# Patient Record
Sex: Male | Born: 1940 | Race: White | Hispanic: No | Marital: Married | Smoking: Former smoker
Health system: Southern US, Community
[De-identification: ages and names within clinical notes are randomized; demographics above are authoritative.]

## PROBLEM LIST (undated history)

## (undated) DIAGNOSIS — Z87898 Personal history of other specified conditions: Secondary | ICD-10-CM

## (undated) DIAGNOSIS — E785 Hyperlipidemia, unspecified: Secondary | ICD-10-CM

## (undated) DIAGNOSIS — J189 Pneumonia, unspecified organism: Secondary | ICD-10-CM

## (undated) DIAGNOSIS — Z9861 Coronary angioplasty status: Secondary | ICD-10-CM

## (undated) DIAGNOSIS — I2510 Atherosclerotic heart disease of native coronary artery without angina pectoris: Secondary | ICD-10-CM

## (undated) DIAGNOSIS — F0390 Unspecified dementia without behavioral disturbance: Secondary | ICD-10-CM

## (undated) DIAGNOSIS — I10 Essential (primary) hypertension: Secondary | ICD-10-CM

## (undated) DIAGNOSIS — I714 Abdominal aortic aneurysm, without rupture: Secondary | ICD-10-CM

## (undated) DIAGNOSIS — I6529 Occlusion and stenosis of unspecified carotid artery: Secondary | ICD-10-CM

## (undated) DIAGNOSIS — I482 Chronic atrial fibrillation, unspecified: Secondary | ICD-10-CM

## (undated) DIAGNOSIS — I213 ST elevation (STEMI) myocardial infarction of unspecified site: Secondary | ICD-10-CM

## (undated) DIAGNOSIS — I110 Hypertensive heart disease with heart failure: Secondary | ICD-10-CM

## (undated) DIAGNOSIS — Z86010 Personal history of colonic polyps: Secondary | ICD-10-CM

## (undated) DIAGNOSIS — I5032 Chronic diastolic (congestive) heart failure: Secondary | ICD-10-CM

## (undated) DIAGNOSIS — Z87891 Personal history of nicotine dependence: Secondary | ICD-10-CM

## (undated) DIAGNOSIS — K5790 Diverticulosis of intestine, part unspecified, without perforation or abscess without bleeding: Secondary | ICD-10-CM

## (undated) DIAGNOSIS — N183 Chronic kidney disease, stage 3 unspecified: Secondary | ICD-10-CM

## (undated) DIAGNOSIS — E119 Type 2 diabetes mellitus without complications: Secondary | ICD-10-CM

## (undated) DIAGNOSIS — J449 Chronic obstructive pulmonary disease, unspecified: Secondary | ICD-10-CM

## (undated) HISTORY — DX: Personal history of colonic polyps: Z86.010

## (undated) HISTORY — DX: Personal history of nicotine dependence: Z87.891

## (undated) HISTORY — PX: CATARACT EXTRACTION: SUR2

## (undated) HISTORY — DX: Personal history of other specified conditions: Z87.898

## (undated) HISTORY — DX: Abdominal aortic aneurysm, without rupture: I71.4

## (undated) HISTORY — DX: Chronic diastolic (congestive) heart failure: I50.32

## (undated) HISTORY — DX: Hyperlipidemia, unspecified: E78.5

## (undated) HISTORY — DX: Occlusion and stenosis of unspecified carotid artery: I65.29

## (undated) HISTORY — DX: Diverticulosis of intestine, part unspecified, without perforation or abscess without bleeding: K57.90

## (undated) HISTORY — PX: ORIF SHOULDER FRACTURE: SHX5035

## (undated) HISTORY — DX: Type 2 diabetes mellitus without complications: E11.9

## (undated) HISTORY — DX: Hypertensive heart disease with heart failure: I11.0

## (undated) HISTORY — PX: COLONOSCOPY: SHX174

---

## 1998-06-28 ENCOUNTER — Encounter: Payer: Self-pay | Admitting: Emergency Medicine

## 1998-06-28 ENCOUNTER — Emergency Department (HOSPITAL_COMMUNITY): Admission: EM | Admit: 1998-06-28 | Discharge: 1998-06-28 | Payer: Self-pay | Admitting: Emergency Medicine

## 2000-04-29 ENCOUNTER — Inpatient Hospital Stay (HOSPITAL_COMMUNITY): Admission: EM | Admit: 2000-04-29 | Discharge: 2000-05-01 | Payer: Self-pay | Admitting: Emergency Medicine

## 2000-04-29 ENCOUNTER — Encounter: Payer: Self-pay | Admitting: Emergency Medicine

## 2003-12-18 ENCOUNTER — Ambulatory Visit: Payer: Self-pay | Admitting: Internal Medicine

## 2004-04-17 ENCOUNTER — Ambulatory Visit: Payer: Self-pay | Admitting: Internal Medicine

## 2005-02-02 DIAGNOSIS — I213 ST elevation (STEMI) myocardial infarction of unspecified site: Secondary | ICD-10-CM

## 2005-02-02 HISTORY — PX: CORONARY ANGIOPLASTY WITH STENT PLACEMENT: SHX49

## 2005-02-02 HISTORY — DX: ST elevation (STEMI) myocardial infarction of unspecified site: I21.3

## 2005-04-09 ENCOUNTER — Ambulatory Visit: Payer: Self-pay | Admitting: Cardiology

## 2005-04-09 ENCOUNTER — Inpatient Hospital Stay (HOSPITAL_COMMUNITY): Admission: EM | Admit: 2005-04-09 | Discharge: 2005-04-12 | Payer: Self-pay | Admitting: Emergency Medicine

## 2005-04-29 ENCOUNTER — Ambulatory Visit: Payer: Self-pay | Admitting: Cardiology

## 2005-05-14 ENCOUNTER — Encounter (HOSPITAL_COMMUNITY): Admission: RE | Admit: 2005-05-14 | Discharge: 2005-07-03 | Payer: Self-pay | Admitting: Cardiology

## 2005-05-26 ENCOUNTER — Ambulatory Visit: Payer: Self-pay | Admitting: Cardiology

## 2005-06-12 ENCOUNTER — Ambulatory Visit: Payer: Self-pay

## 2005-06-12 ENCOUNTER — Encounter: Payer: Self-pay | Admitting: Cardiovascular Disease

## 2005-06-24 ENCOUNTER — Ambulatory Visit: Payer: Self-pay | Admitting: Cardiology

## 2005-08-19 ENCOUNTER — Ambulatory Visit: Payer: Self-pay

## 2005-10-16 ENCOUNTER — Ambulatory Visit: Payer: Self-pay | Admitting: Internal Medicine

## 2005-10-26 ENCOUNTER — Ambulatory Visit: Payer: Self-pay | Admitting: Internal Medicine

## 2005-11-01 DIAGNOSIS — I2510 Atherosclerotic heart disease of native coronary artery without angina pectoris: Secondary | ICD-10-CM

## 2005-11-01 HISTORY — DX: Atherosclerotic heart disease of native coronary artery without angina pectoris: I25.10

## 2006-03-09 ENCOUNTER — Ambulatory Visit: Payer: Self-pay | Admitting: Internal Medicine

## 2006-03-30 ENCOUNTER — Ambulatory Visit: Payer: Self-pay | Admitting: Internal Medicine

## 2006-04-13 ENCOUNTER — Ambulatory Visit: Payer: Self-pay | Admitting: Cardiology

## 2006-09-20 ENCOUNTER — Encounter (INDEPENDENT_AMBULATORY_CARE_PROVIDER_SITE_OTHER): Payer: Self-pay

## 2006-10-29 DIAGNOSIS — I10 Essential (primary) hypertension: Secondary | ICD-10-CM

## 2006-10-29 DIAGNOSIS — E1151 Type 2 diabetes mellitus with diabetic peripheral angiopathy without gangrene: Secondary | ICD-10-CM

## 2006-10-29 DIAGNOSIS — E785 Hyperlipidemia, unspecified: Secondary | ICD-10-CM

## 2006-10-29 DIAGNOSIS — E119 Type 2 diabetes mellitus without complications: Secondary | ICD-10-CM

## 2006-10-29 HISTORY — DX: Type 2 diabetes mellitus without complications: E11.9

## 2006-10-29 HISTORY — DX: Hyperlipidemia, unspecified: E78.5

## 2006-11-01 ENCOUNTER — Encounter: Payer: Self-pay | Admitting: Internal Medicine

## 2006-11-02 DIAGNOSIS — I2510 Atherosclerotic heart disease of native coronary artery without angina pectoris: Secondary | ICD-10-CM | POA: Insufficient documentation

## 2006-11-02 DIAGNOSIS — Z860100 Personal history of colon polyps, unspecified: Secondary | ICD-10-CM

## 2006-11-02 DIAGNOSIS — Z9861 Coronary angioplasty status: Secondary | ICD-10-CM

## 2006-11-02 DIAGNOSIS — I252 Old myocardial infarction: Secondary | ICD-10-CM | POA: Insufficient documentation

## 2006-11-02 HISTORY — DX: Personal history of colonic polyps: Z86.010

## 2006-11-02 HISTORY — DX: Personal history of colon polyps, unspecified: Z86.0100

## 2007-02-16 ENCOUNTER — Ambulatory Visit: Payer: Self-pay | Admitting: Internal Medicine

## 2007-02-16 LAB — CONVERTED CEMR LAB
ALT: 28 units/L (ref 0–53)
AST: 22 units/L (ref 0–37)
Albumin: 3.9 g/dL (ref 3.5–5.2)
Alkaline Phosphatase: 67 units/L (ref 39–117)
BUN: 18 mg/dL (ref 6–23)
Basophils Absolute: 0.1 10*3/uL (ref 0.0–0.1)
Basophils Relative: 0.7 % (ref 0.0–1.0)
Bilirubin Urine: NEGATIVE
Bilirubin, Direct: 0.1 mg/dL (ref 0.0–0.3)
CO2: 30 meq/L (ref 19–32)
Calcium: 9.8 mg/dL (ref 8.4–10.5)
Chloride: 102 meq/L (ref 96–112)
Cholesterol: 142 mg/dL (ref 0–200)
Creatinine, Ser: 1.2 mg/dL (ref 0.4–1.5)
Creatinine,U: 167.6 mg/dL
Eosinophils Absolute: 0.2 10*3/uL (ref 0.0–0.6)
Eosinophils Relative: 2.7 % (ref 0.0–5.0)
GFR calc Af Amer: 78 mL/min
GFR calc non Af Amer: 64 mL/min
Glucose, Bld: 121 mg/dL — ABNORMAL HIGH (ref 70–99)
Glucose, Urine, Semiquant: NEGATIVE
HCT: 44.1 % (ref 39.0–52.0)
HDL: 30.9 mg/dL — ABNORMAL LOW (ref >39.0)
Hemoglobin: 15.2 g/dL (ref 13.0–17.0)
Hgb A1c MFr Bld: 6.9 % — ABNORMAL HIGH (ref 4.6–6.0)
Ketones, urine, test strip: NEGATIVE
LDL Cholesterol: 99 mg/dL (ref 0–99)
Lymphocytes Relative: 37.4 % (ref 12.0–46.0)
MCHC: 34.5 g/dL (ref 30.0–36.0)
MCV: 86.6 fL (ref 78.0–100.0)
Microalb Creat Ratio: 10.1 mg/g (ref 0.0–30.0)
Microalb, Ur: 1.7 mg/dL (ref 0.0–1.9)
Monocytes Absolute: 0.7 10*3/uL (ref 0.2–0.7)
Monocytes Relative: 8.2 % (ref 3.0–11.0)
Neutro Abs: 4.6 10*3/uL (ref 1.4–7.7)
Neutrophils Relative %: 51 % (ref 43.0–77.0)
Nitrite: NEGATIVE
PSA: 0.45 ng/mL (ref 0.10–4.00)
Platelets: 221 10*3/uL (ref 150–400)
Potassium: 4.3 meq/L (ref 3.5–5.1)
Protein, U semiquant: NEGATIVE
RBC: 5.09 M/uL (ref 4.22–5.81)
RDW: 12.4 % (ref 11.5–14.6)
Sodium: 140 meq/L (ref 135–145)
Specific Gravity, Urine: 1.025
TSH: 1.86 microintl units/mL (ref 0.35–5.50)
Total Bilirubin: 0.5 mg/dL (ref 0.3–1.2)
Total CHOL/HDL Ratio: 4.6
Total Protein: 6.9 g/dL (ref 6.0–8.3)
Triglycerides: 62 mg/dL (ref 0–149)
Urobilinogen, UA: 0.2
VLDL: 12 mg/dL (ref 0–40)
WBC Urine, dipstick: NEGATIVE
WBC: 9 10*3/uL (ref 4.5–10.5)
pH: 5.5

## 2007-02-24 ENCOUNTER — Ambulatory Visit: Payer: Self-pay | Admitting: Internal Medicine

## 2007-02-24 DIAGNOSIS — Z87898 Personal history of other specified conditions: Secondary | ICD-10-CM

## 2007-02-24 HISTORY — DX: Personal history of other specified conditions: Z87.898

## 2007-05-19 ENCOUNTER — Ambulatory Visit: Payer: Self-pay | Admitting: Internal Medicine

## 2007-05-19 LAB — CONVERTED CEMR LAB: Blood Glucose, Fingerstick: 134

## 2007-06-02 ENCOUNTER — Ambulatory Visit: Payer: Self-pay | Admitting: Internal Medicine

## 2007-06-02 LAB — CONVERTED CEMR LAB: Blood Glucose, Fingerstick: 94

## 2007-09-01 ENCOUNTER — Ambulatory Visit: Payer: Self-pay

## 2007-09-01 ENCOUNTER — Ambulatory Visit: Payer: Self-pay | Admitting: Cardiology

## 2007-09-23 ENCOUNTER — Ambulatory Visit: Payer: Self-pay

## 2007-09-28 ENCOUNTER — Ambulatory Visit: Payer: Self-pay | Admitting: Cardiology

## 2007-09-28 LAB — CONVERTED CEMR LAB
Basophils Absolute: 0 10*3/uL (ref 0.0–0.1)
CO2: 29 meq/L (ref 19–32)
Calcium: 9.4 mg/dL (ref 8.4–10.5)
Chloride: 107 meq/L (ref 96–112)
Glucose, Bld: 132 mg/dL — ABNORMAL HIGH (ref 70–99)
Hemoglobin: 15.9 g/dL (ref 13.0–17.0)
INR: 1.1 — ABNORMAL HIGH (ref 0.8–1.0)
Lymphocytes Relative: 33.1 % (ref 12.0–46.0)
MCHC: 35.7 g/dL (ref 30.0–36.0)
Monocytes Relative: 7.2 % (ref 3.0–12.0)
Neutro Abs: 5.5 10*3/uL (ref 1.4–7.7)
Neutrophils Relative %: 58.1 % (ref 43.0–77.0)
Potassium: 3.6 meq/L (ref 3.5–5.1)
Prothrombin Time: 12.7 s (ref 10.9–13.3)
RDW: 12.4 % (ref 11.5–14.6)
Sodium: 140 meq/L (ref 135–145)
aPTT: 30.4 s — ABNORMAL HIGH (ref 21.7–29.8)

## 2007-10-03 ENCOUNTER — Inpatient Hospital Stay (HOSPITAL_BASED_OUTPATIENT_CLINIC_OR_DEPARTMENT_OTHER): Admission: RE | Admit: 2007-10-03 | Discharge: 2007-10-03 | Payer: Self-pay | Admitting: Internal Medicine

## 2007-10-03 ENCOUNTER — Ambulatory Visit: Payer: Self-pay | Admitting: Internal Medicine

## 2007-10-03 ENCOUNTER — Ambulatory Visit: Payer: Self-pay | Admitting: Cardiovascular Disease

## 2007-10-03 ENCOUNTER — Inpatient Hospital Stay (HOSPITAL_COMMUNITY): Admission: AD | Admit: 2007-10-03 | Discharge: 2007-10-04 | Payer: Self-pay | Admitting: Cardiovascular Disease

## 2007-10-06 ENCOUNTER — Ambulatory Visit: Payer: Self-pay | Admitting: Cardiology

## 2007-10-06 LAB — CONVERTED CEMR LAB
CO2: 28 meq/L (ref 19–32)
Calcium: 8.9 mg/dL (ref 8.4–10.5)
Chloride: 107 meq/L (ref 96–112)
Glucose, Bld: 152 mg/dL — ABNORMAL HIGH (ref 70–99)
Potassium: 4 meq/L (ref 3.5–5.1)
Sodium: 142 meq/L (ref 135–145)

## 2007-10-12 ENCOUNTER — Ambulatory Visit: Payer: Self-pay | Admitting: Internal Medicine

## 2007-10-27 ENCOUNTER — Ambulatory Visit: Payer: Self-pay | Admitting: Cardiology

## 2008-07-07 DIAGNOSIS — R0609 Other forms of dyspnea: Secondary | ICD-10-CM | POA: Insufficient documentation

## 2008-07-07 DIAGNOSIS — R0989 Other specified symptoms and signs involving the circulatory and respiratory systems: Secondary | ICD-10-CM | POA: Insufficient documentation

## 2008-07-13 ENCOUNTER — Ambulatory Visit: Payer: Self-pay | Admitting: Cardiology

## 2008-07-13 DIAGNOSIS — I714 Abdominal aortic aneurysm, without rupture, unspecified: Secondary | ICD-10-CM

## 2008-07-13 DIAGNOSIS — I6529 Occlusion and stenosis of unspecified carotid artery: Secondary | ICD-10-CM | POA: Insufficient documentation

## 2008-07-13 HISTORY — DX: Abdominal aortic aneurysm, without rupture: I71.4

## 2008-07-13 HISTORY — DX: Occlusion and stenosis of unspecified carotid artery: I65.29

## 2008-07-13 HISTORY — DX: Abdominal aortic aneurysm, without rupture, unspecified: I71.40

## 2008-10-09 ENCOUNTER — Telehealth: Payer: Self-pay | Admitting: Cardiology

## 2008-10-30 ENCOUNTER — Encounter (INDEPENDENT_AMBULATORY_CARE_PROVIDER_SITE_OTHER): Payer: Self-pay | Admitting: *Deleted

## 2008-11-22 ENCOUNTER — Telehealth (INDEPENDENT_AMBULATORY_CARE_PROVIDER_SITE_OTHER): Payer: Self-pay | Admitting: *Deleted

## 2008-11-26 ENCOUNTER — Ambulatory Visit: Payer: Self-pay | Admitting: Cardiology

## 2008-11-26 ENCOUNTER — Ambulatory Visit: Payer: Self-pay

## 2008-11-26 ENCOUNTER — Encounter (HOSPITAL_COMMUNITY): Admission: RE | Admit: 2008-11-26 | Discharge: 2009-02-01 | Payer: Self-pay | Admitting: Cardiology

## 2008-11-26 ENCOUNTER — Encounter: Payer: Self-pay | Admitting: Cardiology

## 2008-11-27 ENCOUNTER — Encounter (INDEPENDENT_AMBULATORY_CARE_PROVIDER_SITE_OTHER): Payer: Self-pay | Admitting: *Deleted

## 2008-11-30 ENCOUNTER — Ambulatory Visit: Payer: Self-pay | Admitting: Cardiology

## 2009-11-06 ENCOUNTER — Ambulatory Visit: Payer: Self-pay | Admitting: Internal Medicine

## 2009-11-06 ENCOUNTER — Encounter: Payer: Self-pay | Admitting: Internal Medicine

## 2009-11-06 DIAGNOSIS — I482 Chronic atrial fibrillation, unspecified: Secondary | ICD-10-CM

## 2009-11-06 HISTORY — DX: Chronic atrial fibrillation, unspecified: I48.20

## 2009-11-06 LAB — CONVERTED CEMR LAB
BUN: 24 mg/dL — ABNORMAL HIGH (ref 6–23)
Basophils Absolute: 0.1 10*3/uL (ref 0.0–0.1)
Bilirubin, Direct: 0.2 mg/dL (ref 0.0–0.3)
Blood Glucose, Fingerstick: 211
Cholesterol: 142 mg/dL (ref 0–200)
Creatinine, Ser: 1.4 mg/dL (ref 0.4–1.5)
Creatinine,U: 416 mg/dL
GFR calc non Af Amer: 54.76 mL/min (ref >60)
Glucose, Bld: 199 mg/dL — ABNORMAL HIGH (ref 70–99)
HCT: 49.1 % (ref 39.0–52.0)
Hgb A1c MFr Bld: 7 % — ABNORMAL HIGH (ref 4.6–6.5)
LDL Cholesterol: 73 mg/dL (ref 0–99)
Lymphs Abs: 3 10*3/uL (ref 0.7–4.0)
MCV: 88.1 fL (ref 78.0–100.0)
Microalb Creat Ratio: 34.2 mg/g — ABNORMAL HIGH (ref 0.0–30.0)
Microalb, Ur: 142.4 mg/dL — ABNORMAL HIGH (ref 0.0–1.9)
Monocytes Absolute: 0.8 10*3/uL (ref 0.1–1.0)
Monocytes Relative: 7.8 % (ref 3.0–12.0)
Neutrophils Relative %: 59.9 % (ref 43.0–77.0)
Platelets: 217 10*3/uL (ref 150.0–400.0)
Potassium: 4.7 meq/L (ref 3.5–5.1)
RDW: 14.3 % (ref 11.5–14.6)
TSH: 1.82 microintl units/mL (ref 0.35–5.50)
Total Bilirubin: 0.8 mg/dL (ref 0.3–1.2)
Triglycerides: 103 mg/dL (ref 0.0–149.0)
VLDL: 20.6 mg/dL (ref 0.0–40.0)
WBC: 10.1 10*3/uL (ref 4.5–10.5)

## 2009-11-11 ENCOUNTER — Ambulatory Visit: Payer: Self-pay | Admitting: Internal Medicine

## 2009-11-22 ENCOUNTER — Telehealth (INDEPENDENT_AMBULATORY_CARE_PROVIDER_SITE_OTHER): Payer: Self-pay | Admitting: *Deleted

## 2009-11-25 ENCOUNTER — Ambulatory Visit: Payer: Self-pay | Admitting: Internal Medicine

## 2009-11-27 ENCOUNTER — Encounter: Payer: Self-pay | Admitting: Physician Assistant

## 2009-11-27 ENCOUNTER — Ambulatory Visit: Payer: Self-pay | Admitting: Cardiovascular Disease

## 2009-12-02 ENCOUNTER — Ambulatory Visit: Payer: Self-pay | Admitting: Internal Medicine

## 2009-12-19 ENCOUNTER — Ambulatory Visit: Payer: Self-pay | Admitting: Internal Medicine

## 2009-12-19 DIAGNOSIS — M25519 Pain in unspecified shoulder: Secondary | ICD-10-CM | POA: Insufficient documentation

## 2009-12-25 ENCOUNTER — Ambulatory Visit: Payer: Self-pay

## 2009-12-25 ENCOUNTER — Encounter: Payer: Self-pay | Admitting: Cardiology

## 2009-12-25 ENCOUNTER — Ambulatory Visit: Payer: Self-pay | Admitting: Cardiovascular Disease

## 2009-12-25 ENCOUNTER — Ambulatory Visit (HOSPITAL_COMMUNITY): Admission: RE | Admit: 2009-12-25 | Discharge: 2009-12-25 | Payer: Self-pay | Admitting: Cardiology

## 2010-01-01 ENCOUNTER — Encounter: Payer: Self-pay | Admitting: Cardiology

## 2010-01-01 ENCOUNTER — Encounter: Payer: Self-pay | Admitting: Internal Medicine

## 2010-01-01 ENCOUNTER — Ambulatory Visit: Payer: Self-pay | Admitting: Cardiology

## 2010-01-08 ENCOUNTER — Encounter: Payer: Self-pay | Admitting: Cardiology

## 2010-01-08 ENCOUNTER — Encounter: Payer: Self-pay | Admitting: Internal Medicine

## 2010-01-13 ENCOUNTER — Inpatient Hospital Stay (HOSPITAL_COMMUNITY)
Admission: EM | Admit: 2010-01-13 | Discharge: 2010-01-15 | Payer: Self-pay | Source: Home / Self Care | Attending: Internal Medicine | Admitting: Internal Medicine

## 2010-01-14 ENCOUNTER — Telehealth: Payer: Self-pay | Admitting: Internal Medicine

## 2010-01-22 ENCOUNTER — Telehealth: Payer: Self-pay | Admitting: Internal Medicine

## 2010-01-23 ENCOUNTER — Encounter: Payer: Self-pay | Admitting: Internal Medicine

## 2010-01-30 ENCOUNTER — Encounter: Payer: Self-pay | Admitting: Cardiology

## 2010-02-10 ENCOUNTER — Ambulatory Visit
Admission: RE | Admit: 2010-02-10 | Discharge: 2010-02-10 | Payer: Self-pay | Source: Home / Self Care | Attending: Internal Medicine | Admitting: Internal Medicine

## 2010-02-10 ENCOUNTER — Other Ambulatory Visit: Payer: Self-pay | Admitting: Internal Medicine

## 2010-02-10 LAB — MICROALBUMIN / CREATININE URINE RATIO
Creatinine,U: 143.1 mg/dL
Microalb Creat Ratio: 3.7 mg/g (ref 0.0–30.0)
Microalb, Ur: 5.3 mg/dL — ABNORMAL HIGH (ref 0.0–1.9)

## 2010-02-10 LAB — HEMOGLOBIN A1C: Hgb A1c MFr Bld: 6 % (ref 4.6–6.5)

## 2010-03-04 NOTE — Assessment & Plan Note (Signed)
Summary: MED CK / REFILL  // RS   Vital Signs:  Patient profile:   70 year old male Weight:      199 pounds Temp:     98.0 degrees F oral BP sitting:   110 / 68  (right arm) Cuff size:   regular  Vitals Entered By: Duard Brady LPN (November 06, 2009 8:16 AM) CC: medication review    ***declines flu vAccine Is Patient Diabetic? Yes Did you bring your meter with you today? No CBG Result 211   Primary Care Provider:  [name]  MD  CC:  medication review    ***declines flu vAccine.  History of Present Illness: 70 year old patient who is not been seen in over one year.  He is followed by cardiology with a history of carotid and coronary artery stenosis.  He has type 2 diabetes, hypertension, and dyslipidemia.  There is no home blood sugar monitoring, although he does have a glucometer.  No new concerns or complaints today.  Denies any exertional chest pain.  Medical regimen includes Lipitor, which she continues to tolerate well.  He has been compliant with his medications.  Preventive Screening-Counseling & Management  Alcohol-Tobacco     Smoking Cessation Counseling: yes  Allergies (verified): No Known Drug Allergies  Past History:  Past Medical History: Reviewed history from 07/07/2008 and no changes required. 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 Cataract extraction PTCA/stent 3/07 colonoscopy November 2001 Stent for instent restenosis 09/2007  Family History: Reviewed history from 07/07/2008 and no changes required. father died age 68 acute  MI without a 49, acute MI Two brothers, one died of an MI at age 50  Review of Systems  The patient denies anorexia, fever, weight loss, weight gain, vision loss, decreased hearing, hoarseness, chest pain, syncope, dyspnea on exertion, peripheral edema, prolonged cough, headaches, hemoptysis, abdominal pain, melena, hematochezia, severe indigestion/heartburn, hematuria, incontinence, genital sores, muscle weakness, suspicious skin lesions, transient blindness, difficulty walking, depression, unusual weight change, abnormal bleeding, enlarged lymph nodes, angioedema, breast masses, and testicular masses.    Physical Exam  General:  overweight-appearing.  100/64overweight-appearing.  overweight-appearing.   Head:  Normocephalic and atraumatic without obvious abnormalities. No apparent alopecia or balding. Eyes:  No corneal or conjunctival inflammation noted. EOMI. Perrla. Funduscopic exam benign, without hemorrhages, exudates or papilledema. Vision grossly normal. Mouth:  Oral mucosa and oropharynx without lesions or exudates.  Teeth in good repair. Neck:  No deformities, masses, or tenderness noted. Lungs:  Normal respiratory effort, chest expands symmetrically. Lungs are clear to auscultation, no crackles or wheezes. Heart:  rhythm is irregular Abdomen:  Bowel sounds positive,abdomen soft and non-tender without masses, organomegaly or hernias noted. Msk:  No deformity or scoliosis noted of thoracic or lumbar spine.   Pulses:  pedal pulses not easily palpable Extremities:  No clubbing, cyanosis, edema, or deformity noted with normal full range of motion of all joints.   Skin:  Intact without suspicious lesions or rashes Cervical Nodes:  No lymphadenopathy noted Axillary Nodes:  No palpable lymphadenopathy  Diabetes Management Exam:    Eye Exam:       Eye Exam done here today  Results: normal   Impression & Recommendations:  Problem # 1:  CAROTID ARTERY STENOSIS, WITHOUT  INFARCTION (ICD-433.10)  The following medications were removed from the medication list:    Aspirin Ec 325 Mg Tbec (Aspirin) .Marland Kitchen... Take one tablet by mouth daily His updated medication list for this problem includes:    Plavix 75 Mg Tabs (Clopidogrel bisulfate) .Marland Kitchen... Take 1 tablet by mouth once a day    Warfarin Sodium 5 Mg Tabs (Warfarin sodium) ..... One daily  His updated medication list for this problem includes:    Plavix 75 Mg Tabs (Clopidogrel bisulfate) .Marland Kitchen... Take 1 tablet by mouth once a day    Aspirin Ec 325 Mg Tbec (Aspirin) .Marland Kitchen... Take one tablet by mouth daily  The following medications were removed from the medication list:    Aspirin Ec 325 Mg Tbec (Aspirin) .Marland Kitchen... Take one tablet by mouth daily His updated medication list for this problem includes:    Plavix 75 Mg Tabs (Clopidogrel bisulfate) .Marland Kitchen... Take 1 tablet by mouth once a day    Warfarin Sodium 5 Mg Tabs (Warfarin sodium) ..... One daily  Problem # 2:  HYPERTENSION (ICD-401.9)  The following medications were removed from the medication list:    Amlodipine Besylate 10 Mg Tabs (Amlodipine besylate) ..... One daily His updated medication list for this problem includes:    Altace 10 Mg Caps (Ramipril) .Marland Kitchen... 1 once daily    Hydrochlorothiazide 25 Mg Tabs (Hydrochlorothiazide) .Marland Kitchen... Take 1 tablet by mouth every morning    Metoprolol Tartrate 100 Mg Tabs (Metoprolol tartrate) .Marland Kitchen... Take 1 tablet by mouth twice a day    Diltiazem Hcl Coated Beads 120 Mg Xr24h-cap (Diltiazem hcl coated beads) ..... One daily    His updated medication list for this problem includes:    Altace 10 Mg Caps (Ramipril) .Marland Kitchen... 1 once daily    Hydrochlorothiazide 25 Mg Tabs (Hydrochlorothiazide) .Marland Kitchen... Take 1 tablet by mouth every morning    Metoprolol Tartrate 100 Mg Tabs (Metoprolol tartrate) .Marland Kitchen... Take 1 tablet by mouth twice a day    Amlodipine Besylate 10 Mg Tabs (Amlodipine besylate) ..... One daily    The following medications were  removed from the medication list:    Amlodipine Besylate 10 Mg Tabs (Amlodipine besylate) ..... One daily His updated medication list for this problem includes:    Altace 10 Mg Caps (Ramipril) .Marland Kitchen... 1 once daily    Hydrochlorothiazide 25 Mg Tabs (Hydrochlorothiazide) .Marland Kitchen... Take 1 tablet by mouth every morning    Metoprolol Tartrate 100 Mg Tabs (Metoprolol tartrate) .Marland Kitchen... Take 1 tablet by mouth twice a day    Diltiazem Hcl Coated Beads 120 Mg Xr24h-cap (Diltiazem hcl coated beads) ..... One daily  Orders: TLB-BMP (Basic Metabolic Panel-BMET) (80048-METABOL) TLB-CBC Platelet - w/Differential (85025-CBCD) TLB-Hepatic/Liver Function Pnl (80076-HEPATIC) TLB-TSH (Thyroid Stimulating Hormone) (84443-TSH) Specimen Handling (47829)  Problem # 3:  HYPERLIPIDEMIA (ICD-272.4)  His updated medication list for this problem includes:    Lipitor 20 Mg Tabs (Atorvastatin calcium) .Marland Kitchen... 1 tab once daily    His updated medication list for this problem includes:    Lipitor 20 Mg Tabs (Atorvastatin calcium) .Marland Kitchen... 1 tab once daily    His updated medication list for this problem includes:    Lipitor 20 Mg Tabs (Atorvastatin calcium) .Marland Kitchen... 1 tab once daily  Orders: Venipuncture (56213) TLB-Lipid Panel (80061-LIPID) TLB-BMP (Basic Metabolic Panel-BMET) (80048-METABOL) TLB-CBC Platelet - w/Differential (85025-CBCD) TLB-Hepatic/Liver Function Pnl (80076-HEPATIC) TLB-TSH (Thyroid Stimulating Hormone) (84443-TSH) Specimen Handling (08657)  Problem # 4:  DIABETES MELLITUS, TYPE II (ICD-250.00)  The following medications were removed from the medication list:    Aspirin Ec 325 Mg Tbec (Aspirin) .Marland Kitchen... Take one tablet by mouth daily His updated medication list for this problem includes:    Altace 10 Mg Caps (Ramipril) .Marland Kitchen... 1 once daily    Amaryl 4 Mg Tabs (Glimepiride) .Marland Kitchen... Take 1 tablet by mouth once a day    Metformin Hcl 1000 Mg Tabs (Metformin hcl) .Marland Kitchen... Take 1 tablet by mouth twice a  day  Orders: Capillary Blood Glucose/CBG ([address]) TLB-BMP (Basic Metabolic Panel-BMET) (80048-METABOL) TLB-CBC Platelet - w/Differential (85025-CBCD) TLB-Hepatic/Liver Function Pnl (80076-HEPATIC) TLB-TSH (Thyroid Stimulating Hormone) (84443-TSH) TLB-A1C / Hgb A1C (Glycohemoglobin) (83036-A1C) TLB-Microalbumin/Creat Ratio, Urine (82043-MALB) Specimen Handling (60454)  His updated medication list for this problem includes:    Altace 10 Mg Caps (Ramipril) .Marland Kitchen... 1 once daily    Amaryl 4 Mg Tabs (Glimepiride) .Marland Kitchen... Take 1 tablet by mouth once a day    Metformin Hcl 1000 Mg Tabs (Metformin hcl) .Marland Kitchen... Take 1 tablet by mouth twice a day    Aspirin Ec 325 Mg Tbec (Aspirin) .Marland Kitchen... Take one tablet by mouth daily  The following medications were removed from the medication list:    Aspirin Ec 325 Mg Tbec (Aspirin) .Marland Kitchen... Take one tablet by mouth daily His updated medication list for this problem includes:    Altace 10 Mg Caps (Ramipril) .Marland Kitchen... 1 once daily    Amaryl 4 Mg Tabs (Glimepiride) .Marland Kitchen... Take 1 tablet by mouth once a day    Metformin Hcl 1000 Mg Tabs (Metformin hcl) .Marland Kitchen... Take 1 tablet by mouth twice a day  Problem # 5:  ATRIAL FIBRILLATION (ICD-427.31)  The following medications were removed from the medication list:    Amlodipine Besylate 10 Mg Tabs (Amlodipine besylate) ..... One daily    Aspirin Ec 325 Mg Tbec (Aspirin) .Marland Kitchen... Take one tablet by mouth daily His updated medication list for this problem includes:    Metoprolol Tartrate 100 Mg Tabs (Metoprolol tartrate) .Marland Kitchen... Take 1 tablet by mouth twice a day    Plavix 75 Mg Tabs (Clopidogrel bisulfate) .Marland Kitchen... Take 1 tablet by mouth once a day    Diltiazem Hcl Coated Beads 120 Mg Xr24h-cap (Diltiazem hcl coated beads) ..... One daily    Warfarin Sodium 5 Mg Tabs (Warfarin sodium) ..... One daily    The following medications were removed from the medication list:    Amlodipine Besylate 10 Mg Tabs (Amlodipine besylate) ..... One daily     Aspirin Ec 325 Mg Tbec (Aspirin) .Marland Kitchen... Take one tablet by mouth daily His updated medication list for this problem includes:    Metoprolol Tartrate 100 Mg Tabs (Metoprolol tartrate) .Marland Kitchen... Take 1 tablet by mouth twice a day    Plavix 75 Mg Tabs (Clopidogrel bisulfate) .Marland Kitchen... Take 1 tablet by mouth once a day    Diltiazem Hcl Coated Beads 120 Mg Xr24h-cap (Diltiazem hcl coated beads) ..... One daily    Warfarin Sodium 5 Mg Tabs (Warfarin sodium) ..... One daily  Orders: Cardiology Referral (Cardiology) Specimen Handling (09811)  Problem # 6:  ANTICOAGULATION RX (ICD-V58.61)  Orders: Cardiology Referral (Cardiology) Specimen Handling (91478)  Complete Medication List: 1)  Altace 10 Mg Caps (Ramipril) .Marland Kitchen.. 1 once daily 2)  Amaryl 4 Mg Tabs (Glimepiride) .... Take 1 tablet by mouth once a day 3)  Hydrochlorothiazide 25 Mg Tabs (Hydrochlorothiazide) .... Take 1 tablet by mouth every morning 4)  Klor-con M20 20 Meq Tbcr (Potassium chloride crys  cr) .... 1 once daily 5)  Lipitor 20 Mg Tabs (Atorvastatin calcium) .Marland Kitchen.. 1 tab once daily 6)  Metformin Hcl 1000 Mg Tabs (Metformin hcl) .... Take 1 tablet by mouth twice a day 7)  Metoprolol Tartrate 100 Mg Tabs (Metoprolol tartrate) .... Take 1 tablet by mouth twice a day 8)  Nitroquick 0.4 Mg Subl (Nitroglycerin) .... As needed 9)  Plavix 75 Mg Tabs (Clopidogrel bisulfate) .... Take 1 tablet by mouth once a day 10)  Hemorrhoidal-hc 25 Mg Supp (Hydrocortisone acetate) .... Use as dir 11)  Diltiazem Hcl Coated Beads 120 Mg Xr24h-cap (Diltiazem hcl coated beads) .... One daily 12)  Warfarin Sodium 5 Mg Tabs (Warfarin sodium) .... One daily  Other Orders: TLB-PSA (Prostate Specific Antigen) (84153-PSA)  Patient Instructions: 1)  Please schedule a follow-up appointment in 3 months for CPX  2)  Tobacco is very bad for your health and your loved ones! You Should stop smoking!. 3)  It is important that you exercise regularly at least 20 minutes 5  times a week. If you develop chest pain, have severe difficulty breathing, or feel very tired , stop exercising immediately and seek medical attention. 4)  You need to lose weight. Consider a lower calorie diet and regular exercise.  5)  Schedule a colonoscopy/sigmoidoscopy to help detect colon cancer. 6)  Check your blood sugars regularly. If your readings are usually above : or below 70 you should contact our office. 7)  It is important that your Diabetic A1c level is checked every 3 months. 8)  See your eye doctor yearly to check for diabetic eye damage. 9)  Cardiology followup next week 10)  Pro time 5 days Prescriptions: WARFARIN SODIUM 5 MG TABS (WARFARIN SODIUM) one daily  #30 x 0   Entered and Authorized by:   [name]  MD   Signed by:   [name]  MD on 11/06/2009   Method used:   [facility]ron[facility] to        [facility]  [place] [address] (retail)       [address]       [place], [place]  [address]       Ph: [phone] or [phone]       Fax: [phone]   RxID:   [id number] DILTIAZEM HCL COATED BEADS 120 MG XR24H-CAP (DILTIAZEM HCL COATED BEADS) one daily  #90 x 0   Entered and Authorized by:   [name]  MD   Signed by:   [name]  MD on 11/06/2009   Method used:   [facility]        [facility]  [place] [address] (retail)       [address]       [place], [place]  [address]       Ph: [phone] or [phone]       Fax: [phone]   RxID:   [id number] AMLODIPINE BESYLATE 10 MG  TABS (AMLODIPINE BESYLATE) one daily  #90 Tablet x 4   Entered and Authorized by:   [name]  MD   Signed by:   [name]  MD on 11/06/2009   Method used:   [facility]        [facility]  [place] [address] (retail[facility]       [address]       [place], [place]  [address]       Ph: [phone] or [phone]  Fax: [phone]   RxID:   [id number] PLAVIX 75 MG TABS (CLOPIDOGREL BISULFATE) Take 1 tablet by mouth once a day   #90 Tablet x 4   Entered and Authorized by:   [name]  MD   Signed by:   [name]  MD on 11/06/2009   Method used:   [facility]        [facility]  [place] [address] (retail)       [address]       [place], [place]  [address]       Ph: [phone] or [phone]       Fax: [phone]   RxID:   [id number] METOPROLOL TARTRATE 100 MG TABS (METOPROLOL TARTRATE) Take 1 tablet by mouth twice a day  #90 Tablet x 4   Entered and Authorized by:   [name]  MD   Signed by:   [name]  MD on 11/06/2009   Method used:   [facility]        [facility]  [place] [address] ([facility]ail)       [address]       [place], [place]  [address]       Ph: [phone] or [phone]       Fax: [phone]   RxID:   [id number] METFORMIN HCL 1000 MG TABS (METFORMIN HCL) Take 1 tablet by mouth twice a day  #60 Tablet x 4   Entered and Authorized by:   [name]  MD   Signed by:   [name]  MD on 11/06/2009   Method used:   [facility]        [facility]  [place] [address] ([facility]ail)       [address]       [place], [place]  [address]       Ph: [phone] or [phone]       Fax: [phone]   RxID:   [id number] LIPITOR 20 MG TABS (ATORVASTATIN CALCIUM) 1 tab once daily  #30 x 4   Entered and Authorized by:   [name]  MD   Signed by:   [name]  MD on 11/06/2009   Method used:   [facility]        [facility]  [place] [address] ([facility]ail)       [address]       [place], [place]  [address]       Ph: [phone] or [phone]       Fax: [phone]   RxID:   [id number] KLOR-CON M20 20 MEQ TBCR (POTASSIUM CHLORIDE CRYS CR) 1 once daily  #90 Tablet x 4   Entered and Authorized by:   [name]  MD   Signed by:   [name]  MD on 11/06/2009   Method used:   [facility]        [facility]  [place] [address] ([facility]ail)       [address]       [place], [place]  [address]       Ph:  [phone] or [phone]       Fax: [phone]   RxID:   [id number] HYDROCHLOROTHIAZIDE 25 MG TABS (HYDROCHLOROTHIAZIDE) Take 1 tablet by mouth every morning  #90 Tablet x 4   Entered and Authorized by:   [name]  MD   Signed by:  [name]  MD on 11/06/2009   Method used:   [facility]        [facility]  [place] [address]       [address]       [place], [place]  [address]       Ph: [phone] or [phone]       Fax: [phone]   RxID:   [id number] AMARYL 4 MG TABS (GLIMEPIRIDE) Take 1 tablet by mouth once a day  #30 Tablet x 4   Entered and Authorized by:   [name]  MD   Signed by:   [name]  MD on 11/06/2009   Method used:   [facility]        [facility]  [place] [address] [facility]ail[facility]       [address]       [place], [place]  [address]       Ph: [phone] or [phone]       Fax: [phone]   RxID:   [id number] ALTACE 10 MG CAPS (RAMIPRIL) 1 once daily  #90 Capsule x 4   Entered and Authorized by:   [name]  MD   Signed by:   [name]  MD on 11/06/2009   Method used:   [facility]        [facility]  [place] [address] [facility]       [address]       [place], [place]  [address]       Ph: [phone] or [phone]       Fax: [phone]   RxID:   [id number]   Appended Document: MED CK / REFILL  // RS     Allergies: No Known Drug Allergies   Complete Medication List: 1)  Altace 10 Mg Caps (Ramipril) .Marland Kitchen.. 1 once daily 2)  Amaryl 4 Mg Tabs (Glimepiride) .... Take 1 tablet by mouth once a day 3)  Hydrochlorothiazide 25 Mg Tabs (Hydrochlorothiazide) .... Take 1 tablet by mouth every morning 4)  Klor-con M20 20 Meq Tbcr (Potassium chloride crys cr) .Marland Kitchen.. 1 once daily 5)  Lipitor 20 Mg Tabs (Atorvastatin calcium) .Marland Kitchen.. 1 tab once daily 6)  Metformin Hcl 1000 Mg Tabs (Metformin hcl) .... Take 1 tablet by mouth twice a day 7)  Metoprolol Tartrate 100 Mg Tabs (Metoprolol  tartrate) .... Take 1 tablet by mouth twice a day 8)  Nitroquick 0.4 Mg Subl (Nitroglycerin) .... As needed 9)  Plavix 75 Mg Tabs (Clopidogrel bisulfate) .... Take 1 tablet by mouth once a day 10)  Hemorrhoidal-hc 25 Mg Supp (Hydrocortisone acetate) .... Use as dir 11)  Diltiazem Hcl Coated Beads 120 Mg Xr24h-cap (Diltiazem hcl coated beads) .... One daily 12)  Warfarin Sodium 5 Mg Tabs (Warfarin sodium) .... One daily  Other Orders: EKG w/ Interpretation (93000)

## 2010-03-04 NOTE — Letter (Signed)
Summary: [facility]  [facility]   Imported By: Maryln Gottron 01/07/2010 12:58:22  _____________________________________________________________________  External Attachment:    Type:   Image     Comment:   External Document

## 2010-03-04 NOTE — Letter (Signed)
Summary: Clearance Letter  [facility], Main Office  [address]   [place], [place] [address]   Phone: [phone]  Fax: [phone]    January 01, 2010  Re:     Jason Fisher Address:   [address]     [place], [place]  [address] DOB:     [birth year] MRN:     [id number]   To whom it may concern: [facility]     Mr. Jason Fisher is a cardiac patient of ours.  From a cardiac standpoint he is at a low risk for surgery and is clear for surgery. If you have any further questions please call our office at [phone].      Sincerely,    Dr. Valera Castle Lisabeth Devoid RN

## 2010-03-04 NOTE — Progress Notes (Signed)
  Phone Note Outgoing Call   Call placed by: Rita Ohara Call placed to: Patient Summary of Call: Called patient to remind him he need his INR checked but the phone number is incorrect.

## 2010-03-04 NOTE — Assessment & Plan Note (Signed)
Summary: rov/afib    Visit Type:  Follow-up Primary Jason Fisher:  Jason Savers  MD  CC:  no complaints.  History of Present Illness: This is a 70 year old white male patient who was referred to [place] by Dr. Amador Cunas after he was found to be in atrial fibrillation when he was seen November 06, 2009. The patient was placed on Coumadin and Cardizem. Unfortunately the patient fell out of the back of a truck about 4 feet and fell and was quite bruised up. He was hospitalized in [place] and had a full body CT that showed no broken bones. They stopped his aspirin I reduced his Coumadin. He is being followed at the breast filled office for his INRs.  The patient denies any palpitations, chest pain, dizziness, dyspnea, dyspnea on exertion, or presyncope. He does not know that he was in atrial fibrillation.  Current Medications (verified): 1)  Altace 10 Mg Caps (Ramipril) .Marland Kitchen.. 1 Once Daily 2)  Amaryl 4 Mg Tabs (Glimepiride) .... Take 1 Tablet By Mouth Once A Day 3)  Hydrochlorothiazide 25 Mg Tabs (Hydrochlorothiazide) .... Take 1 Tablet By Mouth Every Morning 4)  Klor-Con M20 20 Meq Tbcr (Potassium Chloride Crys Cr) .Marland Kitchen.. 1 Once Daily 5)  Lipitor 20 Mg Tabs (Atorvastatin Calcium) .Marland Kitchen.. 1 Tab Once Daily 6)  Metformin Hcl 1000 Mg Tabs (Metformin Hcl) .... Take 1 Tablet By Mouth Twice A Day 7)  Metoprolol Tartrate 100 Mg Tabs (Metoprolol Tartrate) .... Take 1 Tablet By Mouth Twice A Day 8)  Nitroquick 0.4 Mg Subl (Nitroglycerin) .... As Needed 9)  Plavix 75 Mg Tabs (Clopidogrel Bisulfate) .... Take 1 Tablet By Mouth Once A Day 10)  Hemorrhoidal-Hc 25 Mg  Supp (Hydrocortisone Acetate) .... Use As Dir 11)  Diltiazem Hcl Coated Beads 120 Mg Xr24h-Cap (Diltiazem Hcl Coated Beads) .... One Daily 12)  Warfarin Sodium 5 Mg Tabs (Warfarin Sodium) .... 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 He has bruises over his body.  Vital Signs:  Patient profile:   70 year old male Height:      70 inches Weight:      197 pounds BMI:     28.37 Pulse rate:   72 / minute Pulse rhythm:   regular BP sitting:   128 / 78  (left arm)  Vitals Entered By: Jacquelin Hawking, CMA (November 27, 2009 8:27 AM)  Physical Exam  General:   Well-nournished, in no acute distress. Neck: No JVD, HJR, Bruit, or thyroid enlargement Lungs: No tachypnea, clear without wheezing, rales, or rhonchi Cardiovascular:Patient has significant bruising on his right chest under his right arm secondary to the fall, RRR with occasional skipping, PMI not displaced, heart sounds normal, no murmurs, gallops, bruit, thrill, or heave. Abdomen: BS normal. Soft without organomegaly, masses, lesions or tenderness. Extremities: without cyanosis, clubbing or edema. Good distal pulses bilateral SKin: Warm, no lesions or rashes  Musculoskeletal: No deformities Neuro: no focal signs    EKG  Procedure date:  11/27/2009  Findings:      EKG today demonstrates normal sinus rhythm with occasional PACs  Impression & Recommendations:  Problem # 1:  ATRIAL FIBRILLATION (ICD-427.31) Patient was found to be in atrial fibrillation November 06, 2009 and was placed on Coumadin and Cardizem. The patient was asymptomatic with this. He is currently in normal sinus rhythm with PACs. We will check a 2-D echo, continue his Cardizem and Coumadin for now. His updated medication list for this  problem includes:    Metoprolol Tartrate 100 Mg Tabs (Metoprolol tartrate) .Marland Kitchen... Take 1 tablet by mouth twice a day    Plavix 75 Mg Tabs (Clopidogrel bisulfate) .Marland Kitchen... Take 1 tablet by mouth once a day    Warfarin Sodium 5 Mg Tabs (Warfarin sodium) ..... One daily  Orders: Echocardiogram (Echo)  Problem # 2:  ACCIDENTAL FALL FROM/OUT BUILDING/OTH STRUCTURE (ICD-E882) Patient had an accidental fall from the back of a truck and fell about 4 feet. He is very bruised and sore all over from this. He was hospitalized in [place] and apparently has no broken bones. His Coumadin was adjusted and his aspirin was stopped. We will resume his aspirin once most his bruising has resolved.  Problem # 3:  CAROTID STENOSIS (ICD-433.10) Patient is due for his yearly followup carotid Dopplers His updated medication list for this problem includes:    Plavix 75 Mg Tabs (Clopidogrel bisulfate) .Marland Kitchen... Take 1 tablet by mouth once a day    Warfarin Sodium 5 Mg Tabs (Warfarin sodium) ..... One daily  Orders: Carotid Duplex (Carotid Duplex) Echocardiogram (Echo)  Problem # 4:  ABDOMINAL AORTIC ANEURYSM (ICD-441.4) Patient is due for his yearly followup on his small infrarenal abdominal aortic aneurysm  Problem # 5:  CORONARY ARTERY DISEASE (ICD-414.00) Patient has history of coronary artery disease status post inferior wall MI treated with stent to the RCA in March 2007. In August 2009 he had another stent placed in the RCA for in-stent restenosis. Most recent adenosine Myoview in October 2010 showed fixed basal and mid inferior defect with hypokinesis but no ischemia ejection fraction 45%. Patient denies any further chest pain. His updated medication list for this problem includes:    Altace 10 Mg Caps (Ramipril) .Marland Kitchen... 1 once daily    Metoprolol Tartrate 100 Mg Tabs (Metoprolol tartrate) .Marland Kitchen... Take 1 tablet by mouth twice a day    Nitroquick 0.4 Mg Subl (Nitroglycerin) .Marland Kitchen... As needed    Plavix 75 Mg Tabs  (Clopidogrel bisulfate) .Marland Kitchen... Take 1 tablet by mouth once a day    Diltiazem Hcl Coated Beads 120 Mg Xr24h-cap (Diltiazem hcl coated beads) ..... One daily    Warfarin Sodium 5 Mg Tabs (Warfarin sodium) ..... One daily  Other Orders: Abdominal Aorta Duplex (Abd Aorta Duplex)  Patient Instructions: 1)  Your physician has requested that you have an echocardiogram.  Echocardiography is a painless test that uses sound waves to create images of your heart. It provides your doctor with information about the size and shape of your heart and how well your heart's chambers and valves are working.  This procedure takes approximately one hour. There are no restrictions for this procedure. 2)  Your physician has requested that you have a carotid duplex. This test is an ultrasound of the carotid arteries in your neck. It looks at blood flow through these arteries that supply the brain with blood. Allow one hour for this exam. There are no restrictions or special instructions. 3)  Your physician has requested that you have an  abdominal aorta duplex. During this test, an ultrasound is used to evaluate the aorta. Allow 30 minutes for this exam. Do not eat after midnight the day before and avoid carbonated beverages. There are no restrictions or special instructions. 4)  Your physician recommends that you schedule a follow-up appointment in: 1 month with Dr. Daleen Squibb.

## 2010-03-04 NOTE — Assessment & Plan Note (Signed)
Summary: pt fell last month/hand is swollen/shoulder pain/   Vital Signs:  Patient profile:   70 year old male Weight:      199 pounds BP sitting:   118 / 80  (left arm) Cuff size:   regular  Vitals Entered By: Duard Brady LPN (December 19, 2009 10:20 AM) CC: c/o (R) arm pain and swelling - injury to arm 1 mos ago Is Patient Diabetic? Yes Did you bring your meter with you today? No CBG Result 162   Primary Care Provider:  [name]  MD  CC:  c/o (R) arm pain and swelling - injury to arm 1 mos ago.  History of Present Illness: 70 year old patient who is seen today.  Approximately 3 weeks following a work-related injury when he fell off a truck sustaining trauma to his right shoulder.  He was evaluated at a local ER in [place] where x-rays were normal.  He has had ongoing pain which has interfered with sleep and has suffered diminished range of motion.  He has chronic atrial fibrillation  on chronic Coumadin anticoagulation.  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 March 2007 DYSPNEA ON EXERTION (ICD-786.09) HYPERTENSION (ICD-401.9) HYPERLIPIDEMIA (ICD-272.4) DIABETES MELLITUS, TYPE II (ICD-250.00) BENIGN PROSTATIC HYPERTROPHY, HX OF (ICD-V13.8) COLONIC POLYPS, HX OF (ICD-V12.72)   Atrial fibrillation  Review of Systems  The patient denies anorexia, fever, weight loss, weight gain, vision loss, decreased hearing, hoarseness, chest pain, syncope, dyspnea on exertion, peripheral edema, prolonged cough, headaches, hemoptysis, abdominal pain, melena, hematochezia, severe indigestion/heartburn, hematuria, incontinence, genital sores, muscle weakness, suspicious skin lesions, transient blindness, difficulty walking, depression, unusual  weight change, abnormal bleeding, enlarged lymph nodes, angioedema, breast masses, and testicular masses.    Physical Exam  General:  overweight-appearing.  160/90overweight-appearing.   Head:  Normocephalic and atraumatic without obvious abnormalities. No apparent alopecia or balding. Mouth:  Oral mucosa and oropharynx without lesions or exudates.  Teeth in good repair. Neck:  No deformities, masses, or tenderness noted. Lungs:  Normal respiratory effort, chest expands symmetrically. Lungs are clear to auscultation, no crackles or wheezes. Heart:  irregular rhythm with a rate of 90-100 Msk:  considerable soft tissue swelling about the anterior aspect of the right shoulder.  Extensive ecchymoses were noted involving the upper arm and elbow region; he had essentially a frozen shoulder and unable to move due to pain and soft tissue swelling; diminished grip strength on the right possibly  secondary to the discomfort   Impression & Recommendations:  Problem # 1:  SHOULDER PAIN, RIGHT (ICD-719.41)  His updated medication list for this problem includes:    Hydrocodone-acetaminophen 5-500 Mg Tabs (Hydrocodone-acetaminophen) ..... One or two every 6 hours as needed for pain rule out torn rotator cuff, dislocation, etc.    His updated medication list for this problem includes:    Hydrocodone-acetaminophen 5-500 Mg Tabs (Hydrocodone-acetaminophen) ..... One or two every 6 hours as needed for pain  Problem # 2:  ANTICOAGULATION RX (ICD-V58.61)  Problem # 3:  ATRIAL FIBRILLATION (ICD-427.31)  His updated medication list for this problem includes:    Metoprolol Tartrate 100 Mg Tabs (Metoprolol tartrate) .Marland Kitchen... Take 1 tablet by mouth twice a day    Plavix 75 Mg Tabs (Clopidogrel bisulfate) .Marland Kitchen... Take 1 tablet by mouth once a day  Diltiazem Hcl Coated Beads 120 Mg Xr24h-cap (Diltiazem hcl coated beads) ..... One daily    Warfarin Sodium 5 Mg Tabs (Warfarin sodium) ..... One daily will increase  diltiazem to 240 mg daily until evaluated by cardiology  His updated medication list for this problem includes:    Metoprolol Tartrate 100 Mg Tabs (Metoprolol tartrate) .Marland Kitchen... Take 1 tablet by mouth twice a day    Plavix 75 Mg Tabs (Clopidogrel bisulfate) .Marland Kitchen... Take 1 tablet by mouth once a day    Diltiazem Hcl Coated Beads 120 Mg Xr24h-cap (Diltiazem hcl coated beads) ..... One daily    Warfarin Sodium 5 Mg Tabs (Warfarin sodium) ..... One daily  Complete Medication List: 1)  Altace 10 Mg Caps (Ramipril) .Marland Kitchen.. 1 once daily 2)  Amaryl 4 Mg Tabs (Glimepiride) .... Take 1 tablet by mouth once a day 3)  Hydrochlorothiazide 25 Mg Tabs (Hydrochlorothiazide) .... Take 1 tablet by mouth every morning 4)  Klor-con M20 20 Meq Tbcr (Potassium chloride crys cr) .Marland Kitchen.. 1 once daily 5)  Lipitor 20 Mg Tabs (Atorvastatin calcium) .Marland Kitchen.. 1 tab once daily 6)  Metformin Hcl 1000 Mg Tabs (Metformin hcl) .... Take 1 tablet by mouth twice a day 7)  Metoprolol Tartrate 100 Mg Tabs (Metoprolol tartrate) .... Take 1 tablet by mouth twice a day 8)  Nitroquick 0.4 Mg Subl (Nitroglycerin) .... As needed 9)  Plavix 75 Mg Tabs (Clopidogrel bisulfate) .... Take 1 tablet by mouth once a day 10)  Hemorrhoidal-hc 25 Mg Supp (Hydrocortisone acetate) .... Use as dir 11)  Diltiazem Hcl Coated Beads 120 Mg Xr24h-cap (Diltiazem hcl coated beads) .... One daily 12)  Warfarin Sodium 5 Mg Tabs (Warfarin sodium) .... One daily 13)  Hydrocodone-acetaminophen 5-500 Mg Tabs (Hydrocodone-acetaminophen) .... One or two every 6 hours as needed for pain  Other Orders: Capillary Blood Glucose/CBG (16109) Orthopedic Referral (Ortho)  Patient Instructions: 1)  Limit your Sodium (Salt). 2)  orthopedic consultation as scheduled 3)  cardiology follow-up as scheduled 4)  increase diltiazem to two daily until seen by cardiology Prescriptions: HYDROCODONE-ACETAMINOPHEN 5-500 MG TABS (HYDROCODONE-ACETAMINOPHEN) one or two every 6 hours as needed  for pain  #50 x 2   Entered and Authorized by:   [name]  MD   Signed by:   [name]  MD on 12/19/2009   Method used:   Print then Give to Patient   RxID:   [id number]    Orders Added: 1)  Capillary Blood Glucose/CBG [95621] 2)  Est. Patient Level III [30865] 3)  Orthopedic Referral [Ortho]

## 2010-03-04 NOTE — Assessment & Plan Note (Signed)
Summary: pt//ccm   Nurse Visit   Allergies: No Known Drug Allergies Laboratory Results   Blood Tests      INR: 2.7   (Normal Range: 0.88-1.12   Therap INR: 2.0-3.5) Comments: Rita Ohara  November 11, 2009 10:30 AM     Orders Added: 1)  Est. Patient Level I [99211] 2)  Protime [16109UE]   ANTICOAGULATION RECORD PREVIOUS REGIMEN & LAB RESULTS   Previous INR:  1.1 RATIO on  09/28/2007    NEW REGIMEN & LAB RESULTS Current INR: 2.7 Regimen: 2.5mg . QD  Repeat testing in: Check Mon.  Anticoagulation Visit Questionnaire Coumadin dose missed/changed:  No Abnormal Bleeding Symptoms:  No  Any diet changes including alcohol intake, vegetables or greens since the last visit:  No Any illnesses or hospitalizations since the last visit:  No Any signs of clotting since the last visit (including chest discomfort, dizziness, shortness of breath, arm tingling, slurred speech, swelling or redness in leg):  No  MEDICATIONS ALTACE 10 MG CAPS (RAMIPRIL) 1 once daily AMARYL 4 MG TABS (GLIMEPIRIDE) Take 1 tablet by mouth once a day HYDROCHLOROTHIAZIDE 25 MG TABS (HYDROCHLOROTHIAZIDE) Take 1 tablet by mouth every morning KLOR-CON M20 20 MEQ TBCR (POTASSIUM CHLORIDE CRYS CR) 1 once daily LIPITOR 20 MG TABS (ATORVASTATIN CALCIUM) 1 tab once daily METFORMIN HCL 1000 MG TABS (METFORMIN HCL) Take 1 tablet by mouth twice a day METOPROLOL TARTRATE 100 MG TABS (METOPROLOL TARTRATE) Take 1 tablet by mouth twice a day NITROQUICK 0.4 MG SUBL (NITROGLYCERIN) as needed PLAVIX 75 MG TABS (CLOPIDOGREL BISULFATE) Take 1 tablet by mouth once a day HEMORRHOIDAL-HC 25 MG  SUPP (HYDROCORTISONE ACETATE) use as dir DILTIAZEM HCL COATED BEADS 120 MG XR24H-CAP (DILTIAZEM HCL COATED BEADS) one daily WARFARIN SODIUM 5 MG TABS (WARFARIN SODIUM) one daily

## 2010-03-04 NOTE — Assessment & Plan Note (Signed)
Summary: PT // RS   Nurse Visit   Allergies: No Known Drug Allergies Laboratory Results   Blood Tests   Date/Time Received: November 25, 2009 12:08 PM  Date/Time Reported: November 25, 2009 12:08 PM    INR: 3.6   (Normal Range: 0.88-1.12   Therap INR: 2.0-3.5) Comments: Wynona Canes, CMA  November 25, 2009 12:08 PM     Orders Added: 1)  Est. Patient Level I [99211] 2)  Protime [16109UE]  Laboratory Results   Blood Tests      INR: 3.6   (Normal Range: 0.88-1.12   Therap INR: 2.0-3.5) Comments: Wynona Canes, CMA  November 25, 2009 12:08 PM       ANTICOAGULATION RECORD PREVIOUS REGIMEN & LAB RESULTS   Previous INR:  2.7 on  11/11/2009  Previous Regimen:  2.5mg . QD on  11/11/2009  NEW REGIMEN & LAB RESULTS Anticoag. Dx: V58.83,V58.61.427.31 Current INR Goal Range: 2.0-3.0 Current INR: 3.6 Current Coumadin Dose(mg): 2.5mg  qd Regimen: Hold mon. & thu. Then 2.5mg  on other days       Repeat testing in: 10 days MEDICATIONS ALTACE 10 MG CAPS (RAMIPRIL) 1 once daily AMARYL 4 MG TABS (GLIMEPIRIDE) Take 1 tablet by mouth once a day HYDROCHLOROTHIAZIDE 25 MG TABS (HYDROCHLOROTHIAZIDE) Take 1 tablet by mouth every morning KLOR-CON M20 20 MEQ TBCR (POTASSIUM CHLORIDE CRYS CR) 1 once daily LIPITOR 20 MG TABS (ATORVASTATIN CALCIUM) 1 tab once daily METFORMIN HCL 1000 MG TABS (METFORMIN HCL) Take 1 tablet by mouth twice a day METOPROLOL TARTRATE 100 MG TABS (METOPROLOL TARTRATE) Take 1 tablet by mouth twice a day NITROQUICK 0.4 MG SUBL (NITROGLYCERIN) as needed PLAVIX 75 MG TABS (CLOPIDOGREL BISULFATE) Take 1 tablet by mouth once a day HEMORRHOIDAL-HC 25 MG  SUPP (HYDROCORTISONE ACETATE) use as dir DILTIAZEM HCL COATED BEADS 120 MG XR24H-CAP (DILTIAZEM HCL COATED BEADS) one daily WARFARIN SODIUM 5 MG TABS (WARFARIN SODIUM) one daily   Anticoagulation Visit Questionnaire      Coumadin dose missed/changed:  No      Abnormal Bleeding Symptoms:  Yes   Any diet  changes including alcohol intake, vegetables or greens since the last visit:  No Any illnesses or hospitalizations since the last visit:  No Any signs of clotting since the last visit (including chest discomfort, dizziness, shortness of breath, arm tingling, slurred speech, swelling or redness in leg):  No Adverse Events: Patient fell during the weekend

## 2010-03-04 NOTE — Assessment & Plan Note (Signed)
Summary: per check out with pa/saf      Allergies Added: NKDA  Visit Type:  rov Primary Provider:  [name]  MD  CC:  test results and fall right side ? rotator cuff...soreness.  History of Present Illness: [name] returns today for followup of his atrial for relation, anticoagulation, coronary disease, carotid disease, small bowel aortic aneurysm.  Please see recent note by our PA. He fell out of a truck while working. He fell on his right shoulder. He is seeing orthopedics for possible surgical intervention.  He's having no angina or ischemic symptoms. He denies palpitations, presyncope or syncope. He's had no orthopnea, PND or edema. He denies any symptoms of TIAs.  A recent echo was stable with sustained left ventricular systolic function. Carotid Dopplers were stable as was his abdominal aortic scan.  He remains on Coumadin.  Clinical Reports Reviewed:  Cardiac Cath:  10/03/2007: Cardiac Cath Findings:   A left ventriculogram done in the RAO position showed an EF of 50% with   inferior hypokinesis.      On panning down the abdominal aorta, there was moderate-sized infrarenal   abdominal aortic aneurysm.      ASSESSMENT:   1. Three-vessel coronary artery disease with high-grade lesions in the       first diagonal which appear old in right coronary artery which may       be between the stents.   2. Low normal left ventricular function.   3. Infrarenal abdominal aortic aneurysm.      PLAN:  Plan will be for percutaneous intervention on the RCA today with   Dr. Clifton Fisher.  He will need outpatient abdominal CT to further evaluate   his abdominal aortic aneurysm.  He is being counseled for the need for   smoking cessation.             Jason Fisher. Bensimhon, MD   Electronically Signed  Cardiac Cath Findings:   IMPRESSION:   1. In-stent restenosis of proximal RCA as well as progression of non-       stented disease in proximal RCA.   2. Successful  percutaneous coronary intervention with placement of a       Promus drug-eluting stent in the proximal right coronary artery.      RECOMMENDATIONS:  I recommend continued medical management at this point   to include aspirin, statin, Plavix, and a beta-blocker.  This patient   should be continued on Plavix for at least 1 year.  He will be admitted   to telemetry tonight and if he does well, he will be discharged to home   tomorrow.           Jason Carrow, MD   Electronically Signed      04/30/2000: Cardiac Cath Findings:  IMPRESSIONS: 1. Preserved left ventricular systolic function. 2. Moderate diffuse coronary artery disease as described.  It is unclear which    vessel is the culprit; however, I suspect it may be the diagonal which    has a long area of disease.  This is a relatively small caliber vessel.  RECOMMENDATIONS:  Aggressive medical therapy with risk factor modification. Would also consider obtaining a followup stress imaging study in approximately three to four weeks. DD:  04/30/00 TD:  05/01/00 Job: [id number]   Current Medications (verified): 1)  Altace 10 Mg Caps (Ramipril) .Marland Kitchen.. 1 Once Daily 2)  Amaryl 4 Mg Tabs (Glimepiride) .... Take 1 Tablet By Mouth Once A Day  3)  Hydrochlorothiazide 25 Mg Tabs (Hydrochlorothiazide) .... Take 1 Tablet By Mouth Every Morning 4)  Klor-Con M20 20 Meq Tbcr (Potassium Chloride Crys Cr) .Marland Kitchen.. 1 Once Daily 5)  Lipitor 20 Mg Tabs (Atorvastatin Calcium) .Marland Kitchen.. 1 Tab Once Daily 6)  Metformin Hcl 1000 Mg Tabs (Metformin Hcl) .... Take 1 Tablet By Mouth Twice A Day 7)  Metoprolol Tartrate 100 Mg Tabs (Metoprolol Tartrate) .... Take 1 Tablet By Mouth Twice A Day 8)  Nitroquick 0.4 Mg Subl (Nitroglycerin) .... As Needed 9)  Plavix 75 Mg Tabs (Clopidogrel Bisulfate) .... Take 1 Tablet By Mouth Once A Day 10)  Hemorrhoidal-Hc 25 Mg  Supp (Hydrocortisone Acetate) .... Use As Dir 11)  Diltiazem Hcl Coated Beads 120 Mg Xr24h-Cap  (Diltiazem Hcl Coated Beads) .... One Daily 12)  Warfarin Sodium 5 Mg Tabs (Warfarin Sodium) .... One Daily 13)  Hydrocodone-Acetaminophen 5-500 Mg Tabs (Hydrocodone-Acetaminophen) .... One or Two Every 6 Hours As Needed For Pain  Allergies (verified): No Known Drug Allergies  Review of Systems       negative other than history of present illness  Vital Signs:  Patient profile:   70 year old male Height:      70 inches Weight:      196.75 pounds BMI:     28.33 Pulse rate:   118 / minute Pulse rhythm:   irregularly irregular BP sitting:   146 / 78  (left arm) Cuff size:   large  Vitals Entered By: Jason Devoid RN (January 01, 2010 3:03 PM)  Physical Exam  General:  obese.  acute distress Head:  normocephalic and atraumatic Eyes:  PERRLA/EOM intact; conjunctiva and lids normal. Neck:  Neck supple, no JVD. No masses, thyromegaly or abnormal cervical nodes. Chest Wall:  no deformities or breast masses noted Lungs:  Clear bilaterally to auscultation and percussion. Heart:  irregular rate and rhythm, there was one S2, no obvious murmur, no obvious carotid bruits Msk:  decreased ROM.  especially right shoulder Pulses:  pulses normal in all 4 extremities Extremities:  1+ left pedal edema and 1+ right pedal edema.   Neurologic:  Alert and oriented x 3. Skin:  Intact without lesions or rashes. Axillary Nodes:  No palpable lymphadenopathy Psych:  Normal affect.   Impression & Recommendations:  Problem # 1:  SHOULDER PAIN, RIGHT (ICD-719.41) Assessment New He had traumatic injury of his right shoulder. He may need surgery by Dr. Rennis Fisher. Operative clearance given.  Problem # 2:  ANTICOAGULATION RX (ICD-V58.61) Assessment: Unchanged  Problem # 3:  ATRIAL FIBRILLATION (ICD-427.31) Assessment: Unchanged  continue rate control and anticoagulation. His updated medication list for this problem includes:    Metoprolol Tartrate 100 Mg Tabs (Metoprolol tartrate) .Marland Kitchen... Take 1 tablet  by mouth twice a day    Plavix 75 Mg Tabs (Clopidogrel bisulfate) .Marland Kitchen... Take 1 tablet by mouth once a day    Warfarin Sodium 5 Mg Tabs (Warfarin sodium) ..... One daily  Orders: EKG w/ Interpretation (93000)  Problem # 4:  CAROTID STENOSIS (ICD-433.10) Assessment: Unchanged  His updated medication list for this problem includes:    Plavix 75 Mg Tabs (Clopidogrel bisulfate) .Marland Kitchen... Take 1 tablet by mouth once a day    Warfarin Sodium 5 Mg Tabs (Warfarin sodium) ..... One daily  Problem # 5:  ABDOMINAL AORTIC ANEURYSM (ICD-441.4) Assessment: Unchanged  Problem # 6:  CORONARY ARTERY DISEASE (ICD-414.00) Assessment: Unchanged  His updated medication list for this problem includes:    Altace 10 Mg Caps (  Ramipril) .Marland Kitchen... 1 once daily    Metoprolol Tartrate 100 Mg Tabs (Metoprolol tartrate) .Marland Kitchen... Take 1 tablet by mouth twice a day    Nitroquick 0.4 Mg Subl (Nitroglycerin) .Marland Kitchen... As needed    Plavix 75 Mg Tabs (Clopidogrel bisulfate) .Marland Kitchen... Take 1 tablet by mouth once a day    Diltiazem Hcl Coated Beads 120 Mg Xr24h-cap (Diltiazem hcl coated beads) ..... One daily    Warfarin Sodium 5 Mg Tabs (Warfarin sodium) ..... One daily  Problem # 7:  MYOCARDIAL INFARCTION, HX OF (ICD-412) Assessment: Unchanged  His updated medication list for this problem includes:    Altace 10 Mg Caps (Ramipril) .Marland Kitchen... 1 once daily    Metoprolol Tartrate 100 Mg Tabs (Metoprolol tartrate) .Marland Kitchen... Take 1 tablet by mouth twice a day    Nitroquick 0.4 Mg Subl (Nitroglycerin) .Marland Kitchen... As needed    Plavix 75 Mg Tabs (Clopidogrel bisulfate) .Marland Kitchen... Take 1 tablet by mouth once a day    Diltiazem Hcl Coated Beads 120 Mg Xr24h-cap (Diltiazem hcl coated beads) ..... One daily    Warfarin Sodium 5 Mg Tabs (Warfarin sodium) ..... One daily  Problem # 8:  HYPERTENSION (ICD-401.9) Assessment: Improved  His updated medication list for this problem includes:    Altace 10 Mg Caps (Ramipril) .Marland Kitchen... 1 once daily    Hydrochlorothiazide 25  Mg Tabs (Hydrochlorothiazide) .Marland Kitchen... Take 1 tablet by mouth every morning    Metoprolol Tartrate 100 Mg Tabs (Metoprolol tartrate) .Marland Kitchen... Take 1 tablet by mouth twice a day    Diltiazem Hcl Coated Beads 120 Mg Xr24h-cap (Diltiazem hcl coated beads) ..... One daily  Problem # 9:  DIABETES MELLITUS, TYPE II (ICD-250.00) Assessment: Unchanged  His updated medication list for this problem includes:    Altace 10 Mg Caps (Ramipril) .Marland Kitchen... 1 once daily    Amaryl 4 Mg Tabs (Glimepiride) .Marland Kitchen... Take 1 tablet by mouth once a day    Metformin Hcl 1000 Mg Tabs (Metformin hcl) .Marland Kitchen... Take 1 tablet by mouth twice a day  Problem # 10:  HYPERLIPIDEMIA (ICD-272.4) Assessment: Unchanged  His updated medication list for this problem includes:    Lipitor 20 Mg Tabs (Atorvastatin calcium) .Marland Kitchen... 1 tab once daily  Patient Instructions: 1)  Your physician recommends that you schedule a follow-up appointment in: 3 months with Dr. Daleen Squibb 2)  Your physician recommends that you continue on your current medications as directed. Please refer to the Current Medication list given to you today.

## 2010-03-06 NOTE — Letter (Signed)
Summary: Generic Letter  [place] [facility]  [address] [place], [place] [address]   Phone: [phone]  Fax: [phone]    01/23/2010  Jason Fisher [address] [place], [place]  [address]  Dear [name]:  Mr. [name] is a patient followed in our internal medicine practice.  His general medical status and cardiac status are stable and there are no contraindications to the anticipated surgery. If there are perioperative concerns, please do not hesitate to contact this office.          Sincerely,   Eleonore Chiquito  MD

## 2010-03-06 NOTE — Letter (Signed)
Summary: [facility] Office Note   [facility] Office Note   Imported By: Roderic Ovens 01/14/2010 16:28:52  _____________________________________________________________________  External Attachment:    Type:   Image     Comment:   External Document

## 2010-03-06 NOTE — Letter (Signed)
Summary: Clearance Letter  [facility], Main Office  [address]   [place], [place] [address]   Phone: [phone]  Fax: [phone]    January 30, 2010  Re:     Jason Fisher Address:   [address]     [place], [place]  [address] DOB:     10-27-1940 MRN:     [id number]   Dear            Sincerely,  Lisabeth Devoid RN

## 2010-03-06 NOTE — Letter (Signed)
Summary: [facility]  [facility]   Imported By: Marylou Mccoy 02/06/2010 12:25:06  _____________________________________________________________________  External Attachment:    Type:   Image     Comment:   External Document

## 2010-03-06 NOTE — Progress Notes (Signed)
Summary: Call A Nurse   [facility]-A-Nurse Triage Call Report Triage Record Num: [id number] Operator: Albertine Grates Patient Name: Jason Fisher Call Date & Time: 01/13/2010 8:39:46PM Patient Phone: [phone] PCP: [name] Patient Gender: Male PCP Fax : [phone] Patient DOB: 08/11/1940 Practice Name: [facility] Reason for Call: Skinned arm 12-10 and has not stopped bleeding since 12-10. Has covered with dressing but has soaked through. Advised ED. Protocol(s) Used: Abrasions, Lacerations, Puncture Wounds Recommended Outcome per Protocol: See ED Immediately Reason for Outcome: Bleeding not controlled with 10 minutes of direct pressure or controlled with pressure but starts again when pressure released Care Advice:  ~ 01/13/2010 8:44:47PM Page 1 of 1 [id number]

## 2010-03-06 NOTE — Progress Notes (Signed)
Summary: REQUEST LETTER FOR MEDICAL CLEARENCE  Phone Note Call from Patient   Caller: Patient Call For: [name]  MD Summary of Call: Patient request a letter for medical clearance for surgery on his shoulder; there is a letter for cardiac clearence from Dr Daleen Squibb in the patient's chart...patient request to pick up letter on Thursday December 22,2011 Initial call taken by: Roney Jaffe,  January 22, 2010 3:02 PM

## 2010-03-06 NOTE — Letter (Signed)
Summary: [facility]  [facility]   Imported By: Maryln Gottron 01/17/2010 11:28:17  _____________________________________________________________________  External Attachment:    Type:   Image     Comment:   External Document

## 2010-03-06 NOTE — Letter (Signed)
Summary: Clearance Letter  [facility], Main Office  [address]   [place], [place] [address]   Phone: [phone]  Fax: [phone]    January 30, 2010  Re:     Jason Fisher Address:   [address]     [place], [place]  [address] DOB:     1940-02-05 MRN:     [id number]  To whom it may concern: [facility]    In speaking with our pharmacist, Jason Fisher will need to hold his Pradaxa 2-3 days prior to his shoulder surgery.  He will also need to hold his Plavix for 5-7 days prior to surgical intervention as well.  Dr. Daleen Squibb is aware of [name]'s medications.  If you have any further questions please do not hesitate to call our office at [phone].           Sincerely,    Dr. Valera Castle Lisabeth Devoid RN

## 2010-03-06 NOTE — Assessment & Plan Note (Signed)
Summary: cpx---will fast//ccm   Vital Signs:  Patient profile:   70 year old male Weight:      198 pounds Temp:     98.1 degrees F oral BP sitting:   120 / 80  (left arm) Cuff size:   regular  Vitals Entered By: Duard Brady LPN (February 10, 2010 8:53 AM) CC: cpx - (R) shoulder pain - r/t surg last tuesday Is Patient Diabetic? Yes Did you bring your meter with you today? No CBG Result 250   Primary Care Provider:  [name]  MD  CC:  cpx - (R) shoulder pain - r/t surg last tuesday.  History of Present Illness:  70 year old patient who is seen today for a health maintenance exam.  He was hospitalized a couple months ago for uncontrolled atrial fibrillation and Coumadin coagulopathy.  He is now on Pradaxa the hundred 50 mg twice daily.  He has type 2 diabetes, hypertension, and coronary artery disease.  He is followed closely by cardiology.  He also has a history of colonic polyps.  His cardiotonic status has been stable.  Six days ago, he underwent arthroscopic right shoulder surgery.  Here for Medicare AWV:  70.   Risk factors based on Past M, S, F history: cardiovascular risk factors include hypertension, dyslipidemia, and diabetes, as well as a strong family history of coronary artery disease.  The patient has documented CAD and prior MI 2.   Physical Activities: no restrictions 3.   Depression/mood:no history of  depression or mood disorder 4.   Hearing: no deficits 5.   ADL's: independent in all aspects of daily living 6.   Fall Risk: low 7.   Home Safety: no problems identified 8.   Height, weight, &visual acuity:height and weight stable.  No change in visual acuity 9.   Counseling: heart healthy diet more regular exercise and modest weight loss.  All encouraged 10.   Labs ordered based on risk factors: hemoglobin A1C and urine for microalbumin will be reviewed 11.           Referral Coordination- follow-up cardiology, and GI 12.           Care Plan- regular  exercise modest weight loss encouraged 13.            Cognitive Assessment- alert and oriented, with normal affect.  No cognitive dysfunction   Allergies (verified): No Known Drug Allergies  Past History:  Past Medical History: Reviewed history from 12/19/2009 and no changes required. 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 father died age 44 acute MI mother died age  62, acute MI Two brothers, one died of an MI at age 49  Social History: Reviewed history from 07/07/2008 and no changes required. Married Tobacco Use - Yes.  Alcohol Use - no Drug Use - no  Review of Systems  The patient denies anorexia, fever, weight loss, weight gain, vision loss, decreased hearing, hoarseness, chest pain, syncope, dyspnea on exertion, peripheral edema, prolonged cough, headaches, hemoptysis, abdominal pain, melena, hematochezia, severe indigestion/heartburn,  hematuria, incontinence, genital sores, muscle weakness, suspicious skin lesions, transient blindness, difficulty walking, depression, unusual weight change, abnormal bleeding, enlarged lymph nodes, angioedema, breast masses, and testicular masses.    Physical Exam  General:  overweight-appearing.  130/80overweight-appearing.   Head:  Normocephalic and atraumatic without obvious abnormalities. No apparent alopecia or balding. Eyes:  No corneal or  conjunctival inflammation noted. EOMI. Perrla. Funduscopic exam benign, without hemorrhages, exudates or papilledema. Vision grossly normal. Ears:  External ear exam shows no significant lesions or deformities.  Otoscopic examination reveals clear canals, tympanic membranes are intact bilaterally without bulging, retraction, inflammation or discharge. Hearing is grossly normal bilaterally. Nose:  External nasal examination shows no deformity or inflammation. Nasal mucosa are pink and moist without lesions or exudates. Mouth:  Oral mucosa and oropharynx without lesions or exudates.  Teeth in good repair. Neck:  No deformities, masses, or tenderness noted. Chest Wall:  No deformities, masses, tenderness or gynecomastia noted. Breasts:  No masses or gynecomastia noted Lungs:  Normal respiratory effort, chest expands symmetrically. Lungs are clear to auscultation, no crackles or wheezes. Heart:  irhythm, with a controlled ventricular response Abdomen:  Bowel sounds positive,abdomen soft and non-tender without masses, organomegaly or hernias noted. Msk:  right shoulder in a sling, status post recent arthroscopic surgery Pulses:  R and L carotid,radial,femoral,dorsalis pedis and posterior tibial pulses are full and equal bilaterally Extremities:  No clubbing, cyanosis, edema, or deformity noted with normal full range of motion of all joints.   Neurologic:  cranial nerves II-XII intact, sensation intact to pinprick, and gait normal.  cranial nerves II-XII intact, sensation intact to pinprick, and gait normal.   Skin:  Intact without suspicious lesions or rashes Cervical Nodes:  No lymphadenopathy noted Axillary Nodes:  No palpable lymphadenopathy Inguinal Nodes:  No significant adenopathy Psych:  Cognition and judgment appear intact. Alert and cooperative with normal attention span and concentration. No apparent delusions, illusions, hallucinations   Impression & Recommendations:  Problem # 1:   Preventive Health Care (ICD-V70.0)  Orders: Medicare -1st Annual Wellness Visit [phone])  Complete Medication List: 1)  Amaryl 4 Mg Tabs (Glimepiride) .... Take 1 tablet by mouth once a day 2)  Hydrochlorothiazide 25 Mg Tabs (Hydrochlorothiazide) .... Take 1 tablet by mouth every morning 3)  Klor-con M20 20 Meq Tbcr (Potassium chloride crys cr) .Marland Kitchen.. 1 once daily 4)  Lipitor 20 Mg Tabs (Atorvastatin calcium) .Marland Kitchen.. 1 tab once daily 5)  Metformin Hcl 1000 Mg Tabs (Metformin hcl) .... Take 1 tablet by mouth twice a day 6)  Metoprolol Tartrate 100 Mg Tabs (Metoprolol tartrate) .... Take 1 tablet by mouth twice a day 7)  Nitroquick 0.4 Mg Subl (Nitroglycerin) .... As needed 8)  Plavix 75 Mg Tabs (Clopidogrel bisulfate) .... Take 1 tablet by mouth once a day 9)  Hemorrhoidal-hc 25 Mg Supp (Hydrocortisone acetate) .... Use as dir 10)  Diltiazem Hcl Coated Beads 120 Mg Xr24h-cap (Diltiazem hcl coated beads) .... One daily 11)  Hydrocodone-acetaminophen 5-500 Mg Tabs (Hydrocodone-acetaminophen) .... One or two every 6 hours as needed for pain 12)  Temazepam 30 Mg Caps (Temazepam) .... At bedtime as needed sleep 13)  Robaxin 500 Mg Tabs (Methocarbamol) .Marland Kitchen.. 1 by mouth q8hrs as needed muscle spasm 14)  Oxycodone-acetaminophen 5-325 Mg Tabs (Oxycodone-acetaminophen) .Marland Kitchen.. 1-2 q4-6hrs as needed pain 15)  Ramipril 10 Mg Caps (Ramipril) .... One daily  Other Orders: Capillary Blood Glucose/CBG (29528) Venipuncture (41324) TLB-A1C / Hgb A1C (Glycohemoglobin) (83036-A1C) TLB-Microalbumin/Creat Ratio, Urine (82043-MALB)  Patient Instructions: 1)  Please schedule a follow-up appointment in 3 months. 2)  It is important that you exercise regularly at least 20 minutes 5 times a week. If you develop chest pain, have severe difficulty breathing, or feel very tired , stop exercising immediately and seek medical attention. 3)  You need to lose weight. Consider a lower calorie diet and regular exercise.  4)   Schedule a colonoscopy/sigmoidoscopy to help detect colon cancer. 5)  Check your blood sugars regularly. If your readings are usually above : or below 70 you should contact our office. 6)  It is important that your Diabetic A1c level is checked every 3 months. 7)  See your eye doctor yearly to check for diabetic eye damage. Prescriptions: RAMIPRIL 10 MG CAPS (RAMIPRIL) one daily  #90 x 6   Entered and Authorized by:   [name]  MD   Signed by:   [name]  MD on 02/10/2010   Method used:   Print then Give to Patient   RxID:   [id number] TEMAZEPAM 30 MG CAPS (TEMAZEPAM) at bedtime as needed sleep  #30 x 0   Entered and Authorized by:   [name]  MD   Signed by:   [name]  MD on 02/10/2010   Method used:   Print then Give to Patient   RxID:   [id number] HYDROCODONE-ACETAMINOPHEN 5-500 MG TABS (HYDROCODONE-ACETAMINOPHEN) one or two every 6 hours as needed for pain  #50 x 2   Entered and Authorized by:   [name]  MD   Signed by:   [name]  MD on 02/10/2010   Method used:   Print then Give to Patient   RxID:   [id number] DILTIAZEM HCL COATED BEADS 120 MG XR24H-CAP (DILTIAZEM HCL COATED BEADS) one daily  #90 Capsule x 5   Entered and Authorized by:   [name]  MD   Signed by:   [name]  MD on 02/10/2010   Method used:   Print then Give to Patient   RxID:   [id number] PLAVIX 75 MG TABS (CLOPIDOGREL BISULFATE) Take 1 tablet by mouth once a day  #90 Tablet x 4   Entered and Authorized by:   [name]  MD   Signed by:   [name]  MD on 02/10/2010   Method used:   Print then Give to Patient   RxID:   [id number] METOPROLOL TARTRATE 100 MG TABS (METOPROLOL TARTRATE) Take 1 tablet by mouth twice a day  #180 x 2   Entered and Authorized by:   [name]  MD   Signed by:   [name]  MD on 02/10/2010   Method used:   Print then Give to  Patient   RxID:   [id number] METFORMIN HCL 1000 MG TABS (METFORMIN HCL) Take 1 tablet by mouth twice a day  #180 x 4   Entered and Authorized by:   [name]  MD   Signed by:   [name]  MD on 02/10/2010   Method used:   Print then Give to Patient   RxID:   [id number] LIPITOR 20 MG TABS (ATORVASTATIN CALCIUM) 1 tab once daily  #90 x 4   Entered and Authorized by:   [name]  MD   Signed by:   [name]  MD on 02/10/2010   Method used:   Print then Give to Patient   RxID:  [id number] KLOR-CON M20 20 MEQ TBCR (POTASSIUM CHLORIDE CRYS CR) 1 once daily  #90 Tablet x 4   Entered and Authorized by:   [name]  MD   Signed by:   [name]  MD on 02/10/2010   Method used:   Print then Give to Patient   RxID:   [id number] HYDROCHLOROTHIAZIDE 25 MG TABS (HYDROCHLOROTHIAZIDE) Take 1 tablet by mouth every morning  #90 Tablet x 4   Entered and Authorized by:   [name]  MD   Signed by:   [name]  MD on 02/10/2010   Method used:   Print then Give to Patient   RxID:   [id number] AMARYL 4 MG TABS (GLIMEPIRIDE) Take 1 tablet by mouth once a day  #90 x 4   Entered and Authorized by:   [name]  MD   Signed by:   [name]  MD on 02/10/2010   Method used:   Print then Give to Patient   RxID:   [id number]    Orders Added: 1)  Capillary Blood Glucose/CBG [82948] 2)  Medicare -1st Annual Wellness Visit [G0438] 3)  Est. Patient Level III [42595] 4)  Venipuncture [36415] 5)  TLB-A1C / Hgb A1C (Glycohemoglobin) [83036-A1C] 6)  TLB-Microalbumin/Creat Ratio, Urine [82043-MALB]

## 2010-03-24 ENCOUNTER — Telehealth: Payer: Self-pay

## 2010-03-24 NOTE — Telephone Encounter (Signed)
Yes - this was a printed rx given at last office visit. From what I can see

## 2010-03-24 NOTE — Telephone Encounter (Signed)
Pt requesting refill on hydrocodone acetamin. 5-500mg  - date written 02/13/2010 #50 [facility]

## 2010-03-24 NOTE — Telephone Encounter (Signed)
Has he had total of 3 rf?

## 2010-03-25 NOTE — Telephone Encounter (Signed)
No. Too early

## 2010-03-25 NOTE — Telephone Encounter (Signed)
[facility]

## 2010-04-14 LAB — GLUCOSE, CAPILLARY
Glucose-Capillary: 104 mg/dL — ABNORMAL HIGH (ref 70–99)
Glucose-Capillary: 140 mg/dL — ABNORMAL HIGH (ref 70–99)
Glucose-Capillary: 157 mg/dL — ABNORMAL HIGH (ref 70–99)
Glucose-Capillary: 199 mg/dL — ABNORMAL HIGH (ref 70–99)

## 2010-04-14 LAB — LIPID PANEL
Cholesterol: 109 mg/dL (ref 0–200)
LDL Cholesterol: 50 mg/dL (ref 0–99)

## 2010-04-14 LAB — PROTIME-INR
INR: 1.77 — ABNORMAL HIGH (ref 0.00–1.49)
INR: 9.51 (ref 0.00–1.49)
INR: >10 (ref 0.00–1.49)
Prothrombin Time: 20.8 seconds — ABNORMAL HIGH (ref 11.6–15.2)
Prothrombin Time: 75.7 seconds — ABNORMAL HIGH (ref 11.6–15.2)
Prothrombin Time: >90 seconds — ABNORMAL HIGH (ref 11.6–15.2)

## 2010-04-14 LAB — BASIC METABOLIC PANEL
CO2: 31 mEq/L (ref 19–32)
Chloride: 108 mEq/L (ref 96–112)
Creatinine, Ser: 1.03 mg/dL (ref 0.4–1.5)
GFR calc Af Amer: >60 mL/min (ref >60)
Glucose, Bld: 133 mg/dL — ABNORMAL HIGH (ref 70–99)

## 2010-04-14 LAB — CBC
Hemoglobin: 10.3 g/dL — ABNORMAL LOW (ref 13.0–17.0)
Hemoglobin: 11.2 g/dL — ABNORMAL LOW (ref 13.0–17.0)
MCH: 28.5 pg (ref 26.0–34.0)
MCHC: 32.2 g/dL (ref 30.0–36.0)
MCV: 89.2 fL (ref 78.0–100.0)
Platelets: 241 10*3/uL (ref 150–400)
Platelets: 248 10*3/uL (ref 150–400)
Platelets: 252 10*3/uL (ref 150–400)
RBC: 3.57 MIL/uL — ABNORMAL LOW (ref 4.22–5.81)
RBC: 3.89 MIL/uL — ABNORMAL LOW (ref 4.22–5.81)
WBC: 11.5 10*3/uL — ABNORMAL HIGH (ref 4.0–10.5)

## 2010-04-14 LAB — COMPREHENSIVE METABOLIC PANEL
Alkaline Phosphatase: 62 U/L (ref 39–117)
BUN: 20 mg/dL (ref 6–23)
Chloride: 108 mEq/L (ref 96–112)
Glucose, Bld: 145 mg/dL — ABNORMAL HIGH (ref 70–99)
Potassium: 3.8 mEq/L (ref 3.5–5.1)
Total Bilirubin: 0.5 mg/dL (ref 0.3–1.2)

## 2010-04-14 LAB — HEMOGLOBIN A1C: Mean Plasma Glucose: 148 mg/dL — ABNORMAL HIGH (ref <117)

## 2010-04-14 LAB — RENAL FUNCTION PANEL
Calcium: 9.4 mg/dL (ref 8.4–10.5)
Creatinine, Ser: 1.12 mg/dL (ref 0.4–1.5)
GFR calc Af Amer: >60 mL/min (ref >60)
GFR calc non Af Amer: >60 mL/min (ref >60)
Phosphorus: 3.4 mg/dL (ref 2.3–4.6)
Sodium: 140 mEq/L (ref 135–145)

## 2010-04-14 LAB — PREPARE FRESH FROZEN PLASMA: Unit division: 0

## 2010-04-14 LAB — DIFFERENTIAL
Eosinophils Absolute: 0.2 10*3/uL (ref 0.0–0.7)
Eosinophils Relative: 2 % (ref 0–5)
Lymphs Abs: 3.9 10*3/uL (ref 0.7–4.0)
Monocytes Relative: 9 % (ref 3–12)

## 2010-04-14 LAB — URINALYSIS, MICROSCOPIC ONLY
Bilirubin Urine: NEGATIVE
Ketones, ur: NEGATIVE mg/dL
Nitrite: NEGATIVE
Urobilinogen, UA: 1 mg/dL (ref 0.0–1.0)
pH: 7 (ref 5.0–8.0)

## 2010-04-14 LAB — T4, FREE: Free T4: 1.12 ng/dL (ref 0.80–1.80)

## 2010-04-14 LAB — TYPE AND SCREEN
ABO/RH(D): O NEG
Antibody Screen: NEGATIVE

## 2010-04-14 LAB — HEMOCCULT GUIAC POC 1CARD (OFFICE): Fecal Occult Bld: POSITIVE

## 2010-04-17 ENCOUNTER — Encounter: Payer: Self-pay | Admitting: Cardiology

## 2010-04-17 ENCOUNTER — Ambulatory Visit (INDEPENDENT_AMBULATORY_CARE_PROVIDER_SITE_OTHER): Payer: Medicare Other | Admitting: Cardiology

## 2010-04-17 DIAGNOSIS — F17200 Nicotine dependence, unspecified, uncomplicated: Secondary | ICD-10-CM

## 2010-04-17 DIAGNOSIS — I2510 Atherosclerotic heart disease of native coronary artery without angina pectoris: Secondary | ICD-10-CM

## 2010-04-17 DIAGNOSIS — I6529 Occlusion and stenosis of unspecified carotid artery: Secondary | ICD-10-CM

## 2010-04-22 NOTE — Assessment & Plan Note (Signed)
Summary: f60m/dfg/agh      Allergies Added: NKDA  Visit Type:  Follow-up Primary Provider:  [name]  MD  CC:  no complaints.  History of Present Illness: Mr Folz returns for E and M of his multiple cardiovascular problems. He denies angina, CP. Still smokes a few small cigars a day. Carotids and AAA [place] stable in 11/11.  Current Medications (verified): 1)  Amaryl 4 Mg Tabs (Glimepiride) .... Take 1 Tablet By Mouth Once A Day 2)  Hydrochlorothiazide 25 Mg Tabs (Hydrochlorothiazide) .... Take 1 Tablet By Mouth Every Morning 3)  Klor-Con M20 20 Meq Tbcr (Potassium Chloride Crys Cr) .Marland Kitchen.. 1 Once Daily 4)  Lipitor 20 Mg Tabs (Atorvastatin Calcium) .Marland Kitchen.. 1 Tab Once Daily 5)  Metformin Hcl 1000 Mg Tabs (Metformin Hcl) .... Take 1 Tablet By Mouth Twice A Day 6)  Metoprolol Tartrate 100 Mg Tabs (Metoprolol Tartrate) .... Take 1 Tablet By Mouth Twice A Day 7)  Nitroquick 0.4 Mg Subl (Nitroglycerin) .... As Needed 8)  Plavix 75 Mg Tabs (Clopidogrel Bisulfate) .... Take 1 Tablet By Mouth Once A Day 9)  Hemorrhoidal-Hc 25 Mg  Supp (Hydrocortisone Acetate) .... Use As Dir 10)  Diltiazem Hcl Coated Beads 120 Mg Xr24h-Cap (Diltiazem Hcl Coated Beads) .... One Daily 11)  Hydrocodone-Acetaminophen 5-500 Mg Tabs (Hydrocodone-Acetaminophen) .... One or Two Every 6 Hours As Needed For Pain 12)  Oxycodone-Acetaminophen 5-325 Mg Tabs (Oxycodone-Acetaminophen) .Marland Kitchen.. 1-2 Q4-6hrs As Needed Pain 13)  Ramipril 10 Mg Caps (Ramipril) .... One Daily 14)  Pradaxa 150 Mg Caps (Dabigatran Etexilate Mesylate) .Marland Kitchen.. 1 By Mouth Bid  Allergies (verified): No Known Drug Allergies  Past History:  Past Medical History: Last updated: 12/19/2009 MYOCARDIAL INFARCTION, HX OF (ICD-412) hx of 2002 CORONARY ARTERY DISEASE (ICD-414.00)status post inferior Kasey Ewings MI and stenting to the RCA March 2007 DYSPNEA ON EXERTION (ICD-786.09) HYPERTENSION (ICD-401.9) HYPERLIPIDEMIA (ICD-272.4) DIABETES MELLITUS, TYPE II  (ICD-250.00) BENIGN PROSTATIC HYPERTROPHY, HX OF (ICD-V13.8) COLONIC POLYPS, HX OF (ICD-V12.72)   Atrial fibrillation  Past Surgical History: Last updated: 24-Feb-2010 Cataract extraction PTCA/stent 3/07 colonoscopy November 2001, 2006 Stent for instent restenosis 09/2007 R shoulder surgery- Supple January 2012  Family History: Last updated: 02-24-10 father died age 57 acute MI mother died age  63, acute MI Two brothers, one died of an MI at age 41  Social History: Last updated: 07/07/2008 Married Tobacco Use - Yes.  Alcohol Use - no Drug Use - no  Risk Factors: Smoking Status: current (07/07/2008)  Review of Systems       Negative other than HPI  Vital Signs:  Patient profile:   70 year old male Height:      70 inches Weight:      200.75 pounds BMI:     28.91 Pulse rate:   98 / minute BP sitting:   128 / 84  (left arm) Cuff size:   regular  Vitals Entered By: Caralee Ates CMA (April 17, 2010 10:22 AM)  Physical Exam  General:  obese.  NAD Head:  normocephalic and atraumatic Eyes:  PERRLA/EOM intact; conjunctiva and lids normal. Neck:  Neck supple, no JVD. No masses, thyromegaly or abnormal cervical nodes. Chest Tamarra Geiselman:  no deformities or breast masses noted Lungs:  Decreased BS's throughout Heart:  soft S1 and S2, RRR Abdomen:  Obese, no bruit Msk:  decreased ROM.   Pulses:  pulses normal in all 4 extremities Extremities:  1+ left pedal edema and 1+ right pedal edema.   Neurologic:  Alert and oriented  x 3. Skin:  Intact without lesions or rashes. Psych:  Normal affect.   Impression & Recommendations:  Problem # 1:  ATRIAL FIBRILLATION (ICD-427.31) Assessment Unchanged  His updated medication list for this problem includes:    Metoprolol Tartrate 100 Mg Tabs (Metoprolol tartrate) .Marland Kitchen... Take 1 tablet by mouth twice a day    Plavix 75 Mg Tabs (Clopidogrel bisulfate) .Marland Kitchen... 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 Take 1 tablet by mouth once a day  Problem # 4:  ABDOMINAL AORTIC ANEURYSM (ICD-441.4) Assessment: Unchanged  Problem # 5:  CORONARY ARTERY DISEASE (ICD-414.00) Assessment: Unchanged  His updated medication list for this problem includes:    Metoprolol Tartrate 100 Mg Tabs (Metoprolol tartrate) .Marland Kitchen... Take 1 tablet by mouth twice a day    Nitroquick 0.4 Mg Subl (Nitroglycerin) .Marland Kitchen... As needed    Plavix 75 Mg Tabs (Clopidogrel bisulfate) .Marland Kitchen... Take 1 tablet by mouth once a day    Diltiazem Hcl Coated Beads 120 Mg Xr24h-cap (Diltiazem hcl coated beads) ..... One daily    Ramipril 10 Mg Caps (Ramipril) ..... One daily  Orders: EKG w/ Interpretation (93000)  Problem # 6:  MYOCARDIAL INFARCTION, HX OF (ICD-412) Assessment: Unchanged  His updated medication list for this problem includes:    Metoprolol Tartrate 100 Mg Tabs (Metoprolol tartrate) .Marland Kitchen... Take 1 tablet by mouth twice a day    Nitroquick 0.4 Mg Subl (Nitroglycerin) .Marland Kitchen... As needed    Plavix 75 Mg Tabs (Clopidogrel bisulfate) .Marland Kitchen... Take 1 tablet by mouth once a day    Diltiazem Hcl Coated Beads 120 Mg Xr24h-cap (Diltiazem hcl coated beads) ..... One daily    Ramipril 10 Mg Caps (Ramipril) ..... One daily  Problem # 7:  TOBACCO USER (ICD-305.1) Assessment: Unchanged Encouraged to quit.  Clinical Reports Reviewed:  Cardiac Cath:  10/03/2007: Cardiac Cath Findings:   A left ventriculogram done in the RAO position showed an EF of 50% with   inferior hypokinesis.      On panning down the abdominal aorta, there was moderate-sized infrarenal   abdominal aortic aneurysm.      ASSESSMENT:   1. Three-vessel coronary artery disease with high-grade  lesions in the       first diagonal which appear old in right coronary artery which may       be between the stents.   2. Low normal left ventricular function.   3. Infrarenal abdominal aortic aneurysm.      PLAN:  Plan will be for percutaneous intervention on the RCA today with   Dr. Clifton James.  He will need outpatient abdominal CT to further evaluate   his abdominal aortic aneurysm.  He is being counseled for the need for   smoking cessation.             Bevelyn Buckles. Bensimhon, MD   Electronically Signed  Cardiac Cath Findings:   IMPRESSION:   1. In-stent restenosis of proximal RCA as well as progression of non-       stented disease in proximal RCA.   2. Successful percutaneous coronary intervention with placement of a       Promus drug-eluting stent in the proximal right coronary artery.  RECOMMENDATIONS:  I recommend continued medical management at this point   to include aspirin, statin, Plavix, and a beta-blocker.  This patient   should be continued on Plavix for at least 1 year.  He will be admitted   to telemetry tonight and if he does well, he will be discharged to home   tomorrow.           Verne Carrow, MD   Electronically Signed      04/30/2000: Cardiac Cath Findings:  IMPRESSIONS: 1. Preserved left ventricular systolic function. 2. Moderate diffuse coronary artery disease as described.  It is unclear which    vessel is the culprit; however, I suspect it may be the diagonal which    has a long area of disease.  This is a relatively small caliber vessel.  RECOMMENDATIONS:  Aggressive medical therapy with risk factor modification. Would also consider obtaining a followup stress imaging study in approximately three to four weeks. DD:  04/30/00 TD:  05/01/00 Job: [id number]  Carotid Doppler:  11/26/2008:  IMPRESSIONS:    Stable mild carotid artery disease 0-39% bilateral ICA stenosis  Murrell Converse, MD  09/01/2007:  Impressions: Stable,  mild carotid disease, bilaterally. 0-39% bilateral ICA stenosis.  Charlton Haws, MD  08/19/2005:  Impressions: Mild irregular plaque, bilaterally 0-39% bilateral ICA stenosis.  Randa Evens, MD  Nuclear Study:  11/26/2008:  Excerise capacity: Adenosine with low level exercise  Blood Pressure response: Normal blood pressure response  Clinical symptoms: Mild chest pain/dyspnea  ECG impression: No significant ST segment change suggestive of ischemia  Overall impression: Mild fixed basal to mid inferior defect with associated hypokinesis. This is suggestive of a prior subendocardial defect with no evidence for ischemia.  Marca Ancona, MD   11/26/2008:  QPS  Raw Data Images:  Normal; no motion artifact; normal heart/lung ratio. Stress Images:  Decreased radiotracer uptake in the basal to mid inferior Antonieta Slaven.  Rest Images:  Decreased radiotracer uptake in the basal to mid inferior Demitrios Molyneux.  Subtraction (SDS):  Fixed mild basal to mid inferior defect.  Transient Ischemic Dilatation:  1.12  (Normal <1.22)  Lung/Heart Ratio:  .31  (Normal <0.45)  Quantitative Gated Spect Images  QGS EDV:  113 ml QGS ESV:  62 ml QGS EF:  45 % QGS cine images:  Basal inferior and posterior hypokinesis.   Overall Impression   Exercise Capacity: Adenosine with low level exercise. BP Response: Normal blood pressure response. Clinical Symptoms: Mild chest pain/dyspnea. ECG Impression: No significant ST segment change suggestive of ischemia. Overall Impression: Mild fixed basal to mid inferior defect with associated hypokinesis.  This is suggestive of a prior subendocardial defect with no evidence for ischemia.   Signed by Marca Ancona, MD on 11/26/2008 at 10:22 PM  Signed by Marca Ancona, MD on 11/26/2008 at 10:22 PM ________________________________________________________________________ Stable.  Reviewed Juanito Doom, MD  Signed by Gaylord Shih, MD, [facility] on 11/27/2008 at 11:36 AM   Patient  Instructions: 1)  Your physician recommends that you schedule a follow-up appointment in: November with Dr. Daleen Squibb 2)  Your physician recommends that you continue on your current medications as directed. Please refer to the Current Medication list given to you today. 3)  Your physician recommended you take 1 tablet (or 1 spray) under tongue at onset of chest pain; you may repeat every 5 minutes for up to 3 doses. If 3 or more doses are required, call 911 and proceed to the ER immediately.  Appended Document: f59m/dfg/agh  Clinical Lists Changes  Medications: Rx of NITROQUICK 0.4 MG SUBL (NITROGLYCERIN) as needed;  #25 x 6;  Signed;  Entered by: Lisabeth Devoid RN;  Authorized by: Gaylord Shih, MD, [facility];  Method used: [facility]  [place] [address], [place], [place]  [address], Ph: [phone] or [phone], Fax: [phone]    Prescriptions: NITROQUICK 0.4 MG SUBL (NITROGLYCERIN) as needed  #25 x 6   Entered by:   Lisabeth Devoid RN   Authorized by:   Gaylord Shih, MD, [facility]   Signed by:   Lisabeth Devoid RN on 04/17/2010   Method used:   [facility]        [facility]  [place] [address] (retail)       [address]       [place], [place]  [address]       Ph: [phone] or [phone]       Fax: [phone]   RxID:   [id number]

## 2010-05-07 ENCOUNTER — Encounter: Payer: Self-pay | Admitting: Internal Medicine

## 2010-05-13 ENCOUNTER — Telehealth: Payer: Self-pay

## 2010-05-13 ENCOUNTER — Ambulatory Visit: Payer: Self-pay | Admitting: Internal Medicine

## 2010-05-13 NOTE — Telephone Encounter (Signed)
Pt called to r/s KIK

## 2010-05-13 NOTE — Telephone Encounter (Signed)
Okay to reschedule; do not charge today for this nausea

## 2010-05-13 NOTE — Telephone Encounter (Signed)
Attempt to call pt at home # - ans mach - LMTCB to r/s 3 mos rov appt.   Last seen 02/2010

## 2010-05-16 ENCOUNTER — Encounter: Payer: Self-pay | Admitting: Internal Medicine

## 2010-05-20 ENCOUNTER — Ambulatory Visit (INDEPENDENT_AMBULATORY_CARE_PROVIDER_SITE_OTHER): Payer: Medicare Other | Admitting: Internal Medicine

## 2010-05-20 ENCOUNTER — Encounter: Payer: Self-pay | Admitting: Internal Medicine

## 2010-05-20 DIAGNOSIS — E119 Type 2 diabetes mellitus without complications: Secondary | ICD-10-CM

## 2010-05-20 DIAGNOSIS — I10 Essential (primary) hypertension: Secondary | ICD-10-CM

## 2010-05-20 DIAGNOSIS — I2510 Atherosclerotic heart disease of native coronary artery without angina pectoris: Secondary | ICD-10-CM

## 2010-05-20 DIAGNOSIS — I4891 Unspecified atrial fibrillation: Secondary | ICD-10-CM

## 2010-05-20 NOTE — Progress Notes (Signed)
  Subjective:    Patient ID: Jason Fisher, male    DOB: [birth year], 70 y.o.   MRN: [id number]  HPI  70 year old patient who is seen today for followup. He has type 2 diabetes. Due to some recent orthopedic surgery he has been quite inactive. A fasting blood sugar this morning 223.  He feels well. His last hemoglobin A1c was 6.0 and he has been well controlled over the past few years. He denies any hyperglycemic symptoms he has coronary artery disease and chronic atrial fibrillation. He is on Pradaxa.Marland Kitchen He denies any cardiopulmonary complaints. He denies any exertional chest pain. He has treated hypertension which has been stable. Review of Systems  Constitutional: Negative for fever, chills, appetite change and fatigue.  HENT: Negative for hearing loss, ear pain, congestion, sore throat, trouble swallowing, neck stiffness, dental problem, voice change and tinnitus.   Eyes: Negative for pain, discharge and visual disturbance.  Respiratory: Negative for cough, chest tightness, wheezing and stridor.   Cardiovascular: Negative for chest pain, palpitations and leg swelling.  Gastrointestinal: Negative for nausea, vomiting, abdominal pain, diarrhea, constipation, blood in stool and abdominal distention.  Genitourinary: Negative for urgency, hematuria, flank pain, discharge, difficulty urinating and genital sores.  Musculoskeletal: Negative for myalgias, back pain, joint swelling, arthralgias and gait problem.  Skin: Negative for rash.  Neurological: Negative for dizziness, syncope, speech difficulty, weakness, numbness and headaches.  Hematological: Negative for adenopathy. Does not bruise/bleed easily.  Psychiatric/Behavioral: Negative for behavioral problems and dysphoric mood. The patient is not nervous/anxious.        Objective:   Physical Exam  Constitutional: He is oriented to person, place, and time. He appears well-developed.       150/84  HENT:  Head: Normocephalic.  Right Ear: External  ear normal.  Left Ear: External ear normal.  Eyes: Conjunctivae and EOM are normal.  Neck: Normal range of motion.  Cardiovascular: Normal rate and normal heart sounds.        Controlled ventricular response  Pulmonary/Chest: Breath sounds normal.  Abdominal: Bowel sounds are normal.  Musculoskeletal: Normal range of motion. He exhibits no edema and no tenderness.  Neurological: He is alert and oriented to person, place, and time.  Psychiatric: He has a normal mood and affect. His behavior is normal.          Assessment & Plan:   Diabetes mellitus. Unclear control. Clearly has had worsening glycemic control with decreased activity. Hopefully can resume. We'll check a hemoglobin A1c today and again in 3 months hopefully can continue glycemic control on dual therapy only. Hypertension stable Chronic atrial fibrillation well controlled Coronary artery disease. Asymptomatic

## 2010-05-20 NOTE — Patient Instructions (Signed)
Limit your sodium (Salt) intake    It is important that you exercise regularly, at least 20 minutes 3 to 4 times per week.  If you develop chest pain or shortness of breath seek  medical attention.  You need to lose weight.  Consider a lower calorie diet and regular exercise.   Please check your hemoglobin A1c every 3 months   

## 2010-06-17 NOTE — Discharge Summary (Signed)
NAME:  Jason Fisher, Jason Fisher NO.:  [id number]   MEDICAL RECORD NO.:  [id number]          PATIENT TYPE:  INP   LOCATION:  6532                         FACILITY:  [facility]   PHYSICIAN:  Jesse Sans. Wall, MD, FACCDATE OF BIRTH:  1940-04-08   DATE OF ADMISSION:  10/03/2007  DATE OF DISCHARGE:  10/04/2007                               DISCHARGE SUMMARY   ADDENDUM   Upon further review of the cath report, Dr. Gala Romney was concerned  about a moderately sized infrarenal abdominal aortic aneurysm.  He  recommended an outpatient CT scan.  To that end, the CT angiogram has  been set up for October 12, 2007, at 10 a.m.  The patient will be  informed of this but if his BUN and creatinine do not improve, this will  be deferred.  All other discharge information will be the same.      Theodore Demark, PA-C      Jesse Sans. Daleen Squibb, MD, [facility]  Electronically Signed    RB/MEDQ  D:  10/04/2007  T:  10/05/2007  Job:  [id number]

## 2010-06-17 NOTE — Cardiovascular Report (Signed)
NAME:  Jason Fisher, Jason Fisher NO.:  [id number]   MEDICAL RECORD NO.:  [id number]          PATIENT TYPE:  INP   LOCATION:  6532                         FACILITY:  [facility]   PHYSICIAN:  Verne Carrow, MDDATE OF BIRTH:  12/29/1940   DATE OF PROCEDURE:  10/03/2007  DATE OF DISCHARGE:                            CARDIAC CATHETERIZATION   INDICATIONS:  Mr. Cuppett is a pleasant 70 year old Caucasian male with  a history of hypertension, hyperlipidemia, diabetes mellitus, tobacco  abuse, and known coronary artery disease status post prior stent  placement in the proximal and mid right coronary artery who presents to  the outpatient cath lab today for catheterization given a recent history  of an abnormal stress Myoview study.  The patient was noted on the  stress Myoview to have inferolateral ischemia with a preserved ejection  fraction.  He underwent diagnostic heart catheterization in our  outpatient cath lab where he was found to have significant in-stent  restenosis in the  proximal portion of the right coronary artery as well  as an area of significant stenosis and haziness in the proximal portion  of the right coronary artery just proximal to the stented segment.  He  was brought to the second floor cath lab for a percutaneous coronary  intervention at this time.   PROCEDURE PERFORMED:  Percutaneous coronary intervention with placement  of Promus drug-eluting stent in an area of in-stent restenosis in the  proximal right coronary artery and a non-stented segment in the right  coronary artery that is just proximal to the stented segment.   OPERATOR:  Verne Carrow, MD   DETAILS OF THE PROCEDURE:  The patient was brought to the second floor  cath lab after being found to have a significant obstruction of the  stented segment of the right coronary artery.  He did sign an informed  consent prior to his diagnostic catheterization that stated he wished to  have a  percutaneous coronary intervention if that were necessary.  He  was transported with a 4-French sheath in his right femoral artery.  We  prepped this area sterilely and applied a sterile drape.  The 4-French  sheath was changed out to a 6-French sheath in the right femoral artery.  A JR-4, 6-French guide was used to engage the right coronary artery.  I  attempted to pass a Cougar intracoronary wire down the right coronary  artery but could not get past the stented segment in the midportion of  the vessel.  A Whisper wire was then passed down the length of the right  coronary artery.  I initially attempted to pass a 2.5 x 10-mm cutting  balloon to the area of stenosis; however, I was unable to cross the  lesion with this balloon.  I then used a 2.5 x 12-mm balloon for  predilatation.  At this point, I felt that we needed to address the in-  stent restenosis as well as an area of significant obstruction and  haziness in the very proximal part of the right coronary artery.  I  attempted to pass a 3.0 x 28-mm  Promus drug-eluting stent; however, I  could not get this down to the area of blockage secondary to the vessel  having had prior stent placement.  I then used a 3.0 x 20-mm balloon for  further predilatation of the lesion.  At this time, I was still unable  to pass the stent down to the significant segment.  I then used a  Balanced Middleweight Buddy wire.  At this point, I was able to pass the  3.0 x 20-mm Promus drug-eluting stent to the distal portion of the  proximal RCA where it was inflated with full expansion.  The stent  extended almost entirely back to the ostium of the right coronary  artery.  I then performed postdilatation with a 3.5 x 15-mm noncompliant  balloon in the proximal portion of the stent.  I once again had  difficulty passing this all the way down secondary to this severe bend  of the right coronary artery.  I elected to use a 3.5 x 6-mm  noncompliant balloon to  get down to the more distal portion of the  stented segment.  I then inflated this to 16 atmospheres to have a  better postdilatation of this stented segment.  There was TIMI III flow  down the vessel following the procedure.  The pre-stenosis of this  lesion was 99%, following the intervention was 0%.  The patient  tolerated the procedure well and was taken to the recovery area with the  sheath in his right femoral artery.  Manual pressure will be used for  hemostasis.   IMPRESSION:  1. In-stent restenosis of proximal RCA as well as progression of non-      stented disease in proximal RCA.  2. Successful percutaneous coronary intervention with placement of a      Promus drug-eluting stent in the proximal right coronary artery.   RECOMMENDATIONS:  I recommend continued medical management at this point  to include aspirin, statin, Plavix, and a beta-blocker.  This patient  should be continued on Plavix for at least 1 year.  He will be admitted  to telemetry tonight and if he does well, he will be discharged to home  tomorrow.      Verne Carrow, MD  Electronically Signed     CM/MEDQ  D:  10/03/2007  T:  10/04/2007  Job:  [id number]   cc:   Thomas C. Daleen Squibb, MD, [facility]  Bevelyn Buckles. Bensimhon, MD

## 2010-06-17 NOTE — Assessment & Plan Note (Signed)
[facility]                            CARDIOLOGY OFFICE NOTE   DILLIAN, FEIG                         MRN:          [id number]  DATE:10/27/2007                            DOB:          Dec 13, 1940    Mr. Matheson comes back today for followup.  He is status post a drug-  eluting stent to a 95% stenosis in the right coronary artery, which was  actually between 2 previous stents.  His only symptom was dyspnea on  exertion with a positive Myoview.   We also did an abdominal CT, which showed a 3.6-cm infrarenal abdominal  aortic aneurysm.  He had mild stenosis of the celiac artery.   Since discharge, his dyspnea on exertion might be a little bit better.  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    His meds are unchanged from last visit.  Please refer to the Main's  medication list.   His blood pressure today is 142/78, his pulse is 58 and regular, his  weight is 212.  HEENT is normal.  Carotids upstrokes are equal and  bilateral without bruits.  No JVD.  Thyroid is not enlarged.  Trachea is  midline.  Lungs are clear.  Heart reveals soft S1 and S2.  No gallop.  Abdominal exam is protuberant, good bowel sounds.  Extremities reveal no  cyanosis, clubbing, or edema.  Pulses are intact.   Mr. Dematteo is stable from my standpoint.  I will see him back in 4  months.  He will need a stress test in 6 months and also one every year  thereafter because of silent ischemia or defective warning system.     Thomas C. Daleen Squibb, MD, [facility]  Electronically Signed    TCW/MedQ  DD: 10/27/2007  DT: 10/28/2007  Job #: [id number]

## 2010-06-17 NOTE — Cardiovascular Report (Signed)
NAME:  Jason Fisher, Jason Fisher NO.:  [id number]   MEDICAL RECORD NO.:  [id number]          PATIENT TYPE:  OIB   LOCATION:  1962                         FACILITY:  [facility]   PHYSICIAN:  Bevelyn Buckles. Bensimhon, MDDATE OF BIRTH:  April 01, 1940   DATE OF PROCEDURE:  10/03/2007  DATE OF DISCHARGE:  10/03/2007                            CARDIAC CATHETERIZATION   PRIMARY CARE PHYSICIAN:  [name], MD   CARDIOLOGIST:  Jesse Sans. Wall, MD, [facility]   PATIENT IDENTIFICATION:  Jason Fisher is a 70 year old male with a  history of coronary artery disease.  He is status post previous inferior  wall myocardial infarction.  He says he has 2 stents in the right  coronary artery dating back to 2007.  Recently, he saw Dr. Daleen Squibb for some  dyspnea on exertion.  He had a Myoview which showed an EF of 51% and  inferior lateral ischemia.  He is referred to diagnostic  catheterization.  He unfortunately continues to smoke cigarettes.  He  also is a diabetic.   PROCEDURES PERFORMED:  1. Selective coronary angiography.  2. Left heart catheterization.  3. Left ventriculogram.   DESCRIPTION OF PROCEDURE:  The risks and indications were explained,  consent was signed, and placed on the chart.  A 4-French arterial sheath  was placed in the right femoral artery using a modified Seldinger  technique.  Standard catheters were used including JL-4, 3DRC, and  angled pigtail.  Then all catheter exchange was made over wire.  There  were no apparent complications.   HEMODYNAMIC DATA:  Central aortic pressure was 140/78 with a mean of  105.  LV pressure was 143/17 with an EDP of 25.  There was no aortic  stenosis.   Left main had a 30% stenosis.   LAD was a long vessel coursing through the apex.  In the proximal to  midportion, there was diffuse 40% lesion.   In the first diagonal, there was 60% proximally and 95% tubular lesion  in the lower branch.  This was chronic.   Left circumflex gave off  small OM1, a large OM2, and a small  posterolateral.  There was a 40% ostial circumflex lesion and a 60%  lesion in the midportion of the large second marginal.   Right coronary artery was a large dominant vessel that gave off an acute  marginal branch of PDA and 2 posterolaterals.  In the ostium of the RCA,  there was a 40-50% hazy lesion.  This was followed by what appeared to  be 2 areas of stenting.  There was a 95% lesion which appeared to be  between the 2 stents and may have been within the first stent, it was  difficult to tell.  In the mid-to-distal RCA there was a 50% lesion and  the distal RCA was a diffuse 40% lesion.  There was a 70% lesion at the  ostium of the most distal posterolateral.   A left ventriculogram done in the RAO position showed an EF of 50% with  inferior hypokinesis.   On panning down the abdominal aorta, there was  moderate-sized infrarenal  abdominal aortic aneurysm.   ASSESSMENT:  1. Three-vessel coronary artery disease with high-grade lesions in the      first diagonal which appear old in right coronary artery which may      be between the stents.  2. Low normal left ventricular function.  3. Infrarenal abdominal aortic aneurysm.   PLAN:  Plan will be for percutaneous intervention on the RCA today with  Dr. Clifton James.  He will need outpatient abdominal CT to further evaluate  his abdominal aortic aneurysm.  He is being counseled for the need for  smoking cessation.      Bevelyn Buckles. Bensimhon, MD  Electronically Signed     DRB/MEDQ  D:  10/03/2007  T:  10/03/2007  Job:  [id number]

## 2010-06-17 NOTE — Assessment & Plan Note (Signed)
[facility]                            CARDIOLOGY OFFICE NOTE   KASHTON, MCARTOR                         MRN:          [id number]  DATE:09/28/2007                            DOB:          1940-11-16    CHIEF COMPLAINT:  Dyspnea on exertion.   HISTORY OF PRESENT ILLNESS:  Mr. Jason Fisher is a 70 year old white male  with known coronary artery disease.  I saw him in the office on September 01, 2007.  Because of dyspnea on exertion and a need for followup of his  coronary artery disease, we did a stress Myoview.  This shows  inferolateral ischemia.  EF 51%.  He has septal hypokinesia.   He has had a previous stent x2 to the right coronary artery in March  2007.  This was in the setting of an inferior wall infarct.   PAST MEDICAL HISTORY:  Unfortunately, he continues to smoke.  He has  hyperlipidemia, type 2 diabetes, and hypertension.   He has nonobstructive carotid artery disease with a followup carotid  Dopplers on September 01, 2007.   CURRENT MEDS:  1. Lipitor 20 mg at bedtime.  2. Aspirin 325 mg a day.  3. Altace 10 mg a day.  4. Hydrochlorothiazide 25 mg a day.  5. Potassium 20 mEq a day.  6. Plavix 75 mg a day.  7. Metformin 1000 mg a day.  8. Amlodipine 10 mg a day.  9. Glimepiride 4 mg a day.  10.Metoprolol tartrate 100 mg p.o. b.i.d.   ALLERGIES:  He has no dye allergies.  He does not list any allergies.   FAMILY HISTORY:  Noncontributory.   SOCIAL HISTORY:  Please see previous chart.   REVIEW OF SYSTEMS:  All systems are negative and questioned.  Please  refer the HPI for cardiopulmonary.   PHYSICAL EXAMINATION:  VITAL SIGNS:  Blood pressure of 118/77, his pulse  is 72 and regular, and his weight is 210.  HEENT:  Normocephalic, atraumatic.  PERRLA.  Extraocular movements  intact.  Sclerae are clear.  Facial symmetry is normal.  NECK:  Carotid upstrokes were equal bilaterally without bruits.  No JVD.  Thyroid is not enlarged.  Trachea is  midline.  LUNGS:  Decreased breath sounds throughout.  He has some expiratory  rhonchi.  HEART:  Nondisplaced PMI.  Normal S1 and S2, poorly appreciated PMI.  ABDOMEN:  Soft.  Good bowel sounds.  No organomegaly is appreciated.  No  pulsatile mass.  EXTREMITIES:  Good pulses.  No edema.  NEUROLOGIC:  Intact.  SKIN:  Few ecchymoses.   EKG shows sinus rhythm with ST-segment changes inferolaterally.   ASSESSMENT/PLAN:  Mr. Porreca has dyspnea on exertion, which is probably  multifactorial.  He certainly has inferolateral wall ischemia on his  stress Myoview.  He needs a relook cath.  Indications, risks, and  potential benefits have been specified and he agrees to proceed.  We  will set this up as a JV cath lab.  We will hold his metformin.     Thomas C. Daleen Squibb, MD, [facility]  Electronically Signed  TCW/MedQ  DD: 09/28/2007  DT: 09/28/2007  Job #: [id number]   cc:   [name], MD

## 2010-06-17 NOTE — Assessment & Plan Note (Signed)
[facility]                            CARDIOLOGY OFFICE NOTE   DELOSS, AMICO                         MRN:          [id number]  DATE:09/01/2007                            DOB:          1940-09-24    Mr. Gitto returns today for followup of the following issue:  1. Coronary artery disease, status post inferior wall infarct.  He was      stented right coronary artery x2 in March 2007.  2. Sustained left ventricular function.  3. Status post non-ST-segment elevation MI in 2002.  4. Tobacco use and is still smoking.  5. Cerebrovascular disease with nonobstructive plaque.  6. Pulmonary carotids today show 0-39% bilateral internal carotid      artery stenoses, antegrade flow in both vertebrals.  He is having      no ischemic symptoms.  7. Type 2 diabetes.  8. Hypertension.  9. Hyperlipidemia.   He offers no complaint today.  He had retired from [employer].  He  does have some dyspnea on exertion, however.   His medicines:  1. Lipitor 20 mg nightly.  2. Aspirin 325 mg a day.  3. Altace 10 mg a day.  4. HCTZ 25 mg a day.  5. Potassium 20 mEq a day.  6. Plavix 75 mg a day.  7. Metformin 1000 mg a day.  8. Amlodipine 10 mg a day.  9. Glimepiride 4 mg a day.  10.Metoprolol tartrate 100 mg b.i.d.   PHYSICAL EXAMINATION:  VITAL SIGNS:  His blood pressure today is 132/82.  His pulse is 66 and regular.  His weight is 204.  GENERAL:  He smells heavily of tobacco.  HEENT:  Alert and oriented x3.  SKIN:  Warm and dry.  NECK:  Supple.  Carotid upstrokes were equal bilaterally without bruits.  No JVD.  Thyroid is not enlarged.  LUNGS:  Clear except for his expiratory rhonchi.  HEART:  A poorly appreciated PMI.  He has a barrel chest.  Normal S1 and  S2.  ABDOMEN:  Soft.  Good bowel sounds.  Organomegaly could not be  appreciated.  EXTREMITIES:  No edema.  Pulses were present, but reduced.  NEURO:  Intact.   Electrocardiogram shows normal sinus  rhythm with ST-segment changes  inferolaterally, which have improved.   ASSESSMENT AND PLAN:  Mr. Sagan seems to be stable from heart  standpoint.  He needs a stress Myoview, which we will arrange.  He does  have some significant dyspnea on exertion, which could be from his  heart, smoking, or from his weight.  We have not changed any  medications.     Thomas C. Daleen Squibb, MD, [facility]  Electronically Signed    TCW/MedQ  DD: 09/01/2007  DT: 09/02/2007  Job #: [id number]

## 2010-06-17 NOTE — Discharge Summary (Signed)
NAME:  Jason Fisher, Jason Fisher NO.:  [id number]   MEDICAL RECORD NO.:  [id number]          PATIENT TYPE:  INP   LOCATION:  6532                         FACILITY:  [facility]   PHYSICIAN:  Jesse Sans. Wall, MD, FACCDATE OF BIRTH:  1940/04/07   DATE OF ADMISSION:  10/03/2007  DATE OF DISCHARGE:  10/04/2007                               DISCHARGE SUMMARY   PROCEDURES:  1. Cardiac catheterization.  2. Coronary arteriogram.  3. Left ventriculogram.  4. Percutaneous transluminal coronary angioplasty and drug-eluting      stent to one vessel.   FINAL DISCHARGE DIAGNOSIS:  Unstable anginal pain status post cardiac  catheterization with percutaneous intervention in this admission.   SECONDARY DIAGNOSES:  1. Hyperlipidemia.  2. Ongoing tobacco use.  3. Obesity with a body mass index of 30.2.  4. Inferior wall myocardial infarction with percutaneous transluminal      coronary angioplasty and stent x2 to the right coronary artery in      2007.  5. Non-ST segment elevation myocardial infarction in 2002 with a small      diffusely diseased diagonal branch, felt to be the culprit lesion.  6. Diabetes.  7. Hyperlipidemia.   TIME OF DISCHARGE:  41 minutes.   HOSPITAL COURSE:  Jason Fisher is a 70 year old male with a history of  coronary artery disease.  A stress Myoview showed inferolateral leg  ischemia and an EF of 51%.  He was brought in for cath on October 03, 2007.   The cardiac catheterization showed a 30% left main, 40% LAD, 60%  diagonal with a 95% in the lower diagonal branch that is unchanged,  circumflex 40%, OM-2 60%, RCA distal 50%, PDA 70%, 95% RCA between the 2  previous stents was seen and his EF was 50%.  This was treated with PTCA  and a Promus drug-eluting stent reducing the stenosis to 0.  He is to be  on Plavix for a year and continue on his other medications.   On October 04, 2007, Jason Fisher was seen by Dr. Daleen Squibb.  His BUN and  creatinine were higher with a  BUN of 22, creatinine 1.2, and a GFR of 64  preprocedure, but the BUN was 14, creatinine 1.64, and GFR of 42  postprocedure.  His groin was without ecchymosis or hematoma.  Dr. Daleen Squibb  started IV hydration and held the Altace.  He is to get followup labs on  Thursday and follow up in the office in 2 weeks.  He was seen by Cardiac  Rehab and will follow up with Outpatient Rehab as well.  Pending  completion of his hydration, he is considered stable for discharge with  close outpatient followup.   DISCHARGE INSTRUCTIONS:  His activity is to be increased gradually with  no lifting for 2 weeks and no driving for 2 days.  He is to stick to a  low-sodium diabetic diet.  He is to call our office for problems with  the cath site.  He is to get a BMET on Thursday.  He is to follow up  with Dr. Daleen Squibb  on October 19, 2007, at 4:30 and with Dr. Amador Cunas as  needed.   DISCHARGE MEDICATIONS:  1. Metformin 1000 mg b.i.d., hold for 48 hours.  2. Glimepiride 4 mg daily.  3. HCTZ hold for 48 hours.  4. KCl hold for 48 hours.  5. Amlodipine 10 mg a day.  6. Metoprolol 100 mg a day.  7. Plavix 75 mg a day.  8. Altace hold for 48 hours.  9. Lipitor 20 mg a day.  10.Aspirin 325 mg a day.  11.Nitroglycerin sublingual p.r.n.      Theodore Demark, PA-C      Jesse Sans. Daleen Squibb, MD, [facility]  Electronically Signed    RB/MEDQ  D:  10/04/2007  T:  10/05/2007  Job:  [id number]   cc:   [name], MD

## 2010-06-20 NOTE — Assessment & Plan Note (Signed)
[facility]ALTHC[facility]                              [place] OFFICE NOTE   Jason Fisher, Jason Fisher                         MRN:          [id number]  DATE:10/26/2005                            DOB:          August 04, 1940    A 70 year old gentleman who is seen today for a comprehensive exam.  He has  a long history of coronary artery disease and is status post acute inferior  wall MI in March of this year, 2 stents replaced at that time.  He has type  2 diabetes, hypertension, hypercholesterolemia, and tobacco use.  He is  still smoking a couple of cigars daily.  He has a history also of colonic  polyps.  His only complaints today is some rectal pain with his bowel  movements.  Last colonoscopy was in November of 2005.   FAMILY HISTORY:  Strikingly positive for coronary artery disease.  One  brother deceased at age 26.  Mother died at 39 of an MI and his father died  at 54.   EXAM:  An overweight male in no acute distress.  Blood pressure was 130/70.  Fundi, ear, nose, and throat clear.  NECK:  No bruits.  CHEST:  Clear.  CARDIOVASCULAR:  Normal heart sounds.  No murmurs.  ABDOMEN:  Obese, soft, and non tender.  No organomegaly.  External genitalia normal.  RECTAL:  Exam revealed an apparent tender hemorrhoid.  Stool was heme  negative.  Prostate was unremarkable.  EXTREMITIES:  Negative with full peripheral pulses.  No edema.   IMPRESSION:  1. Coronary artery disease.  2. Hypertension.  3. Dyslipidemia.   DISPOSITION:  He is given a prescription for Anusol HC suppositories.  He  will also be placed on metformin extended release, 100 mg daily.  He has  been asked to return in 3 months for followup.                                   [name], MD   PFK/MedQ  DD:  10/26/2005  DT:  10/28/2005  Job #:  [id number]

## 2010-06-20 NOTE — Discharge Summary (Signed)
NAME:  Jason Fisher, Jason Fisher NO.:  [id number]   MEDICAL RECORD NO.:  [id number]          PATIENT TYPE:  INP   LOCATION:  4709                         FACILITY:  [facility]   PHYSICIAN:  Pricilla Riffle, M.D.    DATE OF BIRTH:  07/05/1940   DATE OF ADMISSION:  04/09/2005  DATE OF DISCHARGE:  04/12/2005                                 DISCHARGE SUMMARY   PRIMARY CARDIOLOGIST:  Dr. Juanito Doom.   DISCHARGE DIAGNOSES:  1.  Coronary artery disease, status post inferior wall myocardial      infarction, status post cardiac catheterization with a percutaneous      transluminal coronary angioplasty/stent to the right coronary artery x2.  2.  Ongoing tobacco abuse.  3.  History of coronary artery disease, status post non-ST-elevation      myocardial infarction in 2002 with cardiac catheterization demonstrating      moderate, diffuse coronary artery disease with possible culprit lesion      at that time being a small diffusely diseased diagonal branch.  Ejection      fraction was normal at that time.  4.  Type 2 diabetes.  5.  Hypertension.  6.  Hyperlipidemia.   PROCEDURES THIS ADMISSION:  Cardiac catheterization on April 08, 2005 by Dr.  Bonnee Quin.   HOSPITAL COURSE:  Jason Fisher is a 70 year old gentleman with known history  of CAD, type 2 diabetes, hypertension, hyperlipidemia, with ongoing tobacco  abuse, previous myocardial infarction, who presented to [facility] on day of admission, April 09, 2005, complaining of chest discomfort.  EKG demonstrated evidence of an inferior wall myocardial infarction.  The  patient was markedly hypertensive with a blood pressure in the 220/120 range  and tachycardic.  Initial management included aspirin, Plavix, heparin, IV  beta blockers, IV nitroglycerin and ultimately hydralazine for better  hemodynamic stabilization.  The patient was taken to the cath lab urgently  by Dr. Riley Kill.  The patient is status post PCI and stent to  the right  coronary artery x2 using a Vision stent.  The patient tolerated the  procedure without complications.  Blood pressure stabilized with titration  of beta blocker.  Cardiac Rehabilitation in to work with patient, Smoking  Cessation also consulted, patient transferred to telemetry, still mildly  hypertensive, blood pressure medication titrated up once again, the  patient's discharge letter today pending stable blood pressure with  ambulation in hall.   MEDICATIONS AT DISCHARGE:  1.  Lopressor 75 mg q.a.m., 50 mg in the p.m.  2.  Lipitor 20 mg at bedtime.  3.  Aspirin 325 mg daily.  4.  Altace 10 mg daily.  5.  Actos 45 mg daily.  6.  Hydrochlorothiazide 25 mg daily.  7.  Potassium 20 mEq daily.  8.  Plavix 75 mg daily.  9.  The patient has also been given a prescription for nitroglycerin for      chest discomfort.   DISCHARGE INSTRUCTIONS:  He has been given the post-cardiac-catheterization  discharge instructions and post-MI contract.   DIET:  He is to continue a  low-fat, low-salt diabetic diet.   FOLLOWUP:  He is to follow up with Dr. Vern Claude extender within the next 2  weeks; the patient agrees to call office on Monday and schedule an  appointment.  He also agrees to call our office for any problems from the  cath site.   LABORATORY WORK:  Lab work prior to discharge includes a TSH of 0.865, BUN  18, creatinine 1.3; hemoglobin 12.5.  Cardiac enzymes:  Troponin peaked at  8.43.  Total cholesterol 133, LDL 80, HDL 45, triglycerides were 39.  The  patient was slightly hypokalemic on April 11, 2005 with a potassium of 3.4,  which has been supplemented p.o.  AST 65, ALT 22, total bilirubin 0.8.  Hemoglobin A1c of 6.3.   DURATION OF DISCHARGE ENCOUNTER:  Thirty minutes.      Dorian Pod, NP    ______________________________  Pricilla Riffle, M.D.    MB/MEDQ  D:  04/12/2005  T:  04/14/2005  Job:  [id number]   cc:   [name], M.D. [facility]  [address] [place]  [place] [address]

## 2010-06-20 NOTE — H&P (Signed)
NAME:  Jason Fisher, Jason Fisher NO.:  [id number]   MEDICAL RECORD NO.:  [id number]          PATIENT TYPE:  INP   LOCATION:  1824                         FACILITY:  [facility]   PHYSICIAN:  Jonelle Sidle, M.D. LHCDATE OF BIRTH:  02/19/1940   DATE OF ADMISSION:  04/09/2005  DATE OF DISCHARGE:                                HISTORY & PHYSICAL   REASON FOR ADMISSION:  Acute ST elevation inferior wall myocardial  infarction.   HISTORY OF PRESENT ILLNESS:  Jason Fisher is a 70 year old male with a  history of type 2 diabetes mellitus, hypertension, hyperlipidemia, ongoing  tobacco abuse, and prior history of non-ST elevation myocardial infarction  in 2002.  He underwent cardiac catheterization at that time which  demonstrated moderate, diffuse coronary artery disease with possible culprit  lesion being a diffusely diseased diagonal that was managed medically given  its relatively small caliber.  The patient was noted to have preserved left  ventricular systolic function at that time.  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  His symptoms began at approximately 5 p.m. and became more  intense.  He ultimately phoned his wife and was taken to the emergency  department.  His electrocardiogram from 7:13 p.m. demonstrates evidence of  an inferior wall myocardial infarction.  He was markedly hypertensive on  presentation with blood pressure in the 220 over 120 range and relatively  tachycardic.  Initial management included aspirin, Plavix, heparin, IV beta  blockers, IV nitroglycerin, and ultimately Hydralazine for better  hemodynamic stabilization.  The case was discussed with Dr. Riley Kill and the  cardiac catheterization lab with plans for urgent coronary angiography with  an eye towards revascularization.  The risks and benefits of this  were  explained to the patient and his wife and they are in agreement.   ALLERGIES:  No known drug allergies.  No obvious contrast allergies.   MEDICATIONS AT HOME:  Altace 10 mg p.o. daily, Actos 45 mg p.o. daily,  Lipitor 10 mg p.o. daily, hydrochlorothiazide 25 mg p.o. daily.   SOCIAL HISTORY:  The patient is married and has two children.  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  He has a  one pack per day tobacco use history for approximately 40 years.  He denies  any alcohol abuse history.   FAMILY HISTORY:  This was reviewed and is noncontributory at this point.   REVIEW OF SYMPTOMS:  As described in the history of present illness.  The  patient states he has been in his usual state of health, however, his wife  tells me that he seems to have been having some intermittent chest pain  recently.  He notes no obvious bleeding problems, orthopnea, PND,  palpitations, peripheral edema, or syncope.  Review of systems is,  otherwise, negative.   PHYSICAL EXAMINATION:  VITAL SIGNS:  Blood pressure down to 190/100, heart rate in the  high 80s and  sinus rhythm, oxygen saturation on the high 90s on 2 liters nasal cannula,  respirations 20.  GENERAL:  This is an overweight male in no acute distress stating that his  chest pain has improved but not yet resolved.  NECK:  No elevated jugular venous pressure or loud carotid bruits.  No  thyromegaly or thyroid tenderness noted.  LUNGS:  Clear without wheezing or rales.  HEART:  Regular rate and rhythm with a soft S3 gallop noted, there is no  loud murmur or pericardial rub.  ABDOMEN:  Soft with no obvious hepatomegaly, bowel sounds normal.  EXTREMITIES:  No significant pitting edema.  Peripheral pulses are 1+.  SKIN:  No ulcer changes noted.  MUSCULOSKELETAL:  No  kyphosis is noted.  NEUROPSYCHIATRIC:  The patient is alert and oriented x 3.   LABORATORY DATA:  Hemoglobin 16, hematocrit 47, potassium  3.1, BUN 20,  creatinine 1.1.  Chest x-ray is pending.   IMPRESSION:  1.  Acute ST elevation inferior wall myocardial infarction with symptom      onset approximately 5 p.m.  2.  Prior history of non-ST elevation myocardial infarction in 2002 with      cardiac catheterization demonstrating moderate, diffuse coronary artery      disease with possible culprit lesion at that time being a small,      diffusely diseased diagonal branch.  Left ventricular ejection fraction      was normal at that time.  3.  Type 2 diabetes mellitus.  4.  Ongoing tobacco abuse.  5.  Hypertension.  6.  Hyperlipidemia.   PLAN:  1.  The patient is to be taken urgently for diagnostic coronary angiography      with an eye towards percutaneous intervention.  Dr. Riley Kill and the cath      lab staff have been notified.  2.  Will continue present medications with further therapy dictated by      findings and needs in the catheterization lab.  3.  Follow up lipid profile.  4.  Smoking cessation consult.  5.  Further plans to follow.           ______________________________  Jonelle Sidle, M.D. [facility]     SGM/MEDQ  D:  04/09/2005  T:  04/09/2005  Job:  [id number]   cc:   [name], M.D. [facility]  [address] [place]  [place] [address]   Jesse Sans Wall, M.D.  [address]  [place]  [place] [address]

## 2010-06-20 NOTE — Discharge Summary (Signed)
[place]  Patient:    Jason Fisher, Jason Fisher                         MRN: [id number] Adm. Date:  [id number] Disc. Date: [id number] Attending:  Mirian Mo Dictator:   Cornell Barman, P.A.                           Discharge Summary  DISCHARGE DIAGNOSES: 1. ______ . 2. Newly diagnosed diabetes mellitus. 3. Moderate diffuse coronary artery disease.  HISTORY OF PRESENT ILLNESS:  Jason Fisher is a 70 year old white male who presented with chest pain.  Onset around 9:30 a.m.  The patient was at work lifting boxes of doughnuts.  The patient had no prior history of pain.  The patient described left chest pressure, duration about 20 minutes.  It was relieved with rest.  He took an aspirin as well.  He did have associated dyspnea and mild diaphoresis.  No nausea or radiation.  No dyspnea on exertion.  No prior exertional chest pain.  No resting chest pain.  The patient does describe decreased exercise tolerance over the past two to three weeks.  He does not have any prior cardiac history including MI or stress testing.  The patient was pain-free on evaluation in the emergency room.  CARDIAC RISK FACTORS: 1. Diabetes mellitus. 2. Hypertension. 3. Hypercholesterolemia. 4. Tobacco use. 5. Family history.  PAST MEDICAL HISTORY: 1. Hypertension. 2. Hypercholesterolemia.  HOSPITAL COURSE: #1 - CHEST PAIN:  The patient had many cardiac risk factors and gave indication of poor compliance with medications.  The patient states that he stopped taking his antihypertensives and cholesterol-lowering agents approximately one month prior to this admission secondary to headache, noting that the patient had taken them for years without prior headache.  The patient was seen by cardiology, who did agree that cardiac catheterization was indicated.  Cardiac catheterization was performed on April 30, 2000.  The patient had presumed LV systolic function and moderate  diffuse coronary artery disease.  Cardiology recommended aggressive medical therapy with risk factor modification.  #2 - HYPERTENSION:  The patients blood pressure showed improved control with the resumption of his medications.  #3 - DIABETES:  The patient is a newly diagnosed diabetic, with an elevated hemoglobin A1C.  LABORATORY DATA PRIOR TO DISCHARGE:  Hemoglobin 16, hematocrit 47. Coagulations normal.  Hemoglobin A1C was 7.7%.  LFTs were normal.  Total cholesterol was elevated at 213, triglycerides 97, HDL 34, LDL 160. Urinalysis was negative.  DISCHARGE MEDICATIONS: 1. Prandin 2 mg, 1 prior to each meal. 2. Actos 45 mg q.d. 3. Aspirin 325 mg q.d. 4. Lopressor 50 mg b.i.d. 5. Altace 10 mg q.d. 6. Nitroglycerin as needed.  FOLLOW-UP:  The patient was instructed to follow up with cardiology as instructed, and follow up with Jason Fisher in approximately three weeks. DD:  06/02/00 TD:  06/03/00 Job: [id number]

## 2010-06-20 NOTE — Cardiovascular Report (Signed)
[place]  Patient:    Jason Fisher, Jason Fisher                         MRN: [id number] Proc. Date: 04/30/00 Adm. Date:  [id number] Disc. Date: [id number] Attending:  Mirian Mo CC:         [name], M.D.  Thomas C. Wall, M.D. [facility]  Cardiac Catheterization Lab   Cardiac Catheterization  PROCEDURE:  Left heart catheterization with coronary angiography and left ventriculography.  CARDIOLOGIST:  Daisey Must, M.D. [facility]  INDICATION:  Jason Fisher is a 70 year old male multiple cardiovascular risk factors admitted with chest pain;.  Cardiac markers were positive for a small non-Q-wave myocardial infarction.  CATHETERIZATION PROCEDURAL NOTE:  A 6-French sheath was placed in the right femoral artery.  Standard Judkins 6-French catheters were utilized.  Contrast was Omnipaque.  There were no complications.  CATHETERIZATION RESULTS:  HEMODYNAMICS:  Left ventricular pressure 140/18, aortic pressure 140/86. There was no aortic valve gradient.  LEFT VENTRICULOGRAM:  Wall motion is normal.  Ejection fraction calculated at 61%.  No mitral regurgitation.  ABDOMINAL AORTOGRAM:   Abdominal aortogram reveals normal right arteries bilaterally.  There was mild ectasia and early aneurysmal changes of the distal abdominal aorta.  Iliac arteries are free of significant disease.  CORONARY ANGIOGRAPHY: (Right dominant).  Left main is normal.  Left anterior descending artery has a 20% stenosis at it origin.  In the mid vessel just after the first diagonal branch is a 40% stenosis.  Further down in the mid vessel is a second 40% stenosis.  The distal vessel has a diffuse 30% stenosis.  The first diagonal is normal in size.  There is a 25% stenosis in its origin.  In the mid body of the first diagonal is a long 80% stenosis, and this diagonal is approximately 2 mm or less in diameter.  Left circumflex has a diffuse 25% stenosis in the mid vessel.  The  circumflex gives rise to a small OM-1, large OM-2, small OM-3, and a small OM-4.  OM-2 has a diffuse 30% stenosis.  Right coronary artery is a dominant vessel.  It is diffusely disease throughout its length.  In the proximal vessel is a 30% stenosis followed by a discrete 50% stenosis.  The mid vessel has a diffuse 40 to 50% stenosis, and the distal vessel has a diffuse 20 to 30% stenosis extending into the A-V groove portion of the right coronary artery.  In the very distal portion of the vessel is a small area of ectasia.  The right coronary artery gives rise to a normal size posterior descending artery, a normal size first posterolateral branch, a small second posterolateral branch, and normal third posterolateral.  IMPRESSIONS: 1. Preserved left ventricular systolic function. 2. Moderate diffuse coronary artery disease as described.  It is unclear which    vessel is the culprit; however, I suspect it may be the diagonal which    has a long area of disease.  This is a relatively small caliber vessel.  RECOMMENDATIONS:  Aggressive medical therapy with risk factor modification. Would also consider obtaining a followup stress imaging study in approximately three to four weeks. DD:  04/30/00 TD:  05/01/00 Job: [id number]

## 2010-06-20 NOTE — Assessment & Plan Note (Signed)
[facility] HEALTHCARE                            CARDIOLOGY OFFICE NOTE   Jason Fisher, Jason Fisher                         MRN:          [id number]  DATE:04/13/2006                            DOB:          Jan 29, 1941    This is a patient of Dr. Juanito Doom and Dr. Amador Cunas.   This is a 70 year old white male patient of Dr. Juanito Doom, who is here  today for followup of his Horizons stent.  He has a history of an  inferior wall MI treated with PTCA and stenting to the RCA x2, April 09, 2005, by Dr. Riley Kill, with a Vision stent.  He has done quite well since  then.  He denies any chest pain, palpitations, shortness of breath,  dizziness, or presyncope.  He says he walks a lot in his job.  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  He still smokes a cigar  every now and then, and actually does smell of smoke today in the  office, but quit cigarette smoking.   CURRENT MEDICATIONS:  1. Lopressor 75 mg in the morning, 50 in the evening.  2. Lipitor 20 mg nightly.  3. Aspirin 325 daily.  4. Altace 10 mg daily.  5. Actos 45 mg daily.  6. Hydrochlorothiazide 25 mg daily.  7. KCl 20 mEq daily.  8. Plavix 75 mg daily.  9. Amlodipine 10 mg daily.  10.Metformin 2 g daily.   PHYSICAL EXAMINATION:  A pleasant 70 year old white male in no acute  distress.  Blood pressure 118/78, pulse 63, weight 212.  NECK:  Without JVD, HDR, bruit or thyroid enlargement.  LUNGS:  Decreased breath sounds, but they are clear, anterior, posterior  and lateral.  HEART:  Regular rate and rhythm at 63 beats per minute.  Normal S1 and  S2, distant hearts sounds, no murmur, rub, bruit, thrill, or heave  noted.  ABDOMEN:  Soft without organomegaly, masses, lesions, or abnormal  tenderness.  EXTREMITIES:  Without cyanosis, clubbing, or edema.  He has good distal  pulses.   IMPRESSION:  1. Coronary artery disease  status post inferior wall myocardial      infarction, treated with stenting to right coronary artery x2 with      a Vision stent in the Horizon study.  2. Status post non-ST segment myocardial infarction in 2002 with cath      at that time demonstrating diffuse coronary disease with culprit      lesion being in the diffusely diseased diagonal branch.  3. Tobacco abuse, still smokes cigars.  4. Diabetes mellitus.  5. Hypertension.  6. Hyperlipidemia.   PLAN:  Patient is doing quite well from a cardiac standpoint.  Dr.  Vern Claude note back in May of 2007, suggested having an exercise Myoview in  September, which was not performed.  Because he is doing so well I will  have him see Dr. Daleen Squibb back this May for a 1 year followup, and he can  decide on further stress testing, as this patient has not had it done  since his stent.      Jacolyn Reedy, PA-C  Electronically Signed      Rollene Rotunda, MD, [facility]  Electronically Signed   ML/MedQ  DD: 04/13/2006  DT: 04/15/2006  Job #: [id number]   cc:   Thomas C. Daleen Squibb, MD, [facility]  [name], MD

## 2010-07-01 ENCOUNTER — Encounter: Payer: Self-pay | Admitting: Internal Medicine

## 2010-07-01 ENCOUNTER — Ambulatory Visit (INDEPENDENT_AMBULATORY_CARE_PROVIDER_SITE_OTHER): Payer: Self-pay | Admitting: Internal Medicine

## 2010-07-01 DIAGNOSIS — I2510 Atherosclerotic heart disease of native coronary artery without angina pectoris: Secondary | ICD-10-CM

## 2010-07-01 DIAGNOSIS — I10 Essential (primary) hypertension: Secondary | ICD-10-CM

## 2010-07-01 DIAGNOSIS — E119 Type 2 diabetes mellitus without complications: Secondary | ICD-10-CM

## 2010-07-01 DIAGNOSIS — I4891 Unspecified atrial fibrillation: Secondary | ICD-10-CM

## 2010-07-01 LAB — GLUCOSE, POCT (MANUAL RESULT ENTRY): POC Glucose: 162

## 2010-07-01 NOTE — Patient Instructions (Signed)
Limit your sodium (Salt) intake   Please check your hemoglobin A1c every 3 months    It is important that you exercise regularly, at least 20 minutes 3 to 4 times per week.  If you develop chest pain or shortness of breath seek  medical attention.   

## 2010-07-01 NOTE — Progress Notes (Signed)
  Subjective:    Patient ID: Jason Fisher, male    DOB: 11-Apr-1940, 70 y.o.   MRN: [id number]  HPI  70 year old patient who is seen today for followup.  The patient had been referred for a functional capacity evaluation and the patient is here today for medical clearance to proceed with the evaluation.  The functional capacity evaluation includes 3-4 hours of testing that includes a submaximal treadmill walking cast. He denies any exertional chest pain and his clinical status has been stable. He does have a history of coronary artery disease and is status post stenting in 2007 he was seen by cardiology 2 months ago and felt to be stable he has type 2 diabetes treated hypertension dyslipidemia and a history of abdominal aortic aneurysm.  It is his understanding that the main indication for the functional capacity evaluation is to optimize the patient's capacity to return to work. He is 66 and states he does not wish to return to work and is retired at the present time.    Review of Systems  Constitutional: Negative for fever, chills, appetite change and fatigue.  HENT: Negative for hearing loss, ear pain, congestion, sore throat, trouble swallowing, neck stiffness, dental problem, voice change and tinnitus.   Eyes: Negative for pain, discharge and visual disturbance.  Respiratory: Negative for cough, chest tightness, wheezing and stridor.   Cardiovascular: Negative for chest pain, palpitations and leg swelling.  Gastrointestinal: Negative for nausea, vomiting, abdominal pain, diarrhea, constipation, blood in stool and abdominal distention.  Genitourinary: Negative for urgency, hematuria, flank pain, discharge, difficulty urinating and genital sores.  Musculoskeletal: Negative for myalgias, back pain, joint swelling, arthralgias and gait problem.  Skin: Negative for rash.  Neurological: Negative for dizziness, syncope, speech difficulty, weakness, numbness and headaches.  Hematological: Negative for  adenopathy. Does not bruise/bleed easily.  Psychiatric/Behavioral: Negative for behavioral problems and dysphoric mood. The patient is not nervous/anxious.        Objective:   Physical Exam  Constitutional: He is oriented to person, place, and time. He appears well-developed.  HENT:  Head: Normocephalic.  Right Ear: External ear normal.  Left Ear: External ear normal.  Eyes: Conjunctivae and EOM are normal.  Neck: Normal range of motion.  Cardiovascular: Normal rate and normal heart sounds.   Pulmonary/Chest: Breath sounds normal.  Abdominal: Bowel sounds are normal.  Musculoskeletal: Normal range of motion. He exhibits no edema and no tenderness.  Neurological: He is alert and oriented to person, place, and time.  Psychiatric: He has a normal mood and affect. His behavior is normal.          Assessment & Plan:   Coronary artery disease. Clinically stable Hypertension controlled Diabetes mellitus. Last single an A1c of 6.0. This was 4 months ago Will recheck today  Options were discussed with the patient. Since he does not with to return to full-time employment, he does not wish to proceed with the functional capacity evaluation

## 2010-07-02 ENCOUNTER — Ambulatory Visit: Payer: Medicare Other | Admitting: Internal Medicine

## 2010-07-02 LAB — HEMOGLOBIN A1C: Hgb A1c MFr Bld: 7.9 % — ABNORMAL HIGH (ref 4.6–6.5)

## 2010-07-03 NOTE — Progress Notes (Signed)
Quick Note:  SPOKE WITH PT - INFORMED OD LAB AND DR. KWIATKOWSKI INSTRUCTIONS. ______

## 2010-07-26 ENCOUNTER — Other Ambulatory Visit: Payer: Self-pay | Admitting: Internal Medicine

## 2010-08-15 ENCOUNTER — Other Ambulatory Visit: Payer: Self-pay | Admitting: Internal Medicine

## 2010-09-03 ENCOUNTER — Encounter: Payer: Self-pay | Admitting: Internal Medicine

## 2010-09-03 ENCOUNTER — Ambulatory Visit (INDEPENDENT_AMBULATORY_CARE_PROVIDER_SITE_OTHER): Payer: Medicare Other | Admitting: Internal Medicine

## 2010-09-03 DIAGNOSIS — E119 Type 2 diabetes mellitus without complications: Secondary | ICD-10-CM

## 2010-09-03 DIAGNOSIS — I4891 Unspecified atrial fibrillation: Secondary | ICD-10-CM

## 2010-09-03 DIAGNOSIS — I2510 Atherosclerotic heart disease of native coronary artery without angina pectoris: Secondary | ICD-10-CM

## 2010-09-03 DIAGNOSIS — I10 Essential (primary) hypertension: Secondary | ICD-10-CM

## 2010-09-03 LAB — HEMOGLOBIN A1C: Hgb A1c MFr Bld: 7.4 % — ABNORMAL HIGH (ref 4.6–6.5)

## 2010-09-03 NOTE — Patient Instructions (Signed)
Please check your hemoglobin A1c every 3 months    It is important that you exercise regularly, at least 20 minutes 3 to 4 times per week.  If you develop chest pain or shortness of breath seek  medical attention.  You need to lose weight.  Consider a lower calorie diet and regular exercise. 

## 2010-09-03 NOTE — Progress Notes (Signed)
  Subjective:    Patient ID: Jason Fisher, male    DOB: 07/23/1940, 70 y.o.   MRN: [id number]  HPI48 year old patient who is in today for followup of his type 2 diabetes. He is doing quite well. Last visit his hemoglobin A1c was elevated and for the past 3 months has been on a much better diet. A random blood sugar this morning 228. His weight is only down 1 pound. He feels well His coronary artery disease and paroxysmal atrial fibrillation which has been stable. Denies any chest pain   Review of Systems  Constitutional: Negative for fever, chills, appetite change and fatigue.  HENT: Negative for hearing loss, ear pain, congestion, sore throat, trouble swallowing, neck stiffness, dental problem, voice change and tinnitus.   Eyes: Negative for pain, discharge and visual disturbance.  Respiratory: Negative for cough, chest tightness, wheezing and stridor.   Cardiovascular: Negative for chest pain, palpitations and leg swelling.  Gastrointestinal: Negative for nausea, vomiting, abdominal pain, diarrhea, constipation, blood in stool and abdominal distention.  Genitourinary: Negative for urgency, hematuria, flank pain, discharge, difficulty urinating and genital sores.  Musculoskeletal: Negative for myalgias, back pain, joint swelling, arthralgias and gait problem.  Skin: Negative for rash.  Neurological: Negative for dizziness, syncope, speech difficulty, weakness, numbness and headaches.  Hematological: Negative for adenopathy. Does not bruise/bleed easily.  Psychiatric/Behavioral: Negative for behavioral problems and dysphoric mood. The patient is not nervous/anxious.        Objective:   Physical Exam  Constitutional: He is oriented to person, place, and time. He appears well-developed.  HENT:  Head: Normocephalic.  Right Ear: External ear normal.  Left Ear: External ear normal.  Eyes: Conjunctivae and EOM are normal.  Neck: Normal range of motion.  Cardiovascular: Normal rate and  normal heart sounds.   Pulmonary/Chest: Breath sounds normal.  Abdominal: Bowel sounds are normal.  Musculoskeletal: Normal range of motion. He exhibits no edema and no tenderness.  Neurological: He is alert and oriented to person, place, and time.  Psychiatric: He has a normal mood and affect. His behavior is normal.          Assessment & Plan:    Diabetes mellitus. We'll check a hemoglobin A1c today if this is improved we'll continue his present regimen. If unchanged will need to add a third drug Atrial fibrillation. Stable Hypertension controlled  We'll continue lifestyle changes with more exercise and better diet. We'll check a hemoglobin A1c today

## 2010-09-03 NOTE — Progress Notes (Signed)
  Subjective:    Patient ID: Jason Fisher, male    DOB: [birth year], 70 y.o.   MRN: [id number]  HPI Wt Readings from Last 3 Encounters:  09/03/10 201 lb (91.173 kg)  07/01/10 202 lb (91.627 kg)  05/20/10 204 lb (92.534 kg)     Review of Systems     Objective:   Physical Exam        Assessment & Plan:

## 2010-10-20 ENCOUNTER — Telehealth: Payer: Self-pay | Admitting: Internal Medicine

## 2010-10-20 MED ORDER — GLUCOSE BLOOD VI STRP: 1.0000 | ORAL_STRIP | Freq: Two times a day (BID) | Status: DC

## 2010-10-20 NOTE — Telephone Encounter (Signed)
Pt was given an Accu-chek meter at last ov and is now out of test strips. Pt is req a script be called in to [facility] [place].

## 2010-11-05 LAB — CBC
Hemoglobin: 15.3
MCHC: 33.5
RBC: 5.18
WBC: 8.4

## 2010-11-05 LAB — BASIC METABOLIC PANEL
CO2: 30
Calcium: 9.5
Chloride: 103
Creatinine, Ser: 1.64 — ABNORMAL HIGH
GFR calc Af Amer: 51 — ABNORMAL LOW
Sodium: 141

## 2010-11-05 LAB — GLUCOSE, CAPILLARY
Glucose-Capillary: 122 — ABNORMAL HIGH
Glucose-Capillary: 145 — ABNORMAL HIGH

## 2010-11-19 ENCOUNTER — Other Ambulatory Visit: Payer: Self-pay | Admitting: Internal Medicine

## 2011-02-17 ENCOUNTER — Other Ambulatory Visit: Payer: Self-pay

## 2011-02-17 MED ORDER — GLIMEPIRIDE 4 MG PO TABS: 4.0000 mg | ORAL_TABLET | Freq: Every day | ORAL | Status: DC

## 2011-03-16 ENCOUNTER — Other Ambulatory Visit: Payer: Self-pay | Admitting: Internal Medicine

## 2011-03-27 ENCOUNTER — Other Ambulatory Visit: Payer: Self-pay

## 2011-03-27 MED ORDER — RAMIPRIL 10 MG PO CAPS: 10.0000 mg | ORAL_CAPSULE | Freq: Every day | ORAL | Status: DC

## 2011-03-27 MED ORDER — HYDROCHLOROTHIAZIDE 25 MG PO TABS: 25.0000 mg | ORAL_TABLET | Freq: Every day | ORAL | Status: DC

## 2011-05-20 ENCOUNTER — Encounter: Payer: Self-pay | Admitting: Internal Medicine

## 2011-05-20 ENCOUNTER — Ambulatory Visit (INDEPENDENT_AMBULATORY_CARE_PROVIDER_SITE_OTHER): Payer: Medicare Other | Admitting: Internal Medicine

## 2011-05-20 VITALS — BP 126/84 | Temp 97.7°F | Wt 200.0 lb

## 2011-05-20 DIAGNOSIS — E785 Hyperlipidemia, unspecified: Secondary | ICD-10-CM

## 2011-05-20 DIAGNOSIS — E119 Type 2 diabetes mellitus without complications: Secondary | ICD-10-CM

## 2011-05-20 DIAGNOSIS — I10 Essential (primary) hypertension: Secondary | ICD-10-CM

## 2011-05-20 DIAGNOSIS — I4891 Unspecified atrial fibrillation: Secondary | ICD-10-CM

## 2011-05-20 DIAGNOSIS — I2510 Atherosclerotic heart disease of native coronary artery without angina pectoris: Secondary | ICD-10-CM

## 2011-05-20 MED ORDER — GLUCOSE BLOOD VI STRP: 1.0000 | ORAL_STRIP | Freq: Two times a day (BID) | Status: DC

## 2011-05-20 MED ORDER — CLOPIDOGREL BISULFATE 75 MG PO TABS: 75.0000 mg | ORAL_TABLET | Freq: Every day | ORAL | Status: DC

## 2011-05-20 MED ORDER — GLIMEPIRIDE 4 MG PO TABS: 4.0000 mg | ORAL_TABLET | Freq: Every day | ORAL | Status: DC

## 2011-05-20 MED ORDER — HYDROCHLOROTHIAZIDE 25 MG PO TABS: 25.0000 mg | ORAL_TABLET | Freq: Every day | ORAL | Status: DC

## 2011-05-20 MED ORDER — DABIGATRAN ETEXILATE MESYLATE 150 MG PO CAPS: 150.0000 mg | ORAL_CAPSULE | Freq: Two times a day (BID) | ORAL | Status: DC

## 2011-05-20 MED ORDER — ATORVASTATIN CALCIUM 20 MG PO TABS: 20.0000 mg | ORAL_TABLET | Freq: Every day | ORAL | Status: DC

## 2011-05-20 MED ORDER — POTASSIUM CHLORIDE CRYS ER 20 MEQ PO TBCR: 20.0000 meq | EXTENDED_RELEASE_TABLET | Freq: Every day | ORAL | Status: DC

## 2011-05-20 MED ORDER — DILTIAZEM HCL ER COATED BEADS 120 MG PO CP24: 120.0000 mg | ORAL_CAPSULE | Freq: Every day | ORAL | Status: DC

## 2011-05-20 MED ORDER — METFORMIN HCL 1000 MG PO TABS: 1000.0000 mg | ORAL_TABLET | Freq: Two times a day (BID) | ORAL | Status: DC

## 2011-05-20 MED ORDER — RAMIPRIL 10 MG PO CAPS: 10.0000 mg | ORAL_CAPSULE | Freq: Every day | ORAL | Status: DC

## 2011-05-20 MED ORDER — NITROGLYCERIN 0.4 MG SL SUBL: 0.4000 mg | SUBLINGUAL_TABLET | SUBLINGUAL | Status: DC | PRN

## 2011-05-20 MED ORDER — METOPROLOL TARTRATE 100 MG PO TABS: 100.0000 mg | ORAL_TABLET | Freq: Two times a day (BID) | ORAL | Status: DC

## 2011-05-20 NOTE — Progress Notes (Signed)
  Subjective:    Patient ID: Jason Fisher, male    DOB: 11-Oct-1940, 71 y.o.   MRN: [id number]  HPI  71 year old patient who is seen today for followup of type 2 diabetes. Blood sugar ranges he states from 80-125.   No hypoglycemia. He has a history of coronary artery disease and paroxysmal atrial fibrillation and is scheduled to see cardiology in 2 weeks. History of hypertension and dyslipidemia. Denies any exertional chest pain. Medical regimen includes pradaxa. He remains on atorvastatin 20 mg daily which he tolerates well. Denies any cardiopulmonary complaints. No diabetic eye exam scheduled    Review of Systems  Constitutional: Negative for fever, chills, appetite change and fatigue.  HENT: Negative for hearing loss, ear pain, congestion, sore throat, trouble swallowing, neck stiffness, dental problem, voice change and tinnitus.   Eyes: Negative for pain, discharge and visual disturbance.  Respiratory: Negative for cough, chest tightness, wheezing and stridor.   Cardiovascular: Negative for chest pain, palpitations and leg swelling.  Gastrointestinal: Negative for nausea, vomiting, abdominal pain, diarrhea, constipation, blood in stool and abdominal distention.  Genitourinary: Negative for urgency, hematuria, flank pain, discharge, difficulty urinating and genital sores.  Musculoskeletal: Negative for myalgias, back pain, joint swelling, arthralgias and gait problem.  Skin: Negative for rash.  Neurological: Negative for dizziness, syncope, speech difficulty, weakness, numbness and headaches.  Hematological: Negative for adenopathy. Does not bruise/bleed easily.  Psychiatric/Behavioral: Negative for behavioral problems and dysphoric mood. The patient is not nervous/anxious.        Objective:   Physical Exam  Constitutional: He is oriented to person, place, and time. He appears well-developed.  HENT:  Head: Normocephalic.  Right Ear: External ear normal.  Left Ear: External ear  normal.  Eyes: Conjunctivae and EOM are normal.  Neck: Normal range of motion.  Cardiovascular: Normal rate and normal heart sounds.   Pulmonary/Chest: Effort normal. No respiratory distress. He has no wheezes.       Few scattered rhonchi  Abdominal: Bowel sounds are normal.  Musculoskeletal: Normal range of motion. He exhibits no edema and no tenderness.  Neurological: He is alert and oriented to person, place, and time.  Psychiatric: He has a normal mood and affect. His behavior is normal.          Assessment & Plan:   Diabetes mellitus. We'll check a hemoglobin A1c. Exercise weight loss encouraged Hypertension controlled  Coronary artery disease. Followup cardiology in 2 weeks clinically stable Dyslipidemia. Continue atorvastatin 20 mg daily  All medications refilled CPX in 6 months

## 2011-05-20 NOTE — Patient Instructions (Signed)
Limit your sodium (Salt) intake   Please check your hemoglobin A1c every 3 months    It is important that you exercise regularly, at least 20 minutes 3 to 4 times per week.  If you develop chest pain or shortness of breath seek  medical attention.  Please see your eye doctor yearly to check for diabetic eye damage  You need to lose weight.  Consider a lower calorie diet and regular exercise. 

## 2011-05-25 ENCOUNTER — Telehealth: Payer: Self-pay | Admitting: Internal Medicine

## 2011-05-25 NOTE — Telephone Encounter (Signed)
Pt called req refill of HYDROcodone-acetaminophen (VICODIN) 5-500 MG per tablet to [facility] [place].

## 2011-05-25 NOTE — Telephone Encounter (Signed)
Last seen 05/20/11 - 6 mos rov  vicodin has not been rx'd in this system  Please advise ok to RF and quanity

## 2011-05-25 NOTE — Telephone Encounter (Signed)
50

## 2011-05-26 MED ORDER — HYDROCODONE-ACETAMINOPHEN 5-500 MG PO TABS: 1.0000 | ORAL_TABLET | Freq: Four times a day (QID) | ORAL | Status: DC | PRN

## 2011-05-26 NOTE — Telephone Encounter (Signed)
Called in.

## 2011-05-29 ENCOUNTER — Other Ambulatory Visit: Payer: Self-pay | Admitting: Cardiology

## 2011-05-29 DIAGNOSIS — I714 Abdominal aortic aneurysm, without rupture: Secondary | ICD-10-CM

## 2011-06-08 ENCOUNTER — Encounter (INDEPENDENT_AMBULATORY_CARE_PROVIDER_SITE_OTHER): Payer: Medicare Other

## 2011-06-08 DIAGNOSIS — I714 Abdominal aortic aneurysm, without rupture: Secondary | ICD-10-CM

## 2011-06-10 ENCOUNTER — Encounter: Payer: Self-pay | Admitting: Cardiology

## 2011-06-10 ENCOUNTER — Ambulatory Visit (INDEPENDENT_AMBULATORY_CARE_PROVIDER_SITE_OTHER): Payer: Medicare Other | Admitting: Cardiology

## 2011-06-10 VITALS — BP 152/96 | HR 75 | Ht 71.0 in | Wt 203.0 lb

## 2011-06-10 DIAGNOSIS — I2510 Atherosclerotic heart disease of native coronary artery without angina pectoris: Secondary | ICD-10-CM

## 2011-06-10 DIAGNOSIS — I714 Abdominal aortic aneurysm, without rupture: Secondary | ICD-10-CM

## 2011-06-10 DIAGNOSIS — I4891 Unspecified atrial fibrillation: Secondary | ICD-10-CM

## 2011-06-10 DIAGNOSIS — E785 Hyperlipidemia, unspecified: Secondary | ICD-10-CM

## 2011-06-10 DIAGNOSIS — E1159 Type 2 diabetes mellitus with other circulatory complications: Secondary | ICD-10-CM

## 2011-06-10 DIAGNOSIS — E1151 Type 2 diabetes mellitus with diabetic peripheral angiopathy without gangrene: Secondary | ICD-10-CM

## 2011-06-10 DIAGNOSIS — I252 Old myocardial infarction: Secondary | ICD-10-CM

## 2011-06-10 DIAGNOSIS — I10 Essential (primary) hypertension: Secondary | ICD-10-CM

## 2011-06-10 DIAGNOSIS — I6529 Occlusion and stenosis of unspecified carotid artery: Secondary | ICD-10-CM

## 2011-06-10 DIAGNOSIS — I798 Other disorders of arteries, arterioles and capillaries in diseases classified elsewhere: Secondary | ICD-10-CM

## 2011-06-10 DIAGNOSIS — F17200 Nicotine dependence, unspecified, uncomplicated: Secondary | ICD-10-CM

## 2011-06-10 NOTE — Assessment & Plan Note (Signed)
Asymptomatic. Rate control and anticoagulation.

## 2011-06-10 NOTE — Progress Notes (Signed)
HPI Mr. Houseman returns today for evaluation and management of his diffuse vascular disease.  He does take his medicines. He doesn't check his blood sugar once a week. The last was 125. He continues to smoke a pack of cigarettes a day.  I do not see lipids or any significant blood work in over a year and a half. He does have primary care. I am not sure how compliant he is.  He denies any angina or chest pain. He does have some dyspnea on exertion or chronic cough. He denies orthopnea, PND or edema. He denies claudication.  Past Medical History  Diagnosis Date  . 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 Atrial fibrillation 11/06/2009  . BENIGN PROSTATIC HYPERTROPHY, HX OF 02/24/2007  . 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 COLONIC POLYPS, HX OF 11/02/2006  . CORONARY ARTERY DISEASE 11/02/2006  . DIABETES MELLITUS, TYPE II 10/29/2006  . DYSPNEA ON EXERTION 07/07/2008  . HYPERLIPIDEMIA 10/29/2006  . HYPERTENSION 10/29/2006  . MYOCARDIAL INFARCTION, HX OF 11/02/2006  . TOBACCO USER 04/17/2010    Current Outpatient Prescriptions  Medication Sig Dispense Refill  . amLODipine (NORVASC) 10 MG tablet Take 10 mg by mouth daily.      Marland Kitchen atorvastatin (LIPITOR) 20 MG tablet Take 1 tablet (20 mg total) by mouth daily.  90 tablet  2  . clopidogrel (PLAVIX) 75 MG tablet Take 1 tablet (75 mg total) by mouth daily.  90 tablet  3  . dabigatran (PRADAXA) 150 MG CAPS Take 1 capsule (150 mg total) by mouth every 12 (twelve) hours.  60 capsule  3  . diltiazem (CARDIZEM CD) 120 MG 24 hr capsule Take 1 capsule (120 mg total) by mouth daily.  90 capsule  3  . glimepiride (AMARYL) 4 MG tablet Take 1 tablet (4 mg total) by mouth daily before breakfast.  90 tablet  3  . glucose blood (ACCU-CHEK AVIVA) test strip 1 each by Other route 2 (two) times daily. Dx 250.00  100 each  12  . hydrochlorothiazide (HYDRODIURIL) 25 MG tablet Take 1 tablet (25 mg total) by mouth daily.  90 tablet  2  . HYDROcodone-acetaminophen (VICODIN)  5-500 MG per tablet Take 1 tablet by mouth every 6 (six) hours as needed.  50 tablet  0  . metFORMIN (GLUCOPHAGE) 1000 MG tablet Take 1 tablet (1,000 mg total) by mouth 2 (two) times daily with a meal.  180 tablet  6  . metoprolol (LOPRESSOR) 100 MG tablet Take 1 tablet (100 mg total) by mouth 2 (two) times daily.  90 tablet  6  . nitroGLYCERIN (NITROSTAT) 0.4 MG SL tablet Place 1 tablet (0.4 mg total) under the tongue every 5 (five) minutes as needed.  30 tablet  6  . potassium chloride SA (K-DUR,KLOR-CON) 20 MEQ tablet Take 1 tablet (20 mEq total) by mouth daily.  90 tablet  6  . ramipril (ALTACE) 10 MG capsule Take 1 capsule (10 mg total) by mouth daily.  90 capsule  2    No Known Allergies  No family history on file.  History   Social History  . Marital Status: Married    Spouse Name: N/A    Number of Children: N/A  . Years of Education: N/A   Occupational History  . Not on file.   Social History Main Topics  . Smoking status: Current Some Day Smoker  . Smokeless tobacco: Never Used   Comment: passive smoker as well  . Alcohol Use: Yes  .  Drug Use: No  . Sexually Active: Not on file   Other Topics Concern  . Not on file   Social History Narrative  . No narrative on file    ROS ALL NEGATIVE EXCEPT THOSE NOTED IN HPI  PE  General Appearance: well developed, well nourished in no acute distress, obese HEENT: symmetrical face, PERRLA, good dentition  Neck: no JVD, thyromegaly, or adenopathy, trachea midline Chest: symmetric without deformity Cardiac: PMI non-displaced, RRR, normal S1, S2, no gallop or murmur Lung: clear to ausculation and percussion Vascular: all pulses full without bruits  Abdominal: Distended, nontender, good bowel sounds, no HSM, no bruits Extremities: no cyanosis, clubbing or edema, no sign of DVT, no varicosities  Skin: normal color, no rashes Neuro: alert and oriented x 3, non-focal Pysch: normal affect  EKG Atrial fibrillation with a  well-controlled ventricular rate, nonspecific changes, no acute changes. BMET    Component Value Date/Time   NA 140 01/15/2010 0419   K 4.1 01/15/2010 0419   CL 106 01/15/2010 0419   CO2 29 01/15/2010 0419   GLUCOSE 148* 01/15/2010 0419   BUN 20 01/15/2010 0419   CREATININE 1.12 01/15/2010 0419   CALCIUM 9.4 01/15/2010 0419   GFRNONAA >60 01/15/2010 0419   GFRAA  Value: >60        The eGFR has been calculated using the MDRD equation. This calculation has not been validated in all clinical situations. eGFR's persistently <60 mL/min signify possible Chronic Kidney Disease. 01/15/2010 0419    Lipid Panel     Component Value Date/Time   CHOL  Value: 109        ATP III CLASSIFICATION:  <200     mg/dL   Desirable  454-098  mg/dL   Borderline High  >=119    mg/dL   High        14/78/2956 0555   TRIG 116 01/14/2010 0555   HDL 36* 01/14/2010 0555   CHOLHDL 3.0 01/14/2010 0555   VLDL 23 01/14/2010 0555   LDLCALC  Value: 50        Total Cholesterol/HDL:CHD Risk Coronary Heart Disease Risk Table                     Men   Women  1/2 Average Risk   3.4   3.3  Average Risk       5.0   4.4  2 X Average Risk   9.6   7.1  3 X Average Risk  23.4   11.0        Use the calculated Patient Ratio above and the CHD Risk Table to determine the patient's CHD Risk.        ATP III CLASSIFICATION (LDL):  <100     mg/dL   Optimal  213-086  mg/dL   Near or Above                    Optimal  130-159  mg/dL   Borderline  578-469  mg/dL   High  >629     mg/dL   Very High 52/84/1324 0555    CBC    Component Value Date/Time   WBC 11.5* 01/15/2010 0419   RBC 3.57* 01/15/2010 0419   HGB 10.3* 01/15/2010 0419   HCT 32.0* 01/15/2010 0419   PLT 241 01/15/2010 0419   MCV 89.6 01/15/2010 0419   MCH 28.9 01/15/2010 0419   MCHC 32.2 01/15/2010 0419   RDW 14.5 01/15/2010 0419  LYMPHSABS 3.9 01/13/2010 2358   MONOABS 1.2* 01/13/2010 2358   EOSABS 0.2 01/13/2010 2358   BASOSABS 0.1 01/13/2010 2358

## 2011-06-10 NOTE — Patient Instructions (Signed)
Your physician recommends that you return for lab work in: Monday May 13,2013 for fasting cholesterol, lft, bmp,HbA1C  Your physician recommends that you continue on your current medications as directed. Please refer to the Current Medication list given to you today.  Your physician wants you to follow-up in: 1 year. You will receive a reminder letter in the mail two months in advance. If you don't receive a letter, please call our office to schedule the follow-up appointment.

## 2011-06-10 NOTE — Assessment & Plan Note (Signed)
Will arrange for fasting blood work.

## 2011-06-10 NOTE — Assessment & Plan Note (Signed)
We'll check hemoglobin A1c. Encouraged to check blood sugar more often.

## 2011-06-10 NOTE — Assessment & Plan Note (Signed)
I do not hear any bruits. Continue aggressive secondary preventive therapy. Stop smoking.

## 2011-06-10 NOTE — Assessment & Plan Note (Signed)
Asymptomatic. Continue aggressive secondary preventive therapy. Strongly advised to stop smoking yet again.

## 2011-06-12 ENCOUNTER — Telehealth: Payer: Self-pay | Admitting: *Deleted

## 2011-06-12 DIAGNOSIS — I714 Abdominal aortic aneurysm, without rupture: Secondary | ICD-10-CM

## 2011-06-12 NOTE — Telephone Encounter (Signed)
Pt aware of abdominal ultrasound results and need to repeat this again in 6 months. Order placed  & recall placed in computer. Mylo Red RN

## 2011-06-15 ENCOUNTER — Other Ambulatory Visit (INDEPENDENT_AMBULATORY_CARE_PROVIDER_SITE_OTHER): Payer: Medicare Other

## 2011-06-15 DIAGNOSIS — E785 Hyperlipidemia, unspecified: Secondary | ICD-10-CM

## 2011-06-15 DIAGNOSIS — I798 Other disorders of arteries, arterioles and capillaries in diseases classified elsewhere: Secondary | ICD-10-CM

## 2011-06-15 DIAGNOSIS — E1151 Type 2 diabetes mellitus with diabetic peripheral angiopathy without gangrene: Secondary | ICD-10-CM

## 2011-06-15 DIAGNOSIS — E1159 Type 2 diabetes mellitus with other circulatory complications: Secondary | ICD-10-CM

## 2011-06-15 LAB — HEPATIC FUNCTION PANEL
ALT: 20 U/L (ref 0–53)
Alkaline Phosphatase: 60 U/L (ref 39–117)
Bilirubin, Direct: 0.1 mg/dL (ref 0.0–0.3)
Total Bilirubin: 0.7 mg/dL (ref 0.3–1.2)
Total Protein: 7.3 g/dL (ref 6.0–8.3)

## 2011-06-15 LAB — BASIC METABOLIC PANEL
BUN: 17 mg/dL (ref 6–23)
Chloride: 99 mEq/L (ref 96–112)
Creatinine, Ser: 1.1 mg/dL (ref 0.4–1.5)
Glucose, Bld: 194 mg/dL — ABNORMAL HIGH (ref 70–99)
Potassium: 3.8 mEq/L (ref 3.5–5.1)

## 2011-06-15 LAB — LIPID PANEL
Cholesterol: 141 mg/dL (ref 0–200)
LDL Cholesterol: 78 mg/dL (ref 0–99)

## 2011-06-15 LAB — HEMOGLOBIN A1C: Hgb A1c MFr Bld: 8.4 % — ABNORMAL HIGH (ref 4.6–6.5)

## 2011-11-12 ENCOUNTER — Other Ambulatory Visit (INDEPENDENT_AMBULATORY_CARE_PROVIDER_SITE_OTHER): Payer: Medicare Other

## 2011-11-12 DIAGNOSIS — Z0000 Encounter for general adult medical examination without abnormal findings: Secondary | ICD-10-CM

## 2011-11-12 DIAGNOSIS — Z79899 Other long term (current) drug therapy: Secondary | ICD-10-CM

## 2011-11-12 DIAGNOSIS — Z125 Encounter for screening for malignant neoplasm of prostate: Secondary | ICD-10-CM

## 2011-11-12 LAB — CBC WITH DIFFERENTIAL/PLATELET
Basophils Absolute: 0.1 10*3/uL (ref 0.0–0.1)
Eosinophils Absolute: 0.2 10*3/uL (ref 0.0–0.7)
Eosinophils Relative: 2.2 % (ref 0.0–5.0)
HCT: 47.6 % (ref 39.0–52.0)
Lymphs Abs: 2.8 10*3/uL (ref 0.7–4.0)
MCV: 88.8 fl (ref 78.0–100.0)
Monocytes Absolute: 0.7 10*3/uL (ref 0.1–1.0)
Neutrophils Relative %: 52.9 % (ref 43.0–77.0)
Platelets: 161 10*3/uL (ref 150.0–400.0)
RDW: 13.9 % (ref 11.5–14.6)
WBC: 7.9 10*3/uL (ref 4.5–10.5)

## 2011-11-12 LAB — TSH: TSH: 1.85 u[IU]/mL (ref 0.35–5.50)

## 2011-11-12 LAB — BASIC METABOLIC PANEL
Calcium: 9.5 mg/dL (ref 8.4–10.5)
GFR: 62.83 mL/min (ref >60.00)
Potassium: 3.5 mEq/L (ref 3.5–5.1)
Sodium: 140 mEq/L (ref 135–145)

## 2011-11-12 LAB — HEPATIC FUNCTION PANEL
ALT: 21 U/L (ref 0–53)
Total Protein: 7 g/dL (ref 6.0–8.3)

## 2011-11-12 LAB — LIPID PANEL
Cholesterol: 138 mg/dL (ref 0–200)
HDL: 37.9 mg/dL — ABNORMAL LOW (ref >39.00)
Triglycerides: 82 mg/dL (ref 0.0–149.0)
VLDL: 16.4 mg/dL (ref 0.0–40.0)

## 2011-11-12 LAB — POCT URINALYSIS DIPSTICK
Nitrite, UA: NEGATIVE
Urobilinogen, UA: 1
pH, UA: 5.5

## 2011-11-12 LAB — HEMOGLOBIN A1C: Hgb A1c MFr Bld: 8.2 % — ABNORMAL HIGH (ref 4.6–6.5)

## 2011-11-19 ENCOUNTER — Encounter: Payer: Medicare Other | Admitting: Internal Medicine

## 2011-12-10 ENCOUNTER — Encounter: Payer: Self-pay | Admitting: Internal Medicine

## 2011-12-10 ENCOUNTER — Ambulatory Visit (INDEPENDENT_AMBULATORY_CARE_PROVIDER_SITE_OTHER): Payer: Medicare Other | Admitting: Internal Medicine

## 2011-12-10 VITALS — BP 140/80 | HR 74 | Temp 97.5°F | Resp 20 | Ht 69.0 in | Wt 200.0 lb

## 2011-12-10 DIAGNOSIS — I10 Essential (primary) hypertension: Secondary | ICD-10-CM

## 2011-12-10 DIAGNOSIS — Z86010 Personal history of colonic polyps: Secondary | ICD-10-CM

## 2011-12-10 DIAGNOSIS — I2510 Atherosclerotic heart disease of native coronary artery without angina pectoris: Secondary | ICD-10-CM

## 2011-12-10 DIAGNOSIS — E1159 Type 2 diabetes mellitus with other circulatory complications: Secondary | ICD-10-CM

## 2011-12-10 DIAGNOSIS — F17200 Nicotine dependence, unspecified, uncomplicated: Secondary | ICD-10-CM

## 2011-12-10 DIAGNOSIS — I714 Abdominal aortic aneurysm, without rupture: Secondary | ICD-10-CM

## 2011-12-10 DIAGNOSIS — I4891 Unspecified atrial fibrillation: Secondary | ICD-10-CM

## 2011-12-10 DIAGNOSIS — E1151 Type 2 diabetes mellitus with diabetic peripheral angiopathy without gangrene: Secondary | ICD-10-CM

## 2011-12-10 DIAGNOSIS — Z0000 Encounter for general adult medical examination without abnormal findings: Secondary | ICD-10-CM

## 2011-12-10 DIAGNOSIS — I798 Other disorders of arteries, arterioles and capillaries in diseases classified elsewhere: Secondary | ICD-10-CM

## 2011-12-10 LAB — HM DIABETES FOOT EXAM

## 2011-12-10 MED ORDER — SILDENAFIL CITRATE 100 MG PO TABS: 50.0000 mg | ORAL_TABLET | Freq: Every day | ORAL | Status: DC | PRN

## 2011-12-10 MED ORDER — LIRAGLUTIDE 18 MG/3ML ~~LOC~~ SOLN: 0.6000 mL | SUBCUTANEOUS | Status: DC

## 2011-12-10 NOTE — Patient Instructions (Signed)
Limit your sodium (Salt) intake   Please check your hemoglobin A1c every 3 months  Smoking tobacco is very bad for your health. You should stop smoking immediately. 

## 2011-12-10 NOTE — Progress Notes (Signed)
Subjective:    Patient ID: Jason Fisher, male    DOB: 1941/01/10, 71 y.o.   MRN: [id number]  HPI 71 year old patient who is seen today for a preventive health examination. He is followed by cardiology with a history of CAD and atrial fibrillation. He has treated hypertension and dyslipidemia. He has type 2 diabetes.  Blood pressure is elevated today and he states that he did not take his medications yesterday. He has a history of colonic polyps as well as abdominal aortic aneurysm   he continues to smoke. Hemoglobin A1c greater than 8. He is very resistant to injection therapy  Here for Medicare AWV:  1. Risk factors based on Past M, S, F history: cardiovascular risk factors include hypertension, dyslipidemia, and diabetes, as well as a strong family history of coronary artery disease. The patient has documented CAD and prior MI  2. Physical Activities: no restrictions  3. Depression/mood:no history of depression or mood disorder  4. Hearing: no deficits  5. ADL's: independent in all aspects of daily living  6. Fall Risk: low  7. Home Safety: no problems identified  8. Height, weight, &visual acuity:height and weight stable. No change in visual acuity  9. Counseling: heart healthy diet more regular exercise and modest weight loss. All encouraged  10. Labs ordered based on risk factors: hemoglobin A1C and urine for microalbumin will be reviewed  11. Referral Coordination- follow-up cardiology, and GI  12. Care Plan- regular exercise modest weight loss encouraged  13. Cognitive Assessment- alert and oriented, with normal affect. No cognitive dysfunction   Allergies (verified):  No Known Drug Allergies   Past History:  Past Medical History:  Reviewed history from 12/19/2009 and no changes required.  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  father died age 49 acute MI  mother died age 47, acute MI  Two brothers, one died of an MI at age 41   Social History:  Reviewed history from 07/07/2008 and no changes required.  Married  Tobacco Use - Yes.  Alcohol Use - no  Drug Use - no    Review of Systems  Constitutional: Negative for fever, chills, activity change, appetite change and fatigue.  HENT: Negative for hearing loss, ear pain, congestion, rhinorrhea, sneezing, mouth sores, trouble swallowing, neck pain, neck stiffness, dental problem, voice change, sinus pressure and tinnitus.   Eyes: Negative for photophobia, pain, redness and visual disturbance.  Respiratory: Negative for apnea, cough, choking, chest tightness, shortness of breath and wheezing.   Cardiovascular: Negative for chest pain, palpitations and leg swelling.  Gastrointestinal: Negative for nausea, vomiting, abdominal pain, diarrhea, constipation, blood in stool, abdominal distention, anal bleeding and rectal pain.  Genitourinary: Negative for dysuria, urgency, frequency, hematuria, flank pain, decreased urine volume, discharge, penile swelling, scrotal swelling, difficulty urinating, genital sores and testicular pain.  Musculoskeletal: Negative for myalgias, back pain, joint swelling, arthralgias and gait problem.  Skin: Negative for color change, rash and wound.  Neurological: Negative for dizziness, tremors, seizures, syncope, facial asymmetry, speech difficulty, weakness, light-headedness, numbness and  headaches.  Hematological: Negative for adenopathy. Does not bruise/bleed easily.  Psychiatric/Behavioral: Negative for suicidal ideas, hallucinations, behavioral problems, confusion, sleep disturbance, self-injury, dysphoric mood, decreased concentration and agitation. The patient is not nervous/anxious.        Objective:   Physical Exam  Constitutional: He appears well-developed and well-nourished.       Blood pressure 170/100  HENT:  Head: Normocephalic and atraumatic.  Right Ear: External ear normal.  Left Ear: External ear normal.  Nose: Nose normal.  Mouth/Throat: Oropharynx is clear and moist.  Eyes: Conjunctivae normal and EOM are normal. Pupils are equal, round, and reactive to light. No scleral icterus.  Neck: Normal range of motion. Neck supple. No JVD present. No thyromegaly present.  Cardiovascular: Normal rate, normal heart sounds and intact distal pulses.  Exam reveals no gallop and no friction rub.   No murmur heard.      Irregular rhythm with controlled ventricular response  Pedal pulses are full  Pulmonary/Chest: Effort normal and breath sounds normal. He exhibits no tenderness.  Abdominal: Soft. Bowel sounds are normal. He exhibits no distension and no mass. There is no tenderness.  Genitourinary: Prostate normal and penis normal. Guaiac negative stool.  Musculoskeletal: Normal range of motion. He exhibits no edema and no tenderness.  Lymphadenopathy:    He has no cervical adenopathy.  Neurological: He is alert. He has normal reflexes. No cranial nerve deficit. Coordination normal.  Skin: Skin is warm and dry. No rash noted.  Psychiatric: He has a normal mood and affect. His behavior is normal.          Assessment & Plan:  Preventive health examination Abdominal aortic aneurysm. Will scheduled six-month followup ultrasound Hypertension poor control. Compliance stressed we'll increase diltiazem to 240 daily History of colonic polyps. We'll schedule followup  colonoscopy Diabetes mellitus poor control. Will start  Victoza. Recheck 3 weeks

## 2011-12-21 ENCOUNTER — Encounter (INDEPENDENT_AMBULATORY_CARE_PROVIDER_SITE_OTHER): Payer: Medicare Other

## 2011-12-21 DIAGNOSIS — I714 Abdominal aortic aneurysm, without rupture: Secondary | ICD-10-CM

## 2011-12-30 ENCOUNTER — Encounter: Payer: Self-pay | Admitting: Internal Medicine

## 2011-12-30 ENCOUNTER — Ambulatory Visit (INDEPENDENT_AMBULATORY_CARE_PROVIDER_SITE_OTHER): Payer: Medicare Other | Admitting: Internal Medicine

## 2011-12-30 VITALS — BP 180/100 | HR 80 | Temp 97.5°F | Resp 18 | Wt 200.0 lb

## 2011-12-30 DIAGNOSIS — E1151 Type 2 diabetes mellitus with diabetic peripheral angiopathy without gangrene: Secondary | ICD-10-CM

## 2011-12-30 DIAGNOSIS — E1159 Type 2 diabetes mellitus with other circulatory complications: Secondary | ICD-10-CM

## 2011-12-30 DIAGNOSIS — I10 Essential (primary) hypertension: Secondary | ICD-10-CM

## 2011-12-30 DIAGNOSIS — I798 Other disorders of arteries, arterioles and capillaries in diseases classified elsewhere: Secondary | ICD-10-CM

## 2011-12-30 LAB — GLUCOSE, POCT (MANUAL RESULT ENTRY): POC Glucose: 158 mg/dl — AB (ref 70–99)

## 2011-12-30 MED ORDER — CHLORTHALIDONE 25 MG PO TABS: 25.0000 mg | ORAL_TABLET | Freq: Every day | ORAL | Status: DC

## 2011-12-30 NOTE — Patient Instructions (Signed)
Limit your sodium (Salt) intake    It is important that you exercise regularly, at least 20 minutes 3 to 4 times per week.  If you develop chest pain or shortness of breath seek  medical attention.  You need to lose weight.  Consider a lower calorie diet and regular exercise. 

## 2011-12-30 NOTE — Progress Notes (Signed)
Subjective:    Patient ID: Jason Fisher, male    DOB: [birth year], 71 y.o.   MRN: [id number]  HPI  71 year old patient who is seen today for followup of diabetes. Recently he was placed on victoza and remains at the starting dose which he tolerates quite well. He has noted a nice reduction in blood sugars throughout the day. He feels well on this medication. He has treated hypertension on multiple medications blood pressure noted to be elevated today  Past Medical History  Diagnosis Date  . 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 Atrial fibrillation 11/06/2009  . BENIGN PROSTATIC HYPERTROPHY, HX OF 02/24/2007  . 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 COLONIC POLYPS, HX OF 11/02/2006  . CORONARY ARTERY DISEASE 11/02/2006  . DIABETES MELLITUS, TYPE II 10/29/2006  . DYSPNEA ON EXERTION 07/07/2008  . HYPERLIPIDEMIA 10/29/2006  . HYPERTENSION 10/29/2006  . MYOCARDIAL INFARCTION, HX OF 11/02/2006  . TOBACCO USER 04/17/2010    History   Social History  . Marital Status: Married    Spouse Name: N/A    Number of Children: N/A  . Years of Education: N/A   Occupational History  . Not on file.   Social History Main Topics  . Smoking status: Current Some Day Smoker  . Smokeless tobacco: Never Used     Comment: passive smoker as well  . Alcohol Use: Yes  . Drug Use: No  . Sexually Active: Not on file   Other Topics Concern  . Not on file   Social History Narrative  . No narrative on file    Past Surgical History  Procedure Date  . Cataract extraction   . Coronary angioplasty with stent placement   . Shoulder surgery     right    No family history on file.  No Known Allergies  Current Outpatient Prescriptions on File Prior to Visit  Medication Sig Dispense Refill  . amLODipine (NORVASC) 10 MG tablet Take 10 mg by mouth daily.      Marland Kitchen atorvastatin (LIPITOR) 20 MG tablet Take 1 tablet (20 mg total) by mouth daily.  90 tablet  2  . clopidogrel (PLAVIX) 75 MG tablet Take 1 tablet  (75 mg total) by mouth daily.  90 tablet  3  . dabigatran (PRADAXA) 150 MG CAPS Take 1 capsule (150 mg total) by mouth every 12 (twelve) hours.  60 capsule  3  . diltiazem (CARDIZEM CD) 120 MG 24 hr capsule Take 1 capsule (120 mg total) by mouth daily.  90 capsule  3  . glimepiride (AMARYL) 4 MG tablet Take 1 tablet (4 mg total) by mouth daily before breakfast.  90 tablet  3  . glucose blood (ACCU-CHEK AVIVA) test strip 1 each by Other route 2 (two) times daily. Dx 250.00  100 each  12  . HYDROcodone-acetaminophen (VICODIN) 5-500 MG per tablet Take 1 tablet by mouth every 6 (six) hours as needed.  50 tablet  0  . Liraglutide (VICTOZA) 18 MG/3ML SOLN Inject 0.6 mLs (3.6 mg total) into the skin 1 day or 1 dose.  6 mL  6  . metFORMIN (GLUCOPHAGE) 1000 MG tablet Take 1 tablet (1,000 mg total) by mouth 2 (two) times daily with a meal.  180 tablet  6  . metoprolol (LOPRESSOR) 100 MG tablet Take 1 tablet (100 mg total) by mouth 2 (two) times daily.  90 tablet  6  . nitroGLYCERIN (NITROSTAT) 0.4 MG SL tablet Place 1 tablet (0.4 mg total) under the  tongue every 5 (five) minutes as needed.  30 tablet  6  . potassium chloride SA (K-DUR,KLOR-CON) 20 MEQ tablet Take 1 tablet (20 mEq total) by mouth daily.  90 tablet  6  . ramipril (ALTACE) 10 MG capsule Take 1 capsule (10 mg total) by mouth daily.  90 capsule  2  . sildenafil (VIAGRA) 100 MG tablet Take 0.5-1 tablets (50-100 mg total) by mouth daily as needed for erectile dysfunction.  5 tablet  11  . chlorthalidone (HYGROTON) 25 MG tablet Take 1 tablet (25 mg total) by mouth daily.  90 tablet  4    BP 180/100  Pulse 80  Temp 97.5 F (36.4 C) (Oral)  Resp 18  Wt 200 lb (90.719 kg)  SpO2 94%       Review of Systems  Constitutional: Negative for fever, chills, appetite change and fatigue.  HENT: Negative for hearing loss, ear pain, congestion, sore throat, trouble swallowing, neck stiffness, dental problem, voice change and tinnitus.   Eyes:  Negative for pain, discharge and visual disturbance.  Respiratory: Negative for cough, chest tightness, wheezing and stridor.   Cardiovascular: Negative for chest pain, palpitations and leg swelling.  Gastrointestinal: Negative for nausea, vomiting, abdominal pain, diarrhea, constipation, blood in stool and abdominal distention.  Genitourinary: Negative for urgency, hematuria, flank pain, discharge, difficulty urinating and genital sores.  Musculoskeletal: Negative for myalgias, back pain, joint swelling, arthralgias and gait problem.  Skin: Negative for rash.  Neurological: Negative for dizziness, syncope, speech difficulty, weakness, numbness and headaches.  Hematological: Negative for adenopathy. Does not bruise/bleed easily.  Psychiatric/Behavioral: Negative for behavioral problems and dysphoric mood. The patient is not nervous/anxious.        Objective:   Physical Exam  Constitutional: He appears well-developed and well-nourished. No distress.       Blood pressure 160/100          Assessment & Plan:   Diabetes mellitus improved. We'll continue present regimen Hypertension we'll substitute chlorthalidone for hydrochlorothiazide and recheck next visit

## 2012-01-06 ENCOUNTER — Other Ambulatory Visit: Payer: Self-pay | Admitting: *Deleted

## 2012-01-06 MED ORDER — ACCU-CHEK MULTICLIX LANCETS MISC: Status: DC

## 2012-01-06 MED ORDER — INSULIN PEN NEEDLE 29G X 12.7MM MISC: 1.0000 | Status: DC

## 2012-01-06 MED ORDER — LIRAGLUTIDE 18 MG/3ML ~~LOC~~ SOLN: 0.6000 mL | SUBCUTANEOUS | Status: DC

## 2012-03-31 ENCOUNTER — Encounter: Payer: Self-pay | Admitting: Internal Medicine

## 2012-03-31 ENCOUNTER — Ambulatory Visit (INDEPENDENT_AMBULATORY_CARE_PROVIDER_SITE_OTHER): Payer: Medicare Other | Admitting: Internal Medicine

## 2012-03-31 VITALS — BP 152/90 | HR 68 | Temp 98.0°F | Resp 18 | Wt 197.0 lb

## 2012-03-31 DIAGNOSIS — I10 Essential (primary) hypertension: Secondary | ICD-10-CM

## 2012-03-31 DIAGNOSIS — E1159 Type 2 diabetes mellitus with other circulatory complications: Secondary | ICD-10-CM

## 2012-03-31 DIAGNOSIS — R229 Localized swelling, mass and lump, unspecified: Secondary | ICD-10-CM

## 2012-03-31 DIAGNOSIS — E785 Hyperlipidemia, unspecified: Secondary | ICD-10-CM

## 2012-03-31 DIAGNOSIS — I798 Other disorders of arteries, arterioles and capillaries in diseases classified elsewhere: Secondary | ICD-10-CM

## 2012-03-31 DIAGNOSIS — I4891 Unspecified atrial fibrillation: Secondary | ICD-10-CM

## 2012-03-31 DIAGNOSIS — F17200 Nicotine dependence, unspecified, uncomplicated: Secondary | ICD-10-CM

## 2012-03-31 DIAGNOSIS — E1151 Type 2 diabetes mellitus with diabetic peripheral angiopathy without gangrene: Secondary | ICD-10-CM

## 2012-03-31 DIAGNOSIS — I2510 Atherosclerotic heart disease of native coronary artery without angina pectoris: Secondary | ICD-10-CM

## 2012-03-31 LAB — HEMOGLOBIN A1C: Hgb A1c MFr Bld: 6.8 % — ABNORMAL HIGH (ref 4.6–6.5)

## 2012-03-31 NOTE — Progress Notes (Signed)
Subjective:    Patient ID: Jason Fisher, male    DOB: 1940-04-07, 72 y.o.   MRN: [id number]  HPI  72 year old patient who is seen today for followup. He has type 2 diabetes. Hemoglobin A1c was greater than 83 months ago and Victoza was added to his regimen. He remains on a starting 0.6 mg daily dose. He states fasting blood sugars generally run from 99-120. He states that he is almost completely discontinued tobacco products He has coronary artery disease which has been stable He has chronic atrial fibrillation and remains on anticoagulation. He is treated hypertension which has been controlled with multiple drugs last visit diltiazem was increased to 240 mg daily  Past Medical History  Diagnosis Date  . 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 Atrial fibrillation 11/06/2009  . BENIGN PROSTATIC HYPERTROPHY, HX OF 02/24/2007  . 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 COLONIC POLYPS, HX OF 11/02/2006  . CORONARY ARTERY DISEASE 11/02/2006  . DIABETES MELLITUS, TYPE II 10/29/2006  . DYSPNEA ON EXERTION 07/07/2008  . HYPERLIPIDEMIA 10/29/2006  . HYPERTENSION 10/29/2006  . MYOCARDIAL INFARCTION, HX OF 11/02/2006  . TOBACCO USER 04/17/2010    History   Social History  . Marital Status: Married    Spouse Name: N/A    Number of Children: N/A  . Years of Education: N/A   Occupational History  . Not on file.   Social History Main Topics  . Smoking status: Current Some Day Smoker  . Smokeless tobacco: Never Used     Comment: passive smoker as well  . Alcohol Use: Yes  . Drug Use: No  . Sexually Active: Not on file   Other Topics Concern  . Not on file   Social History Narrative  . No narrative on file    Past Surgical History  Procedure Laterality Date  . Cataract extraction    . Coronary angioplasty with stent placement    . Shoulder surgery      right    No family history on file.  No Known Allergies  Current Outpatient Prescriptions on File Prior to Visit  Medication Sig  Dispense Refill  . atorvastatin (LIPITOR) 20 MG tablet Take 1 tablet (20 mg total) by mouth daily.  90 tablet  2  . chlorthalidone (HYGROTON) 25 MG tablet Take 1 tablet (25 mg total) by mouth daily.  90 tablet  4  . clopidogrel (PLAVIX) 75 MG tablet Take 1 tablet (75 mg total) by mouth daily.  90 tablet  3  . dabigatran (PRADAXA) 150 MG CAPS Take 1 capsule (150 mg total) by mouth every 12 (twelve) hours.  60 capsule  3  . diltiazem (CARDIZEM CD) 120 MG 24 hr capsule Take 1 capsule (120 mg total) by mouth daily.  90 capsule  3  . glimepiride (AMARYL) 4 MG tablet Take 1 tablet (4 mg total) by mouth daily before breakfast.  90 tablet  3  . glucose blood (ACCU-CHEK AVIVA) test strip 1 each by Other route 2 (two) times daily. Dx 250.00  100 each  12  . HYDROcodone-acetaminophen (VICODIN) 5-500 MG per tablet Take 1 tablet by mouth every 6 (six) hours as needed.  50 tablet  0  . Insulin Pen Needle 29G X 12.7MM MISC 1 each by Does not apply route 1 day or 1 dose.  100 each  3  . Lancets (ACCU-CHEK MULTICLIX) lancets Once daily 250.00  204 each  1  . Liraglutide (VICTOZA) 18 MG/3ML SOLN Inject 0.6 mLs (  3.6 mg total) into the skin 1 day or 1 dose.  6 mL  6  . metFORMIN (GLUCOPHAGE) 1000 MG tablet Take 1 tablet (1,000 mg total) by mouth 2 (two) times daily with a meal.  180 tablet  6  . metoprolol (LOPRESSOR) 100 MG tablet Take 1 tablet (100 mg total) by mouth 2 (two) times daily.  90 tablet  6  . nitroGLYCERIN (NITROSTAT) 0.4 MG SL tablet Place 1 tablet (0.4 mg total) under the tongue every 5 (five) minutes as needed.  30 tablet  6  . potassium chloride SA (K-DUR,KLOR-CON) 20 MEQ tablet Take 1 tablet (20 mEq total) by mouth daily.  90 tablet  6  . ramipril (ALTACE) 10 MG capsule Take 1 capsule (10 mg total) by mouth daily.  90 capsule  2  . sildenafil (VIAGRA) 100 MG tablet Take 0.5-1 tablets (50-100 mg total) by mouth daily as needed for erectile dysfunction.  5 tablet  11   No current  facility-administered medications on file prior to visit.    BP 152/90  Pulse 68  Temp(Src) 98 F (36.7 C) (Oral)  Resp 18  Wt 197 lb (89.359 kg)  BMI 29.08 kg/m2  SpO2 96%      Review of Systems  Constitutional: Negative for fever, chills, appetite change and fatigue.  HENT: Negative for hearing loss, ear pain, congestion, sore throat, trouble swallowing, neck stiffness, dental problem, voice change and tinnitus.   Eyes: Negative for pain, discharge and visual disturbance.  Respiratory: Negative for cough, chest tightness, wheezing and stridor.   Cardiovascular: Negative for chest pain, palpitations and leg swelling.  Gastrointestinal: Negative for nausea, vomiting, abdominal pain, diarrhea, constipation, blood in stool and abdominal distention.  Genitourinary: Negative for urgency, hematuria, flank pain, discharge, difficulty urinating and genital sores.  Musculoskeletal: Negative for myalgias, back pain, joint swelling, arthralgias and gait problem.  Skin: Negative for rash.  Neurological: Negative for dizziness, syncope, speech difficulty, weakness, numbness and headaches.  Hematological: Negative for adenopathy. Does not bruise/bleed easily.  Psychiatric/Behavioral: Negative for behavioral problems and dysphoric mood. The patient is not nervous/anxious.        Objective:   Physical Exam  Constitutional: He is oriented to person, place, and time. He appears well-developed.  Weight 197 Repeat blood pressure 120/70  HENT:  Head: Normocephalic.  Right Ear: External ear normal.  Left Ear: External ear normal.  Eyes: Conjunctivae and EOM are normal.  Neck: Normal range of motion.  Cardiovascular: Normal rate and normal heart sounds.   Irregular rhythm with a controlled ventricular response  Pulmonary/Chest: Breath sounds normal.  Abdominal: Bowel sounds are normal.  Musculoskeletal: Normal range of motion. He exhibits no edema and no tenderness.  Neurological: He is  alert and oriented to person, place, and time.  Skin:  1 cm ulcerated nodule left preauricular area  Psychiatric: He has a normal mood and affect. His behavior is normal.          Assessment & Plan:   Diabetes mellitus. Appears to be better controlled. We'll check a hemoglobin A1c Hypertension stable Coronary artery disease Ongoing tobacco use. Total smoking cessation encouraged  Recheck 3 months

## 2012-03-31 NOTE — Patient Instructions (Signed)
Limit your sodium (Salt) intake    It is important that you exercise regularly, at least 20 minutes 3 to 4 times per week.  If you develop chest pain or shortness of breath seek  medical attention.  You need to lose weight.  Consider a lower calorie diet and regular exercise.   Please check your hemoglobin A1c every 3 months   

## 2012-04-23 ENCOUNTER — Other Ambulatory Visit: Payer: Self-pay | Admitting: Internal Medicine

## 2012-06-18 ENCOUNTER — Encounter: Payer: Self-pay | Admitting: Internal Medicine

## 2012-06-28 ENCOUNTER — Ambulatory Visit: Payer: Medicare Other | Admitting: Internal Medicine

## 2012-07-01 ENCOUNTER — Encounter: Payer: Self-pay | Admitting: Internal Medicine

## 2012-07-01 ENCOUNTER — Ambulatory Visit (INDEPENDENT_AMBULATORY_CARE_PROVIDER_SITE_OTHER): Payer: Medicare Other | Admitting: Internal Medicine

## 2012-07-01 VITALS — BP 140/80 | HR 64 | Temp 98.4°F | Resp 20 | Wt 196.0 lb

## 2012-07-01 DIAGNOSIS — E1159 Type 2 diabetes mellitus with other circulatory complications: Secondary | ICD-10-CM

## 2012-07-01 DIAGNOSIS — I798 Other disorders of arteries, arterioles and capillaries in diseases classified elsewhere: Secondary | ICD-10-CM

## 2012-07-01 DIAGNOSIS — I2510 Atherosclerotic heart disease of native coronary artery without angina pectoris: Secondary | ICD-10-CM

## 2012-07-01 DIAGNOSIS — E1151 Type 2 diabetes mellitus with diabetic peripheral angiopathy without gangrene: Secondary | ICD-10-CM

## 2012-07-01 DIAGNOSIS — I10 Essential (primary) hypertension: Secondary | ICD-10-CM

## 2012-07-01 DIAGNOSIS — I4891 Unspecified atrial fibrillation: Secondary | ICD-10-CM

## 2012-07-01 LAB — HEMOGLOBIN A1C: Hgb A1c MFr Bld: 7.5 % — ABNORMAL HIGH (ref 4.6–6.5)

## 2012-07-01 NOTE — Patient Instructions (Signed)
Limit your sodium (Salt) intake   Please check your hemoglobin A1c every 3 months   

## 2012-07-01 NOTE — Progress Notes (Signed)
  Subjective:    Patient ID: Jason Fisher, male    DOB: 01/02/1941, 72 y.o.   MRN: [id number]  HPI  73 year old patient who is seen today for followup. He has a history of diabetes which has been well-controlled since initiating Victoza. He remains on anticoagulation for atrial fibrillation. He has history of coronary artery disease which has been stable. He has dyslipidemia and hypertension. Denies any cardiopulmonary complaints. In general doing quite well. Lab Results  Component Value Date   HGBA1C 6.8* 03/31/2012   Wt Readings from Last 3 Encounters:  07/01/12 196 lb (88.905 kg)  03/31/12 197 lb (89.359 kg)  12/30/11 200 lb (90.719 kg)      Review of Systems  Constitutional: Negative for fever, chills, appetite change and fatigue.  HENT: Negative for hearing loss, ear pain, congestion, sore throat, trouble swallowing, neck stiffness, dental problem, voice change and tinnitus.   Eyes: Negative for pain, discharge and visual disturbance.  Respiratory: Negative for cough, chest tightness, wheezing and stridor.   Cardiovascular: Negative for chest pain, palpitations and leg swelling.  Gastrointestinal: Negative for nausea, vomiting, abdominal pain, diarrhea, constipation, blood in stool and abdominal distention.  Genitourinary: Negative for urgency, hematuria, flank pain, discharge, difficulty urinating and genital sores.  Musculoskeletal: Negative for myalgias, back pain, joint swelling, arthralgias and gait problem.  Skin: Negative for rash.  Neurological: Negative for dizziness, syncope, speech difficulty, weakness, numbness and headaches.  Hematological: Negative for adenopathy. Does not bruise/bleed easily.  Psychiatric/Behavioral: Negative for behavioral problems and dysphoric mood. The patient is not nervous/anxious.        Objective:   Physical Exam  Constitutional: He is oriented to person, place, and time. He appears well-developed.  HENT:  Head: Normocephalic.  Right  Ear: External ear normal.  Left Ear: External ear normal.  Eyes: Conjunctivae and EOM are normal.  Neck: Normal range of motion.  Cardiovascular: Normal rate and normal heart sounds.   Pulmonary/Chest: Breath sounds normal.  Abdominal: Bowel sounds are normal.  Musculoskeletal: Normal range of motion. He exhibits no edema and no tenderness.  Neurological: He is alert and oriented to person, place, and time.  Psychiatric: He has a normal mood and affect. His behavior is normal.          Assessment & Plan:  Diabetes mellitus. Appears to be well controlled. We'll check a hemoglobin A1c Hypertension well controlled Chronic atrial fibrillation. Continue anticoagulation therapy Coronary artery disease   Recheck 3 months

## 2012-07-04 ENCOUNTER — Telehealth: Payer: Self-pay | Admitting: Internal Medicine

## 2012-07-13 ENCOUNTER — Other Ambulatory Visit: Payer: Self-pay | Admitting: Internal Medicine

## 2012-07-25 ENCOUNTER — Encounter (INDEPENDENT_AMBULATORY_CARE_PROVIDER_SITE_OTHER): Payer: Medicare Other

## 2012-07-25 DIAGNOSIS — I700 Atherosclerosis of aorta: Secondary | ICD-10-CM

## 2012-07-25 DIAGNOSIS — I714 Abdominal aortic aneurysm, without rupture: Secondary | ICD-10-CM

## 2012-07-27 ENCOUNTER — Other Ambulatory Visit: Payer: Self-pay | Admitting: Internal Medicine

## 2012-08-15 ENCOUNTER — Other Ambulatory Visit: Payer: Self-pay | Admitting: Internal Medicine

## 2012-08-24 ENCOUNTER — Other Ambulatory Visit: Payer: Self-pay | Admitting: Internal Medicine

## 2012-08-31 ENCOUNTER — Other Ambulatory Visit: Payer: Self-pay | Admitting: *Deleted

## 2012-08-31 ENCOUNTER — Other Ambulatory Visit: Payer: Self-pay | Admitting: Internal Medicine

## 2012-08-31 MED ORDER — HYDROCODONE-ACETAMINOPHEN 5-325 MG PO TABS: 1.0000 | ORAL_TABLET | Freq: Four times a day (QID) | ORAL | Status: DC | PRN

## 2012-09-06 NOTE — Telephone Encounter (Signed)
Close encounter 

## 2012-10-07 ENCOUNTER — Ambulatory Visit (INDEPENDENT_AMBULATORY_CARE_PROVIDER_SITE_OTHER): Payer: Medicare Other | Admitting: Internal Medicine

## 2012-10-07 ENCOUNTER — Encounter: Payer: Self-pay | Admitting: Internal Medicine

## 2012-10-07 VITALS — BP 120/78 | HR 84 | Temp 97.4°F | Resp 20 | Wt 198.0 lb

## 2012-10-07 DIAGNOSIS — I2510 Atherosclerotic heart disease of native coronary artery without angina pectoris: Secondary | ICD-10-CM

## 2012-10-07 DIAGNOSIS — I798 Other disorders of arteries, arterioles and capillaries in diseases classified elsewhere: Secondary | ICD-10-CM

## 2012-10-07 DIAGNOSIS — E785 Hyperlipidemia, unspecified: Secondary | ICD-10-CM

## 2012-10-07 DIAGNOSIS — I10 Essential (primary) hypertension: Secondary | ICD-10-CM

## 2012-10-07 DIAGNOSIS — E1159 Type 2 diabetes mellitus with other circulatory complications: Secondary | ICD-10-CM

## 2012-10-07 DIAGNOSIS — E1151 Type 2 diabetes mellitus with diabetic peripheral angiopathy without gangrene: Secondary | ICD-10-CM

## 2012-10-07 NOTE — Progress Notes (Signed)
Subjective:    Patient ID: Jason Fisher, male    DOB: [birth year], 72 y.o.   MRN: [id number]  HPI  72 year old patient who is seen today for followup. Medical problems include hypertension dyslipidemia coronary artery disease. Remains on anticoagulation for atrial fibrillation. He has type 2 diabetes. Hemoglobin A1c 3 months ago increased to 7.5. Medical regimen includes Victoza at a submaximal dose of 0.6 mg daily. No weight loss. He says blood sugars generally run between 1:30 and 140 throughout the day  Past Medical History  Diagnosis Date  . 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 Atrial fibrillation 11/06/2009  . BENIGN PROSTATIC HYPERTROPHY, HX OF 02/24/2007  . 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 COLONIC POLYPS, HX OF 11/02/2006  . CORONARY ARTERY DISEASE 11/02/2006  . DIABETES MELLITUS, TYPE II 10/29/2006  . DYSPNEA ON EXERTION 07/07/2008  . HYPERLIPIDEMIA 10/29/2006  . HYPERTENSION 10/29/2006  . MYOCARDIAL INFARCTION, HX OF 11/02/2006  . TOBACCO USER 04/17/2010    History   Social History  . Marital Status: Married    Spouse Name: N/A    Number of Children: N/A  . Years of Education: N/A   Occupational History  . Not on file.   Social History Main Topics  . Smoking status: Current Some Day Smoker  . Smokeless tobacco: Never Used     Comment: passive smoker as well  . Alcohol Use: Yes  . Drug Use: No  . Sexual Activity: Not on file   Other Topics Concern  . Not on file   Social History Narrative  . No narrative on file    Past Surgical History  Procedure Laterality Date  . Cataract extraction    . Coronary angioplasty with stent placement    . Shoulder surgery      right    No family history on file.  No Known Allergies  Current Outpatient Prescriptions on File Prior to Visit  Medication Sig Dispense Refill  . ACCU-CHEK AVIVA PLUS test strip CHECK BLOOD SUGAR TWICE DAILY  100 each  3  . atorvastatin (LIPITOR) 20 MG tablet TAKE 1 TABLET EVERY DAY  90  tablet  1  . chlorthalidone (HYGROTON) 25 MG tablet Take 1 tablet (25 mg total) by mouth daily.  90 tablet  4  . clopidogrel (PLAVIX) 75 MG tablet TAKE 1 TABLET EVERY DAY  90 tablet  1  . diltiazem (CARDIZEM CD) 120 MG 24 hr capsule TAKE 1 CAPSULE EVERY DAY  90 capsule  1  . glimepiride (AMARYL) 4 MG tablet TAKE 1 TABLET EVERY MORNING BEFORE BREAKFAST  90 tablet  1  . HYDROcodone-acetaminophen (NORCO/VICODIN) 5-325 MG per tablet Take 1 tablet by mouth every 6 (six) hours as needed for pain.  50 tablet  0  . Insulin Pen Needle 29G X 12.7MM MISC 1 each by Does not apply route 1 day or 1 dose.  100 each  3  . Lancets (ACCU-CHEK MULTICLIX) lancets Once daily 250.00  204 each  1  . Liraglutide (VICTOZA) 18 MG/3ML SOLN Inject 0.6 mLs (3.6 mg total) into the skin 1 day or 1 dose.  6 mL  6  . metFORMIN (GLUCOPHAGE) 1000 MG tablet TAKE 1 TABLET TWICE A DAY WITH A MEAL  180 tablet  1  . metoprolol (LOPRESSOR) 100 MG tablet Take 1 tablet (100 mg total) by mouth 2 (two) times daily.  90 tablet  6  . nitroGLYCERIN (NITROSTAT) 0.4 MG SL tablet Place 1 tablet (0.4 mg total) under  the tongue every 5 (five) minutes as needed.  30 tablet  6  . PRADAXA 150 MG CAPS TAKE ONE CAPSULE BY MOUTH EVERY 12 HOURS  60 capsule  3  . ramipril (ALTACE) 10 MG capsule TAKE 1 CAPSULE EVERY DAY  90 capsule  1  . sildenafil (VIAGRA) 100 MG tablet Take 0.5-1 tablets (50-100 mg total) by mouth daily as needed for erectile dysfunction.  5 tablet  11   No current facility-administered medications on file prior to visit.    BP 136/90  Pulse 84  Temp(Src) 97.4 F (36.3 C) (Oral)  Resp 20  Wt 198 lb (89.812 kg)  BMI 29.23 kg/m2  SpO2 96%       Review of Systems  Constitutional: Negative for fever, chills, appetite change and fatigue.  HENT: Negative for hearing loss, ear pain, congestion, sore throat, trouble swallowing, neck stiffness, dental problem, voice change and tinnitus.   Eyes: Negative for pain, discharge and  visual disturbance.  Respiratory: Negative for cough, chest tightness, wheezing and stridor.   Cardiovascular: Negative for chest pain, palpitations and leg swelling.  Gastrointestinal: Negative for nausea, vomiting, abdominal pain, diarrhea, constipation, blood in stool and abdominal distention.  Genitourinary: Negative for urgency, hematuria, flank pain, discharge, difficulty urinating and genital sores.  Musculoskeletal: Negative for myalgias, back pain, joint swelling, arthralgias and gait problem.  Skin: Negative for rash.  Neurological: Negative for dizziness, syncope, speech difficulty, weakness, numbness and headaches.  Hematological: Negative for adenopathy. Does not bruise/bleed easily.  Psychiatric/Behavioral: Negative for behavioral problems and dysphoric mood. The patient is not nervous/anxious.        Objective:   Physical Exam  Constitutional: He is oriented to person, place, and time. He appears well-developed.  Repeat blood pressure 120/78  HENT:  Head: Normocephalic.  Right Ear: External ear normal.  Left Ear: External ear normal.  Eyes: Conjunctivae and EOM are normal.  Neck: Normal range of motion.  Cardiovascular: Normal rate and normal heart sounds.   Pulmonary/Chest: Breath sounds normal.  Abdominal: Bowel sounds are normal.  Musculoskeletal: Normal range of motion. He exhibits no edema and no tenderness.  Neurological: He is alert and oriented to person, place, and time.  Psychiatric: He has a normal mood and affect. His behavior is normal.          Assessment & Plan:   Diabetes mellitus. Will check a hemoglobin A1c. If not all of up titrate medications. Weight loss exercise encouraged Hypertension stable. Repeat blood pressure 120/78 Dyslipidemia. We'll check a lipid profile in 3 months CAD stable Atrial fibrillation. Continue anticoagulation

## 2012-10-07 NOTE — Progress Notes (Signed)
  Subjective:    Patient ID: Jason Fisher, male    DOB: [birth year], 72 y.o.   MRN: [id number]  HPI  Wt Readings from Last 3 Encounters:  10/07/12 198 lb (89.812 kg)  07/01/12 196 lb (88.905 kg)  03/31/12 197 lb (89.359 kg)    Lab Results  Component Value Date   HGBA1C 7.5* 07/01/2012    Review of Systems     Objective:   Physical Exam        Assessment & Plan:

## 2012-10-07 NOTE — Patient Instructions (Signed)
Limit your sodium (Salt) intake    It is important that you exercise regularly, at least 20 minutes 3 to 4 times per week.  If you develop chest pain or shortness of breath seek  medical attention.  You need to lose weight.  Consider a lower calorie diet and regular exercise.   Please check your hemoglobin A1c every 3 months   

## 2012-11-14 ENCOUNTER — Other Ambulatory Visit: Payer: Self-pay | Admitting: Internal Medicine

## 2012-11-14 MED ORDER — HYDROCODONE-ACETAMINOPHEN 5-325 MG PO TABS: 1.0000 | ORAL_TABLET | Freq: Four times a day (QID) | ORAL | Status: DC | PRN

## 2012-11-14 NOTE — Telephone Encounter (Signed)
Pt stopped by office for refill on Hydrocodone. Rx printed and signed given to pt.

## 2013-01-02 ENCOUNTER — Encounter: Payer: Self-pay | Admitting: Internal Medicine

## 2013-01-02 ENCOUNTER — Other Ambulatory Visit (INDEPENDENT_AMBULATORY_CARE_PROVIDER_SITE_OTHER): Payer: Medicare Other

## 2013-01-02 DIAGNOSIS — E785 Hyperlipidemia, unspecified: Secondary | ICD-10-CM

## 2013-01-02 DIAGNOSIS — E1159 Type 2 diabetes mellitus with other circulatory complications: Secondary | ICD-10-CM

## 2013-01-02 DIAGNOSIS — I10 Essential (primary) hypertension: Secondary | ICD-10-CM

## 2013-01-02 DIAGNOSIS — Z0000 Encounter for general adult medical examination without abnormal findings: Secondary | ICD-10-CM

## 2013-01-02 DIAGNOSIS — Z125 Encounter for screening for malignant neoplasm of prostate: Secondary | ICD-10-CM

## 2013-01-02 LAB — POCT URINALYSIS DIPSTICK
Blood, UA: NEGATIVE
Glucose, UA: NEGATIVE
Nitrite, UA: NEGATIVE
Spec Grav, UA: 1.025
Urobilinogen, UA: 1
pH, UA: 7

## 2013-01-02 LAB — LIPID PANEL
Cholesterol: 146 mg/dL (ref 0–200)
LDL Cholesterol: 79 mg/dL (ref 0–99)
Total CHOL/HDL Ratio: 4
Triglycerides: 165 mg/dL — ABNORMAL HIGH (ref 0.0–149.0)

## 2013-01-02 LAB — CBC WITH DIFFERENTIAL/PLATELET
Basophils Absolute: 0.1 10*3/uL (ref 0.0–0.1)
HCT: 46.4 % (ref 39.0–52.0)
Lymphs Abs: 2.8 10*3/uL (ref 0.7–4.0)
MCV: 87.4 fl (ref 78.0–100.0)
Monocytes Absolute: 0.7 10*3/uL (ref 0.1–1.0)
Neutrophils Relative %: 66.6 % (ref 43.0–77.0)
Platelets: 190 10*3/uL (ref 150.0–400.0)
RDW: 13 % (ref 11.5–14.6)

## 2013-01-02 LAB — HEPATIC FUNCTION PANEL
Bilirubin, Direct: 0.2 mg/dL (ref 0.0–0.3)
Total Bilirubin: 0.8 mg/dL (ref 0.3–1.2)

## 2013-01-02 LAB — BASIC METABOLIC PANEL
BUN: 17 mg/dL (ref 6–23)
Creatinine, Ser: 1.3 mg/dL (ref 0.4–1.5)
GFR: 60.32 mL/min (ref >60.00)
Glucose, Bld: 208 mg/dL — ABNORMAL HIGH (ref 70–99)
Potassium: 3.8 mEq/L (ref 3.5–5.1)

## 2013-01-02 LAB — TSH: TSH: 2.02 u[IU]/mL (ref 0.35–5.50)

## 2013-01-02 LAB — HEMOGLOBIN A1C: Hgb A1c MFr Bld: 8.1 % — ABNORMAL HIGH (ref 4.6–6.5)

## 2013-01-02 LAB — PSA: PSA: 0.57 ng/mL (ref 0.10–4.00)

## 2013-01-09 ENCOUNTER — Encounter: Payer: Self-pay | Admitting: Internal Medicine

## 2013-01-09 ENCOUNTER — Ambulatory Visit (INDEPENDENT_AMBULATORY_CARE_PROVIDER_SITE_OTHER): Payer: Medicare Other | Admitting: Internal Medicine

## 2013-01-09 VITALS — BP 160/100 | HR 68 | Temp 97.6°F | Resp 20 | Wt 196.0 lb

## 2013-01-09 DIAGNOSIS — I252 Old myocardial infarction: Secondary | ICD-10-CM

## 2013-01-09 DIAGNOSIS — Z23 Encounter for immunization: Secondary | ICD-10-CM

## 2013-01-09 DIAGNOSIS — E1151 Type 2 diabetes mellitus with diabetic peripheral angiopathy without gangrene: Secondary | ICD-10-CM

## 2013-01-09 DIAGNOSIS — E785 Hyperlipidemia, unspecified: Secondary | ICD-10-CM

## 2013-01-09 DIAGNOSIS — F17200 Nicotine dependence, unspecified, uncomplicated: Secondary | ICD-10-CM

## 2013-01-09 DIAGNOSIS — I10 Essential (primary) hypertension: Secondary | ICD-10-CM

## 2013-01-09 DIAGNOSIS — E1159 Type 2 diabetes mellitus with other circulatory complications: Secondary | ICD-10-CM

## 2013-01-09 DIAGNOSIS — I4891 Unspecified atrial fibrillation: Secondary | ICD-10-CM

## 2013-01-09 DIAGNOSIS — I798 Other disorders of arteries, arterioles and capillaries in diseases classified elsewhere: Secondary | ICD-10-CM

## 2013-01-09 MED ORDER — LIRAGLUTIDE 18 MG/3ML ~~LOC~~ SOPN: 0.6000 [IU] | PEN_INJECTOR | Freq: Every day | SUBCUTANEOUS | Status: DC

## 2013-01-09 MED ORDER — ACCU-CHEK MULTICLIX LANCETS MISC: Status: DC

## 2013-01-09 MED ORDER — HYDROCODONE-ACETAMINOPHEN 5-325 MG PO TABS: 1.0000 | ORAL_TABLET | Freq: Four times a day (QID) | ORAL | Status: DC | PRN

## 2013-01-09 MED ORDER — GLIMEPIRIDE 4 MG PO TABS: ORAL_TABLET | ORAL | Status: DC

## 2013-01-09 MED ORDER — DILTIAZEM HCL ER COATED BEADS 120 MG PO CP24: ORAL_CAPSULE | ORAL | Status: DC

## 2013-01-09 MED ORDER — CHLORTHALIDONE 25 MG PO TABS: 25.0000 mg | ORAL_TABLET | Freq: Every day | ORAL | Status: DC

## 2013-01-09 MED ORDER — METOPROLOL TARTRATE 100 MG PO TABS: ORAL_TABLET | ORAL | Status: DC

## 2013-01-09 MED ORDER — ATORVASTATIN CALCIUM 20 MG PO TABS: ORAL_TABLET | ORAL | Status: DC

## 2013-01-09 MED ORDER — DABIGATRAN ETEXILATE MESYLATE 150 MG PO CAPS: ORAL_CAPSULE | ORAL | Status: DC

## 2013-01-09 MED ORDER — CLOPIDOGREL BISULFATE 75 MG PO TABS: ORAL_TABLET | ORAL | Status: DC

## 2013-01-09 MED ORDER — RAMIPRIL 10 MG PO CAPS: ORAL_CAPSULE | ORAL | Status: DC

## 2013-01-09 MED ORDER — GLUCOSE BLOOD VI STRP: ORAL_STRIP | Status: DC

## 2013-01-09 MED ORDER — METFORMIN HCL 1000 MG PO TABS: ORAL_TABLET | ORAL | Status: DC

## 2013-01-09 NOTE — Progress Notes (Signed)
Pre-visit discussion using our clinic review tool. No additional management support is needed unless otherwise documented below in the visit note.  

## 2013-01-09 NOTE — Progress Notes (Signed)
Subjective:    Patient ID: Jason Fisher, male    DOB: 1940-07-25, 72 y.o.   MRN: [id number]  HPI 72 year old patient who is seen today for followup. He has type 2 diabetes her recent hemoglobin A1c has increased to greater than 8. There has been no weight loss since his last visit. He has been compliant with his medications. History of hypertension which is elevated today. He has been taking decongestants for a recent URI. He has chronic atrial for ablation remains on anticoagulation. Remains on atorvastatin for dyslipidemia. Denies any cardiopulmonary complaints. No fever. Cough is nonproductive.  Past Medical History  Diagnosis Date  . 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 Atrial fibrillation 11/06/2009  . BENIGN PROSTATIC HYPERTROPHY, HX OF 02/24/2007  . 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 COLONIC POLYPS, HX OF 11/02/2006  . CORONARY ARTERY DISEASE 11/02/2006  . DIABETES MELLITUS, TYPE II 10/29/2006  . DYSPNEA ON EXERTION 07/07/2008  . HYPERLIPIDEMIA 10/29/2006  . HYPERTENSION 10/29/2006  . MYOCARDIAL INFARCTION, HX OF 11/02/2006  . TOBACCO USER 04/17/2010    History   Social History  . Marital Status: Married    Spouse Name: N/A    Number of Children: N/A  . Years of Education: N/A   Occupational History  . Not on file.   Social History Main Topics  . Smoking status: Current Some Day Smoker  . Smokeless tobacco: Never Used     Comment: passive smoker as well  . Alcohol Use: Yes  . Drug Use: No  . Sexual Activity: Not on file   Other Topics Concern  . Not on file   Social History Narrative  . No narrative on file    Past Surgical History  Procedure Laterality Date  . Cataract extraction    . Coronary angioplasty with stent placement    . Shoulder surgery      right    No family history on file.  No Known Allergies  Current Outpatient Prescriptions on File Prior to Visit  Medication Sig Dispense Refill  . ACCU-CHEK AVIVA PLUS test strip CHECK BLOOD  SUGAR TWICE DAILY  100 each  3  . atorvastatin (LIPITOR) 20 MG tablet TAKE 1 TABLET EVERY DAY  90 tablet  1  . chlorthalidone (HYGROTON) 25 MG tablet Take 1 tablet (25 mg total) by mouth daily.  90 tablet  4  . clopidogrel (PLAVIX) 75 MG tablet TAKE 1 TABLET EVERY DAY  90 tablet  1  . diltiazem (CARDIZEM CD) 120 MG 24 hr capsule TAKE 1 CAPSULE EVERY DAY  90 capsule  1  . glimepiride (AMARYL) 4 MG tablet TAKE 1 TABLET EVERY MORNING BEFORE BREAKFAST  90 tablet  1  . HYDROcodone-acetaminophen (NORCO/VICODIN) 5-325 MG per tablet Take 1 tablet by mouth every 6 (six) hours as needed for pain.  50 tablet  0  . Insulin Pen Needle 29G X 12.7MM MISC 1 each by Does not apply route 1 day or 1 dose.  100 each  3  . Lancets (ACCU-CHEK MULTICLIX) lancets Once daily 250.00  204 each  1  . Liraglutide (VICTOZA) 18 MG/3ML SOLN Inject 0.6 mLs (3.6 mg total) into the skin 1 day or 1 dose.  6 mL  6  . metFORMIN (GLUCOPHAGE) 1000 MG tablet TAKE 1 TABLET TWICE A DAY WITH A MEAL  180 tablet  1  . metoprolol (LOPRESSOR) 100 MG tablet TAKE 1 TABLET TWICE DAILY  180 tablet  1  . nitroGLYCERIN (NITROSTAT) 0.4 MG  SL tablet Place 1 tablet (0.4 mg total) under the tongue every 5 (five) minutes as needed.  30 tablet  6  . PRADAXA 150 MG CAPS TAKE ONE CAPSULE BY MOUTH EVERY 12 HOURS  60 capsule  3  . ramipril (ALTACE) 10 MG capsule TAKE 1 CAPSULE EVERY DAY  90 capsule  1  . sildenafil (VIAGRA) 100 MG tablet Take 0.5-1 tablets (50-100 mg total) by mouth daily as needed for erectile dysfunction.  5 tablet  11   No current facility-administered medications on file prior to visit.    BP 160/100  Pulse 68  Temp(Src) 97.6 F (36.4 C) (Oral)  Resp 20  Wt 196 lb (88.905 kg)  SpO2 98%       Review of Systems  Constitutional: Negative for fever, chills, appetite change and fatigue.  HENT: Negative for congestion, dental problem, ear pain, hearing loss, sore throat, tinnitus, trouble swallowing and voice change.   Eyes:  Negative for pain, discharge and visual disturbance.  Respiratory: Positive for cough. Negative for chest tightness, wheezing and stridor.   Cardiovascular: Negative for chest pain, palpitations and leg swelling.  Gastrointestinal: Negative for nausea, vomiting, abdominal pain, diarrhea, constipation, blood in stool and abdominal distention.  Genitourinary: Negative for urgency, hematuria, flank pain, discharge, difficulty urinating and genital sores.  Musculoskeletal: Negative for arthralgias, back pain, gait problem, joint swelling, myalgias and neck stiffness.  Skin: Negative for rash.  Neurological: Negative for dizziness, syncope, speech difficulty, weakness, numbness and headaches.  Hematological: Negative for adenopathy. Does not bruise/bleed easily.  Psychiatric/Behavioral: Negative for behavioral problems and dysphoric mood. The patient is not nervous/anxious.        Objective:   Physical Exam  Constitutional: He is oriented to person, place, and time. He appears well-developed.  Blood pressure 150/94  HENT:  Head: Normocephalic.  Right Ear: External ear normal.  Left Ear: External ear normal.  Eyes: Conjunctivae and EOM are normal.  Neck: Normal range of motion.  Cardiovascular: Normal rate and normal heart sounds.   Pulmonary/Chest: Breath sounds normal.  Abdominal: Bowel sounds are normal.  Musculoskeletal: Normal range of motion. He exhibits no edema and no tenderness.  Neurological: He is alert and oriented to person, place, and time.  Psychiatric: He has a normal mood and affect. His behavior is normal.          Assessment & Plan:   Diabetes mellitus. Poor control. Weight loss exercise encouraged. We'll recheck in 3 months. May need to initiate insulin therapy at that time Hypertension blood pressure elevated today. May be in part related to URI and decongestant use. Blood pressure has been running well at home Obesity. Weight loss exercise encouraged chronic  atrial fibrillation Dyslipidemia. Continue atorvastatin  Recheck 3 months

## 2013-01-20 ENCOUNTER — Encounter: Payer: Self-pay | Admitting: Internal Medicine

## 2013-02-03 ENCOUNTER — Ambulatory Visit (HOSPITAL_COMMUNITY): Payer: Medicare Other | Attending: Cardiology

## 2013-02-03 ENCOUNTER — Encounter: Payer: Self-pay | Admitting: Cardiology

## 2013-02-03 DIAGNOSIS — F17200 Nicotine dependence, unspecified, uncomplicated: Secondary | ICD-10-CM | POA: Insufficient documentation

## 2013-02-03 DIAGNOSIS — I714 Abdominal aortic aneurysm, without rupture, unspecified: Secondary | ICD-10-CM | POA: Insufficient documentation

## 2013-02-03 DIAGNOSIS — E119 Type 2 diabetes mellitus without complications: Secondary | ICD-10-CM | POA: Insufficient documentation

## 2013-02-03 DIAGNOSIS — I739 Peripheral vascular disease, unspecified: Secondary | ICD-10-CM | POA: Insufficient documentation

## 2013-02-03 DIAGNOSIS — E785 Hyperlipidemia, unspecified: Secondary | ICD-10-CM | POA: Insufficient documentation

## 2013-02-03 DIAGNOSIS — I700 Atherosclerosis of aorta: Secondary | ICD-10-CM | POA: Insufficient documentation

## 2013-02-03 DIAGNOSIS — I10 Essential (primary) hypertension: Secondary | ICD-10-CM | POA: Insufficient documentation

## 2013-02-25 ENCOUNTER — Other Ambulatory Visit: Payer: Self-pay | Admitting: Internal Medicine

## 2013-02-27 ENCOUNTER — Other Ambulatory Visit: Payer: Self-pay | Admitting: Internal Medicine

## 2013-04-05 ENCOUNTER — Other Ambulatory Visit: Payer: Self-pay | Admitting: Internal Medicine

## 2013-04-17 ENCOUNTER — Ambulatory Visit: Payer: Medicare Other | Admitting: Internal Medicine

## 2013-04-25 ENCOUNTER — Ambulatory Visit (INDEPENDENT_AMBULATORY_CARE_PROVIDER_SITE_OTHER): Payer: Medicare Other | Admitting: Internal Medicine

## 2013-04-25 ENCOUNTER — Encounter: Payer: Self-pay | Admitting: Internal Medicine

## 2013-04-25 VITALS — BP 132/80 | HR 75 | Temp 97.4°F | Resp 20 | Ht 69.0 in | Wt 192.0 lb

## 2013-04-25 DIAGNOSIS — E1159 Type 2 diabetes mellitus with other circulatory complications: Secondary | ICD-10-CM

## 2013-04-25 DIAGNOSIS — I4891 Unspecified atrial fibrillation: Secondary | ICD-10-CM

## 2013-04-25 DIAGNOSIS — E1151 Type 2 diabetes mellitus with diabetic peripheral angiopathy without gangrene: Secondary | ICD-10-CM

## 2013-04-25 DIAGNOSIS — I10 Essential (primary) hypertension: Secondary | ICD-10-CM

## 2013-04-25 DIAGNOSIS — I252 Old myocardial infarction: Secondary | ICD-10-CM

## 2013-04-25 DIAGNOSIS — E785 Hyperlipidemia, unspecified: Secondary | ICD-10-CM

## 2013-04-25 LAB — HEMOGLOBIN A1C: Hgb A1c MFr Bld: 7.7 % — ABNORMAL HIGH (ref 4.6–6.5)

## 2013-04-25 NOTE — Patient Instructions (Signed)
It is important that you exercise regularly, at least 20 minutes 3 to 4 times per week.  If you develop chest pain or shortness of breath seek  medical attention.   Please check your hemoglobin A1c every 3 months  Limit your sodium (Salt) intake   

## 2013-04-25 NOTE — Progress Notes (Signed)
Pre-visit discussion using our clinic review tool. No additional management support is needed unless otherwise documented below in the visit note.  

## 2013-04-25 NOTE — Progress Notes (Signed)
Subjective:    Patient ID: Jason Fisher, male    DOB: [birth year], 73 y.o.   MRN: [id number]  HPI  73 year old patient who is in today for followup of type 2 diabetes.  Insulin.  A1c 3 months ago was greater than 8 and Victoza added to his regimen.  He is maintained.  Much improved.  Glycemic control.  He is followed by cardiology due to 2 coronary artery disease, prior MI, and atrial fibrillation.  He remains on anticoagulation.  He is also on Plavix. He is treated hypertension, which has done well.  Blood pressure well controlled today.  No hypoglycemia. Cardiac status stable  Past Medical History  Diagnosis Date  . 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 Atrial fibrillation 11/06/2009  . BENIGN PROSTATIC HYPERTROPHY, HX OF 02/24/2007  . 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 COLONIC POLYPS, HX OF 11/02/2006  . CORONARY ARTERY DISEASE 11/02/2006  . DIABETES MELLITUS, TYPE II 10/29/2006  . DYSPNEA ON EXERTION 07/07/2008  . HYPERLIPIDEMIA 10/29/2006  . HYPERTENSION 10/29/2006  . MYOCARDIAL INFARCTION, HX OF 11/02/2006  . TOBACCO USER 04/17/2010    History   Social History  . Marital Status: Married    Spouse Name: N/A    Number of Children: N/A  . Years of Education: N/A   Occupational History  . Not on file.   Social History Main Topics  . Smoking status: Current Some Day Smoker  . Smokeless tobacco: Never Used     Comment: passive smoker as well  . Alcohol Use: Yes  . Drug Use: No  . Sexual Activity: Not on file   Other Topics Concern  . Not on file   Social History Narrative  . No narrative on file    Past Surgical History  Procedure Laterality Date  . Cataract extraction    . Coronary angioplasty with stent placement    . Shoulder surgery      right    No family history on file.  No Known Allergies  Current Outpatient Prescriptions on File Prior to Visit  Medication Sig Dispense Refill  . atorvastatin (LIPITOR) 20 MG tablet TAKE 1 TABLET EVERY DAY  90  tablet  1  . chlorthalidone (HYGROTON) 25 MG tablet Take 1 tablet (25 mg total) by mouth daily.  90 tablet  1  . chlorthalidone (HYGROTON) 25 MG tablet TAKE 1 TABLET BY MOUTH DAILY  90 tablet  3  . clopidogrel (PLAVIX) 75 MG tablet TAKE 1 TABLET EVERY DAY  90 tablet  1  . dabigatran (PRADAXA) 150 MG CAPS capsule TAKE ONE CAPSULE BY MOUTH EVERY 12 HOURS  60 capsule  3  . diltiazem (CARDIZEM CD) 120 MG 24 hr capsule TAKE 1 CAPSULE EVERY DAY  90 capsule  1  . glimepiride (AMARYL) 4 MG tablet TAKE 1 TABLET EVERY MORNING BEFORE BREAKFAST  90 tablet  1  . glucose blood (ACCU-CHEK AVIVA PLUS) test strip CHECK BLOOD SUGAR ONCE DAILY PRN  100 each  3  . HYDROcodone-acetaminophen (NORCO/VICODIN) 5-325 MG per tablet Take 1 tablet by mouth every 6 (six) hours as needed.  50 tablet  0  . Insulin Pen Needle 29G X 12.7MM MISC 1 each by Does not apply route 1 day or 1 dose.  100 each  3  . Lancets (ACCU-CHEK MULTICLIX) lancets Once daily 250.00  100 each  3  . metFORMIN (GLUCOPHAGE) 1000 MG tablet TAKE 1 TABLET TWICE A DAY WITH A MEAL  180 tablet  1  .  metoprolol (LOPRESSOR) 100 MG tablet TAKE 1 TABLET TWICE DAILY  180 tablet  1  . nitroGLYCERIN (NITROSTAT) 0.4 MG SL tablet Place 1 tablet (0.4 mg total) under the tongue every 5 (five) minutes as needed.  30 tablet  6  . ramipril (ALTACE) 10 MG capsule TAKE 1 CAPSULE EVERY DAY  90 capsule  1  . sildenafil (VIAGRA) 100 MG tablet Take 0.5-1 tablets (50-100 mg total) by mouth daily as needed for erectile dysfunction.  5 tablet  11  . VICTOZA 18 MG/3ML SOPN INJECT 0.6 MLS INTO THE SKIN 1 DAY FOR 1 DOSE  6 pen  3   No current facility-administered medications on file prior to visit.    BP 132/80  Pulse 75  Temp(Src) 97.4 F (36.3 C) (Oral)  Resp 20  Ht 5\' 9"  (1.753 m)  Wt 192 lb (87.091 kg)  BMI 28.34 kg/m2  SpO2 96%       Review of Systems     Objective:   Physical Exam        Assessment & Plan:

## 2013-04-26 ENCOUNTER — Telehealth: Payer: Self-pay | Admitting: Internal Medicine

## 2013-04-26 NOTE — Telephone Encounter (Signed)
Relevant patient education mailed to patient.  

## 2013-05-04 ENCOUNTER — Telehealth: Payer: Self-pay

## 2013-05-04 NOTE — Telephone Encounter (Signed)
Relevant patient education mailed to patient.  

## 2013-07-31 ENCOUNTER — Ambulatory Visit (INDEPENDENT_AMBULATORY_CARE_PROVIDER_SITE_OTHER): Payer: Medicare Other | Admitting: Internal Medicine

## 2013-07-31 ENCOUNTER — Other Ambulatory Visit: Payer: Self-pay | Admitting: *Deleted

## 2013-07-31 ENCOUNTER — Encounter: Payer: Self-pay | Admitting: Internal Medicine

## 2013-07-31 VITALS — BP 140/90 | HR 81 | Temp 97.9°F | Resp 20 | Ht 69.0 in | Wt 188.0 lb

## 2013-07-31 DIAGNOSIS — F17200 Nicotine dependence, unspecified, uncomplicated: Secondary | ICD-10-CM

## 2013-07-31 DIAGNOSIS — I480 Paroxysmal atrial fibrillation: Secondary | ICD-10-CM

## 2013-07-31 DIAGNOSIS — E1151 Type 2 diabetes mellitus with diabetic peripheral angiopathy without gangrene: Secondary | ICD-10-CM

## 2013-07-31 DIAGNOSIS — E1159 Type 2 diabetes mellitus with other circulatory complications: Secondary | ICD-10-CM

## 2013-07-31 DIAGNOSIS — I10 Essential (primary) hypertension: Secondary | ICD-10-CM

## 2013-07-31 DIAGNOSIS — Z23 Encounter for immunization: Secondary | ICD-10-CM

## 2013-07-31 DIAGNOSIS — I4891 Unspecified atrial fibrillation: Secondary | ICD-10-CM

## 2013-07-31 DIAGNOSIS — I798 Other disorders of arteries, arterioles and capillaries in diseases classified elsewhere: Secondary | ICD-10-CM

## 2013-07-31 DIAGNOSIS — I2510 Atherosclerotic heart disease of native coronary artery without angina pectoris: Secondary | ICD-10-CM

## 2013-07-31 DIAGNOSIS — E785 Hyperlipidemia, unspecified: Secondary | ICD-10-CM

## 2013-07-31 LAB — HEMOGLOBIN A1C: Hgb A1c MFr Bld: 7.8 % — ABNORMAL HIGH (ref 4.6–6.5)

## 2013-07-31 MED ORDER — HYDROCODONE-ACETAMINOPHEN 5-325 MG PO TABS: 1.0000 | ORAL_TABLET | Freq: Four times a day (QID) | ORAL | Status: DC | PRN

## 2013-07-31 MED ORDER — LIRAGLUTIDE 18 MG/3ML ~~LOC~~ SOPN: PEN_INJECTOR | SUBCUTANEOUS | Status: DC

## 2013-07-31 NOTE — Patient Instructions (Signed)
Limit your sodium (Salt) intake  Smoking tobacco is very bad for your health. You should stop smoking immediately.   Please check your hemoglobin A1c every 3 months    It is important that you exercise regularly, at least 20 minutes 3 to 4 times per week.  If you develop chest pain or shortness of breath seek  medical attention.  You need to lose weight.  Consider a lower calorie diet and regular exercise.

## 2013-07-31 NOTE — Progress Notes (Signed)
Pre-visit discussion using our clinic review tool. No additional management support is needed unless otherwise documented below in the visit note.  

## 2013-07-31 NOTE — Progress Notes (Signed)
Subjective:    Patient ID: Jason Fisher, male    DOB: 1940/07/11, 73 y.o.   MRN: [id number]  HPI  73 year old patient who is seen today for followup.  He is followed by cardiology with coronary artery disease, and atrial for ablation.  Remains on anticoagulation.  He has type 2 diabetes, which is controlled on oral medications as well as Victoza 0.6 mg daily.  He has treated hypertension and dyslipidemia.  He remains on atorvastatin, which he tolerates well.  No cardiopulmonary complaints.  No recent eye examination.  Past Medical History  Diagnosis Date  . 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 Atrial fibrillation 11/06/2009  . BENIGN PROSTATIC HYPERTROPHY, HX OF 02/24/2007  . 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 COLONIC POLYPS, HX OF 11/02/2006  . CORONARY ARTERY DISEASE 11/02/2006  . DIABETES MELLITUS, TYPE II 10/29/2006  . DYSPNEA ON EXERTION 07/07/2008  . HYPERLIPIDEMIA 10/29/2006  . HYPERTENSION 10/29/2006  . MYOCARDIAL INFARCTION, HX OF 11/02/2006  . TOBACCO USER 04/17/2010    History   Social History  . Marital Status: Married    Spouse Name: N/A    Number of Children: N/A  . Years of Education: N/A   Occupational History  . Not on file.   Social History Main Topics  . Smoking status: Current Some Day Smoker  . Smokeless tobacco: Never Used     Comment: passive smoker as well  . Alcohol Use: Yes  . Drug Use: No  . Sexual Activity: Not on file   Other Topics Concern  . Not on file   Social History Narrative  . No narrative on file    Past Surgical History  Procedure Laterality Date  . Cataract extraction    . Coronary angioplasty with stent placement    . Shoulder surgery      right    No family history on file.  No Known Allergies  Current Outpatient Prescriptions on File Prior to Visit  Medication Sig Dispense Refill  . atorvastatin (LIPITOR) 20 MG tablet TAKE 1 TABLET EVERY DAY  90 tablet  1  . chlorthalidone (HYGROTON) 25 MG tablet Take 1 tablet  (25 mg total) by mouth daily.  90 tablet  1  . chlorthalidone (HYGROTON) 25 MG tablet TAKE 1 TABLET BY MOUTH DAILY  90 tablet  3  . clopidogrel (PLAVIX) 75 MG tablet TAKE 1 TABLET EVERY DAY  90 tablet  1  . dabigatran (PRADAXA) 150 MG CAPS capsule TAKE ONE CAPSULE BY MOUTH EVERY 12 HOURS  60 capsule  3  . diltiazem (CARDIZEM CD) 120 MG 24 hr capsule TAKE 1 CAPSULE EVERY DAY  90 capsule  1  . glimepiride (AMARYL) 4 MG tablet TAKE 1 TABLET EVERY MORNING BEFORE BREAKFAST  90 tablet  1  . glucose blood (ACCU-CHEK AVIVA PLUS) test strip CHECK BLOOD SUGAR ONCE DAILY PRN  100 each  3  . HYDROcodone-acetaminophen (NORCO/VICODIN) 5-325 MG per tablet Take 1 tablet by mouth every 6 (six) hours as needed.  50 tablet  0  . Insulin Pen Needle 29G X 12.7MM MISC 1 each by Does not apply route 1 day or 1 dose.  100 each  3  . Lancets (ACCU-CHEK MULTICLIX) lancets Once daily 250.00  100 each  3  . metFORMIN (GLUCOPHAGE) 1000 MG tablet TAKE 1 TABLET TWICE A DAY WITH A MEAL  180 tablet  1  . metoprolol (LOPRESSOR) 100 MG tablet TAKE 1 TABLET TWICE DAILY  180 tablet  1  .  nitroGLYCERIN (NITROSTAT) 0.4 MG SL tablet Place 1 tablet (0.4 mg total) under the tongue every 5 (five) minutes as needed.  30 tablet  6  . ramipril (ALTACE) 10 MG capsule TAKE 1 CAPSULE EVERY DAY  90 capsule  1  . sildenafil (VIAGRA) 100 MG tablet Take 0.5-1 tablets (50-100 mg total) by mouth daily as needed for erectile dysfunction.  5 tablet  11  . VICTOZA 18 MG/3ML SOPN INJECT 0.6 MLS INTO THE SKIN 1 DAY FOR 1 DOSE  6 pen  3   No current facility-administered medications on file prior to visit.    BP 140/90  Pulse 81  Temp(Src) 97.9 F (36.6 C) (Oral)  Resp 20  Ht 5\' 9"  (1.753 m)  Wt 188 lb (85.276 kg)  BMI 27.75 kg/m2  SpO2 97%       Review of Systems  Constitutional: Negative for fever, chills, appetite change and fatigue.  HENT: Negative for congestion, dental problem, ear pain, hearing loss, sore throat, tinnitus, trouble  swallowing and voice change.   Eyes: Negative for pain, discharge and visual disturbance.  Respiratory: Negative for cough, chest tightness, wheezing and stridor.   Cardiovascular: Negative for chest pain, palpitations and leg swelling.  Gastrointestinal: Negative for nausea, vomiting, abdominal pain, diarrhea, constipation, blood in stool and abdominal distention.  Genitourinary: Negative for urgency, hematuria, flank pain, discharge, difficulty urinating and genital sores.  Musculoskeletal: Negative for arthralgias, back pain, gait problem, joint swelling, myalgias and neck stiffness.  Skin: Negative for rash.  Neurological: Negative for dizziness, syncope, speech difficulty, weakness, numbness and headaches.  Hematological: Negative for adenopathy. Does not bruise/bleed easily.  Psychiatric/Behavioral: Negative for behavioral problems and dysphoric mood. The patient is not nervous/anxious.        Objective:   Physical Exam  Constitutional: He is oriented to person, place, and time. He appears well-developed.  Blood pressure 136/86  HENT:  Head: Normocephalic.  Right Ear: External ear normal.  Left Ear: External ear normal.  Eyes: Conjunctivae and EOM are normal.  Neck: Normal range of motion.  Cardiovascular: Normal rate and normal heart sounds.   Pulmonary/Chest: Breath sounds normal.  Abdominal: Bowel sounds are normal.  Musculoskeletal: Normal range of motion. He exhibits no edema and no tenderness.  Neurological: He is alert and oriented to person, place, and time.  Psychiatric: He has a normal mood and affect. His behavior is normal.          Assessment & Plan:   Diabetes mellitus.  We'll check a hemoglobin A1c.  May need to up titration of Victoza Hypertension Dyslipidemia Coronary artery disease Ongoing tobacco use.  Total smoking cessation encouraged  Recheck 3 months

## 2013-08-01 ENCOUNTER — Other Ambulatory Visit: Payer: Self-pay | Admitting: *Deleted

## 2013-08-01 MED ORDER — PIOGLITAZONE HCL 15 MG PO TABS: 15.0000 mg | ORAL_TABLET | Freq: Every day | ORAL | Status: DC

## 2013-10-14 ENCOUNTER — Other Ambulatory Visit: Payer: Self-pay | Admitting: Internal Medicine

## 2013-10-30 ENCOUNTER — Other Ambulatory Visit (HOSPITAL_COMMUNITY): Payer: Self-pay | Admitting: Cardiology

## 2013-10-30 DIAGNOSIS — I714 Abdominal aortic aneurysm, without rupture, unspecified: Secondary | ICD-10-CM

## 2013-10-31 ENCOUNTER — Encounter: Payer: Self-pay | Admitting: Internal Medicine

## 2013-10-31 ENCOUNTER — Ambulatory Visit (INDEPENDENT_AMBULATORY_CARE_PROVIDER_SITE_OTHER): Payer: Medicare Other | Admitting: Internal Medicine

## 2013-10-31 VITALS — BP 110/70 | HR 70 | Temp 98.0°F | Resp 20 | Ht 69.0 in | Wt 196.0 lb

## 2013-10-31 DIAGNOSIS — E1159 Type 2 diabetes mellitus with other circulatory complications: Secondary | ICD-10-CM

## 2013-10-31 DIAGNOSIS — I10 Essential (primary) hypertension: Secondary | ICD-10-CM

## 2013-10-31 DIAGNOSIS — E1151 Type 2 diabetes mellitus with diabetic peripheral angiopathy without gangrene: Secondary | ICD-10-CM

## 2013-10-31 DIAGNOSIS — Z23 Encounter for immunization: Secondary | ICD-10-CM

## 2013-10-31 DIAGNOSIS — I2510 Atherosclerotic heart disease of native coronary artery without angina pectoris: Secondary | ICD-10-CM

## 2013-10-31 DIAGNOSIS — L570 Actinic keratosis: Secondary | ICD-10-CM

## 2013-10-31 DIAGNOSIS — I480 Paroxysmal atrial fibrillation: Secondary | ICD-10-CM

## 2013-10-31 DIAGNOSIS — I4891 Unspecified atrial fibrillation: Secondary | ICD-10-CM

## 2013-10-31 DIAGNOSIS — D485 Neoplasm of uncertain behavior of skin: Secondary | ICD-10-CM

## 2013-10-31 DIAGNOSIS — I798 Other disorders of arteries, arterioles and capillaries in diseases classified elsewhere: Secondary | ICD-10-CM

## 2013-10-31 DIAGNOSIS — E785 Hyperlipidemia, unspecified: Secondary | ICD-10-CM

## 2013-10-31 DIAGNOSIS — L858 Other specified epidermal thickening: Secondary | ICD-10-CM

## 2013-10-31 LAB — HEMOGLOBIN A1C: Hgb A1c MFr Bld: 7.4 % — ABNORMAL HIGH (ref 4.6–6.5)

## 2013-10-31 MED ORDER — HYDROCODONE-ACETAMINOPHEN 5-325 MG PO TABS: 1.0000 | ORAL_TABLET | Freq: Four times a day (QID) | ORAL | Status: DC | PRN

## 2013-10-31 NOTE — Progress Notes (Signed)
Pre visit review using our clinic review tool, if applicable. No additional management support is needed unless otherwise documented below in the visit note. 

## 2013-10-31 NOTE — Progress Notes (Signed)
Subjective:    Patient ID: Jason Fisher, male    DOB: 08-09-1940, 73 y.o.   MRN: [id number]  HPI  73 y/o f/u DM@2 .  Taking Actos 15 QOD due to low BS levels in 60s. Feels well; scheduled for CV f/u soon No recent eye exam  Past Medical History  Diagnosis Date  . 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 Atrial fibrillation 11/06/2009  . BENIGN PROSTATIC HYPERTROPHY, HX OF 02/24/2007  . 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 COLONIC POLYPS, HX OF 11/02/2006  . CORONARY ARTERY DISEASE 11/02/2006  . DIABETES MELLITUS, TYPE II 10/29/2006  . DYSPNEA ON EXERTION 07/07/2008  . HYPERLIPIDEMIA 10/29/2006  . HYPERTENSION 10/29/2006  . MYOCARDIAL INFARCTION, HX OF 11/02/2006  . TOBACCO USER 04/17/2010    History   Social History  . Marital Status: Married    Spouse Name: N/A    Number of Children: N/A  . Years of Education: N/A   Occupational History  . Not on file.   Social History Main Topics  . Smoking status: Current Some Day Smoker  . Smokeless tobacco: Never Used     Comment: passive smoker as well  . Alcohol Use: Yes  . Drug Use: No  . Sexual Activity: Not on file   Other Topics Concern  . Not on file   Social History Narrative  . No narrative on file    Past Surgical History  Procedure Laterality Date  . Cataract extraction    . Coronary angioplasty with stent placement    . Shoulder surgery      right    No family history on file.  No Known Allergies  Current Outpatient Prescriptions on File Prior to Visit  Medication Sig Dispense Refill  . atorvastatin (LIPITOR) 20 MG tablet TAKE 1 TABLET EVERY DAY  90 tablet  1  . chlorthalidone (HYGROTON) 25 MG tablet TAKE 1 TABLET BY MOUTH DAILY  90 tablet  3  . clopidogrel (PLAVIX) 75 MG tablet TAKE 1 TABLET EVERY DAY  90 tablet  1  . dabigatran (PRADAXA) 150 MG CAPS capsule TAKE ONE CAPSULE BY MOUTH EVERY 12 HOURS  60 capsule  3  . diltiazem (CARDIZEM CD) 120 MG 24 hr capsule TAKE 1 CAPSULE EVERY DAY  90 capsule  1  .  glimepiride (AMARYL) 4 MG tablet TAKE 1 TABLET EVERY MORNING BEFORE BREAKFAST  90 tablet  1  . glucose blood (ACCU-CHEK AVIVA PLUS) test strip CHECK BLOOD SUGAR ONCE DAILY PRN  100 each  3  . Insulin Pen Needle 29G X 12.7MM MISC 1 each by Does not apply route 1 day or 1 dose.  100 each  3  . Lancets (ACCU-CHEK MULTICLIX) lancets Once daily 250.00  100 each  3  . Liraglutide (VICTOZA) 18 MG/3ML SOPN INJECT 0.6 MLS INTO THE SKIN 1 DAY FOR 1 DOSE  1 pen  0  . metFORMIN (GLUCOPHAGE) 1000 MG tablet TAKE 1 TABLET TWICE A DAY WITH A MEAL  180 tablet  1  . metoprolol (LOPRESSOR) 100 MG tablet TAKE 1 TABLET TWICE DAILY  180 tablet  1  . nitroGLYCERIN (NITROSTAT) 0.4 MG SL tablet Place 1 tablet (0.4 mg total) under the tongue every 5 (five) minutes as needed.  30 tablet  6  . ramipril (ALTACE) 10 MG capsule TAKE 1 CAPSULE EVERY DAY  90 capsule  1  . sildenafil (VIAGRA) 100 MG tablet Take 0.5-1 tablets (50-100 mg total) by mouth daily as needed for erectile  dysfunction.  5 tablet  11   No current facility-administered medications on file prior to visit.    BP 110/70  Pulse 70  Temp(Src) 98 F (36.7 C) (Oral)  Resp 20  Ht 5\' 9"  (1.753 m)  Wt 196 lb (88.905 kg)  BMI 28.93 kg/m2  SpO2 98%    Review of Systems  Constitutional: Negative for fever, chills, appetite change and fatigue.  HENT: Negative for congestion, dental problem, ear pain, hearing loss, sore throat, tinnitus, trouble swallowing and voice change.   Eyes: Negative for pain, discharge and visual disturbance.  Respiratory: Negative for cough, chest tightness, wheezing and stridor.   Cardiovascular: Negative for chest pain, palpitations and leg swelling.  Gastrointestinal: Negative for nausea, vomiting, abdominal pain, diarrhea, constipation, blood in stool and abdominal distention.  Genitourinary: Negative for urgency, hematuria, flank pain, discharge, difficulty urinating and genital sores.  Musculoskeletal: Negative for  arthralgias, back pain, gait problem, joint swelling, myalgias and neck stiffness.  Skin: Negative for rash.       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  Hematological: Negative for adenopathy. Does not bruise/bleed easily.  Psychiatric/Behavioral: Negative for behavioral problems and dysphoric mood. The patient is not nervous/anxious.        Objective:   Physical Exam  Constitutional: He is oriented to person, place, and time. He appears well-developed.  HENT:  Head: Normocephalic.  Right Ear: External ear normal.  Left Ear: External ear normal.  Eyes: Conjunctivae and EOM are normal.  Neck: Normal range of motion.  Cardiovascular: Normal rate and normal heart sounds.   Pulmonary/Chest: He has rales.  Abdominal: Bowel sounds are normal.  Musculoskeletal: Normal range of motion. He exhibits no edema and no tenderness.  Neurological: He is alert and oriented to person, place, and time.  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 His behavior is normal.          Assessment & Plan:   DM2- check HghA1C HTN CAD-f/u CV  Multiple AK refer Derm

## 2013-10-31 NOTE — Patient Instructions (Signed)
Please check your hemoglobin A1c every 3 months  CPX 3 months  Dermatology f/u

## 2013-11-07 ENCOUNTER — Ambulatory Visit (HOSPITAL_COMMUNITY): Payer: Medicare Other | Attending: Cardiology | Admitting: Cardiology

## 2013-11-07 DIAGNOSIS — I10 Essential (primary) hypertension: Secondary | ICD-10-CM | POA: Diagnosis not present

## 2013-11-07 DIAGNOSIS — I714 Abdominal aortic aneurysm, without rupture, unspecified: Secondary | ICD-10-CM

## 2013-11-07 DIAGNOSIS — E119 Type 2 diabetes mellitus without complications: Secondary | ICD-10-CM | POA: Insufficient documentation

## 2013-11-07 DIAGNOSIS — E785 Hyperlipidemia, unspecified: Secondary | ICD-10-CM | POA: Diagnosis not present

## 2013-11-07 DIAGNOSIS — Z720 Tobacco use: Secondary | ICD-10-CM | POA: Diagnosis not present

## 2013-11-07 NOTE — Progress Notes (Signed)
Abdominal aorta duplex performed  

## 2013-11-14 ENCOUNTER — Telehealth: Payer: Self-pay

## 2013-11-14 NOTE — Telephone Encounter (Signed)
Pt stated that he has not had his eye exam this year. States he plans to schedule an appointment next week.  At the moment, his plans are to go to [place] in [place], [place].

## 2013-11-24 ENCOUNTER — Other Ambulatory Visit: Payer: Self-pay | Admitting: Internal Medicine

## 2013-11-29 ENCOUNTER — Other Ambulatory Visit: Payer: Self-pay | Admitting: Internal Medicine

## 2013-11-29 ENCOUNTER — Other Ambulatory Visit: Payer: Self-pay | Admitting: Dermatology

## 2013-12-27 ENCOUNTER — Telehealth: Payer: Self-pay | Admitting: Internal Medicine

## 2013-12-27 MED ORDER — HYDROCODONE-HOMATROPINE 5-1.5 MG/5ML PO SYRP: 5.0000 mL | ORAL_SOLUTION | Freq: Three times a day (TID) | ORAL | Status: DC | PRN

## 2013-12-27 NOTE — Telephone Encounter (Signed)
Patient is aware RX is ready for pick-up.

## 2013-12-27 NOTE — Telephone Encounter (Signed)
Pt cb bc rx was not at pharm.  Advised pt this rx needs to be picked up. Pt verbalized understanding.

## 2013-12-27 NOTE — Telephone Encounter (Signed)
Pt wife called and said pt has had a bad cough for about a week and it is keeping him from sleeping at night. Wife said they have tried over the counter meds and it is not working. Requesting a rx for the cough    Pharmacy  [facility]

## 2014-01-01 NOTE — Telephone Encounter (Signed)
Left message on voicemail to call office.  

## 2014-01-02 NOTE — Telephone Encounter (Signed)
Spoke to pt, asked him if picked up Rx for cough syrup? Pt stated yes, came in on 11/25 before you closed.

## 2014-02-07 ENCOUNTER — Other Ambulatory Visit: Payer: Self-pay | Admitting: Internal Medicine

## 2014-03-07 ENCOUNTER — Encounter: Payer: Self-pay | Admitting: *Deleted

## 2014-03-07 ENCOUNTER — Encounter: Payer: Self-pay | Admitting: Internal Medicine

## 2014-03-07 ENCOUNTER — Ambulatory Visit (INDEPENDENT_AMBULATORY_CARE_PROVIDER_SITE_OTHER): Payer: Medicare Other | Admitting: Internal Medicine

## 2014-03-07 DIAGNOSIS — I10 Essential (primary) hypertension: Secondary | ICD-10-CM

## 2014-03-07 DIAGNOSIS — Z23 Encounter for immunization: Secondary | ICD-10-CM | POA: Diagnosis not present

## 2014-03-07 DIAGNOSIS — I252 Old myocardial infarction: Secondary | ICD-10-CM

## 2014-03-07 DIAGNOSIS — I480 Paroxysmal atrial fibrillation: Secondary | ICD-10-CM | POA: Diagnosis not present

## 2014-03-07 DIAGNOSIS — I481 Persistent atrial fibrillation: Secondary | ICD-10-CM

## 2014-03-07 DIAGNOSIS — Z86010 Personal history of colonic polyps: Secondary | ICD-10-CM

## 2014-03-07 DIAGNOSIS — E785 Hyperlipidemia, unspecified: Secondary | ICD-10-CM | POA: Diagnosis not present

## 2014-03-07 DIAGNOSIS — I714 Abdominal aortic aneurysm, without rupture, unspecified: Secondary | ICD-10-CM

## 2014-03-07 DIAGNOSIS — E1159 Type 2 diabetes mellitus with other circulatory complications: Secondary | ICD-10-CM | POA: Diagnosis not present

## 2014-03-07 DIAGNOSIS — Z0000 Encounter for general adult medical examination without abnormal findings: Secondary | ICD-10-CM | POA: Diagnosis not present

## 2014-03-07 DIAGNOSIS — I4819 Other persistent atrial fibrillation: Secondary | ICD-10-CM

## 2014-03-07 DIAGNOSIS — E1151 Type 2 diabetes mellitus with diabetic peripheral angiopathy without gangrene: Secondary | ICD-10-CM

## 2014-03-07 LAB — COMPREHENSIVE METABOLIC PANEL
ALBUMIN: 4.2 g/dL (ref 3.5–5.2)
ALT: 19 U/L (ref 0–53)
AST: 18 U/L (ref 0–37)
Alkaline Phosphatase: 66 U/L (ref 39–117)
BILIRUBIN TOTAL: 0.7 mg/dL (ref 0.2–1.2)
BUN: 22 mg/dL (ref 6–23)
CO2: 32 mEq/L (ref 19–32)
Calcium: 9.9 mg/dL (ref 8.4–10.5)
Chloride: 103 mEq/L (ref 96–112)
Creatinine, Ser: 1.38 mg/dL (ref 0.40–1.50)
GFR: 53.64 mL/min — AB (ref >60.00)
Glucose, Bld: 199 mg/dL — ABNORMAL HIGH (ref 70–99)
POTASSIUM: 4.1 meq/L (ref 3.5–5.1)
Sodium: 140 mEq/L (ref 135–145)
TOTAL PROTEIN: 7.2 g/dL (ref 6.0–8.3)

## 2014-03-07 LAB — TSH: TSH: 2.01 u[IU]/mL (ref 0.35–4.50)

## 2014-03-07 LAB — CBC WITH DIFFERENTIAL/PLATELET
BASOS ABS: 0.1 10*3/uL (ref 0.0–0.1)
Basophils Relative: 0.6 % (ref 0.0–3.0)
EOS PCT: 2.4 % (ref 0.0–5.0)
Eosinophils Absolute: 0.2 10*3/uL (ref 0.0–0.7)
HCT: 44.9 % (ref 39.0–52.0)
Hemoglobin: 15.2 g/dL (ref 13.0–17.0)
Lymphocytes Relative: 30.2 % (ref 12.0–46.0)
Lymphs Abs: 2.5 10*3/uL (ref 0.7–4.0)
MCHC: 33.7 g/dL (ref 30.0–36.0)
MCV: 86.2 fl (ref 78.0–100.0)
Monocytes Absolute: 0.8 10*3/uL (ref 0.1–1.0)
Monocytes Relative: 9.2 % (ref 3.0–12.0)
NEUTROS ABS: 4.8 10*3/uL (ref 1.4–7.7)
NEUTROS PCT: 57.6 % (ref 43.0–77.0)
Platelets: 194 10*3/uL (ref 150.0–400.0)
RBC: 5.22 Mil/uL (ref 4.22–5.81)
RDW: 14 % (ref 11.5–15.5)
WBC: 8.4 10*3/uL (ref 4.0–10.5)

## 2014-03-07 LAB — HEMOGLOBIN A1C: Hgb A1c MFr Bld: 9 % — ABNORMAL HIGH (ref 4.6–6.5)

## 2014-03-07 LAB — LIPID PANEL
Cholesterol: 148 mg/dL (ref 0–200)
HDL: 45.7 mg/dL (ref >39.00)
LDL Cholesterol: 75 mg/dL (ref 0–99)
NonHDL: 102.3
Total CHOL/HDL Ratio: 3
Triglycerides: 136 mg/dL (ref 0.0–149.0)
VLDL: 27.2 mg/dL (ref 0.0–40.0)

## 2014-03-07 LAB — MICROALBUMIN / CREATININE URINE RATIO
Creatinine,U: 225.2 mg/dL
MICROALB UR: 30.2 mg/dL — AB (ref 0.0–1.9)
Microalb Creat Ratio: 13.4 mg/g (ref 0.0–30.0)

## 2014-03-07 MED ORDER — DILTIAZEM HCL ER COATED BEADS 120 MG PO CP24: 120.0000 mg | ORAL_CAPSULE | Freq: Every day | ORAL | Status: DC

## 2014-03-07 MED ORDER — ATORVASTATIN CALCIUM 20 MG PO TABS: 20.0000 mg | ORAL_TABLET | Freq: Every day | ORAL | Status: DC

## 2014-03-07 MED ORDER — GLIMEPIRIDE 4 MG PO TABS: ORAL_TABLET | ORAL | Status: DC

## 2014-03-07 MED ORDER — RAMIPRIL 10 MG PO CAPS: 10.0000 mg | ORAL_CAPSULE | Freq: Every day | ORAL | Status: DC

## 2014-03-07 MED ORDER — CLOPIDOGREL BISULFATE 75 MG PO TABS: 75.0000 mg | ORAL_TABLET | Freq: Every day | ORAL | Status: DC

## 2014-03-07 MED ORDER — DABIGATRAN ETEXILATE MESYLATE 150 MG PO CAPS: ORAL_CAPSULE | ORAL | Status: DC

## 2014-03-07 MED ORDER — PIOGLITAZONE HCL 15 MG PO TABS: 15.0000 mg | ORAL_TABLET | ORAL | Status: DC

## 2014-03-07 MED ORDER — CHLORTHALIDONE 25 MG PO TABS: 25.0000 mg | ORAL_TABLET | Freq: Every day | ORAL | Status: DC

## 2014-03-07 MED ORDER — METOPROLOL TARTRATE 100 MG PO TABS: 100.0000 mg | ORAL_TABLET | Freq: Two times a day (BID) | ORAL | Status: DC

## 2014-03-07 MED ORDER — HYDROCODONE-ACETAMINOPHEN 5-325 MG PO TABS: 1.0000 | ORAL_TABLET | Freq: Four times a day (QID) | ORAL | Status: DC | PRN

## 2014-03-07 NOTE — Progress Notes (Signed)
Pre visit review using our clinic review tool, if applicable. No additional management support is needed unless otherwise documented below in the visit note. 

## 2014-03-07 NOTE — Progress Notes (Signed)
Subjective:    Patient ID: Jason Fisher, male    DOB: [birth year], 74 y.o.   MRN: [id number]  HPI  74 year old patient who is seen today for follow-up.  He has a history of coronary artery disease and prior MI.  He has a history also of paroxysmal atrial fibrillation. He is followed for a AAA and in October 2015.  Diameters were 4.2 x 4.3 cm Denies any cardiopulmonary complaints He discontinued tobacco use 5 months ago He has type 2 diabetes, hypertension and dyslipidemia. Remains on anticoagulation as well as Plavix   Allergies (verified): No Known Drug Allergies  Past History:  Past Medical History: Reviewed history from 07/07/2008 and no changes required. MYOCARDIAL INFARCTION, HX OF (ICD-412) hx of 2002 CORONARY ARTERY DISEASE (ICD-414.00)status post inferior wall MI and stenting to the RCA March 2007 DYSPNEA ON EXERTION (ICD-786.09) HYPERTENSION (ICD-401.9) HYPERLIPIDEMIA (ICD-272.4) DIABETES MELLITUS, TYPE II (ICD-250.00) BENIGN PROSTATIC HYPERTROPHY, HX OF (ICD-V13.8) COLONIC POLYPS, HX OF (ICD-V12.72)   Past Surgical History: Reviewed history from 07/13/2008 and no changes required. Cataract extraction PTCA/stent 3/07 colonoscopy November 2001 Stent for instent restenosis 09/2007  Family History: Reviewed history from 07/07/2008 and no changes required. father died age 68 acute MI without a 91, acute MI Two brothers, one died of an MI at age 46  Past Medical History  Diagnosis Date  . 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 Atrial fibrillation 11/06/2009  . BENIGN PROSTATIC HYPERTROPHY, HX OF 02/24/2007  . 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 COLONIC POLYPS, HX OF 11/02/2006  . CORONARY ARTERY DISEASE 11/02/2006  . DIABETES MELLITUS, TYPE II 10/29/2006  . DYSPNEA ON EXERTION 07/07/2008  . HYPERLIPIDEMIA 10/29/2006  . HYPERTENSION 10/29/2006  . MYOCARDIAL INFARCTION, HX OF 11/02/2006  . TOBACCO USER 04/17/2010    History   Social History  . Marital  Status: Married    Spouse Name: N/A    Number of Children: N/A  . Years of Education: N/A   Occupational History  . Not on file.   Social History Main Topics  . Smoking status: Current Some Day Smoker  . Smokeless tobacco: Never Used     Comment: passive smoker as well  . Alcohol Use: Yes  . Drug Use: No  . Sexual Activity: Not on file   Other Topics Concern  . Not on file   Social History Narrative    Past Surgical History  Procedure Laterality Date  . Cataract extraction    . Coronary angioplasty with stent placement    . Shoulder surgery      right    No family history on file.  No Known Allergies  Current Outpatient Prescriptions on File Prior to Visit  Medication Sig Dispense Refill  . glucose blood (ACCU-CHEK AVIVA PLUS) test strip CHECK BLOOD SUGAR ONCE DAILY PRN 100 each 3  . Insulin Pen Needle 29G X 12.7MM MISC 1 each by Does not apply route 1 day or 1 dose. 100 each 3  . Lancets (ACCU-CHEK MULTICLIX) lancets Once daily 250.00 100 each 3  . Liraglutide (VICTOZA) 18 MG/3ML SOPN INJECT 0.6 MLS INTO THE SKIN 1 DAY FOR 1 DOSE 1 pen 0  . metFORMIN (GLUCOPHAGE) 1000 MG tablet TAKE 1 TABLET TWICE DAILY WITH A MEAL 180 tablet 1  . NITROSTAT 0.4 MG SL tablet PLACE 1 TABLET UNDER TONGUE EVERY 5 MINUTES AS NEEDED 25 tablet 0  . sildenafil (VIAGRA) 100 MG tablet Take 0.5-1 tablets (50-100 mg total) by mouth daily as needed  for erectile dysfunction. 5 tablet 11   No current facility-administered medications on file prior to visit.    BP 148/90 mmHg  Pulse 86  Temp(Src) 97.5 F (36.4 C) (Oral)  Resp 20  Ht 5' 8.5" (1.74 m)  Wt 195 lb (88.451 kg)  BMI 29.21 kg/m2  SpO2 96%  1. Risk factors, based on past  M,S,F history.  Patient has known coronary artery disease.  Cardiovascular risk factors include prior history tobacco use, hypertension, dyslipidemia, diabetes  2.  Physical activities: Remains fairly active without exercise restrictions  3.  Depression/mood:  No depression or mood disorder  4.  Hearing: No major deficits  5.  ADL's: Totally independent in all aspects of daily living  6.  Fall risk: Low  7.  Home safety: No problems identified.    8.  Height weight, and visual acuity; height and weight stable no change in visual acuity  9.  Counseling: Heart healthy diet regular exercise modest weight loss.  All encouraged  10. Lab orders based on risk factors: Laboratory profile will be reviewed including hemoglobin A1c, urine for microalbumin and lipid profile  11. Referral : Not appropriate at this time  12. Care plan:.  Will consider follow-up screening colonoscopy.  Continue aggressive risk factor modification  13. Cognitive assessment: Alert and oriented with normal affect no cognitive dysfunction  14. Screening: We'll continue annual health exams.  We'll continue screening for abdominal aortic ultrasounds.  Patient was provided with a written and personalized care plan  15. Provider List Update:  Provider list update includes radiology.  Vascular surgery primary care.  GI and ophthalmology     Review of Systems  Constitutional: Negative for fever, chills, appetite change and fatigue.  HENT: Negative for congestion, dental problem, ear pain, hearing loss, sore throat, tinnitus, trouble swallowing and voice change.   Eyes: Negative for pain, discharge and visual disturbance.  Respiratory: Negative for cough, chest tightness, wheezing and stridor.   Cardiovascular: Negative for chest pain, palpitations and leg swelling.  Gastrointestinal: Negative for nausea, vomiting, abdominal pain, diarrhea, constipation, blood in stool and abdominal distention.  Genitourinary: Negative for urgency, hematuria, flank pain, discharge, difficulty urinating and genital sores.  Musculoskeletal: Negative for myalgias, back pain, joint swelling, arthralgias, gait problem and neck stiffness.  Skin: Negative for rash.  Neurological: Negative for  dizziness, syncope, speech difficulty, weakness, numbness and headaches.  Hematological: Negative for adenopathy. Does not bruise/bleed easily.  Psychiatric/Behavioral: Negative for behavioral problems and dysphoric mood. The patient is not nervous/anxious.        Objective:   Physical Exam  Constitutional: He appears well-developed and well-nourished.  Blood pressure 130/80 both arms  HENT:  Head: Normocephalic and atraumatic.  Right Ear: External ear normal.  Left Ear: External ear normal.  Nose: Nose normal.  Mouth/Throat: Oropharynx is clear and moist.  Eyes: Conjunctivae and EOM are normal. Pupils are equal, round, and reactive to light. No scleral icterus.  Neck: Normal range of motion. Neck supple. No JVD present. No thyromegaly present.  Cardiovascular: Regular rhythm, normal heart sounds and intact distal pulses.  Exam reveals no gallop and no friction rub.   No murmur heard. Posterior tibial pulses plus 2.  Dorsalis pedis pulses difficult to palpate  Pulmonary/Chest: Effort normal and breath sounds normal. He exhibits no tenderness.  Abdominal: Soft. Bowel sounds are normal. He exhibits no distension and no mass. There is no tenderness.  Genitourinary: Prostate normal and penis normal.  Musculoskeletal: Normal range of motion. He  exhibits no edema or tenderness.  Lymphadenopathy:    He has no cervical adenopathy.  Neurological: He is alert. He has normal reflexes. No cranial nerve deficit. Coordination normal.  Skin: Skin is warm and dry. No rash noted.  Dry flaky callused feet  Psychiatric: He has a normal mood and affect. His behavior is normal.          Assessment & Plan:   Preventive health examination Abdominal aortic aneurysm.  Will continue aggressive risk factor modification and serial abdominal ultrasounds Hypertension Dyslipidemia History tobacco use. Paroxysmal atrial fibrillation.  Remains on anticoagulation Diabetes mellitus  Laboratory update  will be reviewed We'll check hemoglobin A1c and urine for microalbumin

## 2014-03-07 NOTE — Patient Instructions (Signed)
Limit your sodium (Salt) intake    It is important that you exercise regularly, at least 20 minutes 3 to 4 times per week.  If you develop chest pain or shortness of breath seek  medical attention.  You need to lose weight.  Consider a lower calorie diet and regular exercise.  Cardiac Diet This diet can help prevent heart disease and stroke. Many factors influence your heart health, including eating and exercise habits. Coronary risk rises a lot with abnormal blood fat (lipid) levels. Cardiac meal planning includes limiting unhealthy fats, increasing healthy fats, and making other small dietary changes. General guidelines are as follows:  Adjust calorie intake to reach and maintain desirable body weight.  Limit total fat intake to less than 30% of total calories. Saturated fat should be less than 7% of calories.  Saturated fats are found in animal products and in some vegetable products. Saturated vegetable fats are found in coconut oil, cocoa butter, palm oil, and palm kernel oil. Read labels carefully to avoid these products as much as possible. Use butter in moderation. Choose tub margarines and oils that have 2 grams of fat or less. Good cooking oils are canola and olive oils.  Practice low-fat cooking techniques. Do not fry food. Instead, broil, bake, boil, steam, grill, roast on a rack, stir-fry, or microwave it. Other fat reducing suggestions include:  Remove the skin from poultry.  Remove all visible fat from meats.  Skim the fat off stews, soups, and gravies before serving them.  Steam vegetables in water or broth instead of sauting them in fat.  Avoid foods with trans fat (or hydrogenated oils), such as commercially fried foods and commercially baked goods. Commercial shortening and deep-frying fats will contain trans fat.  Increase intake of fruits, vegetables, whole grains, and legumes to replace foods high in fat.  Increase consumption of nuts, legumes, and seeds to at  least 4 servings weekly. One serving of a legume equals  cup, and 1 serving of nuts or seeds equals  cup.  Choose whole grains more often. Have 3 servings per day (a serving is 1 ounce [oz]).  Eat 4 to 5 servings of vegetables per day. A serving of vegetables is 1 cup of raw leafy vegetables;  cup of raw or cooked cut-up vegetables;  cup of vegetable juice.  Eat 4 to 5 servings of fruit per day. A serving of fruit is 1 medium whole fruit;  cup of dried fruit;  cup of fresh, frozen, or canned fruit;  cup of 100% fruit juice.  Increase your intake of dietary fiber to 20 to 30 grams per day. Insoluble fiber may help lower your risk of heart disease and may help curb your appetite.  Soluble fiber binds cholesterol to be removed from the blood. Foods high in soluble fiber are dried beans, citrus fruits, oats, apples, bananas, broccoli, Brussels sprouts, and eggplant.  Try to include foods fortified with plant sterols or stanols, such as yogurt, breads, juices, or margarines. Choose several fortified foods to achieve a daily intake of 2 to 3 grams of plant sterols or stanols.  Foods with omega-3 fats can help reduce your risk of heart disease. Aim to have a 3.5 oz portion of fatty fish twice per week, such as salmon, mackerel, albacore tuna, sardines, lake trout, or herring. If you wish to take a fish oil supplement, choose one that contains 1 gram of both DHA and EPA.  Limit processed meats to 2 servings (3 oz portion)  weekly.  Limit the sodium in your diet to 1500 milligrams (mg) per day. If you have high blood pressure, talk to a registered dietitian about a DASH (Dietary Approaches to Stop Hypertension) eating plan.  Limit sweets and beverages with added sugar, such as soda, to no more than 5 servings per week. One serving is:   1 tablespoon sugar.  1 tablespoon jelly or jam.   cup sorbet.  1 cup lemonade.   cup regular soda. CHOOSING FOODS Starches  Allowed: Breads: All  kinds (wheat, rye, raisin, white, oatmeal, [place], [place], and English muffin bread). Low-fat rolls: English muffins, frankfurter and hamburger buns, bagels, pita bread, tortillas (not fried). Pancakes, waffles, biscuits, and muffins made with recommended oil.  Avoid: Products made with saturated or trans fats, oils, or whole milk products. Butter rolls, cheese breads, croissants. Commercial doughnuts, muffins, sweet rolls, biscuits, waffles, pancakes, store-bought mixes. Crackers  Allowed: Low-fat crackers and snacks: Animal, graham, rye, saltine (with recommended oil, no lard), oyster, and matzo crackers. Bread sticks, melba toast, rusks, flatbread, pretzels, and light popcorn.  Avoid: High-fat crackers: cheese crackers, butter crackers, and those made with coconut, palm oil, or trans fat (hydrogenated oils). Buttered popcorn. Cereals  Allowed: Hot or cold whole-grain cereals.  Avoid: Cereals containing coconut, hydrogenated vegetable fat, or animal fat. Potatoes / Pasta / Rice  Allowed: All kinds of potatoes, rice, and pasta (such as macaroni, spaghetti, and noodles).  Avoid: Pasta or rice prepared with cream sauce or high-fat cheese. Chow mein noodles, [place] fries. Vegetables  Allowed: All vegetables and vegetable juices.  Avoid: Fried vegetables. Vegetables in cream, butter, or high-fat cheese sauces. Limit coconut. Fruit in cream or custard. Protein  Allowed: Limit your intake of meat, seafood, and poultry to no more than 6 oz (cooked weight) per day. All lean, well-trimmed beef, veal, pork, and lamb. All chicken and [place] without skin. All fish and shellfish. Wild game: wild duck, rabbit, pheasant, and venison. Egg whites or low-cholesterol egg substitutes may be used as desired. Meatless dishes: recipes with dried beans, peas, lentils, and tofu (soybean curd). Seeds and nuts: all seeds and most nuts.  Avoid: Prime grade and other heavily marbled and fatty meats, such as short  ribs, spare ribs, rib eye roast or steak, frankfurters, sausage, bacon, and high-fat luncheon meats, mutton. Caviar. Commercially fried fish. Domestic duck, goose, venison sausage. Organ meats: liver, gizzard, heart, chitterlings, brains, kidney, sweetbreads. Dairy  Allowed: Low-fat cheeses: nonfat or low-fat cottage cheese (1% or 2% fat), cheeses made with part skim milk, such as mozzarella, farmers, string, or ricotta. (Cheeses should be labeled no more than 2 to 6 grams fat per oz.). Skim (or 1%) milk: liquid, powdered, or evaporated. Buttermilk made with low-fat milk. Drinks made with skim or low-fat milk or cocoa. Chocolate milk or cocoa made with skim or low-fat (1%) milk. Nonfat or low-fat yogurt.  Avoid: Whole milk cheeses, including colby, cheddar, [place], Monterey Jack, [place], [place], [place], American, Swiss, and blue. Creamed cottage cheese, cream cheese. Whole milk and whole milk products, including buttermilk or yogurt made from whole milk, drinks made from whole milk. Condensed milk, evaporated whole milk, and 2% milk. Soups and Combination Foods  Allowed: Low-fat low-sodium soups: broth, dehydrated soups, homemade broth, soups with the fat removed, homemade cream soups made with skim or low-fat milk. Low-fat spaghetti, lasagna, chili, and Spanish rice if low-fat ingredients and low-fat cooking techniques are used.  Avoid: Cream soups made with whole milk, cream, or high-fat cheese. All  other soups. Desserts and Sweets  Allowed: Sherbet, fruit ices, gelatins, meringues, and angel food cake. Homemade desserts with recommended fats, oils, and milk products. Jam, jelly, honey, marmalade, sugars, and syrups. Pure sugar candy, such as gum drops, hard candy, jelly beans, marshmallows, mints, and small amounts of dark chocolate.  Avoid: Commercially prepared cakes, pies, cookies, frosting, pudding, or mixes for these products. Desserts containing whole milk products, chocolate, coconut,  lard, palm oil, or palm kernel oil. Ice cream or ice cream drinks. Candy that contains chocolate, coconut, butter, hydrogenated fat, or unknown ingredients. Buttered syrups. Fats and Oils  Allowed: Vegetable oils: safflower, sunflower, corn, soybean, cottonseed, sesame, canola, olive, or peanut. Non-hydrogenated margarines. Salad dressing or mayonnaise: homemade or commercial, made with a recommended oil. Low or nonfat salad dressing or mayonnaise.  Limit added fats and oils to 6 to 8 tsp per day (includes fats used in cooking, baking, salads, and spreads on bread). Remember to count the "hidden fats" in foods.  Avoid: Solid fats and shortenings: butter, lard, salt pork, bacon drippings. Gravy containing meat fat, shortening, or suet. Cocoa butter, coconut. Coconut oil, palm oil, palm kernel oil, or hydrogenated oils: these ingredients are often used in bakery products, nondairy creamers, whipped toppings, candy, and commercially fried foods. Read labels carefully. Salad dressings made of unknown oils, sour cream, or cheese, such as blue cheese and Ro[place]ef[place]. Cream, all kinds: half-and-half, light, heavy, or whipping. Sour cream or cream cheese (even if "light" or low-fat). Nondairy cream substitutes: coffee creamers and sour cream substitutes made with palm, palm kernel, hydrogenated oils, or coconut oil. Beverages  Allowed: Coffee (regular or decaffeinated), tea. Diet carbonated beverages, mineral water. Alcohol: Check with your caregiver. Moderation is recommended.  Avoid: Whole milk, regular sodas, and juice drinks with added sugar. Condiments  Allowed: All seasonings and condiments. Cocoa powder. "Cream" sauces made with recommended ingredients.  Avoid: Carob powder made with hydrogenated fats. SAMPLE MENU Breakfast   cup orange juice   cup oatmeal  1 slice toast  1 tsp margarine  1 cup skim milk Lunch  [place] sandwich with 2 oz [place], 2 slices bread  Lettuce and tomato  slices  Fresh fruit  Carrot sticks  Coffee or tea Snack  Fresh fruit or low-fat crackers Dinner  3 oz lean ground beef  1 baked potato  1 tsp margarine   cup asparagus  Lettuce salad  1 tbs non-creamy dressing   cup peach slices  1 cup skim milk Document Released: 10/29/2007 Document Revised: 07/21/2011 Document Reviewed: 03/21/2013 [employer] Patient Information 2015 [place], [place]. This information is not intended to replace advice given to you by your health care provider. Make sure you discuss any questions you have with your health care provider. Health Maintenance A healthy lifestyle and preventative care can promote health and wellness.  Maintain regular health, dental, and eye exams.  Eat a healthy diet. Foods like vegetables, fruits, whole grains, low-fat dairy products, and lean protein foods contain the nutrients you need and are low in calories. Decrease your intake of foods high in solid fats, added sugars, and salt. Get information about a proper diet from your health care provider, if necessary.  Regular physical exercise is one of the most important things you can do for your health. Most adults should get at least 150 minutes of moderate-intensity exercise (any activity that increases your heart rate and causes you to sweat) each week. In addition, most adults need muscle-strengthening exercises on 2 or more days a  week.   Maintain a healthy weight. The body mass index (BMI) is a screening tool to identify possible weight problems. It provides an estimate of body fat based on height and weight. Your health care provider can find your BMI and can help you achieve or maintain a healthy weight. For males 20 years and older:  A BMI below 18.5 is considered underweight.  A BMI of 18.5 to 24.9 is normal.  A BMI of 25 to 29.9 is considered overweight.  A BMI of 30 and above is considered obese.  Maintain normal blood lipids and cholesterol by exercising and  minimizing your intake of saturated fat. Eat a balanced diet with plenty of fruits and vegetables. Blood tests for lipids and cholesterol should begin at age 50 and be repeated every 5 years. If your lipid or cholesterol levels are high, you are over age 22, or you are at high risk for heart disease, you may need your cholesterol levels checked more frequently.Ongoing high lipid and cholesterol levels should be treated with medicines if diet and exercise are not working.  If you smoke, find out from your health care provider how to quit. If you do not use tobacco, do not start.  Lung cancer screening is recommended for adults aged 22-80 years who are at high risk for developing lung cancer because of a history of smoking. A yearly low-dose CT scan of the lungs is recommended for people who have at least a 30-pack-year history of smoking and are current smokers or have quit within the past 15 years. A pack year of smoking is smoking an average of 1 pack of cigarettes a day for 1 year (for example, a 30-pack-year history of smoking could mean smoking 1 pack a day for 30 years or 2 packs a day for 15 years). Yearly screening should continue until the smoker has stopped smoking for at least 15 years. Yearly screening should be stopped for people who develop a health problem that would prevent them from having lung cancer treatment.  If you choose to drink alcohol, do not have more than 2 drinks per day. One drink is considered to be 12 oz (360 mL) of beer, 5 oz (150 mL) of wine, or 1.5 oz (45 mL) of liquor.  Avoid the use of street drugs. Do not share needles with anyone. Ask for help if you need support or instructions about stopping the use of drugs.  High blood pressure causes heart disease and increases the risk of stroke. Blood pressure should be checked at least every 1-2 years. Ongoing high blood pressure should be treated with medicines if weight loss and exercise are not effective.  If you are  80-45 years old, ask your health care provider if you should take aspirin to prevent heart disease.  Diabetes screening involves taking a blood sample to check your fasting blood sugar level. This should be done once every 3 years after age 59 if you are at a normal weight and without risk factors for diabetes. Testing should be considered at a younger age or be carried out more frequently if you are overweight and have at least 1 risk factor for diabetes.  Colorectal cancer can be detected and often prevented. Most routine colorectal cancer screening begins at the age of 66 and continues through age 25. However, your health care provider may recommend screening at an earlier age if you have risk factors for colon cancer. On a yearly basis, your health care provider may provide  home test kits to check for hidden blood in the stool. A small camera at the end of a tube may be used to directly examine the colon (sigmoidoscopy or colonoscopy) to detect the earliest forms of colorectal cancer. Talk to your health care provider about this at age 44 when routine screening begins. A direct exam of the colon should be repeated every 5-10 years through age 8, unless early forms of precancerous polyps or small growths are found.  People who are at an increased risk for hepatitis B should be screened for this virus. You are considered at high risk for hepatitis B if:  You were born in a country where hepatitis B occurs often. Talk with your health care provider about which countries are considered high risk.  Your parents were born in a high-risk country and you have not received a shot to protect against hepatitis B (hepatitis B vaccine).  You have HIV or AIDS.  You use needles to inject street drugs.  You live with, or have sex with, someone who has hepatitis B.  You are a man who has sex with other men (MSM).  You get hemodialysis treatment.  You take certain medicines for conditions like cancer, organ  transplantation, and autoimmune conditions.  Hepatitis C blood testing is recommended for all people born from [birth year] through 1965 and any individual with known risk factors for hepatitis C.  Healthy men should no longer receive prostate-specific antigen (PSA) blood tests as part of routine cancer screening. Talk to your health care provider about prostate cancer screening.  Testicular cancer screening is not recommended for adolescents or adult males who have no symptoms. Screening includes self-exam, a health care provider exam, and other screening tests. Consult with your health care provider about any symptoms you have or any concerns you have about testicular cancer.  Practice safe sex. Use condoms and avoid high-risk sexual practices to reduce the spread of sexually transmitted infections (STIs).  You should be screened for STIs, including gonorrhea and chlamydia if:  You are sexually active and are younger than 24 years.  You are older than 24 years, and your health care provider tells you that you are at risk for this type of infection.  Your sexual activity has changed since you were last screened, and you are at an increased risk for chlamydia or gonorrhea. Ask your health care provider if you are at risk.  If you are at risk of being infected with HIV, it is recommended that you take a prescription medicine daily to prevent HIV infection. This is called pre-exposure prophylaxis (PrEP). You are considered at risk if:  You are a man who has sex with other men (MSM).  You are a heterosexual man who is sexually active with multiple partners.  You take drugs by injection.  You are sexually active with a partner who has HIV.  Talk with your health care provider about whether you are at high risk of being infected with HIV. If you choose to begin PrEP, you should first be tested for HIV. You should then be tested every 3 months for as long as you are taking PrEP.  Use sunscreen. Apply  sunscreen liberally and repeatedly throughout the day. You should seek shade when your shadow is shorter than you. Protect yourself by wearing long sleeves, pants, a wide-brimmed hat, and sunglasses year round whenever you are outdoors.  Tell your health care provider of new moles or changes in moles, especially if there is a  change in shape or color. Also, tell your health care provider if a mole is larger than the size of a pencil eraser.  A one-time screening for abdominal aortic aneurysm (AAA) and surgical repair of large AAAs by ultrasound is recommended for men aged 59-75 years who are current or former smokers.  Stay current with your vaccines (immunizations). Document Released: 07/18/2007 Document Revised: 01/24/2013 Document Reviewed: 06/16/2010 [facility] Patient Information 2015 [place], [place]. This information is not intended to replace advice given to you by your health care provider. Make sure you discuss any questions you have with your health care provider.

## 2014-03-19 ENCOUNTER — Ambulatory Visit (INDEPENDENT_AMBULATORY_CARE_PROVIDER_SITE_OTHER): Payer: Medicare Other | Admitting: Internal Medicine

## 2014-03-19 ENCOUNTER — Encounter: Payer: Self-pay | Admitting: Internal Medicine

## 2014-03-19 VITALS — BP 140/90 | HR 84 | Resp 20 | Ht 68.5 in | Wt 195.0 lb

## 2014-03-19 DIAGNOSIS — B356 Tinea cruris: Secondary | ICD-10-CM

## 2014-03-19 DIAGNOSIS — E1151 Type 2 diabetes mellitus with diabetic peripheral angiopathy without gangrene: Secondary | ICD-10-CM

## 2014-03-19 DIAGNOSIS — E1159 Type 2 diabetes mellitus with other circulatory complications: Secondary | ICD-10-CM | POA: Diagnosis not present

## 2014-03-19 MED ORDER — PIOGLITAZONE HCL 45 MG PO TABS: 45.0000 mg | ORAL_TABLET | Freq: Every day | ORAL | Status: DC

## 2014-03-19 MED ORDER — PIOGLITAZONE HCL 45 MG PO TABS: 45.0000 mg | ORAL_TABLET | ORAL | Status: DC

## 2014-03-19 MED ORDER — FLUCONAZOLE 150 MG PO TABS: ORAL_TABLET | ORAL | Status: DC

## 2014-03-19 NOTE — Progress Notes (Signed)
Pre visit review using our clinic review tool, if applicable. No additional management support is needed unless otherwise documented below in the visit note. 

## 2014-03-19 NOTE — Progress Notes (Signed)
Subjective:    Patient ID: Jason Fisher, male    DOB: [birth year], 74 y.o.   MRN: [id number]  HPI  74 year old patient who has a history of diabetes.  He was seen recently and noted to have a hemoglobin A1c of 9.  For the past several days he has had a rash involving the right groin area.  He has had recurrent intermittent tinea cruris infections over the years;   they have strictly been difficult to treat.  He has been on some OTC medications for the past couple of days  Past Medical History  Diagnosis Date  . 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 Atrial fibrillation 11/06/2009  . BENIGN PROSTATIC HYPERTROPHY, HX OF 02/24/2007  . 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 COLONIC POLYPS, HX OF 11/02/2006  . CORONARY ARTERY DISEASE 11/02/2006  . DIABETES MELLITUS, TYPE II 10/29/2006  . DYSPNEA ON EXERTION 07/07/2008  . HYPERLIPIDEMIA 10/29/2006  . HYPERTENSION 10/29/2006  . MYOCARDIAL INFARCTION, HX OF 11/02/2006  . TOBACCO USER 04/17/2010    History   Social History  . Marital Status: Married    Spouse Name: N/A  . Number of Children: N/A  . Years of Education: N/A   Occupational History  . Not on file.   Social History Main Topics  . Smoking status: Current Some Day Smoker  . Smokeless tobacco: Never Used     Comment: passive smoker as well  . Alcohol Use: Yes  . Drug Use: No  . Sexual Activity: Not on file   Other Topics Concern  . Not on file   Social History Narrative    Past Surgical History  Procedure Laterality Date  . Cataract extraction    . Coronary angioplasty with stent placement    . Shoulder surgery      right    No family history on file.  No Known Allergies  Current Outpatient Prescriptions on File Prior to Visit  Medication Sig Dispense Refill  . atorvastatin (LIPITOR) 20 MG tablet Take 1 tablet (20 mg total) by mouth daily. 90 tablet 1  . chlorthalidone (HYGROTON) 25 MG tablet Take 1 tablet (25 mg total) by mouth daily. 90 tablet 1  .  clopidogrel (PLAVIX) 75 MG tablet Take 1 tablet (75 mg total) by mouth daily. 90 tablet 1  . dabigatran (PRADAXA) 150 MG CAPS capsule TAKE ONE CAPSULE BY MOUTH EVERY 12 HOURS 60 capsule 3  . diltiazem (CARDIZEM CD) 120 MG 24 hr capsule Take 1 capsule (120 mg total) by mouth daily. 90 capsule 1  . glimepiride (AMARYL) 4 MG tablet TAKE 1 TABLET EVERY MORNING BEFORE BREAKFAST 90 tablet 1  . glucose blood (ACCU-CHEK AVIVA PLUS) test strip CHECK BLOOD SUGAR ONCE DAILY PRN 100 each 3  . HYDROcodone-acetaminophen (NORCO/VICODIN) 5-325 MG per tablet Take 1 tablet by mouth every 6 (six) hours as needed. 50 tablet 0  . Insulin Pen Needle 29G X 12.7MM MISC 1 each by Does not apply route 1 day or 1 dose. 100 each 3  . Lancets (ACCU-CHEK MULTICLIX) lancets Once daily 250.00 100 each 3  . Liraglutide (VICTOZA) 18 MG/3ML SOPN INJECT 0.6 MLS INTO THE SKIN 1 DAY FOR 1 DOSE 1 pen 0  . metFORMIN (GLUCOPHAGE) 1000 MG tablet TAKE 1 TABLET TWICE DAILY WITH A MEAL 180 tablet 1  . metoprolol (LOPRESSOR) 100 MG tablet Take 1 tablet (100 mg total) by mouth 2 (two) times daily. 180 tablet 1  . NITROSTAT 0.4 MG SL tablet  PLACE 1 TABLET UNDER TONGUE EVERY 5 MINUTES AS NEEDED 25 tablet 0  . ramipril (ALTACE) 10 MG capsule Take 1 capsule (10 mg total) by mouth daily. 90 capsule 1  . sildenafil (VIAGRA) 100 MG tablet Take 0.5-1 tablets (50-100 mg total) by mouth daily as needed for erectile dysfunction. 5 tablet 11   No current facility-administered medications on file prior to visit.    BP 140/90 mmHg  Pulse 84  Resp 20  Ht 5' 8.5" (1.74 m)  Wt 195 lb (88.451 kg)  BMI 29.21 kg/m2  SpO2 98%     Review of Systems  Skin: Positive for rash.       Objective:   Physical Exam  Constitutional: He appears well-developed and well-nourished. No distress.  Skin:  Erythematous rash in the right groin area with some involvement of the right scrotal sac Left groin normal No satellite lesions          Assessment &  Plan:   Tinea cruris.  Will continue topical therapy.  We'll add Diflucan 150 today and repeat in one week Diabetes mellitus.  Increase Actos to 45 mg daily.  Follow-up hemoglobin A1c in 3 months.  Weight loss diet.  Encouraged

## 2014-03-19 NOTE — Patient Instructions (Signed)
Jock Itch Jock itch is a fungal infection of the skin in the groin area. It is sometimes called "ringworm" even though it is not caused by a worm. A fungus is a type of germ that thrives in dark, damp places.  CAUSES  This infection may spread from:  A fungus infection elsewhere on the body (such as athlete's foot).  Sharing towels or clothing. This infection is more common in:  Hot, humid climates.  People who wear tight-fitting clothing or wet bathing suits for long periods of time.  Athletes.  Overweight people.  People with diabetes. SYMPTOMS  Jock itch causes the following symptoms:  Red, pink or brown rash in the groin. Rash may spread to the thighs, anus, and buttocks.  Itching. DIAGNOSIS  Your caregiver may make the diagnosis by looking at the rash. Sometimes a skin scraping will be sent to test for fungus. Testing can be done either by looking under the microscope or by doing a culture (test to try to grow the fungus). A culture can take up to 2 weeks to come back. TREATMENT  Jock itch may be treated with:  Skin cream or ointment to kill fungus.  Medicine by mouth to kill fungus.  Skin cream or ointment to calm the itching.  Compresses or medicated powders to dry the infected skin. HOME CARE INSTRUCTIONS   Be sure to treat the rash completely. Follow your caregiver's instructions. It can take a couple of weeks to treat. If you do not treat the infection long enough, the rash can come back.  Wear loose-fitting clothing.  Men should wear cotton boxer shorts.  Women should wear cotton underwear.  Avoid hot baths.  Dry the groin area well after bathing. SEEK MEDICAL CARE IF:   Your rash is worse.  Your rash is spreading.  Your rash returns after treatment is finished.  Your rash is not gone in 4 weeks. Fungal infections are slow to respond to treatment. Some redness may remain for several weeks after the fungus is gone. SEEK IMMEDIATE MEDICAL CARE  IF:  The area becomes red, warm, tender, and swollen.  You have a fever. Document Released: 01/09/2002 Document Revised: 04/13/2011 Document Reviewed: 12/09/2007 Exit[employer] Patient Information 2015 [employer]. This information is not intended to replace advice given to you by your health care provider. Make sure you discuss any questions you have with your health care provider.  

## 2014-03-22 ENCOUNTER — Ambulatory Visit (INDEPENDENT_AMBULATORY_CARE_PROVIDER_SITE_OTHER): Payer: Medicare Other | Admitting: Internal Medicine

## 2014-03-22 ENCOUNTER — Encounter: Payer: Self-pay | Admitting: Internal Medicine

## 2014-03-22 VITALS — BP 120/80 | HR 86 | Temp 97.5°F | Resp 20 | Ht 68.5 in | Wt 199.0 lb

## 2014-03-22 DIAGNOSIS — B358 Other dermatophytoses: Secondary | ICD-10-CM | POA: Diagnosis not present

## 2014-03-22 DIAGNOSIS — E1159 Type 2 diabetes mellitus with other circulatory complications: Secondary | ICD-10-CM

## 2014-03-22 DIAGNOSIS — E1151 Type 2 diabetes mellitus with diabetic peripheral angiopathy without gangrene: Secondary | ICD-10-CM

## 2014-03-22 DIAGNOSIS — I10 Essential (primary) hypertension: Secondary | ICD-10-CM

## 2014-03-22 MED ORDER — TERBINAFINE HCL 250 MG PO TABS: 250.0000 mg | ORAL_TABLET | Freq: Every day | ORAL | Status: DC

## 2014-03-22 MED ORDER — CLOTRIMAZOLE 1 % EX CREA: 1.0000 "application " | TOPICAL_CREAM | Freq: Two times a day (BID) | CUTANEOUS | Status: DC

## 2014-03-22 MED ORDER — HYDROCODONE-ACETAMINOPHEN 5-325 MG PO TABS: 1.0000 | ORAL_TABLET | Freq: Four times a day (QID) | ORAL | Status: DC | PRN

## 2014-03-22 MED ORDER — AMOXICILLIN-POT CLAVULANATE 875-125 MG PO TABS: 1.0000 | ORAL_TABLET | Freq: Two times a day (BID) | ORAL | Status: DC

## 2014-03-22 NOTE — Patient Instructions (Addendum)
Dermatology appointment as discussed  Take all medications as prescribed  Call or return to clinic prn if these symptoms worsen or fail to improve as anticipated.

## 2014-03-22 NOTE — Progress Notes (Signed)
Pre visit review using our clinic review tool, if applicable. No additional management support is needed unless otherwise documented below in the visit note. 

## 2014-03-22 NOTE — Progress Notes (Signed)
Subjective:    Patient ID: Jason Fisher, male    DOB: 1940/03/01, 74 y.o.   MRN: [id number]  HPI  74 year old patient who is seen today in follow-up.  He has a history of type 2 diabetes that has not been well controlled.  He was seen 3 days ago for treatment of a right groin dermatitis.  He had been using Lotrimin for suspected tinea cruris.  He received a dose of fluconazole with a prescription for a second dose in one week.  He was given patient information concerning local wound care, which he has tried to adhere to.  In spite of these measures, he continues to worsen with increasing dermatitis which has now become much more painful and weeping.  Past Medical History  Diagnosis Date  . 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 Atrial fibrillation 11/06/2009  . BENIGN PROSTATIC HYPERTROPHY, HX OF 02/24/2007  . 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 COLONIC POLYPS, HX OF 11/02/2006  . CORONARY ARTERY DISEASE 11/02/2006  . DIABETES MELLITUS, TYPE II 10/29/2006  . DYSPNEA ON EXERTION 07/07/2008  . HYPERLIPIDEMIA 10/29/2006  . HYPERTENSION 10/29/2006  . MYOCARDIAL INFARCTION, HX OF 11/02/2006  . TOBACCO USER 04/17/2010    History   Social History  . Marital Status: Married    Spouse Name: N/A  . Number of Children: N/A  . Years of Education: N/A   Occupational History  . Not on file.   Social History Main Topics  . Smoking status: Current Some Day Smoker  . Smokeless tobacco: Never Used     Comment: passive smoker as well  . Alcohol Use: Yes  . Drug Use: No  . Sexual Activity: Not on file   Other Topics Concern  . Not on file   Social History Narrative    Past Surgical History  Procedure Laterality Date  . Cataract extraction    . Coronary angioplasty with stent placement    . Shoulder surgery      right    No family history on file.  No Known Allergies  Current Outpatient Prescriptions on File Prior to Visit  Medication Sig Dispense Refill  . atorvastatin  (LIPITOR) 20 MG tablet Take 1 tablet (20 mg total) by mouth daily. 90 tablet 1  . chlorthalidone (HYGROTON) 25 MG tablet Take 1 tablet (25 mg total) by mouth daily. 90 tablet 1  . clopidogrel (PLAVIX) 75 MG tablet Take 1 tablet (75 mg total) by mouth daily. 90 tablet 1  . dabigatran (PRADAXA) 150 MG CAPS capsule TAKE ONE CAPSULE BY MOUTH EVERY 12 HOURS 60 capsule 3  . diltiazem (CARDIZEM CD) 120 MG 24 hr capsule Take 1 capsule (120 mg total) by mouth daily. 90 capsule 1  . fluconazole (DIFLUCAN) 150 MG tablet 1 tablet now and repeat in one week 2 tablet 0  . glimepiride (AMARYL) 4 MG tablet TAKE 1 TABLET EVERY MORNING BEFORE BREAKFAST 90 tablet 1  . glucose blood (ACCU-CHEK AVIVA PLUS) test strip CHECK BLOOD SUGAR ONCE DAILY PRN 100 each 3  . HYDROcodone-acetaminophen (NORCO/VICODIN) 5-325 MG per tablet Take 1 tablet by mouth every 6 (six) hours as needed. 50 tablet 0  . Insulin Pen Needle 29G X 12.7MM MISC 1 each by Does not apply route 1 day or 1 dose. 100 each 3  . Lancets (ACCU-CHEK MULTICLIX) lancets Once daily 250.00 100 each 3  . Liraglutide (VICTOZA) 18 MG/3ML SOPN INJECT 0.6 MLS INTO THE SKIN 1 DAY FOR 1 DOSE 1 pen  0  . metFORMIN (GLUCOPHAGE) 1000 MG tablet TAKE 1 TABLET TWICE DAILY WITH A MEAL 180 tablet 1  . metoprolol (LOPRESSOR) 100 MG tablet Take 1 tablet (100 mg total) by mouth 2 (two) times daily. 180 tablet 1  . NITROSTAT 0.4 MG SL tablet PLACE 1 TABLET UNDER TONGUE EVERY 5 MINUTES AS NEEDED 25 tablet 0  . pioglitazone (ACTOS) 45 MG tablet Take 1 tablet (45 mg total) by mouth daily. 90 tablet 3  . ramipril (ALTACE) 10 MG capsule Take 1 capsule (10 mg total) by mouth daily. 90 capsule 1  . sildenafil (VIAGRA) 100 MG tablet Take 0.5-1 tablets (50-100 mg total) by mouth daily as needed for erectile dysfunction. 5 tablet 11   No current facility-administered medications on file prior to visit.    BP 120/80 mmHg  Pulse 86  Temp(Src) 97.5 F (36.4 C) (Oral)  Resp 20  Ht 5' 8.5"  (1.74 m)  Wt 199 lb (90.266 kg)  BMI 29.81 kg/m2  SpO2 96%     Review of Systems  Skin: Positive for rash and wound.       Objective:   Physical Exam  Constitutional: He appears well-developed and well-nourished. No distress.  Afebrile Uncomfortable No tachycardia  Skin:  Mild erythema in the intertriginous area of the left groin  Marked erythema with skin desquamation and oozing from the right groin and upper medial thigh and right scrotal regions.  The dermatitis was tender to touch          Assessment & Plan:   Right groin dermatitis which has worsened over the past 3 days in  spite of topical and oral antifungal medication.  Concern about a secondary bacterial cellulitis.  We'll treat with Augmentin.  Will continue aggressive local wound measures topical and oral antifungal medications.  Will set up for dermatologic referral

## 2014-03-26 DIAGNOSIS — L304 Erythema intertrigo: Secondary | ICD-10-CM | POA: Diagnosis not present

## 2014-03-26 DIAGNOSIS — Z85828 Personal history of other malignant neoplasm of skin: Secondary | ICD-10-CM | POA: Diagnosis not present

## 2014-03-26 DIAGNOSIS — Z08 Encounter for follow-up examination after completed treatment for malignant neoplasm: Secondary | ICD-10-CM | POA: Diagnosis not present

## 2014-04-14 ENCOUNTER — Other Ambulatory Visit: Payer: Self-pay | Admitting: Internal Medicine

## 2014-05-28 ENCOUNTER — Other Ambulatory Visit: Payer: Self-pay | Admitting: Internal Medicine

## 2014-06-05 ENCOUNTER — Ambulatory Visit (INDEPENDENT_AMBULATORY_CARE_PROVIDER_SITE_OTHER): Payer: Medicare Other | Admitting: Internal Medicine

## 2014-06-05 ENCOUNTER — Encounter: Payer: Self-pay | Admitting: Internal Medicine

## 2014-06-05 VITALS — BP 136/80 | HR 72 | Temp 97.7°F | Resp 20 | Ht 68.5 in | Wt 201.0 lb

## 2014-06-05 DIAGNOSIS — I10 Essential (primary) hypertension: Secondary | ICD-10-CM | POA: Diagnosis not present

## 2014-06-05 DIAGNOSIS — E785 Hyperlipidemia, unspecified: Secondary | ICD-10-CM | POA: Diagnosis not present

## 2014-06-05 DIAGNOSIS — E1159 Type 2 diabetes mellitus with other circulatory complications: Secondary | ICD-10-CM | POA: Diagnosis not present

## 2014-06-05 DIAGNOSIS — I252 Old myocardial infarction: Secondary | ICD-10-CM

## 2014-06-05 DIAGNOSIS — I480 Paroxysmal atrial fibrillation: Secondary | ICD-10-CM

## 2014-06-05 DIAGNOSIS — Z720 Tobacco use: Secondary | ICD-10-CM | POA: Diagnosis not present

## 2014-06-05 DIAGNOSIS — F17200 Nicotine dependence, unspecified, uncomplicated: Secondary | ICD-10-CM

## 2014-06-05 DIAGNOSIS — E1151 Type 2 diabetes mellitus with diabetic peripheral angiopathy without gangrene: Secondary | ICD-10-CM

## 2014-06-05 LAB — HEMOGLOBIN A1C: Hgb A1c MFr Bld: 7.7 % — ABNORMAL HIGH (ref 4.6–6.5)

## 2014-06-05 NOTE — Progress Notes (Signed)
Subjective:    Patient ID: Jason Fisher, male    DOB: 09-May-1940, 74 y.o.   MRN: [id number]  HPI  74 year old patient who is seen today for follow-up.  Hemoglobin A1c 3 months ago was 9.0.  He states that he has been compliant with his medications.  His severe inguinal dermatitis has resolved.  He has a history of coronary artery disease and atrial fibrillation which has been stable.  Denies any palpitations.  No exertional chest pain He has a history tobacco use, but states that he has been abstinent now for 6 months.  He has rare cough and mild DOE. No recent eye exam  Past Medical History  Diagnosis Date  . 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 Atrial fibrillation 11/06/2009  . BENIGN PROSTATIC HYPERTROPHY, HX OF 02/24/2007  . 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 COLONIC POLYPS, HX OF 11/02/2006  . CORONARY ARTERY DISEASE 11/02/2006  . DIABETES MELLITUS, TYPE II 10/29/2006  . DYSPNEA ON EXERTION 07/07/2008  . HYPERLIPIDEMIA 10/29/2006  . HYPERTENSION 10/29/2006  . MYOCARDIAL INFARCTION, HX OF 11/02/2006  . TOBACCO USER 04/17/2010    History   Social History  . Marital Status: Married    Spouse Name: N/A  . Number of Children: N/A  . Years of Education: N/A   Occupational History  . Not on file.   Social History Main Topics  . Smoking status: Current Some Day Smoker  . Smokeless tobacco: Never Used     Comment: passive smoker as well  . Alcohol Use: Yes  . Drug Use: No  . Sexual Activity: Not on file   Other Topics Concern  . Not on file   Social History Narrative    Past Surgical History  Procedure Laterality Date  . Cataract extraction    . Coronary angioplasty with stent placement    . Shoulder surgery      right    No family history on file.  No Known Allergies  Current Outpatient Prescriptions on File Prior to Visit  Medication Sig Dispense Refill  . atorvastatin (LIPITOR) 20 MG tablet Take 1 tablet (20 mg total) by mouth daily. 90 tablet 1  .  chlorthalidone (HYGROTON) 25 MG tablet Take 1 tablet (25 mg total) by mouth daily. 90 tablet 1  . clopidogrel (PLAVIX) 75 MG tablet Take 1 tablet (75 mg total) by mouth daily. 90 tablet 1  . clotrimazole (LOTRIMIN) 1 % cream Apply 1 application topically 2 (two) times daily. 30 g 0  . dabigatran (PRADAXA) 150 MG CAPS capsule TAKE ONE CAPSULE BY MOUTH EVERY 12 HOURS 60 capsule 3  . diltiazem (CARDIZEM CD) 120 MG 24 hr capsule Take 1 capsule (120 mg total) by mouth daily. 90 capsule 1  . diltiazem (CARDIZEM CD) 120 MG 24 hr capsule TAKE 1 CAPSULE EVERY DAY 90 capsule 1  . glimepiride (AMARYL) 4 MG tablet TAKE 1 TABLET EVERY MORNING BEFORE BREAKFAST 90 tablet 1  . glimepiride (AMARYL) 4 MG tablet TAKE 1 TABLET EVERY MORNING BEFORE BREAKFAST 90 tablet 1  . glucose blood (ACCU-CHEK AVIVA PLUS) test strip CHECK BLOOD SUGAR ONCE DAILY PRN 100 each 3  . HYDROcodone-acetaminophen (NORCO/VICODIN) 5-325 MG per tablet Take 1 tablet by mouth every 6 (six) hours as needed for moderate pain. 30 tablet 0  . Insulin Pen Needle 29G X 12.7MM MISC 1 each by Does not apply route 1 day or 1 dose. 100 each 3  . Lancets (ACCU-CHEK MULTICLIX) lancets Once daily 250.00  100 each 3  . metFORMIN (GLUCOPHAGE) 1000 MG tablet TAKE 1 TABLET TWICE DAILY WITH A MEAL 180 tablet 1  . metoprolol (LOPRESSOR) 100 MG tablet Take 1 tablet (100 mg total) by mouth 2 (two) times daily. 180 tablet 1  . NITROSTAT 0.4 MG SL tablet PLACE 1 TABLET UNDER TONGUE EVERY 5 MINUTES AS NEEDED 25 tablet 0  . pioglitazone (ACTOS) 45 MG tablet Take 1 tablet (45 mg total) by mouth daily. 90 tablet 3  . ramipril (ALTACE) 10 MG capsule Take 1 capsule (10 mg total) by mouth daily. 90 capsule 1  . sildenafil (VIAGRA) 100 MG tablet Take 0.5-1 tablets (50-100 mg total) by mouth daily as needed for erectile dysfunction. 5 tablet 11  . terbinafine (LAMISIL) 250 MG tablet Take 1 tablet (250 mg total) by mouth daily. (Patient not taking: Reported on 06/05/2014) 14  tablet 1  . VICTOZA 18 MG/3ML SOPN INJECT 0.6 INTO THE SKIN DAILY AS DIRECTED 9 pen 1   No current facility-administered medications on file prior to visit.    BP 136/80 mmHg  Pulse 72  Temp(Src) 97.7 F (36.5 C) (Oral)  Resp 20  Ht 5' 8.5" (1.74 m)  Wt 201 lb (91.173 kg)  BMI 30.11 kg/m2  SpO2 96%       Review of Systems  Constitutional: Negative for fever, chills, appetite change and fatigue.  HENT: Negative for congestion, dental problem, ear pain, hearing loss, sore throat, tinnitus, trouble swallowing and voice change.   Eyes: Negative for pain, discharge and visual disturbance.  Respiratory: Positive for cough. Negative for chest tightness, wheezing and stridor.   Cardiovascular: Negative for chest pain, palpitations and leg swelling.  Gastrointestinal: Negative for nausea, vomiting, abdominal pain, diarrhea, constipation, blood in stool and abdominal distention.  Genitourinary: Negative for urgency, hematuria, flank pain, discharge, difficulty urinating and genital sores.  Musculoskeletal: Negative for myalgias, back pain, joint swelling, arthralgias, gait problem and neck stiffness.  Skin: Negative for rash.  Neurological: Negative for dizziness, syncope, speech difficulty, weakness, numbness and headaches.  Hematological: Negative for adenopathy. Does not bruise/bleed easily.  Psychiatric/Behavioral: Negative for behavioral problems and dysphoric mood. The patient is not nervous/anxious.        Objective:   Physical Exam  Constitutional: He is oriented to person, place, and time. He appears well-developed.  HENT:  Head: Normocephalic.  Right Ear: External ear normal.  Left Ear: External ear normal.  Eyes: Conjunctivae and EOM are normal.  Neck: Normal range of motion.  Cardiovascular: Normal rate and normal heart sounds.   Pulmonary/Chest: No respiratory distress. He has no wheezes. He has no rales.  Breath sounds slightly diminished  Abdominal: Bowel sounds  are normal.  Musculoskeletal: Normal range of motion. He exhibits no edema or tenderness.  Neurological: He is alert and oriented to person, place, and time.  Psychiatric: He has a normal mood and affect. His behavior is normal.          Assessment & Plan:   Diabetes mellitus.  Will check a hemoglobin A1c.  If still not well controlled will consider up titration of Victoza Hypertension, stable Coronary artery disease, stable Paroxysmal atrial fibrillation.  Rhythm regular.  Today History tobacco use.  Smoking abstinence for 6 months  I examination encouraged Recheck 3 months

## 2014-06-05 NOTE — Patient Instructions (Signed)
Please check your hemoglobin A1c every 3 months  Limit your sodium (Salt) intake    It is important that you exercise regularly, at least 20 minutes 3 to 4 times per week.  If you develop chest pain or shortness of breath seek  medical attention.  Please see your eye doctor yearly to check for diabetic eye damage

## 2014-06-05 NOTE — Progress Notes (Signed)
Pre visit review using our clinic review tool, if applicable. No additional management support is needed unless otherwise documented below in the visit note. 

## 2014-06-19 DIAGNOSIS — H40033 Anatomical narrow angle, bilateral: Secondary | ICD-10-CM | POA: Diagnosis not present

## 2014-06-19 DIAGNOSIS — H26499 Other secondary cataract, unspecified eye: Secondary | ICD-10-CM | POA: Diagnosis not present

## 2014-07-16 ENCOUNTER — Encounter: Payer: Self-pay | Admitting: Internal Medicine

## 2014-07-16 ENCOUNTER — Telehealth: Payer: Self-pay | Admitting: Internal Medicine

## 2014-07-16 ENCOUNTER — Ambulatory Visit (INDEPENDENT_AMBULATORY_CARE_PROVIDER_SITE_OTHER): Payer: Medicare Other | Admitting: Internal Medicine

## 2014-07-16 VITALS — BP 126/84 | HR 78 | Temp 97.6°F | Resp 20 | Ht 68.5 in | Wt 207.0 lb

## 2014-07-16 DIAGNOSIS — I480 Paroxysmal atrial fibrillation: Secondary | ICD-10-CM

## 2014-07-16 DIAGNOSIS — E1159 Type 2 diabetes mellitus with other circulatory complications: Secondary | ICD-10-CM

## 2014-07-16 DIAGNOSIS — I252 Old myocardial infarction: Secondary | ICD-10-CM

## 2014-07-16 DIAGNOSIS — R0602 Shortness of breath: Secondary | ICD-10-CM | POA: Diagnosis not present

## 2014-07-16 DIAGNOSIS — I2510 Atherosclerotic heart disease of native coronary artery without angina pectoris: Secondary | ICD-10-CM

## 2014-07-16 DIAGNOSIS — I10 Essential (primary) hypertension: Secondary | ICD-10-CM | POA: Diagnosis not present

## 2014-07-16 DIAGNOSIS — E1151 Type 2 diabetes mellitus with diabetic peripheral angiopathy without gangrene: Secondary | ICD-10-CM

## 2014-07-16 LAB — BRAIN NATRIURETIC PEPTIDE: PRO B NATRI PEPTIDE: 175 pg/mL — AB (ref 0.0–100.0)

## 2014-07-16 MED ORDER — CLOTRIMAZOLE 1 % EX CREA: 1.0000 "application " | TOPICAL_CREAM | Freq: Two times a day (BID) | CUTANEOUS | Status: DC

## 2014-07-16 NOTE — Progress Notes (Signed)
Pre visit review using our clinic review tool, if applicable. No additional management support is needed unless otherwise documented below in the visit note. 

## 2014-07-16 NOTE — Telephone Encounter (Signed)
[facility] - Client [place] [facility] Patient Name: Jason Fisher DOB: 10/18/1940 Initial Comment caller states he stays tired alot and is having shortness of breath Nurse Assessment Nurse: Markus Daft, RN, Sherre Poot Date/Time (Eastern Time): 07/16/2014 9:08:33 AM Confirm and document reason for call. If symptomatic, describe symptoms. ---Caller states he states he has had fatigue, and is having shortness of breath with walking and working in the yard. He has an appt in August, but his family wanted him seen sooner. He has had for a few months. - Denies CP, swelling in legs/feet, or fever. Has the patient traveled out of the country within the last 30 days? ---No Does the patient require triage? ---Yes Related visit to physician within the last 2 weeks? ---No Does the PT have any chronic conditions? (i.e. diabetes, asthma, etc.) ---Yes List chronic conditions. ---Stents in heart, last one was 4 years ago; CAD, HTN, IDDM, h/o smoking for 50 years- quit one year ago Guidelines Guideline Title Affirmed Question Affirmed Notes Breathing Difficulty [1] MILD difficulty breathing (e.g., minimal/no SOB at rest, SOB with walking, pulse <100) AND [2] NEW-onset or WORSE than normal Final Disposition User See Physician within 4 Hours (or PCP triage) Markus Daft, RN, Sherre Poot Comments Increasing SOB noticed with walking and doing ADL's. Appt made with Dr. Sharren Bridge for 2:30 pm. today.

## 2014-07-16 NOTE — Progress Notes (Signed)
Subjective:    Patient ID: Jason Fisher, male    DOB: Jun 27, 1940, 74 y.o.   MRN: [id number]  HPI  74 year old patient who has a history of coronary artery disease, prior MI and remote history tobacco use.  He has been off tobacco products for almost 1 year. He presents today with a complaint of increasing fatigue and dyspnea on exertion.  He probably was at a family reunion recently and there was some concern.  He feels that his fatigue and DOE have basically been unchanged for some time.  He denies any real new symptoms.  He did have an eye examination 2 weeks ago  Denies any exertional chest pain.  Wt Readings from Last 3 Encounters:  07/16/14 207 lb (93.895 kg)  06/05/14 201 lb (91.173 kg)  03/22/14 199 lb (90.266 kg)    Lab Results  Component Value Date   HGBA1C 7.7* 06/05/2014    Diabetic medications.  Intensified one month ago when his hemoglobin A1c was 7.7  Past Medical History  Diagnosis Date  . 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 Atrial fibrillation 11/06/2009  . BENIGN PROSTATIC HYPERTROPHY, HX OF 02/24/2007  . 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 COLONIC POLYPS, HX OF 11/02/2006  . CORONARY ARTERY DISEASE 11/02/2006  . DIABETES MELLITUS, TYPE II 10/29/2006  . DYSPNEA ON EXERTION 07/07/2008  . HYPERLIPIDEMIA 10/29/2006  . HYPERTENSION 10/29/2006  . MYOCARDIAL INFARCTION, HX OF 11/02/2006  . TOBACCO USER 04/17/2010    History   Social History  . Marital Status: Married    Spouse Name: N/A  . Number of Children: N/A  . Years of Education: N/A   Occupational History  . Not on file.   Social History Main Topics  . Smoking status: Current Some Day Smoker  . Smokeless tobacco: Never Used     Comment: passive smoker as well  . Alcohol Use: Yes  . Drug Use: No  . Sexual Activity: Not on file   Other Topics Concern  . Not on file   Social History Narrative    Past Surgical History  Procedure Laterality Date  . Cataract extraction    . Coronary  angioplasty with stent placement    . Shoulder surgery      right    No family history on file.  No Known Allergies  Current Outpatient Prescriptions on File Prior to Visit  Medication Sig Dispense Refill  . atorvastatin (LIPITOR) 20 MG tablet Take 1 tablet (20 mg total) by mouth daily. 90 tablet 1  . chlorthalidone (HYGROTON) 25 MG tablet Take 1 tablet (25 mg total) by mouth daily. 90 tablet 1  . clopidogrel (PLAVIX) 75 MG tablet Take 1 tablet (75 mg total) by mouth daily. 90 tablet 1  . dabigatran (PRADAXA) 150 MG CAPS capsule TAKE ONE CAPSULE BY MOUTH EVERY 12 HOURS 60 capsule 3  . diltiazem (CARDIZEM CD) 120 MG 24 hr capsule Take 1 capsule (120 mg total) by mouth daily. 90 capsule 1  . glimepiride (AMARYL) 4 MG tablet TAKE 1 TABLET EVERY MORNING BEFORE BREAKFAST 90 tablet 1  . glucose blood (ACCU-CHEK AVIVA PLUS) test strip CHECK BLOOD SUGAR ONCE DAILY PRN 100 each 3  . HYDROcodone-acetaminophen (NORCO/VICODIN) 5-325 MG per tablet Take 1 tablet by mouth every 6 (six) hours as needed for moderate pain. 30 tablet 0  . Insulin Pen Needle 29G X 12.7MM MISC 1 each by Does not apply route 1 day or 1 dose. 100 each 3  .  Lancets (ACCU-CHEK MULTICLIX) lancets Once daily 250.00 100 each 3  . metFORMIN (GLUCOPHAGE) 1000 MG tablet TAKE 1 TABLET TWICE DAILY WITH A MEAL 180 tablet 1  . metoprolol (LOPRESSOR) 100 MG tablet Take 1 tablet (100 mg total) by mouth 2 (two) times daily. 180 tablet 1  . NITROSTAT 0.4 MG SL tablet PLACE 1 TABLET UNDER TONGUE EVERY 5 MINUTES AS NEEDED 25 tablet 0  . pioglitazone (ACTOS) 45 MG tablet Take 1 tablet (45 mg total) by mouth daily. 90 tablet 3  . ramipril (ALTACE) 10 MG capsule Take 1 capsule (10 mg total) by mouth daily. 90 capsule 1  . sildenafil (VIAGRA) 100 MG tablet Take 0.5-1 tablets (50-100 mg total) by mouth daily as needed for erectile dysfunction. 5 tablet 11  . VICTOZA 18 MG/3ML SOPN INJECT 0.6 INTO THE SKIN DAILY AS DIRECTED 9 pen 1   No current  facility-administered medications on file prior to visit.    BP 126/84 mmHg  Pulse 78  Temp(Src) 97.6 F (36.4 C) (Oral)  Resp 20  Ht 5' 8.5" (1.74 m)  Wt 207 lb (93.895 kg)  BMI 31.01 kg/m2  SpO2 97%      Review of Systems  Constitutional: Positive for fatigue. Negative for fever, chills and appetite change.  HENT: Negative for congestion, dental problem, ear pain, hearing loss, sore throat, tinnitus, trouble swallowing and voice change.   Eyes: Negative for pain, discharge and visual disturbance.  Respiratory: Positive for shortness of breath. Negative for cough, chest tightness, wheezing and stridor.   Cardiovascular: Negative for chest pain, palpitations and leg swelling.  Gastrointestinal: Negative for nausea, vomiting, abdominal pain, diarrhea, constipation, blood in stool and abdominal distention.  Genitourinary: Negative for urgency, hematuria, flank pain, discharge, difficulty urinating and genital sores.  Musculoskeletal: Negative for myalgias, back pain, joint swelling, arthralgias, gait problem and neck stiffness.  Skin: Negative for rash.  Neurological: Negative for dizziness, syncope, speech difficulty, weakness, numbness and headaches.  Hematological: Negative for adenopathy. Does not bruise/bleed easily.  Psychiatric/Behavioral: Negative for behavioral problems and dysphoric mood. The patient is not nervous/anxious.        Objective:   Physical Exam  Constitutional: He is oriented to person, place, and time. He appears well-developed.  HENT:  Head: Normocephalic.  Right Ear: External ear normal.  Left Ear: External ear normal.  Eyes: Conjunctivae and EOM are normal.  Neck: Normal range of motion.  Cardiovascular: Normal rate and normal heart sounds.   Slightly irregular  Pulmonary/Chest:  Breath sounds are diminished Few scattered rhonchi No rales  Abdominal: Bowel sounds are normal.  Musculoskeletal: Normal range of motion. He exhibits no edema or  tenderness.  Neurological: He is alert and oriented to person, place, and time.  Psychiatric: He has a normal mood and affect. His behavior is normal.          Assessment & Plan:   Fatigue and dyspnea.  These appear to be chronic complaints with no recent worsening.  There has been some weight gain.  Very little on clinical exam. to Suggest Decompensated heart failure.  Will check a BNP Diabetes mellitus.  Will recheck hemoglobin A1c in 2 months Atrial fibrillation.  Continue  anticoagulation Coronary artery disease.  Status post stenting.  Patient remains on Plavix.  Will schedule cardiology follow-up  Return in 2 months for follow-up

## 2014-07-16 NOTE — Patient Instructions (Signed)
Limit your sodium (Salt) intake  You need to lose weight.  Consider a lower calorie diet and regular exercise.    It is important that you exercise regularly, at least 20 minutes 3 to 4 times per week.  If you develop chest pain or shortness of breath seek  medical attention.  Cardiology follow-up as scheduled  Return in 3 months for follow-up

## 2014-07-18 ENCOUNTER — Ambulatory Visit (INDEPENDENT_AMBULATORY_CARE_PROVIDER_SITE_OTHER): Payer: Medicare Other | Admitting: Cardiology

## 2014-07-18 ENCOUNTER — Encounter: Payer: Self-pay | Admitting: Cardiology

## 2014-07-18 VITALS — BP 100/60 | HR 70 | Ht 68.0 in | Wt 206.0 lb

## 2014-07-18 DIAGNOSIS — I714 Abdominal aortic aneurysm, without rupture, unspecified: Secondary | ICD-10-CM

## 2014-07-18 DIAGNOSIS — I25118 Atherosclerotic heart disease of native coronary artery with other forms of angina pectoris: Secondary | ICD-10-CM | POA: Diagnosis not present

## 2014-07-18 DIAGNOSIS — I482 Chronic atrial fibrillation, unspecified: Secondary | ICD-10-CM

## 2014-07-18 DIAGNOSIS — I10 Essential (primary) hypertension: Secondary | ICD-10-CM

## 2014-07-18 DIAGNOSIS — Z720 Tobacco use: Secondary | ICD-10-CM

## 2014-07-18 DIAGNOSIS — F17200 Nicotine dependence, unspecified, uncomplicated: Secondary | ICD-10-CM

## 2014-07-18 DIAGNOSIS — E785 Hyperlipidemia, unspecified: Secondary | ICD-10-CM

## 2014-07-18 NOTE — Patient Instructions (Signed)
Continue your current therapy  Congratulations on quitting smoking  Focus on regular exercise and losing some weight  I will see you in one year

## 2014-07-18 NOTE — Progress Notes (Signed)
Cardiology Office Note   Date:  07/18/2014   ID:  Jason Fisher, DOB 1940-09-15, MRN [id number]  PCP:  Nyoka Cowden, MD  Cardiologist:   Peter Martinique, MD   Chief Complaint  Patient presents with  . Fatigue  . Shortness of Breath      History of Present Illness: Jason Fisher is a 74 y.o. male who is seen at the request of Dr. Inda Merlin for evaluation of dyspnea. He has an extensive cardiac history but has not seen cardiology in over 3 years. He has a history of CAD. Cardiac cath in 2002 showed extensive nonobstructive CAD. In 2007 he underwent stenting of the RCA with 2 BMS. In 2009 he had restenosis and had stenting of the proximal to mid RCA with a long DES. In 2012 he had a Myoview study showing a fixed basal to mid inferior scar. No ischemia and EF 45%. He is on Plavix. In November 2011 he had a Echocardiogram showing EF 50-55%. No significant valve abnormality. He has a history of chronic atrial fibrillation and has been on Pradaxa chronically. He has a AAA measuring 4.1-4.2 cm.  In general he feels he is doing well. He does note he gets more tired than in the past. He does note some SOB going up stairs. He doesn't think these symptoms have changed recently. No chest pain or edema. No dizziness or palpitations. No history of any bleeding problems.     Past Medical History  Diagnosis Date  . 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 Atrial fibrillation 11/06/2009  . BENIGN PROSTATIC HYPERTROPHY, HX OF 02/24/2007  . 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 COLONIC POLYPS, HX OF 11/02/2006  . CORONARY ARTERY DISEASE 11/02/2006  . DIABETES MELLITUS, TYPE II 10/29/2006  . DYSPNEA ON EXERTION 07/07/2008  . HYPERLIPIDEMIA 10/29/2006  . HYPERTENSION 10/29/2006  . MYOCARDIAL INFARCTION, HX OF 11/02/2006  . TOBACCO USER 04/17/2010    Past Surgical History  Procedure Laterality Date  . Cataract extraction    . Coronary angioplasty with stent placement    . Shoulder surgery      right     Current Outpatient Prescriptions  Medication Sig Dispense Refill  . atorvastatin (LIPITOR) 20 MG tablet Take 1 tablet (20 mg total) by mouth daily. 90 tablet 1  . chlorthalidone (HYGROTON) 25 MG tablet Take 1 tablet (25 mg total) by mouth daily. 90 tablet 1  . clopidogrel (PLAVIX) 75 MG tablet Take 1 tablet (75 mg total) by mouth daily. 90 tablet 1  . clotrimazole (LOTRIMIN) 1 % cream Apply 1 application topically 2 (two) times daily. 30 g 2  . dabigatran (PRADAXA) 150 MG CAPS capsule TAKE ONE CAPSULE BY MOUTH EVERY 12 HOURS 60 capsule 3  . diltiazem (CARDIZEM CD) 120 MG 24 hr capsule Take 1 capsule (120 mg total) by mouth daily. 90 capsule 1  . glimepiride (AMARYL) 4 MG tablet TAKE 1 TABLET EVERY MORNING BEFORE BREAKFAST 90 tablet 1  . glucose blood (ACCU-CHEK AVIVA PLUS) test strip CHECK BLOOD SUGAR ONCE DAILY PRN 100 each 3  . HYDROcodone-acetaminophen (NORCO/VICODIN) 5-325 MG per tablet Take 1 tablet by mouth every 6 (six) hours as needed for moderate pain. 30 tablet 0  . Insulin Pen Needle 29G X 12.7MM MISC 1 each by Does not apply route 1 day or 1 dose. 100 each 3  . Lancets (ACCU-CHEK MULTICLIX) lancets Once daily 250.00 100 each 3  . metFORMIN (GLUCOPHAGE) 1000 MG tablet TAKE 1 TABLET  TWICE DAILY WITH A MEAL 180 tablet 1  . metoprolol (LOPRESSOR) 100 MG tablet Take 1 tablet (100 mg total) by mouth 2 (two) times daily. 180 tablet 1  . NITROSTAT 0.4 MG SL tablet PLACE 1 TABLET UNDER TONGUE EVERY 5 MINUTES AS NEEDED 25 tablet 0  . pioglitazone (ACTOS) 45 MG tablet Take 1 tablet (45 mg total) by mouth daily. 90 tablet 3  . ramipril (ALTACE) 10 MG capsule Take 1 capsule (10 mg total) by mouth daily. 90 capsule 1  . sildenafil (VIAGRA) 100 MG tablet Take 0.5-1 tablets (50-100 mg total) by mouth daily as needed for erectile dysfunction. 5 tablet 11  . VICTOZA 18 MG/3ML SOPN INJECT 0.6 INTO THE SKIN DAILY AS DIRECTED 9 pen 1   No current facility-administered medications for this  visit.    Allergies:   Review of patient's allergies indicates no known allergies.    Social History:  The patient  reports that he quit smoking about 11 months ago. He has never used smokeless tobacco. He reports that he drinks alcohol. He reports that he does not use illicit drugs.   Family History:  The patient's family history includes Heart attack in his father and mother; Hypertension in his father.    ROS:  Please see the history of present illness.   Otherwise, review of systems are positive for none.   All other systems are reviewed and negative.    PHYSICAL EXAM: VS:  BP 100/60 mmHg  Pulse 70  Ht 5\' 8"  (1.727 m)  Wt 93.441 kg (206 lb)  BMI 31.33 kg/m2 , BMI Body mass index is 31.33 kg/(m^2). GEN: Well nourished, obese, in no acute distress HEENT: normal Neck: no JVD, carotid bruits, or masses Cardiac: IRRR; no murmurs, rubs, or gallops,no edema  Respiratory:  clear to auscultation bilaterally, normal work of breathing GI: soft, nontender, nondistended, + BS, unable to palpate aneurysm due to obesity. MS: no deformity or atrophy Skin: warm and dry, no rash Neuro:  Strength and sensation are intact Psych: euthymic mood, full affect   EKG:  EKG is ordered today. The ekg ordered today demonstrates atrial fibrillation with rate 70 bpm. Nonspecific TWA. Possible old inferior MI. I have personally reviewed and interpreted this study.    Recent Labs: 03/07/2014: ALT 19; BUN 22; Creatinine, Ser 1.38; Hemoglobin 15.2; Platelets 194.0; Potassium 4.1; Sodium 140; TSH 2.01 07/16/2014: Pro B Natriuretic peptide (BNP) 175.0*    Lipid Panel    Component Value Date/Time   CHOL 148 03/07/2014 1038   TRIG 136.0 03/07/2014 1038   HDL 45.70 03/07/2014 1038   CHOLHDL 3 03/07/2014 1038   VLDL 27.2 03/07/2014 1038   LDLCALC 75 03/07/2014 1038      Wt Readings from Last 3 Encounters:  07/18/14 93.441 kg (206 lb)  07/16/14 93.895 kg (207 lb)  06/05/14 91.173 kg (201 lb)       Other studies Reviewed: Additional studies/ records that were reviewed today include: none. Review of the above records demonstrates: N/A   ASSESSMENT AND PLAN:  1.  CAD with remote stenting of the RCA with repeat stenting in 2009 with DES. Myoview 2012 showed no ischemia with basal to mid inferior scar. He is having no significant chest pain. Dyspnea and fatigue may be anginal equivalent symptoms but he reports this hasn't changed for a couple of years. We discussed follow up stress testing but he is really not interested- states he had a bad experience last time with heart racing. We  decided to continue his current medication with statin, metoprolol, and cardizem. If symptoms progress then I think further testing is advisable.   2. Atrial fibrillation. Rate is well controlled. Continue Pradaxa for reduction in stroke risk. Discussed concomitant use of Plavix. Since he has overlapping stents in the RCA I think it is appropriate to continue Plavix as long as he doesn't have any bleeding.   3. AAA 4.2 cm. Followed by serial ultrasound.  4. DM- on oral therapy. Stressed importance of weight loss and regular aerobic activity.  5. HTN controlled.  6. Dyslipidemia. Controlled.    Current medicines are reviewed at length with the patient today.  The patient does not have concerns regarding medicines.  The following changes have been made:  no change  Labs/ tests ordered today include:  Orders Placed This Encounter  Procedures  . EKG 12-Lead     Disposition:   FU with me  in 1 year, if symptoms progress he is to call.  Signed, Peter Martinique, [place]  07/18/2014 3:04 PM    [facility] [address], [place], [place], [address] Phone [phone], Fax [phone]

## 2014-07-26 ENCOUNTER — Other Ambulatory Visit: Payer: Self-pay | Admitting: Internal Medicine

## 2014-08-13 DIAGNOSIS — H26493 Other secondary cataract, bilateral: Secondary | ICD-10-CM | POA: Diagnosis not present

## 2014-08-13 DIAGNOSIS — H34831 Tributary (branch) retinal vein occlusion, right eye: Secondary | ICD-10-CM | POA: Diagnosis not present

## 2014-08-20 DIAGNOSIS — H04123 Dry eye syndrome of bilateral lacrimal glands: Secondary | ICD-10-CM | POA: Diagnosis not present

## 2014-09-07 ENCOUNTER — Ambulatory Visit: Payer: Medicare Other | Admitting: Internal Medicine

## 2014-09-07 DIAGNOSIS — H2513 Age-related nuclear cataract, bilateral: Secondary | ICD-10-CM | POA: Diagnosis not present

## 2014-09-20 ENCOUNTER — Encounter: Payer: Self-pay | Admitting: Internal Medicine

## 2014-09-26 ENCOUNTER — Encounter (INDEPENDENT_AMBULATORY_CARE_PROVIDER_SITE_OTHER): Payer: Self-pay | Admitting: Ophthalmology

## 2014-09-28 ENCOUNTER — Encounter (INDEPENDENT_AMBULATORY_CARE_PROVIDER_SITE_OTHER): Payer: Medicare Other | Admitting: Ophthalmology

## 2014-09-28 DIAGNOSIS — H34833 Tributary (branch) retinal vein occlusion, bilateral: Secondary | ICD-10-CM

## 2014-09-28 DIAGNOSIS — E11319 Type 2 diabetes mellitus with unspecified diabetic retinopathy without macular edema: Secondary | ICD-10-CM | POA: Diagnosis not present

## 2014-09-28 DIAGNOSIS — I10 Essential (primary) hypertension: Secondary | ICD-10-CM | POA: Diagnosis not present

## 2014-09-28 DIAGNOSIS — H43813 Vitreous degeneration, bilateral: Secondary | ICD-10-CM

## 2014-09-28 DIAGNOSIS — E11329 Type 2 diabetes mellitus with mild nonproliferative diabetic retinopathy without macular edema: Secondary | ICD-10-CM | POA: Diagnosis not present

## 2014-09-28 DIAGNOSIS — H35033 Hypertensive retinopathy, bilateral: Secondary | ICD-10-CM | POA: Diagnosis not present

## 2014-10-15 ENCOUNTER — Other Ambulatory Visit (INDEPENDENT_AMBULATORY_CARE_PROVIDER_SITE_OTHER): Payer: Medicare Other | Admitting: Ophthalmology

## 2014-10-15 DIAGNOSIS — H34831 Tributary (branch) retinal vein occlusion, right eye: Secondary | ICD-10-CM | POA: Diagnosis not present

## 2014-10-16 ENCOUNTER — Ambulatory Visit: Payer: Medicare Other | Admitting: Internal Medicine

## 2014-10-20 ENCOUNTER — Other Ambulatory Visit: Payer: Self-pay | Admitting: Internal Medicine

## 2014-10-22 ENCOUNTER — Ambulatory Visit (INDEPENDENT_AMBULATORY_CARE_PROVIDER_SITE_OTHER): Payer: Medicare Other | Admitting: Internal Medicine

## 2014-10-22 ENCOUNTER — Encounter: Payer: Self-pay | Admitting: Internal Medicine

## 2014-10-22 VITALS — BP 130/80 | HR 72 | Temp 97.6°F | Resp 20 | Ht 68.0 in | Wt 205.0 lb

## 2014-10-22 DIAGNOSIS — E785 Hyperlipidemia, unspecified: Secondary | ICD-10-CM

## 2014-10-22 DIAGNOSIS — I10 Essential (primary) hypertension: Secondary | ICD-10-CM | POA: Diagnosis not present

## 2014-10-22 DIAGNOSIS — E1151 Type 2 diabetes mellitus with diabetic peripheral angiopathy without gangrene: Secondary | ICD-10-CM

## 2014-10-22 DIAGNOSIS — E1159 Type 2 diabetes mellitus with other circulatory complications: Secondary | ICD-10-CM | POA: Diagnosis not present

## 2014-10-22 DIAGNOSIS — I714 Abdominal aortic aneurysm, without rupture, unspecified: Secondary | ICD-10-CM

## 2014-10-22 DIAGNOSIS — I480 Paroxysmal atrial fibrillation: Secondary | ICD-10-CM

## 2014-10-22 DIAGNOSIS — I252 Old myocardial infarction: Secondary | ICD-10-CM

## 2014-10-22 LAB — HEMOGLOBIN A1C: Hgb A1c MFr Bld: 7.1 % — ABNORMAL HIGH (ref 4.6–6.5)

## 2014-10-22 MED ORDER — LIRAGLUTIDE 18 MG/3ML ~~LOC~~ SOPN: 1.8000 mg | PEN_INJECTOR | Freq: Every day | SUBCUTANEOUS | Status: DC

## 2014-10-22 NOTE — Progress Notes (Signed)
Subjective:    Patient ID: Jason Fisher, male    DOB: 12-Sep-1940, 74 y.o.   MRN: [id number]  HPI  Lab Results  Component Value Date   HGBA1C 7.7* 06/05/2014    Wt Readings from Last 3 Encounters:  10/22/14 205 lb (92.987 kg)  07/18/14 206 lb (93.441 kg)  07/16/14 207 lb (93.44 kg)   74 year old patient who is seen today for follow-up of diabetes.  Since his last visit here, he is required surgery for a diabetic eye disease.  He is followed now by Dr. Zigmund Daniel for his diabetic retinopathy He has hypertension and coronary artery disease. He states that he has been totally off tobacco products now for 6 months Remains inactive without significant weight loss.  Medical regimen includes Victoza at a submaximal dose of 1.2 milligrams daily  Past Medical History  Diagnosis Date  . 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 Atrial fibrillation 11/06/2009  . BENIGN PROSTATIC HYPERTROPHY, HX OF 02/24/2007  . 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 COLONIC POLYPS, HX OF 11/02/2006  . CORONARY ARTERY DISEASE 11/02/2006  . DIABETES MELLITUS, TYPE II 10/29/2006  . DYSPNEA ON EXERTION 07/07/2008  . HYPERLIPIDEMIA 10/29/2006  . HYPERTENSION 10/29/2006  . MYOCARDIAL INFARCTION, HX OF 11/02/2006  . TOBACCO USER 04/17/2010    Social History   Social History  . Marital Status: Married    Spouse Name: N/A  . Number of Children: 5  . Years of Education: N/A   Occupational History  . [employer]    Social History Main Topics  . Smoking status: Former Smoker    Quit date: 08/16/2013  . Smokeless tobacco: Never Used     Comment: passive smoker as well  . Alcohol Use: 0.0 oz/week    0 Standard drinks or equivalent per week  . Drug Use: No  . Sexual Activity: Not on file   Other Topics Concern  . Not on file   Social History Narrative    Past Surgical History  Procedure Laterality Date  . Cataract extraction    . Coronary angioplasty with stent placement    . Shoulder surgery     right    Family History  Problem Relation Age of Onset  . Heart attack Mother   . Heart attack Father   . Hypertension Father     No Known Allergies  Current Outpatient Prescriptions on File Prior to Visit  Medication Sig Dispense Refill  . atorvastatin (LIPITOR) 20 MG tablet Take 1 tablet (20 mg total) by mouth daily. 90 tablet 1  . chlorthalidone (HYGROTON) 25 MG tablet Take 1 tablet (25 mg total) by mouth daily. 90 tablet 1  . chlorthalidone (HYGROTON) 25 MG tablet TAKE 1 TABLET BY MOUTH DAILY 90 tablet 1  . clopidogrel (PLAVIX) 75 MG tablet Take 1 tablet (75 mg total) by mouth daily. 90 tablet 1  . clotrimazole (LOTRIMIN) 1 % cream Apply 1 application topically 2 (two) times daily. 30 g 2  . dabigatran (PRADAXA) 150 MG CAPS capsule TAKE ONE CAPSULE BY MOUTH EVERY 12 HOURS 60 capsule 3  . diltiazem (CARDIZEM CD) 120 MG 24 hr capsule Take 1 capsule (120 mg total) by mouth daily. 90 capsule 1  . glimepiride (AMARYL) 4 MG tablet TAKE 1 TABLET EVERY MORNING BEFORE BREAKFAST 90 tablet 1  . glucose blood (ACCU-CHEK AVIVA PLUS) test strip CHECK BLOOD SUGAR ONCE DAILY PRN 100 each 3  . HYDROcodone-acetaminophen (NORCO/VICODIN) 5-325 MG per tablet Take 1 tablet by  mouth every 6 (six) hours as needed for moderate pain. 30 tablet 0  . Insulin Pen Needle 29G X 12.7MM MISC 1 each by Does not apply route 1 day or 1 dose. 100 each 3  . Lancets (ACCU-CHEK MULTICLIX) lancets Once daily 250.00 100 each 3  . metFORMIN (GLUCOPHAGE) 1000 MG tablet TAKE 1 TABLET TWICE DAILY WITH A MEAL 180 tablet 1  . metoprolol (LOPRESSOR) 100 MG tablet Take 1 tablet (100 mg total) by mouth 2 (two) times daily. 180 tablet 1  . NITROSTAT 0.4 MG SL tablet PLACE 1 TABLET UNDER TONGUE EVERY 5 MINUTES AS NEEDED 25 tablet 0  . pioglitazone (ACTOS) 45 MG tablet Take 1 tablet (45 mg total) by mouth daily. 90 tablet 3  . ramipril (ALTACE) 10 MG capsule Take 1 capsule (10 mg total) by mouth daily. 90 capsule 1  . sildenafil  (VIAGRA) 100 MG tablet Take 0.5-1 tablets (50-100 mg total) by mouth daily as needed for erectile dysfunction. 5 tablet 11   No current facility-administered medications on file prior to visit.    BP 130/80 mmHg  Pulse 72  Temp(Src) 97.6 F (36.4 C) (Oral)  Resp 20  Ht 5\' 8"  (1.727 m)  Wt 205 lb (92.987 kg)  BMI 31.18 kg/m2  SpO2 97%     Review of Systems  Constitutional: Negative for fever, chills, appetite change and fatigue.  HENT: Negative for congestion, dental problem, ear pain, hearing loss, sore throat, tinnitus, trouble swallowing and voice change.   Eyes: Negative for pain, discharge and visual disturbance.  Respiratory: Negative for cough, chest tightness, wheezing and stridor.   Cardiovascular: Negative for chest pain, palpitations and leg swelling.  Gastrointestinal: Negative for nausea, vomiting, abdominal pain, diarrhea, constipation, blood in stool and abdominal distention.  Genitourinary: Negative for urgency, hematuria, flank pain, discharge, difficulty urinating and genital sores.  Musculoskeletal: Negative for myalgias, back pain, joint swelling, arthralgias, gait problem and neck stiffness.  Skin: Negative for rash.  Neurological: Negative for dizziness, syncope, speech difficulty, weakness, numbness and headaches.  Hematological: Negative for adenopathy. Does not bruise/bleed easily.  Psychiatric/Behavioral: Negative for behavioral problems and dysphoric mood. The patient is not nervous/anxious.        Objective:   Physical Exam  Constitutional: He is oriented to person, place, and time. He appears well-developed.  Blood pressure 130/80  HENT:  Head: Normocephalic.  Right Ear: External ear normal.  Left Ear: External ear normal.  Eyes: Conjunctivae and EOM are normal.  Neck: Normal range of motion.  Cardiovascular: Normal rate and normal heart sounds.   Pulmonary/Chest: Breath sounds normal.  Abdominal: Bowel sounds are normal.  Musculoskeletal:  Normal range of motion. He exhibits no edema or tenderness.  Neurological: He is alert and oriented to person, place, and time.  Psychiatric: He has a normal mood and affect. His behavior is normal.          Assessment & Plan:   Diabetes mellitus.  Will check a hemoglobin A1c.  We'll uptitrate Victoza to 1.8 milligrams daily Dyslipidemia.  Continue statin therapy Hypertension, stable Coronary artery disease.  Asymptomatic History tobacco use.  Abstinent for 6 months  Recheck 3 months

## 2014-10-22 NOTE — Progress Notes (Signed)
Pre visit review using our clinic review tool, if applicable. No additional management support is needed unless otherwise documented below in the visit note. 

## 2014-10-22 NOTE — Patient Instructions (Signed)
Please check your hemoglobin A1c every 3 months  Limit your sodium (Salt) intake  Please check your blood pressure on a regular basis.  If it is consistently greater than 150/90, please make an office appointment.  You need to lose weight.  Consider a lower calorie diet and regular exercise.    Increase Victoza to 1.8 milligrams daily

## 2014-10-29 ENCOUNTER — Ambulatory Visit (INDEPENDENT_AMBULATORY_CARE_PROVIDER_SITE_OTHER): Payer: Medicare Other | Admitting: Ophthalmology

## 2014-10-29 DIAGNOSIS — H34833 Tributary (branch) retinal vein occlusion, bilateral: Secondary | ICD-10-CM

## 2014-12-19 ENCOUNTER — Telehealth: Payer: Self-pay | Admitting: *Deleted

## 2014-12-19 DIAGNOSIS — K921 Melena: Secondary | ICD-10-CM

## 2014-12-19 NOTE — Telephone Encounter (Signed)
Spoke to pt told him received your Colorectal test and Dr. Sherren Mocha reviewed it for Dr.K and it was positive for blood in your stool. Need to see GI for Colonoscopy. Pt verbalized understanding. Told pt will send referral to [facility] and someone will contact you for an appt. Pt verbalized understanding.

## 2014-12-20 ENCOUNTER — Encounter: Payer: Self-pay | Admitting: Internal Medicine

## 2015-01-08 ENCOUNTER — Telehealth: Payer: Self-pay | Admitting: Internal Medicine

## 2015-01-08 NOTE — Telephone Encounter (Signed)
Spoke to pt, told him I do have sample of Victoza can come by office to pickup. Pt verbalized understanding.

## 2015-01-08 NOTE — Telephone Encounter (Signed)
Pt call to ask for a sample of Liraglutide (VICTOZA) 18 MG/3ML SOPN

## 2015-01-21 ENCOUNTER — Ambulatory Visit (INDEPENDENT_AMBULATORY_CARE_PROVIDER_SITE_OTHER): Payer: Medicare Other | Admitting: Internal Medicine

## 2015-01-21 ENCOUNTER — Encounter: Payer: Self-pay | Admitting: Internal Medicine

## 2015-01-21 VITALS — BP 128/76 | HR 74 | Temp 97.6°F | Resp 20 | Ht 68.0 in | Wt 199.0 lb

## 2015-01-21 DIAGNOSIS — I10 Essential (primary) hypertension: Secondary | ICD-10-CM

## 2015-01-21 DIAGNOSIS — I714 Abdominal aortic aneurysm, without rupture, unspecified: Secondary | ICD-10-CM

## 2015-01-21 DIAGNOSIS — I480 Paroxysmal atrial fibrillation: Secondary | ICD-10-CM

## 2015-01-21 DIAGNOSIS — Z23 Encounter for immunization: Secondary | ICD-10-CM

## 2015-01-21 DIAGNOSIS — E785 Hyperlipidemia, unspecified: Secondary | ICD-10-CM | POA: Diagnosis not present

## 2015-01-21 DIAGNOSIS — I2510 Atherosclerotic heart disease of native coronary artery without angina pectoris: Secondary | ICD-10-CM

## 2015-01-21 DIAGNOSIS — E1151 Type 2 diabetes mellitus with diabetic peripheral angiopathy without gangrene: Secondary | ICD-10-CM | POA: Diagnosis not present

## 2015-01-21 MED ORDER — HYDROCODONE-ACETAMINOPHEN 5-325 MG PO TABS: 1.0000 | ORAL_TABLET | Freq: Four times a day (QID) | ORAL | Status: DC | PRN

## 2015-01-21 MED ORDER — APIXABAN 5 MG PO TABS: 5.0000 mg | ORAL_TABLET | Freq: Two times a day (BID) | ORAL | Status: DC

## 2015-01-21 NOTE — Progress Notes (Signed)
Subjective:    Patient ID: Jason Fisher, male    DOB: [birth year], 74 y.o.   MRN: [id number]  HPI  Lab Results  Component Value Date   HGBA1C 7.1* 10/22/2014    BP Readings from Last 3 Encounters:  01/21/15 128/76  10/22/14 130/80  07/18/14 100/60    Wt Readings from Last 3 Encounters:  01/21/15 199 lb (90.266 kg)  10/22/14 205 lb (92.987 kg)  07/18/14 206 lb (93.55 kg)   74 year old patient who is seen today for his quarterly follow-up.  Medical issues include coronary artery disease which has been stable.  He does have a history of an abdominal aortic aneurysm and he states he is scheduled for follow-up ultrasound next month.  He has had a recent eye examination and is scheduled to see Dr. Zigmund Daniel also next month.  He has had some mild URI symptoms with persistent cough, but otherwise doing quite well.  No cardiopulmonary complaints.  His co-pay has changed on Pradaxa and he is requesting a switch to a alternate anticoagulant.  Past Medical History  Diagnosis Date  . 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 Atrial fibrillation ([place]) 11/06/2009  . BENIGN PROSTATIC HYPERTROPHY, HX OF 02/24/2007  . 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 COLONIC POLYPS, HX OF 11/02/2006  . CORONARY ARTERY DISEASE 11/02/2006  . DIABETES MELLITUS, TYPE II 10/29/2006  . DYSPNEA ON EXERTION 07/07/2008  . HYPERLIPIDEMIA 10/29/2006  . HYPERTENSION 10/29/2006  . MYOCARDIAL INFARCTION, HX OF 11/02/2006  . TOBACCO USER 04/17/2010    Social History   Social History  . Marital Status: Married    Spouse Name: N/A  . Number of Children: 5  . Years of Education: N/A   Occupational History  . [employer]    Social History Main Topics  . Smoking status: Former Smoker    Quit date: 08/16/2013  . Smokeless tobacco: Never Used     Comment: passive smoker as well  . Alcohol Use: 0.0 oz/week    0 Standard drinks or equivalent per week  . Drug Use: No  . Sexual Activity: Not on file   Other Topics  Concern  . Not on file   Social History Narrative    Past Surgical History  Procedure Laterality Date  . Cataract extraction    . Coronary angioplasty with stent placement    . Shoulder surgery      right    Family History  Problem Relation Age of Onset  . Heart attack Mother   . Heart attack Father   . Hypertension Father     No Known Allergies  Current Outpatient Prescriptions on File Prior to Visit  Medication Sig Dispense Refill  . atorvastatin (LIPITOR) 20 MG tablet Take 1 tablet (20 mg total) by mouth daily. 90 tablet 1  . chlorthalidone (HYGROTON) 25 MG tablet Take 1 tablet (25 mg total) by mouth daily. 90 tablet 1  . clopidogrel (PLAVIX) 75 MG tablet Take 1 tablet (75 mg total) by mouth daily. 90 tablet 1  . clotrimazole (LOTRIMIN) 1 % cream Apply 1 application topically 2 (two) times daily. 30 g 2  . dabigatran (PRADAXA) 150 MG CAPS capsule TAKE ONE CAPSULE BY MOUTH EVERY 12 HOURS 60 capsule 3  . diltiazem (CARDIZEM CD) 120 MG 24 hr capsule Take 1 capsule (120 mg total) by mouth daily. 90 capsule 1  . glimepiride (AMARYL) 4 MG tablet TAKE 1 TABLET EVERY MORNING BEFORE BREAKFAST 90 tablet 1  . glucose blood (  ACCU-CHEK AVIVA PLUS) test strip CHECK BLOOD SUGAR ONCE DAILY PRN 100 each 3  . Insulin Pen Needle 29G X 12.7MM MISC 1 each by Does not apply route 1 day or 1 dose. 100 each 3  . Lancets (ACCU-CHEK MULTICLIX) lancets Once daily 250.00 100 each 3  . Liraglutide (VICTOZA) 18 MG/3ML SOPN Inject 0.3 mLs (1.8 mg total) into the skin daily. 9 pen 1  . metFORMIN (GLUCOPHAGE) 1000 MG tablet TAKE 1 TABLET TWICE DAILY WITH A MEAL 180 tablet 1  . metoprolol (LOPRESSOR) 100 MG tablet Take 1 tablet (100 mg total) by mouth 2 (two) times daily. 180 tablet 1  . NITROSTAT 0.4 MG SL tablet PLACE 1 TABLET UNDER TONGUE EVERY 5 MINUTES AS NEEDED 25 tablet 0  . pioglitazone (ACTOS) 45 MG tablet Take 1 tablet (45 mg total) by mouth daily. 90 tablet 3  . ramipril (ALTACE) 10 MG capsule  Take 1 capsule (10 mg total) by mouth daily. 90 capsule 1  . sildenafil (VIAGRA) 100 MG tablet Take 0.5-1 tablets (50-100 mg total) by mouth daily as needed for erectile dysfunction. 5 tablet 11   No current facility-administered medications on file prior to visit.    BP 128/76 mmHg  Pulse 74  Temp(Src) 97.6 F (36.4 C) (Oral)  Resp 20  Ht 5\' 8"  (1.727 m)  Wt 199 lb (90.266 kg)  BMI 30.26 kg/m2  SpO2 97%      Review of Systems  Constitutional: Negative for fever, chills, appetite change and fatigue.  HENT: Negative for congestion, dental problem, ear pain, hearing loss, sore throat, tinnitus, trouble swallowing and voice change.   Eyes: Negative for pain, discharge and visual disturbance.  Respiratory: Positive for cough. Negative for chest tightness, wheezing and stridor.   Cardiovascular: Negative for chest pain, palpitations and leg swelling.  Gastrointestinal: Negative for nausea, vomiting, abdominal pain, diarrhea, constipation, blood in stool and abdominal distention.  Genitourinary: Negative for urgency, hematuria, flank pain, discharge, difficulty urinating and genital sores.  Musculoskeletal: Negative for myalgias, back pain, joint swelling, arthralgias, gait problem and neck stiffness.  Skin: Negative for rash.  Neurological: Negative for dizziness, syncope, speech difficulty, weakness, numbness and headaches.  Hematological: Negative for adenopathy. Does not bruise/bleed easily.  Psychiatric/Behavioral: Negative for behavioral problems and dysphoric mood. The patient is not nervous/anxious.        Objective:   Physical Exam  Constitutional: He is oriented to person, place, and time. He appears well-developed.  HENT:  Head: Normocephalic.  Right Ear: External ear normal.  Left Ear: External ear normal.  Eyes: Conjunctivae and EOM are normal.  Neck: Normal range of motion.  Cardiovascular: Normal rate and normal heart sounds.   Pedal pulses not easily palpable    Pulmonary/Chest: No respiratory distress. He has no wheezes.  Rare basilar crackles  Abdominal: Bowel sounds are normal.  Musculoskeletal: Normal range of motion. He exhibits no edema or tenderness.  Neurological: He is alert and oriented to person, place, and time.  Psychiatric: He has a normal mood and affect. His behavior is normal.          Assessment & Plan:   Diabetes mellitus.  Will check a hemoglobin A1c.  Additional weight loss encouraged Coronary artery disease AAA.  Follow-up abdominal ultrasound Hypertension, well-controlled Dyslipidemia.  Continue statin therapy Paroxysmal atrial fibrillation.  Continue anticoagulation

## 2015-01-21 NOTE — Patient Instructions (Addendum)
Please check your hemoglobin A1c every 3 months  Limit your sodium (Salt) intake    It is important that you exercise regularly, at least 20 minutes 3 to 4 times per week.  If you develop chest pain or shortness of breath seek  medical attention.  You need to lose weight.  Consider a lower calorie diet and regular exercise.  Please see your eye doctor yearly to check for diabetic eye damage  Schedule your colonoscopy to help detect colon cancer.  Please schedule your follow-up abdominal ultrasound

## 2015-01-21 NOTE — Progress Notes (Signed)
Pre visit review using our clinic review tool, if applicable. No additional management support is needed unless otherwise documented below in the visit note. 

## 2015-01-31 ENCOUNTER — Ambulatory Visit (HOSPITAL_COMMUNITY)
Admission: RE | Admit: 2015-01-31 | Discharge: 2015-01-31 | Disposition: A | Discharge status: Home / Self Care | Payer: Medicare Other | Source: Ambulatory Visit | Attending: Cardiovascular Disease | Admitting: Cardiovascular Disease

## 2015-01-31 DIAGNOSIS — E119 Type 2 diabetes mellitus without complications: Secondary | ICD-10-CM | POA: Insufficient documentation

## 2015-01-31 DIAGNOSIS — I714 Abdominal aortic aneurysm, without rupture, unspecified: Secondary | ICD-10-CM

## 2015-01-31 DIAGNOSIS — E785 Hyperlipidemia, unspecified: Secondary | ICD-10-CM | POA: Diagnosis not present

## 2015-01-31 DIAGNOSIS — I700 Atherosclerosis of aorta: Secondary | ICD-10-CM | POA: Diagnosis not present

## 2015-01-31 DIAGNOSIS — I10 Essential (primary) hypertension: Secondary | ICD-10-CM | POA: Insufficient documentation

## 2015-02-04 ENCOUNTER — Other Ambulatory Visit: Payer: Self-pay | Admitting: Internal Medicine

## 2015-02-25 ENCOUNTER — Encounter: Payer: Self-pay | Admitting: Internal Medicine

## 2015-02-25 ENCOUNTER — Ambulatory Visit (INDEPENDENT_AMBULATORY_CARE_PROVIDER_SITE_OTHER): Payer: Medicare Other | Admitting: Internal Medicine

## 2015-02-25 ENCOUNTER — Telehealth: Payer: Self-pay

## 2015-02-25 VITALS — BP 144/92 | HR 80 | Ht 68.5 in | Wt 203.1 lb

## 2015-02-25 DIAGNOSIS — Z7901 Long term (current) use of anticoagulants: Secondary | ICD-10-CM

## 2015-02-25 DIAGNOSIS — R195 Other fecal abnormalities: Secondary | ICD-10-CM | POA: Diagnosis not present

## 2015-02-25 DIAGNOSIS — Z860101 Personal history of adenomatous and serrated colon polyps: Secondary | ICD-10-CM

## 2015-02-25 DIAGNOSIS — I482 Chronic atrial fibrillation, unspecified: Secondary | ICD-10-CM

## 2015-02-25 DIAGNOSIS — Z86010 Personal history of colonic polyps: Secondary | ICD-10-CM | POA: Diagnosis not present

## 2015-02-25 DIAGNOSIS — I2510 Atherosclerotic heart disease of native coronary artery without angina pectoris: Secondary | ICD-10-CM

## 2015-02-25 DIAGNOSIS — Z9861 Coronary angioplasty status: Secondary | ICD-10-CM

## 2015-02-25 DIAGNOSIS — Z7902 Long term (current) use of antithrombotics/antiplatelets: Secondary | ICD-10-CM

## 2015-02-25 NOTE — Progress Notes (Signed)
   Subjective:    Patient ID: Jason Fisher, male    DOB: [birth year], 75 y.o.   MRN: [id number]  CC: heme positive stool   HPI Jason Fisher is a 75 yo male who presents today with a heme positive stool and a history of adenomatous colon polyps. His FIOB was positive in 10/16. The last colonoscopy completed was in 2005 by Dr. Carlean Purl where 3 adenomatous polyps were removed. Patient denies melena, bright red blood per rectum, abdominal pain, weight loss, N/V, constipation, and diarrhea. Patient is on Plavix for CAD and will be switching to Eliquis next week for chronic A. Fib.   Medications, allergies, past medical history, past surgical history, family history and social history are reviewed and updated in the EMR.   Review of Systems  Constitutional: Negative for appetite change and unexpected weight change.  Respiratory: Positive for cough.   Cardiovascular: Negative for chest pain.  Gastrointestinal: Negative for nausea, vomiting, abdominal pain, diarrhea, constipation, blood in stool and anal bleeding.  All other systems reviewed and are negative.      Objective:   Physical Exam  Constitutional: He is oriented to person, place, and time. He appears well-developed.  HENT:  Head: Normocephalic and atraumatic.  Mouth/Throat: Oropharynx is clear and moist.  Eyes: No scleral icterus.  Neck: No tracheal deviation present.  Cardiovascular: Normal rate and regular rhythm.  Exam reveals no gallop and no friction rub.   No murmur heard. Distant heart sounds   Pulmonary/Chest: Effort normal. No respiratory distress. He has no wheezes. He has no rales. He exhibits no tenderness.  Increased AP diameter  Distant and decreased breath sounds  Abdominal: Soft. Bowel sounds are normal. He exhibits no mass. There is no tenderness. There is no rebound and no guarding.  Neurological: He is alert and oriented to person, place, and time.  Skin: Skin is warm and dry.  Venous stasis changes on B/L  lower extremities  L shin: superficial ulcer   Psychiatric: He has a normal mood and affect. His behavior is normal. Judgment and thought content normal.    Lab Results  Component Value Date   WBC 8.4 03/07/2014   HGB 15.2 03/07/2014   HCT 44.9 03/07/2014   MCV 86.2 03/07/2014   PLT 194.0 03/07/2014    Have reviewed cardiology note from 2016 as well. Primary care notes also.     Assessment & Plan:  Heme + stool  Hx of adenomatous colonic polyps  Chronic atrial fibrillation (HCC)  CAD S/P percutaneous coronary angioplasty  Long term current use of antithrombotics/antiplatelets  Long term current use of anticoagulant therapy  Due to heme + stool, history of adenomatous colon polyps, and the length of time since the last colonoscopy, it is recommended that the patient undergoes a colonoscopy.   His Eliquis and Plavix will need to be held before the procedure. Will talk to Dr. Martinique about holding these medications prior to the colonoscopy.   The risk and benefits of the procedure were explained to the patient and family member. He was agreeable to undergo a colonoscopy.  Also understands the risk of a possible vascular event including stroke or heart attack while holding antiplatelet and anticoagulants.  As patient was seen in conjunction with Foye Spurling PA student, who served as a Education administrator for part of this note, I have taken the history and perform the exam myself as documented above.  I appreciate the opportunity to care for this patient. CC: Nyoka Cowden, MD

## 2015-02-25 NOTE — Telephone Encounter (Signed)
  RE: Jason Fisher DOB: 05-17-1940 MRN: [id number]   Dear Peter Martinique MD,    We have scheduled the above patient for an endoscopic procedure. Our records show that he is on anticoagulation therapy.   Please advise as to how long the patient may come off his therapy of Plavix and Eliquis prior to the colonoscopy procedure, which is scheduled for 04/19/15.  Please fax back/ or route the completed form to [name], [place] at [phone].   Sincerely,   Silvano Rusk, MD, [facility]

## 2015-02-25 NOTE — Patient Instructions (Signed)
  You have been scheduled for a colonoscopy. Please follow written instructions given to you at your visit today.  Please pick up your prep supplies at the pharmacy within the next 1-3 days. If you use inhalers (even only as needed), please bring them with you on the day of your procedure.  You will be contaced by our office prior to your procedure for directions on holding your blood thinners.  If you do not hear from our office 1 week prior to your scheduled procedure, please call [phone] to discuss.   I appreciate the opportunity to care for you. Silvano Rusk, MD, [facility]

## 2015-02-26 ENCOUNTER — Telehealth: Payer: Self-pay | Admitting: Internal Medicine

## 2015-02-26 NOTE — Telephone Encounter (Signed)
Pt call to ask why a fluid pill was in called to his pharmacy. Pt said it was not mention to him at his last visit that he was going to be taking a fluid pill. Would like a call back to discuss. Pt did not pick up the med and did not know the name of medicine.

## 2015-02-26 NOTE — Telephone Encounter (Signed)
Left message to call me back.

## 2015-02-26 NOTE — Telephone Encounter (Signed)
Spoke to pt, told him I did not send anything new to the pharmacy. Pt said he got it straightened out with the pharmacy it was a mistake. Told him okay.

## 2015-02-26 NOTE — Telephone Encounter (Signed)
He may stop Plavix 7 days before and Eliquis 3 days before colonoscopy.  Tavi Hoogendoorn Martinique MD, [facility]

## 2015-02-28 ENCOUNTER — Ambulatory Visit (INDEPENDENT_AMBULATORY_CARE_PROVIDER_SITE_OTHER): Payer: Medicare Other | Admitting: Ophthalmology

## 2015-02-28 DIAGNOSIS — E11319 Type 2 diabetes mellitus with unspecified diabetic retinopathy without macular edema: Secondary | ICD-10-CM | POA: Diagnosis not present

## 2015-02-28 DIAGNOSIS — E113293 Type 2 diabetes mellitus with mild nonproliferative diabetic retinopathy without macular edema, bilateral: Secondary | ICD-10-CM | POA: Diagnosis not present

## 2015-02-28 DIAGNOSIS — I10 Essential (primary) hypertension: Secondary | ICD-10-CM | POA: Diagnosis not present

## 2015-02-28 DIAGNOSIS — H43813 Vitreous degeneration, bilateral: Secondary | ICD-10-CM

## 2015-02-28 DIAGNOSIS — H35033 Hypertensive retinopathy, bilateral: Secondary | ICD-10-CM

## 2015-02-28 DIAGNOSIS — H348332 Tributary (branch) retinal vein occlusion, bilateral, stable: Secondary | ICD-10-CM | POA: Diagnosis not present

## 2015-03-01 NOTE — Telephone Encounter (Signed)
Informed Orpheus of when to hold blood thinners and he verbalized understanding.

## 2015-03-12 ENCOUNTER — Other Ambulatory Visit: Payer: Self-pay | Admitting: Internal Medicine

## 2015-03-27 ENCOUNTER — Other Ambulatory Visit: Payer: Self-pay | Admitting: Internal Medicine

## 2015-04-19 ENCOUNTER — Ambulatory Visit (AMBULATORY_SURGERY_CENTER): Payer: Medicare Other | Admitting: Internal Medicine

## 2015-04-19 ENCOUNTER — Encounter: Payer: Self-pay | Admitting: Internal Medicine

## 2015-04-19 VITALS — BP 126/76 | HR 76 | Temp 97.3°F | Resp 29 | Ht 68.0 in | Wt 203.0 lb

## 2015-04-19 DIAGNOSIS — D122 Benign neoplasm of ascending colon: Secondary | ICD-10-CM | POA: Diagnosis not present

## 2015-04-19 DIAGNOSIS — D123 Benign neoplasm of transverse colon: Secondary | ICD-10-CM

## 2015-04-19 DIAGNOSIS — K5520 Angiodysplasia of colon without hemorrhage: Secondary | ICD-10-CM | POA: Diagnosis not present

## 2015-04-19 DIAGNOSIS — Z86010 Personal history of colonic polyps: Secondary | ICD-10-CM

## 2015-04-19 DIAGNOSIS — D125 Benign neoplasm of sigmoid colon: Secondary | ICD-10-CM

## 2015-04-19 DIAGNOSIS — K5730 Diverticulosis of large intestine without perforation or abscess without bleeding: Secondary | ICD-10-CM

## 2015-04-19 DIAGNOSIS — I4891 Unspecified atrial fibrillation: Secondary | ICD-10-CM | POA: Diagnosis not present

## 2015-04-19 LAB — GLUCOSE, CAPILLARY
GLUCOSE-CAPILLARY: 113 mg/dL — AB (ref 65–99)
GLUCOSE-CAPILLARY: 67 mg/dL (ref 65–99)
Glucose-Capillary: 71 mg/dL (ref 65–99)
Glucose-Capillary: 79 mg/dL (ref 65–99)

## 2015-04-19 MED ORDER — SODIUM CHLORIDE 0.9 % IV SOLN: 500.0000 mL | INTRAVENOUS | Status: DC

## 2015-04-19 NOTE — Progress Notes (Signed)
Called to room to assist during endoscopic procedure.  Patient ID and intended procedure confirmed with present staff. Received instructions for my participation in the procedure from the performing physician.  

## 2015-04-19 NOTE — Progress Notes (Signed)
To recovery, report to Smith, RN, VSS 

## 2015-04-19 NOTE — Patient Instructions (Addendum)
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  I will let you know pathology results and when/if to have another routine colonoscopy by mail. Gatha Mayer, MD, FACG YOU HAD AN ENDOSCOPIC PROCEDURE TODAY AT [facility]:   Refer to the procedure report that was given to you for any specific questions about what was found during the examination.  If the procedure report does not answer your questions, please call your gastroenterologist to clarify.  If you requested that your care partner not be given the details of your procedure findings, then the procedure report has been included in a sealed envelope for you to review at your convenience later.  YOU SHOULD EXPECT: Some feelings of bloating in the abdomen. Passage of more gas than usual.  Walking can help get rid of the air that was put into your GI tract during the procedure and reduce the bloating. If you had a lower endoscopy (such as a colonoscopy or flexible sigmoidoscopy) you may notice spotting of blood in your stool or on the toilet paper. If you underwent a bowel prep for your procedure, you may not have a normal bowel movement for a few days.  Please Note:  You might notice some irritation and congestion in your nose or some drainage.  This is from the oxygen used during your procedure.  There is no need for concern and it should clear up in a day or so.  SYMPTOMS TO REPORT IMMEDIATELY:   Following lower endoscopy (colonoscopy or flexible sigmoidoscopy):  Excessive amounts of blood in the stool  Significant tenderness or worsening of abdominal pains  Swelling of the abdomen that is new, acute  Fever of 100F or higher    For urgent or emergent issues, a gastroenterologist can be reached at any hour by calling [phone].   DIET: Your first meal  following the procedure should be a small meal and then it is ok to progress to your normal diet. Heavy or fried foods are harder to digest and may make you feel nauseous or bloated.  Likewise, meals heavy in dairy and vegetables can increase bloating.  Drink plenty of fluids but you should avoid alcoholic beverages for 24 hours.  ACTIVITY:  You should plan to take it easy for the rest of today and you should NOT DRIVE or use heavy machinery until tomorrow (because of the sedation medicines used during the test).    FOLLOW UP: Our staff will call the number listed on your records the next business day following your procedure to check on you and address any questions or concerns that you may have regarding the information given to you following your procedure. If we do not reach you, we will leave a message.  However, if you are feeling well and you are not experiencing any problems, there is no need to return our call.  We will assume that you have returned to your regular daily activities without incident.  If any biopsies were taken you will be contacted by phone or by letter within the next 1-3 weeks.  Please call us at [phone] if you have not heard about the biopsies in 3 weeks.    SIGNATURES/CONFIDENTIALITY: You and/or your care partner have signed paperwork which will be entered into your electronic medical record.  These signatures attest to the fact that that the information above on your After Visit Summary has been reviewed and is understood.  Full responsibility of the confidentiality of this discharge information lies with you and/or your care-partner.  Polyp handout given Await pathology results Resume medication and diet

## 2015-04-19 NOTE — Op Note (Signed)
[facility] Patient Name: Jason Fisher Procedure Date: 04/19/2015 1:56 PM MRN: [id number] Endoscopist: Gatha Mayer , MD Age: 75 Referring MD:  Date of Birth: [birth year] Gender: Male Procedure:                Colonoscopy Indications:              Surveillance: Personal history of adenomatous                            polyps on last colonoscopy > 5 years ago Medicines:                Propofol total dose 200 mg IV, Monitored Anesthesia                            Care Procedure:                Pre-Anesthesia Assessment:                           - Prior to the procedure, a History and Physical                            was performed, and patient medications and                            allergies were reviewed. The patient's tolerance of                            previous anesthesia was also reviewed. The risks                            and benefits of the procedure and the sedation                            options and risks were discussed with the patient.                            All questions were answered, and informed consent                            was obtained. Prior Anticoagulants: The patient                            last took Eliquis (apixaban) 2 days and Plavix                            (clopidogrel) 5 days prior to the procedure. ASA                            Grade Assessment: III - A patient with severe                            systemic disease. After reviewing the risks and  benefits, the patient was deemed in satisfactory                            condition to undergo the procedure.                           After obtaining informed consent, the colonoscope                            was passed under direct vision. Throughout the                            procedure, the patient's blood pressure, pulse, and                            oxygen saturations were monitored continuously. The   Model CF-HQ190L [id number]) scope was introduced                            through the anus and advanced to the the cecum,                            identified by appendiceal orifice and ileocecal                            valve. The colonoscopy was performed without                            difficulty. The patient tolerated the procedure                            well. The quality of the bowel preparation was                            good. The bowel preparation used was Miralax. The                            ileocecal valve, appendiceal orifice, and rectum                            were photographed. Scope In: 2:06:25 PM Scope Out: 2:26:02 PM Scope Withdrawal Time: 0 hours 15 minutes 29 seconds  Total Procedure Duration: 0 hours 19 minutes 37 seconds  Findings:      The perianal and digital rectal examinations were normal. Pertinent       negatives include normal prostate (size, shape, and consistency).      Six sessile polyps were found in the sigmoid colon, transverse colon and       ascending colon. The polyps were 3 to 7 mm in size. These polyps were       removed with a cold snare. Resection and retrieval were complete.      One small angiodysplastic lesion without bleeding was found in the       proximal ascending colon.      A single small angiodysplastic lesion without bleeding was found in the  cecum.      Multiple diverticula were found in the sigmoid colon.      The exam was otherwise without abnormality on direct and retroflexion       views. Complications:            No immediate complications. Estimated Blood Loss:     Estimated blood loss: none. Impression:               - Six 3 to 7 mm polyps in the sigmoid colon, in the                            transverse colon and in the ascending colon,                            removed with a cold snare. Resected and retrieved.                           - One non-bleeding colonic angiodysplastic lesion.                            - A single non-bleeding colonic angiodysplastic                            lesion.                           - Diverticulosis in the sigmoid colon.                           - The examination was otherwise normal on direct                            and retroflexion views. Recommendation:           - Patient has a contact number available for                            emergencies. The signs and symptoms of potential                            delayed complications were discussed with the                            patient. Return to normal activities tomorrow.                            Written discharge instructions were provided to the                            patient.                           - Resume previous diet.                           - Continue present medications.                           -  Resume Eliquis (apixaban) tomorrow and Plavix                            (clopidogrel) tomorrow at prior doses.                           - Await pathology results.                           - No repeat colonoscopy due to age.                           - And co-morbidities Procedure Code(s):        --- Professional ---                           [phone], Colonoscopy, flexible; with removal of                            tumor(s), polyp(s), or other lesion(s) by snare                            technique CPT copyright 2016 American Medical Association. All rights reserved. Gatha Mayer, MD Gatha Mayer, MD 04/19/2015 2:39:17 PM Number of Addenda: 0 CC Letter to:             Marletta Lor

## 2015-04-22 ENCOUNTER — Other Ambulatory Visit: Payer: Self-pay | Admitting: Internal Medicine

## 2015-04-22 ENCOUNTER — Ambulatory Visit (INDEPENDENT_AMBULATORY_CARE_PROVIDER_SITE_OTHER): Payer: Medicare Other | Admitting: Internal Medicine

## 2015-04-22 ENCOUNTER — Telehealth: Payer: Self-pay | Admitting: *Deleted

## 2015-04-22 ENCOUNTER — Encounter: Payer: Self-pay | Admitting: Internal Medicine

## 2015-04-22 VITALS — BP 132/80 | HR 78 | Temp 97.6°F | Resp 20 | Ht 68.0 in | Wt 205.0 lb

## 2015-04-22 DIAGNOSIS — I10 Essential (primary) hypertension: Secondary | ICD-10-CM

## 2015-04-22 DIAGNOSIS — I481 Persistent atrial fibrillation: Secondary | ICD-10-CM

## 2015-04-22 DIAGNOSIS — E1151 Type 2 diabetes mellitus with diabetic peripheral angiopathy without gangrene: Secondary | ICD-10-CM | POA: Diagnosis not present

## 2015-04-22 DIAGNOSIS — E785 Hyperlipidemia, unspecified: Secondary | ICD-10-CM | POA: Diagnosis not present

## 2015-04-22 DIAGNOSIS — I4819 Other persistent atrial fibrillation: Secondary | ICD-10-CM

## 2015-04-22 LAB — LIPID PANEL
CHOL/HDL RATIO: 3
Cholesterol: 133 mg/dL (ref 0–200)
HDL: 42.4 mg/dL (ref >39.00)
LDL Cholesterol: 64 mg/dL (ref 0–99)
NONHDL: 90.37
Triglycerides: 133 mg/dL (ref 0.0–149.0)
VLDL: 26.6 mg/dL (ref 0.0–40.0)

## 2015-04-22 LAB — MICROALBUMIN / CREATININE URINE RATIO
Creatinine,U: 111.6 mg/dL
Microalb Creat Ratio: 1.9 mg/g (ref 0.0–30.0)
Microalb, Ur: 2.1 mg/dL — ABNORMAL HIGH (ref 0.0–1.9)

## 2015-04-22 LAB — HEMOGLOBIN A1C: HEMOGLOBIN A1C: 6.8 % — AB (ref 4.6–6.5)

## 2015-04-22 NOTE — Telephone Encounter (Signed)
  Follow up Call-  Call back number 04/19/2015  Post procedure Call Back phone  # [phone]  Permission to leave phone message Yes     Patient questions:  Do you have a fever, pain , or abdominal swelling? No. Pain Score  0 *  Have you tolerated food without any problems? Yes.    Have you been able to return to your normal activities? Yes.    Do you have any questions about your discharge instructions: Diet   No. Medications  No. Follow up visit  No.  Do you have questions or concerns about your Care? No.  Actions: * If pain score is 4 or above: No action needed, pain <4.

## 2015-04-22 NOTE — Progress Notes (Signed)
Subjective:    Patient ID: Jason Fisher, male    DOB: 10-29-1940, 75 y.o.   MRN: [id number]  HPI  Lab Results  Component Value Date   HGBA1C 7.1* 10/22/2014   75 year old patient who is seen today for follow-up.  He has a history of type 2 diabetes, essential hypertension and dyslipidemia.  He states his glycemic control has been good. He has had a recent eye examination and follow colonoscopy was performed last week. No concerns or complaints Remains on anticoagulation for atrial fibrillation.  He has coronary artery disease which has been stable. Denies any exertional chest pain or shortness of breath.  Wt Readings from Last 3 Encounters:  04/22/15 205 lb (92.987 kg)  04/19/15 203 lb (92.08 kg)  02/25/15 203 lb 2 oz (92.137 kg)    Past Medical History  Diagnosis Date  . 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 Atrial fibrillation ([place]) 11/06/2009  . BENIGN PROSTATIC HYPERTROPHY, HX OF 02/24/2007  . 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 COLONIC POLYPS, HX OF 11/02/2006  . CORONARY ARTERY DISEASE 11/02/2006  . DIABETES MELLITUS, TYPE II 10/29/2006  . DYSPNEA ON EXERTION 07/07/2008  . HYPERLIPIDEMIA 10/29/2006  . HYPERTENSION 10/29/2006  . MYOCARDIAL INFARCTION, HX OF 11/02/2006  . TOBACCO USER 04/17/2010  . Diverticulosis     Social History   Social History  . Marital Status: Married    Spouse Name: N/A  . Number of Children: 5  . Years of Education: N/A   Occupational History  . [employer]    Social History Main Topics  . Smoking status: Current Some Day Smoker    Types: Cigarettes  . Smokeless tobacco: Never Used     Comment: passive smoker as well  . Alcohol Use: No  . Drug Use: No  . Sexual Activity: Not on file   Other Topics Concern  . Not on file   Social History Narrative    Past Surgical History  Procedure Laterality Date  . Cataract extraction    . Coronary angioplasty with stent placement    . Shoulder surgery Right   . Colonoscopy       Family History  Problem Relation Age of Onset  . Heart attack Mother   . Heart attack Father   . Hypertension Father   . Heart attack Brother   . Diabetes Mother   . Diabetes Brother     No Known Allergies  Current Outpatient Prescriptions on File Prior to Visit  Medication Sig Dispense Refill  . ACCU-CHEK AVIVA PLUS test strip CHECK BLOOD SUGAR ONCE DAILY AS NEEDED 100 each 4  . apixaban (ELIQUIS) 5 MG TABS tablet Take 1 tablet (5 mg total) by mouth 2 (two) times daily. 60 tablet 6  . atorvastatin (LIPITOR) 20 MG tablet TAKE 1 TABLET BY MOUTH EVERY DAY 90 tablet 1  . chlorthalidone (HYGROTON) 25 MG tablet Take 1 tablet (25 mg total) by mouth daily. 90 tablet 1  . clopidogrel (PLAVIX) 75 MG tablet TAKE 1 TABLET BY MOUTH EVERY DAY 90 tablet 1  . clotrimazole (LOTRIMIN) 1 % cream Apply 1 application topically 2 (two) times daily. 30 g 2  . diltiazem (CARDIZEM CD) 120 MG 24 hr capsule Take 1 capsule (120 mg total) by mouth daily. 90 capsule 1  . glimepiride (AMARYL) 4 MG tablet TAKE 1 TABLET BY MOUTH EVERY MORNING BEFORE BREAKFAST 90 tablet 1  . HYDROcodone-acetaminophen (NORCO/VICODIN) 5-325 MG tablet Take 1 tablet by mouth every 6 (six)  hours as needed for moderate pain. 30 tablet 0  . Insulin Pen Needle 29G X 12.7MM MISC 1 each by Does not apply route 1 day or 1 dose. 100 each 3  . Lancets (ACCU-CHEK MULTICLIX) lancets Once daily 250.00 100 each 3  . Liraglutide (VICTOZA) 18 MG/3ML SOPN Inject 0.3 mLs (1.8 mg total) into the skin daily. 9 pen 1  . metFORMIN (GLUCOPHAGE) 1000 MG tablet TAKE 1 TABLET TWICE DAILY WITH A MEAL 180 tablet 1  . metoprolol (LOPRESSOR) 100 MG tablet Take 1 tablet (100 mg total) by mouth 2 (two) times daily. 180 tablet 1  . NITROSTAT 0.4 MG SL tablet PLACE 1 TABLET UNDER TONGUE EVERY 5 MINUTES AS NEEDED 25 tablet 0  . pioglitazone (ACTOS) 45 MG tablet Take 1 tablet (45 mg total) by mouth daily. 90 tablet 3  . ramipril (ALTACE) 10 MG capsule TAKE ONE  CAPSULE BY MOUTH EVERY DAY 90 capsule 1  . sildenafil (VIAGRA) 100 MG tablet Take 0.5-1 tablets (50-100 mg total) by mouth daily as needed for erectile dysfunction. 5 tablet 11   No current facility-administered medications on file prior to visit.    BP 132/80 mmHg  Pulse 78  Temp(Src) 97.6 F (36.4 C) (Oral)  Resp 20  Ht 5\' 8"  (1.727 m)  Wt 205 lb (92.987 kg)  BMI 31.18 kg/m2  SpO2 98%    Review of Systems  Constitutional: Negative for fever, chills, appetite change and fatigue.  HENT: Negative for congestion, dental problem, ear pain, hearing loss, sore throat, tinnitus, trouble swallowing and voice change.   Eyes: Negative for pain, discharge and visual disturbance.  Respiratory: Negative for cough, chest tightness, wheezing and stridor.   Cardiovascular: Negative for chest pain, palpitations and leg swelling.  Gastrointestinal: Negative for nausea, vomiting, abdominal pain, diarrhea, constipation, blood in stool and abdominal distention.  Genitourinary: Negative for urgency, hematuria, flank pain, discharge, difficulty urinating and genital sores.  Musculoskeletal: Negative for myalgias, back pain, joint swelling, arthralgias, gait problem and neck stiffness.  Skin: Negative for rash.  Neurological: Negative for dizziness, syncope, speech difficulty, weakness, numbness and headaches.  Hematological: Negative for adenopathy. Does not bruise/bleed easily.  Psychiatric/Behavioral: Negative for behavioral problems and dysphoric mood. The patient is not nervous/anxious.        Objective:   Physical Exam  Constitutional: He is oriented to person, place, and time. He appears well-developed.  Blood pressure 130/80  HENT:  Head: Normocephalic.  Right Ear: External ear normal.  Left Ear: External ear normal.  Eyes: Conjunctivae and EOM are normal.  Neck: Normal range of motion.  Cardiovascular: Normal rate and normal heart sounds.   Pulmonary/Chest: Breath sounds normal.   Abdominal: Bowel sounds are normal.  Musculoskeletal: Normal range of motion. He exhibits no edema or tenderness.  Neurological: He is alert and oriented to person, place, and time.  Psychiatric: He has a normal mood and affect. His behavior is normal.          Assessment & Plan:   Diabetes mellitus.  Will check a hemoglobin A1c Dyslipidemia.  Check lipid profile Essential hypertension, stable CAD stable History colonic polyps and angiodysplasia of the colon.  Status post recent colonoscopy  CPX 3 months

## 2015-04-22 NOTE — Progress Notes (Signed)
Pre visit review using our clinic review tool, if applicable. No additional management support is needed unless otherwise documented below in the visit note. 

## 2015-04-22 NOTE — Patient Instructions (Signed)
Please check your hemoglobin A1c every 3 months  Limit your sodium (Salt) intake    It is important that you exercise regularly, at least 20 minutes 3 to 4 times per week.  If you develop chest pain or shortness of breath seek  medical attention.  You need to lose weight.  Consider a lower calorie diet and regular exercise. 

## 2015-04-25 ENCOUNTER — Encounter: Payer: Self-pay | Admitting: Internal Medicine

## 2015-04-25 DIAGNOSIS — Z86010 Personal history of colonic polyps: Secondary | ICD-10-CM

## 2015-04-25 NOTE — Progress Notes (Signed)
Quick Note:  ssp and 4 adenomas - no recall age + comorbidities ______

## 2015-08-10 ENCOUNTER — Other Ambulatory Visit: Payer: Self-pay | Admitting: Internal Medicine

## 2015-08-29 ENCOUNTER — Other Ambulatory Visit: Payer: Self-pay | Admitting: Internal Medicine

## 2015-08-29 ENCOUNTER — Ambulatory Visit (INDEPENDENT_AMBULATORY_CARE_PROVIDER_SITE_OTHER): Payer: Medicare Other | Admitting: Ophthalmology

## 2015-08-29 DIAGNOSIS — E113393 Type 2 diabetes mellitus with moderate nonproliferative diabetic retinopathy without macular edema, bilateral: Secondary | ICD-10-CM | POA: Diagnosis not present

## 2015-08-29 DIAGNOSIS — H35033 Hypertensive retinopathy, bilateral: Secondary | ICD-10-CM

## 2015-08-29 DIAGNOSIS — E10319 Type 1 diabetes mellitus with unspecified diabetic retinopathy without macular edema: Secondary | ICD-10-CM

## 2015-08-29 DIAGNOSIS — H348332 Tributary (branch) retinal vein occlusion, bilateral, stable: Secondary | ICD-10-CM | POA: Diagnosis not present

## 2015-08-29 DIAGNOSIS — H43813 Vitreous degeneration, bilateral: Secondary | ICD-10-CM | POA: Diagnosis not present

## 2015-08-29 DIAGNOSIS — I10 Essential (primary) hypertension: Secondary | ICD-10-CM | POA: Diagnosis not present

## 2015-08-29 NOTE — Telephone Encounter (Signed)
Rx refill sent to pharmacy. 

## 2015-08-30 ENCOUNTER — Other Ambulatory Visit (INDEPENDENT_AMBULATORY_CARE_PROVIDER_SITE_OTHER): Payer: Medicare Other

## 2015-08-30 DIAGNOSIS — Z0000 Encounter for general adult medical examination without abnormal findings: Secondary | ICD-10-CM | POA: Diagnosis not present

## 2015-08-30 LAB — POC URINALSYSI DIPSTICK (AUTOMATED)
BILIRUBIN UA: NEGATIVE
GLUCOSE UA: NEGATIVE
Ketones, UA: NEGATIVE
Leukocytes, UA: NEGATIVE
Nitrite, UA: NEGATIVE
Protein, UA: NEGATIVE
SPEC GRAV UA: 1.02
UROBILINOGEN UA: 1
pH, UA: 5.5

## 2015-08-30 LAB — BASIC METABOLIC PANEL
BUN: 19 mg/dL (ref 6–23)
CALCIUM: 9.6 mg/dL (ref 8.4–10.5)
CO2: 33 mEq/L — ABNORMAL HIGH (ref 19–32)
Chloride: 102 mEq/L (ref 96–112)
Creatinine, Ser: 1.53 mg/dL — ABNORMAL HIGH (ref 0.40–1.50)
GFR: 47.42 mL/min — AB (ref >60.00)
Glucose, Bld: 148 mg/dL — ABNORMAL HIGH (ref 70–99)
Potassium: 4 mEq/L (ref 3.5–5.1)
SODIUM: 142 meq/L (ref 135–145)

## 2015-08-30 LAB — LIPID PANEL
Cholesterol: 160 mg/dL (ref 0–200)
HDL: 44.8 mg/dL (ref >39.00)
LDL Cholesterol: 91 mg/dL (ref 0–99)
NONHDL: 114.92
Total CHOL/HDL Ratio: 4
Triglycerides: 118 mg/dL (ref 0.0–149.0)
VLDL: 23.6 mg/dL (ref 0.0–40.0)

## 2015-08-30 LAB — CBC WITH DIFFERENTIAL/PLATELET
BASOS ABS: 0 10*3/uL (ref 0.0–0.1)
Basophils Relative: 0.5 % (ref 0.0–3.0)
Eosinophils Absolute: 0.2 10*3/uL (ref 0.0–0.7)
Eosinophils Relative: 2.2 % (ref 0.0–5.0)
HEMATOCRIT: 43.1 % (ref 39.0–52.0)
HEMOGLOBIN: 14.4 g/dL (ref 13.0–17.0)
LYMPHS ABS: 2.9 10*3/uL (ref 0.7–4.0)
LYMPHS PCT: 39.1 % (ref 12.0–46.0)
MCHC: 33.4 g/dL (ref 30.0–36.0)
MCV: 87.1 fl (ref 78.0–100.0)
MONOS PCT: 7 % (ref 3.0–12.0)
Monocytes Absolute: 0.5 10*3/uL (ref 0.1–1.0)
NEUTROS PCT: 51.2 % (ref 43.0–77.0)
Neutro Abs: 3.8 10*3/uL (ref 1.4–7.7)
Platelets: 163 10*3/uL (ref 150.0–400.0)
RBC: 4.95 Mil/uL (ref 4.22–5.81)
RDW: 14.9 % (ref 11.5–15.5)
WBC: 7.5 10*3/uL (ref 4.0–10.5)

## 2015-08-30 LAB — HEPATIC FUNCTION PANEL
ALBUMIN: 4 g/dL (ref 3.5–5.2)
ALK PHOS: 59 U/L (ref 39–117)
ALT: 13 U/L (ref 0–53)
AST: 16 U/L (ref 0–37)
Bilirubin, Direct: 0.2 mg/dL (ref 0.0–0.3)
Total Bilirubin: 0.6 mg/dL (ref 0.2–1.2)
Total Protein: 7 g/dL (ref 6.0–8.3)

## 2015-08-30 LAB — MICROALBUMIN / CREATININE URINE RATIO
Creatinine,U: 159.5 mg/dL
Microalb Creat Ratio: 2.3 mg/g (ref 0.0–30.0)
Microalb, Ur: 3.7 mg/dL — ABNORMAL HIGH (ref 0.0–1.9)

## 2015-08-30 LAB — HEMOGLOBIN A1C: HEMOGLOBIN A1C: 6.7 % — AB (ref 4.6–6.5)

## 2015-08-30 LAB — PSA: PSA: 1.22 ng/mL (ref 0.10–4.00)

## 2015-08-30 LAB — TSH: TSH: 2.47 u[IU]/mL (ref 0.35–4.50)

## 2015-09-03 DIAGNOSIS — J189 Pneumonia, unspecified organism: Secondary | ICD-10-CM

## 2015-09-03 HISTORY — DX: Pneumonia, unspecified organism: J18.9

## 2015-09-06 ENCOUNTER — Ambulatory Visit (INDEPENDENT_AMBULATORY_CARE_PROVIDER_SITE_OTHER): Payer: Medicare Other | Admitting: Internal Medicine

## 2015-09-06 ENCOUNTER — Encounter: Payer: Self-pay | Admitting: Internal Medicine

## 2015-09-06 VITALS — BP 112/70 | HR 71 | Ht 68.25 in | Wt 203.4 lb

## 2015-09-06 DIAGNOSIS — G4733 Obstructive sleep apnea (adult) (pediatric): Secondary | ICD-10-CM

## 2015-09-06 DIAGNOSIS — E1151 Type 2 diabetes mellitus with diabetic peripheral angiopathy without gangrene: Secondary | ICD-10-CM | POA: Diagnosis not present

## 2015-09-06 DIAGNOSIS — Z0000 Encounter for general adult medical examination without abnormal findings: Secondary | ICD-10-CM

## 2015-09-06 DIAGNOSIS — Z86010 Personal history of colonic polyps: Secondary | ICD-10-CM

## 2015-09-06 DIAGNOSIS — I480 Paroxysmal atrial fibrillation: Secondary | ICD-10-CM

## 2015-09-06 DIAGNOSIS — E785 Hyperlipidemia, unspecified: Secondary | ICD-10-CM

## 2015-09-06 DIAGNOSIS — I10 Essential (primary) hypertension: Secondary | ICD-10-CM

## 2015-09-06 MED ORDER — POLYETHYLENE GLYCOL 3350 17 GM/SCOOP PO POWD: 17.0000 g | Freq: Two times a day (BID) | ORAL | 1 refills | Status: DC | PRN

## 2015-09-06 NOTE — Patient Instructions (Addendum)
Limit your sodium (Salt) intake    It is important that you exercise regularly, at least 20 minutes 3 to 4 times per week.  If you develop chest pain or shortness of breath seek  medical attention.  You need to lose weight.  Consider a lower calorie diet and regular exercise.   Please check your hemoglobin A1c every 3 months  Discontinue chlorthalidone  Sleep study as discussed

## 2015-09-06 NOTE — Progress Notes (Signed)
Pre visit review using our clinic review tool, if applicable. No additional management support is needed unless otherwise documented below in the visit note. 

## 2015-09-06 NOTE — Progress Notes (Signed)
Subjective:    Patient ID: Jason Fisher, male    DOB: [birth year], 75 y.o.   MRN: [id number]  HPI  Lab Results  Component Value Date   HGBA1C 6.7 (H) 08/30/2015   75 year old patient who is seen today for an annual exam.  Medical problems include type 2 diabetes.  He is had a recent eye examination.  Hemoglobin A1c has been nicely controlled.  Complaints the include the fatigue.  This seems fairly chronic.  He describes some decreased exercise tolerance.  No exertional chest pain. He does have paroxysmal atrial fibrillation and remains on Coumadin anticoagulation. He continues to smoke.  He has a history colonic polyps and has had a colonoscopy earlier this year.  Denies history of loud snoring but does have chronic fatigue and some daytime sleepiness  Past Medical History:  Diagnosis Date  . 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 Atrial fibrillation ([place]) 11/06/2009  . BENIGN PROSTATIC HYPERTROPHY, HX OF 02/24/2007  . 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 COLONIC POLYPS, HX OF 11/02/2006  . CORONARY ARTERY DISEASE 11/02/2006  . DIABETES MELLITUS, TYPE II 10/29/2006  . Diverticulosis   . DYSPNEA ON EXERTION 07/07/2008  . HYPERLIPIDEMIA 10/29/2006  . HYPERTENSION 10/29/2006  . MYOCARDIAL INFARCTION, HX OF 11/02/2006  . TOBACCO USER 04/17/2010     Social History   Social History  . Marital status: Married    Spouse name: N/A  . Number of children: 5  . Years of education: N/A   Occupational History  . [employer]    Social History Main Topics  . Smoking status: Current Some Day Smoker    Types: Cigarettes  . Smokeless tobacco: Never Used     Comment: passive smoker as well  . Alcohol use No  . Drug use: No  . Sexual activity: Not on file   Other Topics Concern  . Not on file   Social History Narrative  . No narrative on file    Past Surgical History:  Procedure Laterality Date  . CATARACT EXTRACTION    . COLONOSCOPY    . CORONARY ANGIOPLASTY WITH STENT  PLACEMENT    . SHOULDER SURGERY Right     Family History  Problem Relation Age of Onset  . Heart attack Mother   . Heart attack Father   . Hypertension Father   . Heart attack Brother   . Diabetes Mother   . Diabetes Brother     No Known Allergies  Current Outpatient Prescriptions on File Prior to Visit  Medication Sig Dispense Refill  . ACCU-CHEK AVIVA PLUS test strip CHECK BLOOD SUGAR ONCE DAILY AS NEEDED 100 each 4  . apixaban (ELIQUIS) 5 MG TABS tablet Take 1 tablet (5 mg total) by mouth 2 (two) times daily. 60 tablet 6  . atorvastatin (LIPITOR) 20 MG tablet TAKE 1 TABLET BY MOUTH EVERY DAY 90 tablet 1  . chlorthalidone (HYGROTON) 25 MG tablet Take 1 tablet (25 mg total) by mouth daily. 90 tablet 1  . clopidogrel (PLAVIX) 75 MG tablet TAKE 1 TABLET BY MOUTH EVERY DAY 90 tablet 1  . clotrimazole (LOTRIMIN) 1 % cream Apply 1 application topically 2 (two) times daily. 30 g 2  . diltiazem (CARDIZEM CD) 120 MG 24 hr capsule Take 1 capsule (120 mg total) by mouth daily. 90 capsule 1  . diltiazem (CARDIZEM CD) 120 MG 24 hr capsule TAKE 1 CAPSULE EVERY DAY 90 capsule 1  . glimepiride (AMARYL) 4 MG tablet TAKE 1 TABLET  BY MOUTH EVERY MORNING BEFORE BREAKFAST 90 tablet 1  . HYDROcodone-acetaminophen (NORCO/VICODIN) 5-325 MG tablet Take 1 tablet by mouth every 6 (six) hours as needed for moderate pain. 30 tablet 0  . Insulin Pen Needle 29G X 12.7MM MISC 1 each by Does not apply route 1 day or 1 dose. 100 each 3  . Lancets (ACCU-CHEK MULTICLIX) lancets Once daily 250.00 100 each 3  . metFORMIN (GLUCOPHAGE) 1000 MG tablet TAKE 1 TABLET TWICE DAILY WITH A MEAL 180 tablet 1  . metoprolol (LOPRESSOR) 100 MG tablet Take 1 tablet (100 mg total) by mouth 2 (two) times daily. 180 tablet 1  . metoprolol (LOPRESSOR) 100 MG tablet TAKE 1 TABLET TWICE DAILY 180 tablet 1  . NITROSTAT 0.4 MG SL tablet PLACE 1 TABLET UNDER TONGUE EVERY 5 MINUTES AS NEEDED 25 tablet 0  . pioglitazone (ACTOS) 45 MG tablet  TAKE 1 TABLET BY MOUTH EVERY DAY 90 tablet 2  . ramipril (ALTACE) 10 MG capsule TAKE ONE CAPSULE BY MOUTH EVERY DAY 90 capsule 1  . sildenafil (VIAGRA) 100 MG tablet Take 0.5-1 tablets (50-100 mg total) by mouth daily as needed for erectile dysfunction. 5 tablet 11  . VICTOZA 18 MG/3ML SOPN INJECT 0.3MLS INTO SKIN DAILY 9 pen 1   No current facility-administered medications on file prior to visit.     BP 112/70 (BP Location: Left Arm, Patient Position: Sitting, Cuff Size: Large)   Pulse 71   Ht 5' 8.25" (1.734 m)   Wt 203 lb 6.4 oz (92.3 kg)   SpO2 95%   BMI 30.70 kg/m    Medicare wellness visit   1. Risk factors, based on past  M,S,F history.  Patient has known coronary artery disease.  Card and asked risk factors include hypertension, dyslipidemia, diabetes and ongoing tobacco use  2.  Physical activities: Complains of exercise intolerance but performs usual daytime activities without limitations  3.  Depression/mood: Denies depression or history depression  4.  Hearing: No deficits  5.  ADL's: Independent  6.  Fall risk: Low  7.  Home safety: No problems identified  8.  Height weight, and visual acuity; height and weight stable no change in visual acuity  9.  Counseling: Heart healthy diet more regular exercise modest weight loss and total smoking cessation.  All encouraged  10. Lab orders based on risk factors: Laboratory studies reviewed  11. Referral : Follow-up cardiology.  Will set up for home sleep study  12. Care plan: Home sleep study.  Continue efforts at aggressive risk factor modification  13. Cognitive assessment: Alert and oriented with normal affect no cognitive dysfunction   14. Screening: Patient provided with a written and personalized 5-10 year screening schedule in the AVS.   We'll continue colonoscopies at five-year intervals and yearly eye examinations  15. Provider List Update:  Primary care cardiology GI and ophthalmology     Review of  Systems  Constitutional: Positive for fatigue. Negative for activity change, appetite change, chills and fever.  HENT: Negative for congestion, dental problem, ear pain, hearing loss, mouth sores, rhinorrhea, sinus pressure, sneezing, tinnitus, trouble swallowing and voice change.   Eyes: Negative for photophobia, pain, redness and visual disturbance.  Respiratory: Negative for apnea, cough, choking, chest tightness, shortness of breath and wheezing.   Cardiovascular: Negative for chest pain, palpitations and leg swelling.  Gastrointestinal: Negative for abdominal distention, abdominal pain, anal bleeding, blood in stool, constipation, diarrhea, nausea, rectal pain and vomiting.  Genitourinary: Negative for decreased urine  volume, difficulty urinating, discharge, dysuria, flank pain, frequency, genital sores, hematuria, penile swelling, scrotal swelling, testicular pain and urgency.  Musculoskeletal: Negative for arthralgias, back pain, gait problem, joint swelling, myalgias, neck pain and neck stiffness.  Skin: Negative for color change, rash and wound.  Neurological: Positive for weakness. Negative for dizziness, tremors, seizures, syncope, facial asymmetry, speech difficulty, light-headedness, numbness and headaches.  Hematological: Negative for adenopathy. Does not bruise/bleed easily.  Psychiatric/Behavioral: Negative for agitation, behavioral problems, confusion, decreased concentration, dysphoric mood, hallucinations, self-injury, sleep disturbance and suicidal ideas. The patient is not nervous/anxious.        Objective:   Physical Exam  Constitutional: He appears well-developed and well-nourished.  Blood pressure 100/70  Weight 203  HENT:  Head: Normocephalic and atraumatic.  Right Ear: External ear normal.  Left Ear: External ear normal.  Nose: Nose normal.  Mouth/Throat: Oropharynx is clear and moist.  Pharyngeal crowding with very low hanging soft palate  Eyes: Conjunctivae  and EOM are normal. Pupils are equal, round, and reactive to light. No scleral icterus.  Neck: Normal range of motion. Neck supple. No JVD present. No thyromegaly present.  Cardiovascular: Regular rhythm and normal heart sounds.  Exam reveals no gallop and no friction rub.   No murmur heard. Pedal pulses difficult to palpate  Pulmonary/Chest: Effort normal. He exhibits no tenderness.  Generally diminished breath sounds Few basilar crackles  Abdominal: Soft. Bowel sounds are normal. He exhibits no distension and no mass. There is no tenderness.  Genitourinary: Prostate normal and penis normal.  Musculoskeletal: Normal range of motion. He exhibits no edema or tenderness.  Lymphadenopathy:    He has no cervical adenopathy.  Neurological: He is alert. He has normal reflexes. No cranial nerve deficit. Coordination normal.  Skin: Skin is warm and dry. No rash noted.  Psychiatric: He has a normal mood and affect. His behavior is normal.          Assessment & Plan:   Preventive health examination History of hypertension.  Blood pressure low normal today and creatinine clearance decreased to 47.  Will discontinue chlorthalidone Diabetes mellitus stable Fatigue.  Rule out OSA.  Heard.  Will set up for home sleep study Coronary artery disease Tobacco use.  Patient states she smokes very little.  Total smoking cessation encouraged Paroxysmal atrial fibrillation.  Continue anticoagulation  Recheck 3 months \  Nyoka Cowden, MD

## 2015-09-16 ENCOUNTER — Telehealth: Payer: Self-pay | Admitting: Cardiology

## 2015-09-16 NOTE — Telephone Encounter (Signed)
New message    Pt expressed wanting to see the doctor earlier than his scheduled appt on 01/10/16. He stated that he gets so tired all the time esp during climbing steps and he breaths really hard. Pt did not want to see anyone else. Please call.

## 2015-09-16 NOTE — Telephone Encounter (Signed)
Returned call to patient. He states he would like to see Dr. Martinique sooner than his Dec 2017 appt. He states he gets "so tired" for a while and all but this has gotten worse over the last month.   Offered PA or NP appointment, to which patient agreed.  Scheduled to see H. Meng PA on 8/28/ @ 0900 @ [facility].

## 2015-09-22 ENCOUNTER — Other Ambulatory Visit: Payer: Self-pay | Admitting: Internal Medicine

## 2015-09-24 ENCOUNTER — Encounter (HOSPITAL_COMMUNITY): Payer: Self-pay | Admitting: Emergency Medicine

## 2015-09-24 ENCOUNTER — Emergency Department (HOSPITAL_COMMUNITY): Payer: Medicare Other

## 2015-09-24 DIAGNOSIS — T380X5A Adverse effect of glucocorticoids and synthetic analogues, initial encounter: Secondary | ICD-10-CM | POA: Diagnosis not present

## 2015-09-24 DIAGNOSIS — N183 Chronic kidney disease, stage 3 (moderate): Secondary | ICD-10-CM | POA: Diagnosis present

## 2015-09-24 DIAGNOSIS — I130 Hypertensive heart and chronic kidney disease with heart failure and stage 1 through stage 4 chronic kidney disease, or unspecified chronic kidney disease: Secondary | ICD-10-CM | POA: Diagnosis not present

## 2015-09-24 DIAGNOSIS — E1122 Type 2 diabetes mellitus with diabetic chronic kidney disease: Secondary | ICD-10-CM | POA: Diagnosis present

## 2015-09-24 DIAGNOSIS — Z8249 Family history of ischemic heart disease and other diseases of the circulatory system: Secondary | ICD-10-CM

## 2015-09-24 DIAGNOSIS — I2510 Atherosclerotic heart disease of native coronary artery without angina pectoris: Secondary | ICD-10-CM | POA: Diagnosis not present

## 2015-09-24 DIAGNOSIS — I252 Old myocardial infarction: Secondary | ICD-10-CM

## 2015-09-24 DIAGNOSIS — J9601 Acute respiratory failure with hypoxia: Secondary | ICD-10-CM | POA: Diagnosis not present

## 2015-09-24 DIAGNOSIS — J9801 Acute bronchospasm: Secondary | ICD-10-CM | POA: Diagnosis not present

## 2015-09-24 DIAGNOSIS — Y95 Nosocomial condition: Secondary | ICD-10-CM | POA: Diagnosis not present

## 2015-09-24 DIAGNOSIS — I5030 Unspecified diastolic (congestive) heart failure: Secondary | ICD-10-CM | POA: Diagnosis not present

## 2015-09-24 DIAGNOSIS — I25110 Atherosclerotic heart disease of native coronary artery with unstable angina pectoris: Secondary | ICD-10-CM | POA: Diagnosis not present

## 2015-09-24 DIAGNOSIS — Z7902 Long term (current) use of antithrombotics/antiplatelets: Secondary | ICD-10-CM | POA: Diagnosis not present

## 2015-09-24 DIAGNOSIS — I5023 Acute on chronic systolic (congestive) heart failure: Secondary | ICD-10-CM | POA: Diagnosis not present

## 2015-09-24 DIAGNOSIS — T7849XA Other allergy, initial encounter: Secondary | ICD-10-CM | POA: Diagnosis not present

## 2015-09-24 DIAGNOSIS — Z7901 Long term (current) use of anticoagulants: Secondary | ICD-10-CM | POA: Diagnosis not present

## 2015-09-24 DIAGNOSIS — T7849XD Other allergy, subsequent encounter: Secondary | ICD-10-CM | POA: Diagnosis not present

## 2015-09-24 DIAGNOSIS — E785 Hyperlipidemia, unspecified: Secondary | ICD-10-CM | POA: Diagnosis not present

## 2015-09-24 DIAGNOSIS — J189 Pneumonia, unspecified organism: Secondary | ICD-10-CM | POA: Diagnosis not present

## 2015-09-24 DIAGNOSIS — I5043 Acute on chronic combined systolic (congestive) and diastolic (congestive) heart failure: Secondary | ICD-10-CM | POA: Diagnosis not present

## 2015-09-24 DIAGNOSIS — Z794 Long term (current) use of insulin: Secondary | ICD-10-CM

## 2015-09-24 DIAGNOSIS — J440 Chronic obstructive pulmonary disease with acute lower respiratory infection: Secondary | ICD-10-CM | POA: Diagnosis not present

## 2015-09-24 DIAGNOSIS — T508X5A Adverse effect of diagnostic agents, initial encounter: Secondary | ICD-10-CM | POA: Diagnosis not present

## 2015-09-24 DIAGNOSIS — I714 Abdominal aortic aneurysm, without rupture: Secondary | ICD-10-CM | POA: Diagnosis present

## 2015-09-24 DIAGNOSIS — R079 Chest pain, unspecified: Secondary | ICD-10-CM | POA: Diagnosis not present

## 2015-09-24 DIAGNOSIS — R05 Cough: Secondary | ICD-10-CM | POA: Diagnosis not present

## 2015-09-24 DIAGNOSIS — Z833 Family history of diabetes mellitus: Secondary | ICD-10-CM

## 2015-09-24 DIAGNOSIS — F17210 Nicotine dependence, cigarettes, uncomplicated: Secondary | ICD-10-CM | POA: Diagnosis present

## 2015-09-24 DIAGNOSIS — I509 Heart failure, unspecified: Secondary | ICD-10-CM | POA: Diagnosis not present

## 2015-09-24 DIAGNOSIS — I482 Chronic atrial fibrillation: Secondary | ICD-10-CM | POA: Diagnosis present

## 2015-09-24 DIAGNOSIS — I10 Essential (primary) hypertension: Secondary | ICD-10-CM | POA: Diagnosis not present

## 2015-09-24 DIAGNOSIS — R0602 Shortness of breath: Secondary | ICD-10-CM | POA: Diagnosis not present

## 2015-09-24 DIAGNOSIS — I110 Hypertensive heart disease with heart failure: Secondary | ICD-10-CM | POA: Diagnosis not present

## 2015-09-24 DIAGNOSIS — I480 Paroxysmal atrial fibrillation: Secondary | ICD-10-CM | POA: Diagnosis present

## 2015-09-24 DIAGNOSIS — Y92238 Other place in hospital as the place of occurrence of the external cause: Secondary | ICD-10-CM | POA: Diagnosis not present

## 2015-09-24 DIAGNOSIS — E1151 Type 2 diabetes mellitus with diabetic peripheral angiopathy without gangrene: Secondary | ICD-10-CM | POA: Diagnosis not present

## 2015-09-24 DIAGNOSIS — Z955 Presence of coronary angioplasty implant and graft: Secondary | ICD-10-CM

## 2015-09-24 LAB — CBC
HEMATOCRIT: 41.8 % (ref 39.0–52.0)
Hemoglobin: 13.1 g/dL (ref 13.0–17.0)
MCH: 28.9 pg (ref 26.0–34.0)
MCHC: 31.3 g/dL (ref 30.0–36.0)
MCV: 92.3 fL (ref 78.0–100.0)
PLATELETS: 148 10*3/uL — AB (ref 150–400)
RBC: 4.53 MIL/uL (ref 4.22–5.81)
RDW: 14.6 % (ref 11.5–15.5)
WBC: 6.6 10*3/uL (ref 4.0–10.5)

## 2015-09-24 LAB — BASIC METABOLIC PANEL
Anion gap: 5 (ref 5–15)
BUN: 22 mg/dL — AB (ref 6–20)
CO2: 27 mmol/L (ref 22–32)
CREATININE: 1.25 mg/dL — AB (ref 0.61–1.24)
Calcium: 9.3 mg/dL (ref 8.9–10.3)
Chloride: 108 mmol/L (ref 101–111)
GFR, EST NON AFRICAN AMERICAN: 55 mL/min — AB (ref >60)
Glucose, Bld: 181 mg/dL — ABNORMAL HIGH (ref 65–99)
POTASSIUM: 3.6 mmol/L (ref 3.5–5.1)
SODIUM: 140 mmol/L (ref 135–145)

## 2015-09-24 LAB — BRAIN NATRIURETIC PEPTIDE: B NATRIURETIC PEPTIDE 5: 155.5 pg/mL — AB (ref 0.0–100.0)

## 2015-09-24 LAB — I-STAT TROPONIN, ED: Troponin i, poc: 0 ng/mL (ref 0.00–0.08)

## 2015-09-24 NOTE — ED Triage Notes (Signed)
Patient arrives with complaint of chest pressure and leg swelling. Onset of chest pain was "a long time ago". Endorses constant chest pain of some intensity everyday. Leg swelling is new for patient and he states it began on Sunday. Endorses increased SOB with exertion vs baseline.

## 2015-09-25 ENCOUNTER — Encounter (HOSPITAL_COMMUNITY): Payer: Self-pay | Admitting: Internal Medicine

## 2015-09-25 ENCOUNTER — Inpatient Hospital Stay (HOSPITAL_COMMUNITY)
Admission: EM | Admit: 2015-09-25 | Discharge: 2015-10-03 | DRG: 246 | Disposition: A | Discharge status: Home Health | Payer: Medicare Other | Attending: Internal Medicine | Admitting: Internal Medicine

## 2015-09-25 ENCOUNTER — Other Ambulatory Visit: Payer: Self-pay | Admitting: *Deleted

## 2015-09-25 DIAGNOSIS — E1151 Type 2 diabetes mellitus with diabetic peripheral angiopathy without gangrene: Secondary | ICD-10-CM | POA: Diagnosis not present

## 2015-09-25 DIAGNOSIS — I5043 Acute on chronic combined systolic (congestive) and diastolic (congestive) heart failure: Secondary | ICD-10-CM

## 2015-09-25 DIAGNOSIS — E785 Hyperlipidemia, unspecified: Secondary | ICD-10-CM

## 2015-09-25 DIAGNOSIS — I480 Paroxysmal atrial fibrillation: Secondary | ICD-10-CM | POA: Diagnosis not present

## 2015-09-25 DIAGNOSIS — I200 Unstable angina: Secondary | ICD-10-CM

## 2015-09-25 DIAGNOSIS — I5030 Unspecified diastolic (congestive) heart failure: Secondary | ICD-10-CM

## 2015-09-25 DIAGNOSIS — I10 Essential (primary) hypertension: Secondary | ICD-10-CM

## 2015-09-25 DIAGNOSIS — R0602 Shortness of breath: Secondary | ICD-10-CM

## 2015-09-25 DIAGNOSIS — T508X5A Adverse effect of diagnostic agents, initial encounter: Secondary | ICD-10-CM | POA: Diagnosis not present

## 2015-09-25 DIAGNOSIS — R0902 Hypoxemia: Secondary | ICD-10-CM | POA: Clinically undetermined

## 2015-09-25 DIAGNOSIS — I482 Chronic atrial fibrillation, unspecified: Secondary | ICD-10-CM | POA: Diagnosis present

## 2015-09-25 DIAGNOSIS — I714 Abdominal aortic aneurysm, without rupture, unspecified: Secondary | ICD-10-CM | POA: Diagnosis present

## 2015-09-25 DIAGNOSIS — Z9861 Coronary angioplasty status: Secondary | ICD-10-CM

## 2015-09-25 DIAGNOSIS — I110 Hypertensive heart disease with heart failure: Secondary | ICD-10-CM | POA: Diagnosis not present

## 2015-09-25 DIAGNOSIS — R079 Chest pain, unspecified: Secondary | ICD-10-CM

## 2015-09-25 DIAGNOSIS — I2510 Atherosclerotic heart disease of native coronary artery without angina pectoris: Secondary | ICD-10-CM

## 2015-09-25 DIAGNOSIS — I5023 Acute on chronic systolic (congestive) heart failure: Secondary | ICD-10-CM

## 2015-09-25 DIAGNOSIS — N183 Chronic kidney disease, stage 3 unspecified: Secondary | ICD-10-CM | POA: Diagnosis present

## 2015-09-25 DIAGNOSIS — I4891 Unspecified atrial fibrillation: Secondary | ICD-10-CM

## 2015-09-25 DIAGNOSIS — T7849XA Other allergy, initial encounter: Secondary | ICD-10-CM | POA: Diagnosis not present

## 2015-09-25 DIAGNOSIS — F17200 Nicotine dependence, unspecified, uncomplicated: Secondary | ICD-10-CM

## 2015-09-25 DIAGNOSIS — I509 Heart failure, unspecified: Secondary | ICD-10-CM

## 2015-09-25 DIAGNOSIS — J9601 Acute respiratory failure with hypoxia: Secondary | ICD-10-CM | POA: Diagnosis not present

## 2015-09-25 DIAGNOSIS — J189 Pneumonia, unspecified organism: Secondary | ICD-10-CM | POA: Diagnosis not present

## 2015-09-25 HISTORY — DX: Atherosclerotic heart disease of native coronary artery without angina pectoris: I25.10

## 2015-09-25 HISTORY — DX: Chronic kidney disease, stage 3 unspecified: N18.30

## 2015-09-25 HISTORY — DX: Chronic atrial fibrillation, unspecified: I48.20

## 2015-09-25 HISTORY — DX: Chronic kidney disease, stage 3 (moderate): N18.3

## 2015-09-25 HISTORY — DX: Coronary angioplasty status: Z98.61

## 2015-09-25 LAB — LIPID PANEL
CHOL/HDL RATIO: 2.9 ratio
CHOLESTEROL: 137 mg/dL (ref 0–200)
HDL: 47 mg/dL (ref >40)
LDL Cholesterol: 77 mg/dL (ref 0–99)
TRIGLYCERIDES: 64 mg/dL (ref <150)
VLDL: 13 mg/dL (ref 0–40)

## 2015-09-25 LAB — GLUCOSE, CAPILLARY
GLUCOSE-CAPILLARY: 216 mg/dL — AB (ref 65–99)
GLUCOSE-CAPILLARY: 111 mg/dL — AB (ref 65–99)
Glucose-Capillary: 109 mg/dL — ABNORMAL HIGH (ref 65–99)
Glucose-Capillary: 157 mg/dL — ABNORMAL HIGH (ref 65–99)
Glucose-Capillary: 140 mg/dL — ABNORMAL HIGH (ref 65–99)

## 2015-09-25 LAB — TROPONIN I
Troponin I: <0.03 ng/mL (ref <0.03)
Troponin I: <0.03 ng/mL (ref <0.03)

## 2015-09-25 LAB — MAGNESIUM: Magnesium: 2.1 mg/dL (ref 1.7–2.4)

## 2015-09-25 LAB — PROTIME-INR
INR: 1.22
Prothrombin Time: 15.5 seconds — ABNORMAL HIGH (ref 11.4–15.2)

## 2015-09-25 MED ORDER — METOPROLOL TARTRATE 100 MG PO TABS
100.0000 mg | ORAL_TABLET | Freq: Two times a day (BID) | ORAL | Status: DC
  Administered 2015-09-25 – 2015-10-03 (×17): 100 mg via ORAL
  Filled 2015-09-25: qty 1
  Filled 2015-09-25: qty 2
  Filled 2015-09-25: qty 1
  Filled 2015-09-25: qty 2
  Filled 2015-09-25 (×6): qty 1
  Filled 2015-09-25 (×2): qty 2
  Filled 2015-09-25 (×2): qty 4
  Filled 2015-09-25 (×2): qty 1
  Filled 2015-09-25: qty 4

## 2015-09-25 MED ORDER — ATORVASTATIN CALCIUM 20 MG PO TABS
20.0000 mg | ORAL_TABLET | Freq: Every day | ORAL | Status: DC
  Administered 2015-09-25 – 2015-10-02 (×8): 20 mg via ORAL
  Filled 2015-09-25 (×6): qty 1
  Filled 2015-09-25: qty 2
  Filled 2015-09-25: qty 1

## 2015-09-25 MED ORDER — SODIUM CHLORIDE 0.9% FLUSH
3.0000 mL | Freq: Two times a day (BID) | INTRAVENOUS | Status: DC
  Administered 2015-09-25 (×3): 3 mL via INTRAVENOUS

## 2015-09-25 MED ORDER — ASPIRIN 81 MG PO CHEW
324.0000 mg | CHEWABLE_TABLET | Freq: Once | ORAL | Status: DC
  Filled 2015-09-25: qty 4

## 2015-09-25 MED ORDER — SODIUM CHLORIDE 0.9% FLUSH: 3.0000 mL | INTRAVENOUS | Status: DC | PRN

## 2015-09-25 MED ORDER — RAMIPRIL 10 MG PO CAPS
10.0000 mg | ORAL_CAPSULE | Freq: Every day | ORAL | Status: DC
  Administered 2015-09-25 – 2015-10-03 (×8): 10 mg via ORAL
  Filled 2015-09-25 (×8): qty 1

## 2015-09-25 MED ORDER — ACETAMINOPHEN 325 MG PO TABS: 650.0000 mg | ORAL_TABLET | ORAL | Status: DC | PRN

## 2015-09-25 MED ORDER — SODIUM CHLORIDE 0.9 % IV SOLN
INTRAVENOUS | Status: DC
  Administered 2015-09-26: 09:00:00 via INTRAVENOUS

## 2015-09-25 MED ORDER — CLOPIDOGREL BISULFATE 75 MG PO TABS
75.0000 mg | ORAL_TABLET | Freq: Every day | ORAL | Status: AC
  Administered 2015-09-26: 75 mg via ORAL
  Filled 2015-09-25: qty 1

## 2015-09-25 MED ORDER — SODIUM CHLORIDE 0.9 % IV SOLN: 250.0000 mL | INTRAVENOUS | Status: DC | PRN

## 2015-09-25 MED ORDER — HYDROCODONE-ACETAMINOPHEN 5-325 MG PO TABS: 1.0000 | ORAL_TABLET | Freq: Four times a day (QID) | ORAL | Status: DC | PRN

## 2015-09-25 MED ORDER — SODIUM CHLORIDE 0.9% FLUSH
3.0000 mL | Freq: Two times a day (BID) | INTRAVENOUS | Status: DC
  Administered 2015-09-25: 3 mL via INTRAVENOUS

## 2015-09-25 MED ORDER — DILTIAZEM HCL ER COATED BEADS 120 MG PO CP24
120.0000 mg | ORAL_CAPSULE | Freq: Every day | ORAL | Status: DC
  Administered 2015-09-25 – 2015-10-03 (×8): 120 mg via ORAL
  Filled 2015-09-25 (×8): qty 1

## 2015-09-25 MED ORDER — CLOPIDOGREL BISULFATE 75 MG PO TABS
75.0000 mg | ORAL_TABLET | Freq: Every day | ORAL | Status: DC
  Administered 2015-09-25: 75 mg via ORAL
  Filled 2015-09-25 (×2): qty 1

## 2015-09-25 MED ORDER — FUROSEMIDE 10 MG/ML IJ SOLN
40.0000 mg | Freq: Two times a day (BID) | INTRAMUSCULAR | Status: DC
  Administered 2015-09-25 (×2): 40 mg via INTRAVENOUS
  Filled 2015-09-25: qty 4

## 2015-09-25 MED ORDER — INSULIN ASPART 100 UNIT/ML ~~LOC~~ SOLN
0.0000 [IU] | Freq: Every day | SUBCUTANEOUS | Status: DC
  Administered 2015-09-26: 2 [IU] via SUBCUTANEOUS
  Administered 2015-09-27: 4 [IU] via SUBCUTANEOUS
  Administered 2015-09-28 – 2015-09-30 (×2): 3 [IU] via SUBCUTANEOUS
  Administered 2015-10-01: 4 [IU] via SUBCUTANEOUS

## 2015-09-25 MED ORDER — ASPIRIN EC 81 MG PO TBEC
81.0000 mg | DELAYED_RELEASE_TABLET | Freq: Every day | ORAL | Status: DC
  Filled 2015-09-25: qty 1

## 2015-09-25 MED ORDER — ASPIRIN 81 MG PO CHEW
324.0000 mg | CHEWABLE_TABLET | Freq: Once | ORAL | Status: DC
Start: 2015-09-25 — End: 2015-09-25

## 2015-09-25 MED ORDER — CLOTRIMAZOLE 1 % EX CREA
1.0000 "application " | TOPICAL_CREAM | Freq: Two times a day (BID) | CUTANEOUS | Status: DC
  Administered 2015-09-25 – 2015-10-03 (×8): 1 via TOPICAL
  Filled 2015-09-25 (×4): qty 15

## 2015-09-25 MED ORDER — ONDANSETRON HCL 4 MG/2ML IJ SOLN: 4.0000 mg | Freq: Four times a day (QID) | INTRAMUSCULAR | Status: DC | PRN

## 2015-09-25 MED ORDER — FUROSEMIDE 10 MG/ML IJ SOLN
40.0000 mg | Freq: Once | INTRAMUSCULAR | Status: DC
  Filled 2015-09-25: qty 4

## 2015-09-25 MED ORDER — ASPIRIN 81 MG PO CHEW
81.0000 mg | CHEWABLE_TABLET | Freq: Every day | ORAL | Status: AC
  Administered 2015-09-26: 81 mg via ORAL
  Filled 2015-09-25: qty 1

## 2015-09-25 MED ORDER — APIXABAN 5 MG PO TABS
5.0000 mg | ORAL_TABLET | Freq: Two times a day (BID) | ORAL | Status: DC
  Administered 2015-09-25: 5 mg via ORAL
  Filled 2015-09-25: qty 1

## 2015-09-25 MED ORDER — POLYETHYLENE GLYCOL 3350 17 G PO PACK: 17.0000 g | PACK | Freq: Two times a day (BID) | ORAL | Status: DC | PRN

## 2015-09-25 MED ORDER — NITROGLYCERIN 0.4 MG SL SUBL: 0.4000 mg | SUBLINGUAL_TABLET | SUBLINGUAL | Status: DC | PRN

## 2015-09-25 MED ORDER — FUROSEMIDE 10 MG/ML IJ SOLN: 40.0000 mg | Freq: Once | INTRAMUSCULAR | Status: DC

## 2015-09-25 MED ORDER — POLYETHYLENE GLYCOL 3350 17 GM/SCOOP PO POWD: 17.0000 g | Freq: Two times a day (BID) | ORAL | Status: DC | PRN

## 2015-09-25 MED ORDER — INSULIN ASPART 100 UNIT/ML ~~LOC~~ SOLN
0.0000 [IU] | Freq: Three times a day (TID) | SUBCUTANEOUS | Status: DC
  Administered 2015-09-25: 1 [IU] via SUBCUTANEOUS
  Administered 2015-09-25: 3 [IU] via SUBCUTANEOUS
  Administered 2015-09-25: 2 [IU] via SUBCUTANEOUS
  Administered 2015-09-26: 3 [IU] via SUBCUTANEOUS
  Administered 2015-09-27: 7 [IU] via SUBCUTANEOUS
  Administered 2015-09-27 (×2): 2 [IU] via SUBCUTANEOUS
  Administered 2015-09-28: 3 [IU] via SUBCUTANEOUS
  Administered 2015-09-28: 7 [IU] via SUBCUTANEOUS
  Administered 2015-09-28: 5 [IU] via SUBCUTANEOUS
  Administered 2015-09-29: 3 [IU] via SUBCUTANEOUS
  Administered 2015-09-29: 5 [IU] via SUBCUTANEOUS
  Administered 2015-09-29 – 2015-09-30 (×3): 3 [IU] via SUBCUTANEOUS
  Administered 2015-09-30 – 2015-10-01 (×2): 5 [IU] via SUBCUTANEOUS
  Administered 2015-10-01: 7 [IU] via SUBCUTANEOUS
  Administered 2015-10-01: 2 [IU] via SUBCUTANEOUS
  Administered 2015-10-02: 1 [IU] via SUBCUTANEOUS
  Administered 2015-10-02 (×2): 3 [IU] via SUBCUTANEOUS

## 2015-09-25 MED ORDER — NICOTINE 21 MG/24HR TD PT24
21.0000 mg | MEDICATED_PATCH | Freq: Every day | TRANSDERMAL | Status: DC
  Administered 2015-09-25 – 2015-10-03 (×8): 21 mg via TRANSDERMAL
  Filled 2015-09-25 (×8): qty 1

## 2015-09-25 NOTE — Progress Notes (Signed)
I have seen and assessed patient and agree with Dr.Niu's assessment and plan. Patient is a 75 year old gentleman with history of coronary artery disease status post stent placement, atrial fibrillation on chronic anticoagulation, AAA, hypertension, hyperlipidemia, diabetes, chronic kidney disease stage III presented with shortness of breath, chest pain described as a pressure ongoing for the past 2 months which is worse on exertion. Patient states has been mostly compliant with his medications have a main [place] dose here and there and per wife patient not fully compliant with diet. Patient noted to be volume overloaded and placed on IV Lasix. Cardiac enzymes pending. 2-D echo pending. Due to patient's risk factors will consult with cardiology for further evaluation and management.

## 2015-09-25 NOTE — ED Notes (Signed)
Attempted to call report. Floor RN unable to accept report.  

## 2015-09-25 NOTE — Care Management Obs Status (Signed)
[facility] NOTIFICATION   Patient Details  Name: KY CLUM MRN: [id number] Date of Birth: 02-Oct-1940   Medicare Observation Status Notification Given:       Delrae Sawyers, RN 09/25/2015, 10:34 AM

## 2015-09-25 NOTE — Care Management Note (Addendum)
Case Management Note  Patient Details  Name: Jason Fisher MRN: [id number] Date of Birth: 07/14/1940  Subjective/Objective:  75 y.o. M admitted 09/25/2015 for Shortness of breath, chest pain, leg edema x 2 months.(New HF) Independen in pvt Residence with wife. 2 adult Sons live locally and can assist if needed. Pt has PCP and [employer]-Medicare. CM offered HHRN and pt, and family expressed interest and has chosen [facility] to provide this service. Manuela Schwartz made aware) Will also refer to [facility] and referred to unit rep. Atika.                    Action/Plan:  Anticipate discharge home with no needs.  No further CM needs but will be available should additional discharge needs arise.   Expected Discharge Date:                  Expected Discharge Plan:  Home/Self Care  In-House Referral:  NA  Discharge planning Services  CM Consult  Post Acute Care Choice:  NA Choice offered to:  Patient, Spouse, Adult Children (Sons x 2 at bedside will assist with care if needed)  DME Arranged:  N/A DME Agency:     HH Arranged:  NA HH Agency:     Status of Service:  Completed, signed off  If discussed at [place] of Stay Meetings, dates discussed:    Additional Comments:  Delrae Sawyers, RN 09/25/2015, 10:32 AM

## 2015-09-25 NOTE — Progress Notes (Signed)
Patient sitting up in chair, no needs at this time. Call light within reach. 

## 2015-09-25 NOTE — ED Notes (Signed)
MD at bedside. 

## 2015-09-25 NOTE — Care Management Obs Status (Deleted)
[facility] NOTIFICATION   Patient Details  Name: Jason Fisher MRN: [id number] Date of Birth: 1940/05/05   Medicare Observation Status Notification Given:       Delrae Sawyers, RN 09/25/2015, 10:35 AM

## 2015-09-25 NOTE — Consult Note (Signed)
Date: 09/25/2015               Patient Name:  Jason Fisher MRN: [id number]  DOB: Jun 18, 1940 Age / Sex: 75 y.o., male   PCP: Marletta Lor, MD         Requesting Physician: Dr. Eugenie Filler, MD    Consulting Reason:  Chest pain     Chief Complaint: Chest pain, SOB and leg edema  History of Present Illness: Jason Fisher is a 75 y.o. male with medical history significant of hypertension, hyperlipidemia, diabetes mellitus, CAD, MI, s/p of stent, Afib on Eliquis, AAA, CKD-III, who presents with a shortness of breath, chest pain and leg edema. Pt. States having intermittent substernal,chest pain, pressure like about 5/10, non radiating associated with SOB and some palpitations for about two months. Occures with exertion and relieved with rest. He never took any medicine for this purpose. C/O having worsening fatigue and SOB for few months now, now has worsened to the point that he is unable to walk up one flight with out being short of breath, he sometimes becomes SOB even walking around the house.He also admits having orthopnea for about a week, he is recently sleeping on a recliner for about a week now. He noticed worsening bilateral leg swelling started on Sunday. He C/O cough since about a year but denies any fever,sputum production, chills or night sweats. He denies any diaphoresis,PND, N/V. He noticed some recent wt. Gain but denies any change in his appetite,urinary or bowel habits. Meds: Current Facility-Administered Medications  Medication Dose Route Frequency Provider Last Rate Last Dose  . 0.9 %  sodium chloride infusion  250 mL Intravenous PRN Ivor Costa, MD      . acetaminophen (TYLENOL) tablet 650 mg  650 mg Oral Q4H PRN Ivor Costa, MD      . apixaban Arne [facility]) tablet 5 mg  5 mg Oral BID Ivor Costa, MD   5 mg at 09/25/15 [id number]  . aspirin EC tablet 81 mg  81 mg Oral Daily Ivor Costa, MD      . atorvastatin (LIPITOR) tablet 20 mg  20 mg Oral q1800 Ivor Costa, MD      .  clopidogrel (PLAVIX) tablet 75 mg  75 mg Oral Daily Ivor Costa, MD   75 mg at 09/25/15 1013  . clotrimazole (LOTRIMIN) 1 % cream 1 application  1 application Topical BID Ivor Costa, MD   1 application at [id number] [phone]  . diltiazem (CARDIZEM CD) 24 hr capsule 120 mg  120 mg Oral Daily Ivor Costa, MD   120 mg at 09/25/15 1009  . furosemide (LASIX) injection 40 mg  40 mg Intravenous Q12H Ivor Costa, MD   40 mg at 09/25/15 0203  . HYDROcodone-acetaminophen (NORCO/VICODIN) 5-325 MG per tablet 1 tablet  1 tablet Oral Q6H PRN Ivor Costa, MD      . insulin aspart (novoLOG) injection 0-5 Units  0-5 Units Subcutaneous QHS Ivor Costa, MD      . insulin aspart (novoLOG) injection 0-9 Units  0-9 Units Subcutaneous TID WC Ivor Costa, MD   1 Units at 09/25/15 1202  . metoprolol tartrate (LOPRESSOR) tablet 100 mg  100 mg Oral BID Ivor Costa, MD   100 mg at 09/25/15 1009  . nicotine (NICODERM CQ - dosed in mg/24 hours) patch 21 mg  21 mg Transdermal Daily Ivor Costa, MD   21 mg at 09/25/15 1009  . nitroGLYCERIN (NITROSTAT) SL tablet 0.4 mg  0.4 mg Sublingual  Q5 min PRN Ivor Costa, MD      . ondansetron [facility]) injection 4 mg  4 mg Intravenous Q6H PRN Ivor Costa, MD      . polyethylene glycol (MIRALAX / GLYCOLAX) packet 17 g  17 g Oral BID PRN Ivor Costa, MD      . ramipril (ALTACE) capsule 10 mg  10 mg Oral Daily Ivor Costa, MD   10 mg at 09/25/15 1009  . sodium chloride flush (NS) 0.9 % injection 3 mL  3 mL Intravenous Q12H Ivor Costa, MD   3 mL at 09/25/15 1013  . sodium chloride flush (NS) 0.9 % injection 3 mL  3 mL Intravenous PRN Ivor Costa, MD        Allergies: Allergies as of 09/24/2015  . (No Known Allergies)   Past Medical History:  Diagnosis Date  . 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 Atrial fibrillation ([place]) 11/06/2009  . BENIGN PROSTATIC HYPERTROPHY, HX OF 02/24/2007  . 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 CHF (congestive heart failure) ([place])   . CKD (chronic kidney disease), stage III   . COLONIC  POLYPS, HX OF 11/02/2006  . CORONARY ARTERY DISEASE 11/02/2006  . DIABETES MELLITUS, TYPE II 10/29/2006  . Diverticulosis   . DYSPNEA ON EXERTION 07/07/2008  . HYPERLIPIDEMIA 10/29/2006  . HYPERTENSION 10/29/2006  . MYOCARDIAL INFARCTION, HX OF 11/02/2006  . TOBACCO USER 04/17/2010   Past Surgical History:  Procedure Laterality Date  . CATARACT EXTRACTION    . COLONOSCOPY    . CORONARY ANGIOPLASTY WITH STENT PLACEMENT    . SHOULDER SURGERY Right    Family History  Problem Relation Age of Onset  . Heart attack Mother   . Diabetes Mother   . Heart attack Father   . Hypertension Father   . Heart attack Brother   . Diabetes Brother    Social History   Social History  . Marital status: Married    Spouse name: N/A  . Number of children: 5  . Years of education: N/A   Occupational History  . [employer]    Social History Main Topics  . Smoking status: Current Some Day Smoker    Types: Cigarettes  . Smokeless tobacco: Never Used     Comment: passive smoker as well  . Alcohol use No  . Drug use: No  . Sexual activity: Not on file   Other Topics Concern  . Not on file   Social History Narrative  . No narrative on file    Review of Systems: Pertinent items are noted in HPI.  Physical Exam: Blood pressure (!) 147/70, pulse 86, temperature 97.6 F (36.4 C), temperature source Oral, resp. rate 18, height 5\' 8"  (1.727 m), weight 207 lb 4.8 oz (94 kg), SpO2 92 %. BP (!) 147/70   Pulse 86   Temp 97.6 F (36.4 C) (Oral)   Resp 18   Ht 5\' 8"  (1.727 m)   Wt 207 lb 4.8 oz (94 kg)   SpO2 92%   BMI 31.52 kg/m   General Appearance:    Alert, cooperative, no distress, appears stated age  Head:    Normocephalic, without obvious abnormality, atraumatic  Eyes:    PERRL, conjunctiva/corneas clear, EOM's intact, fundi    benign, both eyes          Nose:   Nares normal, septum midline, mucosa normal, no drainage    or sinus tenderness  Throat:   Lips, mucosa, and tongue normal;  teeth  and gums normal  Neck:   Supple, symmetrical, trachea midline,Positive JVD no adenopathy;   thyroid:  No enlargement/tenderness/nodules; no carotid   bruit   Back:     Symmetric, no curvature, ROM normal, no CVA tenderness  Lungs:     Clear to auscultation bilaterally, respirations unlabored  Chest wall:    No tenderness or deformity  Heart:   Irregular rhythm, no murmur, rub   or gallop  Abdomen:     Soft, non-tender, bowel sounds active all four quadrants,    no masses, no organomegaly        Extremities:   Bilateral pedal edema, no cyanosis   Pulses:   2+ and symmetric all extremities  Skin:   Skin color, texture, turgor normal, no rashes or lesions     Neurologic:   CNII-XII intact. Normal strength, sensation and reflexes      throughout     Lab results: CBC Latest Ref Rng & Units 09/24/2015 08/30/2015 03/07/2014  WBC 4.0 - 10.5 K/uL 6.6 7.5 8.4  Hemoglobin 13.0 - 17.0 g/dL 13.1 14.4 15.2  Hematocrit 39.0 - 52.0 % 41.8 43.1 44.9  Platelets 150 - 400 K/uL 148(L) 163.0 194.0   BMET    Component Value Date/Time   NA 140 09/24/2015 2038   K 3.6 09/24/2015 2038   CL 108 09/24/2015 2038   CO2 27 09/24/2015 2038   GLUCOSE 181 (H) 09/24/2015 2038   BUN 22 (H) 09/24/2015 2038   CREATININE 1.25 (H) 09/24/2015 2038   CALCIUM 9.3 09/24/2015 2038   GFRNONAA 55 (L) 09/24/2015 2038   GFRAA >60 09/24/2015 2038   Lipid Panel     Component Value Date/Time   CHOL 137 09/25/2015 0221   TRIG 64 09/25/2015 0221   HDL 47 09/25/2015 0221   CHOLHDL 2.9 09/25/2015 0221   VLDL 13 09/25/2015 0221   LDLCALC 77 09/25/2015 0221   Trop. <0.03    <0.03 BNP: 155.5 Mag. 2.1  Imaging results: Dg Chest 2 View  Result Date: 09/24/2015 CLINICAL DATA:  Shortness of breath. Chest pressure. Bilateral leg swelling for 1 week. Patient reports chronic cough. EXAM: CHEST  2 VIEW COMPARISON:  Chest radiograph 04/09/2005 FINDINGS: Heart at the upper limits normal in size. There is tortuosity and  atherosclerosis of the thoracic aorta. The lungs are hyperinflated with small bilateral pleural effusions, left greater than right. Associated bibasilar opacities are likely atelectasis. Minimal vascular congestion without alveolar edema. No focal consolidation. No pneumothorax. No acute osseous abnormalities are seen. There is degenerative change in the spine. IMPRESSION: 1. Borderline cardiomegaly with small pleural effusions and mild vascular congestion, suggesting mild fluid overload/CHF. 2. Hyperinflation suggests emphysema. Electronically Signed   By: Jeb Levering M.D.   On: 09/24/2015 21:24    Other results: EKG: Atrial fibrillation Cannot rule out Inferior infarct , age undetermined Non-specific ST-t changes  Assessment, Plan, & Recommendations by Problem:  75 y.o. male with medical history significant of hypertension, hyperlipidemia, diabetes mellitus, CAD, MI, s/p of stent, Afib on Eliquis, AAA, CKD-III, who presents with a shortness of breath, chest pain and leg edema. Chest pain and hx CAD: He has H/O 3 stents placement, 2 10 yrs ago and one 7 years ago.Not seeing a cardiologist currently.Although his ECG is not showing any acute change and trop. are negative but he his pain has characteristics of Angina. He has an h/o CAD with risk factors of HTN, DM,HPL,Smoker.We will do Cath. To rule out any ongoing ischemia and intervene accordingly.        -  Hold  Eliquis for Tomorrow's Cath.       - NPO after mid night  CHF; According to ECHO done in 12/2009, his EF was 50-55% without any evidence of heart failure.His current symptoms are suggestive of CHF(Leg edema,orthopnea, JVD,and pulmonary congestion on DG chest and higher BNP)      - Repeat ECHO     - Diurese him with IV Lasix 40 mg BID     - Hold his morning dose of Lasix for Cath.   Atrial Fibrillation: CHA2DS2-VASc Score is 4, Patient is on Eliquis at home. Heart rate is well controlled. continue Eliquis -continue metoprolol and  Cardizem  HTN: Blood pressure 147/70 -Continue ramipril, Cardizem -On IV Lasix as above  CKD-III; stable. Baseline creatinine 1.2-1.3. His creatinine is 1.25, BUN 22. -Follow-up renal function by BMP  Tobacco abuse: -Did counseling about importance of quitting smoking, didn't look like he is ready for quitting. -Nicotine patch  Patient was discussed with Dr. Isaiah Blakes planned according to his advice.  Dispo: Disposition is deferred at this time, awaiting improvement of current medical problems. Anticipated discharge in approximately 2-3 day(s).   The patient does have a current PCP Marletta Lor, MD) and does need an [facility] hospital follow-up appointment after discharge.  The patient does not have transportation limitations that hinder transportation to clinic appointments.  Signed: Lorella Nimrod, MD 09/25/2015, 1:27 PM

## 2015-09-25 NOTE — H&P (Signed)
History and Physical    Jason Fisher [id number] DOB: 03/16/1940 DOA: 09/25/2015  Referring MD/NP/PA:   PCP: Nyoka Cowden, MD   Patient coming from:  The patient is coming from home.  At baseline, pt is independent for most of ADL.    Chief Complaint: Shortness of breath, chest pain, leg edema  HPI: Jason Fisher is a 75 y.o. male with medical history significant of hypertension, hyperlipidemia, diabetes mellitus, CAD, MI, s/p of stent, Afib on Eliquis, AAA, CKD-III, who presents with a shortness of breath, chest pain and leg edema.  Patient reports that he has been having intermittent chest pain for about 2 month. The chest pain is located in the front chest, pressure-like, nonradiating, 5 out of 10 in severity. No tenderness over calf areas. Patient has cough with clear mucus production. In the past 2 days, he noted bilateral leg edema which is new for patient. No fever or chills. Patient denies nausea, vomiting, abdominal pain, symptoms of UTI, diarrhea, unilateral weakness.  ED Course: pt was found to have BNP 155.5, negative troponin, WBC 6.6, temperature normal, stable renal function. Chest x-ray showed vascular congestion consistent with CHF. Patient is placed on telemetry bed for observation.  Review of Systems:   General: no fevers, chills, no changes in body weight, has poor appetite, has fatigue HEENT: no blurry vision, hearing changes or sore throat Respiratory: has dyspnea, coughing, no wheezing CV: has chest pain, no palpitations GI: no nausea, vomiting, abdominal pain, diarrhea, constipation GU: no dysuria, burning on urination, increased urinary frequency, hematuria  Ext: has leg edema Neuro: no unilateral weakness, numbness, or tingling, no vision change or hearing loss Skin: no rash MSK: No muscle spasm, no deformity, no limitation of range of movement in spin Heme: No easy bruising.  Travel history: No recent long distant travel.  Allergy: No Known  Allergies  Past Medical History:  Diagnosis Date  . 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 Atrial fibrillation ([place]) 11/06/2009  . BENIGN PROSTATIC HYPERTROPHY, HX OF 02/24/2007  . 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 CKD (chronic kidney disease), stage III   . COLONIC POLYPS, HX OF 11/02/2006  . CORONARY ARTERY DISEASE 11/02/2006  . DIABETES MELLITUS, TYPE II 10/29/2006  . Diverticulosis   . DYSPNEA ON EXERTION 07/07/2008  . HYPERLIPIDEMIA 10/29/2006  . HYPERTENSION 10/29/2006  . MYOCARDIAL INFARCTION, HX OF 11/02/2006  . TOBACCO USER 04/17/2010    Past Surgical History:  Procedure Laterality Date  . CATARACT EXTRACTION    . COLONOSCOPY    . CORONARY ANGIOPLASTY WITH STENT PLACEMENT    . SHOULDER SURGERY Right     Social History:  reports that he has been smoking Cigarettes.  He has never used smokeless tobacco. He reports that he does not drink alcohol or use drugs.  Family History:  Family History  Problem Relation Age of Onset  . Heart attack Mother   . Diabetes Mother   . Heart attack Father   . Hypertension Father   . Heart attack Brother   . Diabetes Brother      Prior to Admission medications   Medication Sig Start Date End Date Taking? Authorizing Provider  ACCU-CHEK AVIVA PLUS test strip CHECK BLOOD SUGAR ONCE DAILY AS NEEDED 03/27/15   Marletta Lor, MD  apixaban (ELIQUIS) 5 MG TABS tablet Take 1 tablet (5 mg total) by mouth 2 (two) times daily. 01/21/15   Marletta Lor, MD  atorvastatin (LIPITOR) 20 MG tablet TAKE 1 TABLET  BY MOUTH EVERY DAY 03/12/15   Marletta Lor, MD  clopidogrel (PLAVIX) 75 MG tablet TAKE 1 TABLET BY MOUTH EVERY DAY 09/23/15   Marletta Lor, MD  clotrimazole (LOTRIMIN) 1 % cream Apply 1 application topically 2 (two) times daily. 07/16/14   Marletta Lor, MD  diltiazem (CARDIZEM CD) 120 MG 24 hr capsule Take 1 capsule (120 mg total) by mouth daily. 03/07/14   Marletta Lor, MD  diltiazem (CARDIZEM CD) 120 MG 24 hr  capsule TAKE 1 CAPSULE EVERY DAY 08/12/15   Marletta Lor, MD  diltiazem [facility] CD) 120 MG 24 hr capsule TAKE 1 CAPSULE EVERY DAY 09/23/15   Marletta Lor, MD  glimepiride (AMARYL) 4 MG tablet TAKE 1 TABLET BY MOUTH EVERY MORNING BEFORE BREAKFAST 08/12/15   Marletta Lor, MD  glimepiride (AMARYL) 4 MG tablet TAKE 1 TABLET BY MOUTH EVERY MORNING BEFORE BREAKFAST 09/23/15   Marletta Lor, MD  HYDROcodone-acetaminophen (NORCO/VICODIN) 5-325 MG tablet Take 1 tablet by mouth every 6 (six) hours as needed for moderate pain. 01/21/15   Marletta Lor, MD  Insulin Pen Needle 29G X 12.7MM MISC 1 each by Does not apply route 1 day or 1 dose. 01/06/12   Marletta Lor, MD  Lancets (ACCU-CHEK MULTICLIX) lancets Once daily 250.00 01/09/13   Marletta Lor, MD  metFORMIN (GLUCOPHAGE) 1000 MG tablet TAKE 1 TABLET TWICE DAILY WITH A MEAL 07/27/14   Marletta Lor, MD  metoprolol (LOPRESSOR) 100 MG tablet Take 1 tablet (100 mg total) by mouth 2 (two) times daily. 03/07/14   Marletta Lor, MD  metoprolol (LOPRESSOR) 100 MG tablet TAKE 1 TABLET TWICE DAILY 04/22/15   Marletta Lor, MD  NITROSTAT 0.4 MG SL tablet PLACE 1 TABLET UNDER TONGUE EVERY 5 MINUTES AS NEEDED 11/29/13   Marletta Lor, MD  pioglitazone (ACTOS) 45 MG tablet TAKE 1 TABLET BY MOUTH EVERY DAY 04/22/15   Marletta Lor, MD  polyethylene glycol powder (GLYCOLAX/MIRALAX) powder Take 17 g by mouth 2 (two) times daily as needed. 09/06/15   Marletta Lor, MD  ramipril (ALTACE) 10 MG capsule TAKE ONE CAPSULE BY MOUTH EVERY DAY 03/12/15   Marletta Lor, MD  sildenafil (VIAGRA) 100 MG tablet Take 0.5-1 tablets (50-100 mg total) by mouth daily as needed for erectile dysfunction. 12/10/11   Marletta Lor, MD  VICTOZA 18 MG/3ML SOPN INJECT 0.3MLS INTO SKIN DAILY 08/29/15   Marletta Lor, MD    Physical Exam: Vitals:   09/24/15 2015 09/24/15 2017 09/25/15 0118 09/25/15 0200  BP:  147/90  (!) 149/109 (!) 187/111  Pulse: 94  74 76  Resp: (!) 28  17 25   Temp: 98.1 F (36.7 C)     TempSrc: Oral     SpO2: 93%  96% 94%  Weight:  95.7 kg (211 lb)    Height:  5\' 8"  (1.727 m)     General: Not in acute distress HEENT:       Eyes: PERRL, EOMI, no scleral icterus.       ENT: No discharge from the ears and nose, no pharynx injection, no tonsillar enlargement.        Neck: positive JVD, no bruit, no mass felt. Heme: No neck lymph node enlargement. Cardiac: S1/S2, RRR, No murmurs, No gallops or rubs. Respiratory: No rales, wheezing, rhonchi or rubs. GI: Soft, nondistended, nontender, no rebound pain, no organomegaly, BS present. GU: No hematuria Ext: 2+ pitting leg  edema bilaterally. 2+DP/PT pulse bilaterally. Musculoskeletal: No joint deformities, No joint redness or warmth, no limitation of ROM in spin. Skin: No rashes.  Neuro: Alert, oriented X3, cranial nerves II-XII grossly intact, moves all extremities normally.  Psych: Patient is not psychotic, no suicidal or hemocidal ideation.  Labs on Admission: I have personally reviewed following labs and imaging studies  CBC:  Recent Labs Lab 09/24/15 2038  WBC 6.6  HGB 13.1  HCT 41.8  MCV 92.3  PLT 123456*   Basic Metabolic Panel:  Recent Labs Lab 09/24/15 2038  NA 140  K 3.6  CL 108  CO2 27  GLUCOSE 181*  BUN 22*  CREATININE 1.25*  CALCIUM 9.3   GFR: Estimated Creatinine Clearance: 58.2 mL/min (by C-G formula based on SCr of 1.25 mg/dL). Liver Function Tests: No results for input(s): AST, ALT, ALKPHOS, BILITOT, PROT, ALBUMIN in the last 168 hours. No results for input(s): LIPASE, AMYLASE in the last 168 hours. No results for input(s): AMMONIA in the last 168 hours. Coagulation Profile: No results for input(s): INR, PROTIME in the last 168 hours. Cardiac Enzymes: No results for input(s): CKTOTAL, CKMB, CKMBINDEX, TROPONINI in the last 168 hours. BNP (last 3 results) No results for input(s): PROBNP  in the last 8760 hours. HbA1C: No results for input(s): HGBA1C in the last 72 hours. CBG: No results for input(s): GLUCAP in the last 168 hours. Lipid Profile: No results for input(s): CHOL, HDL, LDLCALC, TRIG, CHOLHDL, LDLDIRECT in the last 72 hours. Thyroid Function Tests: No results for input(s): TSH, T4TOTAL, FREET4, T3FREE, THYROIDAB in the last 72 hours. Anemia Panel: No results for input(s): VITAMINB12, FOLATE, FERRITIN, TIBC, IRON, RETICCTPCT in the last 72 hours. Urine analysis:    Component Value Date/Time   COLORURINE YELLOW 01/14/2010 0629   APPEARANCEUR CLOUDY (A) 01/14/2010 0629   LABSPEC 1.024 01/14/2010 0629   PHURINE 7.0 01/14/2010 0629   GLUCOSEU 100 (A) 01/14/2010 0629   HGBUR LARGE (A) 01/14/2010 0629   HGBUR trace-intact 02/16/2007 1038   BILIRUBINUR n 08/30/2015 1045   KETONESUR NEGATIVE 01/14/2010 0629   PROTEINUR n 08/30/2015 1045   PROTEINUR NEGATIVE 01/14/2010 0629   UROBILINOGEN 1.0 08/30/2015 1045   UROBILINOGEN 1.0 01/14/2010 0629   NITRITE n 08/30/2015 1045   NITRITE NEGATIVE 01/14/2010 0629   LEUKOCYTESUR Negative 08/30/2015 1045   Sepsis Labs: @LABRCNTIP (procalcitonin:4,lacticidven:4) )No results found for this or any previous visit (from the past 240 hour(s)).   Radiological Exams on Admission: Dg Chest 2 View  Result Date: 09/24/2015 CLINICAL DATA:  Shortness of breath. Chest pressure. Bilateral leg swelling for 1 week. Patient reports chronic cough. EXAM: CHEST  2 VIEW COMPARISON:  Chest radiograph 04/09/2005 FINDINGS: Heart at the upper limits normal in size. There is tortuosity and atherosclerosis of the thoracic aorta. The lungs are hyperinflated with small bilateral pleural effusions, left greater than right. Associated bibasilar opacities are likely atelectasis. Minimal vascular congestion without alveolar edema. No focal consolidation. No pneumothorax. No acute osseous abnormalities are seen. There is degenerative change in the spine.  IMPRESSION: 1. Borderline cardiomegaly with small pleural effusions and mild vascular congestion, suggesting mild fluid overload/CHF. 2. Hyperinflation suggests emphysema. Electronically Signed   By: Jeb Levering M.D.   On: 09/24/2015 21:24     EKG: Independently reviewed. Atrial fibrillation, QTC 452, T-wave flattening.  Assessment/Plan Principal Problem:   CHF (congestive heart failure) (HCC) Active Problems:   DM (diabetes mellitus), type 2 with peripheral vascular complications (HCC)   Dyslipidemia   Essential hypertension  ATRIAL FIBRILLATION   Abdominal aortic aneurysm (HCC)   TOBACCO USER   Chest pain   SOB (shortness of breath)   CKD (chronic kidney disease), stage III   Possible new dCHF: Patient's SOB, positve JVD, leg edema, elevated BNP, plus CXR  findings of vascular congestion, consistent with CHF. Likely due to diastolic congestive heart failure. 2 echo on 12/25/09 showed EF of 50-55%.  -will place on tele bed for obs -Lasix 40 mg bid by IV -will continue home metoprolol -start ASA 81 mg dialy -continue ramipril -Daily weights -Low salt diet  Chest pain and hx CAD: s/p of stent. His CP is likely due to demanding ischemia secondary to CHF. Initial troponin negative. -trop x 3 -2d echo -Risk factor stratification: A1c, FLP -On aspirin, Lipitor, Plavix, metoprolol -prn nitroglycerin  DM-II: Last A1c 6.7 on 08/24/15, well controled. Patient is taking Victoza, Actos, metformin, Amaryl at home -SSI  HLD: Last LDL was 91 on 08/30/15 -Continue home medications: Lipitor  HTN: Blood pressure 149/109 -Continue ramipril, Cardizem -On IV Lasix as above  CKD-III; stable. Baseline creatinine 1.2-1.3. His creatinine is 1.25, BUN 22. -Follow-up renal function by BMP  Atrial Fibrillation: CHA2DS2-VASc Score is 4, needs oral anticoagulation. Patient is on Eliquis at home. Heart rate is well controlled. -continue Eliquis -continue metoprolol and Cardizem  Tobacco  abuse: -Did counseling about importance of quitting smoking -Nicotine patch   DVT ppx: On Eliquis Code Status: Full code Family Communication:  Yes, patient's wife at bed side Disposition Plan:  Anticipate discharge back to previous home environment Consults called:  none Admission status: Obs / tele   Date of Service 09/25/2015    Ivor Costa [facility] Pager [phone]  If 7PM-7AM, please contact night-coverage [email or web address] Password [email or web address] 09/25/2015, 2:20 AM

## 2015-09-25 NOTE — Consult Note (Signed)
   [facility] Inpatient Consult   09/25/2015  Jason Fisher Jan 13, 1941 [id number]   [facility] Care Management referral received from inpatient RNCM for newly dx CHF. Went to bedside to speak with [name] and family to discuss and explain [facility] Care Management services. [name] and family are agreeable and written consent obtained. Explained that [facility] will not interfere or replace services provided by home health. Explained that he will receive post hospital transition of care calls and will be evaluated for monthly home visits. Discussed primary focus will be CHF disease management and education. Mrs. Reznik, with patient's consent, states she should be called for post transition of care calls. Best contact number is [phone]. Both deny having issues with obtaining medications or with transportation. Primary Care MD is Dr. Burnice Logan. [facility] Care Management packet and contact information provided. Appreciative of visit. Made inpatient RNCMs aware that [name] will be followed by [facility] post discharge.  Will request to be assigned to [facility] for CHF disease management and education. [name] also has history of HLD, DM, CAD, MI, HTN, Afib.  Demico Drewett (wife) to be contacted for post discharge calls at [phone].   Marthenia Rolling, MSN-Ed, RN,BSN [facility] Liaison [phone]

## 2015-09-25 NOTE — ED Provider Notes (Addendum)
TIME SEEN: 1:24 AM  CHIEF COMPLAINT: Chest Pain; Leg Swelling  HPI: Jason Fisher is a 75 y.o. male with a medical hx of A-fib on Eliquis, DM, HTN, MI, hyperlipidemia, CAD s/p 3 stents, and AAA who presents to the Emergency Department complaining of intermittent central CP onset 2 months. He describes his CP as central chest tightness and non-radiating. Pt states that his CP is worsened with ambulation. Pt denies any alleviating factors of his CP. He states that he is having associated symptoms of SOB, cough, BLE swelling. He states that he has not tried any medications for the relief for his symptoms. He denies n/v, diaphoresis, dizziness, and any other symptoms. Pt PCP is at [facility] and cardiologist is Dr. Martinique and he has a follow up appointment on 09/30/15. Pt states that he has had 3 cardiac stents placed with his last cardiac catheterization being 7 years ago. Pt reports that he takes eliquis and plavix. Pt denies recent travel, immobilization, surgery, PE, or DVT. Has had a productive cough. No fever.     Feet swelling x Sunday.  ROS: See HPI Constitutional: no fever  Eyes: no drainage  ENT: no runny nose   Cardiovascular:  +chest pain  Resp: +SOB  GI: no vomiting GU: no dysuria Integumentary: no rash  Allergy: no hives  Musculoskeletal: + bilateral lower leg swelling  Neurological: no slurred speech ROS otherwise negative  PAST MEDICAL HISTORY/PAST SURGICAL HISTORY:  Past Medical History:  Diagnosis Date  . 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 Atrial fibrillation ([place]) 11/06/2009  . BENIGN PROSTATIC HYPERTROPHY, HX OF 02/24/2007  . 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 COLONIC POLYPS, HX OF 11/02/2006  . CORONARY ARTERY DISEASE 11/02/2006  . DIABETES MELLITUS, TYPE II 10/29/2006  . Diverticulosis   . DYSPNEA ON EXERTION 07/07/2008  . HYPERLIPIDEMIA 10/29/2006  . HYPERTENSION 10/29/2006  . MYOCARDIAL INFARCTION, HX OF 11/02/2006  . TOBACCO USER 04/17/2010    MEDICATIONS:   Prior to Admission medications   Medication Sig Start Date End Date Taking? Authorizing Provider  ACCU-CHEK AVIVA PLUS test strip CHECK BLOOD SUGAR ONCE DAILY AS NEEDED 03/27/15   Marletta Lor, MD  apixaban (ELIQUIS) 5 MG TABS tablet Take 1 tablet (5 mg total) by mouth 2 (two) times daily. 01/21/15   Marletta Lor, MD  atorvastatin (LIPITOR) 20 MG tablet TAKE 1 TABLET BY MOUTH EVERY DAY 03/12/15   Marletta Lor, MD  clopidogrel (PLAVIX) 75 MG tablet TAKE 1 TABLET BY MOUTH EVERY DAY 09/23/15   Marletta Lor, MD  clotrimazole (LOTRIMIN) 1 % cream Apply 1 application topically 2 (two) times daily. 07/16/14   Marletta Lor, MD  diltiazem (CARDIZEM CD) 120 MG 24 hr capsule Take 1 capsule (120 mg total) by mouth daily. 03/07/14   Marletta Lor, MD  diltiazem (CARDIZEM CD) 120 MG 24 hr capsule TAKE 1 CAPSULE EVERY DAY 08/12/15   Marletta Lor, MD  diltiazem [facility] CD) 120 MG 24 hr capsule TAKE 1 CAPSULE EVERY DAY 09/23/15   Marletta Lor, MD  glimepiride (AMARYL) 4 MG tablet TAKE 1 TABLET BY MOUTH EVERY MORNING BEFORE BREAKFAST 08/12/15   Marletta Lor, MD  glimepiride (AMARYL) 4 MG tablet TAKE 1 TABLET BY MOUTH EVERY MORNING BEFORE BREAKFAST 09/23/15   Marletta Lor, MD  HYDROcodone-acetaminophen (NORCO/VICODIN) 5-325 MG tablet Take 1 tablet by mouth every 6 (six) hours as needed for moderate pain. 01/21/15   Marletta Lor, MD  Insulin  Pen Needle 29G X 12.7MM MISC 1 each by Does not apply route 1 day or 1 dose. 01/06/12   Marletta Lor, MD  Lancets (ACCU-CHEK MULTICLIX) lancets Once daily 250.00 01/09/13   Marletta Lor, MD  metFORMIN (GLUCOPHAGE) 1000 MG tablet TAKE 1 TABLET TWICE DAILY WITH A MEAL 07/27/14   Marletta Lor, MD  metoprolol (LOPRESSOR) 100 MG tablet Take 1 tablet (100 mg total) by mouth 2 (two) times daily. 03/07/14   Marletta Lor, MD  metoprolol (LOPRESSOR) 100 MG tablet TAKE 1 TABLET TWICE DAILY 04/22/15    Marletta Lor, MD  NITROSTAT 0.4 MG SL tablet PLACE 1 TABLET UNDER TONGUE EVERY 5 MINUTES AS NEEDED 11/29/13   Marletta Lor, MD  pioglitazone (ACTOS) 45 MG tablet TAKE 1 TABLET BY MOUTH EVERY DAY 04/22/15   Marletta Lor, MD  polyethylene glycol powder (GLYCOLAX/MIRALAX) powder Take 17 g by mouth 2 (two) times daily as needed. 09/06/15   Marletta Lor, MD  ramipril (ALTACE) 10 MG capsule TAKE ONE CAPSULE BY MOUTH EVERY DAY 03/12/15   Marletta Lor, MD  sildenafil (VIAGRA) 100 MG tablet Take 0.5-1 tablets (50-100 mg total) by mouth daily as needed for erectile dysfunction. 12/10/11   Marletta Lor, MD  VICTOZA 18 MG/3ML SOPN INJECT 0.3MLS INTO SKIN DAILY 08/29/15   Marletta Lor, MD    ALLERGIES:  No Known Allergies  SOCIAL HISTORY:  Social History  Substance Use Topics  . Smoking status: Current Some Day Smoker    Types: Cigarettes  . Smokeless tobacco: Never Used     Comment: passive smoker as well  . Alcohol use No    FAMILY HISTORY: Family History  Problem Relation Age of Onset  . Heart attack Mother   . Diabetes Mother   . Heart attack Father   . Hypertension Father   . Heart attack Brother   . Diabetes Brother     EXAM: BP (!) 149/109   Pulse 74   Temp 98.1 F (36.7 C) (Oral)   Resp 17   Ht 5\' 8"  (1.727 m)   Wt 211 lb (95.7 kg)   SpO2 96%   BMI 32.08 kg/m  CONSTITUTIONAL: Elderly, chronically ill appearing. Alert and oriented and responds appropriately to questions. well-nourished HEAD: Normocephalic EYES: Conjunctivae clear, PERRL ENT: normal nose; no rhinorrhea; moist mucous membranes NECK: Supple, no meningismus, no LAD  CARD: RRR; S1 and S2 appreciated; no murmurs, no clicks, no rubs, no gallops RESP: Normal chest excursion without splinting or tachypnea; no wheezes, no rhonchi, no hypoxia or respiratory distress, speaking full sentences. Bibasilar crackles.  ABD/GI: Normal bowel sounds; non-distended; soft, non-tender,  no rebound, no guarding, no peritoneal signs BACK:  The back appears normal and is non-tender to palpation, there is no CVA tenderness EXT: Normal ROM in all joints; non-tender to palpation; normal capillary refill; no cyanosis, no calf tenderness. Bilateral 2+ pitting edema to mid-shin . 2+ dp pulses bilaterally.  SKIN: Normal color for age and race; warm; no rash NEURO: Moves all extremities equally, sensation to light touch intact diffusely, cranial nerves II through XII intact PSYCH: The patient's mood and manner are appropriate. Grooming and personal hygiene are appropriate.  MEDICAL DECISION MAKING: Patient here with chest pain that is worse with exertion and now coming on at rest. Also having shortness of breath and now bilateral lower external swelling. Has bibasilar crackles on exam and lower extremity edema. He does appear volume overloaded. Chest x-ray  shows mild pulmonary edema and BNP is mildly elevated. Troponin is negative and EKG shows no new ischemic abnormality. We'll give IV Lasix, aspirin. He is currently chest pain-free. I feel he will need admission for chest pain rule out diuresis. He is comfortable with this plan. We'll discuss with hospitalist on-call.  ED PROGRESS: 1:50 AM  Discussed patient's case with hospitalist, Dr. Blaine Hamper.  Recommend admission to telemetry, observation bed.  I will place holding orders per their request. Patient and family (if present) updated with plan. Care transferred to hospitalist service.  I reviewed all nursing notes, vitals, pertinent old records, EKGs, labs, imaging (as available).    EKG Interpretation  Date/Time:  Tuesday September 24 2015 20:12:42 EDT Ventricular Rate:  80 PR Interval:    QRS Duration: 92 QT Interval:  392 QTC Calculation: 452 R Axis:   58 Text Interpretation:  Atrial fibrillation Cannot rule out Anterior infarct , age undetermined Abnormal ECG No significant change since last tracing Confirmed by Gurley Climer,  DO, Jo-Anne Kluth ([id number])  on 09/25/2015 12:52:40 AM        I personally performed the services described in this documentation, which was scribed in my presence. The recorded information has been reviewed and is accurate.     [place], DO 09/25/15 0200    Pt is CP free.  No abd pain or back pain,  Doubt worsening aneurysm or dissection.   Loma Mar, DO 09/25/15 0207

## 2015-09-25 NOTE — Discharge Instructions (Signed)

## 2015-09-26 ENCOUNTER — Inpatient Hospital Stay (HOSPITAL_COMMUNITY): Payer: Medicare Other

## 2015-09-26 ENCOUNTER — Other Ambulatory Visit (HOSPITAL_COMMUNITY): Payer: Medicare Other

## 2015-09-26 ENCOUNTER — Encounter (HOSPITAL_COMMUNITY): Payer: Self-pay | Admitting: Cardiology

## 2015-09-26 ENCOUNTER — Inpatient Hospital Stay (HOSPITAL_COMMUNITY)
Admission: EM | Disposition: A | Discharge status: Home Health | Payer: Self-pay | Source: Home / Self Care | Attending: Internal Medicine

## 2015-09-26 DIAGNOSIS — I5030 Unspecified diastolic (congestive) heart failure: Secondary | ICD-10-CM | POA: Diagnosis not present

## 2015-09-26 DIAGNOSIS — Z794 Long term (current) use of insulin: Secondary | ICD-10-CM | POA: Diagnosis not present

## 2015-09-26 DIAGNOSIS — E1151 Type 2 diabetes mellitus with diabetic peripheral angiopathy without gangrene: Secondary | ICD-10-CM | POA: Diagnosis not present

## 2015-09-26 DIAGNOSIS — J9801 Acute bronchospasm: Secondary | ICD-10-CM | POA: Diagnosis not present

## 2015-09-26 DIAGNOSIS — I2510 Atherosclerotic heart disease of native coronary artery without angina pectoris: Secondary | ICD-10-CM | POA: Diagnosis not present

## 2015-09-26 DIAGNOSIS — F17210 Nicotine dependence, cigarettes, uncomplicated: Secondary | ICD-10-CM | POA: Diagnosis present

## 2015-09-26 DIAGNOSIS — Z7901 Long term (current) use of anticoagulants: Secondary | ICD-10-CM | POA: Diagnosis not present

## 2015-09-26 DIAGNOSIS — R079 Chest pain, unspecified: Secondary | ICD-10-CM | POA: Diagnosis not present

## 2015-09-26 DIAGNOSIS — I200 Unstable angina: Secondary | ICD-10-CM | POA: Diagnosis present

## 2015-09-26 DIAGNOSIS — Y92238 Other place in hospital as the place of occurrence of the external cause: Secondary | ICD-10-CM | POA: Diagnosis not present

## 2015-09-26 DIAGNOSIS — J189 Pneumonia, unspecified organism: Secondary | ICD-10-CM | POA: Diagnosis not present

## 2015-09-26 DIAGNOSIS — Z955 Presence of coronary angioplasty implant and graft: Secondary | ICD-10-CM | POA: Diagnosis not present

## 2015-09-26 DIAGNOSIS — I509 Heart failure, unspecified: Secondary | ICD-10-CM | POA: Diagnosis not present

## 2015-09-26 DIAGNOSIS — T508X5A Adverse effect of diagnostic agents, initial encounter: Secondary | ICD-10-CM | POA: Diagnosis not present

## 2015-09-26 DIAGNOSIS — Z8249 Family history of ischemic heart disease and other diseases of the circulatory system: Secondary | ICD-10-CM | POA: Diagnosis not present

## 2015-09-26 DIAGNOSIS — I25110 Atherosclerotic heart disease of native coronary artery with unstable angina pectoris: Secondary | ICD-10-CM | POA: Diagnosis not present

## 2015-09-26 DIAGNOSIS — I482 Chronic atrial fibrillation: Secondary | ICD-10-CM | POA: Diagnosis not present

## 2015-09-26 DIAGNOSIS — Z7902 Long term (current) use of antithrombotics/antiplatelets: Secondary | ICD-10-CM | POA: Diagnosis not present

## 2015-09-26 DIAGNOSIS — T380X5A Adverse effect of glucocorticoids and synthetic analogues, initial encounter: Secondary | ICD-10-CM | POA: Diagnosis not present

## 2015-09-26 DIAGNOSIS — I110 Hypertensive heart disease with heart failure: Secondary | ICD-10-CM | POA: Diagnosis not present

## 2015-09-26 DIAGNOSIS — I10 Essential (primary) hypertension: Secondary | ICD-10-CM | POA: Diagnosis not present

## 2015-09-26 DIAGNOSIS — I480 Paroxysmal atrial fibrillation: Secondary | ICD-10-CM | POA: Diagnosis not present

## 2015-09-26 DIAGNOSIS — J9601 Acute respiratory failure with hypoxia: Secondary | ICD-10-CM | POA: Diagnosis not present

## 2015-09-26 DIAGNOSIS — I130 Hypertensive heart and chronic kidney disease with heart failure and stage 1 through stage 4 chronic kidney disease, or unspecified chronic kidney disease: Secondary | ICD-10-CM | POA: Diagnosis present

## 2015-09-26 DIAGNOSIS — T7849XD Other allergy, subsequent encounter: Secondary | ICD-10-CM | POA: Diagnosis not present

## 2015-09-26 DIAGNOSIS — E1122 Type 2 diabetes mellitus with diabetic chronic kidney disease: Secondary | ICD-10-CM | POA: Diagnosis present

## 2015-09-26 DIAGNOSIS — T7849XA Other allergy, initial encounter: Secondary | ICD-10-CM | POA: Diagnosis not present

## 2015-09-26 DIAGNOSIS — Y95 Nosocomial condition: Secondary | ICD-10-CM | POA: Diagnosis not present

## 2015-09-26 DIAGNOSIS — I714 Abdominal aortic aneurysm, without rupture: Secondary | ICD-10-CM | POA: Diagnosis present

## 2015-09-26 DIAGNOSIS — I5043 Acute on chronic combined systolic (congestive) and diastolic (congestive) heart failure: Secondary | ICD-10-CM | POA: Diagnosis not present

## 2015-09-26 DIAGNOSIS — N183 Chronic kidney disease, stage 3 (moderate): Secondary | ICD-10-CM | POA: Diagnosis not present

## 2015-09-26 DIAGNOSIS — I252 Old myocardial infarction: Secondary | ICD-10-CM | POA: Diagnosis not present

## 2015-09-26 DIAGNOSIS — J440 Chronic obstructive pulmonary disease with acute lower respiratory infection: Secondary | ICD-10-CM | POA: Diagnosis not present

## 2015-09-26 DIAGNOSIS — E785 Hyperlipidemia, unspecified: Secondary | ICD-10-CM | POA: Diagnosis present

## 2015-09-26 DIAGNOSIS — Z833 Family history of diabetes mellitus: Secondary | ICD-10-CM | POA: Diagnosis not present

## 2015-09-26 DIAGNOSIS — R0602 Shortness of breath: Secondary | ICD-10-CM | POA: Diagnosis not present

## 2015-09-26 DIAGNOSIS — R05 Cough: Secondary | ICD-10-CM | POA: Diagnosis not present

## 2015-09-26 HISTORY — PX: CARDIAC CATHETERIZATION: SHX172

## 2015-09-26 LAB — BASIC METABOLIC PANEL
Anion gap: 7 (ref 5–15)
BUN: 21 mg/dL — AB (ref 6–20)
CALCIUM: 9.2 mg/dL (ref 8.9–10.3)
CO2: 29 mmol/L (ref 22–32)
CREATININE: 1.28 mg/dL — AB (ref 0.61–1.24)
Chloride: 106 mmol/L (ref 101–111)
GFR calc Af Amer: >60 mL/min (ref >60)
GFR, EST NON AFRICAN AMERICAN: 53 mL/min — AB (ref >60)
GLUCOSE: 102 mg/dL — AB (ref 65–99)
Potassium: 4 mmol/L (ref 3.5–5.1)
SODIUM: 142 mmol/L (ref 135–145)

## 2015-09-26 LAB — GLUCOSE, CAPILLARY
GLUCOSE-CAPILLARY: 122 mg/dL — AB (ref 65–99)
Glucose-Capillary: 170 mg/dL — ABNORMAL HIGH (ref 65–99)
Glucose-Capillary: 204 mg/dL — ABNORMAL HIGH (ref 65–99)
Glucose-Capillary: 247 mg/dL — ABNORMAL HIGH (ref 65–99)

## 2015-09-26 LAB — HEMOGLOBIN A1C
Hgb A1c MFr Bld: 6.7 % — ABNORMAL HIGH (ref 4.8–5.6)
Mean Plasma Glucose: 146 mg/dL

## 2015-09-26 LAB — CBC
HEMATOCRIT: 42.1 % (ref 39.0–52.0)
Hemoglobin: 13.2 g/dL (ref 13.0–17.0)
MCH: 29 pg (ref 26.0–34.0)
MCHC: 31.4 g/dL (ref 30.0–36.0)
MCV: 92.5 fL (ref 78.0–100.0)
PLATELETS: 143 10*3/uL — AB (ref 150–400)
RBC: 4.55 MIL/uL (ref 4.22–5.81)
RDW: 14.5 % (ref 11.5–15.5)
WBC: 7.2 10*3/uL (ref 4.0–10.5)

## 2015-09-26 LAB — MAGNESIUM: MAGNESIUM: 2.2 mg/dL (ref 1.7–2.4)

## 2015-09-26 LAB — POCT ACTIVATED CLOTTING TIME: Activated Clotting Time: 252 s

## 2015-09-26 LAB — TROPONIN I: Troponin I: <0.03 ng/mL (ref <0.03)

## 2015-09-26 LAB — MRSA PCR SCREENING: MRSA BY PCR: NEGATIVE

## 2015-09-26 SURGERY — LEFT HEART CATH AND CORONARY ANGIOGRAPHY

## 2015-09-26 MED ORDER — LIDOCAINE HCL (PF) 1 % IJ SOLN
INTRAMUSCULAR | Status: DC | PRN
  Administered 2015-09-26: 2 mL via INTRADERMAL

## 2015-09-26 MED ORDER — MIDAZOLAM HCL 2 MG/2ML IJ SOLN
INTRAMUSCULAR | Status: DC | PRN
Start: 2015-09-26 — End: 2015-09-26
  Administered 2015-09-26: 1 mg via INTRAVENOUS

## 2015-09-26 MED ORDER — FAMOTIDINE IN NACL 20-0.9 MG/50ML-% IV SOLN
20.0000 mg | Freq: Two times a day (BID) | INTRAVENOUS | Status: DC
  Administered 2015-09-26 – 2015-09-27 (×3): 20 mg via INTRAVENOUS
  Filled 2015-09-26 (×3): qty 50

## 2015-09-26 MED ORDER — HEPARIN SODIUM (PORCINE) 1000 UNIT/ML IJ SOLN
INTRAMUSCULAR | Status: DC | PRN
  Administered 2015-09-26: 2000 [IU] via INTRAVENOUS
  Administered 2015-09-26: 5000 [IU] via INTRAVENOUS
  Administered 2015-09-26: 3000 [IU] via INTRAVENOUS

## 2015-09-26 MED ORDER — CLOPIDOGREL BISULFATE 75 MG PO TABS
75.0000 mg | ORAL_TABLET | Freq: Every day | ORAL | Status: DC
  Administered 2015-09-27 – 2015-10-03 (×7): 75 mg via ORAL
  Filled 2015-09-26 (×7): qty 1

## 2015-09-26 MED ORDER — IOPAMIDOL (ISOVUE-370) INJECTION 76%
INTRAVENOUS | Status: AC
  Filled 2015-09-26: qty 100

## 2015-09-26 MED ORDER — APIXABAN 5 MG PO TABS
5.0000 mg | ORAL_TABLET | Freq: Two times a day (BID) | ORAL | Status: DC
  Administered 2015-09-27 – 2015-10-03 (×13): 5 mg via ORAL
  Filled 2015-09-26 (×13): qty 1

## 2015-09-26 MED ORDER — SODIUM CHLORIDE 0.9% FLUSH: 3.0000 mL | INTRAVENOUS | Status: DC | PRN

## 2015-09-26 MED ORDER — VERAPAMIL HCL 2.5 MG/ML IV SOLN
INTRAVENOUS | Status: AC
  Filled 2015-09-26: qty 2

## 2015-09-26 MED ORDER — FUROSEMIDE 10 MG/ML IJ SOLN
INTRAMUSCULAR | Status: AC
  Filled 2015-09-26: qty 4

## 2015-09-26 MED ORDER — METHYLPREDNISOLONE SODIUM SUCC 125 MG IJ SOLR
INTRAMUSCULAR | Status: DC | PRN
  Administered 2015-09-26: 125 mg via INTRAVENOUS

## 2015-09-26 MED ORDER — IOPAMIDOL (ISOVUE-370) INJECTION 76%
INTRAVENOUS | Status: DC | PRN
  Administered 2015-09-26: 195 mL via INTRAVENOUS

## 2015-09-26 MED ORDER — HYDRALAZINE HCL 20 MG/ML IJ SOLN: 10.0000 mg | Freq: Four times a day (QID) | INTRAMUSCULAR | Status: DC | PRN

## 2015-09-26 MED ORDER — IPRATROPIUM BROMIDE 0.02 % IN SOLN: 0.5000 mg | RESPIRATORY_TRACT | Status: DC | PRN

## 2015-09-26 MED ORDER — LEVALBUTEROL HCL 0.63 MG/3ML IN NEBU
0.6300 mg | INHALATION_SOLUTION | Freq: Four times a day (QID) | RESPIRATORY_TRACT | Status: DC
  Administered 2015-09-26 – 2015-09-27 (×5): 0.63 mg via RESPIRATORY_TRACT
  Filled 2015-09-26 (×6): qty 3

## 2015-09-26 MED ORDER — SODIUM CHLORIDE 0.9 % WEIGHT BASED INFUSION: 1.0000 mL/kg/h | INTRAVENOUS | Status: AC

## 2015-09-26 MED ORDER — ALBUTEROL SULFATE (2.5 MG/3ML) 0.083% IN NEBU
2.5000 mg | INHALATION_SOLUTION | Freq: Once | RESPIRATORY_TRACT | Status: AC
  Administered 2015-09-26: 2.5 mg via RESPIRATORY_TRACT

## 2015-09-26 MED ORDER — LIDOCAINE HCL (PF) 1 % IJ SOLN
INTRAMUSCULAR | Status: AC
  Filled 2015-09-26: qty 30

## 2015-09-26 MED ORDER — HEPARIN (PORCINE) IN NACL 2-0.9 UNIT/ML-% IJ SOLN
INTRAMUSCULAR | Status: AC
  Filled 2015-09-26: qty 1000

## 2015-09-26 MED ORDER — DIPHENHYDRAMINE HCL 50 MG/ML IJ SOLN
INTRAMUSCULAR | Status: AC
  Filled 2015-09-26: qty 1

## 2015-09-26 MED ORDER — FAMOTIDINE IN NACL 20-0.9 MG/50ML-% IV SOLN
INTRAVENOUS | Status: AC
Start: 2015-09-26 — End: 2015-09-26
  Filled 2015-09-26: qty 50

## 2015-09-26 MED ORDER — MIDAZOLAM HCL 2 MG/2ML IJ SOLN
INTRAMUSCULAR | Status: AC
  Filled 2015-09-26: qty 2

## 2015-09-26 MED ORDER — ARFORMOTEROL TARTRATE 15 MCG/2ML IN NEBU
15.0000 ug | INHALATION_SOLUTION | Freq: Two times a day (BID) | RESPIRATORY_TRACT | Status: DC
  Administered 2015-09-26 – 2015-10-02 (×9): 15 ug via RESPIRATORY_TRACT
  Filled 2015-09-26 (×10): qty 2

## 2015-09-26 MED ORDER — SODIUM CHLORIDE 0.9% FLUSH
3.0000 mL | Freq: Two times a day (BID) | INTRAVENOUS | Status: DC
  Administered 2015-09-26 – 2015-10-03 (×14): 3 mL via INTRAVENOUS

## 2015-09-26 MED ORDER — LEVALBUTEROL HCL 0.63 MG/3ML IN NEBU: 0.6300 mg | INHALATION_SOLUTION | RESPIRATORY_TRACT | Status: DC | PRN

## 2015-09-26 MED ORDER — FLUTICASONE PROPIONATE 50 MCG/ACT NA SUSP
2.0000 | Freq: Every day | NASAL | Status: DC
  Administered 2015-09-27 – 2015-10-03 (×7): 2 via NASAL
  Filled 2015-09-26 (×2): qty 16

## 2015-09-26 MED ORDER — IOPAMIDOL (ISOVUE-370) INJECTION 76%
INTRAVENOUS | Status: AC
  Filled 2015-09-26: qty 50

## 2015-09-26 MED ORDER — BUDESONIDE 0.25 MG/2ML IN SUSP
0.2500 mg | Freq: Two times a day (BID) | RESPIRATORY_TRACT | Status: DC
  Administered 2015-09-27 – 2015-10-02 (×8): 0.25 mg via RESPIRATORY_TRACT
  Filled 2015-09-26 (×10): qty 2

## 2015-09-26 MED ORDER — DIPHENHYDRAMINE HCL 50 MG/ML IJ SOLN
25.0000 mg | Freq: Four times a day (QID) | INTRAMUSCULAR | Status: DC | PRN
  Filled 2015-09-26: qty 0.5

## 2015-09-26 MED ORDER — METHYLPREDNISOLONE SODIUM SUCC 125 MG IJ SOLR
80.0000 mg | Freq: Four times a day (QID) | INTRAMUSCULAR | Status: DC
  Administered 2015-09-26 – 2015-09-27 (×3): 80 mg via INTRAVENOUS
  Filled 2015-09-26 (×3): qty 2

## 2015-09-26 MED ORDER — FUROSEMIDE 10 MG/ML IJ SOLN
60.0000 mg | Freq: Two times a day (BID) | INTRAMUSCULAR | Status: DC
  Administered 2015-09-26: 60 mg via INTRAVENOUS
  Filled 2015-09-26 (×3): qty 6

## 2015-09-26 MED ORDER — HYDRALAZINE HCL 20 MG/ML IJ SOLN
INTRAMUSCULAR | Status: DC | PRN
  Administered 2015-09-26 (×2): 10 mg via INTRAVENOUS

## 2015-09-26 MED ORDER — METHYLPREDNISOLONE SODIUM SUCC 125 MG IJ SOLR
INTRAMUSCULAR | Status: AC
  Filled 2015-09-26: qty 2

## 2015-09-26 MED ORDER — HEPARIN SODIUM (PORCINE) 1000 UNIT/ML IJ SOLN
INTRAMUSCULAR | Status: AC
  Filled 2015-09-26: qty 1

## 2015-09-26 MED ORDER — VERAPAMIL HCL 2.5 MG/ML IV SOLN
INTRAVENOUS | Status: DC | PRN
  Administered 2015-09-26: 10 mL via INTRA_ARTERIAL

## 2015-09-26 MED ORDER — HYDRALAZINE HCL 20 MG/ML IJ SOLN
INTRAMUSCULAR | Status: AC
  Filled 2015-09-26: qty 1

## 2015-09-26 MED ORDER — SODIUM CHLORIDE 0.9 % IV SOLN: 250.0000 mL | INTRAVENOUS | Status: DC | PRN

## 2015-09-26 MED ORDER — FUROSEMIDE 10 MG/ML IJ SOLN
INTRAMUSCULAR | Status: DC | PRN
  Administered 2015-09-26: 40 mg via INTRAVENOUS

## 2015-09-26 MED ORDER — CLOPIDOGREL BISULFATE 75 MG PO TABS: 75.0000 mg | ORAL_TABLET | Freq: Every day | ORAL | Status: DC

## 2015-09-26 MED ORDER — IPRATROPIUM BROMIDE 0.02 % IN SOLN
0.5000 mg | Freq: Four times a day (QID) | RESPIRATORY_TRACT | Status: DC
  Administered 2015-09-26 – 2015-09-27 (×5): 0.5 mg via RESPIRATORY_TRACT
  Filled 2015-09-26 (×6): qty 2.5

## 2015-09-26 MED ORDER — NITROGLYCERIN 1 MG/10 ML FOR IR/CATH LAB
INTRA_ARTERIAL | Status: AC
  Filled 2015-09-26: qty 10

## 2015-09-26 MED ORDER — METHYLPREDNISOLONE SODIUM SUCC 125 MG IJ SOLR: 125.0000 mg | Freq: Four times a day (QID) | INTRAMUSCULAR | Status: DC

## 2015-09-26 MED ORDER — ALBUTEROL SULFATE (2.5 MG/3ML) 0.083% IN NEBU
INHALATION_SOLUTION | RESPIRATORY_TRACT | Status: AC
  Filled 2015-09-26: qty 3

## 2015-09-26 MED ORDER — DIPHENHYDRAMINE HCL 25 MG PO CAPS
25.0000 mg | ORAL_CAPSULE | Freq: Three times a day (TID) | ORAL | Status: DC
  Administered 2015-09-26 – 2015-09-27 (×3): 25 mg via ORAL
  Filled 2015-09-26 (×3): qty 1

## 2015-09-26 MED ORDER — ASPIRIN 81 MG PO CHEW
81.0000 mg | CHEWABLE_TABLET | Freq: Every day | ORAL | Status: DC
  Administered 2015-09-27 – 2015-10-03 (×7): 81 mg via ORAL
  Filled 2015-09-26 (×7): qty 1

## 2015-09-26 MED ORDER — FENTANYL CITRATE (PF) 100 MCG/2ML IJ SOLN
INTRAMUSCULAR | Status: AC
  Filled 2015-09-26: qty 2

## 2015-09-26 MED ORDER — HEPARIN (PORCINE) IN NACL 2-0.9 UNIT/ML-% IJ SOLN
INTRAMUSCULAR | Status: DC | PRN
  Administered 2015-09-26: 1000 mL

## 2015-09-26 MED ORDER — NITROGLYCERIN 1 MG/10 ML FOR IR/CATH LAB
INTRA_ARTERIAL | Status: DC | PRN
  Administered 2015-09-26: 200 ug via INTRACORONARY

## 2015-09-26 MED ORDER — NITROGLYCERIN IN D5W 200-5 MCG/ML-% IV SOLN
0.0000 ug/min | INTRAVENOUS | Status: DC
  Filled 2015-09-26: qty 250

## 2015-09-26 MED ORDER — FENTANYL CITRATE (PF) 100 MCG/2ML IJ SOLN
INTRAMUSCULAR | Status: DC | PRN
  Administered 2015-09-26: 25 ug via INTRAVENOUS

## 2015-09-26 MED ORDER — FAMOTIDINE IN NACL 20-0.9 MG/50ML-% IV SOLN
INTRAVENOUS | Status: DC | PRN
  Administered 2015-09-26: 20 mg via INTRAVENOUS

## 2015-09-26 MED ORDER — DIPHENHYDRAMINE HCL 50 MG/ML IJ SOLN
INTRAMUSCULAR | Status: DC | PRN
  Administered 2015-09-26: 25 mg via INTRAVENOUS

## 2015-09-26 MED ORDER — ALBUTEROL SULFATE (2.5 MG/3ML) 0.083% IN NEBU: 2.5000 mg | INHALATION_SOLUTION | Freq: Four times a day (QID) | RESPIRATORY_TRACT | Status: DC

## 2015-09-26 SURGICAL SUPPLY — 21 items
BALLN EMERGE MR 2.5X12 (BALLOONS) ×3
BALLN ~~LOC~~ EMERGE MR 2.75X12 (BALLOONS) ×3
BALLOON EMERGE MR 2.5X12 (BALLOONS) ×1 IMPLANT
BALLOON ~~LOC~~ EMERGE MR 2.75X12 (BALLOONS) ×1 IMPLANT
BIOFREEDOM 2.5X14 (Permanent Stent) ×3 IMPLANT
CATH INFINITI 5 FR JL3.5 (CATHETERS) ×3 IMPLANT
CATH INFINITI 5FR ANG PIGTAIL (CATHETERS) ×3 IMPLANT
CATH INFINITI 5FR JL4 (CATHETERS) ×3 IMPLANT
CATH INFINITI JR4 5F (CATHETERS) ×3 IMPLANT
DEVICE RAD COMP TR BAND LRG (VASCULAR PRODUCTS) ×3 IMPLANT
GLIDESHEATH SLEND SS 6F .021 (SHEATH) ×3 IMPLANT
GUIDE CATH RUNWAY 6FR CLS3.5 (CATHETERS) ×3 IMPLANT
KIT ENCORE 26 ADVANTAGE (KITS) ×3 IMPLANT
KIT HEART LEFT (KITS) ×3 IMPLANT
PACK CARDIAC CATHETERIZATION (CUSTOM PROCEDURE TRAY) ×3 IMPLANT
SYR MEDRAD MARK V 150ML (SYRINGE) ×3 IMPLANT
TRANSDUCER W/STOPCOCK (MISCELLANEOUS) ×3 IMPLANT
TUBING CIL FLEX 10 FLL-RA (TUBING) ×3 IMPLANT
WIRE ASAHI PROWATER 180CM (WIRE) ×3 IMPLANT
WIRE HI TORQ VERSACORE-J 145CM (WIRE) ×3 IMPLANT
WIRE SAFE-T 1.5MM-J .035X260CM (WIRE) ×3 IMPLANT

## 2015-09-26 NOTE — Progress Notes (Signed)
Patient Name: Jason SLOVER Date of Encounter: 09/26/2015  Principal Problem:   Acute on chronic combined systolic and diastolic congestive heart failure (HCC) Active Problems:   DM (diabetes mellitus), type 2 with peripheral vascular complications ([place])   Dyslipidemia   Essential hypertension   CAD S/P percutaneous coronary angioplasty   Chronic atrial fibrillation ([place]): CHA2DS2Vasc = 5. On Warfarin. Rate control   Abdominal aortic aneurysm (HCC)   TOBACCO USER   Chest pain   SOB (shortness of breath)   CKD (chronic kidney disease), stage III   Progressive angina (HCC)   Diastolic congestive heart failure (HCC)   Paroxysmal atrial fibrillation [facility])   Primary Cardiologist: New, saw Dr. Verl Blalock in 2013.  Patient Profile: Jason Fisher is a very pleasant 75 year old gentleman with history of known coronary disease status post PCI to the RCA in 2007 & 2009.Small non-STEMI in 2002: Only moderate CAD noted. No culprit lesion found. Cardiac catheterization 2007: PCI 2 to the RCA for inferior MI - vision BMS 2. Last catheterization was August 2009 for abnormal Myoview: He had PCI with 3.0 x 20 mm Promus DES stent for in-stent restenosis in pRCA (3.5 mm). Also with history of HTN, HLD, DM, AAA, Afib (On Eliquis), and CKD stage III. Presented with chest pain on 09/25/15.   SUBJECTIVE: Feels well, able to lay flat this am. No chest pain.   OBJECTIVE Vitals:   09/25/15 1009 09/25/15 1339 09/25/15 2019 09/26/15 0401  BP: (!) 147/70 139/86 (!) 160/71 (!) 156/96  Pulse:  74 71 (!) 57  Resp:  20 20 20   Temp:  98.4 F (36.9 C) 97.6 F (36.4 C) 97.5 F (36.4 C)  TempSrc:  Oral Oral Oral  SpO2:  92% 95% 95%  Weight:    206 lb 6.4 oz (93.6 kg)  Height:        Intake/Output Summary (Last 24 hours) at 09/26/15 [id number] Last data filed at 09/25/15 1900  Gross per 24 hour  Intake              720 ml  Output                4 ml  Net              716 ml   Filed Weights   09/24/15 2017  09/25/15 0309 09/26/15 0401  Weight: 211 lb (95.7 kg) 207 lb 4.8 oz (94 kg) 206 lb 6.4 oz (93.6 kg)    PHYSICAL EXAM General: Well developed, well nourished, male in no acute distress. Head: Normocephalic, atraumatic.  Neck: Supple without bruits, no JVD. Lungs:  Resp regular and unlabored, crackles in right lower lobe.  Heart: RRR, S1, S2, no S3, S4, or murmur; no rub. Abdomen: Soft, non-tender, non-distended, BS + x 4.  Extremities: No clubbing, cyanosis, +1 pretibial edema.  Neuro: Alert and oriented X 3. Moves all extremities spontaneously. Psych: Normal affect.  LABS: CBC: Recent Labs  09/24/15 2038 09/26/15 0227  WBC 6.6 7.2  HGB 13.1 13.2  HCT 41.8 42.1  MCV 92.3 92.5  PLT 148* 143*   INR: Recent Labs  09/25/15 0221  INR [id number]   Basic Metabolic Panel: Recent Labs  09/24/15 2038 09/25/15 0815 09/26/15 0227  NA 140  --  142  K 3.6  --  4.0  CL 108  --  106  CO2 27  --  29  GLUCOSE 181*  --  102*  BUN 22*  --  21*  CREATININE 1.25*  --  1.28*  CALCIUM 9.3  --  9.2  MG  --  2.1 2.2   Cardiac Enzymes: Recent Labs  09/25/15 0221 09/25/15 0815 09/25/15 1246  TROPONINI <0.03 <0.03 <0.03    Recent Labs  09/24/15 2100  TROPIPOC 0.00   BNP:  B Natriuretic Peptide  Date/Time Value Ref Range Status  09/24/2015 08:38 PM 155.5 (H) 0.0 - 100.0 pg/mL Final  Hemoglobin A1C: Recent Labs  09/25/15 0222  HGBA1C 6.7*   Fasting Lipid Panel: Recent Labs  09/25/15 0221  CHOL 137  HDL 47  LDLCALC 77  TRIG 64  CHOLHDL 2.9    Current Facility-Administered Medications:  .  0.9 %  sodium chloride infusion, 250 mL, Intravenous, PRN, Ivor Costa, MD .  0.9 %  sodium chloride infusion, 250 mL, Intravenous, PRN, Leonie Man, MD .  0.9 %  sodium chloride infusion, , Intravenous, Continuous, Leonie Man, MD .  acetaminophen (TYLENOL) tablet 650 mg, 650 mg, Oral, Q4H PRN, Ivor Costa, MD .  atorvastatin (LIPITOR) tablet 20 mg, 20 mg, Oral, q1800, Ivor Costa, MD, 20 mg at 09/25/15 1757 .  clotrimazole (LOTRIMIN) 1 % cream 1 application, 1 application, Topical, BID, Ivor Costa, MD, 1 application at [id number] .  diltiazem (CARDIZEM CD) 24 hr capsule 120 mg, 120 mg, Oral, Daily, Ivor Costa, MD, 120 mg at 09/25/15 1009 .  furosemide (LASIX) injection 40 mg, 40 mg, Intravenous, Q12H, Ivor Costa, MD, Stopped at 09/26/15 0600 .  HYDROcodone-acetaminophen (NORCO/VICODIN) 5-325 MG per tablet 1 tablet, 1 tablet, Oral, Q6H PRN, Ivor Costa, MD .  insulin aspart (novoLOG) injection 0-5 Units, 0-5 Units, Subcutaneous, QHS, Ivor Costa, MD .  insulin aspart (novoLOG) injection 0-9 Units, 0-9 Units, Subcutaneous, TID WC, Ivor Costa, MD, 3 Units at 09/25/15 1757 .  metoprolol tartrate (LOPRESSOR) tablet 100 mg, 100 mg, Oral, BID, Ivor Costa, MD, 100 mg at 09/25/15 2125 .  nicotine (NICODERM CQ - dosed in mg/24 hours) patch 21 mg, 21 mg, Transdermal, Daily, Ivor Costa, MD, 21 mg at 09/25/15 1009 .  nitroGLYCERIN (NITROSTAT) SL tablet 0.4 mg, 0.4 mg, Sublingual, Q5 min PRN, Ivor Costa, MD .  ondansetron [facility]) injection 4 mg, 4 mg, Intravenous, Q6H PRN, Ivor Costa, MD .  polyethylene glycol (MIRALAX / GLYCOLAX) packet 17 g, 17 g, Oral, BID PRN, Ivor Costa, MD .  ramipril (ALTACE) capsule 10 mg, 10 mg, Oral, Daily, Ivor Costa, MD, 10 mg at 09/25/15 1009 .  sodium chloride flush (NS) 0.9 % injection 3 mL, 3 mL, Intravenous, Q12H, Ivor Costa, MD, 3 mL at 09/25/15 2126 .  sodium chloride flush (NS) 0.9 % injection 3 mL, 3 mL, Intravenous, PRN, Ivor Costa, MD .  sodium chloride flush (NS) 0.9 % injection 3 mL, 3 mL, Intravenous, Q12H, Leonie Man, MD, 3 mL at 09/25/15 2332 .  sodium chloride flush (NS) 0.9 % injection 3 mL, 3 mL, Intravenous, PRN, Leonie Man, MD . sodium chloride     TELE:   Atrial fibrillation rate controlled.    Radiology/Studies: Dg Chest 2 View  Result Date: 09/24/2015 CLINICAL DATA:  Shortness of breath. Chest pressure. Bilateral leg swelling  for 1 week. Patient reports chronic cough. EXAM: CHEST  2 VIEW COMPARISON:  Chest radiograph 04/09/2005 FINDINGS: Heart at the upper limits normal in size. There is tortuosity and atherosclerosis of the thoracic aorta. The lungs are hyperinflated with small bilateral pleural effusions, left greater than right. Associated bibasilar opacities are  likely atelectasis. Minimal vascular congestion without alveolar edema. No focal consolidation. No pneumothorax. No acute osseous abnormalities are seen. There is degenerative change in the spine. IMPRESSION: 1. Borderline cardiomegaly with small pleural effusions and mild vascular congestion, suggesting mild fluid overload/CHF. 2. Hyperinflation suggests emphysema. Electronically Signed   By: Jeb Levering M.D.   On: 09/24/2015 21:24     Current Medications:  . atorvastatin  20 mg Oral q1800  . clotrimazole  1 application Topical BID  . diltiazem  120 mg Oral Daily  . furosemide  40 mg Intravenous Q12H  . insulin aspart  0-5 Units Subcutaneous QHS  . insulin aspart  0-9 Units Subcutaneous TID WC  . metoprolol  100 mg Oral BID  . nicotine  21 mg Transdermal Daily  . ramipril  10 mg Oral Daily  . sodium chloride flush  3 mL Intravenous Q12H  . sodium chloride flush  3 mL Intravenous Q12H   . sodium chloride      ASSESSMENT AND PLAN: Principal Problem:   Acute on chronic combined systolic and diastolic congestive heart failure (HCC) Active Problems:   DM (diabetes mellitus), type 2 with peripheral vascular complications (HCC)   Dyslipidemia   Essential hypertension   CAD S/P percutaneous coronary angioplasty   Chronic atrial fibrillation ([place]): CHA2DS2Vasc = 5. On Warfarin. Rate control   Abdominal aortic aneurysm (HCC)   TOBACCO USER   Chest pain   SOB (shortness of breath)   CKD (chronic kidney disease), stage III   Progressive angina (HCC)   Diastolic congestive heart failure (HCC)   Paroxysmal atrial fibrillation ([place])   1. Chest  pain with history of CAD: Has history of CAD, most recently had left heart cath in 2009 after abnormal Myoview, had DES placed to [facility] for in-stent restenosis. Presented with chest pain and exertional dyspnea for 2 months. Plan is for left heart cath today.   2. Acute on chronic combined systolic and diastolic CHF: Echo pending for this admission, last was in 2011, had normal EF at that time. Volume overloaded on exam, endorsed orthopnea and PND. Chest x ray consistent with CHF.   No urine output charted overnight. Lasix held pre cath, will follow LVEDP.   3. HTN: hypertensive, on beta blocker and ACE-I. Creatinine is elevated at 1.28. Would favor increasing beta blocker over increasing ACE-I.   4. Permanent atrial fibrillation: Rate controlled on diltiazem and beta blocker.   This patients CHA2DS2-VASc Score and unadjusted Ischemic Stroke Rate (% per year) is equal to 7.2 % stroke rate/year from a score of 5 Above score calculated as 1 point each if present [CHF, HTN, DM, Vascular=MI/PAD/Aortic Plaque, Age if 65-74, or Male], 2 points each if present [Age > 75, or Stroke/TIA/TE]  On Eliquis for anticoagulation.   5. CKD stage III: baseline creatinine is 1.2-1.3. Stable.   6. Tobacco abuse: Encouraged cessation.     Signed, Arbutus Leas , NP 7:42 AM 09/26/2015 Pager [phone]    I have seen, examined and evaluated the patient this aM along with Jettie Booze, NP-c.  After reviewing all the available data and chart, we discussed the patients laboratory, study & physical findings as well as symptoms in detail. I agree with her findings, examination as well as impression recommendations as per our discussion.    Feels better after initial therapy including diuresis.  No further chest pain, but this was mostly exertional class III type angina. Plan left heart catheterization with angiography plus minus PCI today  in order to determine if this is occlusive CAD related angina versus CHF  related discomfort.  Further treatment recommendations based on the results of cardiac catheterization with LVEDP  On stable beta blocker, ACE inhibitor for CHF/hypertension. Also on diltiazem for additional rate control for chronic A. Fib. On statin.     Glenetta Hew, M.D., M.S. Interventional Cardiologist   Pager # [phone] Phone # [phone] [address] [place],  [address]

## 2015-09-26 NOTE — Progress Notes (Signed)
PROGRESS NOTE    Jason Fisher  [id number] DOB: 1940-09-13 DOA: 09/25/2015 PCP: Nyoka Cowden, MD    Brief Narrative:   Jason Fisher is a 75 y.o. male with medical history significant of hypertension, hyperlipidemia, diabetes mellitus, CAD, MI, s/p of stent, Afib on Eliquis, AAA, CKD-III, who presents with a shortness of breath, chest pain and leg edema.  Patient reports that he has been having intermittent chest pain for about 2 month. The chest pain is located in the front chest, pressure-like, nonradiating, 5 out of 10 in severity. No tenderness over calf areas. Patient has cough with clear mucus production. In the past 2 days, he noted bilateral leg edema which is new for patient. No fever or chills. Patient denies nausea, vomiting, abdominal pain, symptoms of UTI, diarrhea, unilateral weakness.  ED Course: pt was found to have BNP 155.5, negative troponin, WBC 6.6, temperature normal, stable renal function. Chest x-ray showed vascular congestion consistent with CHF. Patient is placed on telemetry bed for observation.    Assessment & Plan:   Principal Problem:   Acute on chronic combined systolic and diastolic congestive heart failure (HCC) Active Problems:   Chest pain   DM (diabetes mellitus), type 2 with peripheral vascular complications (HCC)   Dyslipidemia   Essential hypertension   CAD S/P percutaneous coronary angioplasty   Chronic atrial fibrillation ([place]): CHA2DS2Vasc = 5. On Warfarin. Rate control   Abdominal aortic aneurysm (HCC)   TOBACCO USER   SOB (shortness of breath)   CKD (chronic kidney disease), stage III   Progressive angina (HCC)   Diastolic congestive heart failure (HCC)   Paroxysmal atrial fibrillation (HCC)  #1 acute on chronic combined systolic and diastolic heart failure Questionable etiology. Could be secondary to dietary indiscretion versus secondary to acute coronary syndrome as patient with complaints of chest pain on exertion  and occasionally at rest. Patient also noted on admission to have some orthopnea with lower extremity edema and shortness of breath. Patient with clinical improvement. Strict I's and O have not been recorded accurately. Patient's weight today is 93.62 kg from 94.031 kg yesterday 09/25/2015 from 95.709 kg a 22 2017. On 09/06/2015 patient's weight was 92.26 kg during the office visit. Patient with clinical improvement. Continue Lasix 40 mg IV every 12 hours, Lopressor, Lipitor, Cardizem, ramipril. Patient for cardiac catheterization today. Cardiology following and appreciate input and recommendations.  #2 chest pain/history of coronary artery disease Patient presented with chest pain ongoing for the past 2 months worse on exertion with associated shortness of breath and concern for angina. Patient with history of coronary artery disease with cath in 2009 with DES placed to RCA for in-stent restenosis. Patient with clinical improvement after diureses. Patient has been seen in consultation by cardiology and patient for cardiac catheterization today. 2-D echo pending. Continue heparin, Lopressor, Lipitor, IV Lasix, Cardizem, ramipril. Cardiology following and appreciate input and recommendations.  #3 hypertension Blood pressure this morning 156/96 have the patient has not received any of his medications at this time. Continue Cardizem, Lopressor, ramipril.  #4 atrial fibrillation  CHA2DS2VASC 5. Patient currently rate controlled on Lopressor and Cardizem. Patient was on ELIQUIS for anticoagulation currently on IV heparin, for cardiac catheterization. Follow.  #5 chronic kidney disease stage III Baseline creatinine 1.2-1.3. Currently stable. Follow with diuresis.  #6 hyperlipidemia LDL was 91 08/30/2015 Continue Lipitor.  #7 diabetes mellitus type 2 Hemoglobin A1c 6.7 on 08/24/2015. CBGs 111-122. Hold oral hypoglycemic agents. Sliding scale insulin.  #8 tobacco abuse Tobacco  cessation. Nicotine  patch.     DVT prophylaxis: Heparin Code Status: Full Family Communication: Updated patient. No family at bedside. Disposition Plan: Home when medically stable. Per cardiology.   Consultants:   Cardiology: Dr. Ellyn Hack 09/25/2015  Procedures:   Chest x-ray 09/24/2015    Antimicrobials:  None   Subjective: Patient sitting up in chair. Patient states shortness of breath improving. Patient denies any chest pain.  Objective: Vitals:   09/25/15 1339 09/25/15 2019 09/26/15 0401 09/26/15 0955  BP: 139/86 (!) 160/71 (!) 156/96   Pulse: 74 71 (!) 57   Resp: 20 20 20    Temp: 98.4 F (36.9 C) 97.6 F (36.4 C) 97.5 F (36.4 C)   TempSrc: Oral Oral Oral   SpO2: 92% 95% 95% 96%  Weight:   93.6 kg (206 lb 6.4 oz)   Height:        Intake/Output Summary (Last 24 hours) at 09/26/15 1009 Last data filed at 09/25/15 1900  Gross per 24 hour  Intake              480 ml  Output                3 ml  Net              477 ml   Filed Weights   09/24/15 2017 09/25/15 0309 09/26/15 0401  Weight: 95.7 kg (211 lb) 94 kg (207 lb 4.8 oz) 93.6 kg (206 lb 6.4 oz)    Examination:  General exam: Appears calm and comfortable  Respiratory system: bibasilar crackles. No wheezing. Respiratory effort normal. Cardiovascular system: S1 & S2 heard, RRR. No JVD, murmurs, rubs, gallops or clicks. 1-2+ bilateral lower extremity edema Gastrointestinal system: Abdomen is nondistended, soft and nontender. No organomegaly or masses felt. Normal bowel sounds heard. Central nervous system: Alert and oriented. No focal neurological deficits. Extremities: Symmetric 5 x 5 power. Skin: No rashes, lesions or ulcers Psychiatry: Judgement and insight appear normal. Mood & affect appropriate.     Data Reviewed: I have personally reviewed following labs and imaging studies  CBC:  Recent Labs Lab 09/24/15 2038 09/26/15 0227  WBC 6.6 7.2  HGB 13.1 13.2  HCT 41.8 42.1  MCV 92.3 92.5  PLT 148* 143*    Basic Metabolic Panel:  Recent Labs Lab 09/24/15 2038 09/25/15 0815 09/26/15 0227  NA 140  --  142  K 3.6  --  4.0  CL 108  --  106  CO2 27  --  29  GLUCOSE 181*  --  102*  BUN 22*  --  21*  CREATININE 1.25*  --  1.28*  CALCIUM 9.3  --  9.2  MG  --  2.1 2.2   GFR: Estimated Creatinine Clearance: 56.2 mL/min (by C-G formula based on SCr of 1.28 mg/dL). Liver Function Tests: No results for input(s): AST, ALT, ALKPHOS, BILITOT, PROT, ALBUMIN in the last 168 hours. No results for input(s): LIPASE, AMYLASE in the last 168 hours. No results for input(s): AMMONIA in the last 168 hours. Coagulation Profile:  Recent Labs Lab 09/25/15 0221  INR 1.22   Cardiac Enzymes:  Recent Labs Lab 09/25/15 0221 09/25/15 0815 09/25/15 1246  TROPONINI <0.03 <0.03 <0.03   BNP (last 3 results) No results for input(s): PROBNP in the last 8760 hours. HbA1C:  Recent Labs  09/25/15 0222  HGBA1C 6.7*   CBG:  Recent Labs Lab 09/25/15 0607 09/25/15 1101 09/25/15 1623 09/25/15 2100 09/26/15 0622  GLUCAP 157* 140*  216* 111* 122*   Lipid Profile:  Recent Labs  09/25/15 0221  CHOL 137  HDL 47  LDLCALC 77  TRIG 64  CHOLHDL 2.9   Thyroid Function Tests: No results for input(s): TSH, T4TOTAL, FREET4, T3FREE, THYROIDAB in the last 72 hours. Anemia Panel: No results for input(s): VITAMINB12, FOLATE, FERRITIN, TIBC, IRON, RETICCTPCT in the last 72 hours. Sepsis Labs: No results for input(s): PROCALCITON, LATICACIDVEN in the last 168 hours.  No results found for this or any previous visit (from the past 240 hour(s)).       Radiology Studies: Dg Chest 2 View  Result Date: 09/24/2015 CLINICAL DATA:  Shortness of breath. Chest pressure. Bilateral leg swelling for 1 week. Patient reports chronic cough. EXAM: CHEST  2 VIEW COMPARISON:  Chest radiograph 04/09/2005 FINDINGS: Heart at the upper limits normal in size. There is tortuosity and atherosclerosis of the thoracic aorta.  The lungs are hyperinflated with small bilateral pleural effusions, left greater than right. Associated bibasilar opacities are likely atelectasis. Minimal vascular congestion without alveolar edema. No focal consolidation. No pneumothorax. No acute osseous abnormalities are seen. There is degenerative change in the spine. IMPRESSION: 1. Borderline cardiomegaly with small pleural effusions and mild vascular congestion, suggesting mild fluid overload/CHF. 2. Hyperinflation suggests emphysema. Electronically Signed   By: Jeb Levering M.D.   On: 09/24/2015 21:24        Scheduled Meds: . [MAR Hold] atorvastatin  20 mg Oral q1800  . [MAR Hold] clotrimazole  1 application Topical BID  . [MAR Hold] diltiazem  120 mg Oral Daily  . [MAR Hold] furosemide  40 mg Intravenous Q12H  . [MAR Hold] insulin aspart  0-5 Units Subcutaneous QHS  . [MAR Hold] insulin aspart  0-9 Units Subcutaneous TID WC  . [MAR Hold] metoprolol  100 mg Oral BID  . [MAR Hold] nicotine  21 mg Transdermal Daily  . [MAR Hold] ramipril  10 mg Oral Daily  . [MAR Hold] sodium chloride flush  3 mL Intravenous Q12H  . sodium chloride flush  3 mL Intravenous Q12H   Continuous Infusions: . sodium chloride 75 mL/hr at 09/26/15 0834  . heparin       LOS: 0 days    Time spent: 57 mins    Jason Aveni, MD [facility] Pager [phone]  If 7PM-7AM, please contact night-coverage [email or web address] Password [email or web address] 09/26/2015, 10:09 AM [id number]

## 2015-09-26 NOTE — Interval H&P Note (Signed)
History and Physical Interval Note:  09/26/2015 9:49 AM  Jason Fisher  has presented today for surgery, with the diagnosis of angina  The various methods of treatment have been discussed with the patient and family. After consideration of risks, benefits and other options for treatment, the patient has consented to  Procedure(s): Left Heart Cath and Coronary Angiography (N/A) as a surgical intervention .  The patient's history has been reviewed, patient examined, no change in status, stable for surgery.  I have reviewed the patient's chart and labs.  Questions were answered to the patient's satisfaction.    Cath Lab Visit (complete for each Cath Lab visit)  Clinical Evaluation Leading to the Procedure:   ACS: Yes.    Non-ACS:    Anginal Classification: CCS III  Anti-ischemic medical therapy: Maximal Therapy (2 or more classes of medications)  Non-Invasive Test Results: No non-invasive testing performed  Prior CABG: No previous CABG       [name] 09/26/2015 9:50 AM

## 2015-09-26 NOTE — Progress Notes (Signed)
Called to bedside for stat HHN albuterol.  Verified order, per MD give albuterol HHN.  Pt appeared SOB, on NRB when I entered room.  Pt on side of bed trying to urinate.  Dr [place] wants HHN x 1 now- albuterol, however simultaneously attending MD canceled albuterol and ordered xopenex.  Albuterol order re-entered x 1 now for pt care needs.  Verified allergies prior to administration.  RN at bedside and aware.  Pt appeared to tolerate treatment well, cath lab preparing to transfer pt to SDU.

## 2015-09-26 NOTE — Progress Notes (Signed)
Patient Tx to Cath lab. VSS. No CP. No SOB. MD at bedside. MD ordered to turn fluids off when Tx to Cath lab due to fluid overload. Tele monitor removed. CCMD notified.   Domingo Dimes RN

## 2015-09-26 NOTE — H&P (View-Only) (Signed)
Patient Name: RATANA FLOOD Date of Encounter: 09/26/2015  Principal Problem:   Acute on chronic combined systolic and diastolic congestive heart failure (HCC) Active Problems:   DM (diabetes mellitus), type 2 with peripheral vascular complications ([place])   Dyslipidemia   Essential hypertension   CAD S/P percutaneous coronary angioplasty   Chronic atrial fibrillation ([place]): CHA2DS2Vasc = 5. On Warfarin. Rate control   Abdominal aortic aneurysm (HCC)   TOBACCO USER   Chest pain   SOB (shortness of breath)   CKD (chronic kidney disease), stage III   Progressive angina (HCC)   Diastolic congestive heart failure (HCC)   Paroxysmal atrial fibrillation [facility])   Primary Cardiologist: New, saw Dr. Verl Blalock in 2013.  Patient Profile: Mr. Wicht is a very pleasant 75 year old gentleman with history of known coronary disease status post PCI to the RCA in 2007 & 2009.Small non-STEMI in 2002: Only moderate CAD noted. No culprit lesion found. Cardiac catheterization 2007: PCI 2 to the RCA for inferior MI - vision BMS 2. Last catheterization was August 2009 for abnormal Myoview: He had PCI with 3.0 x 20 mm Promus DES stent for in-stent restenosis in pRCA (3.5 mm). Also with history of HTN, HLD, DM, AAA, Afib (On Eliquis), and CKD stage III. Presented with chest pain on 09/25/15.   SUBJECTIVE: Feels well, able to lay flat this am. No chest pain.   OBJECTIVE Vitals:   09/25/15 1009 09/25/15 1339 09/25/15 2019 09/26/15 0401  BP: (!) 147/70 139/86 (!) 160/71 (!) 156/96  Pulse:  74 71 (!) 57  Resp:  20 20 20   Temp:  98.4 F (36.9 C) 97.6 F (36.4 C) 97.5 F (36.4 C)  TempSrc:  Oral Oral Oral  SpO2:  92% 95% 95%  Weight:    206 lb 6.4 oz (93.6 kg)  Height:        Intake/Output Summary (Last 24 hours) at 09/26/15 [id number] Last data filed at 09/25/15 1900  Gross per 24 hour  Intake              720 ml  Output                4 ml  Net              716 ml   Filed Weights   09/24/15 2017  09/25/15 0309 09/26/15 0401  Weight: 211 lb (95.7 kg) 207 lb 4.8 oz (94 kg) 206 lb 6.4 oz (93.6 kg)    PHYSICAL EXAM General: Well developed, well nourished, male in no acute distress. Head: Normocephalic, atraumatic.  Neck: Supple without bruits, no JVD. Lungs:  Resp regular and unlabored, crackles in right lower lobe.  Heart: RRR, S1, S2, no S3, S4, or murmur; no rub. Abdomen: Soft, non-tender, non-distended, BS + x 4.  Extremities: No clubbing, cyanosis, +1 pretibial edema.  Neuro: Alert and oriented X 3. Moves all extremities spontaneously. Psych: Normal affect.  LABS: CBC: Recent Labs  09/24/15 2038 09/26/15 0227  WBC 6.6 7.2  HGB 13.1 13.2  HCT 41.8 42.1  MCV 92.3 92.5  PLT 148* 143*   INR: Recent Labs  09/25/15 0221  INR [id number]   Basic Metabolic Panel: Recent Labs  09/24/15 2038 09/25/15 0815 09/26/15 0227  NA 140  --  142  K 3.6  --  4.0  CL 108  --  106  CO2 27  --  29  GLUCOSE 181*  --  102*  BUN 22*  --  21*  CREATININE 1.25*  --  1.28*  CALCIUM 9.3  --  9.2  MG  --  2.1 2.2   Cardiac Enzymes: Recent Labs  09/25/15 0221 09/25/15 0815 09/25/15 1246  TROPONINI <0.03 <0.03 <0.03    Recent Labs  09/24/15 2100  TROPIPOC 0.00   BNP:  B Natriuretic Peptide  Date/Time Value Ref Range Status  09/24/2015 08:38 PM 155.5 (H) 0.0 - 100.0 pg/mL Final  Hemoglobin A1C: Recent Labs  09/25/15 0222  HGBA1C 6.7*   Fasting Lipid Panel: Recent Labs  09/25/15 0221  CHOL 137  HDL 47  LDLCALC 77  TRIG 64  CHOLHDL 2.9    Current Facility-Administered Medications:  .  0.9 %  sodium chloride infusion, 250 mL, Intravenous, PRN, Ivor Costa, MD .  0.9 %  sodium chloride infusion, 250 mL, Intravenous, PRN, Leonie Man, MD .  0.9 %  sodium chloride infusion, , Intravenous, Continuous, Leonie Man, MD .  acetaminophen (TYLENOL) tablet 650 mg, 650 mg, Oral, Q4H PRN, Ivor Costa, MD .  atorvastatin (LIPITOR) tablet 20 mg, 20 mg, Oral, q1800, Ivor Costa, MD, 20 mg at 09/25/15 1757 .  clotrimazole (LOTRIMIN) 1 % cream 1 application, 1 application, Topical, BID, Ivor Costa, MD, 1 application at [phone] .  diltiazem (CARDIZEM CD) 24 hr capsule 120 mg, 120 mg, Oral, Daily, Ivor Costa, MD, 120 mg at 09/25/15 1009 .  furosemide (LASIX) injection 40 mg, 40 mg, Intravenous, Q12H, Ivor Costa, MD, Stopped at 09/26/15 0600 .  HYDROcodone-acetaminophen (NORCO/VICODIN) 5-325 MG per tablet 1 tablet, 1 tablet, Oral, Q6H PRN, Ivor Costa, MD .  insulin aspart (novoLOG) injection 0-5 Units, 0-5 Units, Subcutaneous, QHS, Ivor Costa, MD .  insulin aspart (novoLOG) injection 0-9 Units, 0-9 Units, Subcutaneous, TID WC, Ivor Costa, MD, 3 Units at 09/25/15 1757 .  metoprolol tartrate (LOPRESSOR) tablet 100 mg, 100 mg, Oral, BID, Ivor Costa, MD, 100 mg at 09/25/15 2125 .  nicotine (NICODERM CQ - dosed in mg/24 hours) patch 21 mg, 21 mg, Transdermal, Daily, Ivor Costa, MD, 21 mg at 09/25/15 1009 .  nitroGLYCERIN (NITROSTAT) SL tablet 0.4 mg, 0.4 mg, Sublingual, Q5 min PRN, Ivor Costa, MD .  ondansetron [facility]) injection 4 mg, 4 mg, Intravenous, Q6H PRN, Ivor Costa, MD .  polyethylene glycol (MIRALAX / GLYCOLAX) packet 17 g, 17 g, Oral, BID PRN, Ivor Costa, MD .  ramipril (ALTACE) capsule 10 mg, 10 mg, Oral, Daily, Ivor Costa, MD, 10 mg at 09/25/15 1009 .  sodium chloride flush (NS) 0.9 % injection 3 mL, 3 mL, Intravenous, Q12H, Ivor Costa, MD, 3 mL at 09/25/15 2126 .  sodium chloride flush (NS) 0.9 % injection 3 mL, 3 mL, Intravenous, PRN, Ivor Costa, MD .  sodium chloride flush (NS) 0.9 % injection 3 mL, 3 mL, Intravenous, Q12H, Leonie Man, MD, 3 mL at 09/25/15 2332 .  sodium chloride flush (NS) 0.9 % injection 3 mL, 3 mL, Intravenous, PRN, Leonie Man, MD . sodium chloride     TELE:   Atrial fibrillation rate controlled.    Radiology/Studies: Dg Chest 2 View  Result Date: 09/24/2015 CLINICAL DATA:  Shortness of breath. Chest pressure. Bilateral leg swelling  for 1 week. Patient reports chronic cough. EXAM: CHEST  2 VIEW COMPARISON:  Chest radiograph 04/09/2005 FINDINGS: Heart at the upper limits normal in size. There is tortuosity and atherosclerosis of the thoracic aorta. The lungs are hyperinflated with small bilateral pleural effusions, left greater than right. Associated bibasilar opacities are  likely atelectasis. Minimal vascular congestion without alveolar edema. No focal consolidation. No pneumothorax. No acute osseous abnormalities are seen. There is degenerative change in the spine. IMPRESSION: 1. Borderline cardiomegaly with small pleural effusions and mild vascular congestion, suggesting mild fluid overload/CHF. 2. Hyperinflation suggests emphysema. Electronically Signed   By: Jeb Levering M.D.   On: 09/24/2015 21:24     Current Medications:  . atorvastatin  20 mg Oral q1800  . clotrimazole  1 application Topical BID  . diltiazem  120 mg Oral Daily  . furosemide  40 mg Intravenous Q12H  . insulin aspart  0-5 Units Subcutaneous QHS  . insulin aspart  0-9 Units Subcutaneous TID WC  . metoprolol  100 mg Oral BID  . nicotine  21 mg Transdermal Daily  . ramipril  10 mg Oral Daily  . sodium chloride flush  3 mL Intravenous Q12H  . sodium chloride flush  3 mL Intravenous Q12H   . sodium chloride      ASSESSMENT AND PLAN: Principal Problem:   Acute on chronic combined systolic and diastolic congestive heart failure (HCC) Active Problems:   DM (diabetes mellitus), type 2 with peripheral vascular complications (HCC)   Dyslipidemia   Essential hypertension   CAD S/P percutaneous coronary angioplasty   Chronic atrial fibrillation ([place]): CHA2DS2Vasc = 5. On Warfarin. Rate control   Abdominal aortic aneurysm (HCC)   TOBACCO USER   Chest pain   SOB (shortness of breath)   CKD (chronic kidney disease), stage III   Progressive angina (HCC)   Diastolic congestive heart failure (HCC)   Paroxysmal atrial fibrillation ([place])   1. Chest  pain with history of CAD: Has history of CAD, most recently had left heart cath in 2009 after abnormal Myoview, had DES placed to [facility] for in-stent restenosis. Presented with chest pain and exertional dyspnea for 2 months. Plan is for left heart cath today.   2. Acute on chronic combined systolic and diastolic CHF: Echo pending for this admission, last was in 2011, had normal EF at that time. Volume overloaded on exam, endorsed orthopnea and PND. Chest x ray consistent with CHF.   No urine output charted overnight. Lasix held pre cath, will follow LVEDP.   3. HTN: hypertensive, on beta blocker and ACE-I. Creatinine is elevated at 1.28. Would favor increasing beta blocker over increasing ACE-I.   4. Permanent atrial fibrillation: Rate controlled on diltiazem and beta blocker.   This patients CHA2DS2-VASc Score and unadjusted Ischemic Stroke Rate (% per year) is equal to 7.2 % stroke rate/year from a score of 5 Above score calculated as 1 point each if present [CHF, HTN, DM, Vascular=MI/PAD/Aortic Plaque, Age if 65-74, or Male], 2 points each if present [Age > 75, or Stroke/TIA/TE]  On Eliquis for anticoagulation.   5. CKD stage III: baseline creatinine is 1.2-1.3. Stable.   6. Tobacco abuse: Encouraged cessation.     Signed, Arbutus Leas , NP 7:42 AM 09/26/2015 Pager [phone]    I have seen, examined and evaluated the patient this aM along with Jettie Booze, NP-c.  After reviewing all the available data and chart, we discussed the patients laboratory, study & physical findings as well as symptoms in detail. I agree with her findings, examination as well as impression recommendations as per our discussion.    Feels better after initial therapy including diuresis.  No further chest pain, but this was mostly exertional class III type angina. Plan left heart catheterization with angiography plus minus PCI today  in order to determine if this is occlusive CAD related angina versus CHF  related discomfort.  Further treatment recommendations based on the results of cardiac catheterization with LVEDP  On stable beta blocker, ACE inhibitor for CHF/hypertension. Also on diltiazem for additional rate control for chronic A. Fib. On statin.     Glenetta Hew, M.D., M.S. Interventional Cardiologist   Pager # [phone] Phone # [phone] [address] [place], [place] [address]

## 2015-09-27 ENCOUNTER — Inpatient Hospital Stay (HOSPITAL_COMMUNITY): Payer: Medicare Other

## 2015-09-27 DIAGNOSIS — J9601 Acute respiratory failure with hypoxia: Secondary | ICD-10-CM

## 2015-09-27 DIAGNOSIS — I5030 Unspecified diastolic (congestive) heart failure: Secondary | ICD-10-CM

## 2015-09-27 DIAGNOSIS — T7849XA Other allergy, initial encounter: Secondary | ICD-10-CM

## 2015-09-27 DIAGNOSIS — T7849XD Other allergy, subsequent encounter: Secondary | ICD-10-CM

## 2015-09-27 DIAGNOSIS — T508X5A Adverse effect of diagnostic agents, initial encounter: Secondary | ICD-10-CM | POA: Diagnosis not present

## 2015-09-27 DIAGNOSIS — I5033 Acute on chronic diastolic (congestive) heart failure: Secondary | ICD-10-CM

## 2015-09-27 DIAGNOSIS — I110 Hypertensive heart disease with heart failure: Secondary | ICD-10-CM | POA: Diagnosis not present

## 2015-09-27 DIAGNOSIS — I509 Heart failure, unspecified: Secondary | ICD-10-CM

## 2015-09-27 LAB — BASIC METABOLIC PANEL
Anion gap: 12 (ref 5–15)
BUN: 20 mg/dL (ref 6–20)
CALCIUM: 9.2 mg/dL (ref 8.9–10.3)
CHLORIDE: 99 mmol/L — AB (ref 101–111)
CO2: 29 mmol/L (ref 22–32)
CREATININE: 1.4 mg/dL — AB (ref 0.61–1.24)
GFR calc Af Amer: 56 mL/min — ABNORMAL LOW (ref >60)
GFR calc non Af Amer: 48 mL/min — ABNORMAL LOW (ref >60)
Glucose, Bld: 171 mg/dL — ABNORMAL HIGH (ref 65–99)
Potassium: 3 mmol/L — ABNORMAL LOW (ref 3.5–5.1)
SODIUM: 140 mmol/L (ref 135–145)

## 2015-09-27 LAB — GLUCOSE, CAPILLARY
GLUCOSE-CAPILLARY: 199 mg/dL — AB (ref 65–99)
GLUCOSE-CAPILLARY: 334 mg/dL — AB (ref 65–99)
GLUCOSE-CAPILLARY: 324 mg/dL — AB (ref 65–99)
Glucose-Capillary: 158 mg/dL — ABNORMAL HIGH (ref 65–99)

## 2015-09-27 LAB — CBC
HCT: 42.9 % (ref 39.0–52.0)
Hemoglobin: 13.8 g/dL (ref 13.0–17.0)
MCH: 28.8 pg (ref 26.0–34.0)
MCHC: 32.2 g/dL (ref 30.0–36.0)
MCV: 89.6 fL (ref 78.0–100.0)
PLATELETS: 171 10*3/uL (ref 150–400)
RBC: 4.79 MIL/uL (ref 4.22–5.81)
RDW: 14.1 % (ref 11.5–15.5)
WBC: 9.4 10*3/uL (ref 4.0–10.5)

## 2015-09-27 LAB — ECHOCARDIOGRAM COMPLETE
Height: 68 in
Weight: 3174.62 oz

## 2015-09-27 LAB — MAGNESIUM: MAGNESIUM: 2.1 mg/dL (ref 1.7–2.4)

## 2015-09-27 LAB — TROPONIN I: Troponin I: 0.06 ng/mL (ref <0.03)

## 2015-09-27 MED ORDER — DILTIAZEM HCL ER COATED BEADS 120 MG PO CP24
120.0000 mg | ORAL_CAPSULE | Freq: Once | ORAL | Status: DC
  Filled 2015-09-27: qty 1

## 2015-09-27 MED ORDER — METHYLPREDNISOLONE SODIUM SUCC 125 MG IJ SOLR
80.0000 mg | Freq: Three times a day (TID) | INTRAMUSCULAR | Status: DC
  Administered 2015-09-27 – 2015-09-28 (×3): 80 mg via INTRAVENOUS
  Filled 2015-09-27 (×3): qty 2

## 2015-09-27 MED ORDER — FUROSEMIDE 40 MG PO TABS
40.0000 mg | ORAL_TABLET | Freq: Every day | ORAL | Status: DC
  Administered 2015-09-28 – 2015-10-03 (×6): 40 mg via ORAL
  Filled 2015-09-27 (×6): qty 1

## 2015-09-27 MED ORDER — INSULIN GLARGINE 100 UNIT/ML ~~LOC~~ SOLN
10.0000 [IU] | SUBCUTANEOUS | Status: DC
  Administered 2015-09-27 – 2015-09-30 (×4): 10 [IU] via SUBCUTANEOUS
  Filled 2015-09-27 (×6): qty 0.1

## 2015-09-27 MED ORDER — DIPHENHYDRAMINE HCL 25 MG PO CAPS
25.0000 mg | ORAL_CAPSULE | Freq: Two times a day (BID) | ORAL | Status: DC
  Administered 2015-09-27 – 2015-09-30 (×6): 25 mg via ORAL
  Filled 2015-09-27 (×6): qty 1

## 2015-09-27 MED ORDER — FUROSEMIDE 10 MG/ML IJ SOLN
40.0000 mg | Freq: Two times a day (BID) | INTRAMUSCULAR | Status: AC
  Administered 2015-09-27 (×2): 40 mg via INTRAVENOUS
  Filled 2015-09-27 (×2): qty 4

## 2015-09-27 MED ORDER — FUROSEMIDE 10 MG/ML IJ SOLN: 40.0000 mg | Freq: Two times a day (BID) | INTRAMUSCULAR | Status: DC

## 2015-09-27 MED ORDER — DILTIAZEM HCL ER COATED BEADS 120 MG PO CP24: 120.0000 mg | ORAL_CAPSULE | Freq: Every day | ORAL | Status: DC

## 2015-09-27 MED ORDER — IPRATROPIUM BROMIDE 0.02 % IN SOLN
0.5000 mg | Freq: Two times a day (BID) | RESPIRATORY_TRACT | Status: DC
  Administered 2015-09-28 (×2): 0.5 mg via RESPIRATORY_TRACT
  Filled 2015-09-27 (×2): qty 2.5

## 2015-09-27 MED ORDER — POTASSIUM CHLORIDE CRYS ER 20 MEQ PO TBCR
40.0000 meq | EXTENDED_RELEASE_TABLET | ORAL | Status: AC
  Administered 2015-09-27 (×2): 40 meq via ORAL
  Filled 2015-09-27 (×2): qty 2

## 2015-09-27 MED ORDER — LEVALBUTEROL HCL 0.63 MG/3ML IN NEBU
0.6300 mg | INHALATION_SOLUTION | Freq: Two times a day (BID) | RESPIRATORY_TRACT | Status: DC
  Administered 2015-09-28 (×2): 0.63 mg via RESPIRATORY_TRACT
  Filled 2015-09-27 (×2): qty 3

## 2015-09-27 MED ORDER — DILTIAZEM HCL 25 MG/5ML IV SOLN
10.0000 mg | Freq: Once | INTRAVENOUS | Status: AC
  Administered 2015-09-27: 10 mg via INTRAVENOUS
  Filled 2015-09-27: qty 5

## 2015-09-27 NOTE — Progress Notes (Signed)
Echocardiogram 2D Echocardiogram has been performed.  Jason Fisher 09/27/2015, 11:35 AM

## 2015-09-27 NOTE — Research (Signed)
Patient states "I am doing much better this morning." Biofreedom Sten Card given to patient. Patient reminded about follow up visits with [facility]. He will receive a phone call for follow up in one month. [facility] given. Study Coordinator also revisited his dietary sodium intake as we spoke about it yesterday before his procedure. He has stated he was eating large quantities of canned soup. Discussed a 2gm sodium diet with him and how to read food labels. Very pleasant and verbalized appreciation for care.

## 2015-09-27 NOTE — Progress Notes (Signed)
CARDIAC REHAB PHASE I   PRE:  Rate/Rhythm: 97 a fib  BP:  Sitting: 157/87        SaO2: 95 RA  MODE:  Ambulation: 700 ft   POST:  Rate/Rhythm: 107 a fib  BP:  Sitting: 173/87         SaO2: 96 RA  Pt ambulated 700 ft on RA, handheld assist, steady gait, tolerated well with no complaints. Completed PCI/stent education.  Reviewed risk factors, tobacco cessation (gave pt fake cigarette), anti-platelet therapy, stent card, activity restrictions, ntg, exercise, heart healthy diet, carb counting, portion control, sodium restrictions, daily weights, s/s heart failure, and phase 2 cardiac rehab. Pt verbalized understanding. Pt agrees to phase 2 cardiac rehab referral, will send to [facility] per pt request. Pt to recliner after walk, call bell within reach. Will follow.   [id number] Lenna Sciara, RN, BSN 09/27/2015 9:00 AM

## 2015-09-27 NOTE — Progress Notes (Signed)
Placed patient on 2L nasal cannula following SpO2 of 88%. Patient is tolerating well and RT will continue to monitor.

## 2015-09-27 NOTE — Progress Notes (Signed)
Inpatient Diabetes Program Recommendations  AACE/ADA: New Consensus Statement on Inpatient Glycemic Control (2015)  Target Ranges:  Prepandial:   less than 140 mg/dL      Peak postprandial:   less than 180 mg/dL (1-2 hours)      Critically ill patients:  140 - 180 mg/dL   Results for TYMARI, BEK (MRN [id number]) as of 09/27/2015 09:11  Ref. Range 09/26/2015 06:22 09/26/2015 14:08 09/26/2015 17:38 09/26/2015 21:27 09/27/2015 08:03  Glucose-Capillary Latest Ref Range: 65 - 99 mg/dL 122 (H) 170 (H) 204 (H) 247 (H) 199 (H)   Review of Glycemic Control  Diabetes history: DM2 Outpatient Diabetes medications: Actos 45 mg daily, Victoza 1.8 mg daily, Metformin 1000 mg BID, Amaryl 4 mg QAM Current orders for Inpatient glycemic control: Novolog 0-9 units TID with meals, Novolog 0-5 units QHS  Inpatient Diabetes Program Recommendations: Insulin - Meal Coverage: If steroids are continued, please consider ordering Novolog 3 units TID with meals for meal coverage. Oral DM medications: Please note that patient takes Actos as an outpatient which can worsen CHF due to side effect of fluid retention. MD, please consider discontinuing Actos at time of discharge.  Thanks, Barnie Alderman, RN, MSN, CDE Diabetes Coordinator Inpatient Diabetes Program [phone] (Team Pager from [place] to [place]) [phone] ([place] office) [phone] [facility] office) [phone] [facility] office)

## 2015-09-27 NOTE — Progress Notes (Addendum)
Patient Name: Jason Fisher Date of Encounter: 09/27/2015  Principal Problem:   Acute on chronic combined systolic and diastolic congestive heart failure ([place]) Active Problems:   CAD S/P percutaneous coronary angioplasty   Progressive angina ([place])   Acute respiratory failure with hypoxia (HCC)   Essential hypertension   Chronic atrial fibrillation ([place]): CHA2DS2Vasc = 5. On Warfarin. Rate control   DM (diabetes mellitus), type 2 with peripheral vascular complications (HCC)   Dyslipidemia   Abdominal aortic aneurysm (HCC)   TOBACCO USER   Chest pain   SOB (shortness of breath)   CKD (chronic kidney disease), stage III   Diastolic congestive heart failure (HCC)   Paroxysmal atrial fibrillation (HCC)   Unstable angina (HCC)   Allergic reaction to contrast dye   Accelerated hypertension with diastolic congestive heart failure, NYHA class 3 [facility])   Primary Cardiologist: New, saw Dr. Verl Blalock in 2013.  Patient Profile: Jason Fisher is a very pleasant 75 year old gentleman with history of known coronary disease status post PCI to the RCA in 2007 & 2009.Small non-STEMI in 2002: Only moderate CAD noted. No culprit lesion found. Cardiac catheterization 2007: PCI 2 to the RCA for inferior MI - vision BMS 2. Last catheterization was August 2009 for abnormal Myoview: He had PCI with 3.0 x 20 mm Promus DES stent for in-stent restenosis in pRCA (3.5 mm). Also with history of HTN, HLD, DM, AAA, Afib (On Eliquis), and CKD stage III. Presented with chest pain on 09/25/15.   Yesterday's events noted: Cardiac catheterization found OM lesion treated with DES stent. Post cath is noted to have presumed contrast reaction. He was profoundly hypertensive during the case and had acute respiratory distress. Treated with IV Solu-Medrol, Benadryl as well as significant doses of IV Lasix and has diuresed well overnight.  SUBJECTIVE: Feels well today. No further chest discomfort. Is ready to ambulate. Feels like  yesterday's events have resolved.  OBJECTIVE Vitals:   09/27/15 0812 09/27/15 0814 09/27/15 0818 09/27/15 0822  BP:      Pulse:      Resp:      Temp:      TempSrc:      SpO2: 95% 99% 99% 100%  Weight:      Height:        Intake/Output Summary (Last 24 hours) at 09/27/15 0918 Last data filed at 09/27/15 0700  Gross per 24 hour  Intake           454.12 ml  Output             4765 ml  Net         -4310.88 ml   Filed Weights   09/25/15 0309 09/26/15 0401 09/27/15 0359  Weight: 207 lb 4.8 oz (94 kg) 206 lb 6.4 oz (93.6 kg) 198 lb 6.6 oz (90 kg)    PHYSICAL EXAM General: Well developed, well nourished, male in no acute distress. Head: Normocephalic, atraumatic.  Neck: Supple without bruits, no JVD. Lungs:  Resp regular and unlabored, crackles in right lower lobe.  Heart: RRR, S1, S2, no S3, S4, or murmur; no rub. Abdomen: Soft, non-tender, non-distended, BS + x 4.  Extremities: No clubbing, cyanosis, +1 pretibial edema.  Neuro: Alert and oriented X 3. Moves all extremities spontaneously. Psych: Normal affect.  LABS: CBC:  Recent Labs  09/26/15 0227 09/27/15 0154  WBC 7.2 9.4  HGB 13.2 13.8  HCT 42.1 42.9  MCV 92.5 89.6  PLT 143* 171   INR:  Recent Labs  09/25/15 0221  INR [id number]   Basic Metabolic Panel:  Recent Labs  09/26/15 0227 09/27/15 0154  NA 142 140  K 4.0 3.0*  CL 106 99*  CO2 29 29  GLUCOSE 102* 171*  BUN 21* 20  CREATININE 1.28* 1.40*  CALCIUM 9.2 9.2  MG 2.2 2.1   Cardiac Enzymes:  Recent Labs  09/25/15 1246 09/26/15 1030 09/27/15 0154  TROPONINI <0.03 <0.03 0.06*    Recent Labs  09/24/15 2100  TROPIPOC 0.00   BNP:  B Natriuretic Peptide  Date/Time Value Ref Range Status  09/24/2015 08:38 PM 155.5 (H) 0.0 - 100.0 pg/mL Final  Hemoglobin A1C:  Recent Labs  09/25/15 0222  HGBA1C 6.7*   Fasting Lipid Panel:  Recent Labs  09/25/15 0221  CHOL 137  HDL 47  LDLCALC 77  TRIG 64  CHOLHDL 2.9    . nitroGLYCERIN  Stopped (09/26/15 1315)   TELE:   Atrial fibrillation rate controlled.    Radiology/Studies: Dg Chest Port 1 View  Result Date: 09/26/2015 CLINICAL DATA:  Post heart catheterization.  Cough. EXAM: PORTABLE CHEST 1 VIEW COMPARISON:  September 24, 2015 FINDINGS: Mild cardiomegaly. The hila and mediastinum are unremarkable. No pulmonary nodules, masses, or focal infiltrates. No overt pulmonary edema. IMPRESSION: No active disease. Electronically Signed   By: Dorise Bullion III M.D   On: 09/26/2015 15:07     Current Medications:  . apixaban  5 mg Oral BID  . arformoterol  15 mcg Nebulization BID  . aspirin  81 mg Oral Daily  . atorvastatin  20 mg Oral q1800  . budesonide (PULMICORT) nebulizer solution  0.25 mg Nebulization BID  . clopidogrel  75 mg Oral Q breakfast  . clotrimazole  1 application Topical BID  . diltiazem  120 mg Oral Daily  . diltiazem  120 mg Oral Once  . diphenhydrAMINE  25 mg Oral TID  . famotidine (PEPCID) IV  20 mg Intravenous Q12H  . fluticasone  2 spray Each Nare Daily  . furosemide  40 mg Intravenous BID  . [START ON 09/28/2015] furosemide  40 mg Oral Daily  . insulin aspart  0-5 Units Subcutaneous QHS  . insulin aspart  0-9 Units Subcutaneous TID WC  . insulin glargine  10 Units Subcutaneous BH-q7a  . ipratropium  0.5 mg Nebulization Q6H  . levalbuterol  0.63 mg Nebulization Q6H  . methylPREDNISolone (SOLU-MEDROL) injection  80 mg Intravenous Q6H  . metoprolol  100 mg Oral BID  . nicotine  21 mg Transdermal Daily  . potassium chloride  40 mEq Oral Q4H  . ramipril  10 mg Oral Daily  . sodium chloride flush  3 mL Intravenous Q12H   . nitroGLYCERIN Stopped (09/26/15 1315)   CARDIAC STUDIES Procedures   Coronary Stent Intervention  Left Heart Cath and Coronary Angiography  Conclusion     2nd Mrg lesion, 90 %stenosed.  A drug eluting stent was successfully placed.  Post intervention, there is a 0% residual stenosis. -> Successful stenting of the second  OM with a Biofreedom stent.   Prox LAD to Mid LAD lesion, 30 %stenosed.  1st Diag lesion, 90 %stenosed.  Ost 1st Mrg to 1st Mrg lesion, 80 %stenosed.  Ost RCA to Prox RCA lesion, 30 %stenosed.  Mid RCA-2 lesion, 40 %stenosed.  Mid RCA-1 lesion, 15 %stenosed.  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  There is mild left ventricular systolic dysfunction.  The left ventricular ejection  fraction is 50-55% by visual estimate.  LV end diastolic pressure is moderately elevated.    Allergic response to contrast with hives and bronchospasm.       Echocardiogram pending   ASSESSMENT AND PLAN: Principal Problem:   Acute on chronic combined systolic and diastolic congestive heart failure (HCC) Active Problems:   CAD S/P percutaneous coronary angioplasty   Progressive angina (HCC)   Acute respiratory failure with hypoxia (HCC)   Essential hypertension   Chronic atrial fibrillation ([place]): CHA2DS2Vasc = 5. On Warfarin. Rate control   DM (diabetes mellitus), type 2 with peripheral vascular complications (HCC)   Dyslipidemia   Abdominal aortic aneurysm (HCC)   TOBACCO USER   Chest pain   SOB (shortness of breath)   CKD (chronic kidney disease), stage III   Diastolic congestive heart failure (HCC)   Paroxysmal atrial fibrillation (HCC)   Unstable angina (HCC)   Allergic reaction to contrast dye   Accelerated hypertension with diastolic congestive heart failure, NYHA class 3 ([place])   1. Progressive angina with history of CAD - : Has history of CAD, most recently had left heart cath in 2009 after abnormal Myoview, had DES placed to [facility] for in-stent restenosis. Presented with chest pain and exertional dyspnea for 2 months.   Cardiac cath showed small diagonal branch and 90% stenosis and OM2 branch with 90% stenosis treated with Biofreedom DES stent  2. Acute on chronic combined systolic and diastolic CHF: Echo pending for this admission, last was in 2011,  had normal EF at that time. Volume overloaded on exam, endorsed orthopnea and PND. Chest x ray consistent with CHF.   He had significantly elevated LVEDP yesterday cardiac catheterization --> in the setting of a contrast reaction he had acute respiratory distress with hypoxia --> was treated with IV steroids as well as IV Lasix.  Diuresed well overnight, will continue IV Lasix today and switch to by mouth tomorrow  3. HTN: hypertensive, on beta blocker and ACE-I. Creatinine is elevated at 1.28.   Will increase diltiazem to 240 mg daily (ordered only for today to see the effect on his blood pressure - would need to order as a standing daily order if he tolerates)  4. Permanent atrial fibrillation: Rate controlled on diltiazem and beta blocker. --> Rate is little fast today. Appears that he did not receive his diltiazem or his beta blocker yesterday morning.  This patients CHA2DS2-VASc Score and unadjusted Ischemic Stroke Rate (% per year) is equal to 7.2 % stroke rate/year from a score of 5 Above score calculated as 1 point each if present [CHF, HTN, DM, Vascular=MI/PAD/Aortic Plaque, Age if 65-74, or Male], 2 points each if present [Age > 75, or Stroke/TIA/TE] -- On Eliquis for anticoagulation.   We'll give 1 dose of IV diltiazem in the day as his blood pressure remains over 150 and heart rate in the 90s. We'll then increase his diltiazem 240 mg  Continue current dose of metoprolol.  5. CKD stage III: baseline creatinine is 1.2-1.3. Stable.   6. Tobacco abuse: Encouraged cessation.   7.  acute respiratory distress with hypoxia --> was treated with IV steroids as well as IV Lasix. I suspect that a good portion of this is related to underlying lung disease and COPD.  Currently on steroids and inhalers per Dr. [place] Silos from medicine service. --> Defer further management to him.     Signed,  Glenetta Hew, M.D., M.S. Interventional Cardiologist   Pager # [phone] Phone #  [phone]  [address] [place], [place] [address]   Addendum: After the IV dose of diltiazem, his blood pressure actually came down to a very normal range. We will therefore just go back to standing dose of 120 mg diltiazem orally. He angulated with cardiac rehabilitation, is probably okay for transfer out to telemetry later on this afternoon.  Glenetta Hew, MD

## 2015-09-27 NOTE — Progress Notes (Signed)
PROGRESS NOTE    Jason Fisher  [id number] DOB: August 31, 1940 DOA: 09/25/2015 PCP: Nyoka Cowden, MD    Brief Narrative:   Jason Fisher is a 75 y.o. male with medical history significant of hypertension, hyperlipidemia, diabetes mellitus, CAD, MI, s/p of stent, Afib on Eliquis, AAA, CKD-III, who presents with a shortness of breath, chest pain and leg edema.  Patient reports that he has been having intermittent chest pain for about 2 month. The chest pain is located in the front chest, pressure-like, nonradiating, 5 out of 10 in severity. No tenderness over calf areas. Patient has cough with clear mucus production. In the past 2 days, he noted bilateral leg edema which is new for patient. No fever or chills. Patient denies nausea, vomiting, abdominal pain, symptoms of UTI, diarrhea, unilateral weakness.  ED Course: pt was found to have BNP 155.5, negative troponin, WBC 6.6, temperature normal, stable renal function. Chest x-ray showed vascular congestion consistent with CHF. Patient is placed on telemetry bed for observation.  Patient was admitted placed on IV diuretics for diuresis with some clinical improvement. Cardiology consultation was obtained and patient underwent cardiac catheterization on 09/26/2015 which showed a new 90% stenosis in the second OM status post stenting with bio Freet on stent. Patient developed an allergic reaction to the contrast dye with hives and bronchospasms was transferred to the stepdown unit placed on IV Solu-Medrol, IV Pepcid and Benadryl with clinical improvement.    Assessment & Plan:   Principal Problem:   Acute on chronic combined systolic and diastolic congestive heart failure (HCC) Active Problems:   Chest pain   Allergic reaction to contrast dye   DM (diabetes mellitus), type 2 with peripheral vascular complications (HCC)   Dyslipidemia   Essential hypertension   CAD S/P percutaneous coronary angioplasty   Chronic atrial  fibrillation ([place]): CHA2DS2Vasc = 5. On Warfarin. Rate control   Abdominal aortic aneurysm (HCC)   TOBACCO USER   SOB (shortness of breath)   CKD (chronic kidney disease), stage III   Progressive angina (HCC)   Diastolic congestive heart failure (HCC)   Paroxysmal atrial fibrillation (HCC)   Unstable angina (HCC)   Accelerated hypertension with diastolic congestive heart failure, NYHA class 3 (HCC)  #1 acute on chronic combined systolic and diastolic heart failure Questionable etiology.Likely secondary to acute coronary syndrome as patient with complaints of chest pain on exertion and occasionally at rest. Patient also noted on admission to have some orthopnea with lower extremity edema and shortness of breath. Patient with clinical improvement. Strict I's and O have not been recorded accurately. Patient is -4.3 L over the past 24 hours. Patient's weight today is 90 kg from 93.62 kg from 94.031 kg(09/25/2015) from 95.709 kg (09/24/2015). On 09/06/2015 patient's weight was 92.26 kg during the office visit. Patient with clinical improvement. Cardiac catheterization noted to have a new 90% stenosis and large second OM status post stenting with bioftreedom stent. Continue Lasix 40 mg IV every 12 hours, Lopressor, Lipitor, Cardizem, ramipril, Plavix, aspirin, Eliquis. Cardiology following and appreciate input and recommendations.  #2 unstable angina/3 vessel coronary artery disease status post stenting to new 90% stenosis and large second OM 09/26/2015 Patient presented with chest pain ongoing for the past 2 months worse on exertion with associated shortness of breath and concern for angina. Patient with history of coronary artery disease with cath in 2009 with DES placed to RCA for in-stent restenosis. Patient with clinical improvement after diureses. Patient has been seen in consultation by  cardiology and patient s/p cardiac catheterization 09/26/2015 with new 90% stenosis in the large second OM status  post stenting of second OM with a Biofreedom stent, chronic severe stenosis in the first diagonal, stents in proximal and mid RCA are patent, aneurysm or stenosis in the PL OM branch of the RCA-the aneurysm is new compared to 2009, mild LV dysfunction, elevated LVEDP.  2-D echo pending. Continue Lopressor, Lipitor, IV Lasix, Cardizem, ramipril, eliquis, ASA, plavix. Cardiology following and appreciate input and recommendations.  #3 allergic reaction to contrast dye Patient noted to have an allergic reaction to contrast dye during cardiac catheterization on 09/26/2015 with hives and bronchospasms. Patient was placed on nebulizer treatments, IV steroids, IV Pepcid, Benadryl with clinical improvement. Patient currently 97% on room air with no increased work of breathing. Change Benadryl to 25 mg by mouth twice a day. Continue Pepcid 20 mg IV every 12 hours. Change Solu-Medrol to 80 mg every 8 hours. Continue scheduled nebulizers, Pulmicort and Brovana. Follow.  #4 hypertension Blood pressure this morning 163/115. Patient has just received BP medications of Lopressor, ramipril, Cardizem. Cardiology adjusting patient's Cardizem. Follow.   #5 atrial fibrillation  CHA2DS2VASC  Score 5. Patient was rate controlled on Lopressor and Cardizem. Patient was on IV heparin precardiac catheterization. Eliquis has been resumed. Follow.  #6 chronic kidney disease stage III Baseline creatinine 1.2-1.3. Creatinine currently at 1.40. Monitor closely with diuretics. Follow.  #7 hyperlipidemia LDL was 91 08/30/2015 Continue Lipitor.  #8 diabetes mellitus type 2 Hemoglobin A1c 6.7 on 08/24/2015. CBGs 199-247. Elevated CBGs likely in part due to steroids. Hold oral hypoglycemic agents. Start Lantus 10 units daily. Sliding scale insulin.  #9 tobacco abuse Tobacco cessation. Nicotine patch.     DVT prophylaxis: eliquis Code Status: Full Family Communication: Updated patient. No family at bedside. Disposition  Plan: Remain in the stepdown unit. Home when medically stable. Per cardiology.   Consultants:   Cardiology: Dr. Ellyn Hack 09/25/2015  Procedures:   Chest x-ray 09/24/2015  Cardiac catheterization 09/26/2015--- three-vessel obstructive coronary artery disease, chronic severe stenosis in the first diagonal, new 90% stenosis in a large second OM status post stenting with bio Freedom stent, stents in the proximal and mid RCA are patent, aneurysm and stenosis in the PL OM branch of the RCA-aneurysm is new compared to 2009, mild LV dysfunction, elevated LVEDP, allergic response to contrast with hives and bronchospasms.  Antimicrobials:  None   Subjective: Patient sitting up in chair after eating breakfast. No chest pain. No shortness of breath. Asking when he can go home. Patient noted to be tachycardic this morning and received IV Cardizem 10mg  x 1, currently heart rate in the 90s.  Objective: Vitals:   09/27/15 0812 09/27/15 0814 09/27/15 0818 09/27/15 0822  BP:      Pulse:      Resp:      Temp:      TempSrc:      SpO2: 95% 99% 99% 100%  Weight:      Height:        Intake/Output Summary (Last 24 hours) at 09/27/15 0917 Last data filed at 09/27/15 0700  Gross per 24 hour  Intake           454.12 ml  Output             4765 ml  Net         -4310.88 ml   Filed Weights   09/25/15 0309 09/26/15 0401 09/27/15 0359  Weight: 94 kg (207 lb  4.8 oz) 93.6 kg (206 lb 6.4 oz) 90 kg (198 lb 6.6 oz)    Examination:  General exam: Appears calm and comfortable  Respiratory system: CTAB. No wheezing. Respiratory effort normal. Cardiovascular system: S1 & S2 heard, RRR. No JVD, murmurs, rubs, gallops or clicks.No lower extremity edema Gastrointestinal system: Abdomen is nondistended, soft and nontender. No organomegaly or masses felt. Normal bowel sounds heard. Central nervous system: Alert and oriented. No focal neurological deficits. Extremities: Symmetric 5 x 5 power. Skin: No rashes,  lesions or ulcers Psychiatry: Judgement and insight appear normal. Mood & affect appropriate.     Data Reviewed: I have personally reviewed following labs and imaging studies  CBC:  Recent Labs Lab 09/24/15 2038 09/26/15 0227 09/27/15 0154  WBC 6.6 7.2 9.4  HGB 13.1 13.2 13.8  HCT 41.8 42.1 42.9  MCV 92.3 92.5 89.6  PLT 148* 143* [id number]   Basic Metabolic Panel:  Recent Labs Lab 09/24/15 2038 09/25/15 0815 09/26/15 0227 09/27/15 0154  NA 140  --  142 140  K 3.6  --  4.0 3.0*  CL 108  --  106 99*  CO2 27  --  29 29  GLUCOSE 181*  --  102* 171*  BUN 22*  --  21* 20  CREATININE 1.25*  --  1.28* 1.40*  CALCIUM 9.3  --  9.2 9.2  MG  --  2.1 2.2 2.1   GFR: Estimated Creatinine Clearance: 50.4 mL/min (by C-G formula based on SCr of 1.4 mg/dL). Liver Function Tests: No results for input(s): AST, ALT, ALKPHOS, BILITOT, PROT, ALBUMIN in the last 168 hours. No results for input(s): LIPASE, AMYLASE in the last 168 hours. No results for input(s): AMMONIA in the last 168 hours. Coagulation Profile:  Recent Labs Lab 09/25/15 0221  INR 1.22   Cardiac Enzymes:  Recent Labs Lab 09/25/15 0221 09/25/15 0815 09/25/15 1246 09/26/15 1030 09/27/15 0154  TROPONINI <0.03 <0.03 <0.03 <0.03 0.06*   BNP (last 3 results) No results for input(s): PROBNP in the last 8760 hours. HbA1C:  Recent Labs  09/25/15 0222  HGBA1C 6.7*   CBG:  Recent Labs Lab 09/26/15 0622 09/26/15 1408 09/26/15 1738 09/26/15 2127 09/27/15 0803  GLUCAP 122* 170* 204* 247* 199*   Lipid Profile:  Recent Labs  09/25/15 0221  CHOL 137  HDL 47  LDLCALC 77  TRIG 64  CHOLHDL 2.9   Thyroid Function Tests: No results for input(s): TSH, T4TOTAL, FREET4, T3FREE, THYROIDAB in the last 72 hours. Anemia Panel: No results for input(s): VITAMINB12, FOLATE, FERRITIN, TIBC, IRON, RETICCTPCT in the last 72 hours. Sepsis Labs: No results for input(s): PROCALCITON, LATICACIDVEN in the last 168  hours.  Recent Results (from the past 240 hour(s))  MRSA PCR Screening     Status: None   Collection Time: 09/26/15  1:30 PM  Result Value Ref Range Status   MRSA by PCR NEGATIVE NEGATIVE Final    Comment:        The GeneXpert MRSA Assay (FDA approved for NASAL specimens only), is one component of a comprehensive MRSA colonization surveillance program. It is not intended to diagnose MRSA infection nor to guide or monitor treatment for MRSA infections.          Radiology Studies: Dg Chest Port 1 View  Result Date: 09/26/2015 CLINICAL DATA:  Post heart catheterization.  Cough. EXAM: PORTABLE CHEST 1 VIEW COMPARISON:  September 24, 2015 FINDINGS: Mild cardiomegaly. The hila and mediastinum are unremarkable. No pulmonary nodules, masses, or focal  infiltrates. No overt pulmonary edema. IMPRESSION: No active disease. Electronically Signed   By: Dorise Bullion III M.D   On: 09/26/2015 15:07        Scheduled Meds: . apixaban  5 mg Oral BID  . arformoterol  15 mcg Nebulization BID  . aspirin  81 mg Oral Daily  . atorvastatin  20 mg Oral q1800  . budesonide (PULMICORT) nebulizer solution  0.25 mg Nebulization BID  . clopidogrel  75 mg Oral Q breakfast  . clotrimazole  1 application Topical BID  . diltiazem  120 mg Oral Daily  . diltiazem  120 mg Oral Once  . diphenhydrAMINE  25 mg Oral TID  . famotidine (PEPCID) IV  20 mg Intravenous Q12H  . fluticasone  2 spray Each Nare Daily  . furosemide  40 mg Intravenous BID  . [START ON 09/28/2015] furosemide  40 mg Oral Daily  . insulin aspart  0-5 Units Subcutaneous QHS  . insulin aspart  0-9 Units Subcutaneous TID WC  . insulin glargine  10 Units Subcutaneous BH-q7a  . ipratropium  0.5 mg Nebulization Q6H  . levalbuterol  0.63 mg Nebulization Q6H  . methylPREDNISolone (SOLU-MEDROL) injection  80 mg Intravenous Q6H  . metoprolol  100 mg Oral BID  . nicotine  21 mg Transdermal Daily  . potassium chloride  40 mEq Oral Q4H  .  ramipril  10 mg Oral Daily  . sodium chloride flush  3 mL Intravenous Q12H   Continuous Infusions: . nitroGLYCERIN Stopped (09/26/15 1315)     LOS: 1 day    Time spent: 28 mins    Gennette Shadix, MD [facility] Pager [phone]  If 7PM-7AM, please contact night-coverage [email or web address] Password [id number] 09/27/2015, 9:17 AM [email or web address]

## 2015-09-28 LAB — BASIC METABOLIC PANEL
Anion gap: 9 (ref 5–15)
BUN: 35 mg/dL — AB (ref 6–20)
CHLORIDE: 103 mmol/L (ref 101–111)
CO2: 29 mmol/L (ref 22–32)
Calcium: 9.3 mg/dL (ref 8.9–10.3)
Creatinine, Ser: 1.66 mg/dL — ABNORMAL HIGH (ref 0.61–1.24)
GFR calc Af Amer: 45 mL/min — ABNORMAL LOW (ref >60)
GFR calc non Af Amer: 39 mL/min — ABNORMAL LOW (ref >60)
Glucose, Bld: 190 mg/dL — ABNORMAL HIGH (ref 65–99)
POTASSIUM: 3.8 mmol/L (ref 3.5–5.1)
SODIUM: 141 mmol/L (ref 135–145)

## 2015-09-28 LAB — GLUCOSE, CAPILLARY
GLUCOSE-CAPILLARY: 217 mg/dL — AB (ref 65–99)
GLUCOSE-CAPILLARY: 233 mg/dL — AB (ref 65–99)
GLUCOSE-CAPILLARY: 254 mg/dL — AB (ref 65–99)
Glucose-Capillary: 189 mg/dL — ABNORMAL HIGH (ref 65–99)
Glucose-Capillary: 298 mg/dL — ABNORMAL HIGH (ref 65–99)
Glucose-Capillary: 304 mg/dL — ABNORMAL HIGH (ref 65–99)

## 2015-09-28 MED ORDER — FAMOTIDINE 20 MG PO TABS
20.0000 mg | ORAL_TABLET | Freq: Two times a day (BID) | ORAL | Status: DC
  Administered 2015-09-28 – 2015-09-30 (×5): 20 mg via ORAL
  Filled 2015-09-28 (×5): qty 1

## 2015-09-28 MED ORDER — METHYLPREDNISOLONE SODIUM SUCC 125 MG IJ SOLR
80.0000 mg | Freq: Two times a day (BID) | INTRAMUSCULAR | Status: DC
  Administered 2015-09-28 – 2015-09-29 (×2): 80 mg via INTRAVENOUS
  Filled 2015-09-28 (×3): qty 2

## 2015-09-28 NOTE — Progress Notes (Signed)
CARDIAC REHAB PHASE I   PRE:  Rate/Rhythm: 107  BP:  Sitting: 131/64     SaO2: 99  MODE:  Ambulation: 700 ft   POST:  Rate/Rhythm: 120  BP:  Sitting: 148/85     SaO2: 100%  10:25am-10:40am Patient ambulated independently at a steady pace. No complaints. Encouraged to walk twice a day. Placed in chair with call bell in reach.   [place], MS 09/28/2015 10:38 AM

## 2015-09-28 NOTE — Progress Notes (Signed)
Transfer report received from [place] at 1500 and pt arrived to the unit via wheelchair with belongings to the side at 1550. Pt A&O x4; VSS; telemetry applied and verified with CCMD, NT called and second verified. Pt oriented to the unit and room; call light within reach; fall/safety precaution and prevention education completed with pt. No pressure ulcer noted on pt. Pt resting comfortably in bed with call light within reach. Jason Heady RN

## 2015-09-28 NOTE — Progress Notes (Signed)
PROGRESS NOTE    Jason Fisher  [id number] DOB: 04/21/1940 DOA: 09/25/2015 PCP: Nyoka Cowden, MD    Brief Narrative:   Jason Fisher is a 75 y.o. male with medical history significant of hypertension, hyperlipidemia, diabetes mellitus, CAD, MI, s/p of stent, Afib on Eliquis, AAA, CKD-III, who presents with a shortness of breath, chest pain and leg edema.  Patient reports that he has been having intermittent chest pain for about 2 month. The chest pain is located in the front chest, pressure-like, nonradiating, 5 out of 10 in severity. No tenderness over calf areas. Patient has cough with clear mucus production. In the past 2 days, he noted bilateral leg edema which is new for patient. No fever or chills. Patient denies nausea, vomiting, abdominal pain, symptoms of UTI, diarrhea, unilateral weakness.  ED Course: pt was found to have BNP 155.5, negative troponin, WBC 6.6, temperature normal, stable renal function. Chest x-ray showed vascular congestion consistent with CHF. Patient is placed on telemetry bed for observation.  Patient was admitted placed on IV diuretics for diuresis with some clinical improvement. Cardiology consultation was obtained and patient underwent cardiac catheterization on 09/26/2015 which showed a new 90% stenosis in the second OM status post stenting with bio Freet on stent. Patient developed an allergic reaction to the contrast dye with hives and bronchospasms was transferred to the stepdown unit placed on IV Solu-Medrol, IV Pepcid and Benadryl with clinical improvement.    Assessment & Plan:   Principal Problem:   Acute on chronic combined systolic and diastolic congestive heart failure (HCC) Active Problems:   Chest pain   Allergic reaction to contrast dye   DM (diabetes mellitus), type 2 with peripheral vascular complications (HCC)   Dyslipidemia   Essential hypertension   CAD S/P percutaneous coronary angioplasty   Chronic atrial  fibrillation ([place]): CHA2DS2Vasc = 5. On Warfarin. Rate control   Abdominal aortic aneurysm (HCC)   TOBACCO USER   SOB (shortness of breath)   CKD (chronic kidney disease), stage III   Progressive angina (HCC)   Diastolic congestive heart failure (HCC)   Paroxysmal atrial fibrillation (HCC)   Unstable angina (HCC)   Accelerated hypertension with diastolic congestive heart failure, NYHA class 3 (HCC)   Acute respiratory failure with hypoxia (HCC)  #1 acute on chronic combined systolic and diastolic heart failure Questionable etiology.Likely secondary to acute coronary syndrome as patient with complaints of chest pain on exertion and occasionally at rest. Patient also noted on admission to have some orthopnea with lower extremity edema and shortness of breath. Patient with clinical improvement. Strict I's and O have not been recorded accurately initially during the hospitalization. Patient is -4.79 L during this hospitalization. Patient's weight today is 90.1 kg from 90 kg from 93.62 kg from 94.031 kg(09/25/2015) from 95.709 kg (09/24/2015). On 09/06/2015 patient's weight was 92.26 kg during the office visit. Patient with clinical improvement. Cardiac catheterization noted to have a new 90% stenosis and large second OM status post stenting with bioftreedom stent. Continue Lopressor, Lipitor, Cardizem, ramipril, Plavix, aspirin, Eliquis. IV Lasix has been changed to oral Lasix per cardiology. Cardiology following and appreciate input and recommendations.  #2 unstable angina/3 vessel coronary artery disease status post stenting to new 90% stenosis and large second OM 09/26/2015 Patient presented with chest pain ongoing for the past 2 months worse on exertion with associated shortness of breath and concern for angina. Patient with history of coronary artery disease with cath in 2009 with DES placed to RCA  for in-stent restenosis. Patient with clinical improvement after diureses and stent placement. Patient  has been seen in consultation by cardiology and patient s/p cardiac catheterization 09/26/2015 with new 90% stenosis in the large second OM status post stenting of second OM with a Biofreedom stent, chronic severe stenosis in the first diagonal, stents in proximal and mid RCA are patent, aneurysm or stenosis in the PL OM branch of the RCA-the aneurysm is new compared to 2009, mild LV dysfunction, elevated LVEDP.  2-D echo with EF of 55-60% with no wall motion abnormalities. Continue Lopressor, Lipitor, Cardizem, ramipril, eliquis, ASA, plavix. IV Lasix has been changed to oral Lasix. Cardiology following and appreciate input and recommendations.  #3 allergic reaction to contrast dye Patient noted to have an allergic reaction to contrast dye during cardiac catheterization on 09/26/2015 with hives and bronchospasms. Patient was placed on nebulizer treatments, IV steroids, IV Pepcid, Benadryl with clinical improvement. Patient currently 98% on room air with no increased work of breathing. Continue Benadryl to 25 mg by mouth twice a day. Change Pepcid 20 mg IV every 12 hours to oral. Change Solu-Medrol to 80 mg every 12 hours. Continue scheduled nebulizers, Pulmicort and Brovana. Follow.  #4 hypertension Blood pressure this morning 157/85. Patient has not received BP medications yet. Continue Lopressor, ramipril, Cardizem. Follow.   #5 atrial fibrillation  CHA2DS2VASC  Score 5. Patient was rate controlled on Lopressor and Cardizem. Patient was on IV heparin precardiac catheterization. Eliquis has been resumed. Per cardiology.  #6 chronic kidney disease stage III Baseline creatinine 1.2-1.3. Creatinine slowly climbing up and currently at 1.66 from 1.40 from 1.28. IV diuretics have been changed to oral diuretics this morning. Monitor closely with diuretics. Follow.  #7 hyperlipidemia LDL was 91 08/30/2015 Continue Lipitor.  #8 diabetes mellitus type 2 Hemoglobin A1c 6.7 on 08/24/2015. CBGs [id number].  Elevated CBGs likely in part due to steroids. Hold oral hypoglycemic agents. Continue Lantus 10 units daily. Sliding scale insulin.  #9 tobacco abuse Tobacco cessation. Nicotine patch.     DVT prophylaxis: eliquis Code Status: Full Family Communication: Updated patient. No family at bedside. Disposition Plan: Transfer to telemetry. Home when medically stable and per cardiology.   Consultants:   Cardiology: Dr. Ellyn Hack 09/25/2015  Procedures:   Chest x-ray 09/24/2015  Cardiac catheterization 09/26/2015--- three-vessel obstructive coronary artery disease, chronic severe stenosis in the first diagonal, new 90% stenosis in a large second OM status post stenting with bio Freedom stent, stents in the proximal and mid RCA are patent, aneurysm and stenosis in the PL OM branch of the RCA-aneurysm is new compared to 2009, mild LV dysfunction, elevated LVEDP, allergic response to contrast with hives and bronchospasms.  2-D echo 09/27/2015  Antimicrobials:  None   Subjective: Patient sitting up in chair getting breathing treatment. Patient denies any chest pain. No shortness of breath.  Objective: Vitals:   09/28/15 0703 09/28/15 0805 09/28/15 0815 09/28/15 0838  BP:  (!) 157/85    Pulse: 81 93    Resp:      Temp:    97.3 F (36.3 C)  TempSrc:    Oral  SpO2: 95% 100% 98%   Weight:      Height:        Intake/Output Summary (Last 24 hours) at 09/28/15 0850 Last data filed at 09/28/15 0600  Gross per 24 hour  Intake              500 ml  Output  1100 ml  Net             -600 ml   Filed Weights   09/26/15 0401 09/27/15 0359 09/28/15 0354  Weight: 93.6 kg (206 lb 6.4 oz) 90 kg (198 lb 6.6 oz) 90.1 kg (198 lb 10.2 oz)    Examination:  General exam: Appears calm and comfortable. Getting breathing treatment.  Respiratory system: CTAB. No wheezing. Respiratory effort normal. Cardiovascular system: S1 & S2 heard, RRR. No JVD, murmurs, rubs, gallops or clicks.No  lower extremity edema Gastrointestinal system: Abdomen is nondistended, soft and nontender. No organomegaly or masses felt. Normal bowel sounds heard. Central nervous system: Alert and oriented. No focal neurological deficits. Extremities: Symmetric 5 x 5 power. Skin: No rashes, lesions or ulcers Psychiatry: Judgement and insight appear normal. Mood & affect appropriate.     Data Reviewed: I have personally reviewed following labs and imaging studies  CBC:  Recent Labs Lab 09/24/15 2038 09/26/15 0227 09/27/15 0154  WBC 6.6 7.2 9.4  HGB 13.1 13.2 13.8  HCT 41.8 42.1 42.9  MCV 92.3 92.5 89.6  PLT 148* 143* [id number]   Basic Metabolic Panel:  Recent Labs Lab 09/24/15 2038 09/25/15 0815 09/26/15 0227 09/27/15 0154 09/28/15 0248  NA 140  --  142 140 141  K 3.6  --  4.0 3.0* 3.8  CL 108  --  106 99* 103  CO2 27  --  29 29 29   GLUCOSE 181*  --  102* 171* 190*  BUN 22*  --  21* 20 35*  CREATININE 1.25*  --  1.28* 1.40* 1.66*  CALCIUM 9.3  --  9.2 9.2 9.3  MG  --  2.1 2.2 2.1  --    GFR: Estimated Creatinine Clearance: 42.6 mL/min (by C-G formula based on SCr of 1.66 mg/dL). Liver Function Tests: No results for input(s): AST, ALT, ALKPHOS, BILITOT, PROT, ALBUMIN in the last 168 hours. No results for input(s): LIPASE, AMYLASE in the last 168 hours. No results for input(s): AMMONIA in the last 168 hours. Coagulation Profile:  Recent Labs Lab 09/25/15 0221  INR 1.22   Cardiac Enzymes:  Recent Labs Lab 09/25/15 0221 09/25/15 0815 09/25/15 1246 09/26/15 1030 09/27/15 0154  TROPONINI <0.03 <0.03 <0.03 <0.03 0.06*   BNP (last 3 results) No results for input(s): PROBNP in the last 8760 hours. HbA1C: No results for input(s): HGBA1C in the last 72 hours. CBG:  Recent Labs Lab 09/27/15 1632 09/27/15 2005 09/27/15 2334 09/28/15 0352 09/28/15 0835  GLUCAP 158* 324* 217* 189* 233*   Lipid Profile: No results for input(s): CHOL, HDL, LDLCALC, TRIG, CHOLHDL,  LDLDIRECT in the last 72 hours. Thyroid Function Tests: No results for input(s): TSH, T4TOTAL, FREET4, T3FREE, THYROIDAB in the last 72 hours. Anemia Panel: No results for input(s): VITAMINB12, FOLATE, FERRITIN, TIBC, IRON, RETICCTPCT in the last 72 hours. Sepsis Labs: No results for input(s): PROCALCITON, LATICACIDVEN in the last 168 hours.  Recent Results (from the past 240 hour(s))  MRSA PCR Screening     Status: None   Collection Time: 09/26/15  1:30 PM  Result Value Ref Range Status   MRSA by PCR NEGATIVE NEGATIVE Final    Comment:        The GeneXpert MRSA Assay (FDA approved for NASAL specimens only), is one component of a comprehensive MRSA colonization surveillance program. It is not intended to diagnose MRSA infection nor to guide or monitor treatment for MRSA infections.          Radiology  Studies: Dg Chest Port 1 View  Result Date: 09/26/2015 CLINICAL DATA:  Post heart catheterization.  Cough. EXAM: PORTABLE CHEST 1 VIEW COMPARISON:  September 24, 2015 FINDINGS: Mild cardiomegaly. The hila and mediastinum are unremarkable. No pulmonary nodules, masses, or focal infiltrates. No overt pulmonary edema. IMPRESSION: No active disease. Electronically Signed   By: Dorise Bullion III M.D   On: 09/26/2015 15:07        Scheduled Meds: . apixaban  5 mg Oral BID  . arformoterol  15 mcg Nebulization BID  . aspirin  81 mg Oral Daily  . atorvastatin  20 mg Oral q1800  . budesonide (PULMICORT) nebulizer solution  0.25 mg Nebulization BID  . clopidogrel  75 mg Oral Q breakfast  . clotrimazole  1 application Topical BID  . diltiazem  120 mg Oral Daily  . diltiazem  120 mg Oral Once  . diphenhydrAMINE  25 mg Oral BID  . famotidine (PEPCID) IV  20 mg Intravenous Q12H  . fluticasone  2 spray Each Nare Daily  . furosemide  40 mg Oral Daily  . insulin aspart  0-5 Units Subcutaneous QHS  . insulin aspart  0-9 Units Subcutaneous TID WC  . insulin glargine  10 Units  Subcutaneous BH-q7a  . ipratropium  0.5 mg Nebulization BID  . levalbuterol  0.63 mg Nebulization BID  . methylPREDNISolone (SOLU-MEDROL) injection  80 mg Intravenous Q8H  . metoprolol  100 mg Oral BID  . nicotine  21 mg Transdermal Daily  . ramipril  10 mg Oral Daily  . sodium chloride flush  3 mL Intravenous Q12H   Continuous Infusions: . nitroGLYCERIN Stopped (09/26/15 1315)     LOS: 2 days    Time spent: 23 mins    Hailea Eaglin, MD [facility] Pager [phone]  If 7PM-7AM, please contact night-coverage [email or web address] Password [email or web address] 09/28/2015, 8:50 AM

## 2015-09-29 ENCOUNTER — Inpatient Hospital Stay (HOSPITAL_COMMUNITY): Payer: Medicare Other

## 2015-09-29 DIAGNOSIS — R0902 Hypoxemia: Secondary | ICD-10-CM

## 2015-09-29 DIAGNOSIS — J189 Pneumonia, unspecified organism: Secondary | ICD-10-CM | POA: Diagnosis not present

## 2015-09-29 LAB — CBC
HEMATOCRIT: 40.8 % (ref 39.0–52.0)
Hemoglobin: 13.5 g/dL (ref 13.0–17.0)
MCH: 29.4 pg (ref 26.0–34.0)
MCHC: 33.1 g/dL (ref 30.0–36.0)
MCV: 88.9 fL (ref 78.0–100.0)
Platelets: 182 10*3/uL (ref 150–400)
RBC: 4.59 MIL/uL (ref 4.22–5.81)
RDW: 14.2 % (ref 11.5–15.5)
WBC: 15.5 10*3/uL — AB (ref 4.0–10.5)

## 2015-09-29 LAB — BASIC METABOLIC PANEL
ANION GAP: 8 (ref 5–15)
BUN: 36 mg/dL — ABNORMAL HIGH (ref 6–20)
CALCIUM: 9 mg/dL (ref 8.9–10.3)
CO2: 28 mmol/L (ref 22–32)
Chloride: 104 mmol/L (ref 101–111)
Creatinine, Ser: 1.34 mg/dL — ABNORMAL HIGH (ref 0.61–1.24)
GFR calc Af Amer: 59 mL/min — ABNORMAL LOW (ref >60)
GFR calc non Af Amer: 51 mL/min — ABNORMAL LOW (ref >60)
GLUCOSE: 194 mg/dL — AB (ref 65–99)
POTASSIUM: 3.7 mmol/L (ref 3.5–5.1)
Sodium: 140 mmol/L (ref 135–145)

## 2015-09-29 LAB — GLUCOSE, CAPILLARY
GLUCOSE-CAPILLARY: 226 mg/dL — AB (ref 65–99)
GLUCOSE-CAPILLARY: 268 mg/dL — AB (ref 65–99)
GLUCOSE-CAPILLARY: 242 mg/dL — AB (ref 65–99)
Glucose-Capillary: 176 mg/dL — ABNORMAL HIGH (ref 65–99)
Glucose-Capillary: 209 mg/dL — ABNORMAL HIGH (ref 65–99)

## 2015-09-29 MED ORDER — PREDNISONE 50 MG PO TABS
60.0000 mg | ORAL_TABLET | Freq: Every day | ORAL | Status: DC
  Administered 2015-09-30 – 2015-10-01 (×2): 60 mg via ORAL
  Filled 2015-09-29 (×2): qty 1

## 2015-09-29 MED ORDER — DEXTROSE 5 % IV SOLN
1.0000 g | Freq: Two times a day (BID) | INTRAVENOUS | Status: DC
  Administered 2015-09-29 – 2015-10-02 (×6): 1 g via INTRAVENOUS
  Filled 2015-09-29 (×7): qty 1

## 2015-09-29 MED ORDER — GUAIFENESIN ER 600 MG PO TB12
1200.0000 mg | ORAL_TABLET | Freq: Two times a day (BID) | ORAL | Status: DC
  Administered 2015-09-29 – 2015-10-03 (×8): 1200 mg via ORAL
  Filled 2015-09-29 (×8): qty 2

## 2015-09-29 MED ORDER — VANCOMYCIN HCL IN DEXTROSE 1-5 GM/200ML-% IV SOLN
1000.0000 mg | Freq: Two times a day (BID) | INTRAVENOUS | Status: DC
  Administered 2015-09-29 – 2015-10-02 (×6): 1000 mg via INTRAVENOUS
  Filled 2015-09-29 (×7): qty 200

## 2015-09-29 NOTE — Progress Notes (Addendum)
PROGRESS NOTE    Jason Fisher  [id number] DOB: Jun 26, 1940 DOA: 09/25/2015 PCP: Nyoka Cowden, MD    Brief Narrative:   Jason Fisher is a 75 y.o. male with medical history significant of hypertension, hyperlipidemia, diabetes mellitus, CAD, MI, s/p of stent, Afib on Eliquis, AAA, CKD-III, who presents with a shortness of breath, chest pain and leg edema.  Patient reports that he has been having intermittent chest pain for about 2 month. The chest pain is located in the front chest, pressure-like, nonradiating, 5 out of 10 in severity. No tenderness over calf areas. Patient has cough with clear mucus production. In the past 2 days, he noted bilateral leg edema which is new for patient. No fever or chills. Patient denies nausea, vomiting, abdominal pain, symptoms of UTI, diarrhea, unilateral weakness.  ED Course: pt was found to have BNP 155.5, negative troponin, WBC 6.6, temperature normal, stable renal function. Chest x-ray showed vascular congestion consistent with CHF. Patient is placed on telemetry bed for observation.  Patient was admitted placed on IV diuretics for diuresis with some clinical improvement. Cardiology consultation was obtained and patient underwent cardiac catheterization on 09/26/2015 which showed a new 90% stenosis in the second OM status post stenting with bio Freet on stent. Patient developed an allergic reaction to the contrast dye with hives and bronchospasms was transferred to the stepdown unit placed on IV Solu-Medrol, IV Pepcid and Benadryl with clinical improvement.    Assessment & Plan:   Principal Problem:   Acute on chronic combined systolic and diastolic congestive heart failure (HCC) Active Problems:   Chest pain   Allergic reaction to contrast dye   HCAP (healthcare-associated pneumonia)   Hypoxia   DM (diabetes mellitus), type 2 with peripheral vascular complications (HCC)   Dyslipidemia   Essential hypertension   CAD S/P  percutaneous coronary angioplasty   Chronic atrial fibrillation ([place]): CHA2DS2Vasc = 5. On Warfarin. Rate control   Abdominal aortic aneurysm (HCC)   TOBACCO USER   SOB (shortness of breath)   CKD (chronic kidney disease), stage III   Progressive angina (HCC)   Diastolic congestive heart failure (HCC)   Paroxysmal atrial fibrillation (HCC)   Unstable angina (HCC)   Accelerated hypertension with diastolic congestive heart failure, NYHA class 3 (HCC)   Acute respiratory failure with hypoxia (HCC)  #1 acute on chronic combined systolic and diastolic heart failure Questionable etiology.Likely secondary to acute coronary syndrome as patient with complaints of chest pain on exertion and occasionally at rest. Patient also noted on admission to have some orthopnea with lower extremity edema and shortness of breath. Patient with clinical improvement. Strict I's and O have not been recorded accurately initially during the hospitalization. Patient is - 5.984 L during this hospitalization. Patient's weight today is 91.3 kg from 90.1 kg from 90 kg from 93.62 kg from 94.031 kg(09/25/2015) from 95.709 kg (09/24/2015). On 09/06/2015 patient's weight was 92.26 kg during the office visit. Patient with clinical improvement. Cardiac catheterization noted to have a new 90% stenosis and large second OM status post stenting with bioftreedom stent. Continue Lopressor, Lipitor, Cardizem, ramipril, Plavix, aspirin, Eliquis. Continue oral Lasix per cardiology. Cardiology following and appreciate input and recommendations.  #2 unstable angina/3 vessel coronary artery disease status post stenting to new 90% stenosis and large second OM 09/26/2015 Patient presented with chest pain ongoing for the past 2 months worse on exertion with associated shortness of breath and concern for angina. Patient with history of coronary artery disease with cath  in 2009 with DES placed to RCA for in-stent restenosis. Patient with clinical  improvement after diureses and stent placement. Patient has been seen in consultation by cardiology and patient s/p cardiac catheterization 09/26/2015 with new 90% stenosis in the large second OM status post stenting of second OM with a Biofreedom stent, chronic severe stenosis in the first diagonal, stents in proximal and mid RCA are patent, aneurysm or stenosis in the PL OM branch of the RCA-the aneurysm is new compared to 2009, mild LV dysfunction, elevated LVEDP.  2-D echo with EF of 55-60% with no wall motion abnormalities. Continue Lopressor, Lipitor, Cardizem, ramipril, eliquis, ASA, plavix, oral Lasix. Per cardiology patient is to continue dual antiplatelet therapy times one month and then discontinue aspirin. Cardiology following and appreciate input and recommendations.  #3 allergic reaction to contrast dye Patient noted to have an allergic reaction to contrast dye during cardiac catheterization on 09/26/2015 with hives and bronchospasms. Patient was placed on nebulizer treatments, IV steroids, IV Pepcid, Benadryl with clinical improvement. Patient currently 86 % on room air with no increased work of breathing. Continue Benadryl to 25 mg by mouth twice a day. Continue oral Pepcid 20 mg every 12 hours. Change IV Solu-Medrol to oral prednisone 60 mg daily. Continue scheduled nebulizers, Pulmicort and Brovana. Follow.  #4 hypoxia likely secondary to healthcare associated pneumonia Patient noted to be hypoxic with sats of 86% on room air. Chest x-ray done consistent with a lingular and left lower lobe pneumonia. Patient also noted to have a leukocytosis although also on steroids. Will check a sputum Gram stain and culture. Check a urine Legionella antigen. Check a urine pneumococcus antigen. Place on Mucinex, IV vancomycin and IV cefepime. Continue nebulizer treatments. Follow.  #5 hypertension Blood pressure 144/99. Continue Lopressor, ramipril, Cardizem. Follow.   #6 atrial fibrillation   CHA2DS2VASC  Score 5. Patient was rate controlled on Lopressor and Cardizem. Patient was on IV heparin precardiac catheterization. Eliquis has been resumed. Per cardiology.  #7 chronic kidney disease stage III Baseline creatinine 1.2-1.3. Creatinine trending back down and currently at 1.34 from 1.66 from 1.40 from 1.28. IV diuretics have been changed to oral diuretics. Monitor closely with diuretics. Follow.  #8 hyperlipidemia LDL was 91 08/30/2015 Continue Lipitor.  #9 diabetes mellitus type 2 Hemoglobin A1c 6.7 on 08/24/2015. CBGs 176-226. Elevated CBGs likely in part due to steroids. Hold oral hypoglycemic agents. Continue Lantus 10 units daily. Sliding scale insulin. Follow CBGs with steroid taper.  #10 tobacco abuse Tobacco cessation. Nicotine patch.     DVT prophylaxis: eliquis Code Status: Full Family Communication: Updated patient. No family at bedside. Disposition Plan: Remain on telemetry. Home when hypoxia has improved and on oral antibiotics.    Consultants:   Cardiology: Dr. Ellyn Hack 09/25/2015  Procedures:   Chest x-ray 09/24/2015, 09/29/2015  Cardiac catheterization 09/26/2015--- three-vessel obstructive coronary artery disease, chronic severe stenosis in the first diagonal, new 90% stenosis in a large second OM status post stenting with bio Freedom stent, stents in the proximal and mid RCA are patent, aneurysm and stenosis in the PL OM branch of the RCA-aneurysm is new compared to 2009, mild LV dysfunction, elevated LVEDP, allergic response to contrast with hives and bronchospasms.  2-D echo 09/27/2015  Antimicrobials:  IV vancomycin 09/29/2015  IV cefepime 09/29/2015   Subjective: Patient sitting up in bed. Patient noted to be hypoxic this morning. Patient denies any chest pain. Patient denies any shortness of breath. Asked nursing to ambulate patient and patient noted to be  hypoxic at rest of 86% and on ambulating.   Objective: Vitals:   09/28/15 2019  09/29/15 0015 09/29/15 0539 09/29/15 1225  BP:  136/72 121/65 (!) 144/99  Pulse: 84 88 74 (!) 102  Resp: 18  17 18   Temp:  97.9 F (36.6 C) 98 F (36.7 C) 98.4 F (36.9 C)  TempSrc:  Oral Oral Oral  SpO2:  94% 95% 90%  Weight:   91.3 kg (201 lb 4.8 oz)   Height:        Intake/Output Summary (Last 24 hours) at 09/29/15 1619 Last data filed at 09/29/15 1100  Gross per 24 hour  Intake              720 ml  Output             1250 ml  Net             -530 ml   Filed Weights   09/28/15 0354 09/28/15 1600 09/29/15 0539  Weight: 90.1 kg (198 lb 10.2 oz) 91.3 kg (201 lb 3.2 oz) 91.3 kg (201 lb 4.8 oz)    Examination:  General exam: Appears calm and comfortable.   Respiratory system: Some coarse breath sounds. No wheezing. Respiratory effort normal. Cardiovascular system: S1 & S2 heard, RRR. No JVD, murmurs, rubs, gallops or clicks.No lower extremity edema Gastrointestinal system: Abdomen is nondistended, soft and nontender. No organomegaly or masses felt. Normal bowel sounds heard. Central nervous system: Alert and oriented. No focal neurological deficits. Extremities: Symmetric 5 x 5 power. Skin: No rashes, lesions or ulcers Psychiatry: Judgement and insight appear normal. Mood & affect appropriate.     Data Reviewed: I have personally reviewed following labs and imaging studies  CBC:  Recent Labs Lab 09/24/15 2038 09/26/15 0227 09/27/15 0154 09/29/15 0212  WBC 6.6 7.2 9.4 15.5*  HGB 13.1 13.2 13.8 13.5  HCT 41.8 42.1 42.9 40.8  MCV 92.3 92.5 89.6 88.9  PLT 148* 143* 171 [id number]   Basic Metabolic Panel:  Recent Labs Lab 09/24/15 2038 09/25/15 0815 09/26/15 0227 09/27/15 0154 09/28/15 0248 09/29/15 0212  NA 140  --  142 140 141 140  K 3.6  --  4.0 3.0* 3.8 3.7  CL 108  --  106 99* 103 104  CO2 27  --  29 29 29 28   GLUCOSE 181*  --  102* 171* 190* 194*  BUN 22*  --  21* 20 35* 36*  CREATININE 1.25*  --  1.28* 1.40* 1.66* 1.34*  CALCIUM 9.3  --  9.2 9.2 9.3  9.0  MG  --  2.1 2.2 2.1  --   --    GFR: Estimated Creatinine Clearance: 53.1 mL/min (by C-G formula based on SCr of 1.34 mg/dL). Liver Function Tests: No results for input(s): AST, ALT, ALKPHOS, BILITOT, PROT, ALBUMIN in the last 168 hours. No results for input(s): LIPASE, AMYLASE in the last 168 hours. No results for input(s): AMMONIA in the last 168 hours. Coagulation Profile:  Recent Labs Lab 09/25/15 0221  INR 1.22   Cardiac Enzymes:  Recent Labs Lab 09/25/15 0221 09/25/15 0815 09/25/15 1246 09/26/15 1030 09/27/15 0154  TROPONINI <0.03 <0.03 <0.03 <0.03 0.06*   BNP (last 3 results) No results for input(s): PROBNP in the last 8760 hours. HbA1C: No results for input(s): HGBA1C in the last 72 hours. CBG:  Recent Labs Lab 09/28/15 1633 09/28/15 2212 09/29/15 0531 09/29/15 0820 09/29/15 1126  GLUCAP 304* 254* 176* 209* 226*   Lipid Profile:  No results for input(s): CHOL, HDL, LDLCALC, TRIG, CHOLHDL, LDLDIRECT in the last 72 hours. Thyroid Function Tests: No results for input(s): TSH, T4TOTAL, FREET4, T3FREE, THYROIDAB in the last 72 hours. Anemia Panel: No results for input(s): VITAMINB12, FOLATE, FERRITIN, TIBC, IRON, RETICCTPCT in the last 72 hours. Sepsis Labs: No results for input(s): PROCALCITON, LATICACIDVEN in the last 168 hours.  Recent Results (from the past 240 hour(s))  MRSA PCR Screening     Status: None   Collection Time: 09/26/15  1:30 PM  Result Value Ref Range Status   MRSA by PCR NEGATIVE NEGATIVE Final    Comment:        The GeneXpert MRSA Assay (FDA approved for NASAL specimens only), is one component of a comprehensive MRSA colonization surveillance program. It is not intended to diagnose MRSA infection nor to guide or monitor treatment for MRSA infections.          Radiology Studies: Dg Chest 2 View  Result Date: 09/29/2015 CLINICAL DATA:  Hypoxia.  Shortness of breath. EXAM: CHEST  2 VIEW COMPARISON:  09/26/2015  FINDINGS: Consolidation noted in the left lingula and left lower lobe compatible with pneumonia. No focal opacity on the right. Mild hyperinflation/COPD. Heart is borderline in size. No visible effusions. No acute bony abnormality. IMPRESSION: Hyperinflation/COPD. Lingular and left lower lobe pneumonia. Electronically Signed   By: Rolm Baptise M.D.   On: 09/29/2015 15:14        Scheduled Meds: . apixaban  5 mg Oral BID  . arformoterol  15 mcg Nebulization BID  . aspirin  81 mg Oral Daily  . atorvastatin  20 mg Oral q1800  . budesonide (PULMICORT) nebulizer solution  0.25 mg Nebulization BID  . ceFEPime (MAXIPIME) IV  1 g Intravenous Q12H  . clopidogrel  75 mg Oral Q breakfast  . clotrimazole  1 application Topical BID  . diltiazem  120 mg Oral Daily  . diltiazem  120 mg Oral Once  . diphenhydrAMINE  25 mg Oral BID  . famotidine  20 mg Oral BID  . fluticasone  2 spray Each Nare Daily  . furosemide  40 mg Oral Daily  . guaiFENesin  1,200 mg Oral BID  . insulin aspart  0-5 Units Subcutaneous QHS  . insulin aspart  0-9 Units Subcutaneous TID WC  . insulin glargine  10 Units Subcutaneous BH-q7a  . metoprolol  100 mg Oral BID  . nicotine  21 mg Transdermal Daily  . [START ON 09/30/2015] predniSONE  60 mg Oral QAC breakfast  . ramipril  10 mg Oral Daily  . sodium chloride flush  3 mL Intravenous Q12H   Continuous Infusions: . nitroGLYCERIN Stopped (09/26/15 1315)     LOS: 3 days    Time spent: 3 mins    THOMPSON,DANIEL, MD [facility] Pager [phone]  If 7PM-7AM, please contact night-coverage [email or web address] Password [facility] 09/29/2015, 4:19 PM

## 2015-09-29 NOTE — Progress Notes (Signed)
Patient refused both breathing treatments this morning stating that he never done them at home and when he started taking them here his breathing got worse, he wants to talk to MD before resuming breathing treatments.

## 2015-09-29 NOTE — Progress Notes (Signed)
SATURATION QUALIFICATIONS: (This note is used to comply with regulatory documentation for home oxygen)  Patient Saturations on Room Air at Rest = 86%  Patient Saturations on Room Air while Ambulating = 86%  Patient Saturations on 2 Liters of oxygen while Ambulating = 94%  Please briefly explain why patient needs home oxygen:

## 2015-09-29 NOTE — Progress Notes (Signed)
Patient Name: Jason Fisher Date of Encounter: 09/29/2015  Hospital Problem List     Principal Problem:   Acute on chronic combined systolic and diastolic congestive heart failure (HCC) Active Problems:   DM (diabetes mellitus), type 2 with peripheral vascular complications (HCC)   Dyslipidemia   Essential hypertension   CAD S/P percutaneous coronary angioplasty   Chronic atrial fibrillation ([place]): CHA2DS2Vasc = 5. On Warfarin. Rate control   Abdominal aortic aneurysm (HCC)   TOBACCO USER   Chest pain   SOB (shortness of breath)   CKD (chronic kidney disease), stage III   Progressive angina (HCC)   Diastolic congestive heart failure (HCC)   Paroxysmal atrial fibrillation (HCC)   Unstable angina (HCC)   Allergic reaction to contrast dye   Accelerated hypertension with diastolic congestive heart failure, NYHA class 3 (HCC)   Acute respiratory failure with hypoxia [facility])    Patient Profile   75 year old with CAD, atrial fib admitted with chest pain, SOB and swelling.  He was admitted for IV diuresis.  He had cardiac catheterization on 09/26/2015 which showed a new 90% stenosis in the second OM status post stenting with bio Freet on stent. Patient developed an allergic reaction to the contrast dye with hives and bronchospasms was transferred to the stepdown unit placed on IV Solu-Medrol, IV Pepcid and Benadryl with clinical improvement.   Subjective   Feels well.  Denies chest pain or SOB.   Inpatient Medications    . apixaban  5 mg Oral BID  . arformoterol  15 mcg Nebulization BID  . aspirin  81 mg Oral Daily  . atorvastatin  20 mg Oral q1800  . budesonide (PULMICORT) nebulizer solution  0.25 mg Nebulization BID  . clopidogrel  75 mg Oral Q breakfast  . clotrimazole  1 application Topical BID  . diltiazem  120 mg Oral Daily  . diltiazem  120 mg Oral Once  . diphenhydrAMINE  25 mg Oral BID  . famotidine  20 mg Oral BID  . fluticasone  2 spray Each Nare Daily  .  furosemide  40 mg Oral Daily  . insulin aspart  0-5 Units Subcutaneous QHS  . insulin aspart  0-9 Units Subcutaneous TID WC  . insulin glargine  10 Units Subcutaneous BH-q7a  . methylPREDNISolone (SOLU-MEDROL) injection  80 mg Intravenous Q12H  . metoprolol  100 mg Oral BID  . nicotine  21 mg Transdermal Daily  . ramipril  10 mg Oral Daily  . sodium chloride flush  3 mL Intravenous Q12H    Vital Signs    Vitals:   09/28/15 1958 09/28/15 2019 09/29/15 0015 09/29/15 0539  BP: 138/75  136/72 121/65  Pulse: 75 84 88 74  Resp: 18 18  17   Temp: 98 F (36.7 C)  97.9 F (36.6 C) 98 F (36.7 C)  TempSrc: Oral  Oral Oral  SpO2: 93%  94% 95%  Weight:    201 lb 4.8 oz (91.3 kg)  Height:        Intake/Output Summary (Last 24 hours) at 09/29/15 1102 Last data filed at 09/29/15 0753  Gross per 24 hour  Intake              720 ml  Output             1500 ml  Net             -780 ml   Filed Weights   09/28/15 0354 09/28/15 1600 09/29/15  0539  Weight: 198 lb 10.2 oz (90.1 kg) 201 lb 3.2 oz (91.3 kg) 201 lb 4.8 oz (91.3 kg)    Physical Exam    GEN: Well nourished, well developed, in no acute distress.  HEENT: normal.  Neck: Supple, no JVD, carotid bruits, or masses. Cardiac: RRR, no murmurs, rubs, or gallops. No clubbing, cyanosis, edema.  Radials/DP/PT 2+ and equal bilaterally.  Respiratory:  Respirations regular and unlabored, clear to auscultation bilaterally. GI: Soft, nontender, nondistended, BS + x 4. MS: no deformity or atrophy. Skin: warm and dry, no rash. Neuro:  Strength and sensation are intact. Psych: Normal affect.  Labs    CBC  Recent Labs  09/27/15 0154 09/29/15 0212  WBC 9.4 15.5*  HGB 13.8 13.5  HCT 42.9 40.8  MCV 89.6 88.9  PLT 171 Q000111Q   Basic Metabolic Panel  Recent Labs  09/27/15 0154 09/28/15 0248 09/29/15 0212  NA 140 141 140  K 3.0* 3.8 3.7  CL 99* 103 104  CO2 29 29 28   GLUCOSE 171* 190* 194*  BUN 20 35* 36*  CREATININE 1.40* 1.66*  1.34*  CALCIUM 9.2 9.3 9.0  MG 2.1  --   --    Liver Function Tests No results for input(s): AST, ALT, ALKPHOS, BILITOT, PROT, ALBUMIN in the last 72 hours. No results for input(s): LIPASE, AMYLASE in the last 72 hours. Cardiac Enzymes  Recent Labs  09/27/15 0154  TROPONINI 0.06*   BNP Invalid input(s): POCBNP D-Dimer No results for input(s): DDIMER in the last 72 hours. Hemoglobin A1C No results for input(s): HGBA1C in the last 72 hours. Fasting Lipid Panel No results for input(s): CHOL, HDL, LDLCALC, TRIG, CHOLHDL, LDLDIRECT in the last 72 hours. Thyroid Function Tests No results for input(s): TSH, T4TOTAL, T3FREE, THYROIDAB in the last 72 hours.  Invalid input(s): FREET3  Telemetry    Atrial fib  ECG    None today  Radiology    Dg Chest 2 View  Result Date: 09/24/2015 CLINICAL DATA:  Shortness of breath. Chest pressure. Bilateral leg swelling for 1 week. Patient reports chronic cough. EXAM: CHEST  2 VIEW COMPARISON:  Chest radiograph 04/09/2005 FINDINGS: Heart at the upper limits normal in size. There is tortuosity and atherosclerosis of the thoracic aorta. The lungs are hyperinflated with small bilateral pleural effusions, left greater than right. Associated bibasilar opacities are likely atelectasis. Minimal vascular congestion without alveolar edema. No focal consolidation. No pneumothorax. No acute osseous abnormalities are seen. There is degenerative change in the spine. IMPRESSION: 1. Borderline cardiomegaly with small pleural effusions and mild vascular congestion, suggesting mild fluid overload/CHF. 2. Hyperinflation suggests emphysema. Electronically Signed   By: Jeb Levering M.D.   On: 09/24/2015 21:24   Dg Chest Port 1 View  Result Date: 09/26/2015 CLINICAL DATA:  Post heart catheterization.  Cough. EXAM: PORTABLE CHEST 1 VIEW COMPARISON:  September 24, 2015 FINDINGS: Mild cardiomegaly. The hila and mediastinum are unremarkable. No pulmonary nodules, masses,  or focal infiltrates. No overt pulmonary edema. IMPRESSION: No active disease. Electronically Signed   By: Dorise Bullion III M.D   On: 09/26/2015 15:07    Assessment & Plan    CAD:  Stent to OM.  Continue DAPT for one month then DC ASA. Resume Eliquis in am.   Would be OK to discharge from a cardiac standpoint.   HTN:  BP OK.  Continue current therapy.    ATRIAL FIB:  Rate control and anticoagulation.  On Eliquis.    CKD STAGE  III:    Creat is improved today.  Continue current therapy.   Signed, Minus Breeding, MD  09/29/2015, 11:02 AM

## 2015-09-29 NOTE — Progress Notes (Signed)
Pharmacy Antibiotic Note  Jason Fisher is a 75 y.o. male admitted on 09/25/2015 with pneumonia.  Pharmacy has been consulted for Vancomycin / Cefepime  dosing.  Plan: Cefepime 1 gram iv Q 12 hours Vancomycin 750 mg iv Q 12 hours Follow up Scr, progress, cultures  Height: 5\' 8"  (172.7 cm) Weight: 201 lb 4.8 oz (91.3 kg) (a scale) IBW/kg (Calculated) : 68.4  Temp (24hrs), Avg:98.1 F (36.7 C), Min:97.9 F (36.6 C), Max:98.4 F (36.9 C)   Recent Labs Lab 09/24/15 2038 09/26/15 0227 09/27/15 0154 09/28/15 0248 09/29/15 0212  WBC 6.6 7.2 9.4  --  15.5*  CREATININE 1.25* 1.28* 1.40* 1.66* 1.34*    Estimated Creatinine Clearance: 53.1 mL/min (by C-G formula based on SCr of 1.34 mg/dL).    Allergies  Allergen Reactions  . Contrast Media [Iodinated Diagnostic Agents] Anaphylaxis     Thank you for allowing pharmacy to be a part of this patient's care. Anette Guarneri, PharmD [phone] 09/29/2015 4:20 PM

## 2015-09-30 ENCOUNTER — Encounter: Payer: Medicare Other | Admitting: Physician Assistant

## 2015-09-30 LAB — BASIC METABOLIC PANEL
ANION GAP: 8 (ref 5–15)
BUN: 30 mg/dL — ABNORMAL HIGH (ref 6–20)
CALCIUM: 8.7 mg/dL — AB (ref 8.9–10.3)
CO2: 29 mmol/L (ref 22–32)
CREATININE: 1.13 mg/dL (ref 0.61–1.24)
Chloride: 103 mmol/L (ref 101–111)
Glucose, Bld: 181 mg/dL — ABNORMAL HIGH (ref 65–99)
Potassium: 3.6 mmol/L (ref 3.5–5.1)
SODIUM: 140 mmol/L (ref 135–145)

## 2015-09-30 LAB — GLUCOSE, CAPILLARY
GLUCOSE-CAPILLARY: 234 mg/dL — AB (ref 65–99)
GLUCOSE-CAPILLARY: 242 mg/dL — AB (ref 65–99)
Glucose-Capillary: 260 mg/dL — ABNORMAL HIGH (ref 65–99)
Glucose-Capillary: 271 mg/dL — ABNORMAL HIGH (ref 65–99)

## 2015-09-30 LAB — CBC
HCT: 41.7 % (ref 39.0–52.0)
Hemoglobin: 13.5 g/dL (ref 13.0–17.0)
MCH: 29.2 pg (ref 26.0–34.0)
MCHC: 32.4 g/dL (ref 30.0–36.0)
MCV: 90.3 fL (ref 78.0–100.0)
PLATELETS: 146 10*3/uL — AB (ref 150–400)
RBC: 4.62 MIL/uL (ref 4.22–5.81)
RDW: 14.2 % (ref 11.5–15.5)
WBC: 9.4 10*3/uL (ref 4.0–10.5)

## 2015-09-30 LAB — STREP PNEUMONIAE URINARY ANTIGEN: STREP PNEUMO URINARY ANTIGEN: NEGATIVE

## 2015-09-30 LAB — HIV ANTIBODY (ROUTINE TESTING W REFLEX): HIV SCREEN 4TH GENERATION: NONREACTIVE

## 2015-09-30 MED ORDER — FAMOTIDINE 20 MG PO TABS
20.0000 mg | ORAL_TABLET | Freq: Every day | ORAL | Status: DC
  Administered 2015-10-01 – 2015-10-03 (×3): 20 mg via ORAL
  Filled 2015-09-30 (×3): qty 1

## 2015-09-30 MED ORDER — INSULIN GLARGINE 100 UNIT/ML ~~LOC~~ SOLN
15.0000 [IU] | SUBCUTANEOUS | Status: DC
  Administered 2015-09-30: 15 [IU] via SUBCUTANEOUS
  Filled 2015-09-30 (×2): qty 0.15

## 2015-09-30 MED ORDER — INSULIN GLARGINE 100 UNIT/ML ~~LOC~~ SOLN
20.0000 [IU] | SUBCUTANEOUS | Status: DC
  Administered 2015-10-01 – 2015-10-02 (×2): 20 [IU] via SUBCUTANEOUS
  Filled 2015-09-30 (×3): qty 0.2

## 2015-09-30 MED ORDER — DIPHENHYDRAMINE HCL 25 MG PO CAPS
25.0000 mg | ORAL_CAPSULE | Freq: Every day | ORAL | Status: DC
  Administered 2015-10-01 – 2015-10-03 (×3): 25 mg via ORAL
  Filled 2015-09-30 (×3): qty 1

## 2015-09-30 MED FILL — Nitroglycerin IV Soln 100 MCG/ML in D5W: INTRA_ARTERIAL | Qty: 10 | Status: AC

## 2015-09-30 NOTE — Progress Notes (Signed)
PROGRESS NOTE    Jason Fisher  [id number] DOB: [birth year] DOA: 09/25/2015 PCP: Nyoka Cowden, MD    Brief Narrative:   Jason Fisher is a 75 y.o. male with medical history significant of hypertension, hyperlipidemia, diabetes mellitus, CAD, MI, s/p of stent, Afib on Eliquis, AAA, CKD-III, who presents with a shortness of breath, chest pain and leg edema.  Patient reports that he has been having intermittent chest pain for about 2 month. The chest pain is located in the front chest, pressure-like, nonradiating, 5 out of 10 in severity. No tenderness over calf areas. Patient has cough with clear mucus production. In the past 2 days, he noted bilateral leg edema which is new for patient. No fever or chills. Patient denies nausea, vomiting, abdominal pain, symptoms of UTI, diarrhea, unilateral weakness.  ED Course: pt was found to have BNP 155.5, negative troponin, WBC 6.6, temperature normal, stable renal function. Chest x-ray showed vascular congestion consistent with CHF. Patient is placed on telemetry bed for observation.  Patient was admitted placed on IV diuretics for diuresis with some clinical improvement. Cardiology consultation was obtained and patient underwent cardiac catheterization on 09/26/2015 which showed a new 90% stenosis in the second OM status post stenting with bio Freet on stent. Patient developed an allergic reaction to the contrast dye with hives and bronchospasms was transferred to the stepdown unit placed on IV Solu-Medrol, IV Pepcid and Benadryl with clinical improvement.    Assessment & Plan:   Principal Problem:   Acute on chronic combined systolic and diastolic congestive heart failure (HCC) Active Problems:   Chest pain   Allergic reaction to contrast dye   HCAP (healthcare-associated pneumonia)   Hypoxia   DM (diabetes mellitus), type 2 with peripheral vascular complications (HCC)   Dyslipidemia   Essential hypertension   CAD S/P  percutaneous coronary angioplasty   Chronic atrial fibrillation ([place]): CHA2DS2Vasc = 5. On Warfarin. Rate control   Abdominal aortic aneurysm (HCC)   TOBACCO USER   SOB (shortness of breath)   CKD (chronic kidney disease), stage III   Progressive angina (HCC)   Diastolic congestive heart failure (HCC)   Paroxysmal atrial fibrillation (HCC)   Unstable angina (HCC)   Accelerated hypertension with diastolic congestive heart failure, NYHA class 3 (HCC)   Acute respiratory failure with hypoxia (HCC)  #1 acute on chronic combined systolic and diastolic heart failure Questionable etiology.Likely secondary to acute coronary syndrome as patient with complaints of chest pain on exertion and occasionally at rest. Patient also noted on admission to have some orthopnea with lower extremity edema and shortness of breath. Patient with clinical improvement. Strict I's and O have not been recorded accurately initially during the hospitalization. Patient is - 6.339 L during this hospitalization. Patient's weight today is 90 kg from 91.3 kg from 90.1 kg from 90 kg from 93.62 kg from 94.031 kg(09/25/2015) from 95.709 kg (09/24/2015). On 09/06/2015 patient's weight was 92.26 kg during the office visit. Patient with clinical improvement. Cardiac catheterization noted to have a new 90% stenosis and large second OM status post stenting with bioftreedom stent. Continue Lopressor, Lipitor, Cardizem, ramipril, Plavix, aspirin, Eliquis. Continue oral Lasix per cardiology. Will discontinue Actos on discharge. Cardiology following and appreciate input and recommendations.  #2 unstable angina/3 vessel coronary artery disease status post stenting to new 90% stenosis and large second OM 09/26/2015 Patient presented with chest pain ongoing for the past 2 months worse on exertion with associated shortness of breath and concern for angina. Patient  with history of coronary artery disease with cath in 2009 with DES placed to RCA for  in-stent restenosis. Patient with clinical improvement after diureses and stent placement. Patient has been seen in consultation by cardiology and patient s/p cardiac catheterization 09/26/2015 with new 90% stenosis in the large second OM status post stenting of second OM with a Biofreedom stent, chronic severe stenosis in the first diagonal, stents in proximal and mid RCA are patent, aneurysm or stenosis in the PL OM branch of the RCA-the aneurysm is new compared to 2009, mild LV dysfunction, elevated LVEDP.  2-D echo with EF of 55-60% with no wall motion abnormalities. Continue Lopressor, Lipitor, Cardizem, ramipril, eliquis, ASA, plavix, oral Lasix. Per cardiology patient is to continue dual antiplatelet therapy times one month and then discontinue aspirin. Cardiology following and appreciate input and recommendations.  #3 allergic reaction to contrast dye Patient noted to have an allergic reaction to contrast dye during cardiac catheterization on 09/26/2015 with hives and bronchospasms. Patient was placed on nebulizer treatments, IV steroids, IV Pepcid, Benadryl with clinical improvement. Patient currently 86 % on room air with no increased work of breathing. Change Benadryl to 25 mg daily. Change oral Pepcid 20 mg daily. Changed IV Solu-Medrol to oral prednisone 60 mg daily. Continue scheduled nebulizers, Pulmicort and Brovana. Follow.  #4 hypoxia likely secondary to healthcare associated pneumonia Patient noted to be hypoxic with sats of 86% on room air on 09/29/2015. Chest x-ray done consistent with a lingular and left lower lobe pneumonia. Patient also noted to have a leukocytosis although also on steroids. Leukocytosis is trending down. Clinical improvement. Patient with sats of 93% on room air. Sputum Gram stain and cultures pending. Urine Legionella antigen pending. Urine pneumococcus antigen pending. Continue Mucinex, IV vancomycin, IV cefepime, nebulizer treatments. Blood cultures remain negative  tomorrow will discontinue IV vancomycin.   #5 hypertension Blood pressure 144/99. Continue Lopressor, ramipril, Cardizem. Follow.   #6 atrial fibrillation  CHA2DS2VASC  Score 5. Patient was rate controlled on Lopressor and Cardizem. Patient was on IV heparin precardiac catheterization. Eliquis has been resumed. Per cardiology.  #7 chronic kidney disease stage III Baseline creatinine 1.2-1.3. Creatinine trending back down and currently at 1.13 from 1.34 from 1.66 from 1.40 from 1.28. IV diuretics have been changed to oral diuretics. Monitor closely with diuretics. Follow.  #8 hyperlipidemia LDL was 91 08/30/2015 Continue Lipitor.  #9 diabetes mellitus type 2 Hemoglobin A1c 6.7 on 08/24/2015. CBGs [id number]. Elevated CBGs likely in part due to steroids. Hold oral hypoglycemic agents. Increased Lantus 15 units daily. Sliding scale insulin. Follow CBGs with steroid taper. On discharge will discontinue Actos secondary to CHF. Outpatient follow-up.  #10 tobacco abuse Tobacco cessation. Nicotine patch.     DVT prophylaxis: eliquis Code Status: Full Family Communication: Updated patient. No family at bedside. Disposition Plan: Remain on telemetry. Home when hypoxia has improved and on oral antibiotics, hopefully 24-48 hours.    Consultants:   Cardiology: Dr. Ellyn Hack 09/25/2015  Procedures:   Chest x-ray 09/24/2015, 09/29/2015  Cardiac catheterization 09/26/2015--- three-vessel obstructive coronary artery disease, chronic severe stenosis in the first diagonal, new 90% stenosis in a large second OM status post stenting with bio Freedom stent, stents in the proximal and mid RCA are patent, aneurysm and stenosis in the PL OM branch of the RCA-aneurysm is new compared to 2009, mild LV dysfunction, elevated LVEDP, allergic response to contrast with hives and bronchospasms.  2-D echo 09/27/2015  Antimicrobials:  IV vancomycin 09/29/2015  IV cefepime 09/29/2015  Subjective: Patient  sitting up in chair. Patient denies any chest pain. Patient denies any shortness of breath. Patient states feeling better this morning. Patient with sats of 93% on room air, after being started on antibiotics for pneumonia.   Objective: Vitals:   09/29/15 1225 09/29/15 2023 09/30/15 0550 09/30/15 1147  BP: (!) 144/99 (!) 166/71 (!) 151/77 (!) 134/100  Pulse: (!) 102 98 90 86  Resp: 18 20 20 20   Temp: 98.4 F (36.9 C) 98 F (36.7 C) 97.7 F (36.5 C) 97.5 F (36.4 C)  TempSrc: Oral Oral Oral Oral  SpO2: 90% 94% 93% 93%  Weight:   90 kg (198 lb 6.4 oz)   Height:        Intake/Output Summary (Last 24 hours) at 09/30/15 1215 Last data filed at 09/30/15 1039  Gross per 24 hour  Intake              680 ml  Output             1275 ml  Net             -595 ml   Filed Weights   09/28/15 1600 09/29/15 0539 09/30/15 0550  Weight: 91.3 kg (201 lb 3.2 oz) 91.3 kg (201 lb 4.8 oz) 90 kg (198 lb 6.4 oz)    Examination:  General exam: Appears calm and comfortable.   Respiratory system: Some coarse breath sounds L > R. No wheezing. Respiratory effort normal. Cardiovascular system: S1 & S2 heard, RRR. No JVD, murmurs, rubs, gallops or clicks.No lower extremity edema Gastrointestinal system: Abdomen is nondistended, soft and nontender. No organomegaly or masses felt. Normal bowel sounds heard. Central nervous system: Alert and oriented. No focal neurological deficits. Extremities: Symmetric 5 x 5 power. Skin: No rashes, lesions or ulcers Psychiatry: Judgement and insight appear normal. Mood & affect appropriate.     Data Reviewed: I have personally reviewed following labs and imaging studies  CBC:  Recent Labs Lab 09/24/15 2038 09/26/15 0227 09/27/15 0154 09/29/15 0212 09/30/15 0516  WBC 6.6 7.2 9.4 15.5* 9.4  HGB 13.1 13.2 13.8 13.5 13.5  HCT 41.8 42.1 42.9 40.8 41.7  MCV 92.3 92.5 89.6 88.9 90.3  PLT 148* 143* 171 182 [id number]*   Basic Metabolic Panel:  Recent Labs Lab  09/25/15 0815 09/26/15 0227 09/27/15 0154 09/28/15 0248 09/29/15 0212 09/30/15 0516  NA  --  142 140 141 140 140  K  --  4.0 3.0* 3.8 3.7 3.6  CL  --  106 99* 103 104 103  CO2  --  29 29 29 28 29   GLUCOSE  --  102* 171* 190* 194* 181*  BUN  --  21* 20 35* 36* 30*  CREATININE  --  1.28* 1.40* 1.66* 1.34* 1.13  CALCIUM  --  9.2 9.2 9.3 9.0 8.7*  MG 2.1 2.2 2.1  --   --   --    GFR: Estimated Creatinine Clearance: 62.5 mL/min (by C-G formula based on SCr of 1.13 mg/dL). Liver Function Tests: No results for input(s): AST, ALT, ALKPHOS, BILITOT, PROT, ALBUMIN in the last 168 hours. No results for input(s): LIPASE, AMYLASE in the last 168 hours. No results for input(s): AMMONIA in the last 168 hours. Coagulation Profile:  Recent Labs Lab 09/25/15 0221  INR 1.22   Cardiac Enzymes:  Recent Labs Lab 09/25/15 0221 09/25/15 0815 09/25/15 1246 09/26/15 1030 09/27/15 0154  TROPONINI <0.03 <0.03 <0.03 <0.03 0.06*   BNP (last 3 results) No  results for input(s): PROBNP in the last 8760 hours. HbA1C: No results for input(s): HGBA1C in the last 72 hours. CBG:  Recent Labs Lab 09/29/15 1126 09/29/15 1630 09/29/15 2108 09/30/15 0629 09/30/15 1145  GLUCAP 226* 268* 242* 234* 242*   Lipid Profile: No results for input(s): CHOL, HDL, LDLCALC, TRIG, CHOLHDL, LDLDIRECT in the last 72 hours. Thyroid Function Tests: No results for input(s): TSH, T4TOTAL, FREET4, T3FREE, THYROIDAB in the last 72 hours. Anemia Panel: No results for input(s): VITAMINB12, FOLATE, FERRITIN, TIBC, IRON, RETICCTPCT in the last 72 hours. Sepsis Labs: No results for input(s): PROCALCITON, LATICACIDVEN in the last 168 hours.  Recent Results (from the past 240 hour(s))  MRSA PCR Screening     Status: None   Collection Time: 09/26/15  1:30 PM  Result Value Ref Range Status   MRSA by PCR NEGATIVE NEGATIVE Final    Comment:        The GeneXpert MRSA Assay (FDA approved for NASAL specimens only), is  one component of a comprehensive MRSA colonization surveillance program. It is not intended to diagnose MRSA infection nor to guide or monitor treatment for MRSA infections.          Radiology Studies: Dg Chest 2 View  Result Date: 09/29/2015 CLINICAL DATA:  Hypoxia.  Shortness of breath. EXAM: CHEST  2 VIEW COMPARISON:  09/26/2015 FINDINGS: Consolidation noted in the left lingula and left lower lobe compatible with pneumonia. No focal opacity on the right. Mild hyperinflation/COPD. Heart is borderline in size. No visible effusions. No acute bony abnormality. IMPRESSION: Hyperinflation/COPD. Lingular and left lower lobe pneumonia. Electronically Signed   By: Rolm Baptise M.D.   On: 09/29/2015 15:14        Scheduled Meds: . apixaban  5 mg Oral BID  . arformoterol  15 mcg Nebulization BID  . aspirin  81 mg Oral Daily  . atorvastatin  20 mg Oral q1800  . budesonide (PULMICORT) nebulizer solution  0.25 mg Nebulization BID  . ceFEPime (MAXIPIME) IV  1 g Intravenous Q12H  . clopidogrel  75 mg Oral Q breakfast  . clotrimazole  1 application Topical BID  . diltiazem  120 mg Oral Daily  . diltiazem  120 mg Oral Once  . diphenhydrAMINE  25 mg Oral BID  . famotidine  20 mg Oral BID  . fluticasone  2 spray Each Nare Daily  . furosemide  40 mg Oral Daily  . guaiFENesin  1,200 mg Oral BID  . insulin aspart  0-5 Units Subcutaneous QHS  . insulin aspart  0-9 Units Subcutaneous TID WC  . insulin glargine  15 Units Subcutaneous BH-q7a  . metoprolol  100 mg Oral BID  . nicotine  21 mg Transdermal Daily  . predniSONE  60 mg Oral QAC breakfast  . ramipril  10 mg Oral Daily  . sodium chloride flush  3 mL Intravenous Q12H  . vancomycin  1,000 mg Intravenous Q12H   Continuous Infusions: . nitroGLYCERIN Stopped (09/26/15 1315)     LOS: 4 days    Time spent: 35 mins    Aleeta Schmaltz, MD [facility] Pager [phone]  If 7PM-7AM, please contact  night-coverage [email or web address] Password [facility] 09/30/2015, 12:15 PM

## 2015-09-30 NOTE — Progress Notes (Addendum)
Inpatient Diabetes Program Recommendations  AACE/ADA: New Consensus Statement on Inpatient Glycemic Control (2015)  Target Ranges:  Prepandial:   less than 140 mg/dL      Peak postprandial:   less than 180 mg/dL (1-2 hours)      Critically ill patients:  140 - 180 mg/dL   Results for Jason Fisher, Jason Fisher (MRN [id number]) as of 09/30/2015 10:02  Ref. Range 09/29/2015 08:20 09/29/2015 11:26 09/29/2015 16:30 09/29/2015 21:08  Glucose-Capillary Latest Ref Range: 65 - 99 mg/dL 209 (H) 226 (H) 268 (H) 242 (H)   Results for Jason Fisher, Jason Fisher (MRN [id number]) as of 09/30/2015 10:02  Ref. Range 09/30/2015 06:29  Glucose-Capillary Latest Ref Range: 65 - 99 mg/dL 234 (H)   Results for Jason Fisher, Jason Fisher (MRN [id number]) as of 09/30/2015 10:02  Ref. Range 09/25/2015 02:22  Hemoglobin A1C Latest Ref Range: 4.8 - 5.6 % 6.7 (H)    Home DM Meds: Actos 45 mg daily, Victoza 1.8 mg daily, Metformin 1000 mg BID, Amaryl 4 mg QAM  Current Insulin Orders: Lantus 15 units daily      Novolog Sensitive Correction Scale/ SSI (0-9 units) TID AC + HS     -Note last dose IV Solumedrol given yesterday at 4am.  -Patient now getting Prednisone 60 mg daily.  Prednisone will likely cause postprandial glucose elevations.  -Patient eating 100% of meals per documentation.    MD- Note Lantus increased to 15 units daily.  Please consider the following in-hospital insulin adjustments while home DM meds are on hold and while patient receiving steroids:  1. Start Novolog Meal Coverage: Novolog 3 units tidwc (hold if pt eats <50% of meal)  2. Please note that patient takes Actos as an outpatient which can worsen CHF due to side effect of fluid retention.  Please consider discontinuing Actos at time of discharge.     --Will follow patient during hospitalization--  Wyn Quaker RN, MSN, CDE Diabetes Coordinator Inpatient Glycemic Control Team Team Pager: [phone] (8a-5p)

## 2015-09-30 NOTE — Progress Notes (Signed)
Patient Name: Jason Fisher Date of Encounter: 09/30/2015  Primary Cardiologist: Dr. Martinique Patient Profile: 75 year old with CAD, atrial fib admitted with chest pain, SOB and swelling.  He was admitted for IV diuresis.  He had cardiac catheterization on 09/26/2015 which showed a new 90% stenosis in the second OM status post stenting with bio Freet on stent. Patient developed an allergic reaction to the contrast dye with hives and bronchospasms was transferred to the stepdown unit placed on IV Solu-Medrol, IV Pepcid and Benadryl with clinical improvement.  SUBJECTIVE: Feels well, denies chest pain and SOB.   OBJECTIVE Vitals:   09/29/15 0539 09/29/15 1225 09/29/15 2023 09/30/15 0550  BP: 121/65 (!) 144/99 (!) 166/71 (!) 151/77  Pulse: 74 (!) 102 98 90  Resp: 17 18 20 20   Temp: 98 F (36.7 C) 98.4 F (36.9 C) 98 F (36.7 C) 97.7 F (36.5 C)  TempSrc: Oral Oral Oral Oral  SpO2: 95% 90% 94% 93%  Weight: 201 lb 4.8 oz (91.3 kg)   198 lb 6.4 oz (90 kg)  Height:        Intake/Output Summary (Last 24 hours) at 09/30/15 0923 Last data filed at 09/30/15 0849  Gross per 24 hour  Intake              720 ml  Output             1475 ml  Net             -755 ml   Filed Weights   09/28/15 1600 09/29/15 0539 09/30/15 0550  Weight: 201 lb 3.2 oz (91.3 kg) 201 lb 4.8 oz (91.3 kg) 198 lb 6.4 oz (90 kg)    PHYSICAL EXAM General: Well developed, well nourished, male in no acute distress. Head: Normocephalic, atraumatic. Still with some mild periorbital edema.   Neck: Supple without bruits, no JVD. Lungs:  Resp regular and unlabored, rhonchi in upper lobes Heart: RRR, S1, S2, no S3, S4, or murmur; no rub. Abdomen: Soft, non-tender, non-distended, BS + x 4.  Extremities: No clubbing, cyanosis, generalized edema.  Neuro: Alert and oriented X 3. Moves all extremities spontaneously. Psych: Normal affect.  LABS: CBC: Recent Labs  09/29/15 0212 09/30/15 0516  WBC 15.5* 9.4  HGB  13.5 13.5  HCT 40.8 41.7  MCV 88.9 90.3  PLT 182 [id number]*  Basic Metabolic Panel: Recent Labs  09/29/15 0212 09/30/15 0516  NA 140 140  K 3.7 3.6  CL 104 103  CO2 28 29  GLUCOSE 194* 181*  BUN 36* 30*  CREATININE 1.34* 1.13  CALCIUM 9.0 8.7*   BNP:  B Natriuretic Peptide  Date/Time Value Ref Range Status  09/24/2015 08:38 PM 155.5 (H) 0.0 - 100.0 pg/mL Final     Current Facility-Administered Medications:  .  0.9 %  sodium chloride infusion, 250 mL, Intravenous, PRN, Peter M Martinique, MD, Stopped at 09/27/15 0300 .  acetaminophen (TYLENOL) tablet 650 mg, 650 mg, Oral, Q4H PRN, Ivor Costa, MD .  apixaban Arne [place]) tablet 5 mg, 5 mg, Oral, BID, Peter M Martinique, MD, 5 mg at 09/29/15 2100 .  arformoterol (BROVANA) nebulizer solution 15 mcg, 15 mcg, Nebulization, BID, Eugenie Filler, MD, 15 mcg at 09/28/15 2018 .  aspirin chewable tablet 81 mg, 81 mg, Oral, Daily, Honor Loh, RPH, 81 mg at 09/29/15 0931 .  atorvastatin (LIPITOR) tablet 20 mg, 20 mg, Oral, q1800, Ivor Costa, MD, 20 mg at 09/29/15 1731 .  budesonide (PULMICORT) nebulizer solution 0.25 mg, 0.25 mg, Nebulization, BID, Eugenie Filler, MD, 0.25 mg at 09/28/15 2018 .  ceFEPIme (MAXIPIME) 1 g in dextrose 5 % 50 mL IVPB, 1 g, Intravenous, Q12H, Eugenie Filler, MD, 1 g at 09/30/15 0620 .  clopidogrel (PLAVIX) tablet 75 mg, 75 mg, Oral, Q breakfast, Peter M Martinique, MD, 75 mg at 09/30/15 0840 .  clotrimazole (LOTRIMIN) 1 % cream 1 application, 1 application, Topical, BID, Ivor Costa, MD, 1 application at [id number] .  diltiazem (CARDIZEM CD) 24 hr capsule 120 mg, 120 mg, Oral, Daily, Ivor Costa, MD, 120 mg at 09/29/15 0931 .  diltiazem (CARDIZEM CD) 24 hr capsule 120 mg, 120 mg, Oral, Once, Honor Loh, [facility] .  diphenhydrAMINE (BENADRYL) 25 mg in sodium chloride 0.9 % 50 mL IVPB, 25 mg, Intravenous, Q6H PRN, Peter M Martinique, MD .  diphenhydrAMINE (BENADRYL) capsule 25 mg, 25 mg, Oral, BID, Eugenie Filler, MD, 25 mg  at 09/29/15 2100 .  famotidine (PEPCID) tablet 20 mg, 20 mg, Oral, BID, Eugenie Filler, MD, 20 mg at 09/29/15 2100 .  fluticasone (FLONASE) 50 MCG/ACT nasal spray 2 spray, 2 spray, Each Nare, Daily, Eugenie Filler, MD, 2 spray at 09/29/15 1232 .  furosemide (LASIX) tablet 40 mg, 40 mg, Oral, Daily, Leonie Man, MD, 40 mg at 09/29/15 0931 .  guaiFENesin (MUCINEX) 12 hr tablet 1,200 mg, 1,200 mg, Oral, BID, Eugenie Filler, MD, 1,200 mg at 09/29/15 2100 .  hydrALAZINE (APRESOLINE) injection 10 mg, 10 mg, Intravenous, Q6H PRN, Peter M Martinique, MD .  HYDROcodone-acetaminophen (NORCO/VICODIN) 5-325 MG per tablet 1 tablet, 1 tablet, Oral, Q6H PRN, Ivor Costa, MD .  insulin aspart (novoLOG) injection 0-5 Units, 0-5 Units, Subcutaneous, QHS, Ivor Costa, MD, 3 Units at 09/28/15 2300 .  insulin aspart (novoLOG) injection 0-9 Units, 0-9 Units, Subcutaneous, TID WC, Ivor Costa, MD, 3 Units at 09/30/15 0630 .  insulin glargine (LANTUS) injection 15 Units, 15 Units, Subcutaneous, BH-q7a, Eugenie Filler, MD .  ipratropium (ATROVENT) nebulizer solution 0.5 mg, 0.5 mg, Nebulization, Q2H PRN, Eugenie Filler, MD .  levalbuterol [facility]) nebulizer solution 0.63 mg, 0.63 mg, Nebulization, Q2H PRN, Eugenie Filler, MD .  metoprolol tartrate (LOPRESSOR) tablet 100 mg, 100 mg, Oral, BID, Ivor Costa, MD, 100 mg at 09/29/15 2100 .  nicotine (NICODERM CQ - dosed in mg/24 hours) patch 21 mg, 21 mg, Transdermal, Daily, Ivor Costa, MD, 21 mg at 09/29/15 0933 .  nitroGLYCERIN (NITROSTAT) SL tablet 0.4 mg, 0.4 mg, Sublingual, Q5 min PRN, Ivor Costa, MD .  nitroGLYCERIN 50 mg in dextrose 5 % 250 mL (0.2 mg/mL) infusion, 0-200 mcg/min, Intravenous, Titrated, Leonie Man, MD, Stopped at 09/26/15 1315 .  ondansetron (ZOFRAN) injection 4 mg, 4 mg, Intravenous, Q6H PRN, Ivor Costa, MD .  polyethylene glycol (MIRALAX / GLYCOLAX) packet 17 g, 17 g, Oral, BID PRN, Ivor Costa, MD .  predniSONE (DELTASONE) tablet 60 mg, 60  mg, Oral, QAC breakfast, Eugenie Filler, MD, 60 mg at 09/30/15 [phone] .  ramipril (ALTACE) capsule 10 mg, 10 mg, Oral, Daily, Ivor Costa, MD, 10 mg at 09/29/15 0931 .  sodium chloride flush (NS) 0.9 % injection 3 mL, 3 mL, Intravenous, PRN, Ivor Costa, MD .  sodium chloride flush (NS) 0.9 % injection 3 mL, 3 mL, Intravenous, Q12H, Peter M Martinique, MD, 3 mL at 09/29/15 0934 .  sodium chloride flush (NS) 0.9 % injection 3 mL, 3 mL, Intravenous,  PRN, Peter M Martinique, MD .  vancomycin [facility]) IVPB 1000 mg/200 mL premix, 1,000 mg, Intravenous, Q12H, Eugenie Filler, MD, 1,000 mg at 09/30/15 0620 . nitroGLYCERIN Stopped (09/26/15 1315)    TELE:    Afib      Radiology/Studies: Dg Chest 2 View  Result Date: 09/29/2015 CLINICAL DATA:  Hypoxia.  Shortness of breath. EXAM: CHEST  2 VIEW COMPARISON:  09/26/2015 FINDINGS: Consolidation noted in the left lingula and left lower lobe compatible with pneumonia. No focal opacity on the right. Mild hyperinflation/COPD. Heart is borderline in size. No visible effusions. No acute bony abnormality. IMPRESSION: Hyperinflation/COPD. Lingular and left lower lobe pneumonia. Electronically Signed   By: Rolm Baptise M.D.   On: 09/29/2015 15:14     Current Medications:  . apixaban  5 mg Oral BID  . arformoterol  15 mcg Nebulization BID  . aspirin  81 mg Oral Daily  . atorvastatin  20 mg Oral q1800  . budesonide (PULMICORT) nebulizer solution  0.25 mg Nebulization BID  . ceFEPime (MAXIPIME) IV  1 g Intravenous Q12H  . clopidogrel  75 mg Oral Q breakfast  . clotrimazole  1 application Topical BID  . diltiazem  120 mg Oral Daily  . diltiazem  120 mg Oral Once  . diphenhydrAMINE  25 mg Oral BID  . famotidine  20 mg Oral BID  . fluticasone  2 spray Each Nare Daily  . furosemide  40 mg Oral Daily  . guaiFENesin  1,200 mg Oral BID  . insulin aspart  0-5 Units Subcutaneous QHS  . insulin aspart  0-9 Units Subcutaneous TID WC  . insulin glargine  15 Units  Subcutaneous BH-q7a  . metoprolol  100 mg Oral BID  . nicotine  21 mg Transdermal Daily  . predniSONE  60 mg Oral QAC breakfast  . ramipril  10 mg Oral Daily  . sodium chloride flush  3 mL Intravenous Q12H  . vancomycin  1,000 mg Intravenous Q12H   . nitroGLYCERIN Stopped (09/26/15 1315)    ASSESSMENT AND PLAN: Principal Problem:   Acute on chronic combined systolic and diastolic congestive heart failure (HCC) Active Problems:   DM (diabetes mellitus), type 2 with peripheral vascular complications (HCC)   Dyslipidemia   Essential hypertension   CAD S/P percutaneous coronary angioplasty   Chronic atrial fibrillation ([place]): CHA2DS2Vasc = 5. On Warfarin. Rate control   Abdominal aortic aneurysm (HCC)   TOBACCO USER   Chest pain   SOB (shortness of breath)   CKD (chronic kidney disease), stage III   Progressive angina (HCC)   Diastolic congestive heart failure (HCC)   Paroxysmal atrial fibrillation (HCC)   Unstable angina (HCC)   Allergic reaction to contrast dye   Accelerated hypertension with diastolic congestive heart failure, NYHA class 3 ([place])   Acute respiratory failure with hypoxia ([place])   HCAP (healthcare-associated pneumonia)   Hypoxia  1. CAD:  s/p PCI to OM. Continue triple therapy for one month with ASA, Plavix and Eliquis. Then, drop ASA after 30 days and continue Plavix and Eliquis.   Developed allergic reaction to contrast dye post cath, had some bronchospasm, has been treated and this has resolved. Per note yesterday, ok to discharge from cardiac standpoint.      2. HTN: Continue  beta blocker, diltiazem, and ACE-I.   3. Paroxysmal atrial fibrillation: Rate controlled with beta blocker and diltiazem.  This patients CHA2DS2-VASc Score and unadjusted Ischemic Stroke Rate (% per year) is equal to 4.8 % stroke  rate/year from a score of 4 Above score calculated as 1 point each if present [CHF, HTN, DM, Vascular=MI/PAD/Aortic Plaque, Age if 65-74, or Male], 2  points each if present [Age > 75, or Stroke/TIA/TE]   Continue Eliquis.   4. CKD STAGE III: Creatinine stable post cath.   5. HCAP: Treated per primary team, on IV Vanc and cefepime.   6. HLD: LDL is 91, continue high intensity statin.    Signed, Arbutus Leas , NP 9:23 AM 09/30/2015 Pager [phone]  Patient seen, examined. Available data reviewed. Agree with findings, assessment, and plan as outlined by Jettie Booze, NP. The patient is independently interviewed and examined. He is alert and oriented, in no distress. JVP is normal. Lungs are clear. Heart is regular rate and rhythm without murmur or gallop. Abdomen is soft and nontender. There is no peripheral edema. The patient's cardiac status is stable on anticoagulation, now on oral diuretics. Anticoagulation strategy as outlined above.  Sherren Mocha, M.D. 09/30/2015 4:45 PM

## 2015-09-30 NOTE — Progress Notes (Signed)
Patient is currently admitted, followup appt today will be cancelled.   Hilbert Corrigan PA Pager: [phone]      ROS   This encounter was created in error - please disregard.

## 2015-09-30 NOTE — Care Management Important Message (Signed)
Important Message  Patient Details  Name: Jason Fisher MRN: [id number] Date of Birth: 1940/07/10   Medicare Important Message Given:  Yes    Ivey Nembhard Montine Circle 09/30/2015, 11:53 AM

## 2015-09-30 NOTE — Progress Notes (Signed)
CARDIAC REHAB PHASE I   PRE:  Rate/Rhythm: 90 afib    BP: sitting 136/71    SaO2: 90 RA  MODE:  Ambulation: 760 ft   POST:  Rate/Rhythm: 120 afib    BP: sitting 122/83     SaO2: 94 RA  Tolerated well. Pt feeling well and not requiring O2. Denied c/o. Teach back done. Determined not to smoke.  [place], ACSM 09/30/2015 8:52 AM

## 2015-10-01 LAB — BASIC METABOLIC PANEL
Anion gap: 11 (ref 5–15)
BUN: 27 mg/dL — AB (ref 6–20)
CALCIUM: 8.8 mg/dL — AB (ref 8.9–10.3)
CO2: 27 mmol/L (ref 22–32)
Chloride: 100 mmol/L — ABNORMAL LOW (ref 101–111)
Creatinine, Ser: 1.22 mg/dL (ref 0.61–1.24)
GFR calc Af Amer: >60 mL/min (ref >60)
GFR, EST NON AFRICAN AMERICAN: 57 mL/min — AB (ref >60)
GLUCOSE: 334 mg/dL — AB (ref 65–99)
Potassium: 4.2 mmol/L (ref 3.5–5.1)
Sodium: 138 mmol/L (ref 135–145)

## 2015-10-01 LAB — MAGNESIUM: Magnesium: 2.4 mg/dL (ref 1.7–2.4)

## 2015-10-01 LAB — GLUCOSE, CAPILLARY
GLUCOSE-CAPILLARY: 322 mg/dL — AB (ref 65–99)
Glucose-Capillary: 176 mg/dL — ABNORMAL HIGH (ref 65–99)
Glucose-Capillary: 343 mg/dL — ABNORMAL HIGH (ref 65–99)
Glucose-Capillary: 269 mg/dL — ABNORMAL HIGH (ref 65–99)

## 2015-10-01 LAB — CBC
HCT: 42.7 % (ref 39.0–52.0)
Hemoglobin: 13.8 g/dL (ref 13.0–17.0)
MCH: 29.2 pg (ref 26.0–34.0)
MCHC: 32.3 g/dL (ref 30.0–36.0)
MCV: 90.3 fL (ref 78.0–100.0)
PLATELETS: 170 10*3/uL (ref 150–400)
RBC: 4.73 MIL/uL (ref 4.22–5.81)
RDW: 13.8 % (ref 11.5–15.5)
WBC: 10.6 10*3/uL — ABNORMAL HIGH (ref 4.0–10.5)

## 2015-10-01 MED ORDER — INSULIN ASPART 100 UNIT/ML ~~LOC~~ SOLN
5.0000 [IU] | Freq: Three times a day (TID) | SUBCUTANEOUS | Status: DC
  Administered 2015-10-01 – 2015-10-02 (×4): 5 [IU] via SUBCUTANEOUS

## 2015-10-01 MED ORDER — PREDNISONE 20 MG PO TABS
40.0000 mg | ORAL_TABLET | Freq: Every day | ORAL | Status: DC
  Administered 2015-10-02: 40 mg via ORAL
  Filled 2015-10-01 (×2): qty 2

## 2015-10-01 NOTE — Progress Notes (Signed)
SATURATION QUALIFICATIONS: (This note is used to comply with regulatory documentation for home oxygen)  Patient Saturations on Room Air at Rest = 95%  Patient Saturations on Room Air while Ambulating = 93%  Patient Saturations on 0Liters of oxygen while Ambulating = 94%  Please briefly explain why patient needs home oxygen: 

## 2015-10-01 NOTE — Progress Notes (Signed)
PROGRESS NOTE    Jason Fisher  [id number] DOB: Mar 01, 1940 DOA: 09/25/2015 PCP: Nyoka Cowden, MD    Brief Narrative:   Jason Fisher is a 75 y.o. male with medical history significant of hypertension, hyperlipidemia, diabetes mellitus, CAD, MI, s/p of stent, Afib on Eliquis, AAA, CKD-III, who presents with a shortness of breath, chest pain and leg edema.  Patient reports that he has been having intermittent chest pain for about 2 month. The chest pain is located in the front chest, pressure-like, nonradiating, 5 out of 10 in severity. No tenderness over calf areas. Patient has cough with clear mucus production. In the past 2 days, he noted bilateral leg edema which is new for patient. No fever or chills. Patient denies nausea, vomiting, abdominal pain, symptoms of UTI, diarrhea, unilateral weakness.  ED Course: pt was found to have BNP 155.5, negative troponin, WBC 6.6, temperature normal, stable renal function. Chest x-ray showed vascular congestion consistent with CHF. Patient is placed on telemetry bed for observation.  Patient was admitted placed on IV diuretics for diuresis with some clinical improvement. Cardiology consultation was obtained and patient underwent cardiac catheterization on 09/26/2015 which showed a new 90% stenosis in the second OM status post stenting with bio Freet on stent. Patient developed an allergic reaction to the contrast dye with hives and bronchospasms was transferred to the stepdown unit placed on IV Solu-Medrol, IV Pepcid and Benadryl with clinical improvement.  Patient also noted to have a hypoxia. Chest x-ray done was concerning for lingular left lower lobe pneumonia. Patient was started empirically on IV vancomycin and IV cefepime. Preliminary blood cultures with gram-positive cocci in clusters/coagulase negative staph. Awaiting blood cultures to finalize.    Assessment & Plan:   Principal Problem:   Acute on chronic combined systolic  and diastolic congestive heart failure (HCC) Active Problems:   Chest pain   Allergic reaction to contrast dye   HCAP (healthcare-associated pneumonia)   Hypoxia   DM (diabetes mellitus), type 2 with peripheral vascular complications (HCC)   Dyslipidemia   Essential hypertension   CAD S/P percutaneous coronary angioplasty   Chronic atrial fibrillation ([place]): CHA2DS2Vasc = 5. On Warfarin. Rate control   Abdominal aortic aneurysm (HCC)   TOBACCO USER   SOB (shortness of breath)   CKD (chronic kidney disease), stage III   Progressive angina (HCC)   Diastolic congestive heart failure (HCC)   Paroxysmal atrial fibrillation (HCC)   Unstable angina (HCC)   Accelerated hypertension with diastolic congestive heart failure, NYHA class 3 (HCC)   Acute respiratory failure with hypoxia (HCC)  #1 acute on chronic combined systolic and diastolic heart failure Questionable etiology.Likely secondary to acute coronary syndrome as patient with complaints of chest pain on exertion and occasionally at rest. Patient also noted on admission to have some orthopnea with lower extremity edema and shortness of breath. Patient with clinical improvement. Strict I's and O have not been recorded accurately initially during the hospitalization. Patient is - 5.976 L during this hospitalization. Patient's weight today is 89.7kg from 90 kg from 91.3 kg from 90.1 kg from 90 kg from 93.62 kg from 94.031 kg(09/25/2015) from 95.709 kg (09/24/2015). On 09/06/2015 patient's weight was 92.26 kg during the office visit. Patient with clinical improvement. Cardiac catheterization noted to have a new 90% stenosis and large second OM status post stenting with bioftreedom stent. Continue Lopressor, Lipitor, Cardizem, ramipril, Plavix, aspirin, Eliquis. Continue oral Lasix per cardiology. Will discontinue Actos on discharge. Cardiology following and appreciate input  and recommendations.  #2 unstable angina/3 vessel coronary artery disease  status post stenting to new 90% stenosis and large second OM 09/26/2015 Patient presented with chest pain ongoing for the past 2 months worse on exertion with associated shortness of breath and concern for angina. Patient with history of coronary artery disease with cath in 2009 with DES placed to RCA for in-stent restenosis. Patient with clinical improvement after diureses and stent placement. Patient has been seen in consultation by cardiology and patient s/p cardiac catheterization 09/26/2015 with new 90% stenosis in the large second OM status post stenting of second OM with a Biofreedom stent, chronic severe stenosis in the first diagonal, stents in proximal and mid RCA are patent, aneurysm or stenosis in the PL OM branch of the RCA-the aneurysm is new compared to 2009, mild LV dysfunction, elevated LVEDP.  2-D echo with EF of 55-60% with no wall motion abnormalities. Continue Lopressor, Lipitor, Cardizem, ramipril, eliquis, ASA, plavix, oral Lasix. Per cardiology patient is to continue dual antiplatelet therapy times one month and then discontinue aspirin. Cardiology following and appreciate input and recommendations.  #3 allergic reaction to contrast dye Patient noted to have an allergic reaction to contrast dye during cardiac catheterization on 09/26/2015 with hives and bronchospasms. Patient was placed on nebulizer treatments, IV steroids, IV Pepcid, Benadryl with clinical improvement. Patient currently 86 % on room air with no increased work of breathing. Continue Benadryl to 25 mg daily x 2 days. Contune Pepcid 20 mg daily x 2 days. Change prednisone 40 mg daily and taper. Continue scheduled nebulizers, Pulmicort and Brovana. Follow.  #4 hypoxia likely secondary to healthcare associated pneumonia Patient noted to be hypoxic with sats of 86% on room air on 09/29/2015. Chest x-ray done consistent with a lingular and left lower lobe pneumonia. Patient also noted to have a leukocytosis although also on  steroids. Leukocytosis is trending down. Clinical improvement. Patient with sats of 93% on room air. Sputum Gram stain and cultures pending. Urine Legionella antigen pending. Urine pneumococcus antigen negative. Continue Mucinex, IV vancomycin, IV cefepime, nebulizer treatments. Preliminary blood cultures with gram-positive cocci in clusters/coagulase-negative staph. Continue empiric IV antibiotics until cultures are finalized. Once cultures are finalized could likely transition to oral Levaquin to complete course of antibiotic treatment.   #5 blood cultures Preliminary blood cultures with gram-positive cocci in clusters/coagulase-negative staph however results are still preliminary and not final at this time. Continue empiric IV antibiotics until cultures are finalized.  #6 hypertension Blood pressure 144/99. Continue Lopressor, ramipril, Cardizem. Follow.   #7 atrial fibrillation  CHA2DS2VASC  Score 5. Patient was rate controlled on Lopressor and Cardizem. Patient was on IV heparin precardiac catheterization. Eliquis has been resumed. Per cardiology.  #8 chronic kidney disease stage III Baseline creatinine 1.2-1.3. Creatinine trending back down and was at 1.13 (09/30/2015) from 1.34 from 1.66 from 1.40 from 1.28. IV diuretics have been changed to oral diuretics. Monitor closely with diuretics. Labs pending. Follow.  #9 hyperlipidemia LDL was 91 08/30/2015 Continue Lipitor.  #10 diabetes mellitus type 2 Hemoglobin A1c 6.7 on 08/24/2015. CBGs 176-271. Elevated CBGs likely in part due to steroids. Hold oral hypoglycemic agents. Increased Lantus 20 units daily. Sliding scale insulin. Follow CBGs with steroid taper. On discharge will discontinue Actos secondary to CHF. Outpatient follow-up.  #11 tobacco abuse Tobacco cessation. Nicotine patch.     DVT prophylaxis: eliquis Code Status: Full Family Communication: Updated patient. No family at bedside. Disposition Plan: Remain on telemetry.  Home when hypoxia has improved  and on oral antibiotics, and blood culture results have finalized, hopefully 24-48 hours.    Consultants:   Cardiology: Dr. Ellyn Hack 09/25/2015  Procedures:   Chest x-ray 09/24/2015, 09/29/2015  Cardiac catheterization 09/26/2015--- three-vessel obstructive coronary artery disease, chronic severe stenosis in the first diagonal, new 90% stenosis in a large second OM status post stenting with bio Freedom stent, stents in the proximal and mid RCA are patent, aneurysm and stenosis in the PL OM branch of the RCA-aneurysm is new compared to 2009, mild LV dysfunction, elevated LVEDP, allergic response to contrast with hives and bronchospasms.  2-D echo 09/27/2015  Antimicrobials:  IV vancomycin 09/29/2015  IV cefepime 09/29/2015   Subjective: Patient sitting up in bed. Patient denies any chest pain. Patient denies any shortness of breath. Patient states feeling better this morning. Patient with sats of 93% on room air.  Objective: Vitals:   09/30/15 2023 09/30/15 2028 10/01/15 0500 10/01/15 0558  BP:  135/84 (!) 143/73   Pulse: 91 (!) 104 71   Resp: 18 18 18    Temp:  97.5 F (36.4 C) 99 F (37.2 C)   TempSrc:  Oral Oral   SpO2: 93% 93% 93%   Weight:    89.7 kg (197 lb 11.2 oz)  Height:        Intake/Output Summary (Last 24 hours) at 10/01/15 1008 Last data filed at 10/01/15 0924  Gross per 24 hour  Intake             1283 ml  Output              720 ml  Net              563 ml   Filed Weights   09/29/15 0539 09/30/15 0550 10/01/15 0558  Weight: 91.3 kg (201 lb 4.8 oz) 90 kg (198 lb 6.4 oz) 89.7 kg (197 lb 11.2 oz)    Examination:  General exam: Appears calm and comfortable.   Respiratory system: CTAB. No wheezing. Respiratory effort normal. Cardiovascular system: S1 & S2 heard, RRR. No JVD, murmurs, rubs, gallops or clicks.No lower extremity edema Gastrointestinal system: Abdomen is nondistended, soft and nontender. No organomegaly or  masses felt. Normal bowel sounds heard. Central nervous system: Alert and oriented. No focal neurological deficits. Extremities: Symmetric 5 x 5 power. Skin: No rashes, lesions or ulcers Psychiatry: Judgement and insight appear normal. Mood & affect appropriate.     Data Reviewed: I have personally reviewed following labs and imaging studies  CBC:  Recent Labs Lab 09/24/15 2038 09/26/15 0227 09/27/15 0154 09/29/15 0212 09/30/15 0516  WBC 6.6 7.2 9.4 15.5* 9.4  HGB 13.1 13.2 13.8 13.5 13.5  HCT 41.8 42.1 42.9 40.8 41.7  MCV 92.3 92.5 89.6 88.9 90.3  PLT 148* 143* 171 182 [id number]*   Basic Metabolic Panel:  Recent Labs Lab 09/25/15 0815 09/26/15 0227 09/27/15 0154 09/28/15 0248 09/29/15 0212 09/30/15 0516  NA  --  142 140 141 140 140  K  --  4.0 3.0* 3.8 3.7 3.6  CL  --  106 99* 103 104 103  CO2  --  29 29 29 28 29   GLUCOSE  --  102* 171* 190* 194* 181*  BUN  --  21* 20 35* 36* 30*  CREATININE  --  1.28* 1.40* 1.66* 1.34* 1.13  CALCIUM  --  9.2 9.2 9.3 9.0 8.7*  MG 2.1 2.2 2.1  --   --   --    GFR: Estimated Creatinine Clearance:  62.4 mL/min (by C-G formula based on SCr of 1.13 mg/dL). Liver Function Tests: No results for input(s): AST, ALT, ALKPHOS, BILITOT, PROT, ALBUMIN in the last 168 hours. No results for input(s): LIPASE, AMYLASE in the last 168 hours. No results for input(s): AMMONIA in the last 168 hours. Coagulation Profile:  Recent Labs Lab 09/25/15 0221  INR 1.22   Cardiac Enzymes:  Recent Labs Lab 09/25/15 0221 09/25/15 0815 09/25/15 1246 09/26/15 1030 09/27/15 0154  TROPONINI <0.03 <0.03 <0.03 <0.03 0.06*   BNP (last 3 results) No results for input(s): PROBNP in the last 8760 hours. HbA1C: No results for input(s): HGBA1C in the last 72 hours. CBG:  Recent Labs Lab 09/30/15 0629 09/30/15 1145 09/30/15 1649 09/30/15 2136 10/01/15 0636  GLUCAP 234* 242* 260* 271* 176*   Lipid Profile: No results for input(s): CHOL, HDL, LDLCALC,  TRIG, CHOLHDL, LDLDIRECT in the last 72 hours. Thyroid Function Tests: No results for input(s): TSH, T4TOTAL, FREET4, T3FREE, THYROIDAB in the last 72 hours. Anemia Panel: No results for input(s): VITAMINB12, FOLATE, FERRITIN, TIBC, IRON, RETICCTPCT in the last 72 hours. Sepsis Labs: No results for input(s): PROCALCITON, LATICACIDVEN in the last 168 hours.  Recent Results (from the past 240 hour(s))  MRSA PCR Screening     Status: None   Collection Time: 09/26/15  1:30 PM  Result Value Ref Range Status   MRSA by PCR NEGATIVE NEGATIVE Final    Comment:        The GeneXpert MRSA Assay (FDA approved for NASAL specimens only), is one component of a comprehensive MRSA colonization surveillance program. It is not intended to diagnose MRSA infection nor to guide or monitor treatment for MRSA infections.   Culture, blood (routine x 2) Call MD if unable to obtain prior to antibiotics being given     Status: Abnormal (Preliminary result)   Collection Time: 09/29/15  6:08 PM  Result Value Ref Range Status   Specimen Description BLOOD RIGHT HAND  Final   Special Requests IN PEDIATRIC BOTTLE 2CC  Final   Culture  Setup Time   Final    GRAM POSITIVE COCCI IN CLUSTERS IN PEDIATRIC BOTTLE CRITICAL RESULT CALLED TO, READ BACK BY AND VERIFIED WITH: J. MARKLE, PHARM AT 2057 ON [id number] BY Rhea Bleacher    Culture (A)  Final    STAPHYLOCOCCUS SPECIES (COAGULASE NEGATIVE) THE SIGNIFICANCE OF ISOLATING THIS ORGANISM FROM A SINGLE VENIPUNCTURE CANNOT BE PREDICTED WITHOUT FURTHER CLINICAL AND CULTURE CORRELATION. SUSCEPTIBILITIES AVAILABLE ONLY ON REQUEST.    Report Status PENDING  Incomplete         Radiology Studies: Dg Chest 2 View  Result Date: 09/29/2015 CLINICAL DATA:  Hypoxia.  Shortness of breath. EXAM: CHEST  2 VIEW COMPARISON:  09/26/2015 FINDINGS: Consolidation noted in the left lingula and left lower lobe compatible with pneumonia. No focal opacity on the right. Mild  hyperinflation/COPD. Heart is borderline in size. No visible effusions. No acute bony abnormality. IMPRESSION: Hyperinflation/COPD. Lingular and left lower lobe pneumonia. Electronically Signed   By: Rolm Baptise M.D.   On: 09/29/2015 15:14        Scheduled Meds: . apixaban  5 mg Oral BID  . arformoterol  15 mcg Nebulization BID  . aspirin  81 mg Oral Daily  . atorvastatin  20 mg Oral q1800  . budesonide (PULMICORT) nebulizer solution  0.25 mg Nebulization BID  . ceFEPime (MAXIPIME) IV  1 g Intravenous Q12H  . clopidogrel  75 mg Oral Q breakfast  . clotrimazole  1 application Topical BID  . diltiazem  120 mg Oral Daily  . diltiazem  120 mg Oral Once  . diphenhydrAMINE  25 mg Oral Daily  . famotidine  20 mg Oral Daily  . fluticasone  2 spray Each Nare Daily  . furosemide  40 mg Oral Daily  . guaiFENesin  1,200 mg Oral BID  . insulin aspart  0-5 Units Subcutaneous QHS  . insulin aspart  0-9 Units Subcutaneous TID WC  . insulin glargine  20 Units Subcutaneous BH-q7a  . metoprolol  100 mg Oral BID  . nicotine  21 mg Transdermal Daily  . predniSONE  60 mg Oral QAC breakfast  . ramipril  10 mg Oral Daily  . sodium chloride flush  3 mL Intravenous Q12H  . vancomycin  1,000 mg Intravenous Q12H   Continuous Infusions: . nitroGLYCERIN Stopped (09/26/15 1315)     LOS: 5 days    Time spent: 61 mins    Tyreek Clabo, MD [facility] Pager [phone]  If 7PM-7AM, please contact night-coverage [email or web address] Password [email or web address] 10/01/2015, 10:08 AM

## 2015-10-01 NOTE — Progress Notes (Signed)
    Patient Profile: 75 year old with CAD, atrial fib admitted with chest pain, SOB and swelling. He was admitted for IV diuresis. He had cardiac catheterization on 09/26/2015 which showed a new 90% stenosis in the second OM status post stenting with bio Freet on stent. Patient developed an allergic reaction to the contrast dye with hives and bronchospasms was transferred to the stepdown unit placed on IV Solu-Medrol, IV Pepcid and Benadryl with clinical improvement. Later on developed pneumonia and positive blood cultures with coagulase negative staph.  Subjective: Pt. Was feeling better, denies any cp or SOB.  Objective:  Vital signs in last 24 hours: Vitals:   09/30/15 2028 10/01/15 0500 10/01/15 0558 10/01/15 1150  BP: 135/84 (!) 143/73  140/84  Pulse: (!) 104 71  93  Resp: 18 18  18   Temp: 97.5 F (36.4 C) 99 F (37.2 C)  97.4 F (36.3 C)  TempSrc: Oral Oral  Oral  SpO2: 93% 93%  96%  Weight:   197 lb 11.2 oz (89.7 kg)   Height:       PHYSICAL EXAM General: Well developed, well nourished, male, in no acute distress..             Neck: Supple without bruits, no JVD. Lungs:  Resp regular and unlabored, rhonchi in left upper and middle lobes Heart: RRR, S1, S2, no S3, S4, or murmur; no rub. Abdomen: Soft, non-tender, non-distended, BS + x 4.  Extremities: No clubbing, cyanosis, generalized edema.   Labs: CBC Latest Ref Rng & Units 10/01/2015 09/30/2015 09/29/2015  WBC 4.0 - 10.5 K/uL 10.6(H) 9.4 15.5(H)  Hemoglobin 13.0 - 17.0 g/dL 13.8 13.5 13.5  Hematocrit 39.0 - 52.0 % 42.7 41.7 40.8  Platelets 150 - 400 K/uL 170 146(L) 182   BMP Latest Ref Rng & Units 10/01/2015 09/30/2015 09/29/2015  Glucose 65 - 99 mg/dL 334(H) 181(H) 194(H)  BUN 6 - 20 mg/dL 27(H) 30(H) 36(H)  Creatinine 0.61 - 1.24 mg/dL 1.22 1.13 1.34(H)  Sodium 135 - 145 mmol/L 138 140 140  Potassium 3.5 - 5.1 mmol/L 4.2 3.6 3.7  Chloride 101 - 111 mmol/L 100(L) 103 104  CO2 22 - 32 mmol/L 27 29 28   Calcium 8.9  - 10.3 mg/dL 8.8(L) 8.7(L) 9.0   Assessment/Plan:  1:CAD: s/p PCI to OM. Continue triple therapy for one month with ASA, Plavix and Eliquis. Then, drop ASA after 30 days and continue Plavix and Eliquis.  Developed allergic reaction to contrast dye post cath, had some bronchospasm, has been treated and this has resolved. Per note yesterday, ok to discharge from cardiac standpoint.      2. HTN: Continue beta blocker, diltiazem, and ACE-I.   3. Paroxysmal atrial fibrillation: Rate controlled with beta blocker and diltiazem. -Continue Eliquis.   4. CKD STAGE III: Creatinine stable post cath.   5. HCAP: On IV Vanc and cefepime.   6. HLD: LDL is 91, continue high intensity statin.  Dispo: Anticipated discharge in approximately 1-2 day(s).   Lorella Nimrod, MD 10/01/2015, 1:20 PM Pager: [phone]

## 2015-10-01 NOTE — Progress Notes (Signed)
Inpatient Diabetes Program Recommendations  AACE/ADA: New Consensus Statement on Inpatient Glycemic Control (2015)  Target Ranges:  Prepandial:   less than 140 mg/dL      Peak postprandial:   less than 180 mg/dL (1-2 hours)      Critically ill patients:  140 - 180 mg/dL   Lab Results  Component Value Date   GLUCAP 343 (H) 10/01/2015   HGBA1C 6.7 (H) 09/25/2015    Review of Glycemic Control:  Results for AMIN, DORROUGH (MRN [id number]) as of 10/01/2015 12:35  Ref. Range 10/01/2015 06:36 10/01/2015 11:22  Glucose-Capillary Latest Ref Range: 65 - 99 mg/dL 176 (H) 343 (H)   Diabetes history: Type 2 diabetes Outpatient Diabetes medications: Amaryl 4 mg with breakfast, Metformin 1000 mg bid, Actos 45 mg daily Current orders for Inpatient glycemic control:  Novolog sensitive tid with meals and HS, Lantus 20 units q AM, Prednisone 40 mg daily with breakfast  Inpatient Diabetes Program Recommendations:  Please consider adding Novolog meal coverage 5 units tid with meals-while patient is on PO Prednisone. Text page sent.  Thanks, Adah Perl, RN, BC-ADM Inpatient Diabetes Coordinator Pager [phone] (8a-5p)

## 2015-10-02 DIAGNOSIS — J189 Pneumonia, unspecified organism: Secondary | ICD-10-CM

## 2015-10-02 DIAGNOSIS — N183 Chronic kidney disease, stage 3 (moderate): Secondary | ICD-10-CM

## 2015-10-02 LAB — BASIC METABOLIC PANEL
Anion gap: 9 (ref 5–15)
BUN: 27 mg/dL — AB (ref 6–20)
CO2: 25 mmol/L (ref 22–32)
CREATININE: 1.09 mg/dL (ref 0.61–1.24)
Calcium: 8.7 mg/dL — ABNORMAL LOW (ref 8.9–10.3)
Chloride: 105 mmol/L (ref 101–111)
GFR calc Af Amer: >60 mL/min (ref >60)
GLUCOSE: 143 mg/dL — AB (ref 65–99)
POTASSIUM: 3.6 mmol/L (ref 3.5–5.1)
Sodium: 139 mmol/L (ref 135–145)

## 2015-10-02 LAB — GLUCOSE, CAPILLARY
GLUCOSE-CAPILLARY: 245 mg/dL — AB (ref 65–99)
Glucose-Capillary: 133 mg/dL — ABNORMAL HIGH (ref 65–99)
Glucose-Capillary: 241 mg/dL — ABNORMAL HIGH (ref 65–99)
Glucose-Capillary: 274 mg/dL — ABNORMAL HIGH (ref 65–99)

## 2015-10-02 LAB — CBC
HCT: 39.7 % (ref 39.0–52.0)
Hemoglobin: 12.8 g/dL — ABNORMAL LOW (ref 13.0–17.0)
MCH: 28.8 pg (ref 26.0–34.0)
MCHC: 32.2 g/dL (ref 30.0–36.0)
MCV: 89.2 fL (ref 78.0–100.0)
PLATELETS: 152 10*3/uL (ref 150–400)
RBC: 4.45 MIL/uL (ref 4.22–5.81)
RDW: 13.7 % (ref 11.5–15.5)
WBC: 11 10*3/uL — ABNORMAL HIGH (ref 4.0–10.5)

## 2015-10-02 LAB — CULTURE, BLOOD (ROUTINE X 2)

## 2015-10-02 LAB — LEGIONELLA PNEUMOPHILA SEROGP 1 UR AG: L. PNEUMOPHILA SEROGP 1 UR AG: NEGATIVE

## 2015-10-02 MED ORDER — ASPIRIN 81 MG PO CHEW: 81.0000 mg | CHEWABLE_TABLET | Freq: Every day | ORAL | 0 refills | Status: DC

## 2015-10-02 MED ORDER — DEXTROSE 5 % IV SOLN
1.0000 g | Freq: Three times a day (TID) | INTRAVENOUS | Status: DC
  Administered 2015-10-02: 1 g via INTRAVENOUS
  Filled 2015-10-02 (×3): qty 1

## 2015-10-02 MED ORDER — INSULIN ASPART 100 UNIT/ML ~~LOC~~ SOLN
7.0000 [IU] | Freq: Three times a day (TID) | SUBCUTANEOUS | Status: DC
  Administered 2015-10-03 (×2): 7 [IU] via SUBCUTANEOUS

## 2015-10-02 MED ORDER — LEVOFLOXACIN 500 MG PO TABS
500.0000 mg | ORAL_TABLET | Freq: Every day | ORAL | Status: DC
  Administered 2015-10-02: 500 mg via ORAL
  Filled 2015-10-02: qty 1

## 2015-10-02 MED ORDER — PREDNISONE 20 MG PO TABS
20.0000 mg | ORAL_TABLET | Freq: Every day | ORAL | Status: DC
  Administered 2015-10-03: 20 mg via ORAL
  Filled 2015-10-02: qty 1

## 2015-10-02 MED ORDER — INSULIN ASPART 100 UNIT/ML ~~LOC~~ SOLN
0.0000 [IU] | Freq: Every day | SUBCUTANEOUS | Status: DC
  Administered 2015-10-02: 3 [IU] via SUBCUTANEOUS

## 2015-10-02 MED ORDER — FUROSEMIDE 40 MG PO TABS: 40.0000 mg | ORAL_TABLET | Freq: Every day | ORAL | 1 refills | Status: DC

## 2015-10-02 MED ORDER — INSULIN ASPART 100 UNIT/ML ~~LOC~~ SOLN
0.0000 [IU] | Freq: Three times a day (TID) | SUBCUTANEOUS | Status: DC
  Administered 2015-10-03: 5 [IU] via SUBCUTANEOUS

## 2015-10-02 MED ORDER — INSULIN GLARGINE 100 UNIT/ML ~~LOC~~ SOLN
22.0000 [IU] | SUBCUTANEOUS | Status: DC
  Administered 2015-10-03: 22 [IU] via SUBCUTANEOUS
  Filled 2015-10-02: qty 0.22

## 2015-10-02 NOTE — Progress Notes (Signed)
PROGRESS NOTE  Jason Fisher [id number] DOB: 08/16/1940 DOA: 09/25/2015 PCP: Nyoka Cowden, MD  Brief History:  75 y.o.malewith medical history significant of hypertension, hyperlipidemia, diabetes mellitus, CAD, MI, s/p of stent, Afib on Eliquis, AAA, CKD-III, whopresents with a shortness of breath, chest pain and leg edema.  Patient reports that he has been having intermittent chest pain for about 2 month. The chest pain is located in the front chest, pressure-like, nonradiating, 5 out of 10 in severity. No tenderness over calf areas. Patient has cough with clear mucus production. In the past 2 days, he noted bilateral leg edema which is new for patient. No fever or chills. Patient denies nausea, vomiting, abdominal pain, symptoms of UTI, diarrhea, unilateral weakness.  pt was found to have BNP 155.5, negative troponin, WBC 6.6, temperature normal, stable renal function. Chest x-ray showed vascular congestion consistent with CHF. Patient was admitted placed on IV diuretics for diuresis with some clinical improvement. Cardiology consultation was obtained and patient underwent cardiac catheterization on 09/26/2015 which showed a new 90% stenosis in the second OM status post stenting with bio Freet on stent. Patient developed an allergic reaction to the contrast dye with hives and bronchospasms was transferred to the stepdown unit placed on IV Solu-Medrol, IV Pepcid and Benadryl with clinical improvement.  On 09/29/15, pt developed hypoxia.  Chest x-ray done was concerning for lingular left lower lobe pneumonia. Patient was started empirically on IV vancomycin and IV cefepime. Preliminary blood cultures with gram-positive cocci in clusters/coagulase negative staph.   Assessment/Plan: acute on chronic combined systolic and diastolic heart failure -noted on admission to have some orthopnea with lower extremity edema and shortness of breath.  -Patient with clinical  improvement.  -Strict I's and O--NEG 6 L  -NEG 7 lbs for the admssion  -Continue Lopressor, Lipitor, Cardizem, ramipril, Plavix, aspirin, Eliquis. Continue oral Lasix per cardiology.  -discontinue Actos on discharge. Cardiology following and appreciate input and recommendations.  unstable angina/3 vessel coronary artery disease status post stenting to new 90% stenosis second OM 09/26/2015 -chest pain ongoing for the past 2 months worse on exertion with associated shortness of breath and concern for angina. Patient with history of coronary artery disease with cath in 2009 with DES placed to RCA for in-stent restenosis -clinical improvement after diureses and stent placement.  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 -2-D echo with EF of 55-60% with no wall motion abnormalities.  -Continue Lopressor, Lipitor, Cardizem, ramipril, eliquis, ASA, plavix, oral Lasix. -continue dual antiplatelet therapy times one month and then discontinue aspirin. -Cardiology following and appreciate input and recommendations.  allergic reaction to contrast dye - allergic reaction to contrast dye during cardiac catheterization on 09/26/2015 with hives and bronchospasms.  -Patient was placed on nebulizer treatments, IV steroids, IV Pepcid, Benadryl with clinical improvement.  -currently 96-98 % on room air with no increased work of breathing.  -Patient had 5 days of diphenhydramine and famotidine - Change prednisone 40 mg daily and taper. -d/C scheduled nebulizers, Pulmicort and Brovana.  acute respiratory failure with hypoxia secondary to healthcare associated pneumonia -hypoxic with sats of 86% on room air on  09/29/2015.  -Chest x-ray  done consistent with a lingular and left lower lobe pneumonia.  -noted to have a leukocytosis although also on steroids.  -Leukocytosis is trending down. Clinical improvement -Discontinue vancomycin -D/C cefepime D#4-->start oral levofloxacin  Bacteremia -contaminant -d/c vanco  hypertension -Continue Lopressor, ramipril, Cardizem. Follow.   atrial fibrillation  CHA2DS2VASC  Score 5. -rate controlled on Lopressor and Cardizem.  -Patient was on IV heparin precardiac catheterization. Eliquis has been resumed.   chronic kidney disease stage III Baseline creatinine 1.2-1.5.   hyperlipidemia LDL was 91-- 08/30/2015 Continue Lipitor.  diabetes mellitus type 2 -Hemoglobin A1c 6.7 on 08/24/2015.   -Hold oral hypoglycemic agents.  -Increased Lantus 22 units daily. Increase novolog to 7 units tiw -change to moderate SSI -Sliding scale insulin. Follow CBGs with steroid taper.  -On discharge will discontinue Actos secondary to CHF.  tobacco abuse Tobacco cessation. Nicotine patch.    Disposition Plan:   Home in 8/31 if stable Family Communication:   Wife updated at bedside 8/30  Consultants:  none  Code Status:  FULL  DVT Prophylaxis:  apixaban   Procedures: As Listed in Progress Note Above  Antibiotics: None    Subjective: Patient denies fevers, chills, headache, chest pain, dyspnea, nausea, vomiting, diarrhea, abdominal pain, dysuria, hematuria, hematochezia, and melena.   Objective: Vitals:   10/02/15 0611 10/02/15 0646 10/02/15 0726 10/02/15 0727  BP: (!) 161/98 (!) 150/90    Pulse: 96 98    Resp:      Temp:      TempSrc:      SpO2:   97% 97%  Weight:      Height:        Intake/Output Summary (Last 24 hours) at 10/02/15 1530 Last data filed at 10/02/15 1359  Gross per 24 hour  Intake             1050 ml  Output             1325 ml  Net             -275 ml   Weight change: 0.771 kg (1 lb 11.2  oz) Exam:   General:  Pt is alert, follows commands appropriately, not in acute distress  HEENT: No icterus, No thrush, No neck mass, [place]/AT  Cardiovascular: RRR, S1/S2, no rubs, no gallops  Respiratory: CTA bilaterally, no wheezing, no crackles, no rhonchi  Abdomen: Soft/+BS, non tender, non distended, no guarding  Extremities: No edema, No lymphangitis, No petechiae, No rashes, no synovitis   Data Reviewed: I have personally reviewed following labs and imaging studies Basic Metabolic Panel:  Recent Labs Lab 09/26/15 0227 09/27/15 0154 09/28/15 0248 09/29/15 0212 09/30/15 0516 10/01/15 1101 10/02/15 0311  NA 142 140 141 140 140 138 139  K 4.0 3.0* 3.8 3.7 3.6 4.2 3.6  CL 106 99* 103 104 103 100* 105  CO2 29 29 29 28 29 27 25   GLUCOSE 102* 171* 190* 194* 181* 334* 143*  BUN 21* 20 35* 36* 30* 27* 27*  CREATININE 1.28* 1.40* 1.66* 1.34* 1.13 1.22 1.09  CALCIUM 9.2 9.2 9.3 9.0 8.7* 8.8* 8.7*  MG 2.2 2.1  --   --   --  2.4  --    Liver Function Tests: No results for input(s): AST, ALT, ALKPHOS, BILITOT, PROT, ALBUMIN in the last 168 hours. No results for input(s): LIPASE, AMYLASE in the last 168 hours. No results for input(s): AMMONIA in the last 168 hours. Coagulation Profile: No results for input(s): INR, PROTIME in the  last 168 hours. CBC:  Recent Labs Lab 09/27/15 0154 09/29/15 0212 09/30/15 0516 10/01/15 1101 10/02/15 0311  WBC 9.4 15.5* 9.4 10.6* 11.0*  HGB 13.8 13.5 13.5 13.8 12.8*  HCT 42.9 40.8 41.7 42.7 39.7  MCV 89.6 88.9 90.3 90.3 89.2  PLT 171 182 146* 170 152   Cardiac Enzymes:  Recent Labs Lab 09/26/15 1030 09/27/15 0154  TROPONINI <0.03 0.06*   BNP: Invalid input(s): POCBNP CBG:  Recent Labs Lab 10/01/15 1122 10/01/15 1632 10/01/15 2100 10/02/15 0555 10/02/15 1152  GLUCAP 343* 269* 322* 133* 241*   HbA1C: No results for input(s): HGBA1C in the last 72 hours. Urine analysis:    Component Value Date/Time   COLORURINE  YELLOW 01/14/2010 0629   APPEARANCEUR CLOUDY (A) 01/14/2010 0629   LABSPEC 1.024 01/14/2010 0629   PHURINE 7.0 01/14/2010 0629   GLUCOSEU 100 (A) 01/14/2010 0629   HGBUR LARGE (A) 01/14/2010 0629   HGBUR trace-intact 02/16/2007 1038   BILIRUBINUR n 08/30/2015 1045   KETONESUR NEGATIVE 01/14/2010 0629   PROTEINUR n 08/30/2015 1045   PROTEINUR NEGATIVE 01/14/2010 0629   UROBILINOGEN 1.0 08/30/2015 1045   UROBILINOGEN 1.0 01/14/2010 0629   NITRITE n 08/30/2015 1045   NITRITE NEGATIVE 01/14/2010 0629   LEUKOCYTESUR Negative 08/30/2015 1045   Sepsis Labs: @LABRCNTIP (procalcitonin:4,lacticidven:4) ) Recent Results (from the past 240 hour(s))  MRSA PCR Screening     Status: None   Collection Time: 09/26/15  1:30 PM  Result Value Ref Range Status   MRSA by PCR NEGATIVE NEGATIVE Final    Comment:        The GeneXpert MRSA Assay (FDA approved for NASAL specimens only), is one component of a comprehensive MRSA colonization surveillance program. It is not intended to diagnose MRSA infection nor to guide or monitor treatment for MRSA infections.   Culture, blood (routine x 2) Call MD if unable to obtain prior to antibiotics being given     Status: None (Preliminary result)   Collection Time: 09/29/15  6:00 PM  Result Value Ref Range Status   Specimen Description BLOOD LEFT HAND  Final   Special Requests BOTTLES DRAWN AEROBIC AND ANAEROBIC 10CC  Final   Culture NO GROWTH 3 DAYS  Final   Report Status PENDING  Incomplete  Culture, blood (routine x 2) Call MD if unable to obtain prior to antibiotics being given     Status: Abnormal   Collection Time: 09/29/15  6:08 PM  Result Value Ref Range Status   Specimen Description BLOOD RIGHT HAND  Final   Special Requests IN PEDIATRIC BOTTLE 2CC  Final   Culture  Setup Time   Final    GRAM POSITIVE COCCI IN CLUSTERS IN PEDIATRIC BOTTLE CRITICAL RESULT CALLED TO, READ BACK BY AND VERIFIED WITH: J. MARKLE, PHARM AT 2057 ON [id number] BY Rhea Bleacher    Culture (A)  Final    STAPHYLOCOCCUS SPECIES (COAGULASE NEGATIVE) THE SIGNIFICANCE OF ISOLATING THIS ORGANISM FROM A SINGLE VENIPUNCTURE CANNOT BE PREDICTED WITHOUT FURTHER CLINICAL AND CULTURE CORRELATION. SUSCEPTIBILITIES AVAILABLE ONLY ON REQUEST.    Report Status 10/02/2015 FINAL  Final     Scheduled Meds: . apixaban  5 mg Oral BID  . arformoterol  15 mcg Nebulization BID  . aspirin  81 mg Oral Daily  . atorvastatin  20 mg Oral q1800  . budesonide (PULMICORT) nebulizer solution  0.25 mg Nebulization BID  . ceFEPime (MAXIPIME) IV  1 g Intravenous Q8H  . clopidogrel  75 mg Oral Q breakfast  .  clotrimazole  1 application Topical BID  . diltiazem  120 mg Oral Daily  . diltiazem  120 mg Oral Once  . diphenhydrAMINE  25 mg Oral Daily  . famotidine  20 mg Oral Daily  . fluticasone  2 spray Each Nare Daily  . furosemide  40 mg Oral Daily  . guaiFENesin  1,200 mg Oral BID  . insulin aspart  0-5 Units Subcutaneous QHS  . insulin aspart  0-9 Units Subcutaneous TID WC  . insulin aspart  5 Units Subcutaneous TID WC  . insulin glargine  20 Units Subcutaneous BH-q7a  . metoprolol  100 mg Oral BID  . nicotine  21 mg Transdermal Daily  . predniSONE  40 mg Oral QAC breakfast  . ramipril  10 mg Oral Daily  . sodium chloride flush  3 mL Intravenous Q12H   Continuous Infusions: . nitroGLYCERIN Stopped (09/26/15 1315)    Procedures/Studies: Dg Chest 2 View  Result Date: 09/29/2015 CLINICAL DATA:  Hypoxia.  Shortness of breath. EXAM: CHEST  2 VIEW COMPARISON:  09/26/2015 FINDINGS: Consolidation noted in the left lingula and left lower lobe compatible with pneumonia. No focal opacity on the right. Mild hyperinflation/COPD. Heart is borderline in size. No visible effusions. No acute bony abnormality. IMPRESSION: Hyperinflation/COPD. Lingular and left lower lobe pneumonia. Electronically Signed   By: Rolm Baptise M.D.   On: 09/29/2015 15:14   Dg Chest 2 View  Result Date:  09/24/2015 CLINICAL DATA:  Shortness of breath. Chest pressure. Bilateral leg swelling for 1 week. Patient reports chronic cough. EXAM: CHEST  2 VIEW COMPARISON:  Chest radiograph 04/09/2005 FINDINGS: Heart at the upper limits normal in size. There is tortuosity and atherosclerosis of the thoracic aorta. The lungs are hyperinflated with small bilateral pleural effusions, left greater than right. Associated bibasilar opacities are likely atelectasis. Minimal vascular congestion without alveolar edema. No focal consolidation. No pneumothorax. No acute osseous abnormalities are seen. There is degenerative change in the spine. IMPRESSION: 1. Borderline cardiomegaly with small pleural effusions and mild vascular congestion, suggesting mild fluid overload/CHF. 2. Hyperinflation suggests emphysema. Electronically Signed   By: Jeb Levering M.D.   On: 09/24/2015 21:24   Dg Chest Port 1 View  Result Date: 09/26/2015 CLINICAL DATA:  Post heart catheterization.  Cough. EXAM: PORTABLE CHEST 1 VIEW COMPARISON:  September 24, 2015 FINDINGS: Mild cardiomegaly. The hila and mediastinum are unremarkable. No pulmonary nodules, masses, or focal infiltrates. No overt pulmonary edema. IMPRESSION: No active disease. Electronically Signed   By: Dorise Bullion III M.D   On: 09/26/2015 15:07    Natan Hartog, DO  [facility] Pager [phone]  If 7PM-7AM, please contact night-coverage [email or web address] Password [email or web address] 10/02/2015, 3:30 PM   LOS: 6 days

## 2015-10-02 NOTE — Progress Notes (Signed)
Pharmacy Antibiotic Note  Jason Fisher is a 75 y.o. male admitted on 09/25/2015 with pneumonia.  Pharmacy has been consulted for Vancomycin / Cefepime dosing. Pt is afebrile, WBC slightly elevated, and renal function improving. CoNS grew in 1/2 blood cultures and vancomycin can be stopped. Will increase cefepime frequency due to improved renal function.  Plan: Increase cefepime to 1g q8h D/c vanc Monitor culture data, renal function and clinical course  Height: 5\' 8"  (172.7 cm) Weight: 199 lb 6.4 oz (90.4 kg) (scale a) IBW/kg (Calculated) : 68.4  Temp (24hrs), Avg:97.8 F (36.6 C), Min:97.4 F (36.3 C), Max:98.6 F (37 C)   Recent Labs Lab 09/27/15 0154 09/28/15 0248 09/29/15 0212 09/30/15 0516 10/01/15 1101 10/02/15 0311  WBC 9.4  --  15.5* 9.4 10.6* 11.0*  CREATININE 1.40* 1.66* 1.34* 1.13 1.22 1.09    Estimated Creatinine Clearance: 64.9 mL/min (by C-G formula based on SCr of 1.09 mg/dL).    Allergies  Allergen Reactions  . Contrast Media [Iodinated Diagnostic Agents] Anaphylaxis     [name]. [name], PharmD, BCPS Clinical Pharmacist Pager [phone] 10/02/2015 11:01 AM

## 2015-10-02 NOTE — Discharge Summary (Signed)
Physician Discharge Summary  Jason Fisher [id number] DOB: November 16, 1940 DOA: 09/25/2015  PCP: Nyoka Cowden, MD  Admit date: 09/25/2015 Discharge date: 10/03/2015 Admitted From: Home Disposition:  Home  Recommendations for Outpatient Follow-up:  1. Follow up with PCP in 1-2 weeks 2. Please obtain BMP/CBC in one week   Home Health:no Equipment/Devices:none  Discharge Condition:stable CODE STATUS:FULL Diet recommendation: Heart Healthy  Brief/Interim Summary: 75 y.o.malewith medical history significant of hypertension, hyperlipidemia, diabetes mellitus, CAD, MI, s/p of stent, Afib on Eliquis, AAA, CKD-III, whopresents with a shortness of breath, chest pain and leg edema.  Patient reports that he has been having intermittent chest pain for about 2 month. The chest pain is located in the front chest, pressure-like, nonradiating, 5 out of 10 in severity. No tenderness over calf areas. Patient has cough with clear mucus production. In the past 2 days, he noted bilateral leg edema which is new for patient. No fever or chills. Patient denies nausea, vomiting, abdominal pain, symptoms of UTI, diarrhea, unilateral weakness.  pt was found to have BNP 155.5, negative troponin, WBC 6.6, temperature normal, stable renal function. Chest x-ray showed vascular congestion consistent with CHF. Patient was admitted placed on IV diuretics for diuresis with some clinical improvement. Cardiology consultation was obtained and patient underwent cardiac catheterization on 09/26/2015 which showed a new 90% stenosis in the second OM status post stenting with bio Freet on stent. Patient developed an allergic reaction to the contrast dye with hives and bronchospasms was transferred to the stepdown unit placed on IV Solu-Medrol, IV Pepcid and Benadryl with clinical improvement.  On 09/29/15, pt developed hypoxia.  Chest x-ray done was concerning for lingular left lower lobe pneumonia. Patient was  started empirically on IV vancomycin and IV cefepime. Preliminary blood cultures with gram-positive cocci in clusters/coagulase negative staph.  Discharge Diagnoses:  acute on chronic combined systolic and diastolic heart failure -noted on admission to have some orthopnea with lower extremity edema and shortness of breath.  -Patient with clinical improvement.  -Strict I's and O--NEG 6.6 L  -NEG 8 lbs for the admssion  -Continue Lopressor, Lipitor, Cardizem, ramipril, Plavix, aspirin, Eliquis. Continue oral Lasix per cardiology.  -discontinue Actos on discharge. Cardiology following and appreciate input and recommendations.  unstable angina/3 vessel coronary artery disease status post stenting to new 90% stenosis second OM 09/26/2015 -chest pain ongoing for the past 2 months worse on exertion with associated shortness of breath and concern for angina. Patient with history of coronary artery disease with cath in 2009 with DES placed to RCA for in-stent restenosis -clinical improvement after diureses and stent placement.  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  -2-D echo with EF of 55-60% with no wall motion abnormalities.  -Continue Lopressor, Lipitor, Cardizem, ramipril, eliquis, ASA, plavix, oral Lasix. -continue dual antiplatelet therapy times one month and then discontinue aspirin and continue plavix.  -Cardiology following and appreciate input and recommendations.  allergic reaction to contrast dye - allergic reaction to contrast dye during cardiac catheterization on 09/26/2015 with hives and bronchospasms.  -Patient was placed on nebulizer treatments, IV steroids, IV Pepcid, Benadryl with  clinical improvement.  -currently 96-98 % on room air with no increased work of breathing.  -  Patient had 5 days of diphenhydramine and famotidine - Changeprednisone 40 mg daily and tapered off. -d/C scheduled nebulizers, Pulmicort and Brovana.  acute respiratory failure with hypoxia secondary to healthcare associated pneumonia -hypoxic with sats of 86% on room air on 09/29/2015.  -Chest x-ray done consistent with a lingular and left lower lobe pneumonia.  -noted to have a leukocytosis although also on steroids.  -Leukocytosis is trending down. Clinically improved -Discontinue vancomycin -D/C cefepime D#4-->start oral levofloxacin--had 5 days total abx-last dose on 10/03/15  Bacteremia -contaminant -d/c vanco  hypertension -Continue Lopressor, ramipril, Cardizem. Follow.   atrial fibrillation CHA2DS2VASC Score 5. -continue Lopressor and Cardizem.  -increased diltiazem CD to 240 mg daily for better rate control -Patient was on IV heparin precardiac catheterization. Eliquis has been resumed.   chronic kidney disease stage III Baseline creatinine 1.2-1.5.   hyperlipidemia LDL was 91-- 08/30/2015 Continue Lipitor.  diabetes mellitus type 2 -Hemoglobin A1c 6.7 on 09/25/2015.   -Hold oral hypoglycemic agents.  -Increased Lantus 22units daily. Increase novolog to 7 units tiw during the hospitalization--CBGs elevated due to steroids -change to moderate SSI -Sliding scale insulin. Follow CBGs with steroid taper.  -On discharge will discontinue Actos secondary to CHF. -resume amaryl and victoza after d/c  tobacco abuse Tobacco cessation. Nicotine patch.   Discharge Instructions  Discharge Instructions    AMB Referral to Cardiac Rehabilitation - Phase II    Complete by:  As directed   Diagnosis:  Coronary Stents   AMB Referral to [facility] Management    Complete by:  As directed   Please assign to [facility] for transition of care and CHF disease and symptom  management. Written consent obtained. History of HLD, HTN, MI, Afib, DM as well. Please contact wife, [name] for post toc calls at [phone]. Please call with questions. Thank you. Marthenia Rolling, [place], RN,BSN-[facility] Liaison-[phone]   Reason for consult:  Please assign to [facility] RNCM   Diagnoses of:   Heart Failure Diabetes     Expected date of contact:  1-3 days (reserved for hospital discharges)   Amb Referral to Cardiac Rehabilitation    Complete by:  As directed   Diagnosis:  Coronary Stents   Diet - low sodium heart healthy    Complete by:  As directed   Increase activity slowly    Complete by:  As directed       Medication List    STOP taking these medications   clotrimazole 1 % cream Commonly known as:  LOTRIMIN   HYDROcodone-acetaminophen 5-325 MG tablet Commonly known as:  NORCO/VICODIN   pioglitazone 45 MG tablet Commonly known as:  ACTOS   sildenafil 100 MG tablet Commonly known as:  VIAGRA     TAKE these medications   ACCU-CHEK AVIVA PLUS test strip Generic drug:  glucose blood CHECK BLOOD SUGAR ONCE DAILY AS NEEDED   accu-chek multiclix lancets Once daily 250.00   apixaban 5 MG Tabs tablet Commonly known as:  ELIQUIS Take 1 tablet (5 mg total) by mouth 2 (two) times daily.   aspirin 81 MG chewable tablet Chew 1 tablet (81 mg total) by mouth daily. X 30 days   atorvastatin 20 MG tablet Commonly known as:  LIPITOR TAKE 1 TABLET BY MOUTH EVERY DAY   clopidogrel 75 MG tablet Commonly known as:  PLAVIX TAKE 1 TABLET BY MOUTH EVERY DAY   diltiazem 240 MG 24 hr capsule Commonly known as:  CARDIZEM CD Take 1 capsule (240  mg total) by mouth daily. Start taking on:  10/04/2015 What changed:  See the new instructions.   furosemide 40 MG tablet Commonly known as:  LASIX Take 1 tablet (40 mg total) by mouth daily.   glimepiride 4 MG tablet Commonly known as:  AMARYL TAKE 1 TABLET BY MOUTH EVERY MORNING BEFORE  BREAKFAST   Insulin Pen Needle 29G X 12.7MM Misc 1 each by Does not apply route 1 day or 1 dose.   metFORMIN 1000 MG tablet Commonly known as:  GLUCOPHAGE TAKE 1 TABLET TWICE DAILY WITH A MEAL   metoprolol 100 MG tablet Commonly known as:  LOPRESSOR Take 1 tablet (100 mg total) by mouth 2 (two) times daily.   NITROSTAT 0.4 MG SL tablet Generic drug:  nitroGLYCERIN PLACE 1 TABLET UNDER TONGUE EVERY 5 MINUTES AS NEEDED What changed:  See the new instructions.   polyethylene glycol powder powder Commonly known as:  GLYCOLAX/MIRALAX Take 17 g by mouth 2 (two) times daily as needed. What changed:  reasons to take this   ramipril 10 MG capsule Commonly known as:  ALTACE TAKE ONE CAPSULE BY MOUTH EVERY DAY   VICTOZA 18 MG/3ML Sopn Generic drug:  Liraglutide INJECT 0.3MLS INTO SKIN DAILY      Follow-up Information    [facility]   Why:  They will do your home health care at your home Contact information: [place] [address] [phone]        Nyoka Cowden, MD Follow up on 10/15/2015.   Specialty:  Internal Medicine Why:  At 11:15am for hospital follow up appt.  Contact information: [place] [address] [phone]        Peter Martinique, MD Follow up in 1 week(s).   Specialty:  Cardiology Contact information: [address] [place] [address] [phone]          Allergies  Allergen Reactions  . Contrast Media [Iodinated Diagnostic Agents] Anaphylaxis    Consultations:  cardiology   Procedures/Studies: Dg Chest 2 View  Result Date: 09/29/2015 CLINICAL DATA:  Hypoxia.  Shortness of breath. EXAM: CHEST  2 VIEW COMPARISON:  09/26/2015 FINDINGS: Consolidation noted in the left lingula and left lower lobe compatible with pneumonia. No focal opacity on the right. Mild hyperinflation/COPD. Heart is borderline in size. No visible effusions. No acute bony  abnormality. IMPRESSION: Hyperinflation/COPD. Lingular and left lower lobe pneumonia. Electronically Signed   By: Rolm Baptise M.D.   On: 09/29/2015 15:14   Dg Chest 2 View  Result Date: 09/24/2015 CLINICAL DATA:  Shortness of breath. Chest pressure. Bilateral leg swelling for 1 week. Patient reports chronic cough. EXAM: CHEST  2 VIEW COMPARISON:  Chest radiograph 04/09/2005 FINDINGS: Heart at the upper limits normal in size. There is tortuosity and atherosclerosis of the thoracic aorta. The lungs are hyperinflated with small bilateral pleural effusions, left greater than right. Associated bibasilar opacities are likely atelectasis. Minimal vascular congestion without alveolar edema. No focal consolidation. No pneumothorax. No acute osseous abnormalities are seen. There is degenerative change in the spine. IMPRESSION: 1. Borderline cardiomegaly with small pleural effusions and mild vascular congestion, suggesting mild fluid overload/CHF. 2. Hyperinflation suggests emphysema. Electronically Signed   By: Jeb Levering M.D.   On: 09/24/2015 21:24   Dg Chest Port 1 View  Result Date: 09/26/2015 CLINICAL DATA:  Post heart catheterization.  Cough. EXAM: PORTABLE CHEST 1 VIEW COMPARISON:  September 24, 2015 FINDINGS: Mild cardiomegaly. The hila and mediastinum  are unremarkable. No pulmonary nodules, masses, or focal infiltrates. No overt pulmonary edema. IMPRESSION: No active disease. Electronically Signed   By: Dorise Bullion III M.D   On: 09/26/2015 15:07        Discharge Exam: Vitals:   10/02/15 1942 10/03/15 0448  BP: (!) 147/85 (!) 142/91  Pulse: (!) 101 89  Resp: 18 18  Temp: 97.9 F (36.6 C) 97.5 F (36.4 C)   Vitals:   10/02/15 0727 10/02/15 1634 10/02/15 1942 10/03/15 0448  BP:  (!) 134/91 (!) 147/85 (!) 142/91  Pulse:  81 (!) 101 89  Resp:  18 18 18   Temp:  97.8 F (36.6 C) 97.9 F (36.6 C) 97.5 F (36.4 C)  TempSrc:  Oral Oral Oral  SpO2: 97% 95% 95% 95%  Weight:    90.2 kg  (198 lb 14.4 oz)  Height:        General: Pt is alert, awake, not in acute distress Cardiovascular: RRR, S1/S2 +, no rubs, no gallops Respiratory: CTA bilaterally, no wheezing, no rhonchi Abdominal: Soft, NT, ND, bowel sounds + Extremities: no edema, no cyanosis   The results of significant diagnostics from this hospitalization (including imaging, microbiology, ancillary and laboratory) are listed below for reference.    Significant Diagnostic Studies: Dg Chest 2 View  Result Date: 09/29/2015 CLINICAL DATA:  Hypoxia.  Shortness of breath. EXAM: CHEST  2 VIEW COMPARISON:  09/26/2015 FINDINGS: Consolidation noted in the left lingula and left lower lobe compatible with pneumonia. No focal opacity on the right. Mild hyperinflation/COPD. Heart is borderline in size. No visible effusions. No acute bony abnormality. IMPRESSION: Hyperinflation/COPD. Lingular and left lower lobe pneumonia. Electronically Signed   By: Rolm Baptise M.D.   On: 09/29/2015 15:14   Dg Chest 2 View  Result Date: 09/24/2015 CLINICAL DATA:  Shortness of breath. Chest pressure. Bilateral leg swelling for 1 week. Patient reports chronic cough. EXAM: CHEST  2 VIEW COMPARISON:  Chest radiograph 04/09/2005 FINDINGS: Heart at the upper limits normal in size. There is tortuosity and atherosclerosis of the thoracic aorta. The lungs are hyperinflated with small bilateral pleural effusions, left greater than right. Associated bibasilar opacities are likely atelectasis. Minimal vascular congestion without alveolar edema. No focal consolidation. No pneumothorax. No acute osseous abnormalities are seen. There is degenerative change in the spine. IMPRESSION: 1. Borderline cardiomegaly with small pleural effusions and mild vascular congestion, suggesting mild fluid overload/CHF. 2. Hyperinflation suggests emphysema. Electronically Signed   By: Jeb Levering M.D.   On: 09/24/2015 21:24   Dg Chest Port 1 View  Result Date:  09/26/2015 CLINICAL DATA:  Post heart catheterization.  Cough. EXAM: PORTABLE CHEST 1 VIEW COMPARISON:  September 24, 2015 FINDINGS: Mild cardiomegaly. The hila and mediastinum are unremarkable. No pulmonary nodules, masses, or focal infiltrates. No overt pulmonary edema. IMPRESSION: No active disease. Electronically Signed   By: Dorise Bullion III M.D   On: 09/26/2015 15:07     Microbiology: Recent Results (from the past 240 hour(s))  MRSA PCR Screening     Status: None   Collection Time: 09/26/15  1:30 PM  Result Value Ref Range Status   MRSA by PCR NEGATIVE NEGATIVE Final    Comment:        The GeneXpert MRSA Assay (FDA approved for NASAL specimens only), is one component of a comprehensive MRSA colonization surveillance program. It is not intended to diagnose MRSA infection nor to guide or monitor treatment for MRSA infections.   Culture, blood (routine x 2)  Call MD if unable to obtain prior to antibiotics being given     Status: None (Preliminary result)   Collection Time: 09/29/15  6:00 PM  Result Value Ref Range Status   Specimen Description BLOOD LEFT HAND  Final   Special Requests BOTTLES DRAWN AEROBIC AND ANAEROBIC 10CC  Final   Culture NO GROWTH 3 DAYS  Final   Report Status PENDING  Incomplete  Culture, blood (routine x 2) Call MD if unable to obtain prior to antibiotics being given     Status: Abnormal   Collection Time: 09/29/15  6:08 PM  Result Value Ref Range Status   Specimen Description BLOOD RIGHT HAND  Final   Special Requests IN PEDIATRIC BOTTLE 2CC  Final   Culture  Setup Time   Final    GRAM POSITIVE COCCI IN CLUSTERS IN PEDIATRIC BOTTLE CRITICAL RESULT CALLED TO, READ BACK BY AND VERIFIED WITH: J. MARKLE, PHARM AT 2057 ON [id number] BY Rhea Bleacher    Culture (A)  Final    STAPHYLOCOCCUS SPECIES (COAGULASE NEGATIVE) THE SIGNIFICANCE OF ISOLATING THIS ORGANISM FROM A SINGLE VENIPUNCTURE CANNOT BE PREDICTED WITHOUT FURTHER CLINICAL AND CULTURE CORRELATION.  SUSCEPTIBILITIES AVAILABLE ONLY ON REQUEST.    Report Status 10/02/2015 FINAL  Final     Labs: Basic Metabolic Panel:  Recent Labs Lab 09/27/15 0154 09/28/15 0248 09/29/15 0212 09/30/15 0516 10/01/15 1101 10/02/15 0311  NA 140 141 140 140 138 139  K 3.0* 3.8 3.7 3.6 4.2 3.6  CL 99* 103 104 103 100* 105  CO2 29 29 28 29 27 25   GLUCOSE 171* 190* 194* 181* 334* 143*  BUN 20 35* 36* 30* 27* 27*  CREATININE 1.40* 1.66* 1.34* 1.13 1.22 1.09  CALCIUM 9.2 9.3 9.0 8.7* 8.8* 8.7*  MG 2.1  --   --   --  2.4  --    Liver Function Tests: No results for input(s): AST, ALT, ALKPHOS, BILITOT, PROT, ALBUMIN in the last 168 hours. No results for input(s): LIPASE, AMYLASE in the last 168 hours. No results for input(s): AMMONIA in the last 168 hours. CBC:  Recent Labs Lab 09/27/15 0154 09/29/15 0212 09/30/15 0516 10/01/15 1101 10/02/15 0311  WBC 9.4 15.5* 9.4 10.6* 11.0*  HGB 13.8 13.5 13.5 13.8 12.8*  HCT 42.9 40.8 41.7 42.7 39.7  MCV 89.6 88.9 90.3 90.3 89.2  PLT 171 182 146* 170 152   Cardiac Enzymes:  Recent Labs Lab 09/27/15 0154  TROPONINI 0.06*   BNP: Invalid input(s): POCBNP CBG:  Recent Labs Lab 10/02/15 0555 10/02/15 1152 10/02/15 1630 10/02/15 2057 10/03/15 0655  GLUCAP 133* 241* 245* 274* 107*    Time coordinating discharge:  Greater than 30 minutes  Signed:  Barry Culverhouse, DO [facility] Pager: [phone] 10/03/2015, 11:18 AM

## 2015-10-02 NOTE — Progress Notes (Signed)
CARDIAC REHAB PHASE I   PRE:  Rate/Rhythm: 71 afib    BP: sitting 127/85    SaO2: 96 RA  MODE:  Ambulation: 460 ft   POST:  Rate/Rhythm: 96 afib    BP: sitting 132/80     SaO2: 96 RA  Pt tolerated well. Wants to d/c. Feels well. Reviewed ed. [id number]   [place], ACSM 10/02/2015 3:04 PM

## 2015-10-02 NOTE — Progress Notes (Signed)
Pt slept well overnight, no any complaints of chest pain, SOB and distress noted. IV ABX is continue, Oxygen sat is maintained at 95% on RA, will continue to monitor the patient.

## 2015-10-03 DIAGNOSIS — E1151 Type 2 diabetes mellitus with diabetic peripheral angiopathy without gangrene: Secondary | ICD-10-CM

## 2015-10-03 LAB — GLUCOSE, CAPILLARY
GLUCOSE-CAPILLARY: 107 mg/dL — AB (ref 65–99)
GLUCOSE-CAPILLARY: 213 mg/dL — AB (ref 65–99)

## 2015-10-03 MED ORDER — DILTIAZEM HCL ER COATED BEADS 240 MG PO CP24: 240.0000 mg | ORAL_CAPSULE | Freq: Every day | ORAL | Status: DC

## 2015-10-03 MED ORDER — DILTIAZEM HCL ER COATED BEADS 120 MG PO CP24
120.0000 mg | ORAL_CAPSULE | Freq: Once | ORAL | Status: AC
  Administered 2015-10-03: 120 mg via ORAL
  Filled 2015-10-03: qty 1

## 2015-10-03 MED ORDER — DILTIAZEM HCL ER COATED BEADS 240 MG PO CP24: 240.0000 mg | ORAL_CAPSULE | Freq: Every day | ORAL | 1 refills | Status: DC

## 2015-10-03 MED ORDER — LEVOFLOXACIN 750 MG PO TABS
750.0000 mg | ORAL_TABLET | Freq: Every day | ORAL | Status: DC
  Administered 2015-10-03: 750 mg via ORAL
  Filled 2015-10-03: qty 1

## 2015-10-03 NOTE — Care Management Important Message (Signed)
Important Message  Patient Details  Name: Jason Fisher MRN: [id number] Date of Birth: 12/09/1940   Medicare Important Message Given:  Yes    [name] 10/03/2015, 10:00 AM

## 2015-10-03 NOTE — Progress Notes (Signed)
Pharmacy Antibiotic Note  Jason Fisher is a 75 y.o. male admitted on 09/25/2015 with pneumonia.  Pharmacy has been consulted for Vancomycin / Cefepime dosing. Pt is afebrile, WBC slightly elevated, and renal function improving. CoNS grew in 1/2 blood cultures and vancomycin can be stopped. Will increase cefepime frequency due to improved renal function.  Cefepime was switched to oral levaquin yesterday. Based on renal function and indication, will increase dose today.  Plan: Increase levaquin to 750mg  q24h Monitor culture data, renal function and clinical course  Height: 5\' 8"  (172.7 cm) Weight: 198 lb 14.4 oz (90.2 kg) (scale a) IBW/kg (Calculated) : 68.4  Temp (24hrs), Avg:97.7 F (36.5 C), Min:97.5 F (36.4 C), Max:97.9 F (36.6 C)   Recent Labs Lab 09/27/15 0154 09/28/15 0248 09/29/15 0212 09/30/15 0516 10/01/15 1101 10/02/15 0311  WBC 9.4  --  15.5* 9.4 10.6* 11.0*  CREATININE 1.40* 1.66* 1.34* 1.13 1.22 1.09    Estimated Creatinine Clearance: 64.8 mL/min (by C-G formula based on SCr of 1.09 mg/dL).    Allergies  Allergen Reactions  . Contrast Media [Iodinated Diagnostic Agents] Anaphylaxis     [name]. [name], PharmD, [place] Clinical Pharmacist Pager [phone] 10/03/2015 9:43 AM

## 2015-10-03 NOTE — Progress Notes (Signed)
Pt given dose of Levaquin prior to discharge, as requested per MD.

## 2015-10-03 NOTE — Progress Notes (Signed)
Orders received for pt discharge.  Discharge summary printed and reviewed with pt.  Explained medication regimen, and pt had no further questions at this time.  IV removed and site remains clean, dry, intact.  Telemetry removed.  Pt in stable condition and awaiting transport. 

## 2015-10-04 DIAGNOSIS — E1151 Type 2 diabetes mellitus with diabetic peripheral angiopathy without gangrene: Secondary | ICD-10-CM | POA: Diagnosis not present

## 2015-10-04 DIAGNOSIS — I130 Hypertensive heart and chronic kidney disease with heart failure and stage 1 through stage 4 chronic kidney disease, or unspecified chronic kidney disease: Secondary | ICD-10-CM | POA: Diagnosis not present

## 2015-10-04 DIAGNOSIS — Z720 Tobacco use: Secondary | ICD-10-CM | POA: Diagnosis not present

## 2015-10-04 DIAGNOSIS — E785 Hyperlipidemia, unspecified: Secondary | ICD-10-CM | POA: Diagnosis not present

## 2015-10-04 DIAGNOSIS — E1122 Type 2 diabetes mellitus with diabetic chronic kidney disease: Secondary | ICD-10-CM | POA: Diagnosis not present

## 2015-10-04 DIAGNOSIS — I2510 Atherosclerotic heart disease of native coronary artery without angina pectoris: Secondary | ICD-10-CM | POA: Diagnosis not present

## 2015-10-04 DIAGNOSIS — I5033 Acute on chronic diastolic (congestive) heart failure: Secondary | ICD-10-CM | POA: Diagnosis not present

## 2015-10-04 DIAGNOSIS — N183 Chronic kidney disease, stage 3 (moderate): Secondary | ICD-10-CM | POA: Diagnosis not present

## 2015-10-04 DIAGNOSIS — I714 Abdominal aortic aneurysm, without rupture: Secondary | ICD-10-CM | POA: Diagnosis not present

## 2015-10-04 DIAGNOSIS — I480 Paroxysmal atrial fibrillation: Secondary | ICD-10-CM | POA: Diagnosis not present

## 2015-10-04 LAB — CULTURE, BLOOD (ROUTINE X 2): Culture: NO GROWTH

## 2015-10-08 ENCOUNTER — Other Ambulatory Visit: Payer: Self-pay | Admitting: *Deleted

## 2015-10-08 DIAGNOSIS — N183 Chronic kidney disease, stage 3 (moderate): Secondary | ICD-10-CM | POA: Diagnosis not present

## 2015-10-08 DIAGNOSIS — I5033 Acute on chronic diastolic (congestive) heart failure: Secondary | ICD-10-CM | POA: Diagnosis not present

## 2015-10-08 DIAGNOSIS — I714 Abdominal aortic aneurysm, without rupture: Secondary | ICD-10-CM | POA: Diagnosis not present

## 2015-10-08 DIAGNOSIS — I480 Paroxysmal atrial fibrillation: Secondary | ICD-10-CM | POA: Diagnosis not present

## 2015-10-08 DIAGNOSIS — E1151 Type 2 diabetes mellitus with diabetic peripheral angiopathy without gangrene: Secondary | ICD-10-CM | POA: Diagnosis not present

## 2015-10-08 DIAGNOSIS — Z720 Tobacco use: Secondary | ICD-10-CM | POA: Diagnosis not present

## 2015-10-08 DIAGNOSIS — E785 Hyperlipidemia, unspecified: Secondary | ICD-10-CM | POA: Diagnosis not present

## 2015-10-08 DIAGNOSIS — I130 Hypertensive heart and chronic kidney disease with heart failure and stage 1 through stage 4 chronic kidney disease, or unspecified chronic kidney disease: Secondary | ICD-10-CM | POA: Diagnosis not present

## 2015-10-08 DIAGNOSIS — E1122 Type 2 diabetes mellitus with diabetic chronic kidney disease: Secondary | ICD-10-CM | POA: Diagnosis not present

## 2015-10-08 DIAGNOSIS — I2510 Atherosclerotic heart disease of native coronary artery without angina pectoris: Secondary | ICD-10-CM | POA: Diagnosis not present

## 2015-10-08 NOTE — Patient Outreach (Signed)
Transition of care call attempt #1 unsuccessful. I left a message requesting a return call. If I don't hear back from Mr. Jason Fisher, I will call him again tomorrow.  Deloria Lair [facility] [place] [phone]

## 2015-10-09 ENCOUNTER — Other Ambulatory Visit: Payer: Self-pay | Admitting: *Deleted

## 2015-10-09 DIAGNOSIS — I714 Abdominal aortic aneurysm, without rupture: Secondary | ICD-10-CM | POA: Diagnosis not present

## 2015-10-09 DIAGNOSIS — Z720 Tobacco use: Secondary | ICD-10-CM | POA: Diagnosis not present

## 2015-10-09 DIAGNOSIS — I480 Paroxysmal atrial fibrillation: Secondary | ICD-10-CM | POA: Diagnosis not present

## 2015-10-09 DIAGNOSIS — E1151 Type 2 diabetes mellitus with diabetic peripheral angiopathy without gangrene: Secondary | ICD-10-CM | POA: Diagnosis not present

## 2015-10-09 DIAGNOSIS — I130 Hypertensive heart and chronic kidney disease with heart failure and stage 1 through stage 4 chronic kidney disease, or unspecified chronic kidney disease: Secondary | ICD-10-CM | POA: Diagnosis not present

## 2015-10-09 DIAGNOSIS — E785 Hyperlipidemia, unspecified: Secondary | ICD-10-CM | POA: Diagnosis not present

## 2015-10-09 DIAGNOSIS — N183 Chronic kidney disease, stage 3 (moderate): Secondary | ICD-10-CM | POA: Diagnosis not present

## 2015-10-09 DIAGNOSIS — I5033 Acute on chronic diastolic (congestive) heart failure: Secondary | ICD-10-CM | POA: Diagnosis not present

## 2015-10-09 DIAGNOSIS — E1122 Type 2 diabetes mellitus with diabetic chronic kidney disease: Secondary | ICD-10-CM | POA: Diagnosis not present

## 2015-10-09 DIAGNOSIS — I2510 Atherosclerotic heart disease of native coronary artery without angina pectoris: Secondary | ICD-10-CM | POA: Diagnosis not present

## 2015-10-09 NOTE — Patient Outreach (Signed)
Transition of care call #2 attempt. I was not able to reach anyone at the residence on either listed phone number.I left a message on each line that I would like a call back.  Deloria Lair [facility] [place] [phone]

## 2015-10-10 ENCOUNTER — Other Ambulatory Visit: Payer: Self-pay | Admitting: *Deleted

## 2015-10-10 DIAGNOSIS — I2510 Atherosclerotic heart disease of native coronary artery without angina pectoris: Secondary | ICD-10-CM | POA: Diagnosis not present

## 2015-10-10 DIAGNOSIS — I5033 Acute on chronic diastolic (congestive) heart failure: Secondary | ICD-10-CM | POA: Diagnosis not present

## 2015-10-10 DIAGNOSIS — E785 Hyperlipidemia, unspecified: Secondary | ICD-10-CM | POA: Diagnosis not present

## 2015-10-10 DIAGNOSIS — N183 Chronic kidney disease, stage 3 (moderate): Secondary | ICD-10-CM | POA: Diagnosis not present

## 2015-10-10 DIAGNOSIS — Z720 Tobacco use: Secondary | ICD-10-CM | POA: Diagnosis not present

## 2015-10-10 DIAGNOSIS — I714 Abdominal aortic aneurysm, without rupture: Secondary | ICD-10-CM | POA: Diagnosis not present

## 2015-10-10 DIAGNOSIS — I480 Paroxysmal atrial fibrillation: Secondary | ICD-10-CM | POA: Diagnosis not present

## 2015-10-10 DIAGNOSIS — E1122 Type 2 diabetes mellitus with diabetic chronic kidney disease: Secondary | ICD-10-CM | POA: Diagnosis not present

## 2015-10-10 DIAGNOSIS — E1151 Type 2 diabetes mellitus with diabetic peripheral angiopathy without gangrene: Secondary | ICD-10-CM | POA: Diagnosis not present

## 2015-10-10 DIAGNOSIS — I130 Hypertensive heart and chronic kidney disease with heart failure and stage 1 through stage 4 chronic kidney disease, or unspecified chronic kidney disease: Secondary | ICD-10-CM | POA: Diagnosis not present

## 2015-10-10 NOTE — Patient Outreach (Signed)
Transition of care call 3rd attempt, no answer, left another message.  I have called both numbers listed for Mr. Jason Fisher and Mrs. Jason Fisher. I have also called Dr. Burnice Logan to advise I have not been able to reach the pt since he was discharged from the hospital. I spoke to [facility] and advised her of this and she double checked phone numbers with me, which are correct and up to date. She says he has an appt with Dr. Burnice Logan next week on 10/15/15. I have asked her to please put a note in the chart that I would like to be involved with this pt for [place]. Perhaps, Dr. Raliegh Ip can leverage this opportunity while he is evaluating the patient. Please call me if the pt agrees to a home visit. My cell # is listed below.  Deloria Lair [facility] [place] [phone]

## 2015-10-11 ENCOUNTER — Encounter: Payer: Self-pay | Admitting: *Deleted

## 2015-10-11 ENCOUNTER — Other Ambulatory Visit: Payer: Self-pay | Admitting: *Deleted

## 2015-10-11 NOTE — Patient Outreach (Signed)
Jason Fisher called me back today. She said her husbands phone is being used by their son now that's why I couldn't get through to him.  I advised Jason Fisher that I had tried to call 3 times and because I couldn't reach them I had called Dr. Truddie Hidden office and advised them that I hadn't been able to contact them and to please tell the pt that [place] services are available and to encourage him to participate. I informed her I have also requested a letter be sent out, unsuccessful contact letter, and to expect it but to disregard it now that we have established a relationship.   Jason Fisher kindly answered all the initial assessment questions for me. I have advised her that I will take a back seat to the [place] over the time that they are involved, but, I will call weekly to check on him following our Transition of Care Program protocol. I have encouraged her to feel free to call me if any problems arise. I am available to them for consultation.   I will call them next Friday.  Patient was recently discharged from hospital and all medications have been reviewed. Current Meds  Medication Sig  . ACCU-CHEK AVIVA PLUS test strip CHECK BLOOD SUGAR ONCE DAILY AS NEEDED  . apixaban (ELIQUIS) 5 MG TABS tablet Take 1 tablet (5 mg total) by mouth 2 (two) times daily.  Marland Kitchen aspirin 81 MG chewable tablet Chew 1 tablet (81 mg total) by mouth daily. X 30 days  . atorvastatin (LIPITOR) 20 MG tablet TAKE 1 TABLET BY MOUTH EVERY DAY  . clopidogrel (PLAVIX) 75 MG tablet TAKE 1 TABLET BY MOUTH EVERY DAY  . diltiazem (CARDIZEM CD) 240 MG 24 hr capsule Take 1 capsule (240 mg total) by mouth daily.  . furosemide (LASIX) 40 MG tablet Take 1 tablet (40 mg total) by mouth daily.  Marland Kitchen glimepiride (AMARYL) 4 MG tablet TAKE 1 TABLET BY MOUTH EVERY MORNING BEFORE BREAKFAST  . Insulin Pen Needle 29G X 12.7MM MISC 1 each by Does not apply route 1 day or 1 dose.  . Lancets (ACCU-CHEK MULTICLIX)  lancets Once daily 250.00  . metFORMIN (GLUCOPHAGE) 1000 MG tablet TAKE 1 TABLET TWICE DAILY WITH A MEAL  . metoprolol (LOPRESSOR) 100 MG tablet Take 1 tablet (100 mg total) by mouth 2 (two) times daily.  . polyethylene glycol powder (GLYCOLAX/MIRALAX) powder Take 17 g by mouth 2 (two) times daily as needed. (Patient taking differently: Take 17 g by mouth 2 (two) times daily as needed for mild constipation. )  . ramipril (ALTACE) 10 MG capsule TAKE ONE CAPSULE BY MOUTH EVERY DAY  . VICTOZA 18 MG/3ML SOPN INJECT 0.3MLS INTO SKIN DAILY    THN CM Care Plan Problem One   Flowsheet Row Most Recent Value  Care Plan Problem One  New diagnosis of CHF.  Role Documenting the Problem One  Care Management [place] for Problem One  Active  THN Long Term Goal (31-90 days)  Pt will not readmit due to HF over the next 31 days.  THN Long Term Goal Start Date  10/11/15  Interventions for Problem One Long Term Goal  Verified if pt has CHF information (yes) and varified daily monitoring started (yes).  THN CM Short Term Goal #1 (0-30 days)  Pt will verbalize knowledge of disease process, pathophysiology at the end of 30 days.  THN CM Short Term Goal #1 Start Date  10/11/15  Interventions  for Short Term Goal #1  Verified pt has HF information and encouraged to review this.  THN CM Short Term Goal #2 (0-30 days)  Pt will be able to discuss which meds he takes for HF and what they do at the end of 30 days.  THN CM Short Term Goal #2 Start Date  10/11/15  Interventions for Short Term Goal #2  Medication reconciliation done, pt has all meds.     Fall Risk  10/11/2015 10/11/2015 04/22/2015 03/07/2014 10/31/2013  Falls in the past year? No Yes No No No  Number falls in past yr: (No Data) - - - -   Depression screen [facility] 2/9 10/11/2015 04/22/2015 03/07/2014 10/31/2013 10/07/2012  Decreased Interest 0 0 0 0 0  Down, Depressed, Hopeless 0 0 0 0 0  PHQ - 2 Score 0 0 0 0 0   Deloria Lair [facility] [place] [phone]

## 2015-10-14 DIAGNOSIS — E1122 Type 2 diabetes mellitus with diabetic chronic kidney disease: Secondary | ICD-10-CM | POA: Diagnosis not present

## 2015-10-14 DIAGNOSIS — Z720 Tobacco use: Secondary | ICD-10-CM | POA: Diagnosis not present

## 2015-10-14 DIAGNOSIS — I5033 Acute on chronic diastolic (congestive) heart failure: Secondary | ICD-10-CM | POA: Diagnosis not present

## 2015-10-14 DIAGNOSIS — N183 Chronic kidney disease, stage 3 (moderate): Secondary | ICD-10-CM | POA: Diagnosis not present

## 2015-10-14 DIAGNOSIS — I480 Paroxysmal atrial fibrillation: Secondary | ICD-10-CM | POA: Diagnosis not present

## 2015-10-14 DIAGNOSIS — E785 Hyperlipidemia, unspecified: Secondary | ICD-10-CM | POA: Diagnosis not present

## 2015-10-14 DIAGNOSIS — I130 Hypertensive heart and chronic kidney disease with heart failure and stage 1 through stage 4 chronic kidney disease, or unspecified chronic kidney disease: Secondary | ICD-10-CM | POA: Diagnosis not present

## 2015-10-14 DIAGNOSIS — I2510 Atherosclerotic heart disease of native coronary artery without angina pectoris: Secondary | ICD-10-CM | POA: Diagnosis not present

## 2015-10-14 DIAGNOSIS — E1151 Type 2 diabetes mellitus with diabetic peripheral angiopathy without gangrene: Secondary | ICD-10-CM | POA: Diagnosis not present

## 2015-10-14 DIAGNOSIS — I714 Abdominal aortic aneurysm, without rupture: Secondary | ICD-10-CM | POA: Diagnosis not present

## 2015-10-15 ENCOUNTER — Encounter: Payer: Self-pay | Admitting: Internal Medicine

## 2015-10-15 ENCOUNTER — Ambulatory Visit (INDEPENDENT_AMBULATORY_CARE_PROVIDER_SITE_OTHER): Payer: Medicare Other | Admitting: Internal Medicine

## 2015-10-15 VITALS — BP 130/80 | HR 74 | Resp 20 | Ht 68.0 in | Wt 194.2 lb

## 2015-10-15 DIAGNOSIS — I2510 Atherosclerotic heart disease of native coronary artery without angina pectoris: Secondary | ICD-10-CM | POA: Diagnosis not present

## 2015-10-15 DIAGNOSIS — E1151 Type 2 diabetes mellitus with diabetic peripheral angiopathy without gangrene: Secondary | ICD-10-CM

## 2015-10-15 DIAGNOSIS — I482 Chronic atrial fibrillation, unspecified: Secondary | ICD-10-CM

## 2015-10-15 DIAGNOSIS — Z8719 Personal history of other diseases of the digestive system: Secondary | ICD-10-CM

## 2015-10-15 DIAGNOSIS — Z9861 Coronary angioplasty status: Secondary | ICD-10-CM | POA: Diagnosis not present

## 2015-10-15 DIAGNOSIS — I5043 Acute on chronic combined systolic (congestive) and diastolic (congestive) heart failure: Secondary | ICD-10-CM

## 2015-10-15 LAB — CBC WITH DIFFERENTIAL/PLATELET
BASOS ABS: 0 10*3/uL (ref 0.0–0.1)
BASOS PCT: 0.2 % (ref 0.0–3.0)
EOS ABS: 0.2 10*3/uL (ref 0.0–0.7)
Eosinophils Relative: 2.1 % (ref 0.0–5.0)
HEMATOCRIT: 38.3 % — AB (ref 39.0–52.0)
HEMOGLOBIN: 13 g/dL (ref 13.0–17.0)
LYMPHS PCT: 22 % (ref 12.0–46.0)
Lymphs Abs: 2.4 10*3/uL (ref 0.7–4.0)
MCHC: 33.8 g/dL (ref 30.0–36.0)
MCV: 87 fl (ref 78.0–100.0)
Monocytes Absolute: 0.7 10*3/uL (ref 0.1–1.0)
Monocytes Relative: 5.9 % (ref 3.0–12.0)
Neutro Abs: 7.7 10*3/uL (ref 1.4–7.7)
Neutrophils Relative %: 69.8 % (ref 43.0–77.0)
Platelets: 265 10*3/uL (ref 150.0–400.0)
RBC: 4.4 Mil/uL (ref 4.22–5.81)
RDW: 14.7 % (ref 11.5–15.5)
WBC: 11 10*3/uL — AB (ref 4.0–10.5)

## 2015-10-15 LAB — BASIC METABOLIC PANEL
BUN: 21 mg/dL (ref 6–23)
CHLORIDE: 103 meq/L (ref 96–112)
CO2: 33 mEq/L — ABNORMAL HIGH (ref 19–32)
Calcium: 9 mg/dL (ref 8.4–10.5)
Creatinine, Ser: 1.31 mg/dL (ref 0.40–1.50)
GFR: 56.71 mL/min — ABNORMAL LOW (ref >60.00)
GLUCOSE: 116 mg/dL — AB (ref 70–99)
POTASSIUM: 4.2 meq/L (ref 3.5–5.1)
Sodium: 143 mEq/L (ref 135–145)

## 2015-10-15 MED ORDER — GLIMEPIRIDE 4 MG PO TABS: 2.0000 mg | ORAL_TABLET | Freq: Every day | ORAL | 3 refills | Status: DC

## 2015-10-15 NOTE — Progress Notes (Signed)
Subjective:    Patient ID: Jason Fisher, male    DOB: July 19, 1940, 75 y.o.   MRN: [id number]  HPI  75 year old patient who is seen today following a recent hospital discharge. He presented to the emergency room with unstable angina with fluid overload and atrial fibrillation. He underwent cardiac catheterization and is status post stenting.  He is now on DAPT as well as anticoagulation with Eliquis. He states that he has had some black bowel movements since his discharge, but these have normalized.  He generally feels well He is followed by home health care.  His weight remains stable at 149.  Apparently peaked at 211.  During his hospital. Denies any angina  Due to congestive heart failure.  Actos has been discontinued.  Hemoglobin A1c was well controlled.  Blood sugars have been quite stable with a low of 83  He is scheduled for cardiology follow-up on September 21  Hospital records reviewed  Past Medical History:  Diagnosis Date  . 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 BENIGN PROSTATIC HYPERTROPHY, HX OF 02/24/2007  . CAD S/P PCI 11/01/2005   BMS 2 PCI to RCA in 2007, then in 2009 for ISR with DES stent to proximal Stent ISR and proximal RCA  . 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 CHF (congestive heart failure) ([place])   . Chronic atrial fibrillation ([place]) 11/06/2009   Anticoagulated with warfarin. Rate control with beta blocker and calcium channel blocker. CHA2DS2Vasc 5  . CKD (chronic kidney disease), stage III   . COLONIC POLYPS, HX OF 11/02/2006  . DIABETES MELLITUS, TYPE II 10/29/2006  . Diverticulosis   . DYSPNEA ON EXERTION 07/07/2008  . HYPERLIPIDEMIA 10/29/2006  . HYPERTENSION 10/29/2006  . Inferior STEMI, history of 11/01/2005   Occluded RCA: Treated with BMS PCI with 2 overlapping Vision stents  . TOBACCO USER 04/17/2010     Social History   Social History  . Marital status: Married    Spouse name: N/A  . Number of children: 5  . Years of education: N/A    Occupational History  . [employer]    Social History Main Topics  . Smoking status: Former Smoker    Types: Cigarettes  . Smokeless tobacco: Never Used     Comment: passive smoker as well  . Alcohol use No  . Drug use: No  . Sexual activity: Not on file   Other Topics Concern  . Not on file   Social History Narrative  . No narrative on file    Past Surgical History:  Procedure Laterality Date  . CARDIAC CATHETERIZATION N/A 09/26/2015   Procedure: Left Heart Cath and Coronary Angiography;  Surgeon: Peter M Martinique, MD;  Location: [facility];  Service: Cardiovascular;  Laterality: N/A;  . CARDIAC CATHETERIZATION N/A 09/26/2015   Procedure: Coronary Stent Intervention;  Surgeon: Peter M Martinique, MD;  Location: [place] [facility];  Service: Cardiovascular;  Laterality: N/A;  . CATARACT EXTRACTION    . COLONOSCOPY    . CORONARY ANGIOPLASTY WITH STENT PLACEMENT  2007   Inferior STEMI 2007 - RCA PCI with 2 Vision BMS; Abnormal Myoview in 2009 - pRCA ins-stent restenosis & unstented disease - DES PCI Promus 3.0 x 20 mm (3.5 mm)  . SHOULDER SURGERY Right     Family History  Problem Relation Age of Onset  . Heart attack Mother   . Diabetes Mother   . Heart attack Father   . Hypertension Father   . Heart  attack Brother   . Diabetes Brother     Allergies  Allergen Reactions  . Contrast Media [Iodinated Diagnostic Agents] Anaphylaxis    Current Outpatient Prescriptions on File Prior to Visit  Medication Sig Dispense Refill  . ACCU-CHEK AVIVA PLUS test strip CHECK BLOOD SUGAR ONCE DAILY AS NEEDED 100 each 4  . apixaban (ELIQUIS) 5 MG TABS tablet Take 1 tablet (5 mg total) by mouth 2 (two) times daily. 60 tablet 6  . aspirin 81 MG chewable tablet Chew 1 tablet (81 mg total) by mouth daily. X 30 days 30 tablet 0  . atorvastatin (LIPITOR) 20 MG tablet TAKE 1 TABLET BY MOUTH EVERY DAY 90 tablet 1  . clopidogrel (PLAVIX) 75 MG tablet TAKE 1 TABLET BY MOUTH EVERY DAY 90  tablet 3  . diltiazem (CARDIZEM CD) 240 MG 24 hr capsule Take 1 capsule (240 mg total) by mouth daily. 30 capsule 1  . furosemide (LASIX) 40 MG tablet Take 1 tablet (40 mg total) by mouth daily. 30 tablet 1  . glimepiride (AMARYL) 4 MG tablet TAKE 1 TABLET BY MOUTH EVERY MORNING BEFORE BREAKFAST 90 tablet 3  . Insulin Pen Needle 29G X 12.7MM MISC 1 each by Does not apply route 1 day or 1 dose. 100 each 3  . Lancets (ACCU-CHEK MULTICLIX) lancets Once daily 250.00 100 each 3  . metFORMIN (GLUCOPHAGE) 1000 MG tablet TAKE 1 TABLET TWICE DAILY WITH A MEAL 180 tablet 1  . metoprolol (LOPRESSOR) 100 MG tablet Take 1 tablet (100 mg total) by mouth 2 (two) times daily. 180 tablet 1  . NITROSTAT 0.4 MG SL tablet PLACE 1 TABLET UNDER TONGUE EVERY 5 MINUTES AS NEEDED 25 tablet 0  . polyethylene glycol powder (GLYCOLAX/MIRALAX) powder Take 17 g by mouth 2 (two) times daily as needed. (Patient taking differently: Take 17 g by mouth 2 (two) times daily as needed for mild constipation. ) 3350 g 1  . ramipril (ALTACE) 10 MG capsule TAKE ONE CAPSULE BY MOUTH EVERY DAY 90 capsule 1  . VICTOZA 18 MG/3ML SOPN INJECT 0.3MLS INTO SKIN DAILY 9 pen 1   No current facility-administered medications on file prior to visit.     BP 130/80 (BP Location: Right Arm, Patient Position: Sitting, Cuff Size: Normal)   Pulse 74   Resp 20   Ht 5\' 8"  (1.727 m)   Wt 194 lb 4 oz (88.1 kg)   SpO2 96%   BMI 29.54 kg/m     Review of Systems  Constitutional: Positive for fatigue and unexpected weight change. Negative for appetite change, chills and fever.  HENT: Negative for congestion, dental problem, ear pain, hearing loss, sore throat, tinnitus, trouble swallowing and voice change.   Eyes: Negative for pain, discharge and visual disturbance.  Respiratory: Positive for shortness of breath. Negative for cough, chest tightness, wheezing and stridor.   Cardiovascular: Positive for chest pain. Negative for palpitations and leg  swelling.  Gastrointestinal: Negative for abdominal distention, abdominal pain, blood in stool, constipation, diarrhea, nausea and vomiting.  Genitourinary: Negative for difficulty urinating, discharge, flank pain, genital sores, hematuria and urgency.  Musculoskeletal: Negative for arthralgias, back pain, gait problem, joint swelling, myalgias and neck stiffness.  Skin: Negative for rash.  Neurological: Negative for dizziness, syncope, speech difficulty, weakness, numbness and headaches.  Hematological: Negative for adenopathy. Does not bruise/bleed easily.  Psychiatric/Behavioral: Negative for behavioral problems and dysphoric mood. The patient is not nervous/anxious.        Objective:   Physical Exam  Constitutional: He is oriented to person, place, and time. He appears well-developed.  Weight 194  Blood pressure 124/80  HENT:  Head: Normocephalic.  Right Ear: External ear normal.  Left Ear: External ear normal.  Eyes: Conjunctivae and EOM are normal.  Neck: Normal range of motion.  Cardiovascular: Normal rate and normal heart sounds.   Rhythm, slightly irregular with a controlled ventricular response  Pulmonary/Chest: Breath sounds normal. No respiratory distress. He has no wheezes.  Abdominal: Bowel sounds are normal.  Musculoskeletal: Normal range of motion. He exhibits no edema or tenderness.  Neurological: He is alert and oriented to person, place, and time.  Psychiatric: He has a normal mood and affect. His behavior is normal.          Assessment & Plan:   Coronary artery disease status post angioplasty/stenting.  Continue DAPT.  Cardiology follow-up as scheduled Atrial fibrillation.  Continue anticoagulation. Diabetes mellitus.  Decrease glimepiride to 2 mg daily.  Continue to monitor home blood sugar readings Diastolic heart failure.  Appears to be euvolemic.  Check basic metabolic profile History of black stool.  Check CBC.  Check stool for occult blood times  4   Cardiology follow-up as scheduled Continue restricted salt diet Continue monitoring of weights.  Will notify of any significant weight gain  Follow-up 2 months  [id number],PETER Pilar Plate

## 2015-10-15 NOTE — Patient Instructions (Signed)
Limit your sodium (Salt) intake  Return slides to check stool for hidden blood  Cardiology follow-up as scheduled  Decrease glimepiride to 2 mg daily (1 half of 4 mg tablet)  Notify of any significant weight gain of greater than 4 pounds

## 2015-10-15 NOTE — Progress Notes (Signed)
Pre visit review using our clinic review tool, if applicable. No additional management support is needed unless otherwise documented below in the visit note. 

## 2015-10-16 DIAGNOSIS — I130 Hypertensive heart and chronic kidney disease with heart failure and stage 1 through stage 4 chronic kidney disease, or unspecified chronic kidney disease: Secondary | ICD-10-CM | POA: Diagnosis not present

## 2015-10-16 DIAGNOSIS — E1122 Type 2 diabetes mellitus with diabetic chronic kidney disease: Secondary | ICD-10-CM | POA: Diagnosis not present

## 2015-10-16 DIAGNOSIS — I2510 Atherosclerotic heart disease of native coronary artery without angina pectoris: Secondary | ICD-10-CM | POA: Diagnosis not present

## 2015-10-16 DIAGNOSIS — Z720 Tobacco use: Secondary | ICD-10-CM | POA: Diagnosis not present

## 2015-10-16 DIAGNOSIS — E1151 Type 2 diabetes mellitus with diabetic peripheral angiopathy without gangrene: Secondary | ICD-10-CM | POA: Diagnosis not present

## 2015-10-16 DIAGNOSIS — I480 Paroxysmal atrial fibrillation: Secondary | ICD-10-CM | POA: Diagnosis not present

## 2015-10-16 DIAGNOSIS — I5033 Acute on chronic diastolic (congestive) heart failure: Secondary | ICD-10-CM | POA: Diagnosis not present

## 2015-10-16 DIAGNOSIS — E785 Hyperlipidemia, unspecified: Secondary | ICD-10-CM | POA: Diagnosis not present

## 2015-10-16 DIAGNOSIS — I714 Abdominal aortic aneurysm, without rupture: Secondary | ICD-10-CM | POA: Diagnosis not present

## 2015-10-16 DIAGNOSIS — N183 Chronic kidney disease, stage 3 (moderate): Secondary | ICD-10-CM | POA: Diagnosis not present

## 2015-10-18 ENCOUNTER — Other Ambulatory Visit: Payer: Self-pay | Admitting: *Deleted

## 2015-10-18 NOTE — Patient Outreach (Signed)
Mrs. Radde returned my call and reports her husband is doing well. He saw his primary care provider this week and got a good report. She says his glimipiride was reduced to 2.5 mg daily and that was the only change in his regimen. They are on their way to a family reunion in [place] today. I reminded Mrs. Hoage that I will be calling weekly through the middle of October to check on Mr. Speyer. She also told me that the PT had their last meeting with him and that the home health nurse will be coming out one more time. I reminded her that if they have any questions or problems they can call on me and that I am able to make a home visit during my office hours: Monday-Friday 8:30 - 5:00.  Deloria Lair [facility] [place] [phone]

## 2015-10-18 NOTE — Patient Outreach (Signed)
Transition of care call attempted. I left a message on the mobile phone number and spoke briefly with pts son who said his father and mother were on their way to a family reunion. He offered that his father seems to be doing some better. I advised him I had left a message on the mobile and will wait to hear back from Mr. Or Mrs. Frumkin for a weekly update.  Deloria Lair [facility] [place] [phone]

## 2015-10-24 ENCOUNTER — Ambulatory Visit (INDEPENDENT_AMBULATORY_CARE_PROVIDER_SITE_OTHER): Payer: Medicare Other | Admitting: Nurse Practitioner

## 2015-10-24 ENCOUNTER — Encounter: Payer: Self-pay | Admitting: *Deleted

## 2015-10-24 ENCOUNTER — Encounter: Payer: Self-pay | Admitting: Nurse Practitioner

## 2015-10-24 VITALS — BP 142/87 | HR 87 | Ht 69.0 in | Wt 197.4 lb

## 2015-10-24 DIAGNOSIS — E785 Hyperlipidemia, unspecified: Secondary | ICD-10-CM | POA: Diagnosis not present

## 2015-10-24 DIAGNOSIS — I110 Hypertensive heart disease with heart failure: Secondary | ICD-10-CM | POA: Diagnosis not present

## 2015-10-24 DIAGNOSIS — I25110 Atherosclerotic heart disease of native coronary artery with unstable angina pectoris: Secondary | ICD-10-CM | POA: Diagnosis not present

## 2015-10-24 DIAGNOSIS — Z720 Tobacco use: Secondary | ICD-10-CM | POA: Diagnosis not present

## 2015-10-24 DIAGNOSIS — Z006 Encounter for examination for normal comparison and control in clinical research program: Secondary | ICD-10-CM

## 2015-10-24 NOTE — Patient Instructions (Signed)
Your physician recommends that you schedule a follow-up appointment in: 3 months with Dr Martinique, Rhonda Barrett, or Kerin Ransom.  No changes were made today in your therapy.

## 2015-10-24 NOTE — Progress Notes (Signed)
Office Visit    Patient Name: Jason Fisher Date of Encounter: 10/24/2015  Primary Care Provider:  Nyoka Cowden, MD Primary Cardiologist:  P. Martinique, MD   Chief Complaint    75 year old male with prior history of CAD, chronic atrial fibrillation, stage III kidney disease, hypertension, hyperlipidemia, diabetes, and tobacco abuse who was recently admitted with diastolic failure and underwent stenting of the second obtuse marginal with development of pneumonia post PCI. He presents for follow-up today.  Past Medical History    Past Medical History:  Diagnosis Date  . 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 BENIGN PROSTATIC HYPERTROPHY, HX OF 02/24/2007  . CAD S/P PCI    a. 2007 Inf STEMI s/p Vision BMS  2 to RCA;  b. 2009 ISR Prox RCA Stent-->with DES;  c. 09/2015 PCI: LM nl, LAD 30p/m, D1 90, LCX nl, OM1 small, 80, OM2 90 (2.5x14 Biofreedom stent), OM3 small, RCA 30p ISR, 69m ISR, 46m, 85d, EF 50-55%.  . 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 Chronic atrial fibrillation ([place]) 11/06/2009   a. On Eliquis (CHA2DS2VASc = 5).  Rate controlled w/ BB and CCB.  Marland Kitchen Chronic diastolic CHF (congestive heart failure) ([place])    a. 09/2015 Echo: EF 55-60%, mildly dil LA.  . CKD (chronic kidney disease), stage III   . COLONIC POLYPS, HX OF 11/02/2006  . DIABETES MELLITUS, TYPE II 10/29/2006  . Diverticulosis   . History of tobacco abuse    a. Quit 09/2015.  Marland Kitchen HYPERLIPIDEMIA 10/29/2006  . Hypertensive heart disease with heart failure [facility])    Past Surgical History:  Procedure Laterality Date  . CARDIAC CATHETERIZATION N/A 09/26/2015   Procedure: Left Heart Cath and Coronary Angiography;  Surgeon: Peter M Martinique, MD;  Location: [facility];  Service: Cardiovascular;  Laterality: N/A;  . CARDIAC CATHETERIZATION N/A 09/26/2015   Procedure: Coronary Stent Intervention;  Surgeon: Peter M Martinique, MD;  Location: [facility];  Service: Cardiovascular;  Laterality: N/A;  . CATARACT  EXTRACTION    . COLONOSCOPY    . CORONARY ANGIOPLASTY WITH STENT PLACEMENT  2007   Inferior STEMI 2007 - RCA PCI with 2 Vision BMS; Abnormal Myoview in 2009 - pRCA ins-stent restenosis & unstented disease - DES PCI Promus 3.0 x 20 mm (3.5 mm)  . SHOULDER SURGERY Right     Allergies  Allergies  Allergen Reactions  . Contrast Media [Iodinated Diagnostic Agents] Anaphylaxis    History of Present Illness    75 year old male developed [place] past medical history including coronary artery disease status post inferior STEMI in 2007 requiring bare metal stenting 2 with subsequent drug-eluting stent placement secondary to in-stent restenosis in 2009. He also has a history of hypertension, hyperlipidemia, diabetes, stage III kidney disease, and tobacco abuse. He was recently admitted to [facility] with dyspnea and chest pain. He was volume overloaded and diuresed. He had only mild troponin elevation. Subsequently underwent diagnostic catheterization revealing severe disease in the second obtuse marginal with otherwise moderate disease throughout. He had only minimal in-stent restenosis with prior RCA stents. LV function was normal. The obtuse marginal successfully stented with a bio freedom stent since he is on chronic eliquis. Post catheterization, he developed eliquis. Blood cultures are positive but this was felt to be secondary to contamination. He has since finished his course of antibiotics.  Since his discharge, he has done quite well. He has not been having any chest pain or dyspnea. He has not smoked since  his hospitalization. He denies PND, orthopnea, dizziness, sink P, edema, or early satiety. He is walking some with his wife and does plan to enroll in cardiac rehabilitation. He is aware that he is to stop aspirin on September 24.  Home Medications    Prior to Admission medications   Medication Sig Start Date End Date Taking? Authorizing Provider  ACCU-CHEK AVIVA PLUS test strip CHECK  BLOOD SUGAR ONCE DAILY AS NEEDED 03/27/15  Yes Marletta Lor, MD  apixaban (ELIQUIS) 5 MG TABS tablet Take 1 tablet (5 mg total) by mouth 2 (two) times daily. 01/21/15  Yes Marletta Lor, MD  aspirin 81 MG chewable tablet Chew 1 tablet (81 mg total) by mouth daily. X 30 days 10/02/15  Yes Orson Eva, MD  atorvastatin (LIPITOR) 20 MG tablet TAKE 1 TABLET BY MOUTH EVERY DAY 03/12/15  Yes Marletta Lor, MD  clopidogrel (PLAVIX) 75 MG tablet TAKE 1 TABLET BY MOUTH EVERY DAY 09/23/15  Yes Marletta Lor, MD  diltiazem (CARDIZEM CD) 240 MG 24 hr capsule Take 1 capsule (240 mg total) by mouth daily. 10/04/15  Yes Orson Eva, MD  furosemide (LASIX) 40 MG tablet Take 1 tablet (40 mg total) by mouth daily. 10/02/15  Yes Orson Eva, MD  glimepiride (AMARYL) 4 MG tablet Take 0.5 tablets (2 mg total) by mouth daily with breakfast. 10/15/15  Yes Marletta Lor, MD  Insulin Pen Needle 29G X 12.7MM MISC 1 each by Does not apply route 1 day or 1 dose. 01/06/12  Yes Marletta Lor, MD  Lancets (ACCU-CHEK MULTICLIX) lancets Once daily 250.00 01/09/13  Yes Marletta Lor, MD  metFORMIN (GLUCOPHAGE) 1000 MG tablet TAKE 1 TABLET TWICE DAILY WITH A MEAL 07/27/14  Yes Marletta Lor, MD  metoprolol (LOPRESSOR) 100 MG tablet Take 1 tablet (100 mg total) by mouth 2 (two) times daily. 03/07/14  Yes Marletta Lor, MD  NITROSTAT 0.4 MG SL tablet PLACE 1 TABLET UNDER TONGUE EVERY 5 MINUTES AS NEEDED 11/29/13  Yes Marletta Lor, MD  polyethylene glycol powder (GLYCOLAX/MIRALAX) powder Take 17 g by mouth 2 (two) times daily as needed. Patient taking differently: Take 17 g by mouth 2 (two) times daily as needed for mild constipation.  09/06/15  Yes Marletta Lor, MD  ramipril (ALTACE) 10 MG capsule TAKE ONE CAPSULE BY MOUTH EVERY DAY 03/12/15  Yes Marletta Lor, MD  VICTOZA 18 MG/3ML SOPN INJECT 0.3MLS INTO SKIN DAILY 08/29/15  Yes Marletta Lor, MD    Review of Systems    As  above, he has been doing well without chest pain or dyspnea. He further denies PND, orthopnea, dizziness, syncope, palpitations, edema, or early satiety. All other systems reviewed and are otherwise negative except as noted above.  Physical Exam    VS:  BP (!) 142/87   Pulse 87   Ht 5\' 9"  (1.753 m)   Wt 197 lb 6.4 oz (89.5 kg)   BMI 29.15 kg/m  , BMI Body mass index is 29.15 kg/m. Repeat blood pressure 118/80.  GEN: Well nourished, well developed, in no acute distress.  HEENT: normal.  Neck: Supple, no JVD, carotid bruits, or masses. Cardiac: RRR, no murmurs, rubs, or gallops. No clubbing, cyanosis, edema.  Radials/DP/PT 2+ and equal bilaterally. The right wrist catheterization site is without bleeding, bruit, or hematoma. Respiratory:  Respirations regular and unlabored. Diminished breath sounds bilaterally. GI: Soft, nontender, nondistended, BS + x 4. MS: no deformity or atrophy. Skin:  warm and dry, no rash. Neuro:  Strength and sensation are intact. Psych: Normal affect.  Accessory Clinical Findings    ECG - Atrial fibrillation, 87, nonspecific ST changes. No acute changes.  Assessment & Plan    1.  Coronary artery disease: Status post prior RCA stenting in 2007 with repeat stenting in 2009 and more recently stenting of the second obtuse marginal in late August. He has done well since his hospitalization without chest pain or dyspnea. He has quit smoking. He is tolerating his medications including aspirin, statin, Plavix, beta blocker, and ACE inhibitor therapy. As he had a bioFreedom stent placed, he will come off of aspirin on September 24 as previously recommended. He will remain on eliquis and Plavix. He does plan in an rolling in cardiac rehabilitation.  2. Chronic atrial fibrillation: This is well rate controlled on diltiazem and metoprolol. He is anticoagulated with eliquis.  3. Hypertensive heart disease: The pressure was initially elevated at 142/87 however on repeat, he  was 118/80. He remains on his blocker, calcium channel blocker, and ACE inhibitor therapy.  4. Hyperlipidemia: LDL 77. He is on Lipitor 20 chronically.  5. Tobacco abuse: He has not smoked since his hospitalization. I have congratulated him and recommended that he remain off of cigarettes going forward. He is motivated to stay off of cigarettes.  6. Stage III chronic kidney disease: Creatinine was relatively stable throughout hospitalization and on follow-up earlier this month. He remains on ACE inhibitor therapy.  7. DM II:  On victoza and managed by primary care.  A1c was 6.7 on 8/23.  8.  Disposition: Patient will follow up in 3 months or sooner if necessary.  Murray Hodgkins, NP 10/24/2015, 10:05 AM

## 2015-10-24 NOTE — Progress Notes (Signed)
[facility] month 1 office visit completed. Patient was instructed to STOP ASA on 10/27/15. (Per [facility] Protocol and Dr. Doug Sou Cath note). Patient verbalized understanding. EKG obtained and patient states he has been angina free. Questions encouraged and answered.

## 2015-10-25 ENCOUNTER — Other Ambulatory Visit: Payer: Self-pay | Admitting: *Deleted

## 2015-10-25 NOTE — Patient Outreach (Signed)
Transition of care call. [name] reports her husband is doing very well. His wt is stable at 192, he is not having any SOB or edema. He has had his follow up with cardiology and was told he is doing well.  I reminded [name] that I am available for any questions or problems. I reinforced to continue following the HF Action Plan.  I will call again next Tuesday.  Jason Fisher [facility] [place] [phone]

## 2015-10-28 ENCOUNTER — Other Ambulatory Visit: Payer: Self-pay | Admitting: *Deleted

## 2015-10-29 ENCOUNTER — Ambulatory Visit: Payer: Self-pay | Admitting: *Deleted

## 2015-11-04 ENCOUNTER — Encounter (HOSPITAL_COMMUNITY): Payer: Self-pay | Admitting: Emergency Medicine

## 2015-11-04 ENCOUNTER — Emergency Department (HOSPITAL_COMMUNITY): Payer: Medicare Other

## 2015-11-04 ENCOUNTER — Inpatient Hospital Stay (HOSPITAL_COMMUNITY)
Admission: EM | Admit: 2015-11-04 | Discharge: 2015-11-08 | DRG: 348 | Disposition: A | Discharge status: Home Health | Payer: Medicare Other | Attending: Internal Medicine | Admitting: Internal Medicine

## 2015-11-04 DIAGNOSIS — I25810 Atherosclerosis of coronary artery bypass graft(s) without angina pectoris: Secondary | ICD-10-CM | POA: Diagnosis not present

## 2015-11-04 DIAGNOSIS — R6510 Systemic inflammatory response syndrome (SIRS) of non-infectious origin without acute organ dysfunction: Secondary | ICD-10-CM | POA: Diagnosis not present

## 2015-11-04 DIAGNOSIS — K645 Perianal venous thrombosis: Secondary | ICD-10-CM | POA: Diagnosis not present

## 2015-11-04 DIAGNOSIS — I714 Abdominal aortic aneurysm, without rupture, unspecified: Secondary | ICD-10-CM

## 2015-11-04 DIAGNOSIS — Z7902 Long term (current) use of antithrombotics/antiplatelets: Secondary | ICD-10-CM

## 2015-11-04 DIAGNOSIS — Z86010 Personal history of colonic polyps: Secondary | ICD-10-CM | POA: Diagnosis not present

## 2015-11-04 DIAGNOSIS — R918 Other nonspecific abnormal finding of lung field: Secondary | ICD-10-CM

## 2015-11-04 DIAGNOSIS — Z833 Family history of diabetes mellitus: Secondary | ICD-10-CM

## 2015-11-04 DIAGNOSIS — Z8249 Family history of ischemic heart disease and other diseases of the circulatory system: Secondary | ICD-10-CM | POA: Diagnosis not present

## 2015-11-04 DIAGNOSIS — E1122 Type 2 diabetes mellitus with diabetic chronic kidney disease: Secondary | ICD-10-CM | POA: Diagnosis present

## 2015-11-04 DIAGNOSIS — Z955 Presence of coronary angioplasty implant and graft: Secondary | ICD-10-CM

## 2015-11-04 DIAGNOSIS — I480 Paroxysmal atrial fibrillation: Secondary | ICD-10-CM | POA: Diagnosis not present

## 2015-11-04 DIAGNOSIS — R911 Solitary pulmonary nodule: Secondary | ICD-10-CM

## 2015-11-04 DIAGNOSIS — E872 Acidosis: Secondary | ICD-10-CM | POA: Diagnosis present

## 2015-11-04 DIAGNOSIS — E118 Type 2 diabetes mellitus with unspecified complications: Secondary | ICD-10-CM

## 2015-11-04 DIAGNOSIS — I482 Chronic atrial fibrillation: Secondary | ICD-10-CM | POA: Diagnosis present

## 2015-11-04 DIAGNOSIS — K644 Residual hemorrhoidal skin tags: Secondary | ICD-10-CM | POA: Diagnosis not present

## 2015-11-04 DIAGNOSIS — I252 Old myocardial infarction: Secondary | ICD-10-CM | POA: Diagnosis not present

## 2015-11-04 DIAGNOSIS — I130 Hypertensive heart and chronic kidney disease with heart failure and stage 1 through stage 4 chronic kidney disease, or unspecified chronic kidney disease: Secondary | ICD-10-CM | POA: Diagnosis present

## 2015-11-04 DIAGNOSIS — E785 Hyperlipidemia, unspecified: Secondary | ICD-10-CM

## 2015-11-04 DIAGNOSIS — K611 Rectal abscess: Secondary | ICD-10-CM | POA: Diagnosis present

## 2015-11-04 DIAGNOSIS — Z79899 Other long term (current) drug therapy: Secondary | ICD-10-CM

## 2015-11-04 DIAGNOSIS — K6289 Other specified diseases of anus and rectum: Secondary | ICD-10-CM | POA: Diagnosis not present

## 2015-11-04 DIAGNOSIS — Z91041 Radiographic dye allergy status: Secondary | ICD-10-CM

## 2015-11-04 DIAGNOSIS — K612 Anorectal abscess: Principal | ICD-10-CM | POA: Diagnosis present

## 2015-11-04 DIAGNOSIS — N183 Chronic kidney disease, stage 3 (moderate): Secondary | ICD-10-CM | POA: Diagnosis not present

## 2015-11-04 DIAGNOSIS — I10 Essential (primary) hypertension: Secondary | ICD-10-CM

## 2015-11-04 DIAGNOSIS — I25118 Atherosclerotic heart disease of native coronary artery with other forms of angina pectoris: Secondary | ICD-10-CM | POA: Diagnosis not present

## 2015-11-04 DIAGNOSIS — I5030 Unspecified diastolic (congestive) heart failure: Secondary | ICD-10-CM | POA: Diagnosis present

## 2015-11-04 DIAGNOSIS — I5032 Chronic diastolic (congestive) heart failure: Secondary | ICD-10-CM | POA: Diagnosis not present

## 2015-11-04 DIAGNOSIS — I25708 Atherosclerosis of coronary artery bypass graft(s), unspecified, with other forms of angina pectoris: Secondary | ICD-10-CM

## 2015-11-04 LAB — CBC WITH DIFFERENTIAL/PLATELET
Basophils Absolute: 0.1 10*3/uL (ref 0.0–0.1)
Basophils Relative: 0 %
EOS PCT: 1 %
Eosinophils Absolute: 0.1 10*3/uL (ref 0.0–0.7)
HEMATOCRIT: 36.4 % — AB (ref 39.0–52.0)
HEMOGLOBIN: 11.9 g/dL — AB (ref 13.0–17.0)
LYMPHS ABS: 2.5 10*3/uL (ref 0.7–4.0)
LYMPHS PCT: 22 %
MCH: 28.9 pg (ref 26.0–34.0)
MCHC: 32.7 g/dL (ref 30.0–36.0)
MCV: 88.3 fL (ref 78.0–100.0)
Monocytes Absolute: 1.1 10*3/uL — ABNORMAL HIGH (ref 0.1–1.0)
Monocytes Relative: 9 %
NEUTROS ABS: 7.9 10*3/uL — AB (ref 1.7–7.7)
Neutrophils Relative %: 68 %
PLATELETS: 212 10*3/uL (ref 150–400)
RBC: 4.12 MIL/uL — AB (ref 4.22–5.81)
RDW: 14.6 % (ref 11.5–15.5)
WBC: 11.7 10*3/uL — AB (ref 4.0–10.5)

## 2015-11-04 LAB — COMPREHENSIVE METABOLIC PANEL
ALBUMIN: 3.6 g/dL (ref 3.5–5.0)
ALK PHOS: 66 U/L (ref 38–126)
ALT: 20 U/L (ref 17–63)
AST: 41 U/L (ref 15–41)
Anion gap: 12 (ref 5–15)
BUN: 17 mg/dL (ref 6–20)
CALCIUM: 9.1 mg/dL (ref 8.9–10.3)
CHLORIDE: 104 mmol/L (ref 101–111)
CO2: 24 mmol/L (ref 22–32)
CREATININE: 1.2 mg/dL (ref 0.61–1.24)
GFR calc Af Amer: >60 mL/min (ref >60)
GFR calc non Af Amer: 58 mL/min — ABNORMAL LOW (ref >60)
GLUCOSE: 182 mg/dL — AB (ref 65–99)
Potassium: 4.3 mmol/L (ref 3.5–5.1)
SODIUM: 140 mmol/L (ref 135–145)
Total Bilirubin: 1.5 mg/dL — ABNORMAL HIGH (ref 0.3–1.2)
Total Protein: 7 g/dL (ref 6.5–8.1)

## 2015-11-04 LAB — LACTIC ACID, PLASMA: Lactic Acid, Venous: 2.4 mmol/L (ref 0.5–1.9)

## 2015-11-04 LAB — I-STAT CG4 LACTIC ACID, ED: LACTIC ACID, VENOUS: 2.52 mmol/L — AB (ref 0.5–1.9)

## 2015-11-04 LAB — GLUCOSE, CAPILLARY
GLUCOSE-CAPILLARY: 92 mg/dL (ref 65–99)
Glucose-Capillary: 176 mg/dL — ABNORMAL HIGH (ref 65–99)

## 2015-11-04 LAB — CK: Total CK: 34 U/L — ABNORMAL LOW (ref 49–397)

## 2015-11-04 MED ORDER — HYDRALAZINE HCL 20 MG/ML IJ SOLN: 5.0000 mg | INTRAMUSCULAR | Status: DC | PRN

## 2015-11-04 MED ORDER — ACETAMINOPHEN 650 MG RE SUPP: 650.0000 mg | Freq: Four times a day (QID) | RECTAL | Status: DC | PRN

## 2015-11-04 MED ORDER — SODIUM CHLORIDE 0.9 % IV BOLUS (SEPSIS)
1000.0000 mL | Freq: Once | INTRAVENOUS | Status: AC
  Administered 2015-11-04: 1000 mL via INTRAVENOUS

## 2015-11-04 MED ORDER — DILTIAZEM HCL ER COATED BEADS 240 MG PO CP24
240.0000 mg | ORAL_CAPSULE | Freq: Every day | ORAL | Status: DC
  Administered 2015-11-05 – 2015-11-08 (×4): 240 mg via ORAL
  Filled 2015-11-04 (×4): qty 1

## 2015-11-04 MED ORDER — SODIUM CHLORIDE 0.9% FLUSH
3.0000 mL | Freq: Two times a day (BID) | INTRAVENOUS | Status: DC
  Administered 2015-11-04 – 2015-11-07 (×3): 3 mL via INTRAVENOUS

## 2015-11-04 MED ORDER — CLOPIDOGREL BISULFATE 75 MG PO TABS
75.0000 mg | ORAL_TABLET | Freq: Every day | ORAL | Status: DC
  Administered 2015-11-04 – 2015-11-08 (×5): 75 mg via ORAL
  Filled 2015-11-04 (×5): qty 1

## 2015-11-04 MED ORDER — PIPERACILLIN-TAZOBACTAM 3.375 G IVPB
3.3750 g | Freq: Four times a day (QID) | INTRAVENOUS | Status: DC
  Administered 2015-11-04: 3.375 g via INTRAVENOUS
  Filled 2015-11-04 (×4): qty 50

## 2015-11-04 MED ORDER — ONDANSETRON HCL 4 MG PO TABS: 4.0000 mg | ORAL_TABLET | Freq: Four times a day (QID) | ORAL | Status: DC | PRN

## 2015-11-04 MED ORDER — MORPHINE SULFATE (PF) 4 MG/ML IV SOLN
4.0000 mg | Freq: Once | INTRAVENOUS | Status: AC
  Administered 2015-11-04: 4 mg via INTRAVENOUS
  Filled 2015-11-04: qty 1

## 2015-11-04 MED ORDER — ATORVASTATIN CALCIUM 20 MG PO TABS
20.0000 mg | ORAL_TABLET | Freq: Every day | ORAL | Status: DC
  Administered 2015-11-04 – 2015-11-08 (×5): 20 mg via ORAL
  Filled 2015-11-04 (×5): qty 1

## 2015-11-04 MED ORDER — ONDANSETRON HCL 4 MG/2ML IJ SOLN: 4.0000 mg | Freq: Four times a day (QID) | INTRAMUSCULAR | Status: DC | PRN

## 2015-11-04 MED ORDER — SODIUM CHLORIDE 0.9 % IV SOLN: 250.0000 mL | INTRAVENOUS | Status: DC | PRN

## 2015-11-04 MED ORDER — APIXABAN 5 MG PO TABS: 5.0000 mg | ORAL_TABLET | Freq: Two times a day (BID) | ORAL | Status: DC

## 2015-11-04 MED ORDER — NITROGLYCERIN 0.3 MG SL SUBL
0.3000 mg | SUBLINGUAL_TABLET | SUBLINGUAL | Status: DC | PRN
  Filled 2015-11-04: qty 100

## 2015-11-04 MED ORDER — INSULIN ASPART 100 UNIT/ML ~~LOC~~ SOLN
0.0000 [IU] | Freq: Every day | SUBCUTANEOUS | Status: DC
  Administered 2015-11-06: 4 [IU] via SUBCUTANEOUS
  Administered 2015-11-07: 2 [IU] via SUBCUTANEOUS

## 2015-11-04 MED ORDER — ACETAMINOPHEN 325 MG PO TABS: 650.0000 mg | ORAL_TABLET | Freq: Four times a day (QID) | ORAL | Status: DC | PRN

## 2015-11-04 MED ORDER — SODIUM CHLORIDE 0.9% FLUSH: 3.0000 mL | INTRAVENOUS | Status: DC | PRN

## 2015-11-04 MED ORDER — INSULIN ASPART 100 UNIT/ML ~~LOC~~ SOLN
0.0000 [IU] | Freq: Three times a day (TID) | SUBCUTANEOUS | Status: DC
  Administered 2015-11-05 (×2): 2 [IU] via SUBCUTANEOUS
  Administered 2015-11-05: 1 [IU] via SUBCUTANEOUS
  Administered 2015-11-06: 5 [IU] via SUBCUTANEOUS
  Administered 2015-11-06 – 2015-11-07 (×2): 3 [IU] via SUBCUTANEOUS
  Administered 2015-11-07: 5 [IU] via SUBCUTANEOUS
  Administered 2015-11-07: 3 [IU] via SUBCUTANEOUS
  Administered 2015-11-08: 2 [IU] via SUBCUTANEOUS

## 2015-11-04 MED ORDER — FUROSEMIDE 40 MG PO TABS
40.0000 mg | ORAL_TABLET | Freq: Every day | ORAL | Status: DC
  Administered 2015-11-05 – 2015-11-08 (×4): 40 mg via ORAL
  Filled 2015-11-04 (×4): qty 1

## 2015-11-04 MED ORDER — PIPERACILLIN-TAZOBACTAM 3.375 G IVPB
3.3750 g | Freq: Three times a day (TID) | INTRAVENOUS | Status: DC
  Administered 2015-11-04 – 2015-11-08 (×11): 3.375 g via INTRAVENOUS
  Filled 2015-11-04 (×13): qty 50

## 2015-11-04 MED ORDER — RAMIPRIL 2.5 MG PO CAPS
10.0000 mg | ORAL_CAPSULE | Freq: Every day | ORAL | Status: DC
  Administered 2015-11-04: 10 mg via ORAL
  Filled 2015-11-04 (×2): qty 4

## 2015-11-04 MED ORDER — METOPROLOL TARTRATE 100 MG PO TABS
100.0000 mg | ORAL_TABLET | Freq: Two times a day (BID) | ORAL | Status: DC
  Administered 2015-11-04 – 2015-11-08 (×8): 100 mg via ORAL
  Filled 2015-11-04 (×8): qty 1

## 2015-11-04 MED ORDER — SODIUM CHLORIDE 0.9 % IV SOLN
INTRAVENOUS | Status: DC
  Administered 2015-11-04 – 2015-11-05 (×2): 1000 mL via INTRAVENOUS

## 2015-11-04 NOTE — Consult Note (Signed)
CARDIOLOGY CONSULT NOTE     Patient ID: Jason Fisher MRN: [id number] DOB/AGE: 75/05/1940 75 y.o.  Admit date: 11/04/2015 Referring Physician Linna Darner MD Primary Physician Nyoka Cowden, MD Primary Cardiologist Dale Strausser Martin[place] MD Reason for Consultation pre op evaluation  HPI: Mr. Fels is a 75 yo WM well known to me. He was admitted with progressive rectal pain and found to have a perirectal abscess and thrombosed hemorrhoid. Plan is to lance the abscess.   He has a prior history of CAD, chronic atrial fibrillation, stage III kidney disease, hypertension, hyperlipidemia, diabetes, and tobacco abuse who was  admitted in August with diastolic failure and underwent stenting of the second obtuse marginal with development of pneumonia post PCI.  He has a past medical history including coronary artery disease status post inferior STEMI in 2007 requiring bare metal stenting 2 with subsequent drug-eluting stent placement secondary to in-stent restenosis in 2009. He also has a history of hypertension, hyperlipidemia, diabetes, stage III kidney disease, and tobacco abuse. He was  admitted to [facility] in August  with dyspnea and chest pain. He was volume overloaded and diuresed. He had only mild troponin elevation. Subsequently underwent diagnostic catheterization revealing severe disease in the second obtuse marginal with otherwise moderate disease throughout. He had only minimal in-stent restenosis with prior RCA stents. LV function was normal. The obtuse marginal successfully stented with a Bio freedom stent since he is on chronic eliquis. Post catheterization, he developed PNA. Blood cultures were positive but this was felt to be secondary to contamination. He was treated with antibiotics with resolution. He stopped ASA after one month but remains on Eliquis and Plavix. He denies any recurrent cough, chest pain, dyspnea or abdominal pain. No fever.    Past Medical History:  Diagnosis  Date  . 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 BENIGN PROSTATIC HYPERTROPHY, HX OF 02/24/2007  . CAD S/P PCI    a. 2007 Inf STEMI s/p Vision BMS  2 to RCA;  b. 2009 ISR Prox RCA Stent-->with DES;  c. 09/2015 PCI: LM nl, LAD 30p/m, D1 90, LCX nl, OM1 small, 80, OM2 90 (2.5x14 Biofreedom stent), OM3 small, RCA 30p ISR, 22m ISR, 31m, 85d, EF 50-55%.  . 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 Chronic atrial fibrillation ([place]) 11/06/2009   a. On Eliquis (CHA2DS2VASc = 5).  Rate controlled w/ BB and CCB.  Marland Kitchen Chronic diastolic CHF (congestive heart failure) ([place])    a. 09/2015 Echo: EF 55-60%, mildly dil LA.  . CKD (chronic kidney disease), stage III   . COLONIC POLYPS, HX OF 11/02/2006  . DIABETES MELLITUS, TYPE II 10/29/2006  . Diverticulosis   . History of tobacco abuse    a. Quit 09/2015.  Marland Kitchen HYPERLIPIDEMIA 10/29/2006  . Hypertensive heart disease with heart failure (HCC)     Family History  Problem Relation Age of Onset  . Heart attack Mother   . Diabetes Mother   . Heart attack Father   . Hypertension Father   . Heart attack Brother   . Diabetes Brother     Social History   Social History  . Marital status: Married    Spouse name: N/A  . Number of children: 5  . Years of education: N/A   Occupational History  . [employer]    Social History Main Topics  . Smoking status: Former Smoker    Types: Cigarettes  . Smokeless tobacco: Never Used     Comment: passive smoker as well  .  Alcohol use No  . Drug use: No  . Sexual activity: Not on file   Other Topics Concern  . Not on file   Social History Narrative  . No narrative on file    Past Surgical History:  Procedure Laterality Date  . CARDIAC CATHETERIZATION N/A 09/26/2015   Procedure: Left Heart Cath and Coronary Angiography;  Surgeon: Roel Douthat M Martinique, MD;  Location: [place] [facility];  Service: Cardiovascular;  Laterality: N/A;  . CARDIAC CATHETERIZATION N/A 09/26/2015   Procedure: Coronary Stent Intervention;  Surgeon:  Kaizley Aja M Martinique, MD;  Location: [place] [facility];  Service: Cardiovascular;  Laterality: N/A;  . CATARACT EXTRACTION    . COLONOSCOPY    . CORONARY ANGIOPLASTY WITH STENT PLACEMENT  2007   Inferior STEMI 2007 - RCA PCI with 2 Vision BMS; Abnormal Myoview in 2009 - pRCA ins-stent restenosis & unstented disease - DES PCI Promus 3.0 x 20 mm (3.5 mm)  . SHOULDER SURGERY Right      Prescriptions Prior to Admission  Medication Sig Dispense Refill Last Dose  . apixaban (ELIQUIS) 5 MG TABS tablet Take 1 tablet (5 mg total) by mouth 2 (two) times daily. 60 tablet 6 11/03/2015 at Unknown time  . atorvastatin (LIPITOR) 20 MG tablet TAKE 1 TABLET BY MOUTH EVERY DAY 90 tablet 1 11/03/2015 at Unknown time  . clopidogrel (PLAVIX) 75 MG tablet TAKE 1 TABLET BY MOUTH EVERY DAY 90 tablet 3 11/03/2015 at Unknown time  . diltiazem (CARDIZEM CD) 240 MG 24 hr capsule Take 1 capsule (240 mg total) by mouth daily. 30 capsule 1 11/03/2015 at Unknown time  . furosemide (LASIX) 40 MG tablet Take 1 tablet (40 mg total) by mouth daily. 30 tablet 1 11/03/2015 at Unknown time  . glimepiride (AMARYL) 4 MG tablet Take 0.5 tablets (2 mg total) by mouth daily with breakfast. 90 tablet 3 11/03/2015 at Unknown time  . Liraglutide (VICTOZA) 18 MG/3ML SOPN Inject 1.8 mg into the skin daily. Takes 0.7ml's   11/03/2015 at Unknown time  . metFORMIN (GLUCOPHAGE) 1000 MG tablet TAKE 1 TABLET TWICE DAILY WITH A MEAL 180 tablet 1 11/03/2015 at Unknown time  . metoprolol (LOPRESSOR) 100 MG tablet Take 1 tablet (100 mg total) by mouth 2 (two) times daily. 180 tablet 1 11/03/2015 at 2200  . NITROSTAT 0.4 MG SL tablet PLACE 1 TABLET UNDER TONGUE EVERY 5 MINUTES AS NEEDED 25 tablet 0 prn  . polyethylene glycol powder (GLYCOLAX/MIRALAX) powder Take 17 g by mouth 2 (two) times daily as needed. (Patient taking differently: Take 17 g by mouth 2 (two) times daily as needed for mild constipation. ) 3350 g 1 Past Week at Unknown time  . ramipril (ALTACE) 10 MG  capsule TAKE ONE CAPSULE BY MOUTH EVERY DAY 90 capsule 1 11/03/2015 at Unknown time  . VICTOZA 18 MG/3ML SOPN INJECT 0.3MLS INTO SKIN DAILY 9 pen 1 11/03/2015 at Unknown time  . ACCU-CHEK AVIVA PLUS test strip CHECK BLOOD SUGAR ONCE DAILY AS NEEDED 100 each 4 Taking  . Insulin Pen Needle 29G X 12.7MM MISC 1 each by Does not apply route 1 day or 1 dose. 100 each 3 Taking  . Lancets (ACCU-CHEK MULTICLIX) lancets Once daily 250.00 100 each 3 Taking      ROS: As noted in HPI. All other systems are reviewed and are negative unless otherwise mentioned.   Physical Exam: Blood pressure (!) 154/86, pulse (!) 102, temperature 97.3 F (36.3 C), temperature source Oral, resp. rate 17,  height 5\' 8"  (1.727 m), weight 193 lb (87.5 kg), SpO2 96 %. Current Weight  11/04/15 193 lb (87.5 kg)  10/24/15 197 lb 6.4 oz (89.5 kg)  10/15/15 194 lb 4 oz (88.1 kg)  GEN: Well nourished, well developed, in no acute distress.  HEENT: normal.  Neck: Supple, no JVD, carotid bruits, or masses. Cardiac: RRR, no murmurs, rubs, or gallops. No clubbing, cyanosis, edema.  Radials/DP/PT 2+ and equal bilaterally.  Respiratory:  Respirations regular and unlabored. Diminished breath sounds bilaterally. GI: Soft, nontender, nondistended, BS + x 4. MS: no deformity or atrophy. Skin: warm and dry, no rash. Neuro:  Strength and sensation are intact. Psych: Normal affect.  Labs:   Lab Results  Component Value Date   WBC 11.7 (H) 11/04/2015   HGB 11.9 (L) 11/04/2015   HCT 36.4 (L) 11/04/2015   MCV 88.3 11/04/2015   PLT 212 11/04/2015    Recent Labs Lab 11/04/15 0718  NA 140  K 4.3  CL 104  CO2 24  BUN 17  CREATININE 1.20  CALCIUM 9.1  PROT 7.0  BILITOT 1.5*  ALKPHOS 66  ALT 20  AST 41  GLUCOSE 182*   Lab Results  Component Value Date   TROPONINI 0.06 (HH) 09/27/2015   TROPONINI <0.03 09/26/2015   TROPONINI <0.03 09/25/2015   Lab Results  Component Value Date   CHOL 137 09/25/2015   CHOL 160 08/30/2015     CHOL 133 04/22/2015   Lab Results  Component Value Date   HDL 47 09/25/2015   HDL 44.80 08/30/2015   HDL 42.40 04/22/2015   Lab Results  Component Value Date   LDLCALC 77 09/25/2015   LDLCALC 91 08/30/2015   LDLCALC 64 04/22/2015   Lab Results  Component Value Date   TRIG 64 09/25/2015   TRIG 118.0 08/30/2015   TRIG 133.0 04/22/2015   Lab Results  Component Value Date   CHOLHDL 2.9 09/25/2015   CHOLHDL 4 08/30/2015   CHOLHDL 3 04/22/2015   No results found for: LDLDIRECT  Lab Results  Component Value Date   PROBNP 175.0 (H) 07/16/2014   Lab Results  Component Value Date   TSH 2.47 08/30/2015   Lab Results  Component Value Date   HGBA1C 6.7 (H) 09/25/2015    Radiology: Ct Abdomen Pelvis Wo Contrast  Addendum Date: 11/04/2015   ADDENDUM REPORT: 11/04/2015 11:20 ADDENDUM: Study discussed by telephone with Dr. Duffy Bruce on 11/04/2015 At 1113 hours. He advised that on rectal exam there was a mix of bloody and purulent discharge. He suspects an abscess at the level of the anal verge, which these images probably do not get low enough to the demonstrate. If he sends the patient back for additional CT images through the perineum then I will report those on another addendum. Electronically Signed   By: Genevie Ann M.D.   On: 11/04/2015 11:20   Result Date: 11/04/2015 CLINICAL DATA:  75 year old male with rectal pain, not responding to his usual hemorrhoid treatment. Initial encounter. EXAM: CT ABDOMEN AND PELVIS WITHOUT CONTRAST TECHNIQUE: Multidetector CT imaging of the abdomen and pelvis was performed following the standard protocol without IV contrast. COMPARISON:  CTA abdomen and pelvis 10/12/2007. FINDINGS: Lower chest: Mild cardiomegaly.  No pericardial or pleural effusion. 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 However, there is also a 2 cm spiculated lung nodule at the right lower  lobe on series 3, image 8 (  and coronal image 89) with retractions of the surrounding lung parenchyma. There are smaller spiculated nodules also in the right middle lobe (series 3, image 4) right medial costophrenic angle (image 10) and left medial costophrenic angle (image 12). There are several additional distal peribronchial small nodules also in the left lower lobe (images 5 and 7). No upper abdominal free air or free fluid. Hepatobiliary: Mildly increased lobulation of the liver and suggestion of some interval left lobe hypertrophy. Several calcified granulomas in the liver, no other discrete liver lesion in the absence of IV contrast. Negative gallbladder. Pancreas: Negative. Spleen: Negative. (Punctate calcified granulomas occasionally noted). Adrenals/Urinary Tract: Negative adrenal glands. Right midpole nephrolithiasis measuring 6 mm. No hydronephrosis. No hydroureter or periureteral stranding. Chronic right renal lower pole cyst with simple fluid densitometry. Unremarkable urinary bladder. Stomach/Bowel: Negative rectum. No perirectal stranding. Mild gas and stool distending the rectum. The entire perineum is not included. Mildly redundant sigmoid colon with mild to moderate diverticulosis but no active inflammation. Occasional diverticulum left colon with mild retained stool. Oral contrast has reached the mid transverse colon. Redundant right colon. Negative appendix. Negative terminal ileum. No dilated small bowel. Decompressed stomach and duodenum. Vascular/Lymphatic: Chronic but progressed distal abdominal aorta fusiform aneurysm measuring up to 52 mm diameter (series 2, image 51 and coronal image 88). This was 36 mm in 2009. There is no evidence of aneurysm rupture. Extensive superimposed Aortoiliac calcified atherosclerosis noted. No IV contrast administered. No lymphadenopathy. Reproductive: Partially visible small bilateral hydroceles. Other: Small fat containing bilateral inguinal hernias. No  pelvic free fluid. Musculoskeletal: Osteopenia.  No acute or suspicious osseous lesion. IMPRESSION: 1. No abnormality which definitely explains acute rectal pain, but multiple abnormalities which require further follow-up as follows: 2. Progressed infrarenal abdominal aortic aneurysm now up to 52 mm diameter. No evidence of rupture. Recommend followup by abdomen and pelvis CTA in 3-6 months, and Vascular Surgery Referral/consultation if not already obtained. This recommendation follows ACR consensus guidelines: White Paper of the ACR Incidental Findings Committee II on Vascular Findings. J Am Coll Radiol 2013; 10:789-794. 3. Multiple spiculated lower lobe lung nodules, the largest is 2 cm in the right lower lobe. Underlying lower lobe Bronchiectasis such that these could be inflammatory, however, the appearance is suspicious for bronchogenic carcinoma. PET-CT evaluation would likely be most valuable at this point. 4. Mildly lobular appearance of the liver with suggestion of some left lobe hypertrophy since 2009, raising the possibility of developing cirrhosis. Electronically Signed: By: Genevie Ann M.D. On: 11/04/2015 11:08    EKG: not done  ASSESSMENT AND PLAN:  1. Perirectal abscess 2. Thrombosed hemorrhoid 3. CAD s/p remote stenting of the RCA in 2007 and 2009. More recent stenting of the OM on September 26 2015. He had a Biofreedom stent in which the polymer matrix dissolves and basically becomes a BMS after one month. Now on Plavix only for antiplatelet therapy 4. Chronic atrial fibrillation. Rate controlled. On chronic Eliquis for stroke risk reduction. 5. AAA 5.2 cm. This has increased in size from 2009 when it was 3.6 cm by CT. By ultrasound last ultrasound showed dimension of 4.4 cm in December 2016.  6. Lower lung nodules.  7. HTN 8. Hyperlipidemia 9. Tobacco abuse. 10. CKD stage 3  Plan: OK to hold Eliquis for 48 hours prior to surgery. This can be resumed post op. I would continue Plavix  monotherapy for now. If Plavix needed to be held for surgery we would need to hold for 5 days and  resume ASA. Since risk of major bleeding with lancing of abscess is low I would favor continuing Plavix alone. I would not stop all antiplatelet therapy with a fairly fresh coronary stent. Will need PET CT as outpatient to evaluate lung nodules. Will also need referral to vascular surgery as outpatient for evaluation of AAA.    Signed: Kaymon Denomme [place], [place]  11/04/2015, 5:37 PM

## 2015-11-04 NOTE — ED Triage Notes (Signed)
Pt states he has hemorrhoids. Pt normally uses OTC treatments but not helping this time.

## 2015-11-04 NOTE — Progress Notes (Signed)
ASON CASTROGIOVANNI [id number] Admitted to [id number]: 11/04/2015 4:53 PM Attending Provider: Waldemar Dickens, MD    Jason Fisher is a 75 y.o. male patient admitted from ED awake, alert  & orientated  X 3,  Full Code, VSS - Blood pressure (!) 154/86, pulse (!) 102, temperature 97.3 F (36.3 C), temperature source Oral, resp. rate 17, height 5\' 8"  (1.727 m), weight 87.5 kg (193 lb), SpO2 96 %., R/A, no c/o shortness of breath, no c/o chest pain, no distress noted. Non Tele placed and pt is currently running:normal sinus rhythm.   IV site WDL:  hand left, condition patent and no redness with a transparent dsg that's clean dry and intact.  Allergies:   Allergies  Allergen Reactions  . Contrast Media [Iodinated Diagnostic Agents] Anaphylaxis     Past Medical History:  Diagnosis Date  . 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 BENIGN PROSTATIC HYPERTROPHY, HX OF 02/24/2007  . CAD S/P PCI    a. 2007 Inf STEMI s/p Vision BMS  2 to RCA;  b. 2009 ISR Prox RCA Stent-->with DES;  c. 09/2015 PCI: LM nl, LAD 30p/m, D1 90, LCX nl, OM1 small, 80, OM2 90 (2.5x14 Biofreedom stent), OM3 small, RCA 30p ISR, 87m ISR, 43m, 85d, EF 50-55%.  . 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 Chronic atrial fibrillation ([place]) 11/06/2009   a. On Eliquis (CHA2DS2VASc = 5).  Rate controlled w/ BB and CCB.  Marland Kitchen Chronic diastolic CHF (congestive heart failure) ([place])    a. 09/2015 Echo: EF 55-60%, mildly dil LA.  . CKD (chronic kidney disease), stage III   . COLONIC POLYPS, HX OF 11/02/2006  . DIABETES MELLITUS, TYPE II 10/29/2006  . Diverticulosis   . History of tobacco abuse    a. Quit 09/2015.  Marland Kitchen HYPERLIPIDEMIA 10/29/2006  . Hypertensive heart disease with heart failure ([place])     History:  Will be obtained from the patient.  Pt orientation to unit, room and routine. Information packet given to patient/family and safety video watched.  Armband ID verified with patient/family, and in place. SR up x 2, fall risk assessment complete  with Patient and family verbalizing understanding of risks associated with falls. Pt verbalizes an understanding of how to use the call bell and to call for help before getting out of bed.  Skin, clean-dry- intact without evidence of bruising, or skin tears.   No evidence of skin break down noted on exam.    Will cont to monitor and assist as needed.  Alie, Morton, RN 11/04/2015 4:53 PM

## 2015-11-04 NOTE — Progress Notes (Signed)
Report received from [place], [place], will await for pt. Arrival to [phone].  Alphonzo Lemmings, RN

## 2015-11-04 NOTE — ED Notes (Signed)
Family at bedside. 

## 2015-11-04 NOTE — Consult Note (Signed)
[facility] Consult Note  STEVENS MAGWOOD December 14, 1940  [id number].    Requesting MD: Duffy Bruce Chief Complaint/Reason for Consult: Perianal abscess  HPI:  Jason Fisher is a 75yo male with an extensive PMH including history of diverticulosis as well as recent STEMI with bare-metal stent placement on Eliquis. He presented to the ED earlier this morning complaining of perirectal pain that began Friday. Reports long time history of hemorrhoids that intermittently flare up and he treats with OTC topical creams. Since Friday his pain has gradually gotten worse. The pain is constant and severe. Worse with sitting and any movement. He also reports noticing bloody drainage this morning. Denies fevers or chills. Denies abdominal pain, nausea, or vomiting. Reports a few episodes of melena last month but none recently. He is taking a stool softener to keep stools loose to help with pain. No prior history of rectal abscess.  Last meal last night Last BM today  PMH significant for HTN, HLD, DMII, CAD, MI s/p stent, Afib, AAA, CKD-III, tobacco abuse Abdominal surgical history includes AAA repair Anticoagulants: on Eliquis and Pradaxa Former smoker, quit about 45 days ago, prior to this he was smoking about 10 cigarettes/day  ROS: All systems reviewed and otherwise negative except for as above  Family History  Problem Relation Age of Onset  . Heart attack Mother   . Diabetes Mother   . Heart attack Father   . Hypertension Father   . Heart attack Brother   . Diabetes Brother     Past Medical History:  Diagnosis Date  . 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 BENIGN PROSTATIC HYPERTROPHY, HX OF 02/24/2007  . CAD S/P PCI    a. 2007 Inf STEMI s/p Vision BMS  2 to RCA;  b. 2009 ISR Prox RCA Stent-->with DES;  c. 09/2015 PCI: LM nl, LAD 30p/m, D1 90, LCX nl, OM1 small, 80, OM2 90 (2.5x14 Biofreedom stent), OM3 small, RCA 30p ISR, 60mISR, 442m85d, EF 50-55%.  . 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 Chronic [place] fibrillation [place]   a. On Eliquis (CHA2DS2VASc = 5).  Rate controlled w/ BB and CCB.  . Marland Kitchenhronic diastolic CHF (congestive heart failure) ([facility]   a. 09/2015 Echo: EF 55-60%, mildly dil LA.  . CKD (chronic kidney disease), stage III   . COLONIC POLYPS, HX OF 11/02/2006  . DIABETES MELLITUS, TYPE II 10/29/2006  . Diverticulosis   . History of tobacco abuse    a. Quit 09/2015.  . Marland KitchenYPERLIPIDEMIA 10/29/2006  . [facility]    Past Surgical History:  Procedure Laterality Date  . CARDIAC CATHETERIZATION N/A 09/26/2015   Procedure: Left Heart Cath and Coronary Angiography;  Surgeon: Peter M JoMartiniqueMD;  [facility];  Service: Cardiovascular;  Laterality: N/A;  . CARDIAC CATHETERIZATION N/A 09/26/2015   Procedure: Coronary Stent Intervention;  Surgeon: Peter M JoMartiniqueMD;  [facility];  Service: Cardiovascular;  Laterality: N/A;  . CATARACT EXTRACTION    . COLONOSCOPY    . CORONARY ANGIOPLASTY WITH STENT PLACEMENT  2007   Inferior STEMI 2007 - RCA PCI with 2 Vision BMS; Abnormal Myoview in 2009 - pRCA ins-stent restenosis & unstented disease - DES PCI Promus 3.0 x 20 mm (3.5 mm)  . SHOULDER SURGERY Right     Social History:  reports that he has quit smoking. His smoking use included Cigarettes. He has never used smokeless tobacco.  He reports that he does not drink alcohol or use drugs.  Allergies:  Allergies  Allergen Reactions  . Contrast Media [Iodinated Diagnostic Agents] Anaphylaxis     (Not in a hospital admission)  Blood pressure 166/94, pulse 76, temperature 97.7 F (36.5 C), temperature source Oral, resp. rate 17, height 5' 8" (1.727 m), weight 193 lb (87.5 kg), SpO2 95 %. Physical Exam: General: pleasant, WD/WN white male who is laying in bed in NAD HEENT: head is normocephalic, atraumatic.   Heart: regular, rate, and rhythm.  No obvious murmurs, gallops, or  rubs noted.  Palpable pedal pulses bilaterally Lungs: CTAB, no wheezes, rhonchi, or rales noted.  Respiratory effort nonlabored Abd: soft, NT/ND, +BS Perirectal: multiple skin tags external hemorrhoids with one thrombosed. Abscess noted along right perianal border with surrounding erythema and fluctuance, very tender to palpation. No active drainage. MS: all 4 extremities are symmetrical with no cyanosis, clubbing, or edema. Skin: warm and dry with no masses, lesions, or rashes Psych: A&Ox3 with an appropriate affect.   Results for orders placed or performed during the hospital encounter of 11/04/15 (from the past 48 hour(s))  CBC with Differential     Status: Abnormal   Collection Time: 11/04/15  7:06 AM  Result Value Ref Range   WBC 11.7 (H) 4.0 - 10.5 K/uL   RBC 4.12 (L) 4.22 - 5.81 MIL/uL   Hemoglobin 11.9 (L) 13.0 - 17.0 g/dL   HCT 36.4 (L) 39.0 - 52.0 %   MCV 88.3 78.0 - 100.0 fL   MCH 28.9 26.0 - 34.0 pg   MCHC 32.7 30.0 - 36.0 g/dL   RDW 14.6 11.5 - 15.5 %   Platelets 212 150 - 400 K/uL   Neutrophils Relative % 68 %   Neutro Abs 7.9 (H) 1.7 - 7.7 K/uL   Lymphocytes Relative 22 %   Lymphs Abs 2.5 0.7 - 4.0 K/uL   Monocytes Relative 9 %   Monocytes Absolute 1.1 (H) 0.1 - 1.0 K/uL   Eosinophils Relative 1 %   Eosinophils Absolute 0.1 0.0 - 0.7 K/uL   Basophils Relative 0 %   Basophils Absolute 0.1 0.0 - 0.1 K/uL  Comprehensive metabolic panel     Status: Abnormal   Collection Time: 11/04/15  7:18 AM  Result Value Ref Range   Sodium 140 135 - 145 mmol/L   Potassium 4.3 3.5 - 5.1 mmol/L    Comment: HEMOLYSIS AT THIS LEVEL MAY AFFECT RESULT   Chloride 104 101 - 111 mmol/L   CO2 24 22 - 32 mmol/L   Glucose, Bld 182 (H) 65 - 99 mg/dL   BUN 17 6 - 20 mg/dL   Creatinine, Ser 1.20 0.61 - 1.24 mg/dL   Calcium 9.1 8.9 - 10.3 mg/dL   Total Protein 7.0 6.5 - 8.1 g/dL   Albumin 3.6 3.5 - 5.0 g/dL   AST 41 15 - 41 U/L   ALT 20 17 - 63 U/L   Alkaline Phosphatase 66 38 - 126 U/L    Total Bilirubin 1.5 (H) 0.3 - 1.2 mg/dL   GFR calc non Af Amer 58 (L) >60 mL/min   GFR calc Af Amer >60 >60 mL/min    Comment: (NOTE) The eGFR has been calculated using the CKD EPI equation. This calculation has not been validated in all clinical situations. eGFR's persistently <60 mL/min signify possible Chronic Kidney Disease.    Anion gap 12 5 - 15  I-Stat CG4 Lactic Acid, ED     Status:  Abnormal   Collection Time: 11/04/15  7:32 AM  Result Value Ref Range   Lactic Acid, Venous 2.52 (HH) 0.5 - 1.9 mmol/L   Comment NOTIFIED PHYSICIAN    Ct Abdomen Pelvis Wo Contrast  Addendum Date: 11/04/2015   ADDENDUM REPORT: 11/04/2015 11:20 ADDENDUM: Study discussed by telephone with Dr. Duffy Bruce on 11/04/2015 At 1113 hours. He advised that on rectal exam there was a mix of bloody and purulent discharge. He suspects an abscess at the level of the anal verge, which these images probably do not get low enough to the demonstrate. If he sends the patient back for additional CT images through the perineum then I will report those on another addendum. Electronically Signed   By: Genevie Ann M.D.   On: 11/04/2015 11:20   Result Date: 11/04/2015 CLINICAL DATA:  75 year old male with rectal pain, not responding to his usual hemorrhoid treatment. Initial encounter. EXAM: CT ABDOMEN AND PELVIS WITHOUT CONTRAST TECHNIQUE: Multidetector CT imaging of the abdomen and pelvis was performed following the standard protocol without IV contrast. COMPARISON:  CTA abdomen and pelvis 10/12/2007. FINDINGS: Lower chest: Mild cardiomegaly.  No pericardial or pleural effusion. 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 However, there is also a 2 cm spiculated lung nodule at the right lower lobe on series 3, image 8 (and coronal image 89) with retractions of the surrounding lung parenchyma. There are smaller spiculated nodules also in the right  middle lobe (series 3, image 4) right medial costophrenic angle (image 10) and left medial costophrenic angle (image 12). There are several additional distal peribronchial small nodules also in the left lower lobe (images 5 and 7). No upper abdominal free air or free fluid. Hepatobiliary: Mildly increased lobulation of the liver and suggestion of some interval left lobe hypertrophy. Several calcified granulomas in the liver, no other discrete liver lesion in the absence of IV contrast. Negative gallbladder. Pancreas: Negative. Spleen: Negative. (Punctate calcified granulomas occasionally noted). Adrenals/Urinary Tract: Negative adrenal glands. Right midpole nephrolithiasis measuring 6 mm. No hydronephrosis. No hydroureter or periureteral stranding. Chronic right renal lower pole cyst with simple fluid densitometry. Unremarkable urinary bladder. Stomach/Bowel: Negative rectum. No perirectal stranding. Mild gas and stool distending the rectum. The entire perineum is not included. Mildly redundant sigmoid colon with mild to moderate diverticulosis but no active inflammation. Occasional diverticulum left colon with mild retained stool. Oral contrast has reached the mid transverse colon. Redundant right colon. Negative appendix. Negative terminal ileum. No dilated small bowel. Decompressed stomach and duodenum. Vascular/Lymphatic: Chronic but progressed distal abdominal aorta fusiform aneurysm measuring up to 52 mm diameter (series 2, image 51 and coronal image 88). This was 36 mm in 2009. There is no evidence of aneurysm rupture. Extensive superimposed Aortoiliac calcified atherosclerosis noted. No IV contrast administered. No lymphadenopathy. Reproductive: Partially visible small bilateral hydroceles. Other: Small fat containing bilateral inguinal hernias. No pelvic free fluid. Musculoskeletal: Osteopenia.  No acute or suspicious osseous lesion. IMPRESSION: 1. No abnormality which definitely explains acute rectal pain,  but multiple abnormalities which require further follow-up as follows: 2. Progressed infrarenal abdominal aortic aneurysm now up to 52 mm diameter. No evidence of rupture. Recommend followup by abdomen and pelvis CTA in 3-6 months, and Vascular Surgery Referral/consultation if not already obtained. This recommendation follows ACR consensus guidelines: White Paper of the ACR Incidental Findings Committee II on Vascular Findings. J Am Coll Radiol 2013; 10:789-794. 3. Multiple spiculated lower lobe  lung nodules, the largest is 2 cm in the right lower lobe. Underlying lower lobe Bronchiectasis such that these could be inflammatory, however, the appearance is suspicious for bronchogenic carcinoma. PET-CT evaluation would likely be most valuable at this point. 4. Mildly lobular appearance of the liver with suggestion of some left lobe hypertrophy since 2009, raising the possibility of developing cirrhosis. Electronically Signed: By: Genevie Ann M.D. On: 11/04/2015 11:08      Assessment/Plan Perianal abscess  - CT: no abnormality which definitely explains acute rectal pain  - small perianal abscess noted on exam as well as external hemorrhoids (one that is thrombosed) - abscess will need to be lanced but would like patient to be off blood thinners for 2-3 days prior to lancing at bed side or in [place] - recommend medicine admit due to multiple medical problems. Will start him on zosyn. Please consult cardiology for possible preop evaluation and to see if/when he can hold blood thinners.    Jerrye Beavers, [facility] 11/04/2015, 12:41 PM Pager: [phone] Consults: [phone] Mon-Fri 7:00 am-4:30 pm Sat-Sun 7:00 am-11:30 am

## 2015-11-04 NOTE — H&P (Addendum)
History and Physical    Jason Fisher [id number] DOB: 1940/03/30 DOA: 11/04/2015  PCP: Nyoka Cowden, MD Patient coming from: home  Chief Complaint: anal pain  HPI: CYLAR LAPA is a 75 y.o. male presenting with perirectal pain. Started 3 days ago. Patient states he has a history of hemorrhoids and thought that this was simply hemorrhoidal flare. Used OTC topical creams without benefit. Pain initially dull and mild but now severe constant. Worse with sitting and certain movements. Bloody drainage noted this morning. Denies any fevers, chills, chest pain, abdominal pain, nausea, vomiting, dysuria, frequency, back pain, rash. Bloody stools noted with recent bowel movement. Patient recently underwent cardiac catheterization with stent placement. Pt on Eliquis.    ED Course: Inject the findings outlined below. Surgery consult and recommending medicine admission. Started on Zosyn.  Review of Systems: As per HPI otherwise 10 point review of systems negative.   Ambulatory Status: No restrictions  Past Medical History:  Diagnosis Date  . 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 BENIGN PROSTATIC HYPERTROPHY, HX OF 02/24/2007  . CAD S/P PCI    a. 2007 Inf STEMI s/p Vision BMS  2 to RCA;  b. 2009 ISR Prox RCA Stent-->with DES;  c. 09/2015 PCI: LM nl, LAD 30p/m, D1 90, LCX nl, OM1 small, 80, OM2 90 (2.5x14 Biofreedom stent), OM3 small, RCA 30p ISR, 57m ISR, 71m, 85d, EF 50-55%.  . 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 Chronic atrial fibrillation ([place]) 11/06/2009   a. On Eliquis (CHA2DS2VASc = 5).  Rate controlled w/ BB and CCB.  Marland Kitchen Chronic diastolic CHF (congestive heart failure) ([place])    a. 09/2015 Echo: EF 55-60%, mildly dil LA.  . CKD (chronic kidney disease), stage III   . COLONIC POLYPS, HX OF 11/02/2006  . DIABETES MELLITUS, TYPE II 10/29/2006  . Diverticulosis   . History of tobacco abuse    a. Quit 09/2015.  Marland Kitchen HYPERLIPIDEMIA 10/29/2006  . Hypertensive heart disease with  heart failure [facility])     Past Surgical History:  Procedure Laterality Date  . CARDIAC CATHETERIZATION N/A 09/26/2015   Procedure: Left Heart Cath and Coronary Angiography;  Surgeon: Peter M Martinique, MD;  Location: [place] [facility];  Service: Cardiovascular;  Laterality: N/A;  . CARDIAC CATHETERIZATION N/A 09/26/2015   Procedure: Coronary Stent Intervention;  Surgeon: Peter M Martinique, MD;  Location: [place] [facility];  Service: Cardiovascular;  Laterality: N/A;  . CATARACT EXTRACTION    . COLONOSCOPY    . CORONARY ANGIOPLASTY WITH STENT PLACEMENT  2007   Inferior STEMI 2007 - RCA PCI with 2 Vision BMS; Abnormal Myoview in 2009 - pRCA ins-stent restenosis & unstented disease - DES PCI Promus 3.0 x 20 mm (3.5 mm)  . SHOULDER SURGERY Right     Social History   Social History  . Marital status: Married    Spouse name: N/A  . Number of children: 5  . Years of education: N/A   Occupational History  . [employer]    Social History Main Topics  . Smoking status: Former Smoker    Types: Cigarettes  . Smokeless tobacco: Never Used     Comment: passive smoker as well  . Alcohol use No  . Drug use: No  . Sexual activity: Not on file   Other Topics Concern  . Not on file   Social History Narrative  . No narrative on file    Allergies  Allergen Reactions  . Contrast Media [Iodinated Diagnostic Agents]  Anaphylaxis    Family History  Problem Relation Age of Onset  . Heart attack Mother   . Diabetes Mother   . Heart attack Father   . Hypertension Father   . Heart attack Brother   . Diabetes Brother     Prior to Admission medications   Medication Sig Start Date End Date Taking? Authorizing Provider  ACCU-CHEK AVIVA PLUS test strip CHECK BLOOD SUGAR ONCE DAILY AS NEEDED 03/27/15   Marletta Lor, MD  apixaban (ELIQUIS) 5 MG TABS tablet Take 1 tablet (5 mg total) by mouth 2 (two) times daily. 01/21/15   Marletta Lor, MD  atorvastatin (LIPITOR) 20 MG tablet TAKE 1  TABLET BY MOUTH EVERY DAY 03/12/15   Marletta Lor, MD  clopidogrel (PLAVIX) 75 MG tablet TAKE 1 TABLET BY MOUTH EVERY DAY 09/23/15   Marletta Lor, MD  diltiazem (CARDIZEM CD) 240 MG 24 hr capsule Take 1 capsule (240 mg total) by mouth daily. 10/04/15   Orson Eva, MD  furosemide (LASIX) 40 MG tablet Take 1 tablet (40 mg total) by mouth daily. 10/02/15   Orson Eva, MD  glimepiride (AMARYL) 4 MG tablet Take 0.5 tablets (2 mg total) by mouth daily with breakfast. 10/15/15   Marletta Lor, MD  Insulin Pen Needle 29G X 12.7MM MISC 1 each by Does not apply route 1 day or 1 dose. 01/06/12   Marletta Lor, MD  Lancets (ACCU-CHEK MULTICLIX) lancets Once daily 250.00 01/09/13   Marletta Lor, MD  metFORMIN (GLUCOPHAGE) 1000 MG tablet TAKE 1 TABLET TWICE DAILY WITH A MEAL 07/27/14   Marletta Lor, MD  metoprolol (LOPRESSOR) 100 MG tablet Take 1 tablet (100 mg total) by mouth 2 (two) times daily. 03/07/14   Marletta Lor, MD  NITROSTAT 0.4 MG SL tablet PLACE 1 TABLET UNDER TONGUE EVERY 5 MINUTES AS NEEDED 11/29/13   Marletta Lor, MD  polyethylene glycol powder (GLYCOLAX/MIRALAX) powder Take 17 g by mouth 2 (two) times daily as needed. Patient taking differently: Take 17 g by mouth 2 (two) times daily as needed for mild constipation.  09/06/15   Marletta Lor, MD  ramipril (ALTACE) 10 MG capsule TAKE ONE CAPSULE BY MOUTH EVERY DAY 03/12/15   Marletta Lor, MD  VICTOZA 18 MG/3ML SOPN INJECT 0.3MLS INTO SKIN DAILY 08/29/15   Marletta Lor, MD    Physical Exam: Vitals:   11/04/15 1445 11/04/15 1500 11/04/15 1515 11/04/15 1610  BP: (!) 153/104 (!) 148/104 163/82 (!) 154/86  Pulse: 90 81 93 (!) 102  Resp:      Temp:    97.3 F (36.3 C)  TempSrc:    Oral  SpO2: 95% 93% 94% 96%  Weight:      Height:         General:  Appears calm and comfortable Eyes:  PERRL, EOMI, normal lids, iris ENT:  grossly normal hearing, lips & tongue, mmm Neck:  no LAD,  masses or thyromegaly Cardiovascular:  RRR, II/VI systolic murmur. trace LE edema.  Respiratory:  CTA bilaterally, no w/r/r. Normal respiratory effort. Abdomen:  soft, ntnd, NABS Skin: Anal examination deferred by patient due to multiple recent examinations by EDP surgical team. Patient finally in a comfortable position with limited pain. No other obvious evidence of rash or skin breakdown. Musculoskeletal:  grossly normal tone BUE/BLE, good ROM, no bony abnormality Psychiatric:  grossly normal mood and affect, speech fluent and appropriate, AOx3 Neurologic:  CN 2-12 grossly intact,  moves all extremities in coordinated fashion, sensation intact  Labs on Admission: I have personally reviewed following labs and imaging studies  CBC:  Recent Labs Lab 11/04/15 0706  WBC 11.7*  NEUTROABS 7.9*  HGB 11.9*  HCT 36.4*  MCV 88.3  PLT 99991111   Basic Metabolic Panel:  Recent Labs Lab 11/04/15 0718  NA 140  K 4.3  CL 104  CO2 24  GLUCOSE 182*  BUN 17  CREATININE 1.20  CALCIUM 9.1   GFR: Estimated Creatinine Clearance: 58.1 mL/min (by C-G formula based on SCr of 1.2 mg/dL). Liver Function Tests:  Recent Labs Lab 11/04/15 0718  AST 41  ALT 20  ALKPHOS 66  BILITOT 1.5*  PROT 7.0  ALBUMIN 3.6   No results for input(s): LIPASE, AMYLASE in the last 168 hours. No results for input(s): AMMONIA in the last 168 hours. Coagulation Profile: No results for input(s): INR, PROTIME in the last 168 hours. Cardiac Enzymes: No results for input(s): CKTOTAL, CKMB, CKMBINDEX, TROPONINI in the last 168 hours. BNP (last 3 results) No results for input(s): PROBNP in the last 8760 hours. HbA1C: No results for input(s): HGBA1C in the last 72 hours. CBG: No results for input(s): GLUCAP in the last 168 hours. Lipid Profile: No results for input(s): CHOL, HDL, LDLCALC, TRIG, CHOLHDL, LDLDIRECT in the last 72 hours. Thyroid Function Tests: No results for input(s): TSH, T4TOTAL, FREET4, T3FREE,  THYROIDAB in the last 72 hours. Anemia Panel: No results for input(s): VITAMINB12, FOLATE, FERRITIN, TIBC, IRON, RETICCTPCT in the last 72 hours. Urine analysis:    Component Value Date/Time   COLORURINE YELLOW 01/14/2010 0629   APPEARANCEUR CLOUDY (A) 01/14/2010 0629   LABSPEC 1.024 01/14/2010 0629   PHURINE 7.0 01/14/2010 0629   GLUCOSEU 100 (A) 01/14/2010 0629   HGBUR LARGE (A) 01/14/2010 0629   HGBUR trace-intact 02/16/2007 1038   BILIRUBINUR n 08/30/2015 1045   KETONESUR NEGATIVE 01/14/2010 0629   PROTEINUR n 08/30/2015 1045   PROTEINUR NEGATIVE 01/14/2010 0629   UROBILINOGEN 1.0 08/30/2015 1045   UROBILINOGEN 1.0 01/14/2010 0629   NITRITE n 08/30/2015 1045   NITRITE NEGATIVE 01/14/2010 0629   LEUKOCYTESUR Negative 08/30/2015 1045    Creatinine Clearance: Estimated Creatinine Clearance: 58.1 mL/min (by C-G formula based on SCr of 1.2 mg/dL).  Sepsis Labs: @LABRCNTIP (procalcitonin:4,lacticidven:4) )No results found for this or any previous visit (from the past 240 hour(s)).   Radiological Exams on Admission: Ct Abdomen Pelvis Wo Contrast  Addendum Date: 11/04/2015   ADDENDUM REPORT: 11/04/2015 11:20 ADDENDUM: Study discussed by telephone with Dr. Duffy Bruce on 11/04/2015 At 1113 hours. He advised that on rectal exam there was a mix of bloody and purulent discharge. He suspects an abscess at the level of the anal verge, which these images probably do not get low enough to the demonstrate. If he sends the patient back for additional CT images through the perineum then I will report those on another addendum. Electronically Signed   By: Genevie Ann M.D.   On: 11/04/2015 11:20   Result Date: 11/04/2015 CLINICAL DATA:  75 year old male with rectal pain, not responding to his usual hemorrhoid treatment. Initial encounter. EXAM: CT ABDOMEN AND PELVIS WITHOUT CONTRAST TECHNIQUE: Multidetector CT imaging of the abdomen and pelvis was performed following the standard protocol without IV  contrast. COMPARISON:  CTA abdomen and pelvis 10/12/2007. FINDINGS: Lower chest: Mild cardiomegaly.  No pericardial or pleural effusion. 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 However, there is also a 2 cm spiculated lung nodule at the right lower lobe on series 3, image 8 (and coronal image 89) with retractions of the surrounding lung parenchyma. There are smaller spiculated nodules also in the right middle lobe (series 3, image 4) right medial costophrenic angle (image 10) and left medial costophrenic angle (image 12). There are several additional distal peribronchial small nodules also in the left lower lobe (images 5 and 7). No upper abdominal free air or free fluid. Hepatobiliary: Mildly increased lobulation of the liver and suggestion of some interval left lobe hypertrophy. Several calcified granulomas in the liver, no other discrete liver lesion in the absence of IV contrast. Negative gallbladder. Pancreas: Negative. Spleen: Negative. (Punctate calcified granulomas occasionally noted). Adrenals/Urinary Tract: Negative adrenal glands. Right midpole nephrolithiasis measuring 6 mm. No hydronephrosis. No hydroureter or periureteral stranding. Chronic right renal lower pole cyst with simple fluid densitometry. Unremarkable urinary bladder. Stomach/Bowel: Negative rectum. No perirectal stranding. Mild gas and stool distending the rectum. The entire perineum is not included. Mildly redundant sigmoid colon with mild to moderate diverticulosis but no active inflammation. Occasional diverticulum left colon with mild retained stool. Oral contrast has reached the mid transverse colon. Redundant right colon. Negative appendix. Negative terminal ileum. No dilated small bowel. Decompressed stomach and duodenum. Vascular/Lymphatic: Chronic but progressed distal abdominal aorta fusiform aneurysm measuring up to 52 mm diameter (series 2,  image 51 and coronal image 88). This was 36 mm in 2009. There is no evidence of aneurysm rupture. Extensive superimposed Aortoiliac calcified atherosclerosis noted. No IV contrast administered. No lymphadenopathy. Reproductive: Partially visible small bilateral hydroceles. Other: Small fat containing bilateral inguinal hernias. No pelvic free fluid. Musculoskeletal: Osteopenia.  No acute or suspicious osseous lesion. IMPRESSION: 1. No abnormality which definitely explains acute rectal pain, but multiple abnormalities which require further follow-up as follows: 2. Progressed infrarenal abdominal aortic aneurysm now up to 52 mm diameter. No evidence of rupture. Recommend followup by abdomen and pelvis CTA in 3-6 months, and Vascular Surgery Referral/consultation if not already obtained. This recommendation follows ACR consensus guidelines: White Paper of the ACR Incidental Findings Committee II on Vascular Findings. J Am Coll Radiol 2013; 10:789-794. 3. Multiple spiculated lower lobe lung nodules, the largest is 2 cm in the right lower lobe. Underlying lower lobe Bronchiectasis such that these could be inflammatory, however, the appearance is suspicious for bronchogenic carcinoma. PET-CT evaluation would likely be most valuable at this point. 4. Mildly lobular appearance of the liver with suggestion of some left lobe hypertrophy since 2009, raising the possibility of developing cirrhosis. Electronically Signed: By: Genevie Ann M.D. On: 11/04/2015 11:08    EKG: Independently reviewed.   Assessment/Plan Principal Problem:   Perirectal abscess Active Problems:   Dyslipidemia   Essential hypertension   MYOCARDIAL INFARCTION, HX OF   Diastolic congestive heart failure (HCC)   Paroxysmal atrial fibrillation (HCC)   Diabetes mellitus with complication (HCC)   Perirectal abscess: CCS consult addendum recommending IV antibiotics until patient is no longer anticoagulated at which time we will perform I&D. Lactic  acid 2.52, WBC 11.7 with left shift, afebrile and vital signs stable. - Continue zosyn - Cards clearance for surgery per CCS request - Mgt per [facility] (unfortunately ABX already started)  Lactic Acidemia: 2.52 on admission. No evidence for sepsis. Poor oral intake over last several days w/ increased sedentary lifestyle. 1L NS bolus in ED - trend lactic acid - gentle IVF - CK  DM: -  SSI  HTN/Afib/CHF/CAD s/p Stent: last cath 09/2015. No current evidence of ACS or CHF decompensation. - Cards consult for pre-op eval and recommendations for Plavix, Eliquis mgt (will continue these medications for now) - continue lopressor, Ramipril, Cardizem - Continue lasix - continue lipitor - Hydralazine prn  Spiculated lung nodules: 2cm in RLL. Noted on CT. Smoker. Rads recommending outpt PET-CT for better evaluation.   AAA: 70mm noted on CT. outpt CTA in 44mo recommended   DVT prophylaxis: Eliquis  Code Status: FULL  Family Communication: none  Disposition Plan: pending improvement  Consults called: Cards, CCS  Admission status: Inpt    Mackenzi Krogh J MD [facility]  If 7PM-7AM, please contact night-coverage [email or web address] Password [email or web address]  11/04/2015, 4:40 PM

## 2015-11-04 NOTE — ED Provider Notes (Signed)
[facility] DEPT Provider Note   CSN: [id number] Arrival date & time: 11/04/15  [id number]     History   Chief Complaint Chief Complaint  Patient presents with  . Rectal Pain    HPI Jason Fisher is a 75 y.o. male.  HPI 75 year old male with extensive past medical history including history of diverticulosis as well as recent STEMI with bare-metal stent placement on Eliquis who presents with perirectal pain. Patient states he has had hemorrhoids for his entire life. He intermittently has flares up that he treats topically with over-the-counter creams when he left the hospital 5 weeks ago from his STEMI, he states he had mild exacerbation of her hemorrhoids. These improved with topical treatment and his symptoms had resolved or at least improved over the last several weeks. Starting Friday. However, he developed progressively worsening severe aching, throbbing perianal pain. He has exquisite pain with any movement as well as walking and he has been unable to sit due to this pain. Denies any associated fevers or chills. He did begin to have some bloody purulent drainage earlier today. No history of rectal abscess.   Past Medical History:  Diagnosis Date  . 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 BENIGN PROSTATIC HYPERTROPHY, HX OF 02/24/2007  . CAD S/P PCI    a. 2007 Inf STEMI s/p Vision BMS  2 to RCA;  b. 2009 ISR Prox RCA Stent-->with DES;  c. 09/2015 PCI: LM nl, LAD 30p/m, D1 90, LCX nl, OM1 small, 80, OM2 90 (2.5x14 Biofreedom stent), OM3 small, RCA 30p ISR, 54m ISR, 47m, 85d, EF 50-55%.  . 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 Chronic atrial fibrillation ([place]) 11/06/2009   a. On Eliquis (CHA2DS2VASc = 5).  Rate controlled w/ BB and CCB.  Marland Kitchen Chronic diastolic CHF (congestive heart failure) ([place])    a. 09/2015 Echo: EF 55-60%, mildly dil LA.  . CKD (chronic kidney disease), stage III   . COLONIC POLYPS, HX OF 11/02/2006  . DIABETES MELLITUS, TYPE II 10/29/2006  . Diverticulosis   .  History of tobacco abuse    a. Quit 09/2015.  Marland Kitchen HYPERLIPIDEMIA 10/29/2006  . Hypertensive heart disease with heart failure [facility])     Patient Active Problem List   Diagnosis Date Noted  . HCAP (healthcare-associated pneumonia) 09/29/2015  . Allergic reaction to contrast dye 09/27/2015  . Accelerated hypertension with diastolic congestive heart failure, NYHA class 3 ([place]) 09/27/2015  . Acute respiratory failure with hypoxia ([place]) 09/27/2015  . Unstable angina ([place]) 09/26/2015  . CHF (congestive heart failure) ([place]) 09/25/2015  . Progressive angina ([place]) 09/25/2015  . CKD (chronic kidney disease), stage III   . Acute on chronic combined systolic and diastolic congestive heart failure ([place])   . Diastolic congestive heart failure ([place])   . Paroxysmal atrial fibrillation (H[facility])   . Angiodysplasia of colon 04/19/2015  . TOBACCO USER 04/17/2010  . Chronic atrial fibrillation ([place]): CHA2DS2Vasc = 5. On Warfarin. Rate control 11/06/2009  . Occlusion and stenosis of carotid artery without mention of cerebral infarction 07/13/2008  . Abdominal aortic aneurysm ([place]) 07/13/2008  . BENIGN PROSTATIC HYPERTROPHY, HX OF 02/24/2007  . MYOCARDIAL INFARCTION, HX OF 11/02/2006  . CAD S/P percutaneous coronary angioplasty 11/02/2006  . History of colonic polyps 11/02/2006  . DM (diabetes mellitus), type 2 with peripheral vascular complications ([place]) [id number]  . Dyslipidemia 10/29/2006  . Essential hypertension 10/29/2006    Past Surgical History:  Procedure Laterality Date  . CARDIAC CATHETERIZATION N/A 09/26/2015   Procedure:  Left Heart Cath and Coronary Angiography;  Surgeon: Peter M Martinique, MD;  Location: [place] [facility] LAB;  Service: Cardiovascular;  Laterality: N/A;  . CARDIAC CATHETERIZATION N/A 09/26/2015   Procedure: Coronary Stent Intervention;  Surgeon: Peter M Martinique, MD;  Location: [facility];  Service: Cardiovascular;  Laterality: N/A;  . CATARACT EXTRACTION    . COLONOSCOPY    .  CORONARY ANGIOPLASTY WITH STENT PLACEMENT  2007   Inferior STEMI 2007 - RCA PCI with 2 Vision BMS; Abnormal Myoview in 2009 - pRCA ins-stent restenosis & unstented disease - DES PCI Promus 3.0 x 20 mm (3.5 mm)  . SHOULDER SURGERY Right        Home Medications    Prior to Admission medications   Medication Sig Start Date End Date Taking? Authorizing Provider  ACCU-CHEK AVIVA PLUS test strip CHECK BLOOD SUGAR ONCE DAILY AS NEEDED 03/27/15   Marletta Lor, MD  apixaban (ELIQUIS) 5 MG TABS tablet Take 1 tablet (5 mg total) by mouth 2 (two) times daily. 01/21/15   Marletta Lor, MD  atorvastatin (LIPITOR) 20 MG tablet TAKE 1 TABLET BY MOUTH EVERY DAY 03/12/15   Marletta Lor, MD  clopidogrel (PLAVIX) 75 MG tablet TAKE 1 TABLET BY MOUTH EVERY DAY 09/23/15   Marletta Lor, MD  diltiazem (CARDIZEM CD) 240 MG 24 hr capsule Take 1 capsule (240 mg total) by mouth daily. 10/04/15   Orson Eva, MD  furosemide (LASIX) 40 MG tablet Take 1 tablet (40 mg total) by mouth daily. 10/02/15   Orson Eva, MD  glimepiride (AMARYL) 4 MG tablet Take 0.5 tablets (2 mg total) by mouth daily with breakfast. 10/15/15   Marletta Lor, MD  Insulin Pen Needle 29G X 12.7MM MISC 1 each by Does not apply route 1 day or 1 dose. 01/06/12   Marletta Lor, MD  Lancets (ACCU-CHEK MULTICLIX) lancets Once daily 250.00 01/09/13   Marletta Lor, MD  metFORMIN (GLUCOPHAGE) 1000 MG tablet TAKE 1 TABLET TWICE DAILY WITH A MEAL 07/27/14   Marletta Lor, MD  metoprolol (LOPRESSOR) 100 MG tablet Take 1 tablet (100 mg total) by mouth 2 (two) times daily. 03/07/14   Marletta Lor, MD  NITROSTAT 0.4 MG SL tablet PLACE 1 TABLET UNDER TONGUE EVERY 5 MINUTES AS NEEDED 11/29/13   Marletta Lor, MD  polyethylene glycol powder (GLYCOLAX/MIRALAX) powder Take 17 g by mouth 2 (two) times daily as needed. Patient taking differently: Take 17 g by mouth 2 (two) times daily as needed for mild constipation.   09/06/15   Marletta Lor, MD  ramipril (ALTACE) 10 MG capsule TAKE ONE CAPSULE BY MOUTH EVERY DAY 03/12/15   Marletta Lor, MD  VICTOZA 18 MG/3ML SOPN INJECT 0.3MLS INTO SKIN DAILY 08/29/15   Marletta Lor, MD    Family History Family History  Problem Relation Age of Onset  . Heart attack Mother   . Diabetes Mother   . Heart attack Father   . Hypertension Father   . Heart attack Brother   . Diabetes Brother     Social History Social History  Substance Use Topics  . Smoking status: Former Smoker    Types: Cigarettes  . Smokeless tobacco: Never Used     Comment: passive smoker as well  . Alcohol use No     Allergies   Contrast media [iodinated diagnostic agents]   Review of Systems Review of Systems  Constitutional: Negative for chills, fatigue  and fever.  HENT: Negative for congestion and rhinorrhea.   Eyes: Negative for visual disturbance.  Respiratory: Negative for cough, shortness of breath and wheezing.   Cardiovascular: Negative for chest pain and leg swelling.  Gastrointestinal: Positive for anal bleeding, blood in stool and rectal pain. Negative for abdominal pain, diarrhea, nausea and vomiting.  Genitourinary: Negative for dysuria and flank pain.  Musculoskeletal: Negative for neck pain and neck stiffness.  Skin: Positive for rash. Negative for wound.  Allergic/Immunologic: Negative for immunocompromised state.  Neurological: Negative for syncope, weakness and headaches.  All other systems reviewed and are negative.    Physical Exam Updated Vital Signs BP 150/88   Pulse 75   Temp 97.6 F (36.4 C) (Oral)   Resp 17   Ht 5\' 8"  (1.727 m)   Wt 193 lb (87.5 kg)   SpO2 97%   BMI 29.35 kg/m   Physical Exam  Constitutional: He is oriented to person, place, and time. He appears well-developed and well-nourished. No distress.  HENT:  Head: Normocephalic and atraumatic.  Eyes: Conjunctivae are normal.  Neck: Neck supple.  Cardiovascular:  Normal rate, regular rhythm and normal heart sounds.  Exam reveals no friction rub.   No murmur heard. Pulmonary/Chest: Effort normal and breath sounds normal. No respiratory distress. He has no wheezes. He has no rales.  Abdominal: He exhibits no distension.  Genitourinary:  Genitourinary Comments: Multiple external hemorrhoids and skin tags. Along the right perianal border, there is an apparent thrombosed hemorrhoid, but also surrounding erythema and fluctuance with expressible purulence. On digital rectal exam, patient has fluctuance adjacent to this perianal mass and purulence can be expressed upon internal pressure with the digit.  Musculoskeletal: He exhibits no edema.  Neurological: He is alert and oriented to person, place, and time. He exhibits normal muscle tone.  Skin: Skin is warm. Capillary refill takes less than 2 seconds.  Psychiatric: He has a normal mood and affect.  Nursing note and vitals reviewed.    ED Treatments / Results  Labs (all labs ordered are listed, but only abnormal results are displayed) Labs Reviewed - No data to display  EKG  EKG Interpretation None       Radiology No results found.  Procedures Procedures (including critical care time)  Medications Ordered in ED Medications - No data to display   Initial Impression / Assessment and Plan / ED Course  I have reviewed the triage vital signs and the nursing notes.  Pertinent labs & imaging results that were available during my care of the patient were reviewed by me and considered in my medical decision making (see chart for details).  Clinical Course    75 year old male with extensive past medical history including diverticulosis, coronary disease, diabetes, and recent STEMI who presents with perirectal pain. On arrival, his vital signs are stable and patient is afebrile. Examination is consistent with perianal abscess as well as concern for developing perirectal involvement given tenderness  and fluctuance on digital rectal exam. Labwork shows leukocytosis as well as mild lactic acidosis, which I believe is due to mild dehydration in the setting of not eating or drinking due to pain. Will obtain CT scan with by mouth contrast as he is allergic to IV contrast.  CT scan shows no evidence of perirectal involvement. However, scan does not extend to the anal verge. Given patient's leukocytosis and lactic acidosis as well as fluctuance on exam. I remain concerned about perirectal abscess. Will consult surgery.  Surgery has evaluated and  is in agreement. Will admit to medicine for IV antibiotics, cardiology consultation regarding surgical risk, and further monitoring. Patient is in agreement with this plan.  Of note, pt has AAA that has enlarged on exam. No abdominal pain or signs of rupture - will need follow-up for this. Patient also has pulmonary nodules with c/f CA - will need outpt PET scan per Radiology. Pt aware.  Final Clinical Impressions(s) / ED Diagnoses   Final diagnoses:  Abdominal aortic aneurysm (AAA) without rupture (HCC)  Pulmonary nodule  Perirectal abscess  SIRS (systemic inflammatory response syndrome) (HCC)      Duffy Bruce, MD 11/04/15 1540

## 2015-11-04 NOTE — ED Notes (Signed)
Attempted report x1. 

## 2015-11-05 ENCOUNTER — Other Ambulatory Visit: Payer: Self-pay | Admitting: *Deleted

## 2015-11-05 DIAGNOSIS — I714 Abdominal aortic aneurysm, without rupture, unspecified: Secondary | ICD-10-CM

## 2015-11-05 DIAGNOSIS — R918 Other nonspecific abnormal finding of lung field: Secondary | ICD-10-CM

## 2015-11-05 LAB — CBC
HCT: 34 % — ABNORMAL LOW (ref 39.0–52.0)
HEMOGLOBIN: 10.8 g/dL — AB (ref 13.0–17.0)
MCH: 28.4 pg (ref 26.0–34.0)
MCHC: 31.8 g/dL (ref 30.0–36.0)
MCV: 89.5 fL (ref 78.0–100.0)
PLATELETS: 184 10*3/uL (ref 150–400)
RBC: 3.8 MIL/uL — ABNORMAL LOW (ref 4.22–5.81)
RDW: 14.3 % (ref 11.5–15.5)
WBC: 6.7 10*3/uL (ref 4.0–10.5)

## 2015-11-05 LAB — BASIC METABOLIC PANEL
ANION GAP: 8 (ref 5–15)
BUN: 12 mg/dL (ref 6–20)
CHLORIDE: 105 mmol/L (ref 101–111)
CO2: 28 mmol/L (ref 22–32)
Calcium: 8.7 mg/dL — ABNORMAL LOW (ref 8.9–10.3)
Creatinine, Ser: 1.12 mg/dL (ref 0.61–1.24)
Glucose, Bld: 156 mg/dL — ABNORMAL HIGH (ref 65–99)
POTASSIUM: 3.3 mmol/L — AB (ref 3.5–5.1)
SODIUM: 141 mmol/L (ref 135–145)

## 2015-11-05 LAB — GLUCOSE, CAPILLARY
GLUCOSE-CAPILLARY: 138 mg/dL — AB (ref 65–99)
GLUCOSE-CAPILLARY: 184 mg/dL — AB (ref 65–99)
Glucose-Capillary: 181 mg/dL — ABNORMAL HIGH (ref 65–99)
Glucose-Capillary: 161 mg/dL — ABNORMAL HIGH (ref 65–99)

## 2015-11-05 MED ORDER — PREDNISONE 50 MG PO TABS
50.0000 mg | ORAL_TABLET | Freq: Four times a day (QID) | ORAL | Status: AC
  Administered 2015-11-05 – 2015-11-06 (×3): 50 mg via ORAL
  Filled 2015-11-05 (×3): qty 1

## 2015-11-05 MED ORDER — RAMIPRIL 2.5 MG PO CAPS
10.0000 mg | ORAL_CAPSULE | Freq: Every day | ORAL | Status: DC
  Filled 2015-11-05: qty 4

## 2015-11-05 MED ORDER — DIPHENHYDRAMINE HCL 25 MG PO CAPS
50.0000 mg | ORAL_CAPSULE | Freq: Once | ORAL | Status: AC
  Administered 2015-11-06: 50 mg via ORAL
  Filled 2015-11-05: qty 2

## 2015-11-05 NOTE — Consult Note (Signed)
   [facility] CM Inpatient Consult   11/05/2015  CHASIN FINDLING [birth year] [id number]  Patient is currently active with [facility] for chronic disease management services.  Patient has been engaged by a [employer] and CSW.  Chart review reveals GILLIAN KLUEVER is a 76 y.o. male presenting with perirectal pain. Started 3 days ago. Patient states he has a history of hemorrhoids and thought that this was simply hemorrhoidal flare. Used OTC topical creams without benefit. Pain initially dull and mild but now severe constant. Worse with sitting and certain movements. Bloody drainage noted this morning. Denies any fevers, chills, chest pain, abdominal pain, nausea, vomiting, dysuria, frequency, back pain, rash. Bloody stools noted with recent bowel movement. Patient recently underwent cardiac catheterization with stent placement. Pt on Eliquis.  Active consent on file.  Met with patient and wife at bedside.  Patient endorses ongoing follow up with [facility] services.  Our community based plan of care has focused on disease management and community resource support.  Patient will receive a post discharge transition of care call and will be evaluated for monthly home visits for assessments and disease process education.  Made Inpatient Case Manager aware that [facility] following. Of note, [facility] services does not replace or interfere with any services that are needed or arranged by inpatient case management or social work.  For additional questions or referrals please contact:  Natividad Brood, RN BSN [facility] Liaison  [phone] business mobile phone Toll free office [phone]

## 2015-11-05 NOTE — Progress Notes (Signed)
PROGRESS NOTE    Jason Fisher  [id number] DOB: Dec 20, 1940 DOA: 11/04/2015 PCP: Nyoka Cowden, MD   Brief Narrative:  Jason Fisher is a pleasant 74 year old gentlman with a past medical history of hypertension, dyslipidemia, diabetes mellitus, admitted to the medicine service on 11/04/2015 when he presented with complaints of anal pain associated with bloody/purulent drainage. Findings concerning for perirectal abscess for which general surgery was consulted. He had been anticoagulated with Eliquis, which was stopped on admission. Incision and drainage planned for 11/06/2015. He remains on empiric IV antibiotic therapy with Zosyn. CT scan of lungs showing multiple speak with the lower lobe lung nodules as well  Assessment & Plan:   Principal Problem:   Perirectal abscess Active Problems:   Dyslipidemia   Essential hypertension   MYOCARDIAL INFARCTION, HX OF   Diastolic congestive heart failure (HCC)   Paroxysmal atrial fibrillation (HCC)   Diabetes mellitus with complication (HCC)   Lung nodules   Coronary artery disease of bypass graft of native heart with stable angina pectoris ([place])  1.  Perirectal abscess -Jason Fisher presenting with purulent/bloody discharge from perirectal region, evaluated by general surgery, found to have abscess and rectum. - Eliquis was stopped -Gen. surgery plans to perform incision and drainage on 11/05/2015 -Continue Zosyn  2.  Spiculated lung nodules. -CT scan of abdomen and pelvis performed on admission revealed multiple spiculated lower lobe lung nodules. Radiology reported that the appearance was suspicious for bronchogenic carcinoma although bronchiectasis could have a similar appearance. -Will further workup with a dedicated CT scan of chest with IV contrast. He has history of contrast allergy, will pre-medicate prior to the administration of IV contrast. Case discussed with radiology.  3.  History of coronary artery disease -Status  post percutaneous intervention to RCA in 2007 and 2009 with recent stenting to OM in August 2017, had been on Plavix -He was seen and evaluated by cardiology -Holding Eliquis for 48 hours prior to procedure and plan to resume postoperatively. Continuing Plavix monotherapy.  4.  Abdominal aortic aneurysm. -CT scan showed aneurysm measuring 5.2 cm in diameter -He will need outpatient vascular surgery follow-up  5.  Diastolic congestive heart failure -Last transthoracic echocardiogram performed on 09/27/2015 that showed preserved ejection fraction 55-60% -On exam appears euvolemic -Remains on Lasix 40 mg by mouth daily  6.  Hypertension -Continue metoprolol 100 mg by mouth twice a day and diltiazem 240 mg by mouth daily. Pressures controlled  7.  History of A. Fib -Continue beta blocker and calcium channel blocker  8.  Type 2 diabetes mellitus -Sliding scale coverage   DVT prophylaxis: SCDs Code Status: Ful Code Family Communication:  Disposition Plan:   Consultants:   Cardiology  Gen Surgery  Procedures:     Antimicrobials:   Zosyn   Subjective: He states feeling a little better today.  Objective: Vitals:   11/04/15 2228 11/05/15 0629 11/05/15 1352 11/05/15 1446  BP: (!) 157/95 (!) 150/88 106/88 126/74  Pulse: (!) 113 88 69 68  Resp:    18  Temp:  97.8 F (36.6 C)    TempSrc:  Oral    SpO2:  94%  98%  Weight:      Height:        Intake/Output Summary (Last 24 hours) at 11/05/15 1534 Last data filed at 11/05/15 1401  Gross per 24 hour  Intake              460 ml  Output  1901 ml  Net            -1441 ml   Filed Weights   11/04/15 0639  Weight: 87.5 kg (193 lb)    Examination:  General exam: Appears calm and comfortable, Nontoxic-appearing Respiratory system: Clear to auscultation. Respiratory effort normal. Cardiovascular system: S1 & S2 heard, RRR. No JVD, murmurs, rubs, gallops or clicks. No pedal edema. Gastrointestinal system:  Abdomen is nondistended, soft and nontender. No organomegaly or masses felt. Normal bowel sounds heard. Central nervous system: Alert and oriented. No focal neurological deficits. Extremities: Symmetric 5 x 5 power. Skin: No rashes, lesions or ulcers Psychiatry: Judgement and insight appear normal. Mood & affect appropriate.     Data Reviewed: I have personally reviewed following labs and imaging studies  CBC:  Recent Labs Lab 11/04/15 0706 11/05/15 0501  WBC 11.7* 6.7  NEUTROABS 7.9*  --   HGB 11.9* 10.8*  HCT 36.4* 34.0*  MCV 88.3 89.5  PLT 212 [id number]   Basic Metabolic Panel:  Recent Labs Lab 11/04/15 0718 11/05/15 0501  NA 140 141  K 4.3 3.3*  CL 104 105  CO2 24 28  GLUCOSE 182* 156*  BUN 17 12  CREATININE 1.20 1.12  CALCIUM 9.1 8.7*   GFR: Estimated Creatinine Clearance: 62.2 mL/min (by C-G formula based on SCr of 1.12 mg/dL). Liver Function Tests:  Recent Labs Lab 11/04/15 0718  AST 41  ALT 20  ALKPHOS 66  BILITOT 1.5*  PROT 7.0  ALBUMIN 3.6   No results for input(s): LIPASE, AMYLASE in the last 168 hours. No results for input(s): AMMONIA in the last 168 hours. Coagulation Profile: No results for input(s): INR, PROTIME in the last 168 hours. Cardiac Enzymes:  Recent Labs Lab 11/04/15 1821  CKTOTAL 34*   BNP (last 3 results) No results for input(s): PROBNP in the last 8760 hours. HbA1C: No results for input(s): HGBA1C in the last 72 hours. CBG:  Recent Labs Lab 11/04/15 1744 11/04/15 2222 11/05/15 0746 11/05/15 1200  GLUCAP 92 176* 138* 184*   Lipid Profile: No results for input(s): CHOL, HDL, LDLCALC, TRIG, CHOLHDL, LDLDIRECT in the last 72 hours. Thyroid Function Tests: No results for input(s): TSH, T4TOTAL, FREET4, T3FREE, THYROIDAB in the last 72 hours. Anemia Panel: No results for input(s): VITAMINB12, FOLATE, FERRITIN, TIBC, IRON, RETICCTPCT in the last 72 hours. Sepsis Labs:  Recent Labs Lab 11/04/15 0732 11/04/15 1821    LATICACIDVEN 2.52* 2.4*    Recent Results (from the past 240 hour(s))  Culture, blood (routine x 2)     Status: None (Preliminary result)   Collection Time: 11/04/15  6:25 PM  Result Value Ref Range Status   Specimen Description BLOOD RIGHT ANTECUBITAL  Final   Special Requests BOTTLES DRAWN AEROBIC AND ANAEROBIC 6CC  Final   Culture NO GROWTH < 24 HOURS  Final   Report Status PENDING  Incomplete  Culture, blood (routine x 2)     Status: None (Preliminary result)   Collection Time: 11/04/15  6:30 PM  Result Value Ref Range Status   Specimen Description BLOOD LEFT ANTECUBITAL  Final   Special Requests BOTTLES DRAWN AEROBIC AND ANAEROBIC 10CC  Final   Culture NO GROWTH < 24 HOURS  Final   Report Status PENDING  Incomplete         Radiology Studies: Ct Abdomen Pelvis Wo Contrast  Addendum Date: 11/04/2015   ADDENDUM REPORT: 11/04/2015 11:20 ADDENDUM: Study discussed by telephone with Dr. Duffy Bruce on 11/04/2015 At 1113  hours. He advised that on rectal exam there was a mix of bloody and purulent discharge. He suspects an abscess at the level of the anal verge, which these images probably do not get low enough to the demonstrate. If he sends the patient back for additional CT images through the perineum then I will report those on another addendum. Electronically Signed   By: Genevie Ann M.D.   On: 11/04/2015 11:20   Result Date: 11/04/2015 CLINICAL DATA:  75 year old male with rectal pain, not responding to his usual hemorrhoid treatment. Initial encounter. EXAM: CT ABDOMEN AND PELVIS WITHOUT CONTRAST TECHNIQUE: Multidetector CT imaging of the abdomen and pelvis was performed following the standard protocol without IV contrast. COMPARISON:  CTA abdomen and pelvis 10/12/2007. FINDINGS: Lower chest: Mild cardiomegaly.  No pericardial or pleural effusion. 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 However,  there is also a 2 cm spiculated lung nodule at the right lower lobe on series 3, image 8 (and coronal image 89) with retractions of the surrounding lung parenchyma. There are smaller spiculated nodules also in the right middle lobe (series 3, image 4) right medial costophrenic angle (image 10) and left medial costophrenic angle (image 12). There are several additional distal peribronchial small nodules also in the left lower lobe (images 5 and 7). No upper abdominal free air or free fluid. Hepatobiliary: Mildly increased lobulation of the liver and suggestion of some interval left lobe hypertrophy. Several calcified granulomas in the liver, no other discrete liver lesion in the absence of IV contrast. Negative gallbladder. Pancreas: Negative. Spleen: Negative. (Punctate calcified granulomas occasionally noted). Adrenals/Urinary Tract: Negative adrenal glands. Right midpole nephrolithiasis measuring 6 mm. No hydronephrosis. No hydroureter or periureteral stranding. Chronic right renal lower pole cyst with simple fluid densitometry. Unremarkable urinary bladder. Stomach/Bowel: Negative rectum. No perirectal stranding. Mild gas and stool distending the rectum. The entire perineum is not included. Mildly redundant sigmoid colon with mild to moderate diverticulosis but no active inflammation. Occasional diverticulum left colon with mild retained stool. Oral contrast has reached the mid transverse colon. Redundant right colon. Negative appendix. Negative terminal ileum. No dilated small bowel. Decompressed stomach and duodenum. Vascular/Lymphatic: Chronic but progressed distal abdominal aorta fusiform aneurysm measuring up to 52 mm diameter (series 2, image 51 and coronal image 88). This was 36 mm in 2009. There is no evidence of aneurysm rupture. Extensive superimposed Aortoiliac calcified atherosclerosis noted. No IV contrast administered. No lymphadenopathy. Reproductive: Partially visible small bilateral hydroceles.  Other: Small fat containing bilateral inguinal hernias. No pelvic free fluid. Musculoskeletal: Osteopenia.  No acute or suspicious osseous lesion. IMPRESSION: 1. No abnormality which definitely explains acute rectal pain, but multiple abnormalities which require further follow-up as follows: 2. Progressed infrarenal abdominal aortic aneurysm now up to 52 mm diameter. No evidence of rupture. Recommend followup by abdomen and pelvis CTA in 3-6 months, and Vascular Surgery Referral/consultation if not already obtained. This recommendation follows ACR consensus guidelines: White Paper of the ACR Incidental Findings Committee II on Vascular Findings. J Am Coll Radiol 2013; 10:789-794. 3. Multiple spiculated lower lobe lung nodules, the largest is 2 cm in the right lower lobe. Underlying lower lobe Bronchiectasis such that these could be inflammatory, however, the appearance is suspicious for bronchogenic carcinoma. PET-CT evaluation would likely be most valuable at this point. 4. Mildly lobular appearance of the liver with suggestion of some left lobe hypertrophy since 2009, raising the possibility of developing  cirrhosis. Electronically Signed: By: Genevie Ann M.D. On: 11/04/2015 11:08        Scheduled Meds: . atorvastatin  20 mg Oral Daily  . clopidogrel  75 mg Oral Daily  . diltiazem  240 mg Oral Daily  . furosemide  40 mg Oral Daily  . insulin aspart  0-5 Units Subcutaneous QHS  . insulin aspart  0-9 Units Subcutaneous TID WC  . metoprolol  100 mg Oral BID  . piperacillin-tazobactam (ZOSYN)  IV  3.375 g Intravenous Q8H  . sodium chloride flush  3 mL Intravenous Q12H   Continuous Infusions: . sodium chloride 1,000 mL (11/05/15 1352)     LOS: 1 day    Time spent: 35 min    Kelvin Cellar, MD [facility] Pager [phone]  If 7PM-7AM, please contact night-coverage [email or web address] Password [email or web address] 11/05/2015, 3:34 PM

## 2015-11-05 NOTE — Progress Notes (Signed)
Vitals:   11/04/15 1610 11/04/15 2147 11/04/15 2228 11/05/15 0629  BP: (!) 154/86 140/78 (!) 157/95 (!) 150/88  Pulse: (!) 102 87 (!) 113 88  Resp:  18    Temp: 97.3 F (36.3 C) 97.9 F (36.6 C)  97.8 F (36.6 C)  TempSrc: Oral Oral  Oral  SpO2: 96% 94%  94%  Weight:      Height:        Intake/Output Summary (Last 24 hours) at 11/05/15 1304 Last data filed at 11/05/15 0952  Gross per 24 hour  Intake                0 ml  Output              301 ml  Net             -301 ml   Filed Weights   11/04/15 0639  Weight: 193 lb (87.5 kg)    Subjective  No chest pain or SOB. Still has some rectal pain.  Marland Kitchen atorvastatin  20 mg Oral Daily  . clopidogrel  75 mg Oral Daily  . diltiazem  240 mg Oral Daily  . furosemide  40 mg Oral Daily  . insulin aspart  0-5 Units Subcutaneous QHS  . insulin aspart  0-9 Units Subcutaneous TID WC  . metoprolol  100 mg Oral BID  . piperacillin-tazobactam (ZOSYN)  IV  3.375 g Intravenous Q8H  . ramipril  10 mg Oral Daily  . sodium chloride flush  3 mL Intravenous Q12H   . sodium chloride 1,000 mL (11/04/15 1748)    LABS: Basic Metabolic Panel:  Recent Labs  11/04/15 0718 11/05/15 0501  NA 140 141  K 4.3 3.3*  CL 104 105  CO2 24 28  GLUCOSE 182* 156*  BUN 17 12  CREATININE 1.20 1.12  CALCIUM 9.1 8.7*   Liver Function Tests:  Recent Labs  11/04/15 0718  AST 41  ALT 20  ALKPHOS 66  BILITOT 1.5*  PROT 7.0  ALBUMIN 3.6   No results for input(s): LIPASE, AMYLASE in the last 72 hours. CBC:  Recent Labs  11/04/15 0706 11/05/15 0501  WBC 11.7* 6.7  NEUTROABS 7.9*  --   HGB 11.9* 10.8*  HCT 36.4* 34.0*  MCV 88.3 89.5  PLT 212 184   Cardiac Enzymes:  Recent Labs  11/04/15 1821  CKTOTAL 34*   BNP: No results for input(s): PROBNP in the last 72 hours. D-Dimer: No results for input(s): DDIMER in the last 72 hours. Hemoglobin A1C: No results for input(s): HGBA1C in the last 72 hours. Fasting Lipid Panel: No  results for input(s): CHOL, HDL, LDLCALC, TRIG, CHOLHDL, LDLDIRECT in the last 72 hours. Thyroid Function Tests: No results for input(s): TSH, T4TOTAL, T3FREE, THYROIDAB in the last 72 hours.  Invalid input(s): FREET3   Radiology/Studies:  Ct Abdomen Pelvis Wo Contrast  Addendum Date: 11/04/2015   ADDENDUM REPORT: 11/04/2015 11:20 ADDENDUM: Study discussed by telephone with Dr. Duffy Bruce on 11/04/2015 At 1113 hours. He advised that on rectal exam there was a mix of bloody and purulent discharge. He suspects an abscess at the level of the anal verge, which these images probably do not get low enough to the demonstrate. If he sends the patient back for additional CT images through the perineum then I will report those on another addendum. Electronically Signed   By: Genevie Ann M.D.   On: 11/04/2015 11:20   Result Date: 11/04/2015 CLINICAL DATA:  75 year old male with rectal pain, not responding to his usual hemorrhoid treatment. Initial encounter. EXAM: CT ABDOMEN AND PELVIS WITHOUT CONTRAST TECHNIQUE: Multidetector CT imaging of the abdomen and pelvis was performed following the standard protocol without IV contrast. COMPARISON:  CTA abdomen and pelvis 10/12/2007. FINDINGS: Lower chest: Mild cardiomegaly.  No pericardial or pleural effusion. 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 However, there is also a 2 cm spiculated lung nodule at the right lower lobe on series 3, image 8 (and coronal image 89) with retractions of the surrounding lung parenchyma. There are smaller spiculated nodules also in the right middle lobe (series 3, image 4) right medial costophrenic angle (image 10) and left medial costophrenic angle (image 12). There are several additional distal peribronchial small nodules also in the left lower lobe (images 5 and 7). No upper abdominal free air or free fluid. Hepatobiliary: Mildly increased  lobulation of the liver and suggestion of some interval left lobe hypertrophy. Several calcified granulomas in the liver, no other discrete liver lesion in the absence of IV contrast. Negative gallbladder. Pancreas: Negative. Spleen: Negative. (Punctate calcified granulomas occasionally noted). Adrenals/Urinary Tract: Negative adrenal glands. Right midpole nephrolithiasis measuring 6 mm. No hydronephrosis. No hydroureter or periureteral stranding. Chronic right renal lower pole cyst with simple fluid densitometry. Unremarkable urinary bladder. Stomach/Bowel: Negative rectum. No perirectal stranding. Mild gas and stool distending the rectum. The entire perineum is not included. Mildly redundant sigmoid colon with mild to moderate diverticulosis but no active inflammation. Occasional diverticulum left colon with mild retained stool. Oral contrast has reached the mid transverse colon. Redundant right colon. Negative appendix. Negative terminal ileum. No dilated small bowel. Decompressed stomach and duodenum. Vascular/Lymphatic: Chronic but progressed distal abdominal aorta fusiform aneurysm measuring up to 52 mm diameter (series 2, image 51 and coronal image 88). This was 36 mm in 2009. There is no evidence of aneurysm rupture. Extensive superimposed Aortoiliac calcified atherosclerosis noted. No IV contrast administered. No lymphadenopathy. Reproductive: Partially visible small bilateral hydroceles. Other: Small fat containing bilateral inguinal hernias. No pelvic free fluid. Musculoskeletal: Osteopenia.  No acute or suspicious osseous lesion. IMPRESSION: 1. No abnormality which definitely explains acute rectal pain, but multiple abnormalities which require further follow-up as follows: 2. Progressed infrarenal abdominal aortic aneurysm now up to 52 mm diameter. No evidence of rupture. Recommend followup by abdomen and pelvis CTA in 3-6 months, and Vascular Surgery Referral/consultation if not already obtained. This  recommendation follows ACR consensus guidelines: White Paper of the ACR Incidental Findings Committee II on Vascular Findings. J Am Coll Radiol 2013; 10:789-794. 3. Multiple spiculated lower lobe lung nodules, the largest is 2 cm in the right lower lobe. Underlying lower lobe Bronchiectasis such that these could be inflammatory, however, the appearance is suspicious for bronchogenic carcinoma. PET-CT evaluation would likely be most valuable at this point. 4. Mildly lobular appearance of the liver with suggestion of some left lobe hypertrophy since 2009, raising the possibility of developing cirrhosis. Electronically Signed: By: Genevie Ann M.D. On: 11/04/2015 11:08    PHYSICAL EXAM GEN: Well nourished, well developed, in no acute distress.  HEENT: normal.  Neck: Supple, no JVD, carotid bruits, or masses. Cardiac: RRR, no murmurs, rubs, or gallops. No clubbing, cyanosis, edema. Radials/DP/PT 2+ and equal bilaterally.  Respiratory: Respirations regular and unlabored. Diminished breath sounds bilaterally. GI: Soft, nontender, nondistended, BS + x 4. MS: no deformity or atrophy. Skin: warm and dry, no rash.  Neuro: Strength and sensation are intact. Psych: Normal affect.   ASSESSMENT AND PLAN: 1. Perirectal abscess 2. Thrombosed hemorrhoid 3. CAD s/p remote stenting of the RCA in 2007 and 2009. More recent stenting of the OM on September 26 2015. He had a Biofreedom stent in which the polymer matrix dissolves and basically becomes a BMS after one month. Now on Plavix only for antiplatelet therapy 4. Chronic atrial fibrillation. Rate controlled. On chronic Eliquis for stroke risk reduction. 5. AAA 5.2 cm. This has increased in size from 2009 when it was 3.6 cm by CT. By ultrasound last ultrasound showed dimension of 4.4 cm in December 2016.  6. Lower lung nodules.  7. HTN 8. Hyperlipidemia 9. Tobacco abuse. 10. CKD stage 3  Plan: OK to hold Eliquis for 48 hours prior to surgery. This can be  resumed post op. I would continue Plavix monotherapy for now.  Since risk of major bleeding with lancing of abscess is low I would favor continuing Plavix. I would not stop all antiplatelet therapy with a fairly fresh coronary stent. Can resume Eliquis post op. Will need PET CT as outpatient to evaluate lung nodules. Will also need referral to vascular surgery as outpatient for evaluation of AAA. Please call with further questions.  Present on Admission: . Perirectal abscess . Paroxysmal atrial fibrillation (HCC) . Essential hypertension . Dyslipidemia . Diastolic congestive heart failure ([place])   Signed, Peter Martinique, [place] 11/05/2015 1:04 PM

## 2015-11-05 NOTE — Progress Notes (Signed)
   11/05/15 1352  Vitals  BP 106/88  MAP (mmHg) 92  BP Location Right Arm  BP Method Automatic  Patient Position (if appropriate) Sitting  Pulse Rate 69   Pt. Scheduled for Ramipril 10mg , Text paged Dr. Coralyn Pear to see if it's ok to give.  Will continue to monitor and await for return call or new orders.  Alphonzo Lemmings, RN

## 2015-11-05 NOTE — Progress Notes (Signed)
S: Feeling much better.   Vitals, labs, intake/output, and orders reviewed at this time.  Gen: A&Ox3, no distress  H&N: EOMI, atraumatic, neck supple Chest: unlabored respirations, RRR Abd: soft, nontender, nondistended Ext: warm, no edema Neuro: grossly normal Rectum: posterior to left of midline 1-2cm abscess, starting to express a small amount of thick pus, no surrounding cellulitis; tender  Lines/tubes/drains: PIV  A/P:  Perianal abscess. Likely I&D tomorrow- doesn't look like he got any eliquis yesterday. Last plavix was last nignt.    Romana Juniper, MD [facility] Surgery, [place] Pager [phone]

## 2015-11-05 NOTE — Progress Notes (Signed)
Patient ID: Jason Fisher, male   DOB: Jul 25, 1940, 75 y.o.   MRN: [id number]   Plan OR tomorrow for I and D of perirectal abscess and possible hemorrhoidectomy.  I discussed the risks with him including bleeding.  Her agrees to proceed.

## 2015-11-06 ENCOUNTER — Inpatient Hospital Stay (HOSPITAL_COMMUNITY): Payer: Medicare Other

## 2015-11-06 ENCOUNTER — Inpatient Hospital Stay (HOSPITAL_COMMUNITY): Payer: Medicare Other | Admitting: Anesthesiology

## 2015-11-06 ENCOUNTER — Encounter (HOSPITAL_COMMUNITY): Payer: Self-pay | Admitting: Radiology

## 2015-11-06 ENCOUNTER — Encounter (HOSPITAL_COMMUNITY)
Admission: EM | Disposition: A | Discharge status: Home Health | Payer: Self-pay | Source: Home / Self Care | Attending: Internal Medicine

## 2015-11-06 HISTORY — PX: INCISION AND DRAINAGE PERIRECTAL ABSCESS: SHX1804

## 2015-11-06 LAB — GLUCOSE, CAPILLARY
GLUCOSE-CAPILLARY: 234 mg/dL — AB (ref 65–99)
GLUCOSE-CAPILLARY: 212 mg/dL — AB (ref 65–99)
GLUCOSE-CAPILLARY: 288 mg/dL — AB (ref 65–99)
Glucose-Capillary: 198 mg/dL — ABNORMAL HIGH (ref 65–99)
Glucose-Capillary: 201 mg/dL — ABNORMAL HIGH (ref 65–99)
Glucose-Capillary: 307 mg/dL — ABNORMAL HIGH (ref 65–99)

## 2015-11-06 LAB — CBC
HCT: 40.2 % (ref 39.0–52.0)
Hemoglobin: 13 g/dL (ref 13.0–17.0)
MCH: 28.8 pg (ref 26.0–34.0)
MCHC: 32.3 g/dL (ref 30.0–36.0)
MCV: 88.9 fL (ref 78.0–100.0)
PLATELETS: 215 10*3/uL (ref 150–400)
RBC: 4.52 MIL/uL (ref 4.22–5.81)
RDW: 14.1 % (ref 11.5–15.5)
WBC: 8 10*3/uL (ref 4.0–10.5)

## 2015-11-06 LAB — BASIC METABOLIC PANEL
Anion gap: 11 (ref 5–15)
BUN: 14 mg/dL (ref 6–20)
CHLORIDE: 108 mmol/L (ref 101–111)
CO2: 22 mmol/L (ref 22–32)
CREATININE: 1.32 mg/dL — AB (ref 0.61–1.24)
Calcium: 9.3 mg/dL (ref 8.9–10.3)
GFR calc Af Amer: 60 mL/min — ABNORMAL LOW (ref >60)
GFR calc non Af Amer: 51 mL/min — ABNORMAL LOW (ref >60)
GLUCOSE: 239 mg/dL — AB (ref 65–99)
Potassium: 4.2 mmol/L (ref 3.5–5.1)
Sodium: 141 mmol/L (ref 135–145)

## 2015-11-06 LAB — HEMOGLOBIN A1C
Hgb A1c MFr Bld: 6.6 % — ABNORMAL HIGH (ref 4.8–5.6)
MEAN PLASMA GLUCOSE: 143 mg/dL

## 2015-11-06 LAB — SURGICAL PCR SCREEN
MRSA, PCR: NEGATIVE
STAPHYLOCOCCUS AUREUS: NEGATIVE

## 2015-11-06 SURGERY — INCISION AND DRAINAGE, ABSCESS, PERIRECTAL
Anesthesia: General | Site: Rectum

## 2015-11-06 MED ORDER — LIDOCAINE 2% (20 MG/ML) 5 ML SYRINGE
INTRAMUSCULAR | Status: DC | PRN
  Administered 2015-11-06: 60 mg via INTRAVENOUS

## 2015-11-06 MED ORDER — IOPAMIDOL (ISOVUE-300) INJECTION 61%
INTRAVENOUS | Status: AC
  Administered 2015-11-06: 75 mL
  Filled 2015-11-06: qty 75

## 2015-11-06 MED ORDER — FENTANYL CITRATE (PF) 100 MCG/2ML IJ SOLN
INTRAMUSCULAR | Status: DC | PRN
  Administered 2015-11-06 (×2): 50 ug via INTRAVENOUS

## 2015-11-06 MED ORDER — HEMOSTATIC AGENTS (NO CHARGE) OPTIME
TOPICAL | Status: DC | PRN
  Administered 2015-11-06: 1 via TOPICAL

## 2015-11-06 MED ORDER — OXYCODONE HCL 5 MG PO TABS
5.0000 mg | ORAL_TABLET | ORAL | Status: DC | PRN
  Administered 2015-11-06: 10 mg via ORAL
  Filled 2015-11-06: qty 2

## 2015-11-06 MED ORDER — FENTANYL CITRATE (PF) 100 MCG/2ML IJ SOLN
INTRAMUSCULAR | Status: AC
Start: 2015-11-06 — End: 2015-11-06
  Filled 2015-11-06: qty 2

## 2015-11-06 MED ORDER — PROPOFOL 10 MG/ML IV BOLUS
INTRAVENOUS | Status: DC | PRN
  Administered 2015-11-06: 150 mg via INTRAVENOUS

## 2015-11-06 MED ORDER — ONDANSETRON HCL 4 MG/2ML IJ SOLN
INTRAMUSCULAR | Status: DC | PRN
  Administered 2015-11-06: 4 mg via INTRAVENOUS

## 2015-11-06 MED ORDER — BUPIVACAINE LIPOSOME 1.3 % IJ SUSP
INTRAMUSCULAR | Status: DC | PRN
  Administered 2015-11-06: 10 mL

## 2015-11-06 MED ORDER — LACTATED RINGERS IV SOLN
INTRAVENOUS | Status: DC | PRN
  Administered 2015-11-06: 11:00:00 via INTRAVENOUS

## 2015-11-06 MED ORDER — PROPOFOL 10 MG/ML IV BOLUS
INTRAVENOUS | Status: AC
  Filled 2015-11-06: qty 20

## 2015-11-06 MED ORDER — 0.9 % SODIUM CHLORIDE (POUR BTL) OPTIME
TOPICAL | Status: DC | PRN
  Administered 2015-11-06: 1000 mL

## 2015-11-06 MED ORDER — BUPIVACAINE LIPOSOME 1.3 % IJ SUSP
20.0000 mL | Freq: Once | INTRAMUSCULAR | Status: DC
  Filled 2015-11-06 (×2): qty 20

## 2015-11-06 SURGICAL SUPPLY — 31 items
CANISTER SUCTION 2500CC (MISCELLANEOUS) ×3 IMPLANT
COVER SURGICAL LIGHT HANDLE (MISCELLANEOUS) ×3 IMPLANT
DRAPE UTILITY XL STRL (DRAPES) ×6 IMPLANT
DRSG PAD ABDOMINAL 8X10 ST (GAUZE/BANDAGES/DRESSINGS) ×3 IMPLANT
ELECT CAUTERY BLADE 6.4 (BLADE) ×3 IMPLANT
ELECT REM PT RETURN 9FT ADLT (ELECTROSURGICAL) ×3
ELECTRODE REM PT RTRN 9FT ADLT (ELECTROSURGICAL) ×1 IMPLANT
GAUZE SPONGE 4X4 12PLY STRL (GAUZE/BANDAGES/DRESSINGS) IMPLANT
GAUZE SPONGE 4X4 16PLY XRAY LF (GAUZE/BANDAGES/DRESSINGS) ×3 IMPLANT
GLOVE SURG SIGNA 7.5 PF LTX (GLOVE) ×3 IMPLANT
GOWN STRL REUS W/ TWL LRG LVL3 (GOWN DISPOSABLE) ×1 IMPLANT
GOWN STRL REUS W/ TWL XL LVL3 (GOWN DISPOSABLE) ×1 IMPLANT
GOWN STRL REUS W/TWL LRG LVL3 (GOWN DISPOSABLE) ×3
GOWN STRL REUS W/TWL XL LVL3 (GOWN DISPOSABLE) ×3
KIT BASIN OR (CUSTOM PROCEDURE TRAY) ×3 IMPLANT
KIT ROOM TURNOVER OR (KITS) ×3 IMPLANT
NS IRRIG 1000ML POUR BTL (IV SOLUTION) ×3 IMPLANT
PACK LITHOTOMY IV (CUSTOM PROCEDURE TRAY) ×3 IMPLANT
PAD ARMBOARD 7.5X6 YLW CONV (MISCELLANEOUS) ×3 IMPLANT
PENCIL BUTTON HOLSTER BLD 10FT (ELECTRODE) ×3 IMPLANT
SOL PREP POV-IOD 4OZ 10% (MISCELLANEOUS) ×3 IMPLANT
SPONGE LAP 18X18 X RAY DECT (DISPOSABLE) IMPLANT
SWAB COLLECTION DEVICE MRSA (MISCELLANEOUS) IMPLANT
SYR BULB 3OZ (MISCELLANEOUS) ×3 IMPLANT
TOWEL OR 17X24 6PK STRL BLUE (TOWEL DISPOSABLE) ×3 IMPLANT
TOWEL OR 17X26 10 PK STRL BLUE (TOWEL DISPOSABLE) IMPLANT
TUBE ANAEROBIC SPECIMEN COL (MISCELLANEOUS) IMPLANT
TUBE CONNECTING 12'X1/4 (SUCTIONS) ×1
TUBE CONNECTING 12X1/4 (SUCTIONS) ×2 IMPLANT
UNDERPAD 30X30 (UNDERPADS AND DIAPERS) ×3 IMPLANT
YANKAUER SUCT BULB TIP NO VENT (SUCTIONS) ×3 IMPLANT

## 2015-11-06 NOTE — Anesthesia Preprocedure Evaluation (Addendum)
Anesthesia Evaluation  Patient identified by MRN, date of birth, ID band Patient awake    Reviewed: Allergy & Precautions, NPO status , Patient's Chart, lab work & pertinent test results, reviewed documented beta blocker date and time   Airway Mallampati: II  TM Distance: >3 FB Neck ROM: Full    Dental  (+) Dental Advisory Given   Pulmonary former smoker,    breath sounds clear to auscultation       Cardiovascular hypertension, Pt. on medications and Pt. on home beta blockers + angina + CAD, + Past MI, + Cardiac Stents, + Peripheral Vascular Disease and +CHF   Rhythm:Regular Rate:Normal  Left ventricle: The cavity size was normal. Systolic function was   normal. The estimated ejection fraction was in the range of 55%   to 60%. Wall motion was normal; there were no regional wall   motion abnormalities. Doppler parameters are consistent with   elevated mean left atrial filling pressure. - Left atrium: The atrium was mildly dilated.   Neuro/Psych negative neurological ROS     GI/Hepatic negative GI ROS, Neg liver ROS,   Endo/Other  diabetes, Type 2  Renal/GU Renal InsufficiencyRenal disease     Musculoskeletal   Abdominal   Peds  Hematology negative hematology ROS (+)   Anesthesia Other Findings   Reproductive/Obstetrics                            Lab Results  Component Value Date   WBC 8.0 11/06/2015   HGB 13.0 11/06/2015   HCT 40.2 11/06/2015   MCV 88.9 11/06/2015   PLT 215 11/06/2015   Lab Results  Component Value Date   CREATININE 1.32 (H) 11/06/2015   BUN 14 11/06/2015   NA 141 11/06/2015   K 4.2 11/06/2015   CL 108 11/06/2015   CO2 22 11/06/2015    Anesthesia Physical Anesthesia Plan  ASA: III  Anesthesia Plan: General   Post-op Pain Management:    Induction: Intravenous  Airway Management Planned: Oral ETT  Additional Equipment:   Intra-op Plan:    Post-operative Plan: Extubation in OR  Informed Consent: I have reviewed the patients History and Physical, chart, labs and discussed the procedure including the risks, benefits and alternatives for the proposed anesthesia with the patient or authorized representative who has indicated his/her understanding and acceptance.   Dental advisory given  Plan Discussed with: CRNA  Anesthesia Plan Comments:         Anesthesia Quick Evaluation

## 2015-11-06 NOTE — Progress Notes (Signed)
Patient arrived back to the unit with PACU nurse, alert and oriented x4, call bell within reach, pain and site assessed and will continue to monitor.  Betha Loa Isidoro Santillana, RN

## 2015-11-06 NOTE — Transfer of Care (Signed)
Immediate Anesthesia Transfer of Care Note  Patient: Jason Fisher  Procedure(s) Performed: Procedure(s): IRRIGATION AND DEBRIDEMENT PERIRECTAL ABSCESS, POSSIBLE HEMORRHOIDECTOMY (N/A)  Patient Location: PACU  Anesthesia Type:General  Level of Consciousness: responds to stimulation  Airway & Oxygen Therapy: Patient Spontanous Breathing and Patient connected to face mask oxygen  Post-op Assessment: Report given to RN and Post -op Vital signs reviewed and stable  Post vital signs: Reviewed and stable  Last Vitals:  Vitals:   11/06/15 0538 11/06/15 0958  BP: (!) 129/94 (!) 144/85  Pulse: 86 84  Resp: 18   Temp: 36.4 C     Last Pain:  Vitals:   11/05/15 2201  TempSrc: Oral  PainSc: 0-No pain         Complications: No apparent anesthesia complications

## 2015-11-06 NOTE — Op Note (Signed)
IRRIGATION AND DEBRIDEMENT PERIRECTAL ABSCESS, POSSIBLE HEMORRHOIDECTOMY  Procedure Note  Jason Fisher 11/04/2015 - 11/06/2015   Pre-op Diagnosis: Peri-rectal abscess     Post-op Diagnosis: same  Procedure(s): INCISION AND DRAINAGE OF PERIANAL ABSCESS, EXCISION ANAL SKIN TAG  Surgeon(s): Coralie Keens, MD  Anesthesia: General  Staff:  Circulator: Susann Givens, RN Scrub Person: Adella Hare Circulator Assistant: Rosanne Sack, RN  Estimated Blood Loss: Minimal                         Antonio Creswell A   Date: 11/06/2015  Time: 11:36 AM

## 2015-11-06 NOTE — Anesthesia Procedure Notes (Signed)
Procedure Name: LMA Insertion Date/Time: 11/06/2015 11:17 AM Performed by: Manuela Schwartz B Pre-anesthesia Checklist: Patient identified, Emergency Drugs available, Suction available, Patient being monitored and Timeout performed Patient Re-evaluated:Patient Re-evaluated prior to inductionOxygen Delivery Method: Circle system utilized Preoxygenation: Pre-oxygenation with 100% oxygen Intubation Type: IV induction LMA: LMA inserted LMA Size: 4.0 Number of attempts: 1 Placement Confirmation: positive ETCO2 and breath sounds checked- equal and bilateral Tube secured with: Tape Dental Injury: Teeth and Oropharynx as per pre-operative assessment

## 2015-11-06 NOTE — Progress Notes (Signed)
PROGRESS NOTE    Jason Fisher  [id number] DOB: March 18, 1940 DOA: 11/04/2015 PCP: Nyoka Cowden, MD   Brief Narrative:  Jason Fisher is a pleasant 75 year old gentlman with a past medical history of hypertension, dyslipidemia, diabetes mellitus, admitted to the medicine service on 11/04/2015 when he presented with complaints of anal pain associated with bloody/purulent drainage. Findings concerning for perirectal abscess for which general surgery was consulted. He had been anticoagulated with Eliquis, which was stopped on admission. Incision and drainage planned for 11/06/2015. He remains on empiric IV antibiotic therapy with Zosyn. CT scan of lungs showing multiple speak with the lower lobe lung nodules as well  Assessment & Plan:  1.  Perirectal abscess -admitted with purulent/bloody discharge from perirectal region, evaluated by general surgery, found to have abscess around rectum. - Eliquis was stopped, started on IV Zosyn - OR for I&D today - FU wound Cx  2.  Spiculated lung nodules. -CT scan of abdomen and pelvis performed on admission revealed multiple spiculated lower lobe lung nodules. Radiology reported that the appearance was suspicious for bronchogenic carcinoma. -CT chest after premedication notable for irregular nodular opacities, largest is 1.5x1.4cm concerning for multifocal neoplasm, and subcarinal and R hilar lymph nodes noted. -will request Pulm eval in am -long h/o smoking, quit 45days ago, 1PPD for approximately 50years  3.  History of coronary artery disease -Status post percutaneous intervention to RCA in 2007 and 2009 with recent stenting to OM in August 2017, had been on Plavix -He was seen and evaluated by cardiology -Holding Eliquis for 48 hours prior to procedure and plan to resume tomorrow -continue Plavix-this was never held  4.  Abdominal aortic aneurysm. -CT scan showed aneurysm measuring 5.2 cm in diameter -He will need outpatient vascular  surgery follow-up  5.  Diastolic congestive heart failure -Last transthoracic echocardiogram performed on 09/27/2015 that showed preserved ejection fraction 55-60% -On exam appears euvolemic -Remains on Lasix 40 mg by mouth daily  6.  Hypertension -Continue metoprolol 100 mg by mouth twice a day and diltiazem 240 mg by mouth daily. Pressures controlled  7.  History of A. Fib -Continue beta blocker and calcium channel blocker -resume eliquis tomorrow  8.  Type 2 diabetes mellitus -Sliding scale coverage  DVT prophylaxis: SCDs Code Status: Ful Code Family Communication:  Disposition Plan: home when improved and  by CCS  Consultants:   Cardiology  Gen Surgery  Procedures:     Antimicrobials:   Zosyn   Subjective: Just back from OR, sleepy  Objective: Vitals:   11/06/15 1200 11/06/15 1215 11/06/15 1218 11/06/15 1233  BP: 139/83 (!) 154/83  (!) 160/80  Pulse: 71 (!) 103 72 76  Resp: 18 13 15    Temp:   (!) 96.8 F (36 C)   TempSrc:      SpO2: 96% 95% 96% 93%  Weight:      Height:        Intake/Output Summary (Last 24 hours) at 11/06/15 1429 Last data filed at 11/06/15 1136  Gross per 24 hour  Intake              820 ml  Output              776 ml  Net               44 ml   Filed Weights   11/04/15 0639  Weight: 87.5 kg (193 lb)    Examination:  General exam: Appears calm and comfortable, Nontoxic-appearing Respiratory system:  Clear to auscultation. Respiratory effort normal. Cardiovascular system: S1 & S2 heard, RRR. No JVD, murmurs, rubs, gallops or clicks. No pedal edema. Gastrointestinal system: Abdomen is nondistended, soft and nontender. No organomegaly or masses felt. Normal bowel sounds heard. Central nervous system: Alert and oriented. No focal neurological deficits. Extremities: Symmetric 5 x 5 power. Skin: No rashes, lesions or ulcers Psychiatry: Judgement and insight appear normal. Mood & affect appropriate.     Data Reviewed: I have  personally reviewed following labs and imaging studies  CBC:  Recent Labs Lab 11/04/15 0706 11/05/15 0501 11/06/15 0541  WBC 11.7* 6.7 8.0  NEUTROABS 7.9*  --   --   HGB 11.9* 10.8* 13.0  HCT 36.4* 34.0* 40.2  MCV 88.3 89.5 88.9  PLT 212 184 [id number]   Basic Metabolic Panel:  Recent Labs Lab 11/04/15 0718 11/05/15 0501 11/06/15 0541  NA 140 141 141  K 4.3 3.3* 4.2  CL 104 105 108  CO2 24 28 22   GLUCOSE 182* 156* 239*  BUN 17 12 14   CREATININE 1.20 1.12 1.32*  CALCIUM 9.1 8.7* 9.3   GFR: Estimated Creatinine Clearance: 52.8 mL/min (by C-G formula based on SCr of 1.32 mg/dL (H)). Liver Function Tests:  Recent Labs Lab 11/04/15 0718  AST 41  ALT 20  ALKPHOS 66  BILITOT 1.5*  PROT 7.0  ALBUMIN 3.6   No results for input(s): LIPASE, AMYLASE in the last 168 hours. No results for input(s): AMMONIA in the last 168 hours. Coagulation Profile: No results for input(s): INR, PROTIME in the last 168 hours. Cardiac Enzymes:  Recent Labs Lab 11/04/15 1821  CKTOTAL 34*   BNP (last 3 results) No results for input(s): PROBNP in the last 8760 hours. HbA1C:  Recent Labs  11/04/15 1821  HGBA1C 6.6*   CBG:  Recent Labs Lab 11/05/15 2207 11/06/15 0827 11/06/15 0959 11/06/15 1152 11/06/15 1237  GLUCAP 161* 234* 212* 198* 201*   Lipid Profile: No results for input(s): CHOL, HDL, LDLCALC, TRIG, CHOLHDL, LDLDIRECT in the last 72 hours. Thyroid Function Tests: No results for input(s): TSH, T4TOTAL, FREET4, T3FREE, THYROIDAB in the last 72 hours. Anemia Panel: No results for input(s): VITAMINB12, FOLATE, FERRITIN, TIBC, IRON, RETICCTPCT in the last 72 hours. Sepsis Labs:  Recent Labs Lab 11/04/15 0732 11/04/15 1821  LATICACIDVEN 2.52* 2.4*    Recent Results (from the past 240 hour(s))  Culture, blood (routine x 2)     Status: None (Preliminary result)   Collection Time: 11/04/15  6:25 PM  Result Value Ref Range Status   Specimen Description BLOOD RIGHT  ANTECUBITAL  Final   Special Requests BOTTLES DRAWN AEROBIC AND ANAEROBIC 6CC  Final   Culture NO GROWTH < 24 HOURS  Final   Report Status PENDING  Incomplete  Culture, blood (routine x 2)     Status: None (Preliminary result)   Collection Time: 11/04/15  6:30 PM  Result Value Ref Range Status   Specimen Description BLOOD LEFT ANTECUBITAL  Final   Special Requests BOTTLES DRAWN AEROBIC AND ANAEROBIC 10CC  Final   Culture NO GROWTH < 24 HOURS  Final   Report Status PENDING  Incomplete  Surgical PCR screen     Status: None   Collection Time: 11/06/15  7:20 AM  Result Value Ref Range Status   MRSA, PCR NEGATIVE NEGATIVE Final   Staphylococcus aureus NEGATIVE NEGATIVE Final    Comment:        The Xpert SA Assay (FDA approved for NASAL specimens in  patients over 7 years of age), is one component of a comprehensive surveillance program.  Test performance has been validated by [facility] for patients greater than or equal to 34 year old. It is not intended to diagnose infection nor to guide or monitor treatment.          Radiology Studies: Ct Chest W Contrast  Result Date: 11/06/2015 CLINICAL DATA:  Pulmonary nodules EXAM: CT CHEST WITH CONTRAST TECHNIQUE: Multidetector CT imaging of the chest was performed during intravenous contrast administration. CONTRAST:  35mL ISOVUE-300 IOPAMIDOL (ISOVUE-300) INJECTION 61% COMPARISON:  CT abdomen and pelvis including lung bases November 04, 2015; chest radiograph September 29, 2015 FINDINGS: Cardiovascular: Prominence in the ascending thoracic aorta is noted with a measured transverse diameter of 4.0 x 3.7 cm. The aorta is tortuous in the descending thoracic aorta without apparent aneurysmal dilatation. There is no thoracic aortic dissection. The visualized great vessels show moderate atherosclerotic calcification at the origins of the right common carotid and left subclavian artery. There is no appreciable pericardial thickening. There are  multiple foci of coronary artery calcification. No evident pulmonary embolus. Mediastinum/Nodes: Thyroid appears unremarkable. There are scattered subcentimeter mediastinal lymph nodes. There is a sub- carinal lymph node measuring 1.5 x 1.4 cm. There is a lymph node in the right hilum measuring 1.1 x 0.9 cm. Lungs/Pleura: There is somewhat irregular opacity in the superior lingula medially with a nodular appearing area measuring 1.0 x 1.0 cm, best seen on axial slice 89 series 3. This finding is also apparent on sagittal slice 82 series 5. On axial slice 0000000 series 3 and sagittal slice [id number] series 5, there is a nodular appearing opacity with subtle cavitation measuring 1.5 x 1.1 cm in the inferior lingula. There is a somewhat spiculated appearing lesion in the medial segment of the right lower lobe measuring 1.5 x 1.4 cm. This lesion is best seen on axial slice 123456 series 3. There is a somewhat irregular nodular appearing opacity in the posterior segment of the left lower lobe measuring 1.2 x 1.0 cm, best seen on axial slice [id number] series 3. There is scarring in each lung base as well. There is a nodular opacity in the posterior segment right lower lobe abutting the pleura measuring 1.0 x 0.7 cm, best seen on axial slice [id number] series 3. There is lower lobe bronchiectatic change bilaterally, slightly more severe on the right than on the left. There is no appreciable pleural effusion. There is mild left base atelectatic change. Upper Abdomen: In the visualized upper abdomen, there is atherosclerotic calcification in the aorta. Incomplete visualization of abdominal aortic aneurysm noted, described on recent CT abdomen. Liver contour is somewhat lobular suggesting a degree of underlying cirrhosis. Scattered liver granulomas noted. No new lesion identified in the upper abdomen compared to 2 days prior. Musculoskeletal: There is degenerative change in the thoracic spine. No blastic or lytic bone lesions are evident. There is  diffuse idiopathic skeletal hyperostosis. IMPRESSION: Irregular nodular opacities in the lung parenchyma as summarized above. Subtle questionable cavitation in a lesion in the inferior lingula noted. Largest nodular opacity measures 1.5 x 1.4 cm. This appearance is concerning for multifocal neoplasm. In the appropriate clinical setting, septic emboli could present similarly. There are mildly prominent sub- carinal and right hilar lymph nodes which could be either of malignant or reactive etiology. Given concern for potential multifocal neoplasm and possible lymph node involvement, nuclear medicine PET study advised to further evaluate. Ascending thoracic aorta measures 4.0 x 3.7 cm. Recommend  annual imaging followup by CTA or MRA. This recommendation follows 2010 ACCF/AHA/AATS/ACR/ASA/SCA/SCAI/SIR/STS/SVM Guidelines for the Diagnosis and Management of Patients with Thoracic Aortic Disease. Circulation. 2010; [id number]:5694362. No aortic dissection. Extensive coronary artery calcification. Calcification is noted at the proximal aspects of the right common carotid and left subclavian artery as well as calcification noted throughout the thoracic aorta. Electronically Signed   By: Lowella Grip III M.D.   On: 11/06/2015 09:31        Scheduled Meds: . atorvastatin  20 mg Oral Daily  . bupivacaine liposome  20 mL Infiltration Once  . clopidogrel  75 mg Oral Daily  . diltiazem  240 mg Oral Daily  . furosemide  40 mg Oral Daily  . insulin aspart  0-5 Units Subcutaneous QHS  . insulin aspart  0-9 Units Subcutaneous TID WC  . metoprolol  100 mg Oral BID  . piperacillin-tazobactam (ZOSYN)  IV  3.375 g Intravenous Q8H  . sodium chloride flush  3 mL Intravenous Q12H   Continuous Infusions:     LOS: 2 days    Time spent: 35 min    Janiyah Beery, MD [facility] Pager [phone]  If 7PM-7AM, please contact night-coverage [email or web address] Password [email or web address] 11/06/2015, 2:29 PM

## 2015-11-07 ENCOUNTER — Institutional Professional Consult (permissible substitution): Payer: Medicare Other | Admitting: Pulmonary Disease

## 2015-11-07 ENCOUNTER — Encounter (HOSPITAL_COMMUNITY): Payer: Self-pay | Admitting: Surgery

## 2015-11-07 ENCOUNTER — Telehealth: Payer: Self-pay | Admitting: Emergency Medicine

## 2015-11-07 LAB — BASIC METABOLIC PANEL
Anion gap: 16 — ABNORMAL HIGH (ref 5–15)
BUN: 15 mg/dL (ref 6–20)
CALCIUM: 9.4 mg/dL (ref 8.9–10.3)
CO2: 27 mmol/L (ref 22–32)
CREATININE: 1.55 mg/dL — AB (ref 0.61–1.24)
Chloride: 98 mmol/L — ABNORMAL LOW (ref 101–111)
GFR calc Af Amer: 49 mL/min — ABNORMAL LOW (ref >60)
GFR, EST NON AFRICAN AMERICAN: 42 mL/min — AB (ref >60)
GLUCOSE: 190 mg/dL — AB (ref 65–99)
Potassium: 3.6 mmol/L (ref 3.5–5.1)
SODIUM: 141 mmol/L (ref 135–145)

## 2015-11-07 LAB — GLUCOSE, CAPILLARY
GLUCOSE-CAPILLARY: 285 mg/dL — AB (ref 65–99)
GLUCOSE-CAPILLARY: 227 mg/dL — AB (ref 65–99)
Glucose-Capillary: 217 mg/dL — ABNORMAL HIGH (ref 65–99)
Glucose-Capillary: 203 mg/dL — ABNORMAL HIGH (ref 65–99)

## 2015-11-07 LAB — CBC
HCT: 40.2 % (ref 39.0–52.0)
Hemoglobin: 12.7 g/dL — ABNORMAL LOW (ref 13.0–17.0)
MCH: 28.4 pg (ref 26.0–34.0)
MCHC: 31.6 g/dL (ref 30.0–36.0)
MCV: 89.9 fL (ref 78.0–100.0)
PLATELETS: 267 10*3/uL (ref 150–400)
RBC: 4.47 MIL/uL (ref 4.22–5.81)
RDW: 14.4 % (ref 11.5–15.5)
WBC: 20.7 10*3/uL — AB (ref 4.0–10.5)

## 2015-11-07 MED ORDER — APIXABAN 5 MG PO TABS
5.0000 mg | ORAL_TABLET | Freq: Two times a day (BID) | ORAL | Status: DC
  Administered 2015-11-07 – 2015-11-08 (×3): 5 mg via ORAL
  Filled 2015-11-07 (×3): qty 1

## 2015-11-07 NOTE — Telephone Encounter (Signed)
Spoke w dr Broadus Iden. Pt has a hx tobacco, is admitted currently for peri-rectal abscess. Has CT chest with new nodular infiltrates, ? Inflammatory vs malignancy.   Needs hosp f/u OV with [place], then probably repeat CT scan vs PET scan depending on our visit.   Thanks

## 2015-11-07 NOTE — Telephone Encounter (Signed)
You do not have any available appointments until 11/6. Can the pt wait until then or do we need to double book your schedule?

## 2015-11-07 NOTE — Progress Notes (Signed)
Inpatient Diabetes Program Recommendations  AACE/ADA: New Consensus Statement on Inpatient Glycemic Control (2015)  Target Ranges:  Prepandial:   less than 140 mg/dL      Peak postprandial:   less than 180 mg/dL (1-2 hours)      Critically ill patients:  140 - 180 mg/dL   Lab Results  Component Value Date   GLUCAP 285 (H) 11/07/2015   HGBA1C 6.6 (H) 11/04/2015    Review of Glycemic Control  Results for DAVID, BALDI (MRN [id number]) as of 11/07/2015 12:05  Ref. Range 11/06/2015 12:37 11/06/2015 16:33 11/06/2015 21:07 11/07/2015 07:57 11/07/2015 12:00  Glucose-Capillary Latest Ref Range: 65 - 99 mg/dL 201 (H) 288 (H) 307 (H) 217 (H) 285 (H)    Diabetes history: Type 2 Outpatient Diabetes medications: Metformin 1000mg  bid, Victoza 1.8mg , Amaryl 2mg /day  Current orders for Inpatient glycemic control: Novolog 0-9 units tid, Novolog 0-5 units qhs  Inpatient Diabetes Program Recommendations:   Blood sugars remain elevated.  Please consider starting low dose basal insulin, Lantus 10 units day starting now.   Consider increasing Novolog to moderate correction scale 0-15 units tid  Gentry Fitz, RN, [place], [place], CDE Diabetes Coordinator Inpatient Diabetes Program  [phone] (Team Pager) [phone] ([place]) 11/07/2015 12:14 PM

## 2015-11-07 NOTE — Progress Notes (Signed)
Patient ID: Jason Fisher, male   DOB: 06-Sep-1940, 75 y.o.   MRN: [id number]  [facility] Surgery Progress Note  1 Day Post-Op  Subjective: Surgical site feeling better today. States that it is still draining a little but pain is less. Had a BM this morning. Denies abdominal pain, n/v.  Objective: Vital signs in last 24 hours: Temp:  [96.8 F (36 C)-97.9 F (36.6 C)] 97.5 F (36.4 C) (10/05 0749) Pulse Rate:  [60-103] 62 (10/05 0956) Resp:  [13-18] 18 (10/05 0532) BP: (109-160)/(64-85) 112/68 (10/05 0956) SpO2:  [93 %-99 %] 97 % (10/05 0532) Last BM Date: 11/07/15  Intake/Output from previous day: 10/04 0701 - 10/05 0700 In: 1230 [P.O.:480; I.V.:600; IV Piggyback:150] Out: 2 [Urine:1; Blood:1] Intake/Output this shift: Total I/O In: 450 [P.O.:450] Out: -   PE: Gen:  Alert, NAD, pleasant Abd: Soft, NT Wound: healing perirectal abscess s/p I&D with no surrounding erythema or fluctuance, trace bloody drainage  Lab Results:   Recent Labs  11/06/15 0541 11/07/15 0539  WBC 8.0 20.7*  HGB 13.0 12.7*  HCT 40.2 40.2  PLT 215 267   BMET  Recent Labs  11/06/15 0541 11/07/15 0539  NA 141 141  K 4.2 3.6  CL 108 98*  CO2 22 27  GLUCOSE 239* 190*  BUN 14 15  CREATININE 1.32* 1.55*  CALCIUM 9.3 9.4   PT/INR No results for input(s): LABPROT, INR in the last 72 hours. CMP     Component Value Date/Time   NA 141 11/07/2015 0539   K 3.6 11/07/2015 0539   CL 98 (L) 11/07/2015 0539   CO2 27 11/07/2015 0539   GLUCOSE 190 (H) 11/07/2015 0539   BUN 15 11/07/2015 0539   CREATININE 1.55 (H) 11/07/2015 0539   CALCIUM 9.4 11/07/2015 0539   PROT 7.0 11/04/2015 0718   ALBUMIN 3.6 11/04/2015 0718   AST 41 11/04/2015 0718   ALT 20 11/04/2015 0718   ALKPHOS 66 11/04/2015 0718   BILITOT 1.5 (H) 11/04/2015 0718   GFRNONAA 42 (L) 11/07/2015 0539   GFRAA 49 (L) 11/07/2015 0539   Lipase  No results found for: LIPASE     Studies/Results: Ct Chest W  Contrast  Result Date: 11/06/2015 CLINICAL DATA:  Pulmonary nodules EXAM: CT CHEST WITH CONTRAST TECHNIQUE: Multidetector CT imaging of the chest was performed during intravenous contrast administration. CONTRAST:  43mL ISOVUE-300 IOPAMIDOL (ISOVUE-300) INJECTION 61% COMPARISON:  CT abdomen and pelvis including lung bases November 04, 2015; chest radiograph September 29, 2015 FINDINGS: Cardiovascular: Prominence in the ascending thoracic aorta is noted with a measured transverse diameter of 4.0 x 3.7 cm. The aorta is tortuous in the descending thoracic aorta without apparent aneurysmal dilatation. There is no thoracic aortic dissection. The visualized great vessels show moderate atherosclerotic calcification at the origins of the right common carotid and left subclavian artery. There is no appreciable pericardial thickening. There are multiple foci of coronary artery calcification. No evident pulmonary embolus. Mediastinum/Nodes: Thyroid appears unremarkable. There are scattered subcentimeter mediastinal lymph nodes. There is a sub- carinal lymph node measuring 1.5 x 1.4 cm. There is a lymph node in the right hilum measuring 1.1 x 0.9 cm. Lungs/Pleura: There is somewhat irregular opacity in the superior lingula medially with a nodular appearing area measuring 1.0 x 1.0 cm, best seen on axial slice 89 series 3. This finding is also apparent on sagittal slice 82 series 5. On axial slice 0000000 series 3 and sagittal slice A999333 series 5, there is a nodular  appearing opacity with subtle cavitation measuring 1.5 x 1.1 cm in the inferior lingula. There is a somewhat spiculated appearing lesion in the medial segment of the right lower lobe measuring 1.5 x 1.4 cm. This lesion is best seen on axial slice 123456 series 3. There is a somewhat irregular nodular appearing opacity in the posterior segment of the left lower lobe measuring 1.2 x 1.0 cm, best seen on axial slice [id number] series 3. There is scarring in each lung base as well. There  is a nodular opacity in the posterior segment right lower lobe abutting the pleura measuring 1.0 x 0.7 cm, best seen on axial slice [id number] series 3. There is lower lobe bronchiectatic change bilaterally, slightly more severe on the right than on the left. There is no appreciable pleural effusion. There is mild left base atelectatic change. Upper Abdomen: In the visualized upper abdomen, there is atherosclerotic calcification in the aorta. Incomplete visualization of abdominal aortic aneurysm noted, described on recent CT abdomen. Liver contour is somewhat lobular suggesting a degree of underlying cirrhosis. Scattered liver granulomas noted. No new lesion identified in the upper abdomen compared to 2 days prior. Musculoskeletal: There is degenerative change in the thoracic spine. No blastic or lytic bone lesions are evident. 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 IMPRESSION: Irregular nodular opacities in the lung parenchyma as summarized above. Subtle questionable cavitation in a lesion in the inferior lingula noted. Largest nodular opacity measures 1.5 x 1.4 cm. This appearance is concerning for multifocal neoplasm. In the appropriate clinical setting, septic emboli could present similarly. There are mildly prominent sub- carinal and right hilar lymph nodes which could be either of malignant or reactive etiology. Given concern for potential multifocal neoplasm and possible lymph node involvement, nuclear medicine PET study advised to further evaluate. Ascending thoracic aorta measures 4.0 x 3.7 cm. Recommend annual imaging followup by CTA or MRA. This recommendation follows 2010 ACCF/AHA/AATS/ACR/ASA/SCA/SCAI/SIR/STS/SVM Guidelines for the Diagnosis and Management of Patients with Thoracic Aortic Disease. Circulation. 2010; [id number]:5694362. No aortic dissection. Extensive coronary artery calcification. Calcification is noted at the proximal aspects of the right common carotid and left subclavian artery as  well as calcification noted throughout the thoracic aorta. Electronically Signed   By: Lowella Grip III M.D.   On: 11/06/2015 09:31    Anti-infectives: Anti-infectives    Start     Dose/Rate Route Frequency Ordered Stop   11/04/15 2200  piperacillin-tazobactam (ZOSYN) IVPB 3.375 g     3.375 g 12.5 mL/hr over 240 Minutes Intravenous Every 8 hours 11/04/15 1652     11/04/15 1345  piperacillin-tazobactam (ZOSYN) IVPB 3.375 g  Status:  Discontinued     3.375 g 12.5 mL/hr over 240 Minutes Intravenous Every 6 hours 11/04/15 1337 11/04/15 1652       Assessment/Plan S/p Incision and drainage of perianal abscess and excision of perianal skin tag 11/06/15 Dr. Ninfa Linden - POD1 - would healing well, trace bloody drainage, minimally tender, no erythema or fluctuance - recommend sitz baths and continue antibiotics  Plan - Would healing well. Recommend sitz baths and total of 7-10 days antibiotics postoperatively. Follow-up with Dr Ninfa Linden in about 3 weeks PRN. General surgery will sign off but please call with any concerns.   LOS: 3 days    Jerrye Beavers , [facility] 11/07/2015, 10:50 AM Pager: [phone] Consults: [phone] Mon-Fri 7:00 am-4:30 pm Sat-Sun 7:00 am-11:30 am

## 2015-11-07 NOTE — Care Management Important Message (Signed)
Important Message  Patient Details  Name: WINNER COURTWRIGHT MRN: [id number] Date of Birth: [birth year]   Medicare Important Message Given:  Yes    Keishawn Darsey Abena 11/07/2015, 9:59 AM

## 2015-11-07 NOTE — Anesthesia Postprocedure Evaluation (Signed)
Anesthesia Post Note  Patient: Jason Fisher  Procedure(s) Performed: Procedure(s) (LRB): IRRIGATION AND DEBRIDEMENT PERIRECTAL ABSCESS, POSSIBLE HEMORRHOIDECTOMY (N/A)  Patient location during evaluation: PACU Anesthesia Type: General Level of consciousness: awake and alert Pain management: pain level controlled Vital Signs Assessment: post-procedure vital signs reviewed and stable Respiratory status: spontaneous breathing, nonlabored ventilation, respiratory function stable and patient connected to nasal cannula oxygen Cardiovascular status: blood pressure returned to baseline and stable Postop Assessment: no signs of nausea or vomiting Anesthetic complications: no    Last Vitals:  Vitals:   11/07/15 0749 11/07/15 0956  BP: 136/74 112/68  Pulse: 60 62  Resp:    Temp: 36.4 C     Last Pain:  Vitals:   11/07/15 0749  TempSrc:   PainSc: 4                  Tiajuana Amass

## 2015-11-07 NOTE — Progress Notes (Addendum)
PROGRESS NOTE    Jason Fisher  [id number] DOB: Jul 15, 1940 DOA: 11/04/2015 PCP: Nyoka Cowden, MD   Brief Narrative:  Jason Fisher is a pleasant 75 year old gentlman with a past medical history of hypertension, dyslipidemia, diabetes mellitus, admitted to the medicine service on 11/04/2015 when he presented with complaints of anal pain associated with bloody/purulent drainage. Findings concerning for perirectal abscess for which general surgery was consulted. He had been anticoagulated with Eliquis, which was stopped on admission. Incision and drainage planned for 11/06/2015. He remains on empiric IV antibiotic therapy with Zosyn. CT scan of lungs showing multiple speak with the lower lobe lung nodules as well  Assessment & Plan:  1.  Perirectal abscess -admitted with purulent/bloody discharge from perirectal region, evaluated by general surgery, found to have abscess around rectum. -Eliquis was held, continue IV Zosyn -s/p I&D 10/4 -improving, no cultures sent from OR -WBC up but non toxic -resume Eliquis  2.  Spiculated lung nodules. -CT scan of abdomen and pelvis performed on admission revealed multiple spiculated lower lobe lung nodules. Radiology reported that the appearance was suspicious for bronchogenic carcinoma. -CT chest after premedication notable for irregular nodular opacities, largest is 1.5x1.4cm concerning for multifocal neoplasm, and subcarinal and R hilar lymph nodes noted. -d/w Pulmonary Dr.Byrum, he will schedule a FU for this patient with him and determine PET vs FU CT chest in 64months -long h/o smoking, quit 45days ago, 1PPD for approximately 50years  3.  History of coronary artery disease -Status post percutaneous intervention to RCA in 2007 and 2009 with recent stenting to OM in August 2017, had been on Plavix -He was seen and evaluated by cardiology -continue plavix  4.  Abdominal aortic aneurysm. -CT scan showed aneurysm measuring 5.2 cm in  diameter -He will need outpatient vascular surgery follow-up  5.  Diastolic congestive heart failure -Last transthoracic echocardiogram performed on 09/27/2015 that showed preserved ejection fraction 55-60% -On exam appears euvolemic -Remains on Lasix 40 mg by mouth daily  6.  Hypertension -Continue metoprolol 100 mg by mouth twice a day and diltiazem 240 mg by mouth daily. Pressures controlled  7.  History of A. Fib -Continue beta blocker and calcium channel blocker -resume eliquis today  8.  Type 2 diabetes mellitus -Sliding scale coverage  DVT prophylaxis: SCDs Code Status:Full Code Family Communication: wife at bedside Disposition Plan: home in 1-2days  Consultants:   Cardiology  Gen Surgery  Procedures:     Antimicrobials:   Zosyn   Subjective: Feels ok, bottom feels ok  Objective: Vitals:   11/06/15 2110 11/07/15 0532 11/07/15 0749 11/07/15 0956  BP: 115/80 138/85 136/74 112/68  Pulse: 73 81 60 62  Resp: 16 18    Temp: 97.9 F (36.6 C) 97.7 F (36.5 C) 97.5 F (36.4 C)   TempSrc: Oral Oral Oral   SpO2: 99% 97%    Weight:      Height:        Intake/Output Summary (Last 24 hours) at 11/07/15 1230 Last data filed at 11/07/15 0900  Gross per 24 hour  Intake             1080 ml  Output                1 ml  Net             1079 ml   Filed Weights   11/04/15 0639  Weight: 87.5 kg (193 lb)    Examination:  General exam: Appears calm and comfortable,  Nontoxic-appearing Respiratory system: Clear to auscultation. Respiratory effort normal. Cardiovascular system: S1 & S2 heard, RRR. No JVD, murmurs, rubs, gallops or clicks. No pedal edema. Gastrointestinal system: Abdomen is nondistended, soft and nontender. No organomegaly or masses felt. Normal bowel sounds heard. Central nervous system: Alert and oriented. No focal neurological deficits. Extremities: Symmetric 5 x 5 power. Skin: No rashes, lesions or ulcers Psychiatry: Judgement and insight  appear normal. Mood & affect appropriate.     Data Reviewed: I have personally reviewed following labs and imaging studies  CBC:  Recent Labs Lab 11/04/15 0706 11/05/15 0501 11/06/15 0541 11/07/15 0539  WBC 11.7* 6.7 8.0 20.7*  NEUTROABS 7.9*  --   --   --   HGB 11.9* 10.8* 13.0 12.7*  HCT 36.4* 34.0* 40.2 40.2  MCV 88.3 89.5 88.9 89.9  PLT 212 184 215 [id number]   Basic Metabolic Panel:  Recent Labs Lab 11/04/15 0718 11/05/15 0501 11/06/15 0541 11/07/15 0539  NA 140 141 141 141  K 4.3 3.3* 4.2 3.6  CL 104 105 108 98*  CO2 24 28 22 27   GLUCOSE 182* 156* 239* 190*  BUN 17 12 14 15   CREATININE 1.20 1.12 1.32* 1.55*  CALCIUM 9.1 8.7* 9.3 9.4   GFR: Estimated Creatinine Clearance: 44.9 mL/min (by C-G formula based on SCr of 1.55 mg/dL (H)). Liver Function Tests:  Recent Labs Lab 11/04/15 0718  AST 41  ALT 20  ALKPHOS 66  BILITOT 1.5*  PROT 7.0  ALBUMIN 3.6   No results for input(s): LIPASE, AMYLASE in the last 168 hours. No results for input(s): AMMONIA in the last 168 hours. Coagulation Profile: No results for input(s): INR, PROTIME in the last 168 hours. Cardiac Enzymes:  Recent Labs Lab 11/04/15 1821  CKTOTAL 34*   BNP (last 3 results) No results for input(s): PROBNP in the last 8760 hours. HbA1C:  Recent Labs  11/04/15 1821  HGBA1C 6.6*   CBG:  Recent Labs Lab 11/06/15 1237 11/06/15 1633 11/06/15 2107 11/07/15 0757 11/07/15 1200  GLUCAP 201* 288* 307* 217* 285*   Lipid Profile: No results for input(s): CHOL, HDL, LDLCALC, TRIG, CHOLHDL, LDLDIRECT in the last 72 hours. Thyroid Function Tests: No results for input(s): TSH, T4TOTAL, FREET4, T3FREE, THYROIDAB in the last 72 hours. Anemia Panel: No results for input(s): VITAMINB12, FOLATE, FERRITIN, TIBC, IRON, RETICCTPCT in the last 72 hours. Sepsis Labs:  Recent Labs Lab 11/04/15 0732 11/04/15 1821  LATICACIDVEN 2.52* 2.4*    Recent Results (from the past 240 hour(s))  Culture,  blood (routine x 2)     Status: None (Preliminary result)   Collection Time: 11/04/15  6:25 PM  Result Value Ref Range Status   Specimen Description BLOOD RIGHT ANTECUBITAL  Final   Special Requests BOTTLES DRAWN AEROBIC AND ANAEROBIC 6CC  Final   Culture NO GROWTH 2 DAYS  Final   Report Status PENDING  Incomplete  Culture, blood (routine x 2)     Status: None (Preliminary result)   Collection Time: 11/04/15  6:30 PM  Result Value Ref Range Status   Specimen Description BLOOD LEFT ANTECUBITAL  Final   Special Requests BOTTLES DRAWN AEROBIC AND ANAEROBIC 10CC  Final   Culture NO GROWTH 2 DAYS  Final   Report Status PENDING  Incomplete  Surgical PCR screen     Status: None   Collection Time: 11/06/15  7:20 AM  Result Value Ref Range Status   MRSA, PCR NEGATIVE NEGATIVE Final   Staphylococcus aureus NEGATIVE NEGATIVE Final  Comment:        The Xpert SA Assay (FDA approved for NASAL specimens in patients over 31 years of age), is one component of a comprehensive surveillance program.  Test performance has been validated by [facility] for patients greater than or equal to 72 year old. It is not intended to diagnose infection nor to guide or monitor treatment.          Radiology Studies: Ct Chest W Contrast  Result Date: 11/06/2015 CLINICAL DATA:  Pulmonary nodules EXAM: CT CHEST WITH CONTRAST TECHNIQUE: Multidetector CT imaging of the chest was performed during intravenous contrast administration. CONTRAST:  56mL ISOVUE-300 IOPAMIDOL (ISOVUE-300) INJECTION 61% COMPARISON:  CT abdomen and pelvis including lung bases November 04, 2015; chest radiograph September 29, 2015 FINDINGS: Cardiovascular: Prominence in the ascending thoracic aorta is noted with a measured transverse diameter of 4.0 x 3.7 cm. The aorta is tortuous in the descending thoracic aorta without apparent aneurysmal dilatation. There is no thoracic aortic dissection. The visualized great vessels show moderate  atherosclerotic calcification at the origins of the right common carotid and left subclavian artery. There is no appreciable pericardial thickening. There are multiple foci of coronary artery calcification. No evident pulmonary embolus. Mediastinum/Nodes: Thyroid appears unremarkable. There are scattered subcentimeter mediastinal lymph nodes. There is a sub- carinal lymph node measuring 1.5 x 1.4 cm. There is a lymph node in the right hilum measuring 1.1 x 0.9 cm. Lungs/Pleura: There is somewhat irregular opacity in the superior lingula medially with a nodular appearing area measuring 1.0 x 1.0 cm, best seen on axial slice 89 series 3. This finding is also apparent on sagittal slice 82 series 5. On axial slice 0000000 series 3 and sagittal slice [id number] series 5, there is a nodular appearing opacity with subtle cavitation measuring 1.5 x 1.1 cm in the inferior lingula. There is a somewhat spiculated appearing lesion in the medial segment of the right lower lobe measuring 1.5 x 1.4 cm. This lesion is best seen on axial slice [id number] series 3. There is a somewhat irregular nodular appearing opacity in the posterior segment of the left lower lobe measuring 1.2 x 1.0 cm, best seen on axial slice [id number] series 3. There is scarring in each lung base as well. There is a nodular opacity in the posterior segment right lower lobe abutting the pleura measuring 1.0 x 0.7 cm, best seen on axial slice [id number] series 3. There is lower lobe bronchiectatic change bilaterally, slightly more severe on the right than on the left. There is no appreciable pleural effusion. There is mild left base atelectatic change. Upper Abdomen: In the visualized upper abdomen, there is atherosclerotic calcification in the aorta. Incomplete visualization of abdominal aortic aneurysm noted, described on recent CT abdomen. Liver contour is somewhat lobular suggesting a degree of underlying cirrhosis. Scattered liver granulomas noted. No new lesion identified in the upper  abdomen compared to 2 days prior. Musculoskeletal: There is degenerative change in the thoracic spine. No blastic or lytic bone lesions are evident. 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 IMPRESSION: Irregular nodular opacities in the lung parenchyma as summarized above. Subtle questionable cavitation in a lesion in the inferior lingula noted. Largest nodular opacity measures 1.5 x 1.4 cm. This appearance is concerning for multifocal neoplasm. In the appropriate clinical setting, septic emboli could present similarly. There are mildly prominent sub- carinal and right hilar lymph nodes which could be either of malignant or reactive etiology. Given concern for potential multifocal neoplasm and possible lymph node  involvement, nuclear medicine PET study advised to further evaluate. Ascending thoracic aorta measures 4.0 x 3.7 cm. Recommend annual imaging followup by CTA or MRA. This recommendation follows 2010 ACCF/AHA/AATS/ACR/ASA/SCA/SCAI/SIR/STS/SVM Guidelines for the Diagnosis and Management of Patients with Thoracic Aortic Disease. Circulation. 2010; [id number]. No aortic dissection. Extensive coronary artery calcification. Calcification is noted at the proximal aspects of the right common carotid and left subclavian artery as well as calcification noted throughout the thoracic aorta. Electronically Signed   By: Lowella Grip III M.D.   On: 11/06/2015 09:31        Scheduled Meds: . apixaban  5 mg Oral BID  . atorvastatin  20 mg Oral Daily  . bupivacaine liposome  20 mL Infiltration Once  . clopidogrel  75 mg Oral Daily  . diltiazem  240 mg Oral Daily  . furosemide  40 mg Oral Daily  . insulin aspart  0-5 Units Subcutaneous QHS  . insulin aspart  0-9 Units Subcutaneous TID WC  . metoprolol  100 mg Oral BID  . piperacillin-tazobactam (ZOSYN)  IV  3.375 g Intravenous Q8H  . sodium chloride flush  3 mL Intravenous Q12H   Continuous Infusions:     LOS: 3 days    Time spent:  35 min    Thresia Ramanathan, MD [facility] Pager [phone]  If 7PM-7AM, please contact night-coverage [email or web address] Password [email or web address] 11/07/2015, 12:30 PM

## 2015-11-07 NOTE — Op Note (Signed)
NAMEHARON, SLOMINSKI NO.:  [id number]  MEDICAL RECORD NO.:  [id number]  LOCATION:  [id number]                        FACILITY:  [facility]  PHYSICIAN:  Coralie Keens, M.D. DATE OF BIRTH:  1940/10/26  DATE OF PROCEDURE:  11/06/2015 DATE OF DISCHARGE:                              OPERATIVE REPORT   PREOPERATIVE DIAGNOSIS:  Perianal abscess.  POSTOPERATIVE DIAGNOSIS:  Perianal abscess.  PROCEDURE:  Incision and drainage of perianal abscess and excision of perianal skin tag.  SURGEON:  Coralie Keens, M.D.  ANESTHESIA:  General and injectable Exparel.  ESTIMATED BLOOD LOSS:  Minimal.  INDICATIONS:  This is a 75 year old gentleman with multiple significant comorbidities who is on multiple anticoagulating medications secondary to recent myocardial infarction and stent placement.  He now presents with a perirectal abscess which is in need of drainage.  PROCEDURE IN DETAIL:  The patient was brought to the operating room, identified as correct patient.  He was placed supine on the operating table and general anesthesia was induced.  The patient was then placed in a lithotomy position.  His perianal area was then prepped and draped in usual sterile fashion.  The patient has had an abscess just to the right of the posterior midline.  There was an opening in the skin which I widened further with a hemostat.  Most of purulence had already drained out of the cavity.  I then achieved hemostasis with the cautery. It did not appear to communicate anywhere else and appears superficial. There was a lateral skin tag that has been bothering the patient, so I excised this as well with the electrocautery.  I injected the area circumferentially with injectable Exparel.  I then placed 2 separate small pieces of Gelfoam into the abscess cavity for hemostasis.  At this point, hemostasis appeared to be achieved.  The patient tolerated the procedure well.  All the counts were correct  at the end of the procedure.  The patient was then extubated in the operating room and taken in stable condition to recovery room.     Coralie Keens, M.D.     DB/MEDQ  D:  11/06/2015  T:  11/07/2015  Job:  [id number]

## 2015-11-08 ENCOUNTER — Telehealth: Payer: Self-pay | Admitting: Internal Medicine

## 2015-11-08 LAB — BASIC METABOLIC PANEL
Anion gap: 9 (ref 5–15)
BUN: 17 mg/dL (ref 6–20)
CALCIUM: 8.8 mg/dL — AB (ref 8.9–10.3)
CO2: 28 mmol/L (ref 22–32)
Chloride: 105 mmol/L (ref 101–111)
Creatinine, Ser: 1.38 mg/dL — ABNORMAL HIGH (ref 0.61–1.24)
GFR calc Af Amer: 57 mL/min — ABNORMAL LOW (ref >60)
GFR, EST NON AFRICAN AMERICAN: 49 mL/min — AB (ref >60)
GLUCOSE: 187 mg/dL — AB (ref 65–99)
Potassium: 3.4 mmol/L — ABNORMAL LOW (ref 3.5–5.1)
Sodium: 142 mmol/L (ref 135–145)

## 2015-11-08 LAB — CBC
HEMATOCRIT: 36.8 % — AB (ref 39.0–52.0)
Hemoglobin: 11.5 g/dL — ABNORMAL LOW (ref 13.0–17.0)
MCH: 28.1 pg (ref 26.0–34.0)
MCHC: 31.3 g/dL (ref 30.0–36.0)
MCV: 90 fL (ref 78.0–100.0)
Platelets: 211 10*3/uL (ref 150–400)
RBC: 4.09 MIL/uL — ABNORMAL LOW (ref 4.22–5.81)
RDW: 14.5 % (ref 11.5–15.5)
WBC: 10.2 10*3/uL (ref 4.0–10.5)

## 2015-11-08 LAB — GLUCOSE, CAPILLARY: GLUCOSE-CAPILLARY: 166 mg/dL — AB (ref 65–99)

## 2015-11-08 MED ORDER — AMOXICILLIN-POT CLAVULANATE 875-125 MG PO TABS
1.0000 | ORAL_TABLET | Freq: Two times a day (BID) | ORAL | Status: DC
  Administered 2015-11-08: 1 via ORAL
  Filled 2015-11-08: qty 1

## 2015-11-08 MED ORDER — OXYCODONE HCL 5 MG PO TABS: 5.0000 mg | ORAL_TABLET | Freq: Four times a day (QID) | ORAL | 0 refills | Status: DC | PRN

## 2015-11-08 MED ORDER — AMOXICILLIN-POT CLAVULANATE 875-125 MG PO TABS: 1.0000 | ORAL_TABLET | Freq: Two times a day (BID) | ORAL | 0 refills | Status: DC

## 2015-11-08 NOTE — Discharge Summary (Signed)
Physician Discharge Summary  Jason Fisher [id number] DOB: 01-Oct-1940 DOA: 11/04/2015  PCP: Jason Cowden, MD  Admit date: 11/04/2015 Discharge date: 11/08/2015  Time spent: 45 minutes  Recommendations for Outpatient Follow-up:  1. Jason Fisher in 1week 2. Home health RN 3. Pulm Jason Fisher for spiculated lung nodules 11/6 at 4pm 4. Vascular surgery in 1 month for AAA 5. PCP Jason Fisher in 1 week   Discharge Diagnoses:  Principal Problem:   Perirectal abscess Active Problems:   Dyslipidemia   Essential hypertension   CAD   MYOCARDIAL INFARCTION, HX OF   Diastolic congestive heart failure (HCC)   Paroxysmal atrial fibrillation (HCC)   Diabetes mellitus with complication (HCC)   Lung nodules   Coronary artery disease of bypass graft of native heart with stable angina pectoris ([place])   Abdominal aortic aneurysm (AAA) without rupture [facility])   Discharge Condition: stable  Diet recommendation: diabetic, heart healthy  Filed Weights   11/04/15 0639  Weight: 87.5 kg (193 lb)    History of present illness:  [name] is a pleasant 75 year old gentlman with a past medical history of hypertension, dyslipidemia, diabetes mellitus, admitted to the medicine service on 11/04/2015 when he presented with complaints of anal pain associated with bloody/purulent drainage. Findings concerning for perirectal abscess.  Hospital Course:   1.  Perirectal abscess -admitted with purulent/bloody discharge from perirectal region, evaluated by general surgery, found to have abscess around rectum. -Eliquis was held, started on IV Zosyn then underwent I&D on 10/4 -clinically improving, no cultures sent from OR -CCS recommended 7days of Abx at discharge and to continue sitz bath -Home health RN set up prior to discharge  2.  Spiculated lung nodules. -CT scan of abdomen and pelvis performed on admission revealed multiple spiculated lower lobe lung nodules. Radiology reported that the  appearance was suspicious for bronchogenic carcinoma. -CT chest after premedication due to contrast allergy was notable for irregular nodular opacities, largest is 1.5x1.4cm concerning for multifocal neoplasm, and subcarinal and R hilar lymph nodes noted. -d/w Pulmonary Jason Fisher, he reviewed images and recommended FU with him and he will determine PET vs FU CT chest in 77months, we made pt a FU for 11/6 -long h/o smoking, quit 45days ago, 1PPD for approximately 50years  3.  History of coronary artery disease -Status post percutaneous intervention to RCA in 2007 and 2009 with recent stenting to OM in August 2017, had been on Plavix -He was seen and evaluated by cardiology -continue plavix, BB, statin  4.  Abdominal aortic aneurysm. -CT scan on admission showed aneurysm measuring 5.2 cm in diameter -He will need outpatient vascular surgery follow-up, pt and wife advised to FU as outpatient within 1 month  5.  Diastolic congestive heart failure -Last transthoracic echocardiogram performed on 09/27/2015 that showed preserved ejection fraction 55-60% -On exam appears euvolemic -Remains on Lasix 40 mg by mouth daily  6.  Hypertension -Continue metoprolol 100 mg by mouth twice a day and diltiazem 240 mg by mouth daily.  -stable  7.  History of A. Fib -chadsvasc score>3 -Continue beta blocker and calcium channel blocker -resumed eliquis   8.  Type 2 diabetes mellitus -stable, resumed home meds  Consultations:  CCS Jason Fisher  D/w Pulm Jason Fisher  Discharge Exam: Vitals:   11/07/15 2133 11/08/15 0526  BP: 140/66 135/83  Pulse: 89 76  Resp: 18 17  Temp: 97.5 F (36.4 C) 97.5 F (36.4 C)    General: AAOx3 Cardiovascular: S1S2/RRR Respiratory: CTAB  Discharge Instructions   Discharge Instructions  Diet - low sodium heart healthy    Complete by:  As directed    Diet Carb Modified    Complete by:  As directed    Increase activity slowly    Complete by:  As  directed      Current Discharge Medication List    START taking these medications   Details  amoxicillin-clavulanate (AUGMENTIN) 875-125 MG tablet Take 1 tablet by mouth every 12 (twelve) hours. For 7days Qty: 14 tablet, Refills: 0    oxyCODONE (OXY IR/ROXICODONE) 5 MG immediate release tablet Take 1 tablet (5 mg total) by mouth every 6 (six) hours as needed for moderate pain or severe pain. Qty: 30 tablet, Refills: 0      CONTINUE these medications which have NOT CHANGED   Details  apixaban (ELIQUIS) 5 MG TABS tablet Take 1 tablet (5 mg total) by mouth 2 (two) times daily. Qty: 60 tablet, Refills: 6    atorvastatin (LIPITOR) 20 MG tablet TAKE 1 TABLET BY MOUTH EVERY DAY Qty: 90 tablet, Refills: 1    clopidogrel (PLAVIX) 75 MG tablet TAKE 1 TABLET BY MOUTH EVERY DAY Qty: 90 tablet, Refills: 3    diltiazem (CARDIZEM CD) 240 MG 24 hr capsule Take 1 capsule (240 mg total) by mouth daily. Qty: 30 capsule, Refills: 1    furosemide (LASIX) 40 MG tablet Take 1 tablet (40 mg total) by mouth daily. Qty: 30 tablet, Refills: 1    glimepiride (AMARYL) 4 MG tablet Take 0.5 tablets (2 mg total) by mouth daily with breakfast. Qty: 90 tablet, Refills: 3    Liraglutide (VICTOZA) 18 MG/3ML SOPN Inject 1.8 mg into the skin daily. Takes 0.67ml's    metFORMIN (GLUCOPHAGE) 1000 MG tablet TAKE 1 TABLET TWICE DAILY WITH A MEAL Qty: 180 tablet, Refills: 1    metoprolol (LOPRESSOR) 100 MG tablet Take 1 tablet (100 mg total) by mouth 2 (two) times daily. Qty: 180 tablet, Refills: 1    NITROSTAT 0.4 MG SL tablet PLACE 1 TABLET UNDER TONGUE EVERY 5 MINUTES AS NEEDED Qty: 25 tablet, Refills: 0    polyethylene glycol powder (GLYCOLAX/MIRALAX) powder Take 17 g by mouth 2 (two) times daily as needed. Qty: 3350 g, Refills: 1    ACCU-CHEK AVIVA PLUS test strip CHECK BLOOD SUGAR ONCE DAILY AS NEEDED Qty: 100 each, Refills: 4    Insulin Pen Needle 29G X 12.7MM MISC 1 each by Does not apply route 1 day  or 1 dose. Qty: 100 each, Refills: 3    Lancets (ACCU-CHEK MULTICLIX) lancets Once daily 250.00 Qty: 100 each, Refills: 3      STOP taking these medications     ramipril (ALTACE) 10 MG capsule        Allergies  Allergen Reactions  . Contrast Media [Iodinated Diagnostic Agents] Anaphylaxis   Follow-up Information    BLACKMAN,DOUGLAS A, MD. Schedule an appointment as soon as possible for a visit in 1 week(s).   Specialty:  General Surgery Contact information: [address] [place] [address] [phone]        Collene Gobble., MD Follow up on 12/09/2015.   Specialty:  Pulmonary Disease Why:  at 4pm for spiculated lung nodules Contact information: [address] [address] [phone]        [facility]. Schedule an appointment as soon as possible for a visit in 1 month(s).   Why:  for Abdominal aortic aneurysm Contact information: [address] [place] [phone]  [id number]           The results of significant diagnostics from this hospitalization (including imaging, microbiology, ancillary and laboratory) are listed below for reference.    Significant Diagnostic Studies: Ct Abdomen Pelvis Wo Contrast  Addendum Date: 11/04/2015   ADDENDUM REPORT: 11/04/2015 11:20 ADDENDUM: Study discussed by telephone with Dr. Duffy Bruce on 11/04/2015 At 1113 hours. He advised that on rectal exam there was a mix of bloody and purulent discharge. He suspects an abscess at the level of the anal verge, which these images probably do not get low enough to the demonstrate. If he sends the patient back for additional CT images through the perineum then I will report those on another addendum. Electronically Signed   By: Genevie Ann M.D.   On: 11/04/2015 11:20   Result Date: 11/04/2015 CLINICAL DATA:  75 year old male with rectal pain, not responding to his usual hemorrhoid treatment. Initial encounter. EXAM: CT ABDOMEN AND  PELVIS WITHOUT CONTRAST TECHNIQUE: Multidetector CT imaging of the abdomen and pelvis was performed following the standard protocol without IV contrast. COMPARISON:  CTA abdomen and pelvis 10/12/2007. FINDINGS: Lower chest: Mild cardiomegaly.  No pericardial or pleural effusion. 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 However, there is also a 2 cm spiculated lung nodule at the right lower lobe on series 3, image 8 (and coronal image 89) with retractions of the surrounding lung parenchyma. There are smaller spiculated nodules also in the right middle lobe (series 3, image 4) right medial costophrenic angle (image 10) and left medial costophrenic angle (image 12). There are several additional distal peribronchial small nodules also in the left lower lobe (images 5 and 7). No upper abdominal free air or free fluid. Hepatobiliary: Mildly increased lobulation of the liver and suggestion of some interval left lobe hypertrophy. Several calcified granulomas in the liver, no other discrete liver lesion in the absence of IV contrast. Negative gallbladder. Pancreas: Negative. Spleen: Negative. (Punctate calcified granulomas occasionally noted). Adrenals/Urinary Tract: Negative adrenal glands. Right midpole nephrolithiasis measuring 6 mm. No hydronephrosis. No hydroureter or periureteral stranding. Chronic right renal lower pole cyst with simple fluid densitometry. Unremarkable urinary bladder. Stomach/Bowel: Negative rectum. No perirectal stranding. Mild gas and stool distending the rectum. The entire perineum is not included. Mildly redundant sigmoid colon with mild to moderate diverticulosis but no active inflammation. Occasional diverticulum left colon with mild retained stool. Oral contrast has reached the mid transverse colon. Redundant right colon. Negative appendix. Negative terminal ileum. No dilated small bowel. Decompressed  stomach and duodenum. Vascular/Lymphatic: Chronic but progressed distal abdominal aorta fusiform aneurysm measuring up to 52 mm diameter (series 2, image 51 and coronal image 88). This was 36 mm in 2009. There is no evidence of aneurysm rupture. Extensive superimposed Aortoiliac calcified atherosclerosis noted. No IV contrast administered. No lymphadenopathy. Reproductive: Partially visible small bilateral hydroceles. Other: Small fat containing bilateral inguinal hernias. No pelvic free fluid. Musculoskeletal: Osteopenia.  No acute or suspicious osseous lesion. IMPRESSION: 1. No abnormality which definitely explains acute rectal pain, but multiple abnormalities which require further follow-up as follows: 2. Progressed infrarenal abdominal aortic aneurysm now up to 52 mm diameter. No evidence of rupture. Recommend followup by abdomen and pelvis CTA in 3-6 months, and Vascular Surgery Referral/consultation if not already obtained. This recommendation follows ACR consensus guidelines: White Paper of the ACR Incidental Findings Committee II on Vascular Findings. J Am Coll Radiol 2013; 10:789-794. 3. Multiple  spiculated lower lobe lung nodules, the largest is 2 cm in the right lower lobe. Underlying lower lobe Bronchiectasis such that these could be inflammatory, however, the appearance is suspicious for bronchogenic carcinoma. PET-CT evaluation would likely be most valuable at this point. 4. Mildly lobular appearance of the liver with suggestion of some left lobe hypertrophy since 2009, raising the possibility of developing cirrhosis. Electronically Signed: By: Genevie Ann M.D. On: 11/04/2015 11:08   Ct Chest W Contrast  Result Date: 11/06/2015 CLINICAL DATA:  Pulmonary nodules EXAM: CT CHEST WITH CONTRAST TECHNIQUE: Multidetector CT imaging of the chest was performed during intravenous contrast administration. CONTRAST:  82mL ISOVUE-300 IOPAMIDOL (ISOVUE-300) INJECTION 61% COMPARISON:  CT abdomen and pelvis including  lung bases November 04, 2015; chest radiograph September 29, 2015 FINDINGS: Cardiovascular: Prominence in the ascending thoracic aorta is noted with a measured transverse diameter of 4.0 x 3.7 cm. The aorta is tortuous in the descending thoracic aorta without apparent aneurysmal dilatation. There is no thoracic aortic dissection. The visualized great vessels show moderate atherosclerotic calcification at the origins of the right common carotid and left subclavian artery. There is no appreciable pericardial thickening. There are multiple foci of coronary artery calcification. No evident pulmonary embolus. Mediastinum/Nodes: Thyroid appears unremarkable. There are scattered subcentimeter mediastinal lymph nodes. There is a sub- carinal lymph node measuring 1.5 x 1.4 cm. There is a lymph node in the right hilum measuring 1.1 x 0.9 cm. Lungs/Pleura: There is somewhat irregular opacity in the superior lingula medially with a nodular appearing area measuring 1.0 x 1.0 cm, best seen on axial slice 89 series 3. This finding is also apparent on sagittal slice 82 series 5. On axial slice 0000000 series 3 and sagittal slice [id number] series 5, there is a nodular appearing opacity with subtle cavitation measuring 1.5 x 1.1 cm in the inferior lingula. There is a somewhat spiculated appearing lesion in the medial segment of the right lower lobe measuring 1.5 x 1.4 cm. This lesion is best seen on axial slice 123456 series 3. There is a somewhat irregular nodular appearing opacity in the posterior segment of the left lower lobe measuring 1.2 x 1.0 cm, best seen on axial slice [id number] series 3. There is scarring in each lung base as well. There is a nodular opacity in the posterior segment right lower lobe abutting the pleura measuring 1.0 x 0.7 cm, best seen on axial slice [id number] series 3. There is lower lobe bronchiectatic change bilaterally, slightly more severe on the right than on the left. There is no appreciable pleural effusion. There is mild left  base atelectatic change. Upper Abdomen: In the visualized upper abdomen, there is atherosclerotic calcification in the aorta. Incomplete visualization of abdominal aortic aneurysm noted, described on recent CT abdomen. Liver contour is somewhat lobular suggesting a degree of underlying cirrhosis. Scattered liver granulomas noted. No new lesion identified in the upper abdomen compared to 2 days prior. Musculoskeletal: There is degenerative change in the thoracic spine. No blastic or lytic bone lesions are evident. 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 IMPRESSION: Irregular nodular opacities in the lung parenchyma as summarized above. Subtle questionable cavitation in a lesion in the inferior lingula noted. Largest nodular opacity measures 1.5 x 1.4 cm. This appearance is concerning for multifocal neoplasm. In the appropriate clinical setting, septic emboli could present similarly. There are mildly prominent sub- carinal and right hilar lymph nodes which could be either of malignant or reactive etiology. Given concern for potential multifocal neoplasm and possible  lymph node involvement, nuclear medicine PET study advised to further evaluate. Ascending thoracic aorta measures 4.0 x 3.7 cm. Recommend annual imaging followup by CTA or MRA. This recommendation follows 2010 ACCF/AHA/AATS/ACR/ASA/SCA/SCAI/SIR/STS/SVM Guidelines for the Diagnosis and Management of Patients with Thoracic Aortic Disease. Circulation. 2010; 121SP:1689793. No aortic dissection. Extensive coronary artery calcification. Calcification is noted at the proximal aspects of the right common carotid and left subclavian artery as well as calcification noted throughout the thoracic aorta. Electronically Signed   By: Lowella Grip III M.D.   On: 11/06/2015 09:31    Microbiology: Recent Results (from the past 240 hour(s))  Culture, blood (routine x 2)     Status: None (Preliminary result)   Collection Time: 11/04/15  6:25 PM   Result Value Ref Range Status   Specimen Description BLOOD RIGHT ANTECUBITAL  Final   Special Requests BOTTLES DRAWN AEROBIC AND ANAEROBIC 6CC  Final   Culture NO GROWTH 3 DAYS  Final   Report Status PENDING  Incomplete  Culture, blood (routine x 2)     Status: None (Preliminary result)   Collection Time: 11/04/15  6:30 PM  Result Value Ref Range Status   Specimen Description BLOOD LEFT ANTECUBITAL  Final   Special Requests BOTTLES DRAWN AEROBIC AND ANAEROBIC 10CC  Final   Culture NO GROWTH 3 DAYS  Final   Report Status PENDING  Incomplete  Surgical PCR screen     Status: None   Collection Time: 11/06/15  7:20 AM  Result Value Ref Range Status   MRSA, PCR NEGATIVE NEGATIVE Final   Staphylococcus aureus NEGATIVE NEGATIVE Final    Comment:        The Xpert SA Assay (FDA approved for NASAL specimens in patients over 70 years of age), is one component of a comprehensive surveillance program.  Test performance has been validated by [facility] for patients greater than or equal to 8 year old. It is not intended to diagnose infection nor to guide or monitor treatment.      Labs: Basic Metabolic Panel:  Recent Labs Lab 11/04/15 0718 11/05/15 0501 11/06/15 0541 11/07/15 0539 11/08/15 0629  NA 140 141 141 141 142  K 4.3 3.3* 4.2 3.6 3.4*  CL 104 105 108 98* 105  CO2 24 28 22 27 28   GLUCOSE 182* 156* 239* 190* 187*  BUN 17 12 14 15 17   CREATININE 1.20 1.12 1.32* 1.55* 1.38*  CALCIUM 9.1 8.7* 9.3 9.4 8.8*   Liver Function Tests:  Recent Labs Lab 11/04/15 0718  AST 41  ALT 20  ALKPHOS 66  BILITOT 1.5*  PROT 7.0  ALBUMIN 3.6   No results for input(s): LIPASE, AMYLASE in the last 168 hours. No results for input(s): AMMONIA in the last 168 hours. CBC:  Recent Labs Lab 11/04/15 0706 11/05/15 0501 11/06/15 0541 11/07/15 0539 11/08/15 0629  WBC 11.7* 6.7 8.0 20.7* 10.2  NEUTROABS 7.9*  --   --   --   --   HGB 11.9* 10.8* 13.0 12.7* 11.5*  HCT 36.4* 34.0*  40.2 40.2 36.8*  MCV 88.3 89.5 88.9 89.9 90.0  PLT 212 184 215 267 211   Cardiac Enzymes:  Recent Labs Lab 11/04/15 1821  CKTOTAL 34*   BNP: BNP (last 3 results)  Recent Labs  09/24/15 2038  BNP 155.5*    ProBNP (last 3 results) No results for input(s): PROBNP in the last 8760 hours.  CBG:  Recent Labs Lab 11/07/15 0757 11/07/15 1200 11/07/15 1730 11/07/15 2131 11/08/15  [place]       SignedDomenic Polite MD.  [facility] 11/08/2015, 10:53 AM

## 2015-11-08 NOTE — Progress Notes (Signed)
Patient was discharged home by MD order; discharged instructions  review and give to patient and his wife with care notes and prescriptions; IV DIC; patient will be escorted to the car by a volunteer via wheelchair.  

## 2015-11-08 NOTE — Telephone Encounter (Signed)
[facility] - Client [place] [facility] Patient Name: Jason Fisher DOB: [birth year] Initial Comment Caller states her husband was just released from hospital and advised to check with primary dr to see if should continue taking Victoza, xferred from office, no symptoms at the moment Nurse Assessment Guidelines Guideline Title Affirmed Question Affirmed Notes Final Disposition User Clinical Call Martyn Ehrich, RN, Felicia Comments at alternate no. reached son who said for nurse to call the other no. for the caller made 3 attempts to reach caller at primary no. left 2 messages but did not reach wife or pt

## 2015-11-08 NOTE — Telephone Encounter (Signed)
12/09/15 is ok

## 2015-11-08 NOTE — Care Management Note (Signed)
Case Management Note  Patient Details  Name: Jason Fisher MRN: [id number] Date of Birth: 1940/03/25  Subjective/Objective:                 Patient from home with wife, independent, still drive. Will follow up with surgery in one week. Declined HH services. Stated they had no DME needs.    Action/Plan:  No CM needs identified at this time.  Expected Discharge Date:                  Expected Discharge Plan:  Home/Self Care  In-House Referral:  NA  Discharge planning Services  CM Consult  Post Acute Care Choice:  NA Choice offered to:  Patient, Spouse  DME Arranged:  N/A DME Agency:  NA  HH Arranged:  Patient Refused [place] Agency:  NA  Status of Service:  Completed, signed off  If discussed at [employer] of Stay Meetings, dates discussed:    Additional Comments:  Carles Collet, RN 11/08/2015, 10:54 AM

## 2015-11-09 ENCOUNTER — Other Ambulatory Visit: Payer: Self-pay | Admitting: Internal Medicine

## 2015-11-09 LAB — CULTURE, BLOOD (ROUTINE X 2)
CULTURE: NO GROWTH
CULTURE: NO GROWTH

## 2015-11-11 ENCOUNTER — Other Ambulatory Visit: Payer: Self-pay | Admitting: *Deleted

## 2015-11-11 NOTE — Patient Outreach (Signed)
Pt had a readmission for an unrelated health issue within 30 days. He was discharged home on Friday. I spoke to his wife this morning and he says he is doing favorably. He has been able to have BMs, pain in controlled. He has all his meds and is taking an antibiotic for this week. His follow up appt will be next week.   I see he is scheduled to have an AAA repair next month.  I have asked Mrs.Tantillo if I may come out and get acquainted with them so they have an advocate going into this complex surgery. Mrs. Gilford has agreed and I will meet with them this Wednesday.  Deloria Lair [facility] [place] [phone]

## 2015-11-12 ENCOUNTER — Ambulatory Visit: Payer: Self-pay | Admitting: *Deleted

## 2015-11-12 NOTE — Telephone Encounter (Signed)
Spoke to pt, told him according to discharge info from hospital he should continue taking Victoza all medications as prescribed. Also need to make hospital follow up with Dr.K. Pt verbalized understanding.

## 2015-11-12 NOTE — Telephone Encounter (Signed)
Spoke with pt. He has been scheduled on 12/10/15 at 11am with RB. Nothing further was needed at this time.

## 2015-11-13 ENCOUNTER — Other Ambulatory Visit: Payer: Self-pay | Admitting: *Deleted

## 2015-11-13 ENCOUNTER — Encounter: Payer: Self-pay | Admitting: *Deleted

## 2015-11-13 NOTE — Patient Outreach (Signed)
[facility]) Care Management   11/13/2015  Jason Fisher [birth year] [id number]  Jason Fisher is an 75 y.o. male  Subjective: Initial home visit. I visited with Mr. And Mrs. Arons. Patient has had 2 recent hospitalizations for unrelated events. Most recently, for perirectal abscess in which he had to have surgery. He is recooperating well from this procedure and having no complications. Previously he had a stay for CHF in August. He report he feels stable with regards to his HF. Incidentally during his hosptial stay a chest X ray revealed some abnormal findings and he will be seen by pulmonology early in November. 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  Objective:   ROS :Denies perianal pain and is able to move bowels without any problem, finishing antibiotic today.           Denies SOB, wt gain or edema.            BP 140/84 (BP Location: Right Arm, Patient Position: Sitting, Cuff Size: Normal)   Pulse 71   Resp 16   Ht 1.727 m (5\' 8" )   Wt 190 lb (86.2 kg)   SpO2 94%   BMI 28.89 kg/m   Physical Exam Heart: AFIB, no peripheral edema           Lungs: No SOB, Clear but does have an occasional cough, nonproductive.  Patient was recently discharged from hospital and all medications have been reviewed.   Encounter Medications:   Outpatient Encounter Prescriptions as of 11/13/2015  Medication Sig Note  . ACCU-CHEK AVIVA PLUS test strip CHECK BLOOD SUGAR ONCE DAILY AS NEEDED   . amoxicillin-clavulanate (AUGMENTIN) 875-125 MG tablet Take 1 tablet by mouth every 12 (twelve) hours. For 7days   . apixaban (ELIQUIS) 5 MG TABS tablet Take 1 tablet (5 mg total) by mouth 2 (two) times daily.   Marland Kitchen atorvastatin (LIPITOR) 20 MG tablet TAKE 1 TABLET BY MOUTH EVERY DAY   . clopidogrel (PLAVIX) 75 MG tablet TAKE 1 TABLET BY MOUTH EVERY DAY   . diltiazem (CARDIZEM CD) 240 MG 24 hr capsule Take 1 capsule (240 mg total) by mouth daily.   .  furosemide (LASIX) 40 MG tablet Take 1 tablet (40 mg total) by mouth daily.   Marland Kitchen glimepiride (AMARYL) 4 MG tablet Take 0.5 tablets (2 mg total) by mouth daily with breakfast.   . Insulin Pen Needle 29G X 12.7MM MISC 1 each by Does not apply route 1 day or 1 dose.   . Lancets (ACCU-CHEK MULTICLIX) lancets Once daily 250.00   . Liraglutide (VICTOZA) 18 MG/3ML SOPN Inject 1.8 mg into the skin daily. Takes 0.39ml's   . metFORMIN (GLUCOPHAGE) 1000 MG tablet TAKE 1 TABLET TWICE DAILY WITH A MEAL   . metoprolol (LOPRESSOR) 100 MG tablet Take 1 tablet (100 mg total) by mouth 2 (two) times daily.   Marland Kitchen NITROSTAT 0.4 MG SL tablet PLACE 1 TABLET UNDER TONGUE EVERY 5 MINUTES AS NEEDED   . oxyCODONE (OXY IR/ROXICODONE) 5 MG immediate release tablet Take 1 tablet (5 mg total) by mouth every 6 (six) hours as needed for moderate pain or severe pain. 11/13/2015: Only as needed.   . polyethylene glycol powder (GLYCOLAX/MIRALAX) powder Take 17 g by mouth 2 (two) times daily as needed. (Patient taking differently: Take 17 g by mouth 2 (two) times daily as needed for mild constipation. )    No facility-administered encounter medications on file  as of 11/13/2015.     Functional Status:   In your present state of health, do you have any difficulty performing the following activities: 11/04/2015 11/04/2015  Hearing? - N  Vision? - N  Difficulty concentrating or making decisions? - N  Walking or climbing stairs? - Y  Dressing or bathing? - N  Doing errands, shopping? N -  Preparing Food and eating ? - -  Using the Toilet? - -  In the past six months, have you accidently leaked urine? - -  Do you have problems with loss of bowel control? - -  Managing your Medications? - -  Managing your Finances? - -  Housekeeping or managing your Housekeeping? - -  Some recent data might be hidden    Fall/Depression Screening:    PHQ 2/9 Scores 10/11/2015 04/22/2015 03/07/2014 10/31/2013 10/07/2012  PHQ - 2 Score 0 0 0 0 0    Depression screen [facility] 2/9 10/11/2015 04/22/2015 03/07/2014 10/31/2013 10/07/2012  Decreased Interest 0 0 0 0 0  Down, Depressed, Hopeless 0 0 0 0 0  PHQ - 2 Score 0 0 0 0 0    Assessment:  CAD                         HF                         S/P perirectal absess surgery                         Diabetes                         AAA  [facility] CM Care Plan Problem One   Flowsheet Row Most Recent Value  Care Plan Problem One  New diagnosis of CHF.  Role Documenting the Problem One  Care Management [place] for Problem One  Active  THN Long Term Goal (31-90 days)  Pt will not readmit due to HF over the next 31 days.  THN Long Term Goal Start Date  10/11/15  Interventions for Problem One Long Term Goal  Reviewed pathophysiology of HF, meds and what they do, reasons for action plan.  THN CM Short Term Goal #1 (0-30 days)  Pt will verbalize knowledge of disease process, pathophysiology at the end of 30 days.  THN CM Short Term Goal #1 Start Date  10/11/15  Interventions for Short Term Goal #1  Educated about RED FLAGS: wt gain, SOB, edema, overwhelming fatigue and need to call me or MD if in Yellow Zone.  THN CM Short Term Goal #2 (0-30 days)  Pt will be able to discuss which meds he takes for HF and what they do at the end of 30 days.  THN CM Short Term Goal #2 Start Date  10/11/15  Interventions for Short Term Goal #2  Educated about meds and their purpose.   THN CM Short Term Goal #3 (0-30 days)  Pt will be educated about low salt diet and things to avoid and will be able to discuss at the end of 30 days.  THN CM Short Term Goal #3 Start Date  11/13/15  Interventions for Short Tern Goal #3  Educated on reason for low salt diet, discussed avoiding added salt, using other herbal seasonings, avoiding processed foods, reading labels for sodium content and serving size.  Plan:  I wil see pt again in 2 weeks and provide additional HF education and AAA education.            Discussed  HF ACTION PLAN, low salt diet.  Deloria Lair [facility] [place] [phone]

## 2015-11-18 NOTE — Patient Outreach (Signed)
11/18/15- Telephone call to patient for transition of care week 2, spoke with pt, HIPAA verified, pt reports he is weighing daily and weight today 194 pounds, has all medications and taking as prescribed. Pt reports he is having testing done November 6 for lung lesions.  No new issues or concerns reported today.  PLAN Continue weekly transition of care calls  Jacqlyn Larsen [facility], [place] Coordinator [phone]

## 2015-11-27 ENCOUNTER — Telehealth: Payer: Self-pay | Admitting: Cardiology

## 2015-11-27 ENCOUNTER — Other Ambulatory Visit: Payer: Self-pay | Admitting: *Deleted

## 2015-11-27 MED ORDER — HYDRALAZINE HCL 10 MG PO TABS: 10.0000 mg | ORAL_TABLET | Freq: Three times a day (TID) | ORAL | 6 refills | Status: DC

## 2015-11-27 NOTE — Patient Outreach (Addendum)
[facility]) Care Management   11/27/2015  Jason Fisher [birth year] [id number]  Jason Fisher is an 75 y.o. male  Subjective: Jason Fisher is feeling well. He had his follow up surgical appt and he has healed well, status post perirectal abscess. He reports he is weighing daily, avoiding salt, paying attention to his breathing status. He is taking his meds as ordered. His wife shares his [name] monitoring book and his blood pressures are NOT AT GOAL. They range from 782- 956 systolically and 21-308 diastolically.   Objective:   Review of Systems  Constitutional: Negative.   HENT: Negative.   Eyes: Negative.   Respiratory: Positive for cough.        Occasional.  Cardiovascular: Negative.   Gastrointestinal: Negative.   Genitourinary: Negative.   Musculoskeletal: Negative.   Skin: Negative.   Neurological: Negative.   Endo/Heme/Allergies: Bruises/bleeds easily.  Psychiatric/Behavioral: Negative.    BP (!) 160/80 (BP Location: Right Arm, Patient Position: Sitting)   Pulse 80 Comment: Irregular, AFIB  Resp 16   Wt 193 lb (87.5 kg)   SpO2 98%   BMI 29.35 kg/m   Physical Exam  Constitutional: He is oriented to person, place, and time. He appears well-developed and well-nourished.  HENT:  Head: Normocephalic and atraumatic.  Cardiovascular:  Irregular - AFIB.  Respiratory: Effort normal and breath sounds normal.  GI: Soft. Bowel sounds are normal.  Musculoskeletal: Normal range of motion.  Neurological: He is alert and oriented to person, place, and time.  Skin: Skin is warm and dry.  Psychiatric: He has a normal mood and affect.    Encounter Medications:   Outpatient Encounter Prescriptions as of 11/27/2015  Medication Sig Note  . ACCU-CHEK AVIVA PLUS test strip CHECK BLOOD SUGAR ONCE DAILY AS NEEDED   . apixaban (ELIQUIS) 5 MG TABS tablet Take 1 tablet (5 mg total) by mouth 2 (two) times daily.   Marland Kitchen atorvastatin (LIPITOR) 20 MG tablet TAKE 1 TABLET BY  MOUTH EVERY DAY   . clopidogrel (PLAVIX) 75 MG tablet TAKE 1 TABLET BY MOUTH EVERY DAY   . diltiazem (CARDIZEM CD) 240 MG 24 hr capsule Take 1 capsule (240 mg total) by mouth daily.   . furosemide (LASIX) 40 MG tablet Take 1 tablet (40 mg total) by mouth daily.   Marland Kitchen glimepiride (AMARYL) 4 MG tablet Take 0.5 tablets (2 mg total) by mouth daily with breakfast.   . Insulin Pen Needle 29G X 12.7MM MISC 1 each by Does not apply route 1 day or 1 dose.   . Lancets (ACCU-CHEK MULTICLIX) lancets Once daily 250.00   . Liraglutide (VICTOZA) 18 MG/3ML SOPN Inject 1.8 mg into the skin daily. Takes 0.59m's   . metFORMIN (GLUCOPHAGE) 1000 MG tablet TAKE 1 TABLET TWICE DAILY WITH A MEAL   . metoprolol (LOPRESSOR) 100 MG tablet Take 1 tablet (100 mg total) by mouth 2 (two) times daily.   .Marland KitchenNITROSTAT 0.4 MG SL tablet PLACE 1 TABLET UNDER TONGUE EVERY 5 MINUTES AS NEEDED   . oxyCODONE (OXY IR/ROXICODONE) 5 MG immediate release tablet Take 1 tablet (5 mg total) by mouth every 6 (six) hours as needed for moderate pain or severe pain. 11/13/2015: Only as needed.   . polyethylene glycol powder (GLYCOLAX/MIRALAX) powder Take 17 g by mouth 2 (two) times daily as needed. (Patient taking differently: Take 17 g by mouth 2 (two) times daily as needed for mild constipation. )   . [DISCONTINUED] amoxicillin-clavulanate (AUGMENTIN) 875-125 MG tablet Take  1 tablet by mouth every 12 (twelve) hours. For 7days    No facility-administered encounter medications on file as of 11/27/2015.     Functional Status:   In your present state of health, do you have any difficulty performing the following activities: 11/04/2015 11/04/2015  Hearing? - N  Vision? - N  Difficulty concentrating or making decisions? - N  Walking or climbing stairs? - Y  Dressing or bathing? - N  Doing errands, shopping? N -  Preparing Food and eating ? - -  Using the Toilet? - -  In the past six months, have you accidently leaked urine? - -  Do you have  problems with loss of bowel control? - -  Managing your Medications? - -  Managing your Finances? - -  Housekeeping or managing your Housekeeping? - -  Some recent data might be hidden    Fall/Depression Screening:    PHQ 2/9 Scores 10/11/2015 04/22/2015 03/07/2014 10/31/2013 10/07/2012  PHQ - 2 Score 0 0 0 0 0    Assessment:  Uncontrolled HTN                         AAA  Plan: Called Dr. Martinique to advise of elevated BP readings.           Watched Emmi program on Endovascular AAA repair.           DR Doug Sou OFFICE CALLED BACK TO [facility] WILL START                  PT ON HYDRALAZINE 10 MG TID.           I will call pt next week to see how his BP readings are doing and                  see him in another 2 weeks.  [facility] CM Care Plan Problem One   Flowsheet Row Most Recent Value  Care Plan Problem One  New diagnosis of CHF.  Role Documenting the Problem One  Care Management [place] for Problem One  Active  THN Long Term Goal (31-90 days)  Pt will not readmit due to HF over the next 31 days.  THN Long Term Goal Start Date  10/11/15  THN Long Term Goal Met Date  11/27/15  Interventions for Problem One Long Term Goal  Reinforced following CHF action plan for the rest of his life.  THN CM Short Term Goal #1 (0-30 days)  Pt will verbalize knowledge of disease process, pathophysiology at the end of 30 days.  THN CM Short Term Goal #1 Start Date  10/11/15  [facility] CM Short Term Goal #1 Met Date  11/27/15  Interventions for Short Term Goal #1  Pt will need constant reinforcement and explanation about why he should do these actions.  THN CM Short Term Goal #2 (0-30 days)  Pt will be able to discuss which meds he takes for HF and what they do at the end of 30 days.  THN CM Short Term Goal #2 Start Date  10/11/15  [facility] CM Short Term Goal #2 Met Date  11/27/15  Interventions for Short Term Goal #2  Reviewed medications.  THN CM Short Term Goal #3 (0-30 days)  Pt will be educated about low  salt diet and things to avoid and will be able to discuss at the end of 30 days.  THN CM Short Term Goal #3 Start  Date  11/13/15  Interventions for Short Tern Goal #3  Expained again whay avoiding salt is necessary to not get fluid overload.     Deloria Lair [facility] [place] [phone]

## 2015-11-27 NOTE — Telephone Encounter (Signed)
Returned call to [place] with [facility].She was calling to report patient's elevated B/P.Stated he is taking medications as prescribed.Advised I will send message to [place] for advice.

## 2015-11-27 NOTE — Telephone Encounter (Signed)
Agree BP is too high. Altace was held due to renal insufficiency. He is already on good doses on diltiazem, metoprolol, and lasix. We can add hydralazine 10 mg tid. He is scheduled to see Dr. Trula Slade next week.  Peter Martinique MD, [facility]

## 2015-11-27 NOTE — Telephone Encounter (Signed)
Returned call to [employer] with [facility].Dr.Jordan's recommendations given. Spoke to patient's wife Dr.Jordan advised to start Hydralazine 10 mg three times a day.Advised to continue all other medications. Advised to continue to monitor B/P and call back if it does not improve.Advised to keep appointment with Dr.Brabham next week.

## 2015-11-27 NOTE — Telephone Encounter (Signed)
New message      Pt c/o BP issue: STAT if pt c/o blurred vision, one-sided weakness or slurred speech  1. What are your last 5 BP readings? 160/80 and average 144-197/89-109 2. Are you having any other symptoms (ex. Dizziness, headache, blurred vision, passed out)? no 3. What is your BP issue? Patient's bp is too high especially since he has an AAA.  Please advise---OK to call pt at [phone]

## 2015-11-29 NOTE — Patient Outreach (Signed)
[place] [facility]) Care Management   11/29/2015  Jason Fisher [birth year] [id number]  Jason Fisher is an 75 y.o. male  Subjective: See note from 10/25 PLEASE  Objective:   Review of Systems  Constitutional: Negative.   HENT: Negative.   Eyes: Negative.   Respiratory: Positive for cough.        Occasional.  Cardiovascular: Negative.   Gastrointestinal: Negative.   Genitourinary: Negative.   Musculoskeletal: Negative.   Skin: Negative.   Neurological: Negative.   Endo/Heme/Allergies: Bruises/bleeds easily.  Psychiatric/Behavioral: Negative.    BP (!) 160/80 (BP Location: Right Arm, Patient Position: Sitting)   Pulse 80 Comment: Irregular, AFIB  Resp 16   Wt 193 lb (87.5 kg)   SpO2 98%   BMI 29.35 kg/m   Physical Exam  Constitutional: He is oriented to person, place, and time. He appears well-developed and well-nourished.  HENT:  Head: Normocephalic and atraumatic.  Cardiovascular:  Irregular - AFIB  Respiratory: Effort normal and breath sounds normal.  GI: Soft. Bowel sounds are normal.  Musculoskeletal: Normal range of motion.  Neurological: He is alert and oriented to person, place, and time.  Skin: Skin is warm and dry.  Psychiatric: He has a normal mood and affect.    Encounter Medications:   Outpatient Encounter Prescriptions as of 11/27/2015  Medication Sig Note  . ACCU-CHEK AVIVA PLUS test strip CHECK BLOOD SUGAR ONCE DAILY AS NEEDED   . apixaban (ELIQUIS) 5 MG TABS tablet Take 1 tablet (5 mg total) by mouth 2 (two) times daily.   Marland Kitchen atorvastatin (LIPITOR) 20 MG tablet TAKE 1 TABLET BY MOUTH EVERY DAY   . clopidogrel (PLAVIX) 75 MG tablet TAKE 1 TABLET BY MOUTH EVERY DAY   . diltiazem (CARDIZEM CD) 240 MG 24 hr capsule Take 1 capsule (240 mg total) by mouth daily.   . furosemide (LASIX) 40 MG tablet Take 1 tablet (40 mg total) by mouth daily.   Marland Kitchen glimepiride (AMARYL) 4 MG tablet Take 0.5 tablets (2 mg total) by mouth daily with breakfast.    . Insulin Pen Needle 29G X 12.7MM MISC 1 each by Does not apply route 1 day or 1 dose.   . Lancets (ACCU-CHEK MULTICLIX) lancets Once daily 250.00   . Liraglutide (VICTOZA) 18 MG/3ML SOPN Inject 1.8 mg into the skin daily. Takes 0.26ml's   . metFORMIN (GLUCOPHAGE) 1000 MG tablet TAKE 1 TABLET TWICE DAILY WITH A MEAL   . metoprolol (LOPRESSOR) 100 MG tablet Take 1 tablet (100 mg total) by mouth 2 (two) times daily.   Marland Kitchen NITROSTAT 0.4 MG SL tablet PLACE 1 TABLET UNDER TONGUE EVERY 5 MINUTES AS NEEDED   . oxyCODONE (OXY IR/ROXICODONE) 5 MG immediate release tablet Take 1 tablet (5 mg total) by mouth every 6 (six) hours as needed for moderate pain or severe pain. 11/13/2015: Only as needed.   . polyethylene glycol powder (GLYCOLAX/MIRALAX) powder Take 17 g by mouth 2 (two) times daily as needed. (Patient taking differently: Take 17 g by mouth 2 (two) times daily as needed for mild constipation. )   . [DISCONTINUED] amoxicillin-clavulanate (AUGMENTIN) 875-125 MG tablet Take 1 tablet by mouth every 12 (twelve) hours. For 7days    No facility-administered encounter medications on file as of 11/27/2015.     Functional Status:   In your present state of health, do you have any difficulty performing the following activities: 11/04/2015 11/04/2015  Hearing? - N  Vision? - N  Difficulty concentrating or making decisions? -  N  Walking or climbing stairs? - Y  Dressing or bathing? - N  Doing errands, shopping? N -  Preparing Food and eating ? - -  Using the Toilet? - -  In the past six months, have you accidently leaked urine? - -  Do you have problems with loss of bowel control? - -  Managing your Medications? - -  Managing your Finances? - -  Housekeeping or managing your Housekeeping? - -  Some recent data might be hidden    Fall/Depression Screening:    PHQ 2/9 Scores 10/11/2015 04/22/2015 03/07/2014 10/31/2013 10/07/2012  PHQ - 2 Score 0 0 0 0 0    Assessment:    Plan:

## 2015-11-29 NOTE — Patient Outreach (Signed)
[facility]) Care Management  11/29/2015  [name] 12/18/1940 [id number]   See note from 11/27/15.  Deloria Lair [facility] [place] [phone]

## 2015-12-04 ENCOUNTER — Ambulatory Visit: Payer: Self-pay | Admitting: *Deleted

## 2015-12-04 ENCOUNTER — Encounter: Payer: Self-pay | Admitting: Surgery

## 2015-12-04 ENCOUNTER — Telehealth: Payer: Self-pay | Admitting: Cardiology

## 2015-12-04 ENCOUNTER — Other Ambulatory Visit: Payer: Self-pay | Admitting: *Deleted

## 2015-12-04 MED ORDER — HYDRALAZINE HCL 25 MG PO TABS: 25.0000 mg | ORAL_TABLET | Freq: Three times a day (TID) | ORAL | 6 refills | Status: DC

## 2015-12-04 NOTE — Telephone Encounter (Signed)
New message   Range from [phone] - [id number] systolic   Range from  90 -[id number] dystolic     Patient taken metoprolol 100 mg twice a day    Cardizem CD 240 mg daily   hydralazine  10 mg 3 time a day  Lasix  40 mg   H/O abdomen aortic anyserum,

## 2015-12-04 NOTE — Telephone Encounter (Signed)
Forward and defer information to Dr Martinique   Information received by Deloria Lair  GNP-BC-   [place]  Patient Outreach Telephone Open    12/04/2015 [place], NP   Hypertension, Transitions Of Care  Reason for Visit   Conversation: Hypertension  (Newest Message First)  Deloria Lair, NP      12/04/15 4:05 PM  Incomplete Note    Transition of care call. Pt reports he is feeling pretty good his weight is stable, 194 today. He has been taking the new BP med (Hydralazine 10 mg) ordered by Dr. Martinique last week when we discovered his blood pressure was not in good control. He tells me his blood pressures have not improved at all since he has been taking the hydralazine 10mg  tid. His systolic readings are [id number] and diastoloic 90 -110. I have told Mr. Billick that I will call Dr. Doug Sou office and report this and see what the next step should be.  I called Dr. Doug Sou office and left a detailed message with the triage nurse and requested a call back.  I will be seeing Mr. Porzio next week.  Deloria Lair [facility] [place] [phone]

## 2015-12-04 NOTE — Telephone Encounter (Signed)
Spoke [place] advised DOD Dr.Croitoru increased Hydralazine to 25 mg three times a day.Stated she will be seeing patient in 1 week she will call back and report B/P readings.

## 2015-12-04 NOTE — Telephone Encounter (Signed)
Returned call to patient.Dr.Jordan out of office.Spoke to DOD Dr.Croitoru he advised to increase Hydralazine to 25 mg three times a day.Advised continue to monitor B/P and call back in 1 week if B/P still elevated.

## 2015-12-04 NOTE — Patient Outreach (Signed)
Transition of care call. Pt reports he is feeling pretty good his weight is stable, 194 today. He has been taking the new BP med (Hydralazine 10 mg) ordered by Dr. Martinique last week when we discovered his blood pressure was not in good control. He tells me his blood pressures have not improved at all since he has been taking the hydralazine 10mg  tid. His systolic readings are [id number] and diastoloic 90 -110. I have told Mr. Lovin that I will call Dr. Doug Sou office and report this and see what the next step should be.  I called Dr. Doug Sou office and left a detailed message with the triage nurse and requested a call back.  I will be seeing Mr. Humann next week.  Deloria Lair [facility] [place] [phone]

## 2015-12-05 ENCOUNTER — Other Ambulatory Visit: Payer: Self-pay | Admitting: Cardiology

## 2015-12-06 ENCOUNTER — Encounter: Payer: Self-pay | Admitting: *Deleted

## 2015-12-06 DIAGNOSIS — Z006 Encounter for examination for normal comparison and control in clinical research program: Secondary | ICD-10-CM

## 2015-12-06 NOTE — Progress Notes (Signed)
LEADERS FREE II month 2 telephone call made on 12/04/15. Patient has not had any adverse events (other than hospitalization for hemorrhoids) No changes in medication other than raising dose of blood pressure medication. Next research study visit (month 6) will be due no later than 04/08/16 and the research office will call to schedule. Questions encouraged and answered.

## 2015-12-09 ENCOUNTER — Inpatient Hospital Stay: Payer: Medicare Other | Admitting: Emergency Medicine

## 2015-12-09 ENCOUNTER — Ambulatory Visit (INDEPENDENT_AMBULATORY_CARE_PROVIDER_SITE_OTHER): Payer: Medicare Other | Admitting: Surgery

## 2015-12-09 ENCOUNTER — Encounter: Payer: Self-pay | Admitting: Surgery

## 2015-12-09 VITALS — BP 155/88 | HR 70 | Temp 97.4°F | Resp 16 | Ht 68.0 in | Wt 195.0 lb

## 2015-12-09 DIAGNOSIS — I714 Abdominal aortic aneurysm, without rupture, unspecified: Secondary | ICD-10-CM

## 2015-12-09 NOTE — Progress Notes (Signed)
Vitals:   12/09/15 1319  BP: (!) 153/88  Pulse: 81  Resp: 16  Temp: 97.4 F (36.3 C)  SpO2: 96%  Weight: 195 lb (88.5 kg)  Height: 5\' 8"  (1.727 m)

## 2015-12-09 NOTE — Progress Notes (Signed)
Vascular and Vein Specialist of [facility]  Patient name: Jason Fisher MRN: [id number] DOB: 12-27-1940 Sex: male  REFERRING PHYSICIAN: ER  REASON FOR CONSULT: AAA  HPI: Jason Fisher is a 75 y.o. male, who is referred for evaluation of an abdominal aortic aneurysm.  The patient had a CT angiogram in 2009 where the maximum diameter measured 3.6 cm.  A CT scan last month showed that it had grown to 5.2 cm.  The patient was in the hospital with rectal pain and found to have a perirectal abscess and thrombosed hemorrhoid.  This was treated operatively.  The patient was admitted in August 0000000 with diastolic heart failure.  He underwent stenting of the second obtuse marginal.  He developed pneumonia after his intervention.  He has a history of STEMI in 2007 requiring stenting 2.  He had in-stent stenosis in 2009 which was treated with drug-eluting stent.  He is medically managed for hypertension with an ACE inhibitor.  He takes a statin for hypercholesterolemia.  The patient is a diabetic his most recent hemoglobin A1c was 6.6.  He is a former smoker.  He does have chronic renal insufficiency.  He has a history of atrial fibrillation for which he takes Eliquis.  He is also on Plavix   Past Medical History:  Diagnosis Date  . 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 BENIGN PROSTATIC HYPERTROPHY, HX OF 02/24/2007  . CAD S/P PCI    a. 2007 Inf STEMI s/p Vision BMS  2 to RCA;  b. 2009 ISR Prox RCA Stent-->with DES;  c. 09/2015 PCI: LM nl, LAD 30p/m, D1 90, LCX nl, OM1 small, 80, OM2 90 (2.5x14 Biofreedom stent), OM3 small, RCA 30p ISR, 45m ISR, 77m, 85d, EF 50-55%.  . 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 Chronic atrial fibrillation ([place]) 11/06/2009   a. On Eliquis (CHA2DS2VASc = 5).  Rate controlled w/ BB and CCB.  Marland Kitchen Chronic diastolic CHF (congestive heart failure) ([place])    a. 09/2015 Echo: EF 55-60%, mildly dil LA.  . CKD (chronic kidney disease), stage III     . COLONIC POLYPS, HX OF 11/02/2006  . DIABETES MELLITUS, TYPE II 10/29/2006  . Diverticulosis   . History of tobacco abuse    a. Quit 09/2015.  Marland Kitchen HYPERLIPIDEMIA 10/29/2006  . Hypertensive heart disease with heart failure (HCC)     Family History  Problem Relation Age of Onset  . Heart attack Mother   . Diabetes Mother   . Heart attack Father   . Hypertension Father   . Heart attack Brother   . Diabetes Brother     SOCIAL HISTORY: Social History   Social History  . Marital status: Married    Spouse name: N/A  . Number of children: 5  . Years of education: N/A   Occupational History  . [employer]    Social History Main Topics  . Smoking status: Former Smoker    Types: Cigarettes    Quit date: 09/08/2015  . Smokeless tobacco: Never Used     Comment: passive smoker as well  . Alcohol use No  . Drug use: No  . Sexual activity: Not on file   Other Topics Concern  . Not on file   Social History Narrative  . No narrative on file    Allergies  Allergen Reactions  . Contrast Media [Iodinated Diagnostic Agents] Anaphylaxis    Current Outpatient Prescriptions  Medication Sig Dispense Refill  . ACCU-CHEK AVIVA PLUS test strip  CHECK BLOOD SUGAR ONCE DAILY AS NEEDED 100 each 4  . apixaban (ELIQUIS) 5 MG TABS tablet Take 1 tablet (5 mg total) by mouth 2 (two) times daily. 60 tablet 6  . atorvastatin (LIPITOR) 20 MG tablet TAKE 1 TABLET BY MOUTH EVERY DAY 90 tablet 1  . clopidogrel (PLAVIX) 75 MG tablet TAKE 1 TABLET BY MOUTH EVERY DAY 90 tablet 3  . diltiazem (CARDIZEM CD) 240 MG 24 hr capsule Take 1 capsule (240 mg total) by mouth daily. 30 capsule 1  . furosemide (LASIX) 40 MG tablet TAKE 1 TABLET (40 MG TOTAL) BY MOUTH DAILY. 30 tablet 1  . glimepiride (AMARYL) 4 MG tablet Take 0.5 tablets (2 mg total) by mouth daily with breakfast. 90 tablet 3  . hydrALAZINE (APRESOLINE) 25 MG tablet Take 1 tablet (25 mg total) by mouth 3 (three) times daily. 270 tablet 6  . Insulin  Pen Needle 29G X 12.7MM MISC 1 each by Does not apply route 1 day or 1 dose. 100 each 3  . Lancets (ACCU-CHEK MULTICLIX) lancets Once daily 250.00 100 each 3  . Liraglutide (VICTOZA) 18 MG/3ML SOPN Inject 1.8 mg into the skin daily. Takes 0.9ml's    . metFORMIN (GLUCOPHAGE) 1000 MG tablet TAKE 1 TABLET TWICE DAILY WITH A MEAL 180 tablet 1  . metoprolol (LOPRESSOR) 100 MG tablet Take 1 tablet (100 mg total) by mouth 2 (two) times daily. 180 tablet 1  . NITROSTAT 0.4 MG SL tablet PLACE 1 TABLET UNDER TONGUE EVERY 5 MINUTES AS NEEDED 25 tablet 0  . oxyCODONE (OXY IR/ROXICODONE) 5 MG immediate release tablet Take 1 tablet (5 mg total) by mouth every 6 (six) hours as needed for moderate pain or severe pain. 30 tablet 0  . polyethylene glycol powder (GLYCOLAX/MIRALAX) powder Take 17 g by mouth 2 (two) times daily as needed. (Patient taking differently: Take 17 g by mouth 2 (two) times daily as needed for mild constipation. ) 3350 g 1  . amoxicillin-clavulanate (AUGMENTIN) 875-125 MG tablet     . hydrALAZINE (APRESOLINE) 10 MG tablet      No current facility-administered medications for this visit.     REVIEW OF SYSTEMS:  [X]  denotes positive finding, [ ]  denotes negative finding Cardiac  Comments:  Chest pain or chest pressure: x   Shortness of breath upon exertion:    Short of breath when lying flat:    Irregular heart rhythm: x       Vascular    Pain in calf, thigh, or hip brought on by ambulation:    Pain in feet at night that wakes you up from your sleep:     Blood clot in your veins:    Leg swelling:         Pulmonary    Oxygen at home:    Productive cough:     Wheezing:         Neurologic    Sudden weakness in arms or legs:     Sudden numbness in arms or legs:     Sudden onset of difficulty speaking or slurred speech:    Temporary loss of vision in one eye:     Problems with dizziness:         Gastrointestinal    Blood in stool:  x   Vomited blood:         Genitourinary     Burning when urinating:     Blood in urine:        Psychiatric  Major depression:         Hematologic    Bleeding problems: x   Problems with blood clotting too easily:        Skin    Rashes or ulcers:        Constitutional    Fever or chills:      PHYSICAL EXAM: Vitals:   12/09/15 1319 12/09/15 1324  BP: (!) 153/88 (!) 155/88  Pulse: 81 70  Resp: 16   Temp: 97.4 F (36.3 C)   SpO2: 96%   Weight: 195 lb (88.5 kg)   Height: 5\' 8"  (1.727 m)     GENERAL: The patient is a well-nourished male, in no acute distress. The vital signs are documented above. CARDIAC: There is a regular rate and rhythm.  VASCULAR: Nonpalpable pedal pulses.  No carotid bruits.  PULMONARY: There is good air exchange bilaterally without wheezing or rales. ABDOMEN: Soft and non-tender with normal pitched bowel sounds.  aneurysm is difficult to feel.  Abdomen is nontender  MUSCULOSKELETAL: There are no major deformities or cyanosis. NEUROLOGIC: No focal weakness or paresthesias are detected. SKIN: There are no ulcers or rashes noted. PSYCHIATRIC: The patient has a normal affect.  DATA:I have reviewed his CT scan which was a noncontrast, 5 mm scan which shows a 5.2 cm infrarenal aortic aneurysm.  I've also reviewed his CT angiographic 2009.  He does have mural thrombus on the posterior wall of the aorta just above the renal arteries.   ASSESSMENT AND PLAN:  5.2 cm infrarenal abdominal aortic aneurysm: I discussed with the patient that I would consider elective repair, however before doing so I would need more information.  I'm ordering a CT angiogram to fully define his anatomy and to plan his repair.  He will also need preoperative carotid duplex as well as lower extremity duplex and ABIs.  Pulmonary nodules: The patient is supposed to see Dr. Lamonte Sakai tomorrow.  I had the patient scheduled for follow-up with me again in 1-2 weeks   Annamarie Major, MD Vascular and Vein Specialists of [facility]  [phone] Pager [phone]

## 2015-12-10 ENCOUNTER — Ambulatory Visit (INDEPENDENT_AMBULATORY_CARE_PROVIDER_SITE_OTHER): Payer: Medicare Other | Admitting: Emergency Medicine

## 2015-12-10 ENCOUNTER — Encounter: Payer: Self-pay | Admitting: Emergency Medicine

## 2015-12-10 ENCOUNTER — Other Ambulatory Visit: Payer: Self-pay

## 2015-12-10 VITALS — BP 130/70 | HR 89 | Ht 68.0 in | Wt 197.8 lb

## 2015-12-10 DIAGNOSIS — Z720 Tobacco use: Secondary | ICD-10-CM

## 2015-12-10 DIAGNOSIS — R911 Solitary pulmonary nodule: Secondary | ICD-10-CM | POA: Diagnosis not present

## 2015-12-10 DIAGNOSIS — I714 Abdominal aortic aneurysm, without rupture, unspecified: Secondary | ICD-10-CM

## 2015-12-10 DIAGNOSIS — F17200 Nicotine dependence, unspecified, uncomplicated: Secondary | ICD-10-CM | POA: Diagnosis not present

## 2015-12-10 DIAGNOSIS — I6529 Occlusion and stenosis of unspecified carotid artery: Secondary | ICD-10-CM

## 2015-12-10 DIAGNOSIS — I739 Peripheral vascular disease, unspecified: Secondary | ICD-10-CM

## 2015-12-10 MED ORDER — DIPHENHYDRAMINE HCL 25 MG PO CAPS: ORAL_CAPSULE | ORAL | 0 refills | Status: DC

## 2015-12-10 MED ORDER — PREDNISONE 50 MG PO TABS: ORAL_TABLET | ORAL | 0 refills | Status: DC

## 2015-12-10 NOTE — Assessment & Plan Note (Signed)
Allergy unclear but he is at risk for possible malignancy. I believe he needs a repeat CT scan of the chest, super D cuts in December 2017 to compare with his October scan. If the nodules are enlarging then we will likely arrange for biopsy and/or PET scan.

## 2015-12-10 NOTE — Patient Instructions (Signed)
We will perform CT scan of the chest without contrast in mid December 2017 to compare with your prior. If your pulmonary nodules have resolved on your upcoming CT scans minute to evaluate your aneurysm then we may be able to cancel the December scan. We will arrange for full pulmonary function testing. Follow with Dr Lamonte Sakai in December after your CT scan to review the results.

## 2015-12-10 NOTE — Assessment & Plan Note (Signed)
He recently quit.  Full PFT and consider BD's as we go forward depending in sx and PFT results.

## 2015-12-10 NOTE — Progress Notes (Signed)
Subjective:    Patient ID: Jason Fisher, male    DOB: 02-17-1940, 75 y.o.   MRN: [id number]  HPI 75 year old former smoker (50 pack-yrs) with hx CAD/PTCI, carotid artery disease, chronic atrial fibrillation on anticoagulation, HTN with dCHF. Also history AAA, 5.2 cm. He was omitted to the hospital in early October 2017 for a perirectal abscess which was treated by I&D and then IV abx. As part of that evaluation a CT scan of his abdomen and some scoliosis CT scan of his chest revealed bilateral irregular scattered very nodular lesions. The largest is 1.5 x 1.4 cm in the lingula. Also noted was some mild mediastinal lymphadenopathy. The nodules were felt to be potentially consistent with either an infectious process, noninfectious inflammatory process or malignancy. He presents now for further follow up. He finished his abx last week. He denies any resp sx while he was in the hospital. He is to have CT angio soon to better eval his AAA.   He notes exertional fatigue, no real dyspnea w normal walking. He does get dyspnea w stairs. He is not having much cough. Occasional mucous, no hemoptysis. He is on plavix and eliquis.    Review of Systems As per [facility]  Past Medical History:  Diagnosis Date  . 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 BENIGN PROSTATIC HYPERTROPHY, HX OF 02/24/2007  . CAD S/P PCI    a. 2007 Inf STEMI s/p Vision BMS  2 to RCA;  b. 2009 ISR Prox RCA Stent-->with DES;  c. 09/2015 PCI: LM nl, LAD 30p/m, D1 90, LCX nl, OM1 small, 80, OM2 90 (2.5x14 Biofreedom stent), OM3 small, RCA 30p ISR, 90m ISR, 36m, 85d, EF 50-55%.  . 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 Chronic atrial fibrillation ([place]) 11/06/2009   a. On Eliquis (CHA2DS2VASc = 5).  Rate controlled w/ BB and CCB.  Marland Kitchen Chronic diastolic CHF (congestive heart failure) ([place])    a. 09/2015 Echo: EF 55-60%, mildly dil LA.  . CKD (chronic kidney disease), stage III   . COLONIC POLYPS, HX OF 11/02/2006  . DIABETES MELLITUS, TYPE II  10/29/2006  . Diverticulosis   . History of tobacco abuse    a. Quit 09/2015.  Marland Kitchen HYPERLIPIDEMIA 10/29/2006  . Hypertensive heart disease with heart failure (HCC)      Family History  Problem Relation Age of Onset  . Heart attack Mother   . Diabetes Mother   . Heart attack Father   . Hypertension Father   . Heart attack Brother   . Diabetes Brother      Social History   Social History  . Marital status: Married    Spouse name: N/A  . Number of children: 5  . Years of education: N/A   Occupational History  . [employer]    Social History Main Topics  . Smoking status: Former Smoker    Types: Cigarettes    Quit date: 09/08/2015  . Smokeless tobacco: Never Used     Comment: passive smoker as well  . Alcohol use No  . Drug use: No  . Sexual activity: Not on file   Other Topics Concern  . Not on file   Social History Narrative  . 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   Allergies  Allergen Reactions  . Contrast Media [Iodinated Diagnostic Agents] Anaphylaxis     Outpatient Medications Prior to Visit  Medication Sig  Dispense Refill  . ACCU-CHEK AVIVA PLUS test strip CHECK BLOOD SUGAR ONCE DAILY AS NEEDED 100 each 4  . amoxicillin-clavulanate (AUGMENTIN) 875-125 MG tablet     . apixaban (ELIQUIS) 5 MG TABS tablet Take 1 tablet (5 mg total) by mouth 2 (two) times daily. 60 tablet 6  . atorvastatin (LIPITOR) 20 MG tablet TAKE 1 TABLET BY MOUTH EVERY DAY 90 tablet 1  . clopidogrel (PLAVIX) 75 MG tablet TAKE 1 TABLET BY MOUTH EVERY DAY 90 tablet 3  . diltiazem (CARDIZEM CD) 240 MG 24 hr capsule Take 1 capsule (240 mg total) by mouth daily. 30 capsule 1  . furosemide (LASIX) 40 MG tablet TAKE 1 TABLET (40 MG TOTAL) BY MOUTH DAILY. 30 tablet 1  . glimepiride (AMARYL) 4 MG tablet Take 0.5 tablets (2 mg total) by mouth daily with breakfast. 90 tablet 3  . hydrALAZINE (APRESOLINE) 10 MG tablet     .  hydrALAZINE (APRESOLINE) 25 MG tablet Take 1 tablet (25 mg total) by mouth 3 (three) times daily. 270 tablet 6  . Insulin Pen Needle 29G X 12.7MM MISC 1 each by Does not apply route 1 day or 1 dose. 100 each 3  . Lancets (ACCU-CHEK MULTICLIX) lancets Once daily 250.00 100 each 3  . Liraglutide (VICTOZA) 18 MG/3ML SOPN Inject 1.8 mg into the skin daily. Takes 0.21ml's    . metFORMIN (GLUCOPHAGE) 1000 MG tablet TAKE 1 TABLET TWICE DAILY WITH A MEAL 180 tablet 1  . metoprolol (LOPRESSOR) 100 MG tablet Take 1 tablet (100 mg total) by mouth 2 (two) times daily. 180 tablet 1  . NITROSTAT 0.4 MG SL tablet PLACE 1 TABLET UNDER TONGUE EVERY 5 MINUTES AS NEEDED 25 tablet 0  . oxyCODONE (OXY IR/ROXICODONE) 5 MG immediate release tablet Take 1 tablet (5 mg total) by mouth every 6 (six) hours as needed for moderate pain or severe pain. 30 tablet 0  . polyethylene glycol powder (GLYCOLAX/MIRALAX) powder Take 17 g by mouth 2 (two) times daily as needed. (Patient taking differently: Take 17 g by mouth 2 (two) times daily as needed for mild constipation. ) 3350 g 1   No facility-administered medications prior to visit.          Objective:   Physical Exam Vitals:   12/10/15 1107  BP: 130/70  Pulse: 89  SpO2: 96%  Weight: 197 lb 12.8 oz (89.7 kg)  Height: 5\' 8"  (1.727 m)   Gen: Pleasant, well-nourished, in no distress,  normal affect  ENT: No lesions,  mouth clear,  oropharynx clear, no postnasal drip  Neck: No JVD, no TMG, no carotid bruits  Lungs: No use of accessory muscles, clear without rales or rhonchi  Cardiovascular: RRR, heart sounds normal, no murmur or gallops, no peripheral edema  Musculoskeletal: No deformities, no cyanosis or clubbing  Neuro: alert, non focal  Skin: Warm, no lesions or rashes   11/06/15 -- CT chest  COMPARISON:  CT abdomen and pelvis including lung bases November 04, 2015; chest radiograph September 29, 2015  FINDINGS: Cardiovascular: Prominence in the ascending  thoracic aorta is noted with a measured transverse diameter of 4.0 x 3.7 cm. The aorta is tortuous in the descending thoracic aorta without apparent aneurysmal dilatation. There is no thoracic aortic dissection. The visualized great vessels show moderate atherosclerotic calcification at the origins of the right common carotid and left subclavian artery. There is no appreciable pericardial thickening. There are multiple foci of coronary artery calcification. No evident pulmonary embolus.  Mediastinum/Nodes:  Thyroid appears unremarkable. There are scattered subcentimeter mediastinal lymph nodes. There is a sub- carinal lymph node measuring 1.5 x 1.4 cm. There is a lymph node in the right hilum measuring 1.1 x 0.9 cm.  Lungs/Pleura: There is somewhat irregular opacity in the superior lingula medially with a nodular appearing area measuring 1.0 x 1.0 cm, best seen on axial slice 89 series 3. This finding is also apparent on sagittal slice 82 series 5. On axial slice 0000000 series 3 and sagittal slice [id number] series 5, there is a nodular appearing opacity with subtle cavitation measuring 1.5 x 1.1 cm in the inferior lingula. There is a somewhat spiculated appearing lesion in the medial segment of the right lower lobe measuring 1.5 x 1.4 cm. This lesion is best seen on axial slice 123456 series 3. There is a somewhat irregular nodular appearing opacity in the posterior segment of the left lower lobe measuring 1.2 x 1.0 cm, best seen on axial slice [id number] series 3. There is scarring in each lung base as well. There is a nodular opacity in the posterior segment right lower lobe abutting the pleura measuring 1.0 x 0.7 cm, best seen on axial slice [id number] series 3. There is lower lobe bronchiectatic change bilaterally, slightly more severe on the right than on the left. There is no appreciable pleural effusion. There is mild left base atelectatic change.  Upper Abdomen: In the visualized upper abdomen,  there is atherosclerotic calcification in the aorta. Incomplete visualization of abdominal aortic aneurysm noted, described on recent CT abdomen. Liver contour is somewhat lobular suggesting a degree of underlying cirrhosis. Scattered liver granulomas noted. No new lesion identified in the upper abdomen compared to 2 days prior.  Musculoskeletal: There is degenerative change in the thoracic spine. No blastic or lytic bone lesions are evident. 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  IMPRESSION: Irregular nodular opacities in the lung parenchyma as summarized above. Subtle questionable cavitation in a lesion in the inferior lingula noted. Largest nodular opacity measures 1.5 x 1.4 cm. This appearance is concerning for multifocal neoplasm. In the appropriate clinical setting, septic emboli could present similarly. There are mildly prominent sub- carinal and right hilar lymph nodes which could be either of malignant or reactive etiology. Given concern for potential multifocal neoplasm and possible lymph node involvement, nuclear medicine PET study advised to further evaluate.      Assessment & Plan:  Lung nodules Allergy unclear but he is at risk for possible malignancy. I believe he needs a repeat CT scan of the chest, super D cuts in December 2017 to compare with his October scan. If the nodules are enlarging then we will likely arrange for biopsy and/or PET scan.  TOBACCO USER He recently quit.  Full PFT and consider BD's as we go forward depending in sx and PFT results.   Baltazar Apo, MD, PhD 12/10/2015, 11:41 AM [facility] [phone] or if no answer [phone]

## 2015-12-11 ENCOUNTER — Telehealth: Payer: Self-pay | Admitting: Cardiology

## 2015-12-11 ENCOUNTER — Other Ambulatory Visit: Payer: Self-pay

## 2015-12-11 ENCOUNTER — Other Ambulatory Visit: Payer: Self-pay | Admitting: *Deleted

## 2015-12-11 DIAGNOSIS — I714 Abdominal aortic aneurysm, without rupture, unspecified: Secondary | ICD-10-CM

## 2015-12-11 NOTE — Telephone Encounter (Signed)
New message       Pt c/o BP issue: STAT if pt c/o blurred vision, one-sided weakness or slurred speech  1. What are your last 5 BP readings? [id number] systolic range and [id number] dystolic range-----today bp is 160/100 2. Are you having any other symptoms (ex. Dizziness, headache, blurred vision, passed out)? no 3. What is your BP issue? Hydralazine was increased from 10mg  to 25mg  tid.  Bp is still high. Please advise

## 2015-12-11 NOTE — Telephone Encounter (Signed)
LMTCB

## 2015-12-11 NOTE — Telephone Encounter (Signed)
I would increase hydralazine to 50 mg tid. Report back on BP in 2 weeks.   Peter Martinique MD, [facility]

## 2015-12-11 NOTE — Telephone Encounter (Signed)
Spoke with care coordinator, Arbie Cookey she states that pt has had AAA and she is concerned that pt's BP is still too high the recent increase in dose of Hydralazine is just not working, there is just no significant change in his BP (see below). Please advise.

## 2015-12-11 NOTE — Patient Outreach (Signed)
Jason Fisher [facility]) Care Management   12/11/2015  Jason Fisher Mar 21, 1940 [id number]  Jason Fisher is an 75 y.o. male  Subjective: Doing well, had pulmonary evaluation. He also has all his cardiovascular studies scheduled for next week. Jason Fisher checks his blood pressure daily and his blood pressure is still too high. His systolic readings have been 145-171 and [id number] diastolicaly. Pt is taking the increased hydralazine 25 mg tid.  Objective:   Review of Systems  Constitutional: Negative.   HENT: Negative.   Eyes: Negative.   Respiratory: Negative.   Cardiovascular: Negative.   Gastrointestinal: Negative.   Genitourinary: Negative.   Musculoskeletal: Negative.   Skin: Negative.   Neurological: Negative.   Endo/Heme/Allergies: Negative.   Psychiatric/Behavioral: Negative.    BP (!) 160/100 (BP Location: Right Arm, Patient Position: Sitting, Cuff Size: Normal)   Pulse 94 Comment: AFIB  Resp 18   Wt 192 lb (87.1 kg)   SpO2 94%   BMI 29.19 kg/m  FBS: 101  Physical Exam  Constitutional: He is oriented to person, place, and time. He appears well-developed and well-nourished.  Cardiovascular:  AFIB.  Respiratory: Effort normal and breath sounds normal.  GI: Soft. Bowel sounds are normal.  Musculoskeletal: Normal range of motion.  Neurological: He is alert and oriented to person, place, and time.  Skin: Skin is warm and dry.  Psychiatric: He has a normal mood and affect.    Encounter Medications:   Outpatient Encounter Prescriptions as of 12/11/2015  Medication Sig Note  . ACCU-CHEK AVIVA PLUS test strip CHECK BLOOD SUGAR ONCE DAILY AS NEEDED   . amoxicillin-clavulanate (AUGMENTIN) 875-125 MG tablet  12/09/2015: Received from: External Pharmacy  . apixaban (ELIQUIS) 5 MG TABS tablet Take 1 tablet (5 mg total) by mouth 2 (two) times daily.   Marland Kitchen atorvastatin (LIPITOR) 20 MG tablet TAKE 1 TABLET BY MOUTH EVERY DAY   . clopidogrel (PLAVIX) 75 MG tablet TAKE 1  TABLET BY MOUTH EVERY DAY   . diltiazem (CARDIZEM CD) 240 MG 24 hr capsule Take 1 capsule (240 mg total) by mouth daily.   . diphenhydrAMINE (BENADRYL) 25 mg capsule Take 50 mg by mouth 1 hour prior to your procedure.   . furosemide (LASIX) 40 MG tablet TAKE 1 TABLET (40 MG TOTAL) BY MOUTH DAILY.   Marland Kitchen glimepiride (AMARYL) 4 MG tablet Take 0.5 tablets (2 mg total) by mouth daily with breakfast.   . hydrALAZINE (APRESOLINE) 25 MG tablet Take 1 tablet (25 mg total) by mouth 3 (three) times daily.   . Insulin Pen Needle 29G X 12.7MM MISC 1 each by Does not apply route 1 day or 1 dose.   . Lancets (ACCU-CHEK MULTICLIX) lancets Once daily 250.00   . Liraglutide (VICTOZA) 18 MG/3ML SOPN Inject 1.8 mg into the skin daily. Takes 0.61ml's   . metFORMIN (GLUCOPHAGE) 1000 MG tablet TAKE 1 TABLET TWICE DAILY WITH A MEAL   . metoprolol (LOPRESSOR) 100 MG tablet Take 1 tablet (100 mg total) by mouth 2 (two) times daily.   Marland Kitchen NITROSTAT 0.4 MG SL tablet PLACE 1 TABLET UNDER TONGUE EVERY 5 MINUTES AS NEEDED   . oxyCODONE (OXY IR/ROXICODONE) 5 MG immediate release tablet Take 1 tablet (5 mg total) by mouth every 6 (six) hours as needed for moderate pain or severe pain. 11/13/2015: Only as needed.   . polyethylene glycol powder (GLYCOLAX/MIRALAX) powder Take 17 g by mouth 2 (two) times daily as needed. (Patient taking differently: Take 17 g by mouth  2 (two) times daily as needed for mild constipation. )   . predniSONE (DELTASONE) 50 MG tablet One tablet (50mg ) 13 hours prior to procedure; one tablet (50mg ) 7 hours prior to procedure and then one tablet (50 mg) one hour prior to procedure.   . [DISCONTINUED] hydrALAZINE (APRESOLINE) 10 MG tablet  12/09/2015: Received from: External Pharmacy   No facility-administered encounter medications on file as of 12/11/2015.     Functional Status:   In your present state of health, do you have any difficulty performing the following activities: 11/04/2015 11/04/2015  Hearing? - N   Vision? - N  Difficulty concentrating or making decisions? - N  Walking or climbing stairs? - Y  Dressing or bathing? - N  Doing errands, shopping? N -  Preparing Food and eating ? - -  Using the Toilet? - -  In the past six months, have you accidently leaked urine? - -  Do you have problems with loss of bowel control? - -  Managing your Medications? - -  Managing your Finances? - -  Housekeeping or managing your Housekeeping? - -  Some recent data might be hidden    Fall/Depression Screening:    PHQ 2/9 Scores 10/11/2015 04/22/2015 03/07/2014 10/31/2013 10/07/2012  PHQ - 2 Score 0 0 0 0 0    Assessment:  Continued uncontrolled HTN                         AAA                         CHF                         DM  Plan: Called Dr. Martinique for further instructions on HTN control.           I will call pt in one week.           I will see pt in 2 week.  Jason Fisher [facility] [phone]

## 2015-12-12 MED ORDER — HYDRALAZINE HCL 50 MG PO TABS: 50.0000 mg | ORAL_TABLET | Freq: Three times a day (TID) | ORAL | 6 refills | Status: DC

## 2015-12-12 NOTE — Telephone Encounter (Signed)
Returned call to patient and Arbie Cookey with [facility]. Dr.Jordan advised to increase Hydralazine to 50 mg three times a day.Advised to recheck B/P in 2 weeks and call back to report.

## 2015-12-13 ENCOUNTER — Encounter: Payer: Self-pay | Admitting: Surgery

## 2015-12-16 ENCOUNTER — Ambulatory Visit (HOSPITAL_COMMUNITY)
Admission: RE | Admit: 2015-12-16 | Discharge: 2015-12-16 | Disposition: A | Discharge status: Home / Self Care | Payer: Medicare Other | Source: Ambulatory Visit | Attending: Vascular Surgery | Admitting: Vascular Surgery

## 2015-12-16 ENCOUNTER — Ambulatory Visit (INDEPENDENT_AMBULATORY_CARE_PROVIDER_SITE_OTHER)
Admission: RE | Admit: 2015-12-16 | Discharge: 2015-12-16 | Disposition: A | Discharge status: Home / Self Care | Payer: Medicare Other | Source: Ambulatory Visit | Attending: Vascular Surgery | Admitting: Vascular Surgery

## 2015-12-16 DIAGNOSIS — I739 Peripheral vascular disease, unspecified: Secondary | ICD-10-CM | POA: Insufficient documentation

## 2015-12-16 DIAGNOSIS — I6529 Occlusion and stenosis of unspecified carotid artery: Secondary | ICD-10-CM

## 2015-12-16 DIAGNOSIS — R0989 Other specified symptoms and signs involving the circulatory and respiratory systems: Secondary | ICD-10-CM | POA: Diagnosis present

## 2015-12-16 LAB — VAS US LOWER EXTREMITY ARTERIAL DUPLEX
LPOPPPSV: 51 cm/s
LSFDPSV: -90 cm/s
LSFMPSV: 90 cm/s
Left popliteal dist sys PSV: -49 cm/s
Left super femoral prox sys PSV: 92 cm/s
RIGHT ANT DIST TIBAL SYS PSV: 28 cm/s
RIGHT POST TIB DIST SYS: 28 cm/s
RPOPDPSV: -45 cm/s
RSFMPSV: -86 cm/s
RSFPPSV: 158 cm/s
Right popliteal prox sys PSV: 72 cm/s
Right super femoral dist sys PSV: -115 cm/s

## 2015-12-16 LAB — VAS US CAROTID
LEFT ECA DIAS: -10 cm/s
LEFT VERTEBRAL DIAS: 15 cm/s
Left CCA dist dias: -14 cm/s
Left CCA dist sys: -54 cm/s
Left CCA prox dias: 21 cm/s
Left CCA prox sys: 69 cm/s
Left ICA dist dias: -28 cm/s
Left ICA dist sys: -60 cm/s
Left ICA prox dias: -16 cm/s
Left ICA prox sys: -40 cm/s
RIGHT CCA MID DIAS: 9 cm/s
RIGHT ECA DIAS: -7 cm/s
RIGHT VERTEBRAL DIAS: 6 cm/s
Right CCA prox dias: 9 cm/s
Right CCA prox sys: 57 cm/s
Right cca dist sys: -46 cm/s

## 2015-12-17 ENCOUNTER — Ambulatory Visit (HOSPITAL_COMMUNITY)
Admission: RE | Admit: 2015-12-17 | Discharge: 2015-12-17 | Disposition: A | Discharge status: Home / Self Care | Payer: Medicare Other | Source: Ambulatory Visit | Attending: Surgery | Admitting: Surgery

## 2015-12-17 DIAGNOSIS — I714 Abdominal aortic aneurysm, without rupture, unspecified: Secondary | ICD-10-CM

## 2015-12-17 DIAGNOSIS — I6529 Occlusion and stenosis of unspecified carotid artery: Secondary | ICD-10-CM | POA: Diagnosis not present

## 2015-12-17 DIAGNOSIS — I739 Peripheral vascular disease, unspecified: Secondary | ICD-10-CM | POA: Diagnosis not present

## 2015-12-17 MED ORDER — IOPAMIDOL (ISOVUE-370) INJECTION 76%
100.0000 mL | Freq: Once | INTRAVENOUS | Status: AC | PRN
  Administered 2015-12-17: 80 mL via INTRAVENOUS

## 2015-12-18 ENCOUNTER — Encounter: Payer: Self-pay | Admitting: Surgery

## 2015-12-18 ENCOUNTER — Ambulatory Visit (INDEPENDENT_AMBULATORY_CARE_PROVIDER_SITE_OTHER): Payer: Medicare Other | Admitting: Surgery

## 2015-12-18 ENCOUNTER — Other Ambulatory Visit: Payer: Self-pay | Admitting: *Deleted

## 2015-12-18 VITALS — BP 144/80 | HR 88 | Temp 97.0°F | Resp 18 | Ht 68.0 in | Wt 197.0 lb

## 2015-12-18 DIAGNOSIS — I714 Abdominal aortic aneurysm, without rupture, unspecified: Secondary | ICD-10-CM

## 2015-12-18 NOTE — Patient Outreach (Signed)
Transition of care call to follow up on HTN control with increase in hydralazine to 50 mg tid. [name] reports slight improvement today on his blood pressure reading: 135/90. Previous readings going back on day at a time are 167/89, 149/94, 148/70, 170/98 and 175/122. He also had his CT of the abdomen today and his AAA measures 4.8 cm. The surgeon said he would wait another 6 months and scan again, no procedurew planned at this stage for this.  I will see pt next week for a home visit.  Jason Fisher [facility] [place] [phone]

## 2015-12-18 NOTE — Progress Notes (Signed)
[facility]ist of [facility]  Patient name: Jason Fisher MRN: [id number] DOB: 1941-01-17 Sex: male  REASON FOR VISIT: follow up  HPI: Jason Fisher is a 75 y.o. male returns today for follow-up of his abdominal aortic aneurysm.  The patient was admitted in August 0000000 with diastolic heart failure.  He underwent stenting of the second obtuse marginal.  He developed pneumonia after his intervention.  He has a history of STEMI in 2007 requiring stenting 2.  He had in-stent stenosis in 2009 which was treated with drug-eluting stent.  He is medically managed for hypertension with an ACE inhibitor.  He takes a statin for hypercholesterolemia.  The patient is a diabetic his most recent hemoglobin A1c was 6.6.  He is a former smoker.  He does have chronic renal insufficiency.  He has a history of atrial fibrillation for which he takes Eliquis.  He is also on Plavix.  The patient was in the hospital with rectal pain and found to have a perirectal abscess and thrombosed hemorrhoid.  This was treated operatively.  Past Medical History:  Diagnosis Date  . 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 BENIGN PROSTATIC HYPERTROPHY, HX OF 02/24/2007  . CAD S/P PCI    a. 2007 Inf STEMI s/p Vision BMS  2 to RCA;  b. 2009 ISR Prox RCA Stent-->with DES;  c. 09/2015 PCI: LM nl, LAD 30p/m, D1 90, LCX nl, OM1 small, 80, OM2 90 (2.5x14 Biofreedom stent), OM3 small, RCA 30p ISR, 11m ISR, 29m, 85d, EF 50-55%.  . 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 Chronic atrial fibrillation ([place]) 11/06/2009   a. On Eliquis (CHA2DS2VASc = 5).  Rate controlled w/ BB and CCB.  Marland Kitchen Chronic diastolic CHF (congestive heart failure) ([place])    a. 09/2015 Echo: EF 55-60%, mildly dil LA.  . CKD (chronic kidney disease), stage III   . COLONIC POLYPS, HX OF 11/02/2006  . DIABETES MELLITUS, TYPE II 10/29/2006  . Diverticulosis   . History of tobacco abuse    a. Quit 09/2015.  Marland Kitchen HYPERLIPIDEMIA  10/29/2006  . Hypertensive heart disease with heart failure (HCC)     Family History  Problem Relation Age of Onset  . Heart attack Mother   . Diabetes Mother   . Heart attack Father   . Hypertension Father   . Heart attack Brother   . Diabetes Brother     SOCIAL HISTORY: Social History  Substance Use Topics  . Smoking status: Former Smoker    Types: Cigarettes    Quit date: 09/08/2015  . Smokeless tobacco: Never Used     Comment: passive smoker as well  . Alcohol use No    Allergies  Allergen Reactions  . Contrast Media [Iodinated Diagnostic Agents] Anaphylaxis    Current Outpatient Prescriptions  Medication Sig Dispense Refill  . ACCU-CHEK AVIVA PLUS test strip CHECK BLOOD SUGAR ONCE DAILY AS NEEDED 100 each 4  . amoxicillin-clavulanate (AUGMENTIN) 875-125 MG tablet     . apixaban (ELIQUIS) 5 MG TABS tablet Take 1 tablet (5 mg total) by mouth 2 (two) times daily. 60 tablet 6  . atorvastatin (LIPITOR) 20 MG tablet TAKE 1 TABLET BY MOUTH EVERY DAY 90 tablet 1  . clopidogrel (PLAVIX) 75 MG tablet TAKE 1 TABLET BY MOUTH EVERY DAY 90 tablet 3  . diltiazem (CARDIZEM CD) 240 MG 24 hr capsule Take 1 capsule (240 mg total) by mouth daily. 30 capsule 1  . diphenhydrAMINE (BENADRYL) 25 mg capsule Take  50 mg by mouth 1 hour prior to your procedure. 2 capsule 0  . furosemide (LASIX) 40 MG tablet TAKE 1 TABLET (40 MG TOTAL) BY MOUTH DAILY. 30 tablet 1  . glimepiride (AMARYL) 4 MG tablet Take 0.5 tablets (2 mg total) by mouth daily with breakfast. 90 tablet 3  . hydrALAZINE (APRESOLINE) 50 MG tablet Take 1 tablet (50 mg total) by mouth 3 (three) times daily. 100 tablet 6  . Insulin Pen Needle 29G X 12.7MM MISC 1 each by Does not apply route 1 day or 1 dose. 100 each 3  . Lancets (ACCU-CHEK MULTICLIX) lancets Once daily 250.00 100 each 3  . Liraglutide (VICTOZA) 18 MG/3ML SOPN Inject 1.8 mg into the skin daily. Takes 0.22ml's    . metFORMIN (GLUCOPHAGE) 1000 MG tablet TAKE 1 TABLET TWICE  DAILY WITH A MEAL 180 tablet 1  . metoprolol (LOPRESSOR) 100 MG tablet Take 1 tablet (100 mg total) by mouth 2 (two) times daily. 180 tablet 1  . NITROSTAT 0.4 MG SL tablet PLACE 1 TABLET UNDER TONGUE EVERY 5 MINUTES AS NEEDED 25 tablet 0  . oxyCODONE (OXY IR/ROXICODONE) 5 MG immediate release tablet Take 1 tablet (5 mg total) by mouth every 6 (six) hours as needed for moderate pain or severe pain. 30 tablet 0  . polyethylene glycol powder (GLYCOLAX/MIRALAX) powder Take 17 g by mouth 2 (two) times daily as needed. (Patient taking differently: Take 17 g by mouth 2 (two) times daily as needed for mild constipation. ) 3350 g 1  . predniSONE (DELTASONE) 50 MG tablet One tablet (50mg ) 13 hours prior to procedure; one tablet (50mg ) 7 hours prior to procedure and then one tablet (50 mg) one hour prior to procedure. 3 tablet 0   No current facility-administered medications for this visit.     REVIEW OF SYSTEMS:  [X]  denotes positive finding, [ ]  denotes negative finding Cardiac  Comments:  Chest pain or chest pressure: x   Shortness of breath upon exertion:    Short of breath when lying flat:    Irregular heart rhythm:     x   Vascular    Pain in calf, thigh, or hip brought on by ambulation:    Pain in feet at night that wakes you up from your sleep:     Blood clot in your veins:    Leg swelling:         Pulmonary    Oxygen at home:    Productive cough:     Wheezing:         Neurologic    Sudden weakness in arms or legs:     Sudden numbness in arms or legs:     Sudden onset of difficulty speaking or slurred speech:    Temporary loss of vision in one eye:     Problems with dizziness:         Gastrointestinal    Blood in stool:  x   Vomited blood:         Genitourinary    Burning when urinating:     Blood in urine:        Psychiatric    Major depression:         Hematologic    Bleeding problems: x   Problems with blood clotting too easily:        Skin    Rashes or ulcers:          Constitutional    Fever or chills:  PHYSICAL EXAM: Vitals:   12/18/15 1038 12/18/15 1041  BP: (!) 145/89 (!) 144/80  Pulse: 88   Resp: 18   Temp: 97 F (36.1 C)   TempSrc: Oral   SpO2: 96%   Weight: 197 lb (89.4 kg)   Height: 5\' 8"  (1.727 m)     GENERAL: The patient is a well-nourished male, in no acute distress. The vital signs are documented above. CARDIAC: There is a regular rate and rhythm. PULMONARY: There is good air exchange MUSCULOSKELETAL: There are no major deformities or cyanosis. NEUROLOGIC: No focal weakness or paresthesias are detected. SKIN: There are no ulcers or rashes noted. PSYCHIATRIC: The patient has a normal affect.  DATA:  I have ordered and reviewed the following studies: Carotid duplex 1-39 percent bilateral stenosis ABI: 0.99 on the right 1.01 on the left, triphasic waveforms Duplex: Velocities suggest monophasic flow CT Angio: 1. Chest demonstrates essential resolution of previously noted irregular nodular opacities bilaterally. Findings are consistent with inflammatory/infectious etiology on the prior scan. There is no concern for malignancy in the chest. 2. No evidence of thoracic aortic aneurysmal disease. Previous 4 cm diameter measurement likely inaccurate due to axis of measurement. Maximal caliber of the ascending thoracic aorta is within normal limits and measured at 3.5 cm today. 3. Coronary atherosclerosis with previous stenting of the right coronary artery. 4. Enlargement of abdominal aortic aneurysm. There are actually two components of aneurysmal disease with mild dilatation of the mid abdominal aorta measuring 3.2 cm and situated just above the renal artery origins containing posterior ulcerated plaque. Larger saccular distal abdominal aortic aneurysm measures 4.8 cm and shows enlargement since 2009 at which time it measured 3.6 cm. 5. 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 The SMA and IMA show no evidence of occlusive disease. 6. 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 7. 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 8. Small bilateral inguinal hernias containing fat 9. Nonobstructing 3 mm right upper pole renal calculus.  MEDICAL ISSUES: AAA: Maximum diameter today based on CTA is 4.8 cm.  I discussed with the patient that I would not recommend repair until he reaches 5 cm.  He also needs careful attention to the suprarenal segment.  I suspect with his comorbidities it would be best to treat the infrarenal component and monitor the suprarenal part, however these decisions will be made based on future imaging studies.  The patient will follow-up with me in 6 months with a repeat CTA  Annamarie Major, MD [facility] [phone] Pager [phone]

## 2015-12-23 NOTE — Addendum Note (Signed)
Addended by: Mena Goes on: 12/23/2015 02:15 PM   Modules accepted: Orders

## 2015-12-25 ENCOUNTER — Other Ambulatory Visit: Payer: Self-pay | Admitting: *Deleted

## 2015-12-25 NOTE — Patient Outreach (Addendum)
[facility]) Care Management  12/25/2015  Jason Fisher 01-27-1941 [id number]   Routine home visit and follow up HTN:  BP 130/76 (BP Location: Right Arm, Patient Position: Sitting, Cuff Size: Normal)   Pulse 85   Resp 16   Wt 192 lb (87.1 kg) Comment: has lost 3# since drinking less tomatoe juice.  SpO2 97%   BMI 29.19 kg/m  FBS was 152.  Heart: Irregular and borderline tachycardic, AFIB. Lungs are clear. Pt Blood pressure readings first thing in the morning: 177/120           174/96           175/122            170/98             148/79                       149/94            167/89            135/90            164/122               187/108                                                                                         177/115            173/93            143/90             132/104            148/107             160/93   My reading: 130/76 taken at the same time.  Pt is currently on new medication hydralazine 50 mg tid.                                                     Also: Metroprolol tartrate 100 mg bid                                                              Furosemide 40 mg daily                                                              Cardizem CD 240 mg daily   I have checked his cuff on a previous visit and the readings were very close but today there is a significant difference 30 mm/Hg  systolic and 20 diastolic.  Plan is for pt to borrow a cuff from a family member and use that for the next week to see if we are dealing with a faulty cuff. I sincerely hope that is the case and my reading is right on.  I will call pt next week to see how his readings are and will see him again in 2 weeks.  **I wonder if we changed his metoprolol to the succinate version if this would assist in better control????  Deloria Lair [facility] [place] [phone]

## 2016-01-01 ENCOUNTER — Other Ambulatory Visit: Payer: Self-pay | Admitting: *Deleted

## 2016-01-01 NOTE — Patient Outreach (Signed)
Telephone assessment for HTN: Pt was not home and I left a message for him to return my call.  Pt returned my call and I was not available. I will call back tomorrow.  Deloria Lair [facility] [place] [phone]

## 2016-01-06 NOTE — Patient Outreach (Signed)
Telephone assessment. I have tried to call Jason Fisher several times this week and each time they are traveling in the car and connot give me the blood pressure readings they have been recording. I am do to see him at his home on Wedesday, so I will review their records then. He will also be seeing Jason Fisher this week.  Jason Fisher [facility] [place] [phone]

## 2016-01-08 ENCOUNTER — Other Ambulatory Visit: Payer: Self-pay | Admitting: *Deleted

## 2016-01-08 NOTE — Progress Notes (Signed)
Office Visit    Patient Name: Jason Fisher Date of Encounter: 01/10/2016  Primary Care Provider:  Nyoka Cowden, MD Primary Cardiologist:  P. Martinique, MD   Chief Complaint    75 year old male with prior history of CAD, chronic atrial fibrillation, stage III kidney disease, hypertension, hyperlipidemia, diabetes, and tobacco abuse who was  admitted in August with diastolic failure and underwent stenting of the second obtuse marginal with development of pneumonia post PCI. He presents for follow-up today.  Past Medical History    Past Medical History:  Diagnosis Date  . 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 BENIGN PROSTATIC HYPERTROPHY, HX OF 02/24/2007  . CAD S/P PCI    a. 2007 Inf STEMI s/p Vision BMS  2 to RCA;  b. 2009 ISR Prox RCA Stent-->with DES;  c. 09/2015 PCI: LM nl, LAD 30p/m, D1 90, LCX nl, OM1 small, 80, OM2 90 (2.5x14 Biofreedom stent), OM3 small, RCA 30p ISR, 6m ISR, 67m, 85d, EF 50-55%.  . 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 Chronic atrial fibrillation ([place]) 11/06/2009   a. On Eliquis (CHA2DS2VASc = 5).  Rate controlled w/ BB and CCB.  Marland Kitchen Chronic diastolic CHF (congestive heart failure) ([place])    a. 09/2015 Echo: EF 55-60%, mildly dil LA.  . CKD (chronic kidney disease), stage III   . COLONIC POLYPS, HX OF 11/02/2006  . DIABETES MELLITUS, TYPE II 10/29/2006  . Diverticulosis   . History of tobacco abuse    a. Quit 09/2015.  Marland Kitchen HYPERLIPIDEMIA 10/29/2006  . Hypertensive heart disease with heart failure [facility])    Past Surgical History:  Procedure Laterality Date  . CARDIAC CATHETERIZATION N/A 09/26/2015   Procedure: Left Heart Cath and Coronary Angiography;  Surgeon: Savanna Dooley M Martinique, MD;  Location: [place] CV LAB;  Service: Cardiovascular;  Laterality: N/A;  . CARDIAC CATHETERIZATION N/A 09/26/2015   Procedure: Coronary Stent Intervention;  Surgeon: Ebba Goll M Martinique, MD;  Location: [place] CV LAB;  Service: Cardiovascular;  Laterality: N/A;  .  CATARACT EXTRACTION    . COLONOSCOPY    . CORONARY ANGIOPLASTY WITH STENT PLACEMENT  2007   Inferior STEMI 2007 - RCA PCI with 2 Vision BMS; Abnormal Myoview in 2009 - pRCA ins-stent restenosis & unstented disease - DES PCI Promus 3.0 x 20 mm (3.5 mm)  . INCISION AND DRAINAGE PERIRECTAL ABSCESS N/A 11/06/2015   Procedure: IRRIGATION AND DEBRIDEMENT PERIRECTAL ABSCESS, POSSIBLE HEMORRHOIDECTOMY;  Surgeon: Coralie Keens, MD;  Location: [place];  Service: General;  Laterality: N/A;  . SHOULDER SURGERY Right     Allergies  Allergies  Allergen Reactions  . Contrast Media [Iodinated Diagnostic Agents] Anaphylaxis    History of Present Illness    75 year old male with past medical history including coronary artery disease status post inferior STEMI in 2007 requiring bare metal stenting 2 with subsequent drug-eluting stent placement secondary to in-stent restenosis in 2009. He also has a history of hypertension, hyperlipidemia, diabetes, stage III kidney disease, and tobacco abuse. He was  admitted to [facility] in August with dyspnea and chest pain. He was volume overloaded and diuresed. He had only mild troponin elevation. Subsequently underwent diagnostic catheterization revealing severe disease in the second obtuse marginal with otherwise moderate disease throughout. He had only minimal in-stent restenosis with prior RCA stents. LV function was normal. The obtuse marginal successfully stented with a bio freedom stent since he is on chronic eliquis. Post catheterization, he developed PNA. Blood cultures are positive but this was felt  to be secondary to contamination.   He was readmitted in October with a perirectal abscess. This was surgically drained. He is now followed by Dr. Lamonte Sakai in pulmonary for abnormal CT findings. PFTs scheduled. He is followed by Dr. Trula Slade for aortic aneurysmal diseaes.   On follow up today he reports he gets SOB and tires easily. He is sedentary- just walks around his  home. No chest pain. Denies palpitations, weight gain, dizziness, or edema. No cough.    Home Medications     Medication List       Accurate as of 01/10/16 12:32 PM. Always use your most recent med list.          ACCU-CHEK AVIVA PLUS test strip Generic drug:  glucose blood CHECK BLOOD SUGAR ONCE DAILY AS NEEDED   accu-chek multiclix lancets Once daily 250.00   apixaban 5 MG Tabs tablet Commonly known as:  ELIQUIS Take 1 tablet (5 mg total) by mouth 2 (two) times daily.   atorvastatin 20 MG tablet Commonly known as:  LIPITOR TAKE 1 TABLET BY MOUTH EVERY DAY   clopidogrel 75 MG tablet Commonly known as:  PLAVIX TAKE 1 TABLET BY MOUTH EVERY DAY   furosemide 40 MG tablet Commonly known as:  LASIX TAKE 1 TABLET (40 MG TOTAL) BY MOUTH DAILY.   glimepiride 4 MG tablet Commonly known as:  AMARYL Take 0.5 tablets (2 mg total) by mouth daily with breakfast.   hydrALAZINE 50 MG tablet Commonly known as:  APRESOLINE Take 1 tablet (50 mg total) by mouth 3 (three) times daily.   Insulin Pen Needle 29G X 12.7MM Misc 1 each by Does not apply route 1 day or 1 dose.   metFORMIN 1000 MG tablet Commonly known as:  GLUCOPHAGE TAKE 1 TABLET TWICE DAILY WITH A MEAL   metoprolol 100 MG tablet Commonly known as:  LOPRESSOR Take 1 tablet (100 mg total) by mouth 2 (two) times daily.   NITROSTAT 0.4 MG SL tablet Generic drug:  nitroGLYCERIN PLACE 1 TABLET UNDER TONGUE EVERY 5 MINUTES AS NEEDED   oxyCODONE 5 MG immediate release tablet Commonly known as:  Oxy IR/ROXICODONE Take 1 tablet (5 mg total) by mouth every 6 (six) hours as needed for moderate pain or severe pain.   polyethylene glycol powder powder Commonly known as:  GLYCOLAX/MIRALAX Take 17 g by mouth 2 (two) times daily as needed.   VICTOZA 18 MG/3ML Sopn Generic drug:  liraglutide Inject 1.8 mg into the skin daily. Takes 0.85ml's        Review of Systems    As above, he has been doing well without chest pain  or dyspnea. He further denies PND, orthopnea, dizziness, syncope, palpitations, edema, or early satiety. All other systems reviewed and are otherwise negative except as noted above.  Physical Exam    VS:  BP 130/82 (BP Location: Right Arm, Patient Position: Sitting, Cuff Size: Normal)   Pulse 80   Ht 5\' 8"  (1.727 m)   Wt 196 lb 9.6 oz (89.2 kg)   SpO2 96%   BMI 29.89 kg/m  , BMI Body mass index is 29.89 kg/m. Repeat blood pressure 118/80.  GEN: Well nourished, well developed, in no acute distress.  HEENT: normal.  Neck: Supple, no JVD, carotid bruits, or masses. Cardiac: IRRR, no murmurs, rubs, or gallops. No clubbing, cyanosis, edema.  Radials/DP/PT 2+ and equal bilaterally.  Respiratory:  Respirations regular and unlabored. Diminished breath sounds bilaterally. GI: Soft, nontender, nondistended, BS + x 4. MS: no  deformity or atrophy. Skin: warm and dry, no rash. Neuro:  Strength and sensation are intact. Psych: Normal affect.  Accessory Clinical Findings     Lab Results  Component Value Date   WBC 10.2 11/08/2015   HGB 11.5 (L) 11/08/2015   HCT 36.8 (L) 11/08/2015   PLT 211 11/08/2015   GLUCOSE 187 (H) 11/08/2015   CHOL 137 09/25/2015   TRIG 64 09/25/2015   HDL 47 09/25/2015   LDLCALC 77 09/25/2015   ALT 20 11/04/2015   AST 41 11/04/2015   NA 142 11/08/2015   K 3.4 (L) 11/08/2015   CL 105 11/08/2015   CREATININE 1.38 (H) 11/08/2015   BUN 17 11/08/2015   CO2 28 11/08/2015   TSH 2.47 08/30/2015   PSA 1.22 08/30/2015   INR 1.22 09/25/2015   HGBA1C 6.6 (H) 11/04/2015   MICROALBUR 3.7 (H) 08/30/2015     Assessment & Plan    1.  Coronary artery disease: Status post prior RCA stenting in 2007 with repeat stenting in 2009 and more recently stenting of the second obtuse marginal in late August. Denies chest pain. He has quit smoking. He is tolerating his medications including statin, Plavix, beta blocker, and ACE inhibitor therapy.  He will remain on eliquis and  Plavix. I have encouraged him to go ahead and enroll in cardiac rehabilitation.  2. Chronic atrial fibrillation: This is well rate controlled on  metoprolol. He is anticoagulated with eliquis.  3. Hypertensive heart disease with chronic diastolic CHF: The pressure is well controlled today. His volume status looks good.   4. Hyperlipidemia:  He is on Lipitor 20 chronically.  5. Tobacco abuse: He has not smoked since this summer.  6. Stage III chronic kidney disease  7. DM II:   managed by primary care.   8.  Dyspnea. Multifactorial with diastolic CHF, afib, COPD, and deconditioning. Cardiac status is stable now. Await PFTs. Enroll in Cardiac Rehab and work on conditioning.   Macio Kissoon Martinique, MD,FACC 01/10/2016, 12:31 PM

## 2016-01-10 ENCOUNTER — Encounter: Payer: Self-pay | Admitting: Cardiology

## 2016-01-10 ENCOUNTER — Ambulatory Visit (INDEPENDENT_AMBULATORY_CARE_PROVIDER_SITE_OTHER): Payer: Medicare Other | Admitting: Cardiology

## 2016-01-10 VITALS — BP 130/82 | HR 80 | Ht 68.0 in | Wt 196.6 lb

## 2016-01-10 DIAGNOSIS — I482 Chronic atrial fibrillation, unspecified: Secondary | ICD-10-CM

## 2016-01-10 DIAGNOSIS — Z9861 Coronary angioplasty status: Secondary | ICD-10-CM | POA: Diagnosis not present

## 2016-01-10 DIAGNOSIS — I5032 Chronic diastolic (congestive) heart failure: Secondary | ICD-10-CM | POA: Diagnosis not present

## 2016-01-10 DIAGNOSIS — I2510 Atherosclerotic heart disease of native coronary artery without angina pectoris: Secondary | ICD-10-CM

## 2016-01-10 NOTE — Patient Outreach (Signed)
[facility]) Care Management  01/10/2016  Jason Fisher [birth year] [id number]  Routine home visit and follow up for uncontrolled HTN.  Pt BP values are still not near goal. Systolic 630-160 and Diastolic 10-932. He is on hydralazine 50 mg tid, in addition to metoprolol 100 mg bid and cardizem CD 240 mg daily.  Glucose levels are normal fasting, 71 this am. He is doing better on his carbohydrate intake.  Weight is down a bit to 194, since he is not drinking so much tomato juice.  O:  BP (!) 150/100 (BP Location: Right Arm, Patient Position: Sitting, Cuff Size: Normal)   Pulse 90 Comment: AFIB  Resp 18   Wt 194 lb (88 kg)   SpO2 96%   BMI 29.50 kg/m   AFIB Lungs are clear No peripheral edema  A: HTN not at goal     AAA     CHF     DM  P:  Pt to see Dr. Martinique Friday, hopefully he will add another antihypertensive to get his pressure down further.       I will call pt on 01/23/16 for follow up.  [facility] CM Care Plan Problem One   Flowsheet Row Most Recent Value  Care Plan Problem One  (P) New diagnosis of CHF.  Role Documenting the Problem One  (P) Care Management [place] for Problem One  (P) Active  THN Long Term Goal (31-90 days)  (P) Pt will not readmit due to HF over the next 31 days.  THN Long Term Goal Start Date  (P) 10/11/15  THN Long Term Goal Met Date  (P) 11/27/15  Interventions for Problem One Long Term Goal  (P) Reinforced following CHF action plan for the rest of his life.  THN CM Short Term Goal #1 (0-30 days)  (P) Pt will verbalize knowledge of disease process, pathophysiology at the end of 30 days.  THN CM Short Term Goal #1 Start Date  (P) 10/11/15  THN CM Short Term Goal #1 Met Date  (P) 11/27/15  Interventions for Short Term Goal #1  (P) Pt will need constant reinforcement and explanation about why he should do these actions.  THN CM Short Term Goal #2 (0-30 days)  (P) Pt will be able to discuss which meds he takes for HF and  what they do at the end of 30 days.  THN CM Short Term Goal #2 Start Date  (P) 10/11/15  THN CM Short Term Goal #2 Met Date  (P) 11/27/15  Interventions for Short Term Goal #2  (P) Reviewed medications.  THN CM Short Term Goal #3 (0-30 days)  (P) Pt will be educated about low salt diet and things to avoid and will be able to discuss at the end of 30 days.  THN CM Short Term Goal #3 Start Date  (P) 11/13/15  Interventions for Short Tern Goal #3  (P) Celebrated his success.!    [facility] CM Care Plan Problem Two   Flowsheet Row Most Recent Value  Care Plan Problem Two  (P) BP not at goal.  Care Plan for Problem Two  (P) Active    THN CM Care Plan Problem Three   Flowsheet Row Most Recent Value  Care Plan Problem Three  (P) Pt to check his blood pressure daily and record.  Role Documenting the Problem Three  (P) Care Management [place] for Problem Three  (P) Active  THN CM Short Term Goal #  1 (0-30 days)  (P) Pt to check his blood pressure daily and record over the next month.  THN CM Short Term Goal #1 Start Date  (P) 12/25/15     Deloria Lair [facility] [place] [phone]

## 2016-01-10 NOTE — Patient Instructions (Addendum)
Continue your current therapy  You need to start walking daily  I will see you in 4 months

## 2016-01-15 ENCOUNTER — Ambulatory Visit (INDEPENDENT_AMBULATORY_CARE_PROVIDER_SITE_OTHER)
Admission: RE | Admit: 2016-01-15 | Discharge: 2016-01-15 | Disposition: A | Discharge status: Home / Self Care | Payer: Medicare Other | Source: Ambulatory Visit | Attending: Emergency Medicine | Admitting: Emergency Medicine

## 2016-01-15 DIAGNOSIS — I2699 Other pulmonary embolism without acute cor pulmonale: Secondary | ICD-10-CM | POA: Diagnosis not present

## 2016-01-15 DIAGNOSIS — R911 Solitary pulmonary nodule: Secondary | ICD-10-CM | POA: Diagnosis not present

## 2016-01-22 ENCOUNTER — Other Ambulatory Visit: Payer: Self-pay | Admitting: Cardiology

## 2016-01-22 ENCOUNTER — Ambulatory Visit (INDEPENDENT_AMBULATORY_CARE_PROVIDER_SITE_OTHER): Payer: Medicare Other | Admitting: Emergency Medicine

## 2016-01-22 ENCOUNTER — Telehealth: Payer: Self-pay | Admitting: Internal Medicine

## 2016-01-22 DIAGNOSIS — Z720 Tobacco use: Secondary | ICD-10-CM

## 2016-01-22 LAB — PULMONARY FUNCTION TEST
DL/VA % pred: 78 %
DL/VA: 3.54 ml/min/mmHg/L
DLCO cor % pred: 54 %
DLCO cor: 16.79 ml/min/mmHg
DLCO unc % pred: 50 %
DLCO unc: 15.65 ml/min/mmHg
FEF 25-75 Post: 0.85 L/s
FEF 25-75 Pre: 0.45 L/s
FEF2575-%Change-Post: 87 %
FEF2575-%Pred-Post: 39 %
FEF2575-%Pred-Pre: 21 %
FEV1-%Change-Post: 29 %
FEV1-%Pred-Post: 48 %
FEV1-%Pred-Pre: 37 %
FEV1-Post: 1.43 L
FEV1-Pre: 1.11 L
FEV1FVC-%Change-Post: 15 %
FEV1FVC-%Pred-Pre: 69 %
FEV6-%Change-Post: 10 %
FEV6-%Pred-Post: 62 %
FEV6-%Pred-Pre: 56 %
FEV6-Post: 2.37 L
FEV6-Pre: 2.14 L
FEV6FVC-%Change-Post: -1 %
FEV6FVC-%Pred-Post: 102 %
FEV6FVC-%Pred-Pre: 104 %
FVC-%Change-Post: 12 %
FVC-%Pred-Post: 60 %
FVC-%Pred-Pre: 53 %
FVC-Post: 2.46 L
FVC-Pre: 2.19 L
Post FEV1/FVC ratio: 58 %
Post FEV6/FVC ratio: 97 %
Pre FEV1/FVC ratio: 51 %
Pre FEV6/FVC Ratio: 98 %
RV % pred: 146 %
RV: 3.67 L
TLC % pred: 87 %
TLC: 5.98 L

## 2016-01-22 NOTE — Telephone Encounter (Signed)
Pt states dr Raliegh Ip increased his apixaban (ELIQUIS) 5 MG TABS tablet and Liraglutide (VICTOZA) 18 MG/3ML SOPN  Pt states the new rx Eliquis cost him double now.  Pt would like to know we can provide samples of the Liraglutide (VICTOZA) 18 MG/3ML SOPN  So the cost will even out.

## 2016-01-22 NOTE — Telephone Encounter (Signed)
Spoke with pt and informed him that the office doesn't have any samples. Writer looked for coupons for [employer] and Eliquis but we are all out. Pt verbalized understanding.

## 2016-01-23 ENCOUNTER — Other Ambulatory Visit: Payer: Self-pay | Admitting: *Deleted

## 2016-01-23 NOTE — Patient Outreach (Signed)
Telephone Assessment: I spoke with pt's wife today. She reports pt blood pressure range has been: [id number] systollically and [id number] diastolically. His glucose levels are running a little higher occasionally: range has been 90-273. His weight is stable around 190. He had a PFT yesterday.   I will see him on January 11th at 10:00 am. I have reminded her to call me if ANY problems arise!  Deloria Lair [facility] [place] [phone]

## 2016-01-23 NOTE — Patient Outreach (Signed)
Telephone assessment. Pt did not answer his phone this am. I left a message and asked for a return call.  Deloria Lair [facility] [place] [phone]

## 2016-02-05 ENCOUNTER — Encounter: Payer: Self-pay | Admitting: Internal Medicine

## 2016-02-05 ENCOUNTER — Other Ambulatory Visit: Payer: Self-pay | Admitting: Cardiology

## 2016-02-05 ENCOUNTER — Ambulatory Visit (INDEPENDENT_AMBULATORY_CARE_PROVIDER_SITE_OTHER): Payer: Medicare Other | Admitting: Internal Medicine

## 2016-02-05 DIAGNOSIS — I10 Essential (primary) hypertension: Secondary | ICD-10-CM | POA: Diagnosis not present

## 2016-02-05 DIAGNOSIS — I482 Chronic atrial fibrillation, unspecified: Secondary | ICD-10-CM

## 2016-02-05 DIAGNOSIS — E1151 Type 2 diabetes mellitus with diabetic peripheral angiopathy without gangrene: Secondary | ICD-10-CM

## 2016-02-05 DIAGNOSIS — Z23 Encounter for immunization: Secondary | ICD-10-CM

## 2016-02-05 DIAGNOSIS — I5043 Acute on chronic combined systolic (congestive) and diastolic (congestive) heart failure: Secondary | ICD-10-CM

## 2016-02-05 DIAGNOSIS — N183 Chronic kidney disease, stage 3 unspecified: Secondary | ICD-10-CM

## 2016-02-05 LAB — CBC WITH DIFFERENTIAL/PLATELET
BASOS ABS: 0 10*3/uL (ref 0.0–0.1)
Basophils Relative: 0.6 % (ref 0.0–3.0)
Eosinophils Absolute: 0.1 10*3/uL (ref 0.0–0.7)
Eosinophils Relative: 1.6 % (ref 0.0–5.0)
HCT: 42.1 % (ref 39.0–52.0)
Hemoglobin: 13.9 g/dL (ref 13.0–17.0)
LYMPHS ABS: 1.6 10*3/uL (ref 0.7–4.0)
Lymphocytes Relative: 20.1 % (ref 12.0–46.0)
MCHC: 33.1 g/dL (ref 30.0–36.0)
MCV: 81.4 fl (ref 78.0–100.0)
MONOS PCT: 8 % (ref 3.0–12.0)
Monocytes Absolute: 0.7 10*3/uL (ref 0.1–1.0)
NEUTROS ABS: 5.7 10*3/uL (ref 1.4–7.7)
NEUTROS PCT: 69.7 % (ref 43.0–77.0)
PLATELETS: 201 10*3/uL (ref 150.0–400.0)
RBC: 5.17 Mil/uL (ref 4.22–5.81)
RDW: 15.7 % — AB (ref 11.5–15.5)
WBC: 8.2 10*3/uL (ref 4.0–10.5)

## 2016-02-05 LAB — COMPREHENSIVE METABOLIC PANEL
ALT: 14 U/L (ref 0–53)
AST: 16 U/L (ref 0–37)
Albumin: 4.2 g/dL (ref 3.5–5.2)
Alkaline Phosphatase: 84 U/L (ref 39–117)
BUN: 15 mg/dL (ref 6–23)
CO2: 30 mEq/L (ref 19–32)
Calcium: 9.7 mg/dL (ref 8.4–10.5)
Chloride: 103 mEq/L (ref 96–112)
Creatinine, Ser: 1.24 mg/dL (ref 0.40–1.50)
GFR: 60.37 mL/min (ref >60.00)
GLUCOSE: 296 mg/dL — AB (ref 70–99)
POTASSIUM: 3.8 meq/L (ref 3.5–5.1)
SODIUM: 144 meq/L (ref 135–145)
Total Bilirubin: 0.9 mg/dL (ref 0.2–1.2)
Total Protein: 6.8 g/dL (ref 6.0–8.3)

## 2016-02-05 LAB — BRAIN NATRIURETIC PEPTIDE: Pro B Natriuretic peptide (BNP): 289 pg/mL — ABNORMAL HIGH (ref 0.0–100.0)

## 2016-02-05 LAB — HEMOGLOBIN A1C: Hgb A1c MFr Bld: 7 % — ABNORMAL HIGH (ref 4.6–6.5)

## 2016-02-05 NOTE — Progress Notes (Signed)
Subjective:    Patient ID: Jason Fisher, male    DOB: [birth year], 76 y.o.   MRN: [id number]  HPI  Wt Readings from Last 3 Encounters:  02/05/16 201 lb 9.6 oz (91.4 kg)  01/10/16 196 lb 9.6 oz (89.2 kg)  01/10/16 194 lb (88 kg)   Lab Results  Component Value Date   HGBA1C 6.6 (H) 11/04/2015   76 year old patient who presents with a two-week history of increasing exertional fatigue and dyspnea.  This morning he developed a mild cough, but this has not been prominent over the past week.  Over the past month, there is been a 7 pound weight increase.  No peripheral edema  He does have a history of diastolic heart failure as well as paroxysmal atrial fibrillation.  He has had a recent chest CT about 2 weeks ago for follow-up of a pulmonary nodule.  This revealed some emphysematous changes as well as increasing atelectatic changes. He is scheduled for pulmonary follow-up in 2 days.  He was seen by cardiology  3 weeks ago and also complained of some dyspnea at that time.  This was felt to be multi-factorial with diastolic heart failure, COPD, chronic atrial fibrillation, and deconditioning all contributing.  His cardiac status was felt to be stable.  Chest examination revealed diminished breath sounds but otherwise clear  Primary function studies were performed on December 20 that  revealed a significant obstructive defect with reversibility.  Diffusion capacity was significantly impaired.  Official report pending  He discontinued tobacco in August 2017  Past Medical History:  Diagnosis Date  . 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 BENIGN PROSTATIC HYPERTROPHY, HX OF 02/24/2007  . CAD S/P PCI    a. 2007 Inf STEMI s/p Vision BMS  2 to RCA;  b. 2009 ISR Prox RCA Stent-->with DES;  c. 09/2015 PCI: LM nl, LAD 30p/m, D1 90, LCX nl, OM1 small, 80, OM2 90 (2.5x14 Biofreedom stent), OM3 small, RCA 30p ISR, 74m ISR, 76m, 85d, EF 50-55%.  . 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 Chronic atrial  fibrillation ([place]) 11/06/2009   a. On Eliquis (CHA2DS2VASc = 5).  Rate controlled w/ BB and CCB.  Marland Kitchen Chronic diastolic CHF (congestive heart failure) ([place])    a. 09/2015 Echo: EF 55-60%, mildly dil LA.  . CKD (chronic kidney disease), stage III   . COLONIC POLYPS, HX OF 11/02/2006  . DIABETES MELLITUS, TYPE II 10/29/2006  . Diverticulosis   . History of tobacco abuse    a. Quit 09/2015.  Marland Kitchen HYPERLIPIDEMIA 10/29/2006  . Hypertensive heart disease with heart failure [facility])      Social History   Social History  . Marital status: Married    Spouse name: N/A  . Number of children: 5  . Years of education: N/A   Occupational History  . [employer]    Social History Main Topics  . Smoking status: Former Smoker    Types: Cigarettes    Quit date: 09/08/2015  . Smokeless tobacco: Never Used     Comment: passive smoker as well  . Alcohol use No  . Drug use: No  . Sexual activity: Not on file   Other Topics Concern  . Not on file   Social History Narrative  . No narrative on file    Past Surgical History:  Procedure Laterality Date  . CARDIAC CATHETERIZATION N/A 09/26/2015   Procedure: Left Heart Cath and Coronary Angiography;  Surgeon: Peter M Martinique, MD;  Location: [facility]  INVASIVE CV LAB;  Service: Cardiovascular;  Laterality: N/A;  . CARDIAC CATHETERIZATION N/A 09/26/2015   Procedure: Coronary Stent Intervention;  Surgeon: Peter M Martinique, MD;  Location: [place] CV LAB;  Service: Cardiovascular;  Laterality: N/A;  . CATARACT EXTRACTION    . COLONOSCOPY    . CORONARY ANGIOPLASTY WITH STENT PLACEMENT  2007   Inferior STEMI 2007 - RCA PCI with 2 Vision BMS; Abnormal Myoview in 2009 - pRCA ins-stent restenosis & unstented disease - DES PCI Promus 3.0 x 20 mm (3.5 mm)  . INCISION AND DRAINAGE PERIRECTAL ABSCESS N/A 11/06/2015   Procedure: IRRIGATION AND DEBRIDEMENT PERIRECTAL ABSCESS, POSSIBLE HEMORRHOIDECTOMY;  Surgeon: Coralie Keens, MD;  Location: [place];  Service: General;   Laterality: N/A;  . SHOULDER SURGERY Right     Family History  Problem Relation Age of Onset  . Heart attack Mother   . Diabetes Mother   . Heart attack Father   . Hypertension Father   . Heart attack Brother   . Diabetes Brother     Allergies  Allergen Reactions  . Contrast Media [Iodinated Diagnostic Agents] Anaphylaxis    Current Outpatient Prescriptions on File Prior to Visit  Medication Sig Dispense Refill  . ACCU-CHEK AVIVA PLUS test strip CHECK BLOOD SUGAR ONCE DAILY AS NEEDED 100 each 4  . apixaban (ELIQUIS) 5 MG TABS tablet Take 1 tablet (5 mg total) by mouth 2 (two) times daily. 60 tablet 6  . atorvastatin (LIPITOR) 20 MG tablet TAKE 1 TABLET BY MOUTH EVERY DAY 90 tablet 1  . clopidogrel (PLAVIX) 75 MG tablet TAKE 1 TABLET BY MOUTH EVERY DAY 90 tablet 3  . furosemide (LASIX) 40 MG tablet TAKE 1 TABLET (40 MG TOTAL) BY MOUTH DAILY. 30 tablet 1  . glimepiride (AMARYL) 4 MG tablet Take 0.5 tablets (2 mg total) by mouth daily with breakfast. 90 tablet 3  . hydrALAZINE (APRESOLINE) 50 MG tablet Take 1 tablet (50 mg total) by mouth 3 (three) times daily. 100 tablet 6  . Insulin Pen Needle 29G X 12.7MM MISC 1 each by Does not apply route 1 day or 1 dose. 100 each 3  . Lancets (ACCU-CHEK MULTICLIX) lancets Once daily 250.00 100 each 3  . Liraglutide (VICTOZA) 18 MG/3ML SOPN Inject 1.8 mg into the skin daily. Takes 0.40ml's    . metFORMIN (GLUCOPHAGE) 1000 MG tablet TAKE 1 TABLET TWICE DAILY WITH A MEAL 180 tablet 1  . metoprolol (LOPRESSOR) 100 MG tablet Take 1 tablet (100 mg total) by mouth 2 (two) times daily. 180 tablet 1  . NITROSTAT 0.4 MG SL tablet PLACE 1 TABLET UNDER TONGUE EVERY 5 MINUTES AS NEEDED 25 tablet 0  . oxyCODONE (OXY IR/ROXICODONE) 5 MG immediate release tablet Take 1 tablet (5 mg total) by mouth every 6 (six) hours as needed for moderate pain or severe pain. 30 tablet 0  . polyethylene glycol powder (GLYCOLAX/MIRALAX) powder Take 17 g by mouth 2 (two) times  daily as needed. (Patient taking differently: Take 17 g by mouth 2 (two) times daily as needed for mild constipation. ) 3350 g 1   No current facility-administered medications on file prior to visit.     BP (!) 146/72 (BP Location: Right Arm, Patient Position: Sitting, Cuff Size: Large)   Pulse (!) 112   Temp 97.6 F (36.4 C) (Oral)   Ht 5\' 8"  (1.727 m)   Wt 201 lb 9.6 oz (91.4 kg)   SpO2 91%   BMI 30.65 kg/m  Review of Systems  Constitutional: Positive for fatigue. Negative for appetite change, chills and fever.  HENT: Negative for congestion, dental problem, ear pain, hearing loss, sore throat, tinnitus, trouble swallowing and voice change.   Eyes: Negative for pain, discharge and visual disturbance.  Respiratory: Positive for cough and shortness of breath. Negative for chest tightness, wheezing and stridor.   Cardiovascular: Negative for chest pain, palpitations and leg swelling.  Gastrointestinal: Negative for abdominal distention, abdominal pain, blood in stool, constipation, diarrhea, nausea and vomiting.  Genitourinary: Negative for difficulty urinating, discharge, flank pain, genital sores, hematuria and urgency.  Musculoskeletal: Negative for arthralgias, back pain, gait problem, joint swelling, myalgias and neck stiffness.  Skin: Negative for rash.  Neurological: Positive for weakness. Negative for dizziness, syncope, speech difficulty, numbness and headaches.  Hematological: Negative for adenopathy. Does not bruise/bleed easily.  Psychiatric/Behavioral: Negative for behavioral problems and dysphoric mood. The patient is not nervous/anxious.        Objective:   Physical Exam  Constitutional: He is oriented to person, place, and time. He appears well-developed.  Blood pressure 160/84 O2 saturation 94%  HENT:  Head: Normocephalic.  Right Ear: External ear normal.  Left Ear: External ear normal.  Eyes: Conjunctivae and EOM are normal.  Neck: Normal range of  motion.  Cardiovascular: Normal heart sounds.   Irregular rhythm with a rate of 88 Initial heart rate, after ambulation to the examining room 112  Pulmonary/Chest: He has rales.  O2 sat ration 94% Rales involving the lower 1 third lung fields  Abdominal: Bowel sounds are normal.  Musculoskeletal: Normal range of motion. He exhibits no edema or tenderness.  Neurological: He is alert and oriented to person, place, and time.  Psychiatric: He has a normal mood and affect. His behavior is normal.          Assessment & Plan:   Worsening dyspnea on exertion/weakness.  Multi-factorial.  Patient sounds fluid overloaded.  Will increase furosemide to a twice a day regimen for the next few days.  Pulmonary follow-up as scheduled in 2 days and we'll reassess at that time COPD Chronic atrial fibrillation Obesity/deconditioning Hypertension  Pulmonary in cardiology follow-up as scheduled Follow-up here one month  We'll check lab including BNP  Nyoka Cowden

## 2016-02-05 NOTE — Patient Instructions (Signed)
Pulmonary follow-up in 2 days as scheduled  Increase furosemide to twice daily.  Take 1 tablet in the morning and a second tablet in the early afternoon  Limit your sodium (Salt) intake  Daily weights.  Report any weight gain in excess of 3 pounds  Cardiology follow-up as scheduled

## 2016-02-05 NOTE — Progress Notes (Signed)
Pre visit review using our clinic review tool, if applicable. No additional management support is needed unless otherwise documented below in the visit note. 

## 2016-02-05 NOTE — Telephone Encounter (Signed)
REFILL 

## 2016-02-06 ENCOUNTER — Ambulatory Visit: Payer: Medicare Other | Admitting: Emergency Medicine

## 2016-02-07 ENCOUNTER — Encounter: Payer: Self-pay | Admitting: Emergency Medicine

## 2016-02-07 ENCOUNTER — Ambulatory Visit (INDEPENDENT_AMBULATORY_CARE_PROVIDER_SITE_OTHER): Payer: Medicare Other | Admitting: Emergency Medicine

## 2016-02-07 DIAGNOSIS — J441 Chronic obstructive pulmonary disease with (acute) exacerbation: Secondary | ICD-10-CM | POA: Insufficient documentation

## 2016-02-07 DIAGNOSIS — R918 Other nonspecific abnormal finding of lung field: Secondary | ICD-10-CM | POA: Diagnosis not present

## 2016-02-07 DIAGNOSIS — J449 Chronic obstructive pulmonary disease, unspecified: Secondary | ICD-10-CM | POA: Diagnosis not present

## 2016-02-07 NOTE — Assessment & Plan Note (Signed)
Largely resolved on his most recent CT scan of the chest. This is inconsistent with malignancy. I do not believe we need to perform scheduled follow up based on the iimprovement/.

## 2016-02-07 NOTE — Patient Instructions (Addendum)
Your CT scan of the chest shows that the concerning nodules from earlier scans have resolved.  We will try Stiolto 2 puffs once a day for 3-4 weeks.  Follow with APP in 1 month to assess benefit of the new medication.  Follow with Dr Lamonte Sakai in 6 months or sooner if you have any problems

## 2016-02-07 NOTE — Assessment & Plan Note (Signed)
Very severe obstruction noted on his spirometry. He does have daily symptoms. Likely GOLD C. Stiolto to see if he benefits. Walking oximetry today. Follow in 1 month here to assess benefit of BD

## 2016-02-07 NOTE — Progress Notes (Signed)
Subjective:    Patient ID: Jason Fisher, male    DOB: [birth year], 76 y.o.   MRN: [id number]  HPI 76 year old former smoker (50 pack-yrs) with hx CAD/PTCI, carotid artery disease, chronic atrial fibrillation on anticoagulation, HTN with dCHF. Also history AAA, 5.2 cm. He was Admitted to the hospital in early October 2017 for a perirectal abscess which was treated by I&D and then IV abx. As part of that evaluation a CT scan of his abdomen and pelvis CT scan of his chest revealed bilateral irregular scattered very nodular lesions. The largest is 1.5 x 1.4 cm in the lingula. Also noted was some mild mediastinal lymphadenopathy. The nodules were felt to be potentially consistent with either an infectious process, noninfectious inflammatory process or malignancy. He presents now for further follow up. He finished his abx last week. He denies any resp sx while he was in the hospital. He is to have CT angio soon to better eval his AAA.   He notes exertional fatigue, no real dyspnea w normal walking. He does get dyspnea w stairs. He is not having much cough. Occasional mucous, no hemoptysis. He is on plavix and eliquis.   ROV 02/07/16 -- this is a follow-up visit for patient with a history of tobacco use found to have nodular opacities on CT scan of the abdomen and then CT scan of the chest in October 2017, then a repeat in November 2017. A final  repeat CT scan of the chest was done on 01/15/16 that I have personally reviewed. This shows his pulmonary nodules that were initially identified have decreased in size or disappeared. This is good news and is inconsistent with malignancy. He underwent pulmonary function testing on 01/22/16 that I have reviewed. This shows very severe obstruction with a positive bronchodilator response, hyperinflated lung volumes, decreased diffusion capacity that does not completely correct to normal when adjusted for the alveolar volume. He does have occasional exertional dyspnea. His  lasix was doubled 2 days ago due to crackles on exam.     Review of Systems As per [facility] [place]  Past Medical History:  Diagnosis Date  . 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 BENIGN PROSTATIC HYPERTROPHY, HX OF 02/24/2007  . CAD S/P PCI    a. 2007 Inf STEMI s/p Vision BMS  2 to RCA;  b. 2009 ISR Prox RCA Stent-->with DES;  c. 09/2015 PCI: LM nl, LAD 30p/m, D1 90, LCX nl, OM1 small, 80, OM2 90 (2.5x14 Biofreedom stent), OM3 small, RCA 30p ISR, 22m ISR, 108m, 85d, EF 50-55%.  . 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 Chronic atrial fibrillation ([place]) 11/06/2009   a. On Eliquis (CHA2DS2VASc = 5).  Rate controlled w/ BB and CCB.  Marland Kitchen Chronic diastolic CHF (congestive heart failure) ([place])    a. 09/2015 Echo: EF 55-60%, mildly dil LA.  . CKD (chronic kidney disease), stage III   . COLONIC POLYPS, HX OF 11/02/2006  . DIABETES MELLITUS, TYPE II 10/29/2006  . Diverticulosis   . History of tobacco abuse    a. Quit 09/2015.  Marland Kitchen HYPERLIPIDEMIA 10/29/2006  . Hypertensive heart disease with heart failure (HCC)      Family History  Problem Relation Age of Onset  . Heart attack Mother   . Diabetes Mother   . Heart attack Father   . Hypertension Father   . Heart attack Brother   . Diabetes Brother      Social History   Social History  . Marital status:  Married    Spouse name: N/A  . Number of children: 5  . Years of education: N/A   Occupational History  . [employer]    Social History Main Topics  . Smoking status: Former Smoker    Types: Cigarettes    Quit date: 09/08/2015  . Smokeless tobacco: Never Used     Comment: passive smoker as well  . Alcohol use No  . Drug use: No  . Sexual activity: Not on file   Other Topics Concern  . Not on file   Social History Narrative  . 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   Allergies  Allergen Reactions  . Contrast Media [Iodinated Diagnostic Agents] Anaphylaxis      Outpatient Medications Prior to Visit  Medication Sig Dispense Refill  . ACCU-CHEK AVIVA PLUS test strip CHECK BLOOD SUGAR ONCE DAILY AS NEEDED 100 each 4  . apixaban (ELIQUIS) 5 MG TABS tablet Take 1 tablet (5 mg total) by mouth 2 (two) times daily. 60 tablet 6  . atorvastatin (LIPITOR) 20 MG tablet TAKE 1 TABLET BY MOUTH EVERY DAY 90 tablet 1  . clopidogrel (PLAVIX) 75 MG tablet TAKE 1 TABLET BY MOUTH EVERY DAY 90 tablet 3  . furosemide (LASIX) 40 MG tablet TAKE 1 TABLET BY MOUTH EVERY DAY (Patient taking differently: TAKE 2 TABLET BY MOUTH EVERY DAY) 30 tablet 3  . glimepiride (AMARYL) 4 MG tablet Take 0.5 tablets (2 mg total) by mouth daily with breakfast. 90 tablet 3  . hydrALAZINE (APRESOLINE) 50 MG tablet Take 1 tablet (50 mg total) by mouth 3 (three) times daily. 100 tablet 6  . Insulin Pen Needle 29G X 12.7MM MISC 1 each by Does not apply route 1 day or 1 dose. 100 each 3  . Lancets (ACCU-CHEK MULTICLIX) lancets Once daily 250.00 100 each 3  . Liraglutide (VICTOZA) 18 MG/3ML SOPN Inject 1.8 mg into the skin daily. Takes 0.66ml's    . metFORMIN (GLUCOPHAGE) 1000 MG tablet TAKE 1 TABLET TWICE DAILY WITH A MEAL 180 tablet 1  . metoprolol (LOPRESSOR) 100 MG tablet Take 1 tablet (100 mg total) by mouth 2 (two) times daily. 180 tablet 1  . NITROSTAT 0.4 MG SL tablet PLACE 1 TABLET UNDER TONGUE EVERY 5 MINUTES AS NEEDED 25 tablet 0  . oxyCODONE (OXY IR/ROXICODONE) 5 MG immediate release tablet Take 1 tablet (5 mg total) by mouth every 6 (six) hours as needed for moderate pain or severe pain. 30 tablet 0  . polyethylene glycol powder (GLYCOLAX/MIRALAX) powder Take 17 g by mouth 2 (two) times daily as needed. (Patient taking differently: Take 17 g by mouth 2 (two) times daily as needed for mild constipation. ) 3350 g 1   No facility-administered medications prior to visit.          Objective:   Physical Exam Vitals:   02/07/16 0905 02/07/16 0906  BP:  122/90  Pulse:  100  SpO2:  92%   Weight: 196 lb (88.9 kg)   Height: 5\' 9"  (1.753 m)    Gen: Pleasant, well-nourished, in no distress,  normal affect  ENT: No lesions,  mouth clear,  oropharynx clear, no postnasal drip  Neck: No JVD, no TMG, no carotid bruits  Lungs: No use of accessory muscles, clear without rales or rhonchi  Cardiovascular: RRR, heart sounds normal, no murmur or gallops, no peripheral edema  Musculoskeletal: No deformities, no cyanosis or clubbing  Neuro: alert, non focal  Skin: Warm, no lesions or rashes   11/06/15 -- CT chest  COMPARISON:  CT abdomen and pelvis including lung bases November 04, 2015; chest radiograph September 29, 2015  FINDINGS: Cardiovascular: Prominence in the ascending thoracic aorta is noted with a measured transverse diameter of 4.0 x 3.7 cm. The aorta is tortuous in the descending thoracic aorta without apparent aneurysmal dilatation. There is no thoracic aortic dissection. The visualized great vessels show moderate atherosclerotic calcification at the origins of the right common carotid and left subclavian artery. There is no appreciable pericardial thickening. There are multiple foci of coronary artery calcification. No evident pulmonary embolus.  Mediastinum/Nodes: Thyroid appears unremarkable. There are scattered subcentimeter mediastinal lymph nodes. There is a sub- carinal lymph node measuring 1.5 x 1.4 cm. There is a lymph node in the right hilum measuring 1.1 x 0.9 cm.  Lungs/Pleura: There is somewhat irregular opacity in the superior lingula medially with a nodular appearing area measuring 1.0 x 1.0 cm, best seen on axial slice 89 series 3. This finding is also apparent on sagittal slice 82 series 5. On axial slice 0000000 series 3 and sagittal slice [id number] series 5, there is a nodular appearing opacity with subtle cavitation measuring 1.5 x 1.1 cm in the inferior lingula. There is a somewhat spiculated appearing lesion in the medial segment of the right  lower lobe measuring 1.5 x 1.4 cm. This lesion is best seen on axial slice 123456 series 3. There is a somewhat irregular nodular appearing opacity in the posterior segment of the left lower lobe measuring 1.2 x 1.0 cm, best seen on axial slice [id number] series 3. There is scarring in each lung base as well. There is a nodular opacity in the posterior segment right lower lobe abutting the pleura measuring 1.0 x 0.7 cm, best seen on axial slice [id number] series 3. There is lower lobe bronchiectatic change bilaterally, slightly more severe on the right than on the left. There is no appreciable pleural effusion. There is mild left base atelectatic change.  Upper Abdomen: In the visualized upper abdomen, there is atherosclerotic calcification in the aorta. Incomplete visualization of abdominal aortic aneurysm noted, described on recent CT abdomen. Liver contour is somewhat lobular suggesting a degree of underlying cirrhosis. Scattered liver granulomas noted. No new lesion identified in the upper abdomen compared to 2 days prior.  Musculoskeletal: There is degenerative change in the thoracic spine. No blastic or lytic bone lesions are evident. 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  IMPRESSION: Irregular nodular opacities in the lung parenchyma as summarized above. Subtle questionable cavitation in a lesion in the inferior lingula noted. Largest nodular opacity measures 1.5 x 1.4 cm. This appearance is concerning for multifocal neoplasm. In the appropriate clinical setting, septic emboli could present similarly. There are mildly prominent sub- carinal and right hilar lymph nodes which could be either of malignant or reactive etiology. Given concern for potential multifocal neoplasm and possible lymph node involvement, nuclear medicine PET study advised to further evaluate.      Assessment & Plan:  COPD (chronic obstructive pulmonary disease) ([place]) Very severe obstruction noted on his  spirometry. He does have daily symptoms. Likely GOLD C. Stiolto to see if he benefits. Walking oximetry today. Follow in 1 month here to assess benefit of BD  Lung nodules Largely resolved on his most recent CT scan of the chest. This is inconsistent with malignancy. I do not believe we need to perform scheduled follow up based on  the iimprovement/.   Baltazar Apo, MD, PhD 02/07/2016, 9:30 AM [facility] [phone] or if no answer [phone]

## 2016-02-07 NOTE — Progress Notes (Signed)
Patient seen in the office today and instructed on use of Stiolto.  Patient expressed understanding and demonstrated technique. 

## 2016-02-12 ENCOUNTER — Other Ambulatory Visit: Payer: Self-pay

## 2016-02-12 ENCOUNTER — Other Ambulatory Visit: Payer: Self-pay | Admitting: *Deleted

## 2016-02-12 MED ORDER — FUROSEMIDE 40 MG PO TABS: 80.0000 mg | ORAL_TABLET | Freq: Every day | ORAL | 5 refills | Status: DC

## 2016-02-13 ENCOUNTER — Other Ambulatory Visit: Payer: Self-pay | Admitting: Internal Medicine

## 2016-02-13 ENCOUNTER — Ambulatory Visit: Payer: Self-pay | Admitting: *Deleted

## 2016-02-14 ENCOUNTER — Telehealth (HOSPITAL_COMMUNITY): Payer: Self-pay | Admitting: Internal Medicine

## 2016-02-14 NOTE — Telephone Encounter (Signed)
S/w Robert at [facility] verifying [employer].  [id number] co-pay, no Deductible, $4,400 out of pocket per year with no limits, reference #338.Marland Kitchen... KJ

## 2016-02-18 ENCOUNTER — Telehealth: Payer: Self-pay | Admitting: Internal Medicine

## 2016-02-18 NOTE — Telephone Encounter (Signed)
Spoke with pt discussed all of pt's medications. Pt verbalized understanding.

## 2016-02-18 NOTE — Telephone Encounter (Signed)
Pt went to the pharmacy and state that he got more medicine than he this he should be taking need some assistance with getting it straighten out.  He was told by the pharmacy that he was no longer taking one medication at a different time and when he went to go get the two that he had taken to the pharmacy they had it ready to pick-up.

## 2016-02-21 ENCOUNTER — Other Ambulatory Visit: Payer: Self-pay | Admitting: *Deleted

## 2016-02-21 ENCOUNTER — Encounter: Payer: Self-pay | Admitting: *Deleted

## 2016-02-21 NOTE — Patient Outreach (Signed)
[facility]) Care Management   02/21/2016  THEOPHILE HARVIE 03-01-1940 [id number]  Jason Fisher is an 76 y.o. male  Subjective: Pt is doing very well. Has seen Dr. Lamonte Sakai and his last chest scan showed dimishment of previous nodules. Pt has been started on a new inhaler which he says does seem to help his breathing. His weight is stable. Glucose control is excellent.  Objective:   Review of Systems  Constitutional: Negative.   HENT: Negative.   Eyes: Negative.   Respiratory: Positive for cough and sputum production.        Beginning to feel like he is getting a cold. Very little mucous production.  Cardiovascular: Negative.   Gastrointestinal: Negative.   Genitourinary: Negative.   Musculoskeletal: Negative.   Skin: Negative.   Neurological: Negative.   Endo/Heme/Allergies: Bruises/bleeds easily.  Psychiatric/Behavioral: Negative.    BP 138/90 (BP Location: Right Arm, Patient Position: Sitting, Cuff Size: Normal)   Pulse 69   Resp 18   Wt 193 lb (87.5 kg)   SpO2 96%   BMI 28.50 kg/m  FBS 94.  Physical Exam  Constitutional: He is oriented to person, place, and time. He appears well-developed and well-nourished.  Cardiovascular:  AFIB  Respiratory: Effort normal and breath sounds normal.  GI: Soft. Bowel sounds are normal.  Musculoskeletal: Normal range of motion.  Neurological: He is alert and oriented to person, place, and time.  Skin: Skin is warm and dry.  Psychiatric: He has a normal mood and affect. His behavior is normal. Judgment and thought content normal.    Encounter Medications:   Outpatient Encounter Prescriptions as of 02/21/2016  Medication Sig Note  . ACCU-CHEK AVIVA PLUS test strip CHECK BLOOD SUGAR ONCE DAILY AS NEEDED   . apixaban (ELIQUIS) 5 MG TABS tablet Take 1 tablet (5 mg total) by mouth 2 (two) times daily.   Marland Kitchen atorvastatin (LIPITOR) 20 MG tablet TAKE 1 TABLET BY MOUTH EVERY DAY   . clopidogrel (PLAVIX) 75 MG tablet TAKE 1  TABLET BY MOUTH EVERY DAY   . furosemide (LASIX) 40 MG tablet Take 2 tablets (80 mg total) by mouth daily.   Marland Kitchen glimepiride (AMARYL) 4 MG tablet Take 0.5 tablets (2 mg total) by mouth daily with breakfast.   . hydrALAZINE (APRESOLINE) 50 MG tablet Take 1 tablet (50 mg total) by mouth 3 (three) times daily.   . Insulin Pen Needle 29G X 12.7MM MISC 1 each by Does not apply route 1 day or 1 dose.   . Lancets (ACCU-CHEK MULTICLIX) lancets Once daily 250.00   . Liraglutide (VICTOZA) 18 MG/3ML SOPN Inject 1.8 mg into the skin daily. Takes 0.41m's   . metFORMIN (GLUCOPHAGE) 1000 MG tablet TAKE 1 TABLET TWICE DAILY WITH A MEAL   . metoprolol (LOPRESSOR) 100 MG tablet Take 1 tablet (100 mg total) by mouth 2 (two) times daily.   . Tiotropium Bromide-Olodaterol (STIOLTO RESPIMAT) 2.5-2.5 MCG/ACT AERS Inhale 2 puffs into the lungs daily.   . metoprolol (LOPRESSOR) 100 MG tablet TAKE 1 TABLET TWICE DAILY   . NITROSTAT 0.4 MG SL tablet PLACE 1 TABLET UNDER TONGUE EVERY 5 MINUTES AS NEEDED (Patient not taking: Reported on 02/21/2016)   . oxyCODONE (OXY IR/ROXICODONE) 5 MG immediate release tablet Take 1 tablet (5 mg total) by mouth every 6 (six) hours as needed for moderate pain or severe pain. (Patient not taking: Reported on 02/21/2016) 11/13/2015: Only as needed.   . polyethylene glycol powder (GLYCOLAX/MIRALAX) powder Take 17 g  by mouth 2 (two) times daily as needed. (Patient not taking: Reported on 02/21/2016)    No facility-administered encounter medications on file as of 02/21/2016.     Functional Status:   In your present state of health, do you have any difficulty performing the following activities: 11/04/2015 11/04/2015  Hearing? - N  Vision? - N  Difficulty concentrating or making decisions? - N  Walking or climbing stairs? - Y  Dressing or bathing? - N  Doing errands, shopping? N -  Preparing Food and eating ? - -  Using the Toilet? - -  In the past six months, have you accidently leaked urine? -  -  Do you have problems with loss of bowel control? - -  Managing your Medications? - -  Managing your Finances? - -  Housekeeping or managing your Housekeeping? - -  Some recent data might be hidden    Fall/Depression Screening:    PHQ 2/9 Scores 10/11/2015 04/22/2015 03/07/2014 10/31/2013 10/07/2012  PHQ - 2 Score 0 0 0 0 0    Assessment:  CHF Stable                         AFIB controlled  Plan: I am going to close pt case as he has met all his goals.           I have encouraged him to continue all his self management skills that he has learned.           I have encouraged him to drink plenty of fluids, take APAP is feels feverish and guiaffenesin sugarless preparation if starts to cough, call MD if sxs feel                   like he needs additional support  Deloria Lair [facility] [place] [phone]

## 2016-02-24 ENCOUNTER — Encounter: Payer: Self-pay | Admitting: *Deleted

## 2016-03-03 ENCOUNTER — Ambulatory Visit (INDEPENDENT_AMBULATORY_CARE_PROVIDER_SITE_OTHER): Payer: Medicare Other | Admitting: Ophthalmology

## 2016-03-03 ENCOUNTER — Other Ambulatory Visit: Payer: Self-pay | Admitting: Internal Medicine

## 2016-03-03 DIAGNOSIS — E11319 Type 2 diabetes mellitus with unspecified diabetic retinopathy without macular edema: Secondary | ICD-10-CM

## 2016-03-03 DIAGNOSIS — I10 Essential (primary) hypertension: Secondary | ICD-10-CM | POA: Diagnosis not present

## 2016-03-03 DIAGNOSIS — H35033 Hypertensive retinopathy, bilateral: Secondary | ICD-10-CM | POA: Diagnosis not present

## 2016-03-03 DIAGNOSIS — H43813 Vitreous degeneration, bilateral: Secondary | ICD-10-CM

## 2016-03-03 DIAGNOSIS — H3509 Other intraretinal microvascular abnormalities: Secondary | ICD-10-CM

## 2016-03-03 DIAGNOSIS — E113293 Type 2 diabetes mellitus with mild nonproliferative diabetic retinopathy without macular edema, bilateral: Secondary | ICD-10-CM

## 2016-03-04 ENCOUNTER — Ambulatory Visit (INDEPENDENT_AMBULATORY_CARE_PROVIDER_SITE_OTHER): Payer: Medicare Other | Admitting: Internal Medicine

## 2016-03-04 ENCOUNTER — Encounter: Payer: Self-pay | Admitting: Internal Medicine

## 2016-03-04 VITALS — BP 142/96 | HR 92 | Temp 97.7°F | Wt 198.8 lb

## 2016-03-04 DIAGNOSIS — I10 Essential (primary) hypertension: Secondary | ICD-10-CM | POA: Diagnosis not present

## 2016-03-04 NOTE — Progress Notes (Signed)
Subjective:    Patient ID: Jason Fisher, male    DOB: 28-Nov-1940, 76 y.o.   MRN: [id number]  HPI  Wt Readings from Last 3 Encounters:  03/04/16 198 lb 12.8 oz (90.2 kg)  02/21/16 193 lb (87.5 kg)  02/07/16 196 lb (88.9 kg)   Lab Results  Component Value Date   HGBA1C 7.0 (H) 02/05/2016    76 year old patient who has a history of COPD as well as diastolic heart failure.  He is doing much better today with very little in the way of DOE.  He is followed by cardiology and pulmonary medicine  He has type 2 diabetes.  He has essential hypertension  Past Medical History:  Diagnosis Date  . 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 BENIGN PROSTATIC HYPERTROPHY, HX OF 02/24/2007  . CAD S/P PCI    a. 2007 Inf STEMI s/p Vision BMS  2 to RCA;  b. 2009 ISR Prox RCA Stent-->with DES;  c. 09/2015 PCI: LM nl, LAD 30p/m, D1 90, LCX nl, OM1 small, 80, OM2 90 (2.5x14 Biofreedom stent), OM3 small, RCA 30p ISR, 82m ISR, 24m, 85d, EF 50-55%.  . 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 Chronic atrial fibrillation ([place]) 11/06/2009   a. On Eliquis (CHA2DS2VASc = 5).  Rate controlled w/ BB and CCB.  Marland Kitchen Chronic diastolic CHF (congestive heart failure) ([place])    a. 09/2015 Echo: EF 55-60%, mildly dil LA.  . CKD (chronic kidney disease), stage III   . COLONIC POLYPS, HX OF 11/02/2006  . DIABETES MELLITUS, TYPE II 10/29/2006  . Diverticulosis   . History of tobacco abuse    a. Quit 09/2015.  Marland Kitchen HYPERLIPIDEMIA 10/29/2006  . Hypertensive heart disease with heart failure [facility])      Social History   Social History  . Marital status: Married    Spouse name: N/A  . Number of children: 5  . Years of education: N/A   Occupational History  . [employer]    Social History Main Topics  . Smoking status: Former Smoker    Types: Cigarettes    Quit date: 09/08/2015  . Smokeless tobacco: Never Used     Comment: passive smoker as well  . Alcohol use No  . Drug use: No  . Sexual activity: Not on file   Other  Topics Concern  . Not on file   Social History Narrative  . No narrative on file    Past Surgical History:  Procedure Laterality Date  . CARDIAC CATHETERIZATION N/A 09/26/2015   Procedure: Left Heart Cath and Coronary Angiography;  Surgeon: Peter M Martinique, MD;  Location: [place] [facility];  Service: Cardiovascular;  Laterality: N/A;  . CARDIAC CATHETERIZATION N/A 09/26/2015   Procedure: Coronary Stent Intervention;  Surgeon: Peter M Martinique, MD;  Location: [place] [facility];  Service: Cardiovascular;  Laterality: N/A;  . CATARACT EXTRACTION    . COLONOSCOPY    . CORONARY ANGIOPLASTY WITH STENT PLACEMENT  2007   Inferior STEMI 2007 - RCA PCI with 2 Vision BMS; Abnormal Myoview in 2009 - pRCA ins-stent restenosis & unstented disease - DES PCI Promus 3.0 x 20 mm (3.5 mm)  . INCISION AND DRAINAGE PERIRECTAL ABSCESS N/A 11/06/2015   Procedure: IRRIGATION AND DEBRIDEMENT PERIRECTAL ABSCESS, POSSIBLE HEMORRHOIDECTOMY;  Surgeon: Coralie Keens, MD;  Location: [place];  Service: General;  Laterality: N/A;  . SHOULDER SURGERY Right     Family History  Problem Relation Age of Onset  . Heart attack Mother   .  Diabetes Mother   . Heart attack Father   . Hypertension Father   . Heart attack Brother   . Diabetes Brother     Allergies  Allergen Reactions  . Contrast Media [Iodinated Diagnostic Agents] Anaphylaxis    Current Outpatient Prescriptions on File Prior to Visit  Medication Sig Dispense Refill  . ACCU-CHEK AVIVA PLUS test strip CHECK BLOOD SUGAR ONCE DAILY AS NEEDED 100 each 4  . atorvastatin (LIPITOR) 20 MG tablet TAKE 1 TABLET BY MOUTH EVERY DAY 90 tablet 1  . clopidogrel (PLAVIX) 75 MG tablet TAKE 1 TABLET BY MOUTH EVERY DAY 90 tablet 3  . ELIQUIS 5 MG TABS tablet TAKE 1 TABLET (5 MG TOTAL) BY MOUTH 2 (TWO) TIMES DAILY. 60 tablet 5  . furosemide (LASIX) 40 MG tablet Take 2 tablets (80 mg total) by mouth daily. 60 tablet 5  . glimepiride (AMARYL) 4 MG tablet Take 0.5 tablets (2  mg total) by mouth daily with breakfast. 90 tablet 3  . hydrALAZINE (APRESOLINE) 50 MG tablet Take 1 tablet (50 mg total) by mouth 3 (three) times daily. 100 tablet 6  . Insulin Pen Needle 29G X 12.7MM MISC 1 each by Does not apply route 1 day or 1 dose. 100 each 3  . Lancets (ACCU-CHEK MULTICLIX) lancets Once daily 250.00 100 each 3  . Liraglutide (VICTOZA) 18 MG/3ML SOPN Inject 1.8 mg into the skin daily. Takes 0.8ml's    . metFORMIN (GLUCOPHAGE) 1000 MG tablet TAKE 1 TABLET TWICE DAILY WITH A MEAL 180 tablet 1  . metoprolol (LOPRESSOR) 100 MG tablet Take 1 tablet (100 mg total) by mouth 2 (two) times daily. 180 tablet 1  . metoprolol (LOPRESSOR) 100 MG tablet TAKE 1 TABLET TWICE DAILY 180 tablet 1  . NITROSTAT 0.4 MG SL tablet PLACE 1 TABLET UNDER TONGUE EVERY 5 MINUTES AS NEEDED 25 tablet 0  . oxyCODONE (OXY IR/ROXICODONE) 5 MG immediate release tablet Take 1 tablet (5 mg total) by mouth every 6 (six) hours as needed for moderate pain or severe pain. 30 tablet 0  . polyethylene glycol powder (GLYCOLAX/MIRALAX) powder Take 17 g by mouth 2 (two) times daily as needed. 3350 g 1  . Tiotropium Bromide-Olodaterol (STIOLTO RESPIMAT) 2.5-2.5 MCG/ACT AERS Inhale 2 puffs into the lungs daily.     No current facility-administered medications on file prior to visit.     BP (!) 142/96 (BP Location: Left Arm, Patient Position: Sitting, Cuff Size: Normal)   Pulse 92   Temp 97.7 F (36.5 C) (Oral)   Wt 198 lb 12.8 oz (90.2 kg)   SpO2 97%   BMI 29.36 kg/m    Review of Systems  Constitutional: Negative for appetite change, chills, fatigue and fever.  HENT: Negative for congestion, dental problem, ear pain, hearing loss, sore throat, tinnitus, trouble swallowing and voice change.   Eyes: Negative for pain, discharge and visual disturbance.  Respiratory: Negative for cough, chest tightness, wheezing and stridor.   Cardiovascular: Negative for chest pain, palpitations and leg swelling.    Gastrointestinal: Negative for abdominal distention, abdominal pain, blood in stool, constipation, diarrhea, nausea and vomiting.  Genitourinary: Negative for difficulty urinating, discharge, flank pain, genital sores, hematuria and urgency.  Musculoskeletal: Negative for arthralgias, back pain, gait problem, joint swelling, myalgias and neck stiffness.  Skin: Negative for rash.  Neurological: Negative for dizziness, syncope, speech difficulty, weakness, numbness and headaches.  Hematological: Negative for adenopathy. Does not bruise/bleed easily.  Psychiatric/Behavioral: Negative for behavioral problems and dysphoric mood. The  patient is not nervous/anxious.        Objective:   Physical Exam  Constitutional: He is oriented to person, place, and time. He appears well-developed.  Repeat blood pressure 140/80  HENT:  Head: Normocephalic.  Right Ear: External ear normal.  Left Ear: External ear normal.  Eyes: Conjunctivae and EOM are normal.  Neck: Normal range of motion.  Cardiovascular: Normal rate and normal heart sounds.   irregular  Pulmonary/Chest: He has rales.  Abdominal: Bowel sounds are normal.  Musculoskeletal: Normal range of motion. He exhibits no edema or tenderness.  Neurological: He is alert and oriented to person, place, and time.  Psychiatric: He has a normal mood and affect. His behavior is normal.          Assessment & Plan:   COPD Diastolic heart failure Diabetes mellitus type 2  Patient is clinically improved and denies any further DOE.  We'll challenge.  Patient on furosemide 40 mg once daily.  He will increase to a twice a day dose.  If he develops any worsening DOE, weight gain or peripheral edema.  Follow-up pulmonary medicine and cardiology  Follow.  3 months for diabetes management  Nyoka Cowden

## 2016-03-04 NOTE — Progress Notes (Signed)
Pre visit review using our clinic review tool, if applicable. No additional management support is needed unless otherwise documented below in the visit note. 

## 2016-03-04 NOTE — Patient Instructions (Signed)
Limit your sodium (Salt) intake  Decrease furosemide to 40 mg once daily in the morning.  Increase to twice daily if you develop worsening shortness of breath with exertion weight gain or worsening swelling of the legs.   Please check your hemoglobin A1c every 3 months

## 2016-03-09 ENCOUNTER — Ambulatory Visit (INDEPENDENT_AMBULATORY_CARE_PROVIDER_SITE_OTHER): Payer: Medicare Other | Admitting: Acute Care

## 2016-03-09 ENCOUNTER — Encounter: Payer: Self-pay | Admitting: Acute Care

## 2016-03-09 VITALS — BP 118/84 | HR 95 | Ht 69.0 in | Wt 194.0 lb

## 2016-03-09 DIAGNOSIS — R918 Other nonspecific abnormal finding of lung field: Secondary | ICD-10-CM

## 2016-03-09 DIAGNOSIS — J449 Chronic obstructive pulmonary disease, unspecified: Secondary | ICD-10-CM | POA: Diagnosis not present

## 2016-03-09 MED ORDER — TIOTROPIUM BROMIDE-OLODATEROL 2.5-2.5 MCG/ACT IN AERS: 2.0000 | INHALATION_SPRAY | Freq: Every day | RESPIRATORY_TRACT | 0 refills | Status: DC

## 2016-03-09 MED ORDER — LEVALBUTEROL TARTRATE 45 MCG/ACT IN AERO: 1.0000 | INHALATION_SPRAY | Freq: Four times a day (QID) | RESPIRATORY_TRACT | 6 refills | Status: DC | PRN

## 2016-03-09 NOTE — Assessment & Plan Note (Signed)
Very Severe Obstruction on Spirometry with daily symptoms ( ? COPD C) + BD response per PFT's Stioloto added 4 weeks ago per Dr. Lamonte Sakai Per pt and wife, they think it is helping and would like to continue therapy. Plan: Continue Stiolto 2 puffs once daily. Referral to Pulmonary rehab Prescription for rescue inhaler ( Xopenex as hx of a-fib) Follow up appointment with Dr. Lamonte Sakai 3 months Please contact office for sooner follow up if symptoms do not improve or worsen or seek emergency care

## 2016-03-09 NOTE — Progress Notes (Signed)
History of Present Illness Jason Fisher is a 76 y.o. male former with COPD Stage C, diastolic HF, and history of Atrial Fibrillation. She is followed by Dr.  Lucia Fisher: Patient with a history of tobacco use found to have nodular opacities on CT scan of the abdomen and then CT scan of the chest in October 2017, then a repeat in November 2017. A final  repeat CT scan of the chest was done on 01/15/16 . This shows his pulmonary nodules that were initially identified have decreased in size or disappeared. This  is inconsistent with malignancy. He underwent pulmonary function testing on 01/22/16.This shows very severe obstruction with a positive bronchodilator response, hyperinflated lung volumes, decreased diffusion capacity that does not completely correct to normal when adjusted for the alveolar volume. He does have occasional exertional dyspnea.   03/09/2016 Follow Up OV: Pt. Returns for follow up of use of new medication. He started [facility] 02/07/2016. He is unable to tell me if it is helping, but his wife states she feels it is. She states he is coughing less and in general is doing better.He is compliant with his medication daily.He is coughing less and has fewer secretions. He is following up with cardiology and is PCP on a regular basis. Lasix was dropped to once daily, and patient is compliant with weights daily.This is most likely multi-factorial in etiology as the patient also has history of diastolic heart failure, atrial fib and he is deconditioned at baseline.He wants to continue the [employer].He does still have occasional exertional dyspnea, especially going up stairs. He is deconditioned at baseline. We  discussed Pulmonary Rehab. Both he and his wife are interested.We will send referral .We discussed being proactive in seeking treatment with any upper respiratory changes in secretions, fever etc. Both he and his wife verbalized understanding..  Tests PFT's Results for Jason Fisher, Jason Fisher (MRN  [id number]) as of 03/09/2016 09:04  Ref. Range 01/22/2016 15:26  FVC-Pre Latest Units: L 2.19  FVC-%Pred-Pre Latest Units: % 53  FEV1-Pre Latest Units: L 1.11  FEV1-%Pred-Pre Latest Units: % 37  Pre FEV1/FVC ratio Latest Units: % 51  FEV1FVC-%Pred-Pre Latest Units: % 69  FEF 25-75 Pre Latest Units: L/sec 0.45  FEF2575-%Pred-Pre Latest Units: % 21  FEV6-Pre Latest Units: L 2.14  FEV6-%Pred-Pre Latest Units: % 56  Pre FEV6/FVC Ratio Latest Units: % 98  FEV6FVC-%Pred-Pre Latest Units: % 104  FVC-Post Latest Units: L 2.46  FVC-%Pred-Post Latest Units: % 60  FVC-%Change-Post Latest Units: % 12  FEV1-Post Latest Units: L 1.43  FEV1-%Pred-Post Latest Units: % 48  FEV1-%Change-Post Latest Units: % 29  Post FEV1/FVC ratio Latest Units: % 58  FEV1FVC-%Change-Post Latest Units: % 15  FEF 25-75 Post Latest Units: L/sec 0.85  FEF2575-%Pred-Post Latest Units: % 39  FEF2575-%Change-Post Latest Units: % 87  FEV6-Post Latest Units: L 2.37  FEV6-%Pred-Post Latest Units: % 62  FEV6-%Change-Post Latest Units: % 10  Post FEV6/FVC ratio Latest Units: % 97  FEV6FVC-%Pred-Post Latest Units: % 102  FEV6FVC-%Change-Post Latest Units: % -1  TLC Latest Units: L 5.98  TLC % pred Latest Units: % 87  RV Latest Units: L 3.67  RV % pred Latest Units: % 146  DLCO cor Latest Units: ml/min/mmHg 16.79  DLCO cor % pred Latest Units: % 54  DLCO unc Latest Units: ml/min/mmHg 15.65  DLCO unc % pred Latest Units: % 50  DL/VA Latest Units: ml/min/mmHg/L 3.54  DL/VA % pred Latest Units: % 78    Past medical hx Past  Medical History:  Diagnosis Date  . 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 BENIGN PROSTATIC HYPERTROPHY, HX OF 02/24/2007  . CAD S/P PCI    a. 2007 Inf STEMI s/p Vision BMS  2 to RCA;  b. 2009 ISR Prox RCA Stent-->with DES;  c. 09/2015 PCI: LM nl, LAD 30p/m, D1 90, LCX nl, OM1 small, 80, OM2 90 (2.5x14 Biofreedom stent), OM3 small, RCA 30p ISR, 35m ISR, 110m, 85d, EF 50-55%.  . 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 Chronic atrial fibrillation ([place]) 11/06/2009   a. On Eliquis (CHA2DS2VASc = 5).  Rate controlled w/ BB and CCB.  Marland Kitchen Chronic diastolic CHF (congestive heart failure) ([place])    a. 09/2015 Echo: EF 55-60%, mildly dil LA.  . CKD (chronic kidney disease), stage III   . COLONIC POLYPS, HX OF 11/02/2006  . DIABETES MELLITUS, TYPE II 10/29/2006  . Diverticulosis   . History of tobacco abuse    a. Quit 09/2015.  Marland Kitchen HYPERLIPIDEMIA 10/29/2006  . Hypertensive heart disease with heart failure (HCC)      Past surgical hx, Family hx, Social hx all reviewed.  Current Outpatient Prescriptions on File Prior to Visit  Medication Sig  . ACCU-CHEK AVIVA PLUS test strip CHECK BLOOD SUGAR ONCE DAILY AS NEEDED  . atorvastatin (LIPITOR) 20 MG tablet TAKE 1 TABLET BY MOUTH EVERY DAY  . clopidogrel (PLAVIX) 75 MG tablet TAKE 1 TABLET BY MOUTH EVERY DAY  . ELIQUIS 5 MG TABS tablet TAKE 1 TABLET (5 MG TOTAL) BY MOUTH 2 (TWO) TIMES DAILY.  . furosemide (LASIX) 40 MG tablet Take 2 tablets (80 mg total) by mouth daily.  Marland Kitchen glimepiride (AMARYL) 4 MG tablet Take 0.5 tablets (2 mg total) by mouth daily with breakfast.  . hydrALAZINE (APRESOLINE) 50 MG tablet Take 1 tablet (50 mg total) by mouth 3 (three) times daily.  . Insulin Pen Needle 29G X 12.7MM MISC 1 each by Does not apply route 1 day or 1 dose.  . Lancets (ACCU-CHEK MULTICLIX) lancets Once daily 250.00  . Liraglutide (VICTOZA) 18 MG/3ML SOPN Inject 1.8 mg into the skin daily. Takes 0.41ml's  . metFORMIN (GLUCOPHAGE) 1000 MG tablet TAKE 1 TABLET TWICE DAILY WITH A MEAL  . metoprolol (LOPRESSOR) 100 MG tablet Take 1 tablet (100 mg total) by mouth 2 (two) times daily.  . metoprolol (LOPRESSOR) 100 MG tablet TAKE 1 TABLET TWICE DAILY  . NITROSTAT 0.4 MG SL tablet PLACE 1 TABLET UNDER TONGUE EVERY 5 MINUTES AS NEEDED  . oxyCODONE (OXY IR/ROXICODONE) 5 MG immediate release tablet Take 1 tablet (5 mg total) by mouth every 6 (six) hours as needed for moderate  pain or severe pain.  . polyethylene glycol powder (GLYCOLAX/MIRALAX) powder Take 17 g by mouth 2 (two) times daily as needed.  . Tiotropium Bromide-Olodaterol (STIOLTO RESPIMAT) 2.5-2.5 MCG/ACT AERS Inhale 2 puffs into the lungs daily.   No current facility-administered medications on file prior to visit.      Allergies  Allergen Reactions  . Contrast Media [Iodinated Diagnostic Agents] Anaphylaxis    Review Of Systems:  Constitutional:   No  weight loss, night sweats,  Fevers, chills, fatigue, or  lassitude.  HEENT:   No headaches,  Difficulty swallowing,  Tooth/dental problems, or  Sore throat,                No sneezing, itching, ear ache, nasal congestion, post nasal drip,   CV:  No chest pain,  Orthopnea, PND, swelling in lower extremities,  anasarca, dizziness, palpitations, syncope.   GI  No heartburn, indigestion, abdominal pain, nausea, vomiting, diarrhea, change in bowel habits, loss of appetite, bloody stools.   Resp: + shortness of breath with exertion not  at rest.  No excess mucus, no productive cough,  No non-productive cough,  No coughing up of blood.  No change in color of mucus.  Occasional wheezing.  No chest wall deformity  Skin: no rash or lesions.  GU: no dysuria, change in color of urine, no urgency or frequency.  No flank pain, no hematuria   MS:  No joint pain or swelling.  No decreased range of motion.  No back pain.  Psych:  No change in mood or affect. No depression or anxiety.  No memory loss.   Vital Signs BP 118/84 (BP Location: Left Arm, Cuff Size: Normal)   Pulse 95   Ht 5\' 9"  (1.753 m)   Wt 194 lb (88 kg)   SpO2 94%   BMI 28.65 kg/m    Physical Exam:  General- No distress,  A&Ox3, pleasant ENT: No sinus tenderness, TM clear, pale nasal mucosa, no oral exudate,no post nasal drip, no LAN Cardiac: S1, S2, regular rate and rhythm, no murmur Chest: No wheeze/ rales/ dullness; no accessory muscle use, no nasal flaring, no sternal  retractions, diminished per bases Abd.: Soft Non-tender, obese Ext: No clubbing cyanosis, edema Neuro:  Deconditioned at baseline Skin: No rashes, warm and dry Psych: normal mood and behavior   Assessment/Plan  COPD (chronic obstructive pulmonary disease) (HCC) Very Severe Obstruction on Spirometry with daily symptoms ( ? COPD C) + BD response per PFT's Stioloto added 4 weeks ago per Dr. Lamonte Sakai Per pt and wife, they think it is helping and would like to continue therapy. Plan: Continue Stiolto 2 puffs once daily. Referral to Pulmonary rehab Prescription for rescue inhaler ( Xopenex as hx of a-fib) Follow up appointment with Dr. Lamonte Sakai 3 months Please contact office for sooner follow up if symptoms do not improve or worsen or seek emergency care    Pulmonary nodules Pulmonary Nodules in former smoker quit 2017 Plan: Significantly resolved on recent CT Per Dr. Lamonte Sakai, no scheduled follow up due to improvement.    Magdalen Spatz, NP 03/09/2016  10:28 AM

## 2016-03-09 NOTE — Patient Instructions (Addendum)
It is nice to  meet you today. Continue your Stiolto daily as you have been doing.( 2 puffs once daily) We will provide you with a prescription today. We will prescribe a rescue inhaler called Xopenex. If your insurance denies Xopenex, or it is too expensive,call and we will switch to Albuterol rescue inhaler. Use this for breakthrough shortness of breath or wheezing as needed up to every 6 hours. Referral to pulmonary rehab ( Dr. Lamonte Sakai) Follow up with Dr. Lamonte Sakai in 3 months. Please contact office for sooner follow up if symptoms do not improve or worsen or seek emergency care

## 2016-03-09 NOTE — Assessment & Plan Note (Signed)
Pulmonary Nodules in former smoker quit 2017 Plan: Significantly resolved on recent CT Per Dr. Lamonte Sakai, no scheduled follow up due to improvement.

## 2016-03-16 ENCOUNTER — Telehealth: Payer: Self-pay

## 2016-03-16 NOTE — Telephone Encounter (Signed)
PA request received from [facility] in [place] for Xopenex HFA.  Pt has documented contraindication d/t hx of aFib for covered alternative, Albuterol. PA initiated through [email or web address] [id number] PA has been approved through 02/01/2017 [id number] Pharmacy aware of approval.  Nothing further needed.

## 2016-03-30 ENCOUNTER — Telehealth (HOSPITAL_COMMUNITY): Payer: Self-pay | Admitting: *Deleted

## 2016-04-01 ENCOUNTER — Encounter: Payer: Self-pay | Admitting: *Deleted

## 2016-04-01 DIAGNOSIS — Z006 Encounter for examination for normal comparison and control in clinical research program: Secondary | ICD-10-CM

## 2016-04-01 NOTE — Progress Notes (Signed)
LEADERS FREE II Research study month 6 follow up visit completed. Patient has been doing well except for occasional dyspnea. He denies chest pain. EKG obtained. His next research visit is due no later than 18/SEP/2018.

## 2016-04-17 ENCOUNTER — Other Ambulatory Visit: Payer: Self-pay | Admitting: Internal Medicine

## 2016-04-20 ENCOUNTER — Telehealth (HOSPITAL_COMMUNITY): Payer: Self-pay | Admitting: *Deleted

## 2016-04-21 ENCOUNTER — Emergency Department (HOSPITAL_COMMUNITY): Payer: Medicare Other

## 2016-04-21 ENCOUNTER — Encounter (HOSPITAL_COMMUNITY): Payer: Self-pay | Admitting: Emergency Medicine

## 2016-04-21 ENCOUNTER — Inpatient Hospital Stay (HOSPITAL_COMMUNITY)
Admission: EM | Admit: 2016-04-21 | Discharge: 2016-04-24 | DRG: 291 | Disposition: A | Discharge status: Home / Self Care | Payer: Medicare Other | Attending: Family Medicine | Admitting: Family Medicine

## 2016-04-21 DIAGNOSIS — E785 Hyperlipidemia, unspecified: Secondary | ICD-10-CM | POA: Diagnosis present

## 2016-04-21 DIAGNOSIS — I2510 Atherosclerotic heart disease of native coronary artery without angina pectoris: Secondary | ICD-10-CM | POA: Diagnosis not present

## 2016-04-21 DIAGNOSIS — E1122 Type 2 diabetes mellitus with diabetic chronic kidney disease: Secondary | ICD-10-CM | POA: Diagnosis present

## 2016-04-21 DIAGNOSIS — Z833 Family history of diabetes mellitus: Secondary | ICD-10-CM

## 2016-04-21 DIAGNOSIS — I130 Hypertensive heart and chronic kidney disease with heart failure and stage 1 through stage 4 chronic kidney disease, or unspecified chronic kidney disease: Secondary | ICD-10-CM | POA: Diagnosis not present

## 2016-04-21 DIAGNOSIS — Z9861 Coronary angioplasty status: Secondary | ICD-10-CM | POA: Diagnosis not present

## 2016-04-21 DIAGNOSIS — Z91041 Radiographic dye allergy status: Secondary | ICD-10-CM

## 2016-04-21 DIAGNOSIS — N400 Enlarged prostate without lower urinary tract symptoms: Secondary | ICD-10-CM | POA: Diagnosis not present

## 2016-04-21 DIAGNOSIS — I4891 Unspecified atrial fibrillation: Secondary | ICD-10-CM | POA: Diagnosis present

## 2016-04-21 DIAGNOSIS — R079 Chest pain, unspecified: Secondary | ICD-10-CM | POA: Diagnosis present

## 2016-04-21 DIAGNOSIS — I482 Chronic atrial fibrillation: Secondary | ICD-10-CM | POA: Diagnosis not present

## 2016-04-21 DIAGNOSIS — Z8249 Family history of ischemic heart disease and other diseases of the circulatory system: Secondary | ICD-10-CM | POA: Diagnosis not present

## 2016-04-21 DIAGNOSIS — I714 Abdominal aortic aneurysm, without rupture, unspecified: Secondary | ICD-10-CM | POA: Diagnosis present

## 2016-04-21 DIAGNOSIS — E1165 Type 2 diabetes mellitus with hyperglycemia: Secondary | ICD-10-CM | POA: Diagnosis present

## 2016-04-21 DIAGNOSIS — E118 Type 2 diabetes mellitus with unspecified complications: Secondary | ICD-10-CM | POA: Diagnosis not present

## 2016-04-21 DIAGNOSIS — I248 Other forms of acute ischemic heart disease: Secondary | ICD-10-CM | POA: Diagnosis not present

## 2016-04-21 DIAGNOSIS — I5033 Acute on chronic diastolic (congestive) heart failure: Secondary | ICD-10-CM | POA: Diagnosis not present

## 2016-04-21 DIAGNOSIS — N183 Chronic kidney disease, stage 3 unspecified: Secondary | ICD-10-CM | POA: Diagnosis present

## 2016-04-21 DIAGNOSIS — E876 Hypokalemia: Secondary | ICD-10-CM | POA: Diagnosis not present

## 2016-04-21 DIAGNOSIS — Z7901 Long term (current) use of anticoagulants: Secondary | ICD-10-CM

## 2016-04-21 DIAGNOSIS — Z7984 Long term (current) use of oral hypoglycemic drugs: Secondary | ICD-10-CM

## 2016-04-21 DIAGNOSIS — R0602 Shortness of breath: Secondary | ICD-10-CM | POA: Diagnosis not present

## 2016-04-21 DIAGNOSIS — J449 Chronic obstructive pulmonary disease, unspecified: Secondary | ICD-10-CM | POA: Diagnosis present

## 2016-04-21 DIAGNOSIS — Z7902 Long term (current) use of antithrombotics/antiplatelets: Secondary | ICD-10-CM | POA: Diagnosis not present

## 2016-04-21 DIAGNOSIS — Z955 Presence of coronary angioplasty implant and graft: Secondary | ICD-10-CM

## 2016-04-21 DIAGNOSIS — J9601 Acute respiratory failure with hypoxia: Secondary | ICD-10-CM | POA: Diagnosis not present

## 2016-04-21 DIAGNOSIS — Z79899 Other long term (current) drug therapy: Secondary | ICD-10-CM

## 2016-04-21 DIAGNOSIS — J189 Pneumonia, unspecified organism: Secondary | ICD-10-CM | POA: Diagnosis present

## 2016-04-21 DIAGNOSIS — R7989 Other specified abnormal findings of blood chemistry: Secondary | ICD-10-CM | POA: Diagnosis present

## 2016-04-21 DIAGNOSIS — I252 Old myocardial infarction: Secondary | ICD-10-CM

## 2016-04-21 DIAGNOSIS — J9811 Atelectasis: Secondary | ICD-10-CM | POA: Diagnosis not present

## 2016-04-21 DIAGNOSIS — J440 Chronic obstructive pulmonary disease with acute lower respiratory infection: Secondary | ICD-10-CM | POA: Diagnosis present

## 2016-04-21 DIAGNOSIS — F17210 Nicotine dependence, cigarettes, uncomplicated: Secondary | ICD-10-CM | POA: Diagnosis present

## 2016-04-21 DIAGNOSIS — I10 Essential (primary) hypertension: Secondary | ICD-10-CM | POA: Diagnosis present

## 2016-04-21 DIAGNOSIS — R748 Abnormal levels of other serum enzymes: Secondary | ICD-10-CM | POA: Diagnosis not present

## 2016-04-21 DIAGNOSIS — J410 Simple chronic bronchitis: Secondary | ICD-10-CM | POA: Diagnosis not present

## 2016-04-21 DIAGNOSIS — R000 Tachycardia, unspecified: Secondary | ICD-10-CM | POA: Diagnosis not present

## 2016-04-21 DIAGNOSIS — R778 Other specified abnormalities of plasma proteins: Secondary | ICD-10-CM | POA: Diagnosis present

## 2016-04-21 DIAGNOSIS — J441 Chronic obstructive pulmonary disease with (acute) exacerbation: Secondary | ICD-10-CM | POA: Diagnosis present

## 2016-04-21 LAB — CBC
HCT: 40.1 % (ref 39.0–52.0)
Hemoglobin: 13.3 g/dL (ref 13.0–17.0)
MCH: 28 pg (ref 26.0–34.0)
MCHC: 33.2 g/dL (ref 30.0–36.0)
MCV: 84.4 fL (ref 78.0–100.0)
PLATELETS: 160 10*3/uL (ref 150–400)
RBC: 4.75 MIL/uL (ref 4.22–5.81)
RDW: 15.7 % — AB (ref 11.5–15.5)
WBC: 14.9 10*3/uL — AB (ref 4.0–10.5)

## 2016-04-21 LAB — BASIC METABOLIC PANEL
Anion gap: 13 (ref 5–15)
BUN: 19 mg/dL (ref 6–20)
CALCIUM: 9.8 mg/dL (ref 8.9–10.3)
CO2: 26 mmol/L (ref 22–32)
CREATININE: 1.49 mg/dL — AB (ref 0.61–1.24)
Chloride: 100 mmol/L — ABNORMAL LOW (ref 101–111)
GFR, EST AFRICAN AMERICAN: 51 mL/min — AB (ref >60)
GFR, EST NON AFRICAN AMERICAN: 44 mL/min — AB (ref >60)
Glucose, Bld: 210 mg/dL — ABNORMAL HIGH (ref 65–99)
Potassium: 3.2 mmol/L — ABNORMAL LOW (ref 3.5–5.1)
SODIUM: 139 mmol/L (ref 135–145)

## 2016-04-21 LAB — I-STAT TROPONIN, ED: TROPONIN I, POC: 0 ng/mL (ref 0.00–0.08)

## 2016-04-21 MED ORDER — METOPROLOL TARTRATE 5 MG/5ML IV SOLN
5.0000 mg | INTRAVENOUS | Status: AC | PRN
  Administered 2016-04-21 – 2016-04-22 (×3): 5 mg via INTRAVENOUS
  Filled 2016-04-21 (×3): qty 5

## 2016-04-21 MED ORDER — METOPROLOL TARTRATE 25 MG PO TABS
25.0000 mg | ORAL_TABLET | Freq: Once | ORAL | Status: AC
  Administered 2016-04-21: 25 mg via ORAL
  Filled 2016-04-21: qty 1

## 2016-04-21 NOTE — ED Provider Notes (Signed)
[facility] DEPT Provider Note   CSN: [id number] Arrival date & time: 04/21/16  2300     History   Chief Complaint Chief Complaint  Patient presents with  . Chest Pain  . Shortness of Breath    HPI Jason Fisher is a 76 y.o. male with a history of chronic atrial fibrillation, history of CAD status post PCI, followed by Dr. Martinique, who presents emergency Department with chief complaint of palpitations and shortness of breath. Patient states that over the past 2 days. She's had intermittent bouts of self resolving, shortness of breath, racing heart and palpitations. He states that his chronic A. fib but denies a previous history of RVR. The patient came in because around 5 PM today. He went into a rapid rate and has been persistently short of breath. Patient did take a nitroglycerin earlier. He denies chest pain. Denies fevers, chills, myalgias, arthralgias. Denies chest tightness or pressure, radiation to left arm, jaw or back, or diaphoresis. Denies dysuria, flank pain, suprapubic pain, frequency, urgency, or hematuria. Denies headaches, light headedness, weakness, visual disturbances. Denies abdominal pain, nausea, vomiting, diarrhea or constipation.  HPI  Past Medical History:  Diagnosis Date  . 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 BENIGN PROSTATIC HYPERTROPHY, HX OF 02/24/2007  . CAD S/P PCI    a. 2007 Inf STEMI s/p Vision BMS  2 to RCA;  b. 2009 ISR Prox RCA Stent-->with DES;  c. 09/2015 PCI: LM nl, LAD 30p/m, D1 90, LCX nl, OM1 small, 80, OM2 90 (2.5x14 Biofreedom stent), OM3 small, RCA 30p ISR, 32m ISR, 7m, 85d, EF 50-55%.  . 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 Chronic atrial fibrillation ([place]) 11/06/2009   a. On Eliquis (CHA2DS2VASc = 5).  Rate controlled w/ BB and CCB.  Marland Kitchen Chronic diastolic CHF (congestive heart failure) ([place])    a. 09/2015 Echo: EF 55-60%, mildly dil LA.  . CKD (chronic kidney disease), stage III   . COLONIC POLYPS, HX OF 11/02/2006  . DIABETES  MELLITUS, TYPE II 10/29/2006  . Diverticulosis   . History of tobacco abuse    a. Quit 09/2015.  Marland Kitchen HYPERLIPIDEMIA 10/29/2006  . Hypertensive heart disease with heart failure [facility])     Patient Active Problem List   Diagnosis Date Noted  . COPD (chronic obstructive pulmonary disease) ([place]) 02/07/2016  . Abdominal aortic aneurysm (AAA) without rupture ([place])   . Perirectal abscess 11/04/2015  . Diabetes mellitus with complication ([place]) 22/97/9892  . Pulmonary nodules 11/04/2015  . Coronary artery disease of bypass graft of native heart with stable angina pectoris ([place])   . HCAP (healthcare-associated pneumonia) 09/29/2015  . Allergic reaction to contrast dye 09/27/2015  . Accelerated hypertension with diastolic congestive heart failure, NYHA class 3 ([place]) 09/27/2015  . Acute respiratory failure with hypoxia ([place]) 09/27/2015  . Unstable angina ([place]) 09/26/2015  . CHF (congestive heart failure) ([place]) 09/25/2015  . Progressive angina ([place]) 09/25/2015  . CKD (chronic kidney disease), stage III   . Acute on chronic combined systolic and diastolic congestive heart failure ([place])   . Diastolic congestive heart failure ([place])   . Paroxysmal atrial fibrillation (HCC)   . Angiodysplasia of colon 04/19/2015  . TOBACCO USER 04/17/2010  . Chronic atrial fibrillation ([place]): CHA2DS2Vasc = 5. On Warfarin. Rate control 11/06/2009  . Occlusion and stenosis of carotid artery without mention of cerebral infarction 07/13/2008  . Abdominal aortic aneurysm ([place]) 07/13/2008  . BENIGN PROSTATIC HYPERTROPHY, HX OF 02/24/2007  . MYOCARDIAL INFARCTION, HX OF 11/02/2006  .  CAD S/P percutaneous coronary angioplasty 11/02/2006  . History of colonic polyps 11/02/2006  . DM (diabetes mellitus), type 2 with peripheral vascular complications ([place]) 47/42/5956  . Dyslipidemia 10/29/2006  . Essential hypertension 10/29/2006    Past Surgical History:  Procedure Laterality Date  . CARDIAC CATHETERIZATION N/A 09/26/2015    Procedure: Left Heart Cath and Coronary Angiography;  Surgeon: Peter M Martinique, MD;  Location: [facility] CV [facility];  Service: Cardiovascular;  Laterality: N/A;  . CARDIAC CATHETERIZATION N/A 09/26/2015   Procedure: Coronary Stent Intervention;  Surgeon: Peter M Martinique, MD;  Location: [facility];  Service: Cardiovascular;  Laterality: N/A;  . CATARACT EXTRACTION    . COLONOSCOPY    . CORONARY ANGIOPLASTY WITH STENT PLACEMENT  2007   Inferior STEMI 2007 - RCA PCI with 2 Vision BMS; Abnormal Myoview in 2009 - pRCA ins-stent restenosis & unstented disease - DES PCI Promus 3.0 x 20 mm (3.5 mm)  . INCISION AND DRAINAGE PERIRECTAL ABSCESS N/A 11/06/2015   Procedure: IRRIGATION AND DEBRIDEMENT PERIRECTAL ABSCESS, POSSIBLE HEMORRHOIDECTOMY;  Surgeon: Coralie Keens, MD;  Location: [facility];  Service: General;  Laterality: N/A;  . SHOULDER SURGERY Right        Home Medications    Prior to Admission medications   Medication Sig Start Date End Date Taking? Authorizing Provider  ACCU-CHEK AVIVA PLUS test strip CHECK BLOOD SUGAR ONCE DAILY AS NEEDED 03/27/15   Marletta Lor, MD  atorvastatin (LIPITOR) 20 MG tablet TAKE 1 TABLET BY MOUTH EVERY DAY 11/11/15   Marletta Lor, MD  clopidogrel (PLAVIX) 75 MG tablet TAKE 1 TABLET BY MOUTH EVERY DAY 09/23/15   Marletta Lor, MD  ELIQUIS 5 MG TABS tablet TAKE 1 TABLET (5 MG TOTAL) BY MOUTH 2 (TWO) TIMES DAILY. 03/03/16   Marletta Lor, MD  furosemide (LASIX) 40 MG tablet Take 2 tablets (80 mg total) by mouth daily. 02/12/16   Marletta Lor, MD  glimepiride (AMARYL) 4 MG tablet Take 0.5 tablets (2 mg total) by mouth daily with breakfast. 10/15/15   Marletta Lor, MD  hydrALAZINE (APRESOLINE) 50 MG tablet Take 1 tablet (50 mg total) by mouth 3 (three) times daily. 12/12/15 03/11/16  Peter M Martinique, MD  Insulin Pen Needle 29G X 12.7MM MISC 1 each by Does not apply route 1 day or 1 dose. 01/06/12   Marletta Lor, MD  Lancets  (ACCU-CHEK MULTICLIX) lancets Once daily 250.00 01/09/13   Marletta Lor, MD  levalbuterol [facility]) 45 MCG/ACT inhaler Inhale 1-2 puffs into the lungs every 6 (six) hours as needed for wheezing. 03/09/16   Magdalen Spatz, NP  Liraglutide (VICTOZA) 18 MG/3ML SOPN Inject 1.8 mg into the skin daily. Takes 0.90ml's    Historical Provider, MD  metFORMIN (GLUCOPHAGE) 1000 MG tablet TAKE 1 TABLET TWICE DAILY WITH A MEAL 07/27/14   Marletta Lor, MD  metoprolol (LOPRESSOR) 100 MG tablet Take 1 tablet (100 mg total) by mouth 2 (two) times daily. 03/07/14   Marletta Lor, MD  metoprolol (LOPRESSOR) 100 MG tablet TAKE 1 TABLET TWICE DAILY 02/13/16   Marletta Lor, MD  NITROSTAT 0.4 MG SL tablet PLACE 1 TABLET UNDER TONGUE EVERY 5 MINUTES AS NEEDED 11/29/13   Marletta Lor, MD  oxyCODONE (OXY IR/ROXICODONE) 5 MG immediate release tablet Take 1 tablet (5 mg total) by mouth every 6 (six) hours as needed for moderate pain or severe pain. 11/08/15   Domenic Polite, MD  polyethylene  glycol powder (GLYCOLAX/MIRALAX) powder Take 17 g by mouth 2 (two) times daily as needed. 09/06/15   Marletta Lor, MD  Tiotropium Bromide-Olodaterol (STIOLTO RESPIMAT) 2.5-2.5 MCG/ACT AERS Inhale 2 puffs into the lungs daily.    Historical Provider, MD  Tiotropium Bromide-Olodaterol (STIOLTO RESPIMAT) 2.5-2.5 MCG/ACT AERS Inhale 2 puffs into the lungs daily. 03/09/16   Magdalen Spatz, NP  VICTOZA 18 MG/3ML SOPN INJECT 0.3MLS INTO SKIN DAILY 04/17/16   Marletta Lor, MD    Family History Family History  Problem Relation Age of Onset  . Heart attack Mother   . Diabetes Mother   . Heart attack Father   . Hypertension Father   . Heart attack Brother   . Diabetes Brother     Social History Social History  Substance Use Topics  . Smoking status: Former Smoker    Types: Cigarettes    Quit date: 09/08/2015  . Smokeless tobacco: Never Used     Comment: passive smoker as well  . Alcohol use No      Allergies   Contrast media [iodinated diagnostic agents]   Review of Systems Review of Systems  Ten systems reviewed and are negative for acute change, except as noted in the HPI.   Physical Exam Updated Vital Signs BP (!) 183/118 (BP Location: Left Arm)   Pulse (!) 142   Temp 99.4 F (37.4 C) (Oral)   Resp 14   Wt 86.2 kg   SpO2 95%   BMI 28.06 kg/m   Physical Exam  Constitutional: He appears well-developed and well-nourished. No distress.  HENT:  Head: Normocephalic and atraumatic.  Eyes: Conjunctivae are normal. No scleral icterus.  Neck: Normal range of motion. Neck supple.  Cardiovascular: Normal rate, regular rhythm and normal heart sounds.   Pulmonary/Chest: Effort normal and breath sounds normal. No respiratory distress.  Abdominal: Soft. Bowel sounds are normal. He exhibits no distension. There is no tenderness.  Musculoskeletal: He exhibits no edema.  Neurological: He is alert.  Skin: Skin is warm and dry. He is not diaphoretic.  Psychiatric: His behavior is normal.  Nursing note and vitals reviewed.    ED Treatments / Results  Labs (all labs ordered are listed, but only abnormal results are displayed) Labs Reviewed  CBC - Abnormal; Notable for the following:       Result Value   WBC 14.9 (*)    RDW 15.7 (*)    All other components within normal limits  BASIC METABOLIC PANEL  I-STAT TROPOININ, ED    EKG  EKG Interpretation None       Radiology No results found.  Procedures .Critical Care Performed by: Margarita Mail Authorized by: Margarita Mail   Critical care provider statement:    Critical care time (minutes):  45   Critical care time was exclusive of:  Separately billable procedures and treating other patients   Critical care was necessary to treat or prevent imminent or life-threatening deterioration of the following conditions:  Cardiac failure (intractable Afib with RVR)   Critical care was time spent personally by me on  the following activities:  Development of treatment plan with patient or surrogate, discussions with consultants, evaluation of patient's response to treatment, examination of patient, interpretation of cardiac output measurements, obtaining history from patient or surrogate, ordering and review of radiographic studies, ordering and review of laboratory studies, pulse oximetry, re-evaluation of patient's condition, review of old charts and ordering and performing treatments and interventions   (including critical care time)  Medications Ordered in ED Medications  metoprolol tartrate (LOPRESSOR) tablet 25 mg (not administered)  metoprolol (LOPRESSOR) injection 5 mg (not administered)     Initial Impression / Assessment and Plan / ED Course  I have reviewed the triage vital signs and the nursing notes.  Pertinent labs & imaging results that were available during my care of the patient were reviewed by me and considered in my medical decision making (see chart for details).  Clinical Course as of Apr 28 2330  Wed Apr 22, 2016  0256 Patient currently rate controlled on a diltiazem infusion at 7.5 mg per hour. Also got a loading dose. The nurse tells me that she has tried adjusting his drip down and as soon as she goes below 7.5 mg he rebounds into RVR. Given the multitude of medical interventions. We have given him the patient will likely need admission. His urine is still pending. However, he is not complaining of urinary symptoms and is afebrile.  [AH]    Clinical Course User Index [AH] Margarita Mail, PA-C     patient admitted to step down for afib with RVR.  On diltiazem drip.   Final Clinical Impressions(s) / ED Diagnoses   Final diagnoses:  None    New Prescriptions New Prescriptions   No medications on file     Margarita Mail, PA-C 04/27/16 [place], MD 04/29/16 [phone]

## 2016-04-21 NOTE — ED Triage Notes (Signed)
Pt presents with midsternal CP and SOB that began this evening; pt states took one SL NTG PTA; pt also reports hx of anurysm; HR 160 in triage

## 2016-04-21 NOTE — ED Notes (Signed)
Abigail PA at bedside   

## 2016-04-22 DIAGNOSIS — F17210 Nicotine dependence, cigarettes, uncomplicated: Secondary | ICD-10-CM | POA: Diagnosis present

## 2016-04-22 DIAGNOSIS — Z955 Presence of coronary angioplasty implant and graft: Secondary | ICD-10-CM | POA: Diagnosis not present

## 2016-04-22 DIAGNOSIS — N183 Chronic kidney disease, stage 3 (moderate): Secondary | ICD-10-CM | POA: Diagnosis not present

## 2016-04-22 DIAGNOSIS — J410 Simple chronic bronchitis: Secondary | ICD-10-CM

## 2016-04-22 DIAGNOSIS — I482 Chronic atrial fibrillation: Secondary | ICD-10-CM | POA: Diagnosis present

## 2016-04-22 DIAGNOSIS — Z8249 Family history of ischemic heart disease and other diseases of the circulatory system: Secondary | ICD-10-CM | POA: Diagnosis not present

## 2016-04-22 DIAGNOSIS — I2510 Atherosclerotic heart disease of native coronary artery without angina pectoris: Secondary | ICD-10-CM | POA: Diagnosis not present

## 2016-04-22 DIAGNOSIS — I4891 Unspecified atrial fibrillation: Secondary | ICD-10-CM | POA: Diagnosis present

## 2016-04-22 DIAGNOSIS — Z9861 Coronary angioplasty status: Secondary | ICD-10-CM | POA: Diagnosis not present

## 2016-04-22 DIAGNOSIS — I714 Abdominal aortic aneurysm, without rupture: Secondary | ICD-10-CM | POA: Diagnosis not present

## 2016-04-22 DIAGNOSIS — Z7902 Long term (current) use of antithrombotics/antiplatelets: Secondary | ICD-10-CM | POA: Diagnosis not present

## 2016-04-22 DIAGNOSIS — I248 Other forms of acute ischemic heart disease: Secondary | ICD-10-CM | POA: Diagnosis present

## 2016-04-22 DIAGNOSIS — N400 Enlarged prostate without lower urinary tract symptoms: Secondary | ICD-10-CM | POA: Diagnosis present

## 2016-04-22 DIAGNOSIS — J189 Pneumonia, unspecified organism: Secondary | ICD-10-CM | POA: Diagnosis present

## 2016-04-22 DIAGNOSIS — I252 Old myocardial infarction: Secondary | ICD-10-CM | POA: Diagnosis not present

## 2016-04-22 DIAGNOSIS — R079 Chest pain, unspecified: Secondary | ICD-10-CM | POA: Diagnosis present

## 2016-04-22 DIAGNOSIS — E785 Hyperlipidemia, unspecified: Secondary | ICD-10-CM | POA: Diagnosis present

## 2016-04-22 DIAGNOSIS — J9601 Acute respiratory failure with hypoxia: Secondary | ICD-10-CM

## 2016-04-22 DIAGNOSIS — I130 Hypertensive heart and chronic kidney disease with heart failure and stage 1 through stage 4 chronic kidney disease, or unspecified chronic kidney disease: Secondary | ICD-10-CM | POA: Diagnosis present

## 2016-04-22 DIAGNOSIS — R748 Abnormal levels of other serum enzymes: Secondary | ICD-10-CM

## 2016-04-22 DIAGNOSIS — J440 Chronic obstructive pulmonary disease with acute lower respiratory infection: Secondary | ICD-10-CM | POA: Diagnosis present

## 2016-04-22 DIAGNOSIS — Z7901 Long term (current) use of anticoagulants: Secondary | ICD-10-CM | POA: Diagnosis not present

## 2016-04-22 DIAGNOSIS — R778 Other specified abnormalities of plasma proteins: Secondary | ICD-10-CM | POA: Diagnosis present

## 2016-04-22 DIAGNOSIS — R7989 Other specified abnormal findings of blood chemistry: Secondary | ICD-10-CM

## 2016-04-22 DIAGNOSIS — E1165 Type 2 diabetes mellitus with hyperglycemia: Secondary | ICD-10-CM | POA: Diagnosis present

## 2016-04-22 DIAGNOSIS — E876 Hypokalemia: Secondary | ICD-10-CM | POA: Diagnosis present

## 2016-04-22 DIAGNOSIS — I5033 Acute on chronic diastolic (congestive) heart failure: Secondary | ICD-10-CM | POA: Diagnosis not present

## 2016-04-22 DIAGNOSIS — E1122 Type 2 diabetes mellitus with diabetic chronic kidney disease: Secondary | ICD-10-CM | POA: Diagnosis present

## 2016-04-22 DIAGNOSIS — J9811 Atelectasis: Secondary | ICD-10-CM | POA: Diagnosis present

## 2016-04-22 DIAGNOSIS — E118 Type 2 diabetes mellitus with unspecified complications: Secondary | ICD-10-CM | POA: Diagnosis not present

## 2016-04-22 LAB — RESPIRATORY PANEL BY PCR
ADENOVIRUS-RVPPCR: NOT DETECTED
Bordetella pertussis: NOT DETECTED
CHLAMYDOPHILA PNEUMONIAE-RVPPCR: NOT DETECTED
CORONAVIRUS HKU1-RVPPCR: NOT DETECTED
CORONAVIRUS NL63-RVPPCR: NOT DETECTED
CORONAVIRUS OC43-RVPPCR: NOT DETECTED
Coronavirus 229E: NOT DETECTED
INFLUENZA A-RVPPCR: NOT DETECTED
Influenza B: NOT DETECTED
MYCOPLASMA PNEUMONIAE-RVPPCR: NOT DETECTED
Metapneumovirus: NOT DETECTED
PARAINFLUENZA VIRUS 1-RVPPCR: NOT DETECTED
PARAINFLUENZA VIRUS 4-RVPPCR: NOT DETECTED
Parainfluenza Virus 2: NOT DETECTED
Parainfluenza Virus 3: NOT DETECTED
Respiratory Syncytial Virus: NOT DETECTED
Rhinovirus / Enterovirus: NOT DETECTED

## 2016-04-22 LAB — URINALYSIS, ROUTINE W REFLEX MICROSCOPIC
BILIRUBIN URINE: NEGATIVE
GLUCOSE, UA: 50 mg/dL — AB
HGB URINE DIPSTICK: NEGATIVE
Ketones, ur: NEGATIVE mg/dL
Leukocytes, UA: NEGATIVE
NITRITE: NEGATIVE
PROTEIN: 100 mg/dL — AB
SPECIFIC GRAVITY, URINE: 1.025 (ref 1.005–1.030)
Squamous Epithelial / LPF: NONE SEEN
pH: 5 (ref 5.0–8.0)

## 2016-04-22 LAB — PROCALCITONIN: Procalcitonin: 0.7 ng/mL

## 2016-04-22 LAB — RAPID URINE DRUG SCREEN, HOSP PERFORMED
AMPHETAMINES: NOT DETECTED
BENZODIAZEPINES: NOT DETECTED
Barbiturates: NOT DETECTED
Cocaine: NOT DETECTED
OPIATES: POSITIVE — AB
TETRAHYDROCANNABINOL: NOT DETECTED

## 2016-04-22 LAB — MRSA PCR SCREENING: MRSA by PCR: NEGATIVE

## 2016-04-22 LAB — TROPONIN I
Troponin I: 0.04 ng/mL (ref <0.03)
Troponin I: 0.03 ng/mL (ref <0.03)
Troponin I: <0.03 ng/mL (ref <0.03)

## 2016-04-22 LAB — EXPECTORATED SPUTUM ASSESSMENT W GRAM STAIN, RFLX TO RESP C

## 2016-04-22 LAB — GLUCOSE, CAPILLARY
GLUCOSE-CAPILLARY: 243 mg/dL — AB (ref 65–99)
Glucose-Capillary: 288 mg/dL — ABNORMAL HIGH (ref 65–99)
Glucose-Capillary: 259 mg/dL — ABNORMAL HIGH (ref 65–99)

## 2016-04-22 LAB — STREP PNEUMONIAE URINARY ANTIGEN: STREP PNEUMO URINARY ANTIGEN: NEGATIVE

## 2016-04-22 LAB — EXPECTORATED SPUTUM ASSESSMENT W REFEX TO RESP CULTURE

## 2016-04-22 LAB — BRAIN NATRIURETIC PEPTIDE: B NATRIURETIC PEPTIDE 5: 232.6 pg/mL — AB (ref 0.0–100.0)

## 2016-04-22 LAB — TSH: TSH: 0.424 u[IU]/mL (ref 0.350–4.500)

## 2016-04-22 MED ORDER — ENOXAPARIN SODIUM 40 MG/0.4ML ~~LOC~~ SOLN: 40.0000 mg | SUBCUTANEOUS | Status: DC

## 2016-04-22 MED ORDER — DEXTROSE 5 % IV SOLN
1.0000 g | INTRAVENOUS | Status: DC
  Administered 2016-04-22 – 2016-04-24 (×3): 1 g via INTRAVENOUS
  Filled 2016-04-22 (×3): qty 10

## 2016-04-22 MED ORDER — ATORVASTATIN CALCIUM 20 MG PO TABS
20.0000 mg | ORAL_TABLET | Freq: Every day | ORAL | Status: DC
  Administered 2016-04-22 – 2016-04-23 (×2): 20 mg via ORAL
  Filled 2016-04-22 (×2): qty 1

## 2016-04-22 MED ORDER — DILTIAZEM HCL-DEXTROSE 100-5 MG/100ML-% IV SOLN (PREMIX)
5.0000 mg/h | INTRAVENOUS | Status: DC
Start: 2016-04-22 — End: 2016-04-22
  Administered 2016-04-22: 5 mg/h via INTRAVENOUS
  Filled 2016-04-22: qty 100

## 2016-04-22 MED ORDER — INSULIN ASPART 100 UNIT/ML ~~LOC~~ SOLN
0.0000 [IU] | Freq: Three times a day (TID) | SUBCUTANEOUS | Status: DC
  Administered 2016-04-22 (×2): 5 [IU] via SUBCUTANEOUS
  Administered 2016-04-23: 3 [IU] via SUBCUTANEOUS
  Administered 2016-04-23: 8 [IU] via SUBCUTANEOUS

## 2016-04-22 MED ORDER — NITROGLYCERIN 0.4 MG SL SUBL: 0.4000 mg | SUBLINGUAL_TABLET | SUBLINGUAL | Status: DC | PRN

## 2016-04-22 MED ORDER — OXYCODONE HCL 5 MG PO TABS: 5.0000 mg | ORAL_TABLET | Freq: Four times a day (QID) | ORAL | Status: DC | PRN

## 2016-04-22 MED ORDER — METOPROLOL TARTRATE 100 MG PO TABS
100.0000 mg | ORAL_TABLET | Freq: Two times a day (BID) | ORAL | Status: DC
  Administered 2016-04-22 – 2016-04-24 (×5): 100 mg via ORAL
  Filled 2016-04-22: qty 2
  Filled 2016-04-22 (×2): qty 1
  Filled 2016-04-22 (×2): qty 2

## 2016-04-22 MED ORDER — CLOPIDOGREL BISULFATE 75 MG PO TABS
75.0000 mg | ORAL_TABLET | Freq: Every day | ORAL | Status: DC
  Administered 2016-04-22 – 2016-04-24 (×3): 75 mg via ORAL
  Filled 2016-04-22 (×3): qty 1

## 2016-04-22 MED ORDER — ACETAMINOPHEN 325 MG PO TABS: 650.0000 mg | ORAL_TABLET | ORAL | Status: DC | PRN

## 2016-04-22 MED ORDER — POTASSIUM CHLORIDE 20 MEQ/15ML (10%) PO SOLN
40.0000 meq | Freq: Once | ORAL | Status: AC
  Administered 2016-04-22: 40 meq via ORAL
  Filled 2016-04-22: qty 30

## 2016-04-22 MED ORDER — INSULIN ASPART 100 UNIT/ML ~~LOC~~ SOLN
0.0000 [IU] | Freq: Every day | SUBCUTANEOUS | Status: DC
  Administered 2016-04-22: 3 [IU] via SUBCUTANEOUS
  Administered 2016-04-23: 5 [IU] via SUBCUTANEOUS

## 2016-04-22 MED ORDER — DILTIAZEM HCL 25 MG/5ML IV SOLN: 10.0000 mg | Freq: Once | INTRAVENOUS | Status: DC

## 2016-04-22 MED ORDER — FUROSEMIDE 80 MG PO TABS: 80.0000 mg | ORAL_TABLET | Freq: Every day | ORAL | Status: DC

## 2016-04-22 MED ORDER — ONDANSETRON HCL 4 MG/2ML IJ SOLN: 4.0000 mg | Freq: Four times a day (QID) | INTRAMUSCULAR | Status: DC | PRN

## 2016-04-22 MED ORDER — MORPHINE SULFATE (PF) 4 MG/ML IV SOLN: 2.0000 mg | INTRAVENOUS | Status: DC | PRN

## 2016-04-22 MED ORDER — FUROSEMIDE 10 MG/ML IJ SOLN
80.0000 mg | Freq: Two times a day (BID) | INTRAMUSCULAR | Status: DC
  Administered 2016-04-22 – 2016-04-24 (×4): 80 mg via INTRAVENOUS
  Filled 2016-04-22 (×4): qty 8

## 2016-04-22 MED ORDER — APIXABAN 5 MG PO TABS
5.0000 mg | ORAL_TABLET | Freq: Two times a day (BID) | ORAL | Status: DC
  Administered 2016-04-22 – 2016-04-24 (×5): 5 mg via ORAL
  Filled 2016-04-22 (×5): qty 1

## 2016-04-22 MED ORDER — DILTIAZEM HCL-DEXTROSE 100-5 MG/100ML-% IV SOLN (PREMIX)
5.0000 mg/h | INTRAVENOUS | Status: DC
  Administered 2016-04-22 (×2): 10 mg/h via INTRAVENOUS
  Filled 2016-04-22 (×3): qty 100

## 2016-04-22 MED ORDER — AZITHROMYCIN 500 MG PO TABS
500.0000 mg | ORAL_TABLET | ORAL | Status: DC
  Administered 2016-04-22 – 2016-04-24 (×3): 500 mg via ORAL
  Filled 2016-04-22 (×4): qty 1

## 2016-04-22 MED ORDER — DILTIAZEM LOAD VIA INFUSION
10.0000 mg | Freq: Once | INTRAVENOUS | Status: AC
  Administered 2016-04-22: 10 mg via INTRAVENOUS
  Filled 2016-04-22: qty 10

## 2016-04-22 NOTE — Progress Notes (Signed)
Inpatient Diabetes Program Recommendations  AACE/ADA: New Consensus Statement on Inpatient Glycemic Control (2015)  Target Ranges:  Prepandial:   less than 140 mg/dL      Peak postprandial:   less than 180 mg/dL (1-2 hours)      Critically ill patients:  140 - 180 mg/dL   Lab Results  Component Value Date   SRPRXY 585 (H) 04/22/2016   HGBA1C 7.0 (H) 02/05/2016    Review of Glycemic Control  Diabetes history: DM2 Outpatient Diabetes medications: Victoza .3 ml QD, metformin 1000 mg bid, amaryl 2 mg QAM Current orders for Inpatient glycemic control: Novolog 0-15 units tidwc and hs HgbA1C indicative of good glycemic control at home. Blood sugars > 180 mg/dL in hospital. May benefit from basal insulin.  Inpatient Diabetes Program Recommendations:     Blood sugars remain elevated.  Please consider starting low dose basal insulin, Lantus 10 units day starting now.   Will continue to follow. Thank you. Lorenda Peck, RD, LDN, CDE Inpatient Diabetes Coordinator [phone]

## 2016-04-22 NOTE — ED Provider Notes (Signed)
Patient presented to the ER with chest pain and shortness of breath. Patient has been having intermittent episodes this week, but this particular episode began this evening and has been persistent. Patient does have a history of atrial fibrillation.  Face to face Exam: HEENT - PERRLA Lungs - CTAB Heart - irregularly irregular, tachycardic, no M/R/G Abd - S/NT/ND Neuro - alert, oriented x3  Plan: Patient's records indicate that he is in chronic atrial fibrillation, presents with chest pain and shortness of breath with rapid ventricular response. He did not respond to IV and oral Lopressor, which is what he is normally prescribed as an outpatient. Will require diltiazem drip and hospitalization for rate control. Patient is anticoagulated with Eliquis.  CHA2DS2/VAS Stroke Risk Points      6 >= 2 Points: High Risk  1 - 1.99 Points: Medium Risk  0 Points: Low Risk    The previous score was 4 on 09/25/2015.:  Change:         Details    Note: External data might be a factor in metrics not marked with    Points Metrics   This score determines the patient's risk of having a stroke if the  patient has atrial fibrillation.       1 Has Congestive Heart Failure:  Yes   1 Has Vascular Disease:  Yes   1 Has Hypertension:  Yes   2 Age:  76   1 Has Diabetes:  Yes   0 Had Stroke:  No Had TIA:  No Had thromboembolism:  No   0 Male:  No           Orpah Greek, MD 04/22/16 [phone]

## 2016-04-22 NOTE — Progress Notes (Signed)
Dyanne Carrel, NP notified of PCT .7 by text message.

## 2016-04-22 NOTE — H&P (Signed)
History and Physical    OM LIZOTTE FKC:[id number] DOB: [birth year] DOA: 04/21/2016  PCP: Nyoka Cowden, MD Patient coming from: home  Chief Complaint: palpitation Jason Fisher  HPI: Jason Fisher is a pleasant 76 y.o. male with medical history significant for chronic A. fib, history of CAD status post PCI, diabetes, abdominal aortic aneurysm, hypertension, diastolic heart failure, COPD, chronic kidney disease stage III, dose emergency department with the chief complaint of chest pain and shortness of breath. Initial evaluation reveals Q respiratory failure with hypoxia likely related to A. fib with RVR.  Information is obtained from the patient and the chart. Reports over the last 2 days he experienced gradual worsening shortness of breath and intermittent palpitations. He reports having chronic A. fib a denies any previous history of palpitations. associated symptoms include intermittent dry cough. Reports taking a nitroglycerin with little relief. He denies chest pain headache dizziness syncope or near-syncope. He denies fever chills recent travel or sick contacts. He denies abdominal pain nausea vomiting or extremity edema. He denies dysuria hematuria frequency or urgency. He denies diarrhea constipation melena or bright red blood per rectum   ED Course: The emergency department is afebrile oxygen saturation level 89% on room air. His hypertensive with tachypnea and heart rate of 130. He is provided with Toprol IV Cardizem with little improvement. A Cardizem drip is initiated  Review of Systems: As per HPI otherwise 10 point review of systems negative.   Ambulatory Status: Relates independently  Past Medical History:  Diagnosis Date  . 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 BENIGN PROSTATIC HYPERTROPHY, HX OF 02/24/2007  . CAD S/P PCI    a. 2007 Inf STEMI s/p Vision BMS  2 to RCA;  b. 2009 ISR Prox RCA Stent-->with DES;  c. 09/2015 PCI: LM nl, LAD 30p/m, D1 90, LCX nl, OM1 small, 80,  OM2 90 (2.5x14 Biofreedom stent), OM3 small, RCA 30p ISR, 21m ISR, 37m, 85d, EF 50-55%.  . 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 Chronic atrial fibrillation ([place]) 11/06/2009   a. On Eliquis (CHA2DS2VASc = 5).  Rate controlled w/ BB and CCB.  Marland Kitchen Chronic diastolic CHF (congestive heart failure) ([place])    a. 09/2015 Echo: EF 55-60%, mildly dil LA.  . CKD (chronic kidney disease), stage III   . COLONIC POLYPS, HX OF 11/02/2006  . DIABETES MELLITUS, TYPE II 10/29/2006  . Diverticulosis   . History of tobacco abuse    a. Quit 09/2015.  Marland Kitchen HYPERLIPIDEMIA 10/29/2006  . Hypertensive heart disease with heart failure [facility])     Past Surgical History:  Procedure Laterality Date  . CARDIAC CATHETERIZATION N/A 09/26/2015   Procedure: Left Heart Cath and Coronary Angiography;  Surgeon: Peter M Martinique, MD;  Location: [facility];  Service: Cardiovascular;  Laterality: N/A;  . CARDIAC CATHETERIZATION N/A 09/26/2015   Procedure: Coronary Stent Intervention;  Surgeon: Peter M Martinique, MD;  Location: [facility] [facility];  Service: Cardiovascular;  Laterality: N/A;  . CATARACT EXTRACTION    . COLONOSCOPY    . CORONARY ANGIOPLASTY WITH STENT PLACEMENT  2007   Inferior STEMI 2007 - RCA PCI with 2 Vision BMS; Abnormal Myoview in 2009 - pRCA ins-stent restenosis & unstented disease - DES PCI Promus 3.0 x 20 mm (3.5 mm)  . INCISION AND DRAINAGE PERIRECTAL ABSCESS N/A 11/06/2015   Procedure: IRRIGATION AND DEBRIDEMENT PERIRECTAL ABSCESS, POSSIBLE HEMORRHOIDECTOMY;  Surgeon: Coralie Keens, MD;  Location: [place];  Service: General;  Laterality: N/A;  . SHOULDER SURGERY  Right     Social History   Social History  . Marital status: Married    Spouse name: N/A  . Number of children: 5  . Years of education: N/A   Occupational History  . [employer]    Social History Main Topics  . Smoking status: Former Smoker    Types: Cigarettes    Quit date: 09/08/2015  . Smokeless tobacco: Never Used     Comment:  passive smoker as well  . Alcohol use No  . Drug use: No  . Sexual activity: Not on file   Other Topics Concern  . Not on file   Social History Narrative  . No narrative on file  Home with his wife is independent with ADLs stop smoking 6 months ago.  Allergies  Allergen Reactions  . Contrast Media [Iodinated Diagnostic Agents] Anaphylaxis    Family History  Problem Relation Age of Onset  . Heart attack Mother   . Diabetes Mother   . Heart attack Father   . Hypertension Father   . Heart attack Brother   . Diabetes Brother     Prior to Admission medications   Medication Sig Start Date End Date Taking? Authorizing Provider  ACCU-CHEK AVIVA PLUS test strip CHECK BLOOD SUGAR ONCE DAILY AS NEEDED 03/27/15  Yes Marletta Lor, MD  atorvastatin (LIPITOR) 20 MG tablet TAKE 1 TABLET BY MOUTH EVERY DAY 11/11/15  Yes Marletta Lor, MD  clopidogrel (PLAVIX) 75 MG tablet TAKE 1 TABLET BY MOUTH EVERY DAY 09/23/15  Yes Marletta Lor, MD  diltiazem (DILTIAZEM CD) 120 MG 24 hr capsule Take 120 mg by mouth daily.   Yes Historical Provider, MD  ELIQUIS 5 MG TABS tablet TAKE 1 TABLET (5 MG TOTAL) BY MOUTH 2 (TWO) TIMES DAILY. 03/03/16  Yes Marletta Lor, MD  furosemide (LASIX) 40 MG tablet Take 2 tablets (80 mg total) by mouth daily. 02/12/16  Yes Marletta Lor, MD  glimepiride (AMARYL) 4 MG tablet Take 0.5 tablets (2 mg total) by mouth daily with breakfast. 10/15/15  Yes Marletta Lor, MD  Insulin Pen Needle 29G X 12.7MM MISC 1 each by Does not apply route 1 day or 1 dose. 01/06/12  Yes Marletta Lor, MD  Lancets (ACCU-CHEK MULTICLIX) lancets Once daily 250.00 01/09/13  Yes Marletta Lor, MD  levalbuterol [facility]) 45 MCG/ACT inhaler Inhale 1-2 puffs into the lungs every 6 (six) hours as needed for wheezing. 03/09/16  Yes Magdalen Spatz, NP  metFORMIN (GLUCOPHAGE) 1000 MG tablet TAKE 1 TABLET TWICE DAILY WITH A MEAL 07/27/14  Yes Marletta Lor, MD    metoprolol (LOPRESSOR) 100 MG tablet Take 1 tablet (100 mg total) by mouth 2 (two) times daily. 03/07/14  Yes Marletta Lor, MD  NITROSTAT 0.4 MG SL tablet PLACE 1 TABLET UNDER TONGUE EVERY 5 MINUTES AS NEEDED Patient taking differently: PLACE 1 TABLET UNDER TONGUE EVERY 5 MINUTES AS NEEDED FOR CHEST PAIN. 11/29/13  Yes Marletta Lor, MD  oxyCODONE (OXY IR/ROXICODONE) 5 MG immediate release tablet Take 1 tablet (5 mg total) by mouth every 6 (six) hours as needed for moderate pain or severe pain. 11/08/15  Yes Domenic Polite, MD  polyethylene glycol powder (GLYCOLAX/MIRALAX) powder Take 17 g by mouth 2 (two) times daily as needed. Patient taking differently: Take 17 g by mouth 2 (two) times daily as needed for mild constipation.  09/06/15  Yes Marletta Lor, MD  Tiotropium Bromide-Olodaterol (STIOLTO RESPIMAT)  2.5-2.5 MCG/ACT AERS Inhale 2 puffs into the lungs daily. 03/09/16  Yes Magdalen Spatz, NP  VICTOZA 18 MG/3ML SOPN INJECT 0.3MLS INTO SKIN DAILY 04/17/16  Yes Marletta Lor, MD    Physical Exam: Vitals:   04/22/16 0415 04/22/16 0445 04/22/16 0500 04/22/16 0700  BP: 121/72 120/86 (!) 145/68 129/88  Pulse: 93 93 93 82  Resp: (!) 22 (!) 26 20 19   Temp:   99 F (37.2 C) 98.9 F (37.2 C)  TempSrc:   Oral Tympanic  SpO2: 95% 97% 95% 95%  Weight:         General:  Appears calm and comfortable Eyes:  PERRL, EOMI, normal lids, iris ENT:  grossly normal hearing, lips & tongue, His membranes of his mouth are moist and pink Neck:  no LAD, masses or thyromegaly Cardiovascular:  Irregularly irregular, no m/r/g. No LE edema.  Respiratory:  CTA bilaterally, no w/r/r. Normal respiratory effort. Abdomen:  soft, ntnd, bowel sounds throughout no guarding or rebounding Skin:  no rash or induration seen on limited exam Musculoskeletal:  grossly normal tone BUE/BLE, good ROM, no bony abnormality Psychiatric:  grossly normal mood and affect, speech fluent and appropriate,  AOx3 Neurologic:  CN 2-12 grossly intact, moves all extremities in coordinated fashion, sensation intact  Labs on Admission: I have personally reviewed following labs and imaging studies  CBC:  Recent Labs Lab 04/21/16 2311  WBC 14.9*  HGB 13.3  HCT 40.1  MCV 84.4  PLT 716   Basic Metabolic Panel:  Recent Labs Lab 04/21/16 2311  NA 139  K 3.2*  CL 100*  CO2 26  GLUCOSE 210*  BUN 19  CREATININE 1.49*  CALCIUM 9.8   GFR: Estimated Creatinine Clearance: 46.6 mL/min (A) (by C-G formula based on SCr of 1.49 mg/dL (H)). Liver Function Tests: No results for input(s): AST, ALT, ALKPHOS, BILITOT, PROT, ALBUMIN in the last 168 hours. No results for input(s): LIPASE, AMYLASE in the last 168 hours. No results for input(s): AMMONIA in the last 168 hours. Coagulation Profile: No results for input(s): INR, PROTIME in the last 168 hours. Cardiac Enzymes:  Recent Labs Lab 04/22/16 0354  TROPONINI 0.04*   BNP (last 3 results)  Recent Labs  02/05/16 1206  PROBNP 289.0*   HbA1C: No results for input(s): HGBA1C in the last 72 hours. CBG: No results for input(s): GLUCAP in the last 168 hours. Lipid Profile: No results for input(s): CHOL, HDL, LDLCALC, TRIG, CHOLHDL, LDLDIRECT in the last 72 hours. Thyroid Function Tests:  Recent Labs  04/22/16 0355  TSH 0.424   Anemia Panel: No results for input(s): VITAMINB12, FOLATE, FERRITIN, TIBC, IRON, RETICCTPCT in the last 72 hours. Urine analysis:    Component Value Date/Time   COLORURINE YELLOW 04/22/2016 0309   APPEARANCEUR CLEAR 04/22/2016 0309   LABSPEC 1.025 04/22/2016 0309   PHURINE 5.0 04/22/2016 0309   GLUCOSEU 50 (A) 04/22/2016 0309   HGBUR NEGATIVE 04/22/2016 0309   HGBUR trace-intact 02/16/2007 1038   BILIRUBINUR NEGATIVE 04/22/2016 0309   BILIRUBINUR n 08/30/2015 1045   KETONESUR NEGATIVE 04/22/2016 0309   PROTEINUR 100 (A) 04/22/2016 0309   UROBILINOGEN 1.0 08/30/2015 1045   UROBILINOGEN 1.0 01/14/2010  0629   NITRITE NEGATIVE 04/22/2016 0309   LEUKOCYTESUR NEGATIVE 04/22/2016 0309    Creatinine Clearance: Estimated Creatinine Clearance: 46.6 mL/min (A) (by C-G formula based on SCr of 1.49 mg/dL (H)).  Sepsis Labs: @LABRCNTIP (procalcitonin:4,lacticidven:4) ) Recent Results (from the past 240 hour(s))  MRSA PCR Screening  Status: None   Collection Time: 04/22/16  5:11 AM  Result Value Ref Range Status   MRSA by PCR NEGATIVE NEGATIVE Final    Comment:        The GeneXpert MRSA Assay (FDA approved for NASAL specimens only), is one component of a comprehensive MRSA colonization surveillance program. It is not intended to diagnose MRSA infection nor to guide or monitor treatment for MRSA infections.      Radiological Exams on Admission: Dg Chest Portable 1 View  Result Date: 04/21/2016 CLINICAL DATA:  Shortness of breath with palpitation EXAM: PORTABLE CHEST 1 VIEW COMPARISON:  01/15/2016, 09/29/2015 FINDINGS: Suspect small bilateral effusions. Bibasilar opacities left greater than right may reflect atelectasis edema or pneumonia. There is cardiomegaly with mild central congestion. Atherosclerosis. No pneumothorax. IMPRESSION: Suspect small bilateral effusions with left greater than right bibasilar atelectasis, pneumonia or edema. There is cardiomegaly with mild central congestion Electronically Signed   By: Donavan Foil M.D.   On: 04/21/2016 23:47    EKG: Independently reviewed. Atrial fibrillation Abnormal R-wave progression, late transition Borderline T wave abnormalities Borderline prolonged QT interval  Assessment/Plan Principal Problem:   Acute respiratory failure with hypoxia (HCC) Active Problems:   Essential hypertension   CAD S/P percutaneous coronary angioplasty   Abdominal aortic aneurysm (HCC)   CKD (chronic kidney disease), stage III   Diabetes mellitus with complication (HCC)   COPD (chronic obstructive pulmonary disease) (HCC)   Atrial fibrillation with  RVR (HCC)   Elevated troponin   Hypokalemia   Atrial fibrillation with rapid ventricular response ([place])    1. Acute respiratory failure with hypoxia likely related to atrial fibrillation with rapid ventricular response. Chest x-ray small bilateral effusions left greater than right atelectasis cardiomegaly with mild central congestion. Saturation level greater than 90% on 2 L nasal cannula -Admit to telemetry -Oxygen supplementation as indicated -cardizem gtt -monitor -wean oxygen as indicated  2. Atrial fibrillation with rapid ventricular response. Hx of same. Home meds include eliquis and plavix. [place] vasc score 4. EKG as above.  Rate 96 on admission. -continue cardizem gtt -continue home BB -transition to po as indicated per protocol -await cardiology input  3. CAD status post percutaneous coronary angioplasty/status post inferior STEMI 2007. His chest pain. Medications include statin, Plavix, beta blocker, ACE inhibitor. Initial troponin negative second  0.04. -monitor -cycle troponin -continue home meds -await cardiology input  4. Hypokalemia. Potassium level 3.2.  -replete  -recheck  5. Diabetes. Serum glucose 210 on admission. -Obtain hemoglobin A1c -Hold oral agents for now -Sliding scale insulin for optimal control  #6. chronic kidney disease. Stage 3. Creatinine 1.49 on admission. This appears to be close to baseline. -Hold nephrotoxins -Gentle IV fluids -Monitor urine output -Recheck in the morning  7. COPD. Stable at baseline. Chest x-ray as noted above -oxygen supplementation as needed -monitor -continue homemed    DVT prophylaxis: eliquis  Code Status: full  Family Communication: none present  Disposition Plan: home when ready  Consults called: cardiology per EDP  Admission status: inpatient    Radene Gunning MD [facility]  If 7PM-7AM, please contact night-coverage [email or web address] Password [email or web address]  04/22/2016, 9:45 AM

## 2016-04-22 NOTE — Consult Note (Signed)
Cardiology Consult    Patient ID: Jason Fisher MRN: [id number], DOB/AGE: 76-08-1940   Admit date: 04/21/2016 Date of Consult: 04/22/2016  Primary Physician: Nyoka Cowden, MD Primary Cardiologist: Dr. Martinique Requesting Provider: Dr. Marily Memos  Reason for Consult: Atrial fibrillation and chest pain  Patient Profile    Jason Fisher is a 76 yo male with a PMH significant for CAD, s/p PCI in 2009 and 2017, chronic atrial fibrillation rate controlled (on eliquis, toprol, and cardizem), diastolic heart failure, CKD stage III, HTN, HLD, DM, AAA, COPD, and tobacco abuse. He presented to the ED with shortness of breath. He was found to be in Afib RVR and cardiology was consulted.   Past Medical History   Past Medical History:  Diagnosis Date  . 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 BENIGN PROSTATIC HYPERTROPHY, HX OF 02/24/2007  . CAD S/P PCI    a. 2007 Inf STEMI s/p Vision BMS  2 to RCA;  b. 2009 ISR Prox RCA Stent-->with DES;  c. 09/2015 PCI: LM nl, LAD 30p/m, D1 90, LCX nl, OM1 small, 80, OM2 90 (2.5x14 Biofreedom stent), OM3 small, RCA 30p ISR, 60m ISR, 26m, 85d, EF 50-55%.  . 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 Chronic atrial fibrillation ([place]) 11/06/2009   a. On Eliquis (CHA2DS2VASc = 5).  Rate controlled w/ BB and CCB.  Marland Kitchen Chronic diastolic CHF (congestive heart failure) ([place])    a. 09/2015 Echo: EF 55-60%, mildly dil LA.  . CKD (chronic kidney disease), stage III   . COLONIC POLYPS, HX OF 11/02/2006  . DIABETES MELLITUS, TYPE II 10/29/2006  . Diverticulosis   . History of tobacco abuse    a. Quit 09/2015.  Marland Kitchen HYPERLIPIDEMIA 10/29/2006  . Hypertensive heart disease with heart failure [facility])     Past Surgical History:  Procedure Laterality Date  . CARDIAC CATHETERIZATION N/A 09/26/2015   Procedure: Left Heart Cath and Coronary Angiography;  Surgeon: Peter M Martinique, MD;  Location: [place] [facility];  Service: Cardiovascular;  Laterality: N/A;  . CARDIAC CATHETERIZATION  N/A 09/26/2015   Procedure: Coronary Stent Intervention;  Surgeon: Peter M Martinique, MD;  Location: [place] [facility];  Service: Cardiovascular;  Laterality: N/A;  . CATARACT EXTRACTION    . COLONOSCOPY    . CORONARY ANGIOPLASTY WITH STENT PLACEMENT  2007   Inferior STEMI 2007 - RCA PCI with 2 Vision BMS; Abnormal Myoview in 2009 - pRCA ins-stent restenosis & unstented disease - DES PCI Promus 3.0 x 20 mm (3.5 mm)  . INCISION AND DRAINAGE PERIRECTAL ABSCESS N/A 11/06/2015   Procedure: IRRIGATION AND DEBRIDEMENT PERIRECTAL ABSCESS, POSSIBLE HEMORRHOIDECTOMY;  Surgeon: Coralie Keens, MD;  Location: [place];  Service: General;  Laterality: N/A;  . SHOULDER SURGERY Right      Allergies  Allergies  Allergen Reactions  . Contrast Media [Iodinated Diagnostic Agents] Anaphylaxis    History of Present Illness    Jason Fisher is well-known to our service and last saw Dr. Martinique in clinic on 01/10/16. At that time, he was in his usual state of health, but reported dyspnea on exertion and fatigue; he denied chest pain, palpitations, weight changes, dizziness, and edema. This was a follow up to a hospitalization in 09/2015 with chest pain and subsequent PCI, which was complicated by PNA. He was readmitted in Oct 2017 with a perirectal abscess and is being followed by Dr. Trula Slade for AAA.   He presented to [facility] with chest pain and SOB and was found to be  in Afib RVR. Cardiology was consulted for Afib RVR and ACS evaluation. He has a history of PCI and in-stent restenosis of the RCA. He received PCI x 2 to the RCA in 2009 and returned to the cath lab and received DES to the second OM in 2017.   On arrival to the ED, he was given PO lopressor and started on a cardizem gtt. BNP was 232, slightly elevated from 09/2015 BNP of 155. Dry weight during last clinic vis was 196 lbs. On admission, he was 190 lbs. Troponins were mildly elevated 0.04 --> 0.03. EKG with Afib and T wave flattening in V5/V6.   On my interview,  he denies chest pain. He states he has been short of breath since last August after his last PCI. He states his PCP just decreased the dose of his daily lasix from 80 mg daily to 40 mg daily. He takes inhalers at home, but claims only the rescue inhaler seems to help him breath better. When asked what prompted his ED visit, he states he had upper abdominal pain, headache, and shortness of breath. He denied palpitations, nausea, vomiting, and fever. He states he sometimes gets chilled when he needs an extra blanket. He denies recent infection and recent sick contacts. He states that his breathing is a bit better today than yesterday. He is satting in the low 90s% on room air.    Inpatient Medications    . apixaban  5 mg Oral BID  . atorvastatin  20 mg Oral q1800  . azithromycin  500 mg Oral Q24H  . cefTRIAXone (ROCEPHIN)  IV  1 g Intravenous Q24H  . clopidogrel  75 mg Oral Daily  . diltiazem  10 mg Intravenous Once  . [START ON 04/23/2016] furosemide  80 mg Oral Daily  . insulin aspart  0-15 Units Subcutaneous TID WC  . insulin aspart  0-5 Units Subcutaneous QHS  . metoprolol  100 mg Oral BID     Outpatient Medications    Prior to Admission medications   Medication Sig Start Date End Date Taking? Authorizing Provider  ACCU-CHEK AVIVA PLUS test strip CHECK BLOOD SUGAR ONCE DAILY AS NEEDED 03/27/15  Yes Marletta Lor, MD  atorvastatin (LIPITOR) 20 MG tablet TAKE 1 TABLET BY MOUTH EVERY DAY 11/11/15  Yes Marletta Lor, MD  clopidogrel (PLAVIX) 75 MG tablet TAKE 1 TABLET BY MOUTH EVERY DAY 09/23/15  Yes Marletta Lor, MD  diltiazem (DILTIAZEM CD) 120 MG 24 hr capsule Take 120 mg by mouth daily.   Yes Historical Provider, MD  ELIQUIS 5 MG TABS tablet TAKE 1 TABLET (5 MG TOTAL) BY MOUTH 2 (TWO) TIMES DAILY. 03/03/16  Yes Marletta Lor, MD  furosemide (LASIX) 40 MG tablet Take 2 tablets (80 mg total) by mouth daily. 02/12/16  Yes Marletta Lor, MD  glimepiride (AMARYL) 4  MG tablet Take 0.5 tablets (2 mg total) by mouth daily with breakfast. 10/15/15  Yes Marletta Lor, MD  Insulin Pen Needle 29G X 12.7MM MISC 1 each by Does not apply route 1 day or 1 dose. 01/06/12  Yes Marletta Lor, MD  Lancets (ACCU-CHEK MULTICLIX) lancets Once daily 250.00 01/09/13  Yes Marletta Lor, MD  levalbuterol [facility]) 45 MCG/ACT inhaler Inhale 1-2 puffs into the lungs every 6 (six) hours as needed for wheezing. 03/09/16  Yes Magdalen Spatz, NP  metFORMIN (GLUCOPHAGE) 1000 MG tablet TAKE 1 TABLET TWICE DAILY WITH A MEAL 07/27/14  Yes Doretha Sou  Burnice Logan, MD  metoprolol (LOPRESSOR) 100 MG tablet Take 1 tablet (100 mg total) by mouth 2 (two) times daily. 03/07/14  Yes Marletta Lor, MD  NITROSTAT 0.4 MG SL tablet PLACE 1 TABLET UNDER TONGUE EVERY 5 MINUTES AS NEEDED Patient taking differently: PLACE 1 TABLET UNDER TONGUE EVERY 5 MINUTES AS NEEDED FOR CHEST PAIN. 11/29/13  Yes Marletta Lor, MD  oxyCODONE (OXY IR/ROXICODONE) 5 MG immediate release tablet Take 1 tablet (5 mg total) by mouth every 6 (six) hours as needed for moderate pain or severe pain. 11/08/15  Yes Domenic Polite, MD  polyethylene glycol powder (GLYCOLAX/MIRALAX) powder Take 17 g by mouth 2 (two) times daily as needed. Patient taking differently: Take 17 g by mouth 2 (two) times daily as needed for mild constipation.  09/06/15  Yes Marletta Lor, MD  Tiotropium Bromide-Olodaterol (STIOLTO RESPIMAT) 2.5-2.5 MCG/ACT AERS Inhale 2 puffs into the lungs daily. 03/09/16  Yes Magdalen Spatz, NP  VICTOZA 18 MG/3ML SOPN INJECT 0.3MLS INTO SKIN DAILY 04/17/16  Yes Marletta Lor, MD     Family History    Family History  Problem Relation Age of Onset  . Heart attack Mother   . Diabetes Mother   . Heart attack Father   . Hypertension Father   . Heart attack Brother   . Diabetes Brother     Social History    Social History   Social History  . Marital status: Married    Spouse name: N/A    . Number of children: 5  . Years of education: N/A   Occupational History  . [employer]    Social History Main Topics  . Smoking status: Former Smoker    Types: Cigarettes    Quit date: 09/08/2015  . Smokeless tobacco: Never Used     Comment: passive smoker as well  . Alcohol use No  . Drug use: No  . Sexual activity: Not on file   Other Topics Concern  . Not on file   Social History Narrative  . No narrative on file     Review of Systems    General:  No chills, fever, night sweats or weight changes.  Cardiovascular:  No chest pain, Positive for dyspnea on exertion, No edema, orthopnea, palpitations, paroxysmal nocturnal dyspnea. Dermatological: No rash, lesions/masses Respiratory: No cough, Positive for dyspnea Urologic: No hematuria, dysuria Abdominal:   No nausea, vomiting, diarrhea, bright red blood per rectum, melena, or hematemesis Neurologic:  No visual changes, changes in mental status. All other systems reviewed and are otherwise negative except as noted above.  Physical Exam    Blood pressure (!) 148/85, pulse 81, temperature 98.9 F (37.2 C), temperature source Oral, resp. rate (!) 27, height 5\' 8"  (1.727 m), weight 194 lb 3.6 oz (88.1 kg), SpO2 91 %.  General: Pleasant, NAD Psych: Normal affect. Neuro: Alert and oriented X 3. Moves all extremities spontaneously. HEENT: Normal  Neck: Supple without bruits or mild JVD. Lungs:  Resp regular and unlabored, CTA, diminished in B bases. Heart: Irregular rhythm, regular rate, no murmurs. Abdomen: Soft, non-tender, non-distended, BS + x 4.  Extremities: No clubbing, cyanosis, Trace edema. DP/PT/Radials 1+ and equal bilaterally.  Labs    Troponin [facility] of Care Test)  Recent Labs  04/21/16 2313  TROPIPOC 0.00    Recent Labs  04/22/16 0354 04/22/16 0853  TROPONINI 0.04* 0.03*   Lab Results  Component Value Date   WBC 14.9 (H) 04/21/2016   HGB 13.3 04/21/2016  HCT 40.1 04/21/2016   MCV 84.4  04/21/2016   PLT 160 04/21/2016     Recent Labs Lab 04/21/16 2311  NA 139  K 3.2*  CL 100*  CO2 26  BUN 19  CREATININE 1.49*  CALCIUM 9.8  GLUCOSE 210*   Lab Results  Component Value Date   CHOL 137 09/25/2015   HDL 47 09/25/2015   LDLCALC 77 09/25/2015   TRIG 64 09/25/2015   No results found for: [facility]   Radiology Studies    Dg Chest Portable 1 View  Result Date: 04/21/2016 CLINICAL DATA:  Shortness of breath with palpitation EXAM: PORTABLE CHEST 1 VIEW COMPARISON:  01/15/2016, 09/29/2015 FINDINGS: Suspect small bilateral effusions. Bibasilar opacities left greater than right may reflect atelectasis edema or pneumonia. There is cardiomegaly with mild central congestion. Atherosclerosis. No pneumothorax. IMPRESSION: Suspect small bilateral effusions with left greater than right bibasilar atelectasis, pneumonia or edema. There is cardiomegaly with mild central congestion Electronically Signed   By: Donavan Foil M.D.   On: 04/21/2016 23:47    ECG & Cardiac Imaging     Echocardiogram 09/27/15: Study Conclusions - Left ventricle: The cavity size was normal. Systolic function was   normal. The estimated ejection fraction was in the range of 55%   to 60%. Wall motion was normal; there were no regional wall   motion abnormalities. Doppler parameters are consistent with   elevated mean left atrial filling pressure. - Left atrium: The atrium was mildly dilated.   Left Heart Catheterization 09/26/15  Prox LAD to Mid LAD lesion, 30 %stenosed.  1st Diag lesion, 90 %stenosed.  Ost 1st Mrg to 1st Mrg lesion, 80 %stenosed.  Ost RCA to Prox RCA lesion, 30 %stenosed.  Mid RCA-2 lesion, 40 %stenosed.  Mid RCA-1 lesion, 15 %stenosed.  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  There is mild left ventricular systolic dysfunction.  The left ventricular ejection fraction is 50-55% by visual estimate.  LV end diastolic pressure is moderately  elevated.  A drug eluting stent was successfully placed.  2nd Mrg lesion, 90 %stenosed.  Post intervention, there is a 0% residual stenosis.   1. 3 vessel obstructive CAD.     - chronic severe stenosis in the first diagonal    - New 90% stenosis in a large second OM    - Stents in the proximal and mid RCA are patent    - Aneurysm and stenosis in the PLOM branch of the RCA- the aneurysm is new compared to 2009 2. Mild LV dysfunction 3. Elevated LVEDP 4. Allergic response to contrast with hives and bronchospasm. 5. Successful stenting of the second OM with a Biofreedom stent.   Plan: continue IV diuresis. Patient received steroids, Benadryl, and IV pepcid for contrast reaction. Continue DAPT for one month then DC ASA. Resume Eliquis in am.    Left Heart Catheterization 10/03/07 ASSESSMENT:  1. Three-vessel coronary artery disease with high-grade lesions in the      first diagonal which appear old in right coronary artery which may      be between the stents.  2. Low normal left ventricular function.  3. Infrarenal abdominal aortic aneurysm.   PLAN:  Plan will be for percutaneous intervention on the RCA today with  Dr. Angelena Form.  He will need outpatient abdominal CT to further evaluate  his abdominal aortic aneurysm.  He is being counseled for the need for  smoking cessation.   Left Heart Catheterization 10/03/07  IMPRESSION:  1. In-stent restenosis of proximal RCA as well as progression of non-      stented disease in proximal RCA.  2. Successful percutaneous coronary intervention with placement of a      Promus drug-eluting stent in the proximal right coronary artery.   RECOMMENDATIONS:  I recommend continued medical management at this point  to include aspirin, statin, Plavix, and a beta-blocker.  This patient  should be continued on Plavix for at least 1 year.    Left Heart Catheterization  04/30/00 IMPRESSIONS: 1. Preserved left ventricular systolic function. 2.  Moderate diffuse coronary artery disease as described.  It is unclear which    vessel is the culprit; however, I suspect it may be the diagonal which    has a long area of disease.  This is a relatively small caliber vessel.  RECOMMENDATIONS:  Aggressive medical therapy with risk factor modification. Would also consider obtaining a followup stress imaging study in approximately three to four weeks.   Assessment & Plan    1. Chronic atrial fibrillation - home medications: lopressor and cardizem - continue eliquis 5 mg bid - currently on cardizem gtt at 10 mg/hr. Can transition this to PO 60 mg q6hr and titrate as needed. - given his leukocytosis, bout of Afib RVR may be related to an underlying infection. He does not appear volume overloaded on exam and BNP elevation may be secondary to CKD; however, pleural effusion on CXR indicates acute on chronic diastolic heart failure and this exacerbation may be related to his Afib RVR.   2. Chest pain / CAD s/p PCI  - troponin mildly elevated 0.04 --> 0.03 - EKG with nonspecific T wave changes and poor R wave progression - NSTEMI (2002, no PCI, no culprit lesion found); PCI x 2 to RCA for inferior MI (2007); PCI x 3 for in-stent restenosis in proximal RCA (2009) - he denies chest pain since August, only reports shortness of breath. He does not feel chest pain like he felt during his previous MI's that resulted in PCI - troponin elevation likely secondary to diastolic heart failure - continue plavix, no ASA - start 80 mg IV lasix BID today   3. Leukocytosis - Admission WBC 14.9 - respiratory panel was negative; pt Tmax 99.4 - blood cultures pending, urine negative so far - ABX started, per primary team - CXR with bilateral pleural effusions and possible PNA   4. Acute on Chronic diastolic heart failure - BNP 232 on admission, compared to 155 in August 2017 - this acute exacerbation may be related to Afib RVR - dry weight at last clinic  visit was 196 lbs, he was 190 lbs - cardiomegaly noted on CXR with bilateral pleural effusions   5. CKD stage III - per primary team - sCr on admission 1.49, at baseline of 1.3-1.55   6. DM - per primary team   7. COPD - per primary team   8. AAA - last clinic visit with Dr. Trula Slade was 12/09/15: 4.8 cm infrarenal abdominal aortic aneurysm - per primary team - would suggest contacting Dr. Stephens Shire office - the patient describes upper abdominal pain but states that it is not related to heartburn or reflux and questions if it is related to his AAA  - consider repeat CT abdomen pelvis to re-evaluate AAA   Signed, Ledora Bottcher, PA-C 04/22/2016, 3:21 PM  I have examined the patient and reviewed assessment and plan and discussed with patient.  Agree with above as  stated.  Acute on chronic diastolic heart failure due to increase HR which may have been from infection.  No ischemia w/u at this time needed. Abdominal pain was mild and improved with BM.  Not very severe pain.  Unlikely to be AAA. Continue f/u with vascular surgery.  No anginal sx at this time.  Nothing like his prior angina. Mildly elevated troponin likely from increased rate.  Will follow.   Larae Grooms

## 2016-04-23 DIAGNOSIS — E118 Type 2 diabetes mellitus with unspecified complications: Secondary | ICD-10-CM

## 2016-04-23 LAB — CBC
HCT: 36.9 % — ABNORMAL LOW (ref 39.0–52.0)
HEMOGLOBIN: 11.4 g/dL — AB (ref 13.0–17.0)
MCH: 26.4 pg (ref 26.0–34.0)
MCHC: 30.9 g/dL (ref 30.0–36.0)
MCV: 85.4 fL (ref 78.0–100.0)
Platelets: 153 10*3/uL (ref 150–400)
RBC: 4.32 MIL/uL (ref 4.22–5.81)
RDW: 15.8 % — AB (ref 11.5–15.5)
WBC: 10.3 10*3/uL (ref 4.0–10.5)

## 2016-04-23 LAB — GLUCOSE, CAPILLARY
Glucose-Capillary: 151 mg/dL — ABNORMAL HIGH (ref 65–99)
Glucose-Capillary: 257 mg/dL — ABNORMAL HIGH (ref 65–99)
Glucose-Capillary: 326 mg/dL — ABNORMAL HIGH (ref 65–99)
Glucose-Capillary: 356 mg/dL — ABNORMAL HIGH (ref 65–99)

## 2016-04-23 LAB — BASIC METABOLIC PANEL
ANION GAP: 12 (ref 5–15)
BUN: 20 mg/dL (ref 6–20)
CALCIUM: 8.8 mg/dL — AB (ref 8.9–10.3)
CO2: 27 mmol/L (ref 22–32)
Chloride: 100 mmol/L — ABNORMAL LOW (ref 101–111)
Creatinine, Ser: 1.46 mg/dL — ABNORMAL HIGH (ref 0.61–1.24)
GFR, EST AFRICAN AMERICAN: 52 mL/min — AB (ref >60)
GFR, EST NON AFRICAN AMERICAN: 45 mL/min — AB (ref >60)
GLUCOSE: 163 mg/dL — AB (ref 65–99)
POTASSIUM: 3.5 mmol/L (ref 3.5–5.1)
SODIUM: 139 mmol/L (ref 135–145)

## 2016-04-23 LAB — HEMOGLOBIN A1C
HEMOGLOBIN A1C: 6.3 % — AB (ref 4.8–5.6)
Mean Plasma Glucose: 134 mg/dL

## 2016-04-23 LAB — HIV ANTIBODY (ROUTINE TESTING W REFLEX): HIV SCREEN 4TH GENERATION: NONREACTIVE

## 2016-04-23 MED ORDER — INSULIN ASPART 100 UNIT/ML ~~LOC~~ SOLN
0.0000 [IU] | Freq: Three times a day (TID) | SUBCUTANEOUS | Status: DC
  Administered 2016-04-23: 15 [IU] via SUBCUTANEOUS
  Administered 2016-04-24: 4 [IU] via SUBCUTANEOUS
  Administered 2016-04-24: 11 [IU] via SUBCUTANEOUS

## 2016-04-23 MED ORDER — DILTIAZEM HCL 60 MG PO TABS
90.0000 mg | ORAL_TABLET | Freq: Four times a day (QID) | ORAL | Status: DC
  Administered 2016-04-23 – 2016-04-24 (×5): 90 mg via ORAL
  Filled 2016-04-23 (×5): qty 1

## 2016-04-23 NOTE — Progress Notes (Addendum)
Progress Note  Patient Name: Jason Fisher Date of Encounter: 04/23/2016  Primary Cardiologist: Martinique  Subjective   Feels well.  Inpatient Medications    Scheduled Meds: . apixaban  5 mg Oral BID  . atorvastatin  20 mg Oral q1800  . azithromycin  500 mg Oral Q24H  . cefTRIAXone (ROCEPHIN)  IV  1 g Intravenous Q24H  . clopidogrel  75 mg Oral Daily  . diltiazem  10 mg Intravenous Once  . diltiazem  90 mg Oral Q6H  . furosemide  80 mg Intravenous BID  . insulin aspart  0-15 Units Subcutaneous TID WC  . insulin aspart  0-5 Units Subcutaneous QHS  . metoprolol  100 mg Oral BID   Continuous Infusions:  PRN Meds: acetaminophen, morphine injection, nitroGLYCERIN, ondansetron (ZOFRAN) IV, oxyCODONE   Vital Signs    Vitals:   04/23/16 0743 04/23/16 0800 04/23/16 0900 04/23/16 1131  BP: (!) 110/53 128/72  117/69  Pulse: 99 100 (!) 106 66  Resp: 18 20 (!) 22 (!) 22  Temp: 98.1 F (36.7 C)   97.6 F (36.4 C)  TempSrc: Oral   Oral  SpO2: 94% 92% 95% 94%  Weight:      Height:        Intake/Output Summary (Last 24 hours) at 04/23/16 1304 Last data filed at 04/23/16 1238  Gross per 24 hour  Intake             1060 ml  Output             1740 ml  Net             -680 ml   Filed Weights   04/21/16 2309 04/22/16 0701 04/23/16 0337  Weight: 190 lb (86.2 kg) 194 lb 3.6 oz (88.1 kg) 193 lb 8 oz (87.8 kg)    Telemetry    AFib, variable rate control - Personally Reviewed  ECG    AFib with RVR - Personally Reviewed  Physical exam: GEN: No acute distress.   Neck: No JVD Cardiac: irregularly irregular, no murmurs, rubs, or gallops.  Respiratory: Clear to auscultation bilaterally. GI: Soft, nontender, non-distended  MS: No edema; No deformity. Neuro:  Nonfocal  Psych: Normal affect   Labs    Chemistry Recent Labs Lab 04/21/16 2311 04/23/16 0234  NA 139 139  K 3.2* 3.5  CL 100* 100*  CO2 26 27  GLUCOSE 210* 163*  BUN 19 20  CREATININE 1.49* 1.46*    CALCIUM 9.8 8.8*  GFRNONAA 44* 45*  GFRAA 51* 52*  ANIONGAP 13 12     Hematology Recent Labs Lab 04/21/16 2311 04/23/16 0234  WBC 14.9* 10.3  RBC 4.75 4.32  HGB 13.3 11.4*  HCT 40.1 36.9*  MCV 84.4 85.4  MCH 28.0 26.4  MCHC 33.2 30.9  RDW 15.7* 15.8*  PLT 160 153    Cardiac Enzymes Recent Labs Lab 04/22/16 0354 04/22/16 0853 04/22/16 1422  TROPONINI 0.04* 0.03* <0.03    Recent Labs Lab 04/21/16 2313  TROPIPOC 0.00     BNP Recent Labs Lab 04/22/16 0354  BNP 232.6*     DDimer No results for input(s): DDIMER in the last 168 hours.   Radiology    Dg Chest Portable 1 View  Result Date: 04/21/2016 CLINICAL DATA:  Shortness of breath with palpitation EXAM: PORTABLE CHEST 1 VIEW COMPARISON:  01/15/2016, 09/29/2015 FINDINGS: Suspect small bilateral effusions. Bibasilar opacities left greater than right may reflect atelectasis edema or pneumonia. There is  cardiomegaly with mild central congestion. Atherosclerosis. No pneumothorax. IMPRESSION: Suspect small bilateral effusions with left greater than right bibasilar atelectasis, pneumonia or edema. There is cardiomegaly with mild central congestion Electronically Signed   By: Donavan Foil M.D.   On: 04/21/2016 23:47    Cardiac Studies   EF55-60% in 2017  Patient Profile     76 y.o. male AFib, diastolic heart failure  Assessment & Plan    AFib: Increase dilt to 90 mg QID.  Change from IV dilt.  If this controls rate, would DC on Cardizem CD 360 mg daily.  Eliquis for stroke prevention.  Acute on chronic diastolic heart failure:  Breathing better after diuresis.  Likely triggered by increased HR from infection.  COntinue furosemide at discharge.    CAD: no angina  CKD: follow renal function.  AAA: no further abdominal pain reported Signed, Larae Grooms, MD  04/23/2016, 1:04 PM

## 2016-04-23 NOTE — Progress Notes (Signed)
PROGRESS NOTE  Jason Fisher  OEU:[id number] DOB: 1940/05/17 DOA: 04/21/2016 PCP: Nyoka Cowden, MD  Outpatient Specialists: Cardiology, Dr. Martinique  Brief Narrative: Jason Fisher is a pleasant 76 y.o. male with a history of chronic A fib, CAD s/p PCI, NIDDM, AAA, HTN, chronic diastolic CHF, COPD, stage III CKD who presented for 2 days of gradually worsening chest pain and dyspnea found to be hypoxic and with AFib w/RVR. diabetes, abdominal aortic aneurysm, hypertension, diastolic heart failure, COPD, chronic kidney disease stage III, dose emergency department with the chief complaint of chest pain and shortness of breath. Initial evaluation reveals Q respiratory failure with hypoxia likely related to A. fib with RVR. Cardizem infusion was started for rate control and cardiology was consulted.   Assessment & Plan: Principal Problem:   Atrial fibrillation with rapid ventricular response (HCC) Active Problems:   Essential hypertension   CAD S/P percutaneous coronary angioplasty   Abdominal aortic aneurysm (HCC)   CKD (chronic kidney disease), stage III   Acute respiratory failure with hypoxia (HCC)   Diabetes mellitus with complication (HCC)   COPD (chronic obstructive pulmonary disease) (HCC)   Atrial fibrillation with RVR (HCC)   Elevated troponin   Hypokalemia  Acute respiratory failure with hypoxia: likely related to AFib w/RVR which caused acute volume overload.  - Continue supplemental oxygen as needed  - Treat etiologies as below.  Atrial fibrillation with rapid ventricular response: Chronic AFib at baseline.  - Continue home meds, augmenting cardizem to 90mg  QID and toprol. Weaning off cardizem gtt. Plan to consolidate to daily cardizem dose that controls rate. - Continue eliquis (CHADSVASc is 4)  Acute on chronic diastolic CHF: Mild. CXR w/effusions, mild vascular congestion, and cardiomegaly, though BNP is equivocal with stage III CKD. He is below EDW (190lbs  from 196lbs at last clinic visit).   - Continue lasix 80mg  IV BID per cardiology.  - Strict I/O, daily weight  CAD s/p PCI with in-stent restenosis of RCA leaqding to PCI x2 to RCA in 2009 and DES to second OM 2017: Troponin very mildly elevated consistent with demand ischemia due to AFib w/RVR/diastolic CHF. ECG w/nonspecific T wave changes. Though reported as chief complaint, he denies chest pain.  - No intervention per cardiology recommendations - Continue plavix, statin, beta blocker.   Community-acquired pneumonia: Suggested by CXR findings, cough, leukocytosis.  - Continue CTX, azithromycin.  - Monitor blood and sputum cultures.   Hypokalemia. Potassium level 3.2, repleted to 3.5.  - Will give more potassium to aim for K = 4 - Monitor in AM with Mg  NIDDM: Well-controlled at baseline, with hyperglycemia on admission. HbA1c 6.3%.  - Hold po medications - SSI, augmenting to resistant scale + HS correction  Stage III CKD: Creatinine 1.49 on admission, near baseline of 1.3 - 1.5 . - Hold nephrotoxins - Monitor urine output, BMP  COPD: Without evidence of acute exacerbation.  - Oxygen supplementation as needed - Continue home medications  AAA: 4.8cm infrarenal, followed by Dr. Trula Slade.  - No intervention planned. No abdominal pain.   DVT prophylaxis: Eliquis Code Status: Full Family Communication: None at bedside Disposition Plan: Transfer to telemetry once stable off gtt  Consultants:   Cardiology, Dr. Irish Lack  Procedures:   None  Antimicrobials:  CTX 3/21 >>   Azithromycin 3/21 >>    Subjective: Pt feels overall better, no chest pain currently. No dyspnea, abd pain, N/V.  Objective: Vitals:   04/23/16 0743 04/23/16 0800 04/23/16 0900 04/23/16 1131  BP: Marland Kitchen)  110/53 128/72  117/69  Pulse: 99 100 (!) 106 66  Resp: 18 20 (!) 22 (!) 22  Temp: 98.1 F (36.7 C)   97.6 F (36.4 C)  TempSrc: Oral   Oral  SpO2: 94% 92% 95% 94%  Weight:      Height:         Intake/Output Summary (Last 24 hours) at 04/23/16 1403 Last data filed at 04/23/16 1238  Gross per 24 hour  Intake             1000 ml  Output             1740 ml  Net             -740 ml   Filed Weights   04/21/16 2309 04/22/16 0701 04/23/16 0337  Weight: 86.2 kg (190 lb) 88.1 kg (194 lb 3.6 oz) 87.8 kg (193 lb 8 oz)    Examination: General exam: 76 y.o. male in no distress Respiratory system: Non-labored breathing on room air. Mild bibasilar crackles.  Cardiovascular system: Irreg irreg. No murmur, rub, or gallop. No JVD, and trace pedal edema. Gastrointestinal system: Abdomen soft, non-tender, non-distended, with normoactive bowel sounds. No organomegaly or masses felt. Central nervous system: Alert and oriented. No focal neurological deficits. Extremities: Warm, no deformities Skin: No rashes, lesions no ulcers. Interspersed sebK's. Psychiatry: Judgement and insight appear normal. Mood & affect appropriate.   Data Reviewed: I have personally reviewed following labs and imaging studies  CBC:  Recent Labs Lab 04/21/16 2311 04/23/16 0234  WBC 14.9* 10.3  HGB 13.3 11.4*  HCT 40.1 36.9*  MCV 84.4 85.4  PLT 160 631   Basic Metabolic Panel:  Recent Labs Lab 04/21/16 2311 04/23/16 0234  NA 139 139  K 3.2* 3.5  CL 100* 100*  CO2 26 27  GLUCOSE 210* 163*  BUN 19 20  CREATININE 1.49* 1.46*  CALCIUM 9.8 8.8*   GFR: Estimated Creatinine Clearance: 47.1 mL/min (A) (by C-G formula based on SCr of 1.46 mg/dL (H)). Liver Function Tests: No results for input(s): AST, ALT, ALKPHOS, BILITOT, PROT, ALBUMIN in the last 168 hours. No results for input(s): LIPASE, AMYLASE in the last 168 hours. No results for input(s): AMMONIA in the last 168 hours. Coagulation Profile: No results for input(s): INR, PROTIME in the last 168 hours. Cardiac Enzymes:  Recent Labs Lab 04/22/16 0354 04/22/16 0853 04/22/16 1422  TROPONINI 0.04* 0.03* <0.03   BNP (last 3  results)  Recent Labs  02/05/16 1206  PROBNP 289.0*   HbA1C:  Recent Labs  04/22/16 0853  HGBA1C 6.3*   CBG:  Recent Labs Lab 04/22/16 1312 04/22/16 1821 04/22/16 2114 04/23/16 0745 04/23/16 1133  GLUCAP 288* 243* 259* 151* 257*   Lipid Profile: No results for input(s): CHOL, HDL, LDLCALC, TRIG, CHOLHDL, LDLDIRECT in the last 72 hours. Thyroid Function Tests:  Recent Labs  04/22/16 0355  TSH 0.424   Anemia Panel: No results for input(s): VITAMINB12, FOLATE, FERRITIN, TIBC, IRON, RETICCTPCT in the last 72 hours. Urine analysis:    Component Value Date/Time   COLORURINE YELLOW 04/22/2016 0309   APPEARANCEUR CLEAR 04/22/2016 0309   LABSPEC 1.025 04/22/2016 0309   PHURINE 5.0 04/22/2016 0309   GLUCOSEU 50 (A) 04/22/2016 0309   HGBUR NEGATIVE 04/22/2016 0309   HGBUR trace-intact 02/16/2007 1038   BILIRUBINUR NEGATIVE 04/22/2016 0309   BILIRUBINUR n 08/30/2015 1045   KETONESUR NEGATIVE 04/22/2016 0309   PROTEINUR 100 (A) 04/22/2016 0309   UROBILINOGEN 1.0 08/30/2015  [place] 1.0 01/14/2010 0629   NITRITE NEGATIVE 04/22/2016 0309   LEUKOCYTESUR NEGATIVE 04/22/2016 0309   Recent Results (from the past 240 hour(s))  MRSA PCR Screening     Status: None   Collection Time: 04/22/16  5:11 AM  Result Value Ref Range Status   MRSA by PCR NEGATIVE NEGATIVE Final    Comment:        The GeneXpert MRSA Assay (FDA approved for NASAL specimens only), is one component of a comprehensive MRSA colonization surveillance program. It is not intended to diagnose MRSA infection nor to guide or monitor treatment for MRSA infections.   Respiratory Panel by PCR     Status: None   Collection Time: 04/22/16 11:04 AM  Result Value Ref Range Status   Adenovirus NOT DETECTED NOT DETECTED Final   Coronavirus 229E NOT DETECTED NOT DETECTED Final   Coronavirus HKU1 NOT DETECTED NOT DETECTED Final   Coronavirus NL63 NOT DETECTED NOT DETECTED Final   Coronavirus OC43  NOT DETECTED NOT DETECTED Final   Metapneumovirus NOT DETECTED NOT DETECTED Final   Rhinovirus / Enterovirus NOT DETECTED NOT DETECTED Final   Influenza A NOT DETECTED NOT DETECTED Final   Influenza B NOT DETECTED NOT DETECTED Final   Parainfluenza Virus 1 NOT DETECTED NOT DETECTED Final   Parainfluenza Virus 2 NOT DETECTED NOT DETECTED Final   Parainfluenza Virus 3 NOT DETECTED NOT DETECTED Final   Parainfluenza Virus 4 NOT DETECTED NOT DETECTED Final   Respiratory Syncytial Virus NOT DETECTED NOT DETECTED Final   Bordetella pertussis NOT DETECTED NOT DETECTED Final   Chlamydophila pneumoniae NOT DETECTED NOT DETECTED Final   Mycoplasma pneumoniae NOT DETECTED NOT DETECTED Final  Culture, sputum-assessment     Status: None   Collection Time: 04/22/16  1:00 PM  Result Value Ref Range Status   Specimen Description EXPECTORATED SPUTUM  Final   Special Requests NONE  Final   Sputum evaluation THIS SPECIMEN IS ACCEPTABLE FOR SPUTUM CULTURE  Final   Report Status 04/22/2016 FINAL  Final  Culture, respiratory (NON-Expectorated)     Status: None (Preliminary result)   Collection Time: 04/22/16  1:00 PM  Result Value Ref Range Status   Specimen Description EXPECTORATED SPUTUM  Final   Special Requests NONE Reflexed from [id number]  Final   Gram Stain   Final    MODERATE WBC PRESENT, PREDOMINANTLY PMN FEW GRAM POSITIVE COCCI IN PAIRS AND CHAINS RARE GRAM NEGATIVE RODS RARE GRAM POSITIVE RODS FEW GRAM NEGATIVE DIPLOCOCCI    Culture CULTURE REINCUBATED FOR BETTER GROWTH  Final   Report Status PENDING  Incomplete      Radiology Studies: Dg Chest Portable 1 View  Result Date: 04/21/2016 CLINICAL DATA:  Shortness of breath with palpitation EXAM: PORTABLE CHEST 1 VIEW COMPARISON:  01/15/2016, 09/29/2015 FINDINGS: Suspect small bilateral effusions. Bibasilar opacities left greater than right may reflect atelectasis edema or pneumonia. There is cardiomegaly with mild central congestion.  Atherosclerosis. No pneumothorax. IMPRESSION: Suspect small bilateral effusions with left greater than right bibasilar atelectasis, pneumonia or edema. There is cardiomegaly with mild central congestion Electronically Signed   By: Donavan Foil M.D.   On: 04/21/2016 23:47    Scheduled Meds: . apixaban  5 mg Oral BID  . atorvastatin  20 mg Oral q1800  . azithromycin  500 mg Oral Q24H  . cefTRIAXone (ROCEPHIN)  IV  1 g Intravenous Q24H  . clopidogrel  75 mg Oral Daily  . diltiazem  10 mg Intravenous Once  .  diltiazem  90 mg Oral Q6H  . furosemide  80 mg Intravenous BID  . insulin aspart  0-15 Units Subcutaneous TID WC  . insulin aspart  0-5 Units Subcutaneous QHS  . metoprolol  100 mg Oral BID   Continuous Infusions:   LOS: 1 day   Time spent: 25 minutes.  Vance Gather, MD [facility] Pager [phone]  If 7PM-7AM, please contact night-coverage [email or web address] Password [facility] 04/23/2016, 2:03 PM

## 2016-04-24 DIAGNOSIS — I5033 Acute on chronic diastolic (congestive) heart failure: Secondary | ICD-10-CM

## 2016-04-24 LAB — GLUCOSE, CAPILLARY
Glucose-Capillary: 181 mg/dL — ABNORMAL HIGH (ref 65–99)
Glucose-Capillary: 299 mg/dL — ABNORMAL HIGH (ref 65–99)

## 2016-04-24 LAB — CULTURE, RESPIRATORY

## 2016-04-24 LAB — BASIC METABOLIC PANEL
Anion gap: 11 (ref 5–15)
BUN: 25 mg/dL — AB (ref 6–20)
CALCIUM: 8.4 mg/dL — AB (ref 8.9–10.3)
CO2: 26 mmol/L (ref 22–32)
CREATININE: 1.49 mg/dL — AB (ref 0.61–1.24)
Chloride: 102 mmol/L (ref 101–111)
GFR calc Af Amer: 51 mL/min — ABNORMAL LOW (ref >60)
GFR, EST NON AFRICAN AMERICAN: 44 mL/min — AB (ref >60)
GLUCOSE: 187 mg/dL — AB (ref 65–99)
POTASSIUM: 3.1 mmol/L — AB (ref 3.5–5.1)
Sodium: 139 mmol/L (ref 135–145)

## 2016-04-24 LAB — MAGNESIUM: Magnesium: 2.3 mg/dL (ref 1.7–2.4)

## 2016-04-24 LAB — CULTURE, RESPIRATORY W GRAM STAIN: Culture: NORMAL

## 2016-04-24 LAB — LEGIONELLA PNEUMOPHILA SEROGP 1 UR AG: L. pneumophila Serogp 1 Ur Ag: NEGATIVE

## 2016-04-24 LAB — PROCALCITONIN: PROCALCITONIN: 0.39 ng/mL

## 2016-04-24 MED ORDER — LEVOFLOXACIN 500 MG PO TABS: 500.0000 mg | ORAL_TABLET | Freq: Every day | ORAL | 0 refills | Status: DC

## 2016-04-24 MED ORDER — DILTIAZEM HCL ER COATED BEADS 360 MG PO CP24: 360.0000 mg | ORAL_CAPSULE | Freq: Every day | ORAL | 0 refills | Status: DC

## 2016-04-24 MED ORDER — POTASSIUM CHLORIDE CRYS ER 20 MEQ PO TBCR
40.0000 meq | EXTENDED_RELEASE_TABLET | Freq: Two times a day (BID) | ORAL | Status: DC
  Administered 2016-04-24: 40 meq via ORAL
  Filled 2016-04-24: qty 2

## 2016-04-24 NOTE — Consult Note (Signed)
   [facility] CM Inpatient Consult   04/24/2016  Jason Fisher [birth year] [id number]  Patient in the [facility] registry with his 2nd admission in the past 6 months. Chart review reveals the patient is a 76 year old male admitted with AFib with rapid ventricular response and slight volume overload as well as pneumonia and have improved with treatment per MD notes.  Patient was assessed for [facility] for community services. Patient was previously active with [facility] with [place].  Met with patient and wife at bedside regarding being restarted with [facility] services. Consent form on file and signed and a brochure with [facility] information given.   Patient states, "I will be fine as I will now start going to cardiac rehab."  Patient denies any issues with medications, transportation or follow up with Dr. Bluford Kaufmann [facility] care provider] verbalized. Patient decline post hospital follow up at this time. States they have the information, if needed. For additional questions or referrals please contact:  Natividad Brood, RN BSN [facility] Liaison  [phone] business mobile phone Toll free office [phone]

## 2016-04-24 NOTE — Progress Notes (Signed)
Pt discharged to home via wife/private vehicle. Room inspected for personal belongings. AVS printed and reviewed with patient and wife. All questions answered. Care plan, line log and documentation complete  Leonard Schwartz

## 2016-04-24 NOTE — Progress Notes (Signed)
Progress Note  Patient Name: Jason Fisher Date of Encounter: 04/24/2016  Primary Cardiologist: Martin[place]  Subjective   Feels well.  Inpatient Medications    Scheduled Meds: . apixaban  5 mg Oral BID  . atorvastatin  20 mg Oral q1800  . azithromycin  500 mg Oral Q24H  . cefTRIAXone (ROCEPHIN)  IV  1 g Intravenous Q24H  . clopidogrel  75 mg Oral Daily  . diltiazem  10 mg Intravenous Once  . diltiazem  90 mg Oral Q6H  . furosemide  80 mg Intravenous BID  . insulin aspart  0-20 Units Subcutaneous TID WC  . insulin aspart  0-5 Units Subcutaneous QHS  . metoprolol  100 mg Oral BID  . potassium chloride  40 mEq Oral BID   Continuous Infusions:  PRN Meds: acetaminophen, morphine injection, nitroGLYCERIN, ondansetron (ZOFRAN) IV, oxyCODONE   Vital Signs    Vitals:   04/24/16 0046 04/24/16 0437 04/24/16 0758 04/24/16 0957  BP: (!) 100/49 (!) 123/54 (!) 120/45 (!) 120/45  Pulse: 94 94 64   Resp: 20 20 20    Temp: 98.7 F (37.1 C) 98.1 F (36.7 C) 98.4 F (36.9 C)   TempSrc: Oral Oral Oral   SpO2: 96% 95% 98%   Weight:  194 lb 6.4 oz (88.2 kg)    Height:        Intake/Output Summary (Last 24 hours) at 04/24/16 1144 Last data filed at 04/24/16 1050  Gross per 24 hour  Intake             1230 ml  Output              805 ml  Net              425 ml   Filed Weights   04/23/16 0337 04/23/16 1856 04/24/16 0437  Weight: 193 lb 8 oz (87.8 kg) 193 lb 14.4 oz (88 kg) 194 lb 6.4 oz (88.2 kg)    Telemetry    AFib, variable rate control - Personally Reviewed  ECG    AFib with controlled rate- Personally Reviewed  Physical exam: GEN: No acute distress.   Neck: No JVD Cardiac: irregularly irregular, no murmurs, rubs, or gallops.  Respiratory: Clear to auscultation bilaterally. GI: Soft, nontender, non-distended  MS: No edema; No deformity. Neuro:  Nonfocal  Psych: Normal affect   Labs    Chemistry  Recent Labs Lab 04/21/16 2311 04/23/16 0234 04/24/16 0416    NA 139 139 139  K 3.2* 3.5 3.1*  CL 100* 100* 102  CO2 26 27 26   GLUCOSE 210* 163* 187*  BUN 19 20 25*  CREATININE 1.49* 1.46* 1.49*  CALCIUM 9.8 8.8* 8.4*  GFRNONAA 44* 45* 44*  GFRAA 51* 52* 51*  ANIONGAP 13 12 11      Hematology  Recent Labs Lab 04/21/16 2311 04/23/16 0234  WBC 14.9* 10.3  RBC 4.75 4.32  HGB 13.3 11.4*  HCT 40.1 36.9*  MCV 84.4 85.4  MCH 28.0 26.4  MCHC 33.2 30.9  RDW 15.7* 15.8*  PLT 160 153    Cardiac Enzymes  Recent Labs Lab 04/22/16 0354 04/22/16 0853 04/22/16 1422  TROPONINI 0.04* 0.03* <0.03     Recent Labs Lab 04/21/16 2313  TROPIPOC 0.00     BNP  Recent Labs Lab 04/22/16 0354  BNP 232.6*     DDimer No results for input(s): DDIMER in the last 168 hours.   Radiology    No results found.  Cardiac Studies  EF55-60% in 2017  Patient Profile     76 y.o. male AFib, diastolic heart failure  Assessment & Plan    AFib: Tolerating dilt to 90 mg QID.  Changed from IV dilt.  Since this controls rate, would DC on Cardizem CD 360 mg daily.  Eliquis for stroke prevention.  Acute on chronic diastolic heart failure:  Breathing better after diuresis.  Likely triggered by increased HR from infection.  COntinue furosemide at discharge.  He appears euvolemic.  CAD: no angina.  Feels well   CKD: follow renal function.  Stable.  AAA: no further abdominal pain reported.   OK to discharge from a cardiac standpoint. Signed, Larae Grooms, MD  04/24/2016, 11:44 AM

## 2016-04-24 NOTE — Progress Notes (Signed)
Patient slept well tonight, no complaints or concerns.

## 2016-04-24 NOTE — Discharge Summary (Signed)
Physician Discharge Summary  JORRELL KUSTER ZOX:[id number] DOB: 1940-07-30 DOA: 04/21/2016  PCP: Nyoka Cowden, MD  Admit date: 04/21/2016 Discharge date: 04/24/2016  Admitted From: Home Disposition: Home   Recommendations for Outpatient Follow-up:  1. Follow up with PCP in 1-2 weeks 2. Please obtain BMP/CBC in one week 3. Please follow up on the following pending results:  Home Health: None Equipment/Devices: None Discharge Condition: Stable CODE STATUS: Full Diet recommendation: Heart healthy  Brief/Interim Summary: Jason Zwahlen Newsomeis a pleasant 76 y.o.malewith a history of chronic A fib, CAD s/p PCI, NIDDM, AAA, HTN, chronic diastolic CHF, COPD, stage III CKD who presented for 2 days of gradually worsening chest pain and dyspnea found to be hypoxic and with AFib w/RVR. Cardizem infusion was started for rate control and cardiology was consulted. The patient was diuresed with IV lasix with good effect, and rate was controlled with oral diltiazem 90mg  QID with resolution of respiratory failure.   Discharge Diagnoses:  Principal Problem:   Atrial fibrillation with rapid ventricular response (HCC) Active Problems:   Essential hypertension   CAD S/P percutaneous coronary angioplasty   Abdominal aortic aneurysm (HCC)   CKD (chronic kidney disease), stage III   Acute respiratory failure with hypoxia (HCC)   Diabetes mellitus with complication (HCC)   COPD (chronic obstructive pulmonary disease) (HCC)   Atrial fibrillation with RVR (HCC)   Elevated troponin   Hypokalemia  Acute respiratory failure with hypoxia: likely related to AFib w/RVR which caused acute volume overload.  - Continue supplemental oxygen as needed > weaned to room air, no dyspnea even with exertion.   - Treated etiologies as below.  Atrial fibrillation with rapid ventricular response: Chronic AFib at baseline.  - Continue home meds, augmenting cardizem to 90mg  QID and toprol. Weaned off cardizem gtt.  Plan to consolidate to daily cardizem 360mg .  - Continue eliquis (CHADSVASc is 4).  Acute on chronic diastolic CHF: Mild. CXR w/effusions, mild vascular congestion, and cardiomegaly, though BNP is equivocal with stage III CKD. He is below EDW (190lbs from 196lbs at last clinic visit).   - Gave lasix 80mg  IV BID per cardiology with good diuresis.  - Strict I/O, daily weight  CAD s/p PCI with in-stent restenosis of RCA leaqding to PCI x2 to RCA in 2009 and DES to second OM 2017: Troponin very mildly elevated consistent with demand ischemia due to AFib w/RVR/diastolic CHF. ECG w/nonspecific T wave changes. Though reported as chief complaint, he denies chest pain.  - No intervention per cardiology recommendations - Continue plavix, statin, beta blocker.   Community-acquired pneumonia: Suggested by CXR findings, cough, leukocytosis.  - Continue CTX, azithromycin.  - Monitor blood and sputum cultures.   Hypokalemia. Potassium level 3.2, repleted to 3.5.  - Monitor at discharge.   NIDDM: Well-controlled at baseline, with hyperglycemia on admission. HbA1c 6.3%.  - Held po medications - SSI, augmenting to resistant scale + HS correction  Stage III CKD: Creatinine 1.49 on admission and remained stable, near baseline of 1.3 - 1.5 . - Hold nephrotoxins - Monitor urine output, BMP  COPD: Without evidence of acute exacerbation.  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 Trula Slade.  - No intervention planned. No abdominal pain.  - Follow up as outpatient.   Discharge Instructions Discharge Instructions    (HEART FAILURE PATIENTS) Call MD:  Anytime you have any of the following symptoms: 1) 3 pound weight gain in 24 hours or 5 pounds in 1 week 2) shortness of  breath, with or without a dry hacking cough 3) swelling in the hands, feet or stomach 4) if you have to sleep on extra pillows at night in order to breathe.    Complete by:  As directed    Amb referral to  AFIB Clinic    Complete by:  As directed    Diet - low sodium heart healthy    Complete by:  As directed    Discharge instructions    Complete by:  As directed    You were admitted with AFib with rapid ventricular response and slight volume overload as well as pneumonia and have improved with treatment. You are stable for discharge with the following recommendations:  - START taking diltiazem 360mg  daily (change of dose) to help with rate control - START taking levaquin daily for 4 days starting tomorrow to finish treatment of pneumonia - You will follow up in AFib clinic.  - Call to schedule appointment with your PCP as well.  - If your symptoms return, please seek medical attention right away.     Allergies as of 04/24/2016      Reactions   Contrast Media [iodinated Diagnostic Agents] Anaphylaxis      Medication List    TAKE these medications   ACCU-CHEK AVIVA PLUS test strip Generic drug:  glucose blood CHECK BLOOD SUGAR ONCE DAILY AS NEEDED   accu-chek multiclix lancets Once daily 250.00   atorvastatin 20 MG tablet Commonly known as:  LIPITOR TAKE 1 TABLET BY MOUTH EVERY DAY   clopidogrel 75 MG tablet Commonly known as:  PLAVIX TAKE 1 TABLET BY MOUTH EVERY DAY   diltiazem 360 MG 24 hr capsule Commonly known as:  DILTIAZEM CD Take 1 capsule (360 mg total) by mouth daily. What changed:  medication strength  how much to take   ELIQUIS 5 MG Tabs tablet Generic drug:  apixaban TAKE 1 TABLET (5 MG TOTAL) BY MOUTH 2 (TWO) TIMES DAILY.   furosemide 40 MG tablet Commonly known as:  LASIX Take 2 tablets (80 mg total) by mouth daily.   glimepiride 4 MG tablet Commonly known as:  AMARYL Take 0.5 tablets (2 mg total) by mouth daily with breakfast.   Insulin Pen Needle 29G X 12.7MM Misc 1 each by Does not apply route 1 day or 1 dose.   levalbuterol 45 MCG/ACT inhaler Commonly known as:  XOPENEX HFA Inhale 1-2 puffs into the lungs every 6 (six) hours as needed for  wheezing.   levofloxacin 500 MG tablet Commonly known as:  LEVAQUIN Take 1 tablet (500 mg total) by mouth daily.   metFORMIN 1000 MG tablet Commonly known as:  GLUCOPHAGE TAKE 1 TABLET TWICE DAILY WITH A MEAL   metoprolol 100 MG tablet Commonly known as:  LOPRESSOR Take 1 tablet (100 mg total) by mouth 2 (two) times daily.   NITROSTAT 0.4 MG SL tablet Generic drug:  nitroGLYCERIN PLACE 1 TABLET UNDER TONGUE EVERY 5 MINUTES AS NEEDED What changed:  See the new instructions.   oxyCODONE 5 MG immediate release tablet Commonly known as:  Oxy IR/ROXICODONE Take 1 tablet (5 mg total) by mouth every 6 (six) hours as needed for moderate pain or severe pain.   polyethylene glycol powder powder Commonly known as:  GLYCOLAX/MIRALAX Take 17 g by mouth 2 (two) times daily as needed. What changed:  reasons to take this   Tiotropium Bromide-Olodaterol 2.5-2.5 MCG/ACT Aers Commonly known as:  STIOLTO RESPIMAT Inhale 2 puffs into the lungs daily.  VICTOZA 18 MG/3ML Sopn Generic drug:  liraglutide INJECT 0.3MLS INTO SKIN DAILY      Follow-up Information    Nyoka Cowden, MD. Schedule an appointment as soon as possible for a visit in 1 week(s).   Specialty:  Internal Medicine Contact information: [place] [address] [phone]        Peter Martinique, MD Follow up.   Specialty:  Cardiology Contact information: [address] [place] [address] [phone]          Allergies  Allergen Reactions  . Contrast Media [Iodinated Diagnostic Agents] Anaphylaxis    Consultations:  Cardiology, Dr. Irish Lack  Procedures/Studies: Dg Chest Portable 1 View  Result Date: 04/21/2016 CLINICAL DATA:  Shortness of breath with palpitation EXAM: PORTABLE CHEST 1 VIEW COMPARISON:  01/15/2016, 09/29/2015 FINDINGS: Suspect small bilateral effusions. Bibasilar opacities left greater than right may reflect atelectasis edema or pneumonia. There  is cardiomegaly with mild central congestion. Atherosclerosis. No pneumothorax. IMPRESSION: Suspect small bilateral effusions with left greater than right bibasilar atelectasis, pneumonia or edema. There is cardiomegaly with mild central congestion Electronically Signed   By: Donavan Foil M.D.   On: 04/21/2016 23:47   Subjective: Pt feels well, no exertional dyspnea or chest pain, weaned to room air all day.   Discharge Exam: Vitals:   04/24/16 0758 04/24/16 0957  BP: (!) 120/45 (!) 120/45  Pulse: 64   Resp: 20   Temp: 98.4 F (36.9 C)    General: No distress Cardiovascular: irreg irreg in 80-90s. No murmurs.  Respiratory: Nonlabored ambulating on room air, no crackles.  Extremities: No edema  The results of significant diagnostics from this hospitalization (including imaging, microbiology, ancillary and laboratory) are listed below for reference.    Labs: BNP (last 3 results)  Recent Labs  09/24/15 2038 04/22/16 0354  BNP 155.5* 732.2*   Basic Metabolic Panel:  Recent Labs Lab 04/21/16 2311 04/23/16 0234 04/24/16 0416  NA 139 139 139  K 3.2* 3.5 3.1*  CL 100* 100* 102  CO2 26 27 26   GLUCOSE 210* 163* 187*  BUN 19 20 25*  CREATININE 1.49* 1.46* 1.49*  CALCIUM 9.8 8.8* 8.4*  MG  --   --  2.3   CBC:  Recent Labs Lab 04/21/16 2311 04/23/16 0234  WBC 14.9* 10.3  HGB 13.3 11.4*  HCT 40.1 36.9*  MCV 84.4 85.4  PLT 160 153   Cardiac Enzymes:  Recent Labs Lab 04/22/16 0354 04/22/16 0853 04/22/16 1422  TROPONINI 0.04* 0.03* <0.03   CBG:  Recent Labs Lab 04/23/16 1133 04/23/16 1625 04/23/16 2132 04/24/16 0755 04/24/16 1138  GLUCAP 257* 326* 356* 181* 299*   Hgb A1c  Recent Labs  04/22/16 0853  HGBA1C 6.3*   Thyroid function studies  Recent Labs  04/22/16 0355  TSH 0.424   Urinalysis    Component Value Date/Time   COLORURINE YELLOW 04/22/2016 0309   APPEARANCEUR CLEAR 04/22/2016 0309   LABSPEC 1.025 04/22/2016 0309   PHURINE 5.0  04/22/2016 0309   GLUCOSEU 50 (A) 04/22/2016 0309   HGBUR NEGATIVE 04/22/2016 0309   HGBUR trace-intact 02/16/2007 1038   BILIRUBINUR NEGATIVE 04/22/2016 0309   BILIRUBINUR n 08/30/2015 1045   KETONESUR NEGATIVE 04/22/2016 0309   PROTEINUR 100 (A) 04/22/2016 0309   UROBILINOGEN 1.0 08/30/2015 1045   UROBILINOGEN 1.0 01/14/2010 0629   NITRITE NEGATIVE 04/22/2016 0309   LEUKOCYTESUR NEGATIVE 04/22/2016 0309    Microbiology Recent Results (from the past 240 hour(s))  MRSA PCR  Screening     Status: None   Collection Time: 04/22/16  5:11 AM  Result Value Ref Range Status   MRSA by PCR NEGATIVE NEGATIVE Final    Comment:        The GeneXpert MRSA Assay (FDA approved for NASAL specimens only), is one component of a comprehensive MRSA colonization surveillance program. It is not intended to diagnose MRSA infection nor to guide or monitor treatment for MRSA infections.   Respiratory Panel by PCR     Status: None   Collection Time: 04/22/16 11:04 AM  Result Value Ref Range Status   Adenovirus NOT DETECTED NOT DETECTED Final   Coronavirus 229E NOT DETECTED NOT DETECTED Final   Coronavirus HKU1 NOT DETECTED NOT DETECTED Final   Coronavirus NL63 NOT DETECTED NOT DETECTED Final   Coronavirus OC43 NOT DETECTED NOT DETECTED Final   Metapneumovirus NOT DETECTED NOT DETECTED Final   Rhinovirus / Enterovirus NOT DETECTED NOT DETECTED Final   Influenza A NOT DETECTED NOT DETECTED Final   Influenza B NOT DETECTED NOT DETECTED Final   Parainfluenza Virus 1 NOT DETECTED NOT DETECTED Final   Parainfluenza Virus 2 NOT DETECTED NOT DETECTED Final   Parainfluenza Virus 3 NOT DETECTED NOT DETECTED Final   Parainfluenza Virus 4 NOT DETECTED NOT DETECTED Final   Respiratory Syncytial Virus NOT DETECTED NOT DETECTED Final   Bordetella pertussis NOT DETECTED NOT DETECTED Final   Chlamydophila pneumoniae NOT DETECTED NOT DETECTED Final   Mycoplasma pneumoniae NOT DETECTED NOT DETECTED Final   Culture, sputum-assessment     Status: None   Collection Time: 04/22/16  1:00 PM  Result Value Ref Range Status   Specimen Description EXPECTORATED SPUTUM  Final   Special Requests NONE  Final   Sputum evaluation THIS SPECIMEN IS ACCEPTABLE FOR SPUTUM CULTURE  Final   Report Status 04/22/2016 FINAL  Final  Culture, respiratory (NON-Expectorated)     Status: None   Collection Time: 04/22/16  1:00 PM  Result Value Ref Range Status   Specimen Description EXPECTORATED SPUTUM  Final   Special Requests NONE Reflexed from [id number]  Final   Gram Stain   Final    MODERATE WBC PRESENT, PREDOMINANTLY PMN FEW GRAM POSITIVE COCCI IN PAIRS AND CHAINS RARE GRAM NEGATIVE RODS RARE GRAM POSITIVE RODS FEW GRAM NEGATIVE DIPLOCOCCI    Culture Consistent with normal respiratory flora.  Final   Report Status 04/24/2016 FINAL  Final    Time coordinating discharge: Approximately 40 minutes  Vance Gather, MD  [facility] 04/24/2016, 1:36 PM Pager [phone]

## 2016-04-28 ENCOUNTER — Telehealth: Payer: Self-pay

## 2016-04-28 NOTE — Telephone Encounter (Signed)
LMTCB

## 2016-04-29 NOTE — Telephone Encounter (Signed)
D/C: 04/24/16 To: home  Spoke with pt and he states that he is doing well. He does not have any questions or concerns at this time.  Appt scheduled with Delano Metz, NP as Dr. Inda Merlin is not in office.    Transition Care Management Follow-up Telephone Call  How have you been since you were released from the hospital? Much improved   Do you understand why you were in the hospital? yes   Do you understand the discharge instrcutions? yes  Items Reviewed:  Medications reviewed: yes  Allergies reviewed: yes  Dietary changes reviewed: yes  Referrals reviewed: yes   Functional Questionnaire:   Activities of Daily Living (ADLs):   He states they are independent in the following: ambulation, bathing and hygiene, feeding, continence, grooming, toileting and dressing States they require assistance with the following: none   Any transportation issues/concerns?: no   Any patient concerns? no   Confirmed importance and date/time of follow-up visits scheduled: yes   Confirmed with patient if condition begins to worsen call PCP or go to the ER.  Patient was given the Call-a-Nurse line [phone]: yes

## 2016-04-30 ENCOUNTER — Encounter: Payer: Self-pay | Admitting: Family Medicine

## 2016-04-30 ENCOUNTER — Ambulatory Visit (INDEPENDENT_AMBULATORY_CARE_PROVIDER_SITE_OTHER): Payer: Medicare Other | Admitting: Family Medicine

## 2016-04-30 VITALS — BP 128/74 | HR 86 | Temp 97.7°F | Wt 194.6 lb

## 2016-04-30 DIAGNOSIS — I482 Chronic atrial fibrillation, unspecified: Secondary | ICD-10-CM

## 2016-04-30 DIAGNOSIS — N183 Chronic kidney disease, stage 3 unspecified: Secondary | ICD-10-CM

## 2016-04-30 DIAGNOSIS — I5042 Chronic combined systolic (congestive) and diastolic (congestive) heart failure: Secondary | ICD-10-CM | POA: Diagnosis not present

## 2016-04-30 DIAGNOSIS — I10 Essential (primary) hypertension: Secondary | ICD-10-CM | POA: Diagnosis not present

## 2016-04-30 DIAGNOSIS — J189 Pneumonia, unspecified organism: Secondary | ICD-10-CM | POA: Diagnosis not present

## 2016-04-30 DIAGNOSIS — E1151 Type 2 diabetes mellitus with diabetic peripheral angiopathy without gangrene: Secondary | ICD-10-CM

## 2016-04-30 LAB — CBC WITH DIFFERENTIAL/PLATELET
BASOS ABS: 0.1 10*3/uL (ref 0.0–0.1)
Basophils Relative: 0.8 % (ref 0.0–3.0)
EOS ABS: 0.2 10*3/uL (ref 0.0–0.7)
Eosinophils Relative: 2.2 % (ref 0.0–5.0)
HEMATOCRIT: 40.7 % (ref 39.0–52.0)
HEMOGLOBIN: 13.5 g/dL (ref 13.0–17.0)
LYMPHS PCT: 24.3 % (ref 12.0–46.0)
Lymphs Abs: 1.9 10*3/uL (ref 0.7–4.0)
MCHC: 33.1 g/dL (ref 30.0–36.0)
MCV: 82.5 fl (ref 78.0–100.0)
MONOS PCT: 9.3 % (ref 3.0–12.0)
Monocytes Absolute: 0.7 10*3/uL (ref 0.1–1.0)
NEUTROS ABS: 4.9 10*3/uL (ref 1.4–7.7)
Neutrophils Relative %: 63.4 % (ref 43.0–77.0)
PLATELETS: 308 10*3/uL (ref 150.0–400.0)
RBC: 4.93 Mil/uL (ref 4.22–5.81)
RDW: 16.1 % — ABNORMAL HIGH (ref 11.5–15.5)
WBC: 7.8 10*3/uL (ref 4.0–10.5)

## 2016-04-30 LAB — BASIC METABOLIC PANEL
BUN: 20 mg/dL (ref 6–23)
CALCIUM: 9.7 mg/dL (ref 8.4–10.5)
CO2: 28 mEq/L (ref 19–32)
CREATININE: 1.22 mg/dL (ref 0.40–1.50)
Chloride: 105 mEq/L (ref 96–112)
GFR: 61.47 mL/min (ref >60.00)
Glucose, Bld: 148 mg/dL — ABNORMAL HIGH (ref 70–99)
Potassium: 3.8 mEq/L (ref 3.5–5.1)
SODIUM: 142 meq/L (ref 135–145)

## 2016-04-30 NOTE — Patient Instructions (Addendum)
It was a pleasure to see you today! Please follow up with your cardiologist on Monday as scheduled.  We have ordered labs or studies at this visit. It can take up to 1-2 weeks for results and processing. IF results require follow up or explanation, we will call you with instructions. Clinically stable results will be released to your [facility]. If you have not heard from [place] or cannot find your results in [facility] in 2 weeks please contact our office at [phone].  If you are not yet signed up for [facility], please consider signing up   Keep follow up appointment with Dr. Burnice Logan as we discussed or sooner if needed.  WE NOW OFFER   [facility] FAST TRACK!!!  SAME DAY Appointments for ACUTE CARE  Such as: Sprains, Injuries, cuts, abrasions, rashes, muscle pain, joint pain, back pain Colds, flu, sore throats, headache, allergies, cough, fever  Ear pain, sinus and eye infections Abdominal pain, nausea, vomiting, diarrhea, upset stomach Animal/insect bites  3 Easy Ways to Schedule: Walk-In Scheduling Call in scheduling Mychart Sign-up: [email or web address]

## 2016-04-30 NOTE — Progress Notes (Signed)
Subjective:    Patient ID: Jason Fisher, male    DOB: [birth year], 76 y.o.   MRN: [id number]  HPI  Jason Fisher is a 76 year old male who presents today for a hospital follow up. I am seeing him today in place of his PCP who is unavailable at this time.  Admit date:  04/21/2016 Discharge Date:  04/24/16  PMH:  Chronic Afib, CAD s/p PI, NIDDM, AAA, HTN, chronic diastolic CHF, COPD, stage III CKD who presented to the ED for evaluation of 2 day history of gradually worsening chest pain and SOB.  He was evaluated and found to be hypoxic with AFib w/RVR.  Cardizem infusion was initiated for rate control and a referral for cardiology consult was completed.  He was diuresed with IV lasix with benefit and rate was controlled with oral diltiazem 90 QID with resolution of respiratory failure.  Symptom of acute respiratory failure with hypoxia was noted to be likely related to AFib w/RVR which initiated volume overload.  He was weaned to room air with no dyspnea with exertion.  Recommendations from hospital stay upon discharge: Chronic AFib at baseline:  Continue home meds with augmenting cardizem to 90 mg QID and toprol. Plan is to consolidate daily to cardizem 360 mg (this was completed at discharge).  He is followed by cardiology and has an appointment 05/04/16. Continue eliquis (CHADSVASc is 4)  CHF: Mild; Advised to continue daily weight and CXR w/effusions, noted mild vascular congestion and cardiomegaly.  CAD: ECG w/nonspecific T wave changes and he denied chest pain. Troponin was very mildly elevated consistent with demand ischemia due to AFib w RVR/diastolic CHF. Cardiology recommended to continue plavix, statin, and beta blocker  CAP:  Levaquin prescribed at discharge  Hypokalemia resolved at discharge  NIDDM:  A1C on admission noted at 6.3%  At discharge diltiazem was changed to 360 mg daily to help with rate control Levaquin was prescribed for 4 days to finish treatment of pneumonia He  was referred to AFib clinic Advised to contact heart failure clinic if he has any of the following symptoms: HEART FAILURE PATIENTS) Call MD:  Anytime you have any of the following symptoms: 1) 3 pound weight gain in 24 hours or 5 pounds in 1 week 2) shortness of breath, with or without a dry hacking cough 3) swelling in the hands, feet or stomach 4) if you have to sleep on extra pillows at night in order to breathe.   Today he reports the following: Feeling better, Reports intermittent cough that is intermittently productive of clear mucous; he reports that this has improved greatly.  Denies fever, chills, sweats, SOB, dyspnea, chest pain, palpitations, edema, numbness, tingling, or weakness.  Chronic A-Fib: Maintained on Eliquis, diltiazem,  He denies chest pain, palpitations, SOB, dyspnea on exertion. Reports that climbing stairs can cause SOB that is unchanged from baseline. No SOB with ambulating today in office.  HTN: Maintained on metoprolol. Atorvastatin  Denies use of nitrostat.  BP readings monitored at home prior to hospitalization but has not completed this since he has been home.   DM: Maintained on Victoza, metformin, and glimepiride Blood sugar average is noted as 130s.  He denies polyphagia, polyuria, and polyphagia.   CKD: He denies urinary symptoms; he is monitoring his output; BMP will be obtained today.  CAP: Completed Levaquin.  Denies fever, chills, sweats, congestion, sore throat, sinus pressure/pain, ear pain, fever, chills, sweats. Intermittent cough reported above.  No SOB or dyspnea.  COPD:  Maintained on levalbuterol and tiotropium Bromide-Olodaterol  Chronic combined systolic and diastolic heart failure:  Furosemide 40 mg daily. Weighing daily. No weight gain reported on same scale at home daily.   He is requesting a handicap placard application today.  Review of Systems  Constitutional: Negative for chills, fatigue and fever.  HENT: Negative for congestion,  postnasal drip, rhinorrhea, sinus pain, sinus pressure, sneezing and sore throat.   Respiratory: Negative for cough, shortness of breath and wheezing.   Cardiovascular: Negative for chest pain, palpitations and leg swelling.  Gastrointestinal: Negative for abdominal pain, blood in stool, constipation, diarrhea, nausea and vomiting.  Genitourinary: Negative for dysuria, frequency and urgency.  Skin: Negative for rash.  Neurological: Negative for dizziness, weakness, light-headedness, numbness and headaches.  Psychiatric/Behavioral:       Denies depressed or anxious mood   Past Medical History:  Diagnosis Date  . 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 BENIGN PROSTATIC HYPERTROPHY, HX OF 02/24/2007  . CAD S/P PCI    a. 2007 Inf STEMI s/p Vision BMS  2 to RCA;  b. 2009 ISR Prox RCA Stent-->with DES;  c. 09/2015 PCI: LM nl, LAD 30p/m, D1 90, LCX nl, OM1 small, 80, OM2 90 (2.5x14 Biofreedom stent), OM3 small, RCA 30p ISR, 58m ISR, 6m, 85d, EF 50-55%.  . 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 Chronic atrial fibrillation ([place]) 11/06/2009   a. On Eliquis (CHA2DS2VASc = 5).  Rate controlled w/ BB and CCB.  Marland Kitchen Chronic diastolic CHF (congestive heart failure) ([place])    a. 09/2015 Echo: EF 55-60%, mildly dil LA.  . CKD (chronic kidney disease), stage III   . COLONIC POLYPS, HX OF 11/02/2006  . DIABETES MELLITUS, TYPE II 10/29/2006  . Diverticulosis   . History of tobacco abuse    a. Quit 09/2015.  Marland Kitchen HYPERLIPIDEMIA 10/29/2006  . Hypertensive heart disease with heart failure [facility])      Social History   Social History  . Marital status: Married    Spouse name: N/A  . Number of children: 5  . Years of education: N/A   Occupational History  . [employer]    Social History Main Topics  . Smoking status: Former Smoker    Types: Cigarettes    Quit date: 09/08/2015  . Smokeless tobacco: Never Used     Comment: passive smoker as well  . Alcohol use No  . Drug use: No  . Sexual activity: Not on  file   Other Topics Concern  . Not on file   Social History Narrative  . No narrative on file    Past Surgical History:  Procedure Laterality Date  . CARDIAC CATHETERIZATION N/A 09/26/2015   Procedure: Left Heart Cath and Coronary Angiography;  Surgeon: Peter M Martinique, MD;  Location: [place] [facility];  Service: Cardiovascular;  Laterality: N/A;  . CARDIAC CATHETERIZATION N/A 09/26/2015   Procedure: Coronary Stent Intervention;  Surgeon: Peter M Martinique, MD;  Location: [place] [facility];  Service: Cardiovascular;  Laterality: N/A;  . CATARACT EXTRACTION    . COLONOSCOPY    . CORONARY ANGIOPLASTY WITH STENT PLACEMENT  2007   Inferior STEMI 2007 - RCA PCI with 2 Vision BMS; Abnormal Myoview in 2009 - pRCA ins-stent restenosis & unstented disease - DES PCI Promus 3.0 x 20 mm (3.5 mm)  . INCISION AND DRAINAGE PERIRECTAL ABSCESS N/A 11/06/2015   Procedure: IRRIGATION AND DEBRIDEMENT PERIRECTAL ABSCESS, POSSIBLE HEMORRHOIDECTOMY;  Surgeon: Coralie Keens, MD;  Location: [place];  Service:  General;  Laterality: N/A;  . SHOULDER SURGERY Right     Family History  Problem Relation Age of Onset  . Heart attack Mother   . Diabetes Mother   . Heart attack Father   . Hypertension Father   . Heart attack Brother   . Diabetes Brother     Allergies  Allergen Reactions  . Contrast Media [Iodinated Diagnostic Agents] Anaphylaxis    Current Outpatient Prescriptions on File Prior to Visit  Medication Sig Dispense Refill  . ACCU-CHEK AVIVA PLUS test strip CHECK BLOOD SUGAR ONCE DAILY AS NEEDED 100 each 4  . atorvastatin (LIPITOR) 20 MG tablet TAKE 1 TABLET BY MOUTH EVERY DAY 90 tablet 1  . clopidogrel (PLAVIX) 75 MG tablet TAKE 1 TABLET BY MOUTH EVERY DAY 90 tablet 3  . diltiazem (CARDIZEM CD) 360 MG 24 hr capsule Take 1 capsule (360 mg total) by mouth daily. 30 capsule 0  . ELIQUIS 5 MG TABS tablet TAKE 1 TABLET (5 MG TOTAL) BY MOUTH 2 (TWO) TIMES DAILY. 60 tablet 5  . furosemide (LASIX) 40 MG  tablet Take 2 tablets (80 mg total) by mouth daily. 60 tablet 5  . glimepiride (AMARYL) 4 MG tablet Take 0.5 tablets (2 mg total) by mouth daily with breakfast. 90 tablet 3  . Insulin Pen Needle 29G X 12.7MM MISC 1 each by Does not apply route 1 day or 1 dose. 100 each 3  . Lancets (ACCU-CHEK MULTICLIX) lancets Once daily 250.00 100 each 3  . levalbuterol (XOPENEX HFA) 45 MCG/ACT inhaler Inhale 1-2 puffs into the lungs every 6 (six) hours as needed for wheezing. 1 Inhaler 6  . metFORMIN (GLUCOPHAGE) 1000 MG tablet TAKE 1 TABLET TWICE DAILY WITH A MEAL 180 tablet 1  . metoprolol (LOPRESSOR) 100 MG tablet Take 1 tablet (100 mg total) by mouth 2 (two) times daily. 180 tablet 1  . NITROSTAT 0.4 MG SL tablet PLACE 1 TABLET UNDER TONGUE EVERY 5 MINUTES AS NEEDED (Patient taking differently: PLACE 1 TABLET UNDER TONGUE EVERY 5 MINUTES AS NEEDED FOR CHEST PAIN.) 25 tablet 0  . oxyCODONE (OXY IR/ROXICODONE) 5 MG immediate release tablet Take 1 tablet (5 mg total) by mouth every 6 (six) hours as needed for moderate pain or severe pain. 30 tablet 0  . polyethylene glycol powder (GLYCOLAX/MIRALAX) powder Take 17 g by mouth 2 (two) times daily as needed. (Patient taking differently: Take 17 g by mouth 2 (two) times daily as needed for mild constipation. ) 3350 g 1  . Tiotropium Bromide-Olodaterol (STIOLTO RESPIMAT) 2.5-2.5 MCG/ACT AERS Inhale 2 puffs into the lungs daily. 1 Inhaler 0  . VICTOZA 18 MG/3ML SOPN INJECT 0.3MLS INTO SKIN DAILY 9 mL 1   No current facility-administered medications on file prior to visit.     BP 128/74 (BP Location: Left Arm, Patient Position: Sitting, Cuff Size: Normal)   Pulse 86   Temp 97.7 F (36.5 C) (Oral)   Wt 194 lb 9.6 oz (88.3 kg)   SpO2 95%   BMI 29.59 kg/m       Objective:   Physical Exam  Constitutional: He is oriented to person, place, and time.  HENT:  Right Ear: Tympanic membrane normal.  Left Ear: Tympanic membrane normal.  Nose: No rhinorrhea. Right  sinus exhibits no maxillary sinus tenderness and no frontal sinus tenderness. Left sinus exhibits no maxillary sinus tenderness and no frontal sinus tenderness.  Mouth/Throat: Mucous membranes are normal. No oropharyngeal exudate or posterior oropharyngeal erythema.  Eyes: Pupils  are equal, round, and reactive to light. No scleral icterus.  Neck: Normal range of motion. Neck supple.  Cardiovascular: Normal rate.   Irregular; chronic afib present; rate controlled 86  Pulmonary/Chest: Effort normal and breath sounds normal. He has no wheezes. He has no rales.  Abdominal: Soft. Bowel sounds are normal. There is no tenderness.  Lymphadenopathy:    He has no cervical adenopathy.  Neurological: He is alert and oriented to person, place, and time. Coordination normal.  Skin: Skin is warm and dry. No rash noted.  Psychiatric: He has a normal mood and affect. His behavior is normal. Judgment and thought content normal.       Assessment & Plan:  1. Chronic atrial fibrillation ([place]): CHA2DS2Vasc = 4. Rate control Controlled; rate today 86; continue diltiazem as prescribed.  Follow up with cardiology.  2. Chronic combined systolic and diastolic heart failure (HCC) Continue medications as prescribed by cardiology.  Follow up next week as scheduled on 05/04/16.  3. Essential hypertension Controlled; Continue medications as prescribed by cardiology. Advised monitoring of BP, provided parameters to report, and advised patient and wife to bring BP cuff with them to their next visit for evaluation. - Basic metabolic panel - CBC with Differential/Platelet  4. DM (diabetes mellitus), type 2 with peripheral vascular complications (HCC) Stable; Advised continued home monitoring, Continue with medications as prescribed by PCP.  5. CKD (chronic kidney disease), stage III   6. Community acquired pneumonia, unspecified laterality Improved; we discussed a follow up chest X-ray in one month for evaluation of  resolution of CAP.  He has a scheduled visit with his PCP in one month and will be evaluated at that time.  Will obtain CBC/BMP today for evaluation; follow up in one month with PCP as scheduled or sooner if needed.  Parameters reviewed that were given by cardiology of  Call MD:  Anytime you have any of the following symptoms: 1) 3 pound weight gain in 24 hours or 5 pounds in 1 week 2) shortness of breath, with or without a dry hacking cough 3) swelling in the hands, feet or stomach 4) if you have to sleep on extra pillows at night in order to breathe.   Delano Metz, FNP-C

## 2016-05-01 NOTE — Progress Notes (Signed)
Cardiology Office Note    Date:  05/04/2016   ID:  Jason Fisher, DOB [birth year], MRN [id number]  PCP:  Nyoka Cowden, MD  Cardiologist: Dr. Martinique  Chief Complaint  Patient presents with  . Hospitalization Follow-up    History of Present Illness:    Jason Fisher is a 76 y.o. male with past medical history of CAD (s/p BMS to RCA in 2007, ISR with placement of RCA DES in 2009, Biofreedom DES to OM2 in 09/2015), chronic atrial fibrillation (on Eliquis), chronic diastolic CHF, HTN, HLD, AAA, COPD, and tobacco use who presents to the office today for hospital follow-up.   He was recently admitted from 3/20 - 04/24/2016 for atrial fibrillation with RVR. Troponin values remained flat at 0.04, 0.03, and <0.03.  He was initially placed on IV Cardizem which was switched to Cardizem CD 360mg  daily during admission. He did require IV diuresis for acute on chronic diastolic CHF exacerbation. Weight at time of discharge was 194 lbs with creatinine at 1.49.  In talking with the patient today, he reports doing relatively well since his recent hospitalization. He reports his breathing is at baseline and denies any PND or orthopnea. Does have occasional lower extremity edema. He takes Lasix 40mg  in AM daily but only takes his PM dose 2-3 times per week.   Denies any chest discomfort or palpitations. He remains on Eliquis and Plavix, denying any evidence of active bleeding.  Reports blood pressure readings have been elevated with his home BP cuff, but his blood pressure has been checked by his PCP and is always well-controlled. He doubts the accuracy of his home BP machine. BP is well-controlled at 122/82 during today's visit.    Past Medical History:  Diagnosis Date  . 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 BENIGN PROSTATIC HYPERTROPHY, HX OF 02/24/2007  . CAD S/P PCI    a. 2007 Inf STEMI s/p Vision BMS  2 to RCA;  b. 2009 ISR Prox RCA Stent-->with DES;  c. 09/2015 PCI: LM nl, LAD 30p/m,  D1 90, LCX nl, OM1 small, 80, OM2 90 (2.5x14 Biofreedom stent), OM3 small, RCA 30p ISR, 29m ISR, 66m, 85d, EF 50-55%.  . 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 Chronic atrial fibrillation ([place]) 11/06/2009   a. On Eliquis (CHA2DS2VASc = 5).  Rate controlled w/ BB and CCB.  Marland Kitchen Chronic diastolic CHF (congestive heart failure) ([place])    a. 09/2015 Echo: EF 55-60%, mildly dil LA.  . CKD (chronic kidney disease), stage III   . COLONIC POLYPS, HX OF 11/02/2006  . DIABETES MELLITUS, TYPE II 10/29/2006  . Diverticulosis   . History of tobacco abuse    a. Quit 09/2015.  Marland Kitchen HYPERLIPIDEMIA 10/29/2006  . Hypertensive heart disease with heart failure [facility])     Past Surgical History:  Procedure Laterality Date  . CARDIAC CATHETERIZATION N/A 09/26/2015   Procedure: Left Heart Cath and Coronary Angiography;  Surgeon: Peter M Martinique, MD;  Location: [place] CV LAB;  Service: Cardiovascular;  Laterality: N/A;  . CARDIAC CATHETERIZATION N/A 09/26/2015   Procedure: Coronary Stent Intervention;  Surgeon: Peter M Martinique, MD;  Location: [place] CV LAB;  Service: Cardiovascular;  Laterality: N/A;  . CATARACT EXTRACTION    . COLONOSCOPY    . CORONARY ANGIOPLASTY WITH STENT PLACEMENT  2007   Inferior STEMI 2007 - RCA PCI with 2 Vision BMS; Abnormal Myoview in 2009 - pRCA ins-stent restenosis & unstented disease - DES PCI Promus 3.0 x 20  mm (3.5 mm)  . INCISION AND DRAINAGE PERIRECTAL ABSCESS N/A 11/06/2015   Procedure: IRRIGATION AND DEBRIDEMENT PERIRECTAL ABSCESS, POSSIBLE HEMORRHOIDECTOMY;  Surgeon: Coralie Keens, MD;  Location: [place];  Service: General;  Laterality: N/A;  . SHOULDER SURGERY Right     Current Medications: Outpatient Medications Prior to Visit  Medication Sig Dispense Refill  . atorvastatin (LIPITOR) 20 MG tablet TAKE 1 TABLET BY MOUTH EVERY DAY 90 tablet 1  . clopidogrel (PLAVIX) 75 MG tablet TAKE 1 TABLET BY MOUTH EVERY DAY 90 tablet 3  . ELIQUIS 5 MG TABS tablet TAKE 1 TABLET (5 MG  TOTAL) BY MOUTH 2 (TWO) TIMES DAILY. 60 tablet 5  . furosemide (LASIX) 40 MG tablet Take 2 tablets (80 mg total) by mouth daily. 60 tablet 5  . glimepiride (AMARYL) 4 MG tablet Take 0.5 tablets (2 mg total) by mouth daily with breakfast. 90 tablet 3  . levalbuterol (XOPENEX HFA) 45 MCG/ACT inhaler Inhale 1-2 puffs into the lungs every 6 (six) hours as needed for wheezing. 1 Inhaler 6  . metFORMIN (GLUCOPHAGE) 1000 MG tablet TAKE 1 TABLET TWICE DAILY WITH A MEAL 180 tablet 1  . metoprolol (LOPRESSOR) 100 MG tablet Take 1 tablet (100 mg total) by mouth 2 (two) times daily. 180 tablet 1  . oxyCODONE (OXY IR/ROXICODONE) 5 MG immediate release tablet Take 1 tablet (5 mg total) by mouth every 6 (six) hours as needed for moderate pain or severe pain. 30 tablet 0  . Tiotropium Bromide-Olodaterol (STIOLTO RESPIMAT) 2.5-2.5 MCG/ACT AERS Inhale 2 puffs into the lungs daily. 1 Inhaler 0  . VICTOZA 18 MG/3ML SOPN INJECT 0.3MLS INTO SKIN DAILY 9 mL 1  . diltiazem (CARDIZEM CD) 360 MG 24 hr capsule Take 1 capsule (360 mg total) by mouth daily. 30 capsule 0  . ACCU-CHEK AVIVA PLUS test strip CHECK BLOOD SUGAR ONCE DAILY AS NEEDED 100 each 4  . Insulin Pen Needle 29G X 12.7MM MISC 1 each by Does not apply route 1 day or 1 dose. 100 each 3  . Lancets (ACCU-CHEK MULTICLIX) lancets Once daily 250.00 100 each 3  . NITROSTAT 0.4 MG SL tablet PLACE 1 TABLET UNDER TONGUE EVERY 5 MINUTES AS NEEDED (Patient taking differently: PLACE 1 TABLET UNDER TONGUE EVERY 5 MINUTES AS NEEDED FOR CHEST PAIN.) 25 tablet 0  . polyethylene glycol powder (GLYCOLAX/MIRALAX) powder Take 17 g by mouth 2 (two) times daily as needed. (Patient taking differently: Take 17 g by mouth 2 (two) times daily as needed for mild constipation. ) 3350 g 1   No facility-administered medications prior to visit.      Allergies:   Contrast media [iodinated diagnostic agents]   Social History   Social History  . Marital status: Married    Spouse name:  N/A  . Number of children: 5  . Years of education: N/A   Occupational History  . [employer]    Social History Main Topics  . Smoking status: Former Smoker    Types: Cigarettes    Quit date: 09/08/2015  . Smokeless tobacco: Never Used     Comment: passive smoker as well  . Alcohol use No  . Drug use: No  . Sexual activity: Not Asked   Other Topics Concern  . None   Social History Narrative  . None     Family History:  The patient's family history includes Diabetes in his brother and mother; Heart attack in his brother, father, and mother; Hypertension in his father.  Review of Systems:   Please see the history of present illness.     General:  No chills, fever, night sweats or weight changes.  Cardiovascular:  No chest pain, dyspnea on exertion, orthopnea, palpitations, paroxysmal nocturnal dyspnea. Positive for lower extremity edema.  Dermatological: No rash, lesions/masses Respiratory: No cough, dyspnea Urologic: No hematuria, dysuria Abdominal:   No nausea, vomiting, diarrhea, bright red blood per rectum, melena, or hematemesis Neurologic:  No visual changes, wkns, changes in mental status. All other systems reviewed and are otherwise negative except as noted above.   Physical Exam:    VS:  BP 122/82   Pulse 74   Ht 5\' 8"  (1.727 m)   Wt 197 lb (89.4 kg)   BMI 29.95 kg/m    General: Well developed, well nourished Caucasian male appearing in no acute distress. Head: Normocephalic, atraumatic, sclera non-icteric, no xanthomas, nares are without discharge.  Neck: No carotid bruits. JVD not elevated.  Lungs: Respirations regular and unlabored, without wheezes or rales.  Heart: Irregularly irregular. No S3 or S4.  No murmur, no rubs, or gallops appreciated. Abdomen: Soft, non-tender, non-distended with normoactive bowel sounds. No hepatomegaly. No rebound/guarding. No obvious abdominal masses. Msk:  Strength and tone appear normal for age. No joint deformities or  effusions. Extremities: No clubbing or cyanosis. 1+ pitting edema up to mid-shins bilaterally.  Distal pedal pulses are 2+ bilaterally. Neuro: Alert and oriented X 3. Moves all extremities spontaneously. No focal deficits noted. Psych:  Responds to questions appropriately with a normal affect. Skin: No rashes or lesions noted  Wt Readings from Last 3 Encounters:  05/04/16 197 lb (89.4 kg)  04/30/16 194 lb 9.6 oz (88.3 kg)  04/24/16 194 lb 6.4 oz (88.2 kg)     Studies/Labs Reviewed:   EKG:  EKG is ordered today.  The ekg ordered today demonstrates atrial fibrillation, HR 74, with LVH and TWI along lateral leads.   Recent Labs: 02/05/2016: ALT 14; Pro B Natriuretic peptide (BNP) 289.0 04/22/2016: B Natriuretic Peptide 232.6; TSH 0.424 04/24/2016: Magnesium 2.3 04/30/2016: BUN 20; Creatinine, Ser 1.22; Hemoglobin 13.5; Platelets 308.0; Potassium 3.8; Sodium 142   Lipid Panel    Component Value Date/Time   CHOL 137 09/25/2015 0221   TRIG 64 09/25/2015 0221   HDL 47 09/25/2015 0221   CHOLHDL 2.9 09/25/2015 0221   VLDL 13 09/25/2015 0221   LDLCALC 77 09/25/2015 0221    Additional studies/ records that were reviewed today include:   Cardiac Catheterization: 09/2015  Prox LAD to Mid LAD lesion, 30 %stenosed.  1st Diag lesion, 90 %stenosed.  Ost 1st Mrg to 1st Mrg lesion, 80 %stenosed.  Ost RCA to Prox RCA lesion, 30 %stenosed.  Mid RCA-2 lesion, 40 %stenosed.  Mid RCA-1 lesion, 15 %stenosed.  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  There is mild left ventricular systolic dysfunction.  The left ventricular ejection fraction is 50-55% by visual estimate.  LV end diastolic pressure is moderately elevated.  A drug eluting stent was successfully placed.  2nd Mrg lesion, 90 %stenosed.  Post intervention, there is a 0% residual stenosis.   1. 3 vessel obstructive CAD.     - chronic severe stenosis in the first diagonal    - New 90% stenosis  in a large second OM    - Stents in the proximal and mid RCA are patent    - Aneurysm and stenosis in the PLOM branch of the RCA- the aneurysm is  new compared to 2009 2. Mild LV dysfunction 3. Elevated LVEDP 4. Allergic response to contrast with hives and bronchospasm. 5. Successful stenting of the second OM with a Biofreedom stent.   Plan: continue IV diuresis. Patient received steroids, Benadryl, and IV pepcid for contrast reaction. Continue DAPT for one month then DC ASA. Resume Eliquis in am.   Echocardiogram: 09/2015 Study Conclusions  - Left ventricle: The cavity size was normal. Systolic function was   normal. The estimated ejection fraction was in the range of 55%   to 60%. Wall motion was normal; there were no regional wall   motion abnormalities. Doppler parameters are consistent with   elevated mean left atrial filling pressure. - Left atrium: The atrium was mildly dilated.  Assessment:    1. Chronic diastolic congestive heart failure ([place])   2. Chronic atrial fibrillation ([place]): CHA2DS2Vasc = 5. On Warfarin. Rate control   3. Chronic anticoagulation   4. CAD S/P percutaneous coronary angioplasty   5. Essential hypertension   6. Hyperlipidemia LDL goal <70      Plan:   In order of problems listed above:  1. Chronic Diastolic CHF - recently admitted for acute CHF exacerbation in the setting of atrial fibrillation with RVR. He has been on Lasix 40mg  BID but does not take his PM dose regularly. Weight is up 3 lbs since hospital discharge and he does have lower extremity edema. Lungs are clear on examination.  - recommended he take Lasix 40mg  BID as originally prescribed and weigh himself daily. Sodium and fluid restriction reviewed with the patient. Labs recently checked by PCP on 3/29 and showed an improved creatinine of 1.22.  2. Chronic Atrial Fibrillation/ Chronic Anticoagulation - HR remains stable in the 70's. Continue Cardizem CD 360mg  daily and Lopressor  100mg  BID for rate control.  - This patients CHA2DS2-VASc Score and unadjusted Ischemic Stroke Rate (% per year) is equal to 7.2 % stroke rate/year from a score of 5 (HTN, DM, Vascular, Age (2)). He denies any evidence of active bleeding. Continue with Eliquis for anticoagulation.   3. CAD - s/p BMS to RCA in 2007, ISR with placement of RCA DES in 2009, Biofreedom DES to OM2 in 09/2015. - he denies any recent anginal symptoms.  - continue Plavix, statin, and BB therapy. No ASA secondary to need for Eliquis.   4. HTN - BP well-controlled at 122/82 today.  - patient says he was on Hydralazine in the past but has not been taking it since hospital discharge. With his BP well-controlled at home and during today's visit, he was instructed to remain off of this.  - continue Cardizem and Lopressor.   5. HLD - Lipid Panel in 09/2015 showed total cholesterol 137, HDL 47, and LDL 77. - continue Atorvastatin 20mg  daily. Needs repeat Lipid Panel with next fasting labs.   Medication Adjustments/Labs and Tests Ordered: Current medicines are reviewed at length with the patient today.  Concerns regarding medicines are outlined above.  Medication changes, Labs and Tests ordered today are listed in the Patient Instructions below. Patient Instructions  Medication Instructions: STOP Hydralazine  Follow-Up: Your physician recommends that you schedule a follow-up appointment in: 4 months with Dr. Martinique.    Arna Medici, PA  05/04/2016 8:04 PM    [facility] [address], [place], [place]  [address] Phone: [phone]; Fax: [phone]  [address] [place], [place] [address] Phone: [phone]

## 2016-05-04 ENCOUNTER — Ambulatory Visit (INDEPENDENT_AMBULATORY_CARE_PROVIDER_SITE_OTHER): Payer: Medicare Other | Admitting: Student

## 2016-05-04 ENCOUNTER — Encounter: Payer: Self-pay | Admitting: Student

## 2016-05-04 VITALS — BP 122/82 | HR 74 | Ht 68.0 in | Wt 197.0 lb

## 2016-05-04 DIAGNOSIS — I482 Chronic atrial fibrillation, unspecified: Secondary | ICD-10-CM

## 2016-05-04 DIAGNOSIS — Z7901 Long term (current) use of anticoagulants: Secondary | ICD-10-CM | POA: Diagnosis not present

## 2016-05-04 DIAGNOSIS — Z9861 Coronary angioplasty status: Secondary | ICD-10-CM | POA: Diagnosis not present

## 2016-05-04 DIAGNOSIS — I10 Essential (primary) hypertension: Secondary | ICD-10-CM

## 2016-05-04 DIAGNOSIS — I2510 Atherosclerotic heart disease of native coronary artery without angina pectoris: Secondary | ICD-10-CM

## 2016-05-04 DIAGNOSIS — E785 Hyperlipidemia, unspecified: Secondary | ICD-10-CM

## 2016-05-04 DIAGNOSIS — I5032 Chronic diastolic (congestive) heart failure: Secondary | ICD-10-CM

## 2016-05-04 MED ORDER — DILTIAZEM HCL ER COATED BEADS 360 MG PO CP24: 360.0000 mg | ORAL_CAPSULE | Freq: Every day | ORAL | 0 refills | Status: DC

## 2016-05-04 MED ORDER — NITROGLYCERIN 0.4 MG SL SUBL: 0.4000 mg | SUBLINGUAL_TABLET | SUBLINGUAL | 4 refills | Status: DC | PRN

## 2016-05-04 MED ORDER — DILTIAZEM HCL ER COATED BEADS 360 MG PO CP24: 360.0000 mg | ORAL_CAPSULE | Freq: Every day | ORAL | 3 refills | Status: DC

## 2016-05-04 NOTE — Patient Instructions (Signed)
Medication Instructions: STOP HCTZ (Hydrochlorothiazide)   Follow-Up: Your physician recommends that you schedule a follow-up appointment in: 4 months with Dr. Martinique.

## 2016-05-07 ENCOUNTER — Telehealth: Payer: Self-pay | Admitting: Surgery

## 2016-05-07 NOTE — Telephone Encounter (Signed)
I scheduled an appt for CTA abd/pelvis at [facility] Radiology Dept at patient's request. The appt date is 06/18/16 at 10:30am. The patient is to arrive at 10am for bloodwork prior. The patient has an allergy to contrast used for this CTA so I have asked our clinical team to place a Rx to [facility]. He is also scheduled to see Dr.Brabham on 06/22/16 at 1:15pm. I left a detailed VM for the patient regarding these appts and also mailed a letter with the information as well. awt

## 2016-05-08 ENCOUNTER — Other Ambulatory Visit: Payer: Self-pay | Admitting: Pharmacist

## 2016-05-08 ENCOUNTER — Other Ambulatory Visit: Payer: Self-pay

## 2016-05-08 DIAGNOSIS — Z91041 Radiographic dye allergy status: Secondary | ICD-10-CM

## 2016-05-08 DIAGNOSIS — I714 Abdominal aortic aneurysm, without rupture, unspecified: Secondary | ICD-10-CM

## 2016-05-08 MED ORDER — PREDNISONE 50 MG PO TABS: ORAL_TABLET | ORAL | 0 refills | Status: DC

## 2016-05-08 MED ORDER — DIPHENHYDRAMINE HCL 50 MG PO CAPS: ORAL_CAPSULE | ORAL | 0 refills | Status: DC

## 2016-05-08 NOTE — Progress Notes (Signed)
Ordered Prednisone and Benadryl 13 hr. Prep for pt. to take prior to CTA Abd/ Pelvis scheduled on 06/18/16.  Phone call to pt.; spoke with wife; advised of the above medication ordered at his pharmacy.  Encouraged to call if any questions. Wife verb. understanding.

## 2016-05-12 NOTE — Patient Outreach (Signed)
[facility]) Care Management  [place]   05/12/2016  Jason Fisher 05-20-1940 [id number]  Subjective: Patient is a 76 year old male referred for medication reconciliation post discharge. The review was performed today. Patient was admitted 3/20-3/23 for atrial fibrillation with RVR. He was additionally found to have CAP and was treated with levofloxacin 500 mg for 4 days after discharge. Diltiazem was added at discharge to control his atrial fibrillation. He was diuresed with IV furosemide and discharged on furosemide 40 mg BID. At follow up with Hydralazine was discontinued during this admission due to controlled blood pressures.    We called the patient and performed a medication review. He notes that he feels his atrial fibrillation is much better controlled with the addition of diltiazem, and has not noticed any symptoms of shortness of breath or palpitations. He denies any abnormal bleeding with Eliquis. He has been taking his furosemide 40 mg once daily in the morning, and has not needed his second PRN dose in the afternoon. He does not recall ever having taken a higher dose of atorvastatin or other statin medications. He reports systolic blood pressures in the 130s and heart rates in the 80s.    In regards to his diabetes, he reports morning fasting blood sugars around 150. He denies any symptoms of hypoglycemia, but has experienced episodes in the past and knows what to watch for (dizziness, lightheadedness, sweating, etc).   For his COPD, he notes that he has stopped taking Stiolto. He reports trying it for a month but did not notice any difference in symptoms. He only needs to take the Xopenex rescue inhaler when engaging in exertional activities.    He has not needed his oxycodone or Miralax recently. He does not remember what kind of pain the oxycodone was prescribed to him for.    He uses a pill box to sort his medications, and reports missing doses very  frequently, less than one dose per week. His wife helps him with his medications. He noted that in past years when he reached the [facility], he had problems affording his Eliquis and Victoza.  Patient was recently discharged from hospital and all medications have been reviewed.   Objective:   Encounter Medications: Outpatient Encounter Prescriptions as of 05/08/2016  Medication Sig Note  . atorvastatin (LIPITOR) 20 MG tablet TAKE 1 TABLET BY MOUTH EVERY DAY   . clopidogrel (PLAVIX) 75 MG tablet TAKE 1 TABLET BY MOUTH EVERY DAY   . diltiazem (CARDIZEM CD) 360 MG 24 hr capsule Take 1 capsule (360 mg total) by mouth daily.   Marland Kitchen ELIQUIS 5 MG TABS tablet TAKE 1 TABLET (5 MG TOTAL) BY MOUTH 2 (TWO) TIMES DAILY.   . furosemide (LASIX) 40 MG tablet Take 2 tablets (80 mg total) by mouth daily. (Patient taking differently: Take 40 mg by mouth daily. ) 05/08/2016: 40 mg daily and 40 mg every evening as needed  . glimepiride (AMARYL) 4 MG tablet Take 0.5 tablets (2 mg total) by mouth daily with breakfast.   . levalbuterol (XOPENEX HFA) 45 MCG/ACT inhaler Inhale 1-2 puffs into the lungs every 6 (six) hours as needed for wheezing.   . metFORMIN (GLUCOPHAGE) 1000 MG tablet TAKE 1 TABLET TWICE DAILY WITH A MEAL   . metoprolol (LOPRESSOR) 100 MG tablet Take 1 tablet (100 mg total) by mouth 2 (two) times daily.   . nitroGLYCERIN (NITROSTAT) 0.4 MG SL tablet Place 1 tablet (0.4 mg total) under the tongue every  5 (five) minutes as needed for chest pain. X 3 doses   . polyethylene glycol (MIRALAX / GLYCOLAX) packet Take 17 g by mouth daily as needed for mild constipation.   Marland Kitchen VICTOZA 18 MG/3ML SOPN INJECT 0.3MLS INTO SKIN DAILY   . diphenhydrAMINE (BENADRYL) 50 MG capsule Take one capsule 1 hr. prior to CTA Abd/ Pelvis on 06/18/16 (to be taken with final dose of Prednisone) (Patient not taking: Reported on 05/08/2016)   . oxyCODONE (OXY IR/ROXICODONE) 5 MG immediate release tablet Take 1 tablet (5 mg total) by mouth every  6 (six) hours as needed for moderate pain or severe pain. (Patient not taking: Reported on 05/08/2016)   . predniSONE (DELTASONE) 50 MG tablet Take one tab @ 13 hrs prior, 7 hrs prior, and 1 hr. prior to CTA Abdomen and Pelvis on 06/18/16 (Patient not taking: Reported on 05/08/2016)   . Tiotropium Bromide-Olodaterol (STIOLTO RESPIMAT) 2.5-2.5 MCG/ACT AERS Inhale 2 puffs into the lungs daily. (Patient not taking: Reported on 05/08/2016)    No facility-administered encounter medications on file as of 05/08/2016.     Functional Status: In your present state of health, do you have any difficulty performing the following activities: 04/22/2016 11/04/2015  Hearing? N -  Vision? N -  Difficulty concentrating or making decisions? N -  Walking or climbing stairs? Y -  Dressing or bathing? N -  Doing errands, shopping? N N  Preparing Food and eating ? - -  Using the Toilet? - -  In the past six months, have you accidently leaked urine? - -  Do you have problems with loss of bowel control? - -  Managing your Medications? - -  Managing your Finances? - -  Housekeeping or managing your Housekeeping? - -  Some recent data might be hidden    Fall/Depression Screening: PHQ 2/9 Scores 10/11/2015 04/22/2015 03/07/2014 10/31/2013 10/07/2012  PHQ - 2 Score 0 0 0 0 0    Assessment: Drugs sorted by system: Cardiovascular: atorvastatin, clopidogrel, diltiazem, eliquis, furosemide, metoprolol Pulmonary/Allergy: Xopenex  Endocrine: Metformin, glimepiride, Victoza Pain: oxycodone Miscellaneous: Miralax    Duplications in therapy: None   Gaps in therapy: COPD currently treated    Medications to avoid in the elderly:  -Glimepiride    Drug interactions:  -Diltiazem and atorvastatin - Increases concentrations of atorvastatin, monitor for side effects -Diltiazem and Eliquis - Increases Eliquis AUC by 1.5 fold: Monitor for bleeding   Other issues noted:  -Statin intensity: With diabetes, patient qualifies for high  intensity statin. Per AACE 2017 Hyperlipidemia guidelines, patient is Very High Risk (diabetes with advanced age, recent tobacco use) and qualifies for an LDL goal of <70. With a recent LDL of 77, increasing to atorvastatin 40 mg to target high intensity LDL lowering could be considered. 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 Change in FEV1 of >12% is an indicator of Asthma COPD Overlap Syndrome, and indicates that patient could benefit from a regimen that includes an inhaled corticosteroid.  Patient only currently taking albuterol.    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  If believed to be reversible asthma COPD overlap patient may be candidate for LABA or LAMA plus corticosteroid.   -Patient and his wife were counseled to contact [facility] if they encounter problems affording  their medications. -[facility] will not open case at this time.  Please reconsult if needed.    Bennye Alm, PharmD, [place] PGY2 Pharmacy Resident [phone]

## 2016-05-24 ENCOUNTER — Other Ambulatory Visit: Payer: Self-pay | Admitting: Internal Medicine

## 2016-05-29 ENCOUNTER — Encounter: Payer: Self-pay | Admitting: Internal Medicine

## 2016-05-29 ENCOUNTER — Ambulatory Visit (INDEPENDENT_AMBULATORY_CARE_PROVIDER_SITE_OTHER): Payer: Medicare Other | Admitting: Internal Medicine

## 2016-05-29 VITALS — BP 132/64 | HR 68 | Temp 97.4°F | Ht 68.0 in | Wt 197.6 lb

## 2016-05-29 DIAGNOSIS — J42 Unspecified chronic bronchitis: Secondary | ICD-10-CM

## 2016-05-29 DIAGNOSIS — E785 Hyperlipidemia, unspecified: Secondary | ICD-10-CM

## 2016-05-29 DIAGNOSIS — I5043 Acute on chronic combined systolic (congestive) and diastolic (congestive) heart failure: Secondary | ICD-10-CM

## 2016-05-29 DIAGNOSIS — N183 Chronic kidney disease, stage 3 unspecified: Secondary | ICD-10-CM

## 2016-05-29 DIAGNOSIS — I10 Essential (primary) hypertension: Secondary | ICD-10-CM | POA: Diagnosis not present

## 2016-05-29 MED ORDER — DILTIAZEM HCL ER COATED BEADS 360 MG PO CP24: 360.0000 mg | ORAL_CAPSULE | Freq: Every day | ORAL | 4 refills | Status: DC

## 2016-05-29 NOTE — Progress Notes (Signed)
Subjective:    Patient ID: Jason Fisher, male    DOB: 1940-03-26, 76 y.o.   MRN: [id number]  HPI 76 year old patient seen today in follow-up.  He was noted last month with atrial fibrillation with rapid ventricular response and decompensated diastolic heart failure.  He is also treated for possible community-acquired pneumonia. He was also hospitalized in October 2017 for a perirectal abscess He was hospitalized in August 2017 with unstable angina and underwent stenting at that time.  He is also treated for community-acquired pneumonia at that time as well as diastolic heart failure  Since his discharge, he has seen cardiology in follow-up.  He remains quite stable.  He states that he feels better today than he has in some time  Hospital records reviewed  Past Medical History:  Diagnosis Date  . 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 BENIGN PROSTATIC HYPERTROPHY, HX OF 02/24/2007  . CAD S/P PCI    a. 2007 Inf STEMI s/p Vision BMS  2 to RCA;  b. 2009 ISR Prox RCA Stent-->with DES;  c. 09/2015 PCI: LM nl, LAD 30p/m, D1 90, LCX nl, OM1 small, 80, OM2 90 (2.5x14 Biofreedom stent), OM3 small, RCA 30p ISR, 60m ISR, 65m, 85d, EF 50-55%.  . 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 Chronic atrial fibrillation ([place]) 11/06/2009   a. On Eliquis (CHA2DS2VASc = 5).  Rate controlled w/ BB and CCB.  Marland Kitchen Chronic diastolic CHF (congestive heart failure) ([place])    a. 09/2015 Echo: EF 55-60%, mildly dil LA.  . CKD (chronic kidney disease), stage III   . COLONIC POLYPS, HX OF 11/02/2006  . DIABETES MELLITUS, TYPE II 10/29/2006  . Diverticulosis   . History of tobacco abuse    a. Quit 09/2015.  Marland Kitchen HYPERLIPIDEMIA 10/29/2006  . Hypertensive heart disease with heart failure [facility])      Social History   Social History  . Marital status: Married    Spouse name: N/A  . Number of children: 5  . Years of education: N/A   Occupational History  . [employer]    Social History Main Topics  . Smoking status:  Former Smoker    Types: Cigarettes    Quit date: 09/08/2015  . Smokeless tobacco: Never Used     Comment: passive smoker as well  . Alcohol use No  . Drug use: No  . Sexual activity: Not on file   Other Topics Concern  . Not on file   Social History Narrative  . No narrative on file    Past Surgical History:  Procedure Laterality Date  . CARDIAC CATHETERIZATION N/A 09/26/2015   Procedure: Left Heart Cath and Coronary Angiography;  Surgeon: Arber Wiemers M Martinique, MD;  Location: [place] [facility];  Service: Cardiovascular;  Laterality: N/A;  . CARDIAC CATHETERIZATION N/A 09/26/2015   Procedure: Coronary Stent Intervention;  Surgeon: Ephriam Turman M Martinique, MD;  Location: [place] [facility];  Service: Cardiovascular;  Laterality: N/A;  . CATARACT EXTRACTION    . COLONOSCOPY    . CORONARY ANGIOPLASTY WITH STENT PLACEMENT  2007   Inferior STEMI 2007 - RCA PCI with 2 Vision BMS; Abnormal Myoview in 2009 - pRCA ins-stent restenosis & unstented disease - DES PCI Promus 3.0 x 20 mm (3.5 mm)  . INCISION AND DRAINAGE PERIRECTAL ABSCESS N/A 11/06/2015   Procedure: IRRIGATION AND DEBRIDEMENT PERIRECTAL ABSCESS, POSSIBLE HEMORRHOIDECTOMY;  Surgeon: Coralie Keens, MD;  Location: [place];  Service: General;  Laterality: N/A;  . SHOULDER SURGERY Right  Family History  Problem Relation Age of Onset  . Heart attack Mother   . Diabetes Mother   . Heart attack Father   . Hypertension Father   . Heart attack Brother   . Diabetes Brother     Allergies  Allergen Reactions  . Contrast Media [Iodinated Diagnostic Agents] Anaphylaxis    Current Outpatient Prescriptions on File Prior to Visit  Medication Sig Dispense Refill  . atorvastatin (LIPITOR) 20 MG tablet TAKE 1 TABLET BY MOUTH EVERY DAY 90 tablet 1  . clopidogrel (PLAVIX) 75 MG tablet TAKE 1 TABLET BY MOUTH EVERY DAY 90 tablet 3  . diphenhydrAMINE (BENADRYL) 50 MG capsule Take one capsule 1 hr. prior to CTA Abd/ Pelvis on 06/18/16 (to be taken with  final dose of Prednisone) 1 capsule 0  . ELIQUIS 5 MG TABS tablet TAKE 1 TABLET (5 MG TOTAL) BY MOUTH 2 (TWO) TIMES DAILY. 60 tablet 5  . furosemide (LASIX) 40 MG tablet Take 2 tablets (80 mg total) by mouth daily. (Patient taking differently: Take 40 mg by mouth daily. ) 60 tablet 5  . glimepiride (AMARYL) 4 MG tablet Take 0.5 tablets (2 mg total) by mouth daily with breakfast. 90 tablet 3  . levalbuterol (XOPENEX HFA) 45 MCG/ACT inhaler Inhale 1-2 puffs into the lungs every 6 (six) hours as needed for wheezing. 1 Inhaler 6  . metFORMIN (GLUCOPHAGE) 1000 MG tablet TAKE 1 TABLET TWICE DAILY WITH A MEAL 180 tablet 1  . metoprolol (LOPRESSOR) 100 MG tablet Take 1 tablet (100 mg total) by mouth 2 (two) times daily. 180 tablet 1  . nitroGLYCERIN (NITROSTAT) 0.4 MG SL tablet Place 1 tablet (0.4 mg total) under the tongue every 5 (five) minutes as needed for chest pain. X 3 doses 25 tablet 4  . oxyCODONE (OXY IR/ROXICODONE) 5 MG immediate release tablet Take 1 tablet (5 mg total) by mouth every 6 (six) hours as needed for moderate pain or severe pain. 30 tablet 0  . polyethylene glycol (MIRALAX / GLYCOLAX) packet Take 17 g by mouth daily as needed for mild constipation.    . predniSONE (DELTASONE) 50 MG tablet Take one tab @ 13 hrs prior, 7 hrs prior, and 1 hr. prior to CTA Abdomen and Pelvis on 06/18/16 3 tablet 0  . Tiotropium Bromide-Olodaterol (STIOLTO RESPIMAT) 2.5-2.5 MCG/ACT AERS Inhale 2 puffs into the lungs daily. 1 Inhaler 0  . VICTOZA 18 MG/3ML SOPN INJECT 0.3MLS INTO SKIN DAILY 9 mL 1   No current facility-administered medications on file prior to visit.     BP 132/64 (BP Location: Left Arm, Patient Position: Sitting, Cuff Size: Normal)   Pulse 68   Temp 97.4 F (36.3 C) (Oral)   Ht 5\' 8"  (1.727 m)   Wt 197 lb 9.6 oz (89.6 kg)   SpO2 96%   BMI 30.04 kg/m      Review of Systems     Objective:   Physical Exam  Constitutional: He is oriented to person, place, and time. He appears  well-developed.  Weight 197 Blood pressure well controlled O2 sats ration 96  HENT:  Head: Normocephalic.  Right Ear: External ear normal.  Left Ear: External ear normal.  Eyes: Conjunctivae and EOM are normal.  Neck: Normal range of motion.  Cardiovascular: Normal rate and normal heart sounds.   Controlled ventricular response  Pulmonary/Chest: Effort normal.  Diminished breath sounds but clear  Abdominal: Bowel sounds are normal.  Musculoskeletal: Normal range of motion. He exhibits no edema or  tenderness.  No edema  Neurological: He is alert and oriented to person, place, and time.  Psychiatric: He has a normal mood and affect. His behavior is normal.          Assessment & Plan:   Chronic atrial fibrillation.  We'll continue rate control and chronic anticoagulation Diabetes mellitus.  Recent hemoglobin A1c, well-controlled.  No change in therapy Chronic anticoagulation Chronic diastolic heart failure, compensated  Follow cardiology Recheck here 3 months Continue low-salt diet and present regimen  Nyoka Cowden

## 2016-05-29 NOTE — Progress Notes (Signed)
Pre visit review using our clinic review tool, if applicable. No additional management support is needed unless otherwise documented below in the visit note. 

## 2016-05-29 NOTE — Patient Instructions (Addendum)
WE NOW OFFER   [facility] FAST TRACK!!!  SAME DAY Appointments for ACUTE CARE  Such as: Sprains, Injuries, cuts, abrasions, rashes, muscle pain, joint pain, back pain Colds, flu, sore throats, headache, allergies, cough, fever  Ear pain, sinus and eye infections Abdominal pain, nausea, vomiting, diarrhea, upset stomach Animal/insect bites  3 Easy Ways to Schedule: Walk-In Scheduling Call in scheduling Mychart Sign-up: [email or web address]   Limit your sodium (Salt) intake   Please check your hemoglobin A1c every 3 months    It is important that you exercise regularly, at least 20 minutes 3 to 4 times per week.  If you develop chest pain or shortness of breath seek  medical attention.

## 2016-05-30 ENCOUNTER — Other Ambulatory Visit: Payer: Self-pay | Admitting: Internal Medicine

## 2016-06-08 ENCOUNTER — Ambulatory Visit: Payer: Medicare Other | Admitting: Emergency Medicine

## 2016-06-12 ENCOUNTER — Other Ambulatory Visit: Payer: Self-pay

## 2016-06-12 DIAGNOSIS — I714 Abdominal aortic aneurysm, without rupture, unspecified: Secondary | ICD-10-CM

## 2016-06-16 ENCOUNTER — Encounter: Payer: Self-pay | Admitting: Surgery

## 2016-06-17 DIAGNOSIS — M79672 Pain in left foot: Secondary | ICD-10-CM | POA: Diagnosis not present

## 2016-06-17 DIAGNOSIS — M79671 Pain in right foot: Secondary | ICD-10-CM | POA: Diagnosis not present

## 2016-06-17 DIAGNOSIS — B351 Tinea unguium: Secondary | ICD-10-CM | POA: Diagnosis not present

## 2016-06-17 DIAGNOSIS — I739 Peripheral vascular disease, unspecified: Secondary | ICD-10-CM | POA: Diagnosis not present

## 2016-06-18 ENCOUNTER — Ambulatory Visit (HOSPITAL_COMMUNITY)
Admission: RE | Admit: 2016-06-18 | Discharge: 2016-06-18 | Disposition: A | Discharge status: Home / Self Care | Payer: Medicare Other | Source: Ambulatory Visit | Attending: Surgery | Admitting: Surgery

## 2016-06-18 DIAGNOSIS — K7460 Unspecified cirrhosis of liver: Secondary | ICD-10-CM | POA: Insufficient documentation

## 2016-06-18 DIAGNOSIS — J479 Bronchiectasis, uncomplicated: Secondary | ICD-10-CM | POA: Insufficient documentation

## 2016-06-18 DIAGNOSIS — J9811 Atelectasis: Secondary | ICD-10-CM | POA: Insufficient documentation

## 2016-06-18 DIAGNOSIS — N200 Calculus of kidney: Secondary | ICD-10-CM | POA: Insufficient documentation

## 2016-06-18 DIAGNOSIS — I708 Atherosclerosis of other arteries: Secondary | ICD-10-CM | POA: Insufficient documentation

## 2016-06-18 DIAGNOSIS — I714 Abdominal aortic aneurysm, without rupture, unspecified: Secondary | ICD-10-CM

## 2016-06-18 DIAGNOSIS — I513 Intracardiac thrombosis, not elsewhere classified: Secondary | ICD-10-CM | POA: Diagnosis not present

## 2016-06-18 DIAGNOSIS — N281 Cyst of kidney, acquired: Secondary | ICD-10-CM | POA: Diagnosis not present

## 2016-06-18 LAB — POCT I-STAT CREATININE: Creatinine, Ser: 1.1 mg/dL (ref 0.61–1.24)

## 2016-06-18 MED ORDER — IOPAMIDOL (ISOVUE-370) INJECTION 76%
INTRAVENOUS | Status: AC
Start: 2016-06-18 — End: 2016-06-18
  Administered 2016-06-18: 100 mL
  Filled 2016-06-18: qty 100

## 2016-06-22 ENCOUNTER — Encounter: Payer: Self-pay | Admitting: Surgery

## 2016-06-22 ENCOUNTER — Ambulatory Visit: Payer: Medicare Other | Admitting: Surgery

## 2016-06-22 ENCOUNTER — Ambulatory Visit (INDEPENDENT_AMBULATORY_CARE_PROVIDER_SITE_OTHER): Payer: Medicare Other | Admitting: Surgery

## 2016-06-22 VITALS — BP 151/92 | HR 78 | Temp 96.9°F | Resp 18 | Ht 70.0 in | Wt 194.0 lb

## 2016-06-22 DIAGNOSIS — I714 Abdominal aortic aneurysm, without rupture, unspecified: Secondary | ICD-10-CM

## 2016-06-22 NOTE — Progress Notes (Signed)
[facility] and [facility]in Specialist of [facility]  Patient name: Jason Fisher MRN: [id number] DOB: January 10, 1941 Sex: male   REASON FOR VISIT:    F/u AAA  HISOTRY OF PRESENT ILLNESS:    CHRISTYAN Fisher is a 76 y.o. male returns today for follow-up of his abdominal aortic aneurysm.  The patient was admitted in August 4132 with diastolic heart failure. He underwent stenting of the second obtuse marginal. He developed pneumonia after his intervention. He has a history of STEMI in 2007 requiring stenting 2. He had in-stent stenosis in 2009 which was treated with drug-eluting stent. He is medically managed for hypertension with an ACE inhibitor. He takes a statin for hypercholesterolemia. The patient is a diabetic his most recent hemoglobin A1c was 6.6. He is a former smoker. He does have chronic renal insufficiency. He has a history of atrial fibrillation for which he takes Eliquis. He is also on Plavix.    He was recently hospitalized for atrial fibrillation exacerbation.  PAST MEDICAL HISTORY:   Past Medical History:  Diagnosis Date  . 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 BENIGN PROSTATIC HYPERTROPHY, HX OF 02/24/2007  . CAD S/P PCI    a. 2007 Inf STEMI s/p Vision BMS  2 to RCA;  b. 2009 ISR Prox RCA Stent-->with DES;  c. 09/2015 PCI: LM nl, LAD 30p/m, D1 90, LCX nl, OM1 small, 80, OM2 90 (2.5x14 Biofreedom stent), OM3 small, RCA 30p ISR, 64m ISR, 55m, 85d, EF 50-55%.  . 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 Chronic atrial fibrillation ([place]) 11/06/2009   a. On Eliquis (CHA2DS2VASc = 5).  Rate controlled w/ BB and CCB.  Marland Kitchen Chronic diastolic CHF (congestive heart failure) ([place])    a. 09/2015 Echo: EF 55-60%, mildly dil LA.  . CKD (chronic kidney disease), stage III   . COLONIC POLYPS, HX OF 11/02/2006  . DIABETES MELLITUS, TYPE II 10/29/2006  . Diverticulosis   . History of tobacco abuse    a. Quit 09/2015.  Marland Kitchen HYPERLIPIDEMIA 10/29/2006  .  Hypertensive heart disease with heart failure ([place])      FAMILY HISTORY:   Family History  Problem Relation Age of Onset  . Heart attack Mother   . Diabetes Mother   . Heart attack Father   . Hypertension Father   . Heart attack Brother   . Diabetes Brother     SOCIAL HISTORY:   Social History  Substance Use Topics  . Smoking status: Former Smoker    Types: Cigarettes    Quit date: 09/08/2015  . Smokeless tobacco: Never Used     Comment: passive smoker as well  . Alcohol use No     ALLERGIES:   Allergies  Allergen Reactions  . Contrast Media [Iodinated Diagnostic Agents] Anaphylaxis     CURRENT MEDICATIONS:   Current Outpatient Prescriptions  Medication Sig Dispense Refill  . atorvastatin (LIPITOR) 20 MG tablet TAKE 1 TABLET BY MOUTH EVERY DAY 90 tablet 1  . clopidogrel (PLAVIX) 75 MG tablet TAKE 1 TABLET BY MOUTH EVERY DAY 90 tablet 3  . [facility] ALLERGY 25 MG tablet TAKE 2 TABLETS 1 HOUR PRIOR TO CTA ON 06/18/16. TAKE WITH FINAL DOSE OF PREDNISONE  0  . diltiazem (CARDIZEM CD) 360 MG 24 hr capsule Take 1 capsule (360 mg total) by mouth daily. 90 capsule 4  . diphenhydrAMINE (BENADRYL) 50 MG capsule Take one capsule 1 hr. prior to CTA Abd/ Pelvis on 06/18/16 (to be taken with final dose of Prednisone)  1 capsule 0  . ELIQUIS 5 MG TABS tablet TAKE 1 TABLET (5 MG TOTAL) BY MOUTH 2 (TWO) TIMES DAILY. 60 tablet 5  . furosemide (LASIX) 40 MG tablet Take 2 tablets (80 mg total) by mouth daily. (Patient taking differently: Take 40 mg by mouth daily. ) 60 tablet 5  . glimepiride (AMARYL) 4 MG tablet Take 0.5 tablets (2 mg total) by mouth daily with breakfast. 90 tablet 3  . levalbuterol (XOPENEX HFA) 45 MCG/ACT inhaler Inhale 1-2 puffs into the lungs every 6 (six) hours as needed for wheezing. 1 Inhaler 6  . metFORMIN (GLUCOPHAGE) 1000 MG tablet TAKE 1 TABLET TWICE DAILY WITH A MEAL 180 tablet 1  . metFORMIN (GLUCOPHAGE) 1000 MG tablet TAKE 1 TABLET TWICE DAILY WITH A MEAL 180  tablet 2  . metoprolol (LOPRESSOR) 100 MG tablet Take 1 tablet (100 mg total) by mouth 2 (two) times daily. 180 tablet 1  . nitroGLYCERIN (NITROSTAT) 0.4 MG SL tablet Place 1 tablet (0.4 mg total) under the tongue every 5 (five) minutes as needed for chest pain. X 3 doses 25 tablet 4  . polyethylene glycol (MIRALAX / GLYCOLAX) packet Take 17 g by mouth daily as needed for mild constipation.    . Tiotropium Bromide-Olodaterol (STIOLTO RESPIMAT) 2.5-2.5 MCG/ACT AERS Inhale 2 puffs into the lungs daily. 1 Inhaler 0  . VICTOZA 18 MG/3ML SOPN INJECT 0.3MLS INTO SKIN DAILY 9 mL 1  . oxyCODONE (OXY IR/ROXICODONE) 5 MG immediate release tablet Take 1 tablet (5 mg total) by mouth every 6 (six) hours as needed for moderate pain or severe pain. (Patient not taking: Reported on 06/22/2016) 30 tablet 0  . predniSONE (DELTASONE) 50 MG tablet Take one tab @ 13 hrs prior, 7 hrs prior, and 1 hr. prior to CTA Abdomen and Pelvis on 06/18/16 (Patient not taking: Reported on 06/22/2016) 3 tablet 0   No current facility-administered medications for this visit.     REVIEW OF SYSTEMS:   [X]  denotes positive finding, [ ]  denotes negative finding Cardiac  Comments:  Chest pain or chest pressure:    Shortness of breath upon exertion:    Short of breath when lying flat:    Irregular heart rhythm:        Vascular    Pain in calf, thigh, or hip brought on by ambulation:    Pain in feet at night that wakes you up from your sleep:     Blood clot in your veins:    Leg swelling:         Pulmonary    Oxygen at home:    Productive cough:     Wheezing:         Neurologic    Sudden weakness in arms or legs:     Sudden numbness in arms or legs:     Sudden onset of difficulty speaking or slurred speech:    Temporary loss of vision in one eye:     Problems with dizziness:         Gastrointestinal    Blood in stool:     Vomited blood:         Genitourinary    Burning when urinating:     Blood in urine:         Psychiatric    Major depression:         Hematologic    Bleeding problems:    Problems with blood clotting too easily:        Skin  Rashes or ulcers:        Constitutional    Fever or chills:      PHYSICAL EXAM:   Vitals:   06/22/16 1343 06/22/16 1345  BP: (!) 158/96 (!) 151/92  Pulse: 74 78  Resp: 18   Temp: (!) 96.9 F (36.1 C)   SpO2: 96%   Weight: 194 lb (88 kg)   Height: 5\' 10"  (1.778 m)     GENERAL: The patient is a well-nourished male, in no acute distress. The vital signs are documented above. CARDIAC: There is a regular rate and rhythm.  VASCULAR: I cannot palpate pedal pulses today PULMONARY: Non-labored respirations ABDOMEN: Soft and non-tender with normal pitched bowel sounds.  MUSCULOSKELETAL: There are no major deformities or cyanosis. NEUROLOGIC: No focal weakness or paresthesias are detected. SKIN: There are no ulcers or rashes noted. PSYCHIATRIC: The patient has a normal affect.  STUDIES:  I have reviewed the below studies and summarized their findings:  ABI  R 0.99   L 1.01 Carotid 1-39% bilateral  CTA:    5.0cm AAA  MEDICAL ISSUES:   AAA:  The patient's aneurysm has increased in size over the past 6 months.  It now measures 5 cm and I have recommended repair.  We discussed the area above his renal arteries that is irregular.  I do not think this needs to be treated at this time.  I did state that this could dilate over time and could require repair at a later date.  I plan is for straightforward endovascular repair.  We discussed risks and benefits of the operation including the risk of cardiopulmonary complications, intestinal ischemia, renal complications, bleeding, death.  All his questions were answered.  We have scheduled his operation for Wednesday, June 27.  He will need to get cardiac clearance from Dr. Martinique.  I would like for him to be off of his Plavix and his Eliquis for his surgery.    Annamarie Major, MD Vascular and Vein  [facility] [phone] Pager [phone]

## 2016-06-23 ENCOUNTER — Telehealth: Payer: Self-pay | Admitting: Cardiology

## 2016-06-23 NOTE — Telephone Encounter (Signed)
He is clear for EVAR. May stop Plavix 5 days before and Eliquis 2 days prior.  Enyah Moman Martinique MD, [facility]

## 2016-06-23 NOTE — Telephone Encounter (Signed)
New message     Request for surgical clearance:  What type of surgery is being performed? EVAR 1. When is this surgery scheduled? Pending clearance for 6-27  2. Are there any medications that need to be held prior to surgery and how long?  Hold eliquis and plavix 3. Name of physician performing surgery?  Dr Trula Slade  4. What is your office phone and fax number?  Put note in epic please

## 2016-06-24 NOTE — Telephone Encounter (Signed)
Called patient no answer.LMTC. 

## 2016-06-25 NOTE — Telephone Encounter (Signed)
Spoke to [place] at [facility].Dr.Jordan's recommendations given.  Called patient no answer.[place].

## 2016-06-30 NOTE — Telephone Encounter (Signed)
Called patient no answer.Left Dr.Jordan's recommendations on personal voice mail.

## 2016-07-07 ENCOUNTER — Encounter: Payer: Self-pay | Admitting: Emergency Medicine

## 2016-07-07 ENCOUNTER — Ambulatory Visit (INDEPENDENT_AMBULATORY_CARE_PROVIDER_SITE_OTHER): Payer: Medicare Other | Admitting: Emergency Medicine

## 2016-07-07 DIAGNOSIS — J42 Unspecified chronic bronchitis: Secondary | ICD-10-CM

## 2016-07-07 MED ORDER — TIOTROPIUM BROMIDE-OLODATEROL 2.5-2.5 MCG/ACT IN AERS: 2.0000 | INHALATION_SPRAY | Freq: Every day | RESPIRATORY_TRACT | 0 refills | Status: DC

## 2016-07-07 NOTE — Assessment & Plan Note (Signed)
He stops his Stiolto after his last visit here. He did not feel that it was helping him as much as Xopenex. I explained the difference between maintenance and rescue medication to him today. He is willing to retry Stiolto to see if he gets overall benefit.  We will re-try Stiolto 2 puffs once a day for a month. Keep track of whether your breathing improves on this. If so then we will call in to your pharmacy.  Keep xopenex available to use 2 puffs as needed for shortness of breath Continue your other medications as ordered.  Follow with Dr Lamonte Sakai in 6 months or sooner if you have any problems

## 2016-07-07 NOTE — Addendum Note (Signed)
Addended by: Benson Setting L on: 07/07/2016 11:20 AM   Modules accepted: Orders

## 2016-07-07 NOTE — Progress Notes (Signed)
Subjective:    Patient ID: Jason Fisher, male    DOB: [birth year], 76 y.o.   MRN: [id number]  HPI 76 year old former smoker (50 pack-yrs) with hx CAD/PTCI, carotid artery disease, chronic atrial fibrillation on anticoagulation, HTN with dCHF. Also history AAA, 5.2 cm. He was Admitted to the hospital in early October 2017 for a perirectal abscess which was treated by I&D and then IV abx. As part of that evaluation a CT scan of his abdomen and pelvis CT scan of his chest revealed bilateral irregular scattered very nodular lesions. The largest is 1.5 x 1.4 cm in the lingula. Also noted was some mild mediastinal lymphadenopathy. The nodules were felt to be potentially consistent with either an infectious process, noninfectious inflammatory process or malignancy. He presents now for further follow up. He finished his abx last week. He denies any resp sx while he was in the hospital. He is to have CT angio soon to better eval his AAA.   He notes exertional fatigue, no real dyspnea w normal walking. He does get dyspnea w stairs. He is not having much cough. Occasional mucous, no hemoptysis. He is on plavix and eliquis.   ROV 02/07/16 -- this is a follow-up visit for patient with a history of tobacco use found to have nodular opacities on CT scan of the abdomen and then CT scan of the chest in October 2017, then a repeat in November 2017. A final  repeat CT scan of the chest was done on 01/15/16 that I have personally reviewed. This shows his pulmonary nodules that were initially identified have decreased in size or disappeared. This is good news and is inconsistent with malignancy. He underwent pulmonary function testing on 01/22/16 that I have reviewed. This shows very severe obstruction with a positive bronchodilator response, hyperinflated lung volumes, decreased diffusion capacity that does not completely correct to normal when adjusted for the alveolar volume. He does have occasional exertional dyspnea. His  lasix was doubled 2 days ago due to crackles on exam.   ROV 07/07/16 -- follow-up visit for patient with a history of very severe COPD, also benign pulmonary nodules that we have followed with serial imaging. He has a history of atrial fibrillation, coronary disease, hypertension, with associated diastolic CHF. We started him on Stiolto approximate 6 months ago - he stopped it after about a month, didn't feel that it helped him. He has also had his diuretics adjusted and feels that this has helped his breathing. He does feel that xopenex helps him, uses about once a week.    Review of Systems As per [facility]  Past Medical History:  Diagnosis Date  . 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 BENIGN PROSTATIC HYPERTROPHY, HX OF 02/24/2007  . CAD S/P PCI    a. 2007 Inf STEMI s/p Vision BMS  2 to RCA;  b. 2009 ISR Prox RCA Stent-->with DES;  c. 09/2015 PCI: LM nl, LAD 30p/m, D1 90, LCX nl, OM1 small, 80, OM2 90 (2.5x14 Biofreedom stent), OM3 small, RCA 30p ISR, 43m ISR, 35m, 85d, EF 50-55%.  . 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 Chronic atrial fibrillation ([place]) 11/06/2009   a. On Eliquis (CHA2DS2VASc = 5).  Rate controlled w/ BB and CCB.  Marland Kitchen Chronic diastolic CHF (congestive heart failure) ([place])    a. 09/2015 Echo: EF 55-60%, mildly dil LA.  . CKD (chronic kidney disease), stage III   . COLONIC POLYPS, HX OF 11/02/2006  . DIABETES MELLITUS, TYPE II  10/29/2006  . Diverticulosis   . History of tobacco abuse    a. Quit 09/2015.  Marland Kitchen HYPERLIPIDEMIA 10/29/2006  . Hypertensive heart disease with heart failure (HCC)      Family History  Problem Relation Age of Onset  . Heart attack Mother   . Diabetes Mother   . Heart attack Father   . Hypertension Father   . Heart attack Brother   . Diabetes Brother      Social History   Social History  . Marital status: Married    Spouse name: N/A  . Number of children: 5  . Years of education: N/A   Occupational History  . [employer]    Social History  Main Topics  . Smoking status: Former Smoker    Types: Cigarettes    Quit date: 09/08/2015  . Smokeless tobacco: Never Used     Comment: passive smoker as well  . Alcohol use No  . Drug use: No  . Sexual activity: Not on file   Other Topics Concern  . Not on file   Social History Narrative  . 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   Allergies  Allergen Reactions  . Contrast Media [Iodinated Diagnostic Agents] Anaphylaxis     Outpatient Medications Prior to Visit  Medication Sig Dispense Refill  . atorvastatin (LIPITOR) 20 MG tablet TAKE 1 TABLET BY MOUTH EVERY DAY 90 tablet 1  . clopidogrel (PLAVIX) 75 MG tablet TAKE 1 TABLET BY MOUTH EVERY DAY 90 tablet 3  . [facility] ALL[facility] 25 MG tablet TAKE 2 TABLETS 1 HOUR PRIOR TO CTA ON 06/18/16. TAKE WITH FINAL DOSE OF PREDNISONE  0  . diltiazem (CARDIZEM CD) 360 MG 24 hr capsule Take 1 capsule (360 mg total) by mouth daily. 90 capsule 4  . diphenhydrAMINE (BENADRYL) 50 MG capsule Take one capsule 1 hr. prior to CTA Abd/ Pelvis on 06/18/16 (to be taken with final dose of Prednisone) 1 capsule 0  . ELIQUIS 5 MG TABS tablet TAKE 1 TABLET (5 MG TOTAL) BY MOUTH 2 (TWO) TIMES DAILY. 60 tablet 5  . furosemide (LASIX) 40 MG tablet Take 2 tablets (80 mg total) by mouth daily. (Patient taking differently: Take 40 mg by mouth daily. ) 60 tablet 5  . glimepiride (AMARYL) 4 MG tablet Take 0.5 tablets (2 mg total) by mouth daily with breakfast. 90 tablet 3  . levalbuterol (XOPENEX HFA) 45 MCG/ACT inhaler Inhale 1-2 puffs into the lungs every 6 (six) hours as needed for wheezing. 1 Inhaler 6  . metFORMIN (GLUCOPHAGE) 1000 MG tablet TAKE 1 TABLET TWICE DAILY WITH A MEAL 180 tablet 1  . metFORMIN (GLUCOPHAGE) 1000 MG tablet TAKE 1 TABLET TWICE DAILY WITH A MEAL 180 tablet 2  . metoprolol (LOPRESSOR) 100 MG tablet Take 1 tablet (100 mg total) by mouth 2 (two) times daily. 180 tablet 1  .  nitroGLYCERIN (NITROSTAT) 0.4 MG SL tablet Place 1 tablet (0.4 mg total) under the tongue every 5 (five) minutes as needed for chest pain. X 3 doses 25 tablet 4  . oxyCODONE (OXY IR/ROXICODONE) 5 MG immediate release tablet Take 1 tablet (5 mg total) by mouth every 6 (six) hours as needed for moderate pain or severe pain. 30 tablet 0  . polyethylene glycol (MIRALAX / GLYCOLAX) packet Take 17 g by mouth daily as needed for mild constipation.    Marland Kitchen VICTOZA 18 MG/3ML SOPN INJECT 0.3MLS INTO  SKIN DAILY 9 mL 1  . Tiotropium Bromide-Olodaterol (STIOLTO RESPIMAT) 2.5-2.5 MCG/ACT AERS Inhale 2 puffs into the lungs daily. (Patient not taking: Reported on 07/07/2016) 1 Inhaler 0  . predniSONE (DELTASONE) 50 MG tablet Take one tab @ 13 hrs prior, 7 hrs prior, and 1 hr. prior to CTA Abdomen and Pelvis on 06/18/16 (Patient not taking: Reported on 06/22/2016) 3 tablet 0   No facility-administered medications prior to visit.          Objective:   Physical Exam Vitals:   07/07/16 1002  BP: (!) 132/104  Pulse: 83  SpO2: 95%  Weight: 195 lb 6.4 oz (88.6 kg)  Height: 5\' 10"  (1.778 m)   Gen: Pleasant, well-nourished, in no distress,  normal affect  ENT: No lesions,  mouth clear,  oropharynx clear, no postnasal drip  Neck: No JVD, no TMG, no carotid bruits  Lungs: No use of accessory muscles, clear without rales or rhonchi  Cardiovascular: RRR, heart sounds normal, no murmur or gallops, no peripheral edema  Musculoskeletal: No deformities, no cyanosis or clubbing  Neuro: alert, non focal  Skin: Warm, no lesions or rashes   11/06/15 -- CT chest  COMPARISON:  CT abdomen and pelvis including lung bases November 04, 2015; chest radiograph September 29, 2015  FINDINGS: Cardiovascular: Prominence in the ascending thoracic aorta is noted with a measured transverse diameter of 4.0 x 3.7 cm. The aorta is tortuous in the descending thoracic aorta without apparent aneurysmal dilatation. There is no thoracic  aortic dissection. The visualized great vessels show moderate atherosclerotic calcification at the origins of the right common carotid and left subclavian artery. There is no appreciable pericardial thickening. There are multiple foci of coronary artery calcification. No evident pulmonary embolus.  Mediastinum/Nodes: Thyroid appears unremarkable. There are scattered subcentimeter mediastinal lymph nodes. There is a sub- carinal lymph node measuring 1.5 x 1.4 cm. There is a lymph node in the right hilum measuring 1.1 x 0.9 cm.  Lungs/Pleura: There is somewhat irregular opacity in the superior lingula medially with a nodular appearing area measuring 1.0 x 1.0 cm, best seen on axial slice 89 series 3. This finding is also apparent on sagittal slice 82 series 5. On axial slice 967 series 3 and sagittal slice 893 series 5, there is a nodular appearing opacity with subtle cavitation measuring 1.5 x 1.1 cm in the inferior lingula. There is a somewhat spiculated appearing lesion in the medial segment of the right lower lobe measuring 1.5 x 1.4 cm. This lesion is best seen on axial slice 810 series 3. There is a somewhat irregular nodular appearing opacity in the posterior segment of the left lower lobe measuring 1.2 x 1.0 cm, best seen on axial slice 175 series 3. There is scarring in each lung base as well. There is a nodular opacity in the posterior segment right lower lobe abutting the pleura measuring 1.0 x 0.7 cm, best seen on axial slice 102 series 3. There is lower lobe bronchiectatic change bilaterally, slightly more severe on the right than on the left. There is no appreciable pleural effusion. There is mild left base atelectatic change.  Upper Abdomen: In the visualized upper abdomen, there is atherosclerotic calcification in the aorta. Incomplete visualization of abdominal aortic aneurysm noted, described on recent CT abdomen. Liver contour is somewhat lobular suggesting a  degree of underlying cirrhosis. Scattered liver granulomas noted. No new lesion identified in the upper abdomen compared to 2 days prior.  Musculoskeletal: There is degenerative change in  the thoracic spine. No blastic or lytic bone lesions are evident. 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  IMPRESSION: Irregular nodular opacities in the lung parenchyma as summarized above. Subtle questionable cavitation in a lesion in the inferior lingula noted. Largest nodular opacity measures 1.5 x 1.4 cm. This appearance is concerning for multifocal neoplasm. In the appropriate clinical setting, septic emboli could present similarly. There are mildly prominent sub- carinal and right hilar lymph nodes which could be either of malignant or reactive etiology. Given concern for potential multifocal neoplasm and possible lymph node involvement, nuclear medicine PET study advised to further evaluate.      Assessment & Plan:  COPD (chronic obstructive pulmonary disease) ([place]) He stops his Stiolto after his last visit here. He did not feel that it was helping him as much as Xopenex. I explained the difference between maintenance and rescue medication to him today. He is willing to retry Stiolto to see if he gets overall benefit.  We will re-try Stiolto 2 puffs once a day for a month. Keep track of whether your breathing improves on this. If so then we will call in to your pharmacy.  Keep xopenex available to use 2 puffs as needed for shortness of breath Continue your other medications as ordered.  Follow with Dr Lamonte Sakai in 6 months or sooner if you have any problems  Baltazar Apo, MD, PhD 07/07/2016, 10:31 AM [facility] [phone] or if no answer [phone]

## 2016-07-07 NOTE — Patient Instructions (Signed)
We will re-try Stiolto 2 puffs once a day for a month. Keep track of whether your breathing improves on this. If so then we will call in to your pharmacy.  Keep xopenex available to use 2 puffs as needed for shortness of breath Continue your other medications as ordered.  Follow with Dr Lamonte Sakai in 6 months or sooner if you have any problems

## 2016-07-13 ENCOUNTER — Other Ambulatory Visit: Payer: Self-pay | Admitting: Internal Medicine

## 2016-07-23 ENCOUNTER — Other Ambulatory Visit: Payer: Self-pay

## 2016-07-23 ENCOUNTER — Encounter: Payer: Self-pay | Admitting: Surgery

## 2016-07-28 NOTE — Pre-Procedure Instructions (Signed)
Jason Fisher  07/28/2016      [id number] [place], [place] - [address] [place] [address] Phone: [phone] Fax: [phone]    Your procedure is scheduled on June 29  Report to [place] [facility] [place].M.  Call this number if you have problems the morning of surgery:  [phone]   Remember:  Do not eat food or drink liquids after midnight.   Take these medicines the morning of surgery with A SIP OF WATER diltiazem (CARDIZEM CD), levalbuterol (XOPENEX HFA), metoprolol (LOPRESSOR), nitroGLYCERIN (NITROSTAT)  If needed, oxyCODONE (OXY IR/ROXICODONE) if needed, Tiotropium Bromide-Olodaterol (STIOLTO RESPIMAT) if needed,   7 days prior to surgery STOP taking any Aspirin, Aleve, Naproxen, Ibuprofen, Motrin, Advil, Goody's, BC's, all herbal medications, fish oil, and all vitamins  WHAT DO I DO ABOUT MY DIABETES MEDICATION?   Marland Kitchen Do not take oral diabetes medicines (pills) the morning of surgery. metFORMIN (GLUCOPHAGE) and glimepiride (AMARYL)    How to Manage Your Diabetes Before and After Surgery  Why is it important to control my blood sugar before and after surgery? . Improving blood sugar levels before and after surgery helps healing and can limit problems. . A way of improving blood sugar control is eating a healthy diet by: o  Eating less sugar and carbohydrates o  Increasing activity/exercise o  Talking with your doctor about reaching your blood sugar goals . High blood sugars (greater than 180 mg/dL) can raise your risk of infections and slow your recovery, so you will need to focus on controlling your diabetes during the weeks before surgery. . Make sure that the doctor who takes care of your diabetes knows about your planned surgery including the date and location.  How do I manage my blood sugar before surgery? . Check your blood sugar at least 4 times a day, starting 2  days before surgery, to make sure that the level is not too high or low. o Check your blood sugar the morning of your surgery when you wake up and every 2 hours until you get to the Short Stay unit. . If your blood sugar is less than 70 mg/dL, you will need to treat for low blood sugar: o Do not take insulin. o Treat a low blood sugar (less than 70 mg/dL) with  cup of clear juice (cranberry or apple), 4 glucose tablets, OR glucose gel. o Recheck blood sugar in 15 minutes after treatment (to make sure it is greater than 70 mg/dL). If your blood sugar is not greater than 70 mg/dL on recheck, call [phone] for further instructions. . Report your blood sugar to the short stay nurse when you get to Short Stay.  . If you are admitted to the hospital after surgery: o Your blood sugar will be checked by the staff and you will probably be given insulin after surgery (instead of oral diabetes medicines) to make sure you have good blood sugar levels. o The goal for blood sugar control after surgery is 80-180 mg/dL.     Do not wear jewelry  Do not wear lotions, powders, or cologne, or deoderant.  Men may shave face and neck.  Do not bring valuables to the hospital.  [facility] is not responsible for any belongings or valuables.  Contacts, dentures or bridgework may not be worn into surgery.  Leave your suitcase in the car.  After surgery  it may be brought to your room.  For patients admitted to the hospital, discharge time will be determined by your treatment team.  Patients discharged the day of surgery will not be allowed to drive home.    Special instructions:   [facility]- Preparing For Surgery  Before surgery, you can play an important role. Because skin is not sterile, your skin needs to be as free of germs as possible. You can reduce the number of germs on your skin by washing with CHG (chlorahexidine gluconate) Soap before surgery.  CHG is an antiseptic cleaner which kills germs and  bonds with the skin to continue killing germs even after washing.  Please do not use if you have an allergy to CHG or antibacterial soaps. If your skin becomes reddened/irritated stop using the CHG.  Do not shave (including legs and underarms) for at least 48 hours prior to first CHG shower. It is OK to shave your face.  Please follow these instructions carefully.   1. Shower the NIGHT BEFORE SURGERY and the MORNING OF SURGERY with CHG.   2. If you chose to wash your hair, wash your hair first as usual with your normal shampoo.  3. After you shampoo, rinse your hair and body thoroughly to remove the shampoo.  4. Use CHG as you would any other liquid soap. You can apply CHG directly to the skin and wash gently with a scrungie or a clean washcloth.   5. Apply the CHG Soap to your body ONLY FROM THE NECK DOWN.  Do not use on open wounds or open sores. Avoid contact with your eyes, ears, mouth and genitals (private parts). Wash genitals (private parts) with your normal soap.  6. Wash thoroughly, paying special attention to the area where your surgery will be performed.  7. Thoroughly rinse your body with warm water from the neck down.  8. DO NOT shower/wash with your normal soap after using and rinsing off the CHG Soap.  9. Pat yourself dry with a CLEAN TOWEL.   10. Wear CLEAN PAJAMAS   11. Place CLEAN SHEETS on your bed the night of your first shower and DO NOT SLEEP WITH PETS.    Day of Surgery: Do not apply any deodorants/lotions. Please wear clean clothes to the hospital/surgery center.      Please read over the following fact sheets that you were given.

## 2016-07-29 ENCOUNTER — Encounter (HOSPITAL_COMMUNITY)
Admission: RE | Admit: 2016-07-29 | Discharge: 2016-07-29 | Disposition: A | Discharge status: Home / Self Care | Payer: Medicare Other | Source: Ambulatory Visit | Attending: Surgery | Admitting: Surgery

## 2016-07-29 ENCOUNTER — Encounter (HOSPITAL_COMMUNITY): Payer: Self-pay

## 2016-07-29 DIAGNOSIS — E7800 Pure hypercholesterolemia, unspecified: Secondary | ICD-10-CM | POA: Diagnosis not present

## 2016-07-29 DIAGNOSIS — Z7902 Long term (current) use of antithrombotics/antiplatelets: Secondary | ICD-10-CM | POA: Diagnosis not present

## 2016-07-29 DIAGNOSIS — Z87891 Personal history of nicotine dependence: Secondary | ICD-10-CM | POA: Diagnosis not present

## 2016-07-29 DIAGNOSIS — E1122 Type 2 diabetes mellitus with diabetic chronic kidney disease: Secondary | ICD-10-CM | POA: Diagnosis not present

## 2016-07-29 DIAGNOSIS — Z87892 Personal history of anaphylaxis: Secondary | ICD-10-CM | POA: Diagnosis not present

## 2016-07-29 DIAGNOSIS — I482 Chronic atrial fibrillation: Secondary | ICD-10-CM | POA: Diagnosis not present

## 2016-07-29 DIAGNOSIS — J449 Chronic obstructive pulmonary disease, unspecified: Secondary | ICD-10-CM | POA: Diagnosis not present

## 2016-07-29 DIAGNOSIS — I130 Hypertensive heart and chronic kidney disease with heart failure and stage 1 through stage 4 chronic kidney disease, or unspecified chronic kidney disease: Secondary | ICD-10-CM | POA: Diagnosis not present

## 2016-07-29 DIAGNOSIS — Z7984 Long term (current) use of oral hypoglycemic drugs: Secondary | ICD-10-CM | POA: Diagnosis not present

## 2016-07-29 DIAGNOSIS — I739 Peripheral vascular disease, unspecified: Secondary | ICD-10-CM | POA: Diagnosis not present

## 2016-07-29 DIAGNOSIS — Z91041 Radiographic dye allergy status: Secondary | ICD-10-CM | POA: Diagnosis not present

## 2016-07-29 DIAGNOSIS — Z955 Presence of coronary angioplasty implant and graft: Secondary | ICD-10-CM | POA: Diagnosis not present

## 2016-07-29 DIAGNOSIS — N183 Chronic kidney disease, stage 3 (moderate): Secondary | ICD-10-CM | POA: Diagnosis not present

## 2016-07-29 DIAGNOSIS — Z833 Family history of diabetes mellitus: Secondary | ICD-10-CM | POA: Diagnosis not present

## 2016-07-29 DIAGNOSIS — I252 Old myocardial infarction: Secondary | ICD-10-CM | POA: Diagnosis not present

## 2016-07-29 DIAGNOSIS — I25119 Atherosclerotic heart disease of native coronary artery with unspecified angina pectoris: Secondary | ICD-10-CM | POA: Diagnosis not present

## 2016-07-29 DIAGNOSIS — Z7901 Long term (current) use of anticoagulants: Secondary | ICD-10-CM | POA: Diagnosis not present

## 2016-07-29 DIAGNOSIS — Z8249 Family history of ischemic heart disease and other diseases of the circulatory system: Secondary | ICD-10-CM | POA: Diagnosis not present

## 2016-07-29 DIAGNOSIS — I5032 Chronic diastolic (congestive) heart failure: Secondary | ICD-10-CM | POA: Diagnosis not present

## 2016-07-29 DIAGNOSIS — I714 Abdominal aortic aneurysm, without rupture: Secondary | ICD-10-CM | POA: Diagnosis not present

## 2016-07-29 HISTORY — DX: Chronic obstructive pulmonary disease, unspecified: J44.9

## 2016-07-29 LAB — URINALYSIS, ROUTINE W REFLEX MICROSCOPIC
BILIRUBIN URINE: NEGATIVE
Glucose, UA: 50 mg/dL — AB
Hgb urine dipstick: NEGATIVE
Ketones, ur: NEGATIVE mg/dL
LEUKOCYTES UA: NEGATIVE
Nitrite: NEGATIVE
PH: 5 (ref 5.0–8.0)
Protein, ur: 30 mg/dL — AB
SPECIFIC GRAVITY, URINE: 1.014 (ref 1.005–1.030)

## 2016-07-29 LAB — APTT: APTT: 31 s (ref 24–36)

## 2016-07-29 LAB — CBC
HCT: 46 % (ref 39.0–52.0)
HEMOGLOBIN: 15.2 g/dL (ref 13.0–17.0)
MCH: 27.9 pg (ref 26.0–34.0)
MCHC: 33 g/dL (ref 30.0–36.0)
MCV: 84.4 fL (ref 78.0–100.0)
Platelets: 188 10*3/uL (ref 150–400)
RBC: 5.45 MIL/uL (ref 4.22–5.81)
RDW: 15.3 % (ref 11.5–15.5)
WBC: 9 10*3/uL (ref 4.0–10.5)

## 2016-07-29 LAB — BLOOD GAS, ARTERIAL
Acid-base deficit: 0.3 mmol/L (ref 0.0–2.0)
Bicarbonate: 23.1 mmol/L (ref 20.0–28.0)
Drawn by: 421801
FIO2: 21
O2 Saturation: 90.1 %
PATIENT TEMPERATURE: 98.6
PO2 ART: 56.3 mmHg — AB (ref 83.0–108.0)
pCO2 arterial: 32.7 mmHg (ref 32.0–48.0)
pH, Arterial: 7.462 — ABNORMAL HIGH (ref 7.350–7.450)

## 2016-07-29 LAB — COMPREHENSIVE METABOLIC PANEL
ALBUMIN: 3.8 g/dL (ref 3.5–5.0)
ALK PHOS: 93 U/L (ref 38–126)
ALT: 16 U/L — AB (ref 17–63)
AST: 22 U/L (ref 15–41)
Anion gap: 9 (ref 5–15)
BUN: 16 mg/dL (ref 6–20)
CALCIUM: 9.2 mg/dL (ref 8.9–10.3)
CO2: 25 mmol/L (ref 22–32)
CREATININE: 1.38 mg/dL — AB (ref 0.61–1.24)
Chloride: 106 mmol/L (ref 101–111)
GFR calc Af Amer: 56 mL/min — ABNORMAL LOW (ref >60)
GFR calc non Af Amer: 48 mL/min — ABNORMAL LOW (ref >60)
GLUCOSE: 172 mg/dL — AB (ref 65–99)
Potassium: 3.3 mmol/L — ABNORMAL LOW (ref 3.5–5.1)
Sodium: 140 mmol/L (ref 135–145)
Total Bilirubin: 0.7 mg/dL (ref 0.3–1.2)
Total Protein: 6.9 g/dL (ref 6.5–8.1)

## 2016-07-29 LAB — PROTIME-INR
INR: 1.09
Prothrombin Time: 14.2 seconds (ref 11.4–15.2)

## 2016-07-29 LAB — SURGICAL PCR SCREEN
MRSA, PCR: NEGATIVE
Staphylococcus aureus: NEGATIVE

## 2016-07-29 LAB — GLUCOSE, CAPILLARY: Glucose-Capillary: 171 mg/dL — ABNORMAL HIGH (ref 65–99)

## 2016-07-29 NOTE — Progress Notes (Signed)
   07/29/16 1526  OBSTRUCTIVE SLEEP APNEA  Have you ever been diagnosed with sleep apnea through a sleep study? No  Do you snore loudly (loud enough to be heard through closed doors)?  1  Do you often feel tired, fatigued, or sleepy during the daytime (such as falling asleep during driving or talking to someone)? 0  Has anyone observed you stop breathing during your sleep? 0  Do you have, or are you being treated for high blood pressure? 1  BMI more than 35 kg/m2? 0  Age > 50 (1-yes) 1  Neck circumference greater than:Male 16 inches or larger, Male 17inches or larger? 1  Male Gender (Yes=1) 1  Obstructive Sleep Apnea Score 5  Score 5 or greater  Results sent to PCP

## 2016-07-29 NOTE — Progress Notes (Signed)
PCP - Peter Kwiatkowski Cardiologist - Peter Martinique [place]  Chest x-ray - 04/21/16 EKG - 05/04/16 Stress Test -  ECHO - 09/27/15 Cardiac Cath - 09/26/15     Fasting Blood Sugar - 150s-240s Checks Blood Sugar __1___ times a day  Per Dr. Martinique Stop Eliquis 2 days before surgery Stop Plavix 5 days prior to surgery  Sending to anesthesia for review of cardiac history  Patient denies shortness of breath, fever, cough and chest pain at PAT appointment   Patient verbalized understanding of instructions that were given to them at the PAT appointment. Patient was also instructed that they will need to review over the PAT instructions again at home before surgery.

## 2016-07-30 LAB — HEMOGLOBIN A1C
HEMOGLOBIN A1C: 9.5 % — AB (ref 4.8–5.6)
MEAN PLASMA GLUCOSE: 226 mg/dL

## 2016-07-30 NOTE — Progress Notes (Signed)
Anesthesia Chart Review:  Pt is a 76 year old male scheduled for abdominal aortic endovascular stent graft on 07/31/2016 with Harold Barban, M.D.  - PCP is Bluford Kaufmann, MD - Pulmonologist is Baltazar Apo, M.D., last office visit 07/07/16. - Cardiologist is Peter Martinique, M.D. who cleared patient for surgery  PMH includes: CAD (BMS x2 to RCA 2007; DES to ISR RCA 2009; DES to [place] 2017), chronic atrial fibrillation, chronic diastolic CHF, HTN, DM, hyperlipidemia, AAA, COPD, CKD. Former smoker (quit 09/08/15). BMI 30  - Hospitalized 3/20-23/18 for afib with RVR, acute on chronic CHF (thought due to afib RVR)  Medications include: Lipitor, Eliquis, Plavix, Cardizem, Lasix, glimepiride, Xopenex, metformin, metoprolol, Stiolto Respimat, victoza. Last dose Eliquis 07/28/16. Last dose Plavix 07/25/16.  Preoperative labs reviewed. HbA1c 9.5, glucose 172.  1 view CXR 04/21/16: Suspect small bilateral effusions with left greater than right bibasilar atelectasis, pneumonia or edema. There is cardiomegaly with mild central congestion  EKG 05/04/16: Atrial fibrillation. ST and T-wave abnormality, consider inferior ischemia. ST and T-wave abnormality, consider anterolateral ischemia   CT angio abd pelvis 06/18/16:  - Infrarenal abdominal aortic aneurysm has increased in size from a maximal diameter of 4.8 cm to 5.0 cm. 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 - Significant narrowing at the origin of the celiac axis is stable. - Cirrhotic liver. - Bibasilar bronchiectasis.  CT super D chest 01/15/16:  1. No pulmonary lesions concerning for malignancy. 2. There is some new subsegmental atelectasis anteriorly in the right upper lobe and some anterior atelectasis in the left upper lobe as well, with faint subpleural nodularity in the left lower lobe compatible with atypical infectious bronchiolitis. 3. Airway thickening is present, suggesting bronchitis or reactive airways disease. 4.  Suspected hepatic cirrhosis. 5. Scattered small mediastinal lymph nodes are not pathologically enlarged. 6. Coronary, aortic arch, and branch vessel atherosclerotic vascular disease. Borderline cardiomegaly. 7. Thoracic spondylosis.  Echo 09/27/15:  - Left ventricle: The cavity size was normal. Systolic function was normal. The estimated ejection fraction was in the range of 55% to 60%. Wall motion was normal; there were no regional wall motion abnormalities. Doppler parameters are consistent with elevated mean left atrial filling pressure. - Left atrium: The atrium was mildly dilated.  Cardiac cath 09/26/15:   Prox LAD to Mid LAD lesion, 30 %stenosed.  1st Diag lesion, 90 %stenosed.  Ost 1st Mrg to 1st Mrg lesion, 80 %stenosed.  Ost RCA to Prox RCA lesion, 30 %stenosed.  Mid RCA-2 lesion, 40 %stenosed.  Mid RCA-1 lesion, 15 %stenosed.  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  There is mild left ventricular systolic dysfunction.  The left ventricular ejection fraction is 50-55% by visual estimate.  LV end diastolic pressure is moderately elevated.  A drug eluting stent was successfully placed.  2nd Mrg lesion, 90 %stenosed.  Post intervention, there is a 0% residual stenosis. 1. 3 vessel obstructive CAD.     - chronic severe stenosis in the first diagonal    - New 90% stenosis in a large second OM    - Stents in the proximal and mid RCA are patent    - Aneurysm and stenosis in the PLOM branch of the RCA- the aneurysm is new compared to 2009 2. Mild LV dysfunction 3. Elevated LVEDP 4. Allergic response to contrast with hives and bronchospasm. 5. Successful stenting of the second OM with a Biofreedom stent.  - Plan: continue IV diuresis. Patient  received steroids, Benadryl, and IV pepcid for contrast reaction. Continue DAPT for one month then DC ASA. Resume Eliquis in am.   If no changes, I anticipate pt can proceed with surgery as scheduled.    Willeen Cass, FNP-BC [facility]Anesthesiology Phone: [phone] 07/30/2016 10:41 AM

## 2016-07-31 ENCOUNTER — Encounter (HOSPITAL_COMMUNITY)
Admission: RE | Disposition: A | Discharge status: Home / Self Care | Payer: Self-pay | Source: Ambulatory Visit | Attending: Surgery

## 2016-07-31 ENCOUNTER — Inpatient Hospital Stay (HOSPITAL_COMMUNITY): Payer: Medicare Other | Admitting: Certified Registered Nurse Anesthetist

## 2016-07-31 ENCOUNTER — Encounter (HOSPITAL_COMMUNITY): Payer: Self-pay | Admitting: Orthopedic Surgery

## 2016-07-31 ENCOUNTER — Inpatient Hospital Stay (HOSPITAL_COMMUNITY): Payer: Medicare Other | Admitting: Emergency Medicine

## 2016-07-31 ENCOUNTER — Inpatient Hospital Stay (HOSPITAL_COMMUNITY): Payer: Medicare Other

## 2016-07-31 ENCOUNTER — Inpatient Hospital Stay (HOSPITAL_COMMUNITY)
Admission: RE | Admit: 2016-07-31 | Discharge: 2016-08-01 | DRG: 269 | Disposition: A | Discharge status: Home / Self Care | Payer: Medicare Other | Source: Ambulatory Visit | Attending: Surgery | Admitting: Surgery

## 2016-07-31 DIAGNOSIS — E7800 Pure hypercholesterolemia, unspecified: Secondary | ICD-10-CM | POA: Diagnosis not present

## 2016-07-31 DIAGNOSIS — Z95828 Presence of other vascular implants and grafts: Secondary | ICD-10-CM

## 2016-07-31 DIAGNOSIS — Z955 Presence of coronary angioplasty implant and graft: Secondary | ICD-10-CM | POA: Diagnosis not present

## 2016-07-31 DIAGNOSIS — Z7984 Long term (current) use of oral hypoglycemic drugs: Secondary | ICD-10-CM

## 2016-07-31 DIAGNOSIS — N183 Chronic kidney disease, stage 3 (moderate): Secondary | ICD-10-CM | POA: Diagnosis present

## 2016-07-31 DIAGNOSIS — I5032 Chronic diastolic (congestive) heart failure: Secondary | ICD-10-CM | POA: Diagnosis present

## 2016-07-31 DIAGNOSIS — I739 Peripheral vascular disease, unspecified: Secondary | ICD-10-CM | POA: Diagnosis present

## 2016-07-31 DIAGNOSIS — Z8679 Personal history of other diseases of the circulatory system: Secondary | ICD-10-CM

## 2016-07-31 DIAGNOSIS — Z91041 Radiographic dye allergy status: Secondary | ICD-10-CM

## 2016-07-31 DIAGNOSIS — I130 Hypertensive heart and chronic kidney disease with heart failure and stage 1 through stage 4 chronic kidney disease, or unspecified chronic kidney disease: Secondary | ICD-10-CM | POA: Diagnosis not present

## 2016-07-31 DIAGNOSIS — I482 Chronic atrial fibrillation: Secondary | ICD-10-CM | POA: Diagnosis present

## 2016-07-31 DIAGNOSIS — Z781 Physical restraint status: Secondary | ICD-10-CM

## 2016-07-31 DIAGNOSIS — I252 Old myocardial infarction: Secondary | ICD-10-CM | POA: Diagnosis not present

## 2016-07-31 DIAGNOSIS — Z8249 Family history of ischemic heart disease and other diseases of the circulatory system: Secondary | ICD-10-CM | POA: Diagnosis not present

## 2016-07-31 DIAGNOSIS — I714 Abdominal aortic aneurysm, without rupture, unspecified: Secondary | ICD-10-CM | POA: Diagnosis present

## 2016-07-31 DIAGNOSIS — Z87891 Personal history of nicotine dependence: Secondary | ICD-10-CM

## 2016-07-31 DIAGNOSIS — Z833 Family history of diabetes mellitus: Secondary | ICD-10-CM

## 2016-07-31 DIAGNOSIS — Z7902 Long term (current) use of antithrombotics/antiplatelets: Secondary | ICD-10-CM | POA: Diagnosis not present

## 2016-07-31 DIAGNOSIS — J449 Chronic obstructive pulmonary disease, unspecified: Secondary | ICD-10-CM | POA: Diagnosis present

## 2016-07-31 DIAGNOSIS — I25119 Atherosclerotic heart disease of native coronary artery with unspecified angina pectoris: Secondary | ICD-10-CM | POA: Diagnosis present

## 2016-07-31 DIAGNOSIS — E1122 Type 2 diabetes mellitus with diabetic chronic kidney disease: Secondary | ICD-10-CM | POA: Diagnosis present

## 2016-07-31 DIAGNOSIS — Z87892 Personal history of anaphylaxis: Secondary | ICD-10-CM | POA: Diagnosis not present

## 2016-07-31 DIAGNOSIS — I509 Heart failure, unspecified: Secondary | ICD-10-CM | POA: Diagnosis not present

## 2016-07-31 DIAGNOSIS — Z7901 Long term (current) use of anticoagulants: Secondary | ICD-10-CM | POA: Diagnosis not present

## 2016-07-31 HISTORY — DX: Pneumonia, unspecified organism: J18.9

## 2016-07-31 HISTORY — PX: ENDOVASCULAR REPAIR/STENT GRAFT: CATH118280

## 2016-07-31 HISTORY — DX: ST elevation (STEMI) myocardial infarction of unspecified site: I21.3

## 2016-07-31 HISTORY — PX: ABDOMINAL AORTIC ENDOVASCULAR STENT GRAFT: SHX5707

## 2016-07-31 LAB — GLUCOSE, CAPILLARY
GLUCOSE-CAPILLARY: 201 mg/dL — AB (ref 65–99)
Glucose-Capillary: 250 mg/dL — ABNORMAL HIGH (ref 65–99)
Glucose-Capillary: 261 mg/dL — ABNORMAL HIGH (ref 65–99)

## 2016-07-31 LAB — CBC
HCT: 40.8 % (ref 39.0–52.0)
HEMATOCRIT: 26.5 % — AB (ref 39.0–52.0)
Hemoglobin: 13.4 g/dL (ref 13.0–17.0)
Hemoglobin: 8.7 g/dL — ABNORMAL LOW (ref 13.0–17.0)
MCH: 27.6 pg (ref 26.0–34.0)
MCH: 27.3 pg (ref 26.0–34.0)
MCHC: 32.8 g/dL (ref 30.0–36.0)
MCHC: 32.8 g/dL (ref 30.0–36.0)
MCV: 84 fL (ref 78.0–100.0)
MCV: 83.1 fL (ref 78.0–100.0)
Platelets: 155 10*3/uL (ref 150–400)
Platelets: 95 10*3/uL — ABNORMAL LOW (ref 150–400)
RBC: 4.86 MIL/uL (ref 4.22–5.81)
RBC: 3.19 MIL/uL — ABNORMAL LOW (ref 4.22–5.81)
RDW: 15 % (ref 11.5–15.5)
RDW: 14.6 % (ref 11.5–15.5)
WBC: 9.7 10*3/uL (ref 4.0–10.5)
WBC: 6.2 10*3/uL (ref 4.0–10.5)

## 2016-07-31 LAB — BASIC METABOLIC PANEL
Anion gap: 7 (ref 5–15)
BUN: 19 mg/dL (ref 6–20)
CO2: 22 mmol/L (ref 22–32)
CREATININE: 1.51 mg/dL — AB (ref 0.61–1.24)
Calcium: 8.6 mg/dL — ABNORMAL LOW (ref 8.9–10.3)
Chloride: 108 mmol/L (ref 101–111)
GFR calc non Af Amer: 43 mL/min — ABNORMAL LOW (ref >60)
GFR, EST AFRICAN AMERICAN: 50 mL/min — AB (ref >60)
Glucose, Bld: 229 mg/dL — ABNORMAL HIGH (ref 65–99)
Potassium: 3.7 mmol/L (ref 3.5–5.1)
SODIUM: 137 mmol/L (ref 135–145)

## 2016-07-31 LAB — CREATININE, SERUM: CREATININE: 0.79 mg/dL (ref 0.61–1.24)

## 2016-07-31 LAB — PREPARE RBC (CROSSMATCH)

## 2016-07-31 LAB — MAGNESIUM: MAGNESIUM: 1.9 mg/dL (ref 1.7–2.4)

## 2016-07-31 LAB — PROTIME-INR
INR: 1.68
Prothrombin Time: 20 seconds — ABNORMAL HIGH (ref 11.4–15.2)

## 2016-07-31 LAB — APTT: APTT: 34 s (ref 24–36)

## 2016-07-31 SURGERY — INSERTION, ENDOVASCULAR STENT GRAFT, AORTA, ABDOMINAL
Anesthesia: General | Site: Abdomen

## 2016-07-31 MED ORDER — PROPOFOL 10 MG/ML IV BOLUS
INTRAVENOUS | Status: DC | PRN
  Administered 2016-07-31: 140 mg via INTRAVENOUS
  Administered 2016-07-31: 20 mg via INTRAVENOUS
  Administered 2016-07-31: 10 mg via INTRAVENOUS

## 2016-07-31 MED ORDER — POTASSIUM CHLORIDE CRYS ER 20 MEQ PO TBCR: 20.0000 meq | EXTENDED_RELEASE_TABLET | Freq: Every day | ORAL | Status: DC | PRN

## 2016-07-31 MED ORDER — SODIUM CHLORIDE 0.9 % IV SOLN
INTRAVENOUS | Status: DC | PRN
  Administered 2016-07-31: 09:00:00

## 2016-07-31 MED ORDER — ATORVASTATIN CALCIUM 20 MG PO TABS
20.0000 mg | ORAL_TABLET | Freq: Every day | ORAL | Status: DC
  Administered 2016-07-31: 20 mg via ORAL
  Filled 2016-07-31: qty 1

## 2016-07-31 MED ORDER — METHYLPREDNISOLONE SODIUM SUCC 125 MG IJ SOLR
125.0000 mg | INTRAMUSCULAR | Status: DC
  Filled 2016-07-31: qty 2

## 2016-07-31 MED ORDER — MIDAZOLAM HCL 2 MG/2ML IJ SOLN
INTRAMUSCULAR | Status: AC
  Filled 2016-07-31: qty 2

## 2016-07-31 MED ORDER — ONDANSETRON HCL 4 MG/2ML IJ SOLN: 4.0000 mg | Freq: Four times a day (QID) | INTRAMUSCULAR | Status: DC | PRN

## 2016-07-31 MED ORDER — ALBUTEROL SULFATE (2.5 MG/3ML) 0.083% IN NEBU: 3.0000 mL | INHALATION_SOLUTION | Freq: Four times a day (QID) | RESPIRATORY_TRACT | Status: DC | PRN

## 2016-07-31 MED ORDER — CHLORHEXIDINE GLUCONATE 4 % EX LIQD: 60.0000 mL | Freq: Once | CUTANEOUS | Status: DC

## 2016-07-31 MED ORDER — ROCURONIUM BROMIDE 100 MG/10ML IV SOLN
INTRAVENOUS | Status: DC | PRN
  Administered 2016-07-31: 50 mg via INTRAVENOUS

## 2016-07-31 MED ORDER — HEPARIN SODIUM (PORCINE) 5000 UNIT/ML IJ SOLN
5000.0000 [IU] | Freq: Three times a day (TID) | INTRAMUSCULAR | Status: DC
  Administered 2016-08-01: 5000 [IU] via SUBCUTANEOUS
  Filled 2016-07-31: qty 1

## 2016-07-31 MED ORDER — DOCUSATE SODIUM 100 MG PO CAPS
100.0000 mg | ORAL_CAPSULE | Freq: Every day | ORAL | Status: DC
  Administered 2016-08-01: 100 mg via ORAL
  Filled 2016-07-31: qty 1

## 2016-07-31 MED ORDER — NEOSTIGMINE METHYLSULFATE 10 MG/10ML IV SOLN
INTRAVENOUS | Status: DC | PRN
  Administered 2016-07-31: 4 mg via INTRAVENOUS

## 2016-07-31 MED ORDER — HEPARIN SODIUM (PORCINE) 1000 UNIT/ML IJ SOLN
INTRAMUSCULAR | Status: DC | PRN
  Administered 2016-07-31: 9000 [IU] via INTRAVENOUS

## 2016-07-31 MED ORDER — SUGAMMADEX SODIUM 200 MG/2ML IV SOLN
INTRAVENOUS | Status: DC | PRN
  Administered 2016-07-31: 200 mg via INTRAVENOUS

## 2016-07-31 MED ORDER — INSULIN ASPART 100 UNIT/ML ~~LOC~~ SOLN
0.0000 [IU] | Freq: Three times a day (TID) | SUBCUTANEOUS | Status: DC
  Administered 2016-07-31: 5 [IU] via SUBCUTANEOUS
  Administered 2016-08-01: 8 [IU] via SUBCUTANEOUS

## 2016-07-31 MED ORDER — MIDAZOLAM HCL 5 MG/5ML IJ SOLN
INTRAMUSCULAR | Status: DC | PRN
  Administered 2016-07-31: 1 mg via INTRAVENOUS

## 2016-07-31 MED ORDER — GLIMEPIRIDE 4 MG PO TABS
4.0000 mg | ORAL_TABLET | Freq: Every day | ORAL | Status: DC
  Administered 2016-08-01: 4 mg via ORAL
  Filled 2016-07-31: qty 1

## 2016-07-31 MED ORDER — IODIXANOL 320 MG/ML IV SOLN
INTRAVENOUS | Status: DC | PRN
  Administered 2016-07-31: 150 mL via INTRA_ARTERIAL

## 2016-07-31 MED ORDER — LABETALOL HCL 5 MG/ML IV SOLN: 10.0000 mg | INTRAVENOUS | Status: DC | PRN

## 2016-07-31 MED ORDER — FUROSEMIDE 40 MG PO TABS
40.0000 mg | ORAL_TABLET | Freq: Every day | ORAL | Status: DC
  Administered 2016-07-31 – 2016-08-01 (×2): 40 mg via ORAL
  Filled 2016-07-31 (×2): qty 1

## 2016-07-31 MED ORDER — METOPROLOL TARTRATE 5 MG/5ML IV SOLN: 2.0000 mg | INTRAVENOUS | Status: DC | PRN

## 2016-07-31 MED ORDER — LIDOCAINE HCL (CARDIAC) 20 MG/ML IV SOLN
INTRAVENOUS | Status: DC | PRN
  Administered 2016-07-31: 60 mg via INTRAVENOUS

## 2016-07-31 MED ORDER — FENTANYL CITRATE (PF) 100 MCG/2ML IJ SOLN
INTRAMUSCULAR | Status: DC | PRN
  Administered 2016-07-31 (×2): 25 ug via INTRAVENOUS
  Administered 2016-07-31: 50 ug via INTRAVENOUS
  Administered 2016-07-31: 25 ug via INTRAVENOUS
  Administered 2016-07-31: 50 ug via INTRAVENOUS

## 2016-07-31 MED ORDER — PHENOL 1.4 % MT LIQD: 1.0000 | OROMUCOSAL | Status: DC | PRN

## 2016-07-31 MED ORDER — MORPHINE SULFATE (PF) 2 MG/ML IV SOLN: 2.0000 mg | INTRAVENOUS | Status: DC | PRN

## 2016-07-31 MED ORDER — FENTANYL CITRATE (PF) 250 MCG/5ML IJ SOLN
INTRAMUSCULAR | Status: AC
  Filled 2016-07-31: qty 5

## 2016-07-31 MED ORDER — SODIUM CHLORIDE 0.9 % IV SOLN: 500.0000 mL | Freq: Once | INTRAVENOUS | Status: DC | PRN

## 2016-07-31 MED ORDER — HYDROMORPHONE HCL 1 MG/ML IJ SOLN: 0.2500 mg | INTRAMUSCULAR | Status: DC | PRN

## 2016-07-31 MED ORDER — CEFUROXIME SODIUM 1.5 G IV SOLR
1.5000 g | Freq: Two times a day (BID) | INTRAVENOUS | Status: AC
  Administered 2016-07-31 – 2016-08-01 (×2): 1.5 g via INTRAVENOUS
  Filled 2016-07-31 (×2): qty 1.5

## 2016-07-31 MED ORDER — ACETAMINOPHEN 650 MG RE SUPP: 325.0000 mg | RECTAL | Status: DC | PRN

## 2016-07-31 MED ORDER — DEXTROSE 5 % IV SOLN
1.5000 g | INTRAVENOUS | Status: AC
  Administered 2016-07-31: 1.5 g via INTRAVENOUS
  Filled 2016-07-31: qty 1.5

## 2016-07-31 MED ORDER — LACTATED RINGERS IV SOLN
INTRAVENOUS | Status: DC | PRN
  Administered 2016-07-31: 07:00:00 via INTRAVENOUS

## 2016-07-31 MED ORDER — HYDRALAZINE HCL 20 MG/ML IJ SOLN: 5.0000 mg | INTRAMUSCULAR | Status: DC | PRN

## 2016-07-31 MED ORDER — DIPHENHYDRAMINE HCL 50 MG/ML IJ SOLN
50.0000 mg | INTRAMUSCULAR | Status: DC
  Filled 2016-07-31: qty 1

## 2016-07-31 MED ORDER — PHENYLEPHRINE HCL 10 MG/ML IJ SOLN
INTRAVENOUS | Status: DC | PRN
  Administered 2016-07-31: 15 ug/min via INTRAVENOUS

## 2016-07-31 MED ORDER — ACETAMINOPHEN 325 MG PO TABS: 325.0000 mg | ORAL_TABLET | ORAL | Status: DC | PRN

## 2016-07-31 MED ORDER — FUROSEMIDE 40 MG PO TABS: 40.0000 mg | ORAL_TABLET | Freq: Every day | ORAL | Status: DC | PRN

## 2016-07-31 MED ORDER — DILTIAZEM HCL ER COATED BEADS 180 MG PO CP24
360.0000 mg | ORAL_CAPSULE | Freq: Every day | ORAL | Status: DC
Start: 2016-07-31 — End: 2016-08-01
  Administered 2016-08-01: 360 mg via ORAL
  Filled 2016-07-31 (×2): qty 2

## 2016-07-31 MED ORDER — PROMETHAZINE HCL 25 MG/ML IJ SOLN: 6.2500 mg | INTRAMUSCULAR | Status: DC | PRN

## 2016-07-31 MED ORDER — MAGNESIUM SULFATE 2 GM/50ML IV SOLN: 2.0000 g | Freq: Every day | INTRAVENOUS | Status: DC | PRN

## 2016-07-31 MED ORDER — PANTOPRAZOLE SODIUM 40 MG PO TBEC
40.0000 mg | DELAYED_RELEASE_TABLET | Freq: Every day | ORAL | Status: DC
  Administered 2016-07-31 – 2016-08-01 (×2): 40 mg via ORAL
  Filled 2016-07-31 (×2): qty 1

## 2016-07-31 MED ORDER — PROTAMINE SULFATE 10 MG/ML IV SOLN
INTRAVENOUS | Status: DC | PRN
  Administered 2016-07-31: 40 mg via INTRAVENOUS
  Administered 2016-07-31: 10 mg via INTRAVENOUS

## 2016-07-31 MED ORDER — SODIUM CHLORIDE 0.9 % IV SOLN: INTRAVENOUS | Status: DC

## 2016-07-31 MED ORDER — DIPHENHYDRAMINE HCL 50 MG/ML IJ SOLN
INTRAMUSCULAR | Status: DC | PRN
  Administered 2016-07-31: 50 mg via INTRAVENOUS

## 2016-07-31 MED ORDER — CLOPIDOGREL BISULFATE 75 MG PO TABS
75.0000 mg | ORAL_TABLET | Freq: Every day | ORAL | Status: DC
  Administered 2016-08-01: 75 mg via ORAL
  Filled 2016-07-31: qty 1

## 2016-07-31 MED ORDER — OXYCODONE HCL 5 MG PO TABS: 5.0000 mg | ORAL_TABLET | Freq: Four times a day (QID) | ORAL | 0 refills | Status: DC | PRN

## 2016-07-31 MED ORDER — NITROGLYCERIN 0.4 MG SL SUBL: 0.4000 mg | SUBLINGUAL_TABLET | SUBLINGUAL | Status: DC | PRN

## 2016-07-31 MED ORDER — METOPROLOL TARTRATE 100 MG PO TABS
100.0000 mg | ORAL_TABLET | Freq: Two times a day (BID) | ORAL | Status: DC
Start: 2016-07-31 — End: 2016-08-01
  Administered 2016-07-31 – 2016-08-01 (×2): 100 mg via ORAL
  Filled 2016-07-31 (×2): qty 1

## 2016-07-31 MED ORDER — PROPOFOL 10 MG/ML IV BOLUS
INTRAVENOUS | Status: AC
  Filled 2016-07-31: qty 20

## 2016-07-31 MED ORDER — METHYLPREDNISOLONE SODIUM SUCC 125 MG IJ SOLR
INTRAMUSCULAR | Status: DC | PRN
  Administered 2016-07-31: 125 mg via INTRAVENOUS

## 2016-07-31 MED ORDER — MIDAZOLAM HCL 2 MG/2ML IJ SOLN
1.0000 mg | Freq: Once | INTRAMUSCULAR | Status: AC
  Administered 2016-07-31: 1 mg via INTRAVENOUS

## 2016-07-31 MED ORDER — POLYETHYLENE GLYCOL 3350 17 G PO PACK: 17.0000 g | PACK | Freq: Every day | ORAL | Status: DC | PRN

## 2016-07-31 MED ORDER — OXYCODONE HCL 5 MG PO TABS: 5.0000 mg | ORAL_TABLET | Freq: Four times a day (QID) | ORAL | Status: DC | PRN

## 2016-07-31 MED ORDER — SODIUM CHLORIDE 0.9 % IV SOLN
INTRAVENOUS | Status: DC
  Administered 2016-07-31: 14:00:00 via INTRAVENOUS

## 2016-07-31 MED ORDER — GUAIFENESIN-DM 100-10 MG/5ML PO SYRP: 15.0000 mL | ORAL_SOLUTION | ORAL | Status: DC | PRN

## 2016-07-31 MED ORDER — BISACODYL 10 MG RE SUPP: 10.0000 mg | Freq: Every day | RECTAL | Status: DC | PRN

## 2016-07-31 SURGICAL SUPPLY — 62 items
ADH SKN CLS APL DERMABOND .7 (GAUZE/BANDAGES/DRESSINGS) ×1
BAG SNAP BAND KOVER 36X36 (MISCELLANEOUS) ×3 IMPLANT
BLADE CLIPPER SURG (BLADE) ×3 IMPLANT
CANISTER SUCT 3000ML PPV (MISCELLANEOUS) ×3 IMPLANT
CATH ANGIO 5F BER2 65CM (CATHETERS) ×3 IMPLANT
CATH BEACON 5.038 65CM KMP-01 (CATHETERS) IMPLANT
CATH OMNI FLUSH .035X70CM (CATHETERS) ×3 IMPLANT
COVER PROBE W GEL 5X96 (DRAPES) ×3 IMPLANT
DERMABOND ADVANCED (GAUZE/BANDAGES/DRESSINGS) ×2
DERMABOND ADVANCED .7 DNX12 (GAUZE/BANDAGES/DRESSINGS) ×1 IMPLANT
DEVICE CLOSURE PERCLS PRGLD 6F (VASCULAR PRODUCTS) ×6 IMPLANT
DRAPE ZERO GRAVITY STERILE (DRAPES) ×3 IMPLANT
DRSG TEGADERM 2-3/8X2-3/4 SM (GAUZE/BANDAGES/DRESSINGS) ×6 IMPLANT
DRYSEAL FLEXSHEATH 12FR 33CM (SHEATH) ×2
DRYSEAL FLEXSHEATH 16FR 33CM (SHEATH) ×2
ELECT CAUTERY BLADE 6.4 (BLADE) ×3 IMPLANT
ELECT REM PT RETURN 9FT ADLT (ELECTROSURGICAL) ×6
ELECTRODE REM PT RTRN 9FT ADLT (ELECTROSURGICAL) ×2 IMPLANT
EXCLUDER TNK 26X14.5MMX12CM (Endovascular Graft) ×1 IMPLANT
EXCLUDER TRUNK 26X14.5MMX12CM (Endovascular Graft) ×3 IMPLANT
GAUZE SPONGE 2X2 8PLY STRL LF (GAUZE/BANDAGES/DRESSINGS) ×2 IMPLANT
GLOVE BIOGEL PI IND STRL 7.5 (GLOVE) ×1 IMPLANT
GLOVE BIOGEL PI INDICATOR 7.5 (GLOVE) ×2
GLOVE SURG SS PI 7.5 STRL IVOR (GLOVE) ×3 IMPLANT
GOWN STRL REUS W/ TWL LRG LVL3 (GOWN DISPOSABLE) ×2 IMPLANT
GOWN STRL REUS W/ TWL XL LVL3 (GOWN DISPOSABLE) ×1 IMPLANT
GOWN STRL REUS W/TWL LRG LVL3 (GOWN DISPOSABLE) ×6
GOWN STRL REUS W/TWL XL LVL3 (GOWN DISPOSABLE) ×3
GRAFT BALLN CATH 65CM (STENTS) ×1 IMPLANT
GUIDEWIRE ANGLED .035X150CM (WIRE) ×3 IMPLANT
HEMOSTAT SNOW SURGICEL 2X4 (HEMOSTASIS) IMPLANT
KIT BASIN OR (CUSTOM PROCEDURE TRAY) ×3 IMPLANT
KIT ROOM TURNOVER OR (KITS) ×3 IMPLANT
LEG CONTRALATERAL 16X12X12 (Vascular Products) ×3 IMPLANT
LEG CONTRALATERAL 16X14.5X10 (Vascular Products) ×3 IMPLANT
NEEDLE PERC 18GX7CM (NEEDLE) ×3 IMPLANT
NS IRRIG 1000ML POUR BTL (IV SOLUTION) ×3 IMPLANT
PACK ENDOVASCULAR (PACKS) ×3 IMPLANT
PAD ARMBOARD 7.5X6 YLW CONV (MISCELLANEOUS) ×6 IMPLANT
PENCIL BUTTON HOLSTER BLD 10FT (ELECTRODE) ×3 IMPLANT
PERCLOSE PROGLIDE 6F (VASCULAR PRODUCTS) ×18
SHEATH AVANTI 11CM 8FR (MISCELLANEOUS) IMPLANT
SHEATH BRITE TIP 8FR 23CM (MISCELLANEOUS) ×3 IMPLANT
SHEATH DRYSEAL FLEX 12FR 33CM (SHEATH) ×1 IMPLANT
SHEATH DRYSEAL FLEX 16FR 33CM (SHEATH) ×1 IMPLANT
SHEATH PINNACLE 8F 10CM (SHEATH) ×3 IMPLANT
SHIELD RADPAD SCOOP 12X17 (MISCELLANEOUS) ×6 IMPLANT
SPONGE GAUZE 2X2 STER 10/PKG (GAUZE/BANDAGES/DRESSINGS) ×4
STENT GRAFT BALLN CATH 65CM (STENTS) ×2
STOPCOCK MORSE 400PSI 3WAY (MISCELLANEOUS) ×3 IMPLANT
SUT ETHILON 3 0 PS 1 (SUTURE) ×3 IMPLANT
SUT PROLENE 5 0 C 1 24 (SUTURE) IMPLANT
SUT VIC AB 2-0 CT1 27 (SUTURE)
SUT VIC AB 2-0 CT1 TAPERPNT 27 (SUTURE) IMPLANT
SUT VIC AB 3-0 SH 27 (SUTURE)
SUT VIC AB 3-0 SH 27X BRD (SUTURE) IMPLANT
SUT VICRYL 4-0 PS2 18IN ABS (SUTURE) ×3 IMPLANT
SYR 30ML LL (SYRINGE) ×3 IMPLANT
TRAY FOLEY W/METER SILVER 16FR (SET/KITS/TRAYS/PACK) ×3 IMPLANT
TUBING HIGH PRESSURE 120CM (CONNECTOR) ×3 IMPLANT
WIRE AMPLATZ SS-J .035X180CM (WIRE) ×6 IMPLANT
WIRE BENTSON .035X145CM (WIRE) ×6 IMPLANT

## 2016-07-31 NOTE — Progress Notes (Signed)
Patient is active with [facility] Chick) for [facility] services as prior to admission [facility] [phone]

## 2016-07-31 NOTE — Anesthesia Preprocedure Evaluation (Signed)
Anesthesia Evaluation  Patient identified by MRN, date of birth, ID band Patient awake    Reviewed: Allergy & Precautions, NPO status , Patient's Chart, lab work & pertinent test results  Airway Mallampati: II  TM Distance: <3 FB Neck ROM: Full    Dental  (+) Poor Dentition, Missing   Pulmonary COPD, former smoker,    breath sounds clear to auscultation       Cardiovascular hypertension, + angina + CAD, + Cardiac Stents, + Peripheral Vascular Disease and +CHF   Rhythm:Irregular Rate:Normal     Neuro/Psych    GI/Hepatic negative GI ROS, Neg liver ROS,   Endo/Other  diabetes, Poorly Controlled  Renal/GU Renal InsufficiencyRenal disease     Musculoskeletal   Abdominal (+) + obese,   Peds  Hematology   Anesthesia Other Findings   Reproductive/Obstetrics                             Anesthesia Physical Anesthesia Plan  ASA: IV  Anesthesia Plan: General   Post-op Pain Management:    Induction: Intravenous  PONV Risk Score and Plan: 3 and Ondansetron, Dexamethasone, Propofol and Midazolam  Airway Management Planned: Oral ETT  Additional Equipment:   Intra-op Plan:   Post-operative Plan: Extubation in OR and Possible Post-op intubation/ventilation  Informed Consent: I have reviewed the patients History and Physical, chart, labs and discussed the procedure including the risks, benefits and alternatives for the proposed anesthesia with the patient or authorized representative who has indicated his/her understanding and acceptance.   Dental advisory given  Plan Discussed with: CRNA  Anesthesia Plan Comments:         Anesthesia Quick Evaluation

## 2016-07-31 NOTE — Progress Notes (Signed)
Patient experience a decrease in HR into the 30's unsustained, pt. Asymptomatic throughout.  Pt. Currently HR sustaining in the 50's.  Dr. Trula Slade updated, received no new orders at this time.

## 2016-07-31 NOTE — Anesthesia Procedure Notes (Signed)
Procedure Name: Intubation Date/Time: 07/31/2016 7:46 AM Performed by: Clearnce Sorrel Pre-anesthesia Checklist: Patient identified, Emergency Drugs available, Suction available, Patient being monitored and Timeout performed Patient Re-evaluated:Patient Re-evaluated prior to inductionOxygen Delivery Method: Circle system utilized Preoxygenation: Pre-oxygenation with 100% oxygen Intubation Type: IV induction Ventilation: Mask ventilation without difficulty and Oral airway inserted - appropriate to patient size Laryngoscope Size: Mac and 3 Grade View: Grade III Tube type: Oral Number of attempts: 1 Airway Equipment and Method: Stylet Placement Confirmation: positive ETCO2 and breath sounds checked- equal and bilateral Secured at: 23 cm Tube secured with: Tape Dental Injury: Teeth and Oropharynx as per pre-operative assessment

## 2016-07-31 NOTE — Anesthesia Postprocedure Evaluation (Signed)
Anesthesia Post Note  Patient: Jason Fisher  Procedure(s) Performed: Procedure(s) (LRB): ABDOMINAL AORTIC ENDOVASCULAR STENT GRAFT (N/A)     Patient location during evaluation: PACU Anesthesia Type: General Level of consciousness: awake and alert Pain management: pain level controlled Vital Signs Assessment: post-procedure vital signs reviewed and stable Respiratory status: spontaneous breathing, nonlabored ventilation, respiratory function stable and patient connected to nasal cannula oxygen Cardiovascular status: blood pressure returned to baseline and stable Postop Assessment: no signs of nausea or vomiting Anesthetic complications: no    Last Vitals:  Vitals:   07/31/16 1248 07/31/16 1256  BP: (!) 142/83   Pulse: (!) 36 (!) 51  Resp: 18 16  Temp:  36.7 C    Last Pain:  Vitals:   07/31/16 0552  TempSrc: Oral                 Ronie Barnhart,JAMES TERRILL

## 2016-07-31 NOTE — H&P (Signed)
Vascular and Vein Specialist of [facility]  Patient name: Jason Fisher        MRN: [id number]        DOB: 1940-07-31          Sex: male   REASON FOR VISIT:    F/u AAA  HISOTRY OF PRESENT ILLNESS:    Jason Schue Newsomeis a 76 y.o.malereturns today for follow-up of his abdominal aortic aneurysm.  The patient was admitted in August 9485 with diastolic heart failure. He underwent stenting of the second obtuse marginal. He developed pneumonia after his intervention. He has a history of STEMI in 2007 requiring stenting 2. He had in-stent stenosis in 2009 which was treated with drug-eluting stent. He is medically managed for hypertension with an ACE inhibitor. He takes a statin for hypercholesterolemia. The patient is a diabetic his most recent hemoglobin A1c was 6.6. He is a former smoker. He does have chronic renal insufficiency. He has a history of atrial fibrillation for which he takes Eliquis. He is also on Plavix.   He was recently hospitalized for atrial fibrillation exacerbation.  PAST MEDICAL HISTORY:       Past Medical History:  Diagnosis Date  . 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 BENIGN PROSTATIC HYPERTROPHY, HX OF 02/24/2007  . CAD S/P PCI    a. 2007 Inf STEMI s/p Vision BMS  2 to RCA;  b. 2009 ISR Prox RCA Stent-->with DES;  c. 09/2015 PCI: LM nl, LAD 30p/m, D1 90, LCX nl, OM1 small, 80, OM2 90 (2.5x14 Biofreedom stent), OM3 small, RCA 30p ISR, 7m ISR, 81m, 85d, EF 50-55%.  . 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 Chronic atrial fibrillation ([place]) 11/06/2009   a. On Eliquis (CHA2DS2VASc = 5).  Rate controlled w/ BB and CCB.  Marland Kitchen Chronic diastolic CHF (congestive heart failure) ([place])    a. 09/2015 Echo: EF 55-60%, mildly dil LA.  . CKD (chronic kidney disease), stage III   . COLONIC POLYPS, HX OF 11/02/2006  . DIABETES MELLITUS, TYPE II 10/29/2006  . Diverticulosis   . History of tobacco abuse    a.  Quit 09/2015.  Marland Kitchen HYPERLIPIDEMIA 10/29/2006  . Hypertensive heart disease with heart failure ([place])      FAMILY HISTORY:        Family History  Problem Relation Age of Onset  . Heart attack Mother   . Diabetes Mother   . Heart attack Father   . Hypertension Father   . Heart attack Brother   . Diabetes Brother     SOCIAL HISTORY:         Social History  Substance Use Topics  . Smoking status: Former Smoker    Types: Cigarettes    Quit date: 09/08/2015  . Smokeless tobacco: Never Used     Comment: passive smoker as well  . Alcohol use No     ALLERGIES:       Allergies  Allergen Reactions  . Contrast Media [Iodinated Diagnostic Agents] Anaphylaxis     CURRENT MEDICATIONS:         Current Outpatient Prescriptions  Medication Sig Dispense Refill  . atorvastatin (LIPITOR) 20 MG tablet TAKE 1 TABLET BY MOUTH EVERY DAY 90 tablet 1  . clopidogrel (PLAVIX) 75 MG tablet TAKE 1 TABLET BY MOUTH EVERY DAY 90 tablet 3  . [facility] ALLERGY 25 MG tablet TAKE 2 TABLETS 1 HOUR PRIOR TO CTA ON 06/18/16. TAKE WITH FINAL DOSE OF PREDNISONE  0  . diltiazem (CARDIZEM CD)  360 MG 24 hr capsule Take 1 capsule (360 mg total) by mouth daily. 90 capsule 4  . diphenhydrAMINE (BENADRYL) 50 MG capsule Take one capsule 1 hr. prior to CTA Abd/ Pelvis on 06/18/16 (to be taken with final dose of Prednisone) 1 capsule 0  . ELIQUIS 5 MG TABS tablet TAKE 1 TABLET (5 MG TOTAL) BY MOUTH 2 (TWO) TIMES DAILY. 60 tablet 5  . furosemide (LASIX) 40 MG tablet Take 2 tablets (80 mg total) by mouth daily. (Patient taking differently: Take 40 mg by mouth daily. ) 60 tablet 5  . glimepiride (AMARYL) 4 MG tablet Take 0.5 tablets (2 mg total) by mouth daily with breakfast. 90 tablet 3  . levalbuterol (XOPENEX HFA) 45 MCG/ACT inhaler Inhale 1-2 puffs into the lungs every 6 (six) hours as needed for wheezing. 1 Inhaler 6  . metFORMIN (GLUCOPHAGE) 1000 MG tablet TAKE 1 TABLET TWICE DAILY WITH A MEAL  180 tablet 1  . metFORMIN (GLUCOPHAGE) 1000 MG tablet TAKE 1 TABLET TWICE DAILY WITH A MEAL 180 tablet 2  . metoprolol (LOPRESSOR) 100 MG tablet Take 1 tablet (100 mg total) by mouth 2 (two) times daily. 180 tablet 1  . nitroGLYCERIN (NITROSTAT) 0.4 MG SL tablet Place 1 tablet (0.4 mg total) under the tongue every 5 (five) minutes as needed for chest pain. X 3 doses 25 tablet 4  . polyethylene glycol (MIRALAX / GLYCOLAX) packet Take 17 g by mouth daily as needed for mild constipation.    . Tiotropium Bromide-Olodaterol (STIOLTO RESPIMAT) 2.5-2.5 MCG/ACT AERS Inhale 2 puffs into the lungs daily. 1 Inhaler 0  . VICTOZA 18 MG/3ML SOPN INJECT 0.3MLS INTO SKIN DAILY 9 mL 1  . oxyCODONE (OXY IR/ROXICODONE) 5 MG immediate release tablet Take 1 tablet (5 mg total) by mouth every 6 (six) hours as needed for moderate pain or severe pain. (Patient not taking: Reported on 06/22/2016) 30 tablet 0  . predniSONE (DELTASONE) 50 MG tablet Take one tab @ 13 hrs prior, 7 hrs prior, and 1 hr. prior to CTA Abdomen and Pelvis on 06/18/16 (Patient not taking: Reported on 06/22/2016) 3 tablet 0   No current facility-administered medications for this visit.     REVIEW OF SYSTEMS:   [X]  denotes positive finding, [ ]  denotes negative finding Cardiac  Comments:  Chest pain or chest pressure:    Shortness of breath upon exertion:    Short of breath when lying flat:    Irregular heart rhythm:        Vascular    Pain in calf, thigh, or hip brought on by ambulation:    Pain in feet at night that wakes you up from your sleep:     Blood clot in your veins:    Leg swelling:         Pulmonary    Oxygen at home:    Productive cough:     Wheezing:         Neurologic    Sudden weakness in arms or legs:     Sudden numbness in arms or legs:     Sudden onset of difficulty speaking or slurred speech:    Temporary loss of vision in one eye:     Problems with dizziness:          Gastrointestinal    Blood in stool:     Vomited blood:         Genitourinary    Burning when urinating:     Blood in urine:  Psychiatric    Major depression:         Hematologic    Bleeding problems:    Problems with blood clotting too easily:        Skin    Rashes or ulcers:        Constitutional    Fever or chills:      PHYSICAL EXAM:       Vitals:   06/22/16 1343 06/22/16 1345  BP: (!) 158/96 (!) 151/92  Pulse: 74 78  Resp: 18   Temp: (!) 96.9 F (36.1 C)   SpO2: 96%   Weight: 194 lb (88 kg)   Height: 5\' 10"  (1.778 m)     GENERAL: The patient is a well-nourished male, in no acute distress. The vital signs are documented above. CARDIAC: There is a regular rate and rhythm.  VASCULAR: I cannot palpate pedal pulses today PULMONARY: Non-labored respirations ABDOMEN: Soft and non-tender with normal pitched bowel sounds.  MUSCULOSKELETAL: There are no major deformities or cyanosis. NEUROLOGIC: No focal weakness or paresthesias are detected. SKIN: There are no ulcers or rashes noted. PSYCHIATRIC: The patient has a normal affect.  STUDIES:  I have reviewed the below studies and summarized their findings:  ABI  R 0.99   L 1.01 Carotid 1-39% bilateral  CTA:    5.0cm AAA  MEDICAL ISSUES:   AAA:  The patient's aneurysm has increased in size over the past 6 months.  It now measures 5 cm and I have recommended repair.  We discussed the area above his renal arteries that is irregular.  I do not think this needs to be treated at this time.  I did state that this could dilate over time and could require repair at a later date.  I plan is for straightforward endovascular repair.  We discussed risks and benefits of the operation including the risk of cardiopulmonary complications, intestinal ischemia, renal complications, bleeding, death.  All his questions were answered.  We have scheduled his  operation for Wednesday, June 27.  He will need to get cardiac clearance from Dr. Martinique.  I would like for him to be off of his Plavix and his Eliquis for his surgery.    Annamarie Major, MD [facility] of [facility] [phone] Pager [phone]      No changes from clinic visit above.  Has received cardiac clearance.   PE: CV: RRR PULM:CTA ABD soft  A/P  AAA Discussed risks and benefits of EVAR again.  All questions qnswered.  Needs to work on DM control   [employer]

## 2016-07-31 NOTE — Progress Notes (Signed)
  Day of Surgery Note    Subjective:  Says he has to pee  Vitals:   07/31/16 0552 07/31/16 0959  BP: (!) 194/97   Pulse: 69   Resp: 20   Temp: 97.5 F (36.4 C) 98.9 F (37.2 C)    Incisions:   Bilateral groins are soft without hematoma Extremities:  monophasic doppler signals bilateral DP/PT; bilateral feet are warm Cardiac:  irregular Lungs:  Non labored Abdomen:  Soft NT/ND   Assessment/Plan:  This is a 76 y.o. male who is s/p  Procedure:   #1: Endovascular repair of abdominal aortic aneurysm                         #2: Ultrasound-guided bilateral common femoral artery access                         #3: Catheter in aorta 2                         #4: Abdominal aortogram  -pt in recovery room with monophasic doppler signals bilateral DP/PT -in 4 point restraints as he was trying to get out of bed -to [place] when bed available   Leontine Locket, PA-C 07/31/2016 10:32 AM [phone]

## 2016-07-31 NOTE — Op Note (Addendum)
Patient name: Jason Fisher MRN: [id number] DOB: [birth year] Sex: male  07/31/2016 Pre-operative Diagnosis: AAA Post-operative diagnosis:  Same Surgeon:  Annamarie Major Assistants:  Leontine Locket Procedure:   #1: Endovascular repair of abdominal aortic aneurysm   #2: Ultrasound-guided bilateral common femoral artery access   #3: Catheter in aorta 2   #4: Abdominal aortogram    #5:  Distal extension Anesthesia:  General Blood Loss:  See anesthesia record Specimens:  None  Findings:  Complete exclusion.  Short right common iliac artery Devices used: Main body was Gore 26 x 14 x 12.  Contralateral left 12 x 12.  Ipsilateral right 14 x 10  Indications:  The patient has an expanding infrarenal abdominal aortic aneurysm.  He comes in today for endovascular repair.  Procedure:  The patient was identified in the holding area and taken to [place] 16  The patient was then placed supine on the table. general anesthesia was administered.  The patient was prepped and draped in the usual sterile fashion.  A time out was called and antibiotics were administered.  Ultrasound was used to evaluate bilateral common femoral arteries.  There are mildly calcified but patent.  A #11 blade was used to make a skin nick.  Under ultrasound guidance, bilateral common femoral arteries were cannulated with an 18-gauge needle.  An 035 wire was advanced without resistance.  The subcutaneous tract was dilated with an 8 [place] dilator.  Provide devices were deployed at 11:00 and 1:00 on the right.  I was unable to get a second probe glide then on the left and therefore one was deployed at the 11:00 position.  8 French sheaths were placed bilaterally.  The patient was fully heparinized.  The patient was administered 125 mg Solu-Medrol and 50 mg of Benadryl for his contrast allergy.  Of the left side, a Omni flush catheter was placed at the level of L1.  Over Amplatz Super Stiff wire, a 16 French sheath was advanced up the  right side.  The main body device was prepared on the back table and inserted through the right-sided sheath.  This was a Dealer 26 x 14 x 12.  An abdominal aortogram was performed locating the renal arteries.  The device was then deployed down to the contralateral gate, landing at the level of the left renal artery which was the lowest renal artery.  Next, the contralateral gate was cannulated with a Berenstein 2 catheter and a Careers adviser.  A Omni flush catheter was able to be freely rotated within the main body, confirming successful cannulation.  An Amplatz superstiff wire was then inserted.  The image detector was rotated to a right anterior oblique position and a retrograde injection was performed through the left sheath, locating the left hypogastric artery.  The 8 French sheath and Omni flush catheter were removed, and a 12 French sheath was inserted into the contralateral gate.  I selected the left limb which was a Gore 12 x 12 device.  It was inserted and then deployed landing at the level of the left hypogastric artery.  Next the image detector was rotated to a left anterior oblique position.  I then deployed the remaining portion of the ipsilateral limb.  A retrograde sheath injection was performed locating the right hypogastric artery.  I then selected a Gore 14 x 10 device and inserted this through the sheath after the delivery system had been removed from the main body.  The device was positioned  at the level of the right hypogastric artery.  The patient was noted to have a short common iliac artery.  The device was deployed landing right at the level of the right hypogastric artery.  Next, a Q 50 balloon was used to mold the proximal and distal attachment sites as well as device overlap.  Completion imaging was then performed which showed continued patency of bilateral renal arteries bilateral common iliac and hypogastric arteries.  There was a questionable type I versus a type II endoleak and  therefore I reinserted the Q-50 balloon and repeated the balloon molding of the main body portion of the graft.  A additional abdominal aorta gram wasn't performed and I did not longer see the leak.  The aneurysm was successfully excluded.  At this point, 035 Bentson wires were placed on both sides.  The right sheath was removed and the pro-glide devices were secured closing the arteriotomy.  There was good hemostasis.  Similarly I closed the left femoral artery.  There was only one pro-glide device on this side and successfully closed the groin.  50 mg of protamine was given.  Manual pressure was held for 5 minutes.  Both groins were hemostatic.  I used Bovie cautery and Dermabond to close the wound.  The patient had good Doppler signals in his feet after the procedure.  He was successfully awakened from anesthesia and taken to recovery in stable condition.  There were no immediate complications.   Disposition:  To PACU stable   V. Annamarie Major, M.D. [facility] Specialists of [place] Office: [phone] Pager:  [phone]

## 2016-07-31 NOTE — Transfer of Care (Signed)
Immediate Anesthesia Transfer of Care Note  Patient: Jason Fisher  Procedure(s) Performed: Procedure(s): ABDOMINAL AORTIC ENDOVASCULAR STENT GRAFT (N/A)  Patient Location: PACU  Anesthesia Type:General  Level of Consciousness: awake and alert   Airway & Oxygen Therapy: Patient Spontanous Breathing and Patient connected to face mask oxygen  Post-op Assessment: Report given to RN and Post -op Vital signs reviewed and stable  Post vital signs: Reviewed and stable  Last Vitals:  Vitals:   07/31/16 0552  BP: (!) 194/97  Pulse: 69  Resp: 20  Temp: 36.4 C    Last Pain:  Vitals:   07/31/16 0552  TempSrc: Oral         Complications: No apparent anesthesia complications

## 2016-07-31 NOTE — Progress Notes (Signed)
Pt in pacu, pulling atl lines, kicking feet, yelling at staff, Dr Trula Slade aware, Dr Orene Desanctis aware and versed given

## 2016-08-01 LAB — CBC
HEMATOCRIT: 39.3 % (ref 39.0–52.0)
Hemoglobin: 13.1 g/dL (ref 13.0–17.0)
MCH: 27.6 pg (ref 26.0–34.0)
MCHC: 33.3 g/dL (ref 30.0–36.0)
MCV: 82.9 fL (ref 78.0–100.0)
PLATELETS: 145 10*3/uL — AB (ref 150–400)
RBC: 4.74 MIL/uL (ref 4.22–5.81)
RDW: 14.8 % (ref 11.5–15.5)
WBC: 13.2 10*3/uL — ABNORMAL HIGH (ref 4.0–10.5)

## 2016-08-01 LAB — BASIC METABOLIC PANEL
Anion gap: 9 (ref 5–15)
BUN: 17 mg/dL (ref 6–20)
CO2: 24 mmol/L (ref 22–32)
Calcium: 8.3 mg/dL — ABNORMAL LOW (ref 8.9–10.3)
Chloride: 106 mmol/L (ref 101–111)
Creatinine, Ser: 1.4 mg/dL — ABNORMAL HIGH (ref 0.61–1.24)
GFR calc Af Amer: 55 mL/min — ABNORMAL LOW (ref >60)
GFR, EST NON AFRICAN AMERICAN: 48 mL/min — AB (ref >60)
GLUCOSE: 256 mg/dL — AB (ref 65–99)
POTASSIUM: 3.6 mmol/L (ref 3.5–5.1)
Sodium: 139 mmol/L (ref 135–145)

## 2016-08-01 LAB — GLUCOSE, CAPILLARY: Glucose-Capillary: 279 mg/dL — ABNORMAL HIGH (ref 65–99)

## 2016-08-01 MED ORDER — FUROSEMIDE 40 MG PO TABS: 40.0000 mg | ORAL_TABLET | ORAL | Status: DC

## 2016-08-01 MED ORDER — APIXABAN 5 MG PO TABS
5.0000 mg | ORAL_TABLET | Freq: Two times a day (BID) | ORAL | Status: DC
  Administered 2016-08-01: 5 mg via ORAL
  Filled 2016-08-01: qty 1

## 2016-08-01 NOTE — Progress Notes (Signed)
Per [facility] patient is not active for [facility] services. Spoke with patient he states he is not receiving [facility] services. Patient declines home needs. No CM needs identified, will DC to home self care today.

## 2016-08-01 NOTE — Discharge Summary (Signed)
[facility] Discharge Summary   Jason Fisher 08/25/1940 76 y.o. male  MRN: [id number]  Admission Date: 07/31/2016  Discharge Date: 08/01/16  Physician: Serafina Mitchell, MD  Admission Diagnosis: Abdominal Aortic Aneurysm  I71.4   HPI:   This is a 76 y.o. male returns today for follow-up of his abdominal aortic aneurysm.  The patient was admitted in August 6160 with diastolic heart failure. He underwent stenting of the second obtuse marginal. He developed pneumonia after his intervention. He has a history of STEMI in 2007 requiring stenting 2. He had in-stent stenosis in 2009 which was treated with drug-eluting stent. He is medically managed for hypertension with an ACE inhibitor. He takes a statin for hypercholesterolemia. The patient is a diabetic his most recent hemoglobin A1c was 6.6. He is a former smoker. He does have chronic renal insufficiency. He has a history of atrial fibrillation for which he takes Eliquis. He is also on Plavix.   He was recently hospitalized for atrial fibrillation exacerbation.  Hospital Course:  The patient was admitted to the hospital and taken to the operating room on 07/31/2016 and underwent: Procedure:   #1: Endovascular repair of abdominal aortic aneurysm                         #2: Ultrasound-guided bilateral common femoral artery access                         #3: Catheter in aorta 2                         #4: Abdominal aortogram                          #5:  Distal extension    The pt tolerated the procedure well and was transported to the PACU in good condition.   By POD 1, he is doing well.  He has Monophasic DP right with palpable right PT; monophasic left DP.  His abdomen is soft.  Bilateral groins soft without hematoma. Hgb stable and Eliquis is restarted.  His foley was out around 0600 and he was able to void.  He was discharged home.  The remainder of the hospital course consisted of increasing mobilization and  increasing intake of solids without difficulty.  CBC    Component Value Date/Time   WBC 13.2 (H) 08/01/2016 0205   RBC 4.74 08/01/2016 0205   HGB 13.1 08/01/2016 0205   HCT 39.3 08/01/2016 0205   PLT 145 (L) 08/01/2016 0205   MCV 82.9 08/01/2016 0205   MCH 27.6 08/01/2016 0205   MCHC 33.3 08/01/2016 0205   RDW 14.8 08/01/2016 0205   LYMPHSABS 1.9 04/30/2016 1233   MONOABS 0.7 04/30/2016 1233   EOSABS 0.2 04/30/2016 1233   BASOSABS 0.1 04/30/2016 1233    BMET    Component Value Date/Time   NA 139 08/01/2016 0205   K 3.6 08/01/2016 0205   CL 106 08/01/2016 0205   CO2 24 08/01/2016 0205   GLUCOSE 256 (H) 08/01/2016 0205   BUN 17 08/01/2016 0205   CREATININE 1.40 (H) 08/01/2016 0205   CALCIUM 8.3 (L) 08/01/2016 0205   GFRNONAA 48 (L) 08/01/2016 0205   GFRAA 55 (L) 08/01/2016 0205       Discharge Instructions    ABDOMINAL PROCEDURE/ANEURYSM REPAIR/AORTO-BIFEMORAL BYPASS:  Call MD for increased abdominal pain; cramping diarrhea;  nausea/vomiting    Complete by:  As directed    Call MD for:  redness, tenderness, or signs of infection (pain, swelling, bleeding, redness, odor or green/yellow discharge around incision site)    Complete by:  As directed    Call MD for:  severe or increased pain, loss or decreased feeling  in affected limb(s)    Complete by:  As directed    Call MD for:  temperature >100.5    Complete by:  As directed    Discharge instructions    Complete by:  As directed    Restart your Metformin on 08/03/16.   Discharge wound care:    Complete by:  As directed    Shower daily with soap and water starting 08/02/16   Driving Restrictions    Complete by:  As directed    No driving for 2 weeks & while taking pain medication.   Lifting restrictions    Complete by:  As directed    No heavy lifting for 4 weeks   Resume previous diet    Complete by:  As directed       Discharge Diagnosis:  Abdominal Aortic Aneurysm  I71.4  Secondary Diagnosis: Patient  Active Problem List   Diagnosis Date Noted  . AAA (abdominal aortic aneurysm) ([place]) 07/31/2016  . Atrial fibrillation with RVR ([place]) 04/22/2016  . Elevated troponin 04/22/2016  . COPD (chronic obstructive pulmonary disease) ([place]) 02/07/2016  . Abdominal aortic aneurysm (AAA) without rupture ([place])   . Perirectal abscess 11/04/2015  . Diabetes mellitus with complication ([place]) 60/11/9321  . Pulmonary nodules 11/04/2015  . Coronary artery disease of bypass graft of native heart with stable angina pectoris ([place])   . HCAP (healthcare-associated pneumonia) 09/29/2015  . Allergic reaction to contrast dye 09/27/2015  . Accelerated hypertension with diastolic congestive heart failure, NYHA class 3 ([place]) 09/27/2015  . Acute respiratory failure with hypoxia ([place]) 09/27/2015  . Unstable angina ([place]) 09/26/2015  . CHF (congestive heart failure) ([place]) 09/25/2015  . Progressive angina ([place]) 09/25/2015  . CKD (chronic kidney disease), stage III   . Acute on chronic combined systolic and diastolic congestive heart failure ([place])   . Diastolic congestive heart failure ([place])   . Paroxysmal atrial fibrillation ([facility])   . Angiodysplasia of colon 04/19/2015  . TOBACCO USER 04/17/2010  . Chronic atrial fibrillation ([place]): CHA2DS2Vasc = 5. On Warfarin. Rate control 11/06/2009  . Occlusion and stenosis of carotid artery without mention of cerebral infarction 07/13/2008  . Abdominal aortic aneurysm ([place]) 07/13/2008  . BENIGN PROSTATIC HYPERTROPHY, HX OF 02/24/2007  . MYOCARDIAL INFARCTION, HX OF 11/02/2006  . CAD S/P percutaneous coronary angioplasty 11/02/2006  . History of colonic polyps 11/02/2006  . DM (diabetes mellitus), type 2 with peripheral vascular complications ([place]) 55/73/2202  . Dyslipidemia 10/29/2006  . Essential hypertension 10/29/2006   Past Medical History:  Diagnosis Date  . 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 BENIGN PROSTATIC HYPERTROPHY, HX OF 02/24/2007  . CAD S/P PCI    a. 2007  Inf STEMI s/p Vision BMS  2 to RCA;  b. 2009 ISR Prox RCA Stent-->with DES;  c. 09/2015 PCI: LM nl, LAD 30p/m, D1 90, LCX nl, OM1 small, 80, OM2 90 (2.5x14 Biofreedom stent), OM3 small, RCA 30p ISR, 63m ISR, 55m, 85d, EF 50-55%.  . 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 Chronic atrial fibrillation ([place]) 11/06/2009   a. On Eliquis (CHA2DS2VASc = 5).  Rate controlled w/ BB and CCB.  Marland Kitchen Chronic diastolic CHF (  congestive heart failure) ([place])    a. 09/2015 Echo: EF 55-60%, mildly dil LA.  . CKD (chronic kidney disease), stage III    they mentioned this to the patient but they would work on it later after the aneurysm  . COLONIC POLYPS, HX OF 11/02/2006  . COPD (chronic obstructive pulmonary disease) ([place])   . DIABETES MELLITUS, TYPE II 10/29/2006  . Diverticulosis   . History of tobacco abuse    a. Quit 09/2015.  Marland Kitchen HYPERLIPIDEMIA 10/29/2006  . Hypertensive heart disease with heart failure ([place])   . Pneumonia 09/2015   [name] 07/31/2016  . STEMI (ST elevation myocardial infarction) ([place]) 2007   /notes 07/31/2016     Allergies as of 08/01/2016      Reactions   Contrast Media [iodinated Diagnostic Agents] Anaphylaxis      Medication List    TAKE these medications   atorvastatin 20 MG tablet Commonly known as:  LIPITOR TAKE 1 TABLET BY MOUTH EVERY DAY   clopidogrel 75 MG tablet Commonly known as:  PLAVIX TAKE 1 TABLET BY MOUTH EVERY DAY   diltiazem 360 MG 24 hr capsule Commonly known as:  CARDIZEM CD Take 1 capsule (360 mg total) by mouth daily.   diphenhydrAMINE 50 MG capsule Commonly known as:  BENADRYL Take one capsule 1 hr. prior to CTA Abd/ Pelvis on 06/18/16 (to be taken with final dose of Prednisone)   ELIQUIS 5 MG Tabs tablet Generic drug:  apixaban TAKE 1 TABLET (5 MG TOTAL) BY MOUTH 2 (TWO) TIMES DAILY.   furosemide 40 MG tablet Commonly known as:  LASIX Take 1 tablet (40 mg total) by mouth See admin instructions. Once daily, then another 40mg  as needed for swelling What  changed:  how much to take  when to take this  additional instructions   glimepiride 4 MG tablet Commonly known as:  AMARYL TAKE 1 TABLET BY MOUTH EVERY MORNING BEFORE BREAKFAST What changed:  See the new instructions.   levalbuterol 45 MCG/ACT inhaler Commonly known as:  XOPENEX HFA Inhale 1-2 puffs into the lungs every 6 (six) hours as needed for wheezing.   metFORMIN 1000 MG tablet Commonly known as:  GLUCOPHAGE TAKE 1 TABLET TWICE DAILY WITH A MEAL   metoprolol tartrate 100 MG tablet Commonly known as:  LOPRESSOR Take 1 tablet (100 mg total) by mouth 2 (two) times daily.   nitroGLYCERIN 0.4 MG SL tablet Commonly known as:  NITROSTAT Place 1 tablet (0.4 mg total) under the tongue every 5 (five) minutes as needed for chest pain. X 3 doses   oxyCODONE 5 MG immediate release tablet Commonly known as:  Oxy IR/ROXICODONE Take 1 tablet (5 mg total) by mouth every 6 (six) hours as needed for moderate pain or severe pain.   polyethylene glycol packet Commonly known as:  MIRALAX / GLYCOLAX Take 17 g by mouth daily as needed for mild constipation.   Tiotropium Bromide-Olodaterol 2.5-2.5 MCG/ACT Aers Commonly known as:  STIOLTO RESPIMAT Inhale 2 puffs into the lungs daily.   VICTOZA 18 MG/3ML Sopn Generic drug:  liraglutide INJECT 0.3MLS INTO SKIN DAILY       Prescriptions given: Oxycodone#10 No Refill  Instructions: 1.  No heavy lifting x 4 weeks 2.  No driving x 2 weeks and while taking pain medication 3.  Shower daily starting 08/02/16  Disposition: home  Patient's condition: is Good  Follow up: 1. Dr. Trula Slade in 4 weeks with CTA protocol   Leontine Locket, PA-C [facility]  [phone] 08/01/2016  7:51 AM   - For VQI Registry use - Post-op:  Time to Extubation: [x]  In OR, [ ]  < 12 hrs, [ ]  12-24 hrs, [ ]  >=24 hrs Vasopressors Req. Post-op: No MI: No., [ ]  Troponin only, [ ]  EKG or Clinical New Arrhythmia: No CHF: No ICU Stay: 1  day in stepdown Transfusion: No     If yes, n/a units given  Complications: Resp failure: No., [ ]  Pneumonia, [ ]  Ventilator Chg in renal function: No., [ ]  Inc. Cr > 0.5, [ ]  Temp. Dialysis,  [ ]  Permanent dialysis Leg ischemia: No., no Surgery needed, [ ]  Yes, Surgery needed,  [ ]  Amputation Bowel ischemia: No., [ ]  Medical Rx, [ ]  Surgical Rx Wound complication: No., [ ]  Superficial separation/infection, [ ]  Return to OR Return to OR: No  Return to OR for bleeding: No Stroke: No., [ ]  Minor, [ ]  Major  Discharge medications: Statin use:  Yes If No:  ASA use:  No  If No: Pt is on Eliquis Plavix use:  Yes  Beta blocker use:  Yes  ARB use:  No ACEI use:  No CCB use:  Yes

## 2016-08-01 NOTE — Progress Notes (Addendum)
  Progress Note    08/01/2016 7:36 AM 1 Day Post-Op  Subjective:  No complaints except the only thing that hurts is the IV's  Afebrile HR  40's-80's  924'Q-683'M systolic 19% RA  Vitals:   07/31/16 2319 08/01/16 0323  BP: 129/77 (!) 155/85  Pulse: (!) 43 77  Resp: 16 14  Temp: 97.8 F (36.6 C) 97.9 F (36.6 C)    Physical Exam: Cardiac:  regular Lungs:  Non labored Incisions:  Bilateral groins are soft without hematoma Extremities:  Monophasic DP right with palpable right PT; monophasic left DP Abdomen:  Soft, NT/ND  CBC    Component Value Date/Time   WBC 13.2 (H) 08/01/2016 0205   RBC 4.74 08/01/2016 0205   HGB 13.1 08/01/2016 0205   HCT 39.3 08/01/2016 0205   PLT 145 (L) 08/01/2016 0205   MCV 82.9 08/01/2016 0205   MCH 27.6 08/01/2016 0205   MCHC 33.3 08/01/2016 0205   RDW 14.8 08/01/2016 0205   LYMPHSABS 1.9 04/30/2016 1233   MONOABS 0.7 04/30/2016 1233   EOSABS 0.2 04/30/2016 1233   BASOSABS 0.1 04/30/2016 1233    BMET    Component Value Date/Time   NA 139 08/01/2016 0205   K 3.6 08/01/2016 0205   CL 106 08/01/2016 0205   CO2 24 08/01/2016 0205   GLUCOSE 256 (H) 08/01/2016 0205   BUN 17 08/01/2016 0205   CREATININE 1.40 (H) 08/01/2016 0205   CALCIUM 8.3 (L) 08/01/2016 0205   GFRNONAA 48 (L) 08/01/2016 0205   GFRAA 55 (L) 08/01/2016 0205    INR    Component Value Date/Time   INR 1.68 07/31/2016 1612     Intake/Output Summary (Last 24 hours) at 08/01/16 0736 Last data filed at 08/01/16 0500  Gross per 24 hour  Intake             1650 ml  Output             1950 ml  Net             -300 ml     Assessment:  76 y.o. male is s/p:  Procedure:#1: Endovascular repair of abdominal aortic aneurysm #2: Ultrasound-guided bilateral common femoral artery access #3: Catheter in aorta 2 #4: Abdominal aortogram  1 Day Post-Op   Plan: -pt doing well this morning.  He  has ambulated and pain is well controlled.  He has not had any pain medication.  He has not voided however, his foley was just out around 0600. -creatinine is stable -hgb slightly down from post op-labs yesterday afternoon most likely not correct as he has not received a blood transfusion. -DVT prophylaxis:  Restart Eliquis this am   Leontine Locket, [place] [facility] [phone] 08/01/2016 7:36 AM  I have interviewed the patient and examined the patient. I agree with the findings by the PA. OK for discharge this AM if able to void.   Gae Gallop, MD [phone]

## 2016-08-01 NOTE — Progress Notes (Signed)
Discharge instructions given to patient, all questions answered at this time.  Prescription given to patient.  Pt. VSS with no s/s of distress noted.  Patient stable at discharge.   

## 2016-08-03 ENCOUNTER — Other Ambulatory Visit: Payer: Self-pay | Admitting: *Deleted

## 2016-08-03 ENCOUNTER — Encounter (HOSPITAL_COMMUNITY): Payer: Self-pay | Admitting: Surgery

## 2016-08-03 DIAGNOSIS — I714 Abdominal aortic aneurysm, without rupture, unspecified: Secondary | ICD-10-CM

## 2016-08-03 DIAGNOSIS — T508X5D Adverse effect of diagnostic agents, subsequent encounter: Secondary | ICD-10-CM

## 2016-08-03 DIAGNOSIS — Z95828 Presence of other vascular implants and grafts: Secondary | ICD-10-CM

## 2016-08-03 LAB — BPAM RBC
Blood Product Expiration Date: 201807252359
Blood Product Expiration Date: 201807252359
ISSUE DATE / TIME: 201806290702
ISSUE DATE / TIME: 201806290702
Unit Type and Rh: 9500
Unit Type and Rh: 9500

## 2016-08-03 LAB — TYPE AND SCREEN
ABO/RH(D): O NEG
Antibody Screen: NEGATIVE
UNIT DIVISION: 0
Unit division: 0

## 2016-08-03 MED ORDER — DIPHENHYDRAMINE HCL 25 MG PO CAPS: ORAL_CAPSULE | ORAL | 1 refills | Status: DC

## 2016-08-03 MED ORDER — PREDNISONE 50 MG PO TABS: ORAL_TABLET | ORAL | 0 refills | Status: DC

## 2016-08-04 ENCOUNTER — Telehealth: Payer: Self-pay | Admitting: Surgery

## 2016-08-04 NOTE — Telephone Encounter (Signed)
-----   Message from Mena Goes, RN sent at 07/31/2016  9:44 AM EDT ----- Regarding: 4 weeks w/ CTA   ----- Message ----- From: Gabriel Earing, PA-C Sent: 07/31/2016   9:39 AM To: [facility] Charge Pool  S/p [facility] 07/31/16.  F/u with Dr. Trula Slade in 4 weeks with CTA protocol.  Thanks

## 2016-08-04 NOTE — Telephone Encounter (Signed)
Sched CTA at [facility] 09/02/16 at 3:00. Sched MD appt 09/07/16 at 10:45. Spoke to pt's wife.

## 2016-08-06 NOTE — Progress Notes (Signed)
Cardiology Office Note    Date:  08/07/2016   ID:  Jason Fisher, DOB 07-20-1940, MRN [id number]  PCP:  Marletta Lor, MD  Cardiologist: Dr. Martinique  Chief Complaint  Patient presents with  . Follow-up  . Atrial Fibrillation  . Coronary Artery Disease  . Hypertension    History of Present Illness:    Jason Fisher is a 76 y.o. male with past medical history of CAD (s/p BMS to RCA in 2007, ISR with placement of RCA DES in 2009, Biofreedom DES to OM2 in 09/2015), chronic atrial fibrillation (on Eliquis), chronic diastolic CHF, HTN, HLD, AAA, COPD, and tobacco use who presents to the office today for hospital follow-up.   He was  admitted from 3/20 - 04/24/2016 for atrial fibrillation with RVR. Troponin values remained flat at 0.04, 0.03, and <0.03.  He was initially placed on IV Cardizem which was switched to Cardizem CD 360mg  daily during admission. He did require IV diuresis for acute on chronic diastolic CHF exacerbation. Weight at time of discharge was 194 lbs with creatinine at 1.49.  He was admitted on 07/31/16 and underwent endovascular stent grafting for a AAA by Dr. Trula Slade. Post op course uncomplicated.   On follow up today he is feeling well. No problems from his stent graft. BP consistently high. Was previously on hydralazine but this was stopped in March.    Past Medical History:  Diagnosis Date  . 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 BENIGN PROSTATIC HYPERTROPHY, HX OF 02/24/2007  . CAD S/P PCI    a. 2007 Inf STEMI s/p Vision BMS  2 to RCA;  b. 2009 ISR Prox RCA Stent-->with DES;  c. 09/2015 PCI: LM nl, LAD 30p/m, D1 90, LCX nl, OM1 small, 80, OM2 90 (2.5x14 Biofreedom stent), OM3 small, RCA 30p ISR, 11m ISR, 59m, 85d, EF 50-55%.  . 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 Chronic atrial fibrillation ([place]) 11/06/2009   a. On Eliquis (CHA2DS2VASc = 5).  Rate controlled w/ BB and CCB.  Marland Kitchen Chronic diastolic CHF (congestive heart failure) ([place])    a. 09/2015 Echo:  EF 55-60%, mildly dil LA.  . CKD (chronic kidney disease), stage III    they mentioned this to the patient but they would work on it later after the aneurysm  . COLONIC POLYPS, HX OF 11/02/2006  . COPD (chronic obstructive pulmonary disease) ([place])   . DIABETES MELLITUS, TYPE II 10/29/2006  . Diverticulosis   . History of tobacco abuse    a. Quit 09/2015.  Marland Kitchen HYPERLIPIDEMIA 10/29/2006  . Hypertensive heart disease with heart failure ([place])   . Pneumonia 09/2015   [name] 07/31/2016  . STEMI (ST elevation myocardial infarction) [facility]) 2007   [name] 07/31/2016    Past Surgical History:  Procedure Laterality Date  . ABDOMINAL AORTIC ENDOVASCULAR STENT GRAFT N/A 07/31/2016   Procedure: ABDOMINAL AORTIC ENDOVASCULAR STENT GRAFT;  Surgeon: Serafina Mitchell, MD;  Location: [facility];  Service: Vascular;  Laterality: N/A;  . CARDIAC CATHETERIZATION N/A 09/26/2015   Procedure: Left Heart Cath and Coronary Angiography;  Surgeon: Jaystin Mcgarvey M Martinique, MD;  Location: [place] CV [facility];  Service: Cardiovascular;  Laterality: N/A;  . CARDIAC CATHETERIZATION N/A 09/26/2015   Procedure: Coronary Stent Intervention;  Surgeon: Zameria Vogl M Martinique, MD;  Location: [place] [facility];  Service: Cardiovascular;  Laterality: N/A;  . CATARACT EXTRACTION Bilateral   . COLONOSCOPY    . CORONARY ANGIOPLASTY WITH STENT PLACEMENT  2007   Inferior  STEMI 2007 - RCA PCI with 2 Vision BMS; Abnormal Myoview in 2009 - pRCA ins-stent restenosis & unstented disease - DES PCI Promus 3.0 x 20 mm (3.5 mm)  . ENDOVASCULAR REPAIR/STENT GRAFT  07/31/2016  . INCISION AND DRAINAGE PERIRECTAL ABSCESS N/A 11/06/2015   Procedure: IRRIGATION AND DEBRIDEMENT PERIRECTAL ABSCESS, POSSIBLE HEMORRHOIDECTOMY;  Surgeon: Coralie Keens, MD;  Location: [place];  Service: General;  Laterality: N/A;  . ORIF SHOULDER FRACTURE Right    "fell out of back end of a truck"    Current Medications: Outpatient Medications Prior to Visit  Medication Sig Dispense Refill  .  atorvastatin (LIPITOR) 20 MG tablet TAKE 1 TABLET BY MOUTH EVERY DAY 90 tablet 1  . clopidogrel (PLAVIX) 75 MG tablet TAKE 1 TABLET BY MOUTH EVERY DAY 90 tablet 3  . diltiazem (CARDIZEM CD) 360 MG 24 hr capsule Take 1 capsule (360 mg total) by mouth daily. 90 capsule 4  . diphenhydrAMINE (BENADRYL) 25 mg capsule Take 2 capsules (50 mg) by mouth 1 hour prior to your procedure. 2 capsule 1  . ELIQUIS 5 MG TABS tablet TAKE 1 TABLET (5 MG TOTAL) BY MOUTH 2 (TWO) TIMES DAILY. 60 tablet 5  . furosemide (LASIX) 40 MG tablet Take 1 tablet (40 mg total) by mouth See admin instructions. Once daily, then another 40mg  as needed for swelling    . glimepiride (AMARYL) 4 MG tablet TAKE 1 TABLET BY MOUTH EVERY MORNING BEFORE BREAKFAST (Patient taking differently: TAKE 1/2 TABLET (2mg ) BY MOUTH EVERY MORNING BEFORE BREAKFAST) 90 tablet 1  . levalbuterol (XOPENEX HFA) 45 MCG/ACT inhaler Inhale 1-2 puffs into the lungs every 6 (six) hours as needed for wheezing. 1 Inhaler 6  . metFORMIN (GLUCOPHAGE) 1000 MG tablet TAKE 1 TABLET TWICE DAILY WITH A MEAL 180 tablet 2  . metoprolol (LOPRESSOR) 100 MG tablet Take 1 tablet (100 mg total) by mouth 2 (two) times daily. 180 tablet 1  . nitroGLYCERIN (NITROSTAT) 0.4 MG SL tablet Place 1 tablet (0.4 mg total) under the tongue every 5 (five) minutes as needed for chest pain. X 3 doses 25 tablet 4  . oxyCODONE (OXY IR/ROXICODONE) 5 MG immediate release tablet Take 1 tablet (5 mg total) by mouth every 6 (six) hours as needed for moderate pain or severe pain. 10 tablet 0  . polyethylene glycol (MIRALAX / GLYCOLAX) packet Take 17 g by mouth daily as needed for mild constipation.    . predniSONE (DELTASONE) 50 MG tablet One tablet (50mg ) 13 hours prior to procedure; one tablet (50mg ) 7 hours prior to procedure and then one tablet (50 mg) one hour prior to procedure. 3 tablet 0  . Tiotropium Bromide-Olodaterol (STIOLTO RESPIMAT) 2.5-2.5 MCG/ACT AERS Inhale 2 puffs into the lungs daily. 2  Inhaler 0  . VICTOZA 18 MG/3ML SOPN INJECT 0.3MLS INTO SKIN DAILY 9 mL 1   No facility-administered medications prior to visit.      Allergies:   Contrast media [iodinated diagnostic agents]   Social History   Social History  . Marital status: Married    Spouse name: N/A  . Number of children: 5  . Years of education: N/A   Occupational History  . [employer]    Social History Main Topics  . Smoking status: Former Smoker    Packs/day: 1.00    Years: 49.00    Types: Cigarettes    Quit date: 09/08/2015  . Smokeless tobacco: Never Used     Comment: passive smoker as well  . Alcohol  use No  . Drug use: No  . Sexual activity: Not Asked   Other Topics Concern  . None   Social History Narrative  . None     Family History:  The patient's family history includes Diabetes in his brother and mother; Heart attack in his brother, father, and mother; Hypertension in his father.   Review of Systems:   Please see the history of present illness.     All other systems reviewed and are otherwise negative except as noted above.   Physical Exam:    VS:  BP (!) 146/110   Pulse 84   Ht 5\' 8"  (1.727 m)   Wt 196 lb (88.9 kg)   BMI 29.80 kg/m    General: Well developed, well nourished Caucasian male appearing in no acute distress. Head: Normocephalic, atraumatic, sclera non-icteric, no xanthomas. Neck: No carotid bruits. JVD not elevated.  Lungs: Respirations regular and unlabored, without wheezes or rales.  Heart: Irregularly irregular. No S3 or S4.  No murmur, no rubs, or gallops appreciated. Abdomen: Soft, non-tender, non-distended with normoactive bowel sounds. No hepatomegaly. No rebound/guarding. No obvious abdominal masses. Msk:  Strength and tone appear normal for age. No joint deformities or effusions. Extremities: No clubbing or cyanosis. No edema.  Distal pedal pulses are 2+ bilaterally. Neuro: Alert and oriented X 3. Moves all extremities spontaneously. No focal  deficits noted. Psych:  Responds to questions appropriately with a normal affect. Skin: No rashes or lesions noted  Wt Readings from Last 3 Encounters:  08/07/16 196 lb (88.9 kg)  07/31/16 198 lb (89.8 kg)  07/29/16 198 lb 9.6 oz (90.1 kg)     Studies/Labs Reviewed:   EKG:  EKG is not ordered today.    Recent Labs: 02/05/2016: Pro B Natriuretic peptide (BNP) 289.0 04/22/2016: B Natriuretic Peptide 232.6; TSH 0.424 07/29/2016: ALT 16 07/31/2016: Magnesium 1.9 08/01/2016: BUN 17; Creatinine, Ser 1.40; Hemoglobin 13.1; Platelets 145; Potassium 3.6; Sodium 139 A1c 9.5%.   Lipid Panel    Component Value Date/Time   CHOL 137 09/25/2015 0221   TRIG 64 09/25/2015 0221   HDL 47 09/25/2015 0221   CHOLHDL 2.9 09/25/2015 0221   VLDL 13 09/25/2015 0221   LDLCALC 77 09/25/2015 0221    Additional studies/ records that were reviewed today include:   Cardiac Catheterization: 09/2015  Prox LAD to Mid LAD lesion, 30 %stenosed.  1st Diag lesion, 90 %stenosed.  Ost 1st Mrg to 1st Mrg lesion, 80 %stenosed.  Ost RCA to Prox RCA lesion, 30 %stenosed.  Mid RCA-2 lesion, 40 %stenosed.  Mid RCA-1 lesion, 15 %stenosed.  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  There is mild left ventricular systolic dysfunction.  The left ventricular ejection fraction is 50-55% by visual estimate.  LV end diastolic pressure is moderately elevated.  A drug eluting stent was successfully placed.  2nd Mrg lesion, 90 %stenosed.  Post intervention, there is a 0% residual stenosis.   1. 3 vessel obstructive CAD.     - chronic severe stenosis in the first diagonal    - New 90% stenosis in a large second OM    - Stents in the proximal and mid RCA are patent    - Aneurysm and stenosis in the PLOM branch of the RCA- the aneurysm is new compared to 2009 2. Mild LV dysfunction 3. Elevated LVEDP 4. Allergic response to contrast with hives and bronchospasm. 5. Successful stenting  of the second OM with a  Biofreedom stent.   Plan: continue IV diuresis. Patient received steroids, Benadryl, and IV pepcid for contrast reaction. Continue DAPT for one month then DC ASA. Resume Eliquis in am.   Echocardiogram: 09/2015 Study Conclusions  - Left ventricle: The cavity size was normal. Systolic function was   normal. The estimated ejection fraction was in the range of 55%   to 60%. Wall motion was normal; there were no regional wall   motion abnormalities. Doppler parameters are consistent with   elevated mean left atrial filling pressure. - Left atrium: The atrium was mildly dilated.  Assessment:    1. Chronic atrial fibrillation ([place]): CHA2DS2Vasc = 5. On Warfarin. Rate control   2. CAD S/P percutaneous coronary angioplasty   3. Essential hypertension   4. Chronic diastolic congestive heart failure ([place])   5. Hyperlipidemia LDL goal <70      Plan:   In order of problems listed above:  1. Chronic Diastolic CHF - weight is stable and he has no edema.  - recommended he take Lasix 40mg  daily with extra lasix as needed for swelling. Sodium and fluid restriction reviewed with the patient.   2. Chronic Atrial Fibrillation/ Chronic Anticoagulation - HR remains stable. Continue Cardizem CD 360mg  daily and Lopressor 100mg  BID for rate control.  - This patients CHA2DS2-VASc Score and unadjusted Ischemic Stroke Rate (% per year) is equal to 7.2 % stroke rate/year from a score of 5 (HTN, DM, Vascular, Age (2)). He denies any evidence of active bleeding. Continue with Eliquis for anticoagulation.   3. CAD - s/p BMS to RCA in 2007, ISR with placement of RCA DES in 2009, Biofreedom DES to OM2 in 09/2015. - he denies any recent anginal symptoms.  - continue Plavix, statin, and BB therapy. No ASA secondary to need for Eliquis.   4. HTN - BP poorly controlled.  - will resume hydralazine 25 mg tid  - continue Cardizem and Lopressor.   5. HLD - Lipid Panel in 09/2015 showed  total cholesterol 137, HDL 47, and LDL 77. - continue Atorvastatin 20mg  daily.   Medication Adjustments/Labs and Tests Ordered: Current medicines are reviewed at length with the patient today.  Concerns regarding medicines are outlined above.  Medication changes, Labs and Tests ordered today are listed in the Patient Instructions below. Patient Instructions  Medication Instructions: Start Hydralazine 25 mg tid.  Follow-Up: Your physician recommends that you schedule a follow-up appointment in: 6 months with Dr. Martinique.    Signed, Shahir Karen Martinique, MD  08/07/2016 10:03 AM    [facility] [address], [place], [place]  [address] Phone: [phone]; Fax: [phone]  [address] [place], [place] [address] Phone: [phone]

## 2016-08-07 ENCOUNTER — Ambulatory Visit (INDEPENDENT_AMBULATORY_CARE_PROVIDER_SITE_OTHER): Payer: Medicare Other | Admitting: Cardiology

## 2016-08-07 ENCOUNTER — Encounter: Payer: Self-pay | Admitting: Cardiology

## 2016-08-07 VITALS — BP 146/110 | HR 84 | Ht 68.0 in | Wt 196.0 lb

## 2016-08-07 DIAGNOSIS — I5032 Chronic diastolic (congestive) heart failure: Secondary | ICD-10-CM | POA: Diagnosis not present

## 2016-08-07 DIAGNOSIS — I2510 Atherosclerotic heart disease of native coronary artery without angina pectoris: Secondary | ICD-10-CM | POA: Diagnosis not present

## 2016-08-07 DIAGNOSIS — E785 Hyperlipidemia, unspecified: Secondary | ICD-10-CM | POA: Diagnosis not present

## 2016-08-07 DIAGNOSIS — Z9861 Coronary angioplasty status: Secondary | ICD-10-CM | POA: Diagnosis not present

## 2016-08-07 DIAGNOSIS — I482 Chronic atrial fibrillation, unspecified: Secondary | ICD-10-CM

## 2016-08-07 DIAGNOSIS — I10 Essential (primary) hypertension: Secondary | ICD-10-CM | POA: Diagnosis not present

## 2016-08-07 MED ORDER — HYDRALAZINE HCL 25 MG PO TABS: 25.0000 mg | ORAL_TABLET | Freq: Three times a day (TID) | ORAL | 3 refills | Status: DC

## 2016-08-07 NOTE — Patient Instructions (Signed)
Resume hydralazine 25 mg three times a day.  Continue  Your other therapy  I will see you in 6 months.

## 2016-08-19 ENCOUNTER — Telehealth: Payer: Self-pay

## 2016-08-19 MED ORDER — FUROSEMIDE 40 MG PO TABS: 40.0000 mg | ORAL_TABLET | Freq: Every day | ORAL | 5 refills | Status: DC | PRN

## 2016-08-19 NOTE — Telephone Encounter (Signed)
[facility] faxed refill request for Furosemide. Is this filled by our office or does this need to be managed by cardiology?  Dr. Raliegh Ip - Please advise. Thanks!

## 2016-08-19 NOTE — Telephone Encounter (Signed)
RX sent to pharmacy  

## 2016-08-19 NOTE — Telephone Encounter (Signed)
This office can refill that medication

## 2016-08-24 ENCOUNTER — Encounter: Payer: Self-pay | Admitting: Surgery

## 2016-08-28 ENCOUNTER — Encounter: Payer: Self-pay | Admitting: Internal Medicine

## 2016-08-28 ENCOUNTER — Other Ambulatory Visit: Payer: Self-pay

## 2016-08-28 ENCOUNTER — Encounter: Payer: Self-pay | Admitting: *Deleted

## 2016-08-28 ENCOUNTER — Ambulatory Visit (INDEPENDENT_AMBULATORY_CARE_PROVIDER_SITE_OTHER): Payer: Medicare Other | Admitting: Internal Medicine

## 2016-08-28 VITALS — BP 142/82 | HR 75 | Temp 97.2°F | Ht 68.0 in | Wt 194.6 lb

## 2016-08-28 DIAGNOSIS — K5520 Angiodysplasia of colon without hemorrhage: Secondary | ICD-10-CM | POA: Diagnosis not present

## 2016-08-28 DIAGNOSIS — E118 Type 2 diabetes mellitus with unspecified complications: Secondary | ICD-10-CM | POA: Diagnosis not present

## 2016-08-28 DIAGNOSIS — N183 Chronic kidney disease, stage 3 unspecified: Secondary | ICD-10-CM

## 2016-08-28 DIAGNOSIS — I10 Essential (primary) hypertension: Secondary | ICD-10-CM

## 2016-08-28 DIAGNOSIS — E785 Hyperlipidemia, unspecified: Secondary | ICD-10-CM | POA: Diagnosis not present

## 2016-08-28 DIAGNOSIS — I482 Chronic atrial fibrillation, unspecified: Secondary | ICD-10-CM

## 2016-08-28 DIAGNOSIS — Z006 Encounter for examination for normal comparison and control in clinical research program: Secondary | ICD-10-CM

## 2016-08-28 LAB — POCT GLYCOSYLATED HEMOGLOBIN (HGB A1C): HEMOGLOBIN A1C: 8.8

## 2016-08-28 NOTE — Progress Notes (Signed)
LEADERS [employer] II Research study month 12 visit completed. Patient denies Chest pain and doing well from recent surgery. Next research required visit are phone calls at year 2 and 3 post stenting. Updated patient about research study findings and thanked him for his participation. EKG obtained:

## 2016-08-28 NOTE — Patient Instructions (Addendum)
Limit your sodium (Salt) intake   Please check your hemoglobin A1c every 3 months  Please check your blood pressure on a regular basis.  If it is consistently greater than 150/90, please make an office appointment.    It is important that you exercise regularly, at least 20 minutes 3 to 4 times per week.  If you develop chest pain or shortness of breath seek  medical attention.  You need to lose weight.  Consider a lower calorie diet and regular exercise. 

## 2016-08-28 NOTE — Progress Notes (Signed)
Subjective:    Patient ID: CHANC KERVIN, male    DOB: 1940/08/24, 76 y.o.   MRN: [id number]  HPI  76 year old patient who is seen today for follow-up. Last month he was hospitalized and underwent endovascular repair of AAA.  He did quite well. He has been seen in follow-up by cardiology.  His hemoglobin A1c during the hospital course was quite elevated.  His prior hemoglobin A1c 4 months ago was 6.3. He states fasting blood sugars are often in the 200 range.  He has been compliant with his medications which include glimepiride, metformin and Victoza. He has chronic kidney disease and GFR is have been in the 45-50 range  Past Medical History:  Diagnosis Date  . 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 BENIGN PROSTATIC HYPERTROPHY, HX OF 02/24/2007  . CAD S/P PCI    a. 2007 Inf STEMI s/p Vision BMS  2 to RCA;  b. 2009 ISR Prox RCA Stent-->with DES;  c. 09/2015 PCI: LM nl, LAD 30p/m, D1 90, LCX nl, OM1 small, 80, OM2 90 (2.5x14 Biofreedom stent), OM3 small, RCA 30p ISR, 56m ISR, 18m, 85d, EF 50-55%.  . 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 Chronic atrial fibrillation ([place]) 11/06/2009   a. On Eliquis (CHA2DS2VASc = 5).  Rate controlled w/ BB and CCB.  Marland Kitchen Chronic diastolic CHF (congestive heart failure) ([place])    a. 09/2015 Echo: EF 55-60%, mildly dil LA.  . CKD (chronic kidney disease), stage III    they mentioned this to the patient but they would work on it later after the aneurysm  . COLONIC POLYPS, HX OF 11/02/2006  . COPD (chronic obstructive pulmonary disease) ([place])   . DIABETES MELLITUS, TYPE II 10/29/2006  . Diverticulosis   . History of tobacco abuse    a. Quit 09/2015.  Marland Kitchen HYPERLIPIDEMIA 10/29/2006  . Hypertensive heart disease with heart failure ([place])   . Pneumonia 09/2015   [name] 07/31/2016  . STEMI (ST elevation myocardial infarction) [facility]) 2007   [name] 07/31/2016     Social History   Social History  . Marital status: Married    Spouse name: N/A  . Number of  children: 5  . Years of education: N/A   Occupational History  . [employer]    Social History Main Topics  . Smoking status: Former Smoker    Packs/day: 1.00    Years: 49.00    Types: Cigarettes    Quit date: 09/08/2015  . Smokeless tobacco: Never Used     Comment: passive smoker as well  . Alcohol use No  . Drug use: No  . Sexual activity: Not on file   Other Topics Concern  . Not on file   Social History Narrative  . No narrative on file    Past Surgical History:  Procedure Laterality Date  . ABDOMINAL AORTIC ENDOVASCULAR STENT GRAFT N/A 07/31/2016   Procedure: ABDOMINAL AORTIC ENDOVASCULAR STENT GRAFT;  Surgeon: Serafina Mitchell, MD;  Location: [facility];  Service: Vascular;  Laterality: N/A;  . CARDIAC CATHETERIZATION N/A 09/26/2015   Procedure: Left Heart Cath and Coronary Angiography;  Surgeon: Dontrail Blackwell M Martinique, MD;  Location: [facility];  Service: Cardiovascular;  Laterality: N/A;  . CARDIAC CATHETERIZATION N/A 09/26/2015   Procedure: Coronary Stent Intervention;  Surgeon: Jaquay Posthumus M Martinique, MD;  Location: [facility];  Service: Cardiovascular;  Laterality: N/A;  . CATARACT EXTRACTION Bilateral   . COLONOSCOPY    . CORONARY ANGIOPLASTY WITH STENT PLACEMENT  2007   Inferior STEMI 2007 - RCA PCI with 2 Vision BMS; Abnormal Myoview in 2009 - pRCA ins-stent restenosis & unstented disease - DES PCI Promus 3.0 x 20 mm (3.5 mm)  . ENDOVASCULAR REPAIR/STENT GRAFT  07/31/2016  . INCISION AND DRAINAGE PERIRECTAL ABSCESS N/A 11/06/2015   Procedure: IRRIGATION AND DEBRIDEMENT PERIRECTAL ABSCESS, POSSIBLE HEMORRHOIDECTOMY;  Surgeon: Coralie Keens, MD;  Location: [facility];  Service: General;  Laterality: N/A;  . ORIF SHOULDER FRACTURE Right    "fell out of back end of a truck"    Family History  Problem Relation Age of Onset  . Heart attack Mother   . Diabetes Mother   . Heart attack Father   . Hypertension Father   . Heart attack Brother   . Diabetes Brother      Allergies  Allergen Reactions  . Contrast Media [Iodinated Diagnostic Agents] Anaphylaxis    Current Outpatient Prescriptions on File Prior to Visit  Medication Sig Dispense Refill  . atorvastatin (LIPITOR) 20 MG tablet TAKE 1 TABLET BY MOUTH EVERY DAY 90 tablet 1  . clopidogrel (PLAVIX) 75 MG tablet TAKE 1 TABLET BY MOUTH EVERY DAY 90 tablet 3  . diltiazem (CARDIZEM CD) 360 MG 24 hr capsule Take 1 capsule (360 mg total) by mouth daily. 90 capsule 4  . diphenhydrAMINE (BENADRYL) 25 mg capsule Take 2 capsules (50 mg) by mouth 1 hour prior to your procedure. 2 capsule 1  . ELIQUIS 5 MG TABS tablet TAKE 1 TABLET (5 MG TOTAL) BY MOUTH 2 (TWO) TIMES DAILY. 60 tablet 5  . furosemide (LASIX) 40 MG tablet Take 1 tablet (40 mg total) by mouth daily as needed. 30 tablet 5  . glimepiride (AMARYL) 4 MG tablet TAKE 1 TABLET BY MOUTH EVERY MORNING BEFORE BREAKFAST (Patient taking differently: TAKE 1/2 TABLET (2mg ) BY MOUTH EVERY MORNING BEFORE BREAKFAST) 90 tablet 1  . hydrALAZINE (APRESOLINE) 25 MG tablet Take 1 tablet (25 mg total) by mouth 3 (three) times daily. 270 tablet 3  . levalbuterol (XOPENEX HFA) 45 MCG/ACT inhaler Inhale 1-2 puffs into the lungs every 6 (six) hours as needed for wheezing. 1 Inhaler 6  . metFORMIN (GLUCOPHAGE) 1000 MG tablet TAKE 1 TABLET TWICE DAILY WITH A MEAL 180 tablet 2  . metoprolol (LOPRESSOR) 100 MG tablet Take 1 tablet (100 mg total) by mouth 2 (two) times daily. 180 tablet 1  . nitroGLYCERIN (NITROSTAT) 0.4 MG SL tablet Place 1 tablet (0.4 mg total) under the tongue every 5 (five) minutes as needed for chest pain. X 3 doses 25 tablet 4  . oxyCODONE (OXY IR/ROXICODONE) 5 MG immediate release tablet Take 1 tablet (5 mg total) by mouth every 6 (six) hours as needed for moderate pain or severe pain. 10 tablet 0  . polyethylene glycol (MIRALAX / GLYCOLAX) packet Take 17 g by mouth daily as needed for mild constipation.    . predniSONE (DELTASONE) 50 MG tablet One tablet  (50mg ) 13 hours prior to procedure; one tablet (50mg ) 7 hours prior to procedure and then one tablet (50 mg) one hour prior to procedure. 3 tablet 0  . Tiotropium Bromide-Olodaterol (STIOLTO RESPIMAT) 2.5-2.5 MCG/ACT AERS Inhale 2 puffs into the lungs daily. 2 Inhaler 0  . VICTOZA 18 MG/3ML SOPN INJECT 0.3MLS INTO SKIN DAILY 9 mL 1   No current facility-administered medications on file prior to visit.     BP (!) 142/82 (BP Location: Left Arm, Patient Position: Sitting, Cuff Size: Normal)   Pulse 75  Temp (!) 97.2 F (36.2 C) (Oral)   Ht 5\' 8"  (1.727 m)   Wt 194 lb 9.6 oz (88.3 kg)   SpO2 95%   BMI 29.59 kg/m     Review of Systems  Constitutional: Negative for appetite change, chills, fatigue and fever.  HENT: Negative for congestion, dental problem, ear pain, hearing loss, sore throat, tinnitus, trouble swallowing and voice change.   Eyes: Negative for pain, discharge and visual disturbance.  Respiratory: Negative for cough, chest tightness, wheezing and stridor.   Cardiovascular: Negative for chest pain, palpitations and leg swelling.  Gastrointestinal: Negative for abdominal distention, abdominal pain, blood in stool, constipation, diarrhea, nausea and vomiting.  Genitourinary: Negative for difficulty urinating, discharge, flank pain, genital sores, hematuria and urgency.  Musculoskeletal: Negative for arthralgias, back pain, gait problem, joint swelling, myalgias and neck stiffness.  Skin: Negative for rash.  Neurological: Negative for dizziness, syncope, speech difficulty, weakness, numbness and headaches.  Hematological: Negative for adenopathy. Does not bruise/bleed easily.  Psychiatric/Behavioral: Negative for behavioral problems and dysphoric mood. The patient is not nervous/anxious.        Objective:   Physical Exam  Constitutional: He is oriented to person, place, and time. He appears well-developed.  Blood pressure 130/80  HENT:  Head: Normocephalic.  Right Ear:  External ear normal.  Left Ear: External ear normal.  Eyes: Conjunctivae and EOM are normal.  Neck: Normal range of motion.  Cardiovascular: Normal rate and normal heart sounds.   Controlled ventricular response  Pulmonary/Chest: Breath sounds normal.  Abdominal: Bowel sounds are normal.  Musculoskeletal: Normal range of motion. He exhibits no edema or tenderness.  Neurological: He is alert and oriented to person, place, and time.  Psychiatric: He has a normal mood and affect. His behavior is normal.          Assessment & Plan:   Diabetes mellitus.  Hemoglobin A1c 8.8.  We'll continue metformin at full dosing but if GFR drops below 45 consistently.  Will need to down titrate Discontinue glimepiride and substitute glipizide.  Continue Victoza Lifestyle issues discussed and encouraged.  Recheck 3 months.  May need to institute insulin therapy at that time Hypertension, stable Status post endovascular AAA repair Chronic atrial fibrillation  Jason Fisher

## 2016-08-31 ENCOUNTER — Other Ambulatory Visit: Payer: Self-pay | Admitting: Internal Medicine

## 2016-08-31 MED ORDER — FUROSEMIDE 40 MG PO TABS: 40.0000 mg | ORAL_TABLET | Freq: Every day | ORAL | 0 refills | Status: DC | PRN

## 2016-09-02 ENCOUNTER — Ambulatory Visit (HOSPITAL_COMMUNITY)
Admission: RE | Admit: 2016-09-02 | Discharge: 2016-09-02 | Disposition: A | Discharge status: Home / Self Care | Payer: Medicare Other | Source: Ambulatory Visit | Attending: Surgery | Admitting: Surgery

## 2016-09-02 DIAGNOSIS — I700 Atherosclerosis of aorta: Secondary | ICD-10-CM | POA: Diagnosis not present

## 2016-09-02 DIAGNOSIS — Z95828 Presence of other vascular implants and grafts: Secondary | ICD-10-CM | POA: Diagnosis not present

## 2016-09-02 DIAGNOSIS — I714 Abdominal aortic aneurysm, without rupture, unspecified: Secondary | ICD-10-CM

## 2016-09-02 DIAGNOSIS — I719 Aortic aneurysm of unspecified site, without rupture: Secondary | ICD-10-CM | POA: Insufficient documentation

## 2016-09-02 DIAGNOSIS — J439 Emphysema, unspecified: Secondary | ICD-10-CM | POA: Diagnosis not present

## 2016-09-02 MED ORDER — IOPAMIDOL (ISOVUE-370) INJECTION 76%
INTRAVENOUS | Status: AC
  Administered 2016-09-02: 100 mL
  Filled 2016-09-02: qty 100

## 2016-09-07 ENCOUNTER — Encounter: Payer: Self-pay | Admitting: Surgery

## 2016-09-07 ENCOUNTER — Ambulatory Visit (INDEPENDENT_AMBULATORY_CARE_PROVIDER_SITE_OTHER): Payer: Self-pay | Admitting: Surgery

## 2016-09-07 VITALS — BP 127/70 | HR 76 | Temp 96.3°F | Resp 20 | Ht 68.0 in | Wt 198.0 lb

## 2016-09-07 DIAGNOSIS — I714 Abdominal aortic aneurysm, without rupture, unspecified: Secondary | ICD-10-CM

## 2016-09-07 NOTE — Progress Notes (Signed)
Patient name: Jason Fisher MRN: [id number] DOB: 20-Jul-1940 Sex: male  REASON FOR VISIT:     post op  HISTORY OF PRESENT ILLNESS:   Jason Fisher is a 76 y.o. male returns today for his first postoperative visit.  He is status post endovascular repair of a 5 cm infrarenal abdominal aortic aneurysm on 07/31/2016.  His postoperative course was not compensated.  His only complaint today is that of some fatigue.  CURRENT MEDICATIONS:    Current Outpatient Prescriptions  Medication Sig Dispense Refill  . atorvastatin (LIPITOR) 20 MG tablet TAKE 1 TABLET BY MOUTH EVERY DAY 90 tablet 1  . clopidogrel (PLAVIX) 75 MG tablet TAKE 1 TABLET BY MOUTH EVERY DAY 90 tablet 3  . diltiazem (CARDIZEM CD) 360 MG 24 hr capsule Take 1 capsule (360 mg total) by mouth daily. 90 capsule 4  . diphenhydrAMINE (BENADRYL) 25 mg capsule Take 2 capsules (50 mg) by mouth 1 hour prior to your procedure. 2 capsule 1  . ELIQUIS 5 MG TABS tablet TAKE 1 TABLET (5 MG TOTAL) BY MOUTH 2 (TWO) TIMES DAILY. 60 tablet 5  . furosemide (LASIX) 40 MG tablet Take 1 tablet (40 mg total) by mouth daily as needed. 90 tablet 0  . glimepiride (AMARYL) 4 MG tablet TAKE 1 TABLET BY MOUTH EVERY MORNING BEFORE BREAKFAST (Patient taking differently: TAKE 1/2 TABLET (2mg ) BY MOUTH EVERY MORNING BEFORE BREAKFAST) 90 tablet 1  . hydrALAZINE (APRESOLINE) 25 MG tablet Take 1 tablet (25 mg total) by mouth 3 (three) times daily. 270 tablet 3  . levalbuterol (XOPENEX HFA) 45 MCG/ACT inhaler Inhale 1-2 puffs into the lungs every 6 (six) hours as needed for wheezing. 1 Inhaler 6  . metFORMIN (GLUCOPHAGE) 1000 MG tablet TAKE 1 TABLET TWICE DAILY WITH A MEAL 180 tablet 2  . metoprolol (LOPRESSOR) 100 MG tablet Take 1 tablet (100 mg total) by mouth 2 (two) times daily. 180 tablet 1  . nitroGLYCERIN (NITROSTAT) 0.4 MG SL tablet Place 1 tablet (0.4 mg total) under the tongue every 5 (five) minutes as needed for chest pain.  X 3 doses 25 tablet 4  . oxyCODONE (OXY IR/ROXICODONE) 5 MG immediate release tablet Take 1 tablet (5 mg total) by mouth every 6 (six) hours as needed for moderate pain or severe pain. 10 tablet 0  . polyethylene glycol (MIRALAX / GLYCOLAX) packet Take 17 g by mouth daily as needed for mild constipation.    . predniSONE (DELTASONE) 50 MG tablet One tablet (50mg ) 13 hours prior to procedure; one tablet (50mg ) 7 hours prior to procedure and then one tablet (50 mg) one hour prior to procedure. 3 tablet 0  . Tiotropium Bromide-Olodaterol (STIOLTO RESPIMAT) 2.5-2.5 MCG/ACT AERS Inhale 2 puffs into the lungs daily. 2 Inhaler 0  . VICTOZA 18 MG/3ML SOPN INJECT 0.3MLS INTO SKIN DAILY 9 mL 1   No current facility-administered medications for this visit.     REVIEW OF SYSTEMS:   [X]  denotes positive finding, [ ]  denotes negative finding Cardiac  Comments:  Chest pain or chest pressure:    Shortness of breath upon exertion:    Short of breath when lying flat:    Irregular heart rhythm:    Constitutional    Fever or chills:      PHYSICAL EXAM:   Vitals:   09/07/16 1034  BP: 127/70  Pulse: 76  Resp: 20  Temp: (!) 96.3 F (35.7 C)  TempSrc: Oral  SpO2: 93%  Weight: 198 lb (89.8  kg)  Height: 5\' 8"  (1.727 m)    GENERAL: The patient is a well-nourished male, in no acute distress. The vital signs are documented above. CARDIOVASCULAR: There is a regular rate and rhythm. PULMONARY: Non-labored respirations Both groin incisions have healed  STUDIES:   1. Interval changes of endovascular aortic repair of the infrarenal abdominal aortic aneurysm. There is evidence of an endoleak with contrast opacification in the inferior aspect of the excluded aneurysm sac surrounding the left iliac limb and tracking superiorly to the inferior mesenteric artery. The inferior mesenteric artery likely represents the outflow. 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 The excluded aneurysm sac remains stable in size. 2. No evidence of complication at either femoral artery access site. 3. Variant hepatic artery anatomy as above.   MEDICAL ISSUES:   Status post endovascular aneurysm repair: I discussed the finding of a type II endoleak on his CT scan today.  No intervention is recommended.  The patient will follow up in 6 months with a repeat CT scan.  Annamarie Major, MD [facility] [phone] Pager [phone]

## 2016-09-09 NOTE — Addendum Note (Signed)
Addended by: Lianne Cure A on: 09/09/2016 02:13 PM   Modules accepted: Orders

## 2016-09-10 ENCOUNTER — Ambulatory Visit (INDEPENDENT_AMBULATORY_CARE_PROVIDER_SITE_OTHER): Payer: Medicare Other | Admitting: Ophthalmology

## 2016-09-10 DIAGNOSIS — E113393 Type 2 diabetes mellitus with moderate nonproliferative diabetic retinopathy without macular edema, bilateral: Secondary | ICD-10-CM | POA: Diagnosis not present

## 2016-09-10 DIAGNOSIS — H35033 Hypertensive retinopathy, bilateral: Secondary | ICD-10-CM | POA: Diagnosis not present

## 2016-09-10 DIAGNOSIS — H43813 Vitreous degeneration, bilateral: Secondary | ICD-10-CM | POA: Diagnosis not present

## 2016-09-10 DIAGNOSIS — H348332 Tributary (branch) retinal vein occlusion, bilateral, stable: Secondary | ICD-10-CM | POA: Diagnosis not present

## 2016-09-10 DIAGNOSIS — I10 Essential (primary) hypertension: Secondary | ICD-10-CM | POA: Diagnosis not present

## 2016-09-10 DIAGNOSIS — E11319 Type 2 diabetes mellitus with unspecified diabetic retinopathy without macular edema: Secondary | ICD-10-CM | POA: Diagnosis not present

## 2016-09-15 ENCOUNTER — Other Ambulatory Visit: Payer: Self-pay | Admitting: Internal Medicine

## 2016-10-10 ENCOUNTER — Other Ambulatory Visit: Payer: Self-pay | Admitting: Internal Medicine

## 2016-10-12 ENCOUNTER — Other Ambulatory Visit: Payer: Self-pay | Admitting: Internal Medicine

## 2016-10-13 ENCOUNTER — Other Ambulatory Visit: Payer: Self-pay | Admitting: Internal Medicine

## 2016-10-29 ENCOUNTER — Other Ambulatory Visit: Payer: Self-pay

## 2016-10-29 NOTE — Patient Outreach (Signed)
[facility]) Care Management  10/29/2016  Jason Fisher 14-Feb-1940 [id number]    Medication Adherence call to Jason Fisher the reason for this call is because Jason Fisher is showing past due under [facility] Ins.on his Metformin 1000 mg and  Glimepiride 4 mg spoke to patient he said he still has some medication and does not need any at this time he will order it himself.   [place] Management Direct Dial [phone]  Fax [phone] [email or web address]

## 2016-11-06 ENCOUNTER — Other Ambulatory Visit: Payer: Self-pay | Admitting: Internal Medicine

## 2016-11-06 MED ORDER — FUROSEMIDE 40 MG PO TABS: 40.0000 mg | ORAL_TABLET | Freq: Every day | ORAL | 2 refills | Status: DC | PRN

## 2016-11-09 ENCOUNTER — Other Ambulatory Visit: Payer: Self-pay

## 2016-11-09 NOTE — Patient Outreach (Signed)
[facility] [facility]) Care Management  11/09/2016  AIMEE TIMMONS January 25, 1941 [id number]   Medication Adherence call to JasonJason Fisher the reason for this call is because Jason Fisher is showing past due under [facility] Ins.on two of his medication Metformin 1000 and glimepiride 4 mg spoke to patients wife she said she is pick up both of this medication from [place] today he still has some medication but was going to pick them up today.   [name] Management Direct Dial [phone]  Fax [phone] [email or web address]

## 2016-11-26 ENCOUNTER — Ambulatory Visit: Payer: Medicare Other | Admitting: Internal Medicine

## 2016-11-30 ENCOUNTER — Other Ambulatory Visit: Payer: Self-pay | Admitting: Internal Medicine

## 2016-12-01 ENCOUNTER — Encounter: Payer: Self-pay | Admitting: Internal Medicine

## 2016-12-01 ENCOUNTER — Ambulatory Visit (INDEPENDENT_AMBULATORY_CARE_PROVIDER_SITE_OTHER): Payer: Medicare Other | Admitting: Internal Medicine

## 2016-12-01 VITALS — BP 132/70 | HR 87 | Temp 97.6°F | Ht 68.0 in | Wt 196.6 lb

## 2016-12-01 DIAGNOSIS — E1151 Type 2 diabetes mellitus with diabetic peripheral angiopathy without gangrene: Secondary | ICD-10-CM

## 2016-12-01 DIAGNOSIS — I5032 Chronic diastolic (congestive) heart failure: Secondary | ICD-10-CM

## 2016-12-01 DIAGNOSIS — I10 Essential (primary) hypertension: Secondary | ICD-10-CM

## 2016-12-01 DIAGNOSIS — I482 Chronic atrial fibrillation, unspecified: Secondary | ICD-10-CM

## 2016-12-01 LAB — BASIC METABOLIC PANEL
BUN: 15 mg/dL (ref 6–23)
CALCIUM: 10.2 mg/dL (ref 8.4–10.5)
CHLORIDE: 103 meq/L (ref 96–112)
CO2: 30 meq/L (ref 19–32)
CREATININE: 1.47 mg/dL (ref 0.40–1.50)
GFR: 49.49 mL/min — ABNORMAL LOW (ref >60.00)
GLUCOSE: 296 mg/dL — AB (ref 70–99)
Potassium: 3.8 mEq/L (ref 3.5–5.1)
Sodium: 141 mEq/L (ref 135–145)

## 2016-12-01 LAB — HEMOGLOBIN A1C: HEMOGLOBIN A1C: 8.5 % — AB (ref 4.6–6.5)

## 2016-12-01 MED ORDER — LIRAGLUTIDE 18 MG/3ML ~~LOC~~ SOPN: 1.2000 mg | PEN_INJECTOR | Freq: Every day | SUBCUTANEOUS | 1 refills | Status: DC

## 2016-12-01 MED ORDER — GLIPIZIDE ER 5 MG PO TB24: 5.0000 mg | ORAL_TABLET | Freq: Every day | ORAL | 3 refills | Status: DC

## 2016-12-01 NOTE — Progress Notes (Signed)
Subjective:    Patient ID: Jason Fisher, male    DOB: November 21, 1940, 76 y.o.   MRN: [id number]  HPI  Lab Results  Component Value Date   HGBA1C 8.8 08/28/2016   76 year old patient who is seen today for follow-up of diabetes. His last hemoglobin A1c was poorly controlled following a hospital admission for surgery.  He also had been treated with short-term high-dose steroids.  His hemoglobin A1c prior had been very well controlled. Nonpharmacologic measures discussed last visit.  Unfortunately, there has been no weight loss Medications discussed.  He is on Victoza, but at a low dose of 0.3 milligrams daily  Past Medical History:  Diagnosis Date  . 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 BENIGN PROSTATIC HYPERTROPHY, HX OF 02/24/2007  . CAD S/P PCI    a. 2007 Inf STEMI s/p Vision BMS  2 to RCA;  b. 2009 ISR Prox RCA Stent-->with DES;  c. 09/2015 PCI: LM nl, LAD 30p/m, D1 90, LCX nl, OM1 small, 80, OM2 90 (2.5x14 Biofreedom stent), OM3 small, RCA 30p ISR, 98m ISR, 18m, 85d, EF 50-55%.  . 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 Chronic atrial fibrillation ([place]) 11/06/2009   a. On Eliquis (CHA2DS2VASc = 5).  Rate controlled w/ BB and CCB.  Marland Kitchen Chronic diastolic CHF (congestive heart failure) ([place] [address])    a. 09/2015 Echo: EF 55-60%, mildly dil LA.  . CKD (chronic kidney disease), stage III ([place])    they mentioned this to the patient but they would work on it later after the aneurysm  . COLONIC POLYPS, HX OF 11/02/2006  . COPD (chronic obstructive pulmonary disease) ([place])   . DIABETES MELLITUS, TYPE II 10/29/2006  . Diverticulosis   . History of tobacco abuse    a. Quit 09/2015.  Marland Kitchen HYPERLIPIDEMIA 10/29/2006  . Hypertensive heart disease with heart failure ([place])   . Pneumonia 09/2015   [name] 07/31/2016  . STEMI (ST elevation myocardial infarction) [facility]) 2007   [name] 07/31/2016     Social History   Social History  . Marital status: Married    Spouse name: N/A  . Number of children: 5  .  Years of education: N/A   Occupational History  . [employer]    Social History Main Topics  . Smoking status: Former Smoker    Packs/day: 1.00    Years: 49.00    Types: Cigarettes    Quit date: 09/08/2015  . Smokeless tobacco: Never Used     Comment: passive smoker as well  . Alcohol use No  . Drug use: No  . Sexual activity: Not on file   Other Topics Concern  . Not on file   Social History Narrative  . No narrative on file    Past Surgical History:  Procedure Laterality Date  . ABDOMINAL AORTIC ENDOVASCULAR STENT GRAFT N/A 07/31/2016   Procedure: ABDOMINAL AORTIC ENDOVASCULAR STENT GRAFT;  Surgeon: Serafina Mitchell, MD;  Location: [place];  Service: Vascular;  Laterality: N/A;  . CARDIAC CATHETERIZATION N/A 09/26/2015   Procedure: Left Heart Cath and Coronary Angiography;  Surgeon: Yuliana Vandrunen M Martinique, MD;  Location: [place] CV LAB;  Service: Cardiovascular;  Laterality: N/A;  . CARDIAC CATHETERIZATION N/A 09/26/2015   Procedure: Coronary Stent Intervention;  Surgeon: Astria Jordahl M Martinique, MD;  Location: [place] CV LAB;  Service: Cardiovascular;  Laterality: N/A;  . CATARACT EXTRACTION Bilateral   . COLONOSCOPY    . CORONARY ANGIOPLASTY WITH STENT PLACEMENT  2007   Inferior STEMI  2007 - RCA PCI with 2 Vision BMS; Abnormal Myoview in 2009 - pRCA ins-stent restenosis & unstented disease - DES PCI Promus 3.0 x 20 mm (3.5 mm)  . ENDOVASCULAR REPAIR/STENT GRAFT  07/31/2016  . INCISION AND DRAINAGE PERIRECTAL ABSCESS N/A 11/06/2015   Procedure: IRRIGATION AND DEBRIDEMENT PERIRECTAL ABSCESS, POSSIBLE HEMORRHOIDECTOMY;  Surgeon: Coralie Keens, MD;  Location: [place];  Service: General;  Laterality: N/A;  . ORIF SHOULDER FRACTURE Right    "fell out of back end of a truck"    Family History  Problem Relation Age of Onset  . Heart attack Mother   . Diabetes Mother   . Heart attack Father   . Hypertension Father   . Heart attack Brother   . Diabetes Brother     Allergies  Allergen  Reactions  . Contrast Media [Iodinated Diagnostic Agents] Anaphylaxis    Current Outpatient Prescriptions on File Prior to Visit  Medication Sig Dispense Refill  . ACCU-CHEK AVIVA PLUS test strip CHECK BLOOD SUGAR ONCE DAILY AS NEEDED 100 each 2  . atorvastatin (LIPITOR) 20 MG tablet TAKE 1 TABLET BY MOUTH EVERY DAY 90 tablet 1  . clopidogrel (PLAVIX) 75 MG tablet TAKE 1 TABLET BY MOUTH EVERY DAY 90 tablet 3  . diltiazem (CARDIZEM CD) 360 MG 24 hr capsule Take 1 capsule (360 mg total) by mouth daily. 90 capsule 4  . diphenhydrAMINE (BENADRYL) 25 mg capsule Take 2 capsules (50 mg) by mouth 1 hour prior to your procedure. 2 capsule 1  . ELIQUIS 5 MG TABS tablet TAKE 1 TABLET (5 MG TOTAL) BY MOUTH 2 (TWO) TIMES DAILY. 60 tablet 5  . furosemide (LASIX) 40 MG tablet Take 1 tablet (40 mg total) by mouth daily as needed. 90 tablet 2  . hydrALAZINE (APRESOLINE) 25 MG tablet Take 1 tablet (25 mg total) by mouth 3 (three) times daily. 270 tablet 3  . levalbuterol (XOPENEX HFA) 45 MCG/ACT inhaler Inhale 1-2 puffs into the lungs every 6 (six) hours as needed for wheezing. 1 Inhaler 6  . metFORMIN (GLUCOPHAGE) 1000 MG tablet TAKE 1 TABLET TWICE DAILY WITH A MEAL 180 tablet 2  . metoprolol (LOPRESSOR) 100 MG tablet Take 1 tablet (100 mg total) by mouth 2 (two) times daily. 180 tablet 1  . nitroGLYCERIN (NITROSTAT) 0.4 MG SL tablet Place 1 tablet (0.4 mg total) under the tongue every 5 (five) minutes as needed for chest pain. X 3 doses 25 tablet 4  . polyethylene glycol (MIRALAX / GLYCOLAX) packet Take 17 g by mouth daily as needed for mild constipation.    . Tiotropium Bromide-Olodaterol (STIOLTO RESPIMAT) 2.5-2.5 MCG/ACT AERS Inhale 2 puffs into the lungs daily. 2 Inhaler 0   No current facility-administered medications on file prior to visit.     BP 132/70 (BP Location: Left Arm, Patient Position: Sitting, Cuff Size: Normal)   Pulse 87   Temp 97.6 F (36.4 C) (Oral)   Ht 5\' 8"  (1.727 m)   Wt 196 lb  9.6 oz (89.2 kg)   SpO2 95%   BMI 29.89 kg/m     Review of Systems  Constitutional: Negative for appetite change, chills, fatigue and fever.  HENT: Negative for congestion, dental problem, ear pain, hearing loss, sore throat, tinnitus, trouble swallowing and voice change.   Eyes: Negative for pain, discharge and visual disturbance.  Respiratory: Negative for cough, chest tightness, wheezing and stridor.   Cardiovascular: Negative for chest pain, palpitations and leg swelling.  Gastrointestinal: Negative for abdominal distention, abdominal  pain, blood in stool, constipation, diarrhea, nausea and vomiting.  Genitourinary: Negative for difficulty urinating, discharge, flank pain, genital sores, hematuria and urgency.  Musculoskeletal: Negative for arthralgias, back pain, gait problem, joint swelling, myalgias and neck stiffness.  Skin: Negative for rash.  Neurological: Negative for dizziness, syncope, speech difficulty, weakness, numbness and headaches.  Hematological: Negative for adenopathy. Does not bruise/bleed easily.  Psychiatric/Behavioral: Negative for behavioral problems and dysphoric mood. The patient is not nervous/anxious.        Objective:   Physical Exam  Constitutional: He appears well-developed and well-nourished. No distress.  Blood pressure well controlled  Weight 196  Cardiovascular: Normal rate and regular rhythm.   Pulmonary/Chest: Effort normal and breath sounds normal.  Musculoskeletal: He exhibits no edema.          Assessment & Plan:   Diabetes mellitus.  Will check hemoglobin A1c.  Nonpharmacologic measures discussed and encouraged Titrate Victoza to a final dose of 1.2 milligrams  Weight loss encouraged Exercise recommended Follow-up 3 months  Jordane Hisle Pilar Plate

## 2016-12-01 NOTE — Patient Instructions (Signed)
Limit your sodium (Salt) intake   Please check your hemoglobin A1c every 3 months    It is important that you exercise regularly, at least 20 minutes 3 to 4 times per week.  If you develop chest pain or shortness of breath seek  medical attention.  You need to lose weight.  Consider a lower calorie diet and regular exercise. 

## 2017-01-14 ENCOUNTER — Ambulatory Visit (INDEPENDENT_AMBULATORY_CARE_PROVIDER_SITE_OTHER): Payer: Medicare Other | Admitting: Internal Medicine

## 2017-01-14 ENCOUNTER — Encounter: Payer: Self-pay | Admitting: Internal Medicine

## 2017-01-14 VITALS — BP 148/80 | HR 89 | Ht 68.0 in | Wt 199.6 lb

## 2017-01-14 DIAGNOSIS — M549 Dorsalgia, unspecified: Secondary | ICD-10-CM

## 2017-01-14 MED ORDER — PREDNISONE 10 MG PO TABS: 10.0000 mg | ORAL_TABLET | Freq: Two times a day (BID) | ORAL | 0 refills | Status: DC

## 2017-01-14 MED ORDER — TIZANIDINE HCL 2 MG PO TABS: 2.0000 mg | ORAL_TABLET | Freq: Four times a day (QID) | ORAL | 0 refills | Status: DC | PRN

## 2017-01-14 NOTE — Progress Notes (Signed)
Subjective:    Patient ID: Jason Fisher, male    DOB: 1940-09-28, 76 y.o.   MRN: [id number]  HPI  76 year old patient who is followed for type 2 diabetes.  He presents with a 1-1/2-2-week history of low back pain.  He describes severe intense lumbar spasm with certain movements.  He has been unable to sleep flat in his bed for the past week and a half.  Certain movements precipitate intense pain.  No radicular component.  No GI or GU symptoms.  Past Medical History:  Diagnosis Date  . 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 BENIGN PROSTATIC HYPERTROPHY, HX OF 02/24/2007  . CAD S/P PCI    a. 2007 Inf STEMI s/p Vision BMS  2 to RCA;  b. 2009 ISR Prox RCA Stent-->with DES;  c. 09/2015 PCI: LM nl, LAD 30p/m, D1 90, LCX nl, OM1 small, 80, OM2 90 (2.5x14 Biofreedom stent), OM3 small, RCA 30p ISR, 55m ISR, 69m, 85d, EF 50-55%.  . 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 Chronic atrial fibrillation ([place]) 11/06/2009   a. On Eliquis (CHA2DS2VASc = 5).  Rate controlled w/ BB and CCB.  Marland Kitchen Chronic diastolic CHF (congestive heart failure) ([place])    a. 09/2015 Echo: EF 55-60%, mildly dil LA.  . CKD (chronic kidney disease), stage III ([place])    they mentioned this to the patient but they would work on it later after the aneurysm  . COLONIC POLYPS, HX OF 11/02/2006  . COPD (chronic obstructive pulmonary disease) ([place])   . DIABETES MELLITUS, TYPE II 10/29/2006  . Diverticulosis   . History of tobacco abuse    a. Quit 09/2015.  Marland Kitchen HYPERLIPIDEMIA 10/29/2006  . Hypertensive heart disease with heart failure ([place])   . Pneumonia 09/2015   [name] 07/31/2016  . STEMI (ST elevation myocardial infarction) [facility]) 2007   [name] 07/31/2016     Social History   Socioeconomic History  . Marital status: Married    Spouse name: Not on file  . Number of children: 5  . Years of education: Not on file  . Highest education level: Not on file  Social Needs  . Financial resource strain: Not on file  . Food insecurity -  worry: Not on file  . Food insecurity - inability: Not on file  . Transportation needs - medical: Not on file  . Transportation needs - non-medical: Not on file  Occupational History  . Occupation: [employer]  Tobacco Use  . Smoking status: Former Smoker    Packs/day: 1.00    Years: 49.00    Pack years: 49.00    Types: Cigarettes    Last attempt to quit: 09/08/2015    Years since quitting: 1.3  . Smokeless tobacco: Never Used  . Tobacco comment: passive smoker as well  Substance and Sexual Activity  . Alcohol use: No    Alcohol/week: 0.0 oz  . Drug use: No  . Sexual activity: Not on file  Other Topics Concern  . Not on file  Social History Narrative  . Not on file    Past Surgical History:  Procedure Laterality Date  . ABDOMINAL AORTIC ENDOVASCULAR STENT GRAFT N/A 07/31/2016   Procedure: ABDOMINAL AORTIC ENDOVASCULAR STENT GRAFT;  Surgeon: Serafina Mitchell, MD;  Location: [place];  Service: Vascular;  Laterality: N/A;  . CARDIAC CATHETERIZATION N/A 09/26/2015   Procedure: Left Heart Cath and Coronary Angiography;  Surgeon: Peter M Martinique, MD;  Location: [place] CV LAB;  Service: Cardiovascular;  Laterality: N/A;  . CARDIAC CATHETERIZATION N/A 09/26/2015   Procedure: Coronary Stent Intervention;  Surgeon: Peter M Martinique, MD;  Location: [place] CV LAB;  Service: Cardiovascular;  Laterality: N/A;  . CATARACT EXTRACTION Bilateral   . COLONOSCOPY    . CORONARY ANGIOPLASTY WITH STENT PLACEMENT  2007   Inferior STEMI 2007 - RCA PCI with 2 Vision BMS; Abnormal Myoview in 2009 - pRCA ins-stent restenosis & unstented disease - DES PCI Promus 3.0 x 20 mm (3.5 mm)  . ENDOVASCULAR REPAIR/STENT GRAFT  07/31/2016  . INCISION AND DRAINAGE PERIRECTAL ABSCESS N/A 11/06/2015   Procedure: IRRIGATION AND DEBRIDEMENT PERIRECTAL ABSCESS, POSSIBLE HEMORRHOIDECTOMY;  Surgeon: Coralie Keens, MD;  Location: [place];  Service: General;  Laterality: N/A;  . ORIF SHOULDER FRACTURE Right    "fell out  of back end of a truck"    Family History  Problem Relation Age of Onset  . Heart attack Mother   . Diabetes Mother   . Heart attack Father   . Hypertension Father   . Heart attack Brother   . Diabetes Brother     Allergies  Allergen Reactions  . Contrast Media [Iodinated Diagnostic Agents] Anaphylaxis    Current Outpatient Medications on File Prior to Visit  Medication Sig Dispense Refill  . ACCU-CHEK AVIVA PLUS test strip CHECK BLOOD SUGAR ONCE DAILY AS NEEDED 100 each 2  . atorvastatin (LIPITOR) 20 MG tablet TAKE 1 TABLET BY MOUTH EVERY DAY 90 tablet 1  . clopidogrel (PLAVIX) 75 MG tablet TAKE 1 TABLET BY MOUTH EVERY DAY 90 tablet 3  . diltiazem (CARDIZEM CD) 360 MG 24 hr capsule Take 1 capsule (360 mg total) by mouth daily. 90 capsule 4  . diphenhydrAMINE (BENADRYL) 25 mg capsule Take 2 capsules (50 mg) by mouth 1 hour prior to your procedure. 2 capsule 1  . ELIQUIS 5 MG TABS tablet TAKE 1 TABLET (5 MG TOTAL) BY MOUTH 2 (TWO) TIMES DAILY. 60 tablet 5  . furosemide (LASIX) 40 MG tablet Take 1 tablet (40 mg total) by mouth daily as needed. 90 tablet 2  . glipiZIDE (GLUCOTROL XL) 5 MG 24 hr tablet Take 1 tablet (5 mg total) by mouth daily with breakfast. 90 tablet 3  . hydrALAZINE (APRESOLINE) 25 MG tablet Take 1 tablet (25 mg total) by mouth 3 (three) times daily. 270 tablet 3  . levalbuterol (XOPENEX HFA) 45 MCG/ACT inhaler Inhale 1-2 puffs into the lungs every 6 (six) hours as needed for wheezing. 1 Inhaler 6  . liraglutide (VICTOZA) 18 MG/3ML SOPN Inject 0.2 mLs (1.2 mg total) into the skin daily. 9 mL 1  . metFORMIN (GLUCOPHAGE) 1000 MG tablet TAKE 1 TABLET TWICE DAILY WITH A MEAL 180 tablet 2  . metoprolol (LOPRESSOR) 100 MG tablet Take 1 tablet (100 mg total) by mouth 2 (two) times daily. 180 tablet 1  . nitroGLYCERIN (NITROSTAT) 0.4 MG SL tablet Place 1 tablet (0.4 mg total) under the tongue every 5 (five) minutes as needed for chest pain. X 3 doses 25 tablet 4  .  polyethylene glycol (MIRALAX / GLYCOLAX) packet Take 17 g by mouth daily as needed for mild constipation.    . Tiotropium Bromide-Olodaterol (STIOLTO RESPIMAT) 2.5-2.5 MCG/ACT AERS Inhale 2 puffs into the lungs daily. 2 Inhaler 0   No current facility-administered medications on file prior to visit.     BP (!) 148/80 (BP Location: Left Arm, Patient Position: Sitting, Cuff Size: Normal)   Pulse 89   Ht 5\' 8"  (1.727  m)   Wt 199 lb 9.6 oz (90.5 kg)   SpO2 94%   BMI 30.35 kg/m     Review of Systems  Constitutional: Negative for appetite change, chills, fatigue and fever.  HENT: Negative for congestion, dental problem, ear pain, hearing loss, sore throat, tinnitus, trouble swallowing and voice change.   Eyes: Negative for pain, discharge and visual disturbance.  Respiratory: Positive for cough. Negative for chest tightness, wheezing and stridor.   Cardiovascular: Negative for chest pain, palpitations and leg swelling.  Gastrointestinal: Negative for abdominal distention, abdominal pain, blood in stool, constipation, diarrhea, nausea and vomiting.  Genitourinary: Negative for difficulty urinating, discharge, flank pain, genital sores, hematuria and urgency.  Musculoskeletal: Positive for back pain. Negative for arthralgias, gait problem, joint swelling, myalgias and neck stiffness.  Skin: Negative for rash.  Neurological: Negative for dizziness, syncope, speech difficulty, weakness, numbness and headaches.  Hematological: Negative for adenopathy. Does not bruise/bleed easily.  Psychiatric/Behavioral: Negative for behavioral problems and dysphoric mood. The patient is not nervous/anxious.        Objective:   Physical Exam  Constitutional: He appears well-nourished. No distress.  Blood pressure 140/80  Musculoskeletal:  Attempts to have patient assume the supine position resulted in paroxysms of severe lumbar pain Ambulatory Able to stand from a sitting position and transferred to the  examining table without difficulty or assistance          Assessment & Plan:   Low back pain.  Patient is on anticoagulation.  Will treat with low-dose oral prednisone for 6 days add a muscle relaxant Gentle stretching exercises discussed and encouraged  Diabetes mellitus.  Follow-up in 2 months as scheduled  Nyoka Cowden

## 2017-01-14 NOTE — Patient Instructions (Addendum)
Most patients with low back pain will improve with time over the next two to 6 weeks.  Keep active but avoid any activities that cause pain.  Apply moist heat to the low back area several times daily.  Prednisone 1 tablet twice daily   Use muscle relaxer every 6 hours as necessary       Back Exercises If you have pain in your back, do these exercises 2-3 times each day or as told by your doctor. When the pain goes away, do the exercises once each day, but repeat the steps more times for each exercise (do more repetitions). If you do not have pain in your back, do these exercises once each day or as told by your doctor. Exercises Single Knee to Chest  Do these steps 3-5 times in a row for each leg: 1. Lie on your back on a firm bed or the floor with your legs stretched out. 2. Bring one knee to your chest. 3. Hold your knee to your chest by grabbing your knee or thigh. 4. Pull on your knee until you feel a gentle stretch in your lower back. 5. Keep doing the stretch for 10-30 seconds. 6. Slowly let go of your leg and straighten it.  Pelvic Tilt  Do these steps 5-10 times in a row: 1. Lie on your back on a firm bed or the floor with your legs stretched out. 2. Bend your knees so they point up to the ceiling. Your feet should be flat on the floor. 3. Tighten your lower belly (abdomen) muscles to press your lower back against the floor. This will make your tailbone point up to the ceiling instead of pointing down to your feet or the floor. 4. Stay in this position for 5-10 seconds while you gently tighten your muscles and breathe evenly.  Cat-Cow  Do these steps until your lower back bends more easily: 1. Get on your hands and knees on a firm surface. Keep your hands under your shoulders, and keep your knees under your hips. You may put padding under your knees. 2. Let your head hang down, and make your tailbone point down to the floor so your lower back is round like the back of a  cat. 3. Stay in this position for 5 seconds. 4. Slowly lift your head and make your tailbone point up to the ceiling so your back hangs low (sags) like the back of a cow. 5. Stay in this position for 5 seconds.  Press-Ups  Do these steps 5-10 times in a row: 1. Lie on your belly (face-down) on the floor. 2. Place your hands near your head, about shoulder-width apart. 3. While you keep your back relaxed and keep your hips on the floor, slowly straighten your arms to raise the top half of your body and lift your shoulders. Do not use your back muscles. To make yourself more comfortable, you may change where you place your hands. 4. Stay in this position for 5 seconds. 5. Slowly return to lying flat on the floor.  Bridges  Do these steps 10 times in a row: 1. Lie on your back on a firm surface. 2. Bend your knees so they point up to the ceiling. Your feet should be flat on the floor. 3. Tighten your butt muscles and lift your butt off of the floor until your waist is almost as high as your knees. If you do not feel the muscles working in your butt and the back  of your thighs, slide your feet 1-2 inches farther away from your butt. 4. Stay in this position for 3-5 seconds. 5. Slowly lower your butt to the floor, and let your butt muscles relax.  If this exercise is too easy, try doing it with your arms crossed over your chest. Belly Crunches  Do these steps 5-10 times in a row: 1. Lie on your back on a firm bed or the floor with your legs stretched out. 2. Bend your knees so they point up to the ceiling. Your feet should be flat on the floor. 3. Cross your arms over your chest. 4. Tip your chin a little bit toward your chest but do not bend your neck. 5. Tighten your belly muscles and slowly raise your chest just enough to lift your shoulder blades a tiny bit off of the floor. 6. Slowly lower your chest and your head to the floor.  Back Lifts Do these steps 5-10 times in a row: 1. Lie  on your belly (face-down) with your arms at your sides, and rest your forehead on the floor. 2. Tighten the muscles in your legs and your butt. 3. Slowly lift your chest off of the floor while you keep your hips on the floor. Keep the back of your head in line with the curve in your back. Look at the floor while you do this. 4. Stay in this position for 3-5 seconds. 5. Slowly lower your chest and your face to the floor.  Contact a doctor if:  Your back pain gets a lot worse when you do an exercise.  Your back pain does not lessen 2 hours after you exercise. If you have any of these problems, stop doing the exercises. Do not do them again unless your doctor says it is okay. Get help right away if:  You have sudden, very bad back pain. If this happens, stop doing the exercises. Do not do them again unless your doctor says it is okay. This information is not intended to replace advice given to you by your health care provider. Make sure you discuss any questions you have with your health care provider. Document Released: 02/21/2010 Document Revised: 06/27/2015 Document Reviewed: 03/15/2014 Elsevier Interactive Patient Education  [employer].

## 2017-02-04 ENCOUNTER — Telehealth: Payer: Self-pay | Admitting: Surgery

## 2017-02-04 NOTE — Telephone Encounter (Signed)
Spoke to patient's wife regarding the patient's upcoming appt for CTA on 03/15/17 at 9:30am at the [place] location of [facility]. They ask the patient to arrive at 9am with no solid foods 4 hours prior. He is also scheduled to see Dr.Brabham at 12:15pm on that same date. I mailed a letter w/ the above info to him as well. awt

## 2017-02-22 ENCOUNTER — Encounter: Payer: Self-pay | Admitting: Internal Medicine

## 2017-02-22 ENCOUNTER — Ambulatory Visit (INDEPENDENT_AMBULATORY_CARE_PROVIDER_SITE_OTHER): Payer: Medicare Other | Admitting: Internal Medicine

## 2017-02-22 VITALS — BP 138/82 | HR 67 | Temp 97.6°F | Ht 68.0 in | Wt 195.2 lb

## 2017-02-22 DIAGNOSIS — I10 Essential (primary) hypertension: Secondary | ICD-10-CM | POA: Diagnosis not present

## 2017-02-22 DIAGNOSIS — I482 Chronic atrial fibrillation, unspecified: Secondary | ICD-10-CM

## 2017-02-22 DIAGNOSIS — J42 Unspecified chronic bronchitis: Secondary | ICD-10-CM | POA: Diagnosis not present

## 2017-02-22 DIAGNOSIS — E1151 Type 2 diabetes mellitus with diabetic peripheral angiopathy without gangrene: Secondary | ICD-10-CM

## 2017-02-22 LAB — POCT GLYCOSYLATED HEMOGLOBIN (HGB A1C): Hemoglobin A1C: 7.8

## 2017-02-22 MED ORDER — GLIPIZIDE ER 10 MG PO TB24: 10.0000 mg | ORAL_TABLET | Freq: Every day | ORAL | 4 refills | Status: DC

## 2017-02-22 NOTE — Patient Instructions (Signed)
Limit your sodium (Salt) intake    It is important that you exercise regularly, at least 20 minutes 3 to 4 times per week.  If you develop chest pain or shortness of breath seek  medical attention.   Please check your hemoglobin A1c every 3 months  You need to lose weight.  Consider a lower calorie diet and regular exercise.   

## 2017-02-22 NOTE — Progress Notes (Signed)
Subjective:    Patient ID: Jason Fisher, male    DOB: 11-03-1940, 77 y.o.   MRN: [id number]  HPI  Lab Results  Component Value Date   HGBA1C 8.5 (H) 12/01/2016    77 year old patient who is seen today for follow-up of essential hypertension.  He has a history of chronic atrial fibrillation and remains on chronic anticoagulation. Doing fairly well.  Due to cost concerns, he has only uptitrated Victoza to 0.6 mg.  Lab Results  Component Value Date   HGBA1C 7.8 02/22/2017   His cardiac status has been stable.  He generally feels well.  Past Medical History:  Diagnosis Date  . 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 BENIGN PROSTATIC HYPERTROPHY, HX OF 02/24/2007  . CAD S/P PCI    a. 2007 Inf STEMI s/p Vision BMS  2 to RCA;  b. 2009 ISR Prox RCA Stent-->with DES;  c. 09/2015 PCI: LM nl, LAD 30p/m, D1 90, LCX nl, OM1 small, 80, OM2 90 (2.5x14 Biofreedom stent), OM3 small, RCA 30p ISR, 52m ISR, 83m, 85d, EF 50-55%.  . 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 Chronic atrial fibrillation ([place]) 11/06/2009   a. On Eliquis (CHA2DS2VASc = 5).  Rate controlled w/ BB and CCB.  Marland Kitchen Chronic diastolic CHF (congestive heart failure) ([place])    a. 09/2015 Echo: EF 55-60%, mildly dil LA.  . CKD (chronic kidney disease), stage III ([place])    they mentioned this to the patient but they would work on it later after the aneurysm  . COLONIC POLYPS, HX OF 11/02/2006  . COPD (chronic obstructive pulmonary disease) ([place])   . DIABETES MELLITUS, TYPE II 10/29/2006  . Diverticulosis   . History of tobacco abuse    a. Quit 09/2015.  Marland Kitchen HYPERLIPIDEMIA 10/29/2006  . Hypertensive heart disease with heart failure ([place])   . Pneumonia 09/2015   [name] 07/31/2016  . STEMI (ST elevation myocardial infarction) [facility] 2007   [name] 07/31/2016     Social History   Socioeconomic History  . Marital status: Married    Spouse name: Not on file  . Number of children: 5  . Years of education: Not on file  . Highest education  level: Not on file  Social Needs  . Financial resource strain: Not on file  . Food insecurity - worry: Not on file  . Food insecurity - inability: Not on file  . Transportation needs - medical: Not on file  . Transportation needs - non-medical: Not on file  Occupational History  . Occupation: [employer]  Tobacco Use  . Smoking status: Former Smoker    Packs/day: 1.00    Years: 49.00    Pack years: 49.00    Types: Cigarettes    Last attempt to quit: 09/08/2015    Years since quitting: 1.4  . Smokeless tobacco: Never Used  . Tobacco comment: passive smoker as well  Substance and Sexual Activity  . Alcohol use: No    Alcohol/week: 0.0 oz  . Drug use: No  . Sexual activity: Not on file  Other Topics Concern  . Not on file  Social History Narrative  . Not on file    Past Surgical History:  Procedure Laterality Date  . ABDOMINAL AORTIC ENDOVASCULAR STENT GRAFT N/A 07/31/2016   Procedure: ABDOMINAL AORTIC ENDOVASCULAR STENT GRAFT;  Surgeon: Serafina Mitchell, MD;  Location: [facility];  Service: Vascular;  Laterality: N/A;  . CARDIAC CATHETERIZATION N/A 09/26/2015   Procedure: Left Heart Cath and Coronary  Angiography;  Surgeon: Laurianne Floresca M Martinique, MD;  Location: [place] [facility];  Service: Cardiovascular;  Laterality: N/A;  . CARDIAC CATHETERIZATION N/A 09/26/2015   Procedure: Coronary Stent Intervention;  Surgeon: Irish Breisch M Martinique, MD;  Location: [place] [facility];  Service: Cardiovascular;  Laterality: N/A;  . CATARACT EXTRACTION Bilateral   . COLONOSCOPY    . CORONARY ANGIOPLASTY WITH STENT PLACEMENT  2007   Inferior STEMI 2007 - RCA PCI with 2 Vision BMS; Abnormal Myoview in 2009 - pRCA ins-stent restenosis & unstented disease - DES PCI Promus 3.0 x 20 mm (3.5 mm)  . ENDOVASCULAR REPAIR/STENT GRAFT  07/31/2016  . INCISION AND DRAINAGE PERIRECTAL ABSCESS N/A 11/06/2015   Procedure: IRRIGATION AND DEBRIDEMENT PERIRECTAL ABSCESS, POSSIBLE HEMORRHOIDECTOMY;  Surgeon: Coralie Keens, MD;   Location: [facility];  Service: General;  Laterality: N/A;  . ORIF SHOULDER FRACTURE Right    "fell out of back end of a truck"    Family History  Problem Relation Age of Onset  . Heart attack Mother   . Diabetes Mother   . Heart attack Father   . Hypertension Father   . Heart attack Brother   . Diabetes Brother     Allergies  Allergen Reactions  . Contrast Media [Iodinated Diagnostic Agents] Anaphylaxis    Current Outpatient Medications on File Prior to Visit  Medication Sig Dispense Refill  . ACCU-CHEK AVIVA PLUS test strip CHECK BLOOD SUGAR ONCE DAILY AS NEEDED 100 each 2  . atorvastatin (LIPITOR) 20 MG tablet TAKE 1 TABLET BY MOUTH EVERY DAY 90 tablet 1  . clopidogrel (PLAVIX) 75 MG tablet TAKE 1 TABLET BY MOUTH EVERY DAY 90 tablet 3  . diltiazem (CARDIZEM CD) 360 MG 24 hr capsule Take 1 capsule (360 mg total) by mouth daily. 90 capsule 4  . ELIQUIS 5 MG TABS tablet TAKE 1 TABLET (5 MG TOTAL) BY MOUTH 2 (TWO) TIMES DAILY. 60 tablet 5  . furosemide (LASIX) 40 MG tablet Take 1 tablet (40 mg total) by mouth daily as needed. 90 tablet 2  . hydrALAZINE (APRESOLINE) 25 MG tablet Take 1 tablet (25 mg total) by mouth 3 (three) times daily. 270 tablet 3  . levalbuterol (XOPENEX HFA) 45 MCG/ACT inhaler Inhale 1-2 puffs into the lungs every 6 (six) hours as needed for wheezing. 1 Inhaler 6  . liraglutide (VICTOZA) 18 MG/3ML SOPN Inject 0.2 mLs (1.2 mg total) into the skin daily. 9 mL 1  . metFORMIN (GLUCOPHAGE) 1000 MG tablet TAKE 1 TABLET TWICE DAILY WITH A MEAL 180 tablet 2  . metoprolol (LOPRESSOR) 100 MG tablet Take 1 tablet (100 mg total) by mouth 2 (two) times daily. 180 tablet 1  . nitroGLYCERIN (NITROSTAT) 0.4 MG SL tablet Place 1 tablet (0.4 mg total) under the tongue every 5 (five) minutes as needed for chest pain. X 3 doses 25 tablet 4  . polyethylene glycol (MIRALAX / GLYCOLAX) packet Take 17 g by mouth daily as needed for mild constipation.    . Tiotropium Bromide-Olodaterol  (STIOLTO RESPIMAT) 2.5-2.5 MCG/ACT AERS Inhale 2 puffs into the lungs daily. 2 Inhaler 0   No current facility-administered medications on file prior to visit.     BP 138/82 (BP Location: Left Arm, Patient Position: Sitting, Cuff Size: Normal)   Pulse 67   Temp 97.6 F (36.4 C) (Oral)   Ht 5\' 8"  (1.727 m)   Wt 195 lb 3.2 oz (88.5 kg)   SpO2 95%   BMI 29.68 kg/m  Review of Systems  Constitutional: Negative for appetite change, chills, fatigue and fever.  HENT: Negative for congestion, dental problem, ear pain, hearing loss, sore throat, tinnitus, trouble swallowing and voice change.   Eyes: Negative for pain, discharge and visual disturbance.  Respiratory: Negative for cough, chest tightness, wheezing and stridor.   Cardiovascular: Negative for chest pain, palpitations and leg swelling.  Gastrointestinal: Negative for abdominal distention, abdominal pain, blood in stool, constipation, diarrhea, nausea and vomiting.  Genitourinary: Negative for difficulty urinating, discharge, flank pain, genital sores, hematuria and urgency.  Musculoskeletal: Negative for arthralgias, back pain, gait problem, joint swelling, myalgias and neck stiffness.  Skin: Negative for rash.  Neurological: Negative for dizziness, syncope, speech difficulty, weakness, numbness and headaches.  Hematological: Negative for adenopathy. Does not bruise/bleed easily.  Psychiatric/Behavioral: Negative for behavioral problems and dysphoric mood. The patient is not nervous/anxious.        Objective:   Physical Exam  Blood pressure 130/80 ENT unremarkable Neck no bruits or neck vein distention Cardiovascular-controlled ventricular response Abdomen obese soft and nontender Extremities no edema      Assessment & Plan:   Diabetes mellitus.  Improved.  Will increase glipizide to 10 mg daily.  Otherwise medical regimen unchanged Essential hypertension stable Chronic kidney disease  Follow-up 3  months Medications updated  Nyoka Cowden

## 2017-02-26 ENCOUNTER — Other Ambulatory Visit: Payer: Self-pay | Admitting: Internal Medicine

## 2017-03-03 ENCOUNTER — Ambulatory Visit: Payer: Medicare Other | Admitting: Internal Medicine

## 2017-03-15 ENCOUNTER — Ambulatory Visit
Admission: RE | Admit: 2017-03-15 | Discharge: 2017-03-15 | Disposition: A | Discharge status: Home / Self Care | Payer: Medicare Other | Source: Ambulatory Visit | Attending: Surgery | Admitting: Surgery

## 2017-03-15 ENCOUNTER — Encounter: Payer: Self-pay | Admitting: Surgery

## 2017-03-15 ENCOUNTER — Ambulatory Visit: Payer: Medicare Other | Admitting: Surgery

## 2017-03-15 VITALS — BP 170/98 | HR 97 | Resp 20 | Ht 68.0 in | Wt 198.8 lb

## 2017-03-15 DIAGNOSIS — I714 Abdominal aortic aneurysm, without rupture, unspecified: Secondary | ICD-10-CM

## 2017-03-15 MED ORDER — IOPAMIDOL (ISOVUE-370) INJECTION 76%
75.0000 mL | Freq: Once | INTRAVENOUS | Status: AC | PRN
  Administered 2017-03-15: 75 mL via INTRAVENOUS

## 2017-03-15 NOTE — Progress Notes (Signed)
HISTORY AND PHYSICAL     CC:  Follow up EVAR Requesting Provider:  Marletta Lor, MD  HPI: This is a 77 y.o. male who underwent EVAR on July 31, 2016 by Dr. Trula Slade.  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  Dr. Trula Slade did not recommend any intervention and pt was to follow up in 6 months with CTA.    He states that he has not had any abdominal pain.  He has had some lower back pain that started on the right and then moved to the left.  He went to see his PCP and was given muscle relaxer and prednisone.  He states that this did help, but the pain is back.  He denies any painful urination or blood in his urine.    He is on Eliquis for afib.  He also takes Plavix.  His is on a statin daily for cholesterol management.  He is on oral agents for his diabetes.  He takes a CCB and beta blocker for blood pressure management.   He quit smoking a couple of years ago.  Past Medical History:  Diagnosis Date  . 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 BENIGN PROSTATIC HYPERTROPHY, HX OF 02/24/2007  . CAD S/P PCI    a. 2007 Inf STEMI s/p Vision BMS  2 to RCA;  b. 2009 ISR Prox RCA Stent-->with DES;  c. 09/2015 PCI: LM nl, LAD 30p/m, D1 90, LCX nl, OM1 small, 80, OM2 90 (2.5x14 Biofreedom stent), OM3 small, RCA 30p ISR, 60m ISR, 87m, 85d, EF 50-55%.  . 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 Chronic atrial fibrillation ([place]) 11/06/2009   a. On Eliquis (CHA2DS2VASc = 5).  Rate controlled w/ BB and CCB.  Marland Kitchen Chronic diastolic CHF (congestive heart failure) ([place])    a. 09/2015 Echo: EF 55-60%, mildly dil LA.  . CKD (chronic kidney disease), stage III ([place])    they mentioned this to the patient but they would work on it later after the aneurysm  . COLONIC POLYPS, HX OF 11/02/2006  . COPD (chronic obstructive pulmonary disease) ([place])   . DIABETES MELLITUS, TYPE II 10/29/2006  . Diverticulosis   . History of tobacco abuse    a. Quit 09/2015.  Marland Kitchen  HYPERLIPIDEMIA 10/29/2006  . Hypertensive heart disease with heart failure ([place])   . Pneumonia 09/2015   [name] 07/31/2016  . STEMI (ST elevation myocardial infarction) [facility]) 2007   [name] 07/31/2016    Past Surgical History:  Procedure Laterality Date  . ABDOMINAL AORTIC ENDOVASCULAR STENT GRAFT N/A 07/31/2016   Procedure: ABDOMINAL AORTIC ENDOVASCULAR STENT GRAFT;  Surgeon: Serafina Mitchell, MD;  Location: [place];  Service: Vascular;  Laterality: N/A;  . CARDIAC CATHETERIZATION N/A 09/26/2015   Procedure: Left Heart Cath and Coronary Angiography;  Surgeon: Peter M Martinique, MD;  Location: [place] CV [facility];  Service: Cardiovascular;  Laterality: N/A;  . CARDIAC CATHETERIZATION N/A 09/26/2015   Procedure: Coronary Stent Intervention;  Surgeon: Peter M Martinique, MD;  Location: [place] CV [facility];  Service: Cardiovascular;  Laterality: N/A;  . CATARACT EXTRACTION Bilateral   . COLONOSCOPY    . CORONARY ANGIOPLASTY WITH STENT PLACEMENT  2007   Inferior STEMI 2007 - RCA PCI with 2 Vision BMS; Abnormal Myoview in 2009 - pRCA ins-stent restenosis & unstented disease - DES PCI Promus 3.0 x 20 mm (3.5 mm)  . ENDOVASCULAR REPAIR/STENT GRAFT  07/31/2016  .  INCISION AND DRAINAGE PERIRECTAL ABSCESS N/A 11/06/2015   Procedure: IRRIGATION AND DEBRIDEMENT PERIRECTAL ABSCESS, POSSIBLE HEMORRHOIDECTOMY;  Surgeon: Coralie Keens, MD;  Location: [place];  Service: General;  Laterality: N/A;  . ORIF SHOULDER FRACTURE Right    "fell out of back end of a truck"    Allergies  Allergen Reactions  . Contrast Media [Iodinated Diagnostic Agents] Anaphylaxis    Current Outpatient Medications  Medication Sig Dispense Refill  . ACCU-CHEK AVIVA PLUS test strip CHECK BLOOD SUGAR ONCE DAILY AS NEEDED 100 each 2  . atorvastatin (LIPITOR) 20 MG tablet TAKE 1 TABLET BY MOUTH EVERY DAY 90 tablet 1  . clopidogrel (PLAVIX) 75 MG tablet TAKE 1 TABLET BY MOUTH EVERY DAY 90 tablet 3  . diltiazem (CARDIZEM CD) 360 MG 24 hr capsule  Take 1 capsule (360 mg total) by mouth daily. 90 capsule 4  . ELIQUIS 5 MG TABS tablet TAKE 1 TABLET (5 MG TOTAL) BY MOUTH 2 (TWO) TIMES DAILY. 60 tablet 5  . furosemide (LASIX) 40 MG tablet Take 1 tablet (40 mg total) by mouth daily as needed. 90 tablet 2  . glimepiride (AMARYL) 4 MG tablet TAKE 1 TABLET BY MOUTH EVERY DAY BEFORE BREAKFAST 90 tablet 1  . glipiZIDE (GLUCOTROL XL) 10 MG 24 hr tablet Take 1 tablet (10 mg total) by mouth daily with breakfast. 90 tablet 4  . hydrALAZINE (APRESOLINE) 25 MG tablet Take 1 tablet (25 mg total) by mouth 3 (three) times daily. 270 tablet 3  . levalbuterol (XOPENEX HFA) 45 MCG/ACT inhaler Inhale 1-2 puffs into the lungs every 6 (six) hours as needed for wheezing. 1 Inhaler 6  . liraglutide (VICTOZA) 18 MG/3ML SOPN Inject 0.2 mLs (1.2 mg total) into the skin daily. 9 mL 1  . metFORMIN (GLUCOPHAGE) 1000 MG tablet TAKE 1 TABLET TWICE DAILY WITH A MEAL 180 tablet 2  . metoprolol (LOPRESSOR) 100 MG tablet Take 1 tablet (100 mg total) by mouth 2 (two) times daily. 180 tablet 1  . nitroGLYCERIN (NITROSTAT) 0.4 MG SL tablet Place 1 tablet (0.4 mg total) under the tongue every 5 (five) minutes as needed for chest pain. X 3 doses 25 tablet 4  . polyethylene glycol (MIRALAX / GLYCOLAX) packet Take 17 g by mouth daily as needed for mild constipation.    . Tiotropium Bromide-Olodaterol (STIOLTO RESPIMAT) 2.5-2.5 MCG/ACT AERS Inhale 2 puffs into the lungs daily. 2 Inhaler 0   No current facility-administered medications for this visit.     Family History  Problem Relation Age of Onset  . Heart attack Mother   . Diabetes Mother   . Heart attack Father   . Hypertension Father   . Heart attack Brother   . Diabetes Brother     Social History   Socioeconomic History  . Marital status: Married    Spouse name: Not on file  . Number of children: 5  . Years of education: Not on file  . Highest education level: Not on file  Social Needs  . Financial resource  strain: Not on file  . Food insecurity - worry: Not on file  . Food insecurity - inability: Not on file  . Transportation needs - medical: Not on file  . Transportation needs - non-medical: Not on file  Occupational History  . Occupation: [employer]  Tobacco Use  . Smoking status: Former Smoker    Packs/day: 1.00    Years: 49.00    Pack years: 49.00    Types: Cigarettes  Last attempt to quit: 09/08/2015    Years since quitting: 1.5  . Smokeless tobacco: Never Used  . Tobacco comment: passive smoker as well  Substance and Sexual Activity  . Alcohol use: No    Alcohol/week: 0.0 oz  . Drug use: No  . Sexual activity: Not on file  Other Topics Concern  . Not on file  Social History Narrative  . Not on file     REVIEW OF SYSTEMS:   [X]  denotes positive finding, [ ]  denotes negative finding Cardiac  Comments:  Chest pain or chest pressure:    Shortness of breath upon exertion:    Short of breath when lying flat:    Irregular heart rhythm:        Vascular    Pain in calf, thigh, or hip brought on by ambulation:    Pain in feet at night that wakes you up from your sleep:     Blood clot in your veins:    Leg swelling:         Pulmonary    Oxygen at home:    Productive cough:     Wheezing:         Neurologic    Sudden weakness in arms or legs:     Sudden numbness in arms or legs:     Sudden onset of difficulty speaking or slurred speech:    Temporary loss of vision in one eye:     Problems with dizziness:         Gastrointestinal    Blood in stool:     Vomited blood:         Genitourinary    Burning when urinating:     Blood in urine:        Psychiatric    Major depression:         Hematologic    Bleeding problems:    Problems with blood clotting too easily:        Skin    Rashes or ulcers:        Constitutional    Fever or chills:      PHYSICAL EXAMINATION:  Vitals:   03/15/17 1306 03/15/17 1307  BP: (!) 172/90 (!) 170/98  Pulse: 97   Resp:  20   SpO2: 95%    Vitals:   03/15/17 1306  Weight: 198 lb 12.8 oz (90.2 kg)  Height: 5\' 8"  (1.727 m)   Body mass index is 30.23 kg/m.  General:  WDWN in NAD; vital signs documented above Gait: Not observed HENT: WNL, normocephalic Pulmonary: normal non-labored breathing , without Rales, rhonchi,  wheezing Cardiac: irregular HR, without  Murmurs without carotid bruits Abdomen: soft, NT, no masses Skin: without rashes Vascular Exam/Pulses:  Right Left  Radial 2+ (normal) 2+ (normal)  Ulnar 2+ (normal) 2+ (normal)  Popliteal Unable to palpate  Unable to palpate   DP Unable to palpate  Unable to palpate   PT Faintly palpable Faintly palpable   Extremities: without ischemic changes, without Gangrene , without cellulitis; without open wounds;  Musculoskeletal: no muscle wasting or atrophy  Neurologic: A&O X 3;  No focal weakness or paresthesias are detected Psychiatric:  The pt has Normal affect.   Non-Invasive Vascular Imaging:   CTA 03/15/17: IMPRESSION: Status post stent graft repair of the infrarenal abdominal aortic aneurysm. Resolved distal aortic type 2 endoleak. Stable native aneurysm sac measurements.  Stable benign renal cysts  7 mm bladder calculus without obstruction  Aortic atherosclerosis  Pt  meds includes: Statin:  Yes.   Beta Blocker:  Yes.   Aspirin:  No. ACEI:  No. ARB:  No. CCB use:  Yes Other Antiplatelet/Anticoagulant:  Yes Eliquis   ASSESSMENT/PLAN:: 77 y.o. male who underwent EVAR on 07/31/16 by Dr. Trula Slade.   -at his last visit in August 2018 on his follow up scan, he did have a type II endoleak present.  On CT scan today, there has been resolution of the type II endoleak.  The native aneurysm sac  Is stable.   -he has had some lower back pain over the past couple of weeks that started on the right side and moved to the left.  On CT scan today, he did have a 46mm bladder calculus (non obstructive).  He does not have any CVA tenderness,  hematuria, or dysuria.   Most likely not related to his back pain given this improved with muscle relaxer and prednisone.  He is going to try the Salonpas patches to see if he can get some relief from that.  He will f/u with his PCP and may need referral to spine MD if no other issues can be found. -he will f/u in 6 months with abdominal u/s   Leontine Locket, PA-C [facility] [phone]  Clinic MD:  Pt seen and examined with Dr. Trula Slade I agree with the above.  I have seen and evaluated the patient.  CT scan shows resolution of endoleak.  Maximum aortic diameter is 4.9 cm.  He will follow-up in 6 months with repeat ultrasound.  Annamarie Major

## 2017-03-16 ENCOUNTER — Encounter (INDEPENDENT_AMBULATORY_CARE_PROVIDER_SITE_OTHER): Payer: Medicare Other | Admitting: Ophthalmology

## 2017-03-22 ENCOUNTER — Encounter: Payer: Self-pay | Admitting: Internal Medicine

## 2017-03-22 ENCOUNTER — Ambulatory Visit (INDEPENDENT_AMBULATORY_CARE_PROVIDER_SITE_OTHER): Payer: Medicare Other | Admitting: Internal Medicine

## 2017-03-22 ENCOUNTER — Encounter (INDEPENDENT_AMBULATORY_CARE_PROVIDER_SITE_OTHER): Payer: Medicare Other | Admitting: Ophthalmology

## 2017-03-22 VITALS — BP 170/100 | HR 76 | Temp 98.6°F | Ht 68.0 in | Wt 201.8 lb

## 2017-03-22 DIAGNOSIS — H35033 Hypertensive retinopathy, bilateral: Secondary | ICD-10-CM | POA: Diagnosis not present

## 2017-03-22 DIAGNOSIS — H43813 Vitreous degeneration, bilateral: Secondary | ICD-10-CM

## 2017-03-22 DIAGNOSIS — E1151 Type 2 diabetes mellitus with diabetic peripheral angiopathy without gangrene: Secondary | ICD-10-CM | POA: Diagnosis not present

## 2017-03-22 DIAGNOSIS — I10 Essential (primary) hypertension: Secondary | ICD-10-CM

## 2017-03-22 DIAGNOSIS — H348332 Tributary (branch) retinal vein occlusion, bilateral, stable: Secondary | ICD-10-CM

## 2017-03-22 DIAGNOSIS — M545 Low back pain, unspecified: Secondary | ICD-10-CM

## 2017-03-22 DIAGNOSIS — E11319 Type 2 diabetes mellitus with unspecified diabetic retinopathy without macular edema: Secondary | ICD-10-CM

## 2017-03-22 DIAGNOSIS — E113293 Type 2 diabetes mellitus with mild nonproliferative diabetic retinopathy without macular edema, bilateral: Secondary | ICD-10-CM

## 2017-03-22 LAB — HM DIABETES EYE EXAM

## 2017-03-22 NOTE — Progress Notes (Signed)
Subjective:    Patient ID: Jason Fisher, male    DOB: 05/22/1940, 77 y.o.   MRN: [id number]  HPI 77 year old patient without prior history of back pain.  Last week he experienced significant lumbar pain initially involving the right lumbar area.  This was quite painful but resolved and then became problematic in the left lumbar area.  Pain was localized and there is no radicular component. He used Tylenol and rest and has been pain-free for the past 2 days.  He has a history of diabetes.  Medical regimen includes Victoza.  Apparently he has not up titrated to 1.2 mg.  Past Medical History:  Diagnosis Date  . 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 BENIGN PROSTATIC HYPERTROPHY, HX OF 02/24/2007  . CAD S/P PCI    a. 2007 Inf STEMI s/p Vision BMS  2 to RCA;  b. 2009 ISR Prox RCA Stent-->with DES;  c. 09/2015 PCI: LM nl, LAD 30p/m, D1 90, LCX nl, OM1 small, 80, OM2 90 (2.5x14 Biofreedom stent), OM3 small, RCA 30p ISR, 11m ISR, 63m, 85d, EF 50-55%.  . 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 Chronic atrial fibrillation ([place]) 11/06/2009   a. On Eliquis (CHA2DS2VASc = 5).  Rate controlled w/ BB and CCB.  Marland Kitchen Chronic diastolic CHF (congestive heart failure) ([place])    a. 09/2015 Echo: EF 55-60%, mildly dil LA.  . CKD (chronic kidney disease), stage III ([place])    they mentioned this to the patient but they would work on it later after the aneurysm  . COLONIC POLYPS, HX OF 11/02/2006  . COPD (chronic obstructive pulmonary disease) ([place])   . DIABETES MELLITUS, TYPE II 10/29/2006  . Diverticulosis   . History of tobacco abuse    a. Quit 09/2015.  Marland Kitchen HYPERLIPIDEMIA 10/29/2006  . Hypertensive heart disease with heart failure ([place])   . Pneumonia 09/2015   [name] 07/31/2016  . STEMI (ST elevation myocardial infarction) [facility]) 2007   [name] 07/31/2016     Social History   Socioeconomic History  . Marital status: Married    Spouse name: Not on file  . Number of children: 5  . Years of education: Not on  file  . Highest education level: Not on file  Social Needs  . Financial resource strain: Not on file  . Food insecurity - worry: Not on file  . Food insecurity - inability: Not on file  . Transportation needs - medical: Not on file  . Transportation needs - non-medical: Not on file  Occupational History  . Occupation: [employer]  Tobacco Use  . Smoking status: Former Smoker    Packs/day: 1.00    Years: 49.00    Pack years: 49.00    Types: Cigarettes    Last attempt to quit: 09/08/2015    Years since quitting: 1.5  . Smokeless tobacco: Never Used  . Tobacco comment: passive smoker as well  Substance and Sexual Activity  . Alcohol use: No    Alcohol/week: 0.0 oz  . Drug use: No  . Sexual activity: Not on file  Other Topics Concern  . Not on file  Social History Narrative  . Not on file    Past Surgical History:  Procedure Laterality Date  . ABDOMINAL AORTIC ENDOVASCULAR STENT GRAFT N/A 07/31/2016   Procedure: ABDOMINAL AORTIC ENDOVASCULAR STENT GRAFT;  Surgeon: Serafina Mitchell, MD;  Location: [place];  Service: Vascular;  Laterality: N/A;  . CARDIAC CATHETERIZATION N/A 09/26/2015   Procedure: Left Heart Cath  and Coronary Angiography;  Surgeon: Peter M Martinique, MD;  Location: [place] CV LAB;  Service: Cardiovascular;  Laterality: N/A;  . CARDIAC CATHETERIZATION N/A 09/26/2015   Procedure: Coronary Stent Intervention;  Surgeon: Peter M Martinique, MD;  Location: [place] CV LAB;  Service: Cardiovascular;  Laterality: N/A;  . CATARACT EXTRACTION Bilateral   . COLONOSCOPY    . CORONARY ANGIOPLASTY WITH STENT PLACEMENT  2007   Inferior STEMI 2007 - RCA PCI with 2 Vision BMS; Abnormal Myoview in 2009 - pRCA ins-stent restenosis & unstented disease - DES PCI Promus 3.0 x 20 mm (3.5 mm)  . ENDOVASCULAR REPAIR/STENT GRAFT  07/31/2016  . INCISION AND DRAINAGE PERIRECTAL ABSCESS N/A 11/06/2015   Procedure: IRRIGATION AND DEBRIDEMENT PERIRECTAL ABSCESS, POSSIBLE HEMORRHOIDECTOMY;   Surgeon: Coralie Keens, MD;  Location: [place];  Service: General;  Laterality: N/A;  . ORIF SHOULDER FRACTURE Right    "fell out of back end of a truck"    Family History  Problem Relation Age of Onset  . Heart attack Mother   . Diabetes Mother   . Heart attack Father   . Hypertension Father   . Heart attack Brother   . Diabetes Brother     Allergies  Allergen Reactions  . Contrast Media [Iodinated Diagnostic Agents] Anaphylaxis    Current Outpatient Medications on File Prior to Visit  Medication Sig Dispense Refill  . ACCU-CHEK AVIVA PLUS test strip CHECK BLOOD SUGAR ONCE DAILY AS NEEDED 100 each 2  . atorvastatin (LIPITOR) 20 MG tablet TAKE 1 TABLET BY MOUTH EVERY DAY 90 tablet 1  . clopidogrel (PLAVIX) 75 MG tablet TAKE 1 TABLET BY MOUTH EVERY DAY 90 tablet 3  . diltiazem (CARDIZEM CD) 360 MG 24 hr capsule Take 1 capsule (360 mg total) by mouth daily. 90 capsule 4  . ELIQUIS 5 MG TABS tablet TAKE 1 TABLET (5 MG TOTAL) BY MOUTH 2 (TWO) TIMES DAILY. 60 tablet 5  . furosemide (LASIX) 40 MG tablet Take 1 tablet (40 mg total) by mouth daily as needed. 90 tablet 2  . glimepiride (AMARYL) 4 MG tablet TAKE 1 TABLET BY MOUTH EVERY DAY BEFORE BREAKFAST 90 tablet 1  . glipiZIDE (GLUCOTROL XL) 10 MG 24 hr tablet Take 1 tablet (10 mg total) by mouth daily with breakfast. 90 tablet 4  . hydrALAZINE (APRESOLINE) 25 MG tablet Take 1 tablet (25 mg total) by mouth 3 (three) times daily. 270 tablet 3  . levalbuterol (XOPENEX HFA) 45 MCG/ACT inhaler Inhale 1-2 puffs into the lungs every 6 (six) hours as needed for wheezing. 1 Inhaler 6  . liraglutide (VICTOZA) 18 MG/3ML SOPN Inject 0.2 mLs (1.2 mg total) into the skin daily. 9 mL 1  . metFORMIN (GLUCOPHAGE) 1000 MG tablet TAKE 1 TABLET TWICE DAILY WITH A MEAL 180 tablet 2  . metoprolol (LOPRESSOR) 100 MG tablet Take 1 tablet (100 mg total) by mouth 2 (two) times daily. 180 tablet 1  . nitroGLYCERIN (NITROSTAT) 0.4 MG SL tablet Place 1 tablet (0.4  mg total) under the tongue every 5 (five) minutes as needed for chest pain. X 3 doses 25 tablet 4  . polyethylene glycol (MIRALAX / GLYCOLAX) packet Take 17 g by mouth daily as needed for mild constipation.    . Tiotropium Bromide-Olodaterol (STIOLTO RESPIMAT) 2.5-2.5 MCG/ACT AERS Inhale 2 puffs into the lungs daily. 2 Inhaler 0   No current facility-administered medications on file prior to visit.     BP (!) 170/100 (BP Location: Left Arm, Patient  Position: Sitting, Cuff Size: Large)   Pulse 76   Temp 98.6 F (37 C) (Oral)   Ht 5\' 8"  (1.727 m)   Wt 201 lb 12.8 oz (91.5 kg)   SpO2 93%   BMI 30.68 kg/m      Review of Systems  Constitutional: Negative for appetite change, chills, fatigue and fever.  HENT: Negative for congestion, dental problem, ear pain, hearing loss, sore throat, tinnitus, trouble swallowing and voice change.   Eyes: Negative for pain, discharge and visual disturbance.  Respiratory: Negative for cough, chest tightness, wheezing and stridor.   Cardiovascular: Negative for chest pain, palpitations and leg swelling.  Gastrointestinal: Negative for abdominal distention, abdominal pain, blood in stool, constipation, diarrhea, nausea and vomiting.  Genitourinary: Negative for difficulty urinating, discharge, flank pain, genital sores, hematuria and urgency.  Musculoskeletal: Positive for back pain. Negative for arthralgias, gait problem, joint swelling, myalgias and neck stiffness.  Skin: Negative for rash.  Neurological: Negative for dizziness, syncope, speech difficulty, weakness, numbness and headaches.  Hematological: Negative for adenopathy. Does not bruise/bleed easily.  Psychiatric/Behavioral: Negative for behavioral problems and dysphoric mood. The patient is not nervous/anxious.        Objective:   Physical Exam No distress  Able to transfer from a sitting position to the examining table without difficulty or assistance No local tenderness in the  lumbosacral area Negative straight leg test Gait normal        Assessment & Plan:   Low back pain.  Resolved we will continue to observe at the present time Diabetes mellitus.  Will uptitrate Victoza to 1.2 mg daily.  Follow-up 2 months as scheduled  Nyoka Cowden

## 2017-03-22 NOTE — Patient Instructions (Signed)
Call or return to clinic prn if these symptoms worsen or fail to improve as anticipated.  Return as scheduled for follow-up

## 2017-03-24 ENCOUNTER — Encounter: Payer: Self-pay | Admitting: Internal Medicine

## 2017-05-05 ENCOUNTER — Other Ambulatory Visit: Payer: Self-pay | Admitting: Internal Medicine

## 2017-05-24 ENCOUNTER — Encounter: Payer: Self-pay | Admitting: Internal Medicine

## 2017-05-24 ENCOUNTER — Other Ambulatory Visit: Payer: Self-pay | Admitting: Internal Medicine

## 2017-05-24 ENCOUNTER — Ambulatory Visit (INDEPENDENT_AMBULATORY_CARE_PROVIDER_SITE_OTHER): Payer: Medicare Other | Admitting: Internal Medicine

## 2017-05-24 VITALS — BP 120/90 | HR 75 | Temp 97.8°F | Wt 195.0 lb

## 2017-05-24 DIAGNOSIS — I10 Essential (primary) hypertension: Secondary | ICD-10-CM | POA: Diagnosis not present

## 2017-05-24 DIAGNOSIS — E1151 Type 2 diabetes mellitus with diabetic peripheral angiopathy without gangrene: Secondary | ICD-10-CM

## 2017-05-24 DIAGNOSIS — J42 Unspecified chronic bronchitis: Secondary | ICD-10-CM

## 2017-05-24 DIAGNOSIS — I482 Chronic atrial fibrillation, unspecified: Secondary | ICD-10-CM

## 2017-05-24 LAB — BASIC METABOLIC PANEL
BUN: 17 mg/dL (ref 6–23)
CHLORIDE: 103 meq/L (ref 96–112)
CO2: 28 meq/L (ref 19–32)
Calcium: 9.2 mg/dL (ref 8.4–10.5)
Creatinine, Ser: 1.22 mg/dL (ref 0.40–1.50)
GFR: 61.3 mL/min (ref >60.00)
Glucose, Bld: 188 mg/dL — ABNORMAL HIGH (ref 70–99)
POTASSIUM: 4.3 meq/L (ref 3.5–5.1)
Sodium: 143 mEq/L (ref 135–145)

## 2017-05-24 LAB — POCT GLYCOSYLATED HEMOGLOBIN (HGB A1C): HEMOGLOBIN A1C: 7.2

## 2017-05-24 NOTE — Patient Instructions (Signed)
Limit your sodium (Salt) intake  Pulmonary medicine follow-up as discussed   Please check your hemoglobin A1c every 3-6  months

## 2017-05-24 NOTE — Addendum Note (Signed)
Addended by: Gwenyth Ober R on: 05/24/2017 01:31 PM   Modules accepted: Orders

## 2017-05-24 NOTE — Progress Notes (Signed)
Subjective:    Patient ID: Jason Fisher, male    DOB: 04-Jul-1940, 77 y.o.   MRN: [id number]  HPI  77 year old patient who is seen today for follow-up of type 2 diabetes. Lab Results  Component Value Date   HGBA1C 7.8 02/22/2017   Hemoglobin A1c improved today to 7.2 after increase in Victoza.  Generally feels well  He has some OSA concerns.  He does have a history of advanced COPD and is followed by pulmonary medicine.  He is a loud snorer and his wife does describe some occasional apnea.  At times she states that she must stimulate the patient at night to resume breathing.  He has minimal daytime sleepiness.  He does have some oral pharyngeal crowding on exam  He has a history of chronic kidney disease.  Last creatinine clearance 49 He has essential hypertension treated with multiple drugs.  He has chronic atrial fibrillation and remains on chronic anticoagulation.   Past Medical History:  Diagnosis Date  . 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 BENIGN PROSTATIC HYPERTROPHY, HX OF 02/24/2007  . CAD S/P PCI    a. 2007 Inf STEMI s/p Vision BMS  2 to RCA;  b. 2009 ISR Prox RCA Stent-->with DES;  c. 09/2015 PCI: LM nl, LAD 30p/m, D1 90, LCX nl, OM1 small, 80, OM2 90 (2.5x14 Biofreedom stent), OM3 small, RCA 30p ISR, 34m ISR, 60m, 85d, EF 50-55%.  . 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 Chronic atrial fibrillation ([place]) 11/06/2009   a. On Eliquis (CHA2DS2VASc = 5).  Rate controlled w/ BB and CCB.  Marland Kitchen Chronic diastolic CHF (congestive heart failure) ([place])    a. 09/2015 Echo: EF 55-60%, mildly dil LA.  . CKD (chronic kidney disease), stage III ([place])    they mentioned this to the patient but they would work on it later after the aneurysm  . COLONIC POLYPS, HX OF 11/02/2006  . COPD (chronic obstructive pulmonary disease) ([place])   . DIABETES MELLITUS, TYPE II 10/29/2006  . Diverticulosis   . History of tobacco abuse    a. Quit 09/2015.  Marland Kitchen HYPERLIPIDEMIA 10/29/2006  . Hypertensive heart  disease with heart failure ([place])   . Pneumonia 09/2015   [name] 07/31/2016  . STEMI (ST elevation myocardial infarction) [facility]) 2007   [name] 07/31/2016     Social History   Socioeconomic History  . Marital status: Married    Spouse name: Not on file  . Number of children: 5  . Years of education: Not on file  . Highest education level: Not on file  Occupational History  . Occupation: [employer]  Social Needs  . Financial resource strain: Not on file  . Food insecurity:    Worry: Not on file    Inability: Not on file  . Transportation needs:    Medical: Not on file    Non-medical: Not on file  Tobacco Use  . Smoking status: Former Smoker    Packs/day: 1.00    Years: 49.00    Pack years: 49.00    Types: Cigarettes    Last attempt to quit: 09/08/2015    Years since quitting: 1.7  . Smokeless tobacco: Never Used  . Tobacco comment: passive smoker as well  Substance and Sexual Activity  . Alcohol use: No    Alcohol/week: 0.0 oz  . Drug use: No  . Sexual activity: Not on file  Lifestyle  . Physical activity:    Days per week: Not on file  Minutes per session: Not on file  . Stress: Not on file  Relationships  . Social connections:    Talks on phone: Not on file    Gets together: Not on file    Attends religious service: Not on file    Active member of club or organization: Not on file    Attends meetings of clubs or organizations: Not on file    Relationship status: Not on file  . Intimate partner violence:    Fear of current or ex partner: Not on file    Emotionally abused: Not on file    Physically abused: Not on file    Forced sexual activity: Not on file  Other Topics Concern  . Not on file  Social History Narrative  . Not on file    Past Surgical History:  Procedure Laterality Date  . ABDOMINAL AORTIC ENDOVASCULAR STENT GRAFT N/A 07/31/2016   Procedure: ABDOMINAL AORTIC ENDOVASCULAR STENT GRAFT;  Surgeon: Serafina Mitchell, MD;  Location: [facility];   Service: Vascular;  Laterality: N/A;  . CARDIAC CATHETERIZATION N/A 09/26/2015   Procedure: Left Heart Cath and Coronary Angiography;  Surgeon: Peter M Martinique, MD;  Location: [facility];  Service: Cardiovascular;  Laterality: N/A;  . CARDIAC CATHETERIZATION N/A 09/26/2015   Procedure: Coronary Stent Intervention;  Surgeon: Peter M Martinique, MD;  Location: [facility];  Service: Cardiovascular;  Laterality: N/A;  . CATARACT EXTRACTION Bilateral   . COLONOSCOPY    . CORONARY ANGIOPLASTY WITH STENT PLACEMENT  2007   Inferior STEMI 2007 - RCA PCI with 2 Vision BMS; Abnormal Myoview in 2009 - pRCA ins-stent restenosis & unstented disease - DES PCI Promus 3.0 x 20 mm (3.5 mm)  . ENDOVASCULAR REPAIR/STENT GRAFT  07/31/2016  . INCISION AND DRAINAGE PERIRECTAL ABSCESS N/A 11/06/2015   Procedure: IRRIGATION AND DEBRIDEMENT PERIRECTAL ABSCESS, POSSIBLE HEMORRHOIDECTOMY;  Surgeon: Coralie Keens, MD;  Location: [facility];  Service: General;  Laterality: N/A;  . ORIF SHOULDER FRACTURE Right    "fell out of back end of a truck"    Family History  Problem Relation Age of Onset  . Heart attack Mother   . Diabetes Mother   . Heart attack Father   . Hypertension Father   . Heart attack Brother   . Diabetes Brother     Allergies  Allergen Reactions  . Contrast Media [Iodinated Diagnostic Agents] Anaphylaxis    Current Outpatient Medications on File Prior to Visit  Medication Sig Dispense Refill  . ACCU-CHEK AVIVA PLUS test strip CHECK BLOOD SUGAR ONCE DAILY AS NEEDED 100 each 2  . clopidogrel (PLAVIX) 75 MG tablet TAKE 1 TABLET BY MOUTH EVERY DAY 90 tablet 3  . diltiazem (CARDIZEM CD) 360 MG 24 hr capsule Take 1 capsule (360 mg total) by mouth daily. 90 capsule 4  . ELIQUIS 5 MG TABS tablet TAKE 1 TABLET (5 MG TOTAL) BY MOUTH 2 (TWO) TIMES DAILY. 60 tablet 4  . furosemide (LASIX) 40 MG tablet Take 1 tablet (40 mg total) by mouth daily as needed. 90 tablet 2  . glipiZIDE (GLUCOTROL XL) 10  MG 24 hr tablet Take 1 tablet (10 mg total) by mouth daily with breakfast. 90 tablet 4  . hydrALAZINE (APRESOLINE) 25 MG tablet Take 1 tablet (25 mg total) by mouth 3 (three) times daily. 270 tablet 3  . levalbuterol (XOPENEX HFA) 45 MCG/ACT inhaler Inhale 1-2 puffs into the lungs every 6 (six) hours as needed for wheezing. 1 Inhaler 6  .  liraglutide (VICTOZA) 18 MG/3ML SOPN Inject 0.2 mLs (1.2 mg total) into the skin daily. 9 mL 1  . metFORMIN (GLUCOPHAGE) 1000 MG tablet TAKE 1 TABLET TWICE DAILY WITH A MEAL 180 tablet 2  . metoprolol (LOPRESSOR) 100 MG tablet Take 1 tablet (100 mg total) by mouth 2 (two) times daily. 180 tablet 1  . nitroGLYCERIN (NITROSTAT) 0.4 MG SL tablet Place 1 tablet (0.4 mg total) under the tongue every 5 (five) minutes as needed for chest pain. X 3 doses 25 tablet 4  . polyethylene glycol (MIRALAX / GLYCOLAX) packet Take 17 g by mouth daily as needed for mild constipation.    . Tiotropium Bromide-Olodaterol (STIOLTO RESPIMAT) 2.5-2.5 MCG/ACT AERS Inhale 2 puffs into the lungs daily. 2 Inhaler 0   No current facility-administered medications on file prior to visit.     BP 120/90 (BP Location: Left Arm, Patient Position: Sitting, Cuff Size: Large) Comment: Faint  Pulse 75   Temp 97.8 F (36.6 C) (Oral)   Wt 195 lb (88.5 kg)   SpO2 96%   BMI 29.65 kg/m     Review of Systems  Constitutional: Negative for appetite change, chills, fatigue and fever.  HENT: Negative for congestion, dental problem, ear pain, hearing loss, sore throat, tinnitus, trouble swallowing and voice change.   Eyes: Negative for pain, discharge and visual disturbance.  Respiratory: Positive for shortness of breath. Negative for cough, chest tightness, wheezing and stridor.   Cardiovascular: Negative for chest pain, palpitations and leg swelling.  Gastrointestinal: Negative for abdominal distention, abdominal pain, blood in stool, constipation, diarrhea, nausea and vomiting.  Genitourinary:  Negative for difficulty urinating, discharge, flank pain, genital sores, hematuria and urgency.  Musculoskeletal: Negative for arthralgias, back pain, gait problem, joint swelling, myalgias and neck stiffness.  Skin: Negative for rash.  Neurological: Negative for dizziness, syncope, speech difficulty, weakness, numbness and headaches.  Hematological: Negative for adenopathy. Does not bruise/bleed easily.  Psychiatric/Behavioral: Positive for sleep disturbance. Negative for behavioral problems and dysphoric mood. The patient is not nervous/anxious.        Objective:   Physical Exam  Constitutional: He is oriented to person, place, and time. He appears well-developed.  Weight 195 Blood pressure well controlled  HENT:  Head: Normocephalic.  Right Ear: External ear normal.  Left Ear: External ear normal.  Pharyngeal crowding  Eyes: Conjunctivae and EOM are normal.  Neck: Normal range of motion.  Cardiovascular: Normal rate and normal heart sounds.  Controlled ventricular response  Pulmonary/Chest: Effort normal.  A few scattered rhonchi.  Mildly diminished breath sounds  Abdominal: Bowel sounds are normal.  Musculoskeletal: Normal range of motion. He exhibits no edema or tenderness.  Neurological: He is alert and oriented to person, place, and time.  Psychiatric: He has a normal mood and affect. His behavior is normal.          Assessment & Plan:   Diabetes mellitus.  Hemoglobin A1c 7.2.  This continues to improve.  No change in medical regimen weight loss encouraged better diet recommended follow-up in 3 to 4 months Chronic atrial fibrillation.  Continue chronic anticoagulation Advanced COPD.  We will schedule pulmonary follow-up.  Patient is concerned about possible OSA Essential hypertension stable Obesity weight loss encouraged History of chronic kidney disease.  Will review renal indices.  Will need to adjust metformin therapy if creatinine clearance is less than  [place]

## 2017-06-14 ENCOUNTER — Other Ambulatory Visit: Payer: Self-pay | Admitting: Student

## 2017-06-14 ENCOUNTER — Encounter: Payer: Self-pay | Admitting: Emergency Medicine

## 2017-06-14 ENCOUNTER — Ambulatory Visit: Payer: Medicare Other | Admitting: Emergency Medicine

## 2017-06-14 DIAGNOSIS — R918 Other nonspecific abnormal finding of lung field: Secondary | ICD-10-CM | POA: Diagnosis not present

## 2017-06-14 DIAGNOSIS — J42 Unspecified chronic bronchitis: Secondary | ICD-10-CM

## 2017-06-14 MED ORDER — GLYCOPYRROLATE-FORMOTEROL 9-4.8 MCG/ACT IN AERO: 2.0000 | INHALATION_SPRAY | Freq: Two times a day (BID) | RESPIRATORY_TRACT | 0 refills | Status: DC

## 2017-06-14 MED ORDER — LEVALBUTEROL TARTRATE 45 MCG/ACT IN AERO: 1.0000 | INHALATION_SPRAY | Freq: Four times a day (QID) | RESPIRATORY_TRACT | 5 refills | Status: DC | PRN

## 2017-06-14 NOTE — Patient Instructions (Signed)
We will do a trial of Bevespi 2 puffs twice a day, every day.  If you can tell that this medicine help with you please call us.  We will order it through your pharmacy. Keep your Xopenex available to use 2 puffs up to every 4 hours as needed for shortness of breath, chest tightness, wheezing. Follow with Dr Lamonte Sakai in 1 month to discuss the [facility], about options for a longer trial or continuing it through your pharmacy.

## 2017-06-14 NOTE — Assessment & Plan Note (Signed)
Largely resolved on last CT chest from 11/2015. No repeat planned at this time.

## 2017-06-14 NOTE — Progress Notes (Signed)
Subjective:    Patient ID: Jason Fisher, male    DOB: 1940-02-06, 77 y.o.   MRN: [id number]  HPI  ROV 06/14/17 --77 year old gentleman with a history of tobacco use, coronary disease, carotid artery disease, chronic atrial fibrillation on anticoagulation, hypertension with diastolic dysfunction, AAA status post repair.  I follow him for very severe COPD.  He also has a history of pulmonary nodules by CT imaging.  These were for the most part resolved on his last scan 01/15/2016. He describes dyspnea and fatigue on climbing stairs, walking hills. He can walk 167ft. He tried stiolto for 2 months since last time. No acute exacerbations.    Review of Systems As per [facility]  Past Medical History:  Diagnosis Date  . 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 BENIGN PROSTATIC HYPERTROPHY, HX OF 02/24/2007  . CAD S/P PCI    a. 2007 Inf STEMI s/p Vision BMS  2 to RCA;  b. 2009 ISR Prox RCA Stent-->with DES;  c. 09/2015 PCI: LM nl, LAD 30p/m, D1 90, LCX nl, OM1 small, 80, OM2 90 (2.5x14 Biofreedom stent), OM3 small, RCA 30p ISR, 18m ISR, 75m, 85d, EF 50-55%.  . 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 Chronic atrial fibrillation ([place]) 11/06/2009   a. On Eliquis (CHA2DS2VASc = 5).  Rate controlled w/ BB and CCB.  Marland Kitchen Chronic diastolic CHF (congestive heart failure) ([place])    a. 09/2015 Echo: EF 55-60%, mildly dil LA.  . CKD (chronic kidney disease), stage III ([place])    they mentioned this to the patient but they would work on it later after the aneurysm  . COLONIC POLYPS, HX OF 11/02/2006  . COPD (chronic obstructive pulmonary disease) ([place])   . DIABETES MELLITUS, TYPE II 10/29/2006  . Diverticulosis   . History of tobacco abuse    a. Quit 09/2015.  Marland Kitchen HYPERLIPIDEMIA 10/29/2006  . Hypertensive heart disease with heart failure ([place])   . Pneumonia 09/2015   [name] 07/31/2016  . STEMI (ST elevation myocardial infarction) [facility]) 2007   [name] 07/31/2016     Family History  Problem Relation Age of Onset  . Heart  attack Mother   . Diabetes Mother   . Heart attack Father   . Hypertension Father   . Heart attack Brother   . Diabetes Brother      Social History   Socioeconomic History  . Marital status: Married    Spouse name: Not on file  . Number of children: 5  . Years of education: Not on file  . Highest education level: Not on file  Occupational History  . Occupation: [employer]  Social Needs  . Financial resource strain: Not on file  . Food insecurity:    Worry: Not on file    Inability: Not on file  . Transportation needs:    Medical: Not on file    Non-medical: Not on file  Tobacco Use  . Smoking status: Former Smoker    Packs/day: 1.00    Years: 49.00    Pack years: 49.00    Types: Cigarettes    Last attempt to quit: 09/08/2015    Years since quitting: 1.7  . Smokeless tobacco: Never Used  . Tobacco comment: passive smoker as well  Substance and Sexual Activity  . Alcohol use: No    Alcohol/week: 0.0 oz  . Drug use: No  . Sexual activity: Not on file  Lifestyle  . Physical activity:    Days per week: Not on file  Minutes per session: Not on file  . Stress: Not on file  Relationships  . Social connections:    Talks on phone: Not on file    Gets together: Not on file    Attends religious service: Not on file    Active member of club or organization: Not on file    Attends meetings of clubs or organizations: Not on file    Relationship status: Not on file  . Intimate partner violence:    Fear of current or ex partner: Not on file    Emotionally abused: Not on file    Physically abused: Not on file    Forced sexual activity: Not on file  Other Topics Concern  . Not on file  Social History Narrative  . 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   Allergies  Allergen Reactions  . Contrast Media [Iodinated Diagnostic Agents] Anaphylaxis     Outpatient Medications Prior to Visit  Medication Sig  Dispense Refill  . ACCU-CHEK AVIVA PLUS test strip CHECK BLOOD SUGAR ONCE DAILY AS NEEDED 100 each 2  . atorvastatin (LIPITOR) 20 MG tablet TAKE 1 TABLET BY MOUTH EVERY DAY 90 tablet 1  . clopidogrel (PLAVIX) 75 MG tablet TAKE 1 TABLET BY MOUTH EVERY DAY 90 tablet 3  . diltiazem (CARDIZEM CD) 360 MG 24 hr capsule Take 1 capsule (360 mg total) by mouth daily. 90 capsule 4  . ELIQUIS 5 MG TABS tablet TAKE 1 TABLET (5 MG TOTAL) BY MOUTH 2 (TWO) TIMES DAILY. 60 tablet 4  . furosemide (LASIX) 40 MG tablet Take 1 tablet (40 mg total) by mouth daily as needed. 90 tablet 2  . glipiZIDE (GLUCOTROL XL) 10 MG 24 hr tablet Take 1 tablet (10 mg total) by mouth daily with breakfast. 90 tablet 4  . hydrALAZINE (APRESOLINE) 25 MG tablet Take 1 tablet (25 mg total) by mouth 3 (three) times daily. 270 tablet 3  . levalbuterol (XOPENEX HFA) 45 MCG/ACT inhaler Inhale 1-2 puffs into the lungs every 6 (six) hours as needed for wheezing. 1 Inhaler 6  . liraglutide (VICTOZA) 18 MG/3ML SOPN Inject 0.2 mLs (1.2 mg total) into the skin daily. 9 mL 1  . metFORMIN (GLUCOPHAGE) 1000 MG tablet TAKE 1 TABLET TWICE DAILY WITH A MEAL 180 tablet 2  . metoprolol (LOPRESSOR) 100 MG tablet Take 1 tablet (100 mg total) by mouth 2 (two) times daily. 180 tablet 1  . nitroGLYCERIN (NITROSTAT) 0.4 MG SL tablet Place 1 tablet (0.4 mg total) under the tongue every 5 (five) minutes as needed for chest pain. X 3 doses 25 tablet 4  . polyethylene glycol (MIRALAX / GLYCOLAX) packet Take 17 g by mouth daily as needed for mild constipation.    . Tiotropium Bromide-Olodaterol (STIOLTO RESPIMAT) 2.5-2.5 MCG/ACT AERS Inhale 2 puffs into the lungs daily. 2 Inhaler 0   No facility-administered medications prior to visit.          Objective:   Physical Exam Vitals:   06/14/17 1156 06/14/17 1157  BP:  126/72  Pulse:  74  SpO2:  97%  Weight: 194 lb 6.4 oz (88.2 kg)   Height: 5\' 8"  (1.727 m)    Gen: Pleasant, well-nourished, in no distress,   normal affect  ENT: No lesions,  mouth clear,  oropharynx clear, no postnasal drip  Neck: No JVD, no TMG, no carotid bruits  Lungs: No use of accessory muscles, clear without rales  or rhonchi  Cardiovascular: RRR, heart sounds normal, no murmur or gallops, no peripheral edema  Musculoskeletal: No deformities, no cyanosis or clubbing  Neuro: alert, non focal  Skin: Warm, no lesions or rashes   11/06/15 -- CT chest  COMPARISON:  CT abdomen and pelvis including lung bases November 04, 2015; chest radiograph September 29, 2015  FINDINGS: Cardiovascular: Prominence in the ascending thoracic aorta is noted with a measured transverse diameter of 4.0 x 3.7 cm. The aorta is tortuous in the descending thoracic aorta without apparent aneurysmal dilatation. There is no thoracic aortic dissection. The visualized great vessels show moderate atherosclerotic calcification at the origins of the right common carotid and left subclavian artery. There is no appreciable pericardial thickening. There are multiple foci of coronary artery calcification. No evident pulmonary embolus.  Mediastinum/Nodes: Thyroid appears unremarkable. There are scattered subcentimeter mediastinal lymph nodes. There is a sub- carinal lymph node measuring 1.5 x 1.4 cm. There is a lymph node in the right hilum measuring 1.1 x 0.9 cm.  Lungs/Pleura: There is somewhat irregular opacity in the superior lingula medially with a nodular appearing area measuring 1.0 x 1.0 cm, best seen on axial slice 89 series 3. This finding is also apparent on sagittal slice 82 series 5. On axial slice 761 series 3 and sagittal slice 950 series 5, there is a nodular appearing opacity with subtle cavitation measuring 1.5 x 1.1 cm in the inferior lingula. There is a somewhat spiculated appearing lesion in the medial segment of the right lower lobe measuring 1.5 x 1.4 cm. This lesion is best seen on axial slice 932 series 3. There is a somewhat  irregular nodular appearing opacity in the posterior segment of the left lower lobe measuring 1.2 x 1.0 cm, best seen on axial slice 671 series 3. There is scarring in each lung base as well. There is a nodular opacity in the posterior segment right lower lobe abutting the pleura measuring 1.0 x 0.7 cm, best seen on axial slice 245 series 3. There is lower lobe bronchiectatic change bilaterally, slightly more severe on the right than on the left. There is no appreciable pleural effusion. There is mild left base atelectatic change.  Upper Abdomen: In the visualized upper abdomen, there is atherosclerotic calcification in the aorta. Incomplete visualization of abdominal aortic aneurysm noted, described on recent CT abdomen. Liver contour is somewhat lobular suggesting a degree of underlying cirrhosis. Scattered liver granulomas noted. No new lesion identified in the upper abdomen compared to 2 days prior.  Musculoskeletal: There is degenerative change in the thoracic spine. No blastic or lytic bone lesions are evident. 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  IMPRESSION: Irregular nodular opacities in the lung parenchyma as summarized above. Subtle questionable cavitation in a lesion in the inferior lingula noted. Largest nodular opacity measures 1.5 x 1.4 cm. This appearance is concerning for multifocal neoplasm. In the appropriate clinical setting, septic emboli could present similarly. There are mildly prominent sub- carinal and right hilar lymph nodes which could be either of malignant or reactive etiology. Given concern for potential multifocal neoplasm and possible lymph node involvement, nuclear medicine PET study advised to further evaluate.      Assessment & Plan:  COPD (chronic obstructive pulmonary disease) ([place]) Suspect that he remains under treated. He has not felt benefit from stiolto or anoro. I will try bevespi since he may like the fast acting effects  of formoterol. I only have 1 week sample - this may not be long enough  for him to tell if he likes it. rov 1 month to discuss.   We will do a trial of Bevespi 2 puffs twice a day, every day.  If you can tell that this medicine help with you please call us.  We will order it through your pharmacy. Keep your Xopenex available to use 2 puffs up to every 4 hours as needed for shortness of breath, chest tightness, wheezing. Follow with Dr Lamonte Sakai in 1 month to discuss the [facility], about options for a longer trial or continuing it through your pharmacy.    Pulmonary nodules Largely resolved on last CT chest from 11/2015. No repeat planned at this time.   Baltazar Apo, MD, PhD 06/14/2017, 12:30 PM [facility] [phone] or if no answer [phone]

## 2017-06-14 NOTE — Assessment & Plan Note (Signed)
Suspect that he remains under treated. He has not felt benefit from stiolto or anoro. I will try bevespi since he may like the fast acting effects of formoterol. I only have 1 week sample - this may not be long enough for him to tell if he likes it. rov 1 month to discuss.   We will do a trial of Bevespi 2 puffs twice a day, every day.  If you can tell that this medicine help with you please call us.  We will order it through your pharmacy. Keep your Xopenex available to use 2 puffs up to every 4 hours as needed for shortness of breath, chest tightness, wheezing. Follow with Dr Lamonte Sakai in 1 month to discuss the [facility], about options for a longer trial or continuing it through your pharmacy.

## 2017-06-14 NOTE — Addendum Note (Signed)
Addended by: Desmond Dike C on: 06/14/2017 12:31 PM   Modules accepted: Orders

## 2017-06-22 ENCOUNTER — Ambulatory Visit: Payer: Medicare Other | Admitting: Internal Medicine

## 2017-07-26 ENCOUNTER — Other Ambulatory Visit: Payer: Self-pay | Admitting: Internal Medicine

## 2017-07-26 ENCOUNTER — Encounter: Payer: Self-pay | Admitting: Emergency Medicine

## 2017-07-26 ENCOUNTER — Ambulatory Visit: Payer: Medicare Other | Admitting: Emergency Medicine

## 2017-07-26 DIAGNOSIS — J42 Unspecified chronic bronchitis: Secondary | ICD-10-CM

## 2017-07-26 MED ORDER — GLYCOPYRROLATE-FORMOTEROL 9-4.8 MCG/ACT IN AERO: 2.0000 | INHALATION_SPRAY | Freq: Two times a day (BID) | RESPIRATORY_TRACT | 5 refills | Status: DC

## 2017-07-26 MED ORDER — GLYCOPYRROLATE-FORMOTEROL 9-4.8 MCG/ACT IN AERO: 2.0000 | INHALATION_SPRAY | Freq: Two times a day (BID) | RESPIRATORY_TRACT | 0 refills | Status: DC

## 2017-07-26 NOTE — Addendum Note (Signed)
Addended by: Desmond Dike C on: 07/26/2017 02:49 PM   Modules accepted: Orders

## 2017-07-26 NOTE — Assessment & Plan Note (Signed)
He has benefited from the [place], better breathing, improved Xopenex usage.  We will order this to his pharmacy and plan to continue.  He needs a flu shot in the fall.  His pneumonia vaccines are up-to-date.  We will order Bevespi through your pharmacy. Take 2 puffs twice a day.  Keep your Xopenex available to use 2 puffs if needed for shortness of breath, wheezing, chest tightness. Follow with Dr Lamonte Sakai in 6 months or sooner if you have any problems

## 2017-07-26 NOTE — Addendum Note (Signed)
Addended by: Desmond Dike C on: 07/26/2017 02:39 PM   Modules accepted: Orders

## 2017-07-26 NOTE — Patient Instructions (Signed)
We will order Bevespi through your pharmacy. Take 2 puffs twice a day.  Keep your Xopenex available to use 2 puffs if needed for shortness of breath, wheezing, chest tightness. Follow with Dr Lamonte Sakai in 6 months or sooner if you have any problems

## 2017-07-26 NOTE — Progress Notes (Signed)
Subjective:    Patient ID: Jason Fisher, male    DOB: May 03, 1940, 77 y.o.   MRN: [id number]  HPI  ROV 06/14/17 --77 year old gentleman with a history of tobacco use, coronary disease, carotid artery disease, chronic atrial fibrillation on anticoagulation, hypertension with diastolic dysfunction, AAA status post repair.  I follow him for very severe COPD.  He also has a history of pulmonary nodules by CT imaging.  These were for the most part resolved on his last scan 01/15/2016. He describes dyspnea and fatigue on climbing stairs, walking hills. He can walk 131ft. He tried stiolto for 2 months since last time. No acute exacerbations.   ROV 07/26/17 --patient has a history of tobacco use and very severe COPD, pulmonary nodules by CT scan of the chest that resolved on serial imaging.  He also carries a history of coronary disease, carotid artery disease, atrial fibrillation, hypertension with diastolic dysfunction.  We have tried him in the past on Stiolto and Anoro without any benefit.  At his last visit I asked him to try bevespi to see if he prefers. He feels that it has helped him, improved his breathing. He has not needed much xopenex. Minimal cough or wheeze. He has been having some diarrhea intermittently. No flares, no pred or abx. His PNA vaccines are UTD   Review of Systems As per HPi  Past Medical History:  Diagnosis Date  . 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 BENIGN PROSTATIC HYPERTROPHY, HX OF 02/24/2007  . CAD S/P PCI    a. 2007 Inf STEMI s/p Vision BMS  2 to RCA;  b. 2009 ISR Prox RCA Stent-->with DES;  c. 09/2015 PCI: LM nl, LAD 30p/m, D1 90, LCX nl, OM1 small, 80, OM2 90 (2.5x14 Biofreedom stent), OM3 small, RCA 30p ISR, 56m ISR, 7m, 85d, EF 50-55%.  . 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 Chronic atrial fibrillation ([place]) 11/06/2009   a. On Eliquis (CHA2DS2VASc = 5).  Rate controlled w/ BB and CCB.  Marland Kitchen Chronic diastolic CHF (congestive heart failure) ([place])    a. 09/2015  Echo: EF 55-60%, mildly dil LA.  . CKD (chronic kidney disease), stage III ([place])    they mentioned this to the patient but they would work on it later after the aneurysm  . COLONIC POLYPS, HX OF 11/02/2006  . COPD (chronic obstructive pulmonary disease) ([place])   . DIABETES MELLITUS, TYPE II 10/29/2006  . Diverticulosis   . History of tobacco abuse    a. Quit 09/2015.  Marland Kitchen HYPERLIPIDEMIA 10/29/2006  . Hypertensive heart disease with heart failure ([place])   . Pneumonia 09/2015   [name] 07/31/2016  . STEMI (ST elevation myocardial infarction) [facility]) 2007   [name] 07/31/2016     Family History  Problem Relation Age of Onset  . Heart attack Mother   . Diabetes Mother   . Heart attack Father   . Hypertension Father   . Heart attack Brother   . Diabetes Brother      Social History   Socioeconomic History  . Marital status: Married    Spouse name: Not on file  . Number of children: 5  . Years of education: Not on file  . Highest education level: Not on file  Occupational History  . Occupation: [employer]  Social Needs  . Financial resource strain: Not on file  . Food insecurity:    Worry: Not on file    Inability: Not on file  . Transportation needs:  Medical: Not on file    Non-medical: Not on file  Tobacco Use  . Smoking status: Former Smoker    Packs/day: 1.00    Years: 49.00    Pack years: 49.00    Types: Cigarettes    Last attempt to quit: 09/08/2015    Years since quitting: 1.8  . Smokeless tobacco: Never Used  . Tobacco comment: passive smoker as well  Substance and Sexual Activity  . Alcohol use: No    Alcohol/week: 0.0 oz  . Drug use: No  . Sexual activity: Not on file  Lifestyle  . Physical activity:    Days per week: Not on file    Minutes per session: Not on file  . Stress: Not on file  Relationships  . Social connections:    Talks on phone: Not on file    Gets together: Not on file    Attends religious service: Not on file    Active member of club or  organization: Not on file    Attends meetings of clubs or organizations: Not on file    Relationship status: Not on file  . Intimate partner violence:    Fear of current or ex partner: Not on file    Emotionally abused: Not on file    Physically abused: Not on file    Forced sexual activity: Not on file  Other Topics Concern  . Not on file  Social History Narrative  . 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   Allergies  Allergen Reactions  . Contrast Media [Iodinated Diagnostic Agents] Anaphylaxis     Outpatient Medications Prior to Visit  Medication Sig Dispense Refill  . ACCU-CHEK AVIVA PLUS test strip CHECK BLOOD SUGAR ONCE DAILY AS NEEDED 100 each 2  . atorvastatin (LIPITOR) 20 MG tablet TAKE 1 TABLET BY MOUTH EVERY DAY 90 tablet 1  . clopidogrel (PLAVIX) 75 MG tablet TAKE 1 TABLET BY MOUTH EVERY DAY 90 tablet 3  . diltiazem (CARDIZEM CD) 360 MG 24 hr capsule Take 1 capsule (360 mg total) by mouth daily. 90 capsule 4  . diltiazem (CARDIZEM CD) 360 MG 24 hr capsule TAKE 1 CAPSULE (360 MG TOTAL) BY MOUTH DAILY. 90 capsule 3  . ELIQUIS 5 MG TABS tablet TAKE 1 TABLET (5 MG TOTAL) BY MOUTH 2 (TWO) TIMES DAILY. 60 tablet 4  . furosemide (LASIX) 40 MG tablet Take 1 tablet (40 mg total) by mouth daily as needed. 90 tablet 2  . glipiZIDE (GLUCOTROL XL) 10 MG 24 hr tablet Take 1 tablet (10 mg total) by mouth daily with breakfast. 90 tablet 4  . Glycopyrrolate-Formoterol (BEVESPI AEROSPHERE) 9-4.8 MCG/ACT AERO Inhale 2 puffs into the lungs 2 (two) times daily. 1 Inhaler 0  . hydrALAZINE (APRESOLINE) 25 MG tablet Take 1 tablet (25 mg total) by mouth 3 (three) times daily. 270 tablet 3  . levalbuterol (XOPENEX HFA) 45 MCG/ACT inhaler Inhale 1-2 puffs into the lungs every 6 (six) hours as needed for wheezing. 1 Inhaler 5  . liraglutide (VICTOZA) 18 MG/3ML SOPN Inject 0.2 mLs (1.2 mg total) into the skin daily. 9 mL 1  . metFORMIN  (GLUCOPHAGE) 1000 MG tablet TAKE 1 TABLET TWICE DAILY WITH A MEAL 180 tablet 2  . metoprolol (LOPRESSOR) 100 MG tablet Take 1 tablet (100 mg total) by mouth 2 (two) times daily. 180 tablet 1  . nitroGLYCERIN (NITROSTAT) 0.4 MG SL tablet Place 1 tablet (0.4 mg  total) under the tongue every 5 (five) minutes as needed for chest pain. X 3 doses 25 tablet 4  . polyethylene glycol (MIRALAX / GLYCOLAX) packet Take 17 g by mouth daily as needed for mild constipation.    . Tiotropium Bromide-Olodaterol (STIOLTO RESPIMAT) 2.5-2.5 MCG/ACT AERS Inhale 2 puffs into the lungs daily. 2 Inhaler 0   No facility-administered medications prior to visit.          Objective:   Physical Exam Vitals:   07/26/17 1415 07/26/17 1416  BP:  90/62  Pulse:  (!) 50  SpO2:  96%  Weight: 196 lb (88.9 kg)   Height: 5\' 8"  (1.727 m)    Gen: Pleasant, well-nourished, in no distress,  normal affect  ENT: No lesions,  mouth clear,  oropharynx clear, no postnasal drip  Neck: No JVD, no stridor  Lungs: No use of accessory muscles, clear without rales or rhonchi  Cardiovascular: RRR, heart sounds normal, no murmur or gallops, no peripheral edema  Musculoskeletal: No deformities, no cyanosis or clubbing  Neuro: alert, non focal  Skin: Warm, no lesions or rashes   11/06/15 -- CT chest  COMPARISON:  CT abdomen and pelvis including lung bases November 04, 2015; chest radiograph September 29, 2015  FINDINGS: Cardiovascular: Prominence in the ascending thoracic aorta is noted with a measured transverse diameter of 4.0 x 3.7 cm. The aorta is tortuous in the descending thoracic aorta without apparent aneurysmal dilatation. There is no thoracic aortic dissection. The visualized great vessels show moderate atherosclerotic calcification at the origins of the right common carotid and left subclavian artery. There is no appreciable pericardial thickening. There are multiple foci of coronary artery calcification. No evident  pulmonary embolus.  Mediastinum/Nodes: Thyroid appears unremarkable. There are scattered subcentimeter mediastinal lymph nodes. There is a sub- carinal lymph node measuring 1.5 x 1.4 cm. There is a lymph node in the right hilum measuring 1.1 x 0.9 cm.  Lungs/Pleura: There is somewhat irregular opacity in the superior lingula medially with a nodular appearing area measuring 1.0 x 1.0 cm, best seen on axial slice 89 series 3. This finding is also apparent on sagittal slice 82 series 5. On axial slice 295 series 3 and sagittal slice 284 series 5, there is a nodular appearing opacity with subtle cavitation measuring 1.5 x 1.1 cm in the inferior lingula. There is a somewhat spiculated appearing lesion in the medial segment of the right lower lobe measuring 1.5 x 1.4 cm. This lesion is best seen on axial slice 132 series 3. There is a somewhat irregular nodular appearing opacity in the posterior segment of the left lower lobe measuring 1.2 x 1.0 cm, best seen on axial slice 440 series 3. There is scarring in each lung base as well. There is a nodular opacity in the posterior segment right lower lobe abutting the pleura measuring 1.0 x 0.7 cm, best seen on axial slice 102 series 3. There is lower lobe bronchiectatic change bilaterally, slightly more severe on the right than on the left. There is no appreciable pleural effusion. There is mild left base atelectatic change.  Upper Abdomen: In the visualized upper abdomen, there is atherosclerotic calcification in the aorta. Incomplete visualization of abdominal aortic aneurysm noted, described on recent CT abdomen. Liver contour is somewhat lobular suggesting a degree of underlying cirrhosis. Scattered liver granulomas noted. No new lesion identified in the upper abdomen compared to 2 days prior.  Musculoskeletal: There is degenerative change in the thoracic spine. No blastic or lytic  bone lesions are evident. There is  diffuse idiopathic skeletal hyperostosis.  IMPRESSION: Irregular nodular opacities in the lung parenchyma as summarized above. Subtle questionable cavitation in a lesion in the inferior lingula noted. Largest nodular opacity measures 1.5 x 1.4 cm. This appearance is concerning for multifocal neoplasm. In the appropriate clinical setting, septic emboli could present similarly. There are mildly prominent sub- carinal and right hilar lymph nodes which could be either of malignant or reactive etiology. Given concern for potential multifocal neoplasm and possible lymph node involvement, nuclear medicine PET study advised to further evaluate.      Assessment & Plan:  COPD (chronic obstructive pulmonary disease) ([place]) He has benefited from the [place], better breathing, improved Xopenex usage.  We will order this to his pharmacy and plan to continue.  He needs a flu shot in the fall.  His pneumonia vaccines are up-to-date.  We will order Bevespi through your pharmacy. Take 2 puffs twice a day.  Keep your Xopenex available to use 2 puffs if needed for shortness of breath, wheezing, chest tightness. Follow with Dr Lamonte Sakai in 6 months or sooner if you have any problems  Baltazar Apo, MD, PhD 07/26/2017, 2:35 PM [facility] [phone] or if no answer [phone]

## 2017-07-28 NOTE — Telephone Encounter (Signed)
Sent to the pharmacy by e-scribe for 90 days.  Last A1C 05/24/17.  Due back for follow up A1C 08/24/17.

## 2017-08-18 ENCOUNTER — Other Ambulatory Visit: Payer: Self-pay | Admitting: Internal Medicine

## 2017-08-25 ENCOUNTER — Other Ambulatory Visit: Payer: Self-pay

## 2017-08-25 DIAGNOSIS — I714 Abdominal aortic aneurysm, without rupture, unspecified: Secondary | ICD-10-CM

## 2017-08-25 DIAGNOSIS — Z95828 Presence of other vascular implants and grafts: Secondary | ICD-10-CM

## 2017-09-07 ENCOUNTER — Other Ambulatory Visit: Payer: Self-pay

## 2017-09-07 ENCOUNTER — Telehealth: Payer: Self-pay | Admitting: *Deleted

## 2017-09-07 NOTE — Patient Outreach (Signed)
[facility]) Care Management  09/07/2017  Jason Fisher [birth year] [id number]   Medication Adherence call to Mr. Jason Fisher left a message for patient to call back patient is due on Atorvastatin 20 mg and Victoza. [name] is showing past due under [place].   [place] Management Direct Dial [phone]  Fax [phone] [email or web address]

## 2017-09-07 NOTE — Telephone Encounter (Signed)
Left message for patient to call research office for 24 month telephone follow up.

## 2017-09-12 ENCOUNTER — Other Ambulatory Visit (HOSPITAL_COMMUNITY): Payer: Medicare Other

## 2017-09-13 ENCOUNTER — Ambulatory Visit (HOSPITAL_COMMUNITY): Admission: RE | Admit: 2017-09-13 | Payer: Medicare Other | Source: Ambulatory Visit

## 2017-09-13 ENCOUNTER — Encounter: Payer: Self-pay | Admitting: Surgery

## 2017-09-13 ENCOUNTER — Ambulatory Visit: Payer: Medicare Other | Admitting: Surgery

## 2017-09-14 ENCOUNTER — Telehealth: Payer: Self-pay | Admitting: Surgery

## 2017-09-14 NOTE — Telephone Encounter (Signed)
Called son Codey Burling  and advised pt did not come to his ultra sound appt on 09/13/17.  I had to cancel his appt for next Monday 09/20/17 with Dr. Trula Slade.  Advised the ultrasound has to be done first. He said he will call his dad and let him know.

## 2017-09-20 ENCOUNTER — Ambulatory Visit: Payer: Medicare Other | Admitting: Surgery

## 2017-09-20 ENCOUNTER — Encounter (INDEPENDENT_AMBULATORY_CARE_PROVIDER_SITE_OTHER): Payer: Medicare Other | Admitting: Ophthalmology

## 2017-09-23 ENCOUNTER — Encounter: Payer: Self-pay | Admitting: Internal Medicine

## 2017-09-23 ENCOUNTER — Ambulatory Visit (INDEPENDENT_AMBULATORY_CARE_PROVIDER_SITE_OTHER): Payer: Medicare Other | Admitting: Internal Medicine

## 2017-09-23 VITALS — BP 155/88 | HR 68 | Temp 97.6°F | Wt 194.8 lb

## 2017-09-23 DIAGNOSIS — I482 Chronic atrial fibrillation, unspecified: Secondary | ICD-10-CM

## 2017-09-23 DIAGNOSIS — I10 Essential (primary) hypertension: Secondary | ICD-10-CM

## 2017-09-23 DIAGNOSIS — E1151 Type 2 diabetes mellitus with diabetic peripheral angiopathy without gangrene: Secondary | ICD-10-CM

## 2017-09-23 LAB — POCT GLYCOSYLATED HEMOGLOBIN (HGB A1C): HEMOGLOBIN A1C: 7.6 % — AB (ref 4.0–5.6)

## 2017-09-23 MED ORDER — LIRAGLUTIDE 18 MG/3ML ~~LOC~~ SOPN: 1.8000 mg | PEN_INJECTOR | Freq: Every day | SUBCUTANEOUS | 1 refills | Status: DC

## 2017-09-23 MED ORDER — CLOPIDOGREL BISULFATE 75 MG PO TABS: 75.0000 mg | ORAL_TABLET | Freq: Every day | ORAL | 3 refills | Status: DC

## 2017-09-23 MED ORDER — DILTIAZEM HCL ER COATED BEADS 360 MG PO CP24: 360.0000 mg | ORAL_CAPSULE | Freq: Every day | ORAL | 4 refills | Status: DC

## 2017-09-23 MED ORDER — METFORMIN HCL 1000 MG PO TABS: ORAL_TABLET | ORAL | 4 refills | Status: DC

## 2017-09-23 MED ORDER — METOPROLOL TARTRATE 100 MG PO TABS: 100.0000 mg | ORAL_TABLET | Freq: Two times a day (BID) | ORAL | 4 refills | Status: DC

## 2017-09-23 MED ORDER — TIOTROPIUM BROMIDE-OLODATEROL 2.5-2.5 MCG/ACT IN AERS: 2.0000 | INHALATION_SPRAY | Freq: Every day | RESPIRATORY_TRACT | 4 refills | Status: DC

## 2017-09-23 MED ORDER — FUROSEMIDE 40 MG PO TABS: 40.0000 mg | ORAL_TABLET | Freq: Every day | ORAL | 2 refills | Status: DC | PRN

## 2017-09-23 MED ORDER — APIXABAN 5 MG PO TABS: 5.0000 mg | ORAL_TABLET | Freq: Two times a day (BID) | ORAL | 4 refills | Status: DC

## 2017-09-23 MED ORDER — LEVALBUTEROL TARTRATE 45 MCG/ACT IN AERO: 1.0000 | INHALATION_SPRAY | Freq: Four times a day (QID) | RESPIRATORY_TRACT | 5 refills | Status: DC | PRN

## 2017-09-23 MED ORDER — HYDRALAZINE HCL 25 MG PO TABS: 25.0000 mg | ORAL_TABLET | Freq: Three times a day (TID) | ORAL | 3 refills | Status: DC

## 2017-09-23 MED ORDER — GLIPIZIDE ER 10 MG PO TB24: 10.0000 mg | ORAL_TABLET | Freq: Every day | ORAL | 4 refills | Status: DC

## 2017-09-23 MED ORDER — ATORVASTATIN CALCIUM 20 MG PO TABS: 20.0000 mg | ORAL_TABLET | Freq: Every day | ORAL | 4 refills | Status: DC

## 2017-09-23 NOTE — Patient Instructions (Signed)
Limit your sodium (Salt) intake   Please check your hemoglobin A1c every 3 months  Please check your blood pressure on a regular basis.  If it is consistently greater than 150/90, please make an office appointment.   

## 2017-09-23 NOTE — Progress Notes (Signed)
Subjective:    Patient ID: Jason Fisher, male    DOB: 10-11-1940, 77 y.o.   MRN: [id number]  HPI  BP Readings from Last 3 Encounters:  07/26/17 90/62  06/14/17 126/72  05/24/17 120/90   Wt Readings from Last 3 Encounters:  09/23/17 194 lb 12.8 oz (88.4 kg)  07/26/17 196 lb (88.9 kg)  06/14/17 194 lb 6.4 oz (28.87 kg)   77 year old patient who has a history of type 2 diabetes.  He is seen today for follow-up.  He has essential hypertension and chronic atrial fibrillation.  Remains on anticoagulation.  Remains on big toes at a dose of 1.2 mg daily.  He is on glipizide as well. He remains on full dose metformin.  Last GFR was greater than 60  He does have a history of chronic kidney disease.  He generally feels well  Past Medical History:  Diagnosis Date  . 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 BENIGN PROSTATIC HYPERTROPHY, HX OF 02/24/2007  . CAD S/P PCI    a. 2007 Inf STEMI s/p Vision BMS  2 to RCA;  b. 2009 ISR Prox RCA Stent-->with DES;  c. 09/2015 PCI: LM nl, LAD 30p/m, D1 90, LCX nl, OM1 small, 80, OM2 90 (2.5x14 Biofreedom stent), OM3 small, RCA 30p ISR, 87m ISR, 45m, 85d, EF 50-55%.  . 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 Chronic atrial fibrillation ([place]) 11/06/2009   a. On Eliquis (CHA2DS2VASc = 5).  Rate controlled w/ BB and CCB.  Marland Kitchen Chronic diastolic CHF (congestive heart failure) ([place])    a. 09/2015 Echo: EF 55-60%, mildly dil LA.  . CKD (chronic kidney disease), stage III ([place])    they mentioned this to the patient but they would work on it later after the aneurysm  . COLONIC POLYPS, HX OF 11/02/2006  . COPD (chronic obstructive pulmonary disease) ([place])   . DIABETES MELLITUS, TYPE II 10/29/2006  . Diverticulosis   . History of tobacco abuse    a. Quit 09/2015.  Marland Kitchen HYPERLIPIDEMIA 10/29/2006  . Hypertensive heart disease with heart failure ([place])   . Pneumonia 09/2015   [name] 07/31/2016  . STEMI (ST elevation myocardial infarction) [facility]) 2007   [name] 07/31/2016      Social History   Socioeconomic History  . Marital status: Married    Spouse name: Not on file  . Number of children: 5  . Years of education: Not on file  . Highest education level: Not on file  Occupational History  . Occupation: [employer]  Social Needs  . Financial resource strain: Not on file  . Food insecurity:    Worry: Not on file    Inability: Not on file  . Transportation needs:    Medical: Not on file    Non-medical: Not on file  Tobacco Use  . Smoking status: Former Smoker    Packs/day: 1.00    Years: 49.00    Pack years: 49.00    Types: Cigarettes    Last attempt to quit: 09/08/2015    Years since quitting: 2.0  . Smokeless tobacco: Never Used  . Tobacco comment: passive smoker as well  Substance and Sexual Activity  . Alcohol use: No    Alcohol/week: 0.0 standard drinks  . Drug use: No  . Sexual activity: Not on file  Lifestyle  . Physical activity:    Days per week: Not on file    Minutes per session: Not on file  . Stress: Not on file  Relationships  . Social connections:    Talks on phone: Not on file    Gets together: Not on file    Attends religious service: Not on file    Active member of club or organization: Not on file    Attends meetings of clubs or organizations: Not on file    Relationship status: Not on file  . Intimate partner violence:    Fear of current or ex partner: Not on file    Emotionally abused: Not on file    Physically abused: Not on file    Forced sexual activity: Not on file  Other Topics Concern  . Not on file  Social History Narrative  . Not on file    Past Surgical History:  Procedure Laterality Date  . ABDOMINAL AORTIC ENDOVASCULAR STENT GRAFT N/A 07/31/2016   Procedure: ABDOMINAL AORTIC ENDOVASCULAR STENT GRAFT;  Surgeon: Serafina Mitchell, MD;  Location: [place];  Service: Vascular;  Laterality: N/A;  . CARDIAC CATHETERIZATION N/A 09/26/2015   Procedure: Left Heart Cath and Coronary Angiography;  Surgeon:  Delvonte Berenson M [place], MD;  Location: [place] CV LAB;  Service: Cardiovascular;  Laterality: N/A;  . CARDIAC CATHETERIZATION N/A 09/26/2015   Procedure: Coronary Stent Intervention;  Surgeon: Evolet Salminen M [place], MD;  Location: [place] CV LAB;  Service: Cardiovascular;  Laterality: N/A;  . CATARACT EXTRACTION Bilateral   . COLONOSCOPY    . CORONARY ANGIOPLASTY WITH STENT PLACEMENT  2007   Inferior STEMI 2007 - RCA PCI with 2 Vision BMS; Abnormal Myoview in 2009 - pRCA ins-stent restenosis & unstented disease - DES PCI Promus 3.0 x 20 mm (3.5 mm)  . ENDOVASCULAR REPAIR/STENT GRAFT  07/31/2016  . INCISION AND DRAINAGE PERIRECTAL ABSCESS N/A 11/06/2015   Procedure: IRRIGATION AND DEBRIDEMENT PERIRECTAL ABSCESS, POSSIBLE HEMORRHOIDECTOMY;  Surgeon: Coralie Keens, MD;  Location: [place];  Service: General;  Laterality: N/A;  . ORIF SHOULDER FRACTURE Right    "fell out of back end of a truck"    Family History  Problem Relation Age of Onset  . Heart attack Mother   . Diabetes Mother   . Heart attack Father   . Hypertension Father   . Heart attack Brother   . Diabetes Brother     Allergies  Allergen Reactions  . Contrast Media [Iodinated Diagnostic Agents] Anaphylaxis    Current Outpatient Medications on File Prior to Visit  Medication Sig Dispense Refill  . ACCU-CHEK AVIVA PLUS test strip CHECK BLOOD SUGAR ONCE DAILY AS NEEDED 100 each 2  . atorvastatin (LIPITOR) 20 MG tablet TAKE 1 TABLET BY MOUTH EVERY DAY 90 tablet 1  . clopidogrel (PLAVIX) 75 MG tablet TAKE 1 TABLET BY MOUTH EVERY DAY 90 tablet 3  . diltiazem (CARDIZEM CD) 360 MG 24 hr capsule Take 1 capsule (360 mg total) by mouth daily. 90 capsule 4  . ELIQUIS 5 MG TABS tablet TAKE 1 TABLET (5 MG TOTAL) BY MOUTH 2 (TWO) TIMES DAILY. 60 tablet 4  . furosemide (LASIX) 40 MG tablet Take 1 tablet (40 mg total) by mouth daily as needed. 90 tablet 2  . glipiZIDE (GLUCOTROL XL) 10 MG 24 hr tablet Take 1 tablet (10 mg total) by mouth daily  with breakfast. 90 tablet 4  . Glycopyrrolate-Formoterol (BEVESPI AEROSPHERE) 9-4.8 MCG/ACT AERO Inhale 2 puffs into the lungs 2 (two) times daily. 1 Inhaler 5  . levalbuterol (XOPENEX HFA) 45 MCG/ACT inhaler Inhale 1-2 puffs into the lungs every 6 (six) hours as needed for wheezing.  1 Inhaler 5  . metFORMIN (GLUCOPHAGE) 1000 MG tablet TAKE 1 TABLET TWICE DAILY WITH A MEAL 180 tablet 0  . metoprolol tartrate (LOPRESSOR) 100 MG tablet TAKE 1 TABLET BY MOUTH TWICE A DAY 180 tablet 0  . nitroGLYCERIN (NITROSTAT) 0.4 MG SL tablet Place 1 tablet (0.4 mg total) under the tongue every 5 (five) minutes as needed for chest pain. X 3 doses 25 tablet 4  . polyethylene glycol (MIRALAX / GLYCOLAX) packet Take 17 g by mouth daily as needed for mild constipation.    . Tiotropium Bromide-Olodaterol (STIOLTO RESPIMAT) 2.5-2.5 MCG/ACT AERS Inhale 2 puffs into the lungs daily. 2 Inhaler 0  . hydrALAZINE (APRESOLINE) 25 MG tablet Take 1 tablet (25 mg total) by mouth 3 (three) times daily. 270 tablet 3   No current facility-administered medications on file prior to visit.     BP (!) 155/88   Pulse 68   Temp 97.6 F (36.4 C) (Oral)   Wt 194 lb 12.8 oz (88.4 kg)   SpO2 97%   BMI 29.62 kg/m     Review of Systems  Constitutional: Negative for appetite change, chills, fatigue and fever.  HENT: Negative for congestion, dental problem, ear pain, hearing loss, sore throat, tinnitus, trouble swallowing and voice change.   Eyes: Negative for pain, discharge and visual disturbance.  Respiratory: Negative for cough, chest tightness, wheezing and stridor.   Cardiovascular: Negative for chest pain, palpitations and leg swelling.  Gastrointestinal: Negative for abdominal distention, abdominal pain, blood in stool, constipation, diarrhea, nausea and vomiting.  Genitourinary: Negative for difficulty urinating, discharge, flank pain, genital sores, hematuria and urgency.  Musculoskeletal: Negative for arthralgias, back  pain, gait problem, joint swelling, myalgias and neck stiffness.  Skin: Negative for rash.  Neurological: Negative for dizziness, syncope, speech difficulty, weakness, numbness and headaches.  Hematological: Negative for adenopathy. Does not bruise/bleed easily.  Psychiatric/Behavioral: Negative for behavioral problems and dysphoric mood. The patient is not nervous/anxious.        Objective:   Physical Exam  Constitutional: He is oriented to person, place, and time. He appears well-developed.  Blood pressure 155/88  HENT:  Head: Normocephalic.  Right Ear: External ear normal.  Left Ear: External ear normal.  Eyes: Conjunctivae and EOM are normal.  Neck: Normal range of motion.  Cardiovascular: Normal rate and normal heart sounds.  Pulmonary/Chest: Breath sounds normal.  Abdominal: Bowel sounds are normal.  Musculoskeletal: Normal range of motion. He exhibits no edema or tenderness.  Neurological: He is alert and oriented to person, place, and time.  Psychiatric: He has a normal mood and affect. His behavior is normal.          Assessment & Plan:   Diabetes mellitus.  Will increase Victoza to 1.8 mg daily.  Follow-up 3 months weight loss encouraged Essential hypertension.  Continue triple therapy.  Reassess in 3 months low-salt diet recommended Chronic atrial fibrillation.  Continue anticoagulation  Follow-up 3 months Medications updated  Marletta Lor

## 2017-09-24 ENCOUNTER — Telehealth: Payer: Self-pay | Admitting: *Deleted

## 2017-09-24 NOTE — Telephone Encounter (Signed)
Left message for patient to call research office for [place]

## 2017-10-15 ENCOUNTER — Ambulatory Visit: Payer: Self-pay

## 2017-10-15 NOTE — Telephone Encounter (Signed)
Patient's wife called in with c/o "feet swelling." She says "they started swelling 1 week ago to his feet and up to his ankles. I was wondering if the Lasix isn't working anymore. He takes 1 a day and goes to the bathroom all the time, but his feet are not going down. He has a history of heart failure and I didn't know if this is the problem." I asked is the swelling to his lower legs, she looked and says "no." I asked is there pain to the feet or ankles, if they are red, look infected, she asks him about pain and says "no to pain and redness." She says "no." I asked about chest pain, difficulty breathing, he denies. According to protocol, see PCP within 2 weeks, appointment scheduled for Tuesday, 10/19/17 at 1330 with Dr. Burnice Logan, care advice given, patient's wife verbalized understanding.   Reason for Disposition . [1] MILD swelling of both ankles (i.e., pedal edema) AND [2] varicose veins  Answer Assessment - Initial Assessment Questions 1. ONSET: "When did the swelling start?" (e.g., minutes, hours, days)     1 week ago 2. LOCATION: "What part of the leg is swollen?"  "Are both legs swollen or just one leg?"     Just feet and ankles 3. SEVERITY: "How bad is the swelling?" (e.g., localized; mild, moderate, severe)  - Localized - small area of swelling localized to one leg  - MILD pedal edema - swelling limited to foot and ankle, pitting edema < 1/4 inch (6 mm) deep, rest and elevation eliminate most or all swelling  - MODERATE edema - swelling of lower leg to knee, pitting edema > 1/4 inch (6 mm) deep, rest and elevation only partially reduce swelling  - SEVERE edema - swelling extends above knee, facial or hand swelling present      Mild 4. REDNESS: "Does the swelling look red or infected?"     No 5. PAIN: "Is the swelling painful to touch?" If so, ask: "How painful is it?"   (Scale 1-10; mild, moderate or severe)    No 6. FEVER: "Do you have a fever?" If so, ask: "What is it, how was it  measured, and when did it start?"      No 7. CAUSE: "What do you think is causing the leg swelling?"    I don't know 8. MEDICAL HISTORY: "Do you have a history of heart failure, kidney disease, liver failure, or cancer?"     Heart failure 9. RECURRENT SYMPTOM: "Have you had leg swelling before?" If so, ask: "When was the last time?" "What happened that time?"     Yes, a few years ago 10. OTHER SYMPTOMS: "Do you have any other symptoms?" (e.g., chest pain, difficulty breathing)       No 11. PREGNANCY: "Is there any chance you are pregnant?" "When was your last menstrual period?"       N/A  Protocols used: LEG SWELLING AND EDEMA-A-AH

## 2017-10-18 ENCOUNTER — Encounter: Payer: Self-pay | Admitting: Surgery

## 2017-10-18 ENCOUNTER — Ambulatory Visit (HOSPITAL_COMMUNITY)
Admission: RE | Admit: 2017-10-18 | Discharge: 2017-10-18 | Disposition: A | Discharge status: Home / Self Care | Payer: Medicare Other | Source: Ambulatory Visit | Attending: Surgery | Admitting: Surgery

## 2017-10-18 ENCOUNTER — Ambulatory Visit (INDEPENDENT_AMBULATORY_CARE_PROVIDER_SITE_OTHER): Payer: Medicare Other | Admitting: Surgery

## 2017-10-18 ENCOUNTER — Other Ambulatory Visit: Payer: Self-pay

## 2017-10-18 VITALS — BP 120/75 | HR 55 | Temp 97.2°F | Resp 18 | Ht 70.0 in | Wt 195.0 lb

## 2017-10-18 DIAGNOSIS — Z48812 Encounter for surgical aftercare following surgery on the circulatory system: Secondary | ICD-10-CM | POA: Diagnosis not present

## 2017-10-18 DIAGNOSIS — N183 Chronic kidney disease, stage 3 (moderate): Secondary | ICD-10-CM | POA: Diagnosis not present

## 2017-10-18 DIAGNOSIS — I714 Abdominal aortic aneurysm, without rupture, unspecified: Secondary | ICD-10-CM

## 2017-10-18 DIAGNOSIS — E1122 Type 2 diabetes mellitus with diabetic chronic kidney disease: Secondary | ICD-10-CM | POA: Insufficient documentation

## 2017-10-18 DIAGNOSIS — Z95828 Presence of other vascular implants and grafts: Secondary | ICD-10-CM | POA: Insufficient documentation

## 2017-10-18 DIAGNOSIS — I129 Hypertensive chronic kidney disease with stage 1 through stage 4 chronic kidney disease, or unspecified chronic kidney disease: Secondary | ICD-10-CM | POA: Diagnosis not present

## 2017-10-18 DIAGNOSIS — E669 Obesity, unspecified: Secondary | ICD-10-CM | POA: Diagnosis not present

## 2017-10-18 NOTE — Progress Notes (Signed)
Vascular and Vein Specialist of [facility]  Patient name: Jason Fisher MRN: [id number] DOB: 04/06/1940 Sex: male   REASON FOR VISIT:    Follow-up  HISOTRY OF PRESENT ILLNESS:    Jason Fisher is a 77 y.o. male who is status post endovascular abdominal aortic aneurysm repair on July 31, 2016.  His postoperative CT scan showed a type II endoleak which resolved on subsequent imaging.  He has been having bilateral leg swelling for 2 weeks.  He is on Eliquis for afib.  He also takes Plavix.  His is on a statin daily for cholesterol management.  He is on oral agents for his diabetes.  He takes a CCB and beta blocker for blood pressure management.   He quit smoking a couple of years ago.  He suffers from coronary artery disease, having undergone stenting in the past.  He also has chronic diastolic congestive heart failure.  PAST MEDICAL HISTORY:   Past Medical History:  Diagnosis Date  . 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 BENIGN PROSTATIC HYPERTROPHY, HX OF 02/24/2007  . CAD S/P PCI    a. 2007 Inf STEMI s/p Vision BMS  2 to RCA;  b. 2009 ISR Prox RCA Stent-->with DES;  c. 09/2015 PCI: LM nl, LAD 30p/m, D1 90, LCX nl, OM1 small, 80, OM2 90 (2.5x14 Biofreedom stent), OM3 small, RCA 30p ISR, 32m ISR, 107m, 85d, EF 50-55%.  . 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 Chronic atrial fibrillation ([place]) 11/06/2009   a. On Eliquis (CHA2DS2VASc = 5).  Rate controlled w/ BB and CCB.  Marland Kitchen Chronic diastolic CHF (congestive heart failure) ([place])    a. 09/2015 Echo: EF 55-60%, mildly dil LA.  . CKD (chronic kidney disease), stage III ([place])    they mentioned this to the patient but they would work on it later after the aneurysm  . COLONIC POLYPS, HX OF 11/02/2006  . COPD (chronic obstructive pulmonary disease) ([place])   . DIABETES MELLITUS, TYPE II 10/29/2006  . Diverticulosis   . History of tobacco abuse    a. Quit 09/2015.  Marland Kitchen HYPERLIPIDEMIA 10/29/2006  . Hypertensive  heart disease with heart failure ([place])   . Pneumonia 09/2015   [name] 07/31/2016  . STEMI (ST elevation myocardial infarction) [facility]) 2007   /notes 07/31/2016     FAMILY HISTORY:   Family History  Problem Relation Age of Onset  . Heart attack Mother   . Diabetes Mother   . Heart attack Father   . Hypertension Father   . Heart attack Brother   . Diabetes Brother     SOCIAL HISTORY:   Social History   Tobacco Use  . Smoking status: Former Smoker    Packs/day: 1.00    Years: 49.00    Pack years: 49.00    Types: Cigarettes    Last attempt to quit: 09/08/2015    Years since quitting: 2.1  . Smokeless tobacco: Never Used  . Tobacco comment: passive smoker as well  Substance Use Topics  . Alcohol use: No    Alcohol/week: 0.0 standard drinks     ALLERGIES:   Allergies  Allergen Reactions  . Contrast Media [Iodinated Diagnostic Agents] Anaphylaxis     CURRENT MEDICATIONS:   Current Outpatient Medications  Medication Sig Dispense Refill  . ACCU-CHEK AVIVA PLUS test strip CHECK BLOOD SUGAR ONCE DAILY AS NEEDED 100 each 2  . apixaban (ELIQUIS) 5 MG TABS tablet Take 1 tablet (5 mg total) by mouth 2 (two) times  daily. 60 tablet 4  . atorvastatin (LIPITOR) 20 MG tablet Take 1 tablet (20 mg total) by mouth daily. 90 tablet 4  . clopidogrel (PLAVIX) 75 MG tablet Take 1 tablet (75 mg total) by mouth daily. 90 tablet 3  . diltiazem (CARDIZEM CD) 360 MG 24 hr capsule Take 1 capsule (360 mg total) by mouth daily. 90 capsule 4  . furosemide (LASIX) 40 MG tablet Take 1 tablet (40 mg total) by mouth daily as needed. 90 tablet 2  . glipiZIDE (GLUCOTROL XL) 10 MG 24 hr tablet Take 1 tablet (10 mg total) by mouth daily with breakfast. 90 tablet 4  . Glycopyrrolate-Formoterol (BEVESPI AEROSPHERE) 9-4.8 MCG/ACT AERO Inhale 2 puffs into the lungs 2 (two) times daily. 1 Inhaler 5  . hydrALAZINE (APRESOLINE) 25 MG tablet Take 1 tablet (25 mg total) by mouth 3 (three) times daily. 270 tablet 3    . levalbuterol (XOPENEX HFA) 45 MCG/ACT inhaler Inhale 1-2 puffs into the lungs every 6 (six) hours as needed for wheezing. 1 Inhaler 5  . liraglutide (VICTOZA) 18 MG/3ML SOPN Inject 0.3 mLs (1.8 mg total) into the skin daily. 10 pen 1  . metFORMIN (GLUCOPHAGE) 1000 MG tablet TAKE 1 TABLET TWICE DAILY WITH A MEAL 180 tablet 4  . metoprolol tartrate (LOPRESSOR) 100 MG tablet Take 1 tablet (100 mg total) by mouth 2 (two) times daily. 180 tablet 4  . nitroGLYCERIN (NITROSTAT) 0.4 MG SL tablet Place 1 tablet (0.4 mg total) under the tongue every 5 (five) minutes as needed for chest pain. X 3 doses 25 tablet 4  . polyethylene glycol (MIRALAX / GLYCOLAX) packet Take 17 g by mouth daily as needed for mild constipation.    . Tiotropium Bromide-Olodaterol (STIOLTO RESPIMAT) 2.5-2.5 MCG/ACT AERS Inhale 2 puffs into the lungs daily. 2 Inhaler 4   No current facility-administered medications for this visit.     REVIEW OF SYSTEMS:   X denotes positive finding, denotes negative finding Cardiac  Comments:  Chest pain or chest pressure:    Shortness of breath upon exertion:    Short of breath when lying flat:    Irregular heart rhythm:        Vascular    Pain in calf, thigh, or hip brought on by ambulation:    Pain in feet at night that wakes you up from your sleep:     Blood clot in your veins:    Leg swelling:  x       Pulmonary    Oxygen at home:    Productive cough:     Wheezing:         Neurologic    Sudden weakness in arms or legs:     Sudden numbness in arms or legs:     Sudden onset of difficulty speaking or slurred speech:    Temporary loss of vision in one eye:     Problems with dizziness:         Gastrointestinal    Blood in stool:     Vomited blood:         Genitourinary    Burning when urinating:     Blood in urine:        Psychiatric    Major depression:         Hematologic    Bleeding problems:    Problems with blood clotting too easily:        Skin    Rashes or  ulcers:  Constitutional    Fever or chills:      PHYSICAL EXAM:   There were no vitals filed for this visit.  GENERAL: The patient is a well-nourished male, in no acute distress. The vital signs are documented above. CARDIAC: There is a regular rate and rhythm.  VASCULAR: No carotid bruits.  Bilateral lower extremity edema PULMONARY: Non-labored respirations ABDOMEN: Soft and non-tender with normal pitched bowel sounds.  MUSCULOSKELETAL: There are no major deformities or cyanosis. NEUROLOGIC: No focal weakness or paresthesias are detected. SKIN: There are no ulcers or rashes noted. PSYCHIATRIC: The patient has a normal affect.  STUDIES:   I have ordered and reviewed his ultrasound which shows no evidence of endoleak of his aneurysm and maximum diameter of 3.8 cm  MEDICAL ISSUES:   AAA: Status post endovascular repair of a 5.0 cm infrarenal abdominal aortic aneurysm.  Ultrasound today shows nice decrease in the sac measuring 3.8 cm.  He will continue with annual surveillance via ultrasound  Leg swelling: This is likely related to his congestive heart failure.  He is scheduled to see his primary doctor tomorrow    Annamarie Major, MD Vascular and Vein Specialists of [facility] [phone] Pager [phone]

## 2017-10-19 ENCOUNTER — Encounter: Payer: Self-pay | Admitting: Internal Medicine

## 2017-10-19 ENCOUNTER — Encounter: Payer: Self-pay | Admitting: *Deleted

## 2017-10-19 ENCOUNTER — Ambulatory Visit (INDEPENDENT_AMBULATORY_CARE_PROVIDER_SITE_OTHER): Payer: Medicare Other | Admitting: Internal Medicine

## 2017-10-19 VITALS — BP 140/90 | HR 98 | Temp 97.9°F | Wt 199.0 lb

## 2017-10-19 DIAGNOSIS — I482 Chronic atrial fibrillation, unspecified: Secondary | ICD-10-CM

## 2017-10-19 DIAGNOSIS — N183 Chronic kidney disease, stage 3 unspecified: Secondary | ICD-10-CM

## 2017-10-19 DIAGNOSIS — I5043 Acute on chronic combined systolic (congestive) and diastolic (congestive) heart failure: Secondary | ICD-10-CM

## 2017-10-19 DIAGNOSIS — I2510 Atherosclerotic heart disease of native coronary artery without angina pectoris: Secondary | ICD-10-CM

## 2017-10-19 DIAGNOSIS — Z006 Encounter for examination for normal comparison and control in clinical research program: Secondary | ICD-10-CM

## 2017-10-19 DIAGNOSIS — Z9861 Coronary angioplasty status: Secondary | ICD-10-CM

## 2017-10-19 NOTE — Research (Signed)
[facility]ERS [facility] II Research study month 24 telephone visit completed. Patient denies and changes in medication or adverse events.  Last research required telephone visit window is due 07/12/2018- 11/09/2018.

## 2017-10-19 NOTE — Progress Notes (Signed)
Subjective:    Patient ID: Jason Fisher, male    DOB: 23-May-1940, 77 y.o.   MRN: [id number]  HPI 77 year old patient who is seen today in follow-up.  He has a history of chronic atrial fibrillation and remains on chronic anticoagulation.  He has coronary artery disease as well as chronic diastolic heart failure. For the past 2 weeks she has had increasing ankle and pedal edema.  He has resumed furosemide 40 mg daily.  He has some associated cough that probably is chronic.  Denies any worsening DOE.  He chronically sleeps in a recliner  Past Medical History:  Diagnosis Date  . 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 BENIGN PROSTATIC HYPERTROPHY, HX OF 02/24/2007  . CAD S/P PCI    a. 2007 Inf STEMI s/p Vision BMS  2 to RCA;  b. 2009 ISR Prox RCA Stent-->with DES;  c. 09/2015 PCI: LM nl, LAD 30p/m, D1 90, LCX nl, OM1 small, 80, OM2 90 (2.5x14 Biofreedom stent), OM3 small, RCA 30p ISR, 33m ISR, 70m, 85d, EF 50-55%.  . 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 Chronic atrial fibrillation ([place]) 11/06/2009   a. On Eliquis (CHA2DS2VASc = 5).  Rate controlled w/ BB and CCB.  Marland Kitchen Chronic diastolic CHF (congestive heart failure) ([place])    a. 09/2015 Echo: EF 55-60%, mildly dil LA.  . CKD (chronic kidney disease), stage III ([place])    they mentioned this to the patient but they would work on it later after the aneurysm  . COLONIC POLYPS, HX OF 11/02/2006  . COPD (chronic obstructive pulmonary disease) ([place])   . DIABETES MELLITUS, TYPE II 10/29/2006  . Diverticulosis   . History of tobacco abuse    a. Quit 09/2015.  Marland Kitchen HYPERLIPIDEMIA 10/29/2006  . Hypertensive heart disease with heart failure ([place])   . Pneumonia 09/2015   [name] 07/31/2016  . STEMI (ST elevation myocardial infarction) [facility]) 2007   [name] 07/31/2016     Social History   Socioeconomic History  . Marital status: Married    Spouse name: Not on file  . Number of children: 5  . Years of education: Not on file  . Highest education level:  Not on file  Occupational History  . Occupation: [employer]  Social Needs  . Financial resource strain: Not on file  . Food insecurity:    Worry: Not on file    Inability: Not on file  . Transportation needs:    Medical: Not on file    Non-medical: Not on file  Tobacco Use  . Smoking status: Former Smoker    Packs/day: 1.00    Years: 49.00    Pack years: 49.00    Types: Cigarettes    Last attempt to quit: 09/08/2015    Years since quitting: 2.1  . Smokeless tobacco: Never Used  . Tobacco comment: passive smoker as well  Substance and Sexual Activity  . Alcohol use: No    Alcohol/week: 0.0 standard drinks  . Drug use: No  . Sexual activity: Not on file  Lifestyle  . Physical activity:    Days per week: Not on file    Minutes per session: Not on file  . Stress: Not on file  Relationships  . Social connections:    Talks on phone: Not on file    Gets together: Not on file    Attends religious service: Not on file    Active member of club or organization: Not on file    Attends  meetings of clubs or organizations: Not on file    Relationship status: Not on file  . Intimate partner violence:    Fear of current or ex partner: Not on file    Emotionally abused: Not on file    Physically abused: Not on file    Forced sexual activity: Not on file  Other Topics Concern  . Not on file  Social History Narrative  . Not on file    Past Surgical History:  Procedure Laterality Date  . ABDOMINAL AORTIC ENDOVASCULAR STENT GRAFT N/A 07/31/2016   Procedure: ABDOMINAL AORTIC ENDOVASCULAR STENT GRAFT;  Surgeon: Serafina Mitchell, MD;  Location: [place];  Service: Vascular;  Laterality: N/A;  . CARDIAC CATHETERIZATION N/A 09/26/2015   Procedure: Left Heart Cath and Coronary Angiography;  Surgeon: Peter M Martinique, MD;  Location: [place] CV [facility];  Service: Cardiovascular;  Laterality: N/A;  . CARDIAC CATHETERIZATION N/A 09/26/2015   Procedure: Coronary Stent Intervention;  Surgeon: Peter M  Martinique, MD;  Location: [place] CV [facility];  Service: Cardiovascular;  Laterality: N/A;  . CATARACT EXTRACTION Bilateral   . COLONOSCOPY    . CORONARY ANGIOPLASTY WITH STENT PLACEMENT  2007   Inferior STEMI 2007 - RCA PCI with 2 Vision BMS; Abnormal Myoview in 2009 - pRCA ins-stent restenosis & unstented disease - DES PCI Promus 3.0 x 20 mm (3.5 mm)  . ENDOVASCULAR REPAIR/STENT GRAFT  07/31/2016  . INCISION AND DRAINAGE PERIRECTAL ABSCESS N/A 11/06/2015   Procedure: IRRIGATION AND DEBRIDEMENT PERIRECTAL ABSCESS, POSSIBLE HEMORRHOIDECTOMY;  Surgeon: Coralie Keens, MD;  Location: [place];  Service: General;  Laterality: N/A;  . ORIF SHOULDER FRACTURE Right    "fell out of back end of a truck"    Family History  Problem Relation Age of Onset  . Heart attack Mother   . Diabetes Mother   . Heart attack Father   . Hypertension Father   . Heart attack Brother   . Diabetes Brother     Allergies  Allergen Reactions  . Contrast Media [Iodinated Diagnostic Agents] Anaphylaxis    Current Outpatient Medications on File Prior to Visit  Medication Sig Dispense Refill  . ACCU-CHEK AVIVA PLUS test strip CHECK BLOOD SUGAR ONCE DAILY AS NEEDED 100 each 2  . apixaban (ELIQUIS) 5 MG TABS tablet Take 1 tablet (5 mg total) by mouth 2 (two) times daily. 60 tablet 4  . atorvastatin (LIPITOR) 20 MG tablet Take 1 tablet (20 mg total) by mouth daily. 90 tablet 4  . clopidogrel (PLAVIX) 75 MG tablet Take 1 tablet (75 mg total) by mouth daily. 90 tablet 3  . diltiazem (CARDIZEM CD) 360 MG 24 hr capsule Take 1 capsule (360 mg total) by mouth daily. 90 capsule 4  . furosemide (LASIX) 40 MG tablet Take 1 tablet (40 mg total) by mouth daily as needed. 90 tablet 2  . glipiZIDE (GLUCOTROL XL) 10 MG 24 hr tablet Take 1 tablet (10 mg total) by mouth daily with breakfast. 90 tablet 4  . Glycopyrrolate-Formoterol (BEVESPI AEROSPHERE) 9-4.8 MCG/ACT AERO Inhale 2 puffs into the lungs 2 (two) times daily. 1 Inhaler 5  .  hydrALAZINE (APRESOLINE) 25 MG tablet Take 1 tablet (25 mg total) by mouth 3 (three) times daily. 270 tablet 3  . levalbuterol (XOPENEX HFA) 45 MCG/ACT inhaler Inhale 1-2 puffs into the lungs every 6 (six) hours as needed for wheezing. 1 Inhaler 5  . liraglutide (VICTOZA) 18 MG/3ML SOPN Inject 0.3 mLs (1.8 mg total) into the skin daily.  10 pen 1  . metFORMIN (GLUCOPHAGE) 1000 MG tablet TAKE 1 TABLET TWICE DAILY WITH A MEAL 180 tablet 4  . metoprolol tartrate (LOPRESSOR) 100 MG tablet Take 1 tablet (100 mg total) by mouth 2 (two) times daily. 180 tablet 4  . nitroGLYCERIN (NITROSTAT) 0.4 MG SL tablet Place 1 tablet (0.4 mg total) under the tongue every 5 (five) minutes as needed for chest pain. X 3 doses 25 tablet 4  . polyethylene glycol (MIRALAX / GLYCOLAX) packet Take 17 g by mouth daily as needed for mild constipation.    . Tiotropium Bromide-Olodaterol (STIOLTO RESPIMAT) 2.5-2.5 MCG/ACT AERS Inhale 2 puffs into the lungs daily. 2 Inhaler 4   No current facility-administered medications on file prior to visit.     BP 140/90 (BP Location: Left Arm, Patient Position: Sitting, Cuff Size: Large)   Pulse 98   Temp 97.9 F (36.6 C) (Oral)   Wt 199 lb (90.3 kg)   SpO2 95%   BMI 28.55 kg/m      Review of Systems  Constitutional: Negative for appetite change, chills, fatigue and fever.  HENT: Negative for congestion, dental problem, ear pain, hearing loss, sore throat, tinnitus, trouble swallowing and voice change.   Eyes: Negative for pain, discharge and visual disturbance.  Respiratory: Positive for cough. Negative for chest tightness, wheezing and stridor.   Cardiovascular: Positive for leg swelling. Negative for chest pain and palpitations.  Gastrointestinal: Negative for abdominal distention, abdominal pain, blood in stool, constipation, diarrhea, nausea and vomiting.  Genitourinary: Negative for difficulty urinating, discharge, flank pain, genital sores, hematuria and urgency.    Musculoskeletal: Negative for arthralgias, back pain, gait problem, joint swelling, myalgias and neck stiffness.  Skin: Negative for rash.  Neurological: Negative for dizziness, syncope, speech difficulty, weakness, numbness and headaches.  Hematological: Negative for adenopathy. Does not bruise/bleed easily.  Psychiatric/Behavioral: Negative for behavioral problems and dysphoric mood. The patient is not nervous/anxious.        Objective:   Physical Exam  Constitutional: He is oriented to person, place, and time. He appears well-developed.  HENT:  Head: Normocephalic.  Right Ear: External ear normal.  Left Ear: External ear normal.  Eyes: Conjunctivae and EOM are normal.  Neck: Normal range of motion.  Cardiovascular: Normal rate and normal heart sounds.  Pulmonary/Chest: Effort normal.  Slight decrease breath sounds.  Few faint scattered rhonchi  Abdominal: Bowel sounds are normal.  Musculoskeletal: Normal range of motion. He exhibits edema. He exhibits no tenderness.  +2 ankle and pedal edema  Neurological: He is alert and oriented to person, place, and time.  Psychiatric: He has a normal mood and affect. His behavior is normal.          Assessment & Plan:   Pedal edema.  History of diastolic heart failure as well as COPD.  Low-salt diet encouraged.  Will increase furosemide to a twice daily regimen for 1 week and then decrease back to once daily Cardiology follow-up COPD Diabetes mellitus.   Lab Results  Component Value Date   HGBA1C 7.6 (A) 09/23/2017   Victoza uptitrated last month  Follow-up 2 months  Marletta Lor

## 2017-10-19 NOTE — Patient Instructions (Addendum)
Limit your sodium (Salt) intake  Increase furosemide to 40 mg twice daily.  Okay to resume once daily in the morning after swelling involving the ankles and feet have improved  Return in 2 months as scheduled  Cardiology follow-up  Office follow-up sooner if weight gain or worsening swelling.  Call the office if you develop any worsening shortness of breath

## 2017-10-21 ENCOUNTER — Telehealth: Payer: Self-pay | Admitting: Cardiology

## 2017-10-21 NOTE — Telephone Encounter (Signed)
Called patient, LVM to call back to discuss swelling.   Advise from PCP visit on 09/17: Prior Versions: 1. Jason Lor, MD (Physician) at 10/19/2017 2:12 PM - Signed    Limit your sodium (Salt) intake  Increase furosemide to 40 mg twice daily.  Okay to resume once daily in the morning after swelling involving the ankles and feet have improved

## 2017-10-21 NOTE — Telephone Encounter (Signed)
° °  1) How much weight have you gained and in what time span? n/a  2) If swelling, where is the swelling located? ANKLES, FEET  3) Are you currently taking a fluid pill?YES  4) Are you currently SOB? NO per spouse,  gets SOB when walking stairs  5) Do you have a log of your daily weights (if so, list)? n/a  6) Have you gained 3 pounds in a day or 5 pounds in a week? n/a  7) Have you traveled recently? No

## 2017-10-22 NOTE — Telephone Encounter (Signed)
Attempted to contact patient, LVM to call back to discuss swelling.

## 2017-10-26 NOTE — Telephone Encounter (Signed)
Patient wife call back, states that his leg swelling is about the same, patient states that he is watching his salt intake, elevating his legs, and he tried compression stockings but did not like them and took them off. I did advise wife to ask him to wear them for short periods of time throughout the day until his upcoming visit on 11/03/17, patient wife verbalized understanding, I did advise her to monitor his weight and to keep a record of them, patient wife states BP is good and HR, patient denies SOB at this time, no current CP or chest tightness. He is still following instructions from PCP to take lasix 40 mg BID as swelling has not went down.  Patient wife verbalized to call if any other issues occurred.

## 2017-10-26 NOTE — Telephone Encounter (Signed)
Called patient to check in on swelling, LVM to call back to discuss, left call back number.

## 2017-11-03 ENCOUNTER — Encounter: Payer: Self-pay | Admitting: Physician Assistant

## 2017-11-03 ENCOUNTER — Ambulatory Visit: Payer: Medicare Other | Admitting: Physician Assistant

## 2017-11-03 VITALS — BP 153/82 | HR 79 | Ht 70.0 in | Wt 201.0 lb

## 2017-11-03 DIAGNOSIS — Z8679 Personal history of other diseases of the circulatory system: Secondary | ICD-10-CM

## 2017-11-03 DIAGNOSIS — I10 Essential (primary) hypertension: Secondary | ICD-10-CM

## 2017-11-03 DIAGNOSIS — I5033 Acute on chronic diastolic (congestive) heart failure: Secondary | ICD-10-CM | POA: Diagnosis not present

## 2017-11-03 DIAGNOSIS — I2510 Atherosclerotic heart disease of native coronary artery without angina pectoris: Secondary | ICD-10-CM | POA: Diagnosis not present

## 2017-11-03 DIAGNOSIS — Z9889 Other specified postprocedural states: Secondary | ICD-10-CM

## 2017-11-03 DIAGNOSIS — E785 Hyperlipidemia, unspecified: Secondary | ICD-10-CM

## 2017-11-03 DIAGNOSIS — I482 Chronic atrial fibrillation, unspecified: Secondary | ICD-10-CM

## 2017-11-03 MED ORDER — POTASSIUM CHLORIDE CRYS ER 20 MEQ PO TBCR: EXTENDED_RELEASE_TABLET | ORAL | 3 refills | Status: DC

## 2017-11-03 NOTE — Patient Instructions (Signed)
Medication Instructions: [name], PA has recommended making the following medication changes: 1. INCREASE Furosemide to 80 mg for 3 days THEN RESUME USUAL DOSE 2. START Potassium 20 mEq - take 2 tablet (40 mEq total) twice daily for 3 days THEN take 1 tablet twice daily thereafter  Labwork: Your physician recommends that you have lab work TODAY and return for lab work again in [facility].  Testing/Procedures: 1. Echocardiogram - Your physician has requested that you have an echocardiogram. Echocardiography is a painless test that uses sound waves to create images of your heart. It provides your doctor with information about the size and shape of your heart and how well your heart's chambers and valves are working. This procedure takes approximately one hour. There are no restrictions for this procedure.  >> This will be performed at our [facility] location [place], [place] [address] [phone]  Follow-up: Isaac Laud recommends that you schedule a follow-up appointment in 2 weeks.  If you need a refill on your cardiac medications before your next appointment, please call your pharmacy.

## 2017-11-03 NOTE — Progress Notes (Signed)
Cardiology Office Note    Date:  11/05/2017   ID:  HILLERY BHALLA, DOB 16-Apr-1940, MRN [id number]  PCP:  Marletta Lor, MD  Cardiologist:  Dr. Martinique  Chief Complaint  Patient presents with  . Follow-up    pt denied chest pain    History of Present Illness:  Jason Fisher is a 77 y.o. male with PMH of CAD (s/p BMS to RCA 2007, ISR with DES to RCA in 2009, DES to OM2 in 09/2015), chronic atrial fibrillation on Eliquis, chronic diastolic heart failure, HTN, HLD, AAA, COPD, and tobacco use.  He was previously admitted in March 2018 for atrial fibrillation with RVR.  Troponin was borderline elevated.  He was initially treated with IV Cardizem and later switched to p.o. Cardizem prior to discharge.  He also underwent AAA endovascular stent grafting by Dr. Trula Slade in June 2018.  He was last seen by Dr. Martinique in July 2018 at which time he was doing well.  His blood pressure was a little bit high, therefore hydralazine was added to his medical regimen.  He was most recently seen by his primary care provider on 10/19/2017 for increasing ankle and pedal edema.  He was instructed to increase his Lasix to 40 mg twice daily for 1 week and decreasing back down to 40 mg daily thereafter.  Patient presents today for cardiology office visit.  For the past month, patient has been noticing increasing lower extremity edema.  He has been taking 40 mg twice daily dosing since he saw his primary care provider, this has not improved his symptoms.  He also have occasional cough as well.  On physical exam he has bibasilar crackle.  He has 1+ pitting edema in bilateral lower extremity including pedal edema as well.  He is clearly volume overloaded.  I will increase his Lasix to 80 mg twice daily for 3 days before decreasing it to 40 mg twice daily thereafter.  I will also add on potassium supplement 40 mEq twice daily for 3 days before decreasing it to 20 mg twice daily.  He will need a basic metabolic panel today  and also in 1 week.  I also plan to obtain echocardiogram to see if there is any change in the ejection fraction.  Otherwise I will see him back in 2 weeks for reassessment.  He denies any recent chest discomfort.  Past Medical History:  Diagnosis Date  . 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 BENIGN PROSTATIC HYPERTROPHY, HX OF 02/24/2007  . CAD S/P PCI    a. 2007 Inf STEMI s/p Vision BMS  2 to RCA;  b. 2009 ISR Prox RCA Stent-->with DES;  c. 09/2015 PCI: LM nl, LAD 30p/m, D1 90, LCX nl, OM1 small, 80, OM2 90 (2.5x14 Biofreedom stent), OM3 small, RCA 30p ISR, 108m ISR, 74m, 85d, EF 50-55%.  . 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 Chronic atrial fibrillation 11/06/2009   a. On Eliquis (CHA2DS2VASc = 5).  Rate controlled w/ BB and CCB.  Marland Kitchen Chronic diastolic CHF (congestive heart failure) ([place])    a. 09/2015 Echo: EF 55-60%, mildly dil LA.  . CKD (chronic kidney disease), stage III ([place])    they mentioned this to the patient but they would work on it later after the aneurysm  . COLONIC POLYPS, HX OF 11/02/2006  . COPD (chronic obstructive pulmonary disease) ([place])   . DIABETES MELLITUS, TYPE II 10/29/2006  . Diverticulosis   . History of tobacco abuse  a. Quit 09/2015.  Marland Kitchen HYPERLIPIDEMIA 10/29/2006  . Hypertensive heart disease with heart failure ([place])   . Pneumonia 09/2015   [name] 07/31/2016  . STEMI (ST elevation myocardial infarction) [facility]) 2007   [name] 07/31/2016    Past Surgical History:  Procedure Laterality Date  . ABDOMINAL AORTIC ENDOVASCULAR STENT GRAFT N/A 07/31/2016   Procedure: ABDOMINAL AORTIC ENDOVASCULAR STENT GRAFT;  Surgeon: Serafina Mitchell, MD;  Location: [place];  Service: Vascular;  Laterality: N/A;  . CARDIAC CATHETERIZATION N/A 09/26/2015   Procedure: Left Heart Cath and Coronary Angiography;  Surgeon: Peter M Martinique, MD;  Location: [place] CV [facility];  Service: Cardiovascular;  Laterality: N/A;  . CARDIAC CATHETERIZATION N/A 09/26/2015   Procedure: Coronary Stent  Intervention;  Surgeon: Peter M Martinique, MD;  Location: [place] CV [facility];  Service: Cardiovascular;  Laterality: N/A;  . CATARACT EXTRACTION Bilateral   . COLONOSCOPY    . CORONARY ANGIOPLASTY WITH STENT PLACEMENT  2007   Inferior STEMI 2007 - RCA PCI with 2 Vision BMS; Abnormal Myoview in 2009 - pRCA ins-stent restenosis & unstented disease - DES PCI Promus 3.0 x 20 mm (3.5 mm)  . ENDOVASCULAR REPAIR/STENT GRAFT  07/31/2016  . INCISION AND DRAINAGE PERIRECTAL ABSCESS N/A 11/06/2015   Procedure: IRRIGATION AND DEBRIDEMENT PERIRECTAL ABSCESS, POSSIBLE HEMORRHOIDECTOMY;  Surgeon: Coralie Keens, MD;  Location: [place];  Service: General;  Laterality: N/A;  . ORIF SHOULDER FRACTURE Right    "fell out of back end of a truck"    Current Medications: Outpatient Medications Prior to Visit  Medication Sig Dispense Refill  . ACCU-CHEK AVIVA PLUS test strip CHECK BLOOD SUGAR ONCE DAILY AS NEEDED 100 each 2  . apixaban (ELIQUIS) 5 MG TABS tablet Take 1 tablet (5 mg total) by mouth 2 (two) times daily. 60 tablet 4  . atorvastatin (LIPITOR) 20 MG tablet Take 1 tablet (20 mg total) by mouth daily. 90 tablet 4  . clopidogrel (PLAVIX) 75 MG tablet Take 1 tablet (75 mg total) by mouth daily. 90 tablet 3  . diltiazem (CARDIZEM CD) 360 MG 24 hr capsule Take 1 capsule (360 mg total) by mouth daily. 90 capsule 4  . furosemide (LASIX) 40 MG tablet Take 1 tablet (40 mg total) by mouth daily as needed. 90 tablet 2  . glipiZIDE (GLUCOTROL XL) 10 MG 24 hr tablet Take 1 tablet (10 mg total) by mouth daily with breakfast. 90 tablet 4  . Glycopyrrolate-Formoterol (BEVESPI AEROSPHERE) 9-4.8 MCG/ACT AERO Inhale 2 puffs into the lungs 2 (two) times daily. 1 Inhaler 5  . hydrALAZINE (APRESOLINE) 25 MG tablet Take 1 tablet (25 mg total) by mouth 3 (three) times daily. 270 tablet 3  . levalbuterol (XOPENEX HFA) 45 MCG/ACT inhaler Inhale 1-2 puffs into the lungs every 6 (six) hours as needed for wheezing. 1 Inhaler 5  .  liraglutide (VICTOZA) 18 MG/3ML SOPN Inject 0.3 mLs (1.8 mg total) into the skin daily. 10 pen 1  . metFORMIN (GLUCOPHAGE) 1000 MG tablet TAKE 1 TABLET TWICE DAILY WITH A MEAL 180 tablet 4  . metoprolol tartrate (LOPRESSOR) 100 MG tablet Take 1 tablet (100 mg total) by mouth 2 (two) times daily. 180 tablet 4  . nitroGLYCERIN (NITROSTAT) 0.4 MG SL tablet Place 1 tablet (0.4 mg total) under the tongue every 5 (five) minutes as needed for chest pain. X 3 doses 25 tablet 4  . polyethylene glycol (MIRALAX / GLYCOLAX) packet Take 17 g by mouth daily as needed for mild constipation.    Marland Kitchen  Tiotropium Bromide-Olodaterol (STIOLTO RESPIMAT) 2.5-2.5 MCG/ACT AERS Inhale 2 puffs into the lungs daily. 2 Inhaler 4   No facility-administered medications prior to visit.      Allergies:   Contrast media [iodinated diagnostic agents]   Social History   Socioeconomic History  . Marital status: Married    Spouse name: Not on file  . Number of children: 5  . Years of education: Not on file  . Highest education level: Not on file  Occupational History  . Occupation: [employer]  Social Needs  . Financial resource strain: Not on file  . Food insecurity:    Worry: Not on file    Inability: Not on file  . Transportation needs:    Medical: Not on file    Non-medical: Not on file  Tobacco Use  . Smoking status: Former Smoker    Packs/day: 1.00    Years: 49.00    Pack years: 49.00    Types: Cigarettes    Last attempt to quit: 09/08/2015    Years since quitting: 2.1  . Smokeless tobacco: Never Used  . Tobacco comment: passive smoker as well  Substance and Sexual Activity  . Alcohol use: No    Alcohol/week: 0.0 standard drinks  . Drug use: No  . Sexual activity: Not on file  Lifestyle  . Physical activity:    Days per week: Not on file    Minutes per session: Not on file  . Stress: Not on file  Relationships  . Social connections:    Talks on phone: Not on file    Gets together: Not on file     Attends religious service: Not on file    Active member of club or organization: Not on file    Attends meetings of clubs or organizations: Not on file    Relationship status: Not on file  Other Topics Concern  . Not on file  Social History Narrative  . Not on file     Family History:  The patient's family history includes Diabetes in his brother and mother; Heart attack in his brother, father, and mother; Hypertension in his father.   ROS:   Please see the history of present illness.    ROS All other systems reviewed and are negative.   PHYSICAL EXAM:   VS:  BP (!) 153/82   Pulse 79   Ht 5\' 10"  (1.778 m)   Wt 201 lb (91.2 kg)   BMI 28.84 kg/m    GEN: Well nourished, well developed, in no acute distress  HEENT: normal  Neck: no JVD, carotid bruits, or masses Cardiac: irregularly irregular; no murmurs, rubs, or gallops,no edema  Respiratory:  clear to auscultation bilaterally, normal work of breathing GI: soft, nontender, nondistended, + BS MS: no deformity or atrophy  Skin: warm and dry, no rash Neuro:  Alert and Oriented x 3, Strength and sensation are intact Psych: euthymic mood, full affect  Wt Readings from Last 3 Encounters:  11/03/17 201 lb (91.2 kg)  10/19/17 199 lb (90.3 kg)  10/18/17 195 lb (88.5 kg)      Studies/Labs Reviewed:   EKG:  EKG is ordered today.  The ekg ordered today demonstrates atrial fibrillation HR 79  Recent Labs: 11/03/2017: BUN 18; Creatinine, Ser 1.20; Potassium 3.9; Sodium 143   Lipid Panel    Component Value Date/Time   CHOL 137 09/25/2015 0221   TRIG 64 09/25/2015 0221   HDL 47 09/25/2015 0221   CHOLHDL 2.9 09/25/2015 0221  VLDL 13 09/25/2015 0221   LDLCALC 77 09/25/2015 0221    Additional studies/ records that were reviewed today include:   Echo 09/27/2015 LV EF: 55% -   60% Study Conclusions  - Left ventricle: The cavity size was normal. Systolic function was   normal. The estimated ejection fraction was in the  range of 55%   to 60%. Wall motion was normal; there were no regional wall   motion abnormalities. Doppler parameters are consistent with   elevated mean left atrial filling pressure. - Left atrium: The atrium was mildly dilated.    ASSESSMENT:    1. Acute on chronic diastolic congestive heart failure ([place])   2. Coronary artery disease involving native coronary artery of native heart without angina pectoris   3. Chronic atrial fibrillation   4. Essential hypertension   5. Hyperlipidemia, unspecified hyperlipidemia type   6. S/P AAA repair      PLAN:  In order of problems listed above:  1. Acute on chronic diastolic heart failure: Increase Lasix to 80 mg for 3 days before coming back down to 40 mg twice daily.  Also increase potassium for 3 days before resuming 20 mg twice daily.  2. CAD: Denies any chest pain.  Not on aspirin given the need for Eliquis  3. Chronic atrial fibrillation: On Eliquis.  Rate controlled with metoprolol.  4. Hyperlipidemia: On Lipitor 20 mg daily.  Fasting lipid panel in August 2017.  Will defer to primary care provider to recheck annual lipid panel.  5. History of AAA repair: Followed by Dr. Trula Slade of vascular surgery.    Medication Adjustments/Labs and Tests Ordered: Current medicines are reviewed at length with the patient today.  Concerns regarding medicines are outlined above.  Medication changes, Labs and Tests ordered today are listed in the Patient Instructions below. Patient Instructions  Medication Instructions: [name], PA has recommended making the following medication changes: 1. INCREASE Furosemide to 80 mg for 3 days THEN RESUME USUAL DOSE 2. START Potassium 20 mEq - take 2 tablet (40 mEq total) twice daily for 3 days THEN take 1 tablet twice daily thereafter  Labwork: Your physician recommends that you have lab work TODAY and return for lab work again in [facility].  Testing/Procedures: 1. Echocardiogram - Your physician has  requested that you have an echocardiogram. Echocardiography is a painless test that uses sound waves to create images of your heart. It provides your doctor with information about the size and shape of your heart and how well your heart's chambers and valves are working. This procedure takes approximately one hour. There are no restrictions for this procedure.  >> This will be performed at our [facility] location [place], [place] [address] [phone]  Follow-up: Isaac Laud recommends that you schedule a follow-up appointment in 2 weeks.  If you need a refill on your cardiac medications before your next appointment, please call your pharmacy.    Hilbert Corrigan, [place]  11/05/2017 11:29 PM    [address], [place], [place]  [address] Phone: [phone]; Fax: [phone]

## 2017-11-04 LAB — BASIC METABOLIC PANEL
BUN/Creatinine Ratio: 15 (ref 10–24)
BUN: 18 mg/dL (ref 8–27)
CALCIUM: 9.4 mg/dL (ref 8.6–10.2)
CO2: 27 mmol/L (ref 20–29)
Chloride: 102 mmol/L (ref 96–106)
Creatinine, Ser: 1.2 mg/dL (ref 0.76–1.27)
GFR calc Af Amer: 67 mL/min/{1.73_m2} (ref >59)
GFR, EST NON AFRICAN AMERICAN: 58 mL/min/{1.73_m2} — AB (ref >59)
GLUCOSE: 153 mg/dL — AB (ref 65–99)
POTASSIUM: 3.9 mmol/L (ref 3.5–5.2)
Sodium: 143 mmol/L (ref 134–144)

## 2017-11-05 ENCOUNTER — Encounter: Payer: Self-pay | Admitting: Physician Assistant

## 2017-11-05 ENCOUNTER — Other Ambulatory Visit: Payer: Self-pay

## 2017-11-05 ENCOUNTER — Ambulatory Visit (HOSPITAL_COMMUNITY): Payer: Medicare Other | Attending: Cardiology

## 2017-11-05 DIAGNOSIS — I4891 Unspecified atrial fibrillation: Secondary | ICD-10-CM | POA: Diagnosis not present

## 2017-11-05 DIAGNOSIS — I2510 Atherosclerotic heart disease of native coronary artery without angina pectoris: Secondary | ICD-10-CM | POA: Diagnosis not present

## 2017-11-05 DIAGNOSIS — E119 Type 2 diabetes mellitus without complications: Secondary | ICD-10-CM | POA: Diagnosis not present

## 2017-11-05 DIAGNOSIS — I5033 Acute on chronic diastolic (congestive) heart failure: Secondary | ICD-10-CM

## 2017-11-05 DIAGNOSIS — J449 Chronic obstructive pulmonary disease, unspecified: Secondary | ICD-10-CM | POA: Diagnosis not present

## 2017-11-05 DIAGNOSIS — I110 Hypertensive heart disease with heart failure: Secondary | ICD-10-CM | POA: Diagnosis not present

## 2017-11-05 DIAGNOSIS — E785 Hyperlipidemia, unspecified: Secondary | ICD-10-CM | POA: Insufficient documentation

## 2017-11-11 LAB — BASIC METABOLIC PANEL
BUN/Creatinine Ratio: 13 (ref 10–24)
BUN: 17 mg/dL (ref 8–27)
CALCIUM: 9.6 mg/dL (ref 8.6–10.2)
CHLORIDE: 100 mmol/L (ref 96–106)
CO2: 28 mmol/L (ref 20–29)
Creatinine, Ser: 1.31 mg/dL — ABNORMAL HIGH (ref 0.76–1.27)
GFR calc Af Amer: 60 mL/min/{1.73_m2} (ref >59)
GFR calc non Af Amer: 52 mL/min/{1.73_m2} — ABNORMAL LOW (ref >59)
GLUCOSE: 158 mg/dL — AB (ref 65–99)
Potassium: 4 mmol/L (ref 3.5–5.2)
Sodium: 143 mmol/L (ref 134–144)

## 2017-11-17 ENCOUNTER — Encounter: Payer: Self-pay | Admitting: Physician Assistant

## 2017-11-17 ENCOUNTER — Ambulatory Visit: Payer: Medicare Other | Admitting: Physician Assistant

## 2017-11-17 VITALS — BP 122/90 | HR 69 | Ht 69.0 in | Wt 202.8 lb

## 2017-11-17 DIAGNOSIS — I10 Essential (primary) hypertension: Secondary | ICD-10-CM | POA: Diagnosis not present

## 2017-11-17 DIAGNOSIS — I714 Abdominal aortic aneurysm, without rupture, unspecified: Secondary | ICD-10-CM

## 2017-11-17 DIAGNOSIS — I482 Chronic atrial fibrillation, unspecified: Secondary | ICD-10-CM | POA: Diagnosis not present

## 2017-11-17 DIAGNOSIS — J449 Chronic obstructive pulmonary disease, unspecified: Secondary | ICD-10-CM

## 2017-11-17 DIAGNOSIS — I2510 Atherosclerotic heart disease of native coronary artery without angina pectoris: Secondary | ICD-10-CM | POA: Diagnosis not present

## 2017-11-17 DIAGNOSIS — I5033 Acute on chronic diastolic (congestive) heart failure: Secondary | ICD-10-CM

## 2017-11-17 DIAGNOSIS — E785 Hyperlipidemia, unspecified: Secondary | ICD-10-CM

## 2017-11-17 LAB — BASIC METABOLIC PANEL
BUN/Creatinine Ratio: 9 — ABNORMAL LOW (ref 10–24)
BUN: 12 mg/dL (ref 8–27)
CHLORIDE: 101 mmol/L (ref 96–106)
CO2: 25 mmol/L (ref 20–29)
CREATININE: 1.34 mg/dL — AB (ref 0.76–1.27)
Calcium: 9.3 mg/dL (ref 8.6–10.2)
GFR calc Af Amer: 59 mL/min/{1.73_m2} — ABNORMAL LOW (ref >59)
GFR calc non Af Amer: 51 mL/min/{1.73_m2} — ABNORMAL LOW (ref >59)
GLUCOSE: 216 mg/dL — AB (ref 65–99)
Potassium: 4.3 mmol/L (ref 3.5–5.2)
Sodium: 144 mmol/L (ref 134–144)

## 2017-11-17 MED ORDER — FUROSEMIDE 40 MG PO TABS: 40.0000 mg | ORAL_TABLET | Freq: Two times a day (BID) | ORAL | 2 refills | Status: DC

## 2017-11-17 MED ORDER — POTASSIUM CHLORIDE CRYS ER 20 MEQ PO TBCR: EXTENDED_RELEASE_TABLET | ORAL | 2 refills | Status: DC

## 2017-11-17 MED ORDER — FUROSEMIDE 80 MG PO TABS: 80.0000 mg | ORAL_TABLET | Freq: Two times a day (BID) | ORAL | 1 refills | Status: DC

## 2017-11-17 MED ORDER — METOLAZONE 2.5 MG PO TABS: ORAL_TABLET | ORAL | 3 refills | Status: DC

## 2017-11-17 NOTE — Patient Instructions (Addendum)
Medication Instructions:  Increase Lasix to 80 mg twice daily Increase Potassium Chloride 20 mg, take two tablets in the morning, and two tablets in the evening, on the day you take Metolazone take extra two tablets at lunch. Start Metolazone 2.5 mg ONCE WEEKLY. If you need a refill on your cardiac medications before your next appointment, please call your pharmacy.   Lab work: BMET today, and BMET next Monday If you have labs (blood work) drawn today and your tests are completely normal, you will receive your results only by: Marland Kitchen MyChart Message (if you have MyChart) OR . A paper copy in the mail If you have any lab test that is abnormal or we need to change your treatment, we will call you to review the results.  Testing/Procedures: None Ordered.  Follow-Up: At [facility], you and your health needs are our priority.  As part of our continuing mission to provide you with exceptional heart care, we have created designated Provider Care Teams.  These Care Teams include your primary Cardiologist (physician) and Advanced Practice Providers (APPs -  Physician Assistants and Nurse Practitioners) who all work together to provide you with the care you need, when you need it. . Follow Up with [name], PA in 2 weeks  Any Other Special Instructions Will Be Listed Below (If Applicable). Take metolazone 2.5 mg 30 minutes before the Lasix once weekly.

## 2017-11-17 NOTE — Progress Notes (Signed)
Cardiology Office Note    Date:  11/17/2017   ID:  Jason Fisher, DOB 11-09-1940, MRN [id number]  PCP:  Marletta Lor, MD  Cardiologist:  Dr. Martinique  Chief Complaint  Patient presents with  . Edema    legs and feet  . Shortness of Breath    when active    History of Present Illness:  Jason Fisher is a 77 y.o. male  with PMH of CAD (s/p BMS to RCA 2007, ISR with DES to RCA in 2009, DES to OM2 in 09/2015), chronic atrial fibrillation on Eliquis, chronic diastolic heart failure, HTN, HLD, AAA, COPD, and tobacco use.  He was previously admitted in March 2018 for atrial fibrillation with RVR.  Troponin was borderline elevated.  He was initially treated with IV Cardizem and later switched to p.o. Cardizem prior to discharge.  He also underwent AAA endovascular stent grafting by Dr. Trula Slade in June 2018.  He was last seen by Dr. Martinique in July 2018 at which time he was doing well.  His blood pressure was a little bit high, therefore hydralazine was added to his medical regimen.  He was most recently seen by his primary care provider on 10/19/2017 for increasing ankle and pedal edema.  He was instructed to increase his Lasix to 40 mg twice daily for 1 week and decreasing back down to 40 mg daily thereafter.  I last saw the patient on 11/03/2017, he continued to take 40 mg twice daily of Lasix since he saw his primary care provider.  However his symptom has not significantly improved.  On physical exam, he has bibasilar crackle and that he had 1+ pitting edema in bilateral lower extremity.  I increased his Lasix to 80 mg twice daily for 3 days before decreasing back down to 40 mg twice daily thereafter.  I repeated echocardiogram on 11/05/2017 this showed EF 60 to 65%, mildly increased aortic root measuring 40 mm, moderately dilated left and the right atrium.  Repeat blood work obtained on 11/10/2017 showed stable renal function and electrolyte.  Patient presents today for follow-up.  Based on  home weight diary, his weight has not changed much and only decreased to about 2 pounds based on home scale.  I will switch him to 80 mg twice daily of Lasix along with 40 mEq twice daily of potassium.  I will also start him on 2.5 mg weekly dosing of metolazone starting tomorrow.  On the day he take the metolazone, he is aware she need to take additional 40 mEq of potassium.  I will obtain basic metabolic panel today and also next Monday to follow-up on renal function and electrolyte.  He still has 2+ pitting edema and has not noticed much improvement breathing wise since the last office visit.  We will try to attempt to diurese more.  I will bring the patient back in 2 weeks for reassessment.  Recent echocardiogram is reassuring.   Past Medical History:  Diagnosis Date  . 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 BENIGN PROSTATIC HYPERTROPHY, HX OF 02/24/2007  . CAD S/P PCI    a. 2007 Inf STEMI s/p Vision BMS  2 to RCA;  b. 2009 ISR Prox RCA Stent-->with DES;  c. 09/2015 PCI: LM nl, LAD 30p/m, D1 90, LCX nl, OM1 small, 80, OM2 90 (2.5x14 Biofreedom stent), OM3 small, RCA 30p ISR, 6m ISR, 73m, 85d, EF 50-55%.  . 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 Chronic atrial fibrillation 11/06/2009  a. On Eliquis (CHA2DS2VASc = 5).  Rate controlled w/ BB and CCB.  Marland Kitchen Chronic diastolic CHF (congestive heart failure) ([place])    a. 09/2015 Echo: EF 55-60%, mildly dil LA.  . CKD (chronic kidney disease), stage III ([place])    they mentioned this to the patient but they would work on it later after the aneurysm  . COLONIC POLYPS, HX OF 11/02/2006  . COPD (chronic obstructive pulmonary disease) ([place])   . DIABETES MELLITUS, TYPE II 10/29/2006  . Diverticulosis   . History of tobacco abuse    a. Quit 09/2015.  Marland Kitchen HYPERLIPIDEMIA 10/29/2006  . Hypertensive heart disease with heart failure ([place])   . Pneumonia 09/2015   [name] 07/31/2016  . STEMI (ST elevation myocardial infarction) [facility]) 2007   [name] 07/31/2016    Past  Surgical History:  Procedure Laterality Date  . ABDOMINAL AORTIC ENDOVASCULAR STENT GRAFT N/A 07/31/2016   Procedure: ABDOMINAL AORTIC ENDOVASCULAR STENT GRAFT;  Surgeon: Serafina Mitchell, MD;  Location: [place];  Service: Vascular;  Laterality: N/A;  . CARDIAC CATHETERIZATION N/A 09/26/2015   Procedure: Left Heart Cath and Coronary Angiography;  Surgeon: Peter M Martinique, MD;  Location: [place] CV LAB;  Service: Cardiovascular;  Laterality: N/A;  . CARDIAC CATHETERIZATION N/A 09/26/2015   Procedure: Coronary Stent Intervention;  Surgeon: Peter M Martinique, MD;  Location: [place] CV LAB;  Service: Cardiovascular;  Laterality: N/A;  . CATARACT EXTRACTION Bilateral   . COLONOSCOPY    . CORONARY ANGIOPLASTY WITH STENT PLACEMENT  2007   Inferior STEMI 2007 - RCA PCI with 2 Vision BMS; Abnormal Myoview in 2009 - pRCA ins-stent restenosis & unstented disease - DES PCI Promus 3.0 x 20 mm (3.5 mm)  . ENDOVASCULAR REPAIR/STENT GRAFT  07/31/2016  . INCISION AND DRAINAGE PERIRECTAL ABSCESS N/A 11/06/2015   Procedure: IRRIGATION AND DEBRIDEMENT PERIRECTAL ABSCESS, POSSIBLE HEMORRHOIDECTOMY;  Surgeon: Coralie Keens, MD;  Location: [place];  Service: General;  Laterality: N/A;  . ORIF SHOULDER FRACTURE Right    "fell out of back end of a truck"    Current Medications: Outpatient Medications Prior to Visit  Medication Sig Dispense Refill  . ACCU-CHEK AVIVA PLUS test strip CHECK BLOOD SUGAR ONCE DAILY AS NEEDED 100 each 2  . apixaban (ELIQUIS) 5 MG TABS tablet Take 1 tablet (5 mg total) by mouth 2 (two) times daily. 60 tablet 4  . atorvastatin (LIPITOR) 20 MG tablet Take 1 tablet (20 mg total) by mouth daily. 90 tablet 4  . clopidogrel (PLAVIX) 75 MG tablet Take 1 tablet (75 mg total) by mouth daily. 90 tablet 3  . diltiazem (CARDIZEM CD) 360 MG 24 hr capsule Take 1 capsule (360 mg total) by mouth daily. 90 capsule 4  . glipiZIDE (GLUCOTROL XL) 10 MG 24 hr tablet Take 1 tablet (10 mg total) by mouth daily  with breakfast. 90 tablet 4  . Glycopyrrolate-Formoterol (BEVESPI AEROSPHERE) 9-4.8 MCG/ACT AERO Inhale 2 puffs into the lungs 2 (two) times daily. 1 Inhaler 5  . hydrALAZINE (APRESOLINE) 25 MG tablet Take 1 tablet (25 mg total) by mouth 3 (three) times daily. 270 tablet 3  . levalbuterol (XOPENEX HFA) 45 MCG/ACT inhaler Inhale 1-2 puffs into the lungs every 6 (six) hours as needed for wheezing. 1 Inhaler 5  . liraglutide (VICTOZA) 18 MG/3ML SOPN Inject 0.3 mLs (1.8 mg total) into the skin daily. 10 pen 1  . metFORMIN (GLUCOPHAGE) 1000 MG tablet TAKE 1 TABLET TWICE DAILY WITH A MEAL 180 tablet 4  .  metoprolol tartrate (LOPRESSOR) 100 MG tablet Take 1 tablet (100 mg total) by mouth 2 (two) times daily. 180 tablet 4  . nitroGLYCERIN (NITROSTAT) 0.4 MG SL tablet Place 1 tablet (0.4 mg total) under the tongue every 5 (five) minutes as needed for chest pain. X 3 doses 25 tablet 4  . polyethylene glycol (MIRALAX / GLYCOLAX) packet Take 17 g by mouth daily as needed for mild constipation.    . Tiotropium Bromide-Olodaterol (STIOLTO RESPIMAT) 2.5-2.5 MCG/ACT AERS Inhale 2 puffs into the lungs daily. 2 Inhaler 4  . furosemide (LASIX) 40 MG tablet Take 1 tablet (40 mg total) by mouth daily as needed. 90 tablet 2  . potassium chloride SA (KLOR-CON M20) 20 MEQ tablet Take 1-2 tablets (20-40 mEq total) by mouth 2 (two) times daily as directed. 135 tablet 3   No facility-administered medications prior to visit.      Allergies:   Contrast media [iodinated diagnostic agents]   Social History   Socioeconomic History  . Marital status: Married    Spouse name: Not on file  . Number of children: 5  . Years of education: Not on file  . Highest education level: Not on file  Occupational History  . Occupation: [employer]  Social Needs  . Financial resource strain: Not on file  . Food insecurity:    Worry: Not on file    Inability: Not on file  . Transportation needs:    Medical: Not on file     Non-medical: Not on file  Tobacco Use  . Smoking status: Former Smoker    Packs/day: 1.00    Years: 49.00    Pack years: 49.00    Types: Cigarettes    Last attempt to quit: 09/08/2015    Years since quitting: 2.1  . Smokeless tobacco: Never Used  . Tobacco comment: passive smoker as well  Substance and Sexual Activity  . Alcohol use: No    Alcohol/week: 0.0 standard drinks  . Drug use: No  . Sexual activity: Not on file  Lifestyle  . Physical activity:    Days per week: Not on file    Minutes per session: Not on file  . Stress: Not on file  Relationships  . Social connections:    Talks on phone: Not on file    Gets together: Not on file    Attends religious service: Not on file    Active member of club or organization: Not on file    Attends meetings of clubs or organizations: Not on file    Relationship status: Not on file  Other Topics Concern  . Not on file  Social History Narrative  . Not on file     Family History:  The patient's family history includes Diabetes in his brother and mother; Heart attack in his brother, father, and mother; Hypertension in his father.   ROS:   Please see the history of present illness.    ROS All other systems reviewed and are negative.   PHYSICAL EXAM:   VS:  BP 122/90   Pulse 69   Ht 5\' 9"  (1.753 m)   Wt 202 lb 12.8 oz (92 kg)   SpO2 95%   BMI 29.95 kg/m    GEN: Well nourished, well developed, in no acute distress  HEENT: normal  Neck: no JVD, carotid bruits, or masses Cardiac: Irregular; no murmurs, rubs, or gallops. 2+ edema  Respiratory:  clear to auscultation bilaterally, normal work of breathing GI: soft, nontender, nondistended, +  BS MS: no deformity or atrophy  Skin: warm and dry, no rash Neuro:  Alert and Oriented x 3, Strength and sensation are intact Psych: euthymic mood, full affect  Wt Readings from Last 3 Encounters:  11/17/17 202 lb 12.8 oz (92 kg)  11/03/17 201 lb (91.2 kg)  10/19/17 199 lb (90.3 kg)        Studies/Labs Reviewed:   EKG:  EKG is not ordered today.   Recent Labs: 11/10/2017: BUN 17; Creatinine, Ser 1.31; Potassium 4.0; Sodium 143   Lipid Panel    Component Value Date/Time   CHOL 137 09/25/2015 0221   TRIG 64 09/25/2015 0221   HDL 47 09/25/2015 0221   CHOLHDL 2.9 09/25/2015 0221   VLDL 13 09/25/2015 0221   LDLCALC 77 09/25/2015 0221    Additional studies/ records that were reviewed today include:   Cath 09/26/2015  Prox LAD to Mid LAD lesion, 30 %stenosed.  1st Diag lesion, 90 %stenosed.  Ost 1st Mrg to 1st Mrg lesion, 80 %stenosed.  Ost RCA to Prox RCA lesion, 30 %stenosed.  Mid RCA-2 lesion, 40 %stenosed.  Mid RCA-1 lesion, 15 %stenosed.  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  There is mild left ventricular systolic dysfunction.  The left ventricular ejection fraction is 50-55% by visual estimate.  LV end diastolic pressure is moderately elevated.  A drug eluting stent was successfully placed.  2nd Mrg lesion, 90 %stenosed.  Post intervention, there is a 0% residual stenosis.   1. 3 vessel obstructive CAD.     - chronic severe stenosis in the first diagonal    - New 90% stenosis in a large second OM    - Stents in the proximal and mid RCA are patent    - Aneurysm and stenosis in the PLOM branch of the RCA- the aneurysm is new compared to 2009 2. Mild LV dysfunction 3. Elevated LVEDP 4. Allergic response to contrast with hives and bronchospasm. 5. Successful stenting of the second OM with a Biofreedom stent.   Plan: continue IV diuresis. Patient received steroids, Benadryl, and IV pepcid for contrast reaction. Continue DAPT for one month then DC ASA. Resume Eliquis in am.     Echo 11/05/2017 LV EF: 60% -   65% Study Conclusions  - Left ventricle: The cavity size was normal. There was moderate   concentric hypertrophy. Systolic function was normal. The   estimated ejection fraction was in the range of  60% to 65%. Wall   motion was normal; there were no regional wall motion   abnormalities. The study was not technically sufficient to allow   evaluation of LV diastolic dysfunction due to atrial   fibrillation. - Aortic valve: Poorly visualized. Trileaflet; moderately   thickened, moderately calcified leaflets. - Aorta: Aortic root dimension: 40 mm (ED). Ascending aortic   diameter: 38 mm (S). - Aortic root: The aortic root was mildly dilated. - Ascending aorta: The ascending aorta was mildly dilated. - Left atrium: The atrium was moderately dilated. - Right atrium: The atrium was moderately dilated. - Pulmonic valve: There was mild regurgitation. - Pulmonary arteries: Systolic pressure could not be accurately   estimated.     ASSESSMENT:    1. Acute on chronic diastolic congestive heart failure ([place])   2. Coronary artery disease involving native coronary artery of native heart without angina pectoris   3. Chronic atrial fibrillation   4. Essential hypertension   5. Hyperlipidemia, unspecified hyperlipidemia type  6. AAA (abdominal aortic aneurysm) without rupture ([place])   7. Chronic obstructive pulmonary disease, unspecified COPD type ([place])      PLAN:  In order of problems listed above:  1. Acute on chronic diastolic heart failure: Increase Lasix to 80 mg twice daily and potassium to 40 mEq twice daily as well.  Also will start on metolazone 2.5 mg once weekly dosing.  On the day he take the metolazone he would need to take additional 40 mEq of potassium.  I will bring the patient back in 2 weeks for reassessment.  He will need a basic metabolic panel today and also next Monday  2. CAD: Denies any chest pain.  Not on aspirin given the need for Eliquis  3. Chronic atrial fibrillation: On Eliquis.  Rate controlled with metoprolol  4. Hyperlipidemia: On Lipitor 20 mg daily.  Will defer to primary care provider to recheck annual lipid panel  5. History of AAA repair: Followed  by Dr. Trula Slade of vascular surgery.   Medication Adjustments/Labs and Tests Ordered: Current medicines are reviewed at length with the patient today.  Concerns regarding medicines are outlined above.  Medication changes, Labs and Tests ordered today are listed in the Patient Instructions below. Patient Instructions  Medication Instructions:  Increase Lasix to 80 mg twice daily Increase Potassium Chloride 20 mg, take two tablets in the morning, and two tablets in the evening, on the day you take Metolazone take extra two tablets at lunch. Start Metolazone 2.5 mg ONCE WEEKLY. If you need a refill on your cardiac medications before your next appointment, please call your pharmacy.   Lab work: BMET today, and BMET next Monday If you have labs (blood work) drawn today and your tests are completely normal, you will receive your results only by: Marland Kitchen MyChart Message (if you have MyChart) OR . A paper copy in the mail If you have any lab test that is abnormal or we need to change your treatment, we will call you to review the results.  Testing/Procedures: None Ordered.  Follow-Up: At [facility], you and your health needs are our priority.  As part of our continuing mission to provide you with exceptional heart care, we have created designated Provider Care Teams.  These Care Teams include your primary Cardiologist (physician) and Advanced Practice Providers (APPs -  Physician Assistants and Nurse Practitioners) who all work together to provide you with the care you need, when you need it. . Follow Up with [name], PA in 2 weeks  Any Other Special Instructions Will Be Listed Below (If Applicable). Take metolazone 2.5 mg 30 minutes before the Lasix once weekly.      Hilbert Corrigan, [place]  11/17/2017 1:23 PM    [facility] [place], [place], [place]  [address] Phone: [phone]; Fax: [phone]

## 2017-11-23 ENCOUNTER — Other Ambulatory Visit: Payer: Self-pay

## 2017-11-23 DIAGNOSIS — I5043 Acute on chronic combined systolic (congestive) and diastolic (congestive) heart failure: Secondary | ICD-10-CM

## 2017-11-24 LAB — BASIC METABOLIC PANEL
BUN/Creatinine Ratio: 18 (ref 10–24)
BUN: 32 mg/dL — ABNORMAL HIGH (ref 8–27)
CALCIUM: 9.6 mg/dL (ref 8.6–10.2)
CHLORIDE: 94 mmol/L — AB (ref 96–106)
CO2: 27 mmol/L (ref 20–29)
Creatinine, Ser: 1.78 mg/dL — ABNORMAL HIGH (ref 0.76–1.27)
GFR calc Af Amer: 42 mL/min/{1.73_m2} — ABNORMAL LOW (ref >59)
GFR, EST NON AFRICAN AMERICAN: 36 mL/min/{1.73_m2} — AB (ref >59)
GLUCOSE: 294 mg/dL — AB (ref 65–99)
POTASSIUM: 3.3 mmol/L — AB (ref 3.5–5.2)
Sodium: 140 mmol/L (ref 134–144)

## 2017-11-25 ENCOUNTER — Telehealth: Payer: Self-pay | Admitting: Physician Assistant

## 2017-11-25 NOTE — Telephone Encounter (Signed)
Follow Up    Pt's wife returning call for nurse, states she will be leaving for church in the next 30 minutes. Please call

## 2017-11-25 NOTE — Telephone Encounter (Signed)
Informed patient of lab results; informed to call the office with any further questions or concerns. Requested that patient follow up with PCP regarding glucose management. Routed results to PCP.

## 2017-12-03 ENCOUNTER — Ambulatory Visit: Payer: Medicare Other | Admitting: Physician Assistant

## 2017-12-03 ENCOUNTER — Encounter: Payer: Self-pay | Admitting: Physician Assistant

## 2017-12-03 VITALS — BP 138/90 | HR 62 | Ht 69.0 in | Wt 199.6 lb

## 2017-12-03 DIAGNOSIS — I5032 Chronic diastolic (congestive) heart failure: Secondary | ICD-10-CM | POA: Diagnosis not present

## 2017-12-03 DIAGNOSIS — E785 Hyperlipidemia, unspecified: Secondary | ICD-10-CM | POA: Diagnosis not present

## 2017-12-03 DIAGNOSIS — N183 Chronic kidney disease, stage 3 unspecified: Secondary | ICD-10-CM

## 2017-12-03 DIAGNOSIS — I10 Essential (primary) hypertension: Secondary | ICD-10-CM | POA: Diagnosis not present

## 2017-12-03 DIAGNOSIS — I482 Chronic atrial fibrillation, unspecified: Secondary | ICD-10-CM | POA: Diagnosis not present

## 2017-12-03 DIAGNOSIS — I2510 Atherosclerotic heart disease of native coronary artery without angina pectoris: Secondary | ICD-10-CM

## 2017-12-03 DIAGNOSIS — I714 Abdominal aortic aneurysm, without rupture, unspecified: Secondary | ICD-10-CM

## 2017-12-03 DIAGNOSIS — J449 Chronic obstructive pulmonary disease, unspecified: Secondary | ICD-10-CM

## 2017-12-03 IMAGING — CT CT CTA ABD/PEL W/CM AND/OR W/O CM
3 of 10 series · 10 of 46 positions shown, 16 images · IV contrast (Omni 300)
Comparison: Prior CT scan of the abdomen and pelvis 06/18/2016

CLINICAL DATA: 75-year-old male with a history of endovascular
aortic repair in Tuesday August, 2016

EXAM:
CTA ABDOMEN AND PELVIS wITHOUT AND WITH CONTRAST
TECHNIQUE: Multidetector CT imaging of the abdomen and pelvis was performed
using the standard protocol during bolus administration of
intravenous contrast. Multiplanar reconstructed images and MIPs were
obtained and reviewed to evaluate the vascular anatomy.
CONTRAST:  100 mL Isovue 370

[Series 6: arterial · axial · arterial · 0.84mm/px · z∈[+850,+934]mm · 2 of 166 slices shown]
[im 14/166  soft-tissue]
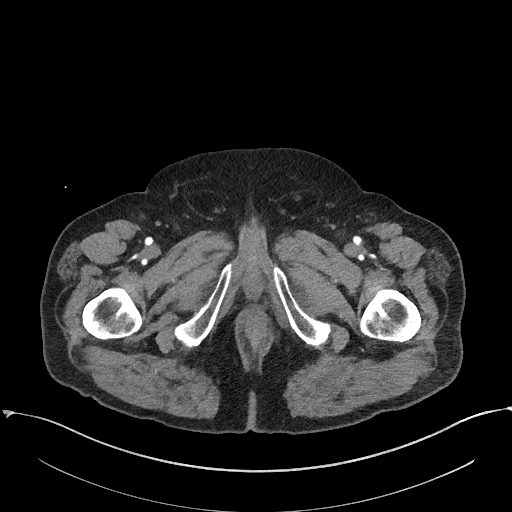
[im 42/166  soft-tissue]
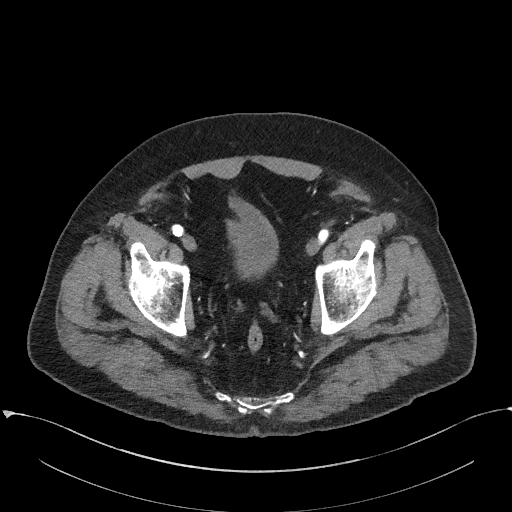

[Series 8: arterial cor · coronal · arterial · 0.81mm/px · 2 of 167 slices shown, 3 images]
[im 56/167  soft-tissue]
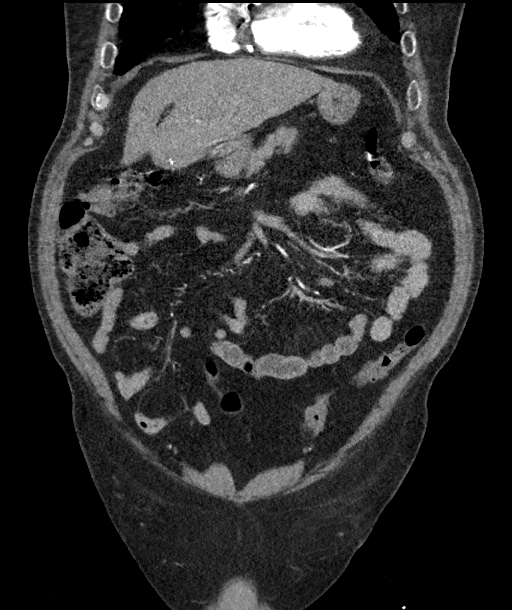
[im 56/167  bone]
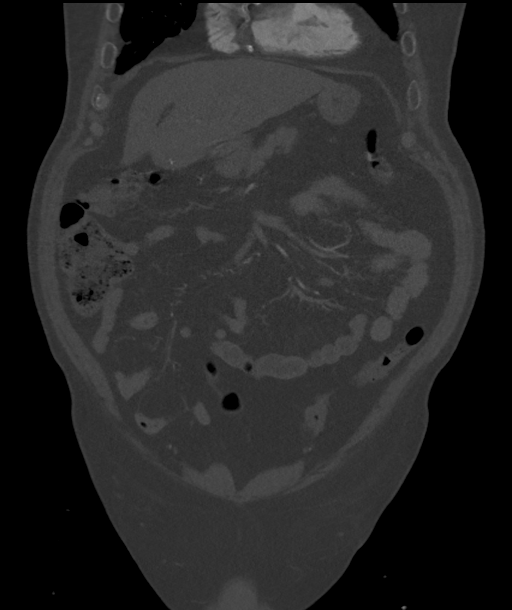
[im 111/167  soft-tissue]
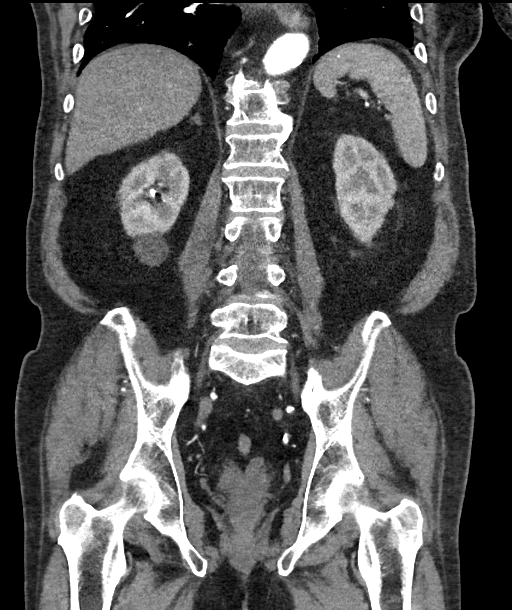

[Series 12: venous 5.0 · axial · portal-venous · 0.82mm/px · z∈[+882,+1232]mm · 6 of 100 slices shown, 11 images]
[im 15/100  soft-tissue]
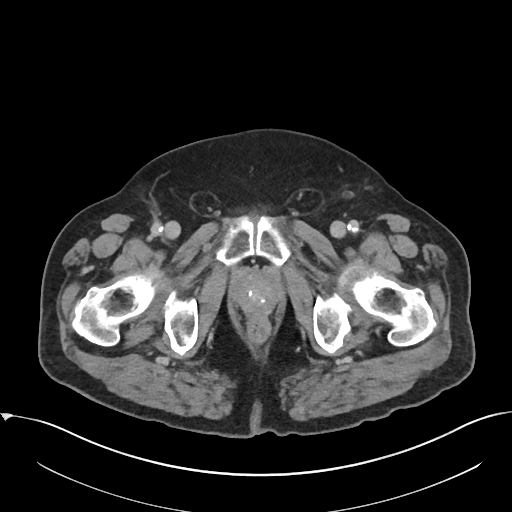
[im 15/100  bone]
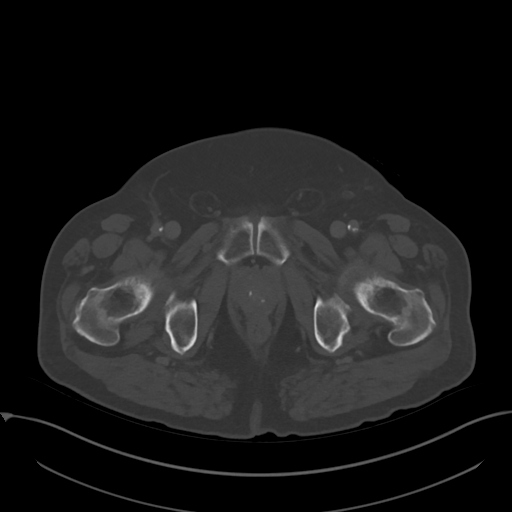
[im 29/100  soft-tissue]
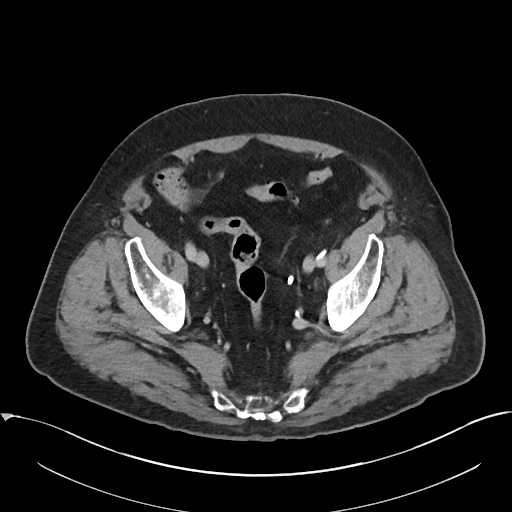
[im 43/100  soft-tissue]
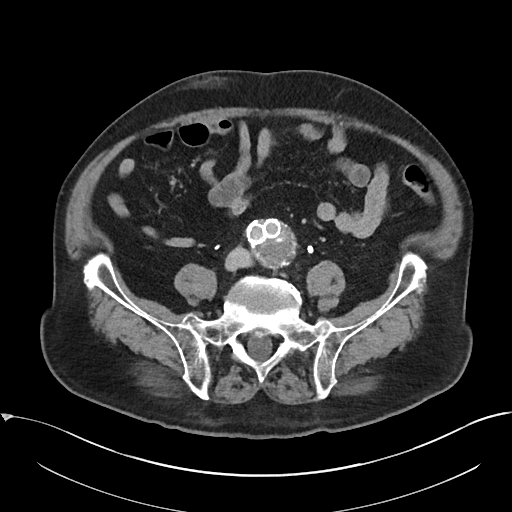
[im 43/100  lung]
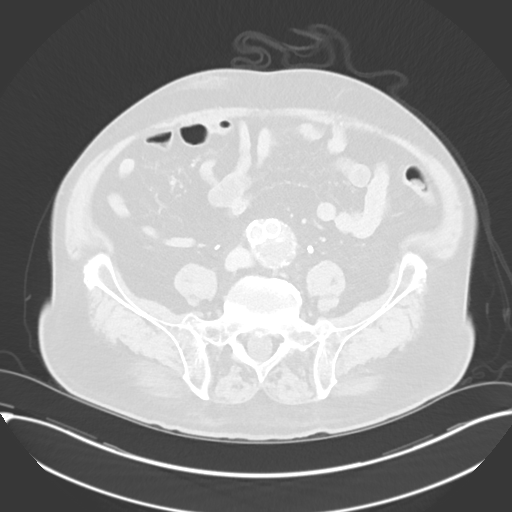
[im 57/100  soft-tissue]
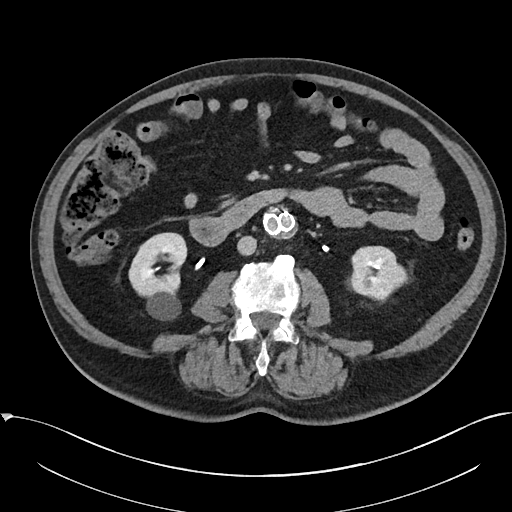
[im 57/100  lung]
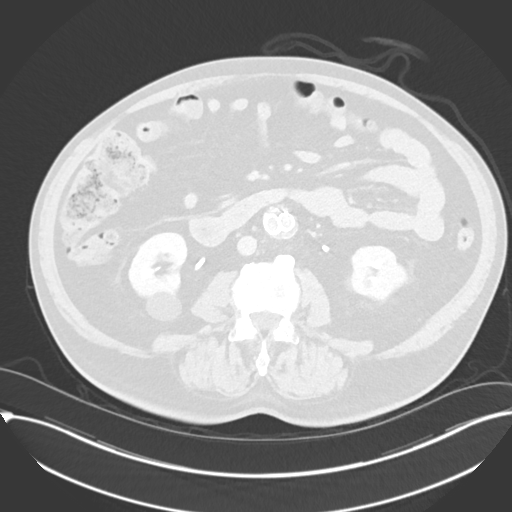
[im 71/100  soft-tissue]
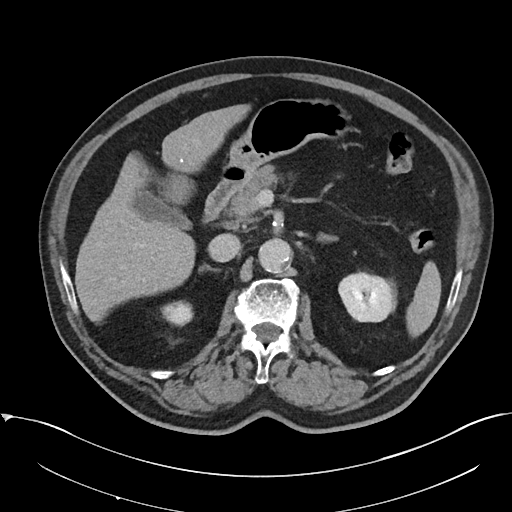
[im 71/100  lung]
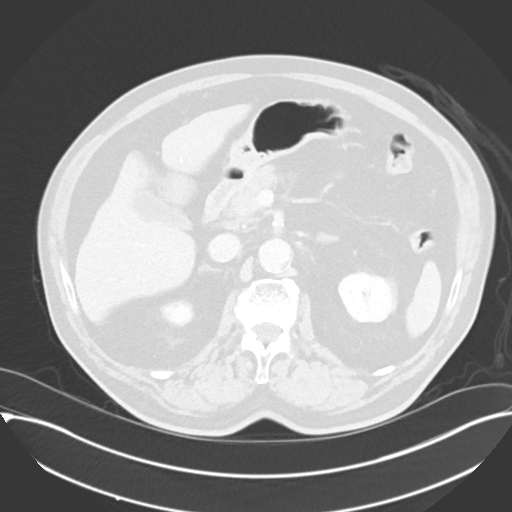
[im 85/100  soft-tissue]
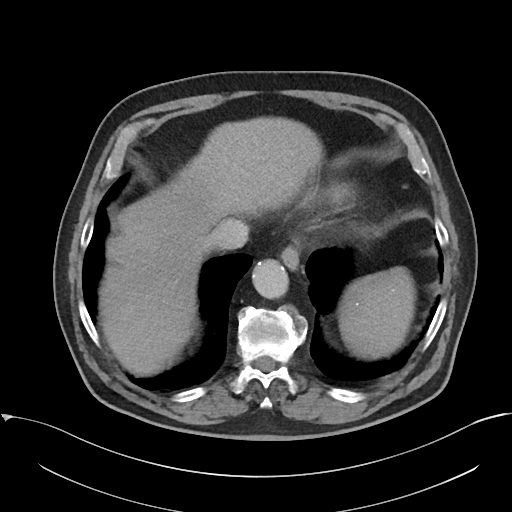
[im 85/100  lung]
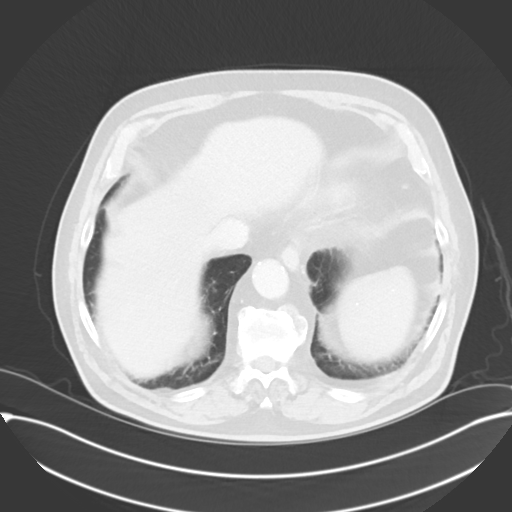

[10 of 46 positions shown; findings below may reference images not displayed]

FINDINGS: VASCULAR

Aorta: Interval surgical changes of endovascular aortic repair of a
fusiform infrarenal abdominal aortic aneurysm using a bifurcated
endoprosthesis extending from just below the renal arteries into the
bilateral common femoral arteries. There is a small amount of
contrast enhancement within the anterior aspect of the graft
surrounding the left iliac limb. This extends up toward the inferior
mesenteric artery on the delayed imaging. The excluded aneurysm sac
is stable at 5 x 5 cm. Stable ectasia of the super renal abdominal
aorta to 3.1 cm with ulcerated fibrofatty atherosclerotic plaque.

Celiac: Patent without evidence of aneurysm, dissection, vasculitis
or significant stenosis. The lateral segmental branch of the left
hepatic artery is replaced to the left gastric artery.

SMA: Patent without evidence of aneurysm, dissection, vasculitis or
significant stenosis. The right hepatic artery is replaced to the
SMA.

Renals: Solitary renal arteries bilaterally remain patent. No
evidence of fibromuscular dysplasia.

IMA: Patent without evidence of aneurysm, dissection, vasculitis or
significant stenosis.

Inflow: Scattered calcified atherosclerotic plaque without evidence
of stenosis, dissection or aneurysm.

Proximal Outflow: No evidence of complication at the bilateral
femoral artery access sites.

Veins: No focal venous abnormality.

Review of the MIP images confirms the above findings.

NON-VASCULAR

Lower chest: Mild paraseptal pulmonary emphysema. No suspicious mass
or nodule. The heart is at the upper limits of normal for size. No
pericardial effusion. Atherosclerotic calcifications present in the
coronary arteries. Unremarkable distal thoracic esophagus.

Hepatobiliary: Normal hepatic contour and morphology. No discrete
hepatic lesions. Normal appearance of the gallbladder. No intra or
extrahepatic biliary ductal dilatation.

Pancreas: Unremarkable. No pancreatic ductal dilatation or
surrounding inflammatory changes.

Spleen: Punctate calcifications in the spleen consistent with old
granulomatous disease.

Adrenals/Urinary Tract: Normal adrenal glands. Bilateral simple
renal cysts. No hydronephrosis. No ureteral abnormality. The bladder
is unremarkable.

Stomach/Bowel: No evidence of obstruction or focal bowel wall
thickening. Normal appendix in the right lower quadrant. The
terminal ileum is unremarkable.

Lymphatic: No suspicious lymphadenopathy.

Reproductive: Prostate is unremarkable.

Other: No abdominal wall hernia or abnormality. No abdominopelvic
ascites.

Musculoskeletal: No acute or significant osseous findings.
IMPRESSION: VASCULAR

1. Interval changes of endovascular aortic repair of the infrarenal
abdominal aortic aneurysm. There is evidence of an endoleak with
contrast opacification in the inferior aspect of the excluded
aneurysm sac surrounding the left iliac limb and tracking superiorly
to the inferior mesenteric artery. The inferior mesenteric artery
likely represents the outflow. I favor this to represent a type 2
endoleak, but it is difficult to completely exclude the possibility
of a type 1b endoleak originating from the left iliac limb. The
excluded aneurysm sac remains stable in size.
2. No evidence of complication at either femoral artery access site.
3. Variant hepatic artery anatomy as above.

NON-VASCULAR

1. No acute abnormality in the abdomen or pelvis.
2. Ancillary findings as above without interval change.
3. Aortic aneurysm NOS (KACRR-G9X.A). ; Aortic Atherosclerosis
(KACRR-6TN.N) and Emphysema (KACRR-YIG.V).

## 2017-12-03 NOTE — Progress Notes (Signed)
Cardiology Office Note    Date:  12/05/2017   ID:  [name], DOB Dec 22, 1940, MRN [id number]  PCP:  Marletta Lor, MD  Cardiologist:  Dr. Martinique   Chief Complaint  Patient presents with  . Follow-up    seen for Dr. Martinique.     History of Present Illness:  Jason Fisher is a 77 y.o. male with PMH of CAD (s/p BMS to RCA 2007, ISR with DES to RCA in 2009, DES to OM2 in 09/2015),chronic atrial fibrillation on Eliquis, chronic diastolic heart failure,HTN, HLD, AAA, COPD,and tobacco use. He was previously admitted in March 2018 for atrial fibrillation with RVR. Troponin was borderline elevated. He was initially treated with IV Cardizem and later switched to p.o. Cardizem prior to discharge. He also underwent AAA endovascular stent grafting by Dr. Trula Slade in June 2018. He was last seen by Dr. Martinique in July 2018 at which time he was doing well. His blood pressure was a little bit high, therefore hydralazine was added to his medical regimen. He was most recently seen by his primary care provider on 10/19/2017 for increasing ankle and pedal edema. He was instructed to increase his Lasix to 40 mg twice daily for 1 week and decreasing back down to 40 mg daily thereafter.  I last saw the patient on 11/03/2017, he continued to take 40 mg twice daily of Lasix since he saw his primary care provider.  However his symptom has not significantly improved.  On physical exam, he has bibasilar crackle and that he had 1+ pitting edema in bilateral lower extremity.  I increased his Lasix to 80 mg twice daily for 3 days before decreasing back down to 40 mg twice daily thereafter.  I repeated echocardiogram on 11/05/2017 this showed EF 60 to 65%, mildly increased aortic root measuring 40 mm, moderately dilated left and the right atrium.  Repeat blood work obtained on 11/10/2017 showed stable renal function and electrolyte.  During the last office visit, it was noted that his weight has not changed much.   I did switch him to 80 mg twice daily of Lasix with 40 mcg twice a day of potassium.  I started him on 2.5 mg weekly dosing of metolazone and instructed him to take additional 40 mEq of potassium on the day he take the metolazone.  He returns today for follow-up.  Lab work recently does show worsening renal function and low potassium.  His weight has dropped by 3 pounds, lower extremity edema has improved.  I will obtain a basic metabolic panel to reassess his renal function and electrolyte.  Otherwise he denies any chest pain, orthopnea or PND.    Past Medical History:  Diagnosis Date  . 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 BENIGN PROSTATIC HYPERTROPHY, HX OF 02/24/2007  . CAD S/P PCI    a. 2007 Inf STEMI s/p Vision BMS  2 to RCA;  b. 2009 ISR Prox RCA Stent-->with DES;  c. 09/2015 PCI: LM nl, LAD 30p/m, D1 90, LCX nl, OM1 small, 80, OM2 90 (2.5x14 Biofreedom stent), OM3 small, RCA 30p ISR, 110m ISR, 51m, 85d, EF 50-55%.  . 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 Chronic atrial fibrillation 11/06/2009   a. On Eliquis (CHA2DS2VASc = 5).  Rate controlled w/ BB and CCB.  Marland Kitchen Chronic diastolic CHF (congestive heart failure) ([place])    a. 09/2015 Echo: EF 55-60%, mildly dil LA.  . CKD (chronic kidney disease), stage III ([place])    they  mentioned this to the patient but they would work on it later after the aneurysm  . COLONIC POLYPS, HX OF 11/02/2006  . COPD (chronic obstructive pulmonary disease) ([place])   . DIABETES MELLITUS, TYPE II 10/29/2006  . Diverticulosis   . History of tobacco abuse    a. Quit 09/2015.  Marland Kitchen HYPERLIPIDEMIA 10/29/2006  . Hypertensive heart disease with heart failure ([place])   . Pneumonia 09/2015   [name] 07/31/2016  . STEMI (ST elevation myocardial infarction) [facility]) 2007   [name] 07/31/2016    Past Surgical History:  Procedure Laterality Date  . ABDOMINAL AORTIC ENDOVASCULAR STENT GRAFT N/A 07/31/2016   Procedure: ABDOMINAL AORTIC ENDOVASCULAR STENT GRAFT;  Surgeon: Serafina Mitchell, MD;  Location: [facility];  Service: Vascular;  Laterality: N/A;  . CARDIAC CATHETERIZATION N/A 09/26/2015   Procedure: Left Heart Cath and Coronary Angiography;  Surgeon: Peter M Martinique, MD;  Location: [facility];  Service: Cardiovascular;  Laterality: N/A;  . CARDIAC CATHETERIZATION N/A 09/26/2015   Procedure: Coronary Stent Intervention;  Surgeon: Peter M Martinique, MD;  Location: [facility];  Service: Cardiovascular;  Laterality: N/A;  . CATARACT EXTRACTION Bilateral   . COLONOSCOPY    . CORONARY ANGIOPLASTY WITH STENT PLACEMENT  2007   Inferior STEMI 2007 - RCA PCI with 2 Vision BMS; Abnormal Myoview in 2009 - pRCA ins-stent restenosis & unstented disease - DES PCI Promus 3.0 x 20 mm (3.5 mm)  . ENDOVASCULAR REPAIR/STENT GRAFT  07/31/2016  . INCISION AND DRAINAGE PERIRECTAL ABSCESS N/A 11/06/2015   Procedure: IRRIGATION AND DEBRIDEMENT PERIRECTAL ABSCESS, POSSIBLE HEMORRHOIDECTOMY;  Surgeon: Coralie Keens, MD;  Location: [facility];  Service: General;  Laterality: N/A;  . ORIF SHOULDER FRACTURE Right    "fell out of back end of a truck"    Current Medications: Outpatient Medications Prior to Visit  Medication Sig Dispense Refill  . ACCU-CHEK AVIVA PLUS test strip CHECK BLOOD SUGAR ONCE DAILY AS NEEDED 100 each 2  . apixaban (ELIQUIS) 5 MG TABS tablet Take 1 tablet (5 mg total) by mouth 2 (two) times daily. 60 tablet 4  . atorvastatin (LIPITOR) 20 MG tablet Take 1 tablet (20 mg total) by mouth daily. 90 tablet 4  . clopidogrel (PLAVIX) 75 MG tablet Take 1 tablet (75 mg total) by mouth daily. 90 tablet 3  . diltiazem (CARDIZEM CD) 360 MG 24 hr capsule Take 1 capsule (360 mg total) by mouth daily. 90 capsule 4  . furosemide (LASIX) 80 MG tablet Take 1 tablet (80 mg total) by mouth 2 (two) times daily. 180 tablet 1  . glipiZIDE (GLUCOTROL XL) 10 MG 24 hr tablet Take 1 tablet (10 mg total) by mouth daily with breakfast. 90 tablet 4  . Glycopyrrolate-Formoterol (BEVESPI AEROSPHERE)  9-4.8 MCG/ACT AERO Inhale 2 puffs into the lungs 2 (two) times daily. 1 Inhaler 5  . hydrALAZINE (APRESOLINE) 25 MG tablet Take 1 tablet (25 mg total) by mouth 3 (three) times daily. 270 tablet 3  . levalbuterol (XOPENEX HFA) 45 MCG/ACT inhaler Inhale 1-2 puffs into the lungs every 6 (six) hours as needed for wheezing. 1 Inhaler 5  . liraglutide (VICTOZA) 18 MG/3ML SOPN Inject 0.3 mLs (1.8 mg total) into the skin daily. 10 pen 1  . metFORMIN (GLUCOPHAGE) 1000 MG tablet TAKE 1 TABLET TWICE DAILY WITH A MEAL 180 tablet 4  . metolazone (ZAROXOLYN) 2.5 MG tablet Take 1 tablet 30 minutes before lasix dose once weekly 90 tablet 3  . metoprolol tartrate (LOPRESSOR)  100 MG tablet Take 1 tablet (100 mg total) by mouth 2 (two) times daily. 180 tablet 4  . nitroGLYCERIN (NITROSTAT) 0.4 MG SL tablet Place 1 tablet (0.4 mg total) under the tongue every 5 (five) minutes as needed for chest pain. X 3 doses 25 tablet 4  . polyethylene glycol (MIRALAX / GLYCOLAX) packet Take 17 g by mouth daily as needed for mild constipation.    . potassium chloride SA (KLOR-CON M20) 20 MEQ tablet Take 2 tablets in the morning (40mg ) and 2 tablets in the evening daily (40mg ), take an extra 2 tablets at lunch on days with metolazone 360 tablet 2  . Tiotropium Bromide-Olodaterol (STIOLTO RESPIMAT) 2.5-2.5 MCG/ACT AERS Inhale 2 puffs into the lungs daily. (Patient not taking: Reported on 12/03/2017) 2 Inhaler 4   No facility-administered medications prior to visit.      Allergies:   Contrast media [iodinated diagnostic agents]   Social History   Socioeconomic History  . Marital status: Married    Spouse name: Not on file  . Number of children: 5  . Years of education: Not on file  . Highest education level: Not on file  Occupational History  . Occupation: [employer]  Social Needs  . Financial resource strain: Not on file  . Food insecurity:    Worry: Not on file    Inability: Not on file  . Transportation needs:     Medical: Not on file    Non-medical: Not on file  Tobacco Use  . Smoking status: Former Smoker    Packs/day: 1.00    Years: 49.00    Pack years: 49.00    Types: Cigarettes    Last attempt to quit: 09/08/2015    Years since quitting: 2.2  . Smokeless tobacco: Never Used  . Tobacco comment: passive smoker as well  Substance and Sexual Activity  . Alcohol use: No    Alcohol/week: 0.0 standard drinks  . Drug use: No  . Sexual activity: Not on file  Lifestyle  . Physical activity:    Days per week: Not on file    Minutes per session: Not on file  . Stress: Not on file  Relationships  . Social connections:    Talks on phone: Not on file    Gets together: Not on file    Attends religious service: Not on file    Active member of club or organization: Not on file    Attends meetings of clubs or organizations: Not on file    Relationship status: Not on file  Other Topics Concern  . Not on file  Social History Narrative  . Not on file     Family History:  The patient's family history includes Diabetes in his brother and mother; Heart attack in his brother, father, and mother; Hypertension in his father.   ROS:   Please see the history of present illness.    ROS All other systems reviewed and are negative.   PHYSICAL EXAM:   VS:  BP 138/90   Pulse 62   Ht 5\' 9"  (1.753 m)   Wt 199 lb 9.6 oz (90.5 kg)   SpO2 95%   BMI 29.48 kg/m    GEN: Well nourished, well developed, in no acute distress  HEENT: normal  Neck: no JVD, carotid bruits, or masses Cardiac: RRR; no murmurs, rubs, or gallops. 1+ edema  Respiratory:  clear to auscultation bilaterally, normal work of breathing GI: soft, nontender, nondistended, + BS MS: no deformity or  atrophy  Skin: warm and dry, no rash Neuro:  Alert and Oriented x 3, Strength and sensation are intact Psych: euthymic mood, full affect  Wt Readings from Last 3 Encounters:  12/03/17 199 lb 9.6 oz (90.5 kg)  11/17/17 202 lb 12.8 oz (92 kg)    11/03/17 201 lb (91.2 kg)      Studies/Labs Reviewed:   EKG:  EKG is not ordered today.    Recent Labs: 12/03/2017: ALT 25; BUN 13; Creatinine, Ser 1.42; Potassium 4.3; Sodium 144   Lipid Panel    Component Value Date/Time   CHOL 161 12/03/2017 1210   TRIG 120 12/03/2017 1210   HDL 43 12/03/2017 1210   CHOLHDL 3.7 12/03/2017 1210   CHOLHDL 2.9 09/25/2015 0221   VLDL 13 09/25/2015 0221   LDLCALC 94 12/03/2017 1210    Additional studies/ records that were reviewed today include:   Cath 09/26/2015  Prox LAD to Mid LAD lesion, 30 %stenosed.  1st Diag lesion, 90 %stenosed.  Ost 1st Mrg to 1st Mrg lesion, 80 %stenosed.  Ost RCA to Prox RCA lesion, 30 %stenosed.  Mid RCA-2 lesion, 40 %stenosed.  Mid RCA-1 lesion, 15 %stenosed.  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  There is mild left ventricular systolic dysfunction.  The left ventricular ejection fraction is 50-55% by visual estimate.  LV end diastolic pressure is moderately elevated.  A drug eluting stent was successfully placed.  2nd Mrg lesion, 90 %stenosed.  Post intervention, there is a 0% residual stenosis.  1. 3 vessel obstructive CAD.  - chronic severe stenosis in the first diagonal - New 90% stenosis in a large second OM - Stents in the proximal and mid RCA are patent - Aneurysm and stenosis in the PLOM branch of the RCA- the aneurysm is new compared to 2009 2. Mild LV dysfunction 3. Elevated LVEDP 4. Allergic response to contrast with hives and bronchospasm. 5. Successful stenting of the second OM with a Biofreedom stent.   Plan: continue IV diuresis. Patient received steroids, Benadryl, and IV pepcid for contrast reaction. Continue DAPT for one month then DC ASA. Resume Eliquis in am.     Echo 11/05/2017 LV EF: 60% - 65% Study Conclusions  - Left ventricle: The cavity size was normal. There was moderate concentric hypertrophy. Systolic  function was normal. The estimated ejection fraction was in the range of 60% to 65%. Wall motion was normal; there were no regional wall motion abnormalities. The study was not technically sufficient to allow evaluation of LV diastolic dysfunction due to atrial fibrillation. - Aortic valve: Poorly visualized. Trileaflet; moderately thickened, moderately calcified leaflets. - Aorta: Aortic root dimension: 40 mm (ED). Ascending aortic diameter: 38 mm (S). - Aortic root: The aortic root was mildly dilated. - Ascending aorta: The ascending aorta was mildly dilated. - Left atrium: The atrium was moderately dilated. - Right atrium: The atrium was moderately dilated. - Pulmonic valve: There was mild regurgitation. - Pulmonary arteries: Systolic pressure could not be accurately estimated.     ASSESSMENT:    1. Chronic diastolic congestive heart failure ([place])   2. Dyslipidemia   3. Essential hypertension   4. Chronic atrial fibrillation   5. CKD (chronic kidney disease), stage III ([place])   6. Coronary artery disease involving native coronary artery of native heart without angina pectoris   7. AAA (abdominal aortic aneurysm) without rupture ([place])   8. Chronic obstructive pulmonary disease, unspecified COPD type ([place])  PLAN:  In order of problems listed above:  1. Chronic diastolic heart failure: Appears to be close to euvolemic level on physical exam.  Continue to have trace amount of lower extremity edema, however I will continue him on the current diuretic dosage.  Will obtain a basic metabolic panel given the recent worsening renal function.  He does notice significant urine output on metolazone  2. CAD: Denies any chest pain.  Not on aspirin given the need for Eliquis  3. Chronic atrial fibrillation: On Eliquis, rate controlled with metoprolol  4. Hyperlipidemia: On Lipitor 20 mg daily.  Will defer to primary care provider to check annual lipid  panel  5. History of AAA repair: Followed by Dr. Trula Slade    Medication Adjustments/Labs and Tests Ordered: Current medicines are reviewed at length with the patient today.  Concerns regarding medicines are outlined above.  Medication changes, Labs and Tests ordered today are listed in the Patient Instructions below. Patient Instructions  Medication Instructions:  Your physician recommends that you continue on your current medications as directed. Please refer to the Current Medication list given to you today.  If you need a refill on your cardiac medications before your next appointment, please call your pharmacy.   Lab work: Your physician recommends that you return for a FASTING lipid profile and CMET today.  If you have labs (blood work) drawn today and your tests are completely normal, you will receive your results only by: Marland Kitchen MyChart Message (if you have MyChart) OR . A paper copy in the mail If you have any lab test that is abnormal or we need to change your treatment, we will call you to review the results.  Testing/Procedures: none  Follow-Up: At [facility], you and your health needs are our priority.  As part of our continuing mission to provide you with exceptional heart care, we have created designated Provider Care Teams.  These Care Teams include your primary Cardiologist (physician) and Advanced Practice Providers (APPs -  Physician Assistants and Nurse Practitioners) who all work together to provide you with the care you need, when you need it. You will need a follow up appointment in 3 months with Dr. Martinique.   Advanced Practice Providers on your designated Care Team: [place], [place] . Fabian Sharp, PA-C  Any Other Special Instructions Will Be Listed Below (If Applicable). none      Hilbert Corrigan, [place]  12/05/2017 11:46 PM    [facility] [address], [place], [place]  [address] Phone: [phone]; Fax: [phone]

## 2017-12-03 NOTE — Patient Instructions (Addendum)
Medication Instructions:  Your physician recommends that you continue on your current medications as directed. Please refer to the Current Medication list given to you today.  If you need a refill on your cardiac medications before your next appointment, please call your pharmacy.   Lab work: Your physician recommends that you return for a FASTING lipid profile and CMET today.  If you have labs (blood work) drawn today and your tests are completely normal, you will receive your results only by: Marland Kitchen MyChart Message (if you have MyChart) OR . A paper copy in the mail If you have any lab test that is abnormal or we need to change your treatment, we will call you to review the results.  Testing/Procedures: none  Follow-Up: At [facility], you and your health needs are our priority.  As part of our continuing mission to provide you with exceptional heart care, we have created designated Provider Care Teams.  These Care Teams include your primary Cardiologist (physician) and Advanced Practice Providers (APPs -  Physician Assistants and Nurse Practitioners) who all work together to provide you with the care you need, when you need it. You will need a follow up appointment in 3 months with Dr. Martinique.   Advanced Practice Providers on your designated Care Team: [place], [place] . Fabian Sharp, PA-C  Any Other Special Instructions Will Be Listed Below (If Applicable). none

## 2017-12-04 LAB — LIPID PANEL
CHOL/HDL RATIO: 3.7 ratio (ref 0.0–5.0)
Cholesterol, Total: 161 mg/dL (ref 100–199)
HDL: 43 mg/dL (ref >39)
LDL Calculated: 94 mg/dL (ref 0–99)
Triglycerides: 120 mg/dL (ref 0–149)
VLDL Cholesterol Cal: 24 mg/dL (ref 5–40)

## 2017-12-04 LAB — COMPREHENSIVE METABOLIC PANEL
A/G RATIO: 1.2 (ref 1.2–2.2)
ALT: 25 IU/L (ref 0–44)
AST: 23 IU/L (ref 0–40)
Albumin: 4.2 g/dL (ref 3.5–4.8)
Alkaline Phosphatase: 98 IU/L (ref 39–117)
BILIRUBIN TOTAL: 0.6 mg/dL (ref 0.0–1.2)
BUN/Creatinine Ratio: 9 — ABNORMAL LOW (ref 10–24)
BUN: 13 mg/dL (ref 8–27)
CALCIUM: 9.2 mg/dL (ref 8.6–10.2)
CHLORIDE: 100 mmol/L (ref 96–106)
CO2: 27 mmol/L (ref 20–29)
Creatinine, Ser: 1.42 mg/dL — ABNORMAL HIGH (ref 0.76–1.27)
GFR, EST AFRICAN AMERICAN: 55 mL/min/{1.73_m2} — AB (ref >59)
GFR, EST NON AFRICAN AMERICAN: 47 mL/min/{1.73_m2} — AB (ref >59)
Globulin, Total: 3.4 g/dL (ref 1.5–4.5)
Glucose: 234 mg/dL — ABNORMAL HIGH (ref 65–99)
POTASSIUM: 4.3 mmol/L (ref 3.5–5.2)
SODIUM: 144 mmol/L (ref 134–144)
Total Protein: 7.6 g/dL (ref 6.0–8.5)

## 2017-12-05 ENCOUNTER — Encounter: Payer: Self-pay | Admitting: Physician Assistant

## 2017-12-08 NOTE — Progress Notes (Signed)
Renal function and electrolyte stable.

## 2017-12-20 NOTE — Progress Notes (Signed)
Established Patient Office Visit     CC/Reason for Visit: To establish care with me and follow-up on chronic medical conditions  HPI: Jason Fisher is a 77 y.o. male who is coming in today for above mentioned reasons. Past Medical History is significant for: Chronic diastolic heart failure, essential hypertension, type 2 diabetes, chronic atrial fibrillation, chronic kidney disease stage III, chronic diastolic heart failure.  He saw cardiology earlier this month.  At that time was noticed to have increased pedal edema as well as crackles on lung auscultation.  His Lasix was increased from 40 mg twice daily to 80 mg twice daily.  He continues to take this dose.  He has no acute complaints today.  He continues to notice pedal edema that has not worsened but remains unchanged.  Upon discussion of diet he tells me he frequently eats canned soup and vegetables and is "addicted to hotdogs".  His wife also states that he is constantly drinking water.  In regards to his atrial fibrillation he has not noticed any palpitations or chest discomfort.  He is due for maintenance labs today.   Past Medical/Surgical History: Past Medical History:  Diagnosis Date  . 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 BENIGN PROSTATIC HYPERTROPHY, HX OF 02/24/2007  . CAD S/P PCI    a. 2007 Inf STEMI s/p Vision BMS  2 to RCA;  b. 2009 ISR Prox RCA Stent-->with DES;  c. 09/2015 PCI: LM nl, LAD 30p/m, D1 90, LCX nl, OM1 small, 80, OM2 90 (2.5x14 Biofreedom stent), OM3 small, RCA 30p ISR, 49m ISR, 17m, 85d, EF 50-55%.  . 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 Chronic atrial fibrillation 11/06/2009   a. On Eliquis (CHA2DS2VASc = 5).  Rate controlled w/ BB and CCB.  Marland Kitchen Chronic diastolic CHF (congestive heart failure) ([place])    a. 09/2015 Echo: EF 55-60%, mildly dil LA.  . CKD (chronic kidney disease), stage III ([place])    they mentioned this to the patient but they would work on it later after the aneurysm  . COLONIC POLYPS,  HX OF 11/02/2006  . COPD (chronic obstructive pulmonary disease) ([place])   . DIABETES MELLITUS, TYPE II 10/29/2006  . Diverticulosis   . History of tobacco abuse    a. Quit 09/2015.  Marland Kitchen HYPERLIPIDEMIA 10/29/2006  . Hypertensive heart disease with heart failure ([place])   . Pneumonia 09/2015   [name] 07/31/2016  . STEMI (ST elevation myocardial infarction) [facility]) 2007   [name] 07/31/2016    Past Surgical History:  Procedure Laterality Date  . ABDOMINAL AORTIC ENDOVASCULAR STENT GRAFT N/A 07/31/2016   Procedure: ABDOMINAL AORTIC ENDOVASCULAR STENT GRAFT;  Surgeon: Serafina Mitchell, MD;  Location: [facility];  Service: Vascular;  Laterality: N/A;  . CARDIAC CATHETERIZATION N/A 09/26/2015   Procedure: Left Heart Cath and Coronary Angiography;  Surgeon: Peter M Martinique, MD;  Location: [facility];  Service: Cardiovascular;  Laterality: N/A;  . CARDIAC CATHETERIZATION N/A 09/26/2015   Procedure: Coronary Stent Intervention;  Surgeon: Peter M Martinique, MD;  Location: [facility];  Service: Cardiovascular;  Laterality: N/A;  . CATARACT EXTRACTION Bilateral   . COLONOSCOPY    . CORONARY ANGIOPLASTY WITH STENT PLACEMENT  2007   Inferior STEMI 2007 - RCA PCI with 2 Vision BMS; Abnormal Myoview in 2009 - pRCA ins-stent restenosis & unstented disease - DES PCI Promus 3.0 x 20 mm (3.5 mm)  . ENDOVASCULAR REPAIR/STENT GRAFT  07/31/2016  . INCISION AND DRAINAGE  PERIRECTAL ABSCESS N/A 11/06/2015   Procedure: IRRIGATION AND DEBRIDEMENT PERIRECTAL ABSCESS, POSSIBLE HEMORRHOIDECTOMY;  Surgeon: Coralie Keens, MD;  Location: [facility];  Service: General;  Laterality: N/A;  . ORIF SHOULDER FRACTURE Right    "fell out of back end of a truck"    Social History:  reports that he quit smoking about 2 years ago. His smoking use included cigarettes. He has a 49.00 pack-year smoking history. He has never used smokeless tobacco. He reports that he does not drink alcohol or use drugs.  Allergies: Allergies  Allergen  Reactions  . Contrast Media [Iodinated Diagnostic Agents] Anaphylaxis    Family History:  Family History  Problem Relation Age of Onset  . Heart attack Mother   . Diabetes Mother   . Heart attack Father   . Hypertension Father   . Heart attack Brother   . Diabetes Brother      Current Outpatient Medications:  .  ACCU-CHEK AVIVA PLUS test strip, CHECK BLOOD SUGAR ONCE DAILY AS NEEDED, Disp: 100 each, Rfl: 2 .  apixaban (ELIQUIS) 5 MG TABS tablet, Take 1 tablet (5 mg total) by mouth 2 (two) times daily., Disp: 60 tablet, Rfl: 4 .  atorvastatin (LIPITOR) 20 MG tablet, Take 1 tablet (20 mg total) by mouth daily., Disp: 90 tablet, Rfl: 4 .  clopidogrel (PLAVIX) 75 MG tablet, Take 1 tablet (75 mg total) by mouth daily., Disp: 90 tablet, Rfl: 3 .  diltiazem (CARDIZEM CD) 360 MG 24 hr capsule, Take 1 capsule (360 mg total) by mouth daily., Disp: 90 capsule, Rfl: 4 .  furosemide (LASIX) 80 MG tablet, Take 1 tablet (80 mg total) by mouth 2 (two) times daily., Disp: 180 tablet, Rfl: 1 .  glipiZIDE (GLUCOTROL XL) 10 MG 24 hr tablet, Take 1 tablet (10 mg total) by mouth daily with breakfast., Disp: 90 tablet, Rfl: 4 .  Glycopyrrolate-Formoterol (BEVESPI AEROSPHERE) 9-4.8 MCG/ACT AERO, Inhale 2 puffs into the lungs 2 (two) times daily., Disp: 1 Inhaler, Rfl: 5 .  hydrALAZINE (APRESOLINE) 25 MG tablet, Take 1 tablet (25 mg total) by mouth 3 (three) times daily., Disp: 270 tablet, Rfl: 3 .  levalbuterol (XOPENEX HFA) 45 MCG/ACT inhaler, Inhale 1-2 puffs into the lungs every 6 (six) hours as needed for wheezing., Disp: 1 Inhaler, Rfl: 5 .  liraglutide (VICTOZA) 18 MG/3ML SOPN, Inject 0.3 mLs (1.8 mg total) into the skin daily., Disp: 10 pen, Rfl: 1 .  metFORMIN (GLUCOPHAGE) 1000 MG tablet, TAKE 1 TABLET TWICE DAILY WITH A MEAL, Disp: 180 tablet, Rfl: 4 .  metolazone (ZAROXOLYN) 2.5 MG tablet, Take 1 tablet 30 minutes before lasix dose once weekly, Disp: 90 tablet, Rfl: 3 .  metoprolol tartrate  (LOPRESSOR) 100 MG tablet, Take 1 tablet (100 mg total) by mouth 2 (two) times daily., Disp: 180 tablet, Rfl: 4 .  nitroGLYCERIN (NITROSTAT) 0.4 MG SL tablet, Place 1 tablet (0.4 mg total) under the tongue every 5 (five) minutes as needed for chest pain. X 3 doses, Disp: 25 tablet, Rfl: 4 .  polyethylene glycol (MIRALAX / GLYCOLAX) packet, Take 17 g by mouth daily as needed for mild constipation., Disp: , Rfl:  .  potassium chloride SA (KLOR-CON M20) 20 MEQ tablet, Take 2 tablets in the morning (40mg ) and 2 tablets in the evening daily (40mg ), take an extra 2 tablets at lunch on days with metolazone, Disp: 360 tablet, Rfl: 2  Review of Systems:  Constitutional: Denies fever, chills, diaphoresis, appetite change and fatigue.  HEENT: Denies photophobia,  eye pain, redness, hearing loss, ear pain, congestion, sore throat, rhinorrhea, sneezing, mouth sores, trouble swallowing, neck pain, neck stiffness and tinnitus.   Respiratory: Denies SOB, DOE, cough, chest tightness,  and wheezing.   Cardiovascular: Denies chest pain, palpitations and leg swelling.  Gastrointestinal: Denies nausea, vomiting, abdominal pain, diarrhea, constipation, blood in stool and abdominal distention.  Genitourinary: Denies dysuria, urgency, frequency, hematuria, flank pain and difficulty urinating.  Endocrine: Denies: hot or cold intolerance, sweats, changes in hair or nails, polyuria, polydipsia. Musculoskeletal: Denies myalgias, back pain, joint swelling, arthralgias and gait problem.  Skin: Denies pallor, rash and wound.  Neurological: Denies dizziness, seizures, syncope, weakness, light-headedness, numbness and headaches.  Hematological: Denies adenopathy. Easy bruising, personal or family bleeding history  Psychiatric/Behavioral: Denies suicidal ideation, mood changes, confusion, nervousness, sleep disturbance and agitation    Physical Exam: Vitals:   12/21/17 0925  BP: 140/80  Pulse: 60  Temp: (!) 97.4 F (36.3 C)   TempSrc: Oral  SpO2: 97%  Weight: 199 lb 6.4 oz (90.4 kg)    Body mass index is 29.45 kg/m.   Constitutional: NAD, calm, comfortable Eyes: PERRL, lids and conjunctivae normal ENMT: Mucous membranes are moist. Posterior pharynx clear of any exudate or lesions. Normal dentition.  Neck: normal, supple, no masses, no thyromegaly Respiratory: clear to auscultation bilaterally, no wheezing, no crackles. Normal respiratory effort. No accessory muscle use.  Cardiovascular: Regular rate and rhythm, no murmurs / rubs / gallops. No extremity edema. 2+ pedal pulses. No carotid bruits.  Abdomen: no tenderness, no masses palpated. No hepatosplenomegaly. Bowel sounds positive.  Musculoskeletal: no clubbing / cyanosis. No joint deformity upper and lower extremities. Good ROM, no contractures. Normal muscle tone.  Skin: no rashes, lesions, ulcers. No induration Neurologic: CN 2-12 grossly intact. Sensation intact, DTR normal. Strength 5/5 in all 4.  Psychiatric: Normal judgment and insight. Alert and oriented x 3. Normal mood.    Impression and Plan:  DM (diabetes mellitus), type 2 with peripheral vascular complications ([place]) -Due for A1c today. -Counseled on lifestyle modifications.  Dyslipidemia -On Lipitor 20 mg daily. -LDL was 94 in November 2019. -In a patient who is diabetic with vascular disease, would like to push LDL down further. -Will consider increasing statin at next visit if not addressed with cardiology.  Essential hypertension -Uncontrolled today.  140/80 on initial check, subsequently 150/90. -He states he has not taken his medications today, also admits to being a little nervous about meeting me today. -On Cardizem CD 360 mg, Lasix 80 mg twice daily, metoprolol 100. -Will not change medications today, will recommend that he returns in 6 weeks for blood pressure check.  Chronic atrial fibrillation ([place]): CHA2DS2Vasc = 5. On Warfarin. Rate control -Anticoagulated on  Eliquis. -Currently rate-controlled.  CKD (chronic kidney disease), stage III ([place]) -Most recent creatinine was 1.4 earlier this month, recheck basic metabolic profile today to document stability of renal function on increased Lasix dose.  Chronic diastolic congestive heart failure ([facility]) -Echo from October/2019: Ejection fraction of 60 to 65%, not technically sufficient to allow evaluation of LV diastolic dysfunction due to atrial fibrillation.  No wall motion abnormalities.  Moderate concentric hypertrophy. -On metoprolol 100 mg, not on ACE inhibitor/ARB due to renal dysfunction. -Continue aspirin, statin. -Follow up with cardiology as scheduled. -Have spent a considerable amount of time at today's visit discussing diet modification, I believe this is likely the cause for his increased pedal edema.  Discussed water, sodium limits.    Patient Instructions  BEFORE YOU LEAVE: -  Labs today  -Follow up: 6 weeks for blood pressure check  We have ordered labs or studies at this visit. It can take up to 1-2 weeks for results and processing. IF results require follow up or explanation, we will call you with instructions. Clinically stable results will be released to your [facility]. If you have not heard from [place] or cannot find your results in [facility] in 2 weeks please contact our office at [phone].  If you are not yet signed up for [facility], please consider signing up.    DASH Eating Plan DASH stands for "Dietary Approaches to Stop Hypertension." The DASH eating plan is a healthy eating plan that has been shown to reduce high blood pressure (hypertension). It may also reduce your risk for type 2 diabetes, heart disease, and stroke. The DASH eating plan may also help with weight loss. What are tips for following this plan? General guidelines  Avoid eating more than 2,000 mg (milligrams) of salt (sodium) a day. If you have hypertension, you may need to reduce your sodium intake to 1,500 mg a  day.  Limit alcohol intake to no more than 1 drink a day for nonpregnant women and 2 drinks a day for men. One drink equals 12 oz of beer, 5 oz of wine, or 1 oz of hard liquor.  Work with your health care provider to maintain a healthy body weight or to lose weight. Ask what an ideal weight is for you.  Get at least 30 minutes of exercise that causes your heart to beat faster (aerobic exercise) most days of the week. Activities may include walking, swimming, or biking.  Work with your health care provider or diet and nutrition specialist (dietitian) to adjust your eating plan to your individual calorie needs. Reading food labels  Check food labels for the amount of sodium per serving. Choose foods with less than 5 percent of the Daily Value of sodium. Generally, foods with less than 300 mg of sodium per serving fit into this eating plan.  To find whole grains, look for the word "whole" as the first word in the ingredient list. Shopping  Buy products labeled as "low-sodium" or "no salt added."  Buy fresh foods. Avoid canned foods and premade or frozen meals. Cooking  Avoid adding salt when cooking. Use salt-free seasonings or herbs instead of table salt or sea salt. Check with your health care provider or pharmacist before using salt substitutes.  Do not fry foods. Cook foods using healthy methods such as baking, boiling, grilling, and broiling instead.  Cook with heart-healthy oils, such as olive, canola, soybean, or sunflower oil. Meal planning   Eat a balanced diet that includes: ? 5 or more servings of fruits and vegetables each day. At each meal, try to fill half of your plate with fruits and vegetables. ? Up to 6-8 servings of whole grains each day. ? Less than 6 oz of lean meat, poultry, or fish each day. A 3-oz serving of meat is about the same size as a deck of cards. One egg equals 1 oz. ? 2 servings of low-fat dairy each day. ? A serving of nuts, seeds, or beans 5 times  each week. ? Heart-healthy fats. Healthy fats called Omega-3 fatty acids are found in foods such as flaxseeds and coldwater fish, like sardines, salmon, and mackerel.  Limit how much you eat of the following: ? Canned or prepackaged foods. ? Food that is high in trans fat, such as fried foods. ?  Food that is high in saturated fat, such as fatty meat. ? Sweets, desserts, sugary drinks, and other foods with added sugar. ? Full-fat dairy products.  Do not salt foods before eating.  Try to eat at least 2 vegetarian meals each week.  Eat more home-cooked food and less restaurant, buffet, and fast food.  When eating at a restaurant, ask that your food be prepared with less salt or no salt, if possible. What foods are recommended? The items listed may not be a complete list. Talk with your dietitian about what dietary choices are best for you. Grains Whole-grain or whole-wheat bread. Whole-grain or whole-wheat pasta. Brown rice. [place]. Bulgur. Whole-grain and low-sodium cereals. Pita bread. Low-fat, low-sodium crackers. Whole-wheat flour tortillas. Vegetables Fresh or frozen vegetables (raw, steamed, roasted, or grilled). Low-sodium or reduced-sodium tomato and vegetable juice. Low-sodium or reduced-sodium tomato sauce and tomato paste. Low-sodium or reduced-sodium canned vegetables. Fruits All fresh, dried, or frozen fruit. Canned fruit in natural juice (without added sugar). Meat and other protein foods Skinless chicken or [place]. Ground chicken or [place]. Pork with fat trimmed off. Fish and seafood. Egg whites. Dried beans, peas, or lentils. Unsalted nuts, nut butters, and seeds. Unsalted canned beans. Lean cuts of beef with fat trimmed off. Low-sodium, lean deli meat. Dairy Low-fat (1%) or fat-free (skim) milk. Fat-free, low-fat, or reduced-fat cheeses. Nonfat, low-sodium ricotta or cottage cheese. Low-fat or nonfat yogurt. Low-fat, low-sodium cheese. Fats and oils Soft margarine  without trans fats. Vegetable oil. Low-fat, reduced-fat, or light mayonnaise and salad dressings (reduced-sodium). Canola, safflower, olive, soybean, and sunflower oils. Avocado. Seasoning and other foods Herbs. Spices. Seasoning mixes without salt. Unsalted popcorn and pretzels. Fat-free sweets. What foods are not recommended? The items listed may not be a complete list. Talk with your dietitian about what dietary choices are best for you. Grains Baked goods made with fat, such as croissants, muffins, or some breads. Dry pasta or rice meal packs. Vegetables Creamed or fried vegetables. Vegetables in a cheese sauce. Regular canned vegetables (not low-sodium or reduced-sodium). Regular canned tomato sauce and paste (not low-sodium or reduced-sodium). Regular tomato and vegetable juice (not low-sodium or reduced-sodium). Angie Fava. Olives. Fruits Canned fruit in a light or heavy syrup. Fried fruit. Fruit in cream or butter sauce. Meat and other protein foods Fatty cuts of meat. Ribs. Fried meat. Berniece Salines. Sausage. Bologna and other processed lunch meats. Salami. Fatback. Hotdogs. Bratwurst. Salted nuts and seeds. Canned beans with added salt. Canned or smoked fish. Whole eggs or egg yolks. Chicken or [place] with skin. Dairy Whole or 2% milk, cream, and half-and-half. Whole or full-fat cream cheese. Whole-fat or sweetened yogurt. Full-fat cheese. Nondairy creamers. Whipped toppings. Processed cheese and cheese spreads. Fats and oils Butter. Stick margarine. Lard. Shortening. Ghee. Bacon fat. Tropical oils, such as coconut, palm kernel, or palm oil. Seasoning and other foods Salted popcorn and pretzels. Onion salt, garlic salt, seasoned salt, table salt, and sea salt. Worcestershire sauce. Tartar sauce. Barbecue sauce. Teriyaki sauce. Soy sauce, including reduced-sodium. Steak sauce. Canned and packaged gravies. Fish sauce. Oyster sauce. Cocktail sauce. Horseradish that you find on the shelf. Ketchup.  Mustard. Meat flavorings and tenderizers. Bouillon cubes. Hot sauce and Tabasco sauce. Premade or packaged marinades. Premade or packaged taco seasonings. Relishes. Regular salad dressings. Where to find more information:  National Heart, Lung, and [place]: https://wilson-eaton.com/  American Heart Association: www.heart.org Summary  The DASH eating plan is a healthy eating plan that has been shown to reduce high blood  pressure (hypertension). It may also reduce your risk for type 2 diabetes, heart disease, and stroke.  With the DASH eating plan, you should limit salt (sodium) intake to 2,300 mg a day. If you have hypertension, you may need to reduce your sodium intake to 1,500 mg a day.  When on the DASH eating plan, aim to eat more fresh fruits and vegetables, whole grains, lean proteins, low-fat dairy, and heart-healthy fats.  Work with your health care provider or diet and nutrition specialist (dietitian) to adjust your eating plan to your individual calorie needs. This information is not intended to replace advice given to you by your health care provider. Make sure you discuss any questions you have with your health care provider. Document Released: 01/08/2011 Document Revised: 01/13/2016 Document Reviewed: 01/13/2016 Elsevier Interactive Patient Education  2018 [place], [place] Jacklynn Ganong

## 2017-12-21 ENCOUNTER — Ambulatory Visit (INDEPENDENT_AMBULATORY_CARE_PROVIDER_SITE_OTHER): Payer: Medicare Other | Admitting: Internal Medicine

## 2017-12-21 ENCOUNTER — Encounter: Payer: Self-pay | Admitting: Internal Medicine

## 2017-12-21 VITALS — BP 140/80 | HR 60 | Temp 97.4°F | Wt 199.4 lb

## 2017-12-21 DIAGNOSIS — N183 Chronic kidney disease, stage 3 unspecified: Secondary | ICD-10-CM

## 2017-12-21 DIAGNOSIS — I5032 Chronic diastolic (congestive) heart failure: Secondary | ICD-10-CM

## 2017-12-21 DIAGNOSIS — I10 Essential (primary) hypertension: Secondary | ICD-10-CM

## 2017-12-21 DIAGNOSIS — E1151 Type 2 diabetes mellitus with diabetic peripheral angiopathy without gangrene: Secondary | ICD-10-CM

## 2017-12-21 DIAGNOSIS — E785 Hyperlipidemia, unspecified: Secondary | ICD-10-CM | POA: Diagnosis not present

## 2017-12-21 DIAGNOSIS — I130 Hypertensive heart and chronic kidney disease with heart failure and stage 1 through stage 4 chronic kidney disease, or unspecified chronic kidney disease: Secondary | ICD-10-CM

## 2017-12-21 DIAGNOSIS — I482 Chronic atrial fibrillation, unspecified: Secondary | ICD-10-CM

## 2017-12-21 LAB — BASIC METABOLIC PANEL
BUN: 17 mg/dL (ref 6–23)
CALCIUM: 9.3 mg/dL (ref 8.4–10.5)
CHLORIDE: 101 meq/L (ref 96–112)
CO2: 28 meq/L (ref 19–32)
Creatinine, Ser: 1.35 mg/dL (ref 0.40–1.50)
GFR: 54.45 mL/min — ABNORMAL LOW (ref >60.00)
Glucose, Bld: 209 mg/dL — ABNORMAL HIGH (ref 70–99)
Potassium: 3.6 mEq/L (ref 3.5–5.1)
SODIUM: 139 meq/L (ref 135–145)

## 2017-12-21 LAB — HEMOGLOBIN A1C: Hgb A1c MFr Bld: 9.5 % — ABNORMAL HIGH (ref 4.6–6.5)

## 2017-12-21 NOTE — Patient Instructions (Signed)
BEFORE YOU LEAVE: -Labs today  -Follow up: 6 weeks for blood pressure check  We have ordered labs or studies at this visit. It can take up to 1-2 weeks for results and processing. IF results require follow up or explanation, we will call you with instructions. Clinically stable results will be released to your [facility]. If you have not heard from [place] or cannot find your results in [facility] in 2 weeks please contact our office at [phone].  If you are not yet signed up for [facility], please consider signing up.    DASH Eating Plan DASH stands for "Dietary Approaches to Stop Hypertension." The DASH eating plan is a healthy eating plan that has been shown to reduce high blood pressure (hypertension). It may also reduce your risk for type 2 diabetes, heart disease, and stroke. The DASH eating plan may also help with weight loss. What are tips for following this plan? General guidelines  Avoid eating more than 2,000 mg (milligrams) of salt (sodium) a day. If you have hypertension, you may need to reduce your sodium intake to 1,500 mg a day.  Limit alcohol intake to no more than 1 drink a day for nonpregnant women and 2 drinks a day for men. One drink equals 12 oz of beer, 5 oz of wine, or 1 oz of hard liquor.  Work with your health care provider to maintain a healthy body weight or to lose weight. Ask what an ideal weight is for you.  Get at least 30 minutes of exercise that causes your heart to beat faster (aerobic exercise) most days of the week. Activities may include walking, swimming, or biking.  Work with your health care provider or diet and nutrition specialist (dietitian) to adjust your eating plan to your individual calorie needs. Reading food labels  Check food labels for the amount of sodium per serving. Choose foods with less than 5 percent of the Daily Value of sodium. Generally, foods with less than 300 mg of sodium per serving fit into this eating plan.  To find whole grains,  look for the word "whole" as the first word in the ingredient list. Shopping  Buy products labeled as "low-sodium" or "no salt added."  Buy fresh foods. Avoid canned foods and premade or frozen meals. Cooking  Avoid adding salt when cooking. Use salt-free seasonings or herbs instead of table salt or sea salt. Check with your health care provider or pharmacist before using salt substitutes.  Do not fry foods. Cook foods using healthy methods such as baking, boiling, grilling, and broiling instead.  Cook with heart-healthy oils, such as olive, canola, soybean, or sunflower oil. Meal planning   Eat a balanced diet that includes: ? 5 or more servings of fruits and vegetables each day. At each meal, try to fill half of your plate with fruits and vegetables. ? Up to 6-8 servings of whole grains each day. ? Less than 6 oz of lean meat, poultry, or fish each day. A 3-oz serving of meat is about the same size as a deck of cards. One egg equals 1 oz. ? 2 servings of low-fat dairy each day. ? A serving of nuts, seeds, or beans 5 times each week. ? Heart-healthy fats. Healthy fats called Omega-3 fatty acids are found in foods such as flaxseeds and coldwater fish, like sardines, salmon, and mackerel.  Limit how much you eat of the following: ? Canned or prepackaged foods. ? Food that is high in trans fat, such as fried  foods. ? Food that is high in saturated fat, such as fatty meat. ? Sweets, desserts, sugary drinks, and other foods with added sugar. ? Full-fat dairy products.  Do not salt foods before eating.  Try to eat at least 2 vegetarian meals each week.  Eat more home-cooked food and less restaurant, buffet, and fast food.  When eating at a restaurant, ask that your food be prepared with less salt or no salt, if possible. What foods are recommended? The items listed may not be a complete list. Talk with your dietitian about what dietary choices are best for you. Grains Whole-grain  or whole-wheat bread. Whole-grain or whole-wheat pasta. Brown rice. [place]. Bulgur. Whole-grain and low-sodium cereals. Pita bread. Low-fat, low-sodium crackers. Whole-wheat flour tortillas. Vegetables Fresh or frozen vegetables (raw, steamed, roasted, or grilled). Low-sodium or reduced-sodium tomato and vegetable juice. Low-sodium or reduced-sodium tomato sauce and tomato paste. Low-sodium or reduced-sodium canned vegetables. Fruits All fresh, dried, or frozen fruit. Canned fruit in natural juice (without added sugar). Meat and other protein foods Skinless chicken or [place]. Ground chicken or [place]. Pork with fat trimmed off. Fish and seafood. Egg whites. Dried beans, peas, or lentils. Unsalted nuts, nut butters, and seeds. Unsalted canned beans. Lean cuts of beef with fat trimmed off. Low-sodium, lean deli meat. Dairy Low-fat (1%) or fat-free (skim) milk. Fat-free, low-fat, or reduced-fat cheeses. Nonfat, low-sodium ricotta or cottage cheese. Low-fat or nonfat yogurt. Low-fat, low-sodium cheese. Fats and oils Soft margarine without trans fats. Vegetable oil. Low-fat, reduced-fat, or light mayonnaise and salad dressings (reduced-sodium). Canola, safflower, olive, soybean, and sunflower oils. Avocado. Seasoning and other foods Herbs. Spices. Seasoning mixes without salt. Unsalted popcorn and pretzels. Fat-free sweets. What foods are not recommended? The items listed may not be a complete list. Talk with your dietitian about what dietary choices are best for you. Grains Baked goods made with fat, such as croissants, muffins, or some breads. Dry pasta or rice meal packs. Vegetables Creamed or fried vegetables. Vegetables in a cheese sauce. Regular canned vegetables (not low-sodium or reduced-sodium). Regular canned tomato sauce and paste (not low-sodium or reduced-sodium). Regular tomato and vegetable juice (not low-sodium or reduced-sodium). Angie Fava. Olives. Fruits Canned fruit in a  light or heavy syrup. Fried fruit. Fruit in cream or butter sauce. Meat and other protein foods Fatty cuts of meat. Ribs. Fried meat. Berniece Salines. Sausage. Bologna and other processed lunch meats. Salami. Fatback. Hotdogs. Bratwurst. Salted nuts and seeds. Canned beans with added salt. Canned or smoked fish. Whole eggs or egg yolks. Chicken or [place] with skin. Dairy Whole or 2% milk, cream, and half-and-half. Whole or full-fat cream cheese. Whole-fat or sweetened yogurt. Full-fat cheese. Nondairy creamers. Whipped toppings. Processed cheese and cheese spreads. Fats and oils Butter. Stick margarine. Lard. Shortening. Ghee. Bacon fat. Tropical oils, such as coconut, palm kernel, or palm oil. Seasoning and other foods Salted popcorn and pretzels. Onion salt, garlic salt, seasoned salt, table salt, and sea salt. Worcestershire sauce. Tartar sauce. Barbecue sauce. Teriyaki sauce. Soy sauce, including reduced-sodium. Steak sauce. Canned and packaged gravies. Fish sauce. Oyster sauce. Cocktail sauce. Horseradish that you find on the shelf. Ketchup. Mustard. Meat flavorings and tenderizers. Bouillon cubes. Hot sauce and Tabasco sauce. Premade or packaged marinades. Premade or packaged taco seasonings. Relishes. Regular salad dressings. Where to find more information:  National Heart, Lung, and [place]: https://wilson-eaton.com/  American Heart Association: www.heart.org Summary  The DASH eating plan is a healthy eating plan that has been shown to reduce  high blood pressure (hypertension). It may also reduce your risk for type 2 diabetes, heart disease, and stroke.  With the DASH eating plan, you should limit salt (sodium) intake to 2,300 mg a day. If you have hypertension, you may need to reduce your sodium intake to 1,500 mg a day.  When on the DASH eating plan, aim to eat more fresh fruits and vegetables, whole grains, lean proteins, low-fat dairy, and heart-healthy fats.  Work with your health care  provider or diet and nutrition specialist (dietitian) to adjust your eating plan to your individual calorie needs. This information is not intended to replace advice given to you by your health care provider. Make sure you discuss any questions you have with your health care provider. Document Released: 01/08/2011 Document Revised: 01/13/2016 Document Reviewed: 01/13/2016 Elsevier Interactive Patient Education  [employer].

## 2017-12-27 ENCOUNTER — Other Ambulatory Visit: Payer: Self-pay

## 2017-12-27 NOTE — Patient Outreach (Signed)
[facility]) Care Management  12/27/2017  [name] 09/05/1940 [id number]   Medication Adherence call to Mr. Jason Fisher spoke with patient's wife she said patient have medication At this time and will order when they need it patient is due on Atorvastatin 20 mg ,Glipizide ER 10 mg and Metformin 1000 mg. Jason Fisher is showing past due under [place].   [facility] Direct Dial [phone]  Fax [phone] [name]

## 2017-12-31 ENCOUNTER — Other Ambulatory Visit: Payer: Self-pay | Admitting: Internal Medicine

## 2018-01-03 ENCOUNTER — Other Ambulatory Visit: Payer: Self-pay | Admitting: Internal Medicine

## 2018-01-14 ENCOUNTER — Encounter: Payer: Self-pay | Admitting: Internal Medicine

## 2018-01-14 ENCOUNTER — Ambulatory Visit (INDEPENDENT_AMBULATORY_CARE_PROVIDER_SITE_OTHER): Payer: Medicare Other | Admitting: Internal Medicine

## 2018-01-14 VITALS — BP 130/84 | HR 72 | Temp 97.4°F | Wt 196.7 lb

## 2018-01-14 DIAGNOSIS — E1151 Type 2 diabetes mellitus with diabetic peripheral angiopathy without gangrene: Secondary | ICD-10-CM

## 2018-01-14 MED ORDER — BASAGLAR KWIKPEN 100 UNIT/ML ~~LOC~~ SOPN: 8.0000 [IU] | PEN_INJECTOR | Freq: Every day | SUBCUTANEOUS | 3 refills | Status: DC

## 2018-01-14 NOTE — Progress Notes (Signed)
Established Patient Office Visit     CC/Reason for Visit: Follow up elevated A1C  HPI: Jason Fisher is a 77 y.o. male who is coming in today for the above mentioned reasons. He was seen last week and his A1C had significantly increased to 9.5. He was asked to return to discuss management options. He is already on maximal doses of metformin, glipizide and victoza. He just filled his last victoza pen. His wife states that his CBGs are never below 230 and sometimes as high as 400.  Past Medical/Surgical History: Past Medical History:  Diagnosis Date  . 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 BENIGN PROSTATIC HYPERTROPHY, HX OF 02/24/2007  . CAD S/P PCI    a. 2007 Inf STEMI s/p Vision BMS  2 to RCA;  b. 2009 ISR Prox RCA Stent-->with DES;  c. 09/2015 PCI: LM nl, LAD 30p/m, D1 90, LCX nl, OM1 small, 80, OM2 90 (2.5x14 Biofreedom stent), OM3 small, RCA 30p ISR, 11m ISR, 78m, 85d, EF 50-55%.  . 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 Chronic atrial fibrillation 11/06/2009   a. On Eliquis (CHA2DS2VASc = 5).  Rate controlled w/ BB and CCB.  Marland Kitchen Chronic diastolic CHF (congestive heart failure) ([place])    a. 09/2015 Echo: EF 55-60%, mildly dil LA.  . CKD (chronic kidney disease), stage III ([place])    they mentioned this to the patient but they would work on it later after the aneurysm  . COLONIC POLYPS, HX OF 11/02/2006  . COPD (chronic obstructive pulmonary disease) ([place])   . DIABETES MELLITUS, TYPE II 10/29/2006  . Diverticulosis   . History of tobacco abuse    a. Quit 09/2015.  Marland Kitchen HYPERLIPIDEMIA 10/29/2006  . Hypertensive heart disease with heart failure ([place])   . Pneumonia 09/2015   [name] 07/31/2016  . STEMI (ST elevation myocardial infarction) [facility]) 2007   [name] 07/31/2016    Past Surgical History:  Procedure Laterality Date  . ABDOMINAL AORTIC ENDOVASCULAR STENT GRAFT N/A 07/31/2016   Procedure: ABDOMINAL AORTIC ENDOVASCULAR STENT GRAFT;  Surgeon: Serafina Mitchell, MD;  Location: [place];   Service: Vascular;  Laterality: N/A;  . CARDIAC CATHETERIZATION N/A 09/26/2015   Procedure: Left Heart Cath and Coronary Angiography;  Surgeon: Peter M Martinique, MD;  Location: [place] CV LAB;  Service: Cardiovascular;  Laterality: N/A;  . CARDIAC CATHETERIZATION N/A 09/26/2015   Procedure: Coronary Stent Intervention;  Surgeon: Peter M Martinique, MD;  Location: [facility] CV LAB;  Service: Cardiovascular;  Laterality: N/A;  . CATARACT EXTRACTION Bilateral   . COLONOSCOPY    . CORONARY ANGIOPLASTY WITH STENT PLACEMENT  2007   Inferior STEMI 2007 - RCA PCI with 2 Vision BMS; Abnormal Myoview in 2009 - pRCA ins-stent restenosis & unstented disease - DES PCI Promus 3.0 x 20 mm (3.5 mm)  . ENDOVASCULAR REPAIR/STENT GRAFT  07/31/2016  . INCISION AND DRAINAGE PERIRECTAL ABSCESS N/A 11/06/2015   Procedure: IRRIGATION AND DEBRIDEMENT PERIRECTAL ABSCESS, POSSIBLE HEMORRHOIDECTOMY;  Surgeon: Coralie Keens, MD;  Location: [place];  Service: General;  Laterality: N/A;  . ORIF SHOULDER FRACTURE Right    "fell out of back end of a truck"    Social History:  reports that he quit smoking about 2 years ago. His smoking use included cigarettes. He has a 49.00 pack-year smoking history. He has never used smokeless tobacco. He reports that he does not drink alcohol or use drugs.  Allergies: Allergies  Allergen Reactions  . Contrast Media [Iodinated  Diagnostic Agents] Anaphylaxis    Family History:  Family History  Problem Relation Age of Onset  . Heart attack Mother   . Diabetes Mother   . Heart attack Father   . Hypertension Father   . Heart attack Brother   . Diabetes Brother      Current Outpatient Medications:  .  ACCU-CHEK AVIVA PLUS test strip, CHECK BLOOD SUGAR ONCE DAILY AS NEEDED, Disp: 100 each, Rfl: 2 .  apixaban (ELIQUIS) 5 MG TABS tablet, Take 1 tablet (5 mg total) by mouth 2 (two) times daily., Disp: 60 tablet, Rfl: 4 .  atorvastatin (LIPITOR) 20 MG tablet, Take 1 tablet (20 mg  total) by mouth daily., Disp: 90 tablet, Rfl: 4 .  clopidogrel (PLAVIX) 75 MG tablet, Take 1 tablet (75 mg total) by mouth daily., Disp: 90 tablet, Rfl: 3 .  diltiazem (CARDIZEM CD) 360 MG 24 hr capsule, Take 1 capsule (360 mg total) by mouth daily., Disp: 90 capsule, Rfl: 4 .  furosemide (LASIX) 80 MG tablet, Take 1 tablet (80 mg total) by mouth 2 (two) times daily., Disp: 180 tablet, Rfl: 1 .  Glycopyrrolate-Formoterol (BEVESPI AEROSPHERE) 9-4.8 MCG/ACT AERO, Inhale 2 puffs into the lungs 2 (two) times daily., Disp: 1 Inhaler, Rfl: 5 .  hydrALAZINE (APRESOLINE) 25 MG tablet, Take 1 tablet (25 mg total) by mouth 3 (three) times daily., Disp: 270 tablet, Rfl: 3 .  levalbuterol (XOPENEX HFA) 45 MCG/ACT inhaler, Inhale 1-2 puffs into the lungs every 6 (six) hours as needed for wheezing., Disp: 1 Inhaler, Rfl: 5 .  liraglutide (VICTOZA) 18 MG/3ML SOPN, Inject 0.3 mLs (1.8 mg total) into the skin daily., Disp: 10 pen, Rfl: 1 .  metFORMIN (GLUCOPHAGE) 1000 MG tablet, TAKE 1 TABLET TWICE DAILY WITH A MEAL, Disp: 180 tablet, Rfl: 4 .  metolazone (ZAROXOLYN) 2.5 MG tablet, Take 1 tablet 30 minutes before lasix dose once weekly, Disp: 90 tablet, Rfl: 3 .  metoprolol tartrate (LOPRESSOR) 100 MG tablet, Take 1 tablet (100 mg total) by mouth 2 (two) times daily., Disp: 180 tablet, Rfl: 4 .  nitroGLYCERIN (NITROSTAT) 0.4 MG SL tablet, Place 1 tablet (0.4 mg total) under the tongue every 5 (five) minutes as needed for chest pain. X 3 doses, Disp: 25 tablet, Rfl: 4 .  polyethylene glycol (MIRALAX / GLYCOLAX) packet, Take 17 g by mouth daily as needed for mild constipation., Disp: , Rfl:  .  potassium chloride SA (KLOR-CON M20) 20 MEQ tablet, Take 2 tablets in the morning (40mg ) and 2 tablets in the evening daily (40mg ), take an extra 2 tablets at lunch on days with metolazone, Disp: 360 tablet, Rfl: 2 .  Insulin Glargine (BASAGLAR KWIKPEN) 100 UNIT/ML SOPN, Inject 0.08 mLs (8 Units total) into the skin at bedtime.,  Disp: 5 pen, Rfl: 3  Review of Systems:  Constitutional: Denies fever, chills, diaphoresis, appetite change and fatigue.  HEENT: Denies photophobia, eye pain, redness, hearing loss, ear pain, congestion, sore throat, rhinorrhea, sneezing, mouth sores, trouble swallowing, neck pain, neck stiffness and tinnitus.   Respiratory: Denies SOB, DOE, cough, chest tightness,  and wheezing.   Cardiovascular: Denies chest pain, palpitations and leg swelling.  Gastrointestinal: Denies nausea, vomiting, abdominal pain, diarrhea, constipation, blood in stool and abdominal distention.  Genitourinary: Denies dysuria, urgency, frequency, hematuria, flank pain and difficulty urinating.  Endocrine: Denies: hot or cold intolerance, sweats, changes in hair or nails, polyuria, polydipsia. Musculoskeletal: Denies myalgias, back pain, joint swelling, arthralgias and gait problem.  Skin: Denies pallor, rash  and wound.  Neurological: Denies dizziness, seizures, syncope, weakness, light-headedness, numbness and headaches.  Hematological: Denies adenopathy. Easy bruising, personal or family bleeding history  Psychiatric/Behavioral: Denies suicidal ideation, mood changes, confusion, nervousness, sleep disturbance and agitation    Physical Exam: Vitals:   01/14/18 1553  BP: 130/84  Pulse: 72  Temp: (!) 97.4 F (36.3 C)  TempSrc: Oral  SpO2: 95%  Weight: 196 lb 11.2 oz (89.2 kg)    Body mass index is 29.05 kg/m.   Constitutional: NAD, calm, comfortable Respiratory: clear to auscultation bilaterally, no wheezing, no crackles. Normal respiratory effort. No accessory muscle use.  Cardiovascular: Regular rate and rhythm, no murmurs / rubs / gallops. No extremity edema. 2+ pedal pulses. No carotid bruits.  Musculoskeletal: no clubbing / cyanosis. No joint deformity upper and lower extremities. Good ROM, no contractures. Normal muscle tone.  Neurologic: grossly intact and non-focal. Psychiatric: Normal judgment and  insight. Alert and oriented x 3. Normal mood.    Impression and Plan:  DM (diabetes mellitus), type 2 with peripheral vascular complications ([place])   -I believe best option here is to start long acting insulin.  -Have given him a sample of basaglar from clinic and asked him to start 8 units at bedtime. For every third morning that his FBS is above 150, he has been instructed to increase basaglar by 3 units. -He has been instructed to notify us of any hypoglycemic events. -He will call his insurance company to find out what their preferred long-acting insulin is. -He is advised to continue metformin, stop glipizide and continue victoza for now since he just got a refill. Once he finishes this pen, I see no point in continuing it as we can just continue to titrate insulin. -May end up needing meal coverage insulin if A1C not significantly improved over the course of the next 6 months. -He has also been counseled on lifestyle modifications including better food choices and increased physical activity to promote weight loss. -He will return to clinic in 3 months for follow up.    Patient Instructions  -It was nice seeing you today!  -Start taking basaglar 8 units at bedtime. For every third morning that your fasting blood sugar is above 150, increase basaglar by 3 units. If you have any blood sugars below 150, leave basaglar at the same dose. If you have any blood sugars below 70, eat a sugary snack and call us immediately.  -Stop taking the glipizide, continue the metformin.  -Please schedule follow up in 3 months for continued diabetic management.     Lelon Frohlich, MD [place] [facility]

## 2018-01-14 NOTE — Patient Instructions (Signed)
-  It was nice seeing you today!  -Start taking basaglar 8 units at bedtime. For every third morning that your fasting blood sugar is above 150, increase basaglar by 3 units. If you have any blood sugars below 150, leave basaglar at the same dose. If you have any blood sugars below 70, eat a sugary snack and call us immediately.  -Stop taking the glipizide, continue the metformin.  -Please schedule follow up in 3 months for continued diabetic management.

## 2018-01-17 ENCOUNTER — Telehealth: Payer: Self-pay | Admitting: Internal Medicine

## 2018-01-17 NOTE — Telephone Encounter (Signed)
Copied from [place] [id number]. Topic: Quick Communication - See Telephone Encounter >> Jan 17, 2018  4:28 PM Blase Mess A wrote: CRM for notification. See Telephone encounter for: 01/17/18.

## 2018-01-17 NOTE — Telephone Encounter (Signed)
CRM for notification. See Telephone encounter for: 01/17/18.  [facility] is calling to see if an appointment can be scheduled for the patient for his AWV in the morning. Please advise.   CRM message was not copied into telephone encounter.

## 2018-01-18 ENCOUNTER — Telehealth: Payer: Self-pay

## 2018-01-18 ENCOUNTER — Encounter: Payer: Self-pay | Admitting: Internal Medicine

## 2018-01-18 ENCOUNTER — Ambulatory Visit (INDEPENDENT_AMBULATORY_CARE_PROVIDER_SITE_OTHER): Payer: Medicare Other | Admitting: Internal Medicine

## 2018-01-18 VITALS — BP 128/84 | HR 61 | Temp 97.5°F | Ht 68.0 in | Wt 194.5 lb

## 2018-01-18 DIAGNOSIS — E118 Type 2 diabetes mellitus with unspecified complications: Secondary | ICD-10-CM

## 2018-01-18 DIAGNOSIS — Z0000 Encounter for general adult medical examination without abnormal findings: Secondary | ICD-10-CM | POA: Diagnosis not present

## 2018-01-18 DIAGNOSIS — Z7189 Other specified counseling: Secondary | ICD-10-CM | POA: Diagnosis not present

## 2018-01-18 NOTE — Telephone Encounter (Signed)
Left message on machine for patient to return our call.   Okay to work in at 3:30 01/18/18 per Dr Cherly Hensen.

## 2018-01-18 NOTE — Patient Instructions (Addendum)
-It was nice seeing you today!  -Will plan to see you back as scheduled for your diabetes follow up.  -Start using the long-acting insulin as discussed at your last visit.    Preventive Care 77 Years and Older, Male Preventive care refers to lifestyle choices and visits with your health care provider that can promote health and wellness. What does preventive care include?   A yearly physical exam. This is also called an annual well check.  Dental exams once or twice a year.  Routine eye exams. Ask your health care provider how often you should have your eyes checked.  Personal lifestyle choices, including: ? Daily care of your teeth and gums. ? Regular physical activity. ? Eating a healthy diet. ? Avoiding tobacco and drug use. ? Limiting alcohol use. ? Practicing safe sex. ? Taking low doses of aspirin every day. ? Taking vitamin and mineral supplements as recommended by your health care provider. What happens during an annual well check? The services and screenings done by your health care provider during your annual well check will depend on your age, overall health, lifestyle risk factors, and family history of disease. Counseling Your health care provider may ask you questions about your:  Alcohol use.  Tobacco use.  Drug use.  Emotional well-being.  Home and relationship well-being.  Sexual activity.  Eating habits.  History of falls.  Memory and ability to understand (cognition).  Work and work Statistician. Screening You may have the following tests or measurements:  Height, weight, and BMI.  Blood pressure.  Lipid and cholesterol levels. These may be checked every 5 years, or more frequently if you are over 70 years old.  Skin check.  Lung cancer screening. You may have this screening every year starting at age 77 if you have a 30-pack-year history of smoking and currently smoke or have quit within the past 15 years.  Colorectal cancer  screening. All adults should have this screening starting at age 27 and continuing until age 77 You will have tests every 1-10 years, depending on your results and the type of screening test. People at increased risk should start screening at an earlier age. Screening tests may include: ? Guaiac-based fecal occult blood testing. ? Fecal immunochemical test (FIT). ? Stool DNA test. ? Virtual colonoscopy. ? Sigmoidoscopy. During this test, a flexible tube with a tiny camera (sigmoidoscope) is used to examine your rectum and lower colon. The sigmoidoscope is inserted through your anus into your rectum and lower colon. ? Colonoscopy. During this test, a long, thin, flexible tube with a tiny camera (colonoscope) is used to examine your entire colon and rectum.  Prostate cancer screening. Recommendations will vary depending on your family history and other risks.  Hepatitis C blood test.  Hepatitis B blood test.  Sexually transmitted disease (STD) testing.  Diabetes screening. This is done by checking your blood sugar (glucose) after you have not eaten for a while (fasting). You may have this done every 1-3 years.  Abdominal aortic aneurysm (AAA) screening. You may need this if you are a current or former smoker.  Osteoporosis. You may be screened starting at age 77 if you are at high risk. Talk with your health care provider about your test results, treatment options, and if necessary, the need for more tests. Vaccines Your health care provider may recommend certain vaccines, such as:  Influenza vaccine. This is recommended every year.  Tetanus, diphtheria, and acellular pertussis (Tdap, Td) vaccine. You may need a  Td booster every 10 years.  Varicella vaccine. You may need this if you have not been vaccinated.  Zoster vaccine. You may need this after age 77.  Measles, mumps, and rubella (MMR) vaccine. You may need at least one dose of MMR if you were born in 1957 or later. You may also  need a second dose.  Pneumococcal 13-valent conjugate (PCV13) vaccine. One dose is recommended after age 77.  Pneumococcal polysaccharide (PPSV23) vaccine. One dose is recommended after age 77.  Meningococcal vaccine. You may need this if you have certain conditions.  Hepatitis A vaccine. You may need this if you have certain conditions or if you travel or work in places where you may be exposed to hepatitis A.  Hepatitis B vaccine. You may need this if you have certain conditions or if you travel or work in places where you may be exposed to hepatitis B.  Haemophilus influenzae type b (Hib) vaccine. You may need this if you have certain risk factors. Talk to your health care provider about which screenings and vaccines you need and how often you need them. This information is not intended to replace advice given to you by your health care provider. Make sure you discuss any questions you have with your health care provider. Document Released: 02/15/2015 Document Revised: 03/11/2017 Document Reviewed: 11/20/2014 Elsevier Interactive Patient Education  2019 [employer].

## 2018-01-18 NOTE — Telephone Encounter (Signed)
Patient has been scheduled

## 2018-01-18 NOTE — Telephone Encounter (Signed)
Copied from [facility] [id number]. Topic: Quick Communication - See Telephone Encounter >> Jan 18, 2018  7:49 AM Lamarr Lulas, CMA wrote: CRM for notification. See Telephone encounter for: 01/18/18. Okay for triage nurse to speak with patient  And schedule patient >> Jan 18, 2018  8:01 AM Cecelia Byars, NT wrote: Patients wife called ,Nadene Rubins unable to schedule for the 330 today due to restrictions , patients wife told of the appointment time and would like a call at [phone] to confirm ,please advise

## 2018-01-18 NOTE — Progress Notes (Addendum)
Established Patient Office Visit     CC/Reason for Visit: Annual medicare wellness visit  HPI: Jason Fisher is a 77 y.o. male who is coming in today for the above mentioned reasons.  He is up-to-date on routine eye care.  He has not had dental care in years.  He has aged out of routine colon cancer screening, he is currently not smoking.  We have discussed diet and physical activity at length today and at prior visit earlier this month.  His immunizations are up-to-date with the exception of shingles, have discussed shingles vaccination, he will consider and decide if he wants to get this done at his local drugstore.  Diabetic foot exam will be performed today.  We have discussed advanced care planning today.  He has no acute complaints.  He has not started the long-acting insulin as discussed during prior visit.  Subsequent Medicare wellness visit   1. Risk factors, based on past  M,S,F -cardiovascular risk factors include insulin-dependent diabetes, hypertension, prior history of coronary artery disease   2.  Physical activities: Not very physically active, will walk maybe once or twice a week for 10 minutes   3.  Depression/mood:  Mood is stable, not depressed.  PHQ 2 with a score of 0   4.  Hearing:  No issues, does not use hearing aids   5.  ADL's: Independent   6.  Fall risk:  Low fall risk   7.  Home safety: No problems identified   8.  Height weight, and visual acuity: As documented in the chart, has routine eye care for vision exam   9.  Counseling:  Have discussed continued lifestyle modifications including better eating choices and physical activity as pertains to improving his cardiovascular risk factors.  We have also discussed compliance with medications   10. Lab orders based on risk factors: Laboratory update will be reviewed   11. Referral :  None today   12. Care plan:  Continue management of diabetes and hypertension by PCP, advised to increase physical  activity and reports compliance with medications   13. Cognitive assessment:  No signs of cognitive decline, doing well   14. Screening: Patient provided with a written and personalized 5-10 year screening schedule in the AVS.   yes   15. Provider List Update:   PCP only   Past Medical/Surgical History: Past Medical History:  Diagnosis Date  . 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 BENIGN PROSTATIC HYPERTROPHY, HX OF 02/24/2007  . CAD S/P PCI    a. 2007 Inf STEMI s/p Vision BMS  2 to RCA;  b. 2009 ISR Prox RCA Stent-->with DES;  c. 09/2015 PCI: LM nl, LAD 30p/m, D1 90, LCX nl, OM1 small, 80, OM2 90 (2.5x14 Biofreedom stent), OM3 small, RCA 30p ISR, 89m ISR, 21m, 85d, EF 50-55%.  . 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 Chronic atrial fibrillation 11/06/2009   a. On Eliquis (CHA2DS2VASc = 5).  Rate controlled w/ BB and CCB.  Marland Kitchen Chronic diastolic CHF (congestive heart failure) ([place])    a. 09/2015 Echo: EF 55-60%, mildly dil LA.  . CKD (chronic kidney disease), stage III ([place])    they mentioned this to the patient but they would work on it later after the aneurysm  . COLONIC POLYPS, HX OF 11/02/2006  . COPD (chronic obstructive pulmonary disease) ([place])   . DIABETES MELLITUS, TYPE II 10/29/2006  . Diverticulosis   . History of tobacco abuse  a. Quit 09/2015.  Marland Kitchen HYPERLIPIDEMIA 10/29/2006  . Hypertensive heart disease with heart failure ([place])   . Pneumonia 09/2015   [name] 07/31/2016  . STEMI (ST elevation myocardial infarction) [facility]) 2007   [name] 07/31/2016    Past Surgical History:  Procedure Laterality Date  . ABDOMINAL AORTIC ENDOVASCULAR STENT GRAFT N/A 07/31/2016   Procedure: ABDOMINAL AORTIC ENDOVASCULAR STENT GRAFT;  Surgeon: Serafina Mitchell, MD;  Location: [facility];  Service: Vascular;  Laterality: N/A;  . CARDIAC CATHETERIZATION N/A 09/26/2015   Procedure: Left Heart Cath and Coronary Angiography;  Surgeon: Peter M Martinique, MD;  Location: [facility];  Service:  Cardiovascular;  Laterality: N/A;  . CARDIAC CATHETERIZATION N/A 09/26/2015   Procedure: Coronary Stent Intervention;  Surgeon: Peter M Martinique, MD;  Location: [facility];  Service: Cardiovascular;  Laterality: N/A;  . CATARACT EXTRACTION Bilateral   . COLONOSCOPY    . CORONARY ANGIOPLASTY WITH STENT PLACEMENT  2007   Inferior STEMI 2007 - RCA PCI with 2 Vision BMS; Abnormal Myoview in 2009 - pRCA ins-stent restenosis & unstented disease - DES PCI Promus 3.0 x 20 mm (3.5 mm)  . ENDOVASCULAR REPAIR/STENT GRAFT  07/31/2016  . INCISION AND DRAINAGE PERIRECTAL ABSCESS N/A 11/06/2015   Procedure: IRRIGATION AND DEBRIDEMENT PERIRECTAL ABSCESS, POSSIBLE HEMORRHOIDECTOMY;  Surgeon: Coralie Keens, MD;  Location: [facility];  Service: General;  Laterality: N/A;  . ORIF SHOULDER FRACTURE Right    "fell out of back end of a truck"    Social History:  reports that he quit smoking about 2 years ago. His smoking use included cigarettes. He has a 49.00 pack-year smoking history. He has never used smokeless tobacco. He reports that he does not drink alcohol or use drugs.  Allergies: Allergies  Allergen Reactions  . Contrast Media [Iodinated Diagnostic Agents] Anaphylaxis    Family History:  Family History  Problem Relation Age of Onset  . Heart attack Mother   . Diabetes Mother   . Heart attack Father   . Hypertension Father   . Heart attack Brother   . Diabetes Brother      Current Outpatient Medications:  .  ACCU-CHEK AVIVA PLUS test strip, CHECK BLOOD SUGAR ONCE DAILY AS NEEDED, Disp: 100 each, Rfl: 2 .  apixaban (ELIQUIS) 5 MG TABS tablet, Take 1 tablet (5 mg total) by mouth 2 (two) times daily., Disp: 60 tablet, Rfl: 4 .  atorvastatin (LIPITOR) 20 MG tablet, Take 1 tablet (20 mg total) by mouth daily., Disp: 90 tablet, Rfl: 4 .  clopidogrel (PLAVIX) 75 MG tablet, Take 1 tablet (75 mg total) by mouth daily., Disp: 90 tablet, Rfl: 3 .  diltiazem (CARDIZEM CD) 360 MG 24 hr capsule, Take 1  capsule (360 mg total) by mouth daily., Disp: 90 capsule, Rfl: 4 .  furosemide (LASIX) 80 MG tablet, Take 1 tablet (80 mg total) by mouth 2 (two) times daily., Disp: 180 tablet, Rfl: 1 .  Glycopyrrolate-Formoterol (BEVESPI AEROSPHERE) 9-4.8 MCG/ACT AERO, Inhale 2 puffs into the lungs 2 (two) times daily., Disp: 1 Inhaler, Rfl: 5 .  hydrALAZINE (APRESOLINE) 25 MG tablet, Take 1 tablet (25 mg total) by mouth 3 (three) times daily., Disp: 270 tablet, Rfl: 3 .  Insulin Glargine (BASAGLAR KWIKPEN) 100 UNIT/ML SOPN, Inject 0.08 mLs (8 Units total) into the skin at bedtime., Disp: 5 pen, Rfl: 3 .  levalbuterol (XOPENEX HFA) 45 MCG/ACT inhaler, Inhale 1-2 puffs into the lungs every 6 (six) hours as needed for wheezing., Disp: 1  Inhaler, Rfl: 5 .  liraglutide (VICTOZA) 18 MG/3ML SOPN, Inject 0.3 mLs (1.8 mg total) into the skin daily., Disp: 10 pen, Rfl: 1 .  metFORMIN (GLUCOPHAGE) 1000 MG tablet, TAKE 1 TABLET TWICE DAILY WITH A MEAL, Disp: 180 tablet, Rfl: 4 .  metolazone (ZAROXOLYN) 2.5 MG tablet, Take 1 tablet 30 minutes before lasix dose once weekly, Disp: 90 tablet, Rfl: 3 .  metoprolol tartrate (LOPRESSOR) 100 MG tablet, Take 1 tablet (100 mg total) by mouth 2 (two) times daily., Disp: 180 tablet, Rfl: 4 .  nitroGLYCERIN (NITROSTAT) 0.4 MG SL tablet, Place 1 tablet (0.4 mg total) under the tongue every 5 (five) minutes as needed for chest pain. X 3 doses, Disp: 25 tablet, Rfl: 4 .  polyethylene glycol (MIRALAX / GLYCOLAX) packet, Take 17 g by mouth daily as needed for mild constipation., Disp: , Rfl:  .  potassium chloride SA (KLOR-CON M20) 20 MEQ tablet, Take 2 tablets in the morning (40mg ) and 2 tablets in the evening daily (40mg ), take an extra 2 tablets at lunch on days with metolazone, Disp: 360 tablet, Rfl: 2  Review of Systems:  Constitutional: Denies fever, chills, diaphoresis, appetite change and fatigue.  HEENT: Denies photophobia, eye pain, redness, hearing loss, ear pain, congestion, sore  throat, rhinorrhea, sneezing, mouth sores, trouble swallowing, neck pain, neck stiffness and tinnitus.   Respiratory: Denies SOB, DOE, cough, chest tightness,  and wheezing.   Cardiovascular: Denies chest pain, palpitations and leg swelling.  Gastrointestinal: Denies nausea, vomiting, abdominal pain, diarrhea, constipation, blood in stool and abdominal distention.  Genitourinary: Denies dysuria, urgency, frequency, hematuria, flank pain and difficulty urinating.  Endocrine: Denies: hot or cold intolerance, sweats, changes in hair or nails, polyuria, polydipsia. Musculoskeletal: Denies myalgias, back pain, joint swelling, arthralgias and gait problem.  Skin: Denies pallor, rash and wound.  Neurological: Denies dizziness, seizures, syncope, weakness, light-headedness, numbness and headaches.  Hematological: Denies adenopathy. Easy bruising, personal or family bleeding history  Psychiatric/Behavioral: Denies suicidal ideation, mood changes, confusion, nervousness, sleep disturbance and agitation    Physical Exam: Vitals:   01/18/18 1527  BP: 128/84  Pulse: 61  Temp: (!) 97.5 F (36.4 C)  TempSrc: Oral  SpO2: 95%  Weight: 194 lb 8 oz (88.2 kg)  Height: 5\' 8"  (1.727 m)    Body mass index is 29.57 kg/m.   Constitutional: NAD, calm, comfortable Eyes: PERRL, lids and conjunctivae normal, wears corrective lenses ENMT: Mucous membranes are moist. Posterior pharynx clear of any exudate or lesions. Normal dentition. Tympanic membrane is pearly white, no erythema or bulging. Neck: normal, supple, no masses, no thyromegaly Respiratory: clear to auscultation bilaterally, no wheezing, no crackles. Normal respiratory effort. No accessory muscle use.  Cardiovascular: Regular rate and rhythm, no murmurs / rubs / gallops. No extremity edema. 2+ pedal pulses. No carotid bruits.  Abdomen: no tenderness, no masses palpated. No hepatosplenomegaly. Bowel sounds positive.  Musculoskeletal: no clubbing /  cyanosis. No joint deformity upper and lower extremities. Good ROM, no contractures. Normal muscle tone.  Skin: no rashes, lesions, ulcers. No induration Neurologic: CN 2-12 grossly intact. Sensation intact, DTR normal. Strength 5/5 in all 4.  Psychiatric: Normal judgment and insight. Alert and oriented x 3. Normal mood.    Impression and Plan:  Medicare annual wellness visit, subsequent -Up-to-date on eye care, has aged out of cancer screening, have encouraged routine dental care. -Up-to-date on most immunizations, have discussed pros and cons of shingles immunization today.  He will continue to consider this.  Diabetes  mellitus with complication ([place]) -He did not start long-acting insulin as discussed at last visit. -Discussed with both him and wife and they will initiate it today. -I will plan to see him back in 3 months. -Diabetic foot exam has been performed today.  Advanced care planning/counseling discussion -Discussed advanced care planning at length with patient and his wife today. -He would like his wife to be his healthcare power of attorney in case he was incapacitated to make decisions for himself. -He would not like to be on a ventilator long-term, however he agrees to short-term ventilator use and C none PR if the condition is deemed reversible. -We have spent 10 minutes today discussing advanced care planning.   Patient Instructions  -It was nice seeing you today!  -Will plan to see you back as scheduled for your diabetes follow up.  -Start using the long-acting insulin as discussed at your last visit.     Lelon Frohlich, MD [facility] [facility]

## 2018-02-08 ENCOUNTER — Ambulatory Visit (INDEPENDENT_AMBULATORY_CARE_PROVIDER_SITE_OTHER): Payer: Medicare Other | Admitting: Internal Medicine

## 2018-02-08 ENCOUNTER — Encounter: Payer: Self-pay | Admitting: Internal Medicine

## 2018-02-08 VITALS — BP 120/84 | HR 70 | Temp 98.0°F | Ht 68.0 in | Wt 193.7 lb

## 2018-02-08 DIAGNOSIS — I4821 Permanent atrial fibrillation: Secondary | ICD-10-CM | POA: Diagnosis not present

## 2018-02-08 DIAGNOSIS — I5032 Chronic diastolic (congestive) heart failure: Secondary | ICD-10-CM | POA: Diagnosis not present

## 2018-02-08 DIAGNOSIS — E1151 Type 2 diabetes mellitus with diabetic peripheral angiopathy without gangrene: Secondary | ICD-10-CM | POA: Diagnosis not present

## 2018-02-08 DIAGNOSIS — J449 Chronic obstructive pulmonary disease, unspecified: Secondary | ICD-10-CM | POA: Diagnosis not present

## 2018-02-08 DIAGNOSIS — I4819 Other persistent atrial fibrillation: Secondary | ICD-10-CM | POA: Diagnosis not present

## 2018-02-08 NOTE — Patient Instructions (Signed)
-  It was nice see you today!  -We will see you as previously scheduled in 2 to 3 months for continued diabetic management.  -Continue to work on healthier food choices and increased physical activity.

## 2018-02-08 NOTE — Progress Notes (Signed)
Established Patient Office Visit     CC/Reason for Visit: Follow-up on chronic medical conditions  HPI: Jason Fisher is a 78 y.o. male who is coming in today for the above mentioned reasons.  Due for annual physical exam in December 2020.  Past Medical History is significant for: Chronic diastolic heart failure, benign essential hypertension, type 2 diabetes, chronic atrial fibrillation, chronic kidney disease stage III.  He has no acute complaints at today's visit.  He is seeing me today for diabetic management mainly as he was recently started on long-acting insulin.  He is up to 16 units at night and continues to titrate as we had previously discussed.  His CBGs remain elevated in the 200-400 range, there has been some improvement over the past week.  He admits to not making healthy diet choices over the holidays.   Past Medical/Surgical History: Past Medical History:  Diagnosis Date  . 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 BENIGN PROSTATIC HYPERTROPHY, HX OF 02/24/2007  . CAD S/P PCI    a. 2007 Inf STEMI s/p Vision BMS  2 to RCA;  b. 2009 ISR Prox RCA Stent-->with DES;  c. 09/2015 PCI: LM nl, LAD 30p/m, D1 90, LCX nl, OM1 small, 80, OM2 90 (2.5x14 Biofreedom stent), OM3 small, RCA 30p ISR, 41m ISR, 9m, 85d, EF 50-55%.  . 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 Chronic atrial fibrillation 11/06/2009   a. On Eliquis (CHA2DS2VASc = 5).  Rate controlled w/ BB and CCB.  Marland Kitchen Chronic diastolic CHF (congestive heart failure) ([place])    a. 09/2015 Echo: EF 55-60%, mildly dil LA.  . CKD (chronic kidney disease), stage III ([place])    they mentioned this to the patient but they would work on it later after the aneurysm  . COLONIC POLYPS, HX OF 11/02/2006  . COPD (chronic obstructive pulmonary disease) ([place])   . DIABETES MELLITUS, TYPE II 10/29/2006  . Diverticulosis   . History of tobacco abuse    a. Quit 09/2015.  Marland Kitchen HYPERLIPIDEMIA 10/29/2006  . Hypertensive heart disease with heart failure  ([place])   . Pneumonia 09/2015   [name] 07/31/2016  . STEMI (ST elevation myocardial infarction) [facility]) 2007   [name] 07/31/2016    Past Surgical History:  Procedure Laterality Date  . ABDOMINAL AORTIC ENDOVASCULAR STENT GRAFT N/A 07/31/2016   Procedure: ABDOMINAL AORTIC ENDOVASCULAR STENT GRAFT;  Surgeon: Serafina Mitchell, MD;  Location: [place];  Service: Vascular;  Laterality: N/A;  . CARDIAC CATHETERIZATION N/A 09/26/2015   Procedure: Left Heart Cath and Coronary Angiography;  Surgeon: Peter M Martinique, MD;  Location: [place] [facility];  Service: Cardiovascular;  Laterality: N/A;  . CARDIAC CATHETERIZATION N/A 09/26/2015   Procedure: Coronary Stent Intervention;  Surgeon: Peter M Martinique, MD;  Location: [place] [facility];  Service: Cardiovascular;  Laterality: N/A;  . CATARACT EXTRACTION Bilateral   . COLONOSCOPY    . CORONARY ANGIOPLASTY WITH STENT PLACEMENT  2007   Inferior STEMI 2007 - RCA PCI with 2 Vision BMS; Abnormal Myoview in 2009 - pRCA ins-stent restenosis & unstented disease - DES PCI Promus 3.0 x 20 mm (3.5 mm)  . ENDOVASCULAR REPAIR/STENT GRAFT  07/31/2016  . INCISION AND DRAINAGE PERIRECTAL ABSCESS N/A 11/06/2015   Procedure: IRRIGATION AND DEBRIDEMENT PERIRECTAL ABSCESS, POSSIBLE HEMORRHOIDECTOMY;  Surgeon: Coralie Keens, MD;  Location: [place];  Service: General;  Laterality: N/A;  . ORIF SHOULDER FRACTURE Right    "fell out of back end of a truck"  Social History:  reports that he quit smoking about 2 years ago. His smoking use included cigarettes. He has a 49.00 pack-year smoking history. He has never used smokeless tobacco. He reports that he does not drink alcohol or use drugs.  Allergies: Allergies  Allergen Reactions  . Contrast Media [Iodinated Diagnostic Agents] Anaphylaxis    Family History:  Family History  Problem Relation Age of Onset  . Heart attack Mother   . Diabetes Mother   . Heart attack Father   . Hypertension Father   . Heart attack Brother     . Diabetes Brother      Current Outpatient Medications:  .  ACCU-CHEK AVIVA PLUS test strip, CHECK BLOOD SUGAR ONCE DAILY AS NEEDED, Disp: 100 each, Rfl: 2 .  apixaban (ELIQUIS) 5 MG TABS tablet, Take 1 tablet (5 mg total) by mouth 2 (two) times daily., Disp: 60 tablet, Rfl: 4 .  atorvastatin (LIPITOR) 20 MG tablet, Take 1 tablet (20 mg total) by mouth daily., Disp: 90 tablet, Rfl: 4 .  clopidogrel (PLAVIX) 75 MG tablet, Take 1 tablet (75 mg total) by mouth daily., Disp: 90 tablet, Rfl: 3 .  diltiazem (CARDIZEM CD) 360 MG 24 hr capsule, Take 1 capsule (360 mg total) by mouth daily., Disp: 90 capsule, Rfl: 4 .  furosemide (LASIX) 80 MG tablet, Take 1 tablet (80 mg total) by mouth 2 (two) times daily., Disp: 180 tablet, Rfl: 1 .  Glycopyrrolate-Formoterol (BEVESPI AEROSPHERE) 9-4.8 MCG/ACT AERO, Inhale 2 puffs into the lungs 2 (two) times daily., Disp: 1 Inhaler, Rfl: 5 .  hydrALAZINE (APRESOLINE) 25 MG tablet, Take 1 tablet (25 mg total) by mouth 3 (three) times daily., Disp: 270 tablet, Rfl: 3 .  insulin degludec (TRESIBA FLEXTOUCH) 100 UNIT/ML SOPN FlexTouch Pen, Inject 16 Units into the skin daily., Disp: , Rfl:  .  levalbuterol (XOPENEX HFA) 45 MCG/ACT inhaler, Inhale 1-2 puffs into the lungs every 6 (six) hours as needed for wheezing., Disp: 1 Inhaler, Rfl: 5 .  liraglutide (VICTOZA) 18 MG/3ML SOPN, Inject 0.3 mLs (1.8 mg total) into the skin daily., Disp: 10 pen, Rfl: 1 .  metFORMIN (GLUCOPHAGE) 1000 MG tablet, TAKE 1 TABLET TWICE DAILY WITH A MEAL, Disp: 180 tablet, Rfl: 4 .  metolazone (ZAROXOLYN) 2.5 MG tablet, Take 1 tablet 30 minutes before lasix dose once weekly, Disp: 90 tablet, Rfl: 3 .  metoprolol tartrate (LOPRESSOR) 100 MG tablet, Take 1 tablet (100 mg total) by mouth 2 (two) times daily., Disp: 180 tablet, Rfl: 4 .  nitroGLYCERIN (NITROSTAT) 0.4 MG SL tablet, Place 1 tablet (0.4 mg total) under the tongue every 5 (five) minutes as needed for chest pain. X 3 doses, Disp: 25  tablet, Rfl: 4 .  polyethylene glycol (MIRALAX / GLYCOLAX) packet, Take 17 g by mouth daily as needed for mild constipation., Disp: , Rfl:  .  potassium chloride SA (KLOR-CON M20) 20 MEQ tablet, Take 2 tablets in the morning (40mg ) and 2 tablets in the evening daily (40mg ), take an extra 2 tablets at lunch on days with metolazone, Disp: 360 tablet, Rfl: 2  Review of Systems:  Constitutional: Denies fever, chills, diaphoresis, appetite change and fatigue.  HEENT: Denies photophobia, eye pain, redness, hearing loss, ear pain, congestion, sore throat, rhinorrhea, sneezing, mouth sores, trouble swallowing, neck pain, neck stiffness and tinnitus.   Respiratory: Denies SOB, DOE, cough, chest tightness,  and wheezing.   Cardiovascular: Denies chest pain, palpitations and leg swelling.  Gastrointestinal: Denies nausea, vomiting, abdominal pain, diarrhea,  constipation, blood in stool and abdominal distention.  Genitourinary: Denies dysuria, urgency, frequency, hematuria, flank pain and difficulty urinating.  Endocrine: Denies: hot or cold intolerance, sweats, changes in hair or nails, polyuria, polydipsia. Musculoskeletal: Denies myalgias, back pain, joint swelling, arthralgias and gait problem.  Skin: Denies pallor, rash and wound.  Neurological: Denies dizziness, seizures, syncope, weakness, light-headedness, numbness and headaches.  Hematological: Denies adenopathy. Easy bruising, personal or family bleeding history  Psychiatric/Behavioral: Denies suicidal ideation, mood changes, confusion, nervousness, sleep disturbance and agitation    Physical Exam: Vitals:   02/08/18 0859  BP: 120/84  Pulse: 70  Temp: 98 F (36.7 C)  TempSrc: Oral  SpO2: 94%  Weight: 193 lb 11.2 oz (87.9 kg)  Height: 5\' 8"  (1.727 m)    Body mass index is 29.45 kg/m.   Constitutional: NAD, calm, comfortable Eyes: PERRL, lids and conjunctivae normal ENMT: Mucous membranes are moist. Posterior pharynx clear of any  exudate or lesions. Normal dentition.  Neck: normal, supple, no masses, no thyromegaly Respiratory: clear to auscultation bilaterally, no wheezing, no crackles. Normal respiratory effort. No accessory muscle use.  Cardiovascular: Irregularly irregular rhythm no murmurs / rubs / gallops. No extremity edema. 2+ pedal pulses. No carotid bruits.  Abdomen: no tenderness, no masses palpated. No hepatosplenomegaly. Bowel sounds positive.  Musculoskeletal: no clubbing / cyanosis. No joint deformity upper and lower extremities. Good ROM, no contractures. Normal muscle tone.  Skin: no rashes, lesions, ulcers. No induration Neurologic: CN 2-12 grossly intact. Sensation intact, DTR normal. Strength 5/5 in all 4.  Psychiatric: Normal judgment and insight. Alert and oriented x 3. Normal mood.    Impression and Plan:   DM (diabetes mellitus), type 2 with peripheral vascular complications ([place]) -Most recently started on long-acting insulin due to increasing A1c. -He was started on Basaglar, he would prefer to switch to [place] due to insurance coverage. -He is up to 16 units/day, continues to up titrate on a schedule that we have previously discussed. -Currently remains uncontrolled. -We have continue to enforce diet and activity modifications.  Have suggested diabetes education, he would like to think about that.  Dyslipidemia -On Lipitor 20 mg daily. -LDL was 94 in November 2019. -In a patient who is diabetic with vascular disease, would like to push LDL down further. -Will consider increasing statin if not addressed with cardiology.  Essential hypertension -Well-controlled.  Chronic atrial fibrillation ([place]): CHA2DS2Vasc = 5. On Warfarin. Rate control -Anticoagulated on Eliquis. -Currently rate-controlled.  CKD (chronic kidney disease), stage III ([place]) -Most recent creatinine was 1.35 earlier this month.  Chronic diastolic congestive heart failure (HCC) -Echo from October/2019: Ejection  fraction of 60 to 65%, not technically sufficient to allow evaluation of LV diastolic dysfunction due to atrial fibrillation.  No wall motion abnormalities.  Moderate concentric hypertrophy. -On metoprolol 100 mg, not on ACE inhibitor/ARB due to renal dysfunction. -Continue aspirin, statin. -Follow up with cardiology as scheduled.    Patient Instructions  -It was nice see you today!  -We will see you as previously scheduled in 2 to 3 months for continued diabetic management.  -Continue to work on healthier food choices and increased physical activity.     Lelon Frohlich, MD  Primary Care at [facility]

## 2018-02-15 ENCOUNTER — Encounter: Payer: Self-pay | Admitting: Internal Medicine

## 2018-03-03 NOTE — Progress Notes (Signed)
Cardiology Office Note    Date:  03/07/2018   ID:  Jason Fisher, DOB 12/03/1940, MRN [id number]  PCP:  [name], [name], MD  Cardiologist:  Dr. Martinique   Chief Complaint  Patient presents with  . Congestive Heart Failure  . Atrial Fibrillation  . Coronary Artery Disease    History of Present Illness:  Jason Fisher is a 78 y.o. male seen for follow up CAD and Afib.  He has a PMH of CAD (s/p BMS to RCA 2007, ISR with DES to RCA in 2009, DES to OM2 in 09/2015),chronic atrial fibrillation on Eliquis, chronic diastolic heart failure,HTN, HLD, AAA, COPD,and tobacco use. He was previously admitted in March 2018 for atrial fibrillation with RVR. Troponin was borderline elevated. He was initially treated with IV Cardizem and later switched to p.o. Cardizem prior to discharge. He also underwent AAA endovascular stent grafting by Dr. Trula Slade in June 2018. He was seen in October 2019 by [name] PA-C for increased edema.  Lasix was increased to 80 mg twice daily for 3 days before decreasing back down to 40 mg twice daily thereafter.  Echocardiogram on 11/05/2017 this showed EF 60 to 65%, mildly increased aortic root measuring 40 mm, moderately dilated left and the right atrium.  Repeat blood work obtained on 11/10/2017 showed stable renal function and electrolyte. He was later started on a weekly dose of metolazone.   On follow up today he is doing well. He is not taking metolazone anymore. Only taking lasix 80 mg once a day. No longer taking potassium supplement. Swelling is down. Weight is down 3 lbs. No chest pain. Still has some dyspnea. Tries to walk daily.     Past Medical History:  Diagnosis Date  . 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 BENIGN PROSTATIC HYPERTROPHY, HX OF 02/24/2007  . CAD S/P PCI    a. 2007 Inf STEMI s/p Vision BMS  2 to RCA;  b. 2009 ISR Prox RCA Stent-->with DES;  c. 09/2015 PCI: LM nl, LAD 30p/m, D1 90, LCX nl, OM1 small, 80, OM2 90 (2.5x14 Biofreedom  stent), OM3 small, RCA 30p ISR, 27m ISR, 48m, 85d, EF 50-55%.  . 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 Chronic atrial fibrillation 11/06/2009   a. On Eliquis (CHA2DS2VASc = 5).  Rate controlled w/ BB and CCB.  Marland Kitchen Chronic diastolic CHF (congestive heart failure) ([place])    a. 09/2015 Echo: EF 55-60%, mildly dil LA.  . CKD (chronic kidney disease), stage III ([place])    they mentioned this to the patient but they would work on it later after the aneurysm  . COLONIC POLYPS, HX OF 11/02/2006  . COPD (chronic obstructive pulmonary disease) ([employer])   . DIABETES MELLITUS, TYPE II 10/29/2006  . Diverticulosis   . History of tobacco abuse    a. Quit 09/2015.  Marland Kitchen HYPERLIPIDEMIA 10/29/2006  . Hypertensive heart disease with heart failure ([place])   . Pneumonia 09/2015   [name] 07/31/2016  . STEMI (ST elevation myocardial infarction) [facility]) 2007   [name] 07/31/2016    Past Surgical History:  Procedure Laterality Date  . ABDOMINAL AORTIC ENDOVASCULAR STENT GRAFT N/A 07/31/2016   Procedure: ABDOMINAL AORTIC ENDOVASCULAR STENT GRAFT;  Surgeon: Serafina Mitchell, MD;  Location: [place];  Service: Vascular;  Laterality: N/A;  . CARDIAC CATHETERIZATION N/A 09/26/2015   Procedure: Left Heart Cath and Coronary Angiography;  Surgeon: Peter M Martinique, MD;  Location: [place] [facility];  Service: Cardiovascular;  Laterality:  N/A;  . CARDIAC CATHETERIZATION N/A 09/26/2015   Procedure: Coronary Stent Intervention;  Surgeon: Peter M Martinique, MD;  Location: [place] [facility];  Service: Cardiovascular;  Laterality: N/A;  . CATARACT EXTRACTION Bilateral   . COLONOSCOPY    . CORONARY ANGIOPLASTY WITH STENT PLACEMENT  2007   Inferior STEMI 2007 - RCA PCI with 2 Vision BMS; Abnormal Myoview in 2009 - pRCA ins-stent restenosis & unstented disease - DES PCI Promus 3.0 x 20 mm (3.5 mm)  . ENDOVASCULAR REPAIR/STENT GRAFT  07/31/2016  . INCISION AND DRAINAGE PERIRECTAL ABSCESS N/A 11/06/2015   Procedure: IRRIGATION AND DEBRIDEMENT PERIRECTAL  ABSCESS, POSSIBLE HEMORRHOIDECTOMY;  Surgeon: Coralie Keens, MD;  Location: [place];  Service: General;  Laterality: N/A;  . ORIF SHOULDER FRACTURE Right    "fell out of back end of a truck"    Current Medications: Outpatient Medications Prior to Visit  Medication Sig Dispense Refill  . ACCU-CHEK AVIVA PLUS test strip CHECK BLOOD SUGAR ONCE DAILY AS NEEDED 100 each 2  . apixaban (ELIQUIS) 5 MG TABS tablet Take 1 tablet (5 mg total) by mouth 2 (two) times daily. 60 tablet 4  . clopidogrel (PLAVIX) 75 MG tablet Take 1 tablet (75 mg total) by mouth daily. 90 tablet 3  . diltiazem (CARDIZEM CD) 360 MG 24 hr capsule Take 1 capsule (360 mg total) by mouth daily. 90 capsule 4  . Glycopyrrolate-Formoterol (BEVESPI AEROSPHERE) 9-4.8 MCG/ACT AERO Inhale 2 puffs into the lungs 2 (two) times daily. 1 Inhaler 5  . hydrALAZINE (APRESOLINE) 25 MG tablet Take 1 tablet (25 mg total) by mouth 3 (three) times daily. 270 tablet 3  . insulin degludec (TRESIBA FLEXTOUCH) 100 UNIT/ML SOPN FlexTouch Pen Inject 16 Units into the skin daily.    Marland Kitchen levalbuterol (XOPENEX HFA) 45 MCG/ACT inhaler Inhale 1-2 puffs into the lungs every 6 (six) hours as needed for wheezing. 1 Inhaler 5  . metFORMIN (GLUCOPHAGE) 1000 MG tablet TAKE 1 TABLET TWICE DAILY WITH A MEAL 180 tablet 4  . metoprolol tartrate (LOPRESSOR) 100 MG tablet Take 1 tablet (100 mg total) by mouth 2 (two) times daily. 180 tablet 4  . nitroGLYCERIN (NITROSTAT) 0.4 MG SL tablet Place 1 tablet (0.4 mg total) under the tongue every 5 (five) minutes as needed for chest pain. X 3 doses 25 tablet 4  . polyethylene glycol (MIRALAX / GLYCOLAX) packet Take 17 g by mouth daily as needed for mild constipation.    Marland Kitchen atorvastatin (LIPITOR) 20 MG tablet Take 1 tablet (20 mg total) by mouth daily. 90 tablet 4  . furosemide (LASIX) 80 MG tablet Take 1 tablet (80 mg total) by mouth 2 (two) times daily. 180 tablet 1  . metolazone (ZAROXOLYN) 2.5 MG tablet Take 1 tablet 30 minutes  before lasix dose once weekly 90 tablet 3  . potassium chloride SA (KLOR-CON M20) 20 MEQ tablet Take 2 tablets in the morning (40mg ) and 2 tablets in the evening daily (40mg ), take an extra 2 tablets at lunch on days with metolazone 360 tablet 2  . liraglutide (VICTOZA) 18 MG/3ML SOPN Inject 0.3 mLs (1.8 mg total) into the skin daily. (Patient not taking: Reported on 03/07/2018) 10 pen 1   No facility-administered medications prior to visit.      Allergies:   Contrast media [iodinated diagnostic agents]   Social History   Socioeconomic History  . Marital status: Married    Spouse name: Not on file  . Number of children: 5  . Years of  education: Not on file  . Highest education level: Not on file  Occupational History  . Occupation: [employer]  Social Needs  . Financial resource strain: Not on file  . Food insecurity:    Worry: Not on file    Inability: Not on file  . Transportation needs:    Medical: Not on file    Non-medical: Not on file  Tobacco Use  . Smoking status: Former Smoker    Packs/day: 1.00    Years: 49.00    Pack years: 49.00    Types: Cigarettes    Last attempt to quit: 09/08/2015    Years since quitting: 2.4  . Smokeless tobacco: Never Used  . Tobacco comment: passive smoker as well  Substance and Sexual Activity  . Alcohol use: No    Alcohol/week: 0.0 standard drinks  . Drug use: No  . Sexual activity: Not on file  Lifestyle  . Physical activity:    Days per week: Not on file    Minutes per session: Not on file  . Stress: Not on file  Relationships  . Social connections:    Talks on phone: Not on file    Gets together: Not on file    Attends religious service: Not on file    Active member of club or organization: Not on file    Attends meetings of clubs or organizations: Not on file    Relationship status: Not on file  Other Topics Concern  . Not on file  Social History Narrative  . Not on file     Family History:  The patient's family  history includes Diabetes in his brother and mother; Heart attack in his brother, father, and mother; Hypertension in his father.   ROS:   Please see the history of present illness.    ROS All other systems reviewed and are negative.   PHYSICAL EXAM:   VS:  BP 136/82   Pulse 61   Ht 5\' 9"  (1.753 m)   Wt 192 lb 6.4 oz (87.3 kg)   SpO2 97%   BMI 28.41 kg/m    GENERAL:  Chronically ill appearing WM in NAD HEENT:  PERRL, EOMI, sclera are clear. Oropharynx is clear. NECK:  No jugular venous distention, carotid upstroke brisk and symmetric, no bruits, no thyromegaly or adenopathy LUNGS:  Clear to auscultation bilaterally CHEST:  Unremarkable HEART:  IRRR,  PMI not displaced or sustained,S1 and S2 within normal limits, no S3, no S4: no clicks, no rubs, no murmurs ABD:  Soft, nontender. BS +, no masses or bruits. No hepatomegaly, no splenomegaly EXT:  2 + pulses throughout, no edema, no cyanosis no clubbing SKIN:  Warm and dry.  No rashes NEURO:  Alert and oriented x 3. Cranial nerves II through XII intact. PSYCH:  Cognitively intact    Wt Readings from Last 3 Encounters:  03/07/18 192 lb 6.4 oz (87.3 kg)  02/08/18 193 lb 11.2 oz (87.9 kg)  01/18/18 194 lb 8 oz (88.2 kg)      Studies/Labs Reviewed:   EKG:  EKG is not ordered today.    Recent Labs: 12/03/2017: ALT 25 12/21/2017: BUN 17; Creatinine, Ser 1.35; Potassium 3.6; Sodium 139   Lipid Panel    Component Value Date/Time   CHOL 161 12/03/2017 1210   TRIG 120 12/03/2017 1210   HDL 43 12/03/2017 1210   CHOLHDL 3.7 12/03/2017 1210   CHOLHDL 2.9 09/25/2015 0221   VLDL 13 09/25/2015 0221   LDLCALC 94 12/03/2017  1210    Additional studies/ records that were reviewed today include:   Cath 09/26/2015  Prox LAD to Mid LAD lesion, 30 %stenosed.  1st Diag lesion, 90 %stenosed.  Ost 1st Mrg to 1st Mrg lesion, 80 %stenosed.  Ost RCA to Prox RCA lesion, 30 %stenosed.  Mid RCA-2 lesion, 40 %stenosed.  Mid RCA-1  lesion, 15 %stenosed.  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  There is mild left ventricular systolic dysfunction.  The left ventricular ejection fraction is 50-55% by visual estimate.  LV end diastolic pressure is moderately elevated.  A drug eluting stent was successfully placed.  2nd Mrg lesion, 90 %stenosed.  Post intervention, there is a 0% residual stenosis.  1. 3 vessel obstructive CAD.  - chronic severe stenosis in the first diagonal - New 90% stenosis in a large second OM - Stents in the proximal and mid RCA are patent - Aneurysm and stenosis in the PLOM branch of the RCA- the aneurysm is new compared to 2009 2. Mild LV dysfunction 3. Elevated LVEDP 4. Allergic response to contrast with hives and bronchospasm. 5. Successful stenting of the second OM with a Biofreedom stent.   Plan: continue IV diuresis. Patient received steroids, Benadryl, and IV pepcid for contrast reaction. Continue DAPT for one month then DC ASA. Resume Eliquis in am.     Echo 11/05/2017 LV EF: 60% - 65% Study Conclusions  - Left ventricle: The cavity size was normal. There was moderate concentric hypertrophy. Systolic function was normal. The estimated ejection fraction was in the range of 60% to 65%. Wall motion was normal; there were no regional wall motion abnormalities. The study was not technically sufficient to allow evaluation of LV diastolic dysfunction due to atrial fibrillation. - Aortic valve: Poorly visualized. Trileaflet; moderately thickened, moderately calcified leaflets. - Aorta: Aortic root dimension: 40 mm (ED). Ascending aortic diameter: 38 mm (S). - Aortic root: The aortic root was mildly dilated. - Ascending aorta: The ascending aorta was mildly dilated. - Left atrium: The atrium was moderately dilated. - Right atrium: The atrium was moderately dilated. - Pulmonic valve: There was mild regurgitation. -  Pulmonary arteries: Systolic pressure could not be accurately estimated.     ASSESSMENT:    1. Chronic diastolic congestive heart failure ([place])   2. Chronic atrial fibrillation   3. Coronary artery disease involving native coronary artery of native heart without angina pectoris   4. CKD (chronic kidney disease), stage III ([place])   5. AAA (abdominal aortic aneurysm) without rupture (H[facility])      PLAN:  In order of problems listed above:  1. Chronic diastolic heart failure: Appears to be  euvolemic on physical exam.  Edema is improved and weight is down. Will continue current diuretic therapy and sodium restriction.   2. CAD: Denies any chest pain.  Not on aspirin given the need for Eliquis  3. Chronic atrial fibrillation: On Eliquis, rate controlled with metoprolol  4. Hyperlipidemia: On Lipitor 20 mg daily.  LDL 90. Goal < 70. Will increase to 40 mg daily.   5. History of AAA repair: Followed by Dr. Trula Slade    Medication Adjustments/Labs and Tests Ordered: Current medicines are reviewed at length with the patient today.  Concerns regarding medicines are outlined above.  Medication changes, Labs and Tests ordered today are listed in the Patient Instructions below. Patient Instructions  Increase lipitor to 40 mg daily  Continue your other therapy  follow up in 6  months.    Signed, Peter Martinique, MD  03/07/2018 11:06 AM    [facility] [place], [place], [place]  [address] Phone: [phone]; Fax: [phone]

## 2018-03-07 ENCOUNTER — Ambulatory Visit: Payer: Medicare Other | Admitting: Cardiology

## 2018-03-07 ENCOUNTER — Encounter: Payer: Self-pay | Admitting: Cardiology

## 2018-03-07 VITALS — BP 136/82 | HR 61 | Ht 69.0 in | Wt 192.4 lb

## 2018-03-07 DIAGNOSIS — I714 Abdominal aortic aneurysm, without rupture, unspecified: Secondary | ICD-10-CM

## 2018-03-07 DIAGNOSIS — N183 Chronic kidney disease, stage 3 unspecified: Secondary | ICD-10-CM

## 2018-03-07 DIAGNOSIS — I5032 Chronic diastolic (congestive) heart failure: Secondary | ICD-10-CM | POA: Diagnosis not present

## 2018-03-07 DIAGNOSIS — I482 Chronic atrial fibrillation, unspecified: Secondary | ICD-10-CM

## 2018-03-07 DIAGNOSIS — I2510 Atherosclerotic heart disease of native coronary artery without angina pectoris: Secondary | ICD-10-CM

## 2018-03-07 MED ORDER — ATORVASTATIN CALCIUM 40 MG PO TABS: 40.0000 mg | ORAL_TABLET | Freq: Every day | ORAL | 3 refills | Status: DC

## 2018-03-07 MED ORDER — FUROSEMIDE 80 MG PO TABS: 80.0000 mg | ORAL_TABLET | Freq: Every day | ORAL | 1 refills | Status: DC

## 2018-03-07 NOTE — Patient Instructions (Signed)
Increase lipitor to 40 mg daily  Continue your other therapy  follow up in 6 months.

## 2018-03-09 ENCOUNTER — Other Ambulatory Visit: Payer: Self-pay | Admitting: Internal Medicine

## 2018-03-09 ENCOUNTER — Telehealth: Payer: Self-pay | Admitting: Internal Medicine

## 2018-03-09 MED ORDER — INSULIN DEGLUDEC 100 UNIT/ML ~~LOC~~ SOPN: 16.0000 [IU] | PEN_INJECTOR | Freq: Every day | SUBCUTANEOUS | 2 refills | Status: DC

## 2018-03-09 NOTE — Telephone Encounter (Signed)
Requested medication (s) are due for refill today -yes  Requested medication (s) are on the active medication list -yes  Future visit scheduled -yes  Last refill: 4 weeks ago  Notes to clinic: Patient is requesting Rx from historical provider. Sent for Rx review and refill by PCP  Requested Prescriptions  Pending Prescriptions Disp Refills   insulin degludec (TRESIBA FLEXTOUCH) 100 UNIT/ML SOPN FlexTouch Pen      Sig: Inject 0.16 mLs (16 Units total) into the skin daily.     Endocrinology:  Diabetes - Insulins Failed - 03/09/2018 11:46 AM      Failed - HBA1C is between 0 and 7.9 and within 180 days    Hgb A1c MFr Bld  Date Value Ref Range Status  12/21/2017 9.5 (H) 4.6 - 6.5 % Final    Comment:    Glycemic Control Guidelines for People with Diabetes:Non Diabetic:  <6%Goal of Therapy: <7%Additional Action Suggested:  >8%          Passed - Valid encounter within last 6 months    Recent Outpatient Visits          4 weeks ago DM (diabetes mellitus), type 2 with peripheral vascular complications ([place])   [place] at [place], MD   1 month ago Medicare annual wellness visit, subsequent   Therapist, music at [employer], [name], MD   1 month ago DM (diabetes mellitus), type 2 with peripheral vascular complications ([place])   [place] at [facility], [name], MD   2 months ago DM (diabetes mellitus), type 2 with peripheral vascular complications ([place])   [place] at [facility], [name], MD   4 months ago CAD S/P percutaneous coronary angioplasty   [place] at [facility], Doretha Sou, MD      Future Appointments            In 1 month [name], [place] at [place], [facility]            Requested Prescriptions  Pending Prescriptions Disp Refills   insulin degludec (TRESIBA FLEXTOUCH) 100 UNIT/ML SOPN FlexTouch Pen      Sig:  Inject 0.16 mLs (16 Units total) into the skin daily.     Endocrinology:  Diabetes - Insulins Failed - 03/09/2018 11:46 AM      Failed - HBA1C is between 0 and 7.9 and within 180 days    Hgb A1c MFr Bld  Date Value Ref Range Status  12/21/2017 9.5 (H) 4.6 - 6.5 % Final    Comment:    Glycemic Control Guidelines for People with Diabetes:Non Diabetic:  <6%Goal of Therapy: <7%Additional Action Suggested:  >8%          Passed - Valid encounter within last 6 months    Recent Outpatient Visits          4 weeks ago DM (diabetes mellitus), type 2 with peripheral vascular complications ([place])   [place] at [place], MD   1 month ago Medicare annual wellness visit, subsequent   Therapist, music at [facility], [name], MD   1 month ago DM (diabetes mellitus), type 2 with peripheral vascular complications [facility])   [place] at [facility], [name], MD   2 months ago DM (diabetes mellitus), type 2 with peripheral vascular complications [facility])   [place] at [facility], [name], MD   4 months ago CAD  S/P percutaneous coronary angioplasty   [place] at [facility] [facility], Doretha Sou, MD      Future Appointments            In 1 month [name], [name], MD [employer] at [place], [facility]

## 2018-03-09 NOTE — Telephone Encounter (Signed)
Patient needs a refill on Trulicity.  Has 2 days left on his supply.  Pharmacy:  [facility] [place]

## 2018-03-09 NOTE — Telephone Encounter (Signed)
Office note states patient is taking [place].  Need clarification. Left message on machine for patient to return our call. CRM

## 2018-03-09 NOTE — Telephone Encounter (Signed)
Refill sent.

## 2018-03-09 NOTE — Telephone Encounter (Signed)
Copied from [place] [id number]. Topic: Quick Communication - Rx Refill/Question >> Mar 09, 2018 11:41 AM Keene Breath wrote: Medication: insulin degludec (TRESIBA FLEXTOUCH) 100 UNIT/ML SOPN FlexTouch Pen  Patient called to request a refill for the above medication  Preferred Pharmacy (with phone number or street name): [id number] [place] [place] [facility] [address] [phone] (Phone) [phone] (Fax)

## 2018-04-15 ENCOUNTER — Ambulatory Visit: Payer: Medicare Other | Admitting: Internal Medicine

## 2018-04-18 ENCOUNTER — Other Ambulatory Visit: Payer: Self-pay | Admitting: Internal Medicine

## 2018-04-21 ENCOUNTER — Other Ambulatory Visit: Payer: Self-pay

## 2018-04-21 ENCOUNTER — Ambulatory Visit (INDEPENDENT_AMBULATORY_CARE_PROVIDER_SITE_OTHER): Payer: Medicare Other | Admitting: Internal Medicine

## 2018-04-21 ENCOUNTER — Encounter: Payer: Self-pay | Admitting: Internal Medicine

## 2018-04-21 VITALS — BP 130/90 | HR 77 | Temp 97.6°F | Wt 189.3 lb

## 2018-04-21 DIAGNOSIS — E1151 Type 2 diabetes mellitus with diabetic peripheral angiopathy without gangrene: Secondary | ICD-10-CM | POA: Diagnosis not present

## 2018-04-21 LAB — POCT GLYCOSYLATED HEMOGLOBIN (HGB A1C): Hemoglobin A1C: 10.8 % — AB (ref 4.0–5.6)

## 2018-04-21 MED ORDER — GLUCOSE BLOOD VI STRP: ORAL_STRIP | 2 refills | Status: DC

## 2018-04-21 NOTE — Patient Outreach (Signed)
[facility]) Care Management  04/21/2018  ELIC VENCILL 1940/08/08 [id number]   Telephone Screen  Referral Date: 04/21/2018 Referral Source: MD office-urgent Referral Reason: " DM mgmt, med mgmt, HF" Insurance: [employer]    Voicemail message received from patient. Return call to patient. Spoke with spouse(DPR on file). Spouse reports that patient is independent with ADLs/IADLs. He is abel to drive himself to medical appts. He denies any falls.   Conditions: Pat Patient has PMH of DM, A-fib, COPD and CHF. Spouse voices that patient had PCP appt earlier today. It was dicussed that his Diabetes is worsening and not well controlled. Spouse reports that patient's A1C was 10.8. She does not recall hat it was previously. However, she admits that patient has not been doing well managing his illness. She has been helping patient check cbgs about 2x/day. She reports that cbgs are elevated and ranging in the almost 300's prior to eating. She voices that they both need more support and education on mgmt of chronic conditions. She shares that patient has scale in the home. He has occasional issues with edema but none at present. Patient taking diuretic as ordered. Per spouse patient's COPD is under control at present with current regimen. Spouse verbalizes they could use further support in mgmt of patient's multiple chronic conditions.  Medications: Spouse voices that patient is taking about 10 meds. She denies any issues affording meds a this time. She voices that patient fills med planner weekly and she helps remind patient of taking meds. She does voice that patient was not taking his insulin pen correctly. She states that MD had advised him to titrate dosage based on blood sugar. H was not doing so but was educated on this today at appt and will begin doing so. Spouse does not feel like patient needs pharmacy assistance as she reports now that they know what to do they will start  doing it.    Consent: [facility] services reviewed and discussed with patient. Verbal consent for services given.   Plan: RN CM will send [facility] community nurse referral for further assessment of care needs and mgmt /support of chronic conditions.  Enzo Montgomery, RN,BSN,CCM [place] [facility] Telephonic Care Management Coordinator Direct Phone: [phone] Toll Free: [phone] Fax: [phone]

## 2018-04-21 NOTE — Progress Notes (Signed)
Established Patient Office Visit     CC/Reason for Visit: 3 month follow up for diabetes  HPI: Jason Fisher is a 78 y.o. male who is coming in today for the above mentioned reasons. Wife is present. Past Medical History is significant for DM, CAD, BPH, Afib and COPD.  Patient says he's been taking his Tresiba insulin in the morning at 13 units each morning.  His wife checks his blood sugar each morning and says it averages in the upper 200s and at night they've been averaging in the 300s.  Denies SOB or chest pain. He has not followed the instructions that were given to him at prior visits regarding insulin titration. He has been asked to explain the titration schedule given to him and he cannot verbalize what was discussed at 2 prior visits, neither can his wife.   Past Medical/Surgical History: Past Medical History:  Diagnosis Date  . 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 BENIGN PROSTATIC HYPERTROPHY, HX OF 02/24/2007  . CAD S/P PCI    a. 2007 Inf STEMI s/p Vision BMS  2 to RCA;  b. 2009 ISR Prox RCA Stent-->with DES;  c. 09/2015 PCI: LM nl, LAD 30p/m, D1 90, LCX nl, OM1 small, 80, OM2 90 (2.5x14 Biofreedom stent), OM3 small, RCA 30p ISR, 107m ISR, 41m, 85d, EF 50-55%.  . 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 Chronic atrial fibrillation 11/06/2009   a. On Eliquis (CHA2DS2VASc = 5).  Rate controlled w/ BB and CCB.  Marland Kitchen Chronic diastolic CHF (congestive heart failure) ([place])    a. 09/2015 Echo: EF 55-60%, mildly dil LA.  . CKD (chronic kidney disease), stage III ([place])    they mentioned this to the patient but they would work on it later after the aneurysm  . COLONIC POLYPS, HX OF 11/02/2006  . COPD (chronic obstructive pulmonary disease) ([place])   . DIABETES MELLITUS, TYPE II 10/29/2006  . Diverticulosis   . History of tobacco abuse    a. Quit 09/2015.  Marland Kitchen HYPERLIPIDEMIA 10/29/2006  . Hypertensive heart disease with heart failure ([place])   . Pneumonia 09/2015   [name] 07/31/2016  .  STEMI (ST elevation myocardial infarction) [facility]) 2007   [name] 07/31/2016    Past Surgical History:  Procedure Laterality Date  . ABDOMINAL AORTIC ENDOVASCULAR STENT GRAFT N/A 07/31/2016   Procedure: ABDOMINAL AORTIC ENDOVASCULAR STENT GRAFT;  Surgeon: Serafina Mitchell, MD;  Location: [place];  Service: Vascular;  Laterality: N/A;  . CARDIAC CATHETERIZATION N/A 09/26/2015   Procedure: Left Heart Cath and Coronary Angiography;  Surgeon: Peter M Martinique, MD;  Location: [place] CV LAB;  Service: Cardiovascular;  Laterality: N/A;  . CARDIAC CATHETERIZATION N/A 09/26/2015   Procedure: Coronary Stent Intervention;  Surgeon: Peter M Martinique, MD;  Location: [place] [facility];  Service: Cardiovascular;  Laterality: N/A;  . CATARACT EXTRACTION Bilateral   . COLONOSCOPY    . CORONARY ANGIOPLASTY WITH STENT PLACEMENT  2007   Inferior STEMI 2007 - RCA PCI with 2 Vision BMS; Abnormal Myoview in 2009 - pRCA ins-stent restenosis & unstented disease - DES PCI Promus 3.0 x 20 mm (3.5 mm)  . ENDOVASCULAR REPAIR/STENT GRAFT  07/31/2016  . INCISION AND DRAINAGE PERIRECTAL ABSCESS N/A 11/06/2015   Procedure: IRRIGATION AND DEBRIDEMENT PERIRECTAL ABSCESS, POSSIBLE HEMORRHOIDECTOMY;  Surgeon: Coralie Keens, MD;  Location: [facility];  Service: General;  Laterality: N/A;  . ORIF SHOULDER FRACTURE Right    "fell out of back end of a truck"  Social History:  reports that he quit smoking about 2 years ago. His smoking use included cigarettes. He has a 49.00 pack-year smoking history. He has never used smokeless tobacco. He reports that he does not drink alcohol or use drugs.  Allergies: Allergies  Allergen Reactions  . Contrast Media [Iodinated Diagnostic Agents] Anaphylaxis    Family History:  Family History  Problem Relation Age of Onset  . Heart attack Mother   . Diabetes Mother   . Heart attack Father   . Hypertension Father   . Heart attack Brother   . Diabetes Brother      Current Outpatient  Medications:  .  apixaban (ELIQUIS) 5 MG TABS tablet, Take 1 tablet (5 mg total) by mouth 2 (two) times daily., Disp: 60 tablet, Rfl: 4 .  atorvastatin (LIPITOR) 40 MG tablet, Take 1 tablet (40 mg total) by mouth daily., Disp: 90 tablet, Rfl: 3 .  clopidogrel (PLAVIX) 75 MG tablet, Take 1 tablet (75 mg total) by mouth daily., Disp: 90 tablet, Rfl: 3 .  diltiazem (CARDIZEM CD) 360 MG 24 hr capsule, Take 1 capsule (360 mg total) by mouth daily., Disp: 90 capsule, Rfl: 4 .  furosemide (LASIX) 80 MG tablet, Take 1 tablet (80 mg total) by mouth daily., Disp: 180 tablet, Rfl: 1 .  glucose blood (ACCU-CHEK AVIVA PLUS) test strip, CHECK BLOOD SUGAR ONCE DAILY.  Dx E11.51, Disp: 100 each, Rfl: 2 .  Glycopyrrolate-Formoterol (BEVESPI AEROSPHERE) 9-4.8 MCG/ACT AERO, Inhale 2 puffs into the lungs 2 (two) times daily., Disp: 1 Inhaler, Rfl: 5 .  hydrALAZINE (APRESOLINE) 25 MG tablet, Take 1 tablet (25 mg total) by mouth 3 (three) times daily., Disp: 270 tablet, Rfl: 3 .  insulin degludec (TRESIBA FLEXTOUCH) 100 UNIT/ML SOPN FlexTouch Pen, Inject 0.16 mLs (16 Units total) into the skin daily., Disp: 15 mL, Rfl: 2 .  levalbuterol (XOPENEX HFA) 45 MCG/ACT inhaler, Inhale 1-2 puffs into the lungs every 6 (six) hours as needed for wheezing., Disp: 1 Inhaler, Rfl: 5 .  metFORMIN (GLUCOPHAGE) 1000 MG tablet, TAKE 1 TABLET TWICE DAILY WITH A MEAL, Disp: 180 tablet, Rfl: 4 .  metoprolol tartrate (LOPRESSOR) 100 MG tablet, Take 1 tablet (100 mg total) by mouth 2 (two) times daily., Disp: 180 tablet, Rfl: 4 .  nitroGLYCERIN (NITROSTAT) 0.4 MG SL tablet, Place 1 tablet (0.4 mg total) under the tongue every 5 (five) minutes as needed for chest pain. X 3 doses, Disp: 25 tablet, Rfl: 4 .  polyethylene glycol (MIRALAX / GLYCOLAX) packet, Take 17 g by mouth daily as needed for mild constipation., Disp: , Rfl:   Review of Systems: Constitutional: Denies fever, chills, diaphoresis, appetite change and fatigue.  HEENT: Denies  photophobia, eye pain, redness, hearing loss, ear pain, congestion, sore throat, rhinorrhea, sneezing, mouth sores, trouble swallowing, neck pain, neck stiffness and tinnitus.   Respiratory: Denies SOB, DOE, cough, chest tightness,  and wheezing.   Cardiovascular: Denies chest pain, palpitations and leg swelling.  Gastrointestinal: Denies nausea, vomiting, abdominal pain, diarrhea, constipation, blood in stool and abdominal distention.  Genitourinary: Denies dysuria, urgency, frequency, hematuria, flank pain and difficulty urinating.  Endocrine: Denies: hot or cold intolerance, sweats, changes in hair or nails, polyuria, polydipsia. Musculoskeletal: Denies myalgias, back pain, joint swelling, arthralgias and gait problem.  Skin: Denies pallor, rash and wound.  Neurological: Denies dizziness, seizures, syncope, weakness, light-headedness, numbness and headaches.  Hematological: Denies adenopathy. Easy bruising, personal or family bleeding history  Psychiatric/Behavioral: Denies suicidal ideation, mood changes, confusion, nervousness,  sleep disturbance and agitation   Physical Exam: Vitals:   04/21/18 1033  BP: 130/90  Pulse: 77  Temp: 97.6 F (36.4 C)  TempSrc: Oral  SpO2: 96%  Weight: 189 lb 4.8 oz (85.9 kg)    Body mass index is 27.95 kg/m.   Constitutional: NAD, calm, comfortable Eyes: PERRL, lids and conjunctivae normal Respiratory: clear to auscultation bilaterally, no wheezing, no crackles. Normal respiratory effort. No accessory muscle use.  Cardiovascular: Regular rate and rhythm, no murmurs / rubs / gallops. No extremity edema. 2+ pedal pulses.  Psychiatric: Normal judgment and insight. Alert and oriented x 3. Normal mood.    Impression and Plan:  DM (diabetes mellitus), type 2 with peripheral vascular complications (HCC) -  -W0J is 10.8 today. -had a lengthy conversation with patient and his wife about insulin titrating and checking his blood sugars each morning.  Using teachback method, he and wife are able to verbalize understanding. -Because I am not confident of his level of understanding, will arrange [facility] to assist with further DM education and insulin administration. -follow up in 3 months -handout on diabetes and nutrition  Time spent: 25 minutes. Greater than 50% of this time was spent in direct contact with the patient and his wife, coordinating care and discussing relevant ongoing clinical issues, including importance of good diabetic control and explaining insulin titration schedule using the teach back method..      Patient Instructions  Good seeing you this morning.  Instructions: -Check your blood sugar each morning before eating breakfast, every third day that your blood sugar is over 150, you need to increase your Tresiba by 3 units.  Start at 16 units tonight. If you have even one blood sugar below 150 keep at the same dose. If your sugar is below 70 eat a snack and call us right away.   -Write down your morning blood sugars each morning and bring to your next appointment in 3 months.  Diabetes Mellitus and Nutrition, Adult When you have diabetes (diabetes mellitus), it is very important to have healthy eating habits because your blood sugar (glucose) levels are greatly affected by what you eat and drink. Eating healthy foods in the appropriate amounts, at about the same times every day, can help you:  Control your blood glucose.  Lower your risk of heart disease.  Improve your blood pressure.  Reach or maintain a healthy weight. Every person with diabetes is different, and each person has different needs for a meal plan. Your health care provider may recommend that you work with a diet and nutrition specialist (dietitian) to make a meal plan that is best for you. Your meal plan may vary depending on factors such as:  The calories you need.  The medicines you take.  Your weight.  Your blood glucose, blood pressure, and  cholesterol levels.  Your activity level.  Other health conditions you have, such as heart or kidney disease. How do carbohydrates affect me? Carbohydrates, also called carbs, affect your blood glucose level more than any other type of food. Eating carbs naturally raises the amount of glucose in your blood. Carb counting is a method for keeping track of how many carbs you eat. Counting carbs is important to keep your blood glucose at a healthy level, especially if you use insulin or take certain oral diabetes medicines. It is important to know how many carbs you can safely have in each meal. This is different for every person. Your dietitian can help you calculate  how many carbs you should have at each meal and for each snack. Foods that contain carbs include:  Bread, cereal, rice, pasta, and crackers.  Potatoes and corn.  Peas, beans, and lentils.  Milk and yogurt.  Fruit and juice.  Desserts, such as cakes, cookies, ice cream, and candy. How does alcohol affect me? Alcohol can cause a sudden decrease in blood glucose (hypoglycemia), especially if you use insulin or take certain oral diabetes medicines. Hypoglycemia can be a life-threatening condition. Symptoms of hypoglycemia (sleepiness, dizziness, and confusion) are similar to symptoms of having too much alcohol. If your health care provider says that alcohol is safe for you, follow these guidelines:  Limit alcohol intake to no more than 1 drink per day for nonpregnant women and 2 drinks per day for men. One drink equals 12 oz of beer, 5 oz of wine, or 1 oz of hard liquor.  Do not drink on an empty stomach.  Keep yourself hydrated with water, diet soda, or unsweetened iced tea.  Keep in mind that regular soda, juice, and other mixers may contain a lot of sugar and must be counted as carbs. What are tips for following this plan?  Reading food labels  Start by checking the serving size on the "Nutrition Facts" label of packaged  foods and drinks. The amount of calories, carbs, fats, and other nutrients listed on the label is based on one serving of the item. Many items contain more than one serving per package.  Check the total grams (g) of carbs in one serving. You can calculate the number of servings of carbs in one serving by dividing the total carbs by 15. For example, if a food has 30 g of total carbs, it would be equal to 2 servings of carbs.  Check the number of grams (g) of saturated and trans fats in one serving. Choose foods that have low or no amount of these fats.  Check the number of milligrams (mg) of salt (sodium) in one serving. Most people should limit total sodium intake to less than 2,300 mg per day.  Always check the nutrition information of foods labeled as "low-fat" or "nonfat". These foods may be higher in added sugar or refined carbs and should be avoided.  Talk to your dietitian to identify your daily goals for nutrients listed on the label. Shopping  Avoid buying canned, premade, or processed foods. These foods tend to be high in fat, sodium, and added sugar.  Shop around the outside edge of the grocery store. This includes fresh fruits and vegetables, bulk grains, fresh meats, and fresh dairy. Cooking  Use low-heat cooking methods, such as baking, instead of high-heat cooking methods like deep frying.  Cook using healthy oils, such as olive, canola, or sunflower oil.  Avoid cooking with butter, cream, or high-fat meats. Meal planning  Eat meals and snacks regularly, preferably at the same times every day. Avoid going long periods of time without eating.  Eat foods high in fiber, such as fresh fruits, vegetables, beans, and whole grains. Talk to your dietitian about how many servings of carbs you can eat at each meal.  Eat 4-6 ounces (oz) of lean protein each day, such as lean meat, chicken, fish, eggs, or tofu. One oz of lean protein is equal to: ? 1 oz of meat, chicken, or fish. ? 1  egg. ?  cup of tofu.  Eat some foods each day that contain healthy fats, such as avocado, nuts, seeds, and fish.  Lifestyle  Check your blood glucose regularly.  Exercise regularly as told by your health care provider. This may include: ? 150 minutes of moderate-intensity or vigorous-intensity exercise each week. This could be brisk walking, biking, or water aerobics. ? Stretching and doing strength exercises, such as yoga or weightlifting, at least 2 times a week.  Take medicines as told by your health care provider.  Do not use any products that contain nicotine or tobacco, such as cigarettes and e-cigarettes. If you need help quitting, ask your health care provider.  Work with a Social worker or diabetes educator to identify strategies to manage stress and any emotional and social challenges. Questions to ask a health care provider  Do I need to meet with a diabetes educator?  Do I need to meet with a dietitian?  What number can I call if I have questions?  When are the best times to check my blood glucose? Where to find more information:  American Diabetes Association: diabetes.org  Academy of Nutrition and Dietetics: www.eatright.[employer] of Diabetes and Digestive and Kidney Diseases (NIH): [employer] Summary  A healthy meal plan will help you control your blood glucose and maintain a healthy lifestyle.  Working with a diet and nutrition specialist (dietitian) can help you make a meal plan that is best for you.  Keep in mind that carbohydrates (carbs) and alcohol have immediate effects on your blood glucose levels. It is important to count carbs and to use alcohol carefully. This information is not intended to replace advice given to you by your health care provider. Make sure you discuss any questions you have with your health care provider. Document Released: 10/16/2004 Document Revised: 08/19/2016 Document Reviewed: 02/24/2016 Elsevier Interactive  Patient Education  2019 [place], RN DNP Student [facility]

## 2018-04-21 NOTE — Patient Outreach (Signed)
[facility]) Care Management  04/21/2018  AVANTE CARNEIRO 06-Jan-1941 [id number]   Telephone Screen  Referral Date: 04/21/2018 Referral Source: MD office-urgent Referral Reason: " DM mgmt, med mgmt, HF" Insurance: [facility] Medicare   Outreach attempt #1 to patient. No answer. RN CM left HIPAA compliant voicemail message along with contact info.    Plan: RN CM will make outreach attempt to patient within 3-4 business days. RN CM will send unsuccessful outreach letter to patient.   Enzo Montgomery, RN,BSN,CCM [place] [facility] Telephonic Care Management Coordinator Direct Phone: [phone] Toll Free: [phone] Fax: [phone]

## 2018-04-21 NOTE — Patient Instructions (Addendum)
Good seeing you this morning.  Instructions: -Check your blood sugar each morning before eating breakfast, every third day that your blood sugar is over 150, you need to increase your Tresiba by 3 units.  Start at 16 units tonight. If you have even one blood sugar below 150 keep at the same dose. If your sugar is below 70 eat a snack and call us right away.   -Write down your morning blood sugars each morning and bring to your next appointment in 3 months.  Diabetes Mellitus and Nutrition, Adult When you have diabetes (diabetes mellitus), it is very important to have healthy eating habits because your blood sugar (glucose) levels are greatly affected by what you eat and drink. Eating healthy foods in the appropriate amounts, at about the same times every day, can help you:  Control your blood glucose.  Lower your risk of heart disease.  Improve your blood pressure.  Reach or maintain a healthy weight. Every person with diabetes is different, and each person has different needs for a meal plan. Your health care provider may recommend that you work with a diet and nutrition specialist (dietitian) to make a meal plan that is best for you. Your meal plan may vary depending on factors such as:  The calories you need.  The medicines you take.  Your weight.  Your blood glucose, blood pressure, and cholesterol levels.  Your activity level.  Other health conditions you have, such as heart or kidney disease. How do carbohydrates affect me? Carbohydrates, also called carbs, affect your blood glucose level more than any other type of food. Eating carbs naturally raises the amount of glucose in your blood. Carb counting is a method for keeping track of how many carbs you eat. Counting carbs is important to keep your blood glucose at a healthy level, especially if you use insulin or take certain oral diabetes medicines. It is important to know how many carbs you can safely have in each meal. This is  different for every person. Your dietitian can help you calculate how many carbs you should have at each meal and for each snack. Foods that contain carbs include:  Bread, cereal, rice, pasta, and crackers.  Potatoes and corn.  Peas, beans, and lentils.  Milk and yogurt.  Fruit and juice.  Desserts, such as cakes, cookies, ice cream, and candy. How does alcohol affect me? Alcohol can cause a sudden decrease in blood glucose (hypoglycemia), especially if you use insulin or take certain oral diabetes medicines. Hypoglycemia can be a life-threatening condition. Symptoms of hypoglycemia (sleepiness, dizziness, and confusion) are similar to symptoms of having too much alcohol. If your health care provider says that alcohol is safe for you, follow these guidelines:  Limit alcohol intake to no more than 1 drink per day for nonpregnant women and 2 drinks per day for men. One drink equals 12 oz of beer, 5 oz of wine, or 1 oz of hard liquor.  Do not drink on an empty stomach.  Keep yourself hydrated with water, diet soda, or unsweetened iced tea.  Keep in mind that regular soda, juice, and other mixers may contain a lot of sugar and must be counted as carbs. What are tips for following this plan?  Reading food labels  Start by checking the serving size on the "Nutrition Facts" label of packaged foods and drinks. The amount of calories, carbs, fats, and other nutrients listed on the label is based on one serving of the item. Many  items contain more than one serving per package.  Check the total grams (g) of carbs in one serving. You can calculate the number of servings of carbs in one serving by dividing the total carbs by 15. For example, if a food has 30 g of total carbs, it would be equal to 2 servings of carbs.  Check the number of grams (g) of saturated and trans fats in one serving. Choose foods that have low or no amount of these fats.  Check the number of milligrams (mg) of salt  (sodium) in one serving. Most people should limit total sodium intake to less than 2,300 mg per day.  Always check the nutrition information of foods labeled as "low-fat" or "nonfat". These foods may be higher in added sugar or refined carbs and should be avoided.  Talk to your dietitian to identify your daily goals for nutrients listed on the label. Shopping  Avoid buying canned, premade, or processed foods. These foods tend to be high in fat, sodium, and added sugar.  Shop around the outside edge of the grocery store. This includes fresh fruits and vegetables, bulk grains, fresh meats, and fresh dairy. Cooking  Use low-heat cooking methods, such as baking, instead of high-heat cooking methods like deep frying.  Cook using healthy oils, such as olive, canola, or sunflower oil.  Avoid cooking with butter, cream, or high-fat meats. Meal planning  Eat meals and snacks regularly, preferably at the same times every day. Avoid going long periods of time without eating.  Eat foods high in fiber, such as fresh fruits, vegetables, beans, and whole grains. Talk to your dietitian about how many servings of carbs you can eat at each meal.  Eat 4-6 ounces (oz) of lean protein each day, such as lean meat, chicken, fish, eggs, or tofu. One oz of lean protein is equal to: ? 1 oz of meat, chicken, or fish. ? 1 egg. ?  cup of tofu.  Eat some foods each day that contain healthy fats, such as avocado, nuts, seeds, and fish. Lifestyle  Check your blood glucose regularly.  Exercise regularly as told by your health care provider. This may include: ? 150 minutes of moderate-intensity or vigorous-intensity exercise each week. This could be brisk walking, biking, or water aerobics. ? Stretching and doing strength exercises, such as yoga or weightlifting, at least 2 times a week.  Take medicines as told by your health care provider.  Do not use any products that contain nicotine or tobacco, such as  cigarettes and e-cigarettes. If you need help quitting, ask your health care provider.  Work with a Social worker or diabetes educator to identify strategies to manage stress and any emotional and social challenges. Questions to ask a health care provider  Do I need to meet with a diabetes educator?  Do I need to meet with a dietitian?  What number can I call if I have questions?  When are the best times to check my blood glucose? Where to find more information:  American Diabetes Association: diabetes.org  Academy of Nutrition and Dietetics: www.eatright.[employer] of Diabetes and Digestive and Kidney Diseases (NIH): [employer] Summary  A healthy meal plan will help you control your blood glucose and maintain a healthy lifestyle.  Working with a diet and nutrition specialist (dietitian) can help you make a meal plan that is best for you.  Keep in mind that carbohydrates (carbs) and alcohol have immediate effects on your blood glucose levels. It is important  to count carbs and to use alcohol carefully. This information is not intended to replace advice given to you by your health care provider. Make sure you discuss any questions you have with your health care provider. Document Released: 10/16/2004 Document Revised: 08/19/2016 Document Reviewed: 02/24/2016 Elsevier Interactive Patient Education  2019 [employer].

## 2018-04-25 ENCOUNTER — Other Ambulatory Visit: Payer: Self-pay | Admitting: *Deleted

## 2018-04-25 NOTE — Patient Outreach (Signed)
Referral received from [facility] telephonic RN (MD office sent referral for diabetes teaching and pt not having understanding of insulin dosage) MD note also states home health to be ordered for teaching. Pt with diagnosis DM type 2 (AIC 10.8 04/21/18), history CHF, COPD, CKD stage 3, AAA, A-fib,Outreach call to pt for assessment, spoke with pt, also spoke with wife (permission given by pt), HIPAA verified, [facility] program explained and informed pt no home visits can be completed at present due to [place], wife states pt "looks after his own medicine and not sure why he didn't take the 19 units last night as instructed"  RN CM ask wife to provide more oversight with insulin dosage and give reminders. CBG is being checked daily and recorded with most recent readings since 04/22/18 as follows- 217, 222, 205, 205 but pt did not increase dosage by 3 units as instructed, see care plan- RN CM reviewed in detail.  Pt, wife feel diabetes is " the main thing that needs to be worked on right nowLandscape architect CM called primary care MD office Dr. Deniece Ree, left voicemail requesting return phone call with name of home health agency ordered as pt has not been contacted yet.  RN CM faxed barrier letter and today's visit note to primary MD office, mailed successful outreach letter to pt home with consent form, 24 hour nurse line magnet, DM teaching tools, [facility] calendar.  THN CM Care Plan Problem One     Most Recent Value  Care Plan Problem One  Knowledge deficit related to diabetes  Role Documenting the Problem One  Care Management [place] for Problem One  Active  THN Long Term Goal   Pt will verbalize/ demonstrate improve self care related to diabetes within 60 days.  THN Long Term Goal Start Date  04/25/18  Interventions for Problem One Long Term Goal  RN CM reviewed resources for diabetes such as nutrition classes, mailed diabetes booklet, [facility] calendar and reviewed recording CBG in calendar and taking to MD appointments, gave  RN CM contact number and 24 hour nurse line number as well as mailed magnet and consent form to pt.  THN CM Short Term Goal #1   Pt and caregiver will verbalize understanding insulin dosage/ titration within 30 days.  THN CM Short Term Goal #1 Start Date  04/25/18  Interventions for Short Term Goal #1  RN CM reviewed with pt and wife in detail primary care MD instructions for management of diabetes- check CBG q am fasting and log, Q third day if CBG is over 150, increase tresiba by 3 units, Start at 16 units on night of 04/21/18, If have even one CBG under 150, keep at same dose, If under 70, eat a snack and call MD asap.  Pt had 4 readings in a row all over 200 but did not increase by 3 units and not sure why he did not, pt states he will do this tonight and take 19 units.  THN CM Short Term Goal #2   Pt and caregiver will verbalize how many carbohydrates pt is to have at each meal and describe plate method within 30 days.  THN CM Short Term Goal #2 Start Date  04/25/18  Interventions for Short Term Goal #2  RN CM mailed diabtetes booklet for reference, explained plate method over the phone, reviewed how many carbohydrates allowed at each meal, reviewed foods high in carbohydrates to limit.      PLAN Outreach pt next week  Review CBG readings, insulin dosage, titration Follow up on home health  Jacqlyn Larsen [facility], [place] Coordinator [phone]

## 2018-05-02 ENCOUNTER — Other Ambulatory Visit: Payer: Self-pay | Admitting: *Deleted

## 2018-05-02 NOTE — Patient Outreach (Signed)
Outreach call to patient for follow up on CBG, insulin instructions/ dosage, no answer to home or cell phone and no option to leave voicemail.  RN CM mailed unsuccessful outreach letter to patient's home.    PLAN Outreach pt in 3-4 business days  Jacqlyn Larsen [facility], [place] [phone]

## 2018-05-06 ENCOUNTER — Other Ambulatory Visit: Payer: Self-pay | Admitting: *Deleted

## 2018-05-06 ENCOUNTER — Other Ambulatory Visit: Payer: Self-pay

## 2018-05-06 NOTE — Patient Outreach (Signed)
Outreach call to pt, spoke with pt and wife, HIPAA verified, most recent CBG readings 117, 161,190,166, and pt is currently up to 25 units tresiba,  Wife states they continue to increase q 3 days as instructed by MD and continue to check CBG fasting q am.  No new concerns voiced.  PLAN Outreach pt next week to follow up on CBG readings and insulin dosage  Jacqlyn Larsen [facility], [place] Coordinator [phone]

## 2018-05-12 ENCOUNTER — Other Ambulatory Visit: Payer: Self-pay | Admitting: *Deleted

## 2018-05-12 NOTE — Patient Outreach (Signed)
Outreach call to pt for follow up on CBG and insulin dosage, no answer to [phone], left voicemail requesting return phone call, no answer to [phone] and no option to leave voicemail.  RN CM mailed unsuccessful outreach letter to pt home.  PLAN Outreach pt in 3-4 business days  Jacqlyn Larsen [facility], [place] [phone]

## 2018-05-18 ENCOUNTER — Other Ambulatory Visit: Payer: Self-pay | Admitting: *Deleted

## 2018-05-18 NOTE — Patient Outreach (Signed)
Outreach call to pt for follow up on CBG readings and insulin dosage, no answer to either listed telephone, left voicemail requesting return phone call.  PLAN Outreach pt in 3-4 business days  Jacqlyn Larsen [facility], [place] [phone]

## 2018-05-24 ENCOUNTER — Other Ambulatory Visit: Payer: Self-pay | Admitting: *Deleted

## 2018-05-24 NOTE — Patient Outreach (Signed)
Outreach call to pt for follow up on CBG readings and insulin dosage, no answer to either phone, left voicemail requesting return phone call.  PLAN Close case in 3 days if no response from pt  Jacqlyn Larsen [facility], [place] Coordinator [phone]

## 2018-05-27 ENCOUNTER — Other Ambulatory Visit: Payer: Self-pay | Admitting: *Deleted

## 2018-05-27 ENCOUNTER — Ambulatory Visit: Payer: Self-pay

## 2018-05-27 NOTE — Telephone Encounter (Signed)
  Returned call to patient wife.  She states her husband is having allergy symptoms and would like to take zyrtec or Claritin for his symptoms. She is concerned because her husband has Hypertension, diabetes and is on blood thinners. Mrs. Kauth was advised that antihistamines can raise BP. She was advised to consult her pharmacist for there recommendations. Mrs. Oberman verbalized understanding Reason for Disposition . Caller has medication question about med not prescribed by PCP and triager unable to answer question (e.g., compatibility with other med, storage)  Answer Assessment - Initial Assessment Questions 1. SYMPTOMS: "Do you have any symptoms?"     Allergy from pollen 2. SEVERITY: If symptoms are present, ask "Are they mild, moderate or severe?"    Allergies  Is zyrtec or claritin ok to take if my husband has hypertension and diabetes.  Protocols used: MEDICATION QUESTION CALL-A-AH

## 2018-05-27 NOTE — Patient Outreach (Signed)
Case closed, unable to maintain contact with pt, RN CM faxed case closure letter to primary MD Dr. Deniece Ree and mailed case closure letter to pt home.   Case closed  Jacqlyn Larsen [facility], [place] Coordinator [phone]

## 2018-06-07 ENCOUNTER — Other Ambulatory Visit: Payer: Self-pay | Admitting: *Deleted

## 2018-07-22 ENCOUNTER — Other Ambulatory Visit: Payer: Self-pay

## 2018-07-22 ENCOUNTER — Ambulatory Visit (INDEPENDENT_AMBULATORY_CARE_PROVIDER_SITE_OTHER): Payer: Medicare Other | Admitting: Internal Medicine

## 2018-07-22 VITALS — BP 140/94 | HR 75 | Temp 97.7°F | Wt 187.7 lb

## 2018-07-22 DIAGNOSIS — I482 Chronic atrial fibrillation, unspecified: Secondary | ICD-10-CM

## 2018-07-22 DIAGNOSIS — E785 Hyperlipidemia, unspecified: Secondary | ICD-10-CM

## 2018-07-22 DIAGNOSIS — I2510 Atherosclerotic heart disease of native coronary artery without angina pectoris: Secondary | ICD-10-CM

## 2018-07-22 DIAGNOSIS — I10 Essential (primary) hypertension: Secondary | ICD-10-CM | POA: Diagnosis not present

## 2018-07-22 DIAGNOSIS — E1151 Type 2 diabetes mellitus with diabetic peripheral angiopathy without gangrene: Secondary | ICD-10-CM | POA: Diagnosis not present

## 2018-07-22 DIAGNOSIS — N183 Chronic kidney disease, stage 3 unspecified: Secondary | ICD-10-CM

## 2018-07-22 DIAGNOSIS — I714 Abdominal aortic aneurysm, without rupture, unspecified: Secondary | ICD-10-CM

## 2018-07-22 DIAGNOSIS — Z9861 Coronary angioplasty status: Secondary | ICD-10-CM

## 2018-07-22 LAB — POCT GLYCOSYLATED HEMOGLOBIN (HGB A1C): Hemoglobin A1C: 8.2 % — AB (ref 4.0–5.6)

## 2018-07-22 NOTE — Patient Instructions (Signed)
-  Nice seeing you today!!  -Great job getting your A1c down to 8.2! It was 10.8 in March. Goal is <7.0.  -Continue titrating [name] as you are every third day.  -Very important that you schedule follow up appointments with the cardiologist (heart doctor) and vascular surgeon (aneurysm doctor).  -Check you blood pressure every day and bring those numbers AND your sugar numbers when you see me again in 3 months.  -Schedule follow up with me in 3 months.

## 2018-07-22 NOTE — Progress Notes (Signed)
Established Patient Office Visit     CC/Reason for Visit: 3 month follow up  HPI: Jason Fisher is a 78 y.o. male who is coming in today for the above mentioned reasons. Past Medical History is significant for: Chronic diastolic heart failure, benign essential hypertension, AAA, type 2 diabetes, chronic atrial fibrillation, chronic kidney disease stage III. He sees me mainly for diabetic care. We have been working on insulin titration and getting A1c down. There were some comprehension barriers initially. We had a HHRN reach out to him. He is doing better. Unfortunately, he does not bring his CBG log in today as requested. States CBGs are between 120-160 on average. No hypoglycemic events. He has improved his diet.   Past Medical/Surgical History: Past Medical History:  Diagnosis Date  . 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 BENIGN PROSTATIC HYPERTROPHY, HX OF 02/24/2007  . CAD S/P PCI    a. 2007 Inf STEMI s/p Vision BMS  2 to RCA;  b. 2009 ISR Prox RCA Stent-->with DES;  c. 09/2015 PCI: LM nl, LAD 30p/m, D1 90, LCX nl, OM1 small, 80, OM2 90 (2.5x14 Biofreedom stent), OM3 small, RCA 30p ISR, 51m ISR, 93m, 85d, EF 50-55%.  . 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 Chronic atrial fibrillation 11/06/2009   a. On Eliquis (CHA2DS2VASc = 5).  Rate controlled w/ BB and CCB.  Marland Kitchen Chronic diastolic CHF (congestive heart failure) ([place])    a. 09/2015 Echo: EF 55-60%, mildly dil LA.  . CKD (chronic kidney disease), stage III ([place])    they mentioned this to the patient but they would work on it later after the aneurysm  . COLONIC POLYPS, HX OF 11/02/2006  . COPD (chronic obstructive pulmonary disease) ([place])   . DIABETES MELLITUS, TYPE II 10/29/2006  . Diverticulosis   . History of tobacco abuse    a. Quit 09/2015.  Marland Kitchen HYPERLIPIDEMIA 10/29/2006  . Hypertensive heart disease with heart failure ([place])   . Pneumonia 09/2015   [name] 07/31/2016  . STEMI (ST elevation myocardial infarction) [facility]) 2007    [name] 07/31/2016    Past Surgical History:  Procedure Laterality Date  . ABDOMINAL AORTIC ENDOVASCULAR STENT GRAFT N/A 07/31/2016   Procedure: ABDOMINAL AORTIC ENDOVASCULAR STENT GRAFT;  Surgeon: Serafina Mitchell, MD;  Location: [place];  Service: Vascular;  Laterality: N/A;  . CARDIAC CATHETERIZATION N/A 09/26/2015   Procedure: Left Heart Cath and Coronary Angiography;  Surgeon: Peter M Martinique, MD;  Location: [place] CV LAB;  Service: Cardiovascular;  Laterality: N/A;  . CARDIAC CATHETERIZATION N/A 09/26/2015   Procedure: Coronary Stent Intervention;  Surgeon: Peter M Martinique, MD;  Location: [place] CV LAB;  Service: Cardiovascular;  Laterality: N/A;  . CATARACT EXTRACTION Bilateral   . COLONOSCOPY    . CORONARY ANGIOPLASTY WITH STENT PLACEMENT  2007   Inferior STEMI 2007 - RCA PCI with 2 Vision BMS; Abnormal Myoview in 2009 - pRCA ins-stent restenosis & unstented disease - DES PCI Promus 3.0 x 20 mm (3.5 mm)  . ENDOVASCULAR REPAIR/STENT GRAFT  07/31/2016  . INCISION AND DRAINAGE PERIRECTAL ABSCESS N/A 11/06/2015   Procedure: IRRIGATION AND DEBRIDEMENT PERIRECTAL ABSCESS, POSSIBLE HEMORRHOIDECTOMY;  Surgeon: Coralie Keens, MD;  Location: [place];  Service: General;  Laterality: N/A;  . ORIF SHOULDER FRACTURE Right    "fell out of back end of a truck"    Social History:  reports that he quit smoking about 2 years ago. His smoking use included cigarettes. He has  a 49.00 pack-year smoking history. He has never used smokeless tobacco. He reports that he does not drink alcohol or use drugs.  Allergies: Allergies  Allergen Reactions  . Contrast Media [Iodinated Diagnostic Agents] Anaphylaxis    Family History:  Family History  Problem Relation Age of Onset  . Heart attack Mother   . Diabetes Mother   . Heart attack Father   . Hypertension Father   . Heart attack Brother   . Diabetes Brother      Current Outpatient Medications:  .  apixaban (ELIQUIS) 5 MG TABS tablet, Take 1  tablet (5 mg total) by mouth 2 (two) times daily., Disp: 60 tablet, Rfl: 4 .  atorvastatin (LIPITOR) 40 MG tablet, Take 1 tablet (40 mg total) by mouth daily., Disp: 90 tablet, Rfl: 3 .  clopidogrel (PLAVIX) 75 MG tablet, Take 1 tablet (75 mg total) by mouth daily., Disp: 90 tablet, Rfl: 3 .  diltiazem (CARDIZEM CD) 360 MG 24 hr capsule, Take 1 capsule (360 mg total) by mouth daily., Disp: 90 capsule, Rfl: 4 .  furosemide (LASIX) 80 MG tablet, Take 1 tablet (80 mg total) by mouth daily., Disp: 180 tablet, Rfl: 1 .  glucose blood (ACCU-CHEK AVIVA PLUS) test strip, CHECK BLOOD SUGAR ONCE DAILY.  Dx E11.51, Disp: 100 each, Rfl: 2 .  Glycopyrrolate-Formoterol (BEVESPI AEROSPHERE) 9-4.8 MCG/ACT AERO, Inhale 2 puffs into the lungs 2 (two) times daily., Disp: 1 Inhaler, Rfl: 5 .  hydrALAZINE (APRESOLINE) 25 MG tablet, Take 1 tablet (25 mg total) by mouth 3 (three) times daily., Disp: 270 tablet, Rfl: 3 .  insulin degludec (TRESIBA FLEXTOUCH) 100 UNIT/ML SOPN FlexTouch Pen, Inject 0.16 mLs (16 Units total) into the skin daily., Disp: 15 mL, Rfl: 2 .  levalbuterol (XOPENEX HFA) 45 MCG/ACT inhaler, Inhale 1-2 puffs into the lungs every 6 (six) hours as needed for wheezing., Disp: 1 Inhaler, Rfl: 5 .  metFORMIN (GLUCOPHAGE) 1000 MG tablet, TAKE 1 TABLET TWICE DAILY WITH A MEAL, Disp: 180 tablet, Rfl: 4 .  metoprolol tartrate (LOPRESSOR) 100 MG tablet, Take 1 tablet (100 mg total) by mouth 2 (two) times daily., Disp: 180 tablet, Rfl: 4 .  nitroGLYCERIN (NITROSTAT) 0.4 MG SL tablet, Place 1 tablet (0.4 mg total) under the tongue every 5 (five) minutes as needed for chest pain. X 3 doses, Disp: 25 tablet, Rfl: 4 .  polyethylene glycol (MIRALAX / GLYCOLAX) packet, Take 17 g by mouth daily as needed for mild constipation., Disp: , Rfl:   Review of Systems:  Constitutional: Denies fever, chills, diaphoresis, appetite change and fatigue.  HEENT: Denies photophobia, eye pain, redness, hearing loss, ear pain,  congestion, sore throat, rhinorrhea, sneezing, mouth sores, trouble swallowing, neck pain, neck stiffness and tinnitus.   Respiratory: Denies SOB, DOE, cough, chest tightness,  and wheezing.   Cardiovascular: Denies chest pain, palpitations and leg swelling.  Gastrointestinal: Denies nausea, vomiting, abdominal pain, diarrhea, constipation, blood in stool and abdominal distention.  Genitourinary: Denies dysuria, urgency, frequency, hematuria, flank pain and difficulty urinating.  Endocrine: Denies: hot or cold intolerance, sweats, changes in hair or nails, polyuria, polydipsia. Musculoskeletal: Denies myalgias, back pain, joint swelling, arthralgias and gait problem.  Skin: Denies pallor, rash and wound.  Neurological: Denies dizziness, seizures, syncope, weakness, light-headedness, numbness and headaches.  Hematological: Denies adenopathy. Easy bruising, personal or family bleeding history  Psychiatric/Behavioral: Denies suicidal ideation, mood changes, confusion, nervousness, sleep disturbance and agitation    Physical Exam: Vitals:   07/22/18 1008  BP: (!) 140/94  Pulse: 75  Temp: 97.7 F (36.5 C)  TempSrc: Oral  SpO2: 97%  Weight: 187 lb 11.2 oz (85.1 kg)    Body mass index is 27.72 kg/m.   Constitutional: NAD, calm, comfortable Eyes: PERRL, lids and conjunctivae normal, wears corrective lenses ENMT: Mucous membranes are moist.  Respiratory: clear to auscultation bilaterally, no wheezing, no crackles. Normal respiratory effort. No accessory muscle use.  Cardiovascular: irregular rate and rhythm, no murmurs / rubs / gallops. No extremity edema. 2+ pedal pulses. No carotid bruits.  Abdomen: no tenderness, no masses palpated. No hepatosplenomegaly. Bowel sounds positive.  Musculoskeletal: no clubbing / cyanosis. No joint deformity upper and lower extremities. Good ROM, no contractures. Normal muscle tone.  Neurologic: grossly intact and nonfocal Psychiatric: Normal judgment and  insight. Alert and oriented x 3. Normal mood.    Impression and Plan:  DM (diabetes mellitus), type 2 with peripheral vascular complications (HCC)  -Y1O down to 8.2 from 10.8 in March. -Continue metformin and tresiba with titration schedule for a goal A1c of <7 as long as no hypoglycemic events.  Dyslipidemia -Last LDL 94 in 11/19; not at goal. -Will consider increasing statin if not addressed with cardiology.  Essential hypertension -Not controlled today, but has been in past. -Advised ambulatory BP measurements and follow up in 3 months.  CAD S/P percutaneous coronary angioplasty Chronic atrial fibrillation ([place]): CHA2DS2Vasc = 5. On Warfarin. Rate control Chronic Diastolic CHF -Has not seen cardiology in over 6 months. -Has follow up soon. -Anticoagulated on Eliquis, he is rate controlled today. -CHF is compensated.  Abdominal aortic aneurysm (AAA) without rupture ([place]) -S/p repair. -Needs follow up with Dr. Trula Slade; instructed to scheduled appointment.  CKD (chronic kidney disease), stage III (HCC) -Baseline Cr around 1.3-1.4.    Patient Instructions  -Nice seeing you today!!  -Great job getting your A1c down to 8.2! It was 10.8 in March. Goal is <7.0.  -Continue titrating [name] as you are every third day.  -Very important that you schedule follow up appointments with the cardiologist (heart doctor) and vascular surgeon (aneurysm doctor).  -Check you blood pressure every day and bring those numbers AND your sugar numbers when you see me again in 3 months.  -Schedule follow up with me in 3 months.     Lelon Frohlich, MD [place] [facility]

## 2018-09-01 ENCOUNTER — Encounter: Payer: Self-pay | Admitting: *Deleted

## 2018-09-01 ENCOUNTER — Telehealth: Payer: Self-pay | Admitting: *Deleted

## 2018-09-01 DIAGNOSIS — Z006 Encounter for examination for normal comparison and control in clinical research program: Secondary | ICD-10-CM

## 2018-09-01 NOTE — Research (Signed)
[employer] II Research study 36 month telephone follow up completed. Patient denies any SAE's or adverse events. He denies chest pain, states he has been doing really well. I reviewed his medications with him. I also thanked him for his participation and informed him that he Stent has been showing superiority with other polymer free drug coated stents.

## 2018-09-01 NOTE — Telephone Encounter (Signed)
Left message for patient to call research office for the last telephone follow up for the [place] study.

## 2018-09-05 ENCOUNTER — Encounter: Payer: Self-pay | Admitting: General Practice

## 2018-09-05 ENCOUNTER — Ambulatory Visit (INDEPENDENT_AMBULATORY_CARE_PROVIDER_SITE_OTHER): Payer: Medicare Other | Admitting: General Practice

## 2018-09-05 ENCOUNTER — Other Ambulatory Visit: Payer: Self-pay

## 2018-09-05 VITALS — BP 124/80 | HR 52 | Temp 96.8°F | Ht 70.0 in | Wt 187.0 lb

## 2018-09-05 DIAGNOSIS — Z8679 Personal history of other diseases of the circulatory system: Secondary | ICD-10-CM

## 2018-09-05 DIAGNOSIS — I482 Chronic atrial fibrillation, unspecified: Secondary | ICD-10-CM

## 2018-09-05 DIAGNOSIS — I5032 Chronic diastolic (congestive) heart failure: Secondary | ICD-10-CM | POA: Diagnosis not present

## 2018-09-05 DIAGNOSIS — Z9889 Other specified postprocedural states: Secondary | ICD-10-CM | POA: Diagnosis not present

## 2018-09-05 DIAGNOSIS — E785 Hyperlipidemia, unspecified: Secondary | ICD-10-CM | POA: Diagnosis not present

## 2018-09-05 DIAGNOSIS — I2510 Atherosclerotic heart disease of native coronary artery without angina pectoris: Secondary | ICD-10-CM | POA: Diagnosis not present

## 2018-09-05 NOTE — Patient Instructions (Addendum)
Medication Instructions:  Your physician recommends that you continue on your current medications as directed. Please refer to the Current Medication list given to you today.  If you need a refill on your cardiac medications before your next appointment, please call your pharmacy.   Lab work: NONE ordered at this time of appointment   If you have labs (blood work) drawn today and your tests are completely normal, you will receive your results only by: Marland Kitchen MyChart Message (if you have MyChart) OR . A paper copy in the mail If you have any lab test that is abnormal or we need to change your treatment, we will call you to review the results.  Testing/Procedures: NONE ordered at this time of appointment   Follow-Up: At [facility], you and your health needs are our priority.  As part of our continuing mission to provide you with exceptional heart care, we have created designated Provider Care Teams.  These Care Teams include your primary Cardiologist (physician) and Advanced Practice Providers (APPs -  Physician Assistants and Nurse Practitioners) who all work together to provide you with the care you need, when you need it. You will need a follow up appointment in 6 months.  Please call our office 2 months in advance to schedule this appointment.  You may see Peter Martinique, MD or one of the following Advanced Practice Providers on your designated Care Team: [place], [place] . Fabian Sharp, PA-C  Any Other Special Instructions Will Be Listed Below (If Applicable).    Low-Sodium Eating Plan Sodium, which is an element that makes up salt, helps you maintain a healthy balance of fluids in your body. Too much sodium can increase your blood pressure and cause fluid and waste to be held in your body. Your health care provider or dietitian may recommend following this plan if you have high blood pressure (hypertension), kidney disease, liver disease, or heart failure. Eating less sodium can help lower  your blood pressure, reduce swelling, and protect your heart, liver, and kidneys. What are tips for following this plan? General guidelines  Most people on this plan should limit their sodium intake to 1,500-2,000 mg (milligrams) of sodium each day. Reading food labels   The Nutrition Facts label lists the amount of sodium in one serving of the food. If you eat more than one serving, you must multiply the listed amount of sodium by the number of servings.  Choose foods with less than 140 mg of sodium per serving.  Avoid foods with 300 mg of sodium or more per serving. Shopping  Look for lower-sodium products, often labeled as "low-sodium" or "no salt added."  Always check the sodium content even if foods are labeled as "unsalted" or "no salt added".  Buy fresh foods. ? Avoid canned foods and premade or frozen meals. ? Avoid canned, cured, or processed meats  Buy breads that have less than 80 mg of sodium per slice. Cooking  Eat more home-cooked food and less restaurant, buffet, and fast food.  Avoid adding salt when cooking. Use salt-free seasonings or herbs instead of table salt or sea salt. Check with your health care provider or pharmacist before using salt substitutes.  Cook with plant-based oils, such as canola, sunflower, or olive oil. Meal planning  When eating at a restaurant, ask that your food be prepared with less salt or no salt, if possible.  Avoid foods that contain MSG (monosodium glutamate). MSG is sometimes added to [place] food, bouillon, and some canned  foods. What foods are recommended? The items listed may not be a complete list. Talk with your dietitian about what dietary choices are best for you. Grains Low-sodium cereals, including oats, puffed wheat and rice, and shredded wheat. Low-sodium crackers. Unsalted rice. Unsalted pasta. Low-sodium bread. Whole-grain breads and whole-grain pasta. Vegetables Fresh or frozen vegetables. "No salt added" canned  vegetables. "No salt added" tomato sauce and paste. Low-sodium or reduced-sodium tomato and vegetable juice. Fruits Fresh, frozen, or canned fruit. Fruit juice. Meats and other protein foods Fresh or frozen (no salt added) meat, poultry, seafood, and fish. Low-sodium canned tuna and salmon. Unsalted nuts. Dried peas, beans, and lentils without added salt. Unsalted canned beans. Eggs. Unsalted nut butters. Dairy Milk. Soy milk. Cheese that is naturally low in sodium, such as ricotta cheese, fresh mozzarella, or Swiss cheese Low-sodium or reduced-sodium cheese. Cream cheese. Yogurt. Fats and oils Unsalted butter. Unsalted margarine with no trans fat. Vegetable oils such as canola or olive oils. Seasonings and other foods Fresh and dried herbs and spices. Salt-free seasonings. Low-sodium mustard and ketchup. Sodium-free salad dressing. Sodium-free light mayonnaise. Fresh or refrigerated horseradish. Lemon juice. Vinegar. Homemade, reduced-sodium, or low-sodium soups. Unsalted popcorn and pretzels. Low-salt or salt-free chips. What foods are not recommended? The items listed may not be a complete list. Talk with your dietitian about what dietary choices are best for you. Grains Instant hot cereals. Bread stuffing, pancake, and biscuit mixes. Croutons. Seasoned rice or pasta mixes. Noodle soup cups. Boxed or frozen macaroni and cheese. Regular salted crackers. Self-rising flour. Vegetables Sauerkraut, pickled vegetables, and relishes. Olives. [place] fries. Onion rings. Regular canned vegetables (not low-sodium or reduced-sodium). Regular canned tomato sauce and paste (not low-sodium or reduced-sodium). Regular tomato and vegetable juice (not low-sodium or reduced-sodium). Frozen vegetables in sauces. Meats and other protein foods Meat or fish that is salted, canned, smoked, spiced, or pickled. Bacon, ham, sausage, hotdogs, corned beef, chipped beef, packaged lunch meats, salt pork, jerky, pickled  herring, anchovies, regular canned tuna, sardines, salted nuts. Dairy Processed cheese and cheese spreads. Cheese curds. Blue cheese. Feta cheese. String cheese. Regular cottage cheese. Buttermilk. Canned milk. Fats and oils Salted butter. Regular margarine. Ghee. Bacon fat. Seasonings and other foods Onion salt, garlic salt, seasoned salt, table salt, and sea salt. Canned and packaged gravies. Worcestershire sauce. Tartar sauce. Barbecue sauce. Teriyaki sauce. Soy sauce, including reduced-sodium. Steak sauce. Fish sauce. Oyster sauce. Cocktail sauce. Horseradish that you find on the shelf. Regular ketchup and mustard. Meat flavorings and tenderizers. Bouillon cubes. Hot sauce and Tabasco sauce. Premade or packaged marinades. Premade or packaged taco seasonings. Relishes. Regular salad dressings. Salsa. Potato and tortilla chips. Corn chips and puffs. Salted popcorn and pretzels. Canned or dried soups. Pizza. Frozen entrees and pot pies. Summary  Eating less sodium can help lower your blood pressure, reduce swelling, and protect your heart, liver, and kidneys.  Most people on this plan should limit their sodium intake to 1,500-2,000 mg (milligrams) of sodium each day.  Canned, boxed, and frozen foods are high in sodium. Restaurant foods, fast foods, and pizza are also very high in sodium. You also get sodium by adding salt to food.  Try to cook at home, eat more fresh fruits and vegetables, and eat less fast food, canned, processed, or prepared foods. This information is not intended to replace advice given to you by your health care provider. Make sure you discuss any questions you have with your health care provider. Document Released: 07/11/2001 Document Revised:  01/01/2017 Document Reviewed: 01/13/2016 Elsevier Patient Education  [place].

## 2018-09-05 NOTE — Progress Notes (Signed)
Cardiology Clinic Note   Patient Name: Jason Fisher Date of Encounter: 09/05/2018  Primary Care Provider:  [name], [name], MD Primary Cardiologist:  Peter Martinique, MD  Patient Profile    Jason Fisher 78 year old male presents today for 9-month follow-up for his congestive heart failure, A. fib, AAA repair and coronary artery disease.  Past Medical History    Past Medical History:  Diagnosis Date  . 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 BENIGN PROSTATIC HYPERTROPHY, HX OF 02/24/2007  . CAD S/P PCI    a. 2007 Inf STEMI s/p Vision BMS  2 to RCA;  b. 2009 ISR Prox RCA Stent-->with DES;  c. 09/2015 PCI: LM nl, LAD 30p/m, D1 90, LCX nl, OM1 small, 80, OM2 90 (2.5x14 Biofreedom stent), OM3 small, RCA 30p ISR, 5m ISR, 8m, 85d, EF 50-55%.  . 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 Chronic atrial fibrillation 11/06/2009   a. On Eliquis (CHA2DS2VASc = 5).  Rate controlled w/ BB and CCB.  Marland Kitchen Chronic diastolic CHF (congestive heart failure) ([place])    a. 09/2015 Echo: EF 55-60%, mildly dil LA.  . CKD (chronic kidney disease), stage III ([employer])    they mentioned this to the patient but they would work on it later after the aneurysm  . COLONIC POLYPS, HX OF 11/02/2006  . COPD (chronic obstructive pulmonary disease) ([place])   . DIABETES MELLITUS, TYPE II 10/29/2006  . Diverticulosis   . History of tobacco abuse    a. Quit 09/2015.  Marland Kitchen HYPERLIPIDEMIA 10/29/2006  . Hypertensive heart disease with heart failure ([place])   . Pneumonia 09/2015   [name] 07/31/2016  . STEMI (ST elevation myocardial infarction) [facility]) 2007   [name] 07/31/2016   Past Surgical History:  Procedure Laterality Date  . ABDOMINAL AORTIC ENDOVASCULAR STENT GRAFT N/A 07/31/2016   Procedure: ABDOMINAL AORTIC ENDOVASCULAR STENT GRAFT;  Surgeon: Serafina Mitchell, MD;  Location: [facility];  Service: Vascular;  Laterality: N/A;  . CARDIAC CATHETERIZATION N/A 09/26/2015   Procedure: Left Heart Cath and Coronary Angiography;   Surgeon: Peter M Martinique, MD;  Location: [place] CV [facility];  Service: Cardiovascular;  Laterality: N/A;  . CARDIAC CATHETERIZATION N/A 09/26/2015   Procedure: Coronary Stent Intervention;  Surgeon: Peter M Martinique, MD;  Location: [place] [facility];  Service: Cardiovascular;  Laterality: N/A;  . CATARACT EXTRACTION Bilateral   . COLONOSCOPY    . CORONARY ANGIOPLASTY WITH STENT PLACEMENT  2007   Inferior STEMI 2007 - RCA PCI with 2 Vision BMS; Abnormal Myoview in 2009 - pRCA ins-stent restenosis & unstented disease - DES PCI Promus 3.0 x 20 mm (3.5 mm)  . ENDOVASCULAR REPAIR/STENT GRAFT  07/31/2016  . INCISION AND DRAINAGE PERIRECTAL ABSCESS N/A 11/06/2015   Procedure: IRRIGATION AND DEBRIDEMENT PERIRECTAL ABSCESS, POSSIBLE HEMORRHOIDECTOMY;  Surgeon: Coralie Keens, MD;  Location: [facility];  Service: General;  Laterality: N/A;  . ORIF SHOULDER FRACTURE Right    "fell out of back end of a truck"    Allergies  Allergies  Allergen Reactions  . Contrast Media [Iodinated Diagnostic Agents] Anaphylaxis    History of Present Illness    Mr. Engram has a cardiac history dating back to 2007.  He received a BMS to his RCA in 2007, a DES to RCA in 2009, and a DES to [place] in 09/2015.  He has chronic atrial fibrillation and is on Eliquis.  Mr. Schnyder also suffers from chronic diastolic heart failure.  He last saw Dr. Martinique on  03/07/2018 and was doing well.  He had stopped taking his metolazone, was taking 80 mg of Lasix daily, and was not taking a potassium supplement.  He still noticed some dyspnea but is walking daily.  His PMH also includes essential hypertension, chronic A. Fib, occlusion and stenosis of carotid artery,AAA, CHF, acute on chronic combined systolic and diastolic congestive heart failure, coronary artery disease of bypass graft of native heart with stable angina,HCAP, COPD, CKD stage III, dyslipidemia, BPH, pulmonary nodules, and allergic reaction to contrast dye.  Mr. Mckey presents to  the clinic today and states he feels well.  He states he continues to do yard work around his house and cleaning his pool daily.  He stated he has several trees around his pool and he is busy skin in the water to keep the leaves out each day.  He denies chest pain, increased shortness of breath, lower extremity edema, palpitations, fatigue, weakness, melena, hemoptysis, hematuria, presyncope, syncope, orthopnea, and PND.  Home Medications    Prior to Admission medications   Medication Sig Start Date End Date Taking? Authorizing Provider  apixaban (ELIQUIS) 5 MG TABS tablet Take 1 tablet (5 mg total) by mouth 2 (two) times daily. 09/23/17   Marletta Lor, MD  atorvastatin (LIPITOR) 40 MG tablet Take 1 tablet (40 mg total) by mouth daily. 03/07/18 03/02/19  Martinique, Peter M, MD  clopidogrel (PLAVIX) 75 MG tablet Take 1 tablet (75 mg total) by mouth daily. 09/23/17   Marletta Lor, MD  diltiazem (CARDIZEM CD) 360 MG 24 hr capsule Take 1 capsule (360 mg total) by mouth daily. 09/23/17   Marletta Lor, MD  furosemide (LASIX) 80 MG tablet Take 1 tablet (80 mg total) by mouth daily. 03/07/18   Martinique, Peter M, MD  glucose blood (ACCU-CHEK AVIVA PLUS) test strip CHECK BLOOD SUGAR ONCE DAILY.  Dx E11.51 04/21/18   [name], [name], MD  Glycopyrrolate-Formoterol (BEVESPI AEROSPHERE) 9-4.8 MCG/ACT AERO Inhale 2 puffs into the lungs 2 (two) times daily. 07/26/17   Collene Gobble, MD  hydrALAZINE (APRESOLINE) 25 MG tablet Take 1 tablet (25 mg total) by mouth 3 (three) times daily. 09/23/17 09/18/18  Marletta Lor, MD  insulin degludec (TRESIBA FLEXTOUCH) 100 UNIT/ML SOPN FlexTouch Pen Inject 0.16 mLs (16 Units total) into the skin daily. 03/09/18   [name], [name], MD  levalbuterol [facility] HFA) 45 MCG/ACT inhaler Inhale 1-2 puffs into the lungs every 6 (six) hours as needed for wheezing. 09/23/17   Marletta Lor, MD  metFORMIN (GLUCOPHAGE) 1000 MG tablet TAKE 1 TABLET  TWICE DAILY WITH A MEAL 09/23/17   Marletta Lor, MD  metoprolol tartrate (LOPRESSOR) 100 MG tablet Take 1 tablet (100 mg total) by mouth 2 (two) times daily. 09/23/17   Marletta Lor, MD  nitroGLYCERIN (NITROSTAT) 0.4 MG SL tablet Place 1 tablet (0.4 mg total) under the tongue every 5 (five) minutes as needed for chest pain. X 3 doses 05/04/16   Strader, [place] M, PA-C  polyethylene glycol (MIRALAX / GLYCOLAX) packet Take 17 g by mouth daily as needed for mild constipation.    [provider]    Family History    Family History  Problem Relation Age of Onset  . Heart attack Mother   . Diabetes Mother   . Heart attack Father   . Hypertension Father   . Heart attack Brother   . Diabetes Brother    He indicated that his mother is deceased.  He indicated that his father is deceased. He indicated that his maternal grandmother is deceased. He indicated that his maternal grandfather is deceased. He indicated that his paternal grandmother is deceased. He indicated that his paternal grandfather is deceased.  Social History    Social History   Socioeconomic History  . Marital status: Married    Spouse name: Not on file  . Number of children: 5  . Years of education: Not on file  . Highest education level: Not on file  Occupational History  . Occupation: [employer]  Social Needs  . Financial resource strain: Not on file  . Food insecurity    Worry: Not on file    Inability: Not on file  . Transportation needs    Medical: Not on file    Non-medical: Not on file  Tobacco Use  . Smoking status: Former Smoker    Packs/day: 1.00    Years: 49.00    Pack years: 49.00    Types: Cigarettes    Quit date: 09/08/2015    Years since quitting: 2.9  . Smokeless tobacco: Never Used  . Tobacco comment: passive smoker as well  Substance and Sexual Activity  . Alcohol use: No    Alcohol/week: 0.0 standard drinks  . Drug use: No  . Sexual activity: Not on file  Lifestyle   . Physical activity    Days per week: Not on file    Minutes per session: Not on file  . Stress: Not on file  Relationships  . Social Herbalist on phone: Not on file    Gets together: Not on file    Attends religious service: Not on file    Active member of club or organization: Not on file    Attends meetings of clubs or organizations: Not on file    Relationship status: Not on file  . Intimate partner violence    Fear of current or ex partner: Not on file    Emotionally abused: Not on file    Physically abused: Not on file    Forced sexual activity: Not on file  Other Topics Concern  . Not on file  Social History Narrative  . Not on file     Review of Systems    General:  No chills, fever, night sweats or weight changes.  Cardiovascular:  No chest pain, dyspnea on exertion, edema, orthopnea, palpitations, paroxysmal nocturnal dyspnea. Dermatological: No rash, lesions/masses Respiratory: No cough, dyspnea Urologic: No hematuria, dysuria Abdominal:   No nausea, vomiting, diarrhea, bright red blood per rectum, melena, or hematemesis Neurologic:  No visual changes, wkns, changes in mental status. All other systems reviewed and are otherwise negative except as noted above.  Physical Exam    VS:  BP 124/80 (BP Location: Left Arm, Patient Position: Sitting, Cuff Size: Normal)   Pulse (!) 52   Temp (!) 96.8 F (36 C)   Ht 5\' 10"  (1.778 m)   Wt 187 lb (84.8 kg)   BMI 26.83 kg/m  , BMI Body mass index is 26.83 kg/m. GEN: Well nourished, well developed, in no acute distress. HEENT: normal. Neck: Supple, no JVD, carotid bruits, or masses. Cardiac: RRR, no murmurs, rubs, or gallops. No clubbing, cyanosis, edema.  Radials/DP/PT 2+ and equal bilaterally.  Respiratory:  Respirations regular and unlabored, clear to auscultation bilaterally. GI: Soft, nontender, nondistended, BS + x 4. MS: no deformity or atrophy. Skin: warm and dry, no rash. Neuro:  Strength and  sensation are  intact. Psych: Normal affect.  Accessory Clinical Findings    ECG personally reviewed by me today-atrial fibrillation 52 bpm- No acute changes  EKG 11/03/2017: Atrial fibrillation 79 bpm  EKG 08/28/2016: Atrial fibrillation 76 bpm  Echocardiogram 11/05/2017: Study Conclusions  - Left ventricle: The cavity size was normal. There was moderate   concentric hypertrophy. Systolic function was normal. The   estimated ejection fraction was in the range of 60% to 65%. Wall   motion was normal; there were no regional wall motion   abnormalities. The study was not technically sufficient to allow   evaluation of LV diastolic dysfunction due to atrial   fibrillation. - Aortic valve: Poorly visualized. Trileaflet; moderately   thickened, moderately calcified leaflets. - Aorta: Aortic root dimension: 40 mm (ED). Ascending aortic   diameter: 38 mm (S). - Aortic root: The aortic root was mildly dilated. - Ascending aorta: The ascending aorta was mildly dilated. - Left atrium: The atrium was moderately dilated. - Right atrium: The atrium was moderately dilated. - Pulmonic valve: There was mild regurgitation. - Pulmonary arteries: Systolic pressure could not be accurately   estimated.  Echocardiogram 09/27/2015: Study Conclusions  - Left ventricle: The cavity size was normal. Systolic function was   normal. The estimated ejection fraction was in the range of 55%   to 60%. Wall motion was normal; there were no regional wall   motion abnormalities. Doppler parameters are consistent with   elevated mean left atrial filling pressure. - Left atrium: The atrium was mildly dilated.  Assessment & Plan   1.  Chronic diastolic heart failure- related to atrial fibrillation, EF 60 to 65%, Left atrium: The atrium was moderately dilated,Right atrium: The atrium was moderately dilated, Pulmonic valve: There was mild regurgitation. Continue diltiazem 360 mg daily Continue hydralazine 25 mg  tablet 3 times daily Continue metoprolol tartrate 100 mg tablet twice daily 2.  Chronic atrial fibrillation: EKG atrial fibrillation 52 bpm. Continue Eliquis 5 mg tablet twice daily Continue diltiazem 360 mg tablet daily Continue metoprolol tartrate 100 mg tablet twice daily 3.  CAD-BMS to his RCA in 2007, a DES to RCA in 2009, and a DES to [place] in 09/2015.  Continue clopidogrel 75 mg tablet daily Continue atorvastatin 40 mg tablet daily Low-sodium heart healthy diet Increase physical activity as tolerated 4.  Hyperlipidemia-12/03/2017: Cholesterol, Total 161; HDL 43; LDL Calculated 94; Triglycerides 120 Continue atorvastatin 40 mg tablet daily Monitored by PCP 5.  History of AAA repair-- Aorta: Aortic root dimension: 40 mm (ED). Ascending aortic diameter: 38 mm (S). - Aortic root: The aortic root was mildly dilated. - Ascending aorta: The ascending aorta was mildly dilated. Continue diltiazem 360 mg daily Continue hydralazine 25 mg tablet 3 times daily Continue metoprolol tartrate 100 mg tablet twice daily Followed by Dr. Trula Slade  Follow-up with Dr. Martinique in 6 months.  Deberah Pelton, NP 09/05/2018, 10:12 AM

## 2018-10-06 ENCOUNTER — Other Ambulatory Visit: Payer: Self-pay | Admitting: Internal Medicine

## 2018-10-25 ENCOUNTER — Encounter: Payer: Self-pay | Admitting: Internal Medicine

## 2018-10-25 ENCOUNTER — Ambulatory Visit (INDEPENDENT_AMBULATORY_CARE_PROVIDER_SITE_OTHER): Payer: Medicare Other | Admitting: Internal Medicine

## 2018-10-25 ENCOUNTER — Other Ambulatory Visit: Payer: Self-pay

## 2018-10-25 ENCOUNTER — Other Ambulatory Visit: Payer: Self-pay | Admitting: Internal Medicine

## 2018-10-25 VITALS — BP 130/82 | HR 68 | Temp 98.3°F | Wt 186.9 lb

## 2018-10-25 DIAGNOSIS — E785 Hyperlipidemia, unspecified: Secondary | ICD-10-CM | POA: Diagnosis not present

## 2018-10-25 DIAGNOSIS — N183 Chronic kidney disease, stage 3 unspecified: Secondary | ICD-10-CM

## 2018-10-25 DIAGNOSIS — J42 Unspecified chronic bronchitis: Secondary | ICD-10-CM

## 2018-10-25 DIAGNOSIS — E1151 Type 2 diabetes mellitus with diabetic peripheral angiopathy without gangrene: Secondary | ICD-10-CM

## 2018-10-25 DIAGNOSIS — I10 Essential (primary) hypertension: Secondary | ICD-10-CM

## 2018-10-25 DIAGNOSIS — I482 Chronic atrial fibrillation, unspecified: Secondary | ICD-10-CM

## 2018-10-25 DIAGNOSIS — I480 Paroxysmal atrial fibrillation: Secondary | ICD-10-CM

## 2018-10-25 DIAGNOSIS — I5033 Acute on chronic diastolic (congestive) heart failure: Secondary | ICD-10-CM

## 2018-10-25 LAB — POCT GLYCOSYLATED HEMOGLOBIN (HGB A1C): Hemoglobin A1C: 7.6 % — AB (ref 4.0–5.6)

## 2018-10-25 MED ORDER — CLOPIDOGREL BISULFATE 75 MG PO TABS: 75.0000 mg | ORAL_TABLET | Freq: Every day | ORAL | 3 refills | Status: DC

## 2018-10-25 MED ORDER — NITROGLYCERIN 0.4 MG SL SUBL: 0.4000 mg | SUBLINGUAL_TABLET | SUBLINGUAL | 4 refills | Status: DC | PRN

## 2018-10-25 MED ORDER — METOPROLOL TARTRATE 100 MG PO TABS: 100.0000 mg | ORAL_TABLET | Freq: Two times a day (BID) | ORAL | 4 refills | Status: DC

## 2018-10-25 MED ORDER — LEVALBUTEROL TARTRATE 45 MCG/ACT IN AERO: 1.0000 | INHALATION_SPRAY | Freq: Four times a day (QID) | RESPIRATORY_TRACT | 5 refills | Status: DC | PRN

## 2018-10-25 MED ORDER — TRESIBA FLEXTOUCH 100 UNIT/ML ~~LOC~~ SOPN: 28.0000 [IU] | PEN_INJECTOR | Freq: Every day | SUBCUTANEOUS | 2 refills | Status: DC

## 2018-10-25 MED ORDER — DILTIAZEM HCL ER COATED BEADS 360 MG PO CP24: 360.0000 mg | ORAL_CAPSULE | Freq: Every day | ORAL | 4 refills | Status: DC

## 2018-10-25 MED ORDER — METFORMIN HCL 1000 MG PO TABS: ORAL_TABLET | ORAL | 4 refills | Status: DC

## 2018-10-25 MED ORDER — APIXABAN 5 MG PO TABS: 5.0000 mg | ORAL_TABLET | Freq: Two times a day (BID) | ORAL | 4 refills | Status: DC

## 2018-10-25 MED ORDER — FUROSEMIDE 80 MG PO TABS: 80.0000 mg | ORAL_TABLET | Freq: Every day | ORAL | 1 refills | Status: DC

## 2018-10-25 MED ORDER — ATORVASTATIN CALCIUM 40 MG PO TABS: 40.0000 mg | ORAL_TABLET | Freq: Every day | ORAL | 3 refills | Status: DC

## 2018-10-25 NOTE — Patient Instructions (Signed)
-  Nice seeing you today!!  -Great job on getting your A1c down! It is now 7.6. Let's try and get it below 7 in the next 3 months.  -Schedule follow up in 3 months.

## 2018-10-25 NOTE — Progress Notes (Signed)
Established Patient Office Visit     CC/Reason for Visit: 18-month follow-up chronic conditions  HPI: Jason Fisher is a 78 y.o. male who is coming in today for the above mentioned reasons. Past Medical History is significant for:  Chronic diastolic heart failure, benign essential hypertension, AAA, type 2 diabetes, chronic atrial fibrillation, chronic kidney disease stage III. He sees me mainly for diabetic care. We have been working on insulin titration and getting A1c down. There were some comprehension barriers initially. We had a HHRN reach out to him. He is doing better. Unfortunately, he does not bring his CBG log in today as requested. States CBGs are between 130-145 on average. No hypoglycemic events. He has improved his diet.  He is now on 28 units of Tresiba at bedtime.  He is requesting all medications be refilled today.  He is not interested in receiving his flu vaccine today.   Past Medical/Surgical History: Past Medical History:  Diagnosis Date  . 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 BENIGN PROSTATIC HYPERTROPHY, HX OF 02/24/2007  . CAD S/P PCI    a. 2007 Inf STEMI s/p Vision BMS  2 to RCA;  b. 2009 ISR Prox RCA Stent-->with DES;  c. 09/2015 PCI: LM nl, LAD 30p/m, D1 90, LCX nl, OM1 small, 80, OM2 90 (2.5x14 Biofreedom stent), OM3 small, RCA 30p ISR, 7m ISR, 81m, 85d, EF 50-55%.  . 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 Chronic atrial fibrillation 11/06/2009   a. On Eliquis (CHA2DS2VASc = 5).  Rate controlled w/ BB and CCB.  Marland Kitchen Chronic diastolic CHF (congestive heart failure) ([place])    a. 09/2015 Echo: EF 55-60%, mildly dil LA.  . CKD (chronic kidney disease), stage III ([place])    they mentioned this to the patient but they would work on it later after the aneurysm  . COLONIC POLYPS, HX OF 11/02/2006  . COPD (chronic obstructive pulmonary disease) ([place])   . DIABETES MELLITUS, TYPE II 10/29/2006  . Diverticulosis   . History of tobacco abuse    a. Quit 09/2015.  Marland Kitchen  HYPERLIPIDEMIA 10/29/2006  . Hypertensive heart disease with heart failure ([place])   . Pneumonia 09/2015   [name] 07/31/2016  . STEMI (ST elevation myocardial infarction) [facility]) 2007   [name] 07/31/2016    Past Surgical History:  Procedure Laterality Date  . ABDOMINAL AORTIC ENDOVASCULAR STENT GRAFT N/A 07/31/2016   Procedure: ABDOMINAL AORTIC ENDOVASCULAR STENT GRAFT;  Surgeon: Serafina Mitchell, MD;  Location: [place];  Service: Vascular;  Laterality: N/A;  . CARDIAC CATHETERIZATION N/A 09/26/2015   Procedure: Left Heart Cath and Coronary Angiography;  Surgeon: Peter M Martinique, MD;  Location: [place] [facility];  Service: Cardiovascular;  Laterality: N/A;  . CARDIAC CATHETERIZATION N/A 09/26/2015   Procedure: Coronary Stent Intervention;  Surgeon: Peter M Martinique, MD;  Location: [place] [facility];  Service: Cardiovascular;  Laterality: N/A;  . CATARACT EXTRACTION Bilateral   . COLONOSCOPY    . CORONARY ANGIOPLASTY WITH STENT PLACEMENT  2007   Inferior STEMI 2007 - RCA PCI with 2 Vision BMS; Abnormal Myoview in 2009 - pRCA ins-stent restenosis & unstented disease - DES PCI Promus 3.0 x 20 mm (3.5 mm)  . ENDOVASCULAR REPAIR/STENT GRAFT  07/31/2016  . INCISION AND DRAINAGE PERIRECTAL ABSCESS N/A 11/06/2015   Procedure: IRRIGATION AND DEBRIDEMENT PERIRECTAL ABSCESS, POSSIBLE HEMORRHOIDECTOMY;  Surgeon: Coralie Keens, MD;  Location: [place];  Service: General;  Laterality: N/A;  . ORIF SHOULDER FRACTURE Right    "  fell out of back end of a truck"    Social History:  reports that he quit smoking about 3 years ago. His smoking use included cigarettes. He has a 49.00 pack-year smoking history. He has never used smokeless tobacco. He reports that he does not drink alcohol or use drugs.  Allergies: Allergies  Allergen Reactions  . Contrast Media [Iodinated Diagnostic Agents] Anaphylaxis    Family History:  Family History  Problem Relation Age of Onset  . Heart attack Mother   . Diabetes Mother   .  Heart attack Father   . Hypertension Father   . Heart attack Brother   . Diabetes Brother      Current Outpatient Medications:  .  apixaban (ELIQUIS) 5 MG TABS tablet, Take 1 tablet (5 mg total) by mouth 2 (two) times daily., Disp: 60 tablet, Rfl: 4 .  atorvastatin (LIPITOR) 40 MG tablet, Take 1 tablet (40 mg total) by mouth daily., Disp: 90 tablet, Rfl: 3 .  clopidogrel (PLAVIX) 75 MG tablet, Take 1 tablet (75 mg total) by mouth daily., Disp: 90 tablet, Rfl: 3 .  diltiazem (CARDIZEM CD) 360 MG 24 hr capsule, Take 1 capsule (360 mg total) by mouth daily., Disp: 90 capsule, Rfl: 4 .  furosemide (LASIX) 80 MG tablet, Take 1 tablet (80 mg total) by mouth daily., Disp: 180 tablet, Rfl: 1 .  glucose blood (ACCU-CHEK AVIVA PLUS) test strip, CHECK BLOOD SUGAR ONCE DAILY.  Dx E11.51, Disp: 100 each, Rfl: 2 .  Glycopyrrolate-Formoterol (BEVESPI AEROSPHERE) 9-4.8 MCG/ACT AERO, Inhale 2 puffs into the lungs 2 (two) times daily., Disp: 1 Inhaler, Rfl: 5 .  levalbuterol (XOPENEX HFA) 45 MCG/ACT inhaler, Inhale 1-2 puffs into the lungs every 6 (six) hours as needed for wheezing., Disp: 1 Inhaler, Rfl: 5 .  metFORMIN (GLUCOPHAGE) 1000 MG tablet, TAKE 1 TABLET TWICE DAILY WITH A MEAL, Disp: 180 tablet, Rfl: 4 .  metoprolol tartrate (LOPRESSOR) 100 MG tablet, Take 1 tablet (100 mg total) by mouth 2 (two) times daily., Disp: 180 tablet, Rfl: 4 .  nitroGLYCERIN (NITROSTAT) 0.4 MG SL tablet, Place 1 tablet (0.4 mg total) under the tongue every 5 (five) minutes as needed for chest pain. X 3 doses, Disp: 25 tablet, Rfl: 4 .  polyethylene glycol (MIRALAX / GLYCOLAX) packet, Take 17 g by mouth daily as needed for mild constipation., Disp: , Rfl:  .  TRESIBA FLEXTOUCH 100 UNIT/ML SOPN FlexTouch Pen, INJECT 16 UNITS INTO THE SKIN DAILY, Disp: 15 pen, Rfl: 2 .  hydrALAZINE (APRESOLINE) 25 MG tablet, Take 1 tablet (25 mg total) by mouth 3 (three) times daily., Disp: 270 tablet, Rfl: 3  Review of Systems:   Constitutional: Denies fever, chills, diaphoresis, appetite change and fatigue.  HEENT: Denies photophobia, eye pain, redness, hearing loss, ear pain, congestion, sore throat, rhinorrhea, sneezing, mouth sores, trouble swallowing, neck pain, neck stiffness and tinnitus.   Respiratory: Denies SOB, DOE, cough, chest tightness,  and wheezing.   Cardiovascular: Denies chest pain, palpitations and leg swelling.  Gastrointestinal: Denies nausea, vomiting, abdominal pain, diarrhea, constipation, blood in stool and abdominal distention.  Genitourinary: Denies dysuria, urgency, frequency, hematuria, flank pain and difficulty urinating.  Endocrine: Denies: hot or cold intolerance, sweats, changes in hair or nails, polyuria, polydipsia. Musculoskeletal: Denies myalgias, back pain, joint swelling, arthralgias and gait problem.  Skin: Denies pallor, rash and wound.  Neurological: Denies dizziness, seizures, syncope, weakness, light-headedness, numbness and headaches.  Hematological: Denies adenopathy. Easy bruising, personal or family bleeding history  Psychiatric/Behavioral:  Denies suicidal ideation, mood changes, confusion, nervousness, sleep disturbance and agitation    Physical Exam: Vitals:   10/25/18 1003  BP: 130/82  Pulse: 68  Temp: 98.3 F (36.8 C)  TempSrc: Temporal  SpO2: 97%  Weight: 186 lb 14.4 oz (84.8 kg)    Body mass index is 26.82 kg/m.   Constitutional: NAD, calm, comfortable Eyes: PERRL, lids and conjunctivae normal, wears corrective lenses ENMT: Mucous membranes are moist.  Respiratory: clear to auscultation bilaterally, no wheezing, no crackles. Normal respiratory effort. No accessory muscle use.  Cardiovascular: Regular rate and rhythm, no murmurs / rubs / gallops. No extremity edema. 2+ pedal pulses.  Abdomen: no tenderness, no masses palpated. No hepatosplenomegaly. Bowel sounds positive.  Musculoskeletal: no clubbing / cyanosis. No joint deformity upper and lower  extremities. Good ROM, no contractures. Normal muscle tone.  Skin: no rashes, lesions, ulcers. No induration Neurologic: Grossly intact and nonfocal Psychiatric: Normal judgment and insight. Alert and oriented x 3. Normal mood.    Impression and Plan:  DM (diabetes mellitus), type 2 with peripheral vascular complications ([place])  -Continues to improve with an A1c down to 7.6 from 8.2 in June and 10.8 in March. -He has a titration schedule for [place] which he continues to follow. -He has also improved his diet.  Dyslipidemia -Last LDL was 94 in November 2019, not at goal -On statin. -Check lipids with next CPE.  Essential hypertension -Well-controlled on current regimen.  Chronic atrial fibrillation ([place]):  -Continue Eliquis, rate controlled.  CKD (chronic kidney disease), stage III (HCC) -Baseline creatinine is around 1.3-1.4.    Patient Instructions  -Nice seeing you today!!  -Great job on getting your A1c down! It is now 7.6. Let's try and get it below 7 in the next 3 months.  -Schedule follow up in 3 months.     Lelon Frohlich, MD [facility] at [facility]

## 2018-10-25 NOTE — Telephone Encounter (Signed)
Okay to refill. Last refilled one year ago by Dr. Raliegh Ip

## 2019-01-24 ENCOUNTER — Ambulatory Visit (INDEPENDENT_AMBULATORY_CARE_PROVIDER_SITE_OTHER): Payer: Medicare Other | Admitting: Internal Medicine

## 2019-01-24 ENCOUNTER — Encounter: Payer: Self-pay | Admitting: Internal Medicine

## 2019-01-24 ENCOUNTER — Other Ambulatory Visit: Payer: Self-pay

## 2019-01-24 VITALS — BP 130/80 | HR 61 | Temp 97.9°F | Wt 183.3 lb

## 2019-01-24 DIAGNOSIS — E785 Hyperlipidemia, unspecified: Secondary | ICD-10-CM

## 2019-01-24 DIAGNOSIS — N1832 Chronic kidney disease, stage 3b: Secondary | ICD-10-CM

## 2019-01-24 DIAGNOSIS — I5033 Acute on chronic diastolic (congestive) heart failure: Secondary | ICD-10-CM

## 2019-01-24 DIAGNOSIS — E1151 Type 2 diabetes mellitus with diabetic peripheral angiopathy without gangrene: Secondary | ICD-10-CM

## 2019-01-24 DIAGNOSIS — I25708 Atherosclerosis of coronary artery bypass graft(s), unspecified, with other forms of angina pectoris: Secondary | ICD-10-CM | POA: Diagnosis not present

## 2019-01-24 DIAGNOSIS — I10 Essential (primary) hypertension: Secondary | ICD-10-CM

## 2019-01-24 DIAGNOSIS — I480 Paroxysmal atrial fibrillation: Secondary | ICD-10-CM

## 2019-01-24 LAB — POCT GLYCOSYLATED HEMOGLOBIN (HGB A1C): Hemoglobin A1C: 7.1 % — AB (ref 4.0–5.6)

## 2019-01-24 NOTE — Patient Instructions (Signed)
-  Nice seeing you today!!  -Good job with your diabetes.  -See you back in 3 months.

## 2019-01-24 NOTE — Progress Notes (Signed)
Established Patient Office Visit     This visit occurred during the SARS-CoV-2 public health emergency.  Safety protocols were in place, including screening questions prior to the visit, additional usage of staff PPE, and extensive cleaning of exam room while observing appropriate contact time as indicated for disinfecting solutions.    CC/Reason for Visit: 76-month follow-up chronic medical conditions  HPI: Jason Fisher is a 78 y.o. male who is coming in today for the above mentioned reasons. Past Medical History is significant [id number] diastolic heart failure, benign essential hypertension,AAA,type 2 diabetes, chronic atrial fibrillation, chronic kidney disease stage III.He sees me mainly for diabetic care. We have been working on insulin titration and getting A1c down. There were some comprehension barriers initially. We had a HHRN reach out to him. He is doing better. Unfortunately, he does not bring his CBG log in today as requested. States CBGs are between 130-145 on average. No hypoglycemic events. He has improved his diet.  He is now on 28 units of Tresiba at bedtime.  He has no complaints.   Past Medical/Surgical History: Past Medical History:  Diagnosis Date  . 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 BENIGN PROSTATIC HYPERTROPHY, HX OF 02/24/2007  . CAD S/P PCI    a. 2007 Inf STEMI s/p Vision BMS  2 to RCA;  b. 2009 ISR Prox RCA Stent-->with DES;  c. 09/2015 PCI: LM nl, LAD 30p/m, D1 90, LCX nl, OM1 small, 80, OM2 90 (2.5x14 Biofreedom stent), OM3 small, RCA 30p ISR, 67m ISR, 40m, 85d, EF 50-55%.  . 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 Chronic atrial fibrillation ([place]) 11/06/2009   a. On Eliquis (CHA2DS2VASc = 5).  Rate controlled w/ BB and CCB.  Marland Kitchen Chronic diastolic CHF (congestive heart failure) ([place])    a. 09/2015 Echo: EF 55-60%, mildly dil LA.  . CKD (chronic kidney disease), stage III    they mentioned this to the patient but they would work on it later after  the aneurysm  . COLONIC POLYPS, HX OF 11/02/2006  . COPD (chronic obstructive pulmonary disease) ([place])   . DIABETES MELLITUS, TYPE II 10/29/2006  . Diverticulosis   . History of tobacco abuse    a. Quit 09/2015.  Marland Kitchen HYPERLIPIDEMIA 10/29/2006  . Hypertensive heart disease with heart failure ([place])   . Pneumonia 09/2015   [name] 07/31/2016  . [facility]I (ST [facility] myocardial infarction) [facility]) 2007   [name] 07/31/2016    Past Surgical History:  Procedure Laterality Date  . ABDOMINAL AORTIC ENDOVASCULAR STENT GRAFT N/A 07/31/2016   Procedure: ABDOMINAL AORTIC ENDOVASCULAR STENT GRAFT;  Surgeon: Serafina Mitchell, MD;  Location: [place];  Service: Vascular;  Laterality: N/A;  . CARDIAC CATHETERIZATION N/A 09/26/2015   Procedure: Left Heart Cath and Coronary Angiography;  Surgeon: Peter M Martinique, MD;  Location: [place] [facility];  Service: Cardiovascular;  Laterality: N/A;  . CARDIAC CATHETERIZATION N/A 09/26/2015   Procedure: Coronary Stent Intervention;  Surgeon: Peter M Martinique, MD;  Location: [place] [facility];  Service: Cardiovascular;  Laterality: N/A;  . CATARACT EXTRACTION Bilateral   . COLONOSCOPY    . CORONARY ANGIOPLASTY WITH STENT PLACEMENT  2007   Inferior STEMI 2007 - RCA PCI with 2 Vision BMS; Abnormal Myoview in 2009 - pRCA ins-stent restenosis & unstented disease - DES PCI Promus 3.0 x 20 mm (3.5 mm)  . ENDOVASCULAR REPAIR/STENT GRAFT  07/31/2016  . INCISION AND DRAINAGE PERIRECTAL ABSCESS N/A 11/06/2015   Procedure: IRRIGATION AND  DEBRIDEMENT PERIRECTAL ABSCESS, POSSIBLE HEMORRHOIDECTOMY;  Surgeon: Coralie Keens, MD;  Location: [place];  Service: General;  Laterality: N/A;  . ORIF SHOULDER FRACTURE Right    "fell out of back end of a truck"    Social History:  reports that he quit smoking about 3 years ago. His smoking use included cigarettes. He has a 49.00 pack-year smoking history. He has never used smokeless tobacco. He reports that he does not drink alcohol or use  drugs.  Allergies: Allergies  Allergen Reactions  . Contrast Media [Iodinated Diagnostic Agents] Anaphylaxis    Family History:  Family History  Problem Relation Age of Onset  . Heart attack Mother   . Diabetes Mother   . Heart attack Father   . Hypertension Father   . Heart attack Brother   . Diabetes Brother      Current Outpatient Medications:  .  apixaban (ELIQUIS) 5 MG TABS tablet, Take 1 tablet (5 mg total) by mouth 2 (two) times daily., Disp: 60 tablet, Rfl: 4 .  atorvastatin (LIPITOR) 40 MG tablet, Take 1 tablet (40 mg total) by mouth daily., Disp: 90 tablet, Rfl: 3 .  clopidogrel (PLAVIX) 75 MG tablet, Take 1 tablet (75 mg total) by mouth daily., Disp: 90 tablet, Rfl: 3 .  diltiazem (CARDIZEM CD) 360 MG 24 hr capsule, Take 1 capsule (360 mg total) by mouth daily., Disp: 90 capsule, Rfl: 4 .  furosemide (LASIX) 80 MG tablet, Take 1 tablet (80 mg total) by mouth daily., Disp: 180 tablet, Rfl: 1 .  glucose blood (ACCU-CHEK AVIVA PLUS) test strip, CHECK BLOOD SUGAR ONCE DAILY.  Dx E11.51, Disp: 100 each, Rfl: 2 .  Glycopyrrolate-Formoterol (BEVESPI AEROSPHERE) 9-4.8 MCG/ACT AERO, Inhale 2 puffs into the lungs 2 (two) times daily., Disp: 1 Inhaler, Rfl: 5 .  hydrALAZINE (APRESOLINE) 25 MG tablet, Take 1 tablet (25 mg total) by mouth 3 (three) times daily., Disp: 270 tablet, Rfl: 1 .  insulin degludec (TRESIBA FLEXTOUCH) 100 UNIT/ML SOPN FlexTouch Pen, Inject 0.28 mLs (28 Units total) into the skin at bedtime., Disp: 15 pen, Rfl: 2 .  levalbuterol (XOPENEX HFA) 45 MCG/ACT inhaler, Inhale 1-2 puffs into the lungs every 6 (six) hours as needed for wheezing., Disp: 1 Inhaler, Rfl: 5 .  metFORMIN (GLUCOPHAGE) 1000 MG tablet, TAKE 1 TABLET TWICE DAILY WITH A MEAL, Disp: 180 tablet, Rfl: 4 .  metoprolol tartrate (LOPRESSOR) 100 MG tablet, Take 1 tablet (100 mg total) by mouth 2 (two) times daily., Disp: 180 tablet, Rfl: 4 .  nitroGLYCERIN (NITROSTAT) 0.4 MG SL tablet, Place 1 tablet  (0.4 mg total) under the tongue every 5 (five) minutes as needed for chest pain. X 3 doses, Disp: 25 tablet, Rfl: 4 .  polyethylene glycol (MIRALAX / GLYCOLAX) packet, Take 17 g by mouth daily as needed for mild constipation., Disp: , Rfl:   Review of Systems:  Constitutional: Denies fever, chills, diaphoresis, appetite change and fatigue.  HEENT: Denies photophobia, eye pain, redness, hearing loss, ear pain, congestion, sore throat, rhinorrhea, sneezing, mouth sores, trouble swallowing, neck pain, neck stiffness and tinnitus.   Respiratory: Denies SOB, DOE, cough, chest tightness,  and wheezing.   Cardiovascular: Denies chest pain, palpitations and leg swelling.  Gastrointestinal: Denies nausea, vomiting, abdominal pain, diarrhea, constipation, blood in stool and abdominal distention.  Genitourinary: Denies dysuria, urgency, frequency, hematuria, flank pain and difficulty urinating.  Endocrine: Denies: hot or cold intolerance, sweats, changes in hair or nails, polyuria, polydipsia. Musculoskeletal: Denies myalgias, back pain, joint swelling,  arthralgias and gait problem.  Skin: Denies pallor, rash and wound.  Neurological: Denies dizziness, seizures, syncope, weakness, light-headedness, numbness and headaches.  Hematological: Denies adenopathy. Easy bruising, personal or family bleeding history  Psychiatric/Behavioral: Denies suicidal ideation, mood changes, confusion, nervousness, sleep disturbance and agitation    Physical Exam: Vitals:   01/24/19 0909  BP: 130/80  Pulse: 61  Temp: 97.9 F (36.6 C)  TempSrc: Temporal  SpO2: 96%  Weight: 183 lb 4.8 oz (83.1 kg)    Body mass index is 26.3 kg/m.   Constitutional: NAD, calm, comfortable Eyes: PERRL, lids and conjunctivae normal, wears corrective lenses ENMT: Mucous membranes are moist.  Respiratory: clear to auscultation bilaterally, no wheezing, no crackles. Normal respiratory effort. No accessory muscle use.  Cardiovascular:  Regular rate and rhythm, no murmurs / rubs / gallops. No extremity edema. 2+ pedal pulses.  Abdomen: no tenderness, no masses palpated. No hepatosplenomegaly. Bowel sounds positive.  Musculoskeletal: no clubbing / cyanosis. No joint deformity upper and lower extremities. Good ROM, no contractures. Normal muscle tone.  Skin: no rashes, lesions, ulcers. No induration Neurologic: Grossly intact and nonfocal Psychiatric: Normal judgment and insight. Alert and oriented x 3. Normal mood.    Impression and Plan:  DM (diabetes mellitus), type 2 with peripheral vascular complications ([place])  -[id number] continues to show improvement, is 7.1 today down from 7.6 in September and 10.8 in March. -He continues on 28 units of Tresiba.  Coronary artery disease of bypass graft of native heart with stable angina pectoris (HCC) -Stable, no chest pain, follows routinely with cardiology.   chronic diastolic congestive heart failure ([place]) -Compensated  Stage 3b chronic kidney disease -Baseline creatinine is around 1.3-1.4  Dyslipidemia -Last LDL was 94 in November 2019, he is on Lipitor.  Essential hypertension -Well-controlled on current regimen.  Paroxysmal atrial fibrillation (HCC) -Rate controlled on metoprolol and diltiazem and anticoagulated on Eliquis.    Patient Instructions  -Nice seeing you today!!  -Good job with your diabetes.  -See you back in 3 months.     Lelon Frohlich, MD [facility] [facility] at [facility]

## 2019-02-09 ENCOUNTER — Other Ambulatory Visit: Payer: Self-pay | Admitting: *Deleted

## 2019-02-09 DIAGNOSIS — E1151 Type 2 diabetes mellitus with diabetic peripheral angiopathy without gangrene: Secondary | ICD-10-CM

## 2019-02-09 MED ORDER — TRESIBA FLEXTOUCH 100 UNIT/ML ~~LOC~~ SOPN: 28.0000 [IU] | PEN_INJECTOR | Freq: Every day | SUBCUTANEOUS | 2 refills | Status: DC

## 2019-03-01 NOTE — Progress Notes (Deleted)
Cardiology Office Note    Date:  03/01/2019   ID:  [name], DOB 08/01/1940, MRN [id number]  PCP:  Jason Fisher, Jason Halsted, MD  Cardiologist:  Dr. Martinique   No chief complaint on file.   History of Present Illness:  Jason Fisher is a 79 y.o. male seen for follow up CAD and Afib.  He has a PMH of CAD (s/p BMS to RCA 2007, ISR with DES to RCA in 2009, DES to OM2 in 09/2015),chronic atrial fibrillation on Eliquis, chronic diastolic heart failure,HTN, HLD, AAA, COPD,and tobacco use. He was previously admitted in March 2018 for atrial fibrillation with RVR. Troponin was borderline elevated. He was initially treated with IV Cardizem and later switched to p.o. Cardizem prior to discharge. He also underwent AAA endovascular stent grafting by Dr. Trula Fisher in June 2018. He was seen in October 2019 by Jason Deforest PA-C for increased edema.  Lasix was increased to 80 mg twice daily for 3 days before decreasing back down to 40 mg twice daily thereafter.  Echocardiogram on 11/05/2017 this showed EF 60 to 65%, mildly increased aortic root measuring 40 mm, moderately dilated left and the right atrium.    On follow up today he is doing well. He is not taking metolazone anymore. Only taking lasix 80 mg once a day. No longer taking potassium supplement. Swelling is down. Weight is down 3 lbs. No chest pain. Still has some dyspnea. Tries to walk daily.     Past Medical History:  Diagnosis Date  . 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 BENIGN PROSTATIC HYPERTROPHY, HX OF 02/24/2007  . CAD S/P PCI    a. 2007 Inf STEMI s/p Vision BMS  2 to RCA;  b. 2009 ISR Prox RCA Stent-->with DES;  c. 09/2015 PCI: LM nl, LAD 30p/m, D1 90, LCX nl, OM1 small, 80, OM2 90 (2.5x14 Biofreedom stent), OM3 small, RCA 30p ISR, 56m ISR, 25m, 85d, EF 50-55%.  . 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 Chronic atrial fibrillation ([place]) 11/06/2009   a. On Eliquis (CHA2DS2VASc = 5).  Rate controlled w/ BB and CCB.  Marland Kitchen Chronic  diastolic CHF (congestive heart failure) ([place])    a. 09/2015 Echo: EF 55-60%, mildly dil LA.  . CKD (chronic kidney disease), stage III    they mentioned this to the patient but they would work on it later after the aneurysm  . COLONIC POLYPS, HX OF 11/02/2006  . COPD (chronic obstructive pulmonary disease) ([place])   . DIABETES MELLITUS, TYPE II 10/29/2006  . Diverticulosis   . History of tobacco abuse    a. Quit 09/2015.  Marland Kitchen HYPERLIPIDEMIA 10/29/2006  . Hypertensive heart disease with heart failure ([place])   . Pneumonia 09/2015   Jason Fisher 07/31/2016  . STEMI (ST elevation myocardial infarction) [facility]) 2007   Jason Fisher 07/31/2016    Past Surgical History:  Procedure Laterality Date  . ABDOMINAL AORTIC ENDOVASCULAR STENT GRAFT N/A 07/31/2016   Procedure: ABDOMINAL AORTIC ENDOVASCULAR STENT GRAFT;  Surgeon: Jason Mitchell, MD;  Location: [facility];  Service: Vascular;  Laterality: N/A;  . CARDIAC CATHETERIZATION N/A 09/26/2015   Procedure: Left Heart Cath and Coronary Angiography;  Surgeon: Jason Fuerst M Martinique, MD;  Location: [place] [facility];  Service: Cardiovascular;  Laterality: N/A;  . CARDIAC CATHETERIZATION N/A 09/26/2015   Procedure: Coronary Stent Intervention;  Surgeon: Akeya Ryther M Martinique, MD;  Location: [place] [facility];  Service: Cardiovascular;  Laterality: N/A;  . CATARACT EXTRACTION Bilateral   .  COLONOSCOPY    . CORONARY ANGIOPLASTY WITH STENT PLACEMENT  2007   Inferior STEMI 2007 - RCA PCI with 2 Vision BMS; Abnormal Myoview in 2009 - pRCA ins-stent restenosis & unstented disease - DES PCI Promus 3.0 x 20 mm (3.5 mm)  . ENDOVASCULAR REPAIR/STENT GRAFT  07/31/2016  . INCISION AND DRAINAGE PERIRECTAL ABSCESS N/A 11/06/2015   Procedure: IRRIGATION AND DEBRIDEMENT PERIRECTAL ABSCESS, POSSIBLE HEMORRHOIDECTOMY;  Surgeon: Jason Keens, MD;  Location: [place];  Service: General;  Laterality: N/A;  . ORIF SHOULDER FRACTURE Right    "fell out of back end of a truck"    Current Medications: Outpatient  Medications Prior to Visit  Medication Sig Dispense Refill  . apixaban (ELIQUIS) 5 MG TABS tablet Take 1 tablet (5 mg total) by mouth 2 (two) times daily. 60 tablet 4  . atorvastatin (LIPITOR) 40 MG tablet Take 1 tablet (40 mg total) by mouth daily. 90 tablet 3  . clopidogrel (PLAVIX) 75 MG tablet Take 1 tablet (75 mg total) by mouth daily. 90 tablet 3  . diltiazem (CARDIZEM CD) 360 MG 24 hr capsule Take 1 capsule (360 mg total) by mouth daily. 90 capsule 4  . furosemide (LASIX) 80 MG tablet Take 1 tablet (80 mg total) by mouth daily. 180 tablet 1  . glucose blood (ACCU-CHEK AVIVA PLUS) test strip CHECK BLOOD SUGAR ONCE DAILY.  Dx E11.51 100 each 2  . Glycopyrrolate-Formoterol (BEVESPI AEROSPHERE) 9-4.8 MCG/ACT AERO Inhale 2 puffs into the lungs 2 (two) times daily. 1 Inhaler 5  . hydrALAZINE (APRESOLINE) 25 MG tablet Take 1 tablet (25 mg total) by mouth 3 (three) times daily. 270 tablet 1  . insulin degludec (TRESIBA FLEXTOUCH) 100 UNIT/ML SOPN FlexTouch Pen Inject 0.28 mLs (28 Units total) into the skin at bedtime. 15 pen 2  . levalbuterol (XOPENEX HFA) 45 MCG/ACT inhaler Inhale 1-2 puffs into the lungs every 6 (six) hours as needed for wheezing. 1 Inhaler 5  . metFORMIN (GLUCOPHAGE) 1000 MG tablet TAKE 1 TABLET TWICE DAILY WITH A MEAL 180 tablet 4  . metoprolol tartrate (LOPRESSOR) 100 MG tablet Take 1 tablet (100 mg total) by mouth 2 (two) times daily. 180 tablet 4  . nitroGLYCERIN (NITROSTAT) 0.4 MG SL tablet Place 1 tablet (0.4 mg total) under the tongue every 5 (five) minutes as needed for chest pain. X 3 doses 25 tablet 4  . polyethylene glycol (MIRALAX / GLYCOLAX) packet Take 17 g by mouth daily as needed for mild constipation.     No facility-administered medications prior to visit.     Allergies:   Contrast media [iodinated diagnostic agents]   Social History   Socioeconomic History  . Marital status: Married    Spouse name: Not on file  . Number of children: 5  . Years of  education: Not on file  . Highest education level: Not on file  Occupational History  . Occupation: [employer]  Tobacco Use  . Smoking status: Former Smoker    Packs/day: 1.00    Years: 49.00    Pack years: 49.00    Types: Cigarettes    Quit date: 09/08/2015    Years since quitting: 3.4  . Smokeless tobacco: Never Used  . Tobacco comment: passive smoker as well  Substance and Sexual Activity  . Alcohol use: No    Alcohol/week: 0.0 standard drinks  . Drug use: No  . Sexual activity: Not on file  Other Topics Concern  . Not on file  Social History Narrative  .  Not on file   Social Determinants of Health   Financial Resource Strain:   . Difficulty of Paying Living Expenses: Not on file  Food Insecurity:   . Worried About Charity fundraiser in the Last Year: Not on file  . Ran Out of Food in the Last Year: Not on file  Transportation Needs:   . Lack of Transportation (Medical): Not on file  . Lack of Transportation (Non-Medical): Not on file  Physical Activity:   . Days of Exercise per Week: Not on file  . Minutes of Exercise per Session: Not on file  Stress:   . Feeling of Stress : Not on file  Social Connections:   . Frequency of Communication with Friends and Family: Not on file  . Frequency of Social Gatherings with Friends and Family: Not on file  . Attends Religious Services: Not on file  . Active Member of Clubs or Organizations: Not on file  . Attends Archivist Meetings: Not on file  . Marital Status: Not on file     Family History:  The patient's family history includes Diabetes in his brother and mother; Heart attack in his brother, father, and mother; Hypertension in his father.   ROS:   Please see the history of present illness.    ROS All other systems reviewed and are negative.   PHYSICAL EXAM:   VS:  There were no vitals taken for this visit.   GENERAL:  Chronically ill appearing WM in NAD HEENT:  PERRL, EOMI, sclera are clear.  Oropharynx is clear. NECK:  No jugular venous distention, carotid upstroke brisk and symmetric, no bruits, no thyromegaly or adenopathy LUNGS:  Clear to auscultation bilaterally CHEST:  Unremarkable HEART:  IRRR,  PMI not displaced or sustained,S1 and S2 within normal limits, no S3, no S4: no clicks, no rubs, no murmurs ABD:  Soft, nontender. BS +, no masses or bruits. No hepatomegaly, no splenomegaly EXT:  2 + pulses throughout, no edema, no cyanosis no clubbing SKIN:  Warm and dry.  No rashes NEURO:  Alert and oriented x 3. Cranial nerves II through XII intact. PSYCH:  Cognitively intact    Wt Readings from Last 3 Encounters:  01/24/19 183 lb 4.8 oz (83.1 kg)  10/25/18 186 lb 14.4 oz (84.8 kg)  09/05/18 187 lb (84.8 kg)      Studies/Labs Reviewed:   EKG:  EKG is not ordered today.    Recent Labs: No results found for requested labs within last 8760 hours.   Lipid Panel    Component Value Date/Time   CHOL 161 12/03/2017 1210   TRIG 120 12/03/2017 1210   HDL 43 12/03/2017 1210   CHOLHDL 3.7 12/03/2017 1210   CHOLHDL 2.9 09/25/2015 0221   VLDL 13 09/25/2015 0221   LDLCALC 94 12/03/2017 1210    Additional studies/ records that were reviewed today include:   Cath 09/26/2015  Prox LAD to Mid LAD lesion, 30 %stenosed.  1st Diag lesion, 90 %stenosed.  Ost 1st Mrg to 1st Mrg lesion, 80 %stenosed.  Ost RCA to Prox RCA lesion, 30 %stenosed.  Mid RCA-2 lesion, 40 %stenosed.  Mid RCA-1 lesion, 15 %stenosed.  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  There is mild left ventricular systolic dysfunction.  The left ventricular ejection fraction is 50-55% by visual estimate.  LV end diastolic pressure is moderately elevated.  A drug eluting stent was successfully placed.  2nd Mrg lesion, 90 %  stenosed.  Post intervention, there is a 0% residual stenosis.  1. 3 vessel obstructive CAD.  - chronic severe stenosis in the first  diagonal - New 90% stenosis in a large second OM - Stents in the proximal and mid RCA are patent - Aneurysm and stenosis in the PLOM branch of the RCA- the aneurysm is new compared to 2009 2. Mild LV dysfunction 3. Elevated LVEDP 4. Allergic response to contrast with hives and bronchospasm. 5. Successful stenting of the second OM with a Biofreedom stent.   Plan: continue IV diuresis. Patient received steroids, Benadryl, and IV pepcid for contrast reaction. Continue DAPT for one month then DC ASA. Resume Eliquis in am.     Echo 11/05/2017 LV EF: 60% - 65% Study Conclusions  - Left ventricle: The cavity size was normal. There was moderate concentric hypertrophy. Systolic function was normal. The estimated ejection fraction was in the range of 60% to 65%. Wall motion was normal; there were no regional wall motion abnormalities. The study was not technically sufficient to allow evaluation of LV diastolic dysfunction due to atrial fibrillation. - Aortic valve: Poorly visualized. Trileaflet; moderately thickened, moderately calcified leaflets. - Aorta: Aortic root dimension: 40 mm (ED). Ascending aortic diameter: 38 mm (S). - Aortic root: The aortic root was mildly dilated. - Ascending aorta: The ascending aorta was mildly dilated. - Left atrium: The atrium was moderately dilated. - Right atrium: The atrium was moderately dilated. - Pulmonic valve: There was mild regurgitation. - Pulmonary arteries: Systolic pressure could not be accurately estimated.     ASSESSMENT:    No diagnosis found.   PLAN:  In order of problems listed above:  1. Chronic diastolic heart failure: Appears to be  euvolemic on physical exam.  Edema is improved and weight is down. Will continue current diuretic therapy and sodium restriction.   2. CAD: Denies any chest pain.  Not on aspirin given the need for Eliquis  3. Chronic atrial fibrillation: On Eliquis, rate  controlled with metoprolol  4. Hyperlipidemia: On Lipitor 20 mg daily.  LDL 90. Goal < 70. Will increase to 40 mg daily.   5. History of AAA repair: Followed by Dr. Trula Fisher    Medication Adjustments/Labs and Tests Ordered: Current medicines are reviewed at length with the patient today.  Concerns regarding medicines are outlined above.  Medication changes, Labs and Tests ordered today are listed in the Patient Instructions below. There are no Patient Instructions on file for this visit.   Signed, Jocie Meroney Martinique, MD  03/01/2019 11:32 AM    [facility] [address], [place], [place]  [address] Phone: [phone]; Fax: [phone]

## 2019-03-03 NOTE — Progress Notes (Signed)
Cardiology Office Note    Date:  03/06/2019   ID:  Jason Fisher, DOB 20-May-1940, MRN [id number]  PCP:  [name], [name], MD  Cardiologist:  Dr. Martinique   Chief Complaint  Patient presents with  . Coronary Artery Disease    History of Present Illness:  Jason Fisher is a 79 y.o. male seen for follow up CAD and Afib.  He has a PMH of CAD (s/p BMS to RCA 2007, ISR with DES to RCA in 2009, DES to OM2 in 09/2015),chronic atrial fibrillation on Eliquis, chronic diastolic heart failure,HTN, HLD, AAA, COPD,and tobacco use. He was previously admitted in March 2018 for atrial fibrillation with RVR. Troponin was borderline elevated. He was initially treated with IV Cardizem and later switched to p.o. Cardizem prior to discharge. He also underwent AAA endovascular stent grafting by Dr. Trula Slade in June 2018. He was seen in October 2019 by [name] PA-C for increased edema.  Lasix was increased to 80 mg twice daily for 3 days before decreasing back down to 40 mg twice daily thereafter.  Echocardiogram on 11/05/2017 this showed EF 60 to 65%, mildly increased aortic root measuring 40 mm, moderately dilated left and the right atrium.    On follow up today he is doing well. He denies any chest pain, dyspnea, palpitations, dizziness or edema. Weight is stable. Reports sugars are doing good. He states he is smoking very little and is almost quit.   Past Medical History:  Diagnosis Date  . 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 BENIGN PROSTATIC HYPERTROPHY, HX OF 02/24/2007  . CAD S/P PCI    a. 2007 Inf STEMI s/p Vision BMS  2 to RCA;  b. 2009 ISR Prox RCA Stent-->with DES;  c. 09/2015 PCI: LM nl, LAD 30p/m, D1 90, LCX nl, OM1 small, 80, OM2 90 (2.5x14 Biofreedom stent), OM3 small, RCA 30p ISR, 48m ISR, 14m, 85d, EF 50-55%.  . 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 Chronic atrial fibrillation ([place]) 11/06/2009   a. On Eliquis (CHA2DS2VASc = 5).  Rate controlled w/ BB and CCB.  Marland Kitchen Chronic  diastolic CHF (congestive heart failure) ([place])    a. 09/2015 Echo: EF 55-60%, mildly dil LA.  . CKD (chronic kidney disease), stage III    they mentioned this to the patient but they would work on it later after the aneurysm  . COLONIC POLYPS, HX OF 11/02/2006  . COPD (chronic obstructive pulmonary disease) ([place])   . DIABETES MELLITUS, TYPE II 10/29/2006  . Diverticulosis   . History of tobacco abuse    a. Quit 09/2015.  Marland Kitchen HYPERLIPIDEMIA 10/29/2006  . Hypertensive heart disease with heart failure ([place])   . Pneumonia 09/2015   [name] 07/31/2016  . STEMI (ST elevation myocardial infarction) [facility]) 2007   [name] 07/31/2016    Past Surgical History:  Procedure Laterality Date  . ABDOMINAL AORTIC ENDOVASCULAR STENT GRAFT N/A 07/31/2016   Procedure: ABDOMINAL AORTIC ENDOVASCULAR STENT GRAFT;  Surgeon: Serafina Mitchell, MD;  Location: [facility];  Service: Vascular;  Laterality: N/A;  . CARDIAC CATHETERIZATION N/A 09/26/2015   Procedure: Left Heart Cath and Coronary Angiography;  Surgeon: Ciin Brazzel M Martinique, MD;  Location: [facility];  Service: Cardiovascular;  Laterality: N/A;  . CARDIAC CATHETERIZATION N/A 09/26/2015   Procedure: Coronary Stent Intervention;  Surgeon: Alyannah Sanks M Martinique, MD;  Location: [facility];  Service: Cardiovascular;  Laterality: N/A;  . CATARACT EXTRACTION Bilateral   . COLONOSCOPY    .  CORONARY ANGIOPLASTY WITH STENT PLACEMENT  2007   Inferior STEMI 2007 - RCA PCI with 2 Vision BMS; Abnormal Myoview in 2009 - pRCA ins-stent restenosis & unstented disease - DES PCI Promus 3.0 x 20 mm (3.5 mm)  . ENDOVASCULAR REPAIR/STENT GRAFT  07/31/2016  . INCISION AND DRAINAGE PERIRECTAL ABSCESS N/A 11/06/2015   Procedure: IRRIGATION AND DEBRIDEMENT PERIRECTAL ABSCESS, POSSIBLE HEMORRHOIDECTOMY;  Surgeon: Coralie Keens, MD;  Location: [facility];  Service: General;  Laterality: N/A;  . ORIF SHOULDER FRACTURE Right    "fell out of back end of a truck"    Current Medications: Outpatient  Medications Prior to Visit  Medication Sig Dispense Refill  . apixaban (ELIQUIS) 5 MG TABS tablet Take 1 tablet (5 mg total) by mouth 2 (two) times daily. 60 tablet 4  . atorvastatin (LIPITOR) 40 MG tablet Take 1 tablet (40 mg total) by mouth daily. 90 tablet 3  . clopidogrel (PLAVIX) 75 MG tablet Take 1 tablet (75 mg total) by mouth daily. 90 tablet 3  . diltiazem (CARDIZEM CD) 360 MG 24 hr capsule Take 1 capsule (360 mg total) by mouth daily. 90 capsule 4  . furosemide (LASIX) 80 MG tablet Take 1 tablet (80 mg total) by mouth daily. 180 tablet 1  . glucose blood (ACCU-CHEK AVIVA PLUS) test strip CHECK BLOOD SUGAR ONCE DAILY.  Dx E11.51 100 each 2  . Glycopyrrolate-Formoterol (BEVESPI AEROSPHERE) 9-4.8 MCG/ACT AERO Inhale 2 puffs into the lungs 2 (two) times daily. 1 Inhaler 5  . hydrALAZINE (APRESOLINE) 25 MG tablet Take 1 tablet (25 mg total) by mouth 3 (three) times daily. 270 tablet 1  . insulin degludec (TRESIBA FLEXTOUCH) 100 UNIT/ML SOPN FlexTouch Pen Inject 0.28 mLs (28 Units total) into the skin at bedtime. 15 pen 2  . levalbuterol (XOPENEX HFA) 45 MCG/ACT inhaler Inhale 1-2 puffs into the lungs every 6 (six) hours as needed for wheezing. 1 Inhaler 5  . metFORMIN (GLUCOPHAGE) 1000 MG tablet TAKE 1 TABLET TWICE DAILY WITH A MEAL 180 tablet 4  . metoprolol tartrate (LOPRESSOR) 100 MG tablet Take 1 tablet (100 mg total) by mouth 2 (two) times daily. 180 tablet 4  . nitroGLYCERIN (NITROSTAT) 0.4 MG SL tablet Place 1 tablet (0.4 mg total) under the tongue every 5 (five) minutes as needed for chest pain. X 3 doses 25 tablet 4  . polyethylene glycol (MIRALAX / GLYCOLAX) packet Take 17 g by mouth daily as needed for mild constipation.     No facility-administered medications prior to visit.     Allergies:   Contrast media [iodinated diagnostic agents]   Social History   Socioeconomic History  . Marital status: Married    Spouse name: Not on file  . Number of children: 5  . Years of  education: Not on file  . Highest education level: Not on file  Occupational History  . Occupation: [employer]  Tobacco Use  . Smoking status: Former Smoker    Packs/day: 1.00    Years: 49.00    Pack years: 49.00    Types: Cigarettes    Quit date: 09/08/2015    Years since quitting: 3.4  . Smokeless tobacco: Never Used  . Tobacco comment: passive smoker as well  Substance and Sexual Activity  . Alcohol use: No    Alcohol/week: 0.0 standard drinks  . Drug use: No  . Sexual activity: Not on file  Other Topics Concern  . Not on file  Social History Narrative  . Not on file  Social Determinants of Health   Financial Resource Strain:   . Difficulty of Paying Living Expenses: Not on file  Food Insecurity:   . Worried About Charity fundraiser in the Last Year: Not on file  . Ran Out of Food in the Last Year: Not on file  Transportation Needs:   . Lack of Transportation (Medical): Not on file  . Lack of Transportation (Non-Medical): Not on file  Physical Activity:   . Days of Exercise per Week: Not on file  . Minutes of Exercise per Session: Not on file  Stress:   . Feeling of Stress : Not on file  Social Connections:   . Frequency of Communication with Friends and Family: Not on file  . Frequency of Social Gatherings with Friends and Family: Not on file  . Attends Religious Services: Not on file  . Active Member of Clubs or Organizations: Not on file  . Attends Archivist Meetings: Not on file  . Marital Status: Not on file     Family History:  The patient's family history includes Diabetes in his brother and mother; Heart attack in his brother, father, and mother; Hypertension in his father.   ROS:   Please see the history of present illness.    ROS All other systems reviewed and are negative.   PHYSICAL EXAM:   VS:  BP 130/72   Pulse 75   Temp (!) 96.8 F (36 C)   Ht 5\' 10"  (1.778 m)   Wt 184 lb 6.4 oz (83.6 kg)   SpO2 96%   BMI 26.46 kg/m      GENERAL:  Chronically ill appearing WM in NAD HEENT:  PERRL, EOMI, sclera are clear. Oropharynx is clear. NECK:  No jugular venous distention, carotid upstroke brisk and symmetric, no bruits, no thyromegaly or adenopathy LUNGS:  Clear to auscultation bilaterally CHEST:  Unremarkable HEART:  IRRR,  PMI not displaced or sustained,S1 and S2 within normal limits, no S3, no S4: no clicks, no rubs, no murmurs ABD:  Soft, nontender. BS +, no masses or bruits. No hepatomegaly, no splenomegaly EXT:  2 + pulses throughout, no edema, no cyanosis no clubbing SKIN:  Warm and dry.  No rashes NEURO:  Alert and oriented x 3. Cranial nerves II through XII intact. PSYCH:  Cognitively intact    Wt Readings from Last 3 Encounters:  03/06/19 184 lb 6.4 oz (83.6 kg)  01/24/19 183 lb 4.8 oz (83.1 kg)  10/25/18 186 lb 14.4 oz (84.8 kg)      Studies/Labs Reviewed:   EKG:  EKG is not ordered today.    Recent Labs: No results found for requested labs within last 8760 hours.   Lipid Panel    Component Value Date/Time   CHOL 161 12/03/2017 1210   TRIG 120 12/03/2017 1210   HDL 43 12/03/2017 1210   CHOLHDL 3.7 12/03/2017 1210   CHOLHDL 2.9 09/25/2015 0221   VLDL 13 09/25/2015 0221   LDLCALC 94 12/03/2017 1210    Additional studies/ records that were reviewed today include:   Cath 09/26/2015  Prox LAD to Mid LAD lesion, 30 %stenosed.  1st Diag lesion, 90 %stenosed.  Ost 1st Mrg to 1st Mrg lesion, 80 %stenosed.  Ost RCA to Prox RCA lesion, 30 %stenosed.  Mid RCA-2 lesion, 40 %stenosed.  Mid RCA-1 lesion, 15 %stenosed.  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  There is mild left ventricular systolic dysfunction.  The  left ventricular ejection fraction is 50-55% by visual estimate.  LV end diastolic pressure is moderately elevated.  A drug eluting stent was successfully placed.  2nd Mrg lesion, 90 %stenosed.  Post intervention, there is a 0% residual  stenosis.  1. 3 vessel obstructive CAD.  - chronic severe stenosis in the first diagonal - New 90% stenosis in a large second OM - Stents in the proximal and mid RCA are patent - Aneurysm and stenosis in the PLOM branch of the RCA- the aneurysm is new compared to 2009 2. Mild LV dysfunction 3. Elevated LVEDP 4. Allergic response to contrast with hives and bronchospasm. 5. Successful stenting of the second OM with a Biofreedom stent.   Plan: continue IV diuresis. Patient received steroids, Benadryl, and IV pepcid for contrast reaction. Continue DAPT for one month then DC ASA. Resume Eliquis in am.     Echo 11/05/2017 LV EF: 60% - 65% Study Conclusions  - Left ventricle: The cavity size was normal. There was moderate concentric hypertrophy. Systolic function was normal. The estimated ejection fraction was in the range of 60% to 65%. Wall motion was normal; there were no regional wall motion abnormalities. The study was not technically sufficient to allow evaluation of LV diastolic dysfunction due to atrial fibrillation. - Aortic valve: Poorly visualized. Trileaflet; moderately thickened, moderately calcified leaflets. - Aorta: Aortic root dimension: 40 mm (ED). Ascending aortic diameter: 38 mm (S). - Aortic root: The aortic root was mildly dilated. - Ascending aorta: The ascending aorta was mildly dilated. - Left atrium: The atrium was moderately dilated. - Right atrium: The atrium was moderately dilated. - Pulmonic valve: There was mild regurgitation. - Pulmonary arteries: Systolic pressure could not be accurately estimated.     ASSESSMENT:    1. Chronic atrial fibrillation ([place])   2. Coronary artery disease involving native coronary artery of native heart without angina pectoris   3. Chronic diastolic congestive heart failure ([place])   4. S/P AAA repair   5. Dyslipidemia      PLAN:  In order of problems listed  above:  1. Chronic atrial fibrillation. Rate is well controlled and he is on Eliquis continue current therapy.: Appears to be  euvolemic on physical exam.  Edema is improved and weight is down. Will continue current diuretic therapy and sodium restriction.   2. CAD: s/p multiple interventions in the past- last in 2017. On Plavix and lopressor. On statin. No angina.  3. Chronic diastolic CHF: volume status is good on current lasix dose.   4. Hyperlipidemia: On Lipitor 40 mg daily.  Will update fasting lab work  5. History of AAA repair: Followed by Dr. Trula Slade  6..  DM type 2 on metformin. Check A1c.     Medication Adjustments/Labs and Tests Ordered: Current medicines are reviewed at length with the patient today.  Concerns regarding medicines are outlined above.  Medication changes, Labs and Tests ordered today are listed in the Patient Instructions below. There are no Patient Instructions on file for this visit.   Signed, Cloa Bushong Martinique, MD  03/06/2019 1:35 PM    [facility] [address], [place], [place]  [address] Phone: [phone]; Fax: [phone]

## 2019-03-06 ENCOUNTER — Other Ambulatory Visit: Payer: Self-pay

## 2019-03-06 ENCOUNTER — Encounter: Payer: Self-pay | Admitting: Cardiology

## 2019-03-06 ENCOUNTER — Encounter (INDEPENDENT_AMBULATORY_CARE_PROVIDER_SITE_OTHER): Payer: Self-pay

## 2019-03-06 ENCOUNTER — Ambulatory Visit: Payer: Medicare Other | Admitting: Cardiology

## 2019-03-06 VITALS — BP 130/72 | HR 75 | Temp 96.8°F | Ht 70.0 in | Wt 184.4 lb

## 2019-03-06 DIAGNOSIS — I5032 Chronic diastolic (congestive) heart failure: Secondary | ICD-10-CM

## 2019-03-06 DIAGNOSIS — I2510 Atherosclerotic heart disease of native coronary artery without angina pectoris: Secondary | ICD-10-CM

## 2019-03-06 DIAGNOSIS — Z8679 Personal history of other diseases of the circulatory system: Secondary | ICD-10-CM

## 2019-03-06 DIAGNOSIS — E785 Hyperlipidemia, unspecified: Secondary | ICD-10-CM | POA: Diagnosis not present

## 2019-03-06 DIAGNOSIS — E119 Type 2 diabetes mellitus without complications: Secondary | ICD-10-CM | POA: Diagnosis not present

## 2019-03-06 DIAGNOSIS — Z9889 Other specified postprocedural states: Secondary | ICD-10-CM

## 2019-03-06 DIAGNOSIS — I482 Chronic atrial fibrillation, unspecified: Secondary | ICD-10-CM | POA: Diagnosis not present

## 2019-03-07 LAB — CBC WITH DIFFERENTIAL/PLATELET
Basophils Absolute: 0.1 10*3/uL (ref 0.0–0.2)
Basos: 1 %
EOS (ABSOLUTE): 0.3 10*3/uL (ref 0.0–0.4)
Eos: 3 %
Hematocrit: 43.7 % (ref 37.5–51.0)
Hemoglobin: 14.2 g/dL (ref 13.0–17.7)
Immature Grans (Abs): 0 10*3/uL (ref 0.0–0.1)
Immature Granulocytes: 0 %
Lymphocytes Absolute: 2.8 10*3/uL (ref 0.7–3.1)
Lymphs: 33 %
MCH: 28.6 pg (ref 26.6–33.0)
MCHC: 32.5 g/dL (ref 31.5–35.7)
MCV: 88 fL (ref 79–97)
Monocytes Absolute: 0.8 10*3/uL (ref 0.1–0.9)
Monocytes: 9 %
Neutrophils Absolute: 4.7 10*3/uL (ref 1.4–7.0)
Neutrophils: 54 %
Platelets: 180 10*3/uL (ref 150–450)
RBC: 4.97 x10E6/uL (ref 4.14–5.80)
RDW: 13.1 % (ref 11.6–15.4)
WBC: 8.7 10*3/uL (ref 3.4–10.8)

## 2019-03-07 LAB — LIPID PANEL
Chol/HDL Ratio: 2.8 ratio (ref 0.0–5.0)
Cholesterol, Total: 121 mg/dL (ref 100–199)
HDL: 43 mg/dL (ref >39)
LDL Chol Calc (NIH): 61 mg/dL (ref 0–99)
Triglycerides: 88 mg/dL (ref 0–149)
VLDL Cholesterol Cal: 17 mg/dL (ref 5–40)

## 2019-03-07 LAB — BASIC METABOLIC PANEL
BUN/Creatinine Ratio: 15 (ref 10–24)
BUN: 21 mg/dL (ref 8–27)
CO2: 25 mmol/L (ref 20–29)
Calcium: 9.3 mg/dL (ref 8.6–10.2)
Chloride: 106 mmol/L (ref 96–106)
Creatinine, Ser: 1.39 mg/dL — ABNORMAL HIGH (ref 0.76–1.27)
GFR calc Af Amer: 56 mL/min/{1.73_m2} — ABNORMAL LOW (ref >59)
GFR calc non Af Amer: 48 mL/min/{1.73_m2} — ABNORMAL LOW (ref >59)
Glucose: 155 mg/dL — ABNORMAL HIGH (ref 65–99)
Potassium: 4.5 mmol/L (ref 3.5–5.2)
Sodium: 146 mmol/L — ABNORMAL HIGH (ref 134–144)

## 2019-03-07 LAB — HEPATIC FUNCTION PANEL
ALT: 10 IU/L (ref 0–44)
AST: 16 IU/L (ref 0–40)
Albumin: 4.2 g/dL (ref 3.7–4.7)
Alkaline Phosphatase: 93 IU/L (ref 39–117)
Bilirubin Total: 0.5 mg/dL (ref 0.0–1.2)
Bilirubin, Direct: 0.17 mg/dL (ref 0.00–0.40)
Total Protein: 7.2 g/dL (ref 6.0–8.5)

## 2019-03-07 LAB — HEMOGLOBIN A1C
Est. average glucose Bld gHb Est-mCnc: 171 mg/dL
Hgb A1c MFr Bld: 7.6 % — ABNORMAL HIGH (ref 4.8–5.6)

## 2019-03-08 ENCOUNTER — Telehealth: Payer: Self-pay | Admitting: Cardiology

## 2019-03-08 NOTE — Telephone Encounter (Signed)
Returned call to patient's wife lab results given.

## 2019-03-08 NOTE — Telephone Encounter (Signed)
New Message  Patient's wife returning call for results. Please give patient/patient's wife a call back.

## 2019-03-10 ENCOUNTER — Ambulatory Visit: Payer: Medicare Other | Admitting: Cardiology

## 2019-04-25 ENCOUNTER — Ambulatory Visit: Payer: Medicare Other | Admitting: Internal Medicine

## 2019-05-11 ENCOUNTER — Other Ambulatory Visit: Payer: Self-pay

## 2019-05-12 ENCOUNTER — Encounter: Payer: Self-pay | Admitting: Internal Medicine

## 2019-05-12 ENCOUNTER — Ambulatory Visit (INDEPENDENT_AMBULATORY_CARE_PROVIDER_SITE_OTHER): Payer: Medicare Other | Admitting: Internal Medicine

## 2019-05-12 VITALS — BP 124/80 | HR 83 | Temp 97.9°F | Wt 182.1 lb

## 2019-05-12 DIAGNOSIS — E1151 Type 2 diabetes mellitus with diabetic peripheral angiopathy without gangrene: Secondary | ICD-10-CM

## 2019-05-12 LAB — POCT GLYCOSYLATED HEMOGLOBIN (HGB A1C): Hemoglobin A1C: 7.8 % — AB (ref 4.0–5.6)

## 2019-05-12 MED ORDER — TRESIBA FLEXTOUCH 100 UNIT/ML ~~LOC~~ SOPN: 32.0000 [IU] | PEN_INJECTOR | Freq: Every day | SUBCUTANEOUS | 2 refills | Status: DC

## 2019-05-12 NOTE — Patient Instructions (Signed)
-Nice seeing you today!!  -Increase Tresiba from 28 to 32 units at bedtime.  -Schedule follow up in 3 months.   Diabetes Mellitus and Nutrition, Adult When you have diabetes (diabetes mellitus), it is very important to have healthy eating habits because your blood sugar (glucose) levels are greatly affected by what you eat and drink. Eating healthy foods in the appropriate amounts, at about the same times every day, can help you:  Control your blood glucose.  Lower your risk of heart disease.  Improve your blood pressure.  Reach or maintain a healthy weight. Every person with diabetes is different, and each person has different needs for a meal plan. Your health care provider may recommend that you work with a diet and nutrition specialist (dietitian) to make a meal plan that is best for you. Your meal plan may vary depending on factors such as:  The calories you need.  The medicines you take.  Your weight.  Your blood glucose, blood pressure, and cholesterol levels.  Your activity level.  Other health conditions you have, such as heart or kidney disease. How do carbohydrates affect me? Carbohydrates, also called carbs, affect your blood glucose level more than any other type of food. Eating carbs naturally raises the amount of glucose in your blood. Carb counting is a method for keeping track of how many carbs you eat. Counting carbs is important to keep your blood glucose at a healthy level, especially if you use insulin or take certain oral diabetes medicines. It is important to know how many carbs you can safely have in each meal. This is different for every person. Your dietitian can help you calculate how many carbs you should have at each meal and for each snack. Foods that contain carbs include:  Bread, cereal, rice, pasta, and crackers.  Potatoes and corn.  Peas, beans, and lentils.  Milk and yogurt.  Fruit and juice.  Desserts, such as cakes, cookies, ice cream,  and candy. How does alcohol affect me? Alcohol can cause a sudden decrease in blood glucose (hypoglycemia), especially if you use insulin or take certain oral diabetes medicines. Hypoglycemia can be a life-threatening condition. Symptoms of hypoglycemia (sleepiness, dizziness, and confusion) are similar to symptoms of having too much alcohol. If your health care provider says that alcohol is safe for you, follow these guidelines:  Limit alcohol intake to no more than 1 drink per day for nonpregnant women and 2 drinks per day for men. One drink equals 12 oz of beer, 5 oz of wine, or 1 oz of hard liquor.  Do not drink on an empty stomach.  Keep yourself hydrated with water, diet soda, or unsweetened iced tea.  Keep in mind that regular soda, juice, and other mixers may contain a lot of sugar and must be counted as carbs. What are tips for following this plan?  Reading food labels  Start by checking the serving size on the "Nutrition Facts" label of packaged foods and drinks. The amount of calories, carbs, fats, and other nutrients listed on the label is based on one serving of the item. Many items contain more than one serving per package.  Check the total grams (g) of carbs in one serving. You can calculate the number of servings of carbs in one serving by dividing the total carbs by 15. For example, if a food has 30 g of total carbs, it would be equal to 2 servings of carbs.  Check the number of grams (g) of  saturated and trans fats in one serving. Choose foods that have low or no amount of these fats.  Check the number of milligrams (mg) of salt (sodium) in one serving. Most people should limit total sodium intake to less than 2,300 mg per day.  Always check the nutrition information of foods labeled as "low-fat" or "nonfat". These foods may be higher in added sugar or refined carbs and should be avoided.  Talk to your dietitian to identify your daily goals for nutrients listed on the  label. Shopping  Avoid buying canned, premade, or processed foods. These foods tend to be high in fat, sodium, and added sugar.  Shop around the outside edge of the grocery store. This includes fresh fruits and vegetables, bulk grains, fresh meats, and fresh dairy. Cooking  Use low-heat cooking methods, such as baking, instead of high-heat cooking methods like deep frying.  Cook using healthy oils, such as olive, canola, or sunflower oil.  Avoid cooking with butter, cream, or high-fat meats. Meal planning  Eat meals and snacks regularly, preferably at the same times every day. Avoid going long periods of time without eating.  Eat foods high in fiber, such as fresh fruits, vegetables, beans, and whole grains. Talk to your dietitian about how many servings of carbs you can eat at each meal.  Eat 4-6 ounces (oz) of lean protein each day, such as lean meat, chicken, fish, eggs, or tofu. One oz of lean protein is equal to: ? 1 oz of meat, chicken, or fish. ? 1 egg. ?  cup of tofu.  Eat some foods each day that contain healthy fats, such as avocado, nuts, seeds, and fish. Lifestyle  Check your blood glucose regularly.  Exercise regularly as told by your health care provider. This may include: ? 150 minutes of moderate-intensity or vigorous-intensity exercise each week. This could be brisk walking, biking, or water aerobics. ? Stretching and doing strength exercises, such as yoga or weightlifting, at least 2 times a week.  Take medicines as told by your health care provider.  Do not use any products that contain nicotine or tobacco, such as cigarettes and e-cigarettes. If you need help quitting, ask your health care provider.  Work with a Social worker or diabetes educator to identify strategies to manage stress and any emotional and social challenges. Questions to ask a health care provider  Do I need to meet with a diabetes educator?  Do I need to meet with a dietitian?  What  number can I call if I have questions?  When are the best times to check my blood glucose? Where to find more information:  American Diabetes Association: diabetes.org  Academy of Nutrition and Dietetics: www.eatright.[employer] of Diabetes and Digestive and Kidney Diseases (NIH): [employer] Summary  A healthy meal plan will help you control your blood glucose and maintain a healthy lifestyle.  Working with a diet and nutrition specialist (dietitian) can help you make a meal plan that is best for you.  Keep in mind that carbohydrates (carbs) and alcohol have immediate effects on your blood glucose levels. It is important to count carbs and to use alcohol carefully. This information is not intended to replace advice given to you by your health care provider. Make sure you discuss any questions you have with your health care provider. Document Revised: 01/01/2017 Document Reviewed: 02/24/2016 Elsevier Patient Education  2020 [employer].

## 2019-05-12 NOTE — Progress Notes (Signed)
Established Patient Office Visit     This visit occurred during the SARS-CoV-2 public health emergency.  Safety protocols were in place, including screening questions prior to the visit, additional usage of staff PPE, and extensive cleaning of exam room while observing appropriate contact time as indicated for disinfecting solutions.    CC/Reason for Visit: 3 to 77-month diabetic follow-up  HPI: Jason Fisher is a 79 y.o. male who is coming in today for the above mentioned reasons. Past Medical History is significant for: Chronic diastolic heart failure, benign essential hypertension,AAA,type 2 diabetes, chronic atrial fibrillation, chronic kidney disease stage III.He sees me mainly for diabetic care. We have been working on insulin titration and getting A1c down. There were some comprehension barriers initially. We had a HHRN reach out to him. He is doing better. Unfortunately, he does not bring his CBG log in today as requested. He has been on [place] 28 units at bedtime.  He eats a lot of eggs, bacon and fresh fruit.   Past Medical/Surgical History: Past Medical History:  Diagnosis Date  . 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 BENIGN PROSTATIC HYPERTROPHY, HX OF 02/24/2007  . CAD S/P PCI    a. 2007 Inf STEMI s/p Vision BMS  2 to RCA;  b. 2009 ISR Prox RCA Stent-->with DES;  c. 09/2015 PCI: LM nl, LAD 30p/m, D1 90, LCX nl, OM1 small, 80, OM2 90 (2.5x14 Biofreedom stent), OM3 small, RCA 30p ISR, 69m ISR, 91m, 85d, EF 50-55%.  . 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 Chronic atrial fibrillation ([place]) 11/06/2009   a. On Eliquis (CHA2DS2VASc = 5).  Rate controlled w/ BB and CCB.  Marland Kitchen Chronic diastolic CHF (congestive heart failure) ([place])    a. 09/2015 Echo: EF 55-60%, mildly dil LA.  . CKD (chronic kidney disease), stage III    they mentioned this to the patient but they would work on it later after the aneurysm  . COLONIC POLYPS, HX OF 11/02/2006  . COPD (chronic obstructive  pulmonary disease) ([place])   . DIABETES MELLITUS, TYPE II 10/29/2006  . Diverticulosis   . History of tobacco abuse    a. Quit 09/2015.  Marland Kitchen HYPERLIPIDEMIA 10/29/2006  . Hypertensive heart disease with heart failure ([place])   . Pneumonia 09/2015   [name] 07/31/2016  . STEMI (ST elevation myocardial infarction) [facility]) 2007   [name] 07/31/2016    Past Surgical History:  Procedure Laterality Date  . ABDOMINAL AORTIC ENDOVASCULAR STENT GRAFT N/A 07/31/2016   Procedure: ABDOMINAL AORTIC ENDOVASCULAR STENT GRAFT;  Surgeon: Serafina Mitchell, MD;  Location: [place];  Service: Vascular;  Laterality: N/A;  . CARDIAC CATHETERIZATION N/A 09/26/2015   Procedure: Left Heart Cath and Coronary Angiography;  Surgeon: Peter M Martinique, MD;  Location: [place] [facility];  Service: Cardiovascular;  Laterality: N/A;  . CARDIAC CATHETERIZATION N/A 09/26/2015   Procedure: Coronary Stent Intervention;  Surgeon: Peter M Martinique, MD;  Location: [place] [facility];  Service: Cardiovascular;  Laterality: N/A;  . CATARACT EXTRACTION Bilateral   . COLONOSCOPY    . CORONARY ANGIOPLASTY WITH STENT PLACEMENT  2007   Inferior STEMI 2007 - RCA PCI with 2 Vision BMS; Abnormal Myoview in 2009 - pRCA ins-stent restenosis & unstented disease - DES PCI Promus 3.0 x 20 mm (3.5 mm)  . ENDOVASCULAR REPAIR/STENT GRAFT  07/31/2016  . INCISION AND DRAINAGE PERIRECTAL ABSCESS N/A 11/06/2015   Procedure: IRRIGATION AND DEBRIDEMENT PERIRECTAL ABSCESS, POSSIBLE HEMORRHOIDECTOMY;  Surgeon: Coralie Keens, MD;  Location: [facility];  Service: General;  Laterality: N/A;  . ORIF SHOULDER FRACTURE Right    "fell out of back end of a truck"    Social History:  reports that he quit smoking about 3 years ago. His smoking use included cigarettes. He has a 49.00 pack-year smoking history. He has never used smokeless tobacco. He reports that he does not drink alcohol or use drugs.  Allergies: Allergies  Allergen Reactions  . Contrast Media [Iodinated Diagnostic  Agents] Anaphylaxis    Family History:  Family History  Problem Relation Age of Onset  . Heart attack Mother   . Diabetes Mother   . Heart attack Father   . Hypertension Father   . Heart attack Brother   . Diabetes Brother      Current Outpatient Medications:  .  apixaban (ELIQUIS) 5 MG TABS tablet, Take 1 tablet (5 mg total) by mouth 2 (two) times daily., Disp: 60 tablet, Rfl: 4 .  atorvastatin (LIPITOR) 40 MG tablet, Take 1 tablet (40 mg total) by mouth daily., Disp: 90 tablet, Rfl: 3 .  clopidogrel (PLAVIX) 75 MG tablet, Take 1 tablet (75 mg total) by mouth daily., Disp: 90 tablet, Rfl: 3 .  diltiazem (CARDIZEM CD) 360 MG 24 hr capsule, Take 1 capsule (360 mg total) by mouth daily., Disp: 90 capsule, Rfl: 4 .  furosemide (LASIX) 80 MG tablet, Take 1 tablet (80 mg total) by mouth daily., Disp: 180 tablet, Rfl: 1 .  glucose blood (ACCU-CHEK AVIVA PLUS) test strip, CHECK BLOOD SUGAR ONCE DAILY.  Dx E11.51, Disp: 100 each, Rfl: 2 .  Glycopyrrolate-Formoterol (BEVESPI AEROSPHERE) 9-4.8 MCG/ACT AERO, Inhale 2 puffs into the lungs 2 (two) times daily., Disp: 1 Inhaler, Rfl: 5 .  hydrALAZINE (APRESOLINE) 25 MG tablet, Take 1 tablet (25 mg total) by mouth 3 (three) times daily., Disp: 270 tablet, Rfl: 1 .  insulin degludec (TRESIBA FLEXTOUCH) 100 UNIT/ML FlexTouch Pen, Inject 0.32 mLs (32 Units total) into the skin at bedtime., Disp: 15 pen, Rfl: 2 .  levalbuterol (XOPENEX HFA) 45 MCG/ACT inhaler, Inhale 1-2 puffs into the lungs every 6 (six) hours as needed for wheezing., Disp: 1 Inhaler, Rfl: 5 .  metFORMIN (GLUCOPHAGE) 1000 MG tablet, TAKE 1 TABLET TWICE DAILY WITH A MEAL, Disp: 180 tablet, Rfl: 4 .  metoprolol tartrate (LOPRESSOR) 100 MG tablet, Take 1 tablet (100 mg total) by mouth 2 (two) times daily., Disp: 180 tablet, Rfl: 4 .  nitroGLYCERIN (NITROSTAT) 0.4 MG SL tablet, Place 1 tablet (0.4 mg total) under the tongue every 5 (five) minutes as needed for chest pain. X 3 doses, Disp: 25  tablet, Rfl: 4 .  polyethylene glycol (MIRALAX / GLYCOLAX) packet, Take 17 g by mouth daily as needed for mild constipation., Disp: , Rfl:   Review of Systems:  Constitutional: Denies fever, chills, diaphoresis, appetite change and fatigue.  HEENT: Denies photophobia, eye pain, redness, hearing loss, ear pain, congestion, sore throat, rhinorrhea, sneezing, mouth sores, trouble swallowing, neck pain, neck stiffness and tinnitus.   Respiratory: Denies SOB, DOE, cough, chest tightness,  and wheezing.   Cardiovascular: Denies chest pain, palpitations and leg swelling.  Gastrointestinal: Denies nausea, vomiting, abdominal pain, diarrhea, constipation, blood in stool and abdominal distention.  Genitourinary: Denies dysuria, urgency, frequency, hematuria, flank pain and difficulty urinating.  Endocrine: Denies: hot or cold intolerance, sweats, changes in hair or nails, polyuria, polydipsia. Musculoskeletal: Denies myalgias, back pain, joint swelling, arthralgias and gait problem.  Skin: Denies pallor, rash and wound.  Neurological: Denies dizziness, seizures, syncope, weakness, light-headedness, numbness and headaches.  Hematological: Denies adenopathy. Easy bruising, personal or family bleeding history  Psychiatric/Behavioral: Denies suicidal ideation, mood changes, confusion, nervousness, sleep disturbance and agitation    Physical Exam: Vitals:   05/12/19 1034  BP: 124/80  Pulse: 83  Temp: 97.9 F (36.6 C)  TempSrc: Temporal  SpO2: 96%  Weight: 182 lb 1.6 oz (82.6 kg)    Body mass index is 26.13 kg/m.   Constitutional: NAD, calm, comfortable Eyes: PERRL, lids and conjunctivae normal, wears corrective lenses ENMT: Mucous membranes are moist. Respiratory: clear to auscultation bilaterally, no wheezing, no crackles. Normal respiratory effort. No accessory muscle use.  Cardiovascular: Regular rate and rhythm, no murmurs / rubs / gallops. No extremity edema.  Neurologic: Grossly intact  and nonfocal Psychiatric: Normal judgment and insight. Alert and oriented x 3. Normal mood.    Impression and Plan:  DM (diabetes mellitus), type 2 with peripheral vascular complications ([place])  -[id number] today has risen from 7.1 in December to 7.8, suspect due to dietary indiscretions. -We have discussed endocrine referral today, he would prefer to increase insulin and work on diet prior to initiating this referral. -I will increase his T[place] to 32 units at bedtime, have provided handout on nutrition. -In 3 months.    Patient Instructions  -Nice seeing you today!!  -Increase Tresiba from 28 to 32 units at bedtime.  -Schedule follow up in 3 months.   Diabetes Mellitus and Nutrition, Adult When you have diabetes (diabetes mellitus), it is very important to have healthy eating habits because your blood sugar (glucose) levels are greatly affected by what you eat and drink. Eating healthy foods in the appropriate amounts, at about the same times every day, can help you:  Control your blood glucose.  Lower your risk of heart disease.  Improve your blood pressure.  Reach or maintain a healthy weight. Every person with diabetes is different, and each person has different needs for a meal plan. Your health care provider may recommend that you work with a diet and nutrition specialist (dietitian) to make a meal plan that is best for you. Your meal plan may vary depending on factors such as:  The calories you need.  The medicines you take.  Your weight.  Your blood glucose, blood pressure, and cholesterol levels.  Your activity level.  Other health conditions you have, such as heart or kidney disease. How do carbohydrates affect me? Carbohydrates, also called carbs, affect your blood glucose level more than any other type of food. Eating carbs naturally raises the amount of glucose in your blood. Carb counting is a method for keeping track of how many carbs you eat. Counting carbs is  important to keep your blood glucose at a healthy level, especially if you use insulin or take certain oral diabetes medicines. It is important to know how many carbs you can safely have in each meal. This is different for every person. Your dietitian can help you calculate how many carbs you should have at each meal and for each snack. Foods that contain carbs include:  Bread, cereal, rice, pasta, and crackers.  Potatoes and corn.  Peas, beans, and lentils.  Milk and yogurt.  Fruit and juice.  Desserts, such as cakes, cookies, ice cream, and candy. How does alcohol affect me? Alcohol can cause a sudden decrease in blood glucose (hypoglycemia), especially if you use insulin or take certain oral diabetes medicines. Hypoglycemia can be a life-threatening condition. Symptoms of  hypoglycemia (sleepiness, dizziness, and confusion) are similar to symptoms of having too much alcohol. If your health care provider says that alcohol is safe for you, follow these guidelines:  Limit alcohol intake to no more than 1 drink per day for nonpregnant women and 2 drinks per day for men. One drink equals 12 oz of beer, 5 oz of wine, or 1 oz of hard liquor.  Do not drink on an empty stomach.  Keep yourself hydrated with water, diet soda, or unsweetened iced tea.  Keep in mind that regular soda, juice, and other mixers may contain a lot of sugar and must be counted as carbs. What are tips for following this plan?  Reading food labels  Start by checking the serving size on the "Nutrition Facts" label of packaged foods and drinks. The amount of calories, carbs, fats, and other nutrients listed on the label is based on one serving of the item. Many items contain more than one serving per package.  Check the total grams (g) of carbs in one serving. You can calculate the number of servings of carbs in one serving by dividing the total carbs by 15. For example, if a food has 30 g of total carbs, it would be  equal to 2 servings of carbs.  Check the number of grams (g) of saturated and trans fats in one serving. Choose foods that have low or no amount of these fats.  Check the number of milligrams (mg) of salt (sodium) in one serving. Most people should limit total sodium intake to less than 2,300 mg per day.  Always check the nutrition information of foods labeled as "low-fat" or "nonfat". These foods may be higher in added sugar or refined carbs and should be avoided.  Talk to your dietitian to identify your daily goals for nutrients listed on the label. Shopping  Avoid buying canned, premade, or processed foods. These foods tend to be high in fat, sodium, and added sugar.  Shop around the outside edge of the grocery store. This includes fresh fruits and vegetables, bulk grains, fresh meats, and fresh dairy. Cooking  Use low-heat cooking methods, such as baking, instead of high-heat cooking methods like deep frying.  Cook using healthy oils, such as olive, canola, or sunflower oil.  Avoid cooking with butter, cream, or high-fat meats. Meal planning  Eat meals and snacks regularly, preferably at the same times every day. Avoid going long periods of time without eating.  Eat foods high in fiber, such as fresh fruits, vegetables, beans, and whole grains. Talk to your dietitian about how many servings of carbs you can eat at each meal.  Eat 4-6 ounces (oz) of lean protein each day, such as lean meat, chicken, fish, eggs, or tofu. One oz of lean protein is equal to: ? 1 oz of meat, chicken, or fish. ? 1 egg. ?  cup of tofu.  Eat some foods each day that contain healthy fats, such as avocado, nuts, seeds, and fish. Lifestyle  Check your blood glucose regularly.  Exercise regularly as told by your health care provider. This may include: ? 150 minutes of moderate-intensity or vigorous-intensity exercise each week. This could be brisk walking, biking, or water aerobics. ? Stretching and  doing strength exercises, such as yoga or weightlifting, at least 2 times a week.  Take medicines as told by your health care provider.  Do not use any products that contain nicotine or tobacco, such as cigarettes and e-cigarettes. If you need help  quitting, ask your health care provider.  Work with a Social worker or diabetes educator to identify strategies to manage stress and any emotional and social challenges. Questions to ask a health care provider  Do I need to meet with a diabetes educator?  Do I need to meet with a dietitian?  What number can I call if I have questions?  When are the best times to check my blood glucose? Where to find more information:  American Diabetes Association: diabetes.org  Academy of Nutrition and Dietetics: www.eatright.[employer] of Diabetes and Digestive and Kidney Diseases (NIH): [employer] Summary  A healthy meal plan will help you control your blood glucose and maintain a healthy lifestyle.  Working with a diet and nutrition specialist (dietitian) can help you make a meal plan that is best for you.  Keep in mind that carbohydrates (carbs) and alcohol have immediate effects on your blood glucose levels. It is important to count carbs and to use alcohol carefully. This information is not intended to replace advice given to you by your health care provider. Make sure you discuss any questions you have with your health care provider. Document Revised: 01/01/2017 Document Reviewed: 02/24/2016 Elsevier Patient Education  2020 [place], MD [facility]

## 2019-05-15 ENCOUNTER — Telehealth: Payer: Self-pay | Admitting: Cardiology

## 2019-05-15 NOTE — Telephone Encounter (Signed)
Called patient left message on personal voice mail Dr.Jordan's advice.Appointment scheduled with Dr.Jordan 4/15 at 11:00 am.Advised to call me back tomorrow if that is not a good time.Advised to go to ED if sob worsens.

## 2019-05-15 NOTE — Telephone Encounter (Signed)
Pt c/o Shortness Of Breath: STAT if SOB developed within the last 24 hours or pt is noticeably SOB on the phone  1. Are you currently SOB (can you hear that pt is SOB on the phone)? No  2. How long have you been experiencing SOB? Past few months, per patient's wife  3. Are you SOB when sitting or when up moving around? When up and moving around  4. Are you currently experiencing any other symptoms? Dizziness

## 2019-05-15 NOTE — Telephone Encounter (Signed)
Symptoms may well be pulmonary related but we should see him as well. I would make him an appointment to see me or APP.  Quan Cybulski Martinique MD, [facility]

## 2019-05-15 NOTE — Telephone Encounter (Signed)
Spoke with patient's wife. She reports he is SOB when he walks up steps, takes a shower. Patient reports he told Dr. Martinique about this in Feb 2021. He denies weight gain, swelling. He has dizziness and fatigue - exhausted just after having to use the bathroom. He has not seen pulmonary MD is >1 year. Advised EF per last echo was normal  His oxygen is 94% on RA at home.  She reports his BP is pretty good at home but no readings  Advised will send message to [place] and advised they should contact Dr. Lamonte Sakai as well (phone # and new address provided)

## 2019-05-16 NOTE — Progress Notes (Signed)
Cardiology Office Note    Date:  05/18/2019   ID:  Jason Fisher, DOB 18-May-1940, MRN [id number]  PCP:  [name], [name], MD  Cardiologist:  Dr. Martinique   Chief Complaint  Patient presents with  . Shortness of Breath    History of Present Illness:  Jason Fisher is a 79 y.o. male seen for evaluation of SOB and fatigue. He has a PMH of CAD (s/p BMS to RCA 2007, ISR with DES to RCA in 2009, DES to OM2 in 09/2015),chronic atrial fibrillation on Eliquis, chronic diastolic heart failure,HTN, HLD, AAA, COPD,and tobacco use. He was previously admitted in March 2018 for atrial fibrillation with RVR. Troponin was borderline elevated. He was initially treated with IV Cardizem and later switched to p.o. Cardizem prior to discharge. He also underwent AAA endovascular stent grafting by Dr. Trula Slade in June 2018. He was seen in October 2019 by [name] PA-C for increased edema.  Lasix was increased to 80 mg twice daily for 3 days before decreasing back down to 40 mg twice daily thereafter.  Echocardiogram on 11/05/2017 this showed EF 60 to 65%, mildly increased aortic root measuring 40 mm, moderately dilated left and the right atrium.    He called this week with complaints of increased SOB with exertion and fatigue. 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 States this has been going on for 6 months. No increased swelling or weight gain. Spits up a lot of clear phlegm. Denies any chest pain. States he gave up smoking.   Past Medical History:  Diagnosis Date  . 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 BENIGN PROSTATIC HYPERTROPHY, HX OF 02/24/2007  . CAD S/P PCI    a. 2007 Inf STEMI s/p Vision BMS  2 to RCA;  b. 2009 ISR Prox RCA Stent-->with DES;  c. 09/2015 PCI: LM nl, LAD 30p/m, D1 90, LCX nl, OM1 small, 80, OM2 90 (2.5x14 Biofreedom stent), OM3 small, RCA 30p ISR, 38m ISR, 72m, 85d, EF 50-55%.  . 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  Chronic atrial fibrillation ([place]) 11/06/2009   a. On Eliquis (CHA2DS2VASc = 5).  Rate controlled w/ BB and CCB.  Marland Kitchen Chronic diastolic CHF (congestive heart failure) ([place])    a. 09/2015 Echo: EF 55-60%, mildly dil LA.  . CKD (chronic kidney disease), stage III    they mentioned this to the patient but they would work on it later after the aneurysm  . COLONIC POLYPS, HX OF 11/02/2006  . COPD (chronic obstructive pulmonary disease) ([place])   . DIABETES MELLITUS, TYPE II 10/29/2006  . Diverticulosis   . History of tobacco abuse    a. Quit 09/2015.  Marland Kitchen HYPERLIPIDEMIA 10/29/2006  . Hypertensive heart disease with heart failure ([place])   . Pneumonia 09/2015   [name] 07/31/2016  . STEMI (ST elevation myocardial infarction) [facility]) 2007   [name] 07/31/2016    Past Surgical History:  Procedure Laterality Date  . ABDOMINAL AORTIC ENDOVASCULAR STENT GRAFT N/A 07/31/2016   Procedure: ABDOMINAL AORTIC ENDOVASCULAR STENT GRAFT;  Surgeon: Serafina Mitchell, MD;  Location: [place];  Service: Vascular;  Laterality: N/A;  . CARDIAC CATHETERIZATION N/A 09/26/2015   Procedure: Left Heart Cath and Coronary Angiography;  Surgeon: Lexis Potenza M Martinique, MD;  Location: [place] [facility];  Service: Cardiovascular;  Laterality: N/A;  . CARDIAC CATHETERIZATION N/A 09/26/2015   Procedure: Coronary Stent Intervention;  Surgeon: Molley Houser M Martinique,  MD;  Location: [place] [facility];  Service: Cardiovascular;  Laterality: N/A;  . CATARACT EXTRACTION Bilateral   . COLONOSCOPY    . CORONARY ANGIOPLASTY WITH STENT PLACEMENT  2007   Inferior STEMI 2007 - RCA PCI with 2 Vision BMS; Abnormal Myoview in 2009 - pRCA ins-stent restenosis & unstented disease - DES PCI Promus 3.0 x 20 mm (3.5 mm)  . ENDOVASCULAR REPAIR/STENT GRAFT  07/31/2016  . INCISION AND DRAINAGE PERIRECTAL ABSCESS N/A 11/06/2015   Procedure: IRRIGATION AND DEBRIDEMENT PERIRECTAL ABSCESS, POSSIBLE HEMORRHOIDECTOMY;  Surgeon: Coralie Keens, MD;  Location: [place];  Service: General;   Laterality: N/A;  . ORIF SHOULDER FRACTURE Right    "fell out of back end of a truck"    Current Medications: Outpatient Medications Prior to Visit  Medication Sig Dispense Refill  . apixaban (ELIQUIS) 5 MG TABS tablet Take 1 tablet (5 mg total) by mouth 2 (two) times daily. 60 tablet 4  . atorvastatin (LIPITOR) 40 MG tablet Take 1 tablet (40 mg total) by mouth daily. 90 tablet 3  . clopidogrel (PLAVIX) 75 MG tablet Take 1 tablet (75 mg total) by mouth daily. 90 tablet 3  . diltiazem (CARDIZEM CD) 360 MG 24 hr capsule Take 1 capsule (360 mg total) by mouth daily. 90 capsule 4  . furosemide (LASIX) 80 MG tablet Take 1 tablet (80 mg total) by mouth daily. 180 tablet 1  . glucose blood (ACCU-CHEK AVIVA PLUS) test strip CHECK BLOOD SUGAR ONCE DAILY.  Dx E11.51 100 each 2  . Glycopyrrolate-Formoterol (BEVESPI AEROSPHERE) 9-4.8 MCG/ACT AERO Inhale 2 puffs into the lungs 2 (two) times daily. 1 Inhaler 5  . hydrALAZINE (APRESOLINE) 25 MG tablet Take 1 tablet (25 mg total) by mouth 3 (three) times daily. 270 tablet 1  . insulin degludec (TRESIBA FLEXTOUCH) 100 UNIT/ML FlexTouch Pen Inject 0.32 mLs (32 Units total) into the skin at bedtime. 15 pen 2  . levalbuterol (XOPENEX HFA) 45 MCG/ACT inhaler Inhale 1-2 puffs into the lungs every 6 (six) hours as needed for wheezing. 1 Inhaler 5  . metFORMIN (GLUCOPHAGE) 1000 MG tablet TAKE 1 TABLET TWICE DAILY WITH A MEAL 180 tablet 4  . metoprolol tartrate (LOPRESSOR) 100 MG tablet Take 1 tablet (100 mg total) by mouth 2 (two) times daily. 180 tablet 4  . nitroGLYCERIN (NITROSTAT) 0.4 MG SL tablet Place 1 tablet (0.4 mg total) under the tongue every 5 (five) minutes as needed for chest pain. X 3 doses 25 tablet 4  . polyethylene glycol (MIRALAX / GLYCOLAX) packet Take 17 g by mouth daily as needed for mild constipation.     No facility-administered medications prior to visit.     Allergies:   Contrast media [iodinated diagnostic agents]   Social History    Socioeconomic History  . Marital status: Married    Spouse name: Not on file  . Number of children: 5  . Years of education: Not on file  . Highest education level: Not on file  Occupational History  . Occupation: [employer]  Tobacco Use  . Smoking status: Former Smoker    Packs/day: 1.00    Years: 49.00    Pack years: 49.00    Types: Cigarettes    Quit date: 09/08/2015    Years since quitting: 3.6  . Smokeless tobacco: Never Used  . Tobacco comment: passive smoker as well  Substance and Sexual Activity  . Alcohol use: No    Alcohol/week: 0.0 standard drinks  . Drug use: No  .  Sexual activity: Not on file  Other Topics Concern  . Not on file  Social History Narrative  . Not on file   Social Determinants of Health   Financial Resource Strain:   . Difficulty of Paying Living Expenses:   Food Insecurity:   . Worried About Charity fundraiser in the Last Year:   . Arboriculturist in the Last Year:   Transportation Needs:   . Film/video editor (Medical):   Marland Kitchen Lack of Transportation (Non-Medical):   Physical Activity:   . Days of Exercise per Week:   . Minutes of Exercise per Session:   Stress:   . Feeling of Stress :   Social Connections:   . Frequency of Communication with Friends and Family:   . Frequency of Social Gatherings with Friends and Family:   . Attends Religious Services:   . Active Member of Clubs or Organizations:   . Attends Archivist Meetings:   Marland Kitchen Marital Status:      Family History:  The patient's family history includes Diabetes in his brother and mother; Heart attack in his brother, father, and mother; Hypertension in his father.   ROS:   Please see the history of present illness.    ROS All other systems reviewed and are negative.   PHYSICAL EXAM:   VS:  BP (!) 142/100   Pulse 84   Ht 5\' 9"  (1.753 m)   Wt 184 lb (83.5 kg)   SpO2 95%   BMI 27.17 kg/m    GENERAL:  Chronically ill appearing WM in NAD HEENT:  PERRL,  EOMI, sclera are clear. Oropharynx is clear. NECK:  No jugular venous distention, carotid upstroke brisk and symmetric, no bruits, no thyromegaly or adenopathy LUNGS:  Coarse BS bilaterally CHEST:  Unremarkable HEART:  IRRR,  PMI not displaced or sustained,S1 and S2 within normal limits, no S3, no S4: no clicks, no rubs, no murmurs ABD:  Soft, nontender. BS +, no masses or bruits. No hepatomegaly, no splenomegaly EXT:  2 + pulses throughout, no edema, no cyanosis no clubbing SKIN:  Warm and dry.  No rashes NEURO:  Alert and oriented x 3. Cranial nerves II through XII intact. PSYCH:  Cognitively intact    Wt Readings from Last 3 Encounters:  05/18/19 184 lb (83.5 kg)  05/12/19 182 lb 1.6 oz (82.6 kg)  03/06/19 184 lb 6.4 oz (83.6 kg)      Studies/Labs Reviewed:   EKG:  EKG is not ordered today.    Recent Labs: 03/06/2019: ALT 10; BUN 21; Creatinine, Ser 1.39; Hemoglobin 14.2; Platelets 180; Potassium 4.5; Sodium 146   Lipid Panel    Component Value Date/Time   CHOL 121 03/06/2019 1404   TRIG 88 03/06/2019 1404   HDL 43 03/06/2019 1404   CHOLHDL 2.8 03/06/2019 1404   CHOLHDL 2.9 09/25/2015 0221   VLDL 13 09/25/2015 0221   LDLCALC 61 03/06/2019 1404    Additional studies/ records that were reviewed today include:   Cath 09/26/2015  Prox LAD to Mid LAD lesion, 30 %stenosed.  1st Diag lesion, 90 %stenosed.  Ost 1st Mrg to 1st Mrg lesion, 80 %stenosed.  Ost RCA to Prox RCA lesion, 30 %stenosed.  Mid RCA-2 lesion, 40 %stenosed.  Mid RCA-1 lesion, 15 %stenosed.  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  There is mild left ventricular systolic dysfunction.  The left ventricular ejection fraction is 50-55% by visual estimate.  LV end diastolic pressure is moderately elevated.  A drug eluting stent was successfully placed.  2nd Mrg lesion, 90 %stenosed.  Post intervention, there is a 0% residual stenosis.  1. 3 vessel obstructive  CAD.  - chronic severe stenosis in the first diagonal - New 90% stenosis in a large second OM - Stents in the proximal and mid RCA are patent - Aneurysm and stenosis in the PLOM branch of the RCA- the aneurysm is new compared to 2009 2. Mild LV dysfunction 3. Elevated LVEDP 4. Allergic response to contrast with hives and bronchospasm. 5. Successful stenting of the second OM with a Biofreedom stent.   Plan: continue IV diuresis. Patient received steroids, Benadryl, and IV pepcid for contrast reaction. Continue DAPT for one month then DC ASA. Resume Eliquis in am.     Echo 11/05/2017 LV EF: 60% - 65% Study Conclusions  - Left ventricle: The cavity size was normal. There was moderate concentric hypertrophy. Systolic function was normal. The estimated ejection fraction was in the range of 60% to 65%. Wall motion was normal; there were no regional wall motion abnormalities. The study was not technically sufficient to allow evaluation of LV diastolic dysfunction due to atrial fibrillation. - Aortic valve: Poorly visualized. Trileaflet; moderately thickened, moderately calcified leaflets. - Aorta: Aortic root dimension: 40 mm (ED). Ascending aortic diameter: 38 mm (S). - Aortic root: The aortic root was mildly dilated. - Ascending aorta: The ascending aorta was mildly dilated. - Left atrium: The atrium was moderately dilated. - Right atrium: The atrium was moderately dilated. - Pulmonic valve: There was mild regurgitation. - Pulmonary arteries: Systolic pressure could not be accurately estimated.     ASSESSMENT:    1. Dyspnea on exertion   2. Chronic atrial fibrillation (HCC)   3. Coronary artery disease involving native coronary artery of native heart without angina pectoris   4. Chronic diastolic congestive heart failure ([place])   5. Type 2 diabetes mellitus without complication, without long-term current use of insulin ([place])   6.  Hyperlipidemia, unspecified hyperlipidemia type   7. Stage 3b chronic kidney disease      PLAN:  In order of problems listed above:  1. Chronic atrial fibrillation. Rate is well controlled and he is on Eliquis continue current therapy   2. CAD: s/p multiple interventions in the past- last in 2017. On Plavix and lopressor. On statin. No angina. While his dyspnea could be anginal equivalent I note that he always had chest pain in the past. I don't think stress testing will be very helpful at this point. I am not anxious to do coronary angiogram given his CKD and known history of significant contrast allergy with last cath.   3. Chronic diastolic CHF: volume status is good on current lasix dose. No edema and weight is stable.   4. Hyperlipidemia: On Lipitor 40 mg daily.  LDL 61  5. History of AAA repair: Followed by Dr. Trula Slade  6..  DM type 2 on metformin.  A1c- 7.6.   7.   Dyspnea on exertion. I think this is likely more pulmonary related. Significant tobacco history. Improved symptoms with albuterol. He is scheduled to see Dr Lamonte Sakai in 2 weeks. Will await his assessment.     Medication Adjustments/Labs and Tests Ordered: Current medicines are reviewed at length with the patient today.  Concerns regarding medicines are outlined above.  Medication changes, Labs and Tests ordered today are listed in the Patient Instructions below. There are no Patient Instructions on  file for this visit.   Signed, Beronica Lansdale Martinique, MD  05/18/2019 12:43 PM    [facility] [address], [place], [place]  [address] Phone: [phone]; Fax: [phone]

## 2019-05-18 ENCOUNTER — Encounter: Payer: Self-pay | Admitting: Cardiology

## 2019-05-18 ENCOUNTER — Ambulatory Visit: Payer: Medicare Other | Admitting: Cardiology

## 2019-05-18 ENCOUNTER — Other Ambulatory Visit: Payer: Self-pay

## 2019-05-18 VITALS — BP 142/100 | HR 84 | Ht 69.0 in | Wt 184.0 lb

## 2019-05-18 DIAGNOSIS — R0600 Dyspnea, unspecified: Secondary | ICD-10-CM

## 2019-05-18 DIAGNOSIS — I5032 Chronic diastolic (congestive) heart failure: Secondary | ICD-10-CM | POA: Diagnosis not present

## 2019-05-18 DIAGNOSIS — N1832 Chronic kidney disease, stage 3b: Secondary | ICD-10-CM

## 2019-05-18 DIAGNOSIS — I482 Chronic atrial fibrillation, unspecified: Secondary | ICD-10-CM | POA: Diagnosis not present

## 2019-05-18 DIAGNOSIS — I2510 Atherosclerotic heart disease of native coronary artery without angina pectoris: Secondary | ICD-10-CM | POA: Diagnosis not present

## 2019-05-18 DIAGNOSIS — E785 Hyperlipidemia, unspecified: Secondary | ICD-10-CM

## 2019-05-18 DIAGNOSIS — R0609 Other forms of dyspnea: Secondary | ICD-10-CM

## 2019-05-18 DIAGNOSIS — E119 Type 2 diabetes mellitus without complications: Secondary | ICD-10-CM | POA: Diagnosis not present

## 2019-06-01 ENCOUNTER — Other Ambulatory Visit: Payer: Self-pay

## 2019-06-01 ENCOUNTER — Telehealth: Payer: Self-pay | Admitting: Internal Medicine

## 2019-06-01 ENCOUNTER — Encounter: Payer: Self-pay | Admitting: Emergency Medicine

## 2019-06-01 ENCOUNTER — Ambulatory Visit: Payer: Medicare Other | Admitting: Emergency Medicine

## 2019-06-01 DIAGNOSIS — J42 Unspecified chronic bronchitis: Secondary | ICD-10-CM | POA: Diagnosis not present

## 2019-06-01 DIAGNOSIS — E1151 Type 2 diabetes mellitus with diabetic peripheral angiopathy without gangrene: Secondary | ICD-10-CM

## 2019-06-01 DIAGNOSIS — I25708 Atherosclerosis of coronary artery bypass graft(s), unspecified, with other forms of angina pectoris: Secondary | ICD-10-CM

## 2019-06-01 DIAGNOSIS — N1832 Chronic kidney disease, stage 3b: Secondary | ICD-10-CM

## 2019-06-01 MED ORDER — LEVALBUTEROL TARTRATE 45 MCG/ACT IN AERO: 1.0000 | INHALATION_SPRAY | Freq: Four times a day (QID) | RESPIRATORY_TRACT | 3 refills | Status: DC | PRN

## 2019-06-01 MED ORDER — BEVESPI AEROSPHERE 9-4.8 MCG/ACT IN AERO: 2.0000 | INHALATION_SPRAY | Freq: Two times a day (BID) | RESPIRATORY_TRACT | 5 refills | Status: DC

## 2019-06-01 NOTE — Chronic Care Management (AMB) (Signed)
  Chronic Care Management   Note  06/01/2019 Name: Jason Fisher MRN: [id number] DOB: December 27, 1940  Jason Fisher is a 79 y.o. year old male who is a primary care patient of [name], [name], MD. I reached out to [name] by phone today in response to a referral sent by Mr. Benajmin Dunham Biggs's PCP, [name], [name], MD.   Mr. Stenerson was given information about Chronic Care Management services today including:  1. CCM service includes personalized support from designated clinical staff supervised by his physician, including individualized plan of care and coordination with other care providers 2. 24/7 contact phone numbers for assistance for urgent and routine care needs. 3. Service will only be billed when office clinical staff spend 20 minutes or more in a month to coordinate care. 4. Only one practitioner may furnish and bill the service in a calendar month. 5. The patient may stop CCM services at any time (effective at the end of the month) by phone call to the office staff.   Patient agreed to services and verbal consent obtained.   Follow up plan:   SIGNATURE

## 2019-06-01 NOTE — Progress Notes (Signed)
Subjective:    Patient ID: Jason Fisher, male    DOB: [birth year], 79 y.o.   MRN: [id number]  HPI   ROV 07/26/17 --patient has a history of tobacco use and very severe COPD, pulmonary nodules by CT scan of the chest that resolved on serial imaging.  He also carries a history of coronary disease, carotid artery disease, atrial fibrillation, hypertension with diastolic dysfunction.  We have tried him in the past on Stiolto and Anoro without any benefit.  At his last visit I asked him to try bevespi to see if he prefers. He feels that it has helped him, improved his breathing. He has not needed much xopenex. Minimal cough or wheeze. He has been having some diarrhea intermittently. No flares, no pred or abx. His PNA vaccines are UTD  ROV 06/01/19 --[name] is a 79 year old man who follows up today for very severe COPD.  He also has a history of coronary disease, carotid disease, A. Fib on Eliquis, hypertension with diastolic dysfunction.  We have followed him in the past with serial CT scans for pulmonary nodular disease that resolved.  Last seen in 07/2017.  He reports today that he has been having increased exertional SOB, bothering him for about last 6 months. Frequent cough with clear sputum. Questionable response to BD's in the past but he reports some benefit from bevespi last time. He has xopenex and uses prn > 0-2x a day with exertion.  He stopped the Bevespi about 6 months ago because he was not sure whether was accomplishing anything.  No flares, no antibiotics, no hospitalizations  MDM: Reviewed Dr. Doug Sou cardiology note from 05/18/2019 Reviewed CT abdomen and pelvis 03/15/2017 Reviewed pulmonary function testing 01/22/2016   Review of Systems As per HPI      Objective:   Physical Exam Vitals:   06/01/19 1158  BP: (!) 142/80  Pulse: 72  Temp: 98 F (36.7 C)  TempSrc: Temporal  SpO2: 92%  Weight: 185 lb 6.4 oz (84.1 kg)  Height: 5\' 8"  (1.727 m)   Gen: Pleasant,  well-nourished, in no distress,  normal affect  ENT: No lesions,  mouth clear,  oropharynx clear, no postnasal drip  Neck: No JVD, no stridor  Lungs: No use of accessory muscles, clear without rales or rhonchi  Cardiovascular: RRR, heart sounds normal, no murmur or gallops, no peripheral edema  Musculoskeletal: No deformities, no cyanosis or clubbing  Neuro: alert, non focal  Skin: Warm, no lesions or rashes   11/06/15 -- CT chest  COMPARISON:  CT abdomen and pelvis including lung bases November 04, 2015; chest radiograph September 29, 2015  FINDINGS: Cardiovascular: Prominence in the ascending thoracic aorta is noted with a measured transverse diameter of 4.0 x 3.7 cm. The aorta is tortuous in the descending thoracic aorta without apparent aneurysmal dilatation. There is no thoracic aortic dissection. The visualized great vessels show moderate atherosclerotic calcification at the origins of the right common carotid and left subclavian artery. There is no appreciable pericardial thickening. There are multiple foci of coronary artery calcification. No evident pulmonary embolus.  Mediastinum/Nodes: Thyroid appears unremarkable. There are scattered subcentimeter mediastinal lymph nodes. There is a sub- carinal lymph node measuring 1.5 x 1.4 cm. There is a lymph node in the right hilum measuring 1.1 x 0.9 cm.  Lungs/Pleura: There is somewhat irregular opacity in the superior lingula medially with a nodular appearing area measuring 1.0 x 1.0 cm, best seen on axial slice 89 series 3. This finding is  also apparent on sagittal slice 82 series 5. On axial slice 0000000 series 3 and sagittal slice [id number] series 5, there is a nodular appearing opacity with subtle cavitation measuring 1.5 x 1.1 cm in the inferior lingula. There is a somewhat spiculated appearing lesion in the medial segment of the right lower lobe measuring 1.5 x 1.4 cm. This lesion is best seen on axial slice 123456 series 3.  There is a somewhat irregular nodular appearing opacity in the posterior segment of the left lower lobe measuring 1.2 x 1.0 cm, best seen on axial slice [id number] series 3. There is scarring in each lung base as well. There is a nodular opacity in the posterior segment right lower lobe abutting the pleura measuring 1.0 x 0.7 cm, best seen on axial slice [id number] series 3. There is lower lobe bronchiectatic change bilaterally, slightly more severe on the right than on the left. There is no appreciable pleural effusion. There is mild left base atelectatic change.  Upper Abdomen: In the visualized upper abdomen, there is atherosclerotic calcification in the aorta. Incomplete visualization of abdominal aortic aneurysm noted, described on recent CT abdomen. Liver contour is somewhat lobular suggesting a degree of underlying cirrhosis. Scattered liver granulomas noted. No new lesion identified in the upper abdomen compared to 2 days prior.  Musculoskeletal: There is degenerative change in the thoracic spine. No blastic or lytic bone lesions are evident. 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  IMPRESSION: Irregular nodular opacities in the lung parenchyma as summarized above. Subtle questionable cavitation in a lesion in the inferior lingula noted. Largest nodular opacity measures 1.5 x 1.4 cm. This appearance is concerning for multifocal neoplasm. In the appropriate clinical setting, septic emboli could present similarly. There are mildly prominent sub- carinal and right hilar lymph nodes which could be either of malignant or reactive etiology. Given concern for potential multifocal neoplasm and possible lymph node involvement, nuclear medicine PET study advised to further evaluate.      Assessment & Plan:  COPD (chronic obstructive pulmonary disease) (HCC) Progressive dyspnea over the last 6 months.  He stopped his Bevespi at that time as he was not sure that it was accomplishing  anything.  In retrospect I suspect that he was benefiting.  I did explain to him that this medication does not give immediate benefit but that it works if it is consistently in his system.  He is willing to retry the [place].  We will follow-up by phone in 6 to 8 weeks to see if he is recouped any benefit.  He will continue to keep leave albuterol available to use as needed.  Walking oximetry today to ensure that he does not have occult desaturation with exertion.  If he qualifies for oxygen then we will arrange this for him.  Baltazar Apo, MD, PhD 06/01/2019, 12:21 PM [facility] [phone] or if no answer [phone]

## 2019-06-01 NOTE — Addendum Note (Signed)
Addended by: Jannette Spanner on: 06/01/2019 12:39 PM   Modules accepted: Orders

## 2019-06-01 NOTE — Assessment & Plan Note (Signed)
Progressive dyspnea over the last 6 months.  He stopped his Bevespi at that time as he was not sure that it was accomplishing anything.  In retrospect I suspect that he was benefiting.  I did explain to him that this medication does not give immediate benefit but that it works if it is consistently in his system.  He is willing to retry the [place].  We will follow-up by phone in 6 to 8 weeks to see if he is recouped any benefit.  He will continue to keep leave albuterol available to use as needed.  Walking oximetry today to ensure that he does not have occult desaturation with exertion.  If he qualifies for oxygen then we will arrange this for him.

## 2019-06-01 NOTE — Patient Instructions (Signed)
We will restart Bevespi 2 puffs twice a day.  Take this every day on a schedule as a maintenance medication. Keep your leave albuterol (Xopenex) inhaler available to use 2 puffs up to every 6 hours if needed for shortness of breath, chest tightness, wheezing. Walking oximetry today on room air Follow with a phone visit with Dr. Lamonte Sakai in 6 to 8 weeks to talk about your progress on the [place].

## 2019-06-06 NOTE — Telephone Encounter (Signed)
Referral placed.

## 2019-06-06 NOTE — Addendum Note (Signed)
Addended by: Westley Hummer B on: 06/06/2019 07:48 AM   Modules accepted: Orders

## 2019-06-07 NOTE — Chronic Care Management (AMB) (Signed)
Chronic Care Management Pharmacy  Name: Jason Fisher  MRN: [id number] DOB: [birth year]  Chief Complaint/ HPI  [name],  79 y.o. , male presents for their Initial CCM visit with the clinical pharmacist via telephone due to COVID-19 Pandemic. His wife was with him during the phone call to assist him with his medications. Patient was very pleasant and is very active with church. He loves to you yard work 2-3 times a week and considers that as one of his main forms of exercise.  PCP : [name], [name], MD  Their chronic conditions include HTN, HLD, Diabetes type 2, COPD, Afib, CAD  Office Visits: 05/12/19 OV - Diabetes f/u. A1c inreased from 7.1 (12/20) to 7.8. Referral suggested, but patient wanted to increase in insulin and work on diet prior to initiating referral. Tresiba increased from 28 to 32 units at bedtime.   01/24/19 OV - Diabetes f/u. A1c improved to 7.1 from 7.6 (9/20) and 10.8 (3/20). Continue tresiba 28 U at bedtime.  Consult Visit: 06/01/19 OV ([place], Pulmonology) - COPD. Patient discontinued using Bevespi as he seems it was not accomplishing anything. Resumed taking Bevespi. F/u in 6-8 weeks.  05/18/19 OV ([place], Cardio) - Dyspnea on exertion. Might be pulmonary in nature instead. No changes with meds.   03/06/19 OV ([place], Card[facility]) - Afib. No changes with medications.  Medications: Outpatient Encounter Medications as of 06/08/2019  Medication Sig  . apixaban (ELIQUIS) 5 MG TABS tablet Take 1 tablet (5 mg total) by mouth 2 (two) times daily.  Marland Kitchen atorvastatin (LIPITOR) 40 MG tablet Take 1 tablet (40 mg total) by mouth daily.  . clopidogrel (PLAVIX) 75 MG tablet Take 1 tablet (75 mg total) by mouth daily.  Marland Kitchen diltiazem (CARDIZEM CD) 360 MG 24 hr capsule Take 1 capsule (360 mg total) by mouth daily.  . furosemide (LASIX) 80 MG tablet Take 1 tablet (80 mg total) by mouth daily.  Marland Kitchen glucose blood (ACCU-CHEK AVIVA PLUS) test strip CHECK BLOOD SUGAR ONCE DAILY.  Dx  E11.51  . Glycopyrrolate-Formoterol (BEVESPI AEROSPHERE) 9-4.8 MCG/ACT AERO Inhale 2 puffs into the lungs 2 (two) times daily.  . hydrALAZINE (APRESOLINE) 25 MG tablet Take 1 tablet (25 mg total) by mouth 3 (three) times daily.  . insulin degludec (TRESIBA FLEXTOUCH) 100 UNIT/ML FlexTouch Pen Inject 0.32 mLs (32 Units total) into the skin at bedtime.  . levalbuterol (XOPENEX HFA) 45 MCG/ACT inhaler Inhale 1-2 puffs into the lungs every 6 (six) hours as needed for wheezing.  . metoprolol tartrate (LOPRESSOR) 100 MG tablet Take 1 tablet (100 mg total) by mouth 2 (two) times daily.  . nitroGLYCERIN (NITROSTAT) 0.4 MG SL tablet Place 1 tablet (0.4 mg total) under the tongue every 5 (five) minutes as needed for chest pain. X 3 doses  . polyethylene glycol (MIRALAX / GLYCOLAX) packet Take 17 g by mouth daily as needed for mild constipation.  . metFORMIN (GLUCOPHAGE) 1000 MG tablet TAKE 1 TABLET TWICE DAILY WITH A MEAL   No facility-administered encounter medications on file as of 06/08/2019.     Financial Resource Strain: Low Risk   . Difficulty of Paying Living Expenses: Not hard at all     Physical Activity: Sufficiently Active  . Days of Exercise per Week: 3 days  . Minutes of Exercise per Session: 60 min    Current Diagnosis/Assessment:  Goals Addressed            This Visit's Progress   . Patient Stated  CARE PLAN ENTRY  Current Barriers:  . Chronic Disease Management support, education, and care coordination needs related to Hypertension and Diabetes  Pharmacist Clinical Goal(s):  Marland Kitchen Over the next 90 days, patient will work with pharmacist to achieve blood pressure goal of less than 140/90 and A1c less than 7.0  Interventions: . Comprehensive medication review performed. . Educated patient with proper timing of medication and recommended diet low in carbohydrates and sugar  Patient Self Care Activities:  . Patient verbalizes understanding of plan as described above,  Calls pharmacy for medication refills, and Calls provider office for new concerns or questions . Over the next 90 days, patient will monitor fasting blood sugar daily in the morning . Over the next 90 days, patient will monitor blood pressure once daily every morning  Initial goal documentation       Hypertension   Office blood pressures are  BP Readings from Last 3 Encounters:  06/01/19 (!) 142/80  05/18/19 (!) 142/100  05/12/19 124/80   Patient has failed these meds in the past: chlortalidone, ramipril Patient is currently controlled on the following medications:   Metoprolol 100 mg 1 tablet BID AM and PM  Diltiazem 360 mg 360 mg 1 tablet AM  Hydralazine 25 mg 1 tablet TID AM, NN, PM  Nitrostat 0.4 mg SL 1 tablet every 5 mins PRN for chest pain   We discussed the importance of routinely checking blood pressure. I told him to at least check his blood pressure once a day and he confirmed that he's been doing that. He hasn't had any chest pains in a long time, but still has nitrostat handy just in case. Patient has been doing yard work 2-3 times a week as a form of physical activity.  Plan  Continue current medications and control with diet and exercise    AFIB   Patient has failed these meds in past: None Patient is currently controlled on the following medications:  Diltiazem 360 mg 1 tablet daily  Metoprolol 100 mg 1 tablet BID  Eliquis 5 mg 1 tablet BID AM and PM  We discussed the importance of taking it slow during physical activity. Patient has been doing yard work and denies any shortness of breath or heart rate. Patient reported adherence taking his medications. He was with her wife during the phone visit.  Plan  Continue current medications and control with diet and exercise   Heart Failure   Last ejection fraction: LV EF 60-65% (11/05/17)  Patient has failed these meds in past: None Patient is currently controlled on the following medications:    Metoprolol 100 mg 1 tablet BID  Furosemide 80 mg 1 tablet daily PRN  Patient denies having weight gain in a while and has been using furosemide as needed. Patient has been experiencing shortness of breath from increased physical activity, but he said that it has been more of the lungs and is resolved when using his maintenance inhaler.  Plan  Continue current medications  CAD   Patient has failed these meds in past: None Patient is currently controlled on the following medications:   Clopidogrel 75 mg 1 tablet daily  Metoprolol tartrate 100 mg 1 tablet BID  Atorvastatin 40 mg 1 tablet daily  Patient reports adherence with his medications. His lipid panel is superb and WNL. Encouraged him to keep eating healthy and do routine exercise too. Denies any shortness of breath on exertion. Discussed taking breaks in between activities.  Plan  Continue current medications ,  COPD / Asthma / Tobacco   Eosinophil count:   Lab Results  Component Value Date/Time   EOSPCT 2.2 04/30/2016 12:33 PM  %                               Eos (Absolute):  Lab Results  Component Value Date/Time   EOSABS 0.3 03/06/2019 02:04 PM    Tobacco Status:  Social History   Tobacco Use  Smoking Status Former Smoker  . Packs/day: 1.00  . Years: 49.00  . Pack years: 49.00  . Types: Cigarettes  . Quit date: 09/08/2015  . Years since quitting: 3.7  Smokeless Tobacco Never Used  Tobacco Comment   passive smoker as well    Patient has failed these meds in past: None Patient is currently controlled on the following medications:   Bevespi Aerosphere 9.4.8 mg/act 2 puffs BID  Xopenex 45 mcg/act inhaler 1-2 puffs every 6 hrs as need for wheezing Using maintenance inhaler regularly? Yes Frequency of rescue inhaler use:  1-2x per week  We discussed the importance of using bevespi twice daily. He mentioned that he stopped using it for awhile and his breathing got worse. He reported that since the  day he resumed taking it, his breathing got better and has not been taking the xopenex as much (1-2x week)  Plan  Continue current medications   Dyslipidemia   Lipid Panel     Component Value Date/Time   CHOL 121 03/06/2019 1404   TRIG 88 03/06/2019 1404   HDL 43 03/06/2019 1404   CHOLHDL 2.8 03/06/2019 1404   CHOLHDL 2.9 09/25/2015 0221   VLDL 13 09/25/2015 0221   LDLCALC 61 03/06/2019 1404   LABVLDL 17 03/06/2019 1404     The ASCVD Risk score (Goff DC Jr., et al., 2013) failed to calculate for the following reasons:   The patient has a prior MI or stroke diagnosis   Patient has failed these meds in past: None Patient is currently controlled on the following medications:   Atorvastatin 40 mg 1 tablet at bedtime  His lipid panel is perfect and I encouraged him to keep it up. Has been good with taking atorvastatin and denies any muscle pains or side effects associated with it. Discussed diet and avoid food that is high on cholesterol.   Plan  Continue current medications and control with diet and exercise   Diabetes   Recent Relevant Labs: Lab Results  Component Value Date/Time   HGBA1C 7.8 (A) 05/12/2019 10:42 AM   HGBA1C 7.6 (H) 03/06/2019 02:04 PM   HGBA1C 7.1 (A) 01/24/2019 09:16 AM   HGBA1C 9.5 (H) 12/21/2017 10:03 AM   GFR 54.45 (L) 12/21/2017 10:03 AM   GFR 61.30 05/24/2017 11:34 AM   MICROALBUR 3.7 (H) 08/30/2015 08:21 AM   MICROALBUR 2.1 (H) 04/22/2015 11:12 AM     Checking BG: Daily in the morning (fasting)  Recent FBG Readings: 120 1 time/day  Rice at home (white) once or twice a month, bread whole wheat, barbeque. 6-7 lbs. Walking yard work 2x week  Patient has failed these meds in past:  Patient is currently controlled on the following medications:   Tresiba Flextouch 100 units/mL - 32 units at bedtime  Metformin 1000 mg 1 tablet BID with meals AM and PM  Last diabetic Eye exam:  Lab Results  Component Value Date/Time   HMDIABEYEEXA  Retinopathy (A) 03/22/2017 12:27 PM    Last diabetic  Foot exam:  Lab Results  Component Value Date/Time   HMDIABFOOTEX done 12/10/2011 12:00 AM    Discussed with the patient about minimizing food rich in carbs. Patient eats white rice, but only 2-3 times a month, and eats whole wheat bread. Encouraged the patient to increase physical activity which he said that he does yard work 2-3 times a week.  Plan  Continue current medications and control with diet and exercise  OTCs/Miscellaneous    Miralax 1 packet as needed for mild constipation  Medication Management   Pharmacy/benefits: [facility], [place], [place] / [place] Patient has her wife to help him out with organizing his medications.  Follow up: 3 month phone visit (09/06/19 at 1PM)   Geraldine Contras, PharmD Clinical Pharmacist [facility] [place] [phone]

## 2019-06-08 ENCOUNTER — Other Ambulatory Visit: Payer: Self-pay

## 2019-06-08 ENCOUNTER — Ambulatory Visit: Payer: Medicare Other | Admitting: Pharmacist

## 2019-06-08 DIAGNOSIS — I10 Essential (primary) hypertension: Secondary | ICD-10-CM

## 2019-06-08 DIAGNOSIS — E1151 Type 2 diabetes mellitus with diabetic peripheral angiopathy without gangrene: Secondary | ICD-10-CM

## 2019-06-08 NOTE — Patient Instructions (Addendum)
Visit Information  Thank you for meeting with me to discuss your medications, Mr.! I look forward to working with you to achieve your health care goals. Below is a summary of what we talked about during the visit:  Goals Addressed            This Visit's Progress   . Patient Stated       CARE PLAN ENTRY  Current Barriers:  . Chronic Disease Management support, education, and care coordination needs related to Hypertension and Diabetes  Pharmacist Clinical Goal(s):  Marland Kitchen Over the next 90 days, patient will work with pharmacist to achieve blood pressure goal of less than 140/90 and A1c less than 7.0  Interventions: . Comprehensive medication review performed. . Educated patient with proper timing of medication and recommended diet low in carbohydrates and sugar  Patient Self Care Activities:  . Patient verbalizes understanding of plan as described above, Calls pharmacy for medication refills, and Calls provider office for new concerns or questions . Over the next 90 days, patient will monitor fasting blood sugar daily in the morning . Over the next 90 days, patient will monitor blood pressure once daily every morning  Initial goal documentation        Mr. Challis was given information about Chronic Care Management services today including:  1. CCM service includes personalized support from designated clinical staff supervised by his physician, including individualized plan of care and coordination with other care providers 2. 24/7 contact phone numbers for assistance for urgent and routine care needs. 3. Standard insurance, coinsurance, copays and deductibles apply for chronic care management only during months in which we provide at least 20 minutes of these services. Most insurances cover these services at 100%, however patients may be responsible for any copay, coinsurance and/or deductible if applicable. This service may help you avoid the need for more expensive face-to-face  services. 4. Only one practitioner may furnish and bill the service in a calendar month. 5. The patient may stop CCM services at any time (effective at the end of the month) by phone call to the office staff.  Patient agreed to services and verbal consent obtained.   The patient verbalized understanding of instructions provided today and agreed to receive a mailed copy of patient instruction and/or educational materials. Telephone follow up appointment with pharmacy team member scheduled for: 09/06/19 at [place], PharmD Clinical Pharmacist [facility] [phone]   Diabetes Mellitus and Nutrition, Adult When you have diabetes (diabetes mellitus), it is very important to have healthy eating habits because your blood sugar (glucose) levels are greatly affected by what you eat and drink. Eating healthy foods in the appropriate amounts, at about the same times every day, can help you:  Control your blood glucose.  Lower your risk of heart disease.  Improve your blood pressure.  Reach or maintain a healthy weight. Every person with diabetes is different, and each person has different needs for a meal plan. Your health care provider may recommend that you work with a diet and nutrition specialist (dietitian) to make a meal plan that is best for you. Your meal plan may vary depending on factors such as:  The calories you need.  The medicines you take.  Your weight.  Your blood glucose, blood pressure, and cholesterol levels.  Your activity level.  Other health conditions you have, such as heart or kidney disease. How do carbohydrates affect me? Carbohydrates, also called carbs, affect your blood glucose  level more than any other type of food. Eating carbs naturally raises the amount of glucose in your blood. Carb counting is a method for keeping track of how many carbs you eat. Counting carbs is important to keep your blood glucose at a healthy level,  especially if you use insulin or take certain oral diabetes medicines. It is important to know how many carbs you can safely have in each meal. This is different for every person. Your dietitian can help you calculate how many carbs you should have at each meal and for each snack. Foods that contain carbs include:  Bread, cereal, rice, pasta, and crackers.  Potatoes and corn.  Peas, beans, and lentils.  Milk and yogurt.  Fruit and juice.  Desserts, such as cakes, cookies, ice cream, and candy. How does alcohol affect me? Alcohol can cause a sudden decrease in blood glucose (hypoglycemia), especially if you use insulin or take certain oral diabetes medicines. Hypoglycemia can be a life-threatening condition. Symptoms of hypoglycemia (sleepiness, dizziness, and confusion) are similar to symptoms of having too much alcohol. If your health care provider says that alcohol is safe for you, follow these guidelines:  Limit alcohol intake to no more than 1 drink per day for nonpregnant women and 2 drinks per day for men. One drink equals 12 oz of beer, 5 oz of wine, or 1 oz of hard liquor.  Do not drink on an empty stomach.  Keep yourself hydrated with water, diet soda, or unsweetened iced tea.  Keep in mind that regular soda, juice, and other mixers may contain a lot of sugar and must be counted as carbs. What are tips for following this plan?  Reading food labels  Start by checking the serving size on the "Nutrition Facts" label of packaged foods and drinks. The amount of calories, carbs, fats, and other nutrients listed on the label is based on one serving of the item. Many items contain more than one serving per package.  Check the total grams (g) of carbs in one serving. You can calculate the number of servings of carbs in one serving by dividing the total carbs by 15. For example, if a food has 30 g of total carbs, it would be equal to 2 servings of carbs.  Check the number of grams  (g) of saturated and trans fats in one serving. Choose foods that have low or no amount of these fats.  Check the number of milligrams (mg) of salt (sodium) in one serving. Most people should limit total sodium intake to less than 2,300 mg per day.  Always check the nutrition information of foods labeled as "low-fat" or "nonfat". These foods may be higher in added sugar or refined carbs and should be avoided.  Talk to your dietitian to identify your daily goals for nutrients listed on the label. Shopping  Avoid buying canned, premade, or processed foods. These foods tend to be high in fat, sodium, and added sugar.  Shop around the outside edge of the grocery store. This includes fresh fruits and vegetables, bulk grains, fresh meats, and fresh dairy. Cooking  Use low-heat cooking methods, such as baking, instead of high-heat cooking methods like deep frying.  Cook using healthy oils, such as olive, canola, or sunflower oil.  Avoid cooking with butter, cream, or high-fat meats. Meal planning  Eat meals and snacks regularly, preferably at the same times every day. Avoid going long periods of time without eating.  Eat foods high in fiber, such  as fresh fruits, vegetables, beans, and whole grains. Talk to your dietitian about how many servings of carbs you can eat at each meal.  Eat 4-6 ounces (oz) of lean protein each day, such as lean meat, chicken, fish, eggs, or tofu. One oz of lean protein is equal to: ? 1 oz of meat, chicken, or fish. ? 1 egg. ?  cup of tofu.  Eat some foods each day that contain healthy fats, such as avocado, nuts, seeds, and fish. Lifestyle  Check your blood glucose regularly.  Exercise regularly as told by your health care provider. This may include: ? 150 minutes of moderate-intensity or vigorous-intensity exercise each week. This could be brisk walking, biking, or water aerobics. ? Stretching and doing strength exercises, such as yoga or weightlifting, at  least 2 times a week.  Take medicines as told by your health care provider.  Do not use any products that contain nicotine or tobacco, such as cigarettes and e-cigarettes. If you need help quitting, ask your health care provider.  Work with a Social worker or diabetes educator to identify strategies to manage stress and any emotional and social challenges. Questions to ask a health care provider  Do I need to meet with a diabetes educator?  Do I need to meet with a dietitian?  What number can I call if I have questions?  When are the best times to check my blood glucose? Where to find more information:  American Diabetes Association: diabetes.org  Academy of Nutrition and Dietetics: www.eatright.[employer] of Diabetes and Digestive and Kidney Diseases (NIH): [employer] Summary  A healthy meal plan will help you control your blood glucose and maintain a healthy lifestyle.  Working with a diet and nutrition specialist (dietitian) can help you make a meal plan that is best for you.  Keep in mind that carbohydrates (carbs) and alcohol have immediate effects on your blood glucose levels. It is important to count carbs and to use alcohol carefully. This information is not intended to replace advice given to you by your health care provider. Make sure you discuss any questions you have with your health care provider. Document Revised: 01/01/2017 Document Reviewed: 02/24/2016 Elsevier Patient Education  2020 [employer].

## 2019-06-29 ENCOUNTER — Other Ambulatory Visit: Payer: Self-pay | Admitting: Internal Medicine

## 2019-06-29 DIAGNOSIS — E1151 Type 2 diabetes mellitus with diabetic peripheral angiopathy without gangrene: Secondary | ICD-10-CM

## 2019-07-13 ENCOUNTER — Encounter: Payer: Self-pay | Admitting: Emergency Medicine

## 2019-07-13 ENCOUNTER — Ambulatory Visit (INDEPENDENT_AMBULATORY_CARE_PROVIDER_SITE_OTHER): Payer: Medicare Other | Admitting: Emergency Medicine

## 2019-07-13 ENCOUNTER — Other Ambulatory Visit: Payer: Self-pay

## 2019-07-13 DIAGNOSIS — F17200 Nicotine dependence, unspecified, uncomplicated: Secondary | ICD-10-CM

## 2019-07-13 DIAGNOSIS — R918 Other nonspecific abnormal finding of lung field: Secondary | ICD-10-CM | POA: Diagnosis not present

## 2019-07-13 DIAGNOSIS — J42 Unspecified chronic bronchitis: Secondary | ICD-10-CM | POA: Diagnosis not present

## 2019-07-13 MED ORDER — BEVESPI AEROSPHERE 9-4.8 MCG/ACT IN AERO: 2.0000 | INHALATION_SPRAY | Freq: Two times a day (BID) | RESPIRATORY_TRACT | 5 refills | Status: DC

## 2019-07-13 NOTE — Assessment & Plan Note (Signed)
Resolved on prior CT

## 2019-07-13 NOTE — Assessment & Plan Note (Signed)
Seems to have benefited from the [place], less dyspnea.  Still limited and still requires Xopenex.  Overall it seems that the [place] has helped and we will plan to continue.   We will continue Bevespi 2 puffs twice a day. Keep your Xopenex available use 2 puffs when you need it for shortness of breath, chest tightness, wheezing. COVID-19 vaccine, flu shot and pneumonia vaccine are all up-to-date Follow with Dr Lamonte Sakai in 6 months or sooner if you have any problems

## 2019-07-13 NOTE — Progress Notes (Signed)
Subjective:    Patient ID: Jason Fisher, male    DOB: 1940-10-05, 79 y.o.   MRN: [id number]  HPI   ROV 07/13/19 --79 year old gentleman with a history of very severe COPD, remote nodular disease (resolved on CT scan).  I saw him about 6 weeks ago with progressive dyspnea, cough with clear sputum.  We decided to retry Bevespi to see if he would get benefit. He reports that he probably has benefited some - his breathing has improved some especially at night. Activity is about the same. He is wheezing less. Still has cough, clear mucous. Using xopenex - not every day, about the same amount.  I had planned for a walking oximetry last time but no data recorded >> he was not started on O2.  COVID vaccine up to date, flu shot up to date  Will still smoke occasionally >> 1 cig every few days.   Review of Systems As per HPI      Objective:   Physical Exam Vitals:   07/13/19 1152  BP: (!) 120/58  Pulse: 90  Temp: 98 F (36.7 C)  TempSrc: Oral  SpO2: 96%  Weight: 181 lb 6.4 oz (82.3 kg)  Height: 5\' 10"  (1.778 m)   Gen: Pleasant, well-nourished, in no distress,  normal affect  ENT: No lesions,  mouth clear,  oropharynx clear, no postnasal drip  Neck: No JVD, no stridor  Lungs: No use of accessory muscles, distant, clear without rales or rhonchi  Cardiovascular: RRR, heart sounds normal, no murmur or gallops, no peripheral edema  Musculoskeletal: No deformities, no cyanosis or clubbing  Neuro: alert, non focal  Skin: Warm, no lesions or rashes   11/06/15 -- CT chest  COMPARISON:  CT abdomen and pelvis including lung bases November 04, 2015; chest radiograph September 29, 2015  FINDINGS: Cardiovascular: Prominence in the ascending thoracic aorta is noted with a measured transverse diameter of 4.0 x 3.7 cm. The aorta is tortuous in the descending thoracic aorta without apparent aneurysmal dilatation. There is no thoracic aortic dissection. The visualized great vessels show  moderate atherosclerotic calcification at the origins of the right common carotid and left subclavian artery. There is no appreciable pericardial thickening. There are multiple foci of coronary artery calcification. No evident pulmonary embolus.  Mediastinum/Nodes: Thyroid appears unremarkable. There are scattered subcentimeter mediastinal lymph nodes. There is a sub- carinal lymph node measuring 1.5 x 1.4 cm. There is a lymph node in the right hilum measuring 1.1 x 0.9 cm.  Lungs/Pleura: There is somewhat irregular opacity in the superior lingula medially with a nodular appearing area measuring 1.0 x 1.0 cm, best seen on axial slice 89 series 3. This finding is also apparent on sagittal slice 82 series 5. On axial slice 644 series 3 and sagittal slice 034 series 5, there is a nodular appearing opacity with subtle cavitation measuring 1.5 x 1.1 cm in the inferior lingula. There is a somewhat spiculated appearing lesion in the medial segment of the right lower lobe measuring 1.5 x 1.4 cm. This lesion is best seen on axial slice 742 series 3. There is a somewhat irregular nodular appearing opacity in the posterior segment of the left lower lobe measuring 1.2 x 1.0 cm, best seen on axial slice 595 series 3. There is scarring in each lung base as well. There is a nodular opacity in the posterior segment right lower lobe abutting the pleura measuring 1.0 x 0.7 cm, best seen on axial slice 638 series 3. There  is lower lobe bronchiectatic change bilaterally, slightly more severe on the right than on the left. There is no appreciable pleural effusion. There is mild left base atelectatic change.  Upper Abdomen: In the visualized upper abdomen, there is atherosclerotic calcification in the aorta. Incomplete visualization of abdominal aortic aneurysm noted, described on recent CT abdomen. Liver contour is somewhat lobular suggesting a degree of underlying cirrhosis. Scattered liver  granulomas noted. No new lesion identified in the upper abdomen compared to 2 days prior.  Musculoskeletal: There is degenerative change in the thoracic spine. No blastic or lytic bone lesions are evident. 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  IMPRESSION: Irregular nodular opacities in the lung parenchyma as summarized above. Subtle questionable cavitation in a lesion in the inferior lingula noted. Largest nodular opacity measures 1.5 x 1.4 cm. This appearance is concerning for multifocal neoplasm. In the appropriate clinical setting, septic emboli could present similarly. There are mildly prominent sub- carinal and right hilar lymph nodes which could be either of malignant or reactive etiology. Given concern for potential multifocal neoplasm and possible lymph node involvement, nuclear medicine PET study advised to further evaluate.      Assessment & Plan:  COPD (chronic obstructive pulmonary disease) ([place]) Seems to have benefited from the [place], less dyspnea.  Still limited and still requires Xopenex.  Overall it seems that the [place] has helped and we will plan to continue.   We will continue Bevespi 2 puffs twice a day. Keep your Xopenex available use 2 puffs when you need it for shortness of breath, chest tightness, wheezing. COVID-19 vaccine, flu shot and pneumonia vaccine are all up-to-date Follow with Dr Lamonte Sakai in 6 months or sooner if you have any problems  Pulmonary nodules Resolved on prior CT  TOBACCO USER He reports that he still smokes 1 cigarette every few days.  Encouraged him to try to stop altogether.  Baltazar Apo, MD, PhD 07/13/2019, 12:12 PM [facility] [phone] or if no answer [phone]

## 2019-07-13 NOTE — Patient Instructions (Addendum)
We will continue Bevespi 2 puffs twice a day. Keep your Xopenex available use 2 puffs when you need it for shortness of breath, chest tightness, wheezing. Work hard on stopping smoking altogether. COVID-19 vaccine, flu shot and pneumonia vaccine are all up-to-date Follow with Dr Lamonte Sakai in 6 months or sooner if you have any problems

## 2019-07-13 NOTE — Assessment & Plan Note (Signed)
He reports that he still smokes 1 cigarette every few days.  Encouraged him to try to stop altogether.

## 2019-07-13 NOTE — Addendum Note (Signed)
Addended by: Gavin Potters R on: 07/13/2019 12:35 PM   Modules accepted: Orders

## 2019-08-11 ENCOUNTER — Other Ambulatory Visit: Payer: Self-pay

## 2019-08-11 ENCOUNTER — Ambulatory Visit (INDEPENDENT_AMBULATORY_CARE_PROVIDER_SITE_OTHER): Payer: Medicare Other | Admitting: Internal Medicine

## 2019-08-11 ENCOUNTER — Encounter: Payer: Self-pay | Admitting: Internal Medicine

## 2019-08-11 VITALS — BP 130/80 | HR 77 | Temp 98.1°F | Ht 70.0 in | Wt 181.7 lb

## 2019-08-11 DIAGNOSIS — E1151 Type 2 diabetes mellitus with diabetic peripheral angiopathy without gangrene: Secondary | ICD-10-CM | POA: Diagnosis not present

## 2019-08-11 DIAGNOSIS — I5032 Chronic diastolic (congestive) heart failure: Secondary | ICD-10-CM

## 2019-08-11 DIAGNOSIS — E785 Hyperlipidemia, unspecified: Secondary | ICD-10-CM | POA: Diagnosis not present

## 2019-08-11 DIAGNOSIS — I482 Chronic atrial fibrillation, unspecified: Secondary | ICD-10-CM

## 2019-08-11 DIAGNOSIS — N1832 Chronic kidney disease, stage 3b: Secondary | ICD-10-CM | POA: Diagnosis not present

## 2019-08-11 DIAGNOSIS — I10 Essential (primary) hypertension: Secondary | ICD-10-CM

## 2019-08-11 LAB — POCT GLYCOSYLATED HEMOGLOBIN (HGB A1C): Hemoglobin A1C: 6.8 % — AB (ref 4.0–5.6)

## 2019-08-11 NOTE — Patient Instructions (Signed)
-  Nice seeing you today!!  -Schedule follow up in 3 months. 

## 2019-08-11 NOTE — Progress Notes (Signed)
Established Patient Office Visit     This visit occurred during the SARS-CoV-2 public health emergency.  Safety protocols were in place, including screening questions prior to the visit, additional usage of staff PPE, and extensive cleaning of exam room while observing appropriate contact time as indicated for disinfecting solutions.    CC/Reason for Visit: 12-month follow-up chronic medical conditions  HPI: Jason Fisher is a 79 y.o. male who is coming in today for the above mentioned reasons. Past Medical History is significant for: Chronic diastolic heart failure, benign essential hypertension,AAA,type 2 diabetes, chronic atrial fibrillation, chronic kidney disease stage III.He sees me mainly for diabetic care. We have been working on insulin titration and getting A1c down. There were some comprehension barriers initially. We had a HHRN reach out to him. He is doing better. Unfortunately, he does not bring his CBG log in today as requested.  At last visit we increased his [place] to 32 units.  He has been compliant.  He states he has been eating healthier, his weight has been stable.  He has seen his cardiologist and no medications were changed.  He states he has not picked up the Eliquis this month as the co-pay was over $500.  I have told him that there is a higher stroke risk while off Eliquis.  I have advised him to contact his cardiologist today.  No acute complaints today.   Past Medical/Surgical History: Past Medical History:  Diagnosis Date  . 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 BENIGN PROSTATIC HYPERTROPHY, HX OF 02/24/2007  . CAD S/P PCI    a. 2007 Inf STEMI s/p Vision BMS  2 to RCA;  b. 2009 ISR Prox RCA Stent-->with DES;  c. 09/2015 PCI: LM nl, LAD 30p/m, D1 90, LCX nl, OM1 small, 80, OM2 90 (2.5x14 Biofreedom stent), OM3 small, RCA 30p ISR, 65m ISR, 33m, 85d, EF 50-55%.  . 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 Chronic atrial fibrillation ([place]) 11/06/2009   a. On  Eliquis (CHA2DS2VASc = 5).  Rate controlled w/ BB and CCB.  Marland Kitchen Chronic diastolic CHF (congestive heart failure) ([place])    a. 09/2015 Echo: EF 55-60%, mildly dil LA.  . CKD (chronic kidney disease), stage III    they mentioned this to the patient but they would work on it later after the aneurysm  . COLONIC POLYPS, HX OF 11/02/2006  . COPD (chronic obstructive pulmonary disease) ([place])   . DIABETES MELLITUS, TYPE II 10/29/2006  . Diverticulosis   . History of tobacco abuse    a. Quit 09/2015.  Marland Kitchen HYPERLIPIDEMIA 10/29/2006  . Hypertensive heart disease with heart failure ([place])   . Pneumonia 09/2015   [name] 07/31/2016  . STEMI (ST elevation myocardial infarction) [facility]) 2007   [name] 07/31/2016    Past Surgical History:  Procedure Laterality Date  . ABDOMINAL AORTIC ENDOVASCULAR STENT GRAFT N/A 07/31/2016   Procedure: ABDOMINAL AORTIC ENDOVASCULAR STENT GRAFT;  Surgeon: Serafina Mitchell, MD;  Location: [place];  Service: Vascular;  Laterality: N/A;  . CARDIAC CATHETERIZATION N/A 09/26/2015   Procedure: Left Heart Cath and Coronary Angiography;  Surgeon: Peter M Martinique, MD;  Location: [place];  Service: Cardiovascular;  Laterality: N/A;  . CARDIAC CATHETERIZATION N/A 09/26/2015   Procedure: Coronary Stent Intervention;  Surgeon: Peter M Martinique, MD;  Location: [place] [facility];  Service: Cardiovascular;  Laterality: N/A;  . CATARACT EXTRACTION Bilateral   . COLONOSCOPY    . CORONARY ANGIOPLASTY WITH  STENT PLACEMENT  2007   Inferior STEMI 2007 - RCA PCI with 2 Vision BMS; Abnormal Myoview in 2009 - pRCA ins-stent restenosis & unstented disease - DES PCI Promus 3.0 x 20 mm (3.5 mm)  . ENDOVASCULAR REPAIR/STENT GRAFT  07/31/2016  . INCISION AND DRAINAGE PERIRECTAL ABSCESS N/A 11/06/2015   Procedure: IRRIGATION AND DEBRIDEMENT PERIRECTAL ABSCESS, POSSIBLE HEMORRHOIDECTOMY;  Surgeon: Coralie Keens, MD;  Location: [place];  Service: General;  Laterality: N/A;  . ORIF SHOULDER FRACTURE Right     "fell out of back end of a truck"    Social History:  reports that he has been smoking cigarettes. He has a 49.00 pack-year smoking history. He has never used smokeless tobacco. He reports that he does not drink alcohol and does not use drugs.  Allergies: Allergies  Allergen Reactions  . Contrast Media [Iodinated Diagnostic Agents] Anaphylaxis    Family History:  Family History  Problem Relation Age of Onset  . Heart attack Mother   . Diabetes Mother   . Heart attack Father   . Hypertension Father   . Heart attack Brother   . Diabetes Brother      Current Outpatient Medications:  .  ACCU-CHEK AVIVA PLUS test strip, CHECK BLOOD SUGAR ONCE DAILY. DX E11.51, Disp: 100 strip, Rfl: 2 .  apixaban (ELIQUIS) 5 MG TABS tablet, Take 1 tablet (5 mg total) by mouth 2 (two) times daily., Disp: 60 tablet, Rfl: 4 .  atorvastatin (LIPITOR) 40 MG tablet, Take 1 tablet (40 mg total) by mouth daily., Disp: 90 tablet, Rfl: 3 .  clopidogrel (PLAVIX) 75 MG tablet, Take 1 tablet (75 mg total) by mouth daily., Disp: 90 tablet, Rfl: 3 .  diltiazem (CARDIZEM CD) 360 MG 24 hr capsule, Take 1 capsule (360 mg total) by mouth daily., Disp: 90 capsule, Rfl: 4 .  furosemide (LASIX) 80 MG tablet, Take 1 tablet (80 mg total) by mouth daily., Disp: 180 tablet, Rfl: 1 .  Glycopyrrolate-Formoterol (BEVESPI AEROSPHERE) 9-4.8 MCG/ACT AERO, Inhale 2 puffs into the lungs 2 (two) times daily., Disp: 5.9 g, Rfl: 5 .  hydrALAZINE (APRESOLINE) 25 MG tablet, Take 1 tablet (25 mg total) by mouth 3 (three) times daily., Disp: 270 tablet, Rfl: 1 .  insulin degludec (TRESIBA FLEXTOUCH) 100 UNIT/ML FlexTouch Pen, Inject 0.32 mLs (32 Units total) into the skin at bedtime., Disp: 15 pen, Rfl: 2 .  levalbuterol (XOPENEX HFA) 45 MCG/ACT inhaler, Inhale 1-2 puffs into the lungs every 6 (six) hours as needed for wheezing., Disp: 1 Inhaler, Rfl: 3 .  metFORMIN (GLUCOPHAGE) 1000 MG tablet, TAKE 1 TABLET TWICE DAILY WITH A MEAL, Disp: 180  tablet, Rfl: 4 .  metoprolol tartrate (LOPRESSOR) 100 MG tablet, Take 1 tablet (100 mg total) by mouth 2 (two) times daily., Disp: 180 tablet, Rfl: 4 .  nitroGLYCERIN (NITROSTAT) 0.4 MG SL tablet, Place 1 tablet (0.4 mg total) under the tongue every 5 (five) minutes as needed for chest pain. X 3 doses, Disp: 25 tablet, Rfl: 4 .  polyethylene glycol (MIRALAX / GLYCOLAX) packet, Take 17 g by mouth daily as needed for mild constipation., Disp: , Rfl:   Review of Systems:  Constitutional: Denies fever, chills, diaphoresis, appetite change and fatigue.  HEENT: Denies photophobia, eye pain, redness, hearing loss, ear pain, congestion, sore throat, rhinorrhea, sneezing, mouth sores, trouble swallowing, neck pain, neck stiffness and tinnitus.   Respiratory: Denies SOB, DOE, cough, chest tightness,  and wheezing.   Cardiovascular: Denies chest pain, palpitations and leg swelling.  Gastrointestinal: Denies nausea, vomiting, abdominal pain, diarrhea, constipation, blood in stool and abdominal distention.  Genitourinary: Denies dysuria, urgency, frequency, hematuria, flank pain and difficulty urinating.  Endocrine: Denies: hot or cold intolerance, sweats, changes in hair or nails, polyuria, polydipsia. Musculoskeletal: Denies myalgias, back pain, joint swelling, arthralgias and gait problem.  Skin: Denies pallor, rash and wound.  Neurological: Denies dizziness, seizures, syncope, weakness, light-headedness, numbness and headaches.  Hematological: Denies adenopathy. Easy bruising, personal or family bleeding history  Psychiatric/Behavioral: Denies suicidal ideation, mood changes, confusion, nervousness, sleep disturbance and agitation    Physical Exam: Vitals:   08/11/19 1120  BP: 130/80  Pulse: 77  Temp: 98.1 F (36.7 C)  TempSrc: Temporal  SpO2: 97%  Weight: 181 lb 11.2 oz (82.4 kg)  Height: 5\' 10"  (1.778 m)    Body mass index is 26.07 kg/m.   Constitutional: NAD, calm, comfortable Eyes:  PERRL, lids and conjunctivae normal, wears corrective lenses ENMT: Mucous membranes are moist.  Respiratory: clear to auscultation bilaterally, no wheezing, no crackles. Normal respiratory effort. No accessory muscle use.  Cardiovascular: Regular rate and rhythm, no murmurs / rubs / gallops. No extremity edema.  Neurologic: Grossly intact and nonfocal Psychiatric: Normal judgment and insight. Alert and oriented x 3. Normal mood.    Impression and Plan:  DM (diabetes mellitus), type 2 with peripheral vascular complications ([place])  -His A1c is finally at goal at 6.8, continue [place] 32 units.  Stage 3b chronic kidney disease -With baseline creatinine around 1.3-1.4.  Chronic diastolic congestive heart failure ([place]) -Compensated, followed by cardiology  Chronic atrial fibrillation ([place]) -With difficulty affording Eliquis, have advised him to contact his cardiologist's office. -Rate controlled.  Dyslipidemia -Last LDL was 94 in 2019.  Not at goal.  Consider increasing Lipitor dose next visit.  Essential hypertension -Well-controlled.    Patient Instructions  -Nice seeing you today!!  -Schedule follow up in 3 months.     Lelon Frohlich, MD  Primary Care at [facility]

## 2019-08-31 ENCOUNTER — Other Ambulatory Visit: Payer: Self-pay | Admitting: Internal Medicine

## 2019-08-31 DIAGNOSIS — I480 Paroxysmal atrial fibrillation: Secondary | ICD-10-CM

## 2019-09-01 ENCOUNTER — Other Ambulatory Visit: Payer: Self-pay | Admitting: Cardiology

## 2019-09-01 DIAGNOSIS — I480 Paroxysmal atrial fibrillation: Secondary | ICD-10-CM

## 2019-09-06 ENCOUNTER — Telehealth: Payer: Medicare Other

## 2019-09-06 NOTE — Chronic Care Management (AMB) (Deleted)
Chronic Care Management Pharmacy  Name: Jason Fisher  MRN: [id number] DOB: 1940/06/12  Chief Complaint/ HPI  Jason Fisher,  79 y.o. , male presents for their Follow-Up CCM visit with the clinical pharmacist via telephone. His wife was with him during the phone call to assist him with his medications. Patient was very pleasant and is very active with church. He loves to do yard work 2-3 times a week and considers that as one of his main forms of exercise.  PCP : Jason Fisher, Jason Halsted, MD  Their chronic conditions include HTN, HLD, Diabetes type 2, COPD, Afib, CAD  Office Visits: 08/11/19: Patient presented to Dr. Jerilee Fisher for follow-up. A1c improved to 6.8%. Patient not taking Eliquis d/t cost.  05/12/19 OV - Diabetes f/u. A1c inreased from 7.1 (12/20) to 7.8. Referral suggested, but patient wanted to increase in insulin and work on diet prior to initiating referral. Tresiba increased from 28 to 32 units at bedtime.   01/24/19 OV - Diabetes f/u. A1c improved to 7.1 from 7.6 (9/20) and 10.8 (3/20). Continue tresiba 28 U at bedtime.  Consult Visit: 07/13/19: Patient presented to Dr. Lamonte Fisher for follow-up. Bevespi helping, patient still smoking a cigarette every few days. No medication changes made.  06/01/19 OV (Jason Fisher, Pulmonology) - COPD. Patient discontinued using Bevespi as he seems it was not accomplishing anything. Resumed taking Bevespi. F/u in 6-8 weeks.  05/18/19 OV (Jason Fisher, Cardio) - Dyspnea on exertion. Might be pulmonary in nature instead. No changes with meds.   03/06/19 OV (Jason Fisher, Cardio) - Afib. No changes with medications.  Medications: Outpatient Encounter Medications as of 09/06/2019  Medication Sig  . ACCU-CHEK AVIVA PLUS test strip CHECK BLOOD SUGAR ONCE DAILY. DX E11.51  . atorvastatin (LIPITOR) 40 MG tablet Take 1 tablet (40 mg total) by mouth daily.  . clopidogrel (PLAVIX) 75 MG tablet Take 1 tablet (75 mg total) by mouth daily.  Marland Kitchen diltiazem (CARDIZEM CD) 360 MG 24  hr capsule Take 1 capsule (360 mg total) by mouth daily.  Marland Kitchen ELIQUIS 5 MG TABS tablet TAKE 1 TABLET BY MOUTH TWICE A DAY  . furosemide (LASIX) 80 MG tablet Take 1 tablet (80 mg total) by mouth daily.  . Glycopyrrolate-Formoterol (BEVESPI AEROSPHERE) 9-4.8 MCG/ACT AERO Inhale 2 puffs into the lungs 2 (two) times daily.  . hydrALAZINE (APRESOLINE) 25 MG tablet Take 1 tablet (25 mg total) by mouth 3 (three) times daily.  . insulin degludec (TRESIBA FLEXTOUCH) 100 UNIT/ML FlexTouch Pen Inject 0.32 mLs (32 Units total) into the skin at bedtime.  . levalbuterol (XOPENEX HFA) 45 MCG/ACT inhaler Inhale 1-2 puffs into the lungs every 6 (six) hours as needed for wheezing.  . metFORMIN (GLUCOPHAGE) 1000 MG tablet TAKE 1 TABLET TWICE DAILY WITH A MEAL  . metoprolol tartrate (LOPRESSOR) 100 MG tablet Take 1 tablet (100 mg total) by mouth 2 (two) times daily.  . nitroGLYCERIN (NITROSTAT) 0.4 MG SL tablet Place 1 tablet (0.4 mg total) under the tongue every 5 (five) minutes as needed for chest pain. X 3 doses  . polyethylene glycol (MIRALAX / GLYCOLAX) packet Take 17 g by mouth daily as needed for mild constipation.   No facility-administered encounter medications on file as of 09/06/2019.     Financial Resource Strain: Low Risk   . Difficulty of Paying Living Expenses: Not hard at all     Physical Activity: Sufficiently Active  . Days of Exercise per Week: 3 days  . Minutes of Exercise per Session: 60  min    Current Diagnosis/Assessment:  Goals Addressed   None    Hypertension   Office blood pressures are  BP Readings from Last 3 Encounters:  08/11/19 130/80  07/13/19 (!) 120/58  06/01/19 (!) 142/80   Patient has failed these meds in the past: chlortalidone, ramipril Patient is currently controlled on the following medications:   Metoprolol 100 mg 1 tablet BID AM and PM  Diltiazem 360 mg 360 mg 1 tablet AM  Hydralazine 25 mg 1 tablet TID AM, NN, PM  Nitrostat 0.4 mg SL 1 tablet every 5  mins PRN for chest pain   We discussed the importance of routinely checking blood pressure. I told him to at least check his blood pressure once a day and he confirmed that he's been doing that. He hasn't had any chest pains in a long time, but still has nitrostat handy just in case. Patient has been doing yard work 2-3 times a week as a form of physical activity.  Plan  Continue current medications and control with diet and exercise    AFIB   Patient has failed these meds in past: None Patient is currently controlled on the following medications:  Diltiazem 360 mg 1 tablet daily  Metoprolol 100 mg 1 tablet BID  Eliquis 5 mg 1 tablet BID AM and PM  We discussed:   Plan  Continue current medications   Heart Failure   Last ejection fraction: LV EF 60-65% (11/05/17)  Patient has failed these meds in past: None Patient is currently controlled on the following medications:   Metoprolol 100 mg 1 tablet BID  Furosemide 80 mg 1 tablet daily PRN  Patient denies having weight gain in a while and has been using furosemide as needed. Patient has been experiencing shortness of breath from increased physical activity, but he said that it has been more of the lungs and is resolved when using his maintenance inhaler.  Plan  Continue current medications  CAD   Patient has failed these meds in past: None Patient is currently controlled on the following medications:   Clopidogrel 75 mg 1 tablet daily  Metoprolol tartrate 100 mg 1 tablet BID  Atorvastatin 40 mg 1 tablet daily  Patient reports adherence with his medications. His lipid panel is superb and WNL. Encouraged him to keep eating healthy and do routine exercise too. Denies any shortness of breath on exertion. Discussed taking breaks in between activities.  Plan  Continue current medications ,  COPD    Eosinophil count:   Lab Results  Component Value Date/Time   EOSPCT 2.2 04/30/2016 12:33 PM  %                                Eos (Absolute):  Lab Results  Component Value Date/Time   EOSABS 0.3 03/06/2019 02:04 PM    Tobacco Status:  Social History   Tobacco Use  Smoking Status Current Some Day Smoker  . Packs/day: 1.00  . Years: 49.00  . Pack years: 49.00  . Types: Cigarettes  . Last attempt to quit: 09/08/2015  . Years since quitting: 3.9  Smokeless Tobacco Never Used  Tobacco Comment   passive smoker as well    Patient has failed these meds in past: None Patient is currently controlled on the following medications:   Bevespi Aerosphere 9.4.8 mg/act 2 puffs BID  Xopenex 45 mcg/act inhaler 1-2 puffs every 6 hrs as need for  wheezing Using maintenance inhaler regularly? Yes Frequency of rescue inhaler use:  1-2x per week  We discussed the importance of using bevespi twice daily. He mentioned that he stopped using it for awhile and his breathing got worse. He reported that since the day he resumed taking it, his breathing got better and has not been taking the xopenex as much (1-2x week)  Plan  Continue current medications   Dyslipidemia   LDL Goal < 70  Lipid Panel     Component Value Date/Time   CHOL 121 03/06/2019 1404   TRIG 88 03/06/2019 1404   HDL 43 03/06/2019 1404   CHOLHDL 2.8 03/06/2019 1404   CHOLHDL 2.9 09/25/2015 0221   VLDL 13 09/25/2015 0221   LDLCALC 61 03/06/2019 1404   LABVLDL 17 03/06/2019 1404     The ASCVD Risk score (Goff DC Jr., et al., 2013) failed to calculate for the following reasons:   The patient has a prior MI or stroke diagnosis   Patient has failed these meds in past: None Patient is currently controlled on the following medications:   Atorvastatin 40 mg 1 tablet at bedtime  His lipid panel is perfect and I encouraged him to keep it up. Has been good with taking atorvastatin and denies any muscle pains or side effects associated with it. Discussed diet and avoid food that is high on cholesterol.   Plan  Continue current medications  and control with diet and exercise   Diabetes   Recent Relevant Labs: Lab Results  Component Value Date/Time   HGBA1C 6.8 (A) 08/11/2019 11:21 AM   HGBA1C 7.8 (A) 05/12/2019 10:42 AM   HGBA1C 7.6 (H) 03/06/2019 02:04 PM   HGBA1C 9.5 (H) 12/21/2017 10:03 AM   GFR 54.45 (L) 12/21/2017 10:03 AM   GFR 61.30 05/24/2017 11:34 AM   MICROALBUR 3.7 (H) 08/30/2015 08:21 AM   MICROALBUR 2.1 (H) 04/22/2015 11:12 AM     Checking BG: Daily in the morning (fasting)  Recent FBG Readings: 120 1 time/day  Rice at home (white) once or twice a month, bread whole wheat, barbeque. 6-7 lbs. Walking yard work 2x week  Patient has failed these meds in past:  Patient is currently controlled on the following medications:   Tresiba Flextouch 100 units/mL - 32 units at bedtime  Metformin 1000 mg 1 tablet BID with meals AM and PM  Last diabetic Eye exam:  Lab Results  Component Value Date/Time   HMDIABEYEEXA Retinopathy (A) 03/22/2017 12:27 PM    Last diabetic Foot exam:  Lab Results  Component Value Date/Time   HMDIABFOOTEX done 12/10/2011 12:00 AM    Discussed with the patient about minimizing food rich in carbs. Patient eats white rice, but only 2-3 times a month, and eats whole wheat bread. Encouraged the patient to increase physical activity which he said that he does yard work 2-3 times a week.  Plan  Continue current medications and control with diet and exercise  OTCs/Miscellaneous    Miralax 1 packet as needed for mild constipation  Medication Management   Pharmacy/benefits: [place], [place], [place] / [place] Patient has her wife to help him out with organizing his medications.  Follow up: 3 month phone visit (09/06/19 at [place])   [facility] [place]

## 2019-10-24 NOTE — Progress Notes (Signed)
Cardiology Office Note    Date:  10/27/2019   ID:  Jason Fisher, DOB [birth year], MRN [id number]  PCP:  [name], [name], MD  Cardiologist:  Dr. Martinique   Chief Complaint  Patient presents with  . Atrial Fibrillation  . Congestive Heart Failure    History of Present Illness:  Jason Fisher is a 79 y.o. male seen for evaluation of SOB and fatigue. He has a PMH of CAD (s/p BMS to RCA 2007, ISR with DES to RCA in 2009, DES to OM2 in 09/2015),chronic atrial fibrillation on Eliquis, chronic diastolic heart failure,HTN, HLD, AAA, COPD,and tobacco use. He was previously admitted in March 2018 for atrial fibrillation with RVR. Troponin was borderline elevated. He was initially treated with IV Cardizem and later switched to p.o. Cardizem prior to discharge. He also underwent AAA endovascular stent grafting by Dr. Trula Slade in June 2018. He was seen in October 2019 by [name] PA-C for increased edema.  Lasix was increased to 80 mg twice daily for 3 days before decreasing back down to 40 mg twice daily thereafter.  Echocardiogram on 11/05/2017 this showed EF 60 to 65%, mildly increased aortic root measuring 40 mm, moderately dilated left and the right atrium.   On follow up today he is not feeling well. He has a lot of congestion in his chest with clear phlegm. Has been using Dayquil. He is more SOB. No increase in edema or weight. He does feel very tired and is dizzy. Had one episode of chest pain relieved with sl Ntg.    Past Medical History:  Diagnosis Date  . 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 BENIGN PROSTATIC HYPERTROPHY, HX OF 02/24/2007  . CAD S/P PCI    a. 2007 Inf STEMI s/p Vision BMS  2 to RCA;  b. 2009 ISR Prox RCA Stent-->with DES;  c. 09/2015 PCI: LM nl, LAD 30p/m, D1 90, LCX nl, OM1 small, 80, OM2 90 (2.5x14 Biofreedom stent), OM3 small, RCA 30p ISR, 6m ISR, 61m, 85d, EF 50-55%.  . 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 Chronic atrial fibrillation ([place])  11/06/2009   a. On Eliquis (CHA2DS2VASc = 5).  Rate controlled w/ BB and CCB.  Marland Kitchen Chronic diastolic CHF (congestive heart failure) ([place])    a. 09/2015 Echo: EF 55-60%, mildly dil LA.  . CKD (chronic kidney disease), stage III    they mentioned this to the patient but they would work on it later after the aneurysm  . COLONIC POLYPS, HX OF 11/02/2006  . COPD (chronic obstructive pulmonary disease) ([place])   . DIABETES MELLITUS, TYPE II 10/29/2006  . Diverticulosis   . History of tobacco abuse    a. Quit 09/2015.  Marland Kitchen HYPERLIPIDEMIA 10/29/2006  . Hypertensive heart disease with heart failure ([place])   . Pneumonia 09/2015   [name] 07/31/2016  . STEMI (ST elevation myocardial infarction) [facility]) 2007   [name] 07/31/2016    Past Surgical History:  Procedure Laterality Date  . ABDOMINAL AORTIC ENDOVASCULAR STENT GRAFT N/A 07/31/2016   Procedure: ABDOMINAL AORTIC ENDOVASCULAR STENT GRAFT;  Surgeon: Serafina Mitchell, MD;  Location: [facility];  Service: Vascular;  Laterality: N/A;  . CARDIAC CATHETERIZATION N/A 09/26/2015   Procedure: Left Heart Cath and Coronary Angiography;  Surgeon: Matteo Banke M Martinique, MD;  Location: [facility] CV LAB;  Service: Cardiovascular;  Laterality: N/A;  . CARDIAC CATHETERIZATION N/A 09/26/2015   Procedure: Coronary Stent Intervention;  Surgeon: Krist Rosenboom M Martinique, MD;  Location: [facility]  CV LAB;  Service: Cardiovascular;  Laterality: N/A;  . CATARACT EXTRACTION Bilateral   . COLONOSCOPY    . CORONARY ANGIOPLASTY WITH STENT PLACEMENT  2007   Inferior STEMI 2007 - RCA PCI with 2 Vision BMS; Abnormal Myoview in 2009 - pRCA ins-stent restenosis & unstented disease - DES PCI Promus 3.0 x 20 mm (3.5 mm)  . ENDOVASCULAR REPAIR/STENT GRAFT  07/31/2016  . INCISION AND DRAINAGE PERIRECTAL ABSCESS N/A 11/06/2015   Procedure: IRRIGATION AND DEBRIDEMENT PERIRECTAL ABSCESS, POSSIBLE HEMORRHOIDECTOMY;  Surgeon: Coralie Keens, MD;  Location: [facility];  Service: General;  Laterality: N/A;  . ORIF SHOULDER  FRACTURE Right    "fell out of back end of a truck"    Current Medications: Outpatient Medications Prior to Visit  Medication Sig Dispense Refill  . ACCU-CHEK AVIVA PLUS test strip CHECK BLOOD SUGAR ONCE DAILY. DX E11.51 100 strip 2  . atorvastatin (LIPITOR) 40 MG tablet Take 1 tablet (40 mg total) by mouth daily. 90 tablet 3  . clopidogrel (PLAVIX) 75 MG tablet Take 1 tablet (75 mg total) by mouth daily. 90 tablet 3  . diltiazem (CARDIZEM CD) 360 MG 24 hr capsule Take 1 capsule (360 mg total) by mouth daily. 90 capsule 4  . ELIQUIS 5 MG TABS tablet TAKE 1 TABLET BY MOUTH TWICE A DAY 60 tablet 4  . furosemide (LASIX) 80 MG tablet Take 1 tablet (80 mg total) by mouth daily. 180 tablet 1  . Glycopyrrolate-Formoterol (BEVESPI AEROSPHERE) 9-4.8 MCG/ACT AERO Inhale 2 puffs into the lungs 2 (two) times daily. 5.9 g 5  . hydrALAZINE (APRESOLINE) 25 MG tablet Take 1 tablet (25 mg total) by mouth 3 (three) times daily. 270 tablet 1  . insulin degludec (TRESIBA FLEXTOUCH) 100 UNIT/ML FlexTouch Pen Inject 0.32 mLs (32 Units total) into the skin at bedtime. 15 pen 2  . levalbuterol (XOPENEX HFA) 45 MCG/ACT inhaler Inhale 1-2 puffs into the lungs every 6 (six) hours as needed for wheezing. 1 Inhaler 3  . metFORMIN (GLUCOPHAGE) 1000 MG tablet TAKE 1 TABLET TWICE DAILY WITH A MEAL 180 tablet 4  . metoprolol tartrate (LOPRESSOR) 100 MG tablet Take 1 tablet (100 mg total) by mouth 2 (two) times daily. 180 tablet 4  . nitroGLYCERIN (NITROSTAT) 0.4 MG SL tablet Place 1 tablet (0.4 mg total) under the tongue every 5 (five) minutes as needed for chest pain. X 3 doses 25 tablet 4  . polyethylene glycol (MIRALAX / GLYCOLAX) packet Take 17 g by mouth daily as needed for mild constipation.     No facility-administered medications prior to visit.     Allergies:   Contrast media [iodinated diagnostic agents]   Social History   Socioeconomic History  . Marital status: Married    Spouse name: Not on file  .  Number of children: 5  . Years of education: Not on file  . Highest education level: Not on file  Occupational History  . Occupation: [employer]  Tobacco Use  . Smoking status: Current Some Day Smoker    Packs/day: 1.00    Years: 49.00    Pack years: 49.00    Types: Cigarettes    Last attempt to quit: 09/08/2015    Years since quitting: 4.1  . Smokeless tobacco: Never Used  . Tobacco comment: passive smoker as well  Vaping Use  . Vaping Use: Never used  Substance and Sexual Activity  . Alcohol use: No    Alcohol/week: 0.0 standard drinks  . Drug use: No  .  Sexual activity: Not on file  Other Topics Concern  . Not on file  Social History Narrative  . Not on file   Social Determinants of Health   Financial Resource Strain: Low Risk   . Difficulty of Paying Living Expenses: Not hard at all  Food Insecurity:   . Worried About Charity fundraiser in the Last Year: Not on file  . Ran Out of Food in the Last Year: Not on file  Transportation Needs:   . Lack of Transportation (Medical): Not on file  . Lack of Transportation (Non-Medical): Not on file  Physical Activity: Sufficiently Active  . Days of Exercise per Week: 3 days  . Minutes of Exercise per Session: 60 min  Stress:   . Feeling of Stress : Not on file  Social Connections:   . Frequency of Communication with Friends and Family: Not on file  . Frequency of Social Gatherings with Friends and Family: Not on file  . Attends Religious Services: Not on file  . Active Member of Clubs or Organizations: Not on file  . Attends Archivist Meetings: Not on file  . Marital Status: Not on file     Family History:  The patient's family history includes Diabetes in his brother and mother; Heart attack in his brother, father, and mother; Hypertension in his father.   ROS:   Please see the history of present illness.    ROS All other systems reviewed and are negative.   PHYSICAL EXAM:   VS:  BP 120/62   Pulse (!)  42   Ht 5\' 8"  (1.727 m)   Wt 180 lb 3.2 oz (81.7 kg)   SpO2 94%   BMI 27.40 kg/m    GENERAL:  Chronically ill appearing WM in NAD HEENT:  PERRL, EOMI, sclera are clear. Oropharynx is clear. NECK:  No jugular venous distention, carotid upstroke brisk and symmetric, no bruits, no thyromegaly or adenopathy LUNGS:  Coarse BS bilaterally CHEST:  Unremarkable HEART:  IRRR- slow,  PMI not displaced or sustained,S1 and S2 within normal limits, no S3, no S4: no clicks, no rubs, no murmurs ABD:  Soft, nontender. BS +, no masses or bruits. No hepatomegaly, no splenomegaly EXT:  2 + pulses throughout, no edema, no cyanosis no clubbing SKIN:  Warm and dry.  No rashes NEURO:  Alert and oriented x 3. Cranial nerves II through XII intact. PSYCH:  Cognitively intact    Wt Readings from Last 3 Encounters:  10/27/19 180 lb 3.2 oz (81.7 kg)  08/11/19 181 lb 11.2 oz (82.4 kg)  07/13/19 181 lb 6.4 oz (82.3 kg)      Studies/Labs Reviewed:   EKG:  EKG is  ordered today.  Afib with junctional escape rate 42. ST T changes c/w lateral ischemia. I have personally reviewed and interpreted this study.   Recent Labs: 03/06/2019: ALT 10; BUN 21; Creatinine, Ser 1.39; Hemoglobin 14.2; Platelets 180; Potassium 4.5; Sodium 146   Lipid Panel    Component Value Date/Time   CHOL 121 03/06/2019 1404   TRIG 88 03/06/2019 1404   HDL 43 03/06/2019 1404   CHOLHDL 2.8 03/06/2019 1404   CHOLHDL 2.9 09/25/2015 0221   VLDL 13 09/25/2015 0221   LDLCALC 61 03/06/2019 1404    Additional studies/ records that were reviewed today include:   Cath 09/26/2015  Prox LAD to Mid LAD lesion, 30 %stenosed.  1st Diag lesion, 90 %stenosed.  Ost 1st Mrg to 1st Mrg lesion, 80 %stenosed.  Ost RCA to Prox RCA lesion, 30 %stenosed.  Mid RCA-2 lesion, 40 %stenosed.  Mid RCA-1 lesion, 15 %stenosed.  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  There is mild left ventricular systolic  dysfunction.  The left ventricular ejection fraction is 50-55% by visual estimate.  LV end diastolic pressure is moderately elevated.  A drug eluting stent was successfully placed.  2nd Mrg lesion, 90 %stenosed.  Post intervention, there is a 0% residual stenosis.  1. 3 vessel obstructive CAD.  - chronic severe stenosis in the first diagonal - New 90% stenosis in a large second OM - Stents in the proximal and mid RCA are patent - Aneurysm and stenosis in the PLOM branch of the RCA- the aneurysm is new compared to 2009 2. Mild LV dysfunction 3. Elevated LVEDP 4. Allergic response to contrast with hives and bronchospasm. 5. Successful stenting of the second OM with a Biofreedom stent.   Plan: continue IV diuresis. Patient received steroids, Benadryl, and IV pepcid for contrast reaction. Continue DAPT for one month then DC ASA. Resume Eliquis in am.     Echo 11/05/2017 LV EF: 60% - 65% Study Conclusions  - Left ventricle: The cavity size was normal. There was moderate concentric hypertrophy. Systolic function was normal. The estimated ejection fraction was in the range of 60% to 65%. Wall motion was normal; there were no regional wall motion abnormalities. The study was not technically sufficient to allow evaluation of LV diastolic dysfunction due to atrial fibrillation. - Aortic valve: Poorly visualized. Trileaflet; moderately thickened, moderately calcified leaflets. - Aorta: Aortic root dimension: 40 mm (ED). Ascending aortic diameter: 38 mm (S). - Aortic root: The aortic root was mildly dilated. - Ascending aorta: The ascending aorta was mildly dilated. - Left atrium: The atrium was moderately dilated. - Right atrium: The atrium was moderately dilated. - Pulmonic valve: There was mild regurgitation. - Pulmonary arteries: Systolic pressure could not be accurately estimated.     ASSESSMENT:    1. Chronic atrial fibrillation  ([place])   2. Chronic diastolic congestive heart failure ([place])   3. Dyspnea on exertion   4. Coronary artery disease involving native coronary artery of native heart without angina pectoris   5. Tachycardia-bradycardia syndrome ([place])      PLAN:  In order of problems listed above:  1. Chronic atrial fibrillation. Rate is markedly reduced today with junctional escape at 42 bpm.  Will discontinue Cardizem. Reduce metoprolol to 50 mg bid. Will arrange repeat Ecg next week to monitor response. Continue Eliquis.   2. CAD: s/p multiple interventions in the past- last in 2017. On Plavix and lopressor. On statin. Minimal angina.  3. Chronic diastolic CHF: volume status is good on current lasix dose. No edema and weight is stable.   4. Hyperlipidemia: On Lipitor 40 mg daily.  LDL 61  5. History of AAA repair: Followed by Dr. Trula Slade  6..  DM type 2 on metformin.  A1c- 7.6.   7.   Dyspnea and cough.  I think this is likely more pulmonary related. Will  Check CXR. May need to see Dr Lamonte Sakai again.    Medication Adjustments/Labs and Tests Ordered: Current medicines are reviewed at length with the patient today.  Concerns regarding medicines are outlined above.  Medication changes, Labs and Tests ordered today are listed in the Patient Instructions below. Patient Instructions  Stop taking Diltiazem(Cardizem)  Reduce metoprolol to 50 mg twice a day.  We will repeat  an Ecg next week  We will schedule you for a chest Xray.      Signed, Terron Merfeld Martinique, MD  10/27/2019 5:09 PM    [facility] [address], [place], [place]  [address] Phone: [phone]; Fax: [phone]

## 2019-10-27 ENCOUNTER — Other Ambulatory Visit: Payer: Self-pay

## 2019-10-27 ENCOUNTER — Ambulatory Visit: Payer: Medicare Other | Admitting: Cardiology

## 2019-10-27 ENCOUNTER — Encounter: Payer: Self-pay | Admitting: Cardiology

## 2019-10-27 VITALS — BP 120/62 | HR 42 | Ht 68.0 in | Wt 180.2 lb

## 2019-10-27 DIAGNOSIS — I2510 Atherosclerotic heart disease of native coronary artery without angina pectoris: Secondary | ICD-10-CM | POA: Diagnosis not present

## 2019-10-27 DIAGNOSIS — R0609 Other forms of dyspnea: Secondary | ICD-10-CM

## 2019-10-27 DIAGNOSIS — I5032 Chronic diastolic (congestive) heart failure: Secondary | ICD-10-CM | POA: Diagnosis not present

## 2019-10-27 DIAGNOSIS — I482 Chronic atrial fibrillation, unspecified: Secondary | ICD-10-CM

## 2019-10-27 DIAGNOSIS — R0600 Dyspnea, unspecified: Secondary | ICD-10-CM

## 2019-10-27 DIAGNOSIS — I495 Sick sinus syndrome: Secondary | ICD-10-CM

## 2019-10-27 NOTE — Patient Instructions (Signed)
Stop taking Diltiazem(Cardizem)  Reduce metoprolol to 50 mg twice a day.  We will repeat an Ecg next week  We will schedule you for a chest Xray.

## 2019-10-30 ENCOUNTER — Ambulatory Visit
Admission: RE | Admit: 2019-10-30 | Discharge: 2019-10-30 | Disposition: A | Discharge status: Home / Self Care | Payer: Medicare Other | Source: Ambulatory Visit | Attending: Cardiology | Admitting: Cardiology

## 2019-10-30 DIAGNOSIS — I495 Sick sinus syndrome: Secondary | ICD-10-CM

## 2019-10-30 DIAGNOSIS — I2510 Atherosclerotic heart disease of native coronary artery without angina pectoris: Secondary | ICD-10-CM

## 2019-10-30 DIAGNOSIS — R0609 Other forms of dyspnea: Secondary | ICD-10-CM

## 2019-10-30 DIAGNOSIS — J9811 Atelectasis: Secondary | ICD-10-CM | POA: Diagnosis not present

## 2019-10-30 DIAGNOSIS — J90 Pleural effusion, not elsewhere classified: Secondary | ICD-10-CM | POA: Diagnosis not present

## 2019-10-30 DIAGNOSIS — I482 Chronic atrial fibrillation, unspecified: Secondary | ICD-10-CM

## 2019-10-30 DIAGNOSIS — I5032 Chronic diastolic (congestive) heart failure: Secondary | ICD-10-CM

## 2019-10-31 ENCOUNTER — Telehealth: Payer: Self-pay | Admitting: Cardiology

## 2019-10-31 NOTE — Telephone Encounter (Signed)
Spoke to patient and wife.CXR results and instructions given.

## 2019-10-31 NOTE — Telephone Encounter (Signed)
     Pt's wife returning call from [place] to get result

## 2019-11-01 ENCOUNTER — Telehealth (INDEPENDENT_AMBULATORY_CARE_PROVIDER_SITE_OTHER): Payer: Medicare Other

## 2019-11-01 DIAGNOSIS — I482 Chronic atrial fibrillation, unspecified: Secondary | ICD-10-CM

## 2019-11-01 DIAGNOSIS — I10 Essential (primary) hypertension: Secondary | ICD-10-CM

## 2019-11-01 DIAGNOSIS — I480 Paroxysmal atrial fibrillation: Secondary | ICD-10-CM

## 2019-11-01 MED ORDER — METOPROLOL TARTRATE 100 MG PO TABS: 100.0000 mg | ORAL_TABLET | Freq: Two times a day (BID) | ORAL | 3 refills | Status: DC

## 2019-11-01 NOTE — Telephone Encounter (Signed)
Patient came to office for a EKG.EKG reviewed by Dr.Jordan revealed afib with rate 119.Dr.Jordan advised to increase Metoprolol back to 100 mg twice a day.Keep appointment as planned 12/3 at 10:20 am

## 2019-11-02 ENCOUNTER — Other Ambulatory Visit: Payer: Self-pay | Admitting: Internal Medicine

## 2019-11-02 DIAGNOSIS — E785 Hyperlipidemia, unspecified: Secondary | ICD-10-CM

## 2019-11-03 ENCOUNTER — Other Ambulatory Visit: Payer: Self-pay | Admitting: Internal Medicine

## 2019-11-03 DIAGNOSIS — E1151 Type 2 diabetes mellitus with diabetic peripheral angiopathy without gangrene: Secondary | ICD-10-CM

## 2019-11-07 ENCOUNTER — Other Ambulatory Visit: Payer: Self-pay

## 2019-11-07 ENCOUNTER — Ambulatory Visit (INDEPENDENT_AMBULATORY_CARE_PROVIDER_SITE_OTHER): Payer: Medicare Other | Admitting: Internal Medicine

## 2019-11-07 VITALS — BP 120/68 | HR 80 | Temp 97.7°F | Wt 174.5 lb

## 2019-11-07 DIAGNOSIS — I5033 Acute on chronic diastolic (congestive) heart failure: Secondary | ICD-10-CM

## 2019-11-07 DIAGNOSIS — E1151 Type 2 diabetes mellitus with diabetic peripheral angiopathy without gangrene: Secondary | ICD-10-CM

## 2019-11-07 DIAGNOSIS — N1832 Chronic kidney disease, stage 3b: Secondary | ICD-10-CM | POA: Diagnosis not present

## 2019-11-07 DIAGNOSIS — I10 Essential (primary) hypertension: Secondary | ICD-10-CM

## 2019-11-07 DIAGNOSIS — E785 Hyperlipidemia, unspecified: Secondary | ICD-10-CM | POA: Diagnosis not present

## 2019-11-07 DIAGNOSIS — I482 Chronic atrial fibrillation, unspecified: Secondary | ICD-10-CM

## 2019-11-07 LAB — POCT GLYCOSYLATED HEMOGLOBIN (HGB A1C): Hemoglobin A1C: 6.6 % — AB (ref 4.0–5.6)

## 2019-11-07 MED ORDER — FUROSEMIDE 80 MG PO TABS: 80.0000 mg | ORAL_TABLET | Freq: Every day | ORAL | 1 refills | Status: DC

## 2019-11-07 NOTE — Progress Notes (Signed)
Established Patient Office Visit     This visit occurred during the SARS-CoV-2 public health emergency.  Safety protocols were in place, including screening questions prior to the visit, additional usage of staff PPE, and extensive cleaning of exam room while observing appropriate contact time as indicated for disinfecting solutions.    CC/Reason for Visit: 25-month follow-up chronic medical conditions  HPI: Jason Fisher is a 79 y.o. male who is coming in today for the above mentioned reasons. Past Medical History is significant for:  Chronic diastolic heart failure, benign essential hypertension,AAA,type 2 diabetes, chronic atrial fibrillation, chronic kidney disease stage III.He sees me mainly for diabetic care. We have been working on insulin titration and getting A1c down. There were some comprehension barriers initially. We had a HHRN reach out to him. He is doing better. Unfortunately, he does not bring his CBG log in today as requested.  He has been taking [place] 32 units at bedtime as instructed.  He has been compliant, states he has been eating healthy, he has been able to lose around 5 pounds since last visit.  He has no acute complaints today.  He declines flu vaccination.   Past Medical/Surgical History: Past Medical History:  Diagnosis Date  . 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 BENIGN PROSTATIC HYPERTROPHY, HX OF 02/24/2007  . CAD S/P PCI    a. 2007 Inf STEMI s/p Vision BMS  2 to RCA;  b. 2009 ISR Prox RCA Stent-->with DES;  c. 09/2015 PCI: LM nl, LAD 30p/m, D1 90, LCX nl, OM1 small, 80, OM2 90 (2.5x14 Biofreedom stent), OM3 small, RCA 30p ISR, 61m ISR, 91m, 85d, EF 50-55%.  . 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 Chronic atrial fibrillation ([place]) 11/06/2009   a. On Eliquis (CHA2DS2VASc = 5).  Rate controlled w/ BB and CCB.  Marland Kitchen Chronic diastolic CHF (congestive heart failure) ([place])    a. 09/2015 Echo: EF 55-60%, mildly dil LA.  . CKD (chronic kidney disease),  stage III    they mentioned this to the patient but they would work on it later after the aneurysm  . COLONIC POLYPS, HX OF 11/02/2006  . COPD (chronic obstructive pulmonary disease) ([place])   . DIABETES MELLITUS, TYPE II 10/29/2006  . Diverticulosis   . History of tobacco abuse    a. Quit 09/2015.  Marland Kitchen HYPERLIPIDEMIA 10/29/2006  . Hypertensive heart disease with heart failure ([place])   . Pneumonia 09/2015   [name] 07/31/2016  . STEMI (ST elevation myocardial infarction) [facility]) 2007   [name] 07/31/2016    Past Surgical History:  Procedure Laterality Date  . ABDOMINAL AORTIC ENDOVASCULAR STENT GRAFT N/A 07/31/2016   Procedure: ABDOMINAL AORTIC ENDOVASCULAR STENT GRAFT;  Surgeon: Serafina Mitchell, MD;  Location: [place];  Service: Vascular;  Laterality: N/A;  . CARDIAC CATHETERIZATION N/A 09/26/2015   Procedure: Left Heart Cath and Coronary Angiography;  Surgeon: Peter M Martinique, MD;  Location: [place] [facility];  Service: Cardiovascular;  Laterality: N/A;  . CARDIAC CATHETERIZATION N/A 09/26/2015   Procedure: Coronary Stent Intervention;  Surgeon: Peter M Martinique, MD;  Location: [place] [facility];  Service: Cardiovascular;  Laterality: N/A;  . CATARACT EXTRACTION Bilateral   . COLONOSCOPY    . CORONARY ANGIOPLASTY WITH STENT PLACEMENT  2007   Inferior STEMI 2007 - RCA PCI with 2 Vision BMS; Abnormal Myoview in 2009 - pRCA ins-stent restenosis & unstented disease - DES PCI Promus 3.0 x 20 mm (3.5 mm)  . ENDOVASCULAR  REPAIR/STENT GRAFT  07/31/2016  . INCISION AND DRAINAGE PERIRECTAL ABSCESS N/A 11/06/2015   Procedure: IRRIGATION AND DEBRIDEMENT PERIRECTAL ABSCESS, POSSIBLE HEMORRHOIDECTOMY;  Surgeon: Coralie Keens, MD;  Location: [place];  Service: General;  Laterality: N/A;  . ORIF SHOULDER FRACTURE Right    "fell out of back end of a truck"    Social History:  reports that he has been smoking cigarettes. He has a 49.00 pack-year smoking history. He has never used smokeless tobacco. He reports that  he does not drink alcohol and does not use drugs.  Allergies: Allergies  Allergen Reactions  . Contrast Media [Iodinated Diagnostic Agents] Anaphylaxis    Family History:  Family History  Problem Relation Age of Onset  . Heart attack Mother   . Diabetes Mother   . Heart attack Father   . Hypertension Father   . Heart attack Brother   . Diabetes Brother      Current Outpatient Medications:  .  ACCU-CHEK AVIVA PLUS test strip, CHECK BLOOD SUGAR ONCE DAILY. DX E11.51, Disp: 100 strip, Rfl: 2 .  atorvastatin (LIPITOR) 40 MG tablet, TAKE 1 TABLET BY MOUTH EVERY DAY, Disp: 90 tablet, Rfl: 1 .  clopidogrel (PLAVIX) 75 MG tablet, Take 1 tablet (75 mg total) by mouth daily., Disp: 90 tablet, Rfl: 3 .  ELIQUIS 5 MG TABS tablet, TAKE 1 TABLET BY MOUTH TWICE A DAY, Disp: 60 tablet, Rfl: 4 .  furosemide (LASIX) 80 MG tablet, Take 1 tablet (80 mg total) by mouth daily., Disp: 180 tablet, Rfl: 1 .  Glycopyrrolate-Formoterol (BEVESPI AEROSPHERE) 9-4.8 MCG/ACT AERO, Inhale 2 puffs into the lungs 2 (two) times daily., Disp: 5.9 g, Rfl: 5 .  hydrALAZINE (APRESOLINE) 25 MG tablet, Take 1 tablet (25 mg total) by mouth 3 (three) times daily., Disp: 270 tablet, Rfl: 1 .  levalbuterol (XOPENEX HFA) 45 MCG/ACT inhaler, Inhale 1-2 puffs into the lungs every 6 (six) hours as needed for wheezing., Disp: 1 Inhaler, Rfl: 3 .  metFORMIN (GLUCOPHAGE) 1000 MG tablet, TAKE 1 TABLET TWICE DAILY WITH A MEAL, Disp: 180 tablet, Rfl: 4 .  metoprolol tartrate (LOPRESSOR) 100 MG tablet, Take 1 tablet (100 mg total) by mouth 2 (two) times daily., Disp: 180 tablet, Rfl: 3 .  nitroGLYCERIN (NITROSTAT) 0.4 MG SL tablet, Place 1 tablet (0.4 mg total) under the tongue every 5 (five) minutes as needed for chest pain. X 3 doses, Disp: 25 tablet, Rfl: 4 .  polyethylene glycol (MIRALAX / GLYCOLAX) packet, Take 17 g by mouth daily as needed for mild constipation., Disp: , Rfl:  .  TRESIBA FLEXTOUCH 100 UNIT/ML FlexTouch Pen, INJECT 32  UNITS INTO THE SKIN AT BEDTIME., Disp: 15 mL, Rfl: 2 .  diltiazem (CARDIZEM CD) 360 MG 24 hr capsule, Take 1 capsule (360 mg total) by mouth daily. (Patient not taking: Reported on 11/07/2019), Disp: 90 capsule, Rfl: 4  Review of Systems:  Constitutional: Denies fever, chills, diaphoresis, appetite change and fatigue.  HEENT: Denies photophobia, eye pain, redness, hearing loss, ear pain, congestion, sore throat, rhinorrhea, sneezing, mouth sores, trouble swallowing, neck pain, neck stiffness and tinnitus.   Respiratory: Denies SOB, DOE, cough, chest tightness,  and wheezing.   Cardiovascular: Denies chest pain, palpitations and leg swelling.  Gastrointestinal: Denies nausea, vomiting, abdominal pain, diarrhea, constipation, blood in stool and abdominal distention.  Genitourinary: Denies dysuria, urgency, frequency, hematuria, flank pain and difficulty urinating.  Endocrine: Denies: hot or cold intolerance, sweats, changes in hair or nails, polyuria, polydipsia. Musculoskeletal: Denies myalgias, back  pain, joint swelling, arthralgias and gait problem.  Skin: Denies pallor, rash and wound.  Neurological: Denies dizziness, seizures, syncope, weakness, light-headedness, numbness and headaches.  Hematological: Denies adenopathy. Easy bruising, personal or family bleeding history  Psychiatric/Behavioral: Denies suicidal ideation, mood changes, confusion, nervousness, sleep disturbance and agitation    Physical Exam: Vitals:   11/07/19 1045  BP: 120/68  Pulse: 80  Temp: 97.7 F (36.5 C)  TempSrc: Oral  SpO2: 98%  Weight: 174 lb 8 oz (79.2 kg)    Body mass index is 26.53 kg/m.   Constitutional: NAD, calm, comfortable Eyes: PERRL, lids and conjunctivae normal, wears corrective lenses ENMT: Mucous membranes are moist. Respiratory: clear to auscultation bilaterally, no wheezing, no crackles. Normal respiratory effort. No accessory muscle use.  Cardiovascular: Regular rate and rhythm, no  murmurs / rubs / gallops. No extremity edema.  Neurologic: Grossly intact and nonfocal Psychiatric: Normal judgment and insight. Alert and oriented x 3. Normal mood.    Impression and Plan:  DM (diabetes mellitus), type 2 with peripheral vascular complications ([place])  -Well-controlled with an A1c of 6.6 with [place] 32 units at bedtime in addition to Metformin.  Chronic diastolic congestive heart failure (HCC)  -Compensated, he is requesting Lasix refills today. -He is also on statin, beta-blocker.  Stage 3b chronic kidney disease ([place]) -Stable with a baseline creatinine around 1.3-1.4.  Essential hypertension -Well-controlled, he has been taken off diltiazem per cardiology instructions.  Dyslipidemia -Well-controlled, last LDL was 61 in February 2021  Chronic atrial fibrillation ([place]) -Rate controlled, anticoagulated, followed by cardiology    Lelon Frohlich, MD [facility]

## 2019-11-16 ENCOUNTER — Other Ambulatory Visit: Payer: Self-pay | Admitting: Internal Medicine

## 2019-11-20 ENCOUNTER — Other Ambulatory Visit: Payer: Self-pay | Admitting: Internal Medicine

## 2019-11-20 DIAGNOSIS — I480 Paroxysmal atrial fibrillation: Secondary | ICD-10-CM

## 2019-11-20 DIAGNOSIS — I10 Essential (primary) hypertension: Secondary | ICD-10-CM

## 2020-01-01 NOTE — Progress Notes (Signed)
Cardiology Office Note    Date:  01/05/2020   ID:  Jason Fisher, DOB [birth year], MRN [id number]  PCP:  Jason Fisher, Jason Halsted, MD  Cardiologist:  Dr. Martinique   Chief Complaint  Patient presents with  . Coronary Artery Disease  . Atrial Fibrillation  . Shortness of Breath    History of Present Illness:  Jason Fisher is a 79 y.o. male seen for evaluation of SOB and fatigue. He has a PMH of CAD (s/p BMS to RCA 2007, ISR with DES to RCA in 2009, DES to OM2 in 09/2015),chronic atrial fibrillation on Eliquis, chronic diastolic heart failure,HTN, HLD, AAA, COPD,and tobacco use. He was previously admitted in March 2018 for atrial fibrillation with RVR. Troponin was borderline elevated. He was initially treated with IV Cardizem and later switched to p.o. Cardizem prior to discharge. He also underwent AAA endovascular stent grafting by Dr. Trula Fisher in June 2018. He was seen in October 2019 by Jason Deforest PA-C for increased edema.  Lasix was increased to 80 mg twice daily for 3 days before decreasing back down to 40 mg twice daily thereafter.  Echocardiogram on 11/05/2017 this showed EF 60 to 65%, mildly increased aortic root measuring 40 mm, moderately dilated left and the right atrium.   When seen in September he was not feeling well. He has a lot of congestion in his chest with clear phlegm. Had been using Dayquil. He was more SOB. No increase in edema or weight. He  felt very tired and is dizzy. Had one episode of chest pain relieved with sl Ntg. He was found to be markedly bradycardic with HR 42. Diltiazem was stopped and metoprolol dose reduced. On follow up one week later HR was 119 in Afib. Metoprolol was increased back to 100 mg bid. CXR showed mild pleural effusions suggestive of mild CHF. Emphysema.   On follow up today he is feeling better. Energy is improved. Cough resolved. Breathing is better. No dizziness now. No chest pain.    Past Medical History:  Diagnosis Date  .  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 BENIGN PROSTATIC HYPERTROPHY, HX OF 02/24/2007  . CAD S/P PCI    a. 2007 Inf STEMI s/p Vision BMS  2 to RCA;  b. 2009 ISR Prox RCA Stent-->with DES;  c. 09/2015 PCI: LM nl, LAD 30p/m, D1 90, LCX nl, OM1 small, 80, OM2 90 (2.5x14 Biofreedom stent), OM3 small, RCA 30p ISR, 14m ISR, 47m, 85d, EF 50-55%.  . 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 Chronic atrial fibrillation ([place]) 11/06/2009   a. On Eliquis (CHA2DS2VASc = 5).  Rate controlled w/ BB and CCB.  Marland Kitchen Chronic diastolic CHF (congestive heart failure) ([place])    a. 09/2015 Echo: EF 55-60%, mildly dil LA.  . CKD (chronic kidney disease), stage III ([place])    they mentioned this to the patient but they would work on it later after the aneurysm  . COLONIC POLYPS, HX OF 11/02/2006  . COPD (chronic obstructive pulmonary disease) ([place])   . DIABETES MELLITUS, TYPE II 10/29/2006  . Diverticulosis   . History of tobacco abuse    a. Quit 09/2015.  Marland Kitchen HYPERLIPIDEMIA 10/29/2006  . Hypertensive heart disease with heart failure ([place])   . Pneumonia 09/2015   Jason Fisher 07/31/2016  . STEMI (ST elevation myocardial infarction) [facility]) 2007   Jason Fisher 07/31/2016    Past Surgical History:  Procedure Laterality Date  . ABDOMINAL AORTIC ENDOVASCULAR STENT GRAFT N/A 07/31/2016   Procedure:  ABDOMINAL AORTIC ENDOVASCULAR STENT GRAFT;  Surgeon: Jason Mitchell, MD;  Location[facility];  Service: Vascular;  Laterality: N/A;  . CARDIAC CATHETERIZATION N/A 09/26/2015   Procedure: Left Heart Cath and Coronary Angiography;  Surgeon: Jason Denz M Martinique, MD;  Location: [place] [facility];  Service: Cardiovascular;  Laterality: N/A;  . CARDIAC CATHETERIZATION N/A 09/26/2015   Procedure: Coronary Stent Intervention;  Surgeon: Jason Noy M Martinique, MD;  Location: [place] [facility];  Service: Cardiovascular;  Laterality: N/A;  . CATARACT EXTRACTION Bilateral   . COLONOSCOPY    . CORONARY ANGIOPLASTY WITH STENT PLACEMENT  2007   Inferior STEMI 2007 - RCA PCI  with 2 Vision BMS; Abnormal Myoview in 2009 - pRCA ins-stent restenosis & unstented disease - DES PCI Promus 3.0 x 20 mm (3.5 mm)  . ENDOVASCULAR REPAIR/STENT GRAFT  07/31/2016  . INCISION AND DRAINAGE PERIRECTAL ABSCESS N/A 11/06/2015   Procedure: IRRIGATION AND DEBRIDEMENT PERIRECTAL ABSCESS, POSSIBLE HEMORRHOIDECTOMY;  Surgeon: Jason Keens, MD;  Location: [place];  Service: General;  Laterality: N/A;  . ORIF SHOULDER FRACTURE Right    "fell out of back end of a truck"    Current Medications: Outpatient Medications Prior to Visit  Medication Sig Dispense Refill  . ACCU-CHEK AVIVA PLUS test strip CHECK BLOOD SUGAR ONCE DAILY. DX E11.51 100 strip 2  . atorvastatin (LIPITOR) 40 MG tablet TAKE 1 TABLET BY MOUTH EVERY DAY 90 tablet 1  . clopidogrel (PLAVIX) 75 MG tablet TAKE 1 TABLET BY MOUTH EVERY DAY 90 tablet 3  . ELIQUIS 5 MG TABS tablet TAKE 1 TABLET BY MOUTH TWICE A DAY 60 tablet 4  . furosemide (LASIX) 80 MG tablet Take 1 tablet (80 mg total) by mouth daily. 180 tablet 1  . Glycopyrrolate-Formoterol (BEVESPI AEROSPHERE) 9-4.8 MCG/ACT AERO Inhale 2 puffs into the lungs 2 (two) times daily. 5.9 g 5  . hydrALAZINE (APRESOLINE) 25 MG tablet TAKE 1 TABLET BY MOUTH THREE TIMES A DAY 270 tablet 1  . levalbuterol (XOPENEX HFA) 45 MCG/ACT inhaler Inhale 1-2 puffs into the lungs every 6 (six) hours as needed for wheezing. 1 Inhaler 3  . metFORMIN (GLUCOPHAGE) 1000 MG tablet TAKE 1 TABLET TWICE DAILY WITH A MEAL 180 tablet 4  . metoprolol tartrate (LOPRESSOR) 100 MG tablet Take 1 tablet (100 mg total) by mouth 2 (two) times daily. 180 tablet 3  . nitroGLYCERIN (NITROSTAT) 0.4 MG SL tablet Place 1 tablet (0.4 mg total) under the tongue every 5 (five) minutes as needed for chest pain. X 3 doses 25 tablet 4  . polyethylene glycol (MIRALAX / GLYCOLAX) packet Take 17 g by mouth daily as needed for mild constipation.    . TRESIBA FLEXTOUCH 100 UNIT/ML FlexTouch Pen INJECT 32 UNITS INTO THE SKIN AT  BEDTIME. 15 mL 2  . diltiazem (CARDIZEM CD) 360 MG 24 hr capsule TAKE 1 CAPSULE BY MOUTH EVERY DAY 90 capsule 4   No facility-administered medications prior to visit.     Allergies:   Contrast media [iodinated diagnostic agents]   Social History   Socioeconomic History  . Marital status: Married    Spouse name: Not on file  . Number of children: 5  . Years of education: Not on file  . Highest education level: Not on file  Occupational History  . Occupation: [employer]  Tobacco Use  . Smoking status: Current Some Day Smoker    Packs/day: 1.00    Years: 49.00    Pack years: 49.00    Types:  Cigarettes    Last attempt to quit: 09/08/2015    Years since quitting: 4.3  . Smokeless tobacco: Never Used  . Tobacco comment: passive smoker as well  Vaping Use  . Vaping Use: Never used  Substance and Sexual Activity  . Alcohol use: No    Alcohol/week: 0.0 standard drinks  . Drug use: No  . Sexual activity: Not on file  Other Topics Concern  . Not on file  Social History Narrative  . Not on file   Social Determinants of Health   Financial Resource Strain: Low Risk   . Difficulty of Paying Living Expenses: Not hard at all  Food Insecurity:   . Worried About Charity fundraiser in the Last Year: Not on file  . Ran Out of Food in the Last Year: Not on file  Transportation Needs:   . Lack of Transportation (Medical): Not on file  . Lack of Transportation (Non-Medical): Not on file  Physical Activity: Sufficiently Active  . Days of Exercise per Week: 3 days  . Minutes of Exercise per Session: 60 min  Stress:   . Feeling of Stress : Not on file  Social Connections:   . Frequency of Communication with Friends and Family: Not on file  . Frequency of Social Gatherings with Friends and Family: Not on file  . Attends Religious Services: Not on file  . Active Member of Clubs or Organizations: Not on file  . Attends Archivist Meetings: Not on file  . Marital Status: Not  on file     Family History:  The patient's family history includes Diabetes in his brother and mother; Heart attack in his brother, father, and mother; Hypertension in his father.   ROS:   Please see the history of present illness.    ROS All other systems reviewed and are negative.   PHYSICAL EXAM:   VS:  BP 130/84 (BP Location: Left Arm, Cuff Size: Normal)   Pulse 78   Temp (!) 96.4 F (35.8 C)   Ht 5\' 8"  (1.727 m)   Wt 181 lb 3.2 oz (82.2 kg)   SpO2 92%   BMI 27.55 kg/m    GENERAL:  Chronically ill appearing WM in NAD HEENT:  PERRL, EOMI, sclera are clear. Oropharynx is clear. NECK:  No jugular venous distention, carotid upstroke brisk and symmetric, no bruits, no thyromegaly or adenopathy LUNGS:  Coarse BS bilaterally. Decreased BS.  CHEST:  Unremarkable HEART:  IRRR,  PMI not displaced or sustained,S1 and S2 within normal limits, no S3, no S4: no clicks, no rubs, no murmurs ABD:  Soft, nontender. BS +, no masses or bruits. No hepatomegaly, no splenomegaly EXT:  2 + pulses throughout, no edema, no cyanosis no clubbing SKIN:  Warm and dry.  No rashes NEURO:  Alert and oriented x 3. Cranial nerves II through XII intact. PSYCH:  Cognitively intact    Wt Readings from Last 3 Encounters:  01/05/20 181 lb 3.2 oz (82.2 kg)  11/07/19 174 lb 8 oz (79.2 kg)  10/27/19 180 lb 3.2 oz (81.7 kg)      Studies/Labs Reviewed:   EKG:  EKG is not  ordered today.     Recent Labs: 03/06/2019: ALT 10; BUN 21; Creatinine, Ser 1.39; Hemoglobin 14.2; Platelets 180; Potassium 4.5; Sodium 146   Lipid Panel    Component Value Date/Time   CHOL 121 03/06/2019 1404   TRIG 88 03/06/2019 1404   HDL 43 03/06/2019 1404   CHOLHDL  2.8 03/06/2019 1404   CHOLHDL 2.9 09/25/2015 0221   VLDL 13 09/25/2015 0221   LDLCALC 61 03/06/2019 1404    Additional studies/ records that were reviewed today include:   Cath 09/26/2015  Prox LAD to Mid LAD lesion, 30 %stenosed.  1st Diag lesion, 90  %stenosed.  Ost 1st Mrg to 1st Mrg lesion, 80 %stenosed.  Ost RCA to Prox RCA lesion, 30 %stenosed.  Mid RCA-2 lesion, 40 %stenosed.  Mid RCA-1 lesion, 15 %stenosed.  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  There is mild left ventricular systolic dysfunction.  The left ventricular ejection fraction is 50-55% by visual estimate.  LV end diastolic pressure is moderately elevated.  A drug eluting stent was successfully placed.  2nd Mrg lesion, 90 %stenosed.  Post intervention, there is a 0% residual stenosis.  1. 3 vessel obstructive CAD.  - chronic severe stenosis in the first diagonal - New 90% stenosis in a large second OM - Stents in the proximal and mid RCA are patent - Aneurysm and stenosis in the PLOM branch of the RCA- the aneurysm is new compared to 2009 2. Mild LV dysfunction 3. Elevated LVEDP 4. Allergic response to contrast with hives and bronchospasm. 5. Successful stenting of the second OM with a Biofreedom stent.   Plan: continue IV diuresis. Patient received steroids, Benadryl, and IV pepcid for contrast reaction. Continue DAPT for one month then DC ASA. Resume Eliquis in am.     Echo 11/05/2017 LV EF: 60% - 65% Study Conclusions  - Left ventricle: The cavity size was normal. There was moderate concentric hypertrophy. Systolic function was normal. The estimated ejection fraction was in the range of 60% to 65%. Wall motion was normal; there were no regional wall motion abnormalities. The study was not technically sufficient to allow evaluation of LV diastolic dysfunction due to atrial fibrillation. - Aortic valve: Poorly visualized. Trileaflet; moderately thickened, moderately calcified leaflets. - Aorta: Aortic root dimension: 40 mm (ED). Ascending aortic diameter: 38 mm (S). - Aortic root: The aortic root was mildly dilated. - Ascending aorta: The ascending aorta was mildly dilated. -  Left atrium: The atrium was moderately dilated. - Right atrium: The atrium was moderately dilated. - Pulmonic valve: There was mild regurgitation. - Pulmonary arteries: Systolic pressure could not be accurately estimated.     ASSESSMENT:    1. Chronic atrial fibrillation ([place])   2. Coronary artery disease involving native coronary artery of native heart without angina pectoris   3. Essential hypertension   4. S/P AAA repair   5. Hyperlipidemia, unspecified hyperlipidemia type      PLAN:  In order of problems listed above:  1. Chronic atrial fibrillation. Rate improved with discontinuation of diltiazem. Continue Eliquis. Continue current metoprolol dose.   2. CAD: s/p multiple interventions in the past- last in 2017. On Plavix and lopressor. On statin. Minimal angina.  3. Chronic diastolic CHF: volume status is good on current lasix dose. No edema and weight is stable.   4. Hyperlipidemia: On Lipitor 40 mg daily.  LDL 61  5. History of AAA repair: hasn't seen  Dr. Trula Fisher in over 2 years. Will arrange follow up.  6..  DM type 2 on metformin.  A1c- 7.6.   Follow up in 6 months with lab.    Medication Adjustments/Labs and Tests Ordered: Current medicines are reviewed at length with the patient today.  Concerns regarding medicines are outlined above.  Medication changes, Labs and  Tests ordered today are listed in the Patient Instructions below. There are no Patient Instructions on file for this visit.   Signed, Santia Labate Martinique, MD  01/05/2020 10:42 AM    [facility] [place], [place], [place]  [address] Phone: [phone]; Fax: [phone]

## 2020-01-05 ENCOUNTER — Encounter: Payer: Self-pay | Admitting: Cardiology

## 2020-01-05 ENCOUNTER — Ambulatory Visit: Payer: Medicare Other | Admitting: Cardiology

## 2020-01-05 ENCOUNTER — Other Ambulatory Visit: Payer: Self-pay

## 2020-01-05 VITALS — BP 130/84 | HR 78 | Temp 96.4°F | Ht 68.0 in | Wt 181.2 lb

## 2020-01-05 DIAGNOSIS — I10 Essential (primary) hypertension: Secondary | ICD-10-CM

## 2020-01-05 DIAGNOSIS — I482 Chronic atrial fibrillation, unspecified: Secondary | ICD-10-CM | POA: Diagnosis not present

## 2020-01-05 DIAGNOSIS — Z9889 Other specified postprocedural states: Secondary | ICD-10-CM | POA: Diagnosis not present

## 2020-01-05 DIAGNOSIS — I2510 Atherosclerotic heart disease of native coronary artery without angina pectoris: Secondary | ICD-10-CM

## 2020-01-05 DIAGNOSIS — E119 Type 2 diabetes mellitus without complications: Secondary | ICD-10-CM

## 2020-01-05 DIAGNOSIS — Z8679 Personal history of other diseases of the circulatory system: Secondary | ICD-10-CM

## 2020-01-05 DIAGNOSIS — E785 Hyperlipidemia, unspecified: Secondary | ICD-10-CM

## 2020-01-05 NOTE — Patient Instructions (Signed)
Medication Instructions:  Continue same medications *If you need a refill on your cardiac medications before your next appointment, please call your pharmacy*   Lab Work: Have bmet,lipid and hepatic panels,A1c done 1 week before June appointment    Testing/Procedures: None ordered   Follow-Up: At [facility], you and your health needs are our priority.  As part of our continuing mission to provide you with exceptional heart care, we have created designated Provider Care Teams.  These Care Teams include your primary Cardiologist (physician) and Advanced Practice Providers (APPs -  Physician Assistants and Nurse Practitioners) who all work together to provide you with the care you need, when you need it.  We recommend signing up for the patient portal called "MyChart".  Sign up information is provided on this After Visit Summary.  MyChart is used to connect with patients for Virtual Visits (Telemedicine).  Patients are able to view lab/test results, encounter notes, upcoming appointments, etc.  Non-urgent messages can be sent to your provider as well.   To learn more about what you can do with MyChart, go to [email or web address].    Your next appointment:  6 months   Call in Feb to schedule June appointment    The format for your next appointment: Office    Provider: Dr.Jordan     Schedule follow up appointment with Dr.Brabham

## 2020-01-16 ENCOUNTER — Other Ambulatory Visit: Payer: Self-pay

## 2020-01-16 DIAGNOSIS — I714 Abdominal aortic aneurysm, without rupture, unspecified: Secondary | ICD-10-CM

## 2020-01-29 ENCOUNTER — Other Ambulatory Visit: Payer: Self-pay

## 2020-01-29 ENCOUNTER — Ambulatory Visit (HOSPITAL_COMMUNITY)
Admission: RE | Admit: 2020-01-29 | Discharge: 2020-01-29 | Disposition: A | Discharge status: Home / Self Care | Payer: Medicare Other | Source: Ambulatory Visit | Attending: Physician Assistant | Admitting: Physician Assistant

## 2020-01-29 ENCOUNTER — Ambulatory Visit: Payer: Medicare Other | Admitting: Physician Assistant

## 2020-01-29 VITALS — BP 142/89 | HR 100 | Temp 98.0°F | Resp 20 | Ht 68.0 in | Wt 175.7 lb

## 2020-01-29 DIAGNOSIS — I714 Abdominal aortic aneurysm, without rupture, unspecified: Secondary | ICD-10-CM

## 2020-01-29 NOTE — Progress Notes (Signed)
Office Note     CC:  follow up Requesting Provider:  [name], [place]*  HPI: Jason Fisher is a 79 y.o. (Jun 27, 1940) male who presents status post endovascular abdominal aortic aneurysm repair on July 31, 2016.  His postoperative CT scan showed a type II endoleak which resolved on subsequent imaging.  He is allergic to iodinated diagnostic agents.  He denies sudden significant abdominal or back pain episodes.  No lower extremity claudication.  No prior history of stroke or TIA.  His wife accompanies him today.   Last follow-up office visit 10/2017:  Maximal sac diameter: 3.8 cm. Compliant with Lipitor, Plavix and Eliquis.  Hx of atrial fibrillation, CAD (s/p stents), chronic diastolic CHF, DM-2  (Z6X on 11/07/2019 = 6.6), CKD (Scr on 03/06/2019 = 1.39)  Maintained on Lasix, hydralizine, BB  Current tobacco use: He says he recently quit smoking but states "I have 1 every now and then"   Past Medical History:  Diagnosis Date  . 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 BENIGN PROSTATIC HYPERTROPHY, HX OF 02/24/2007  . CAD S/P PCI    a. 2007 Inf STEMI s/p Vision BMS  2 to RCA;  b. 2009 ISR Prox RCA Stent-->with DES;  c. 09/2015 PCI: LM nl, LAD 30p/m, D1 90, LCX nl, OM1 small, 80, OM2 90 (2.5x14 Biofreedom stent), OM3 small, RCA 30p ISR, 13m ISR, 53m, 85d, EF 50-55%.  . 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 Chronic atrial fibrillation ([place]) 11/06/2009   a. On Eliquis (CHA2DS2VASc = 5).  Rate controlled w/ BB and CCB.  Marland Kitchen Chronic diastolic CHF (congestive heart failure) ([place])    a. 09/2015 Echo: EF 55-60%, mildly dil LA.  . CKD (chronic kidney disease), stage III ([place])    they mentioned this to the patient but they would work on it later after the aneurysm  . COLONIC POLYPS, HX OF 11/02/2006  . COPD (chronic obstructive pulmonary disease) ([place])   . DIABETES MELLITUS, TYPE II 10/29/2006  . Diverticulosis   . History of tobacco abuse    a. Quit 09/2015.  Marland Kitchen HYPERLIPIDEMIA 10/29/2006   . Hypertensive heart disease with heart failure ([place])   . Pneumonia 09/2015   [name] 07/31/2016  . STEMI (ST elevation myocardial infarction) [facility]) 2007   [name] 07/31/2016    Past Surgical History:  Procedure Laterality Date  . ABDOMINAL AORTIC ENDOVASCULAR STENT GRAFT N/A 07/31/2016   Procedure: ABDOMINAL AORTIC ENDOVASCULAR STENT GRAFT;  Surgeon: Serafina Mitchell, MD;  Location: [employer];  Service: Vascular;  Laterality: N/A;  . CARDIAC CATHETERIZATION N/A 09/26/2015   Procedure: Left Heart Cath and Coronary Angiography;  Surgeon: Peter M Martinique, MD;  Location: [place] [facility];  Service: Cardiovascular;  Laterality: N/A;  . CARDIAC CATHETERIZATION N/A 09/26/2015   Procedure: Coronary Stent Intervention;  Surgeon: Peter M Martinique, MD;  Location: [place] [facility];  Service: Cardiovascular;  Laterality: N/A;  . CATARACT EXTRACTION Bilateral   . COLONOSCOPY    . CORONARY ANGIOPLASTY WITH STENT PLACEMENT  2007   Inferior STEMI 2007 - RCA PCI with 2 Vision BMS; Abnormal Myoview in 2009 - pRCA ins-stent restenosis & unstented disease - DES PCI Promus 3.0 x 20 mm (3.5 mm)  . ENDOVASCULAR REPAIR/STENT GRAFT  07/31/2016  . INCISION AND DRAINAGE PERIRECTAL ABSCESS N/A 11/06/2015   Procedure: IRRIGATION AND DEBRIDEMENT PERIRECTAL ABSCESS, POSSIBLE HEMORRHOIDECTOMY;  Surgeon: Coralie Keens, MD;  Location: [place];  Service: General;  Laterality: N/A;  . ORIF SHOULDER FRACTURE Right    "  fell out of back end of a truck"    Social History   Socioeconomic History  . Marital status: Married    Spouse name: Not on file  . Number of children: 5  . Years of education: Not on file  . Highest education level: Not on file  Occupational History  . Occupation: [employer]  Tobacco Use  . Smoking status: Current Some Day Smoker    Packs/day: 1.00    Years: 49.00    Pack years: 49.00    Types: Cigarettes    Last attempt to quit: 09/08/2015    Years since quitting: 4.3  . Smokeless tobacco: Never Used   . Tobacco comment: passive smoker as well  Vaping Use  . Vaping Use: Never used  Substance and Sexual Activity  . Alcohol use: No    Alcohol/week: 0.0 standard drinks  . Drug use: No  . Sexual activity: Not on file  Other Topics Concern  . Not on file  Social History Narrative  . Not on file   Social Determinants of Health   Financial Resource Strain: Low Risk   . Difficulty of Paying Living Expenses: Not hard at all  Food Insecurity: Not on file  Transportation Needs: Not on file  Physical Activity: Sufficiently Active  . Days of Exercise per Week: 3 days  . Minutes of Exercise per Session: 60 min  Stress: Not on file  Social Connections: Not on file  Intimate Partner Violence: Not on file   Family History  Problem Relation Age of Onset  . Heart attack Mother   . Diabetes Mother   . Heart attack Father   . Hypertension Father   . Heart attack Brother   . Diabetes Brother     Current Outpatient Medications  Medication Sig Dispense Refill  . ACCU-CHEK AVIVA PLUS test strip CHECK BLOOD SUGAR ONCE DAILY. DX E11.51 100 strip 2  . atorvastatin (LIPITOR) 40 MG tablet TAKE 1 TABLET BY MOUTH EVERY DAY 90 tablet 1  . clopidogrel (PLAVIX) 75 MG tablet TAKE 1 TABLET BY MOUTH EVERY DAY 90 tablet 3  . ELIQUIS 5 MG TABS tablet TAKE 1 TABLET BY MOUTH TWICE A DAY 60 tablet 4  . furosemide (LASIX) 80 MG tablet Take 1 tablet (80 mg total) by mouth daily. 180 tablet 1  . Glycopyrrolate-Formoterol (BEVESPI AEROSPHERE) 9-4.8 MCG/ACT AERO Inhale 2 puffs into the lungs 2 (two) times daily. 5.9 g 5  . hydrALAZINE (APRESOLINE) 25 MG tablet TAKE 1 TABLET BY MOUTH THREE TIMES A DAY 270 tablet 1  . levalbuterol (XOPENEX HFA) 45 MCG/ACT inhaler Inhale 1-2 puffs into the lungs every 6 (six) hours as needed for wheezing. 1 Inhaler 3  . metFORMIN (GLUCOPHAGE) 1000 MG tablet TAKE 1 TABLET TWICE DAILY WITH A MEAL 180 tablet 4  . metoprolol tartrate (LOPRESSOR) 100 MG tablet Take 1 tablet (100 mg  total) by mouth 2 (two) times daily. 180 tablet 3  . nitroGLYCERIN (NITROSTAT) 0.4 MG SL tablet Place 1 tablet (0.4 mg total) under the tongue every 5 (five) minutes as needed for chest pain. X 3 doses 25 tablet 4  . polyethylene glycol (MIRALAX / GLYCOLAX) packet Take 17 g by mouth daily as needed for mild constipation.    . TRESIBA FLEXTOUCH 100 UNIT/ML FlexTouch Pen INJECT 32 UNITS INTO THE SKIN AT BEDTIME. 15 mL 2   No current facility-administered medications for this visit.    Allergies  Allergen Reactions  . Contrast Media [Iodinated Diagnostic Agents]  Anaphylaxis     REVIEW OF SYSTEMS:   [X]  denotes positive finding, [ ]  denotes negative finding Cardiac  Comments:  Chest pain or chest pressure:    Shortness of breath upon exertion:    Short of breath when lying flat:    Irregular heart rhythm: x       Vascular    Pain in calf, thigh, or hip brought on by ambulation:    Pain in feet at night that wakes you up from your sleep:     Blood clot in your veins:    Leg swelling:         Pulmonary    Oxygen at home:    Productive cough:     Wheezing:         Neurologic    Sudden weakness in arms or legs:     Sudden numbness in arms or legs:     Sudden onset of difficulty speaking or slurred speech:    Temporary loss of vision in one eye:     Problems with dizziness:         Gastrointestinal    Blood in stool:     Vomited blood:         Genitourinary    Burning when urinating:     Blood in urine:        Psychiatric    Major depression:         Hematologic    Bleeding problems:    Problems with blood clotting too easily:        Skin    Rashes or ulcers:        Constitutional    Fever or chills:      PHYSICAL EXAMINATION:  Vitals:   01/29/20 0932  BP: (!) 142/89  Pulse: 100  Resp: 20  Temp: 98 F (36.7 C)  SpO2: 96%    General:  WDWN in NAD; vital signs documented above Gait: Unaided, no ataxia HENT: WNL, normocephalic Pulmonary: normal  non-labored breathing , without Rales, rhonchi,  wheezing Cardiac: irregular HR, without  Murmurs without carotid bruit Abdomen: soft, NT, no masses Skin: without rashes Vascular Exam/Pulses: Bilateral symmetrical 2+ radial pulses Musculoskeletal: no muscle wasting or atrophy  Neurologic: A&O X 3;  No focal weakness or paresthesias are detected Psychiatric:  The pt has Normal affect.   Non-Invasive Vascular Imaging:   01/29/2020  Endovascular Aortic Repair (EVAR):  +----------+----------------+-------------------+-------------------+       Diameter AP (cm)Diameter Trans (cm)Velocities (cm/sec)  +----------+----------------+-------------------+-------------------+  Aorta   3.87      4.37        55           +----------+----------------+-------------------+-------------------+  Right Limb1.43      1.57        175          +----------+----------------+-------------------+-------------------+  Left Limb 1.37      1.42        113           ASSESSMENT/PLAN:: 79 y.o. male here for follow up for AAA: Status post endovascular repair of a 5.0 cm infrarenal abdominal aortic aneurysm.  Ultrasound today shows increase in the sac measuring 4.37 in the transverse dimension as compared to 3.8 cm two years ago.    He is currently without symptoms referable to his aneurysm.  Due to the increase in maximal diameter and no prior study as of September 2019, will repeat abdominal aortic ultrasound in 6 months.  Advised him to  proceed to the emergency department for sudden, severe abdominal or back pain.   Milinda Antis, PA-C [facility] [phone]  Clinic MD:   Dr. Darrick Penna on call

## 2020-01-30 ENCOUNTER — Other Ambulatory Visit: Payer: Self-pay

## 2020-01-30 DIAGNOSIS — I714 Abdominal aortic aneurysm, without rupture, unspecified: Secondary | ICD-10-CM

## 2020-02-07 ENCOUNTER — Encounter: Payer: Self-pay | Admitting: Internal Medicine

## 2020-02-07 ENCOUNTER — Ambulatory Visit (INDEPENDENT_AMBULATORY_CARE_PROVIDER_SITE_OTHER): Payer: Medicare Other | Admitting: Internal Medicine

## 2020-02-07 ENCOUNTER — Other Ambulatory Visit: Payer: Self-pay

## 2020-02-07 VITALS — BP 130/86 | HR 91 | Temp 98.0°F | Wt 177.7 lb

## 2020-02-07 DIAGNOSIS — I2510 Atherosclerotic heart disease of native coronary artery without angina pectoris: Secondary | ICD-10-CM

## 2020-02-07 DIAGNOSIS — I5032 Chronic diastolic (congestive) heart failure: Secondary | ICD-10-CM

## 2020-02-07 DIAGNOSIS — E785 Hyperlipidemia, unspecified: Secondary | ICD-10-CM

## 2020-02-07 DIAGNOSIS — E1151 Type 2 diabetes mellitus with diabetic peripheral angiopathy without gangrene: Secondary | ICD-10-CM | POA: Diagnosis not present

## 2020-02-07 DIAGNOSIS — N1832 Chronic kidney disease, stage 3b: Secondary | ICD-10-CM | POA: Diagnosis not present

## 2020-02-07 DIAGNOSIS — Z9861 Coronary angioplasty status: Secondary | ICD-10-CM

## 2020-02-07 DIAGNOSIS — Z23 Encounter for immunization: Secondary | ICD-10-CM | POA: Diagnosis not present

## 2020-02-07 DIAGNOSIS — I482 Chronic atrial fibrillation, unspecified: Secondary | ICD-10-CM | POA: Diagnosis not present

## 2020-02-07 DIAGNOSIS — I10 Essential (primary) hypertension: Secondary | ICD-10-CM | POA: Diagnosis not present

## 2020-02-07 LAB — POCT GLYCOSYLATED HEMOGLOBIN (HGB A1C): Hemoglobin A1C: 7 % — AB (ref 4.0–5.6)

## 2020-02-07 NOTE — Addendum Note (Signed)
Addended by: Kern Reap B on: 02/07/2020 05:09 PM   Modules accepted: Orders

## 2020-02-07 NOTE — Progress Notes (Signed)
Established Patient Office Visit     This visit occurred during the SARS-CoV-2 public health emergency.  Safety protocols were in place, including screening questions prior to the visit, additional usage of staff PPE, and extensive cleaning of exam room while observing appropriate contact time as indicated for disinfecting solutions.    CC/Reason for Visit: 59-month follow-up chronic medical conditions  HPI: Jason Fisher is a 80 y.o. male who is coming in today for the above mentioned reasons. Past Medical History is significant for: Chronic diastolic heart failure, benign essential hypertension,AAA,type 2 diabetes, chronic atrial fibrillation, chronic kidney disease stage III.He sees me mainly for diabetic care.  He admits to some dietary indiscretions over the holidays.  Since I last saw him he had a visit with his vascular surgeon for his AAA, it had enlarged a little and he has been scheduled for 99-month ultrasound.  Otherwise he has been feeling well and has no acute complaints.  He is requesting his flu vaccine today.   Past Medical/Surgical History: Past Medical History:  Diagnosis Date  . 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 BENIGN PROSTATIC HYPERTROPHY, HX OF 02/24/2007  . CAD S/P PCI    a. 2007 Inf STEMI s/p Vision BMS  2 to RCA;  b. 2009 ISR Prox RCA Stent-->with DES;  c. 09/2015 PCI: LM nl, LAD 30p/m, D1 90, LCX nl, OM1 small, 80, OM2 90 (2.5x14 Biofreedom stent), OM3 small, RCA 30p ISR, 43m ISR, 40m, 85d, EF 50-55%.  . 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 Chronic atrial fibrillation (HCC) 11/06/2009   a. On Eliquis (CHA2DS2VASc = 5).  Rate controlled w/ BB and CCB.  Marland Kitchen Chronic diastolic CHF (congestive heart failure) (H[facility])    a. 09/2015 Echo: EF 55-60%, mildly dil LA.  . CKD (chronic kidney disease), stage III (H[facility])    they mentioned this to the patient but they would work on it later after the aneurysm  . COLONIC POLYPS, HX OF 11/02/2006  . COPD (chronic  obstructive pulmonary disease) (H[facility])   . DIABETES MELLITUS, TYPE II 10/29/2006  . Diverticulosis   . History of tobacco abuse    a. Quit 09/2015.  Marland Kitchen HYPERLIPIDEMIA 10/29/2006  . Hypertensive heart disease with heart failure (HCC)   . Pneumonia 09/2015   [name] 07/31/2016  . STEMI (ST elevation myocardial infarction) [facility]) 2007   [name] 07/31/2016    Past Surgical History:  Procedure Laterality Date  . ABDOMINAL AORTIC ENDOVASCULAR STENT GRAFT N/A 07/31/2016   Procedure: ABDOMINAL AORTIC ENDOVASCULAR STENT GRAFT;  Surgeon: Nada Libman, MD;  Location: [facility] OR;  Service: Vascular;  Laterality: N/A;  . CARDIAC CATHETERIZATION N/A 09/26/2015   Procedure: Left Heart Cath and Coronary Angiography;  Surgeon: Peter M Swaziland, MD;  Location: [facility] INVASIVE CV LAB;  Service: Cardiovascular;  Laterality: N/A;  . CARDIAC CATHETERIZATION N/A 09/26/2015   Procedure: Coronary Stent Intervention;  Surgeon: Peter M Swaziland, MD;  Location: [facility] INVASIVE CV LAB;  Service: Cardiovascular;  Laterality: N/A;  . CATARACT EXTRACTION Bilateral   . COLONOSCOPY    . CORONARY ANGIOPLASTY WITH STENT PLACEMENT  2007   Inferior STEMI 2007 - RCA PCI with 2 Vision BMS; Abnormal Myoview in 2009 - pRCA ins-stent restenosis & unstented disease - DES PCI Promus 3.0 x 20 mm (3.5 mm)  . ENDOVASCULAR REPAIR/STENT GRAFT  07/31/2016  . INCISION AND DRAINAGE PERIRECTAL ABSCESS N/A 11/06/2015   Procedure: IRRIGATION AND DEBRIDEMENT PERIRECTAL ABSCESS, POSSIBLE HEMORRHOIDECTOMY;  Surgeon: Abigail Miyamoto, MD;  Location: [facility] OR;  Service: General;  Laterality: N/A;  . ORIF SHOULDER FRACTURE Right    "fell out of back end of a truck"    Social History:  reports that he has been smoking cigarettes. He has a 49.00 pack-year smoking history. He has never used smokeless tobacco. He reports that he does not drink alcohol and does not use drugs.  Allergies: Allergies  Allergen Reactions  . Contrast Media [Iodinated Diagnostic Agents]  Anaphylaxis    Family History:  Family History  Problem Relation Age of Onset  . Heart attack Mother   . Diabetes Mother   . Heart attack Father   . Hypertension Father   . Heart attack Brother   . Diabetes Brother      Current Outpatient Medications:  .  ACCU-CHEK AVIVA PLUS test strip, CHECK BLOOD SUGAR ONCE DAILY. DX E11.51, Disp: 100 strip, Rfl: 2 .  atorvastatin (LIPITOR) 40 MG tablet, TAKE 1 TABLET BY MOUTH EVERY DAY, Disp: 90 tablet, Rfl: 1 .  clopidogrel (PLAVIX) 75 MG tablet, TAKE 1 TABLET BY MOUTH EVERY DAY, Disp: 90 tablet, Rfl: 3 .  ELIQUIS 5 MG TABS tablet, TAKE 1 TABLET BY MOUTH TWICE A DAY, Disp: 60 tablet, Rfl: 4 .  furosemide (LASIX) 80 MG tablet, Take 1 tablet (80 mg total) by mouth daily., Disp: 180 tablet, Rfl: 1 .  Glycopyrrolate-Formoterol (BEVESPI AEROSPHERE) 9-4.8 MCG/ACT AERO, Inhale 2 puffs into the lungs 2 (two) times daily., Disp: 5.9 g, Rfl: 5 .  hydrALAZINE (APRESOLINE) 25 MG tablet, TAKE 1 TABLET BY MOUTH THREE TIMES A DAY, Disp: 270 tablet, Rfl: 1 .  levalbuterol (XOPENEX HFA) 45 MCG/ACT inhaler, Inhale 1-2 puffs into the lungs every 6 (six) hours as needed for wheezing., Disp: 1 Inhaler, Rfl: 3 .  metFORMIN (GLUCOPHAGE) 1000 MG tablet, TAKE 1 TABLET TWICE DAILY WITH A MEAL, Disp: 180 tablet, Rfl: 4 .  metoprolol tartrate (LOPRESSOR) 100 MG tablet, Take 1 tablet (100 mg total) by mouth 2 (two) times daily., Disp: 180 tablet, Rfl: 3 .  nitroGLYCERIN (NITROSTAT) 0.4 MG SL tablet, Place 1 tablet (0.4 mg total) under the tongue every 5 (five) minutes as needed for chest pain. X 3 doses, Disp: 25 tablet, Rfl: 4 .  polyethylene glycol (MIRALAX / GLYCOLAX) packet, Take 17 g by mouth daily as needed for mild constipation., Disp: , Rfl:  .  TRESIBA FLEXTOUCH 100 UNIT/ML FlexTouch Pen, INJECT 32 UNITS INTO THE SKIN AT BEDTIME., Disp: 15 mL, Rfl: 2  Review of Systems:  Constitutional: Denies fever, chills, diaphoresis, appetite change and fatigue.  HEENT: Denies  photophobia, eye pain, redness, hearing loss, ear pain, congestion, sore throat, rhinorrhea, sneezing, mouth sores, trouble swallowing, neck pain, neck stiffness and tinnitus.   Respiratory: Denies SOB, DOE, cough, chest tightness,  and wheezing.   Cardiovascular: Denies chest pain, palpitations and leg swelling.  Gastrointestinal: Denies nausea, vomiting, abdominal pain, diarrhea, constipation, blood in stool and abdominal distention.  Genitourinary: Denies dysuria, urgency, frequency, hematuria, flank pain and difficulty urinating.  Endocrine: Denies: hot or cold intolerance, sweats, changes in hair or nails, polyuria, polydipsia. Musculoskeletal: Denies myalgias, back pain, joint swelling, arthralgias and gait problem.  Skin: Denies pallor, rash and wound.  Neurological: Denies dizziness, seizures, syncope, weakness, light-headedness, numbness and headaches.  Hematological: Denies adenopathy. Easy bruising, personal or family bleeding history  Psychiatric/Behavioral: Denies suicidal ideation, mood changes, confusion, nervousness, sleep disturbance and agitation    Physical Exam: Vitals:   02/07/20 1036  BP: 130/86  Pulse:  91  Temp: 98 F (36.7 C)  TempSrc: Oral  Weight: 177 lb 11.2 oz (80.6 kg)    Body mass index is 27.02 kg/m.   Constitutional: NAD, calm, comfortable Eyes: PERRL, lids and conjunctivae normal, wears corrective lenses ENMT: Mucous membranes are moist.  Respiratory: clear to auscultation bilaterally, no wheezing, no crackles. Normal respiratory effort. No accessory muscle use.  Cardiovascular: Regular rate and rhythm, no murmurs / rubs / gallops. No extremity edema.  Neurologic: Grossly intact and nonfocal Psychiatric: Normal judgment and insight. Alert and oriented x 3. Normal mood.    Impression and Plan:  DM (diabetes mellitus), type 2 with peripheral vascular complications (HCC) -123456 is not surprisingly little higher given dietary indiscretions but still  within goal at 7. -Continue Metformin, continue Tresiba 32 units. -He will work on lifestyle modifications until I see him next in 3 months.  Essential hypertension -Fair control.  Dyslipidemia -Last LDL was 61 in February 2021.  Chronic atrial fibrillation ([place]):  -Anticoagulated on Eliquis, followed by cardiology.  Stage 3b chronic kidney disease (HCC) -Baseline creatinine is around 1.390.  Chronic diastolic congestive heart failure (HCC) -Compensated, continue Lasix, statin, beta-blocker.  CAD S/P percutaneous coronary angioplasty -Stable, no chest pain, followed by cardiology last seen in December 2021.  Need for influenza vaccination -Flu vaccine administered today.   Patient Instructions  -Nice seeing you today!!  -Flu vaccine today.  -See you back in 3 months.     Lelon Frohlich, MD [facility]

## 2020-02-07 NOTE — Patient Instructions (Signed)
-  Nice seeing you today!!  -Flu vaccine today.  -See you back in 3 months.

## 2020-04-29 ENCOUNTER — Other Ambulatory Visit: Payer: Self-pay | Admitting: Internal Medicine

## 2020-04-29 DIAGNOSIS — E1151 Type 2 diabetes mellitus with diabetic peripheral angiopathy without gangrene: Secondary | ICD-10-CM

## 2020-05-07 ENCOUNTER — Ambulatory Visit: Payer: Medicare Other | Admitting: Internal Medicine

## 2020-05-09 ENCOUNTER — Other Ambulatory Visit: Payer: Self-pay

## 2020-05-09 ENCOUNTER — Other Ambulatory Visit: Payer: Self-pay | Admitting: Internal Medicine

## 2020-05-09 DIAGNOSIS — E785 Hyperlipidemia, unspecified: Secondary | ICD-10-CM

## 2020-05-10 ENCOUNTER — Encounter (HOSPITAL_COMMUNITY): Payer: Self-pay | Admitting: Emergency Medicine

## 2020-05-10 ENCOUNTER — Other Ambulatory Visit: Payer: Self-pay

## 2020-05-10 ENCOUNTER — Inpatient Hospital Stay (HOSPITAL_COMMUNITY)
Admission: EM | Admit: 2020-05-10 | Discharge: 2020-05-12 | DRG: 291 | Disposition: A | Discharge status: Home / Self Care | Payer: Medicare Other | Attending: Internal Medicine | Admitting: Internal Medicine

## 2020-05-10 ENCOUNTER — Emergency Department (HOSPITAL_COMMUNITY): Payer: Medicare Other

## 2020-05-10 ENCOUNTER — Encounter: Payer: Self-pay | Admitting: Internal Medicine

## 2020-05-10 ENCOUNTER — Ambulatory Visit (INDEPENDENT_AMBULATORY_CARE_PROVIDER_SITE_OTHER): Payer: Medicare Other | Admitting: Internal Medicine

## 2020-05-10 VITALS — BP 164/110 | HR 91 | Temp 97.6°F | Wt 177.8 lb

## 2020-05-10 DIAGNOSIS — R7989 Other specified abnormal findings of blood chemistry: Secondary | ICD-10-CM

## 2020-05-10 DIAGNOSIS — I2510 Atherosclerotic heart disease of native coronary artery without angina pectoris: Secondary | ICD-10-CM | POA: Diagnosis present

## 2020-05-10 DIAGNOSIS — Z9861 Coronary angioplasty status: Secondary | ICD-10-CM

## 2020-05-10 DIAGNOSIS — E1169 Type 2 diabetes mellitus with other specified complication: Secondary | ICD-10-CM

## 2020-05-10 DIAGNOSIS — R0600 Dyspnea, unspecified: Secondary | ICD-10-CM | POA: Diagnosis not present

## 2020-05-10 DIAGNOSIS — Z20822 Contact with and (suspected) exposure to covid-19: Secondary | ICD-10-CM | POA: Diagnosis not present

## 2020-05-10 DIAGNOSIS — R0602 Shortness of breath: Secondary | ICD-10-CM | POA: Diagnosis not present

## 2020-05-10 DIAGNOSIS — N1831 Chronic kidney disease, stage 3a: Secondary | ICD-10-CM | POA: Diagnosis not present

## 2020-05-10 DIAGNOSIS — I509 Heart failure, unspecified: Secondary | ICD-10-CM

## 2020-05-10 DIAGNOSIS — I10 Essential (primary) hypertension: Secondary | ICD-10-CM | POA: Diagnosis not present

## 2020-05-10 DIAGNOSIS — Z7984 Long term (current) use of oral hypoglycemic drugs: Secondary | ICD-10-CM | POA: Diagnosis not present

## 2020-05-10 DIAGNOSIS — J90 Pleural effusion, not elsewhere classified: Secondary | ICD-10-CM | POA: Diagnosis not present

## 2020-05-10 DIAGNOSIS — Z794 Long term (current) use of insulin: Secondary | ICD-10-CM | POA: Diagnosis not present

## 2020-05-10 DIAGNOSIS — I248 Other forms of acute ischemic heart disease: Secondary | ICD-10-CM | POA: Diagnosis present

## 2020-05-10 DIAGNOSIS — Z79899 Other long term (current) drug therapy: Secondary | ICD-10-CM

## 2020-05-10 DIAGNOSIS — R9431 Abnormal electrocardiogram [ECG] [EKG]: Secondary | ICD-10-CM | POA: Diagnosis not present

## 2020-05-10 DIAGNOSIS — Z91041 Radiographic dye allergy status: Secondary | ICD-10-CM

## 2020-05-10 DIAGNOSIS — I160 Hypertensive urgency: Secondary | ICD-10-CM | POA: Diagnosis present

## 2020-05-10 DIAGNOSIS — Z833 Family history of diabetes mellitus: Secondary | ICD-10-CM | POA: Diagnosis not present

## 2020-05-10 DIAGNOSIS — Z7902 Long term (current) use of antithrombotics/antiplatelets: Secondary | ICD-10-CM | POA: Diagnosis not present

## 2020-05-10 DIAGNOSIS — E876 Hypokalemia: Secondary | ICD-10-CM | POA: Diagnosis not present

## 2020-05-10 DIAGNOSIS — E1165 Type 2 diabetes mellitus with hyperglycemia: Secondary | ICD-10-CM | POA: Diagnosis not present

## 2020-05-10 DIAGNOSIS — Z743 Need for continuous supervision: Secondary | ICD-10-CM | POA: Diagnosis not present

## 2020-05-10 DIAGNOSIS — R0609 Other forms of dyspnea: Secondary | ICD-10-CM

## 2020-05-10 DIAGNOSIS — E1151 Type 2 diabetes mellitus with diabetic peripheral angiopathy without gangrene: Secondary | ICD-10-CM

## 2020-05-10 DIAGNOSIS — I130 Hypertensive heart and chronic kidney disease with heart failure and stage 1 through stage 4 chronic kidney disease, or unspecified chronic kidney disease: Principal | ICD-10-CM | POA: Diagnosis present

## 2020-05-10 DIAGNOSIS — E785 Hyperlipidemia, unspecified: Secondary | ICD-10-CM | POA: Diagnosis present

## 2020-05-10 DIAGNOSIS — Z7901 Long term (current) use of anticoagulants: Secondary | ICD-10-CM | POA: Diagnosis not present

## 2020-05-10 DIAGNOSIS — F17210 Nicotine dependence, cigarettes, uncomplicated: Secondary | ICD-10-CM | POA: Diagnosis not present

## 2020-05-10 DIAGNOSIS — J449 Chronic obstructive pulmonary disease, unspecified: Secondary | ICD-10-CM | POA: Diagnosis not present

## 2020-05-10 DIAGNOSIS — I5033 Acute on chronic diastolic (congestive) heart failure: Secondary | ICD-10-CM | POA: Diagnosis present

## 2020-05-10 DIAGNOSIS — I252 Old myocardial infarction: Secondary | ICD-10-CM | POA: Diagnosis not present

## 2020-05-10 DIAGNOSIS — Z8249 Family history of ischemic heart disease and other diseases of the circulatory system: Secondary | ICD-10-CM | POA: Diagnosis not present

## 2020-05-10 DIAGNOSIS — I517 Cardiomegaly: Secondary | ICD-10-CM | POA: Diagnosis not present

## 2020-05-10 DIAGNOSIS — N183 Chronic kidney disease, stage 3 unspecified: Secondary | ICD-10-CM | POA: Diagnosis present

## 2020-05-10 DIAGNOSIS — D631 Anemia in chronic kidney disease: Secondary | ICD-10-CM | POA: Diagnosis present

## 2020-05-10 DIAGNOSIS — I482 Chronic atrial fibrillation, unspecified: Secondary | ICD-10-CM | POA: Diagnosis present

## 2020-05-10 DIAGNOSIS — I4891 Unspecified atrial fibrillation: Secondary | ICD-10-CM | POA: Diagnosis not present

## 2020-05-10 DIAGNOSIS — Z8679 Personal history of other diseases of the circulatory system: Secondary | ICD-10-CM

## 2020-05-10 DIAGNOSIS — E1122 Type 2 diabetes mellitus with diabetic chronic kidney disease: Secondary | ICD-10-CM | POA: Diagnosis present

## 2020-05-10 DIAGNOSIS — I5043 Acute on chronic combined systolic (congestive) and diastolic (congestive) heart failure: Secondary | ICD-10-CM | POA: Diagnosis not present

## 2020-05-10 HISTORY — DX: Essential (primary) hypertension: I10

## 2020-05-10 LAB — CBC WITH DIFFERENTIAL/PLATELET
Abs Immature Granulocytes: 0.02 10*3/uL (ref 0.00–0.07)
Basophils Absolute: 0.1 10*3/uL (ref 0.0–0.1)
Basophils Relative: 1 %
Eosinophils Absolute: 0.1 10*3/uL (ref 0.0–0.5)
Eosinophils Relative: 2 %
HCT: 37.6 % — ABNORMAL LOW (ref 39.0–52.0)
Hemoglobin: 12 g/dL — ABNORMAL LOW (ref 13.0–17.0)
Immature Granulocytes: 0 %
Lymphocytes Relative: 21 %
Lymphs Abs: 1.9 10*3/uL (ref 0.7–4.0)
MCH: 28.5 pg (ref 26.0–34.0)
MCHC: 31.9 g/dL (ref 30.0–36.0)
MCV: 89.3 fL (ref 80.0–100.0)
Monocytes Absolute: 0.6 10*3/uL (ref 0.1–1.0)
Monocytes Relative: 7 %
Neutro Abs: 6.4 10*3/uL (ref 1.7–7.7)
Neutrophils Relative %: 69 %
Platelets: 168 10*3/uL (ref 150–400)
RBC: 4.21 MIL/uL — ABNORMAL LOW (ref 4.22–5.81)
RDW: 14.5 % (ref 11.5–15.5)
WBC: 9.1 10*3/uL (ref 4.0–10.5)
nRBC: 0 % (ref 0.0–0.2)

## 2020-05-10 LAB — POCT GLYCOSYLATED HEMOGLOBIN (HGB A1C): Hemoglobin A1C: 6.4 % — AB (ref 4.0–5.6)

## 2020-05-10 MED ORDER — ALBUTEROL SULFATE HFA 108 (90 BASE) MCG/ACT IN AERS
2.0000 | INHALATION_SPRAY | RESPIRATORY_TRACT | Status: DC | PRN
  Administered 2020-05-10: 2 via RESPIRATORY_TRACT
  Filled 2020-05-10: qty 6.7

## 2020-05-10 MED ORDER — ALBUTEROL SULFATE HFA 108 (90 BASE) MCG/ACT IN AERS: 2.0000 | INHALATION_SPRAY | RESPIRATORY_TRACT | Status: DC

## 2020-05-10 NOTE — Patient Instructions (Signed)
-  Nice seeing you today!!  -Make sure you are taking: metoprolol 100 mg twice daily, hydralazine 25 mg three times daily and lasix 80 mg daily.  -Check your blood pressure once a day and write it down. Bring these numbers in to your next visit.  -Schedule a follow up in 2 weeks.

## 2020-05-10 NOTE — ED Provider Notes (Addendum)
[facility] EMERGENCY DEPARTMENT Provider Note   CSN: [id number] Arrival date & time: 05/10/20  2049     History Chief Complaint  Patient presents with  . Hypertension    Jason Fisher is a 80 y.o. male.  HPI    80 year old male with history of CAD, A. fib on Eliquis, CHF, CKD, AAA comes in a chief complaint of shortness of breath.  Patient reports that he has been having some exertional shortness of breath that has worsened over the last several days.  He saw his PCP and they noted that he had elevated blood pressure, he was advised to return to the clinic in 2 weeks.  After going home, patient shortness of breath was worsening so he called EMS.  His O2 sats were 91% and he was placed on 4 L of oxygen.  Review of system is positive for cough with yellow sputum.  Patient has no chest pain.  He also denies any fevers, chills.  Past Medical History:  Diagnosis Date  . 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 BENIGN PROSTATIC HYPERTROPHY, HX OF 02/24/2007  . CAD S/P PCI    a. 2007 Inf STEMI s/p Vision BMS  2 to RCA;  b. 2009 ISR Prox RCA Stent-->with DES;  c. 09/2015 PCI: LM nl, LAD 30p/m, D1 90, LCX nl, OM1 small, 80, OM2 90 (2.5x14 Biofreedom stent), OM3 small, RCA 30p ISR, 56m ISR, 53m, 85d, EF 50-55%.  . 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 Chronic atrial fibrillation ([place]) 11/06/2009   a. On Eliquis (CHA2DS2VASc = 5).  Rate controlled w/ BB and CCB.  Marland Kitchen Chronic diastolic CHF (congestive heart failure) ([place])    a. 09/2015 Echo: EF 55-60%, mildly dil LA.  . CKD (chronic kidney disease), stage III ([place])    they mentioned this to the patient but they would work on it later after the aneurysm  . COLONIC POLYPS, HX OF 11/02/2006  . COPD (chronic obstructive pulmonary disease) ([place])   . DIABETES MELLITUS, TYPE II 10/29/2006  . Diverticulosis   . History of tobacco abuse    a. Quit 09/2015.  Marland Kitchen HYPERLIPIDEMIA 10/29/2006  . Hypertensive heart disease with heart failure ([place])   .  Pneumonia 09/2015   [name] 07/31/2016  . STEMI (ST elevation myocardial infarction) [facility]) 2007   [name] 07/31/2016    Patient Active Problem List   Diagnosis Date Noted  . Elevated troponin 04/22/2016  . COPD (chronic obstructive pulmonary disease) ([place]) 02/07/2016  . Abdominal aortic aneurysm (AAA) without rupture ([place])   . Perirectal abscess 11/04/2015  . Pulmonary nodules 11/04/2015  . Coronary artery disease of bypass graft of native heart with stable angina pectoris ([place])   . Allergic reaction to contrast dye 09/27/2015  . Accelerated hypertension with diastolic congestive heart failure, NYHA class 3 ([place]) 09/27/2015  . CHF (congestive heart failure) ([place]) 09/25/2015  . CKD (chronic kidney disease), stage III ([place])   . Acute on chronic combined systolic and diastolic congestive heart failure ([place])   . Diastolic congestive heart failure ([place])   . Paroxysmal atrial fibrillation (HCC)   . Angiodysplasia of colon 04/19/2015  . TOBACCO USER 04/17/2010  . Chronic atrial fibrillation ([place]): CHA2DS2Vasc = 5. On Warfarin. Rate control 11/06/2009  . Occlusion and stenosis of carotid artery without mention of cerebral infarction 07/13/2008  . Abdominal aortic aneurysm ([place]) 07/13/2008  . BENIGN PROSTATIC HYPERTROPHY, HX OF 02/24/2007  . MYOCARDIAL INFARCTION, HX OF 11/02/2006  . CAD S/P percutaneous coronary angioplasty  11/02/2006  . History of colonic polyps 11/02/2006  . Dyslipidemia 10/29/2006  . Essential hypertension 10/29/2006    Past Surgical History:  Procedure Laterality Date  . ABDOMINAL AORTIC ENDOVASCULAR STENT GRAFT N/A 07/31/2016   Procedure: ABDOMINAL AORTIC ENDOVASCULAR STENT GRAFT;  Surgeon: Serafina Mitchell, MD;  Location: [place];  Service: Vascular;  Laterality: N/A;  . CARDIAC CATHETERIZATION N/A 09/26/2015   Procedure: Left Heart Cath and Coronary Angiography;  Surgeon: Peter M Martinique, MD;  Location: [place] CV LAB;  Service: Cardiovascular;  Laterality: N/A;  .  CARDIAC CATHETERIZATION N/A 09/26/2015   Procedure: Coronary Stent Intervention;  Surgeon: Peter M Martinique, MD;  Location: [place] CV LAB;  Service: Cardiovascular;  Laterality: N/A;  . CATARACT EXTRACTION Bilateral   . COLONOSCOPY    . CORONARY ANGIOPLASTY WITH STENT PLACEMENT  2007   Inferior STEMI 2007 - RCA PCI with 2 Vision BMS; Abnormal Myoview in 2009 - pRCA ins-stent restenosis & unstented disease - DES PCI Promus 3.0 x 20 mm (3.5 mm)  . ENDOVASCULAR REPAIR/STENT GRAFT  07/31/2016  . INCISION AND DRAINAGE PERIRECTAL ABSCESS N/A 11/06/2015   Procedure: IRRIGATION AND DEBRIDEMENT PERIRECTAL ABSCESS, POSSIBLE HEMORRHOIDECTOMY;  Surgeon: Coralie Keens, MD;  Location: [place];  Service: General;  Laterality: N/A;  . ORIF SHOULDER FRACTURE Right    "fell out of back end of a truck"       Family History  Problem Relation Age of Onset  . Heart attack Mother   . Diabetes Mother   . Heart attack Father   . Hypertension Father   . Heart attack Brother   . Diabetes Brother     Social History   Tobacco Use  . Smoking status: Current Some Day Smoker    Packs/day: 1.00    Years: 49.00    Pack years: 49.00    Types: Cigarettes    Last attempt to quit: 09/08/2015    Years since quitting: 4.6  . Smokeless tobacco: Never Used  . Tobacco comment: passive smoker as well  Vaping Use  . Vaping Use: Never used  Substance Use Topics  . Alcohol use: No    Alcohol/week: 0.0 standard drinks  . Drug use: No    Home Medications Prior to Admission medications   Medication Sig Start Date End Date Taking? Authorizing Provider  ACCU-CHEK AVIVA PLUS test strip CHECK BLOOD SUGAR ONCE DAILY. DX E11.51 06/29/19   [name], [name], MD  atorvastatin (LIPITOR) 40 MG tablet TAKE 1 TABLET BY MOUTH EVERY DAY 05/09/20   [name], [name], MD  clopidogrel (PLAVIX) 75 MG tablet TAKE 1 TABLET BY MOUTH EVERY DAY 11/20/19   [name], [name], MD  ELIQUIS 5 MG TABS tablet TAKE 1  TABLET BY MOUTH TWICE A DAY 09/04/19   Martinique, Peter M, MD  furosemide (LASIX) 80 MG tablet Take 1 tablet (80 mg total) by mouth daily. 11/07/19   [name], [name], MD  Glycopyrrolate-Formoterol (BEVESPI AEROSPHERE) 9-4.8 MCG/ACT AERO Inhale 2 puffs into the lungs 2 (two) times daily. 07/13/19   Collene Gobble, MD  hydrALAZINE (APRESOLINE) 25 MG tablet TAKE 1 TABLET BY MOUTH THREE TIMES A DAY 11/18/19   [name], [name], MD  levalbuterol [facility] HFA) 45 MCG/ACT inhaler Inhale 1-2 puffs into the lungs every 6 (six) hours as needed for wheezing. 06/01/19   Collene Gobble, MD  metFORMIN (GLUCOPHAGE) 1000 MG tablet TAKE 1 TABLET TWICE DAILY WITH A MEAL 11/03/19   Jerilee Hoh  Everardo Beals, MD  metoprolol tartrate (LOPRESSOR) 100 MG tablet Take 1 tablet (100 mg total) by mouth 2 (two) times daily. 11/01/19   Martinique, Peter M, MD  nitroGLYCERIN (NITROSTAT) 0.4 MG SL tablet Place 1 tablet (0.4 mg total) under the tongue every 5 (five) minutes as needed for chest pain. X 3 doses 10/25/18   [name], [name], MD  polyethylene glycol [facility]) packet Take 17 g by mouth daily as needed for mild constipation.    [provider]  TRESIBA FLEXTOUCH 100 UNIT/ML FlexTouch Pen INJECT 32 UNITS INTO THE SKIN AT BEDTIME. 04/30/20   [name], [name], MD    Allergies    Contrast media [iodinated diagnostic agents]  Review of Systems   Review of Systems  Constitutional: Positive for activity change.  Respiratory: Positive for cough and shortness of breath.   Cardiovascular: Negative for chest pain.  Gastrointestinal: Negative for nausea and vomiting.  Hematological: Bruises/bleeds easily.  All other systems reviewed and are negative.   Physical Exam Updated Vital Signs BP (!) 197/147   Pulse (!) 106   Temp 98.4 F (36.9 C) (Oral)   Resp (!) 29   Ht 5\' 8"  (1.727 m)   Wt 81 kg   SpO2 100%   BMI 27.15 kg/m   Physical Exam Vitals and nursing note  reviewed.  Constitutional:      Appearance: He is well-developed.  HENT:     Head: Atraumatic.  Cardiovascular:     Rate and Rhythm: Normal rate.  Pulmonary:     Effort: Pulmonary effort is normal. No respiratory distress.     Breath sounds: No wheezing or rales.  Musculoskeletal:     Cervical back: Neck supple.     Right lower leg: No edema.     Left lower leg: No edema.  Skin:    General: Skin is warm.  Neurological:     Mental Status: He is alert and oriented to person, place, and time.     ED Results / Procedures / Treatments   Labs (all labs ordered are listed, but only abnormal results are displayed) Labs Reviewed  CBC WITH DIFFERENTIAL/PLATELET - Abnormal; Notable for the following components:      Result Value   RBC 4.21 (*)    Hemoglobin 12.0 (*)    HCT 37.6 (*)    All other components within normal limits  RESP PANEL BY RT-PCR (FLU A&B, COVID) ARPGX2  COMPREHENSIVE METABOLIC PANEL  BRAIN NATRIURETIC PEPTIDE  TROPONIN I (HIGH SENSITIVITY)    EKG EKG Interpretation  Date/Time:  Friday May 10 2020 20:56:41 EDT Ventricular Rate:  97 PR Interval:    QRS Duration: 90 QT Interval:  388 QTC Calculation: 493 R Axis:   56 Text Interpretation: Atrial fibrillation Borderline repol abnrm, anterolateral leads Borderline prolonged QT interval No acute changes No significant change since last tracing Confirmed by Varney Biles ([id number]) on 05/10/2020 11:24:33 PM   Radiology DG Chest Portable 1 View  Result Date: 05/10/2020 CLINICAL DATA:  Shortness of breath.  Current smoker. EXAM: PORTABLE CHEST 1 VIEW COMPARISON:  Most recent radiograph 10/30/2019. FINDINGS: Mild cardiomegaly. Aortic atherosclerosis. The lungs are hyperinflated. Blunting of the costophrenic angles, left greater than right. Findings suspicious for small left pleural effusion. Blunting of right costophrenic angles felt to be related to hyperinflation. Increased bronchial thickening from prior. No  pneumothorax. Stable osseous structures. IMPRESSION: 1. Increased peribronchial thickening from prior exam. This may be bronchitic or congestive. 2. Cardiomegaly.  Left pleural effusion. Blunting of right costophrenic angles felt to be related to hyperinflation rather than effusion. 3. Chronic hyperinflation. Aortic Atherosclerosis (ICD10-I70.0). Electronically Signed   By: Keith Rake M.D.   On: 05/10/2020 21:23    Procedures .Critical Care Performed by: Varney Biles, MD Authorized by: Varney Biles, MD   Critical care provider statement:    Critical care time (minutes):  32   Critical care was necessary to treat or prevent imminent or life-threatening deterioration of the following conditions:  Respiratory failure   Critical care was time spent personally by me on the following activities:  Discussions with consultants, evaluation of patient's response to treatment, examination of patient, ordering and performing treatments and interventions, ordering and review of laboratory studies, ordering and review of radiographic studies, pulse oximetry, re-evaluation of patient's condition, obtaining history from patient or surrogate and review of old charts     Medications Ordered in ED Medications  albuterol (VENTOLIN HFA) 108 (90 Base) MCG/ACT inhaler 2 puff (2 puffs Inhalation Given 05/10/20 2330)  albuterol (VENTOLIN HFA) 108 (90 Base) MCG/ACT inhaler 2 puff (has no administration in time range)    ED Course  I have reviewed the triage vital signs and the nursing notes.  Pertinent labs & imaging results that were available during my care of the patient were reviewed by me and considered in my medical decision making (see chart for details).    MDM Rules/Calculators/A&P                          80 year old comes in a chief complaint of shortness of breath.  He has history of CAD, CHF, CKD and A. Fib. Patient is noted to be hypertensive, but there is no clear evidence of overt  volume overload. He has a cough, yellow phlegm but there is no fevers, pleuritic chest pain, and the cough is not new.  Patient does not have any underlying lung disease.  Based on history and exam, initial thought is that patient might be having CHF/pulmonary edema, pneumonia, ACS.  It seems less likely that patient has COPD/bronchitis but we will order inhaler treatment to see if there is any benefit. Patient is not hypoxic, but he is feeling a lot better when oxygen is given.  He has baseline orthopnea.  -Plan is to get basic labs and chest x-ray -Anticipate admission at this time.  11:47 PM Chest x-ray reviewed independently.  There is mild pleural effusion on the right side and some evidence of peribronchial thickening.  Bronchitis versus pulmonary edema.  Labs are still pending including BNP  I think patient will need admission to the hospital.  I just reassessed him and I turned off his oxygen, his O2 sats dropped to about 93% but he started having visible air hunger. I still do not have any focal consolidation on lung exam -no hesitant at starting antibiotics at this time.  Patient's care will be signed out to incoming team.  If patient's labs are reassuring, then we will proceed with CT angiogram to get better morphology of the pulmonary process. Although - it is ok to be conservative and treat like COPD exacerbation and get Ct only in not responding. I have ordered nebs. EDP will reassess and decide the pathway.    Final Clinical Impression(s) / ED Diagnoses Final diagnoses:  Dyspnea on exertion    Rx / DC Orders ED Discharge Orders    None  Varney Biles, MD 05/10/20 2352

## 2020-05-10 NOTE — ED Provider Notes (Signed)
11:44 PM Assumed care from Dr. Kathrynn Humble, please see their note for full history, physical and decision making until this point. In brief this is a 80 y.o. year old male who presented to the ED tonight with Hypertension     Here with DOE and HTN. Plan for follow up labs.   Labs can consistent with likely fluid overload.  Patient was given IV Lasix.  Patient states some improvement with oxygen and breathing treatment.  Still hypoxic and tachypneic even at rest.  Plan for hospitalist admission.  Discharge instructions, including strict return precautions for new or worsening symptoms, given. Patient and/or family verbalized understanding and agreement with the plan as described.   Labs, studies and imaging reviewed by myself and considered in medical decision making if ordered. Imaging interpreted by radiology.  Labs Reviewed  CBC WITH DIFFERENTIAL/PLATELET - Abnormal; Notable for the following components:      Result Value   RBC 4.21 (*)    Hemoglobin 12.0 (*)    HCT 37.6 (*)    All other components within normal limits  RESP PANEL BY RT-PCR (FLU A&B, COVID) ARPGX2  SARS CORONAVIRUS 2 (TAT 6-24 HRS)  COMPREHENSIVE METABOLIC PANEL  BRAIN NATRIURETIC PEPTIDE  TROPONIN I (HIGH SENSITIVITY)    DG Chest Portable 1 View  Final Result      No follow-ups on file.    Jason Fisher, Jason Cornea, MD 05/11/20 [phone]

## 2020-05-10 NOTE — Progress Notes (Signed)
Established Patient Office Visit     This visit occurred during the SARS-CoV-2 public health emergency.  Safety protocols were in place, including screening questions prior to the visit, additional usage of staff PPE, and extensive cleaning of exam room while observing appropriate contact time as indicated for disinfecting solutions.    CC/Reason for Visit: 66-month follow-up chronic medical conditions  HPI: Jason Fisher is a 80 y.o. male who is coming in today for the above mentioned reasons. Past Medical History is significant for: Chronic diastolic heart failure, benign essential hypertension,AAA,type 2 diabetes, chronic atrial fibrillation, chronic kidney disease stage III.He sees me mainly for diabetic care.  He feels he has been doing well since I last saw him.  Unfortunately he has had very elevated blood pressure measurements in office today which is not normal for him.  3 separate measurements have been 200/120, 180/110, 164/110.  He states he is compliant with all his medications, however he is not able to name the medications that he is on.  He denies headache, chest pain, shortness of breath, focal neurologic deficit.   Past Medical/Surgical History: Past Medical History:  Diagnosis Date  . 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 BENIGN PROSTATIC HYPERTROPHY, HX OF 02/24/2007  . CAD S/P PCI    a. 2007 Inf STEMI s/p Vision BMS  2 to RCA;  b. 2009 ISR Prox RCA Stent-->with DES;  c. 09/2015 PCI: LM nl, LAD 30p/m, D1 90, LCX nl, OM1 small, 80, OM2 90 (2.5x14 Biofreedom stent), OM3 small, RCA 30p ISR, 42m ISR, 73m, 85d, EF 50-55%.  . 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 Chronic atrial fibrillation ([place]) 11/06/2009   a. On Eliquis (CHA2DS2VASc = 5).  Rate controlled w/ BB and CCB.  Marland Kitchen Chronic diastolic CHF (congestive heart failure) ([place])    a. 09/2015 Echo: EF 55-60%, mildly dil LA.  . CKD (chronic kidney disease), stage III ([place])    they mentioned this to the patient but  they would work on it later after the aneurysm  . COLONIC POLYPS, HX OF 11/02/2006  . COPD (chronic obstructive pulmonary disease) ([place])   . DIABETES MELLITUS, TYPE II 10/29/2006  . Diverticulosis   . History of tobacco abuse    a. Quit 09/2015.  Marland Kitchen HYPERLIPIDEMIA 10/29/2006  . Hypertensive heart disease with heart failure ([place])   . Pneumonia 09/2015   [name] 07/31/2016  . STEMI (ST elevation myocardial infarction) [facility]) 2007   [name] 07/31/2016    Past Surgical History:  Procedure Laterality Date  . ABDOMINAL AORTIC ENDOVASCULAR STENT GRAFT N/A 07/31/2016   Procedure: ABDOMINAL AORTIC ENDOVASCULAR STENT GRAFT;  Surgeon: Serafina Mitchell, MD;  Location: [place];  Service: Vascular;  Laterality: N/A;  . CARDIAC CATHETERIZATION N/A 09/26/2015   Procedure: Left Heart Cath and Coronary Angiography;  Surgeon: Peter M Martinique, MD;  Location: [place] CV LAB;  Service: Cardiovascular;  Laterality: N/A;  . CARDIAC CATHETERIZATION N/A 09/26/2015   Procedure: Coronary Stent Intervention;  Surgeon: Peter M Martinique, MD;  Location: [place] CV LAB;  Service: Cardiovascular;  Laterality: N/A;  . CATARACT EXTRACTION Bilateral   . COLONOSCOPY    . CORONARY ANGIOPLASTY WITH STENT PLACEMENT  2007   Inferior STEMI 2007 - RCA PCI with 2 Vision BMS; Abnormal Myoview in 2009 - pRCA ins-stent restenosis & unstented disease - DES PCI Promus 3.0 x 20 mm (3.5 mm)  . ENDOVASCULAR REPAIR/STENT GRAFT  07/31/2016  . INCISION AND DRAINAGE PERIRECTAL ABSCESS N/A  11/06/2015   Procedure: IRRIGATION AND DEBRIDEMENT PERIRECTAL ABSCESS, POSSIBLE HEMORRHOIDECTOMY;  Surgeon: Coralie Keens, MD;  Location: [place];  Service: General;  Laterality: N/A;  . ORIF SHOULDER FRACTURE Right    "fell out of back end of a truck"    Social History:  reports that he has been smoking cigarettes. He has a 49.00 pack-year smoking history. He has never used smokeless tobacco. He reports that he does not drink alcohol and does not use  drugs.  Allergies: Allergies  Allergen Reactions  . Contrast Media [Iodinated Diagnostic Agents] Anaphylaxis    Family History:  Family History  Problem Relation Age of Onset  . Heart attack Mother   . Diabetes Mother   . Heart attack Father   . Hypertension Father   . Heart attack Brother   . Diabetes Brother      Current Outpatient Medications:  .  ACCU-CHEK AVIVA PLUS test strip, CHECK BLOOD SUGAR ONCE DAILY. DX E11.51, Disp: 100 strip, Rfl: 2 .  atorvastatin (LIPITOR) 40 MG tablet, TAKE 1 TABLET BY MOUTH EVERY DAY, Disp: 90 tablet, Rfl: 1 .  clopidogrel (PLAVIX) 75 MG tablet, TAKE 1 TABLET BY MOUTH EVERY DAY, Disp: 90 tablet, Rfl: 3 .  ELIQUIS 5 MG TABS tablet, TAKE 1 TABLET BY MOUTH TWICE A DAY, Disp: 60 tablet, Rfl: 4 .  furosemide (LASIX) 80 MG tablet, Take 1 tablet (80 mg total) by mouth daily., Disp: 180 tablet, Rfl: 1 .  Glycopyrrolate-Formoterol (BEVESPI AEROSPHERE) 9-4.8 MCG/ACT AERO, Inhale 2 puffs into the lungs 2 (two) times daily., Disp: 5.9 g, Rfl: 5 .  hydrALAZINE (APRESOLINE) 25 MG tablet, TAKE 1 TABLET BY MOUTH THREE TIMES A DAY, Disp: 270 tablet, Rfl: 1 .  levalbuterol (XOPENEX HFA) 45 MCG/ACT inhaler, Inhale 1-2 puffs into the lungs every 6 (six) hours as needed for wheezing., Disp: 1 Inhaler, Rfl: 3 .  metFORMIN (GLUCOPHAGE) 1000 MG tablet, TAKE 1 TABLET TWICE DAILY WITH A MEAL, Disp: 180 tablet, Rfl: 4 .  metoprolol tartrate (LOPRESSOR) 100 MG tablet, Take 1 tablet (100 mg total) by mouth 2 (two) times daily., Disp: 180 tablet, Rfl: 3 .  nitroGLYCERIN (NITROSTAT) 0.4 MG SL tablet, Place 1 tablet (0.4 mg total) under the tongue every 5 (five) minutes as needed for chest pain. X 3 doses, Disp: 25 tablet, Rfl: 4 .  polyethylene glycol (MIRALAX / GLYCOLAX) packet, Take 17 g by mouth daily as needed for mild constipation., Disp: , Rfl:  .  TRESIBA FLEXTOUCH 100 UNIT/ML FlexTouch Pen, INJECT 32 UNITS INTO THE SKIN AT BEDTIME., Disp: 15 mL, Rfl: 2  Review of  Systems:  Constitutional: Denies fever, chills, diaphoresis, appetite change and fatigue.  HEENT: Denies photophobia, eye pain, redness, hearing loss, ear pain, congestion, sore throat, rhinorrhea, sneezing, mouth sores, trouble swallowing, neck pain, neck stiffness and tinnitus.   Respiratory: Denies SOB, DOE, cough, chest tightness,  and wheezing.   Cardiovascular: Denies chest pain, palpitations and leg swelling.  Gastrointestinal: Denies nausea, vomiting, abdominal pain, diarrhea, constipation, blood in stool and abdominal distention.  Genitourinary: Denies dysuria, urgency, frequency, hematuria, flank pain and difficulty urinating.  Endocrine: Denies: hot or cold intolerance, sweats, changes in hair or nails, polyuria, polydipsia. Musculoskeletal: Denies myalgias, back pain, joint swelling, arthralgias and gait problem.  Skin: Denies pallor, rash and wound.  Neurological: Denies dizziness, seizures, syncope, weakness, light-headedness, numbness and headaches.  Hematological: Denies adenopathy. Easy bruising, personal or family bleeding history  Psychiatric/Behavioral: Denies suicidal ideation, mood changes, confusion, nervousness, sleep disturbance  and agitation    Physical Exam: Vitals:   05/10/20 1033 05/10/20 1037  BP: (!) 200/120 (!) 164/110  Pulse: 91   Temp: 97.6 F (36.4 C)   TempSrc: Oral   SpO2: 93%   Weight: 177 lb 12.8 oz (80.6 kg)     Body mass index is 27.03 kg/m.   Constitutional: NAD, calm, comfortable Eyes: PERRL, lids and conjunctivae normal, wears corrective lenses ENMT: Mucous membranes are moist.  Respiratory: clear to auscultation bilaterally, no wheezing, no crackles. Normal respiratory effort. No accessory muscle use.  Cardiovascular: Regular rate and rhythm, no murmurs / rubs / gallops. No extremity edema.  Neurologic: Grossly intact and nonfocal Psychiatric: Normal judgment and insight. Alert and oriented x 3. Normal mood.    Impression and  Plan:  DM (diabetes mellitus), type 2 with peripheral vascular complications ([place]) -Well-controlled with an A1c of 6.4 in office today.  Essential hypertension -Very uncontrolled today. -I question his medication compliance as he is not able to name medications for me today. -His elevated measurements are especially concerning given his AAA that has been growing in size. -I have written down in explicit detail the way that he is supposed to take his medication.  I have asked him to check his blood pressure every day. -He will be scheduled follow-up in 2 weeks.   Patient Instructions  -Nice seeing you today!!  -Make sure you are taking: metoprolol 100 mg twice daily, hydralazine 25 mg three times daily and lasix 80 mg daily.  -Check your blood pressure once a day and write it down. Bring these numbers in to your next visit.  -Schedule a follow up in 2 weeks.     Lelon Frohlich, MD [facility]

## 2020-05-10 NOTE — ED Triage Notes (Signed)
Pt brought in by EMS for c/o HTN. Was seen by PCP today and BP was 164/110. Once @ home, pt stated his BP's were "even higher" and he was starting to become more SOB. Per EMS, pt's O2 sats on R/A were 91% so they placed pt on O2 at 4L and his sats increased to 97%. Pt admits to congestive cough with yellow sputum. Pt has hx of A-fib and per EMS, was showing A-fib on monitor with rate of 100.

## 2020-05-11 ENCOUNTER — Inpatient Hospital Stay (HOSPITAL_COMMUNITY): Payer: Medicare Other

## 2020-05-11 ENCOUNTER — Encounter (HOSPITAL_COMMUNITY): Payer: Self-pay | Admitting: Internal Medicine

## 2020-05-11 DIAGNOSIS — Z9861 Coronary angioplasty status: Secondary | ICD-10-CM | POA: Diagnosis not present

## 2020-05-11 DIAGNOSIS — I160 Hypertensive urgency: Secondary | ICD-10-CM | POA: Diagnosis present

## 2020-05-11 DIAGNOSIS — N1831 Chronic kidney disease, stage 3a: Secondary | ICD-10-CM

## 2020-05-11 DIAGNOSIS — Z833 Family history of diabetes mellitus: Secondary | ICD-10-CM | POA: Diagnosis not present

## 2020-05-11 DIAGNOSIS — I482 Chronic atrial fibrillation, unspecified: Secondary | ICD-10-CM | POA: Diagnosis present

## 2020-05-11 DIAGNOSIS — Z20822 Contact with and (suspected) exposure to covid-19: Secondary | ICD-10-CM | POA: Diagnosis present

## 2020-05-11 DIAGNOSIS — R7989 Other specified abnormal findings of blood chemistry: Secondary | ICD-10-CM

## 2020-05-11 DIAGNOSIS — F17210 Nicotine dependence, cigarettes, uncomplicated: Secondary | ICD-10-CM | POA: Diagnosis present

## 2020-05-11 DIAGNOSIS — I252 Old myocardial infarction: Secondary | ICD-10-CM | POA: Diagnosis not present

## 2020-05-11 DIAGNOSIS — I4891 Unspecified atrial fibrillation: Secondary | ICD-10-CM | POA: Diagnosis not present

## 2020-05-11 DIAGNOSIS — I5043 Acute on chronic combined systolic (congestive) and diastolic (congestive) heart failure: Secondary | ICD-10-CM

## 2020-05-11 DIAGNOSIS — I130 Hypertensive heart and chronic kidney disease with heart failure and stage 1 through stage 4 chronic kidney disease, or unspecified chronic kidney disease: Secondary | ICD-10-CM | POA: Diagnosis present

## 2020-05-11 DIAGNOSIS — Z7902 Long term (current) use of antithrombotics/antiplatelets: Secondary | ICD-10-CM | POA: Diagnosis not present

## 2020-05-11 DIAGNOSIS — E1165 Type 2 diabetes mellitus with hyperglycemia: Secondary | ICD-10-CM | POA: Diagnosis present

## 2020-05-11 DIAGNOSIS — R0602 Shortness of breath: Secondary | ICD-10-CM | POA: Diagnosis present

## 2020-05-11 DIAGNOSIS — I5033 Acute on chronic diastolic (congestive) heart failure: Secondary | ICD-10-CM | POA: Diagnosis present

## 2020-05-11 DIAGNOSIS — Z7984 Long term (current) use of oral hypoglycemic drugs: Secondary | ICD-10-CM | POA: Diagnosis not present

## 2020-05-11 DIAGNOSIS — Z8679 Personal history of other diseases of the circulatory system: Secondary | ICD-10-CM | POA: Diagnosis not present

## 2020-05-11 DIAGNOSIS — E1122 Type 2 diabetes mellitus with diabetic chronic kidney disease: Secondary | ICD-10-CM | POA: Diagnosis present

## 2020-05-11 DIAGNOSIS — Z794 Long term (current) use of insulin: Secondary | ICD-10-CM | POA: Diagnosis not present

## 2020-05-11 DIAGNOSIS — J449 Chronic obstructive pulmonary disease, unspecified: Secondary | ICD-10-CM | POA: Diagnosis present

## 2020-05-11 DIAGNOSIS — R9431 Abnormal electrocardiogram [ECG] [EKG]: Secondary | ICD-10-CM

## 2020-05-11 DIAGNOSIS — E785 Hyperlipidemia, unspecified: Secondary | ICD-10-CM | POA: Diagnosis present

## 2020-05-11 DIAGNOSIS — I10 Essential (primary) hypertension: Secondary | ICD-10-CM

## 2020-05-11 DIAGNOSIS — Z8249 Family history of ischemic heart disease and other diseases of the circulatory system: Secondary | ICD-10-CM | POA: Diagnosis not present

## 2020-05-11 DIAGNOSIS — D631 Anemia in chronic kidney disease: Secondary | ICD-10-CM | POA: Diagnosis present

## 2020-05-11 DIAGNOSIS — Z7901 Long term (current) use of anticoagulants: Secondary | ICD-10-CM | POA: Diagnosis not present

## 2020-05-11 DIAGNOSIS — I248 Other forms of acute ischemic heart disease: Secondary | ICD-10-CM | POA: Diagnosis present

## 2020-05-11 DIAGNOSIS — I509 Heart failure, unspecified: Secondary | ICD-10-CM

## 2020-05-11 DIAGNOSIS — I2510 Atherosclerotic heart disease of native coronary artery without angina pectoris: Secondary | ICD-10-CM

## 2020-05-11 DIAGNOSIS — E876 Hypokalemia: Secondary | ICD-10-CM | POA: Diagnosis not present

## 2020-05-11 LAB — ECHOCARDIOGRAM COMPLETE
Area-P 1/2: 5.88 cm2
Height: 68 in
S' Lateral: 3.36 cm
Weight: 2857.16 oz

## 2020-05-11 LAB — COMPREHENSIVE METABOLIC PANEL
ALT: 26 U/L (ref 0–44)
ALT: 27 U/L (ref 0–44)
AST: 31 U/L (ref 15–41)
AST: 28 U/L (ref 15–41)
Albumin: 3.6 g/dL (ref 3.5–5.0)
Albumin: 3.9 g/dL (ref 3.5–5.0)
Alkaline Phosphatase: 89 U/L (ref 38–126)
Alkaline Phosphatase: 88 U/L (ref 38–126)
Anion gap: 9 (ref 5–15)
Anion gap: 12 (ref 5–15)
BUN: 22 mg/dL (ref 8–23)
BUN: 22 mg/dL (ref 8–23)
CO2: 26 mmol/L (ref 22–32)
CO2: 27 mmol/L (ref 22–32)
Calcium: 8.7 mg/dL — ABNORMAL LOW (ref 8.9–10.3)
Calcium: 8.8 mg/dL — ABNORMAL LOW (ref 8.9–10.3)
Chloride: 106 mmol/L (ref 98–111)
Chloride: 104 mmol/L (ref 98–111)
Creatinine, Ser: 1.49 mg/dL — ABNORMAL HIGH (ref 0.61–1.24)
Creatinine, Ser: 1.38 mg/dL — ABNORMAL HIGH (ref 0.61–1.24)
GFR, Estimated: 47 mL/min — ABNORMAL LOW (ref >60)
GFR, Estimated: 52 mL/min — ABNORMAL LOW (ref >60)
Glucose, Bld: 143 mg/dL — ABNORMAL HIGH (ref 70–99)
Glucose, Bld: 146 mg/dL — ABNORMAL HIGH (ref 70–99)
Potassium: 3.7 mmol/L (ref 3.5–5.1)
Potassium: 3 mmol/L — ABNORMAL LOW (ref 3.5–5.1)
Sodium: 141 mmol/L (ref 135–145)
Sodium: 143 mmol/L (ref 135–145)
Total Bilirubin: 1.1 mg/dL (ref 0.3–1.2)
Total Bilirubin: 1.3 mg/dL — ABNORMAL HIGH (ref 0.3–1.2)
Total Protein: 7.1 g/dL (ref 6.5–8.1)
Total Protein: 7.5 g/dL (ref 6.5–8.1)

## 2020-05-11 LAB — TROPONIN I (HIGH SENSITIVITY)
Troponin I (High Sensitivity): 25 ng/L — ABNORMAL HIGH (ref <18)
Troponin I (High Sensitivity): 23 ng/L — ABNORMAL HIGH (ref <18)

## 2020-05-11 LAB — RESP PANEL BY RT-PCR (FLU A&B, COVID) ARPGX2
Influenza A by PCR: NEGATIVE
Influenza B by PCR: NEGATIVE
SARS Coronavirus 2 by RT PCR: NEGATIVE

## 2020-05-11 LAB — CBC
HCT: 40.7 % (ref 39.0–52.0)
Hemoglobin: 12.9 g/dL — ABNORMAL LOW (ref 13.0–17.0)
MCH: 28.1 pg (ref 26.0–34.0)
MCHC: 31.7 g/dL (ref 30.0–36.0)
MCV: 88.7 fL (ref 80.0–100.0)
Platelets: 194 10*3/uL (ref 150–400)
RBC: 4.59 MIL/uL (ref 4.22–5.81)
RDW: 14.5 % (ref 11.5–15.5)
WBC: 10.4 10*3/uL (ref 4.0–10.5)
nRBC: 0 % (ref 0.0–0.2)

## 2020-05-11 LAB — BRAIN NATRIURETIC PEPTIDE: B Natriuretic Peptide: 696 pg/mL — ABNORMAL HIGH (ref 0.0–100.0)

## 2020-05-11 LAB — PROTIME-INR
INR: 1.3 — ABNORMAL HIGH (ref 0.8–1.2)
Prothrombin Time: 15.4 seconds — ABNORMAL HIGH (ref 11.4–15.2)

## 2020-05-11 LAB — APTT: aPTT: 30 seconds (ref 24–36)

## 2020-05-11 LAB — PHOSPHORUS: Phosphorus: 3.4 mg/dL (ref 2.5–4.6)

## 2020-05-11 LAB — MAGNESIUM: Magnesium: 1.8 mg/dL (ref 1.7–2.4)

## 2020-05-11 LAB — GLUCOSE, CAPILLARY
Glucose-Capillary: 139 mg/dL — ABNORMAL HIGH (ref 70–99)
Glucose-Capillary: 164 mg/dL — ABNORMAL HIGH (ref 70–99)
Glucose-Capillary: 142 mg/dL — ABNORMAL HIGH (ref 70–99)
Glucose-Capillary: 194 mg/dL — ABNORMAL HIGH (ref 70–99)

## 2020-05-11 MED ORDER — APIXABAN 5 MG PO TABS
5.0000 mg | ORAL_TABLET | Freq: Two times a day (BID) | ORAL | Status: DC
  Administered 2020-05-11 – 2020-05-12 (×3): 5 mg via ORAL
  Filled 2020-05-11 (×3): qty 1

## 2020-05-11 MED ORDER — FUROSEMIDE 10 MG/ML IJ SOLN
40.0000 mg | Freq: Two times a day (BID) | INTRAMUSCULAR | Status: DC
  Administered 2020-05-11 (×2): 40 mg via INTRAVENOUS
  Filled 2020-05-11 (×2): qty 4

## 2020-05-11 MED ORDER — LEVALBUTEROL TARTRATE 45 MCG/ACT IN AERO: 1.0000 | INHALATION_SPRAY | Freq: Four times a day (QID) | RESPIRATORY_TRACT | Status: DC | PRN

## 2020-05-11 MED ORDER — FUROSEMIDE 10 MG/ML IJ SOLN
80.0000 mg | Freq: Once | INTRAMUSCULAR | Status: AC
  Administered 2020-05-11: 80 mg via INTRAVENOUS
  Filled 2020-05-11: qty 8

## 2020-05-11 MED ORDER — POTASSIUM CHLORIDE CRYS ER 20 MEQ PO TBCR
40.0000 meq | EXTENDED_RELEASE_TABLET | ORAL | Status: AC
  Administered 2020-05-11 (×2): 40 meq via ORAL
  Filled 2020-05-11 (×2): qty 2

## 2020-05-11 MED ORDER — LABETALOL HCL 5 MG/ML IV SOLN
5.0000 mg | Freq: Once | INTRAVENOUS | Status: AC
  Administered 2020-05-11: 5 mg via INTRAVENOUS
  Filled 2020-05-11: qty 4

## 2020-05-11 MED ORDER — ALBUTEROL SULFATE (2.5 MG/3ML) 0.083% IN NEBU: 2.5000 mg | INHALATION_SOLUTION | RESPIRATORY_TRACT | Status: DC | PRN

## 2020-05-11 MED ORDER — INSULIN ASPART 100 UNIT/ML ~~LOC~~ SOLN
0.0000 [IU] | Freq: Three times a day (TID) | SUBCUTANEOUS | Status: DC
  Administered 2020-05-11: 1 [IU] via SUBCUTANEOUS
  Administered 2020-05-11: 2 [IU] via SUBCUTANEOUS
  Administered 2020-05-11 – 2020-05-12 (×2): 1 [IU] via SUBCUTANEOUS

## 2020-05-11 MED ORDER — CLOPIDOGREL BISULFATE 75 MG PO TABS
75.0000 mg | ORAL_TABLET | Freq: Every day | ORAL | Status: DC
  Administered 2020-05-11 – 2020-05-12 (×2): 75 mg via ORAL
  Filled 2020-05-11 (×2): qty 1

## 2020-05-11 MED ORDER — ATORVASTATIN CALCIUM 40 MG PO TABS
40.0000 mg | ORAL_TABLET | Freq: Every day | ORAL | Status: DC
  Administered 2020-05-11 – 2020-05-12 (×2): 40 mg via ORAL
  Filled 2020-05-11 (×2): qty 1

## 2020-05-11 MED ORDER — HYDRALAZINE HCL 20 MG/ML IJ SOLN
10.0000 mg | Freq: Once | INTRAMUSCULAR | Status: AC
  Administered 2020-05-11: 10 mg via INTRAVENOUS
  Filled 2020-05-11: qty 1

## 2020-05-11 MED ORDER — LABETALOL HCL 5 MG/ML IV SOLN: 5.0000 mg | Freq: Four times a day (QID) | INTRAVENOUS | Status: DC | PRN

## 2020-05-11 MED ORDER — DILTIAZEM HCL 30 MG PO TABS
30.0000 mg | ORAL_TABLET | Freq: Four times a day (QID) | ORAL | Status: DC
  Administered 2020-05-11 – 2020-05-12 (×4): 30 mg via ORAL
  Filled 2020-05-11 (×4): qty 1

## 2020-05-11 MED ORDER — METOPROLOL TARTRATE 50 MG PO TABS
100.0000 mg | ORAL_TABLET | Freq: Two times a day (BID) | ORAL | Status: DC
  Administered 2020-05-11 – 2020-05-12 (×3): 100 mg via ORAL
  Filled 2020-05-11 (×3): qty 2

## 2020-05-11 MED ORDER — INSULIN ASPART 100 UNIT/ML ~~LOC~~ SOLN: 0.0000 [IU] | Freq: Every day | SUBCUTANEOUS | Status: DC

## 2020-05-11 MED ORDER — HYDRALAZINE HCL 25 MG PO TABS
25.0000 mg | ORAL_TABLET | Freq: Three times a day (TID) | ORAL | Status: DC
  Administered 2020-05-11 – 2020-05-12 (×4): 25 mg via ORAL
  Filled 2020-05-11 (×4): qty 1

## 2020-05-11 MED ORDER — NITROGLYCERIN 0.4 MG SL SUBL: 0.4000 mg | SUBLINGUAL_TABLET | SUBLINGUAL | Status: DC | PRN

## 2020-05-11 NOTE — H&P (Addendum)
History and Physical  Jason Fisher QIW:[id number] DOB: 09/09/1940 DOA: 05/10/2020  Referring physician: Dorise Bullion, MD PCP: [name], [name], MD  Patient coming from: Home  Chief Complaint: Shortness of breath  HPI: Jason Fisher is a 80 y.o. male with medical history significant for A. fib on Eliquis, CHF, CAD, AAA, T2DM who presents to the emergency department due to 1 month onset of shortness of breath which worsened within the last several days, he has less tolerance in being able to go through stairs of flight at home due to shortness of breath.  Patient went to see his PCP, and was noted to have elevated BP and was asked to return to the clinic in 2 weeks for follow-up, on returning home, his shortness of breath worsened, EMS was activated, O2 sat was 91% on room air and was dry with supplemental oxygen via [place] 12 PM by EMS team.  Patient endorsed cough with clear yellow sputum.  He denies chest pain, shortness of breath, fever, chills  ED Course: In the emergency department, he was tachypneic, BP was 207/109.  O2 sat ranged with pain 96-100% on supplemental oxygen via MPI at 4 PM.  Other vital signs were within normal range.  Work-up in the ED showed normocytic anemia, hyperglycemia, BUN/creatinine 22/1.49 (creatinine within baseline range) eGFR 47.  BNP 696, troponin x2-25 > 23.  SARS coronavirus 2 was negative.  Chest x-ray showed cardiomegaly, left pleural effusion.  Blunting of right costophrenic angles thought to be related to hyperinflation rather than effusion.  He was treated with IV Lasix 80 Mg x1, IV labetalol 5 Mg x1 was given.  Hospitalist was asked to admit patient for further evaluation and management.   Review of Systems: Constitutional: Negative for chills and fever.  HENT: Negative for ear pain and sore throat.   Eyes: Negative for pain and visual disturbance.  Respiratory: Positive for cough and shortness of breath.   Cardiovascular: Negative for chest  pain and palpitations.  Gastrointestinal: Negative for abdominal pain and vomiting.  Endocrine: Negative for polyphagia and polyuria.  Genitourinary: Negative for decreased urine volume, dysuria, enuresis, hematuria  Musculoskeletal: Negative for arthralgias and back pain.  Skin: Negative for color change and rash.  Allergic/Immunologic: Negative for immunocompromised state.  Neurological: Negative for tremors, syncope, speech difficulty Hematological: Does not bruise/bleed easily.  All other systems reviewed and are negative   Past Medical History:  Diagnosis Date  . 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 BENIGN PROSTATIC HYPERTROPHY, HX OF 02/24/2007  . CAD S/P PCI    a. 2007 Inf STEMI s/p Vision BMS  2 to RCA;  b. 2009 ISR Prox RCA Stent-->with DES;  c. 09/2015 PCI: LM nl, LAD 30p/m, D1 90, LCX nl, OM1 small, 80, OM2 90 (2.5x14 Biofreedom stent), OM3 small, RCA 30p ISR, 49mISR, 438m85d, EF 50-55%.  . 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 Chronic atrial fibrillation ([facility]10/06/2009   a. On Eliquis (CHA2DS2VASc = 5).  Rate controlled w/ BB and CCB.  . Marland Kitchenhronic diastolic CHF (congestive [place] failure) [place]   a. 09/2015 Echo: EF 55-60%, mildly dil LA.  . CKD (chronic kidney disease), stage III ([facility]   they mentioned this to the patient but they would work on it later after the aneurysm  . COLONIC POLYPS, HX OF 11/02/2006  . COPD (chronic obstructive pulmonary disease) ([facility]  . DIABETES MELLITUS, TYPE II 10/29/2006  . Diverticulosis   . History of tobacco  abuse    a. Quit 09/2015.  Marland Kitchen HYPERLIPIDEMIA 10/29/2006  . Hypertension   . Hypertensive heart disease with heart failure ([place])   . Pneumonia 09/2015   [name] 07/31/2016  . STEMI (ST elevation myocardial infarction) [facility]) 2007   [name] 07/31/2016   Past Surgical History:  Procedure Laterality Date  . ABDOMINAL AORTIC ENDOVASCULAR STENT GRAFT N/A 07/31/2016   Procedure: ABDOMINAL AORTIC ENDOVASCULAR STENT GRAFT;  Surgeon:  Serafina Mitchell, MD;  Location: [place];  Service: Vascular;  Laterality: N/A;  . CARDIAC CATHETERIZATION N/A 09/26/2015   Procedure: Left Heart Cath and Coronary Angiography;  Surgeon: Peter M Martinique, MD;  Location: [place] CV LAB;  Service: Cardiovascular;  Laterality: N/A;  . CARDIAC CATHETERIZATION N/A 09/26/2015   Procedure: Coronary Stent Intervention;  Surgeon: Peter M Martinique, MD;  Location: [place] CV LAB;  Service: Cardiovascular;  Laterality: N/A;  . CATARACT EXTRACTION Bilateral   . COLONOSCOPY    . CORONARY ANGIOPLASTY WITH STENT PLACEMENT  2007   Inferior STEMI 2007 - RCA PCI with 2 Vision BMS; Abnormal Myoview in 2009 - pRCA ins-stent restenosis & unstented disease - DES PCI Promus 3.0 x 20 mm (3.5 mm)  . ENDOVASCULAR REPAIR/STENT GRAFT  07/31/2016  . INCISION AND DRAINAGE PERIRECTAL ABSCESS N/A 11/06/2015   Procedure: IRRIGATION AND DEBRIDEMENT PERIRECTAL ABSCESS, POSSIBLE HEMORRHOIDECTOMY;  Surgeon: Coralie Keens, MD;  Location: [place];  Service: General;  Laterality: N/A;  . ORIF SHOULDER FRACTURE Right    "fell out of back end of a truck"    Social History:  reports that he has been smoking cigarettes. He has a 49.00 pack-year smoking history. He has never used smokeless tobacco. He reports that he does not drink alcohol and does not use drugs.   Allergies  Allergen Reactions  . Contrast Media [Iodinated Diagnostic Agents] Anaphylaxis    Family History  Problem Relation Age of Onset  . Heart attack Mother   . Diabetes Mother   . Heart attack Father   . Hypertension Father   . Heart attack Brother   . Diabetes Brother      Prior to Admission medications   Medication Sig Start Date End Date Taking? Authorizing Provider  ACCU-CHEK AVIVA PLUS test strip CHECK BLOOD SUGAR ONCE DAILY. DX E11.51 06/29/19   [name], [name], MD  atorvastatin (LIPITOR) 40 MG tablet TAKE 1 TABLET BY MOUTH EVERY DAY 05/09/20   [name], [name], MD  clopidogrel  (PLAVIX) 75 MG tablet TAKE 1 TABLET BY MOUTH EVERY DAY 11/20/19   [name], [name], MD  ELIQUIS 5 MG TABS tablet TAKE 1 TABLET BY MOUTH TWICE A DAY 09/04/19   Martinique, Peter M, MD  furosemide (LASIX) 80 MG tablet Take 1 tablet (80 mg total) by mouth daily. 11/07/19   [name], [name], MD  Glycopyrrolate-Formoterol (BEVESPI AEROSPHERE) 9-4.8 MCG/ACT AERO Inhale 2 puffs into the lungs 2 (two) times daily. 07/13/19   Collene Gobble, MD  hydrALAZINE (APRESOLINE) 25 MG tablet TAKE 1 TABLET BY MOUTH THREE TIMES A DAY 11/18/19   [name], [name], MD  levalbuterol [facility] HFA) 45 MCG/ACT inhaler Inhale 1-2 puffs into the lungs every 6 (six) hours as needed for wheezing. 06/01/19   Collene Gobble, MD  metFORMIN (GLUCOPHAGE) 1000 MG tablet TAKE 1 TABLET TWICE DAILY WITH A MEAL 11/03/19   [name], [name], MD  metoprolol tartrate (LOPRESSOR) 100 MG tablet Take 1 tablet (100 mg total) by mouth 2 (  two) times daily. 11/01/19   Martinique, Peter M, MD  nitroGLYCERIN (NITROSTAT) 0.4 MG SL tablet Place 1 tablet (0.4 mg total) under the tongue every 5 (five) minutes as needed for chest pain. X 3 doses 10/25/18   [name], [name], MD  polyethylene glycol [facility] / Floria Raveling) packet Take 17 g by mouth daily as needed for mild constipation.    [provider]  TRESIBA FLEXTOUCH 100 UNIT/ML FlexTouch Pen INJECT 32 UNITS INTO THE SKIN AT BEDTIME. 04/30/20   [name], [name], MD    Physical Exam: BP (!) 187/122 (BP Location: Left Arm)   Pulse 82   Temp 97.7 F (36.5 C) (Oral)   Resp 18   Ht '5\' 8"'  (1.727 m)   Wt 81 kg   SpO2 90%   BMI 27.15 kg/m   . General: 80 y.o. year-old male well developed well nourished in no acute distress.  Alert and oriented x3. Marland Kitchen HEENT: NCAT, EOMI . Neck: Supple, trachea medial . Cardiovascular: Regular rate and rhythm with no rubs or gallops.  No thyromegaly or JVD noted.  No lower extremity edema. 2/4 pulses in all 4  extremities. Marland Kitchen Respiratory: Clear to auscultation with no wheezes or rales. Good inspiratory effort. . Abdomen: Soft nontender nondistended with normal bowel sounds x4 quadrants. . Muskuloskeletal: No cyanosis, clubbing or edema noted bilaterally . Neuro: CN II-XII intact, strength 5/5 x 4, sensation, reflexes intact . Skin: No ulcerative lesions noted or rashes . Psychiatry: Judgement and insight appear normal. Mood is appropriate for condition and setting          Labs on Admission:  Basic Metabolic Panel: Recent Labs  Lab 05/10/20 2326  NA 141  K 3.7  CL 106  CO2 26  GLUCOSE 143*  BUN 22  CREATININE 1.49*  CALCIUM 8.7*   Liver Function Tests: Recent Labs  Lab 05/10/20 2326  AST 31  ALT 26  ALKPHOS 89  BILITOT 1.1  PROT 7.1  ALBUMIN 3.6   No results for input(s): LIPASE, AMYLASE in the last 168 hours. No results for input(s): AMMONIA in the last 168 hours. CBC: Recent Labs  Lab 05/10/20 2326  WBC 9.1  NEUTROABS 6.4  HGB 12.0*  HCT 37.6*  MCV 89.3  PLT 168   Cardiac Enzymes: No results for input(s): CKTOTAL, CKMB, CKMBINDEX, TROPONINI in the last 168 hours.  BNP (last 3 results) Recent Labs    05/10/20 2326  BNP 696.0*    ProBNP (last 3 results) No results for input(s): PROBNP in the last 8760 hours.  CBG: No results for input(s): GLUCAP in the last 168 hours.  Radiological Exams on Admission: DG Chest Portable 1 View  Result Date: 05/10/2020 CLINICAL DATA:  Shortness of breath.  Current smoker. EXAM: PORTABLE CHEST 1 VIEW COMPARISON:  Most recent radiograph 10/30/2019. FINDINGS: Mild cardiomegaly. Aortic atherosclerosis. The lungs are hyperinflated. Blunting of the costophrenic angles, left greater than right. Findings suspicious for small left pleural effusion. Blunting of right costophrenic angles felt to be related to hyperinflation. Increased bronchial thickening from prior. No pneumothorax. Stable osseous structures. IMPRESSION: 1. Increased  peribronchial thickening from prior exam. This may be bronchitic or congestive. 2. Cardiomegaly. Left pleural effusion. Blunting of right costophrenic angles felt to be related to hyperinflation rather than effusion. 3. Chronic hyperinflation. Aortic Atherosclerosis (ICD10-I70.0). Electronically Signed   By: Keith Rake M.D.   On: 05/10/2020 21:23    EKG: I independently viewed the EKG done and my findings are  as followed: A. fib with rate control.  Assessment/Plan Present on Admission: . Essential hypertension . CKD (chronic kidney disease), stage III (HCC)  Principal Problem:   Acute exacerbation of CHF (congestive heart failure) (HCC) Active Problems:   Essential hypertension   CAD S/P percutaneous coronary angioplasty   CKD (chronic kidney disease), stage III (HCC)   Hyperglycemia due to diabetes mellitus (HCC)   Elevated brain natriuretic peptide (BNP) level   Hypertensive urgency   Prolonged QT interval  Shortness of breath possibly secondary to acute on chronic CHF Elevated BNP (696) Continue total input/output, daily weights and fluid restriction IV Lasix 80 Mg x1 was given, continue IV Lasix Continue Cardiac diet  Echocardiogram done on 11/05/2017 showed LVEF of 60 to 70% with no regional wall motion abnormality.   Echocardiogram will be done in the morning   Hypertensive urgency Essential hypertension (uncontrolled) IV labetalol 5 Mg x 1 all was given IV hydralazine 10 Mg x1 was given Continue Lopressor, hydralazine Continue IV labetalol 5 mg every 6 hours as needed for SBP greater than 180 was given.  Hyperglycemia secondary to T2DM Continue ISS and hypoglycemic protocol Metformin and [name] will be held at this time  Prolonged QTc (493 ms) Avoid QT prolonging drugs Magnesium level will be checked Repeat EKG in the morning  Chronic atrial fibrillation Continue Eliquis, Lopressor  CAD Continue Plavix, Lipitor, Nitroglycerin as needed  History of  chronic bronchitis Continue Xopenex per home regimen  Hyperlipidemia Continue Lipitor  Chronic kidney disease stage IIIA BUN/creatinine 22/1.49 (creatinine within baseline range) Renally adjust medications, avoid nephrotoxic agents/dehydration/hypotension   DVT prophylaxis: Eliquis  Code Status: Full code  Family Communication: None at bedside  Disposition Plan:  Patient is from:                        home Anticipated DC to:                   SNF or family members home Anticipated DC date:               2-3 days Anticipated DC barriers:           Patient is unstable to be discharged at this time due to shortness of breath due to CHF exacerbation   Consults called: None  Admission status: Inpatient    Bernadette Hoit MD [facility]  05/11/2020, 3:53 AM

## 2020-05-11 NOTE — Plan of Care (Signed)
  Problem: Education: Goal: Knowledge of General Education information will improve Description: Including pain rating scale, medication(s)/side effects and non-pharmacologic comfort measures Outcome: Not Progressing   Problem: Clinical Measurements: Goal: Ability to maintain clinical measurements within normal limits will improve Outcome: Not Progressing   Problem: Clinical Measurements: Goal: Respiratory complications will improve Outcome: Not Progressing

## 2020-05-11 NOTE — Progress Notes (Signed)
  Echocardiogram 2D Echocardiogram has been performed.  Jason Fisher 05/11/2020, 9:17 AM

## 2020-05-11 NOTE — Progress Notes (Signed)
PROGRESS NOTE   Jason Fisher  LFY:[id number]    DOB: 06/16/1940    DOA: 05/10/2020  PCP: [name], [name], MD   I have briefly reviewed patients previous medical records in [facility].  Chief Complaint  Patient presents with  . Hypertension    Brief Narrative:  80 year old male with medical history significant for but not limited to CAD (s/p BMS to RCA 2007, ISR with DES to RCA in 2009, DES to OM2 in 09/2015), chronic atrial fibrillation on Eliquis, chronic diastolic CHF, hypertension, hyperlipidemia, AAA s/P endovascular stenting 07/2016, COPD, DM 2 and tobacco abuse presented to the ED via EMS due to progressively worsening dyspnea on exertion that started almost a month ago and worsened in the last several days.  In the ED, tachypneic, BP 207/109, oxygen saturations 96-100% on supplemental oxygen 4 L/min.  He was admitted for acute on chronic diastolic CHF and A. fib with RVR.   Assessment & Plan:  Principal Problem:   Acute exacerbation of CHF (congestive heart failure) (HCC) Active Problems:   Essential hypertension   CAD S/P percutaneous coronary angioplasty   CKD (chronic kidney disease), stage III (HCC)   Hyperglycemia due to diabetes mellitus (HCC)   Elevated brain natriuretic peptide (BNP) level   Hypertensive urgency   Prolonged QT interval   Acute on chronic diastolic CHF:  May be precipitated by high salt intake and A. fib with RVR.  Claims compliance with diuretics.  BNP 696.  Chest x-ray on admission suggestive of CHF.  S/p IV Lasix 80 mg x 1 in ED and then placed on IV Lasix 40 mg every 12 hours.  2D echo: LVEF 50-55%, no regional wall motion abnormalities, moderate LVH, LV diastolic function could not be evaluated.  Intake output and weight recording thus far are inaccurate.  Follow closely  Chronic A. fib with RVR  Telemetry this morning showed A. fib with ventricular rate in the 90s-100s mostly but at times up to 120s prior to his a.m.  metoprolol 100 mg dose.  Subsequent to his metoprolol, rates improved to the 90s.  However with activity, dyspneic on mild exertion and RVR up to 120s-130s.  Continue prior home dose of metoprolol 100 mg twice daily.  Initiated Cardizem 30 mg every 6 hours.    Reviewed office visit with Dr. Martinique, primary cardiologist on 01/05/2020: Reportedly had issues with bradycardia while on diltiazem and metoprolol, diltiazem was stopped.  Monitor on telemetry closely.  If has recurrence of bradycardia issues, will have to stop Cardizem.  If having issues with difficulty controlling his rate, consider discussing with cardiology.  Continue apixaban.  Hypokalemia  Replace aggressively and follow.  Magnesium 1.8.  Essential hypertension/hypertensive urgency  Presented with hypertensive urgency.  S/p IV labetalol 5 mg x 1 and hydralazine 10 mg x 1 in ED.  Now mildly uncontrolled.  Continue hydralazine 25 mg 3 times daily, metoprolol 100 mg twice daily and now newly on Cardizem.  Monitor closely.  Hyperlipidemia  Continue atorvastatin 40 mg daily.  Type II DM  Reasonably controlled on SSI.  Metformin and [name] currently on hold.  CAD s/p multiple PCI's:  Denies anginal symptoms  Continue Plavix, metoprolol and atorvastatin.  Minimally elevated troponin with flat trend (25 > 23), likely secondary to demand ischemia from decompensated CHF, A. fib and chronic kidney disease.  History of AAA repair  Outpatient follow-up with vascular surgery.  Stage III a chronic kidney disease  Creatinine appears to be at baseline.  Follow BMP closely.  Borderline prolonged QT interval  EKG on admission with QTC of 493 ms.  Replace potassium.  Monitor on telemetry  Avoid QT prolonging medications.   Body mass index is 27.15 kg/m.    DVT prophylaxis: SCDs Start: 05/11/20 0218     Code Status: Full Code Family Communication: None at bedside this morning. Disposition:  Status is:  Inpatient  Remains inpatient appropriate because:Inpatient level of care appropriate due to severity of illness   Dispo: The patient is from: Home              Anticipated d/c is to: Home              Patient currently is not medically stable to d/c.   Difficult to place patient No        Consultants:   None  Procedures:   None  Antimicrobials:    Anti-infectives (From admission, onward)   None        Subjective:  Seen this morning.  Reported feeling much better.  Actually asking if he could go home.  However had not been out of bed yet.  Dyspnea had improved.  Subsequently ambulated with RN and noted to be dyspneic on exertion with A. fib with RVR up to 120s-130s and saturating 92-93% on room air.  Volunteers to eating out more recently with spouse and may be increased salt intake.  Compliant with Lasix.  Dry weight-not sure but thinks it is 177 pounds.  Objective:   Vitals:   05/11/20 1035 05/11/20 1326 05/11/20 1606 05/11/20 1607  BP: (!) 155/96 120/76 (!) 159/98   Pulse: 99 98    Resp: 18 18    Temp: 97.6 F (36.4 C) 98.1 F (36.7 C)    TempSrc: Oral Oral    SpO2: 99% 93%  94%  Weight:      Height:        General exam: Elderly male, moderately built and nourished lying comfortably propped up in bed without distress. Respiratory system: Occasional basal crackles but otherwise clear to auscultation.  No increased work of breathing. Cardiovascular system: S1 & S2 heard, irregularly irregular and mildly tachycardic. No JVD, murmurs, rubs, gallops or clicks.  No ankle edema.  Telemetry personally reviewed and as noted above Gastrointestinal system: Abdomen is nondistended, soft and nontender. No organomegaly or masses felt. Normal bowel sounds heard. Central nervous system: Alert and oriented. No focal neurological deficits. Extremities: Symmetric 5 x 5 power. Skin: No rashes, lesions or ulcers Psychiatry: Judgement and insight appear normal. Mood & affect  appropriate.     Data Reviewed:   I have personally reviewed following labs and imaging studies   CBC: Recent Labs  Lab 05/10/20 2326 05/11/20 0612  WBC 9.1 10.4  NEUTROABS 6.4  --   HGB 12.0* 12.9*  HCT 37.6* 40.7  MCV 89.3 88.7  PLT 168 789    Basic Metabolic Panel: Recent Labs  Lab 05/10/20 2326 05/11/20 0612  NA 141 143  K 3.7 3.0*  CL 106 104  CO2 26 27  GLUCOSE 143* 146*  BUN 22 22  CREATININE 1.49* 1.38*  CALCIUM 8.7* 8.8*  MG  --  1.8  PHOS  --  3.4    Liver Function Tests: Recent Labs  Lab 05/10/20 2326 05/11/20 0612  AST 31 28  ALT 26 27  ALKPHOS 89 88  BILITOT 1.1 1.3*  PROT 7.1 7.5  ALBUMIN 3.6 3.9    CBG: Recent Labs  Lab  05/11/20 0747 05/11/20 1113 05/11/20 1614  GLUCAP 139* 164* 142*    Microbiology Studies:   Recent Results (from the past 240 hour(s))  Resp Panel by RT-PCR (Flu A&B, Covid) Nasopharyngeal Swab     Status: None   Collection Time: 05/10/20 11:33 PM   Specimen: Nasopharyngeal Swab; Nasopharyngeal(NP) swabs in vial transport medium  Result Value Ref Range Status   SARS Coronavirus 2 by RT PCR NEGATIVE NEGATIVE Final    Comment: (NOTE) SARS-CoV-2 target nucleic acids are NOT DETECTED.  The SARS-CoV-2 RNA is generally detectable in upper respiratory specimens during the acute phase of infection. The lowest concentration of SARS-CoV-2 viral copies this assay can detect is 138 copies/mL. A negative result does not preclude SARS-Cov-2 infection and should not be used as the sole basis for treatment or other patient management decisions. A negative result may occur with  improper specimen collection/handling, submission of specimen other than nasopharyngeal swab, presence of viral mutation(s) within the areas targeted by this assay, and inadequate number of viral copies(<138 copies/mL). A negative result must be combined with clinical observations, patient history, and epidemiological information. The expected  result is Negative.  Fact Sheet for Patients:  EntrepreneurPulse.com.au  Fact Sheet for Healthcare Providers:  IncredibleEmployment.be  This test is no t yet approved or cleared by the [place] FDA and  has been authorized for detection and/or diagnosis of SARS-CoV-2 by FDA under an Emergency Use Authorization (EUA). This EUA will remain  in effect (meaning this test can be used) for the duration of the COVID-19 declaration under Section 564(b)(1) of the Act, 21 U.S.C.section 360bbb-3(b)(1), unless the authorization is terminated  or revoked sooner.       Influenza A by PCR NEGATIVE NEGATIVE Final   Influenza B by PCR NEGATIVE NEGATIVE Final    Comment: (NOTE) The Xpert Xpress SARS-CoV-2/FLU/RSV plus assay is intended as an aid in the diagnosis of influenza from Nasopharyngeal swab specimens and should not be used as a sole basis for treatment. Nasal washings and aspirates are unacceptable for Xpert Xpress SARS-CoV-2/FLU/RSV testing.  Fact Sheet for Patients: EntrepreneurPulse.com.au  Fact Sheet for Healthcare Providers: IncredibleEmployment.be  This test is not yet approved or cleared by the [place] FDA and has been authorized for detection and/or diagnosis of SARS-CoV-2 by FDA under an Emergency Use Authorization (EUA). This EUA will remain in effect (meaning this test can be used) for the duration of the COVID-19 declaration under Section 564(b)(1) of the Act, 21 U.S.C. section 360bbb-3(b)(1), unless the authorization is terminated or revoked.  Performed at [facility], [address], [place], [place] [address]      Radiology Studies:  DG Chest Portable 1 View  Result Date: 05/10/2020 CLINICAL DATA:  Shortness of breath.  Current smoker. EXAM: PORTABLE CHEST 1 VIEW COMPARISON:  Most recent radiograph 10/30/2019. FINDINGS: Mild cardiomegaly. Aortic atherosclerosis. The lungs are  hyperinflated. Blunting of the costophrenic angles, left greater than right. Findings suspicious for small left pleural effusion. Blunting of right costophrenic angles felt to be related to hyperinflation. Increased bronchial thickening from prior. No pneumothorax. Stable osseous structures. IMPRESSION: 1. Increased peribronchial thickening from prior exam. This may be bronchitic or congestive. 2. Cardiomegaly. Left pleural effusion. Blunting of right costophrenic angles felt to be related to hyperinflation rather than effusion. 3. Chronic hyperinflation. Aortic Atherosclerosis (ICD10-I70.0). Electronically Signed   By: Keith Rake M.D.   On: 05/10/2020 21:23   ECHOCARDIOGRAM COMPLETE  Result Date: 05/11/2020    ECHOCARDIOGRAM REPORT   Patient  Name:   Jason Fisher Date of Exam: 05/11/2020 Medical Rec #:  [id number]      Height:       68.0 in Accession #:    [id number]     Weight:       178.6 lb Date of Birth:  27-Jan-1941      BSA:          1.948 m Patient Age:    26 years       BP:           159/92 mmHg Patient Gender: M              HR:           100 bpm. Exam Location:  [facility] Procedure: 2D Echo, Cardiac Doppler and Color Doppler Indications:    CHF  History:        Patient has prior history of Echocardiogram examinations, most                 recent 11/05/2017. CHF, CAD and Previous Myocardial Infarction,                 COPD, Arrythmias:Atrial Fibrillation, Signs/Symptoms:Shortness                 of Breath; Risk Factors:Diabetes, Hypertension, Dyslipidemia and                 Former Smoker.  Sonographer:    Dustin Flock RDCS Referring Phys: [id number] OLADAPO ADEFESO IMPRESSIONS  1. Left ventricular ejection fraction, by estimation, is 50 to 55%. The left ventricle has low normal function. The left ventricle has no regional wall motion abnormalities. There is moderate left ventricular hypertrophy. Left ventricular diastolic function could not be evaluated.  2. Right ventricular systolic function is  normal. The right ventricular size is normal. There is normal pulmonary artery systolic pressure.  3. Left atrial size was mildly dilated.  4. The mitral valve is grossly normal. Trivial mitral valve regurgitation.  5. The aortic valve is grossly normal. Aortic valve regurgitation is trivial.  6. The inferior vena cava is dilated in size with >50% respiratory variability, suggesting right atrial pressure of 8 mmHg. FINDINGS  Left Ventricle: Left ventricular ejection fraction, by estimation, is 50 to 55%. The left ventricle has low normal function. The left ventricle has no regional wall motion abnormalities. The left ventricular internal cavity size was normal in size. There is moderate left ventricular hypertrophy. Left ventricular diastolic function could not be evaluated due to atrial fibrillation. Left ventricular diastolic function could not be evaluated. Right Ventricle: The right ventricular size is normal. No increase in right ventricular wall thickness. Right ventricular systolic function is normal. There is normal pulmonary artery systolic pressure. The tricuspid regurgitant velocity is 2.14 m/s, and  with an assumed right atrial pressure of 3 mmHg, the estimated right ventricular systolic pressure is 88.4 mmHg. Left Atrium: Left atrial size was mildly dilated. Right Atrium: Right atrial size was normal in size. Pericardium: There is no evidence of pericardial effusion. Mitral Valve: The mitral valve is grossly normal. There is moderate thickening of the mitral valve leaflet(s). There is moderate calcification of the mitral valve leaflet(s). Trivial mitral valve regurgitation. Tricuspid Valve: The tricuspid valve is normal in structure. Tricuspid valve regurgitation is trivial. Aortic Valve: The aortic valve is grossly normal. Aortic valve regurgitation is trivial. Pulmonic Valve: The pulmonic valve was normal in structure. Pulmonic valve regurgitation is not visualized. Aorta: The aortic root and  ascending aorta are structurally normal, with no evidence of dilitation. Venous: The inferior vena cava is dilated in size with greater than 50% respiratory variability, suggesting right atrial pressure of 8 mmHg. IAS/Shunts: No atrial level shunt detected by color flow Doppler.  LEFT VENTRICLE PLAX 2D LVIDd:         5.01 cm  Diastology LVIDs:         3.36 cm  LV e' medial:    0.06 cm/s LV PW:         1.47 cm  LV E/e' medial:  16.6 LV IVS:        1.32 cm  LV e' lateral:   0.08 cm/s LVOT diam:     2.40 cm  LV E/e' lateral: 12.4 LV SV:         58 LV SV Index:   30 LVOT Area:     4.52 cm  RIGHT VENTRICLE RV Basal diam:  2.99 cm RV S prime:     8.05 cm/s TAPSE (M-mode): 2.4 cm LEFT ATRIUM             Index       RIGHT ATRIUM           Index LA diam:        4.10 cm 2.11 cm/m  RA Area:     21.50 cm LA Vol (A2C):   58.3 ml 29.93 ml/m RA Volume:   59.60 ml  30.60 ml/m LA Vol (A4C):   70.7 ml 36.30 ml/m LA Biplane Vol: 66.4 ml 34.09 ml/m  AORTIC VALVE LVOT Vmax:   73.10 cm/s LVOT Vmean:  48.500 cm/s LVOT VTI:    0.129 m  AORTA Ao Root diam: 3.50 cm MITRAL VALVE               TRICUSPID VALVE MV Area (PHT): 5.88 cm    TR Peak grad:   18.3 mmHg MV Decel Time: 129 msec    TR Vmax:        214.00 cm/s MV E velocity: 0.96 cm/s MV A velocity: 33.40 cm/s  SHUNTS MV E/A ratio:  0.03        Systemic VTI:  0.13 m                            Systemic Diam: 2.40 cm Mertie Moores MD Electronically signed by Mertie Moores MD Signature Date/Time: 05/11/2020/12:03:15 PM    Final      Scheduled Meds:   . apixaban  5 mg Oral BID  . atorvastatin  40 mg Oral Daily  . clopidogrel  75 mg Oral Daily  . diltiazem  30 mg Oral Q6H  . furosemide  40 mg Intravenous Q12H  . hydrALAZINE  25 mg Oral TID  . insulin aspart  0-5 Units Subcutaneous QHS  . insulin aspart  0-9 Units Subcutaneous TID WC  . metoprolol tartrate  100 mg Oral BID  . potassium chloride  40 mEq Oral Q4H    Continuous Infusions:     LOS: 0 days     Vernell Leep, MD, [place], [facility]    To contact the attending provider between 7A-7P or the covering provider during after hours 7P-7A, please log into the web site [email or web address] and access using universal [place] password for that web site. If you do not have the password, please call the hospital operator.  05/11/2020, 4:20 PM

## 2020-05-11 NOTE — Plan of Care (Signed)
  Problem: Education: Goal: Knowledge of General Education information will improve Description Including pain rating scale, medication(s)/side effects and non-pharmacologic comfort measures Outcome: Progressing   Problem: Health Behavior/Discharge Planning: Goal: Ability to manage health-related needs will improve Outcome: Progressing   

## 2020-05-11 NOTE — Progress Notes (Signed)
**Note De-identified  Obfuscation** EKG complete and placed in patient chart 

## 2020-05-12 DIAGNOSIS — E876 Hypokalemia: Secondary | ICD-10-CM

## 2020-05-12 DIAGNOSIS — I4891 Unspecified atrial fibrillation: Secondary | ICD-10-CM

## 2020-05-12 LAB — BASIC METABOLIC PANEL
Anion gap: 10 (ref 5–15)
BUN: 22 mg/dL (ref 8–23)
CO2: 29 mmol/L (ref 22–32)
Calcium: 9.1 mg/dL (ref 8.9–10.3)
Chloride: 100 mmol/L (ref 98–111)
Creatinine, Ser: 1.25 mg/dL — ABNORMAL HIGH (ref 0.61–1.24)
GFR, Estimated: 59 mL/min — ABNORMAL LOW (ref >60)
Glucose, Bld: 150 mg/dL — ABNORMAL HIGH (ref 70–99)
Potassium: 3.6 mmol/L (ref 3.5–5.1)
Sodium: 139 mmol/L (ref 135–145)

## 2020-05-12 LAB — GLUCOSE, CAPILLARY
Glucose-Capillary: 142 mg/dL — ABNORMAL HIGH (ref 70–99)
Glucose-Capillary: 214 mg/dL — ABNORMAL HIGH (ref 70–99)

## 2020-05-12 MED ORDER — DILTIAZEM HCL ER COATED BEADS 120 MG PO CP24: 120.0000 mg | ORAL_CAPSULE | Freq: Every day | ORAL | 0 refills | Status: DC

## 2020-05-12 MED ORDER — DILTIAZEM HCL ER COATED BEADS 120 MG PO CP24
120.0000 mg | ORAL_CAPSULE | Freq: Every day | ORAL | Status: DC
  Administered 2020-05-12: 120 mg via ORAL
  Filled 2020-05-12: qty 1

## 2020-05-12 MED ORDER — FUROSEMIDE 80 MG PO TABS
80.0000 mg | ORAL_TABLET | Freq: Every day | ORAL | Status: DC
  Administered 2020-05-12: 80 mg via ORAL
  Filled 2020-05-12: qty 1

## 2020-05-12 NOTE — Progress Notes (Signed)
Nsg Discharge Note  Admit Date:  05/10/2020 Discharge date: 05/12/2020   [name] to be D/C'd Home per MD order.  AVS completed.  Copy for chart, and copy for patient signed, and dated. Patient/caregiver able to verbalize understanding. Removed IV-CDI. Reviewed d/c paperwork with patient and wife. Answered all questions. Wheeled stable patient and belongings to main entrance where he was picked up by his wife to d/c to home. Discharge Medication: Allergies as of 05/12/2020      Reactions   Contrast Media [iodinated Diagnostic Agents] Anaphylaxis      Medication List    TAKE these medications   Accu-Chek Aviva Plus test strip Generic drug: glucose blood CHECK BLOOD SUGAR ONCE DAILY. DX E11.51   atorvastatin 40 MG tablet Commonly known as: LIPITOR TAKE 1 TABLET BY MOUTH EVERY DAY   Bevespi Aerosphere 9-4.8 MCG/ACT Aero Generic drug: Glycopyrrolate-Formoterol Inhale 2 puffs into the lungs 2 (two) times daily.   clopidogrel 75 MG tablet Commonly known as: PLAVIX TAKE 1 TABLET BY MOUTH EVERY DAY   diltiazem 120 MG 24 hr capsule Commonly known as: CARDIZEM CD Take 1 capsule (120 mg total) by mouth daily. Start taking on: May 13, 2020   Eliquis 5 MG Tabs tablet Generic drug: apixaban TAKE 1 TABLET BY MOUTH TWICE A DAY What changed: how much to take   furosemide 80 MG tablet Commonly known as: LASIX Take 1 tablet (80 mg total) by mouth daily.   hydrALAZINE 25 MG tablet Commonly known as: APRESOLINE TAKE 1 TABLET BY MOUTH THREE TIMES A DAY   levalbuterol 45 MCG/ACT inhaler Commonly known as: Xopenex HFA Inhale 1-2 puffs into the lungs every 6 (six) hours as needed for wheezing.   metFORMIN 1000 MG tablet Commonly known as: GLUCOPHAGE TAKE 1 TABLET TWICE DAILY WITH A MEAL What changed: See the new instructions.   metoprolol tartrate 100 MG tablet Commonly known as: LOPRESSOR Take 1 tablet (100 mg total) by mouth 2 (two) times daily.   nitroGLYCERIN 0.4 MG SL  tablet Commonly known as: NITROSTAT Place 1 tablet (0.4 mg total) under the tongue every 5 (five) minutes as needed for chest pain. X 3 doses   polyethylene glycol 17 g packet Commonly known as: MIRALAX / GLYCOLAX Take 17 g by mouth daily as needed for mild constipation.   [name] FlexTouch 100 UNIT/ML FlexTouch Pen Generic drug: insulin degludec INJECT 32 UNITS INTO THE SKIN AT BEDTIME.       Discharge Assessment: Vitals:   05/12/20 0434 05/12/20 0827  BP: (!) 154/99 (!) 179/87  Pulse: 76 76  Resp: 20   Temp: 97.7 F (36.5 C)   SpO2: 93%    Skin clean, dry and intact without evidence of skin break down, no evidence of skin tears noted. IV catheter discontinued intact. Site without signs and symptoms of complications - no redness or edema noted at insertion site, patient denies c/o pain - only slight tenderness at site.  Dressing with slight pressure applied.  D/c Instructions-Education: Discharge instructions given to patient/family with verbalized understanding. D/c education completed with patient/family including follow up instructions, medication list, d/c activities limitations if indicated, with other d/c instructions as indicated by MD - patient able to verbalize understanding, all questions fully answered. Patient instructed to return to ED, call 911, or call MD for any changes in condition.  Patient escorted via [place], and D/C home via private auto.  Santa Lighter, RN 05/12/2020 12:17 PM

## 2020-05-12 NOTE — Discharge Instructions (Signed)

## 2020-05-12 NOTE — Discharge Summary (Signed)
Physician Discharge Summary  Jason Fisher UMP:[id number] DOB: December 23, 1940  PCP: [name], [name], MD  Admitted from: Home Discharged to: Home  Admit date: 05/10/2020 Discharge date: 05/12/2020  Recommendations for Outpatient Follow-up:    Follow-up Information    [name], [name], MD. Schedule an appointment as soon as possible for a visit in 1 week(s).   Specialty: Internal Medicine Why: To be seen with repeat labs (CBC & BMP). Contact information: [place] [address] [phone]        Martinique, Peter M, MD. Schedule an appointment as soon as possible for a visit in 1 week(s).   Specialty: Cardiology Why: Please call office on Monday, 05/13/2020 for early follow-up appointment. Contact information: [place] [address] [phone]                Home Health: None    Equipment/Devices: None    Discharge Condition: Improved and stable.   Code Status: Full Code Diet recommendation:  Discharge Diet Orders (From admission, onward)    Start     Ordered   05/12/20 0000  Diet - low sodium heart healthy        05/12/20 0857   05/12/20 0000  Diet Carb Modified        05/12/20 0857           Discharge Diagnoses:  Principal Problem:   Acute exacerbation of CHF (congestive heart failure) ([facility]) Active Problems:   Essential hypertension   CAD S/P percutaneous coronary angioplasty   CKD (chronic kidney disease), stage III (HCC)   Hyperglycemia due to diabetes mellitus (HCC)   Elevated brain natriuretic peptide (BNP) level   Hypertensive urgency   Prolonged QT interval   Brief Summary: 80 year old male with medical history significant for but not limited to CAD (s/p BMS to RCA 2007, ISR with DES to RCA in 2009, DES to OM2 in 09/2015), chronic atrial fibrillation on Eliquis, chronic diastolic CHF, hypertension, hyperlipidemia, AAA s/P endovascular stenting 07/2016, COPD, DM 2 and tobacco abuse  presented to the ED via EMS due to progressively worsening dyspnea on exertion that started almost a month ago and worsened in the last several days prior to admission.  In the ED, tachypneic, BP 207/109, oxygen saturations 96-100% on supplemental oxygen 4 L/min.  He was admitted for acute on chronic diastolic CHF and A. fib with RVR.  Kindly refer to H&P for details of initial admission.   Assessment & Plan:   Acute on chronic diastolic CHF:  Possibly precipitated by high salt intake, hypertensive urgency and A. fib with RVR. Claims compliance with diuretics.  BNP 696.  Chest x-ray on admission suggestive of CHF.  S/p IV Lasix 80 mg x 1 in ED and then placed on IV Lasix 40 mg every 12 hours.  2D echo: LVEF 50-55%, no regional wall motion abnormalities, moderate LVH, LV diastolic function could not be evaluated.  Suspect intake output and weight recording not fully accurate.  Weight reduced from 178 pounds on 4/8 to 175 pounds on day of discharge 4/10.  Clinically improved.  Discontinued IV Lasix and resumed prior home dose of oral Lasix 80 mg daily.  Counseled extensively regarding importance of sodium restriction, and daily weights.  Close outpatient follow-up with his Cardiologist/PCP.  Chronic A. fib with RVR  Telemetry 4/9 morning showed A. fib with ventricular rate in the 90s-100s mostly but at times up to 120s prior to the a.m. metoprolol 100  mg dose.  Subsequent to his Metoprolol, rates improved to the 90s.  However with activity, dyspneic on mild exertion and RVR up to 120s-130s.  Continued prior home dose of metoprolol 100 mg twice daily.  Initiated Cardizem 30 mg every 6 hours.    Reviewed office visit with Dr. Martinique, Primary Cardiologist on 01/05/2020: Reportedly had issues with bradycardia while on diltiazem and metoprolol, diltiazem was stopped.  Continue apixaban.  Rate well controlled on above Cardizem in the 70s-80s.  No bradycardia or pauses noted on  telemetry.  Symptomatically patient feels much better.  Transitioned to Cardizem CD 120 mg daily.  Previously patient on was on much higher dose i.e. Cardizem CD 360 mg daily when he had issues with bradycardia leading to its discontinuation.   I discussed with EP Cardiologist on-call for [facility] today who agreed with above  Close outpatient follow-up with his Cardiologist.  Did not get a repeat TSH in the hospital, consider doing it outpatient.  Last one in March 2018: 0.424.  Hypokalemia  Replaced.  Magnesium 1.8.  Essential hypertension/hypertensive urgency  Presented with hypertensive urgency.  S/p IV labetalol 5 mg x 1 and hydralazine 10 mg x 1 in ED.  Now mildly uncontrolled.  Continue hydralazine 25 mg 3 times daily, metoprolol 100 mg twice daily and now newly on Cardizem.    Close outpatient follow-up.  Hyperlipidemia  Continue atorvastatin 40 mg daily.  Type II DM  Reasonably controlled on SSI while hospitalized.  Metformin and [name] currently on hold, resumed at discharge.  A1c 6.4 on 4/8 suggests good control.  CAD s/p multiple PCI's:  Denies anginal symptoms  Continue Plavix, metoprolol and atorvastatin.  Minimally elevated troponin with flat trend (25 > 23), likely secondary to demand ischemia from decompensated CHF, A. fib and chronic kidney disease.  Echo with normal EF and no wall motion abnormalities  History of AAA repair  Outpatient follow-up with vascular surgery.  Stage III a chronic kidney disease  Creatinine appears to be even better than his baseline 1.3-1.4.  Borderline prolonged QT interval  EKG on admission with QTC of 493 ms.  Replaced potassium.  Monitored on telemetry  Avoid QT prolonging medications.  Body mass index is 27.15 kg/m.        Consultants:   None  Procedures:   None   Discharge Instructions  Discharge Instructions    (HEART FAILURE PATIENTS) Call MD:  Anytime you have any of  the following symptoms: 1) 3 pound weight gain in 24 hours or 5 pounds in 1 week 2) shortness of breath, with or without a dry hacking cough 3) swelling in the hands, feet or stomach 4) if you have to sleep on extra pillows at night in order to breathe.   Complete by: As directed    Call MD for:  difficulty breathing, headache or visual disturbances   Complete by: As directed    Call MD for:  extreme fatigue   Complete by: As directed    Call MD for:  persistant dizziness or light-headedness   Complete by: As directed    Diet - low sodium heart healthy   Complete by: As directed    Diet Carb Modified   Complete by: As directed    Increase activity slowly   Complete by: As directed        Medication List    TAKE these medications   Accu-Chek Aviva Plus test strip Generic drug: glucose blood CHECK BLOOD SUGAR ONCE DAILY. DX  E11.51   atorvastatin 40 MG tablet Commonly known as: LIPITOR TAKE 1 TABLET BY MOUTH EVERY DAY   Bevespi Aerosphere 9-4.8 MCG/ACT Aero Generic drug: Glycopyrrolate-Formoterol Inhale 2 puffs into the lungs 2 (two) times daily.   clopidogrel 75 MG tablet Commonly known as: PLAVIX TAKE 1 TABLET BY MOUTH EVERY DAY   diltiazem 120 MG 24 hr capsule Commonly known as: CARDIZEM CD Take 1 capsule (120 mg total) by mouth daily. Start taking on: May 13, 2020   Eliquis 5 MG Tabs tablet Generic drug: apixaban TAKE 1 TABLET BY MOUTH TWICE A DAY What changed: how much to take   furosemide 80 MG tablet Commonly known as: LASIX Take 1 tablet (80 mg total) by mouth daily.   hydrALAZINE 25 MG tablet Commonly known as: APRESOLINE TAKE 1 TABLET BY MOUTH THREE TIMES A DAY   levalbuterol 45 MCG/ACT inhaler Commonly known as: Xopenex HFA Inhale 1-2 puffs into the lungs every 6 (six) hours as needed for wheezing.   metFORMIN 1000 MG tablet Commonly known as: GLUCOPHAGE TAKE 1 TABLET TWICE DAILY WITH A MEAL What changed: See the new instructions.   metoprolol  tartrate 100 MG tablet Commonly known as: LOPRESSOR Take 1 tablet (100 mg total) by mouth 2 (two) times daily.   nitroGLYCERIN 0.4 MG SL tablet Commonly known as: NITROSTAT Place 1 tablet (0.4 mg total) under the tongue every 5 (five) minutes as needed for chest pain. X 3 doses   polyethylene glycol 17 g packet Commonly known as: MIRALAX / GLYCOLAX Take 17 g by mouth daily as needed for mild constipation.   [name] FlexTouch 100 UNIT/ML FlexTouch Pen Generic drug: insulin degludec INJECT 32 UNITS INTO THE SKIN AT BEDTIME.      Allergies  Allergen Reactions  . Contrast Media [Iodinated Diagnostic Agents] Anaphylaxis      Procedures/Studies: DG Chest Portable 1 View  Result Date: 05/10/2020 CLINICAL DATA:  Shortness of breath.  Current smoker. EXAM: PORTABLE CHEST 1 VIEW COMPARISON:  Most recent radiograph 10/30/2019. FINDINGS: Mild cardiomegaly. Aortic atherosclerosis. The lungs are hyperinflated. Blunting of the costophrenic angles, left greater than right. Findings suspicious for small left pleural effusion. Blunting of right costophrenic angles felt to be related to hyperinflation. Increased bronchial thickening from prior. No pneumothorax. Stable osseous structures. IMPRESSION: 1. Increased peribronchial thickening from prior exam. This may be bronchitic or congestive. 2. Cardiomegaly. Left pleural effusion. Blunting of right costophrenic angles felt to be related to hyperinflation rather than effusion. 3. Chronic hyperinflation. Aortic Atherosclerosis (ICD10-I70.0). Electronically Signed   By: Keith Rake M.D.   On: 05/10/2020 21:23   ECHOCARDIOGRAM COMPLETE  Result Date: 05/11/2020    ECHOCARDIOGRAM REPORT   Patient Name:   Jason Fisher Date of Exam: 05/11/2020 Medical Rec #:  [id number]      Height:       68.0 in Accession #:    [id number]     Weight:       178.6 lb Date of Birth:  1940-09-20      BSA:          1.948 m Patient Age:    80 years       BP:           159/92 mmHg  Patient Gender: M              HR:           100 bpm. Exam Location:  [facility] Procedure: 2D Echo, Cardiac Doppler and Color Doppler  Indications:    CHF  History:        Patient has prior history of Echocardiogram examinations, most                 recent 11/05/2017. CHF, CAD and Previous Myocardial Infarction,                 COPD, Arrythmias:Atrial Fibrillation, Signs/Symptoms:Shortness                 of Breath; Risk Factors:Diabetes, Hypertension, Dyslipidemia and                 Former Smoker.  Sonographer:    Dustin Flock RDCS Referring Phys: [id number] OLADAPO ADEFESO IMPRESSIONS  1. Left ventricular ejection fraction, by estimation, is 50 to 55%. The left ventricle has low normal function. The left ventricle has no regional wall motion abnormalities. There is moderate left ventricular hypertrophy. Left ventricular diastolic function could not be evaluated.  2. Right ventricular systolic function is normal. The right ventricular size is normal. There is normal pulmonary artery systolic pressure.  3. Left atrial size was mildly dilated.  4. The mitral valve is grossly normal. Trivial mitral valve regurgitation.  5. The aortic valve is grossly normal. Aortic valve regurgitation is trivial.  6. The inferior vena cava is dilated in size with >50% respiratory variability, suggesting right atrial pressure of 8 mmHg. FINDINGS  Left Ventricle: Left ventricular ejection fraction, by estimation, is 50 to 55%. The left ventricle has low normal function. The left ventricle has no regional wall motion abnormalities. The left ventricular internal cavity size was normal in size. There is moderate left ventricular hypertrophy. Left ventricular diastolic function could not be evaluated due to atrial fibrillation. Left ventricular diastolic function could not be evaluated. Right Ventricle: The right ventricular size is normal. No increase in right ventricular wall thickness. Right ventricular systolic function is normal.  There is normal pulmonary artery systolic pressure. The tricuspid regurgitant velocity is 2.14 m/s, and  with an assumed right atrial pressure of 3 mmHg, the estimated right ventricular systolic pressure is 86.7 mmHg. Left Atrium: Left atrial size was mildly dilated. Right Atrium: Right atrial size was normal in size. Pericardium: There is no evidence of pericardial effusion. Mitral Valve: The mitral valve is grossly normal. There is moderate thickening of the mitral valve leaflet(s). There is moderate calcification of the mitral valve leaflet(s). Trivial mitral valve regurgitation. Tricuspid Valve: The tricuspid valve is normal in structure. Tricuspid valve regurgitation is trivial. Aortic Valve: The aortic valve is grossly normal. Aortic valve regurgitation is trivial. Pulmonic Valve: The pulmonic valve was normal in structure. Pulmonic valve regurgitation is not visualized. Aorta: The aortic root and ascending aorta are structurally normal, with no evidence of dilitation. Venous: The inferior vena cava is dilated in size with greater than 50% respiratory variability, suggesting right atrial pressure of 8 mmHg. IAS/Shunts: No atrial level shunt detected by color flow Doppler.  LEFT VENTRICLE PLAX 2D LVIDd:         5.01 cm  Diastology LVIDs:         3.36 cm  LV e' medial:    0.06 cm/s LV PW:         1.47 cm  LV E/e' medial:  16.6 LV IVS:        1.32 cm  LV e' lateral:   0.08 cm/s LVOT diam:     2.40 cm  LV E/e' lateral: 12.4 LV SV:  58 LV SV Index:   30 LVOT Area:     4.52 cm  RIGHT VENTRICLE RV Basal diam:  2.99 cm RV S prime:     8.05 cm/s TAPSE (M-mode): 2.4 cm LEFT ATRIUM             Index       RIGHT ATRIUM           Index LA diam:        4.10 cm 2.11 cm/m  RA Area:     21.50 cm LA Vol (A2C):   58.3 ml 29.93 ml/m RA Volume:   59.60 ml  30.60 ml/m LA Vol (A4C):   70.7 ml 36.30 ml/m LA Biplane Vol: 66.4 ml 34.09 ml/m  AORTIC VALVE LVOT Vmax:   73.10 cm/s LVOT Vmean:  48.500 cm/s LVOT VTI:    0.129  m  AORTA Ao Root diam: 3.50 cm MITRAL VALVE               TRICUSPID VALVE MV Area (PHT): 5.88 cm    TR Peak grad:   18.3 mmHg MV Decel Time: 129 msec    TR Vmax:        214.00 cm/s MV E velocity: 0.96 cm/s MV A velocity: 33.40 cm/s  SHUNTS MV E/A ratio:  0.03        Systemic VTI:  0.13 m                            Systemic Diam: 2.40 cm Mertie Moores MD Electronically signed by Mertie Moores MD Signature Date/Time: 05/11/2020/12:03:15 PM    Final       Subjective: Patient reports feeling much better.  Breathing significantly improved and denies dyspnea.  No chest pain, palpitations, dizziness or lightheadedness.  Patient subsequently ambulated with RN, denied dyspnea and oxygen saturations ranged between 90-94% on room air.  Discharge Exam:  Vitals:   05/11/20 2053 05/12/20 0434 05/12/20 0500 05/12/20 0827  BP: (!) 162/97 (!) 154/99  (!) 179/87  Pulse: 98 76  76  Resp: 20 20    Temp: 97.6 F (36.4 C) 97.7 F (36.5 C)    TempSrc: Oral Oral    SpO2: 94% 93%    Weight:   79.7 kg   Height:         General exam: Elderly male, moderately built and nourished seen ambulating comfortably in the halls around the nursing station. Respiratory system:  Clear to auscultation.  No increased work of breathing. Cardiovascular system:  S1 and S2 heard, irregularly irregular.  No JVD, pedal edema or murmurs.  Telemetry personally reviewed: A. fib with controlled ventricular rate in the 70s-80s. Gastrointestinal system: Abdomen is nondistended, soft and nontender. No organomegaly or masses felt. Normal bowel sounds heard. Central nervous system: Alert and oriented. No focal neurological deficits. Extremities: Symmetric 5 x 5 power. Skin: No rashes, lesions or ulcers Psychiatry: Judgement and insight appear normal. Mood & affect appropriate.      The results of significant diagnostics from this hospitalization (including imaging, microbiology, ancillary and laboratory) are listed below for reference.      Microbiology: Recent Results (from the past 240 hour(s))  Resp Panel by RT-PCR (Flu A&B, Covid) Nasopharyngeal Swab     Status: None   Collection Time: 05/10/20 11:33 PM   Specimen: Nasopharyngeal Swab; Nasopharyngeal(NP) swabs in vial transport medium  Result Value Ref Range Status   SARS Coronavirus 2 by RT PCR NEGATIVE  NEGATIVE Final    Comment: (NOTE) SARS-CoV-2 target nucleic acids are NOT DETECTED.  The SARS-CoV-2 RNA is generally detectable in upper respiratory specimens during the acute phase of infection. The lowest concentration of SARS-CoV-2 viral copies this assay can detect is 138 copies/mL. A negative result does not preclude SARS-Cov-2 infection and should not be used as the sole basis for treatment or other patient management decisions. A negative result may occur with  improper specimen collection/handling, submission of specimen other than nasopharyngeal swab, presence of viral mutation(s) within the areas targeted by this assay, and inadequate number of viral copies(<138 copies/mL). A negative result must be combined with clinical observations, patient history, and epidemiological information. The expected result is Negative.  Fact Sheet for Patients:  EntrepreneurPulse.com.au  Fact Sheet for Healthcare Providers:  IncredibleEmployment.be  This test is no t yet approved or cleared by the [place] FDA and  has been authorized for detection and/or diagnosis of SARS-CoV-2 by FDA under an Emergency Use Authorization (EUA). This EUA will remain  in effect (meaning this test can be used) for the duration of the COVID-19 declaration under Section 564(b)(1) of the Act, 21 U.S.C.section 360bbb-3(b)(1), unless the authorization is terminated  or revoked sooner.       Influenza A by PCR NEGATIVE NEGATIVE Final   Influenza B by PCR NEGATIVE NEGATIVE Final    Comment: (NOTE) The Xpert Xpress SARS-CoV-2/FLU/RSV plus assay  is intended as an aid in the diagnosis of influenza from Nasopharyngeal swab specimens and should not be used as a sole basis for treatment. Nasal washings and aspirates are unacceptable for Xpert Xpress SARS-CoV-2/FLU/RSV testing.  Fact Sheet for Patients: EntrepreneurPulse.com.au  Fact Sheet for Healthcare Providers: IncredibleEmployment.be  This test is not yet approved or cleared by the [place] FDA and has been authorized for detection and/or diagnosis of SARS-CoV-2 by FDA under an Emergency Use Authorization (EUA). This EUA will remain in effect (meaning this test can be used) for the duration of the COVID-19 declaration under Section 564(b)(1) of the Act, 21 U.S.C. section 360bbb-3(b)(1), unless the authorization is terminated or revoked.  Performed at [facility], [address] [place], [place] [address]      Labs: CBC: Recent Labs  Lab 05/10/20 2326 05/11/20 0612  WBC 9.1 10.4  NEUTROABS 6.4  --   HGB 12.0* 12.9*  HCT 37.6* 40.7  MCV 89.3 88.7  PLT 168 676    Basic Metabolic Panel: Recent Labs  Lab 05/10/20 2326 05/11/20 0612 05/12/20 0638  NA 141 143 139  K 3.7 3.0* 3.6  CL 106 104 100  CO2 26 27 29   GLUCOSE 143* 146* 150*  BUN 22 22 22   CREATININE 1.49* 1.38* 1.25*  CALCIUM 8.7* 8.8* 9.1  MG  --  1.8  --   PHOS  --  3.4  --     Liver Function Tests: Recent Labs  Lab 05/10/20 2326 05/11/20 0612  AST 31 28  ALT 26 27  ALKPHOS 89 88  BILITOT 1.1 1.3*  PROT 7.1 7.5  ALBUMIN 3.6 3.9    CBG: Recent Labs  Lab 05/11/20 1113 05/11/20 1614 05/11/20 2059 05/12/20 0734 05/12/20 1112  GLUCAP 164* 142* 194* 142* 214*    Hgb A1c Recent Labs    05/10/20 1037  HGBA1C 6.4*     Time coordinating discharge: 25 minutes  SIGNED:  Vernell Leep, MD, [place], [facility]. [facility]  To contact the attending provider between 7A-7P or the covering provider during after hours  7P-7A, please log  into the web site [email or web address] and access using universal [place] password for that web site. If you do not have the password, please call the hospital operator.

## 2020-05-13 ENCOUNTER — Telehealth: Payer: Self-pay

## 2020-05-13 NOTE — Telephone Encounter (Cosign Needed)
Transition Care Management Follow-up Telephone Call  Date of discharge and from where: 05/11/2020  How have you been since you were released from the hospital? better  Any questions or concerns? No  Items Reviewed:  Did the pt receive and understand the discharge instructions provided? Yes   Medications obtained and verified? Yes   Other? Yes   Any new allergies since your discharge? No   Dietary orders reviewed? Yes  Do you have support at home? Yes   Home Care and Equipment/Supplies: Were home health services ordered? no If so, what is the name of the agency? n/a  Has the agency set up a time to come to the patient's home? no Were any new equipment or medical supplies ordered?  No What is the name of the medical supply agency? n/a Were you able to get the supplies/equipment? not applicable Do you have any questions related to the use of the equipment or supplies? No  Functional Questionnaire: (I = Independent and D = Dependent) ADLs: I  Bathing/Dressing- I  Meal Prep- I  Eating- I  Maintaining continence- I  Transferring/Ambulation- I  Managing Meds- I  Follow up appointments reviewed:   PCP Hospital f/u appt confirmed? Yes  Scheduled to see Dr. Jerilee Hoh on 05/24/2020@ 1130.  [facility] f/u appt confirmed? Yes  Scheduled to see Dr. Salli Real 06/13/2020@ 1230.  Are transportation arrangements needed? No   If their condition worsens, is the pt aware to call PCP or go to the Emergency Dept.? Yes  Was the patient provided with contact information for the PCP's office or ED? Yes  Was to pt encouraged to call back with questions or concerns? Yes

## 2020-05-23 ENCOUNTER — Other Ambulatory Visit: Payer: Self-pay

## 2020-05-24 ENCOUNTER — Encounter: Payer: Self-pay | Admitting: Internal Medicine

## 2020-05-24 ENCOUNTER — Ambulatory Visit (INDEPENDENT_AMBULATORY_CARE_PROVIDER_SITE_OTHER): Payer: Medicare Other | Admitting: Internal Medicine

## 2020-05-24 ENCOUNTER — Ambulatory Visit: Payer: Medicare Other | Admitting: Internal Medicine

## 2020-05-24 VITALS — BP 140/100 | HR 78 | Temp 97.6°F | Wt 171.7 lb

## 2020-05-24 DIAGNOSIS — I160 Hypertensive urgency: Secondary | ICD-10-CM

## 2020-05-24 DIAGNOSIS — I5033 Acute on chronic diastolic (congestive) heart failure: Secondary | ICD-10-CM | POA: Diagnosis not present

## 2020-05-24 DIAGNOSIS — Z09 Encounter for follow-up examination after completed treatment for conditions other than malignant neoplasm: Secondary | ICD-10-CM | POA: Diagnosis not present

## 2020-05-24 NOTE — Patient Instructions (Signed)
-  Nice seeing you today!!  -Make sure you bring your medications to your next visit.  -Schedule follow up in 6 weeks for your blood pressure.

## 2020-05-24 NOTE — Progress Notes (Signed)
Established Patient Office Visit     This visit occurred during the SARS-CoV-2 public health emergency.  Safety protocols were in place, including screening questions prior to the visit, additional usage of staff PPE, and extensive cleaning of exam room while observing appropriate contact time as indicated for disinfecting solutions.    CC/Reason for Visit: Hospital discharge follow-up  HPI: Jason Fisher is a 80 y.o. male who is coming in today for the above mentioned reasons. Past Medical History is significant for: Chronic diastolic heart failure, benign essential hypertension,AAA,type 2 diabetes, chronic atrial fibrillation, chronic kidney disease stage III.  I saw him in the office on April 8.  At that time he was noted to have markedly elevated blood pressure of 200/120.  He was asymptomatic at that time, medication noncompliance was suspected.  A 2-week follow-up visit was scheduled.  In the interim he had to go to the emergency room due to shortness of breath.  He was found to have an acute exacerbation of CHF/flash pulmonary edema possibly related to hypertensive urgency.  His home medications were continued which include metoprolol 100 mg twice daily, hydralazine 25 mg 3 times a day and Lasix 80 mg daily.  Cardizem 120 mg daily was added.  During that hospitalization he was diuresed, in fact he is down 7 pounds from his last office visit, he did have some A. fib with RVR.  His blood pressure in office today is 140/100.  He is feeling much improved.   Past Medical/Surgical History: Past Medical History:  Diagnosis Date  . 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 BENIGN PROSTATIC HYPERTROPHY, HX OF 02/24/2007  . CAD S/P PCI    a. 2007 Inf STEMI s/p Vision BMS  2 to RCA;  b. 2009 ISR Prox RCA Stent-->with DES;  c. 09/2015 PCI: LM nl, LAD 30p/m, D1 90, LCX nl, OM1 small, 80, OM2 90 (2.5x14 Biofreedom stent), OM3 small, RCA 30p ISR, 68m ISR, 52m, 85d, EF 50-55%.  . 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 Chronic atrial fibrillation ([place]) 11/06/2009   a. On Eliquis (CHA2DS2VASc = 5).  Rate controlled w/ BB and CCB.  Marland Kitchen Chronic diastolic CHF (congestive heart failure) ([place])    a. 09/2015 Echo: EF 55-60%, mildly dil LA.  . CKD (chronic kidney disease), stage III ([place])    they mentioned this to the patient but they would work on it later after the aneurysm  . COLONIC POLYPS, HX OF 11/02/2006  . COPD (chronic obstructive pulmonary disease) ([place])   . DIABETES MELLITUS, TYPE II 10/29/2006  . Diverticulosis   . History of tobacco abuse    a. Quit 09/2015.  Marland Kitchen HYPERLIPIDEMIA 10/29/2006  . Hypertension   . Hypertensive heart disease with heart failure ([place])   . Pneumonia 09/2015   [name] 07/31/2016  . STEMI (ST elevation myocardial infarction) [facility]) 2007   [name] 07/31/2016    Past Surgical History:  Procedure Laterality Date  . ABDOMINAL AORTIC ENDOVASCULAR STENT GRAFT N/A 07/31/2016   Procedure: ABDOMINAL AORTIC ENDOVASCULAR STENT GRAFT;  Surgeon: Serafina Mitchell, MD;  Location: [facility];  Service: Vascular;  Laterality: N/A;  . CARDIAC CATHETERIZATION N/A 09/26/2015   Procedure: Left Heart Cath and Coronary Angiography;  Surgeon: Peter M Martinique, MD;  Location: [facility];  Service: Cardiovascular;  Laterality: N/A;  . CARDIAC CATHETERIZATION N/A 09/26/2015   Procedure: Coronary Stent Intervention;  Surgeon: Peter M Martinique, MD;  Location: [place] [facility];  Service:  Cardiovascular;  Laterality: N/A;  . CATARACT EXTRACTION Bilateral   . COLONOSCOPY    . CORONARY ANGIOPLASTY WITH STENT PLACEMENT  2007   Inferior STEMI 2007 - RCA PCI with 2 Vision BMS; Abnormal Myoview in 2009 - pRCA ins-stent restenosis & unstented disease - DES PCI Promus 3.0 x 20 mm (3.5 mm)  . ENDOVASCULAR REPAIR/STENT GRAFT  07/31/2016  . INCISION AND DRAINAGE PERIRECTAL ABSCESS N/A 11/06/2015   Procedure: IRRIGATION AND DEBRIDEMENT PERIRECTAL ABSCESS, POSSIBLE HEMORRHOIDECTOMY;  Surgeon: Coralie Keens, MD;  Location: [facility];  Service: General;  Laterality: N/A;  . ORIF SHOULDER FRACTURE Right    "fell out of back end of a truck"    Social History:  reports that he has been smoking cigarettes. He has a 49.00 pack-year smoking history. He has never used smokeless tobacco. He reports that he does not drink alcohol and does not use drugs.  Allergies: Allergies  Allergen Reactions  . Contrast Media [Iodinated Diagnostic Agents] Anaphylaxis    Family History:  Family History  Problem Relation Age of Onset  . Heart attack Mother   . Diabetes Mother   . Heart attack Father   . Hypertension Father   . Heart attack Brother   . Diabetes Brother      Current Outpatient Medications:  .  ACCU-CHEK AVIVA PLUS test strip, CHECK BLOOD SUGAR ONCE DAILY. DX E11.51, Disp: 100 strip, Rfl: 2 .  atorvastatin (LIPITOR) 40 MG tablet, TAKE 1 TABLET BY MOUTH EVERY DAY (Patient taking differently: Take 40 mg by mouth daily.), Disp: 90 tablet, Rfl: 1 .  clopidogrel (PLAVIX) 75 MG tablet, TAKE 1 TABLET BY MOUTH EVERY DAY (Patient taking differently: Take 75 mg by mouth daily.), Disp: 90 tablet, Rfl: 3 .  diltiazem (CARDIZEM CD) 120 MG 24 hr capsule, Take 1 capsule (120 mg total) by mouth daily., Disp: 30 capsule, Rfl: 0 .  ELIQUIS 5 MG TABS tablet, TAKE 1 TABLET BY MOUTH TWICE A DAY (Patient taking differently: Take 5 mg by mouth 2 (two) times daily.), Disp: 60 tablet, Rfl: 4 .  furosemide (LASIX) 80 MG tablet, Take 1 tablet (80 mg total) by mouth daily., Disp: 180 tablet, Rfl: 1 .  Glycopyrrolate-Formoterol (BEVESPI AEROSPHERE) 9-4.8 MCG/ACT AERO, Inhale 2 puffs into the lungs 2 (two) times daily., Disp: 5.9 g, Rfl: 5 .  hydrALAZINE (APRESOLINE) 25 MG tablet, TAKE 1 TABLET BY MOUTH THREE TIMES A DAY (Patient taking differently: Take 25 mg by mouth 3 (three) times daily.), Disp: 270 tablet, Rfl: 1 .  levalbuterol (XOPENEX HFA) 45 MCG/ACT inhaler, Inhale 1-2 puffs into the lungs every 6 (six) hours as  needed for wheezing., Disp: 1 Inhaler, Rfl: 3 .  metFORMIN (GLUCOPHAGE) 1000 MG tablet, TAKE 1 TABLET TWICE DAILY WITH A MEAL (Patient taking differently: Take 1,000 mg by mouth 2 (two) times daily with a meal. TAKE 1 TABLET TWICE DAILY WITH A MEAL), Disp: 180 tablet, Rfl: 4 .  metoprolol tartrate (LOPRESSOR) 100 MG tablet, Take 1 tablet (100 mg total) by mouth 2 (two) times daily., Disp: 180 tablet, Rfl: 3 .  nitroGLYCERIN (NITROSTAT) 0.4 MG SL tablet, Place 1 tablet (0.4 mg total) under the tongue every 5 (five) minutes as needed for chest pain. X 3 doses, Disp: 25 tablet, Rfl: 4 .  polyethylene glycol (MIRALAX / GLYCOLAX) packet, Take 17 g by mouth daily as needed for mild constipation., Disp: , Rfl:  .  TRESIBA FLEXTOUCH 100 UNIT/ML FlexTouch Pen, INJECT 32 UNITS INTO THE SKIN AT  BEDTIME. (Patient taking differently: Inject 32 Units into the skin at bedtime.), Disp: 15 mL, Rfl: 2  Review of Systems:  Constitutional: Denies fever, chills, diaphoresis, appetite change and fatigue.  HEENT: Denies photophobia, eye pain, redness, hearing loss, ear pain, congestion, sore throat, rhinorrhea, sneezing, mouth sores, trouble swallowing, neck pain, neck stiffness and tinnitus.   Respiratory: Denies SOB, DOE, cough, chest tightness,  and wheezing.   Cardiovascular: Denies chest pain, palpitations and leg swelling.  Gastrointestinal: Denies nausea, vomiting, abdominal pain, diarrhea, constipation, blood in stool and abdominal distention.  Genitourinary: Denies dysuria, urgency, frequency, hematuria, flank pain and difficulty urinating.  Endocrine: Denies: hot or cold intolerance, sweats, changes in hair or nails, polyuria, polydipsia. Musculoskeletal: Denies myalgias, back pain, joint swelling, arthralgias and gait problem.  Skin: Denies pallor, rash and wound.  Neurological: Denies dizziness, seizures, syncope, weakness, light-headedness, numbness and headaches.  Hematological: Denies adenopathy. Easy  bruising, personal or family bleeding history  Psychiatric/Behavioral: Denies suicidal ideation, mood changes, confusion, nervousness, sleep disturbance and agitation    Physical Exam: Vitals:   05/24/20 1152  BP: (!) 140/100  Pulse: 78  Temp: 97.6 F (36.4 C)  TempSrc: Oral  SpO2: 97%  Weight: 171 lb 11.2 oz (77.9 kg)    Body mass index is 26.11 kg/m.   Constitutional: NAD, calm, comfortable Eyes: PERRL, lids and conjunctivae normal, wears corrective lenses ENMT: Mucous membranes are moist. Respiratory: clear to auscultation bilaterally, no wheezing, no crackles. Normal respiratory effort. No accessory muscle use.  Cardiovascular: Regular rate and rhythm, no murmurs / rubs / gallops. No extremity edema.  Neurologic: Grossly intact and nonfocal Psychiatric: Normal judgment and insight. Alert and oriented x 3. Normal mood.    Impression and Plan:  Hospital discharge follow-up  Acute on chronic diastolic congestive heart failure (HCC)  Hypertensive urgency  -He has much improved since hospital discharge, he is down 7 pounds.  His blood pressure is improved although still not at goal.  Given recent medication changes I will not make any more today and will instead have him follow-up in 6 weeks for continued blood pressure monitoring.  He has been asked to do ambulatory blood pressure measurements and to bring  his medications with him.  Time spent: 32 minutes reviewing hospital charts, examining patient and discussing plan of care.    Patient Instructions  -Nice seeing you today!!  -Make sure you bring your medications to your next visit.  -Schedule follow up in 6 weeks for your blood pressure.     Lelon Frohlich, MD [facility] at [facility]

## 2020-06-07 NOTE — Progress Notes (Deleted)
Cardiology Office Note    Date:  06/07/2020   ID:  Jason Fisher, DOB January 08, 1941, MRN [id number]  PCP:  Jason Fisher, Jason Halsted, MD  Cardiologist:  Dr. Martinique   No chief complaint on file.   History of Present Illness:  Jason Fisher is a 80 y.o. male seen for evaluation of SOB and fatigue. He has a PMH of CAD (s/p BMS to RCA 2007, ISR with DES to RCA in 2009, DES to OM2 in 09/2015),chronic atrial fibrillation on Eliquis, chronic diastolic heart failure,HTN, HLD, AAA, COPD,and tobacco use. He was previously admitted in March 2018 for atrial fibrillation with RVR. Troponin was borderline elevated. He was initially treated with IV Cardizem and later switched to p.o. Cardizem prior to discharge. He also underwent AAA endovascular stent grafting by Dr. Trula Fisher in June 2018. He was seen in October 2019 by Jason Deforest PA-C for increased edema.  Lasix was increased to 80 mg twice daily for 3 days before decreasing back down to 40 mg twice daily thereafter.  Echocardiogram on 11/05/2017 this showed EF 60 to 65%, mildly increased aortic root measuring 40 mm, moderately dilated left and the right atrium.   When seen in September he was not feeling well. He has a lot of congestion in his chest with clear phlegm. Had been using Dayquil. He was more SOB. No increase in edema or weight. He  felt very tired and is dizzy. Had one episode of chest pain relieved with sl Ntg. He was found to be markedly bradycardic with HR 42. Diltiazem was stopped and metoprolol dose reduced. On follow up one week later HR was 119 in Afib. Metoprolol was increased back to 100 mg bid. CXR showed mild pleural effusions suggestive of mild CHF. Emphysema.   He was admitted in April with acute CHF exacerbation. He had Afib with RVR and was severely hypertensive. Noted high sodium diet. He was diuresed. Added Diltiazem 120 mg daily. Diuresed with IV lasix. BNP 890.   On follow up today he is feeling better. Energy is improved.  Cough resolved. Breathing is better. No dizziness now. No chest pain.    Past Medical History:  Diagnosis Date  . 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 BENIGN PROSTATIC HYPERTROPHY, HX OF 02/24/2007  . CAD S/P PCI    a. 2007 Inf STEMI s/p Vision BMS  2 to RCA;  b. 2009 ISR Prox RCA Stent-->with DES;  c. 09/2015 PCI: LM nl, LAD 30p/m, D1 90, LCX nl, OM1 small, 80, OM2 90 (2.5x14 Biofreedom stent), OM3 small, RCA 30p ISR, 37m ISR, 60m, 85d, EF 50-55%.  . 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 Chronic atrial fibrillation ([place]) 11/06/2009   a. On Eliquis (CHA2DS2VASc = 5).  Rate controlled w/ BB and CCB.  Marland Kitchen Chronic diastolic CHF (congestive heart failure) ([place])    a. 09/2015 Echo: EF 55-60%, mildly dil LA.  . CKD (chronic kidney disease), stage III ([place])    they mentioned this to the patient but they would work on it later after the aneurysm  . COLONIC POLYPS, HX OF 11/02/2006  . COPD (chronic obstructive pulmonary disease) ([place])   . DIABETES MELLITUS, TYPE II 10/29/2006  . Diverticulosis   . History of tobacco abuse    a. Quit 09/2015.  Marland Kitchen HYPERLIPIDEMIA 10/29/2006  . Hypertension   . Hypertensive heart disease with heart failure ([place])   . Pneumonia 09/2015   [name] 07/31/2016  . STEMI (ST elevation myocardial infarction) ([place]) 2007   /  notes 07/31/2016    Past Surgical History:  Procedure Laterality Date  . ABDOMINAL AORTIC ENDOVASCULAR STENT GRAFT N/A 07/31/2016   Procedure: ABDOMINAL AORTIC ENDOVASCULAR STENT GRAFT;  Surgeon: Jason Mitchell, MD;  Location: [facility];  Service: Vascular;  Laterality: N/A;  . CARDIAC CATHETERIZATION N/A 09/26/2015   Procedure: Left Heart Cath and Coronary Angiography;  Surgeon: Jason Fisher M Martinique, MD;  Location: [facility] [facility];  Service: Cardiovascular;  Laterality: N/A;  . CARDIAC CATHETERIZATION N/A 09/26/2015   Procedure: Coronary Stent Intervention;  Surgeon: Jason Fisher M Martinique, MD;  Location: [facility];  Service: Cardiovascular;  Laterality: N/A;  .  CATARACT EXTRACTION Bilateral   . COLONOSCOPY    . CORONARY ANGIOPLASTY WITH STENT PLACEMENT  2007   Inferior STEMI 2007 - RCA PCI with 2 Vision BMS; Abnormal Myoview in 2009 - pRCA ins-stent restenosis & unstented disease - DES PCI Promus 3.0 x 20 mm (3.5 mm)  . ENDOVASCULAR REPAIR/STENT GRAFT  07/31/2016  . INCISION AND DRAINAGE PERIRECTAL ABSCESS N/A 11/06/2015   Procedure: IRRIGATION AND DEBRIDEMENT PERIRECTAL ABSCESS, POSSIBLE HEMORRHOIDECTOMY;  Surgeon: Coralie Keens, MD;  Location: [facility];  Service: General;  Laterality: N/A;  . ORIF SHOULDER FRACTURE Right    "fell out of back end of a truck"    Current Medications: Outpatient Medications Prior to Visit  Medication Sig Dispense Refill  . ACCU-CHEK AVIVA PLUS test strip CHECK BLOOD SUGAR ONCE DAILY. DX E11.51 100 strip 2  . atorvastatin (LIPITOR) 40 MG tablet TAKE 1 TABLET BY MOUTH EVERY DAY (Patient taking differently: Take 40 mg by mouth daily.) 90 tablet 1  . clopidogrel (PLAVIX) 75 MG tablet TAKE 1 TABLET BY MOUTH EVERY DAY (Patient taking differently: Take 75 mg by mouth daily.) 90 tablet 3  . diltiazem (CARDIZEM CD) 120 MG 24 hr capsule Take 1 capsule (120 mg total) by mouth daily. 30 capsule 0  . ELIQUIS 5 MG TABS tablet TAKE 1 TABLET BY MOUTH TWICE A DAY (Patient taking differently: Take 5 mg by mouth 2 (two) times daily.) 60 tablet 4  . furosemide (LASIX) 80 MG tablet Take 1 tablet (80 mg total) by mouth daily. 180 tablet 1  . Glycopyrrolate-Formoterol (BEVESPI AEROSPHERE) 9-4.8 MCG/ACT AERO Inhale 2 puffs into the lungs 2 (two) times daily. 5.9 g 5  . hydrALAZINE (APRESOLINE) 25 MG tablet TAKE 1 TABLET BY MOUTH THREE TIMES A DAY (Patient taking differently: Take 25 mg by mouth 3 (three) times daily.) 270 tablet 1  . levalbuterol (XOPENEX HFA) 45 MCG/ACT inhaler Inhale 1-2 puffs into the lungs every 6 (six) hours as needed for wheezing. 1 Inhaler 3  . metFORMIN (GLUCOPHAGE) 1000 MG tablet TAKE 1 TABLET TWICE DAILY WITH A MEAL  (Patient taking differently: Take 1,000 mg by mouth 2 (two) times daily with a meal. TAKE 1 TABLET TWICE DAILY WITH A MEAL) 180 tablet 4  . metoprolol tartrate (LOPRESSOR) 100 MG tablet Take 1 tablet (100 mg total) by mouth 2 (two) times daily. 180 tablet 3  . nitroGLYCERIN (NITROSTAT) 0.4 MG SL tablet Place 1 tablet (0.4 mg total) under the tongue every 5 (five) minutes as needed for chest pain. X 3 doses 25 tablet 4  . polyethylene glycol (MIRALAX / GLYCOLAX) packet Take 17 g by mouth daily as needed for mild constipation.    . TRESIBA FLEXTOUCH 100 UNIT/ML FlexTouch Pen INJECT 32 UNITS INTO THE SKIN AT BEDTIME. (Patient taking differently: Inject 32 Units into the skin at bedtime.) 15 mL 2  No facility-administered medications prior to visit.     Allergies:   Contrast media [iodinated diagnostic agents]   Social History   Socioeconomic History  . Marital status: Married    Spouse name: Not on file  . Number of children: 5  . Years of education: Not on file  . Highest education level: Not on file  Occupational History  . Occupation: [employer]  Tobacco Use  . Smoking status: Current Some Day Smoker    Packs/day: 1.00    Years: 49.00    Pack years: 49.00    Types: Cigarettes    Last attempt to quit: 09/08/2015    Years since quitting: 4.7  . Smokeless tobacco: Never Used  . Tobacco comment: passive smoker as well  Vaping Use  . Vaping Use: Never used  Substance and Sexual Activity  . Alcohol use: No    Alcohol/week: 0.0 standard drinks  . Drug use: No  . Sexual activity: Not on file  Other Topics Concern  . Not on file  Social History Narrative  . Not on file   Social Determinants of Health   Financial Resource Strain: Low Risk   . Difficulty of Paying Living Expenses: Not hard at all  Food Insecurity: Not on file  Transportation Needs: Not on file  Physical Activity: Sufficiently Active  . Days of Exercise per Week: 3 days  . Minutes of Exercise per Session: 60  min  Stress: Not on file  Social Connections: Not on file     Family History:  The patient's family history includes Diabetes in his brother and mother; Heart attack in his brother, father, and mother; Hypertension in his father.   ROS:   Please see the history of present illness.    ROS All other systems reviewed and are negative.   PHYSICAL EXAM:   VS:  There were no vitals taken for this visit.   GENERAL:  Chronically ill appearing WM in NAD HEENT:  PERRL, EOMI, sclera are clear. Oropharynx is clear. NECK:  No jugular venous distention, carotid upstroke brisk and symmetric, no bruits, no thyromegaly or adenopathy LUNGS:  Coarse BS bilaterally. Decreased BS.  CHEST:  Unremarkable HEART:  IRRR,  PMI not displaced or sustained,S1 and S2 within normal limits, no S3, no S4: no clicks, no rubs, no murmurs ABD:  Soft, nontender. BS +, no masses or bruits. No hepatomegaly, no splenomegaly EXT:  2 + pulses throughout, no edema, no cyanosis no clubbing SKIN:  Warm and dry.  No rashes NEURO:  Alert and oriented x 3. Cranial nerves II through XII intact. PSYCH:  Cognitively intact    Wt Readings from Last 3 Encounters:  05/24/20 171 lb 11.2 oz (77.9 kg)  05/12/20 175 lb 11.3 oz (79.7 kg)  05/10/20 177 lb 12.8 oz (80.6 kg)      Studies/Labs Reviewed:   EKG:  EKG is not  ordered today.     Recent Labs: 05/10/2020: B Natriuretic Peptide 696.0 05/11/2020: ALT 27; Hemoglobin 12.9; Magnesium 1.8; Platelets 194 05/12/2020: BUN 22; Creatinine, Ser 1.25; Potassium 3.6; Sodium 139   Lipid Panel    Component Value Date/Time   CHOL 121 03/06/2019 1404   TRIG 88 03/06/2019 1404   HDL 43 03/06/2019 1404   CHOLHDL 2.8 03/06/2019 1404   CHOLHDL 2.9 09/25/2015 0221   VLDL 13 09/25/2015 0221   LDLCALC 61 03/06/2019 1404    Additional studies/ records that were reviewed today include:   Cath 09/26/2015  Prox LAD  to Mid LAD lesion, 30 %stenosed.  1st Diag lesion, 90 %stenosed.  Ost 1st  Mrg to 1st Mrg lesion, 80 %stenosed.  Ost RCA to Prox RCA lesion, 30 %stenosed.  Mid RCA-2 lesion, 40 %stenosed.  Mid RCA-1 lesion, 15 %stenosed.  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  There is mild left ventricular systolic dysfunction.  The left ventricular ejection fraction is 50-55% by visual estimate.  LV end diastolic pressure is moderately elevated.  A drug eluting stent was successfully placed.  2nd Mrg lesion, 90 %stenosed.  Post intervention, there is a 0% residual stenosis.  1. 3 vessel obstructive CAD.  - chronic severe stenosis in the first diagonal - New 90% stenosis in a large second OM - Stents in the proximal and mid RCA are patent - Aneurysm and stenosis in the PLOM branch of the RCA- the aneurysm is new compared to 2009 2. Mild LV dysfunction 3. Elevated LVEDP 4. Allergic response to contrast with hives and bronchospasm. 5. Successful stenting of the second OM with a Biofreedom stent.   Plan: continue IV diuresis. Patient received steroids, Benadryl, and IV pepcid for contrast reaction. Continue DAPT for one month then DC ASA. Resume Eliquis in am.     Echo 11/05/2017 LV EF: 60% - 65% Study Conclusions  - Left ventricle: The cavity size was normal. There was moderate concentric hypertrophy. Systolic function was normal. The estimated ejection fraction was in the range of 60% to 65%. Wall motion was normal; there were no regional wall motion abnormalities. The study was not technically sufficient to allow evaluation of LV diastolic dysfunction due to atrial fibrillation. - Aortic valve: Poorly visualized. Trileaflet; moderately thickened, moderately calcified leaflets. - Aorta: Aortic root dimension: 40 mm (ED). Ascending aortic diameter: 38 mm (S). - Aortic root: The aortic root was mildly dilated. - Ascending aorta: The ascending aorta was mildly dilated. - Left atrium: The  atrium was moderately dilated. - Right atrium: The atrium was moderately dilated. - Pulmonic valve: There was mild regurgitation. - Pulmonary arteries: Systolic pressure could not be accurately estimated.  Echo 05/11/20: IMPRESSIONS    1. Left ventricular ejection fraction, by estimation, is 50 to 55%. The  left ventricle has low normal function. The left ventricle has no regional  wall motion abnormalities. There is moderate left ventricular hypertrophy.  Left ventricular diastolic  function could not be evaluated.  2. Right ventricular systolic function is normal. The right ventricular  size is normal. There is normal pulmonary artery systolic pressure.  3. Left atrial size was mildly dilated.  4. The mitral valve is grossly normal. Trivial mitral valve  regurgitation.  5. The aortic valve is grossly normal. Aortic valve regurgitation is  trivial.  6. The inferior vena cava is dilated in size with >50% respiratory  variability, suggesting right atrial pressure of 8 mmHg.    ASSESSMENT:    No diagnosis found.   PLAN:  In order of problems listed above:  1. Chronic atrial fibrillation. Rate improved with discontinuation of diltiazem. Continue Eliquis. Continue current metoprolol dose.   2. CAD: s/p multiple interventions in the past- last in 2017. On Plavix and lopressor. On statin. Minimal angina.  3. Chronic diastolic CHF: Admitted in April with exacerbation due to severe HTN, sodium indiscretion, Afib with RVR. Volume status is good on current lasix dose. No edema and weight is stable.   4. Hyperlipidemia: On Lipitor 40 mg daily.  LDL 61  5. History  of AAA repair: [place] in December showed no change 3.8 cm.   6..  DM type 2 on metformin.  A1c- 7.6.   Follow up in 6 months with lab.    Medication Adjustments/Labs and Tests Ordered: Current medicines are reviewed at length with the patient today.  Concerns regarding medicines are outlined above.  Medication  changes, Labs and Tests ordered today are listed in the Patient Instructions below. There are no Patient Instructions on file for this visit.   Signed, Demarion Pondexter Martinique, MD  06/07/2020 3:56 PM    [facility] [address], [place], [place]  [address] Phone: [phone]; Fax: [phone]

## 2020-06-08 ENCOUNTER — Other Ambulatory Visit: Payer: Self-pay | Admitting: Cardiology

## 2020-06-08 DIAGNOSIS — I480 Paroxysmal atrial fibrillation: Secondary | ICD-10-CM

## 2020-06-10 NOTE — Telephone Encounter (Signed)
37m, 77.9kg, scr 1.25 05/12/20, lovw/jordan 01/05/20

## 2020-06-13 ENCOUNTER — Other Ambulatory Visit: Payer: Self-pay | Admitting: Internal Medicine

## 2020-06-13 ENCOUNTER — Ambulatory Visit: Payer: Medicare Other | Admitting: Cardiology

## 2020-06-13 NOTE — Telephone Encounter (Signed)
Last filled by Modena Jansky, MD Okay to fill?

## 2020-06-15 ENCOUNTER — Other Ambulatory Visit: Payer: Self-pay | Admitting: Emergency Medicine

## 2020-06-20 ENCOUNTER — Encounter: Payer: Self-pay | Admitting: Emergency Medicine

## 2020-06-20 ENCOUNTER — Other Ambulatory Visit: Payer: Self-pay

## 2020-06-20 ENCOUNTER — Ambulatory Visit: Payer: Medicare Other | Admitting: Emergency Medicine

## 2020-06-20 DIAGNOSIS — J42 Unspecified chronic bronchitis: Secondary | ICD-10-CM

## 2020-06-20 DIAGNOSIS — F17200 Nicotine dependence, unspecified, uncomplicated: Secondary | ICD-10-CM | POA: Diagnosis not present

## 2020-06-20 NOTE — Progress Notes (Signed)
Subjective:    Patient ID: Jason Fisher, male    DOB: [birth year], 80 y.o.   MRN: [id number]  HPI   ROV 07/13/19 --80 year old gentleman with a history of very severe COPD, remote nodular disease (resolved on CT scan).  I saw him about 6 weeks ago with progressive dyspnea, cough with clear sputum.  We decided to retry Bevespi to see if he would get benefit. He reports that he probably has benefited some - his breathing has improved some especially at night. Activity is about the same. He is wheezing less. Still has cough, clear mucous. Using xopenex - not every day, about the same amount.  I had planned for a walking oximetry last time but no data recorded >> he was not started on O2.  COVID vaccine up to date, flu shot up to date  Will still smoke occasionally >> 1 cig every few days.   ROV 06/20/20 --follow-up visit for Jason Fisher.  He is a 80 year old man with a history of very severe COPD.  Also with remote nodular disease that resolved on CT scans of the chest.  Past history significant for CAD, chronic A. fib, hypertension with chronic diastolic CHF, diabetes.  We were managing him on Bevespi and he was using Xopenex as needed.  He was admitted to the hospital in early April with what was treated as an acute exacerbation of congestive heart failure. This was apparently sudden onset, associated with hypertension. No wheeze, some cough that was non-productive.   He is improved. Has some daily cough with white / clear mucous. Uses xopenex 4-5x a week, often with climbing stairs or w a long walk. He did not desat last June on walk here. He feels that his breathing is about the same.  He is smoking about 1 cig a day.  COVID vaccine up to date.    Review of Systems As per HPI  Past Medical History:  Diagnosis Date  . 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 BENIGN PROSTATIC HYPERTROPHY, HX OF 02/24/2007  . CAD S/P PCI    a. 2007 Inf STEMI s/p Vision BMS  2 to RCA;  b. 2009 ISR Prox RCA  Stent-->with DES;  c. 09/2015 PCI: LM nl, LAD 30p/m, D1 90, LCX nl, OM1 small, 80, OM2 90 (2.5x14 Biofreedom stent), OM3 small, RCA 30p ISR, 82m ISR, 18m, 85d, EF 50-55%.  . 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 Chronic atrial fibrillation ([place]) 11/06/2009   a. On Eliquis (CHA2DS2VASc = 5).  Rate controlled w/ BB and CCB.  Marland Kitchen Chronic diastolic CHF (congestive heart failure) ([place])    a. 09/2015 Echo: EF 55-60%, mildly dil LA.  . CKD (chronic kidney disease), stage III ([place])    they mentioned this to the patient but they would work on it later after the aneurysm  . COLONIC POLYPS, HX OF 11/02/2006  . COPD (chronic obstructive pulmonary disease) ([place])   . DIABETES MELLITUS, TYPE II 10/29/2006  . Diverticulosis   . History of tobacco abuse    a. Quit 09/2015.  Marland Kitchen HYPERLIPIDEMIA 10/29/2006  . Hypertension   . Hypertensive heart disease with heart failure ([place])   . Pneumonia 09/2015   [name] 07/31/2016  . STEMI (ST elevation myocardial infarction) ([place]) 2007   /notes 07/31/2016         Objective:   Physical Exam Vitals:   06/20/20 1145  BP: (!) 144/98  Pulse: 71  SpO2: 96%  Weight: 174 lb (78.9 kg)  Height:  5\' 8"  (1.727 m)   Gen: Pleasant, well-nourished elderly man, in no distress,  normal affect  ENT: No lesions,  mouth clear,  oropharynx clear, no postnasal drip  Neck: No JVD, no stridor  Lungs: No use of accessory muscles, distant, clear without rales or rhonchi. No wheeze on forced exp  Cardiovascular: RRR, heart sounds normal, no murmur or gallops, no peripheral edema  Musculoskeletal: No deformities, no cyanosis or clubbing  Neuro: alert, non focal  Skin: Warm, no lesions or rashes       Assessment & Plan:  COPD (chronic obstructive pulmonary disease) (HCC) Appears to be well compensated.  Uses Xopenex about 4-5 times weekly.  His recent hospitalization do not appear to be an acute exacerbation of obstructive lung disease.  Plan to continue Bevespi as his maintenance  therapy.  COVID-19 vaccine up-to-date.  Discussed the timing of booster with him today.  Please continue your Bevespi 2 puffs twice a day. Keep your Xopenex available to use 2 puffs when needed for shortness of breath, chest tightness, wheezing. COVID-19 vaccine is up-to-date Follow with Dr. Lamonte Sakai in 12 months or sooner if you have any problems.  TOBACCO USER You would benefit from stopping your cigarettes altogether.  Try to work on this.  Baltazar Apo, MD, PhD 06/20/2020, 12:23 PM [facility] [phone] or if no answer [phone]

## 2020-06-20 NOTE — Patient Instructions (Addendum)
Please continue your Bevespi 2 puffs twice a day. Keep your Xopenex available to use 2 puffs when needed for shortness of breath, chest tightness, wheezing. COVID-19 vaccine is up-to-date You would benefit from stopping your cigarettes altogether.  Try to work on this. Follow with Dr. Lamonte Sakai in 12 months or sooner if you have any problems.

## 2020-06-20 NOTE — Assessment & Plan Note (Signed)
You would benefit from stopping your cigarettes altogether.  Try to work on this.

## 2020-06-20 NOTE — Assessment & Plan Note (Signed)
Appears to be well compensated.  Uses Xopenex about 4-5 times weekly.  His recent hospitalization do not appear to be an acute exacerbation of obstructive lung disease.  Plan to continue Bevespi as his maintenance therapy.  COVID-19 vaccine up-to-date.  Discussed the timing of booster with him today.  Please continue your Bevespi 2 puffs twice a day. Keep your Xopenex available to use 2 puffs when needed for shortness of breath, chest tightness, wheezing. COVID-19 vaccine is up-to-date Follow with Dr. Lamonte Sakai in 12 months or sooner if you have any problems.

## 2020-07-05 ENCOUNTER — Ambulatory Visit (INDEPENDENT_AMBULATORY_CARE_PROVIDER_SITE_OTHER): Payer: Medicare Other | Admitting: Internal Medicine

## 2020-07-05 ENCOUNTER — Encounter: Payer: Self-pay | Admitting: Internal Medicine

## 2020-07-05 ENCOUNTER — Other Ambulatory Visit: Payer: Self-pay

## 2020-07-05 VITALS — BP 140/90 | HR 94 | Temp 97.6°F | Wt 177.1 lb

## 2020-07-05 DIAGNOSIS — I10 Essential (primary) hypertension: Secondary | ICD-10-CM | POA: Diagnosis not present

## 2020-07-05 MED ORDER — HYDRALAZINE HCL 50 MG PO TABS: 50.0000 mg | ORAL_TABLET | Freq: Three times a day (TID) | ORAL | 0 refills | Status: DC

## 2020-07-05 NOTE — Progress Notes (Signed)
Established Patient Office Visit     This visit occurred during the SARS-CoV-2 public health emergency.  Safety protocols were in place, including screening questions prior to the visit, additional usage of staff PPE, and extensive cleaning of exam room while observing appropriate contact time as indicated for disinfecting solutions.    CC/Reason for Visit: Follow-up blood pressure  HPI: Jason Fisher is a 80 y.o. male who is coming in today for the above mentioned reasons. Past Medical History is significant for: Chronic diastolic heart failure, benign essential hypertension,AAA,type 2 diabetes, chronic atrial fibrillation, chronic kidney disease stage III.   At last visit his blood pressure was noted to be 140/100.  He was asked to do ambulatory blood pressure monitoring and return today.  He seems confused about the medications that he is on, forgets to bring in his ambulatory measurements frequently.  He feels well and has no complaints.   Past Medical/Surgical History: Past Medical History:  Diagnosis Date  . 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 BENIGN PROSTATIC HYPERTROPHY, HX OF 02/24/2007  . CAD S/P PCI    a. 2007 Inf STEMI s/p Vision BMS  2 to RCA;  b. 2009 ISR Prox RCA Stent-->with DES;  c. 09/2015 PCI: LM nl, LAD 30p/m, D1 90, LCX nl, OM1 small, 80, OM2 90 (2.5x14 Biofreedom stent), OM3 small, RCA 30p ISR, 24m ISR, 31m, 85d, EF 50-55%.  . 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 Chronic atrial fibrillation ([place]) 11/06/2009   a. On Eliquis (CHA2DS2VASc = 5).  Rate controlled w/ BB and CCB.  Marland Kitchen Chronic diastolic CHF (congestive heart failure) ([place])    a. 09/2015 Echo: EF 55-60%, mildly dil LA.  . CKD (chronic kidney disease), stage III ([place])    they mentioned this to the patient but they would work on it later after the aneurysm  . COLONIC POLYPS, HX OF 11/02/2006  . COPD (chronic obstructive pulmonary disease) ([place])   . DIABETES MELLITUS, TYPE II 10/29/2006  .  Diverticulosis   . History of tobacco abuse    a. Quit 09/2015.  Marland Kitchen HYPERLIPIDEMIA 10/29/2006  . Hypertension   . Hypertensive heart disease with heart failure ([place])   . Pneumonia 09/2015   [name] 07/31/2016  . STEMI (ST elevation myocardial infarction) [facility]) 2007   [name] 07/31/2016    Past Surgical History:  Procedure Laterality Date  . ABDOMINAL AORTIC ENDOVASCULAR STENT GRAFT N/A 07/31/2016   Procedure: ABDOMINAL AORTIC ENDOVASCULAR STENT GRAFT;  Surgeon: Serafina Mitchell, MD;  Location: [place];  Service: Vascular;  Laterality: N/A;  . CARDIAC CATHETERIZATION N/A 09/26/2015   Procedure: Left Heart Cath and Coronary Angiography;  Surgeon: Peter M Martinique, MD;  Location: [place] [facility];  Service: Cardiovascular;  Laterality: N/A;  . CARDIAC CATHETERIZATION N/A 09/26/2015   Procedure: Coronary Stent Intervention;  Surgeon: Peter M Martinique, MD;  Location: [place] [facility];  Service: Cardiovascular;  Laterality: N/A;  . CATARACT EXTRACTION Bilateral   . COLONOSCOPY    . CORONARY ANGIOPLASTY WITH STENT PLACEMENT  2007   Inferior STEMI 2007 - RCA PCI with 2 Vision BMS; Abnormal Myoview in 2009 - pRCA ins-stent restenosis & unstented disease - DES PCI Promus 3.0 x 20 mm (3.5 mm)  . ENDOVASCULAR REPAIR/STENT GRAFT  07/31/2016  . INCISION AND DRAINAGE PERIRECTAL ABSCESS N/A 11/06/2015   Procedure: IRRIGATION AND DEBRIDEMENT PERIRECTAL ABSCESS, POSSIBLE HEMORRHOIDECTOMY;  Surgeon: Coralie Keens, MD;  Location: [place];  Service: General;  Laterality: N/A;  .  ORIF SHOULDER FRACTURE Right    "fell out of back end of a truck"    Social History:  reports that he has been smoking cigarettes. He has a 49.00 pack-year smoking history. He has never used smokeless tobacco. He reports that he does not drink alcohol and does not use drugs.  Allergies: Allergies  Allergen Reactions  . Contrast Media [Iodinated Diagnostic Agents] Anaphylaxis    Family History:  Family History  Problem Relation Age  of Onset  . Heart attack Mother   . Diabetes Mother   . Heart attack Father   . Hypertension Father   . Heart attack Brother   . Diabetes Brother      Current Outpatient Medications:  .  ACCU-CHEK AVIVA PLUS test strip, CHECK BLOOD SUGAR ONCE DAILY. DX E11.51, Disp: 100 strip, Rfl: 2 .  atorvastatin (LIPITOR) 40 MG tablet, TAKE 1 TABLET BY MOUTH EVERY DAY (Patient taking differently: Take 40 mg by mouth daily.), Disp: 90 tablet, Rfl: 1 .  BEVESPI AEROSPHERE 9-4.8 MCG/ACT AERO, TAKE 2 PUFFS BY MOUTH TWICE A DAY, Disp: 10.7 each, Rfl: 0 .  clopidogrel (PLAVIX) 75 MG tablet, TAKE 1 TABLET BY MOUTH EVERY DAY (Patient taking differently: Take 75 mg by mouth daily.), Disp: 90 tablet, Rfl: 3 .  diltiazem (CARDIZEM CD) 120 MG 24 hr capsule, TAKE 1 CAPSULE BY MOUTH EVERY DAY, Disp: 30 capsule, Rfl: 0 .  ELIQUIS 5 MG TABS tablet, TAKE 1 TABLET BY MOUTH TWICE A DAY, Disp: 180 tablet, Rfl: 1 .  furosemide (LASIX) 80 MG tablet, Take 1 tablet (80 mg total) by mouth daily., Disp: 180 tablet, Rfl: 1 .  levalbuterol (XOPENEX HFA) 45 MCG/ACT inhaler, Inhale 1-2 puffs into the lungs every 6 (six) hours as needed for wheezing., Disp: 1 Inhaler, Rfl: 3 .  metFORMIN (GLUCOPHAGE) 1000 MG tablet, TAKE 1 TABLET TWICE DAILY WITH A MEAL (Patient taking differently: Take 1,000 mg by mouth 2 (two) times daily with a meal. TAKE 1 TABLET TWICE DAILY WITH A MEAL), Disp: 180 tablet, Rfl: 4 .  metoprolol tartrate (LOPRESSOR) 100 MG tablet, Take 1 tablet (100 mg total) by mouth 2 (two) times daily., Disp: 180 tablet, Rfl: 3 .  nitroGLYCERIN (NITROSTAT) 0.4 MG SL tablet, Place 1 tablet (0.4 mg total) under the tongue every 5 (five) minutes as needed for chest pain. X 3 doses, Disp: 25 tablet, Rfl: 4 .  polyethylene glycol (MIRALAX / GLYCOLAX) packet, Take 17 g by mouth daily as needed for mild constipation., Disp: , Rfl:  .  TRESIBA FLEXTOUCH 100 UNIT/ML FlexTouch Pen, INJECT 32 UNITS INTO THE SKIN AT BEDTIME. (Patient taking  differently: Inject 32 Units into the skin at bedtime.), Disp: 15 mL, Rfl: 2 .  hydrALAZINE (APRESOLINE) 50 MG tablet, Take 1 tablet (50 mg total) by mouth 3 (three) times daily., Disp: 270 tablet, Rfl: 0  Review of Systems:  Constitutional: Denies fever, chills, diaphoresis, appetite change and fatigue.  HEENT: Denies photophobia, eye pain, redness, hearing loss, ear pain, congestion, sore throat, rhinorrhea, sneezing, mouth sores, trouble swallowing, neck pain, neck stiffness and tinnitus.   Respiratory: Denies SOB, DOE, cough, chest tightness,  and wheezing.   Cardiovascular: Denies chest pain, palpitations and leg swelling.  Gastrointestinal: Denies nausea, vomiting, abdominal pain, diarrhea, constipation, blood in stool and abdominal distention.  Genitourinary: Denies dysuria, urgency, frequency, hematuria, flank pain and difficulty urinating.  Endocrine: Denies: hot or cold intolerance, sweats, changes in hair or nails, polyuria, polydipsia. Musculoskeletal: Denies myalgias, back pain,  joint swelling, arthralgias and gait problem.  Skin: Denies pallor, rash and wound.  Neurological: Denies dizziness, seizures, syncope, weakness, light-headedness, numbness and headaches.  Hematological: Denies adenopathy. Easy bruising, personal or family bleeding history  Psychiatric/Behavioral: Denies suicidal ideation, mood changes, confusion, nervousness, sleep disturbance and agitation    Physical Exam: Vitals:   07/05/20 1053  BP: 140/90  Pulse: 94  Temp: 97.6 F (36.4 C)  TempSrc: Oral  SpO2: 97%  Weight: 177 lb 1.6 oz (80.3 kg)    Body mass index is 26.93 kg/m.   Constitutional: NAD, calm, comfortable Eyes: PERRL, lids and conjunctivae normal, wears corrective lenses ENMT: Mucous membranes are moist. Respiratory: clear to auscultation bilaterally, no wheezing, no crackles. Normal respiratory effort. No accessory muscle use.  Cardiovascular: Regular rate and rhythm, no murmurs /  rubs / gallops. No extremity edema. Neurologic: Grossly intact and nonfocal   Impression and Plan:  Essential hypertension  -Remains uncontrolled with an in office blood pressure of 140/90. -He is on diltiazem 120 mg daily, Lasix 80 mg daily, metoprolol 100 mg twice daily and hydralazine 25 mg 3 times a day. -I question medication compliance as he cannot tell me what medications he is on. -I will go ahead and arrange for a home health nurse for medication management. -I will increase hydralazine to 50 mg 3 times a day. -Detailed instructions with these changes have been provided to patient. -Follow-up in 6 weeks.   Patient Instructions  -Nice seeing you today!!  -Please continue taking the following blood pressure medications: Diltiazem 120 mg daily, furosemide 80 mg daily, metoprolol 100 mg twice daily.  -We are increasing your hydralazine from 25 mg 3 times a day to 50 mg 3 times a day.  New prescription will be sent.  -I needed you to take blood pressure measurements at home and bring them with you to your next visit in 6 weeks.     Lelon Frohlich, MD [facility]

## 2020-07-05 NOTE — Patient Instructions (Signed)
-  Nice seeing you today!!  -Please continue taking the following blood pressure medications: Diltiazem 120 mg daily, furosemide 80 mg daily, metoprolol 100 mg twice daily.  -We are increasing your hydralazine from 25 mg 3 times a day to 50 mg 3 times a day.  New prescription will be sent.  -I needed you to take blood pressure measurements at home and bring them with you to your next visit in 6 weeks.

## 2020-07-07 ENCOUNTER — Other Ambulatory Visit: Payer: Self-pay | Admitting: Internal Medicine

## 2020-07-29 ENCOUNTER — Ambulatory Visit: Payer: Medicare Other | Admitting: Physician Assistant

## 2020-07-29 ENCOUNTER — Ambulatory Visit (HOSPITAL_COMMUNITY)
Admission: RE | Admit: 2020-07-29 | Discharge: 2020-07-29 | Disposition: A | Discharge status: Home / Self Care | Payer: Medicare Other | Source: Ambulatory Visit | Attending: Surgery | Admitting: Surgery

## 2020-07-29 ENCOUNTER — Other Ambulatory Visit: Payer: Self-pay

## 2020-07-29 VITALS — BP 140/90 | HR 84 | Temp 97.7°F | Resp 20 | Ht 68.0 in | Wt 173.7 lb

## 2020-07-29 DIAGNOSIS — I714 Abdominal aortic aneurysm, without rupture, unspecified: Secondary | ICD-10-CM

## 2020-07-29 NOTE — Progress Notes (Signed)
Office Note     CC:  follow up Requesting Provider:  [name], Estel*  HPI: Jason Fisher is a 80 y.o. ([birth year]) male who presents for surveillance follow up after EVAR. He had his EVAR on June 29th, 2018. 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 He has been doing well since his procedure. He has history of CT postoperatively that had showed Type II endoleak, but this was resolved on subsequent imaging follow up. At time of his last visit in December of 2021 the Maximal size of his aneurysm sac was 4.37 cm in transverse diameter. This had increased compared to prior duplex of 3.8 cm. He was without any associated symptoms but due to change recommendation was made for a 6 month follow up  He presents today for his 6 month follow up . He reports mild periodic abdominal pain. Says usually when he needs to have BM or indigestion related. He takes Tums and this usually resolves it. He otherwise denies any back pain. He does not describe any lower extremity claudication, or rest pain. He does say he gets tired after going up stairs. He does continue to smoke occasionally.   The pt is on a statin for cholesterol management.  The pt is not on a daily aspirin.   Other AC:  Plavix, Eliquis The pt is on CCB, hydralazine, BB for hypertension.   The pt is diabetic.   Tobacco hx:  current, some day smoker  Past Medical History:  Diagnosis Date   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 2007 Inf STEMI s/p Vision BMS  2 to RCA;  b. 2009 ISR Prox RCA Stent-->with DES;  c. 09/2015 PCI: LM nl, LAD 30p/m, D1 90, LCX nl, OM1 small, 80, OM2 90 (2.5x14 Biofreedom stent), OM3 small, RCA 30p ISR, 41m ISR, 21m, 85d, EF 50-55%.   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 On Eliquis (CHA2DS2VASc = 5).  Rate controlled w/ BB and CCB.   Chronic diastolic CHF (congestive heart failure) ([place])    a.  09/2015 Echo: EF 55-60%, mildly dil LA.   CKD (chronic kidney disease), stage III ([place])    they mentioned this to the patient but they would work on it later after the aneurysm   COLONIC POLYPS, HX OF 11/02/2006   COPD (chronic obstructive pulmonary disease) ([place])    DIABETES MELLITUS, TYPE II 10/29/2006   Diverticulosis    History of tobacco abuse    a. Quit 09/2015.   HYPERLIPIDEMIA 10/29/2006   Hypertension    Hypertensive heart disease with heart failure ([place])    Pneumonia 09/2015   [name] 07/31/2016   STEMI (ST elevation myocardial infarction) [facility]) 2007   [name] 07/31/2016    Past Surgical History:  Procedure Laterality Date   ABDOMINAL AORTIC ENDOVASCULAR STENT GRAFT N/A 07/31/2016   Procedure: ABDOMINAL AORTIC ENDOVASCULAR STENT GRAFT;  Surgeon: Serafina Mitchell, MD;  Location: [facility];  Service: Vascular;  Laterality: N/A;   CARDIAC CATHETERIZATION N/A 09/26/2015   Procedure: Left Heart Cath and Coronary Angiography;  Surgeon: Peter M Martinique, MD;  Location: [facility] [facility];  Service: Cardiovascular;  Laterality: N/A;   CARDIAC CATHETERIZATION N/A 09/26/2015   Procedure: Coronary Stent Intervention;  Surgeon: Peter M Martinique, MD;  Location: [facility] [facility];  Service: Cardiovascular;  Laterality: N/A;   CATARACT EXTRACTION Bilateral  COLONOSCOPY     CORONARY ANGIOPLASTY WITH STENT PLACEMENT  2007   Inferior STEMI 2007 - RCA PCI with 2 Vision BMS; Abnormal Myoview in 2009 - pRCA ins-stent restenosis & unstented disease - DES PCI Promus 3.0 x 20 mm (3.5 mm)   ENDOVASCULAR REPAIR/STENT GRAFT  07/31/2016   INCISION AND DRAINAGE PERIRECTAL ABSCESS N/A 11/06/2015   Procedure: IRRIGATION AND DEBRIDEMENT PERIRECTAL ABSCESS, POSSIBLE HEMORRHOIDECTOMY;  Surgeon: Coralie Keens, MD;  Location: [facility];  Service: General;  Laterality: N/A;   ORIF SHOULDER FRACTURE Right    "fell out of back end of a truck"    Social History   Socioeconomic History   Marital status: Married    Spouse  name: Not on file   Number of children: 5   Years of education: Not on file   Highest education level: Not on file  Occupational History   Occupation: [employer]  Tobacco Use   Smoking status: Some Days    Packs/day: 1.00    Years: 49.00    Pack years: 49.00    Types: Cigarettes    Last attempt to quit: 09/08/2015    Years since quitting: 4.8   Smokeless tobacco: Never   Tobacco comments:    passive smoker as well  Vaping Use   Vaping Use: Never used  Substance and Sexual Activity   Alcohol use: No    Alcohol/week: 0.0 standard drinks   Drug use: No   Sexual activity: Not on file  Other Topics Concern   Not on file  Social History Narrative   Not on file   Social Determinants of Health   Financial Resource Strain: Not on file  Food Insecurity: Not on file  Transportation Needs: Not on file  Physical Activity: Not on file  Stress: Not on file  Social Connections: Not on file  Intimate Partner Violence: Not on file    Family History  Problem Relation Age of Onset   Heart attack Mother    Diabetes Mother    Heart attack Father    Hypertension Father    Heart attack Brother    Diabetes Brother     Current Outpatient Medications  Medication Sig Dispense Refill   ACCU-CHEK AVIVA PLUS test strip CHECK BLOOD SUGAR ONCE DAILY. DX E11.51 100 strip 2   atorvastatin (LIPITOR) 40 MG tablet TAKE 1 TABLET BY MOUTH EVERY DAY (Patient taking differently: Take 40 mg by mouth daily.) 90 tablet 1   BEVESPI AEROSPHERE 9-4.8 MCG/ACT AERO TAKE 2 PUFFS BY MOUTH TWICE A DAY 10.7 each 0   clopidogrel (PLAVIX) 75 MG tablet TAKE 1 TABLET BY MOUTH EVERY DAY (Patient taking differently: Take 75 mg by mouth daily.) 90 tablet 3   diltiazem (CARDIZEM CD) 120 MG 24 hr capsule TAKE 1 CAPSULE BY MOUTH EVERY DAY 30 capsule 1   ELIQUIS 5 MG TABS tablet TAKE 1 TABLET BY MOUTH TWICE A DAY 180 tablet 1   furosemide (LASIX) 80 MG tablet Take 1 tablet (80 mg total) by mouth daily. 180 tablet 1    hydrALAZINE (APRESOLINE) 50 MG tablet Take 1 tablet (50 mg total) by mouth 3 (three) times daily. 270 tablet 0   levalbuterol (XOPENEX HFA) 45 MCG/ACT inhaler Inhale 1-2 puffs into the lungs every 6 (six) hours as needed for wheezing. 1 Inhaler 3   metFORMIN (GLUCOPHAGE) 1000 MG tablet TAKE 1 TABLET TWICE DAILY WITH A MEAL (Patient taking differently: Take 1,000 mg by mouth 2 (two) times daily with a meal.  TAKE 1 TABLET TWICE DAILY WITH A MEAL) 180 tablet 4   metoprolol tartrate (LOPRESSOR) 100 MG tablet Take 1 tablet (100 mg total) by mouth 2 (two) times daily. 180 tablet 3   nitroGLYCERIN (NITROSTAT) 0.4 MG SL tablet Place 1 tablet (0.4 mg total) under the tongue every 5 (five) minutes as needed for chest pain. X 3 doses 25 tablet 4   polyethylene glycol (MIRALAX / GLYCOLAX) packet Take 17 g by mouth daily as needed for mild constipation.     TRESIBA FLEXTOUCH 100 UNIT/ML FlexTouch Pen INJECT 32 UNITS INTO THE SKIN AT BEDTIME. (Patient taking differently: Inject 32 Units into the skin at bedtime.) 15 mL 2   No current facility-administered medications for this visit.    Allergies  Allergen Reactions   Contrast Media [Iodinated Diagnostic Agents] Anaphylaxis     REVIEW OF SYSTEMS:  [X]  denotes positive finding, [ ]  denotes negative finding Cardiac  Comments:  Chest pain or chest pressure:    Shortness of breath upon exertion:    Short of breath when lying flat:    Irregular heart rhythm:        Vascular    Pain in calf, thigh, or hip brought on by ambulation:    Pain in feet at night that wakes you up from your sleep:     Blood clot in your veins:    Leg swelling:         Pulmonary    Oxygen at home:    Productive cough:     Wheezing:         Neurologic    Sudden weakness in arms or legs:     Sudden numbness in arms or legs:     Sudden onset of difficulty speaking or slurred speech:    Temporary loss of vision in one eye:     Problems with dizziness:          Gastrointestinal    Blood in stool:     Vomited blood:         Genitourinary    Burning when urinating:     Blood in urine:        Psychiatric    Major depression:         Hematologic    Bleeding problems:    Problems with blood clotting too easily:        Skin    Rashes or ulcers:        Constitutional    Fever or chills:      PHYSICAL EXAMINATION:  Vitals:   07/29/20 0913  BP: 140/90  Pulse: 84  Resp: 20  Temp: 97.7 F (36.5 C)  TempSrc: Temporal  SpO2: 97%  Weight: 173 lb 11.2 oz (78.8 kg)  Height: 5\' 8"  (1.727 m)    General:  WDWN in NAD; vital signs documented above Gait: Normal HENT: WNL, normocephalic Pulmonary: normal non-labored breathing , without wheezing Cardiac: regular HR, without  Murmurs without carotid bruit Abdomen: soft, NT, no masses. Normal BS. No palpable abdominal aortic pulse Vascular Exam/Pulses:  Right Left  Radial 2+ (normal) 2+ (normal)  Femoral 2+ (normal) 2+ (normal)  Popliteal Not palpable  Not palpable   DP 2+ (normal) 2+ (normal)  PT 1+ (weak) 1+ (weak)   Extremities: without ischemic changes, without Gangrene , without cellulitis; without open wounds;  Musculoskeletal: no muscle wasting or atrophy  Neurologic: A&O X 3;  No focal weakness or paresthesias are detected Psychiatric:  The pt has Normal  affect.   Non-Invasive Vascular Imaging:   Endovascular Aortic Repair (EVAR):  +----------+----------------+-------------------+-------------------+            Diameter AP (cm)Diameter Trans (cm)Velocities (cm/sec)  +----------+----------------+-------------------+-------------------+  Aorta     3.81            3.98               53                   +----------+----------------+-------------------+-------------------+  Right Limb1.39            1.46               57                   +----------+----------------+-------------------+-------------------+  Left Limb 1.23            1.32               70                    +----------+----------------+-------------------+-------------------+   Previous duplex showed max transverse diameter of 4.37 cm   ASSESSMENT/PLAN:: 80 y.o. male here for follow up for EVAR. This was performed in June of 2018.  Ultrasound today shows stable sac measuring 3.98 in the transverse dimension. This is decreased as compared to 4.37 cm 6 months ago.    He is currently without symptoms referable to his aneurysm - Advised him to proceed to the emergency department for sudden, severe abdominal or back pain - he will follow up in 1 year with repeated EVAR duplex   Karoline Caldwell, PA-C [facility] [phone]  Clinic MD:   Trula Slade

## 2020-08-02 DIAGNOSIS — I482 Chronic atrial fibrillation, unspecified: Secondary | ICD-10-CM | POA: Diagnosis not present

## 2020-08-02 DIAGNOSIS — Z9889 Other specified postprocedural states: Secondary | ICD-10-CM | POA: Diagnosis not present

## 2020-08-02 DIAGNOSIS — Z8679 Personal history of other diseases of the circulatory system: Secondary | ICD-10-CM | POA: Diagnosis not present

## 2020-08-02 DIAGNOSIS — I2510 Atherosclerotic heart disease of native coronary artery without angina pectoris: Secondary | ICD-10-CM | POA: Diagnosis not present

## 2020-08-02 DIAGNOSIS — I10 Essential (primary) hypertension: Secondary | ICD-10-CM | POA: Diagnosis not present

## 2020-08-02 LAB — BASIC METABOLIC PANEL
BUN/Creatinine Ratio: 15 (ref 10–24)
BUN: 24 mg/dL (ref 8–27)
CO2: 27 mmol/L (ref 20–29)
Calcium: 10 mg/dL (ref 8.6–10.2)
Chloride: 101 mmol/L (ref 96–106)
Creatinine, Ser: 1.61 mg/dL — ABNORMAL HIGH (ref 0.76–1.27)
Glucose: 139 mg/dL — ABNORMAL HIGH (ref 65–99)
Potassium: 4 mmol/L (ref 3.5–5.2)
Sodium: 145 mmol/L — ABNORMAL HIGH (ref 134–144)
eGFR: 43 mL/min/{1.73_m2} — ABNORMAL LOW (ref >59)

## 2020-08-02 LAB — HEMOGLOBIN A1C
Est. average glucose Bld gHb Est-mCnc: 157 mg/dL
Hgb A1c MFr Bld: 7.1 % — ABNORMAL HIGH (ref 4.8–5.6)

## 2020-08-02 LAB — LIPID PANEL
Chol/HDL Ratio: 3.6 ratio (ref 0.0–5.0)
Cholesterol, Total: 141 mg/dL (ref 100–199)
HDL: 39 mg/dL — ABNORMAL LOW (ref >39)
LDL Chol Calc (NIH): 82 mg/dL (ref 0–99)
Triglycerides: 110 mg/dL (ref 0–149)
VLDL Cholesterol Cal: 20 mg/dL (ref 5–40)

## 2020-08-02 LAB — HEPATIC FUNCTION PANEL
ALT: 13 IU/L (ref 0–44)
AST: 17 IU/L (ref 0–40)
Albumin: 4.5 g/dL (ref 3.7–4.7)
Alkaline Phosphatase: 91 IU/L (ref 44–121)
Bilirubin Total: 0.5 mg/dL (ref 0.0–1.2)
Bilirubin, Direct: 0.19 mg/dL (ref 0.00–0.40)
Total Protein: 7.6 g/dL (ref 6.0–8.5)

## 2020-08-05 ENCOUNTER — Other Ambulatory Visit: Payer: Self-pay | Admitting: Internal Medicine

## 2020-08-06 NOTE — Progress Notes (Deleted)
d   Cardiology Office Note    Date:  08/06/2020   ID:  Jason Fisher, DOB 08-22-1940, MRN [id number]  PCP:  [name], [name], MD  Cardiologist:  Dr. Martinique   No chief complaint on file.   History of Present Illness:  Jason Fisher is a 80 y.o. male seen for evaluation of SOB and fatigue. He has a PMH of CAD (s/p BMS to RCA 2007, ISR with DES to RCA in 2009, DES to OM2 in 09/2015), chronic atrial fibrillation on Eliquis, chronic diastolic heart failure, HTN, HLD, AAA, COPD, and tobacco use.  He was previously admitted in March 2018 for atrial fibrillation with RVR.  Troponin was borderline elevated.  He was initially treated with IV Cardizem and later switched to p.o. Cardizem prior to discharge.  He also underwent AAA endovascular stent grafting by Dr. Trula Slade in June 2018.  He was seen in October 2019 by [name] PA-C for increased edema.  Lasix was increased to 80 mg twice daily for 3 days before decreasing back down to 40 mg twice daily thereafter.  Echocardiogram on 11/05/2017 this showed EF 60 to 65%, mildly increased aortic root measuring 40 mm, moderately dilated left and the right atrium.   When seen in September he was not feeling well. He has a lot of congestion in his chest with clear phlegm. Had been using Dayquil. He was more SOB. No increase in edema or weight. He  felt very tired and is dizzy. Had one episode of chest pain relieved with sl Ntg. He was found to be markedly bradycardic with HR 42. Diltiazem was stopped and metoprolol dose reduced. On follow up one week later HR was 119 in Afib. Metoprolol was increased back to 100 mg bid. CXR showed mild pleural effusions suggestive of mild CHF. Emphysema.   He was admitted 4/8-4/10/22 with acute CHF, AFib with RVR and severe HTN. High sodium intake was noted. Was managed with IV diuresis. Diltiazem was started for better rate control. DC on 120 mg daily. Also on metoprolol 100 mg bid and hydralazine 25 mg tid. Echo showed EF  50-55%. Normal valves.   He was seen by [facility] on June 27. [place] for EVAR was improved.   On follow up today he is feeling better. Energy is improved. Cough resolved. Breathing is better. No dizziness now. No chest pain.    Past Medical History:  Diagnosis Date   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 2007 Inf STEMI s/p Vision BMS  2 to RCA;  b. 2009 ISR Prox RCA Stent-->with DES;  c. 09/2015 PCI: LM nl, LAD 30p/m, D1 90, LCX nl, OM1 small, 80, OM2 90 (2.5x14 Biofreedom stent), OM3 small, RCA 30p ISR, 50m ISR, 51m, 85d, EF 50-55%.   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 On Eliquis (CHA2DS2VASc = 5).  Rate controlled w/ BB and CCB.   Chronic diastolic CHF (congestive heart failure) ([place])    a. 09/2015 Echo: EF 55-60%, mildly dil LA.   CKD (chronic kidney disease), stage III ([place])    they mentioned this to the patient but they would work on it later after the aneurysm   COLONIC POLYPS, HX OF 11/02/2006   COPD (chronic obstructive pulmonary disease) ([place])    DIABETES MELLITUS, TYPE II 10/29/2006   Diverticulosis    History of tobacco abuse  a. Quit 09/2015.   HYPERLIPIDEMIA 10/29/2006   Hypertension    Hypertensive heart disease with heart failure ([place])    Pneumonia 09/2015   [name] 07/31/2016   STEMI (ST elevation myocardial infarction) [facility]) 2007   [name] 07/31/2016    Past Surgical History:  Procedure Laterality Date   ABDOMINAL AORTIC ENDOVASCULAR STENT GRAFT N/A 07/31/2016   Procedure: ABDOMINAL AORTIC ENDOVASCULAR STENT GRAFT;  Surgeon: Serafina Mitchell, MD;  Location: [facility];  Service: Vascular;  Laterality: N/A;   CARDIAC CATHETERIZATION N/A 09/26/2015   Procedure: Left Heart Cath and Coronary Angiography;  Surgeon: Addison Whidbee M Martinique, MD;  Location: [facility];  Service: Cardiovascular;  Laterality: N/A;   CARDIAC CATHETERIZATION N/A 09/26/2015   Procedure: Coronary  Stent Intervention;  Surgeon: Princella Jaskiewicz M Martinique, MD;  Location: [facility];  Service: Cardiovascular;  Laterality: N/A;   CATARACT EXTRACTION Bilateral    COLONOSCOPY     CORONARY ANGIOPLASTY WITH STENT PLACEMENT  2007   Inferior STEMI 2007 - RCA PCI with 2 Vision BMS; Abnormal Myoview in 2009 - pRCA ins-stent restenosis & unstented disease - DES PCI Promus 3.0 x 20 mm (3.5 mm)   ENDOVASCULAR REPAIR/STENT GRAFT  07/31/2016   INCISION AND DRAINAGE PERIRECTAL ABSCESS N/A 11/06/2015   Procedure: IRRIGATION AND DEBRIDEMENT PERIRECTAL ABSCESS, POSSIBLE HEMORRHOIDECTOMY;  Surgeon: Coralie Keens, MD;  Location: [facility];  Service: General;  Laterality: N/A;   ORIF SHOULDER FRACTURE Right    "fell out of back end of a truck"    Current Medications: Outpatient Medications Prior to Visit  Medication Sig Dispense Refill   ACCU-CHEK AVIVA PLUS test strip CHECK BLOOD SUGAR ONCE DAILY. DX E11.51 100 strip 2   atorvastatin (LIPITOR) 40 MG tablet TAKE 1 TABLET BY MOUTH EVERY DAY (Patient taking differently: Take 40 mg by mouth daily.) 90 tablet 1   BEVESPI AEROSPHERE 9-4.8 MCG/ACT AERO TAKE 2 PUFFS BY MOUTH TWICE A DAY 10.7 each 0   clopidogrel (PLAVIX) 75 MG tablet TAKE 1 TABLET BY MOUTH EVERY DAY (Patient taking differently: Take 75 mg by mouth daily.) 90 tablet 3   diltiazem (CARDIZEM CD) 120 MG 24 hr capsule TAKE 1 CAPSULE BY MOUTH EVERY DAY 30 capsule 1   ELIQUIS 5 MG TABS tablet TAKE 1 TABLET BY MOUTH TWICE A DAY 180 tablet 1   furosemide (LASIX) 80 MG tablet Take 1 tablet (80 mg total) by mouth daily. 180 tablet 1   hydrALAZINE (APRESOLINE) 50 MG tablet Take 1 tablet (50 mg total) by mouth 3 (three) times daily. 270 tablet 0   levalbuterol (XOPENEX HFA) 45 MCG/ACT inhaler Inhale 1-2 puffs into the lungs every 6 (six) hours as needed for wheezing. 1 Inhaler 3   metFORMIN (GLUCOPHAGE) 1000 MG tablet TAKE 1 TABLET TWICE DAILY WITH A MEAL (Patient taking differently: Take 1,000 mg by mouth 2 (two)  times daily with a meal. TAKE 1 TABLET TWICE DAILY WITH A MEAL) 180 tablet 4   metoprolol tartrate (LOPRESSOR) 100 MG tablet Take 1 tablet (100 mg total) by mouth 2 (two) times daily. 180 tablet 3   nitroGLYCERIN (NITROSTAT) 0.4 MG SL tablet Place 1 tablet (0.4 mg total) under the tongue every 5 (five) minutes as needed for chest pain. X 3 doses 25 tablet 4   polyethylene glycol (MIRALAX / GLYCOLAX) packet Take 17 g by mouth daily as needed for mild constipation.     TRESIBA FLEXTOUCH 100 UNIT/ML FlexTouch Pen INJECT 32 UNITS INTO THE SKIN AT BEDTIME. (  Patient taking differently: Inject 32 Units into the skin at bedtime.) 15 mL 2   No facility-administered medications prior to visit.     Allergies:   Contrast media [iodinated diagnostic agents]   Social History   Socioeconomic History   Marital status: Married    Spouse name: Not on file   Number of children: 5   Years of education: Not on file   Highest education level: Not on file  Occupational History   Occupation: [employer]  Tobacco Use   Smoking status: Some Days    Packs/day: 1.00    Years: 49.00    Pack years: 49.00    Types: Cigarettes    Last attempt to quit: 09/08/2015    Years since quitting: 4.9   Smokeless tobacco: Never   Tobacco comments:    passive smoker as well  Vaping Use   Vaping Use: Never used  Substance and Sexual Activity   Alcohol use: No    Alcohol/week: 0.0 standard drinks   Drug use: No   Sexual activity: Not on file  Other Topics Concern   Not on file  Social History Narrative   Not on file   Social Determinants of Health   Financial Resource Strain: Not on file  Food Insecurity: Not on file  Transportation Needs: Not on file  Physical Activity: Not on file  Stress: Not on file  Social Connections: Not on file     Family History:  The patient's family history includes Diabetes in his brother and mother; Heart attack in his brother, father, and mother; Hypertension in his father.    ROS:   Please see the history of present illness.    ROS All other systems reviewed and are negative.   PHYSICAL EXAM:   VS:  There were no vitals taken for this visit.   GENERAL:  Chronically ill appearing WM in NAD HEENT:  PERRL, EOMI, sclera are clear. Oropharynx is clear. NECK:  No jugular venous distention, carotid upstroke brisk and symmetric, no bruits, no thyromegaly or adenopathy LUNGS:  Coarse BS bilaterally. Decreased BS.  CHEST:  Unremarkable HEART:  IRRR,  PMI not displaced or sustained,S1 and S2 within normal limits, no S3, no S4: no clicks, no rubs, no murmurs ABD:  Soft, nontender. BS +, no masses or bruits. No hepatomegaly, no splenomegaly EXT:  2 + pulses throughout, no edema, no cyanosis no clubbing SKIN:  Warm and dry.  No rashes NEURO:  Alert and oriented x 3. Cranial nerves II through XII intact. PSYCH:  Cognitively intact    Wt Readings from Last 3 Encounters:  07/29/20 173 lb 11.2 oz (78.8 kg)  07/05/20 177 lb 1.6 oz (80.3 kg)  06/20/20 174 lb (78.9 kg)      Studies/Labs Reviewed:   EKG:  EKG is not  ordered today.     Recent Labs: 05/10/2020: B Natriuretic Peptide 696.0 05/11/2020: Hemoglobin 12.9; Magnesium 1.8; Platelets 194 08/02/2020: ALT 13; BUN 24; Creatinine, Ser 1.61; Potassium 4.0; Sodium 145   Lipid Panel    Component Value Date/Time   CHOL 141 08/02/2020 1033   TRIG 110 08/02/2020 1033   HDL 39 (L) 08/02/2020 1033   CHOLHDL 3.6 08/02/2020 1033   CHOLHDL 2.9 09/25/2015 0221   VLDL 13 09/25/2015 0221   LDLCALC 82 08/02/2020 1033    Additional studies/ records that were reviewed today include:   Cath 09/26/2015 Prox LAD to Mid LAD lesion, 30 %stenosed. 1st Diag lesion, 90 %stenosed. Ost 1st Mrg  to 1st Mrg lesion, 80 %stenosed. Ost RCA to Prox RCA lesion, 30 %stenosed. Mid RCA-2 lesion, 40 %stenosed. Mid RCA-1 lesion, 15 %stenosed. 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 There is mild left  ventricular systolic dysfunction. The left ventricular ejection fraction is 50-55% by visual estimate. LV end diastolic pressure is moderately elevated. A drug eluting stent was successfully placed. 2nd Mrg lesion, 90 %stenosed. Post intervention, there is a 0% residual stenosis.   1. 3 vessel obstructive CAD.     - chronic severe stenosis in the first diagonal    - New 90% stenosis in a large second OM    - Stents in the proximal and mid RCA are patent    - Aneurysm and stenosis in the PLOM branch of the RCA- the aneurysm is new compared to 2009 2. Mild LV dysfunction 3. Elevated LVEDP 4. Allergic response to contrast with hives and bronchospasm. 5. Successful stenting of the second OM with a Biofreedom stent.    Plan: continue IV diuresis. Patient received steroids, Benadryl, and IV pepcid for contrast reaction. Continue DAPT for one month then DC ASA. Resume Eliquis in am.        Echo 11/05/2017 LV EF: 60% -   65% Study Conclusions   - Left ventricle: The cavity size was normal. There was moderate   concentric hypertrophy. Systolic function was normal. The   estimated ejection fraction was in the range of 60% to 65%. Wall   motion was normal; there were no regional wall motion   abnormalities. The study was not technically sufficient to allow   evaluation of LV diastolic dysfunction due to atrial   fibrillation. - Aortic valve: Poorly visualized. Trileaflet; moderately   thickened, moderately calcified leaflets. - Aorta: Aortic root dimension: 40 mm (ED). Ascending aortic   diameter: 38 mm (S). - Aortic root: The aortic root was mildly dilated. - Ascending aorta: The ascending aorta was mildly dilated. - Left atrium: The atrium was moderately dilated. - Right atrium: The atrium was moderately dilated. - Pulmonic valve: There was mild regurgitation. - Pulmonary arteries: Systolic pressure could not be accurately   estimated.   Echo 05/11/20:  IMPRESSIONS     1. Left  ventricular ejection fraction, by estimation, is 50 to 55%. The  left ventricle has low normal function. The left ventricle has no regional  wall motion abnormalities. There is moderate left ventricular hypertrophy.  Left ventricular diastolic  function could not be evaluated.   2. Right ventricular systolic function is normal. The right ventricular  size is normal. There is normal pulmonary artery systolic pressure.   3. Left atrial size was mildly dilated.   4. The mitral valve is grossly normal. Trivial mitral valve  regurgitation.   5. The aortic valve is grossly normal. Aortic valve regurgitation is  trivial.   6. The inferior vena cava is dilated in size with >50% respiratory  variability, suggesting right atrial pressure of 8 mmHg.    ASSESSMENT:    No diagnosis found.    PLAN:  In order of problems listed above:  Chronic atrial fibrillation. Rate improved with discontinuation of diltiazem. Continue Eliquis. Continue current metoprolol dose.   CAD: s/p multiple interventions in the past- last in 2017. On Plavix and lopressor. On statin. Minimal angina.  Chronic diastolic CHF: volume status is good on current lasix dose. No edema and weight is stable.   Hyperlipidemia: On Lipitor 40 mg daily.  LDL 61  History of AAA repair: hasn't seen  Dr. Trula Slade in over 2 years. Will arrange follow up.  6..  DM type 2 on metformin.  A1c- 7.6.   Follow up in 6 months with lab.    Medication Adjustments/Labs and Tests Ordered: Current medicines are reviewed at length with the patient today.  Concerns regarding medicines are outlined above.  Medication changes, Labs and Tests ordered today are listed in the Patient Instructions below. There are no Patient Instructions on file for this visit.   Signed, Charlize Hathaway Martinique, MD  08/06/2020 1:59 PM    [facility] [address], [place], [place]  [address] Phone: [phone]; Fax: [phone]

## 2020-08-09 ENCOUNTER — Ambulatory Visit: Payer: Medicare Other | Admitting: Cardiology

## 2020-08-16 ENCOUNTER — Ambulatory Visit: Payer: Medicare Other | Admitting: Internal Medicine

## 2020-08-19 ENCOUNTER — Other Ambulatory Visit: Payer: Self-pay | Admitting: Emergency Medicine

## 2020-08-19 ENCOUNTER — Other Ambulatory Visit: Payer: Self-pay

## 2020-08-20 ENCOUNTER — Encounter: Payer: Self-pay | Admitting: Internal Medicine

## 2020-08-20 ENCOUNTER — Ambulatory Visit (INDEPENDENT_AMBULATORY_CARE_PROVIDER_SITE_OTHER): Payer: Medicare Other | Admitting: Internal Medicine

## 2020-08-20 VITALS — BP 132/92 | HR 55 | Temp 97.8°F | Wt 173.6 lb

## 2020-08-20 DIAGNOSIS — I10 Essential (primary) hypertension: Secondary | ICD-10-CM | POA: Diagnosis not present

## 2020-08-20 NOTE — Patient Instructions (Signed)
-  Nice seeing you today!!  -No changes today.  -See you back in 3 months.

## 2020-08-20 NOTE — Progress Notes (Signed)
Established Patient Office Visit     This visit occurred during the SARS-CoV-2 public health emergency.  Safety protocols were in place, including screening questions prior to the visit, additional usage of staff PPE, and extensive cleaning of exam room while observing appropriate contact time as indicated for disinfecting solutions.    CC/Reason for Visit: Follow-up blood pressure  HPI: Jason Fisher is a 80 y.o. male who is coming in today for the above mentioned reasons.  This is a 6-week visit for follow-up of blood pressure.  He is currently on diltiazem 120 mg daily, Lasix 80 mg daily, metoprolol 100 mg twice daily and hydralazine 50 mg 3 times a day.  He brings in his blood pressure log which I will insert below that shows continued elevated measurements.  He tells me he frequently misses doses of medication.  I also question if he is taking medications correctly as he is unable to tell me what his medications are and he also does not have a list of them with him.  He feels well and has no acute complaints.  He is requesting Eliquis samples if available.     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 2007 Inf STEMI s/p Vision BMS  2 to RCA;  b. 2009 ISR Prox RCA Stent-->with DES;  c. 09/2015 PCI: LM nl, LAD 30p/m, D1 90, LCX nl, OM1 small, 80, OM2 90 (2.5x14 Biofreedom stent), OM3 small, RCA 30p ISR, 75m ISR, 18m, 85d, EF 50-55%.   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 On Eliquis (CHA2DS2VASc = 5).  Rate controlled w/ BB and CCB.   Chronic diastolic CHF (congestive heart failure) ([place])    a. 09/2015 Echo: EF 55-60%, mildly dil LA.   CKD (chronic kidney disease), stage III ([place])    they mentioned this to the patient but they would work on it later after the aneurysm   COLONIC POLYPS, HX OF  11/02/2006   COPD (chronic obstructive pulmonary disease) ([place])    DIABETES MELLITUS, TYPE II 10/29/2006   Diverticulosis    History of tobacco abuse    a. Quit 09/2015.   HYPERLIPIDEMIA 10/29/2006   Hypertension    Hypertensive heart disease with heart failure ([place])    Pneumonia 09/2015   [name] 07/31/2016   STEMI (ST elevation myocardial infarction) [facility]) 2007   [name] 07/31/2016    Past Surgical History:  Procedure Laterality Date   ABDOMINAL AORTIC ENDOVASCULAR STENT GRAFT N/A 07/31/2016   Procedure: ABDOMINAL AORTIC ENDOVASCULAR STENT GRAFT;  Surgeon: Serafina Mitchell, MD;  Location: [facility];  Service: Vascular;  Laterality: N/A;   CARDIAC CATHETERIZATION N/A 09/26/2015   Procedure: Left Heart Cath and Coronary Angiography;  Surgeon: Peter M Martinique, MD;  Location: [facility] [place];  Service: Cardiovascular;  Laterality: N/A;   CARDIAC CATHETERIZATION N/A 09/26/2015   Procedure: Coronary Stent Intervention;  Surgeon: Peter M Martinique, MD;  Location: [facility];  Service: Cardiovascular;  Laterality: N/A;   CATARACT EXTRACTION Bilateral    COLONOSCOPY     CORONARY ANGIOPLASTY WITH STENT PLACEMENT  2007   Inferior STEMI 2007 - RCA PCI with 2 Vision BMS; Abnormal Myoview in 2009 - pRCA ins-stent restenosis & unstented disease - DES PCI Promus 3.0 x 20 mm (3.5 mm)  ENDOVASCULAR REPAIR/STENT GRAFT  07/31/2016   INCISION AND DRAINAGE PERIRECTAL ABSCESS N/A 11/06/2015   Procedure: IRRIGATION AND DEBRIDEMENT PERIRECTAL ABSCESS, POSSIBLE HEMORRHOIDECTOMY;  Surgeon: Coralie Keens, MD;  Location: [facility];  Service: General;  Laterality: N/A;   ORIF SHOULDER FRACTURE Right    "fell out of back end of a truck"    Social History:  reports that he has been smoking cigarettes. He has a 49.00 pack-year smoking history. He has never used smokeless tobacco. He reports that he does not drink alcohol and does not use drugs.  Allergies: Allergies  Allergen Reactions   Contrast Media [Iodinated  Diagnostic Agents] Anaphylaxis    Family History:  Family History  Problem Relation Age of Onset   Heart attack Mother    Diabetes Mother    Heart attack Father    Hypertension Father    Heart attack Brother    Diabetes Brother      Current Outpatient Medications:    ACCU-CHEK AVIVA PLUS test strip, CHECK BLOOD SUGAR ONCE DAILY. DX E11.51, Disp: 100 strip, Rfl: 2   atorvastatin (LIPITOR) 40 MG tablet, TAKE 1 TABLET BY MOUTH EVERY DAY (Patient taking differently: Take 40 mg by mouth daily.), Disp: 90 tablet, Rfl: 1   BEVESPI AEROSPHERE 9-4.8 MCG/ACT AERO, TAKE 2 PUFFS BY MOUTH TWICE A DAY, Disp: 10.7 each, Rfl: 5   clopidogrel (PLAVIX) 75 MG tablet, TAKE 1 TABLET BY MOUTH EVERY DAY (Patient taking differently: Take 75 mg by mouth daily.), Disp: 90 tablet, Rfl: 3   diltiazem (CARDIZEM CD) 120 MG 24 hr capsule, TAKE 1 CAPSULE BY MOUTH EVERY DAY, Disp: 30 capsule, Rfl: 1   ELIQUIS 5 MG TABS tablet, TAKE 1 TABLET BY MOUTH TWICE A DAY, Disp: 180 tablet, Rfl: 1   furosemide (LASIX) 80 MG tablet, Take 1 tablet (80 mg total) by mouth daily., Disp: 180 tablet, Rfl: 1   hydrALAZINE (APRESOLINE) 50 MG tablet, Take 1 tablet (50 mg total) by mouth 3 (three) times daily., Disp: 270 tablet, Rfl: 0   levalbuterol (XOPENEX HFA) 45 MCG/ACT inhaler, Inhale 1-2 puffs into the lungs every 6 (six) hours as needed for wheezing., Disp: 1 Inhaler, Rfl: 3   metFORMIN (GLUCOPHAGE) 1000 MG tablet, TAKE 1 TABLET TWICE DAILY WITH A MEAL (Patient taking differently: Take 1,000 mg by mouth 2 (two) times daily with a meal. TAKE 1 TABLET TWICE DAILY WITH A MEAL), Disp: 180 tablet, Rfl: 4   metoprolol tartrate (LOPRESSOR) 100 MG tablet, Take 1 tablet (100 mg total) by mouth 2 (two) times daily., Disp: 180 tablet, Rfl: 3   nitroGLYCERIN (NITROSTAT) 0.4 MG SL tablet, Place 1 tablet (0.4 mg total) under the tongue every 5 (five) minutes as needed for chest pain. X 3 doses, Disp: 25 tablet, Rfl: 4   polyethylene glycol (MIRALAX  / GLYCOLAX) packet, Take 17 g by mouth daily as needed for mild constipation., Disp: , Rfl:    TRESIBA FLEXTOUCH 100 UNIT/ML FlexTouch Pen, INJECT 32 UNITS INTO THE SKIN AT BEDTIME. (Patient taking differently: Inject 32 Units into the skin at bedtime.), Disp: 15 mL, Rfl: 2  Review of Systems:  Constitutional: Denies fever, chills, diaphoresis, appetite change and fatigue.  HEENT: Denies photophobia, eye pain, redness, hearing loss, ear pain, congestion, sore throat, rhinorrhea, sneezing, mouth sores, trouble swallowing, neck pain, neck stiffness and tinnitus.   Respiratory: Denies SOB, DOE, cough, chest tightness,  and wheezing.   Cardiovascular: Denies chest pain, palpitations and leg swelling.  Gastrointestinal: Denies nausea, vomiting, abdominal  pain, diarrhea, constipation, blood in stool and abdominal distention.  Genitourinary: Denies dysuria, urgency, frequency, hematuria, flank pain and difficulty urinating.  Endocrine: Denies: hot or cold intolerance, sweats, changes in hair or nails, polyuria, polydipsia. Musculoskeletal: Denies myalgias, back pain, joint swelling, arthralgias and gait problem.  Skin: Denies pallor, rash and wound.  Neurological: Denies dizziness, seizures, syncope, weakness, light-headedness, numbness and headaches.  Hematological: Denies adenopathy. Easy bruising, personal or family bleeding history  Psychiatric/Behavioral: Denies suicidal ideation, mood changes, confusion, nervousness, sleep disturbance and agitation    Physical Exam: Vitals:   08/20/20 1108  BP: (!) 132/92  Pulse: (!) 55  Temp: 97.8 F (36.6 C)  TempSrc: Oral  SpO2: 97%  Weight: 173 lb 9.6 oz (78.7 kg)    Body mass index is 26.4 kg/m.   Constitutional: NAD, calm, comfortable Eyes: PERRL, lids and conjunctivae normal, wears corrective lenses ENMT: Mucous membranes are moist.  Respiratory: clear to auscultation bilaterally, no wheezing, no crackles. Normal respiratory effort. No  accessory muscle use.  Cardiovascular: Regular rate and rhythm, no murmurs / rubs / gallops. No extremity edema.  Psychiatric: Normal judgment and insight. Alert and oriented x 3. Normal mood.    Impression and Plan:  Essential hypertension  - Plan: AMB Referral to [facility] Coordination -Continue current medications as described above in HPI. -I will send referral to embedded pharmacist to help with medication adherence. -Before I again adjust complex antihypertensive regimen, I would like to make sure that he is taking current medications as prescribed.    Patient Instructions  -Nice seeing you today!!  -No changes today.  -See you back in 3 months.    Lelon Frohlich, MD [facility]

## 2020-08-26 ENCOUNTER — Telehealth: Payer: Self-pay | Admitting: Internal Medicine

## 2020-08-26 NOTE — Progress Notes (Signed)
  Chronic Care Management   Outreach Note  08/26/2020 Name: ALEXES LANNIN MRN: [id number] DOB: 1940/10/08  Referred by: [name], [name], MD Reason for referral : No chief complaint on file.   An unsuccessful telephone outreach was attempted today. The patient was referred to the pharmacist for assistance with care management and care coordination.   Follow Up Plan:   Tatjana Dellinger Upstream Scheduler

## 2020-08-26 NOTE — Progress Notes (Signed)
  Chronic Care Management   Note  08/26/2020 Name: Jason Fisher MRN: [id number] DOB: [birth year]  Jason Fisher is a 80 y.o. year old male who is a primary care patient of [name], [name], MD. I reached out to [name] by phone today in response to a referral sent by Jason Fisher's PCP, [name], [name], MD.   Mr. Chapel was given information about Chronic Care Management services today including:  CCM service includes personalized support from designated clinical staff supervised by his physician, including individualized plan of care and coordination with other care providers 24/7 contact phone numbers for assistance for urgent and routine care needs. Service will only be billed when office clinical staff spend 20 minutes or more in a month to coordinate care. Only one practitioner may Fisher and bill the service in a calendar month. The patient may stop CCM services at any time (effective at the end of the month) by phone call to the office staff.   Jason Fisher verbally agreed to assistance and services provided by embedded care coordination/care management team today.  Follow up plan:   Tatjana Secretary/administrator

## 2020-08-31 ENCOUNTER — Other Ambulatory Visit: Payer: Self-pay | Admitting: Internal Medicine

## 2020-09-07 ENCOUNTER — Other Ambulatory Visit: Payer: Self-pay | Admitting: Internal Medicine

## 2020-09-10 ENCOUNTER — Other Ambulatory Visit: Payer: Self-pay | Admitting: Internal Medicine

## 2020-09-10 DIAGNOSIS — I480 Paroxysmal atrial fibrillation: Secondary | ICD-10-CM

## 2020-09-18 ENCOUNTER — Telehealth: Payer: Self-pay | Admitting: Pharmacist

## 2020-09-18 NOTE — Chronic Care Management (AMB) (Signed)
Chronic Care Management Pharmacy Assistant   Name: Jason Fisher  MRN: [id number] DOB: 04/19/1940  Reason for Encounter: Chart Review for Initial appointment with [name] Clinical Pharmacist on 09-23-20 at 11 am in office.   Conditions to be addressed/monitored: CHF, HTN, HLD, COPD, DMII, and CKD Stage 3   Recent office visits:  08-20-2020 [name], [name], MD - Patient presented for Essential hypertension. No medication changes.  07-05-2020 [name], [name], MD - Patient presented for Essential hypertension. Changed Hydralazine to 50 mg three times daily.  05-24-2020 [name], [name], MD - Patient presented for Hospital discharge follow-up. No medication changes.  05-10-2020 [name], [name], MD - Patient presented for DM (diabetes mellitus), type 2 with peripheral vascular complications and other concerns. No medication changes.  Recent consult visits:  07-29-2020 Karoline Caldwell, PA-C (Vasc. Surg) - Patient presented for Abdominal aortic aneurysm (AAA) without rupture ([place]). No medication changes.  07-29-2020 Serafina Mitchell, MD - Patient presented to [facility] for EVAR Duplex  06-20-2020 Collene Gobble, MD (Pulmonology) - Patient presented for Chronic bronchitis, unspecified chronic bronchitis type. No medication changes.   Hospital visits:  Medication Reconciliation was completed by comparing discharge summary, patient's EMR and Pharmacy list, and upon discussion with patient.  Patient presented to [facility] on 05-10-2020 due to Acute exacerbation of CHF. Discharge date was 05-12-2020.     New?Medications Started at [facility] Discharge:?? -started  Diltiazem 120 MG 24 hr capsule- Take 1 capsule (120 mg total) by mouth daily.  Medication Changes at Hospital Discharge: -Changed  Eliquis 5 MG Tabs tablet- TAKE 1 TABLET BY MOUTH TWICE A DAY Metformin 1000 MG tablet - TAKE 1 TABLET TWICE DAILY WITH A  MEAL  Medications Discontinued at Hospital Discharge: -Stopped  None  Medications that remain the same after Hospital Discharge:??  -All other medications will remain the same.    Medications: Outpatient Encounter Medications as of 09/18/2020  Medication Sig   ACCU-CHEK AVIVA PLUS test strip CHECK BLOOD SUGAR ONCE DAILY. DX E11.51   atorvastatin (LIPITOR) 40 MG tablet TAKE 1 TABLET BY MOUTH EVERY DAY (Patient taking differently: Take 40 mg by mouth daily.)   BEVESPI AEROSPHERE 9-4.8 MCG/ACT AERO TAKE 2 PUFFS BY MOUTH TWICE A DAY   clopidogrel (PLAVIX) 75 MG tablet TAKE 1 TABLET BY MOUTH EVERY DAY (Patient taking differently: Take 75 mg by mouth daily.)   diltiazem (CARDIZEM CD) 120 MG 24 hr capsule TAKE 1 CAPSULE BY MOUTH EVERY DAY   ELIQUIS 5 MG TABS tablet TAKE 1 TABLET BY MOUTH TWICE A DAY   furosemide (LASIX) 80 MG tablet Take 1 tablet (80 mg total) by mouth daily.   hydrALAZINE (APRESOLINE) 25 MG tablet Take 1 tablet (25 mg total) by mouth 3 (three) times daily.   hydrALAZINE (APRESOLINE) 50 MG tablet Take 1 tablet (50 mg total) by mouth 3 (three) times daily.   levalbuterol (XOPENEX HFA) 45 MCG/ACT inhaler Inhale 1-2 puffs into the lungs every 6 (six) hours as needed for wheezing.   metFORMIN (GLUCOPHAGE) 1000 MG tablet TAKE 1 TABLET TWICE DAILY WITH A MEAL (Patient taking differently: Take 1,000 mg by mouth 2 (two) times daily with a meal. TAKE 1 TABLET TWICE DAILY WITH A MEAL)   metoprolol tartrate (LOPRESSOR) 100 MG tablet Take 1 tablet (100 mg total) by mouth 2 (two) times daily.   nitroGLYCERIN (NITROSTAT) 0.4 MG SL tablet Place 1 tablet (0.4 mg total) under the tongue  every 5 (five) minutes as needed for chest pain. X 3 doses   polyethylene glycol (MIRALAX / GLYCOLAX) packet Take 17 g by mouth daily as needed for mild constipation.   TRESIBA FLEXTOUCH 100 UNIT/ML FlexTouch Pen INJECT 32 UNITS INTO THE SKIN AT BEDTIME. (Patient taking differently: Inject 32 Units into the skin at  bedtime.)   No facility-administered encounter medications on file as of 09/18/2020.    Fill History  : ELIQUIS 5 MG TABLET 07/11/2020 30   ATORVASTATIN 40 MG TABLET 08/19/2020 90   CLOPIDOGREL 75 MG TABLET 09-07-2020 90   FUROSEMIDE 80 MG TABLET 06/20/2020 90   ACCU-CHEK AVIVA PLUS TEST STRP 10/08/2019 90   BEVESPI AEROSPHERE INHALER 08/19/2020 30   TRESIBA FLEXTOUCH 100 UNIT/ML 08/19/2020 46   METOPROLOL TARTRATE 100 MG TAB 07/15/2020 90   NITROGLYCERIN 0.4 MG TABLET SL 05/15/2019 7   DILTIAZEM 24H ER(CD) 120 MG CP 08/29/2020 30   HYDRALAZINE 50 MG TABLET 07/05/2020 90   HYDRALAZINE 25 MG TABLET 08-10/2020 90   METFORMIN HCL 1,000 MG TABLET 09-07-2020 90  Notes: Call to pharmacy spoke to [facility] he verified the above fill dates stated his furosemide is ready to be picked up but he has not picked up yet.   Spoke to patients wife for the call Have you seen any other providers since your last visit? She reports no  Any changes in your medications or health? She reports no  Any side effects from any medications? She reports that he often complains all throughout the day of stomach pains.  Do you have an symptoms or problems not managed by your medications? She reports she thinks his medications are working well for him.  Any concerns about your health right now? She reports no  Has your provider asked that you check blood pressure, blood sugar, or follow special diet at home? She states she checks his sugars every now and again for him, upon waking it runs around 125  Do you get any type of exercise on a regular basis? She reports he does get in his walks and still does the yard work.  Can you think of a goal you would like to reach for your health? She reports he just wants to feel young again  Do you have any problems getting your medications? She reports they are currently happy with the pharmacy they use no issues.  Is there anything that you would like to  discuss during the appointment? She reports no  She was made aware to bring medications and supplements that do not need to be refrigerated to appointment and a copy of his blood sugar readings. She was in agreement and confirmed the appointment as well.  Care Gaps: Hepatitis C Screening - Overdue Eye Exam - Overdue Foot Exam - Overdue Flu Vaccine - Overdue AWV - MSG sent to Ramond Craver CMA to schedule.  Star Rating Drugs: Metformin (Glucophage) 1000 mg - [facility]-02-2020 [facility] Atorvastatin (Lipitor) 40 mg - Last filled 08-19-2020- 90 DS at [place] Pharmacist Assistant [phone]

## 2020-09-22 NOTE — Progress Notes (Signed)
Chronic Care Management Pharmacy Note  09/23/2020 Name:  LARONN DEVONSHIRE MRN:  [id number] DOB:  1940/04/11  Summary: BP not at goal < 130/80 per home and office readings A1c not at goal < 7% LDL not at goal < 70  Recommendations/Changes made from today's visit: -Recommended taking hydralazine 50 mg three times daily as prescribed -Recommended use of arm BP cuff and brining cuff to office to compare readings -Filled out PAP for Eliquis, Bevespi and Tresiba -Recommended moving diltiazem to afternoon with second dose of hydralazine  Plan: Follow up BP assessment in 3 weeks   Subjective: KELSIE KRAMP is an 80 y.o. year old male who is a primary patient of [name], [name], MD.  The CCM team was consulted for assistance with disease management and care coordination needs.    Engaged with patient face to face for initial visit in response to provider referral for pharmacy case management and/or care coordination services.   Consent to Services:  The patient was given the following information about Chronic Care Management services today, agreed to services, and gave verbal consent: 1. CCM service includes personalized support from designated clinical staff supervised by the primary care provider, including individualized plan of care and coordination with other care providers 2. 24/7 contact phone numbers for assistance for urgent and routine care needs. 3. Service will only be billed when office clinical staff spend 20 minutes or more in a month to coordinate care. 4. Only one practitioner may furnish and bill the service in a calendar month. 5.The patient may stop CCM services at any time (effective at the end of the month) by phone call to the office staff. 6. The patient will be responsible for cost sharing (co-pay) of up to 20% of the service fee (after annual deductible is met). Patient agreed to services and consent obtained.  Patient Care Team: [name], [name],  MD as PCP - General (Internal Medicine) Martinique, Peter M, MD as PCP - Cardiology (Cardiology) [place], Peter M, MD as Consulting Physician (Cardiology) Gatha Mayer, MD as Consulting Physician (Gastroenterology) Viona Gilmore, [facility] as Pharmacist (Pharmacist)  Recent office visits: 08-20-2020 [name], [name], MD - Patient presented for Essential hypertension. No medication changes.   07-05-2020 [name], [name], MD - Patient presented for Essential hypertension. Changed Hydralazine to 50 mg three times daily.   05-24-2020 [name], [name], MD - Patient presented for Hospital discharge follow-up. No medication changes.   05-10-2020 [name], [name], MD - Patient presented for DM (diabetes mellitus), type 2 with peripheral vascular complications and other concerns. No medication changes.  Recent consult visits: 07-29-2020 Karoline Caldwell, PA-C (Vasc. Surg) - Patient presented for Abdominal aortic aneurysm (AAA) without rupture ([place]). No medication changes.   07-29-2020 Serafina Mitchell, MD - Patient presented to [facility] for EVAR Duplex   06-20-2020 Collene Gobble, MD (Pulmonology) - Patient presented for Chronic bronchitis, unspecified chronic bronchitis type. No medication changes.  Hospital visits: Medication Reconciliation was completed by comparing discharge summary, patient's EMR and Pharmacy list, and upon discussion with patient.   Patient presented to [facility] on 05-10-2020 due to Acute exacerbation of CHF. Discharge date was 05-12-2020.      New?Medications Started at [facility] Discharge:?? -started  Diltiazem 120 MG 24 hr capsule- Take 1 capsule (120 mg total) by mouth daily.   Medication Changes at Hospital Discharge: -Changed  Eliquis 5 MG Tabs tablet- TAKE 1 TABLET  BY MOUTH TWICE A DAY Metformin 1000 MG tablet - TAKE 1 TABLET TWICE DAILY WITH A MEAL   Medications Discontinued at Hospital Discharge: -Stopped   None   Medications that remain the same after Hospital Discharge:??  -All other medications will remain the same.       Objective:  Lab Results  Component Value Date   CREATININE 1.61 (H) 08/02/2020   BUN 24 08/02/2020   GFR 54.45 (L) 12/21/2017   GFRNONAA 59 (L) 05/12/2020   GFRAA 56 (L) 03/06/2019   NA 145 (H) 08/02/2020   K 4.0 08/02/2020   CALCIUM 10.0 08/02/2020   CO2 27 08/02/2020   GLUCOSE 139 (H) 08/02/2020    Lab Results  Component Value Date/Time   HGBA1C 7.1 (H) 08/02/2020 10:33 AM   HGBA1C 6.4 (A) 05/10/2020 10:37 AM   HGBA1C 7.0 (A) 02/07/2020 10:45 AM   HGBA1C 7.6 (H) 03/06/2019 02:04 PM   GFR 54.45 (L) 12/21/2017 10:03 AM   GFR 61.30 05/24/2017 11:34 AM   MICROALBUR 3.7 (H) 08/30/2015 08:21 AM   MICROALBUR 2.1 (H) 04/22/2015 11:12 AM    Last diabetic Eye exam:  Lab Results  Component Value Date/Time   HMDIABEYEEXA Retinopathy (A) 03/22/2017 12:27 PM    Last diabetic Foot exam:  Lab Results  Component Value Date/Time   HMDIABFOOTEX done 12/10/2011 12:00 AM     Lab Results  Component Value Date   CHOL 141 08/02/2020   HDL 39 (L) 08/02/2020   LDLCALC 82 08/02/2020   TRIG 110 08/02/2020   CHOLHDL 3.6 08/02/2020    Hepatic Function Latest Ref Rng & Units 08/02/2020 05/11/2020 05/10/2020  Total Protein 6.0 - 8.5 g/dL 7.6 7.5 7.1  Albumin 3.7 - 4.7 g/dL 4.5 3.9 3.6  AST 0 - 40 IU/L '17 28 31  ' ALT 0 - 44 IU/L '13 27 26  ' Alk Phosphatase 44 - 121 IU/L 91 88 89  Total Bilirubin 0.0 - 1.2 mg/dL 0.5 1.3(H) 1.1  Bilirubin, Direct 0.00 - 0.40 mg/dL 0.19 - -    Lab Results  Component Value Date/Time   TSH 0.424 04/22/2016 03:55 AM   TSH 2.47 08/30/2015 08:19 AM   TSH 2.01 03/07/2014 10:38 AM   FREET4 1.12 01/14/2010 05:55 AM    CBC Latest Ref Rng & Units 05/11/2020 05/10/2020 03/06/2019  WBC 4.0 - 10.5 K/uL 10.4 9.1 8.7  Hemoglobin 13.0 - 17.0 g/dL 12.9(L) 12.0(L) 14.2  Hematocrit 39.0 - 52.0 % 40.7 37.6(L) 43.7  Platelets 150 - 400 K/uL 194 168 180     No results found for: VD25OH  Clinical ASCVD: Yes  The ASCVD Risk score Mikey Bussing DC Jr., et al., 2013) failed to calculate for the following reasons:   The patient has a prior MI or stroke diagnosis    Depression screen [facility] 2/9 05/24/2020 05/12/2019 05/12/2019  Decreased Interest 0 0 0  Down, Depressed, Hopeless 0 0 0  PHQ - 2 Score 0 0 0  Altered sleeping 0 0 0  Tired, decreased energy 0 0 0  Change in appetite 0 0 0  Feeling bad or failure about yourself  0 0 0  Trouble concentrating 0 0 0  Moving slowly or fidgety/restless 0 0 0  Suicidal thoughts 0 0 0  PHQ-9 Score 0 0 0  Difficult doing work/chores Not difficult at all Not difficult at all -  Some recent data might be hidden     CHA2DS2/VAS Stroke Risk Points  Current as of 2 hours ago  6 >= 2 Points: High Risk  1 - 1.99 Points: Medium Risk  0 Points: Low Risk    Last Change: N/A      Details    This score determines the patient's risk of having a stroke if the  patient has atrial fibrillation.       Points Metrics  1 Has Congestive Heart Failure:  Yes    Current as of 2 hours ago  1 Has Vascular Disease:  Yes    Current as of 2 hours ago  1 Has Hypertension:  Yes    Current as of 2 hours ago  2 Age:  38    Current as of 2 hours ago  1 Has Diabetes:  Yes    Current as of 2 hours ago  0 Had Stroke:  No  Had TIA:  No  Had Thromboembolism:  No    Current as of 2 hours ago  0 Male:  No    Current as of 2 hours ago          Social History   Tobacco Use  Smoking Status Some Days   Packs/day: 1.00   Years: 49.00   Pack years: 49.00   Types: Cigarettes   Last attempt to quit: 09/08/2015   Years since quitting: 5.0  Smokeless Tobacco Never  Tobacco Comments   passive smoker as well   BP Readings from Last 3 Encounters:  09/23/20 140/82  08/20/20 (!) 132/92  07/29/20 140/90   Pulse Readings from Last 3 Encounters:  08/20/20 (!) 55  07/29/20 84  07/05/20 94   Wt Readings from Last 3 Encounters:   08/20/20 173 lb 9.6 oz (78.7 kg)  07/29/20 173 lb 11.2 oz (78.8 kg)  07/05/20 177 lb 1.6 oz (80.3 kg)   BMI Readings from Last 3 Encounters:  08/20/20 26.40 kg/m  07/29/20 26.41 kg/m  07/05/20 26.93 kg/m    Assessment/Interventions: Review of patient past medical history, allergies, medications, health status, including review of consultants reports, laboratory and other test data, was performed as part of comprehensive evaluation and provision of chronic care management services.   SDOH:  (Social Determinants of Health) assessments and interventions performed: Yes SDOH Interventions    Flowsheet Row Most Recent Value  SDOH Interventions   Financial Strain Interventions Other (Comment)  [working on PAP for Eliquis, Bevespi and Tresiba]  Transportation Interventions Intervention Not Indicated      SDOH Screenings   Alcohol Screen: Not on file  Depression (PHQ2-9): Low Risk    PHQ-2 Score: 0  Financial Resource Strain: Medium Risk   Difficulty of Paying Living Expenses: Somewhat hard  Food Insecurity: Not on file  Housing: Not on file  Physical Activity: Not on file  Social Connections: Not on file  Stress: Not on file  Tobacco Use: High Risk   Smoking Tobacco Use: Some Days   Smokeless Tobacco Use: Never  Transportation Needs: No Transportation Needs   Lack of Transportation (Medical): No   Lack of Transportation (Non-Medical): No   Patient reports his days include yardwork such as cutting the grass or goes out in the building in his yard, he also works on the pool some as well. His wife swims in the pool but he does not.   Patient is not following a specific diet. He usually eats 2 meals a day. For breakfast, he eats eggs and sausage or bacon, or cheese and biscuit. For a snack he eats a moonpie and is not hungry during  the day. For dinner, he eats mashed potatoes and nuggets or ham and this is his biggest meal of the day. He admits to eating out once a week. He  doesn't eat frozen meals but does have instant mashed potatoes. He drinks water, sweet tea, mountain dew, and coffee (just in morning) during the day.  Patient doesn't do any structured exercise but some yard work and walks out to his mailbox and around the house some. He doesn't do anything consistently for more than a few minutes at a time.  Patient is not sleeping well. Patient has trouble staying asleep and has to go to the bathroom during the night and does not fall back asleep. Patient does nap during the day for an hour and doses off in the recliner. Recommended limiting napping and stopping screen time 30 minutes to 1 hour before bedtime. He does leave a little lamp on in his bedroom sometimes.  Patient reports Eliquis, [name] and Charolotte Eke are expensive.  CCM Care Plan  Allergies  Allergen Reactions   Contrast Media [Iodinated Diagnostic Agents] Anaphylaxis    Medications Reviewed Today     Reviewed by [name], [name], MD (Physician) on 08/20/20 at 1125  Med List Status: <None>   Medication Order Taking? Sig Documenting Provider Last Dose Status Informant  ACCU-CHEK AVIVA PLUS test strip [id number] Yes CHECK BLOOD SUGAR ONCE DAILY. DX E11.51 [name], [name], MD Taking Active Self  atorvastatin (LIPITOR) 40 MG tablet [id number] Yes TAKE 1 TABLET BY MOUTH EVERY DAY  Patient taking differently: Take 40 mg by mouth daily.   [name], [name], MD Taking Active   BEVESPI AEROSPHERE 9-4.8 MCG/ACT AERO [id number] Yes TAKE 2 PUFFS BY MOUTH TWICE A DAY Collene Gobble, MD Taking Active   clopidogrel (PLAVIX) 75 MG tablet [id number] Yes TAKE 1 TABLET BY MOUTH EVERY DAY  Patient taking differently: Take 75 mg by mouth daily.   [name], [name], MD Taking Active   diltiazem [facility] CD) 120 MG 24 hr capsule [id number] Yes TAKE 1 CAPSULE BY MOUTH EVERY DAY [name], [name], MD Taking Active   ELIQUIS 5 MG TABS tablet [id number] Yes TAKE 1 TABLET BY  MOUTH TWICE A DAY Martinique, Peter M, MD Taking Active   furosemide (LASIX) 80 MG tablet [id number] Yes Take 1 tablet (80 mg total) by mouth daily. [name], [name], MD Taking Active Self  hydrALAZINE (APRESOLINE) 50 MG tablet [id number] Yes Take 1 tablet (50 mg total) by mouth 3 (three) times daily. [name], [name], MD Taking Active   levalbuterol [facility]) 45 MCG/ACT inhaler [id number] Yes Inhale 1-2 puffs into the lungs every 6 (six) hours as needed for wheezing. Collene Gobble, MD Taking Active Self  metFORMIN (GLUCOPHAGE) 1000 MG tablet [id number] Yes TAKE 1 TABLET TWICE DAILY WITH A MEAL  Patient taking differently: Take 1,000 mg by mouth 2 (two) times daily with a meal. TAKE 1 TABLET TWICE DAILY WITH A MEAL   [name], [name], MD Taking Active   metoprolol tartrate (LOPRESSOR) 100 MG tablet [id number] Yes Take 1 tablet (100 mg total) by mouth 2 (two) times daily. Martinique, Peter M, MD Taking Active Self  nitroGLYCERIN (NITROSTAT) 0.4 MG SL tablet [id number] Yes Place 1 tablet (0.4 mg total) under the tongue every 5 (five) minutes as needed for chest pain. X 3 doses [name], [name], MD Taking Active Self  polyethylene glycol [facility] / GLYCOLAX) packet [id number] Yes Take 17  g by mouth daily as needed for mild constipation. [provider] Taking Active Self  TRESIBA FLEXTOUCH 100 UNIT/ML FlexTouch Pen [id number] Yes INJECT 32 UNITS INTO THE SKIN AT BEDTIME.  Patient taking differently: Inject 32 Units into the skin at bedtime.   [name], [name], MD Taking Active             Patient Active Problem List   Diagnosis Date Noted   Acute exacerbation of CHF (congestive heart failure) ([place]) 05/11/2020   Hyperglycemia due to diabetes mellitus ([place]) 05/11/2020   Elevated brain natriuretic peptide (BNP) level 05/11/2020   Hypertensive urgency 05/11/2020   Prolonged QT interval 05/11/2020   Elevated troponin 04/22/2016   COPD (chronic  obstructive pulmonary disease) ([place]) 02/07/2016   Abdominal aortic aneurysm (AAA) without rupture ([place])    Perirectal abscess 11/04/2015   Pulmonary nodules 11/04/2015   Coronary artery disease of bypass graft of native heart with stable angina pectoris ([place])    Allergic reaction to contrast dye 09/27/2015   Accelerated hypertension with diastolic congestive heart failure, NYHA class 3 ([place]) 09/27/2015   CHF (congestive heart failure) ([place]) 09/25/2015   CKD (chronic kidney disease), stage III (HCC)    Acute on chronic combined systolic and diastolic congestive heart failure (HCC)    Diastolic congestive heart failure (HCC)    Paroxysmal atrial fibrillation ([place])    Angiodysplasia of colon 04/19/2015   TOBACCO USER 04/17/2010   Chronic atrial fibrillation ([place]): CHA2DS2Vasc = 5. On Warfarin. Rate control 11/06/2009   Occlusion and stenosis of carotid artery without mention of cerebral infarction 07/13/2008   Abdominal aortic aneurysm ([place]) 07/13/2008   BENIGN PROSTATIC HYPERTROPHY, HX OF 02/24/2007   MYOCARDIAL INFARCTION, HX OF 11/02/2006   CAD S/P percutaneous coronary angioplasty 11/02/2006   History of colonic polyps 11/02/2006   Dyslipidemia 10/29/2006   Essential hypertension 10/29/2006    Immunization History  Administered Date(s) Administered   Fluad Quad(high Dose 65+) 02/07/2020   Influenza, High Dose Seasonal PF 01/21/2015, 02/05/2016, 11/23/2016, 10/28/2017   Influenza,inj,Quad PF,6+ Mos 10/31/2013   Influenza-Unspecified 11/23/2016, 12/25/2018   PFIZER(Purple Top)SARS-COV-2 Vaccination 06/01/2019, 07/02/2019   Pneumococcal Conjugate-13 01/09/2013   Pneumococcal Polysaccharide-23 03/07/2014   Tetanus 07/31/2013    Conditions to be addressed/monitored:  Hypertension, Hyperlipidemia, Diabetes, Atrial Fibrillation, Heart Failure, Coronary Artery Disease, COPD, and Tobacco use  Care Plan : [place]  Updates made by Viona Gilmore, [place] since 09/23/2020  12:00 AM     Problem: Problem: Hypertension, Hyperlipidemia, Diabetes, Atrial Fibrillation, Heart Failure, Coronary Artery Disease, COPD, and Tobacco use      Long-Range Goal: Patient-Specific Goal   Start Date: 09/23/2020  Expected End Date: 09/23/2021  This Visit's Progress: On track  Priority: High  Note:   Current Barriers:  Unable to independently afford treatment regimen Unable to achieve control of blood pressure  Unable to maintain control of cholesterol and diabetes Unable to self administer medications as prescribed  Pharmacist Clinical Goal(s):  Patient will verbalize ability to afford treatment regimen achieve adherence to monitoring guidelines and medication adherence to achieve therapeutic efficacy achieve control of blood pressure as evidenced by home BP readings maintain control of cholesterol and diabetes as evidenced by lipid panel and A1c  through collaboration with PharmD and provider.   Interventions: 1:1 collaboration with [name], [name], MD regarding development and update of comprehensive plan of care as evidenced by provider attestation and co-signature Inter-disciplinary care team collaboration (see longitudinal plan of care) Comprehensive medication  review performed; medication list updated in electronic medical record  Hypertension (BP goal <130/80) -Uncontrolled -Current treatment: Diltiazem 120 mg 1 capsule daily  Hydralazine 25 mg 1 tablet three times daily - morning, noon, 8pm Metoprolol tartrate 100 mg 1 tablet twice daily -Medications previously tried: unknown  -Current home readings: 147/84, 150/77, 170/88, 119/70, 128/78, 145/85, 158/80, 136/80 (checking daily with wrist cuff) -Current dietary habits: doesn't season with much salt; recommended looking at package labels and rinsing canned vegetables -Current exercise habits: no structured exercise -Denies hypotensive/hypertensive symptoms -Educated on BP goals and benefits of  medications for prevention of heart attack, stroke and kidney damage; Daily salt intake goal < 2300 mg; Exercise goal of 150 minutes per week; Importance of home blood pressure monitoring; Proper BP monitoring technique; -Counseled to monitor BP at home daily, document, and provide log at future appointments -Counseled on diet and exercise extensively Recommended to continue current medication Recommended taking hydralazine 50 mg three times daily as prescribed and not 25 mg three times daily. Recommended moving diltiazem to afternoon with hydralazine tablet.  Hyperlipidemia: (LDL goal < 70) -Uncontrolled -Current treatment: Atorvastatin 40 mg 1 tablet daily -Medications previously tried: none  -Current dietary patterns: doesn't fry foods often; uses vegetable or olive oil -Current exercise habits: no structured exercise -Educated on Cholesterol goals;  Benefits of statin for ASCVD risk reduction; Importance of limiting foods high in cholesterol; Exercise goal of 150 minutes per week; -Counseled on diet and exercise extensively Recommended to continue current medication Recommended repeat lipid panel in 3 months and consider dose increase of atorvastatin.  CAD (Goal: prevent heart events) -Controlled -Current treatment  Atorvastatin 40 mg 1 tablet daily Nitroglycerin 0.4 mg SL tablet as needed Clopidogrel 75 mg 1 tablet daily Metoprolol tartrate 100 mg 1 tablet twice daily -Medications previously tried: none  -Recommended to continue current medication Counseled on importance of checking expiration date of nitroglycerin. Requested a refill for patient as his is expired.    Diabetes (A1c goal <7%) -Not ideally controlled -Current medications: Tresiba 100 units/mL inject 32 units at bedtime Metformin 1000 mg 1 tablet twice daily at meals -Medications previously tried: unknown  -Current home glucose readings fasting glucose: patient stopped checking at home post prandial  glucose: atient stopped checking at home -Denies hypoglycemic/hyperglycemic symptoms -Current meal patterns:  breakfast: eggs and bacon or sausage OR biscuit and cheese  lunch: none  dinner: nuggets and mashed potatoes snacks: moonpie drinks: water, sweet tea, mt dew, coffee in AM -Current exercise: no structured exercise -Educated on A1c and blood sugar goals; Exercise goal of 150 minutes per week; Benefits of routine self-monitoring of blood sugar; Carbohydrate counting and/or plate method -Counseled to check feet daily and get yearly eye exams -Counseled on diet and exercise extensively Recommended to continue current medication Recommended for patient to start checking blood sugars daily Recommended for patient to take metformin with food to minimize GI upset  Atrial Fibrillation (Goal: prevent stroke and major bleeding) -Controlled -CHADSVASC: 6 -Current treatment: Rate control: Diltiazem 120 mg 1 capsule daily; Metoprolol tartrate 100 mg 1 tablet twice daily Anticoagulation: Eliquis 5 mg 1 tablet twice daily -Medications previously tried: none -Home BP and HR readings: could not provide  -Counseled on increased risk of stroke due to Afib and benefits of anticoagulation for stroke prevention; importance of adherence to anticoagulant exactly as prescribed; avoidance of NSAIDs due to increased bleeding risk with anticoagulants; -Counseled on diet and exercise extensively Recommended to continue current medication  Heart Failure (Goal:  manage symptoms and prevent exacerbations) -Controlled -Last ejection fraction: 50-55% (Date: 05/11/20) -HF type: Diastolic -NYHA Class: III (marked limitation of activity) -AHA HF Stage: C (Heart disease and symptoms present) -Current treatment: Furosemide 80 mg 1 tablet daily Metoprolol tartrate 100 mg 1 tablet twice daily -Medications previously tried: none  -Current home BP/HR readings: refer to above -Current dietary habits: limits use  of salt shaker; does not read nutrition labels for sodium content -Current exercise habits: no structured exercise -Educated on Benefits of medications for managing symptoms and prolonging life Importance of weighing daily; if you gain more than 3 pounds in one day or 5 pounds in one week, call cardiologist. -Counseled on diet and exercise extensively Recommended to continue current medication  COPD (Goal: control symptoms and prevent exacerbations) -Not ideally controlled -Current treatment  Levalbuterol 45 mcg/act 1-2 puffs every 4-6 hours as needed Bevespi aerosphere 9-4.8 mcg/act 2 puffs twice daily -Medications previously tried: unknown  -Gold Grade: Gold 3 (FEV1 30-49%) -Current COPD Classification:  B (high sx, <2 exacerbations/yr) -MMRC/CAT score: n/a -Pulmonary function testing: 2017 -Exacerbations requiring treatment in last 6 months: none -Patient reports consistent use of maintenance inhaler -Frequency of rescue inhaler use: once daily or more; every time he uses steps -Counseled on Proper inhaler technique; Benefits of consistent maintenance inhaler use When to use rescue inhaler Differences between maintenance and rescue inhalers - Consider triple inhaler therapy given current tobacco smoker and using rescue inhaler once a day or more.  Tobacco use (Goal quit smoking) -Uncontrolled -Previous quit attempts: none -Current treatment  No medications -Patient smokes After 30 minutes of waking -Patient triggers include:  enjoyment -On a scale of 1-10, reports MOTIVATION to quit is 9 -On a scale of 1-10, reports CONFIDENCE in quitting is 9 -Counseled on park & chew method for NRT gum. -Counseled on benefits of NRT or slowly backing down on use of cigarettes as breathing will improve with complete cessation.  Health Maintenance -Vaccine gaps:  influenza, shingrix -Current therapy:  Miralax as needed -Educated on Cost vs benefit of each product must be carefully weighed  by individual consumer -Patient is satisfied with current therapy and denies issues -Recommended to continue current medication  Patient Goals/Self-Care Activities Patient will:  - take medications as prescribed focus on medication adherence by setting alarms to remember to take medications check glucose daily, document, and provide at future appointments check blood pressure daily, document, and provide at future appointments target a minimum of 150 minutes of moderate intensity exercise weekly  Follow Up Plan: Telephone follow up appointment with care management team member scheduled for: 2 months       -pillbox with morning and night time - fills it up once a week -tries not to miss doses of medicines - night time will miss doses - 2-3 times a month -dinner  -9am morning meds -can move some to evening meal - diliazem      Medication Assistance: Application for [employer] and Eliquis  medication assistance program. in process.  Anticipated assistance start date 10/24/21.  See plan of care for additional detail.  Compliance/Adherence/Medication fill history: Care Gaps: Hep C screening, eye exam, foot exam, influenza vaccine  Star-Rating Drugs: Metformin (Glucophage) 1000 mg - Last [facility]-02-2020 [facility] Atorvastatin (Lipitor) 40 mg - Last filled 08-19-2020- [facility] at [facility]  Patient's preferred pharmacy is:  [id number] [place], [place] - [address]  [place] [address] Phone: [phone] Fax: [phone]  Uses pill box? Yes - fills it up one week at a time Pt endorses 90% compliance  We discussed: Benefits of medication synchronization, packaging and delivery as well as enhanced pharmacist oversight with [employer]. Patient decided to: Continue current medication management strategy  Care Plan and Follow Up Patient Decision:  Patient agrees to Care Plan and Follow-up.  Plan: Telephone  follow up appointment with care management team member scheduled for:  2 months  [name], PharmD, [place] Pharmacist [place] at [place] [phone]

## 2020-09-23 ENCOUNTER — Other Ambulatory Visit: Payer: Self-pay

## 2020-09-23 ENCOUNTER — Ambulatory Visit (INDEPENDENT_AMBULATORY_CARE_PROVIDER_SITE_OTHER): Payer: Medicare Other | Admitting: Pharmacist

## 2020-09-23 VITALS — BP 140/82

## 2020-09-23 DIAGNOSIS — I10 Essential (primary) hypertension: Secondary | ICD-10-CM

## 2020-09-23 DIAGNOSIS — E785 Hyperlipidemia, unspecified: Secondary | ICD-10-CM

## 2020-09-23 NOTE — Patient Instructions (Signed)
Hi Jason Fisher,   It was great to get to meet you in person! Below is a summary of some of the topics we discussed.   Please reach out to me if you have any questions or need anything before our follow up!  Best, Maddie  [name], PharmD, [place] at [place]   Visit Information   Goals Addressed             This Visit's Progress    Manage My Medicine       Timeframe:  Short-Term Goal Priority:  High Start Date:                             Expected End Date:                       Follow Up Date 12/24/20    - call for medicine refill 2 or 3 days before it runs out - keep a list of all the medicines I take; vitamins and herbals too - learn to read medicine labels - use a pillbox to sort medicine - use an alarm clock or phone to remind me to take my medicine    Why is this important?   These steps will help you keep on track with your medicines.   Notes:        Patient Care Plan: CCM Pharmacy Care Plan     Problem Identified: Problem: Hypertension, Hyperlipidemia, Diabetes, Atrial Fibrillation, Heart Failure, Coronary Artery Disease, COPD, and Tobacco use      Long-Range Goal: Patient-Specific Goal   Start Date: 09/23/2020  Expected End Date: 09/23/2021  This Visit's Progress: On track  Priority: High  Note:   Current Barriers:  Unable to independently afford treatment regimen Unable to achieve control of blood pressure  Unable to maintain control of cholesterol and diabetes Unable to self administer medications as prescribed  Pharmacist Clinical Goal(s):  Patient will verbalize ability to afford treatment regimen achieve adherence to monitoring guidelines and medication adherence to achieve therapeutic efficacy achieve control of blood pressure as evidenced by home BP readings maintain control of cholesterol and diabetes as evidenced by lipid panel and A1c  through collaboration with PharmD and provider.    Interventions: 1:1 collaboration with Jason Fisher, Jason Halsted, MD regarding development and update of comprehensive plan of care as evidenced by provider attestation and co-signature Inter-disciplinary care team collaboration (see longitudinal plan of care) Comprehensive medication review performed; medication list updated in electronic medical record  Hypertension (BP goal <130/80) -Uncontrolled -Current treatment: Diltiazem 120 mg 1 capsule daily Hydralazine 25 mg 1 tablet three times daily Metoprolol tartrate 100 mg 1 tablet twice daily -Medications previously tried: unknown  -Current home readings: 150-160/70s (checking daily with wrist cuff) -Current dietary habits: doesn't season with much salt; recommended looking at package labels and rinsing canned vegetables -Current exercise habits: no structured exercise -Denies hypotensive/hypertensive symptoms -Educated on BP goals and benefits of medications for prevention of heart attack, stroke and kidney damage; Daily salt intake goal < 2300 mg; Exercise goal of 150 minutes per week; Importance of home blood pressure monitoring; Proper BP monitoring technique; -Counseled to monitor BP at home daily, document, and provide log at future appointments -Counseled on diet and exercise extensively Recommended to continue current medication Recommended taking hydralazine 50 mg three times daily as prescribed and not 25 mg three times daily.  Hyperlipidemia: (LDL goal <  70) -Uncontrolled -Current treatment: Atorvastatin 40 mg 1 tablet daily -Medications previously tried: none  -Current dietary patterns: doesn't fry foods often; uses vegetable or olive oil -Current exercise habits: no structured exercise -Educated on Cholesterol goals;  Benefits of statin for ASCVD risk reduction; Importance of limiting foods high in cholesterol; Exercise goal of 150 minutes per week; -Counseled on diet and exercise extensively Recommended to  continue current medication Recommended repeat lipid panel in 3 months and consider dose increase of atorvastatin.  CAD (Goal: prevent heart events) -Controlled -Current treatment  Atorvastatin 40 mg 1 tablet daily Nitroglycerin 0.4 mg SL tablet as needed Clopidogrel 75 mg 1 tablet daily Metoprolol tartrate 100 mg 1 tablet twice daily -Medications previously tried: none  -Recommended to continue current medication Counseled on importance of checking expiration date of nitroglycerin. Requested a refill for patient as his is expired.    Diabetes (A1c goal <7%) -Not ideally controlled -Current medications: Tresiba 100 units/mL inject 32 units at bedtime Metformin 1000 mg 1 tablet twice daily at meals -Medications previously tried: unknown  -Current home glucose readings fasting glucose: patient stopped checking at home post prandial glucose: atient stopped checking at home -Denies hypoglycemic/hyperglycemic symptoms -Current meal patterns:  breakfast: eggs and bacon or sausage OR biscuit and cheese  lunch: none  dinner: nuggets and mashed potatoes snacks: moonpie drinks: water, sweet tea, mt dew, coffee in AM -Current exercise: no structured exercise -Educated on A1c and blood sugar goals; Exercise goal of 150 minutes per week; Benefits of routine self-monitoring of blood sugar; Carbohydrate counting and/or plate method -Counseled to check feet daily and get yearly eye exams -Counseled on diet and exercise extensively Recommended to continue current medication Recommended for patient to start checking blood sugars daily Recommended for patient to take metformin with food to minimize GI upset  Atrial Fibrillation (Goal: prevent stroke and major bleeding) -Controlled -CHADSVASC: 6 -Current treatment: Rate control: Diltiazem 120 mg 1 capsule daily; Metoprolol tartrate 100 mg 1 tablet twice daily Anticoagulation: Eliquis 5 mg 1 tablet twice daily -Medications previously  tried: none -Home BP and HR readings: could not provide  -Counseled on increased risk of stroke due to Afib and benefits of anticoagulation for stroke prevention; importance of adherence to anticoagulant exactly as prescribed; avoidance of NSAIDs due to increased bleeding risk with anticoagulants; -Counseled on diet and exercise extensively Recommended to continue current medication  Heart Failure (Goal: manage symptoms and prevent exacerbations) -Controlled -Last ejection fraction: 50-55% (Date: 05/11/20) -HF type: Diastolic -NYHA Class: III (marked limitation of activity) -AHA HF Stage: C (Heart disease and symptoms present) -Current treatment: Furosemide 80 mg 1 tablet daily Metoprolol tartrate 100 mg 1 tablet twice daily -Medications previously tried: none  -Current home BP/HR readings: refer to above -Current dietary habits: limits use of salt shaker; does not read nutrition labels for sodium content -Current exercise habits: no structured exercise -Educated on Benefits of medications for managing symptoms and prolonging life Importance of weighing daily; if you gain more than 3 pounds in one day or 5 pounds in one week, call cardiologist. -Counseled on diet and exercise extensively Recommended to continue current medication  COPD (Goal: control symptoms and prevent exacerbations) -Not ideally controlled -Current treatment  Levalbuterol 45 mcg/act 1-2 puffs every 4-6 hours as needed Bevespi aerosphere 9-4.8 mcg/act 2 puffs twice daily -Medications previously tried: unknown  -Gold Grade: Gold 3 (FEV1 30-49%) -Current COPD Classification:  B (high sx, <2 exacerbations/yr) -MMRC/CAT score: n/a -Pulmonary function testing: 2017 -Exacerbations requiring treatment  in last 6 months: none -Patient reports consistent use of maintenance inhaler -Frequency of rescue inhaler use: once daily or more; every time he uses steps -Counseled on Proper inhaler technique; Benefits of consistent  maintenance inhaler use When to use rescue inhaler Differences between maintenance and rescue inhalers - Consider triple inhaler therapy given current tobacco smoker and using rescue inhaler once a day or more.  Tobacco use (Goal quit smoking) -Uncontrolled -Previous quit attempts: none -Current treatment  No medications -Patient smokes After 30 minutes of waking -Patient triggers include:  enjoyment -On a scale of 1-10, reports MOTIVATION to quit is 9 -On a scale of 1-10, reports CONFIDENCE in quitting is 9 -Counseled on park & chew method for NRT gum. -Counseled on benefits of NRT or slowly backing down on use of cigarettes as breathing will improve with complete cessation.  Health Maintenance -Vaccine gaps:  influenza, shingrix -Current therapy:  Miralax as needed -Educated on Cost vs benefit of each product must be carefully weighed by individual consumer -Patient is satisfied with current therapy and denies issues -Recommended to continue current medication  Patient Goals/Self-Care Activities Patient will:  - take medications as prescribed focus on medication adherence by setting alarms to remember to take medications check glucose daily, document, and provide at future appointments check blood pressure daily, document, and provide at future appointments target a minimum of 150 minutes of moderate intensity exercise weekly  Follow Up Plan: Telephone follow up appointment with care management team member scheduled for: 3 months      Mr. Urlacher was given information about Chronic Care Management services today including:  CCM service includes personalized support from designated clinical staff supervised by his physician, including individualized plan of care and coordination with other care providers 24/7 contact phone numbers for assistance for urgent and routine care needs. Standard insurance, coinsurance, copays and deductibles apply for chronic care management only  during months in which we provide at least 20 minutes of these services. Most insurances cover these services at 100%, however patients may be responsible for any copay, coinsurance and/or deductible if applicable. This service may help you avoid the need for more expensive face-to-face services. Only one practitioner may furnish and bill the service in a calendar month. The patient may stop CCM services at any time (effective at the end of the month) by phone call to the office staff.  Patient agreed to services and verbal consent obtained.   The patient verbalized understanding of instructions, educational materials, and care plan provided today and agreed to receive a mailed copy of patient instructions, educational materials, and care plan.  Telephone follow up appointment with pharmacy team member scheduled for: 3 months  Jason Fisher, [facility]

## 2020-09-24 ENCOUNTER — Telehealth: Payer: Self-pay | Admitting: *Deleted

## 2020-09-24 DIAGNOSIS — I482 Chronic atrial fibrillation, unspecified: Secondary | ICD-10-CM

## 2020-09-24 MED ORDER — NITROGLYCERIN 0.4 MG SL SUBL: 0.4000 mg | SUBLINGUAL_TABLET | SUBLINGUAL | 4 refills | Status: DC | PRN

## 2020-09-24 NOTE — Telephone Encounter (Signed)
-----   Message from Viona Gilmore, [facility] sent at 09/23/2020 12:36 PM EDT ----- Regarding: Nitro refill Hi,  Mr. Stanco's nitroglycerin has expired. Can you please send in a refill of this for him?  Thanks! Maddie

## 2020-10-09 ENCOUNTER — Other Ambulatory Visit: Payer: Self-pay | Admitting: Internal Medicine

## 2020-10-09 ENCOUNTER — Encounter: Payer: Self-pay | Admitting: Physician Assistant

## 2020-10-09 ENCOUNTER — Other Ambulatory Visit: Payer: Self-pay

## 2020-10-09 ENCOUNTER — Ambulatory Visit: Payer: Medicare Other | Admitting: Physician Assistant

## 2020-10-09 VITALS — BP 150/102 | HR 91 | Ht 68.0 in | Wt 175.8 lb

## 2020-10-09 DIAGNOSIS — N183 Chronic kidney disease, stage 3 unspecified: Secondary | ICD-10-CM

## 2020-10-09 DIAGNOSIS — E785 Hyperlipidemia, unspecified: Secondary | ICD-10-CM

## 2020-10-09 DIAGNOSIS — I5032 Chronic diastolic (congestive) heart failure: Secondary | ICD-10-CM | POA: Diagnosis not present

## 2020-10-09 DIAGNOSIS — I10 Essential (primary) hypertension: Secondary | ICD-10-CM

## 2020-10-09 DIAGNOSIS — I4821 Permanent atrial fibrillation: Secondary | ICD-10-CM

## 2020-10-09 DIAGNOSIS — I2510 Atherosclerotic heart disease of native coronary artery without angina pectoris: Secondary | ICD-10-CM

## 2020-10-09 NOTE — Patient Instructions (Signed)
Medication Instructions:  Your physician recommends that you continue on your current medications as directed. Please refer to the Current Medication list given to you today.  *If you need a refill on your cardiac medications before your next appointment, please call your pharmacy*  Lab Work: Your physician recommends that you return for lab work TODAY:  BMET TSH  If you have labs (blood work) drawn today and your tests are completely normal, you will receive your results only by: [place] (if you have MyChart) OR A paper copy in the mail If you have any lab test that is abnormal or we need to change your treatment, we will call you to review the results.  Testing/Procedures: NONE ordered at this time of appointment   Follow-Up: At [facility], you and your health needs are our priority.  As part of our continuing mission to provide you with exceptional heart care, we have created designated Provider Care Teams.  These Care Teams include your primary Cardiologist (physician) and Advanced Practice Providers (APPs -  Physician Assistants and Nurse Practitioners) who all work together to provide you with the care you need, when you need it.  We recommend signing up for the patient portal called "MyChart".  Sign up information is provided on this After Visit Summary.  MyChart is used to connect with patients for Virtual Visits (Telemedicine).  Patients are able to view lab/test results, encounter notes, upcoming appointments, etc.  Non-urgent messages can be sent to your provider as well.   To learn more about what you can do with MyChart, go to [email or web address].    Your next appointment:   6 month(s)  The format for your next appointment:   In Person  Provider:   Peter Martinique, MD  Other Instructions Monitor weight at home. If weight gain of 3 lbs in day/overnight or 5 lbs in a weeks please give our office a call.

## 2020-10-09 NOTE — Progress Notes (Signed)
Cardiology Office Note:    Date:  10/11/2020   ID:  Jason Fisher, DOB [birth year], MRN [id number]  PCP:  [name], [name], MD   [facility] Providers Cardiologist:  Peter Martinique, MD     Referring MD: [name], [place]*   Chief Complaint  Patient presents with   Follow-up    Seen for Dr. Martinique    History of Present Illness:    Jason Fisher is a 80 y.o. male with a hx of CAD (s/p BMS to RCA 2007, ISR with DES to RCA in 2009, DES to OM2 in 09/2015), chronic atrial fibrillation on Eliquis, chronic diastolic heart failure, HTN, HLD, AAA, COPD, and tobacco use.  He was previously admitted in March 2018 for atrial fibrillation with RVR.  Troponin was borderline elevated.  He was initially treated with IV Cardizem and later switched to p.o. Cardizem prior to discharge.  He also underwent AAA endovascular stent grafting by Dr. Trula Slade in June 2018.  I last saw the patient in 2019 for increased edema.  Echocardiogram on 11/05/2017 this showed EF 60 to 65%, mildly increased aortic root measuring 40 mm, moderately dilated left and the right atrium.  Diltiazem was stopped and metoprolol dose reduced in September 2021 due to bradycardia.  However on follow-up a week later, heart rate went up to 120 while in A. fib, metoprolol was increased back up to 100 mg twice daily.  He was last seen by Dr. Martinique in December 2021.  He was admitted in April 2022 with worsening dyspnea on exertion.  While in the emergency room, blood pressure was 207/109.  He was admitted for acute on chronic diastolic heart failure and A. fib with RVR.  Echocardiogram obtained during that admission showed EF 50 to 55%, no regional wall motion abnormality, moderate LVH.  He was eventually released on the prior home dose of Lasix 80 mg daily.  Cardizem was added back every 6 hours and he was eventually transitioned to 120 mg daily of Cardizem CD.  AAA ultrasound obtained in June 2022 showed a stable sac measuring 3.98 in  transverse dimension.  Last creatinine obtained in July 2022 was 1.61 which is higher than his usual of 1.2-1.5 range.  Patient presents today for follow-up.  Although blood pressure today is 150/102, his blood pressure was in the 130s during the previous visit with PCP.  At the time, his hydralazine was increased from 25 mg 3 times a day to 50 mg 3 times a day.  I will hold off on uptitrating blood pressure medication further since his blood pressure was a lot lower than this even prior to increased blood pressure medication last time.  Otherwise, his weight is 173 pounds at home, previous discharge weight was 175 pounds in April.  His weight seems to be very stable.  Given the fact that his creatinine jumped in early July, I recommend a repeat basic metabolic panel and TSH today.   Past Medical History:  Diagnosis Date   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 2007 Inf STEMI s/p Vision BMS  2 to RCA;  b. 2009 ISR Prox RCA Stent-->with DES;  c. 09/2015 PCI: LM nl, LAD 30p/m, D1 90, LCX nl, OM1 small, 80, OM2 90 (2.5x14 Biofreedom stent), OM3 small, RCA 30p ISR, 43mISR, 456m85d, EF 50-55%.   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 On Eliquis (CHA2DS2VASc = 5).  Rate controlled w/ BB and CCB.   Chronic diastolic CHF (congestive heart failure) ([place])    a. 09/2015 Echo: EF 55-60%, mildly dil LA.   CKD (chronic kidney disease), stage III ([place])    they mentioned this to the patient but they would work on it later after the aneurysm   COLONIC POLYPS, HX OF 11/02/2006   COPD (chronic obstructive pulmonary disease) ([place])    DIABETES MELLITUS, TYPE II 10/29/2006   Diverticulosis    History of tobacco abuse    a. Quit 09/2015.   HYPERLIPIDEMIA 10/29/2006   Hypertension    Hypertensive heart disease with heart failure ([place])    Pneumonia 09/2015   [name] 07/31/2016   STEMI (ST elevation  myocardial infarction) [facility]) 2007   [name] 07/31/2016    Past Surgical History:  Procedure Laterality Date   ABDOMINAL AORTIC ENDOVASCULAR STENT GRAFT N/A 07/31/2016   Procedure: ABDOMINAL AORTIC ENDOVASCULAR STENT GRAFT;  Surgeon: Serafina Mitchell, MD;  Location: [place];  Service: Vascular;  Laterality: N/A;   CARDIAC CATHETERIZATION N/A 09/26/2015   Procedure: Left Heart Cath and Coronary Angiography;  Surgeon: Peter M Martinique, MD;  Location: [place] CV LAB;  Service: Cardiovascular;  Laterality: N/A;   CARDIAC CATHETERIZATION N/A 09/26/2015   Procedure: Coronary Stent Intervention;  Surgeon: Peter M Martinique, MD;  Location: [place] CV LAB;  Service: Cardiovascular;  Laterality: N/A;   CATARACT EXTRACTION Bilateral    COLONOSCOPY     CORONARY ANGIOPLASTY WITH STENT PLACEMENT  2007   Inferior STEMI 2007 - RCA PCI with 2 Vision BMS; Abnormal Myoview in 2009 - pRCA ins-stent restenosis & unstented disease - DES PCI Promus 3.0 x 20 mm (3.5 mm)   ENDOVASCULAR REPAIR/STENT GRAFT  07/31/2016   INCISION AND DRAINAGE PERIRECTAL ABSCESS N/A 11/06/2015   Procedure: IRRIGATION AND DEBRIDEMENT PERIRECTAL ABSCESS, POSSIBLE HEMORRHOIDECTOMY;  Surgeon: Coralie Keens, MD;  Location: [place];  Service: General;  Laterality: N/A;   ORIF SHOULDER FRACTURE Right    "fell out of back end of a truck"    Current Medications: Current Meds  Medication Sig   ACCU-CHEK AVIVA PLUS test strip CHECK BLOOD SUGAR ONCE DAILY. DX E11.51   atorvastatin (LIPITOR) 40 MG tablet TAKE 1 TABLET BY MOUTH EVERY DAY (Patient taking differently: Take 40 mg by mouth daily.)   BEVESPI AEROSPHERE 9-4.8 MCG/ACT AERO TAKE 2 PUFFS BY MOUTH TWICE A DAY   clopidogrel (PLAVIX) 75 MG tablet TAKE 1 TABLET BY MOUTH EVERY DAY (Patient taking differently: Take 75 mg by mouth daily.)   diltiazem (CARDIZEM CD) 120 MG 24 hr capsule TAKE 1 CAPSULE BY MOUTH EVERY DAY   ELIQUIS 5 MG TABS tablet TAKE 1 TABLET BY MOUTH TWICE A DAY   furosemide  (LASIX) 80 MG tablet Take 1 tablet (80 mg total) by mouth daily.   hydrALAZINE (APRESOLINE) 25 MG tablet Take 1 tablet (25 mg total) by mouth 3 (three) times daily.   hydrALAZINE (APRESOLINE) 50 MG tablet TAKE 1 TABLET BY MOUTH THREE TIMES A DAY   levalbuterol (XOPENEX HFA) 45 MCG/ACT inhaler Inhale 1-2 puffs into the lungs every 6 (six) hours as needed for wheezing.   metFORMIN (GLUCOPHAGE) 1000 MG tablet TAKE 1 TABLET TWICE DAILY WITH A MEAL (Patient taking differently: Take 1,000 mg by mouth 2 (two) times daily with a meal. TAKE 1 TABLET TWICE DAILY WITH A MEAL)   metoprolol tartrate (LOPRESSOR) 100 MG tablet Take 1  tablet (100 mg total) by mouth 2 (two) times daily.   nitroGLYCERIN (NITROSTAT) 0.4 MG SL tablet Place 1 tablet (0.4 mg total) under the tongue every 5 (five) minutes as needed for chest pain. X 3 doses   polyethylene glycol (MIRALAX / GLYCOLAX) packet Take 17 g by mouth daily as needed for mild constipation.   TRESIBA FLEXTOUCH 100 UNIT/ML FlexTouch Pen INJECT 32 UNITS INTO THE SKIN AT BEDTIME. (Patient taking differently: Inject 32 Units into the skin at bedtime.)     Allergies:   Contrast media [iodinated diagnostic agents]   Social History   Socioeconomic History   Marital status: Married    Spouse name: Not on file   Number of children: 5   Years of education: Not on file   Highest education level: Not on file  Occupational History   Occupation: [employer]  Tobacco Use   Smoking status: Some Days    Packs/day: 1.00    Years: 49.00    Pack years: 49.00    Types: Cigarettes    Last attempt to quit: 09/08/2015    Years since quitting: 5.0   Smokeless tobacco: Never   Tobacco comments:    passive smoker as well  Vaping Use   Vaping Use: Never used  Substance and Sexual Activity   Alcohol use: No    Alcohol/week: 0.0 standard drinks   Drug use: No   Sexual activity: Not on file  Other Topics Concern   Not on file  Social History Narrative   Not on file    Social Determinants of Health   Financial Resource Strain: Medium Risk   Difficulty of Paying Living Expenses: Somewhat hard  Food Insecurity: Not on file  Transportation Needs: No Transportation Needs   Lack of Transportation (Medical): No   Lack of Transportation (Non-Medical): No  Physical Activity: Not on file  Stress: Not on file  Social Connections: Not on file     Family History: The patient's family history includes Diabetes in his brother and mother; Heart attack in his brother, father, and mother; Hypertension in his father.  ROS:   Please see the history of present illness.     All other systems reviewed and are negative.  EKGs/Labs/Other Studies Reviewed:    The following studies were reviewed today:  Echo 05/11/2020  1. Left ventricular ejection fraction, by estimation, is 50 to 55%. The  left ventricle has low normal function. The left ventricle has no regional  wall motion abnormalities. There is moderate left ventricular hypertrophy.  Left ventricular diastolic  function could not be evaluated.   2. Right ventricular systolic function is normal. The right ventricular  size is normal. There is normal pulmonary artery systolic pressure.   3. Left atrial size was mildly dilated.   4. The mitral valve is grossly normal. Trivial mitral valve  regurgitation.   5. The aortic valve is grossly normal. Aortic valve regurgitation is  trivial.   6. The inferior vena cava is dilated in size with >50% respiratory  variability, suggesting right atrial pressure of 8 mmHg.   EKG:  EKG is ordered today.  The ekg ordered today demonstrates atrial fibrillation, heart rate 91 bpm  Recent Labs: 05/10/2020: B Natriuretic Peptide 696.0 05/11/2020: Hemoglobin 12.9; Magnesium 1.8; Platelets 194 08/02/2020: ALT 13 10/09/2020: BUN 18; Creatinine, Ser 1.36; Potassium 4.0; Sodium 144; TSH 2.090  Recent Lipid Panel    Component Value Date/Time   CHOL 141 08/02/2020 1033   TRIG 110  08/02/2020  1033   HDL 39 (L) 08/02/2020 1033   CHOLHDL 3.6 08/02/2020 1033   CHOLHDL 2.9 09/25/2015 0221   VLDL 13 09/25/2015 0221   LDLCALC 82 08/02/2020 1033     Risk Assessment/Calculations:    CHA2DS2-VASc Score = 5   This indicates a 7.2% annual risk of stroke. The patient's score is based upon: CHF History: 1 HTN History: 1 Diabetes History: 0 Stroke History: 0 Vascular Disease History: 1 Age Score: 2 Gender Score: 0         Physical Exam:    VS:  BP (!) 150/102 (BP Location: Right Arm)   Pulse 91   Ht '5\' 8"'$  (1.727 m)   Wt 175 lb 12.8 oz (79.7 kg)   SpO2 98%   BMI 26.73 kg/m     Wt Readings from Last 3 Encounters:  10/09/20 175 lb 12.8 oz (79.7 kg)  08/20/20 173 lb 9.6 oz (78.7 kg)  07/29/20 173 lb 11.2 oz (78.8 kg)     GEN:  Well nourished, well developed in no acute distress HEENT: Normal NECK: No JVD; No carotid bruits LYMPHATICS: No lymphadenopathy CARDIAC: RRR, no murmurs, rubs, gallops RESPIRATORY:  Clear to auscultation without rales, wheezing or rhonchi  ABDOMEN: Soft, non-tender, non-distended MUSCULOSKELETAL:  No edema; No deformity  SKIN: Warm and dry NEUROLOGIC:  Alert and oriented x 3 PSYCHIATRIC:  Normal affect   ASSESSMENT:    1. Permanent atrial fibrillation (HCC)   2. Stage 3 chronic kidney disease, unspecified whether stage 3a or 3b CKD ([place])   3. Coronary artery disease involving native coronary artery of native heart without angina pectoris   4. Chronic diastolic congestive heart failure ([place])   5. Essential hypertension   6. Hyperlipidemia LDL goal <70    PLAN:    In order of problems listed above:  Permanent atrial fibrillation: Heart rate very well controlled on current therapy.  On Eliquis  CKD stage III: Obtain basic metabolic panel.  CAD: Denies any chest pain  Chronic diastolic heart failure: Euvolemic on current therapy.  Obtain basic metabolic panel  Hypertension: Blood pressure is elevated today, however his  blood pressure during the previous visit was a lot lower.    Hyperlipidemia: On Lipitor        Medication Adjustments/Labs and Tests Ordered: Current medicines are reviewed at length with the patient today.  Concerns regarding medicines are outlined above.  Orders Placed This Encounter  Procedures   Basic metabolic panel   TSH   EKG 12-Lead   No orders of the defined types were placed in this encounter.   Patient Instructions  Medication Instructions:  Your physician recommends that you continue on your current medications as directed. Please refer to the Current Medication list given to you today.  *If you need a refill on your cardiac medications before your next appointment, please call your pharmacy*  Lab Work: Your physician recommends that you return for lab work TODAY:  BMET TSH  If you have labs (blood work) drawn today and your tests are completely normal, you will receive your results only by: [place] (if you have MyChart) OR A paper copy in the mail If you have any lab test that is abnormal or we need to change your treatment, we will call you to review the results.  Testing/Procedures: NONE ordered at this time of appointment   Follow-Up: At [facility], you and your health needs are our priority.  As part of our continuing mission to provide you  with exceptional heart care, we have created designated Provider Care Teams.  These Care Teams include your primary Cardiologist (physician) and Advanced Practice Providers (APPs -  Physician Assistants and Nurse Practitioners) who all work together to provide you with the care you need, when you need it.  We recommend signing up for the patient portal called "MyChart".  Sign up information is provided on this After Visit Summary.  MyChart is used to connect with patients for Virtual Visits (Telemedicine).  Patients are able to view lab/test results, encounter notes, upcoming appointments, etc.  Non-urgent  messages can be sent to your provider as well.   To learn more about what you can do with MyChart, go to [email or web address].    Your next appointment:   6 month(s)  The format for your next appointment:   In Person  Provider:   Peter Martinique, MD  Other Instructions Monitor weight at home. If weight gain of 3 lbs in day/overnight or 5 lbs in a weeks please give our office a call.   Hilbert Corrigan, [place]  10/11/2020 11:07 PM    [place]

## 2020-10-10 LAB — BASIC METABOLIC PANEL
BUN/Creatinine Ratio: 13 (ref 10–24)
BUN: 18 mg/dL (ref 8–27)
CO2: 28 mmol/L (ref 20–29)
Calcium: 9.7 mg/dL (ref 8.6–10.2)
Chloride: 102 mmol/L (ref 96–106)
Creatinine, Ser: 1.36 mg/dL — ABNORMAL HIGH (ref 0.76–1.27)
Glucose: 81 mg/dL (ref 65–99)
Potassium: 4 mmol/L (ref 3.5–5.2)
Sodium: 144 mmol/L (ref 134–144)
eGFR: 53 mL/min/{1.73_m2} — ABNORMAL LOW (ref >59)

## 2020-10-10 LAB — TSH: TSH: 2.09 u[IU]/mL (ref 0.450–4.500)

## 2020-10-10 NOTE — Progress Notes (Signed)
Normal thyroid level. Stable renal function and electrolyte. Kidney function has improved when compare to 2 month ago

## 2020-10-11 ENCOUNTER — Encounter: Payer: Self-pay | Admitting: Physician Assistant

## 2020-10-16 ENCOUNTER — Other Ambulatory Visit: Payer: Self-pay | Admitting: Internal Medicine

## 2020-10-16 DIAGNOSIS — E1151 Type 2 diabetes mellitus with diabetic peripheral angiopathy without gangrene: Secondary | ICD-10-CM

## 2020-10-22 ENCOUNTER — Other Ambulatory Visit: Payer: Self-pay | Admitting: Internal Medicine

## 2020-10-24 ENCOUNTER — Ambulatory Visit: Payer: Medicare Other

## 2020-11-08 ENCOUNTER — Other Ambulatory Visit: Payer: Self-pay | Admitting: Internal Medicine

## 2020-11-08 DIAGNOSIS — E785 Hyperlipidemia, unspecified: Secondary | ICD-10-CM

## 2020-11-15 ENCOUNTER — Other Ambulatory Visit: Payer: Self-pay | Admitting: Cardiology

## 2020-11-15 DIAGNOSIS — I10 Essential (primary) hypertension: Secondary | ICD-10-CM

## 2020-12-12 ENCOUNTER — Other Ambulatory Visit: Payer: Self-pay | Admitting: Internal Medicine

## 2020-12-12 DIAGNOSIS — E1151 Type 2 diabetes mellitus with diabetic peripheral angiopathy without gangrene: Secondary | ICD-10-CM

## 2020-12-16 ENCOUNTER — Other Ambulatory Visit: Payer: Self-pay | Admitting: Internal Medicine

## 2020-12-16 DIAGNOSIS — I480 Paroxysmal atrial fibrillation: Secondary | ICD-10-CM

## 2020-12-25 ENCOUNTER — Telehealth: Payer: Self-pay | Admitting: Pharmacist

## 2020-12-25 NOTE — Progress Notes (Signed)
Chronic Care Management Pharmacy Assistant   Name: Jason Fisher  MRN: [id number] DOB: 08-05-1940   Reason for Encounter: Disease State Hypertension Assessment Call    Conditions to be addressed/monitored: HTN   Recent office visits:  None   Recent consult visits:  10/09/20 [name], [place] (Cardiology) - Patient presented for Permanent atrial fibrillation and other concerns. No medication changes.   Hospital visits:  None in previous 6 months  Medications: Outpatient Encounter Medications as of 12/25/2020  Medication Sig   ACCU-CHEK AVIVA PLUS test strip CHECK BLOOD SUGAR ONCE DAILY. DX E11.51   atorvastatin (LIPITOR) 40 MG tablet Take 1 tablet (40 mg total) by mouth daily.   BEVESPI AEROSPHERE 9-4.8 MCG/ACT AERO TAKE 2 PUFFS BY MOUTH TWICE A DAY   clopidogrel (PLAVIX) 75 MG tablet TAKE 1 TABLET BY MOUTH EVERY DAY   diltiazem (CARDIZEM CD) 120 MG 24 hr capsule TAKE 1 CAPSULE BY MOUTH EVERY DAY   ELIQUIS 5 MG TABS tablet TAKE 1 TABLET BY MOUTH TWICE A DAY   furosemide (LASIX) 80 MG tablet Take 1 tablet (80 mg total) by mouth daily.   hydrALAZINE (APRESOLINE) 25 MG tablet TAKE 1 TABLET BY MOUTH THREE TIMES A DAY   hydrALAZINE (APRESOLINE) 50 MG tablet TAKE 1 TABLET BY MOUTH THREE TIMES A DAY   levalbuterol (XOPENEX HFA) 45 MCG/ACT inhaler Inhale 1-2 puffs into the lungs every 6 (six) hours as needed for wheezing.   metFORMIN (GLUCOPHAGE) 1000 MG tablet TAKE 1 TABLET TWICE DAILY WITH A MEAL   metoprolol tartrate (LOPRESSOR) 100 MG tablet TAKE 1 TABLET BY MOUTH TWICE A DAY   nitroGLYCERIN (NITROSTAT) 0.4 MG SL tablet Place 1 tablet (0.4 mg total) under the tongue every 5 (five) minutes as needed for chest pain. X 3 doses   polyethylene glycol (MIRALAX / GLYCOLAX) packet Take 17 g by mouth daily as needed for mild constipation.   TRESIBA FLEXTOUCH 100 UNIT/ML FlexTouch Pen INJECT 32 UNITS INTO THE SKIN AT BEDTIME.   No facility-administered encounter medications on file as of  12/25/2020.   Reviewed chart prior to disease state call. Spoke with patient regarding BP  Recent Office Vitals: BP Readings from Last 3 Encounters:  10/09/20 (!) 150/102  09/23/20 140/82  08/20/20 (!) 132/92   Pulse Readings from Last 3 Encounters:  10/09/20 91  08/20/20 (!) 55  07/29/20 84    Wt Readings from Last 3 Encounters:  10/09/20 175 lb 12.8 oz (79.7 kg)  08/20/20 173 lb 9.6 oz (78.7 kg)  07/29/20 173 lb 11.2 oz (78.8 kg)     Kidney Function Lab Results  Component Value Date/Time   CREATININE 1.36 (H) 10/09/2020 11:13 AM   CREATININE 1.61 (H) 08/02/2020 10:33 AM   GFR 54.45 (L) 12/21/2017 10:03 AM   GFRNONAA 59 (L) 05/12/2020 06:38 AM   GFRAA 56 (L) 03/06/2019 02:04 PM    BMP Latest Ref Rng & Units 10/09/2020 08/02/2020 05/12/2020  Glucose 65 - 99 mg/dL 81 139(H) 150(H)  BUN 8 - 27 mg/dL 18 24 22   Creatinine 0.76 - 1.27 mg/dL 1.36(H) 1.61(H) 1.25(H)  BUN/Creat Ratio 10 - 24 13 15  -  Sodium 134 - 144 mmol/L 144 145(H) 139  Potassium 3.5 - 5.2 mmol/L 4.0 4.0 3.6  Chloride 96 - 106 mmol/L 102 101 100  CO2 20 - 29 mmol/L 28 27 29   Calcium 8.6 - 10.2 mg/dL 9.7 10.0 9.1    Current antihypertensive regimen:  Diltiazem 120 mg 1 capsule daily  Hydralazine 25 mg 1 tablet three times daily - morning, noon, 8pm Metoprolol tartrate 100 mg 1 tablet twice daily  Adherence Review: Is the patient currently on ACE/ARB medication? No Does the patient have >5 day gap between last estimated fill dates? No    Care Gaps: BP - 150/102 ( 10/09/20) Eye Exam - Overdue Foot Exam - Overdue Flu vaccine - Overdue AWV- Msg sent to office 8/22 CCM - Unable to reach to schedule  Star Rating Drugs: Metformin (Glucophage) 1000 mg - [facility] Atorvastatin (Lipitor) 40 mg - Last [facility]/08/2020[facility]    Notes: Attempted to reach on 11/23 left msg 2nd attempt to reach on 11/29 3rd attempt to reach 11/30  [place] Pharmacist  Assistant [phone]

## 2021-01-12 ENCOUNTER — Other Ambulatory Visit: Payer: Self-pay | Admitting: Internal Medicine

## 2021-01-12 ENCOUNTER — Other Ambulatory Visit: Payer: Self-pay | Admitting: Emergency Medicine

## 2021-01-12 DIAGNOSIS — I5033 Acute on chronic diastolic (congestive) heart failure: Secondary | ICD-10-CM

## 2021-01-12 DIAGNOSIS — J42 Unspecified chronic bronchitis: Secondary | ICD-10-CM

## 2021-01-16 ENCOUNTER — Telehealth: Payer: Self-pay | Admitting: Pharmacist

## 2021-01-16 NOTE — Chronic Care Management (AMB) (Signed)
Chronic Care Management Pharmacy Assistant   Name: Jason Fisher  MRN: [id number] DOB: [birth year]   Reason for Encounter: Disease State Hypertension Assessment Call    Conditions to be addressed/monitored: HTN  Primary concerns for visit include: Follow up appointment with Pharmacist and PCP for BP  Recent office visits:  None   Recent consult visits:  10/09/20 [name], [place] (Cardiology) - Patient presented for Permanent atrial fibrillation and other concerns. No medication changes.  Hospital visits:  None in previous 6 months  Medications: Outpatient Encounter Medications as of 01/16/2021  Medication Sig   ACCU-CHEK AVIVA PLUS test strip CHECK BLOOD SUGAR ONCE DAILY. DX E11.51   atorvastatin (LIPITOR) 40 MG tablet Take 1 tablet (40 mg total) by mouth daily.   BEVESPI AEROSPHERE 9-4.8 MCG/ACT AERO TAKE 2 PUFFS BY MOUTH TWICE A DAY   clopidogrel (PLAVIX) 75 MG tablet TAKE 1 TABLET BY MOUTH EVERY DAY   diltiazem (CARDIZEM CD) 120 MG 24 hr capsule TAKE 1 CAPSULE BY MOUTH EVERY DAY   ELIQUIS 5 MG TABS tablet TAKE 1 TABLET BY MOUTH TWICE A DAY   furosemide (LASIX) 80 MG tablet TAKE 1 TABLET BY MOUTH EVERY DAY   hydrALAZINE (APRESOLINE) 25 MG tablet TAKE 1 TABLET BY MOUTH THREE TIMES A DAY   hydrALAZINE (APRESOLINE) 50 MG tablet TAKE 1 TABLET BY MOUTH THREE TIMES A DAY   levalbuterol (XOPENEX HFA) 45 MCG/ACT inhaler INHALE 1 TO 2 PUFFS BY MOUTH EVERY 6 HOURS AS NEEDED FOR WHEEZE   metFORMIN (GLUCOPHAGE) 1000 MG tablet TAKE 1 TABLET TWICE DAILY WITH A MEAL   metoprolol tartrate (LOPRESSOR) 100 MG tablet TAKE 1 TABLET BY MOUTH TWICE A DAY   nitroGLYCERIN (NITROSTAT) 0.4 MG SL tablet Place 1 tablet (0.4 mg total) under the tongue every 5 (five) minutes as needed for chest pain. X 3 doses   polyethylene glycol (MIRALAX / GLYCOLAX) packet Take 17 g by mouth daily as needed for mild constipation.   TRESIBA FLEXTOUCH 100 UNIT/ML FlexTouch Pen INJECT 32 UNITS INTO THE SKIN AT BEDTIME.    No facility-administered encounter medications on file as of 01/16/2021.  Reviewed chart prior to disease state call. Spoke with patient regarding BP  Recent Office Vitals: BP Readings from Last 3 Encounters:  10/09/20 (!) 150/102  09/23/20 140/82  08/20/20 (!) 132/92   Pulse Readings from Last 3 Encounters:  10/09/20 91  08/20/20 (!) 55  07/29/20 84    Wt Readings from Last 3 Encounters:  10/09/20 175 lb 12.8 oz (79.7 kg)  08/20/20 173 lb 9.6 oz (78.7 kg)  07/29/20 173 lb 11.2 oz (78.8 kg)     Kidney Function Lab Results  Component Value Date/Time   CREATININE 1.36 (H) 10/09/2020 11:13 AM   CREATININE 1.61 (H) 08/02/2020 10:33 AM   GFR 54.45 (L) 12/21/2017 10:03 AM   GFRNONAA 59 (L) 05/12/2020 06:38 AM   GFRAA 56 (L) 03/06/2019 02:04 PM    BMP Latest Ref Rng & Units 10/09/2020 08/02/2020 05/12/2020  Glucose 65 - 99 mg/dL 81 139(H) 150(H)  BUN 8 - 27 mg/dL 18 24 22   Creatinine 0.76 - 1.27 mg/dL 1.36(H) 1.61(H) 1.25(H)  BUN/Creat Ratio 10 - 24 13 15  -  Sodium 134 - 144 mmol/L 144 145(H) 139  Potassium 3.5 - 5.2 mmol/L 4.0 4.0 3.6  Chloride 96 - 106 mmol/L 102 101 100  CO2 20 - 29 mmol/L 28 27 29   Calcium 8.6 - 10.2 mg/dL 9.7 10.0 9.1  Current antihypertensive regimen:  Diltiazem 120 mg 1 capsule daily  Hydralazine 25 mg 1 tablet three times daily - morning, noon, 8pm Metoprolol tartrate 100 mg 1 tablet twice daily How often are you checking your Blood Pressure? Patient reports he has been checking weekly Current home BP readings: Patient reports his machine does not have a memory and he does not write them down, wife reports they think they have been within normal range, Unable to check at the present moment BP Readings from Last 3 Encounters:  10/09/20 (!) 150/102  09/23/20 140/82  08/20/20 (!) 132/92   What recent interventions/DTPs have been made by any provider to improve Blood Pressure control since last CPP Visit: Patient reports no changes Any recent  hospitalizations or ED visits since last visit with CPP? No Notes: Per Pharmacist offered appointment ASAP for Blood pressures on 01/31/21 1 pm pt accepted.  Adherence Review: Is the patient currently on ACE/ARB medication? No Does the patient have >5 day gap between last estimated fill dates? No     Care Gaps: BP - 150/102 ( 10/09/20) Eye Exam - Overdue Foot Exam - Overdue Flu vaccine - Overdue AWV- Msg sent to office 8/22 CCM -  01/21/21 1pm  Star Rating Drugs: Metformin (Glucophage) 1000 mg - [facility] Atorvastatin (Lipitor) 40 mg - Last filled 11/08/2020- 90 DS at [facility] Pharmacist Assistant [phone]

## 2021-01-30 NOTE — Progress Notes (Signed)
Patient left voice mail reminder of phone call appointment with Clinical Pharmacist on 01/31/21 at 1 pm

## 2021-01-31 ENCOUNTER — Telehealth: Payer: Medicare Other

## 2021-02-04 ENCOUNTER — Ambulatory Visit (INDEPENDENT_AMBULATORY_CARE_PROVIDER_SITE_OTHER): Payer: Medicare Other | Admitting: Pharmacist

## 2021-02-04 DIAGNOSIS — I10 Essential (primary) hypertension: Secondary | ICD-10-CM

## 2021-02-04 DIAGNOSIS — I482 Chronic atrial fibrillation, unspecified: Secondary | ICD-10-CM

## 2021-02-04 NOTE — Progress Notes (Signed)
Chronic Care Management Pharmacy Note  02/10/2021 Name:  Jason Fisher MRN:  [id number] DOB:  September 12, 1940  Summary: BP not at goal < 130/80 per home and office readings A1c not at goal < 7% LDL not at goal < 70  Recommendations/Changes made from today's visit: -Recommended bringing BP cuff to office to compare readings -Transition to [facility] for adherence packaging -Recommend repeat lipid panel and consider increasing atorvastatin to 80 mg daily to lower LDL -Recommend CGM  Plan: Scheduled PCP BP follow up  Misses middle of the day hydralazine    Subjective: Jason Fisher is an 81 y.o. year old male who is a primary patient of [name], [name], MD.  The CCM team was consulted for assistance with disease management and care coordination needs.    Engaged with patient by telephone for follow up visit in response to provider referral for pharmacy case management and/or care coordination services.   Consent to Services:  The patient was given information about Chronic Care Management services, agreed to services, and gave verbal consent prior to initiation of services.  Please see initial visit note for detailed documentation.   Patient Care Team: [name], [name], MD as PCP - General (Internal Medicine) Martinique, Peter M, MD as PCP - Cardiology (Cardiology) Martinique, Peter M, MD as Consulting Physician (Cardiology) Gatha Mayer, MD as Consulting Physician (Gastroenterology) Viona Gilmore, [facility] as Pharmacist (Pharmacist)  Recent office visits: 08-20-2020 [name], [name], MD - Patient presented for Essential hypertension. No medication changes.  Recent consult visits: 10/09/20 [name], [place] (Cardiology) - Patient presented for Permanent atrial fibrillation and other concerns. No medication changes.  07-29-2020 Baglia, Corrina, PA-C (Vasc. Surg) - Patient presented for Abdominal aortic aneurysm (AAA) without rupture ([place]). No medication  changes.   07-29-2020 Serafina Mitchell, MD - Patient presented to [facility] for EVAR Duplex   06-20-2020 Collene Gobble, MD (Pulmonology) - Patient presented for Chronic bronchitis, unspecified chronic bronchitis type. No medication changes.  Hospital visits: Medication Reconciliation was completed by comparing discharge summary, patients EMR and Pharmacy list, and upon discussion with patient.   Patient presented to [facility] on 05-10-2020 due to Acute exacerbation of CHF. Discharge date was 05-12-2020.      New?Medications Started at [facility] Discharge:?? -started  Diltiazem 120 MG 24 hr capsule- Take 1 capsule (120 mg total) by mouth daily.   Medication Changes at Hospital Discharge: -Changed  Eliquis 5 MG Tabs tablet- TAKE 1 TABLET BY MOUTH TWICE A DAY Metformin 1000 MG tablet - TAKE 1 TABLET TWICE DAILY WITH A MEAL   Medications Discontinued at Hospital Discharge: -Stopped  None   Medications that remain the same after Hospital Discharge:??  -All other medications will remain the same.       Objective:  Lab Results  Component Value Date   CREATININE 1.36 (H) 10/09/2020   BUN 18 10/09/2020   GFR 54.45 (L) 12/21/2017   GFRNONAA 59 (L) 05/12/2020   GFRAA 56 (L) 03/06/2019   NA 144 10/09/2020   K 4.0 10/09/2020   CALCIUM 9.7 10/09/2020   CO2 28 10/09/2020   GLUCOSE 81 10/09/2020    Lab Results  Component Value Date/Time   HGBA1C 7.1 (H) 08/02/2020 10:33 AM   HGBA1C 6.4 (A) 05/10/2020 10:37 AM   HGBA1C 7.0 (A) 02/07/2020 10:45 AM   HGBA1C 7.6 (H) 03/06/2019 02:04 PM   GFR 54.45 (L) 12/21/2017 10:03 AM   GFR 61.30  05/24/2017 11:34 AM   MICROALBUR 3.7 (H) 08/30/2015 08:21 AM   MICROALBUR 2.1 (H) 04/22/2015 11:12 AM    Last diabetic Eye exam:  Lab Results  Component Value Date/Time   HMDIABEYEEXA Retinopathy (A) 03/22/2017 12:27 PM    Last diabetic Foot exam:  Lab Results  Component Value Date/Time   HMDIABFOOTEX done 12/10/2011 12:00 AM      Lab Results  Component Value Date   CHOL 141 08/02/2020   HDL 39 (L) 08/02/2020   LDLCALC 82 08/02/2020   TRIG 110 08/02/2020   CHOLHDL 3.6 08/02/2020    Hepatic Function Latest Ref Rng & Units 08/02/2020 05/11/2020 05/10/2020  Total Protein 6.0 - 8.5 g/dL 7.6 7.5 7.1  Albumin 3.7 - 4.7 g/dL 4.5 3.9 3.6  AST 0 - 40 IU/L '17 28 31  ' ALT 0 - 44 IU/L '13 27 26  ' Alk Phosphatase 44 - 121 IU/L 91 88 89  Total Bilirubin 0.0 - 1.2 mg/dL 0.5 1.3(H) 1.1  Bilirubin, Direct 0.00 - 0.40 mg/dL 0.19 - -    Lab Results  Component Value Date/Time   TSH 2.090 10/09/2020 11:13 AM   TSH 0.424 04/22/2016 03:55 AM   TSH 2.47 08/30/2015 08:19 AM   FREET4 1.12 01/14/2010 05:55 AM    CBC Latest Ref Rng & Units 05/11/2020 05/10/2020 03/06/2019  WBC 4.0 - 10.5 K/uL 10.4 9.1 8.7  Hemoglobin 13.0 - 17.0 g/dL 12.9(L) 12.0(L) 14.2  Hematocrit 39.0 - 52.0 % 40.7 37.6(L) 43.7  Platelets 150 - 400 K/uL 194 168 180    No results found for: VD25OH  Clinical ASCVD: Yes  The ASCVD Risk score (Arnett DK, et al., 2019) failed to calculate for the following reasons:   The 2019 ASCVD risk score is only valid for ages 53 to 31   The patient has a prior MI or stroke diagnosis    Depression screen [facility] 2/9 05/24/2020 05/12/2019 05/12/2019  Decreased Interest 0 0 0  Down, Depressed, Hopeless 0 0 0  PHQ - 2 Score 0 0 0  Altered sleeping 0 0 0  Tired, decreased energy 0 0 0  Change in appetite 0 0 0  Feeling bad or failure about yourself  0 0 0  Trouble concentrating 0 0 0  Moving slowly or fidgety/restless 0 0 0  Suicidal thoughts 0 0 0  PHQ-9 Score 0 0 0  Difficult doing work/chores Not difficult at all Not difficult at all -  Some recent data might be hidden     CHA2DS2/VAS Stroke Risk Points  Current as of 2 hours ago     6 >= 2 Points: High Risk  1 - 1.99 Points: Medium Risk  0 Points: Low Risk    Last Change: N/A      Details    This score determines the patient's risk of having a stroke if the  patient has  atrial fibrillation.       Points Metrics  1 Has Congestive Heart Failure:  Yes    Current as of 2 hours ago  1 Has Vascular Disease:  Yes    Current as of 2 hours ago  1 Has Hypertension:  Yes    Current as of 2 hours ago  2 Age:  41    Current as of 2 hours ago  1 Has Diabetes:  Yes    Current as of 2 hours ago  0 Had Stroke:  No  Had TIA:  No  Had Thromboembolism:  No  Current as of 2 hours ago  0 Male:  No    Current as of 2 hours ago          Social History   Tobacco Use  Smoking Status Some Days   Packs/day: 1.00   Years: 49.00   Pack years: 49.00   Types: Cigarettes   Last attempt to quit: 09/08/2015   Years since quitting: 5.4  Smokeless Tobacco Never  Tobacco Comments   passive smoker as well   BP Readings from Last 3 Encounters:  10/09/20 (!) 150/102  09/23/20 140/82  08/20/20 (!) 132/92   Pulse Readings from Last 3 Encounters:  10/09/20 91  08/20/20 (!) 55  07/29/20 84   Wt Readings from Last 3 Encounters:  10/09/20 175 lb 12.8 oz (79.7 kg)  08/20/20 173 lb 9.6 oz (78.7 kg)  07/29/20 173 lb 11.2 oz (78.8 kg)   BMI Readings from Last 3 Encounters:  10/09/20 26.73 kg/m  08/20/20 26.40 kg/m  07/29/20 26.41 kg/m    Assessment/Interventions: Review of patient past medical history, allergies, medications, health status, including review of consultants reports, laboratory and other test data, was performed as part of comprehensive evaluation and provision of chronic care management services.   SDOH:  (Social Determinants of Health) assessments and interventions performed: No   SDOH Screenings   Alcohol Screen: Not on file  Depression (PHQ2-9): Low Risk    PHQ-2 Score: 0  Financial Resource Strain: Medium Risk   Difficulty of Paying Living Expenses: Somewhat hard  Food Insecurity: Not on file  Housing: Not on file  Physical Activity: Not on file  Social Connections: Not on file  Stress: Not on file  Tobacco Use: High Risk   Smoking  Tobacco Use: Some Days   Smokeless Tobacco Use: Never   Passive Exposure: Not on file  Transportation Needs: No Transportation Needs   Lack of Transportation (Medical): No   Lack of Transportation (Non-Medical): No     CCM Care Plan  Allergies  Allergen Reactions   Contrast Media [Iodinated Contrast Media] Anaphylaxis    Medications Reviewed Today     Reviewed by [name], PA (Physician Assistant) on 10/11/20 at 2303  Med List Status: <None>   Medication Order Taking? Sig Documenting Provider Last Dose Status Informant  ACCU-CHEK AVIVA PLUS test strip [id number] Yes CHECK BLOOD SUGAR ONCE DAILY. DX E11.51 [name], [name], MD Taking Active Self  atorvastatin (LIPITOR) 40 MG tablet [id number] Yes TAKE 1 TABLET BY MOUTH EVERY DAY  Patient taking differently: Take 40 mg by mouth daily.   [name], [name], MD Taking Active   BEVESPI AEROSPHERE 9-4.8 MCG/ACT AERO [id number] Yes TAKE 2 PUFFS BY MOUTH TWICE A DAY Collene Gobble, MD Taking Active   clopidogrel (PLAVIX) 75 MG tablet [id number] Yes TAKE 1 TABLET BY MOUTH EVERY DAY  Patient taking differently: Take 75 mg by mouth daily.   [name], [name], MD Taking Active   diltiazem [facility] CD) 120 MG 24 hr capsule [id number] Yes TAKE 1 CAPSULE BY MOUTH EVERY DAY [name], [name], MD Taking Active   ELIQUIS 5 MG TABS tablet [id number] Yes TAKE 1 TABLET BY MOUTH TWICE A DAY Martinique, Peter M, MD Taking Active   furosemide (LASIX) 80 MG tablet [id number] Yes Take 1 tablet (80 mg total) by mouth daily. [name], [name], MD Taking Active Self  hydrALAZINE (APRESOLINE) 25 MG tablet [id number] Yes Take 1 tablet (25 mg total) by mouth 3 (three)  times daily. [name], [name], MD Taking Active   hydrALAZINE (APRESOLINE) 50 MG tablet [id number] Yes TAKE 1 TABLET BY MOUTH THREE TIMES A DAY [name], [name], MD Taking Active   levalbuterol [facility]) 45 MCG/ACT inhaler [id number] Yes  Inhale 1-2 puffs into the lungs every 6 (six) hours as needed for wheezing. Collene Gobble, MD Taking Active Self  metFORMIN (GLUCOPHAGE) 1000 MG tablet [id number] Yes TAKE 1 TABLET TWICE DAILY WITH A MEAL  Patient taking differently: Take 1,000 mg by mouth 2 (two) times daily with a meal. TAKE 1 TABLET TWICE DAILY WITH A MEAL   [name], [name], MD Taking Active   metoprolol tartrate (LOPRESSOR) 100 MG tablet [id number] Yes Take 1 tablet (100 mg total) by mouth 2 (two) times daily. Martinique, Peter M, MD Taking Active Self  nitroGLYCERIN (NITROSTAT) 0.4 MG SL tablet [id number] Yes Place 1 tablet (0.4 mg total) under the tongue every 5 (five) minutes as needed for chest pain. X 3 doses [name], [name], MD Taking Active   polyethylene glycol [facility] / GLYCOLAX) packet [id number] Yes Take 17 g by mouth daily as needed for mild constipation. [provider] Taking Active Self  TRESIBA FLEXTOUCH 100 UNIT/ML FlexTouch Pen [id number] Yes INJECT 32 UNITS INTO THE SKIN AT BEDTIME.  Patient taking differently: Inject 32 Units into the skin at bedtime.   [name], [name], MD Taking Active             Patient Active Problem List   Diagnosis Date Noted   Acute exacerbation of CHF (congestive heart failure) ([place]) 05/11/2020   Hyperglycemia due to diabetes mellitus ([place]) 05/11/2020   Elevated brain natriuretic peptide (BNP) level 05/11/2020   Hypertensive urgency 05/11/2020   Prolonged QT interval 05/11/2020   Elevated troponin 04/22/2016   COPD (chronic obstructive pulmonary disease) ([place]) 02/07/2016   Abdominal aortic aneurysm (AAA) without rupture    Perirectal abscess 11/04/2015   Pulmonary nodules 11/04/2015   Coronary artery disease of bypass graft of native heart with stable angina pectoris ([place])    Allergic reaction to contrast dye 09/27/2015   Accelerated hypertension with diastolic congestive heart failure, NYHA class 3 ([place]) 09/27/2015   CHF (congestive  heart failure) ([place]) 09/25/2015   CKD (chronic kidney disease), stage III (HCC)    Acute on chronic combined systolic and diastolic congestive heart failure (HCC)    Diastolic congestive heart failure (HCC)    Paroxysmal atrial fibrillation ([place])    Angiodysplasia of colon 04/19/2015   TOBACCO USER 04/17/2010   Chronic atrial fibrillation ([place]): CHA2DS2Vasc = 5. On Warfarin. Rate control 11/06/2009   Occlusion and stenosis of carotid artery without mention of cerebral infarction 07/13/2008   Abdominal aortic aneurysm ([place]) 07/13/2008   BENIGN PROSTATIC HYPERTROPHY, HX OF 02/24/2007   MYOCARDIAL INFARCTION, HX OF 11/02/2006   CAD S/P percutaneous coronary angioplasty 11/02/2006   History of colonic polyps 11/02/2006   Dyslipidemia 10/29/2006   Essential hypertension 10/29/2006    Immunization History  Administered Date(s) Administered   Fluad Quad(high Dose 65+) 02/07/2020   Influenza, High Dose Seasonal PF 01/21/2015, 02/05/2016, 11/23/2016, 10/28/2017, 01/17/2021   Influenza,inj,Quad PF,6+ Mos 10/31/2013   Influenza-Unspecified 11/23/2016, 12/25/2018   PFIZER(Purple Top)SARS-COV-2 Vaccination 06/01/2019, 07/02/2019   Pneumococcal Conjugate-13 01/09/2013   Pneumococcal Polysaccharide-23 03/07/2014   Tetanus 07/31/2013   Patient reports he thinks he is doing better in terms of compliance but reports that he feels tired really fast. He reports this has been going  on a while and he feels like none of his providers listen to him when he mentions this. He also stopped taking furosemide for a while because of frequent bathroom trips but has since restarted because he noticed more shortness of breath.   Conditions to be addressed/monitored:  Hypertension, Hyperlipidemia, Diabetes, Atrial Fibrillation, Heart Failure, Coronary Artery Disease, COPD, and Tobacco use  Conditions addressed this visit: Diabetes, hypertension  Care Plan : CCM Pharmacy Care Plan  Updates made by Viona Gilmore, [place] since 02/10/2021 12:00 AM     Problem: Problem: Hypertension, Hyperlipidemia, Diabetes, Atrial Fibrillation, Heart Failure, Coronary Artery Disease, COPD, and Tobacco use      Long-Range Goal: Patient-Specific Goal   Start Date: 09/23/2020  Expected End Date: 09/23/2021  Recent Progress: On track  Priority: High  Note:   Current Barriers:  Unable to independently afford treatment regimen Unable to achieve control of blood pressure  Unable to maintain control of cholesterol and diabetes Unable to self administer medications as prescribed  Pharmacist Clinical Goal(s):  Patient will verbalize ability to afford treatment regimen achieve adherence to monitoring guidelines and medication adherence to achieve therapeutic efficacy achieve control of blood pressure as evidenced by home BP readings maintain control of cholesterol and diabetes as evidenced by lipid panel and A1c  through collaboration with PharmD and provider.   Interventions: 1:1 collaboration with [name], [name], MD regarding development and update of comprehensive plan of care as evidenced by provider attestation and co-signature Inter-disciplinary care team collaboration (see longitudinal plan of care) Comprehensive medication review performed; medication list updated in electronic medical record  Hypertension (BP goal <130/80) -Uncontrolled -Current treatment: Diltiazem 120 mg 1 capsule daily - appropriate, query effective Hydralazine 50 mg 1 tablet three times daily - morning, noon, 8pm - appropriate, query effective Metoprolol tartrate 100 mg 1 tablet twice daily - appropriate, query effective -Medications previously tried: unknown  -Current home readings: 165/86, 116/67, 170/89, 148/92, 156/87, 170/82, 156/84, 159/87, 162/92, 137/95, 150/93, 178/87, 147/92, 137/87, 146/105 (checking daily with wrist cuff) -Current dietary habits: doesn't season with much salt; rinsing canned vegetables; not eating out  often; recommended reading package labels -Current exercise habits: no structured exercise (out of breath walking to the mailbox) -Denies hypotensive/hypertensive symptoms -Educated on BP goals and benefits of medications for prevention of heart attack, stroke and kidney damage; Daily salt intake goal < 2300 mg; Exercise goal of 150 minutes per week; Importance of home blood pressure monitoring; Proper BP monitoring technique; -Counseled to monitor BP at home daily, document, and provide log at future appointments -Counseled on diet and exercise extensively Recommended to continue current medication Scheduled follow up with PCP for BP management.  Hyperlipidemia: (LDL goal < 70) -Uncontrolled -Current treatment: Atorvastatin 40 mg 1 tablet daily - appropriate, query effective -Medications previously tried: none  -Current dietary patterns: doesn't fry foods often; uses vegetable or olive oil -Current exercise habits: no structured exercise  -Educated on Cholesterol goals;  Benefits of statin for ASCVD risk reduction; Importance of limiting foods high in cholesterol; Exercise goal of 150 minutes per week; -Counseled on diet and exercise extensively Recommended to continue current medication  CAD (Goal: prevent heart events) -Controlled -Current treatment  Atorvastatin 40 mg 1 tablet daily Nitroglycerin 0.4 mg SL tablet as needed Clopidogrel 75 mg 1 tablet daily Metoprolol tartrate 100 mg 1 tablet twice daily -Medications previously tried: none  -Recommended to continue current medication Counseled on importance of checking expiration date of nitroglycerin. Requested a refill  for patient as his is expired.    Diabetes (A1c goal <7%) -Not ideally controlled -Current medications: Tresiba 100 units/mL inject 32 units at bedtime Metformin 1000 mg 1 tablet twice daily at meals -Medications previously tried: unknown  -Current home glucose readings fasting glucose: not checking  consistently post prandial glucose: patient stopped checking at home -Denies hypoglycemic/hyperglycemic symptoms -Current meal patterns:  breakfast: eggs and bacon or sausage OR biscuit and cheese  lunch: none  dinner: nuggets and mashed potatoes snacks: moonpie drinks: water, sweet tea, mt dew, coffee in AM -Current exercise: no structured exercise -Educated on A1c and blood sugar goals; Exercise goal of 150 minutes per week; Benefits of routine self-monitoring of blood sugar; Continuous glucose monitoring; Carbohydrate counting and/or plate method -Counseled to check feet daily and get yearly eye exams -Counseled on diet and exercise extensively Recommended to continue current medication Recommended for patient to start checking blood sugars daily and CGM use.  Atrial Fibrillation (Goal: prevent stroke and major bleeding) -Controlled -CHADSVASC: 6 -Current treatment: Rate control: Diltiazem 120 mg 1 capsule daily; Metoprolol tartrate 100 mg 1 tablet twice daily Anticoagulation: Eliquis 5 mg 1 tablet twice daily -Medications previously tried: none -Home BP and HR readings: could not provide  -Counseled on increased risk of stroke due to Afib and benefits of anticoagulation for stroke prevention; importance of adherence to anticoagulant exactly as prescribed; avoidance of NSAIDs due to increased bleeding risk with anticoagulants; -Counseled on diet and exercise extensively Recommended to continue current medication  Heart Failure (Goal: manage symptoms and prevent exacerbations) -Controlled -Last ejection fraction: 50-55% (Date: 05/11/20) -HF type: Diastolic -NYHA Class: III (marked limitation of activity) -AHA HF Stage: C (Heart disease and symptoms present) -Current treatment: Furosemide 80 mg 1 tablet daily Metoprolol tartrate 100 mg 1 tablet twice daily -Medications previously tried: none  -Current home BP/HR readings: refer to above -Current dietary habits: limits use  of salt shaker; does not read nutrition labels for sodium content -Current exercise habits: no structured exercise -Educated on Benefits of medications for managing symptoms and prolonging life Importance of weighing daily; if you gain more than 3 pounds in one day or 5 pounds in one week, call cardiologist. -Counseled on diet and exercise extensively Recommended to continue current medication  COPD (Goal: control symptoms and prevent exacerbations) -Not ideally controlled -Current treatment  Levalbuterol 45 mcg/act 1-2 puffs every 4-6 hours as needed Bevespi aerosphere 9-4.8 mcg/act 2 puffs twice daily -Medications previously tried: unknown  -Gold Grade: Gold 3 (FEV1 30-49%) -Current COPD Classification:  B (high sx, <2 exacerbations/yr) -MMRC/CAT score: n/a -Pulmonary function testing: 2017 -Exacerbations requiring treatment in last 6 months: none -Patient reports consistent use of maintenance inhaler -Frequency of rescue inhaler use: once daily or more; every time he uses steps -Counseled on Proper inhaler technique; Benefits of consistent maintenance inhaler use When to use rescue inhaler Differences between maintenance and rescue inhalers - Consider triple inhaler therapy given current tobacco smoker and using rescue inhaler once a day or more.  Tobacco use (Goal quit smoking) -Uncontrolled -Previous quit attempts: none -Current treatment  No medications -Patient smokes After 30 minutes of waking -Patient triggers include:  enjoyment -On a scale of 1-10, reports MOTIVATION to quit is 9 -On a scale of 1-10, reports CONFIDENCE in quitting is 9 -Counseled on park & chew method for NRT gum. -Counseled on benefits of NRT or slowly backing down on use of cigarettes as breathing will improve with complete cessation.  Health Maintenance -Vaccine gaps:  influenza, shingrix -Current therapy:  Miralax as needed -Educated on Cost vs benefit of each product must be carefully weighed  by individual consumer -Patient is satisfied with current therapy and denies issues -Recommended to continue current medication  Patient Goals/Self-Care Activities Patient will:  - take medications as prescribed focus on medication adherence by setting alarms to remember to take medications check glucose daily, document, and provide at future appointments check blood pressure daily, document, and provide at future appointments target a minimum of 150 minutes of moderate intensity exercise weekly  Follow Up Plan: Telephone follow up appointment with care management team member scheduled for: 3 months        Medication Assistance: Application for Laural Golden and Eliquis  medication assistance program. in process.  Anticipated assistance start date 10/24/21.  See plan of care for additional detail.  Compliance/Adherence/Medication fill history: Care Gaps: Eye exam, foot exam, influenza vaccine BP - 150/102 ( 10/09/20)  Star-Rating Drugs: Metformin (Glucophage) 1000 mg - [facility] Atorvastatin (Lipitor) 40 mg - Last filled 11/08/2020- [facility]  Patient's preferred pharmacy is:  [id number] [place] [place] - [address] [place] [id number]: [phone]: [phone]  Uses pill box? Yes - fills it up one week at a time Pt endorses 90% compliance  We discussed: Benefits of medication synchronization, packaging and delivery as well as enhanced pharmacist oversight with [facility]. Patient decided to: Utilize [facility] pharmacy for medication synchronization, packaging and delivery  Care Plan and Follow Up Patient Decision:  Patient agrees to Care Plan and Follow-up.  Plan: The care management team will reach out to the patient again over the next 30 days.  MJeni Salles PharmD, BKilnPharmacist LMiamiat BElida

## 2021-02-05 ENCOUNTER — Telehealth: Payer: Self-pay | Admitting: Internal Medicine

## 2021-02-05 NOTE — Telephone Encounter (Signed)
Pt wife call and stated she would like a call back at [phone].

## 2021-02-06 NOTE — Telephone Encounter (Signed)
Spoke with patient's wife. Plan for patient to bring the counted out medications to his PCP appt on Monday to start the process to get into adherence packaging.

## 2021-02-10 ENCOUNTER — Telehealth: Payer: Self-pay | Admitting: Emergency Medicine

## 2021-02-10 ENCOUNTER — Telehealth: Payer: Self-pay | Admitting: Pharmacist

## 2021-02-10 ENCOUNTER — Telehealth: Payer: Self-pay | Admitting: Cardiology

## 2021-02-10 ENCOUNTER — Ambulatory Visit (INDEPENDENT_AMBULATORY_CARE_PROVIDER_SITE_OTHER): Payer: Medicare Other | Admitting: Internal Medicine

## 2021-02-10 VITALS — BP 150/100 | HR 110 | Temp 97.7°F | Ht 68.0 in | Wt 169.2 lb

## 2021-02-10 DIAGNOSIS — Z9861 Coronary angioplasty status: Secondary | ICD-10-CM | POA: Diagnosis not present

## 2021-02-10 DIAGNOSIS — I2510 Atherosclerotic heart disease of native coronary artery without angina pectoris: Secondary | ICD-10-CM

## 2021-02-10 DIAGNOSIS — J42 Unspecified chronic bronchitis: Secondary | ICD-10-CM | POA: Diagnosis not present

## 2021-02-10 DIAGNOSIS — I5030 Unspecified diastolic (congestive) heart failure: Secondary | ICD-10-CM

## 2021-02-10 DIAGNOSIS — I480 Paroxysmal atrial fibrillation: Secondary | ICD-10-CM

## 2021-02-10 DIAGNOSIS — I10 Essential (primary) hypertension: Secondary | ICD-10-CM

## 2021-02-10 DIAGNOSIS — I110 Hypertensive heart disease with heart failure: Secondary | ICD-10-CM | POA: Diagnosis not present

## 2021-02-10 DIAGNOSIS — E1151 Type 2 diabetes mellitus with diabetic peripheral angiopathy without gangrene: Secondary | ICD-10-CM | POA: Diagnosis not present

## 2021-02-10 DIAGNOSIS — E785 Hyperlipidemia, unspecified: Secondary | ICD-10-CM | POA: Diagnosis not present

## 2021-02-10 DIAGNOSIS — N1831 Chronic kidney disease, stage 3a: Secondary | ICD-10-CM | POA: Diagnosis not present

## 2021-02-10 LAB — HEMOGLOBIN A1C: Hgb A1c MFr Bld: 6.7 % — ABNORMAL HIGH (ref 4.6–6.5)

## 2021-02-10 LAB — LIPID PANEL
Cholesterol: 122 mg/dL (ref 0–200)
HDL: 36.7 mg/dL — ABNORMAL LOW (ref >39.00)
LDL Cholesterol: 66 mg/dL (ref 0–99)
NonHDL: 84.94
Total CHOL/HDL Ratio: 3
Triglycerides: 94 mg/dL (ref 0.0–149.0)
VLDL: 18.8 mg/dL (ref 0.0–40.0)

## 2021-02-10 LAB — COMPREHENSIVE METABOLIC PANEL
ALT: 11 U/L (ref 0–53)
AST: 15 U/L (ref 0–37)
Albumin: 3.9 g/dL (ref 3.5–5.2)
Alkaline Phosphatase: 75 U/L (ref 39–117)
BUN: 23 mg/dL (ref 6–23)
CO2: 33 mEq/L — ABNORMAL HIGH (ref 19–32)
Calcium: 9.8 mg/dL (ref 8.4–10.5)
Chloride: 103 mEq/L (ref 96–112)
Creatinine, Ser: 1.43 mg/dL (ref 0.40–1.50)
GFR: 46.36 mL/min — ABNORMAL LOW (ref >60.00)
Glucose, Bld: 96 mg/dL (ref 70–99)
Potassium: 3.6 mEq/L (ref 3.5–5.1)
Sodium: 143 mEq/L (ref 135–145)
Total Bilirubin: 0.7 mg/dL (ref 0.2–1.2)
Total Protein: 7.9 g/dL (ref 6.0–8.3)

## 2021-02-10 MED ORDER — DEXCOM G6 TRANSMITTER MISC: 0 refills | Status: DC

## 2021-02-10 MED ORDER — DEXCOM G6 SENSOR MISC: 2 refills | Status: DC

## 2021-02-10 MED ORDER — DEXCOM G6 RECEIVER DEVI: 0 refills | Status: DC

## 2021-02-10 NOTE — Telephone Encounter (Signed)
error 

## 2021-02-10 NOTE — Chronic Care Management (AMB) (Addendum)
Chronic Care Management    Outreach Note    Referred by: [name], [name], MD  Reason for referral: Chronic care management  Reviewed chart for medication changes ahead of medication coordination call.  BP Readings from Last 3 Encounters:  02/10/21 (!) 150/100  10/09/20 (!) 150/102  09/23/20 140/82    Lab Results  Component Value Date   HGBA1C 7.1 (H) 08/02/2020     Verbal consent obtained for [facility] enhanced pharmacy services (medication synchronization, adherence packaging, delivery coordination). A medication sync plan was created to allow patient to get all medications delivered once every 30 to 90 days per patient preference. Patient understands they have freedom to choose pharmacy and clinical pharmacist will coordinate care between all prescribers and [facility].  Patient requested to obtain medications through Adherence Packaging  30 Days   Med sync plan: Metformin 1000 mg x    1   1  170 tabs April 30, 2021  Hydralazine 50 mg x    1  1 1   218 tabs April 17, 2021  Eliquis 5 mg  Peter Martinique February 10, 2021  1   1  30  tabs February 19, 2021  Furosemide 80 mg x    1     90 tabs May 05, 2021  Metoprololol 100 mg Peter Martinique February 10, 2021  1   1  50 tabs March 01, 2021  Clopidogrel 75 mg x    1     50 tabs March 26, 2021  Atorvastatin 40 mg x    1     100 tabs May 15, 2021  Dlitiazem 120 mg x    1     30 tabs March 06, 2021  [name] Flextouch 100U x         Pt will call when needs  Bevespi 2 inhalers  Baltazar Apo MD February 10, 2021       Pt will call when needs       Prescriptions requested from specialists as follows: Requested on 02/10/21 ELIQUIS 5 MG TABS  AND metoprolol tartrate (LOPRESSOR) 100 MG  Martinique, Peter M, MD  [place], [place] [address]  Phone:  [phone]   Fax:  [phone]    BEVESPI AEROSPHERE 9-4.8 [place], [place], [place], [place] [address]  Phone:   [phone]   Fax:  [phone]        Medications: Outpatient Encounter Medications as of 02/10/2021  Medication Sig   ACCU-CHEK AVIVA PLUS test strip CHECK BLOOD SUGAR ONCE DAILY. DX E11.51   atorvastatin (LIPITOR) 40 MG tablet Take 1 tablet (40 mg total) by mouth daily.   BEVESPI AEROSPHERE 9-4.8 MCG/ACT AERO TAKE 2 PUFFS BY MOUTH TWICE A DAY   clopidogrel (PLAVIX) 75 MG tablet TAKE 1 TABLET BY MOUTH EVERY DAY   Continuous Blood Gluc Receiver (DEXCOM G6 RECEIVER) DEVI Check CBGs as directed   Continuous Blood Gluc Sensor (DEXCOM G6 SENSOR) MISC Apply one sensor every 10 days.   Continuous Blood Gluc Transmit (DEXCOM G6 TRANSMITTER) MISC Apply to sensor every 90 days   diltiazem (CARDIZEM CD) 120 MG 24 hr capsule TAKE 1 CAPSULE BY MOUTH EVERY DAY   ELIQUIS 5 MG TABS tablet TAKE 1 TABLET BY MOUTH TWICE A DAY   furosemide (LASIX) 80 MG tablet TAKE 1 TABLET BY MOUTH EVERY DAY   hydrALAZINE (APRESOLINE) 50 MG tablet TAKE 1 TABLET BY MOUTH THREE TIMES A  DAY   levalbuterol (XOPENEX HFA) 45 MCG/ACT inhaler INHALE 1 TO 2 PUFFS BY MOUTH EVERY 6 HOURS AS NEEDED FOR WHEEZE   metFORMIN (GLUCOPHAGE) 1000 MG tablet TAKE 1 TABLET TWICE DAILY WITH A MEAL   metoprolol tartrate (LOPRESSOR) 100 MG tablet TAKE 1 TABLET BY MOUTH TWICE A DAY   nitroGLYCERIN (NITROSTAT) 0.4 MG SL tablet Place 1 tablet (0.4 mg total) under the tongue every 5 (five) minutes as needed for chest pain. X 3 doses   polyethylene glycol (MIRALAX / GLYCOLAX) packet Take 17 g by mouth daily as needed for mild constipation.   TRESIBA FLEXTOUCH 100 UNIT/ML FlexTouch Pen INJECT 32 UNITS INTO THE SKIN AT BEDTIME.   No facility-administered encounter medications on file as of 02/10/2021.    Care Gaps: BP - 150/100 ( 02/10/21) Eye Exam - Overdue Foot Exam - Overdue Flu vaccine - Overdue AWV- Msg sent to office 8/22 [facility] Drugs: Metformin (Glucophage) 1000 mg - Last filled 12/12/2020 90 DS [facility] Atorvastatin (Lipitor) 40 mg -  Last filled 11/08/2020- 90 DS at [facility] Pharmacist Assistant [phone]

## 2021-02-10 NOTE — Progress Notes (Signed)
Established Patient Office Visit     This visit occurred during the SARS-CoV-2 public health emergency.  Safety protocols were in place, including screening questions prior to the visit, additional usage of staff PPE, and extensive cleaning of exam room while observing appropriate contact time as indicated for disinfecting solutions.    CC/Reason for Visit: Follow-up chronic medical conditions  HPI: Jason Fisher is a 81 y.o. male who is coming in today for the above mentioned reasons. Past Medical History is significant for: Hypertension, hyperlipidemia, AAA, COPD, A. fib, stage III chronic kidney disease, heart failure with preserved ejection fraction, type 2 diabetes, coronary artery disease.  He follows routinely with cardiology.  He has a known history of medication nonadherence, suspected due to cognitive deficiencies.  He is overdue for an annual physical.  I have involved our chronic care management team, and pharmacy is in the process of pill packaging his medication.  They are following with him routinely for medication adherence.  2 in office blood pressures are 150/100 and 130/90.  He has no acute concerns or complaints today.  His wife is present.   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 2007 Inf STEMI s/p Vision BMS  2 to RCA;  b. 2009 ISR Prox RCA Stent-->with DES;  c. 09/2015 PCI: LM nl, LAD 30p/m, D1 90, LCX nl, OM1 small, 80, OM2 90 (2.5x14 Biofreedom stent), OM3 small, RCA 30p ISR, 51m ISR, 75m, 85d, EF 50-55%.   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 On Eliquis (CHA2DS2VASc = 5).  Rate controlled w/ BB and CCB.   Chronic diastolic CHF (congestive heart failure) ([place])    a. 09/2015 Echo: EF 55-60%, mildly dil LA.   CKD (chronic kidney disease), stage III ([place])    they mentioned  this to the patient but they would work on it later after the aneurysm   COLONIC POLYPS, HX OF 11/02/2006   COPD (chronic obstructive pulmonary disease) ([place])    DIABETES MELLITUS, TYPE II 10/29/2006   Diverticulosis    History of tobacco abuse    a. Quit 09/2015.   HYPERLIPIDEMIA 10/29/2006   Hypertension    Hypertensive heart disease with heart failure ([place])    Pneumonia 09/2015   [name] 07/31/2016   STEMI (ST elevation myocardial infarction) [facility]) 2007   [name] 07/31/2016    Past Surgical History:  Procedure Laterality Date   ABDOMINAL AORTIC ENDOVASCULAR STENT GRAFT N/A 07/31/2016   Procedure: ABDOMINAL AORTIC ENDOVASCULAR STENT GRAFT;  Surgeon: Serafina Mitchell, MD;  Location: [facility];  Service: Vascular;  Laterality: N/A;   CARDIAC CATHETERIZATION N/A 09/26/2015   Procedure: Left Heart Cath and Coronary Angiography;  Surgeon: Peter M Martinique, MD;  Location: [facility];  Service: Cardiovascular;  Laterality: N/A;   CARDIAC CATHETERIZATION N/A 09/26/2015   Procedure: Coronary Stent Intervention;  Surgeon: Peter M Martinique, MD;  Location: [facility];  Service: Cardiovascular;  Laterality: N/A;   CATARACT EXTRACTION Bilateral    COLONOSCOPY     CORONARY ANGIOPLASTY WITH STENT PLACEMENT  2007   Inferior STEMI 2007 - RCA PCI with 2 Vision BMS; Abnormal Myoview in 2009 - pRCA ins-stent restenosis & unstented disease - DES PCI Promus 3.0 x 20 mm (3.5 mm)   ENDOVASCULAR REPAIR/STENT GRAFT  07/31/2016   INCISION AND DRAINAGE PERIRECTAL ABSCESS N/A 11/06/2015   Procedure: IRRIGATION AND DEBRIDEMENT PERIRECTAL ABSCESS, POSSIBLE HEMORRHOIDECTOMY;  Surgeon: Coralie Keens, MD;  Location: [facility];  Service: General;  Laterality: N/A;   ORIF SHOULDER FRACTURE Right    "fell out of back end of a truck"    Social History:  reports that he has been smoking cigarettes. He has a 49.00 pack-year smoking history. He has never used smokeless tobacco. He reports that he does not drink alcohol and  does not use drugs.  Allergies: Allergies  Allergen Reactions   Contrast Media [Iodinated Contrast Media] Anaphylaxis    Family History:  Family History  Problem Relation Age of Onset   Heart attack Mother    Diabetes Mother    Heart attack Father    Hypertension Father    Heart attack Brother    Diabetes Brother      Current Outpatient Medications:    ACCU-CHEK AVIVA PLUS test strip, CHECK BLOOD SUGAR ONCE DAILY. DX E11.51, Disp: 100 strip, Rfl: 2   atorvastatin (LIPITOR) 40 MG tablet, Take 1 tablet (40 mg total) by mouth daily., Disp: 90 tablet, Rfl: 1   BEVESPI AEROSPHERE 9-4.8 MCG/ACT AERO, TAKE 2 PUFFS BY MOUTH TWICE A DAY, Disp: 10.7 each, Rfl: 5   clopidogrel (PLAVIX) 75 MG tablet, TAKE 1 TABLET BY MOUTH EVERY DAY, Disp: 90 tablet, Rfl: 1   Continuous Blood Gluc Receiver (DEXCOM G6 RECEIVER) DEVI, Check CBGs as directed, Disp: 1 each, Rfl: 0   Continuous Blood Gluc Sensor (DEXCOM G6 SENSOR) MISC, Apply one sensor every 10 days., Disp: 3 each, Rfl: 2   Continuous Blood Gluc Transmit (DEXCOM G6 TRANSMITTER) MISC, Apply to sensor every 90 days, Disp: 1 each, Rfl: 0   diltiazem (CARDIZEM CD) 120 MG 24 hr capsule, TAKE 1 CAPSULE BY MOUTH EVERY DAY, Disp: 90 capsule, Rfl: 1   ELIQUIS 5 MG TABS tablet, TAKE 1 TABLET BY MOUTH TWICE A DAY, Disp: 180 tablet, Rfl: 1   furosemide (LASIX) 80 MG tablet, TAKE 1 TABLET BY MOUTH EVERY DAY, Disp: 90 tablet, Rfl: 1   hydrALAZINE (APRESOLINE) 50 MG tablet, TAKE 1 TABLET BY MOUTH THREE TIMES A DAY, Disp: 270 tablet, Rfl: 1   levalbuterol (XOPENEX HFA) 45 MCG/ACT inhaler, INHALE 1 TO 2 PUFFS BY MOUTH EVERY 6 HOURS AS NEEDED FOR WHEEZE, Disp: 15 each, Rfl: 3   metFORMIN (GLUCOPHAGE) 1000 MG tablet, TAKE 1 TABLET TWICE DAILY WITH A MEAL, Disp: 180 tablet, Rfl: 1   metoprolol tartrate (LOPRESSOR) 100 MG tablet, TAKE 1 TABLET BY MOUTH TWICE A DAY, Disp: 180 tablet, Rfl: 3   nitroGLYCERIN (NITROSTAT) 0.4 MG SL tablet, Place 1 tablet (0.4 mg total)  under the tongue every 5 (five) minutes as needed for chest pain. X 3 doses, Disp: 25 tablet, Rfl: 4   polyethylene glycol (MIRALAX / GLYCOLAX) packet, Take 17 g by mouth daily as needed for mild constipation., Disp: , Rfl:    TRESIBA FLEXTOUCH 100 UNIT/ML FlexTouch Pen, INJECT 32 UNITS INTO THE SKIN AT BEDTIME., Disp: 15 mL, Rfl: 2  Review of Systems:  Constitutional: Denies fever, chills, diaphoresis, appetite change and fatigue.  HEENT: Denies photophobia, eye pain, redness, hearing loss, ear pain, congestion, sore throat, rhinorrhea, sneezing, mouth sores, trouble swallowing, neck pain, neck stiffness and tinnitus.   Respiratory: Denies SOB, DOE, cough, chest tightness,  and wheezing.   Cardiovascular: Denies chest pain, palpitations and leg swelling.  Gastrointestinal: Denies nausea, vomiting, abdominal pain, diarrhea,  constipation, blood in stool and abdominal distention.  Genitourinary: Denies dysuria, urgency, frequency, hematuria, flank pain and difficulty urinating.  Endocrine: Denies: hot or cold intolerance, sweats, changes in hair or nails, polyuria, polydipsia. Musculoskeletal: Denies myalgias, back pain, joint swelling, arthralgias and gait problem.  Skin: Denies pallor, rash and wound.  Neurological: Denies dizziness, seizures, syncope, weakness, light-headedness, numbness and headaches.  Hematological: Denies adenopathy. Easy bruising, personal or family bleeding history  Psychiatric/Behavioral: Denies suicidal ideation, mood changes, confusion, nervousness, sleep disturbance and agitation    Physical Exam: Vitals:   02/10/21 0127 02/10/21 1330  BP: 130/90 (!) 150/100  Pulse:  (!) 110  Temp: 97.7 F (36.5 C)   TempSrc: Oral   SpO2:  96%  Weight: 169 lb 3.2 oz (76.7 kg)   Height: 5\' 8"  (1.727 m)     Body mass index is 25.73 kg/m.   Constitutional: NAD, calm, comfortable Eyes: PERRL, lids and conjunctivae normal, wears corrective lenses ENMT: Mucous membranes are  moist.  Respiratory: clear to auscultation bilaterally, no wheezing, no crackles. Normal respiratory effort. No accessory muscle use.  Cardiovascular: Regular rate and rhythm, no murmurs / rubs / gallops. No extremity edema.   Impression and Plan:  Dyslipidemia  - Plan: Comprehensive metabolic panel, Lipid panel -Check lipids today. -At last check LDL was not at goal at 82.  He is currently on atorvastatin 40 mg daily, questionable adherence.  Essential hypertension -Blood pressure is not well controlled, however he is known to be medically nonadherent. -Pharmacist has seen patient with me today.  Pill count was performed, medications will be pill packed.  He will return for follow-up in 3 months.  If at that time blood pressure remains elevated despite being assured of medication adherence, will need to escalate antihypertensive regimen.  Stage 3a chronic kidney disease (HCC)  Paroxysmal atrial fibrillation (HCC)  CAD S/P percutaneous coronary angioplasty  Chronic bronchitis, unspecified chronic bronchitis type (HCC)  Accelerated hypertension with diastolic congestive heart failure, NYHA class 3 (HCC)  DM (diabetes mellitus), type 2 with peripheral vascular complications ([place])  - Plan: Continuous Blood Gluc Sensor (DEXCOM G6 SENSOR) MISC, Continuous Blood Gluc Transmit (DEXCOM G6 TRANSMITTER) MISC, Continuous Blood Gluc Receiver (DEXCOM G6 RECEIVER) DEVI, Hemoglobin A1c, CANCELED: Hemoglobin A1c -Continuous glucose monitoring ordered, check A1c today.  Time spent: 32 minutes reviewing chart, interviewing and examining patient and formulating plan of care.   Patient Instructions  -Nice seeing you today!!  -Lab work today; will notify you once results are available.  -Schedule follow up in 3 months.    Lelon Frohlich, MD [facility]

## 2021-02-10 NOTE — Patient Instructions (Signed)
-  Nice seeing you today!!  -Lab work today; will notify you once results are available.  -Schedule follow up in 3 months. 

## 2021-02-10 NOTE — Telephone Encounter (Signed)
Pt c/o medication issue:  1. Name of Medication: ELIQUIS 5 MG TABS tablet; metoprolol tartrate (LOPRESSOR) 100 MG tablet  2. How are you currently taking this medication (dosage and times per day)? As written  3. Are you having a reaction (difficulty breathing--STAT)? No   4. What is your medication issue? Patient needs a 30 day prescription for both sent to  [facility], [place] - [address]

## 2021-02-11 MED ORDER — METOPROLOL TARTRATE 100 MG PO TABS: 100.0000 mg | ORAL_TABLET | Freq: Two times a day (BID) | ORAL | 3 refills | Status: DC

## 2021-02-11 MED ORDER — APIXABAN 5 MG PO TABS: 5.0000 mg | ORAL_TABLET | Freq: Two times a day (BID) | ORAL | 1 refills | Status: DC

## 2021-02-11 MED ORDER — BEVESPI AEROSPHERE 9-4.8 MCG/ACT IN AERO: INHALATION_SPRAY | RESPIRATORY_TRACT | 5 refills | Status: DC

## 2021-02-11 NOTE — Telephone Encounter (Signed)
Prescription refill request for Eliquis received. Indication: Afib  Last office visit:10/09/20 [facility] Post)  Scr: 1.36 (10/09/20)  Age: 81 Weight: 76.7kg  Appropriate dose and refill sent to requested pharmacy.

## 2021-02-11 NOTE — Telephone Encounter (Signed)
Pt's Bevespi has been sent to preferred pharmacy. Nothing further needed.

## 2021-02-12 ENCOUNTER — Telehealth: Payer: Self-pay

## 2021-02-12 DIAGNOSIS — E785 Hyperlipidemia, unspecified: Secondary | ICD-10-CM

## 2021-02-12 DIAGNOSIS — I480 Paroxysmal atrial fibrillation: Secondary | ICD-10-CM

## 2021-02-12 DIAGNOSIS — I10 Essential (primary) hypertension: Secondary | ICD-10-CM

## 2021-02-12 DIAGNOSIS — I5033 Acute on chronic diastolic (congestive) heart failure: Secondary | ICD-10-CM

## 2021-02-12 DIAGNOSIS — E1151 Type 2 diabetes mellitus with diabetic peripheral angiopathy without gangrene: Secondary | ICD-10-CM

## 2021-02-12 MED ORDER — FUROSEMIDE 80 MG PO TABS: 80.0000 mg | ORAL_TABLET | Freq: Every day | ORAL | 1 refills | Status: DC

## 2021-02-12 MED ORDER — HYDRALAZINE HCL 50 MG PO TABS: 50.0000 mg | ORAL_TABLET | Freq: Three times a day (TID) | ORAL | 1 refills | Status: DC

## 2021-02-12 MED ORDER — DILTIAZEM HCL ER COATED BEADS 120 MG PO CP24: ORAL_CAPSULE | ORAL | 1 refills | Status: DC

## 2021-02-12 MED ORDER — ATORVASTATIN CALCIUM 40 MG PO TABS: 40.0000 mg | ORAL_TABLET | Freq: Every day | ORAL | 1 refills | Status: DC

## 2021-02-12 MED ORDER — CLOPIDOGREL BISULFATE 75 MG PO TABS: 75.0000 mg | ORAL_TABLET | Freq: Every day | ORAL | 1 refills | Status: DC

## 2021-02-12 MED ORDER — TRESIBA FLEXTOUCH 100 UNIT/ML ~~LOC~~ SOPN: PEN_INJECTOR | SUBCUTANEOUS | 2 refills | Status: DC

## 2021-02-12 MED ORDER — METFORMIN HCL 1000 MG PO TABS: ORAL_TABLET | ORAL | 1 refills | Status: DC

## 2021-02-12 NOTE — Telephone Encounter (Signed)
-----   Message from Viona Gilmore, [facility] sent at 02/10/2021  2:13 PM EST ----- Regarding: Refills Hi,  Jason Fisher is going to try out adherence packaging with [facility]. Can you please send refills of the following to them? -atorvastatin -clopidogrel -diltiazem -furosemide -hydralazine -metformin -[name]  Thank you! Maddie

## 2021-02-13 ENCOUNTER — Other Ambulatory Visit: Payer: Self-pay

## 2021-02-13 DIAGNOSIS — N1831 Chronic kidney disease, stage 3a: Secondary | ICD-10-CM

## 2021-02-27 NOTE — Progress Notes (Signed)
Call to patient to verify that he was able to pick up his Dexcom supplies without issue from the pharmacy and schedule a time for MP to go over education on the unit. Was unable to reach left message with my contact information for return call back    [facility] Pharmacist Assistant [phone]

## 2021-02-28 NOTE — Progress Notes (Signed)
Wife returned phone call reports they did receive all of the supplies as of yesterday, appointment set up for 03/05/21 MP aware    [facility] Pharmacist Assistant [phone]

## 2021-03-04 DIAGNOSIS — I10 Essential (primary) hypertension: Secondary | ICD-10-CM

## 2021-03-04 DIAGNOSIS — I482 Chronic atrial fibrillation, unspecified: Secondary | ICD-10-CM

## 2021-03-05 ENCOUNTER — Ambulatory Visit (INDEPENDENT_AMBULATORY_CARE_PROVIDER_SITE_OTHER): Payer: Medicare Other | Admitting: Pharmacist

## 2021-03-05 DIAGNOSIS — I10 Essential (primary) hypertension: Secondary | ICD-10-CM

## 2021-03-05 DIAGNOSIS — E1151 Type 2 diabetes mellitus with diabetic peripheral angiopathy without gangrene: Secondary | ICD-10-CM

## 2021-03-05 NOTE — Progress Notes (Signed)
Chronic Care Management Pharmacy Note  03/06/2021 Name:  Jason Fisher MRN:  [id number] DOB:  11-01-1940  Summary: BP not at goal < 130/80 per home and office readings A1c at goal < 7%  Recommendations/Changes made from today's visit: -Recommended bringing BP cuff to office to compare readings -Demonstrated proper use of Dexcom  Plan: BP assessment in 1 month   Subjective: Jason Fisher is an 81 y.o. year old male who is a primary patient of [name], [name], MD.  The CCM team was consulted for assistance with disease management and care coordination needs.    Engaged with patient face to face for follow up visit in response to provider referral for pharmacy case management and/or care coordination services.   Consent to Services:  The patient was given information about Chronic Care Management services, agreed to services, and gave verbal consent prior to initiation of services.  Please see initial visit note for detailed documentation.   Patient Care Team: [name], [name], MD as PCP - General (Internal Medicine) Martinique, Peter M, MD as PCP - Cardiology (Cardiology) [place]ique, Peter M, MD as Consulting Physician (Cardiology) Gatha Mayer, MD as Consulting Physician (Gastroenterology) Viona Gilmore, [facility] as Pharmacist (Pharmacist)  Recent office visits: 02/11/20 [name], [name], MD - Patient presented for Essential hypertension. Follow up in 3 months.  Recent consult visits: 10/09/20 [name], [place] (Cardiology) - Patient presented for Permanent atrial fibrillation and other concerns. No medication changes.  07-29-2020 Baglia, Corrina, PA-C (Vasc. Surg) - Patient presented for Abdominal aortic aneurysm (AAA) without rupture ([place]). No medication changes.   07-29-2020 Serafina Mitchell, MD - Patient presented to [facility] for EVAR Duplex   06-20-2020 Collene Gobble, MD (Pulmonology) - Patient presented for Chronic bronchitis, unspecified  chronic bronchitis type. No medication changes.  Hospital visits: Medication Reconciliation was completed by comparing discharge summary, patients EMR and Pharmacy list, and upon discussion with patient.   Patient presented to [facility] on 05-10-2020 due to Acute exacerbation of CHF. Discharge date was 05-12-2020.      New?Medications Started at [facility] Discharge:?? -started  Diltiazem 120 MG 24 hr capsule- Take 1 capsule (120 mg total) by mouth daily.   Medication Changes at Hospital Discharge: -Changed  Eliquis 5 MG Tabs tablet- TAKE 1 TABLET BY MOUTH TWICE A DAY Metformin 1000 MG tablet - TAKE 1 TABLET TWICE DAILY WITH A MEAL   Medications Discontinued at Hospital Discharge: -Stopped  None   Medications that remain the same after Hospital Discharge:??  -All other medications will remain the same.       Objective:  Lab Results  Component Value Date   CREATININE 1.43 02/10/2021   BUN 23 02/10/2021   GFR 46.36 (L) 02/10/2021   GFRNONAA 59 (L) 05/12/2020   GFRAA 56 (L) 03/06/2019   NA 143 02/10/2021   K 3.6 02/10/2021   CALCIUM 9.8 02/10/2021   CO2 33 (H) 02/10/2021   GLUCOSE 96 02/10/2021    Lab Results  Component Value Date/Time   HGBA1C 6.7 (H) 02/10/2021 02:04 PM   HGBA1C 7.1 (H) 08/02/2020 10:33 AM   GFR 46.36 (L) 02/10/2021 02:04 PM   GFR 54.45 (L) 12/21/2017 10:03 AM   MICROALBUR 3.7 (H) 08/30/2015 08:21 AM   MICROALBUR 2.1 (H) 04/22/2015 11:12 AM    Last diabetic Eye exam:  Lab Results  Component Value Date/Time   HMDIABEYEEXA Retinopathy (A) 03/22/2017 12:27 PM    Last diabetic  Foot exam:  Lab Results  Component Value Date/Time   HMDIABFOOTEX done 12/10/2011 12:00 AM     Lab Results  Component Value Date   CHOL 122 02/10/2021   HDL 36.70 (L) 02/10/2021   LDLCALC 66 02/10/2021   TRIG 94.0 02/10/2021   CHOLHDL 3 02/10/2021    Hepatic Function Latest Ref Rng & Units 02/10/2021 08/02/2020 05/11/2020  Total Protein 6.0 - 8.3 g/dL 7.9 7.6  7.5  Albumin 3.5 - 5.2 g/dL 3.9 4.5 3.9  AST 0 - 37 U/L '15 17 28  ' ALT 0 - 53 U/L '11 13 27  ' Alk Phosphatase 39 - 117 U/L 75 91 88  Total Bilirubin 0.2 - 1.2 mg/dL 0.7 0.5 1.3(H)  Bilirubin, Direct 0.00 - 0.40 mg/dL - 0.19 -    Lab Results  Component Value Date/Time   TSH 2.090 10/09/2020 11:13 AM   TSH 0.424 04/22/2016 03:55 AM   TSH 2.47 08/30/2015 08:19 AM   FREET4 1.12 01/14/2010 05:55 AM    CBC Latest Ref Rng & Units 05/11/2020 05/10/2020 03/06/2019  WBC 4.0 - 10.5 K/uL 10.4 9.1 8.7  Hemoglobin 13.0 - 17.0 g/dL 12.9(L) 12.0(L) 14.2  Hematocrit 39.0 - 52.0 % 40.7 37.6(L) 43.7  Platelets 150 - 400 K/uL 194 168 180    No results found for: VD25OH  Clinical ASCVD: Yes  The ASCVD Risk score (Arnett DK, et al., 2019) failed to calculate for the following reasons:   The 2019 ASCVD risk score is only valid for ages 16 to 70   The patient has a prior MI or stroke diagnosis    Depression screen [facility] 2/9 02/10/2021 05/24/2020 05/12/2019  Decreased Interest 0 0 0  Down, Depressed, Hopeless 0 0 0  PHQ - 2 Score 0 0 0  Altered sleeping - 0 0  Tired, decreased energy - 0 0  Change in appetite - 0 0  Feeling bad or failure about yourself  - 0 0  Trouble concentrating - 0 0  Moving slowly or fidgety/restless - 0 0  Suicidal thoughts - 0 0  PHQ-9 Score - 0 0  Difficult doing work/chores - Not difficult at all Not difficult at all  Some recent data might be hidden     CHA2DS2/VAS Stroke Risk Points  Current as of 2 hours ago     6 >= 2 Points: High Risk  1 - 1.99 Points: Medium Risk  0 Points: Low Risk    Last Change: N/A      Details    This score determines the patient's risk of having a stroke if the  patient has atrial fibrillation.       Points Metrics  1 Has Congestive Heart Failure:  Yes    Current as of 2 hours ago  1 Has Vascular Disease:  Yes    Current as of 2 hours ago  1 Has Hypertension:  Yes    Current as of 2 hours ago  2 Age:  9    Current as of 2 hours ago  1  Has Diabetes:  Yes    Current as of 2 hours ago  0 Had Stroke:  No  Had TIA:  No  Had Thromboembolism:  No    Current as of 2 hours ago  0 Male:  No    Current as of 2 hours ago          Social History   Tobacco Use  Smoking Status Some Days   Packs/day: 1.00  Years: 49.00   Pack years: 49.00   Types: Cigarettes   Last attempt to quit: 09/08/2015   Years since quitting: 5.4  Smokeless Tobacco Never  Tobacco Comments   passive smoker as well   BP Readings from Last 3 Encounters:  02/10/21 (!) 150/100  10/09/20 (!) 150/102  09/23/20 140/82   Pulse Readings from Last 3 Encounters:  02/10/21 (!) 110  10/09/20 91  08/20/20 (!) 55   Wt Readings from Last 3 Encounters:  02/10/21 169 lb 3.2 oz (76.7 kg)  10/09/20 175 lb 12.8 oz (79.7 kg)  08/20/20 173 lb 9.6 oz (78.7 kg)   BMI Readings from Last 3 Encounters:  02/10/21 25.73 kg/m  10/09/20 26.73 kg/m  08/20/20 26.40 kg/m    Assessment/Interventions: Review of patient past medical history, allergies, medications, health status, including review of consultants reports, laboratory and other test data, was performed as part of comprehensive evaluation and provision of chronic care management services.   SDOH:  (Social Determinants of Health) assessments and interventions performed: No   SDOH Screenings   Alcohol Screen: Not on file  Depression (PHQ2-9): Low Risk    PHQ-2 Score: 0  Financial Resource Strain: Medium Risk   Difficulty of Paying Living Expenses: Somewhat hard  Food Insecurity: Not on file  Housing: Not on file  Physical Activity: Not on file  Social Connections: Not on file  Stress: Not on file  Tobacco Use: High Risk   Smoking Tobacco Use: Some Days   Smokeless Tobacco Use: Never   Passive Exposure: Not on file  Transportation Needs: No Transportation Needs   Lack of Transportation (Medical): No   Lack of Transportation (Non-Medical): No     CCM Care Plan  Allergies  Allergen Reactions    Contrast Media [Iodinated Contrast Media] Anaphylaxis    Medications Reviewed Today     Reviewed by [name], [name], MD (Physician) on 02/10/21 at 1353  Med List Status: <None>   Medication Order Taking? Sig Documenting Provider Last Dose Status Informant  ACCU-CHEK AVIVA PLUS test strip [id number] Yes CHECK BLOOD SUGAR ONCE DAILY. DX E11.51 [name], [name], MD Taking Active Self  atorvastatin (LIPITOR) 40 MG tablet [id number] Yes Take 1 tablet (40 mg total) by mouth daily. [name], [name], MD Taking Active   BEVESPI AEROSPHERE 9-4.8 MCG/ACT AERO [id number] Yes TAKE 2 PUFFS BY MOUTH TWICE A DAY Collene Gobble, MD Taking Active   clopidogrel (PLAVIX) 75 MG tablet [id number] Yes TAKE 1 TABLET BY MOUTH EVERY DAY [name], [name], MD Taking Active   diltiazem (CARDIZEM CD) 120 MG 24 hr capsule [id number] Yes TAKE 1 CAPSULE BY MOUTH EVERY DAY [name], [name], MD Taking Active   ELIQUIS 5 MG TABS tablet [id number] Yes TAKE 1 TABLET BY MOUTH TWICE A DAY Martinique, Peter M, MD Taking Active   furosemide (LASIX) 80 MG tablet [id number] Yes TAKE 1 TABLET BY MOUTH EVERY DAY [name], [name], MD Taking Active   Discontinued 02/10/21 1323 (Change in therapy)   hydrALAZINE (APRESOLINE) 50 MG tablet [id number] Yes TAKE 1 TABLET BY MOUTH THREE TIMES A DAY [name], [name], MD Taking Active   levalbuterol [facility] HFA) 45 MCG/ACT inhaler [id number] Yes INHALE 1 TO 2 PUFFS BY MOUTH EVERY 6 HOURS AS NEEDED FOR WHEEZE Collene Gobble, MD Taking Active   metFORMIN (GLUCOPHAGE) 1000 MG tablet [id number] Yes TAKE 1 TABLET TWICE DAILY WITH A MEAL [name], [name], MD Taking Active  metoprolol tartrate (LOPRESSOR) 100 MG tablet [id number] Yes TAKE 1 TABLET BY MOUTH TWICE A DAY Martinique, Peter M, MD Taking Active   nitroGLYCERIN (NITROSTAT) 0.4 MG SL tablet [id number] Yes Place 1 tablet (0.4 mg total) under the tongue every 5 (five) minutes as needed  for chest pain. X 3 doses [name], [name], MD Taking Active   polyethylene glycol [facility] / GLYCOLAX) packet [id number] Yes Take 17 g by mouth daily as needed for mild constipation. [provider] Taking Active Self  TRESIBA FLEXTOUCH 100 UNIT/ML FlexTouch Pen [id number] Yes INJECT 32 UNITS INTO THE SKIN AT BEDTIME. [name], [name], MD Taking Active             Patient Active Problem List   Diagnosis Date Noted   Acute exacerbation of CHF (congestive heart failure) ([place]) 05/11/2020   Hyperglycemia due to diabetes mellitus ([place]) 05/11/2020   Elevated brain natriuretic peptide (BNP) level 05/11/2020   Hypertensive urgency 05/11/2020   Prolonged QT interval 05/11/2020   Elevated troponin 04/22/2016   COPD (chronic obstructive pulmonary disease) ([place]) 02/07/2016   Abdominal aortic aneurysm (AAA) without rupture    Perirectal abscess 11/04/2015   Pulmonary nodules 11/04/2015   Coronary artery disease of bypass graft of native heart with stable angina pectoris ([place])    Allergic reaction to contrast dye 09/27/2015   Accelerated hypertension with diastolic congestive heart failure, NYHA class 3 ([place]) 09/27/2015   CHF (congestive heart failure) ([place]) 09/25/2015   CKD (chronic kidney disease), stage III (HCC)    Acute on chronic combined systolic and diastolic congestive heart failure (HCC)    Diastolic congestive heart failure (HCC)    Paroxysmal atrial fibrillation ([place])    Angiodysplasia of colon 04/19/2015   TOBACCO USER 04/17/2010   Chronic atrial fibrillation ([place]): CHA2DS2Vasc = 5. On Warfarin. Rate control 11/06/2009   Occlusion and stenosis of carotid artery without mention of cerebral infarction 07/13/2008   Abdominal aortic aneurysm ([place]) 07/13/2008   BENIGN PROSTATIC HYPERTROPHY, HX OF 02/24/2007   MYOCARDIAL INFARCTION, HX OF 11/02/2006   CAD S/P percutaneous coronary angioplasty 11/02/2006   History of colonic polyps 11/02/2006   Dyslipidemia  10/29/2006   Essential hypertension 10/29/2006    Immunization History  Administered Date(s) Administered   Fluad Quad(high Dose 65+) 02/07/2020   Influenza, High Dose Seasonal PF 01/21/2015, 02/05/2016, 11/23/2016, 10/28/2017, 01/17/2021   Influenza,inj,Quad PF,6+ Mos 10/31/2013   Influenza-Unspecified 11/23/2016, 12/25/2018   PFIZER(Purple Top)SARS-COV-2 Vaccination 06/01/2019, 07/02/2019   Pneumococcal Conjugate-13 01/09/2013   Pneumococcal Polysaccharide-23 03/07/2014   Tetanus 07/31/2013   Patient brought Dexcom G6 CGM supplies and presents today for education on use and initial placement.  I have reviewed proper use, including but not limited to: glucose direction and how to incorporate it in treatment decisions including meal insulin and exercise, evaluating previous evening trends first thing in the morning, and downloading and attaching to email.  Sensor lag was explained. Oriented to the reader device and reminded not to discard Transmitter when the sensors are replaced every 10 days. Set lower BG alarm to 70 and upper BG alarm to 250.  No calibration and no finger sticks needed. 2 hour warm up and staying within 20 feet of transmitter reviewed. Advised not to administer insulin within 3 inches of sensor.  Procedure and information discussed with patient. Patient has no further questions at this time. Verbalizes understanding and agrees to have CGM placed.  left side of abdomen cleaned and prepared as instructed by manufacturers instructions. Dexcom  G6 CGM placed as instructed by manufacturers instructions. No redness, swelling, bleeding, or bruising noted. Sensor activated and monitoring at this time. Patient is aware to resume normal activities including bathing, getting dressed, and exercise.   Conditions to be addressed/monitored:  Hypertension, Hyperlipidemia, Diabetes, Atrial Fibrillation, Heart Failure, Coronary Artery Disease, COPD, and Tobacco use  Conditions addressed this  visit: Diabetes, hypertension  Care Plan : CCM Pharmacy Care Plan  Updates made by Viona Gilmore, [place] since 03/06/2021 12:00 AM     Problem: Problem: Hypertension, Hyperlipidemia, Diabetes, Atrial Fibrillation, Heart Failure, Coronary Artery Disease, COPD, and Tobacco use      Long-Range Goal: Patient-Specific Goal   Start Date: 09/23/2020  Expected End Date: 09/23/2021  Recent Progress: On track  Priority: High  Note:   Current Barriers:  Unable to independently afford treatment regimen Unable to achieve control of blood pressure  Unable to self administer medications as prescribed  Pharmacist Clinical Goal(s):  Patient will verbalize ability to afford treatment regimen achieve adherence to monitoring guidelines and medication adherence to achieve therapeutic efficacy achieve control of blood pressure as evidenced by home BP readings maintain control of cholesterol and diabetes as evidenced by lipid panel and A1c  through collaboration with PharmD and provider.   Interventions: 1:1 collaboration with [name], [name], MD regarding development and update of comprehensive plan of care as evidenced by provider attestation and co-signature Inter-disciplinary care team collaboration (see longitudinal plan of care) Comprehensive medication review performed; medication list updated in electronic medical record  Hypertension (BP goal <130/80) -Uncontrolled -Current treatment: Diltiazem 120 mg 1 capsule daily - Appropriate, Query effective, Safe, Accessible Hydralazine 50 mg 1 tablet three times daily - Appropriate, Query effective, Safe, Accessible Metoprolol tartrate 100 mg 1 tablet twice daily - Appropriate, Query effective, Safe, Accessible -Medications previously tried: unknown  -Current home readings: 165/86, 116/67, 170/89, 148/92, 156/87, 170/82, 156/84, 159/87, 162/92, 137/95, 150/93, 178/87, 147/92, 137/87, 146/105 (checking daily with wrist cuff) -Current dietary  habits: doesn't season with much salt; rinsing canned vegetables; not eating out often; recommended reading package labels -Current exercise habits: no structured exercise (out of breath walking to the mailbox) -Denies hypotensive/hypertensive symptoms -Educated on BP goals and benefits of medications for prevention of heart attack, stroke and kidney damage; Daily salt intake goal < 2300 mg; Exercise goal of 150 minutes per week; Importance of home blood pressure monitoring; Proper BP monitoring technique; -Counseled to monitor BP at home daily, document, and provide log at future appointments -Counseled on diet and exercise extensively Recommended to continue current medication Scheduled follow up with PCP for BP management.  Hyperlipidemia: (LDL goal < 55) -Not ideally controlled -Current treatment: Atorvastatin 40 mg 1 tablet daily - Appropriate, Query effective, Safe, Accessible -Medications previously tried: none  -Current dietary patterns: doesn't fry foods often; uses vegetable or olive oil -Current exercise habits: no structured exercise  -Educated on Cholesterol goals;  Benefits of statin for ASCVD risk reduction; Importance of limiting foods high in cholesterol; Exercise goal of 150 minutes per week; -Counseled on diet and exercise extensively Recommended to continue current medication  CAD (Goal: prevent heart events) -Controlled -Current treatment  Atorvastatin 40 mg 1 tablet daily - Appropriate, Query effective, Safe, Accessible Nitroglycerin 0.4 mg SL tablet as needed - Appropriate, Effective, Safe, Accessible Clopidogrel 75 mg 1 tablet daily - Appropriate, Effective, Safe, Accessible Metoprolol tartrate 100 mg 1 tablet twice daily - Appropriate, Effective, Safe, Accessible -Medications previously tried: none  -Recommended to continue current medication  Counseled on importance of checking expiration date of nitroglycerin. Requested a refill for patient as his is  expired.    Diabetes (A1c goal <7%) -Controlled -Current medications: Tresiba 100 units/mL inject 32 units at bedtime - Appropriate, Effective, Safe, Accessible Metformin 1000 mg 1 tablet twice daily at meals - Appropriate, Effective, Safe, Accessible -Medications previously tried: unknown  -Current home glucose readings fasting glucose: not checking consistently post prandial glucose: patient stopped checking at home -Denies hypoglycemic/hyperglycemic symptoms -Current meal patterns:  breakfast: eggs and bacon or sausage OR biscuit and cheese  lunch: none  dinner: nuggets and mashed potatoes snacks: moonpie drinks: water, sweet tea, mt dew, coffee in AM -Current exercise: no structured exercise -Educated on A1c and blood sugar goals; Exercise goal of 150 minutes per week; Benefits of routine self-monitoring of blood sugar; Continuous glucose monitoring; Carbohydrate counting and/or plate method -Counseled to check feet daily and get yearly eye exams -Counseled on diet and exercise extensively Recommended to continue current medication Recommended for patient to use Dexcom to monitor blood sugars.  Atrial Fibrillation (Goal: prevent stroke and major bleeding) -Controlled -CHADSVASC: 6 -Current treatment: Rate control: Diltiazem 120 mg 1 capsule daily; Metoprolol tartrate 100 mg 1 tablet twice daily - Appropriate, Effective, Safe, Accessible Anticoagulation: Eliquis 5 mg 1 tablet twice daily - Appropriate, Effective, Safe, Accessible -Medications previously tried: none -Home BP and HR readings: could not provide  -Counseled on increased risk of stroke due to Afib and benefits of anticoagulation for stroke prevention; importance of adherence to anticoagulant exactly as prescribed; avoidance of NSAIDs due to increased bleeding risk with anticoagulants; -Counseled on diet and exercise extensively Recommended to continue current medication  Heart Failure (Goal: manage symptoms  and prevent exacerbations) -Controlled -Last ejection fraction: 50-55% (Date: 05/11/20) -HF type: Diastolic -NYHA Class: III (marked limitation of activity) -AHA HF Stage: C (Heart disease and symptoms present) -Current treatment: Furosemide 80 mg 1 tablet daily - Appropriate, Effective, Safe, Accessible Metoprolol tartrate 100 mg 1 tablet twice daily - Appropriate, Effective, Safe, Accessible -Medications previously tried: none  -Current home BP/HR readings: refer to above -Current dietary habits: limits use of salt shaker; does not read nutrition labels for sodium content -Current exercise habits: no structured exercise -Educated on Benefits of medications for managing symptoms and prolonging life Importance of weighing daily; if you gain more than 3 pounds in one day or 5 pounds in one week, call cardiologist. -Counseled on diet and exercise extensively Recommended to continue current medication  COPD (Goal: control symptoms and prevent exacerbations) -Not ideally controlled -Current treatment  Levalbuterol 45 mcg/act 1-2 puffs every 4-6 hours as needed - Appropriate, Effective, Safe, Accessible Bevespi aerosphere 9-4.8 mcg/act 2 puffs twice daily - Appropriate, Effective, Safe, Query accessible -Medications previously tried: unknown  -Gold Grade: Gold 3 (FEV1 30-49%) -Current COPD Classification:  B (high sx, <2 exacerbations/yr) -MMRC/CAT score: n/a -Pulmonary function testing: 2017 -Exacerbations requiring treatment in last 6 months: none -Patient reports consistent use of maintenance inhaler -Frequency of rescue inhaler use: once daily or more; every time he uses steps -Counseled on Proper inhaler technique; Benefits of consistent maintenance inhaler use When to use rescue inhaler Differences between maintenance and rescue inhalers - Consider triple inhaler therapy given current tobacco smoker and using rescue inhaler once a day or more.  Tobacco use (Goal quit  smoking) -Uncontrolled -Previous quit attempts: none -Current treatment  No medications -Patient smokes After 30 minutes of waking -Patient triggers include:  enjoyment -On a scale of 1-10, reports  MOTIVATION to quit is 9 -On a scale of 1-10, reports CONFIDENCE in quitting is 9 -Counseled on park & chew method for NRT gum. -Counseled on benefits of NRT or slowly backing down on use of cigarettes as breathing will improve with complete cessation.  Health Maintenance -Vaccine gaps:  influenza, shingrix -Current therapy:  Miralax as needed -Educated on Cost vs benefit of each product must be carefully weighed by individual consumer -Patient is satisfied with current therapy and denies issues -Recommended to continue current medication  Patient Goals/Self-Care Activities Patient will:  - take medications as prescribed focus on medication adherence by setting alarms to remember to take medications check glucose daily, document, and provide at future appointments check blood pressure daily, document, and provide at future appointments target a minimum of 150 minutes of moderate intensity exercise weekly  Follow Up Plan: The care management team will reach out to the patient again over the next 60 days.         Medication Assistance: Application for Laural Golden and Eliquis  medication assistance program. in process.  Anticipated assistance start date 10/24/21.  See plan of care for additional detail.  Compliance/Adherence/Medication fill history: Care Gaps: Eye exam, foot exam, influenza vaccine BP - 150/102 ( 10/09/20)  Star-Rating Drugs: Metformin (Glucophage) 1000 mg - [facility] Atorvastatin (Lipitor) 40 mg - Last filled 11/08/2020- [facility]  Patient's preferred pharmacy is:  [id number] [place], [place] - [address] [place] [address]Phone: [phone]:  [phone] [facility] [place] [place]: [phone]: [phone]  Uses pill box? Yes - fills it up one week at a time Pt endorses 90% compliance  We discussed: Benefits of medication synchronization, packaging and delivery as well as enhanced pharmacist oversight with [facility]. Patient decided to: Utilize [facility] pharmacy for medication synchronization, packaging and delivery  Care Plan and Follow Up Patient Decision:  Patient agrees to Care Plan and Follow-up.  Plan: The care management team will reach out to the patient again over the next 60 days.  MJeni Salles PharmD, BLintonPharmacist [place]

## 2021-03-22 ENCOUNTER — Other Ambulatory Visit: Payer: Self-pay | Admitting: Internal Medicine

## 2021-03-22 DIAGNOSIS — E1151 Type 2 diabetes mellitus with diabetic peripheral angiopathy without gangrene: Secondary | ICD-10-CM

## 2021-04-01 DIAGNOSIS — I10 Essential (primary) hypertension: Secondary | ICD-10-CM

## 2021-04-01 DIAGNOSIS — E1151 Type 2 diabetes mellitus with diabetic peripheral angiopathy without gangrene: Secondary | ICD-10-CM

## 2021-04-09 ENCOUNTER — Telehealth: Payer: Self-pay | Admitting: Pharmacist

## 2021-04-09 NOTE — Progress Notes (Signed)
Cardiology Office Note    Date:  04/14/2021   ID:  Jason Fisher, DOB 27-Dec-1940, MRN [id number]  PCP:  [name], [name], MD  Cardiologist:  Dr. Martinique   Chief Complaint  Patient presents with   Atrial Fibrillation   Coronary Artery Disease   Congestive Heart Failure    History of Present Illness:  Jason Fisher is a 81 y.o. male seen for evaluation of SOB and fatigue. He has a PMH of CAD (s/p BMS to RCA 2007, ISR with DES to RCA in 2009, DES to OM2 in 09/2015), chronic atrial fibrillation on Eliquis, chronic diastolic heart failure, HTN, HLD, AAA, COPD, and tobacco use.  He was previously admitted in March 2018 for atrial fibrillation with RVR.  Troponin was borderline elevated.  He was initially treated with IV Cardizem and later switched to p.o. Cardizem prior to discharge.  He also underwent AAA endovascular stent grafting by Dr. Trula Slade in June 2018.  He was seen in October 2019 by [name] PA-C for increased edema.  Lasix was increased to 80 mg twice daily for 3 days before decreasing back down to 40 mg twice daily thereafter.  Echocardiogram on 11/05/2017 this showed EF 60 to 65%, mildly increased aortic root measuring 40 mm, moderately dilated left and the right atrium.   When seen in September 2021 he was not feeling well. He has a lot of congestion in his chest with clear phlegm. Had been using Dayquil. He was more SOB. No increase in edema or weight. He  felt very tired and is dizzy. Had one episode of chest pain relieved with sl Ntg. He was found to be markedly bradycardic with HR 42. Diltiazem was stopped and metoprolol dose reduced. On follow up one week later HR was 119 in Afib. Metoprolol was increased back to 100 mg bid. CXR showed mild pleural effusions suggestive of mild CHF. Emphysema.   He was admitted in April 2022 with worsening dyspnea on exertion.  While in the emergency room, blood pressure was 207/109.  He was admitted for acute on chronic diastolic heart  failure and A. fib with RVR.  Echocardiogram obtained during that admission showed EF 50 to 55%, no regional wall motion abnormality, moderate LVH.  He was eventually released on the prior home dose of Lasix 80 mg daily.  Cardizem was added back every 6 hours and he was eventually transitioned to 120 mg daily of Cardizem CD.  AAA ultrasound obtained in June 2022 showed a stable sac measuring 3.98 in transverse dimension. When seen in September Afib rate was controlled. TFTs were normal. He was started on hydralazine for BP.   On follow up today he is doing OK. He is mainly limited by SOB with his COPD. Now has a scooter. Has 14 steps to his house and gives out of breath climbing the stairs. Building a ramp. Feet were swollen a couple of weeks ago. Took extra lasix with resolution. BP is labile. No chest pain or palpitations.    Past Medical History:  Diagnosis Date   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 2007 Inf STEMI s/p Vision BMS  2 to RCA;  b. 2009 ISR Prox RCA Stent-->with DES;  c. 09/2015 PCI: LM nl, LAD 30p/m, D1 90, LCX nl, OM1 small, 80, OM2 90 (2.5x14 Biofreedom stent), OM3 small, RCA 30p ISR, 55mISR, 43m85d, EF 50-55%.  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 On Eliquis (CHA2DS2VASc = 5).  Rate controlled w/ BB and CCB.   Chronic diastolic CHF (congestive heart failure) ([place])    a. 09/2015 Echo: EF 55-60%, mildly dil LA.   CKD (chronic kidney disease), stage III ([place])    they mentioned this to the patient but they would work on it later after the aneurysm   COLONIC POLYPS, HX OF 11/02/2006   COPD (chronic obstructive pulmonary disease) ([place])    DIABETES MELLITUS, TYPE II 10/29/2006   Diverticulosis    History of tobacco abuse    a. Quit 09/2015.   HYPERLIPIDEMIA 10/29/2006   Hypertension    Hypertensive heart disease with heart failure ([place])    Pneumonia 09/2015    [name] 07/31/2016   STEMI (ST elevation myocardial infarction) [facility]) 2007   [name] 07/31/2016    Past Surgical History:  Procedure Laterality Date   ABDOMINAL AORTIC ENDOVASCULAR STENT GRAFT N/A 07/31/2016   Procedure: ABDOMINAL AORTIC ENDOVASCULAR STENT GRAFT;  Surgeon: Serafina Mitchell, MD;  Location: [facility];  Service: Vascular;  Laterality: N/A;   CARDIAC CATHETERIZATION N/A 09/26/2015   Procedure: Left Heart Cath and Coronary Angiography;  Surgeon: Elliett Guarisco M Martinique, MD;  Location: [facility];  Service: Cardiovascular;  Laterality: N/A;   CARDIAC CATHETERIZATION N/A 09/26/2015   Procedure: Coronary Stent Intervention;  Surgeon: Lukis Bunt M Martinique, MD;  Location: [facility];  Service: Cardiovascular;  Laterality: N/A;   CATARACT EXTRACTION Bilateral    COLONOSCOPY     CORONARY ANGIOPLASTY WITH STENT PLACEMENT  2007   Inferior STEMI 2007 - RCA PCI with 2 Vision BMS; Abnormal Myoview in 2009 - pRCA ins-stent restenosis & unstented disease - DES PCI Promus 3.0 x 20 mm (3.5 mm)   ENDOVASCULAR REPAIR/STENT GRAFT  07/31/2016   INCISION AND DRAINAGE PERIRECTAL ABSCESS N/A 11/06/2015   Procedure: IRRIGATION AND DEBRIDEMENT PERIRECTAL ABSCESS, POSSIBLE HEMORRHOIDECTOMY;  Surgeon: Coralie Keens, MD;  Location: [facility];  Service: General;  Laterality: N/A;   ORIF SHOULDER FRACTURE Right    "fell out of back end of a truck"    Current Medications: Outpatient Medications Prior to Visit  Medication Sig Dispense Refill   ACCU-CHEK AVIVA PLUS test strip CHECK BLOOD SUGAR ONCE DAILY. DX E11.51 100 strip 2   apixaban (ELIQUIS) 5 MG TABS tablet Take 1 tablet (5 mg total) by mouth 2 (two) times daily. 180 tablet 1   atorvastatin (LIPITOR) 40 MG tablet TAKE 1 TABLET BY MOUTH EVERY DAY 90 tablet 1   clopidogrel (PLAVIX) 75 MG tablet Take 1 tablet (75 mg total) by mouth daily. 90 tablet 1   Continuous Blood Gluc Receiver (DEXCOM G6 RECEIVER) DEVI Check CBGs as directed 1 each 0   Continuous Blood Gluc  Sensor (DEXCOM G6 SENSOR) MISC Apply one sensor every 10 days. 3 each 2   Continuous Blood Gluc Transmit (DEXCOM G6 TRANSMITTER) MISC Apply to sensor every 90 days 1 each 0   diltiazem (CARDIZEM CD) 120 MG 24 hr capsule TAKE 1 CAPSULE BY MOUTH EVERY DAY 90 capsule 1   furosemide (LASIX) 80 MG tablet Take 1 tablet (80 mg total) by mouth daily. 90 tablet 1   Glycopyrrolate-Formoterol (BEVESPI AEROSPHERE) 9-4.8 MCG/ACT AERO TAKE 2 PUFFS BY MOUTH TWICE A DAY 10.7 g 5   hydrALAZINE (APRESOLINE) 50 MG tablet Take 1 tablet (50 mg total) by mouth 3 (three) times daily. 270 tablet 1  levalbuterol (XOPENEX HFA) 45 MCG/ACT inhaler INHALE 1 TO 2 PUFFS BY MOUTH EVERY 6 HOURS AS NEEDED FOR WHEEZE 15 each 3   metFORMIN (GLUCOPHAGE) 1000 MG tablet Take 1 tablet by mouth twice daily. 180 tablet 1   metoprolol tartrate (LOPRESSOR) 100 MG tablet Take 1 tablet (100 mg total) by mouth 2 (two) times daily. 180 tablet 3   nitroGLYCERIN (NITROSTAT) 0.4 MG SL tablet Place 1 tablet (0.4 mg total) under the tongue every 5 (five) minutes as needed for chest pain. X 3 doses 25 tablet 4   polyethylene glycol (MIRALAX / GLYCOLAX) packet Take 17 g by mouth daily as needed for mild constipation.     TRESIBA FLEXTOUCH 100 UNIT/ML FlexTouch Pen INJECT 32 UNITS INTO THE SKIN AT BEDTIME. 15 mL 2   No facility-administered medications prior to visit.     Allergies:   Contrast media [iodinated contrast media]   Social History   Socioeconomic History   Marital status: Married    Spouse name: Not on file   Number of children: 5   Years of education: Not on file   Highest education level: Not on file  Occupational History   Occupation: [employer]  Tobacco Use   Smoking status: Some Days    Packs/day: 1.00    Years: 49.00    Pack years: 49.00    Types: Cigarettes    Last attempt to quit: 09/08/2015    Years since quitting: 5.6   Smokeless tobacco: Never   Tobacco comments:    passive smoker as well  Vaping Use    Vaping Use: Never used  Substance and Sexual Activity   Alcohol use: No    Alcohol/week: 0.0 standard drinks   Drug use: No   Sexual activity: Not on file  Other Topics Concern   Not on file  Social History Narrative   Not on file   Social Determinants of Health   Financial Resource Strain: Medium Risk   Difficulty of Paying Living Expenses: Somewhat hard  Food Insecurity: Not on file  Transportation Needs: No Transportation Needs   Lack of Transportation (Medical): No   Lack of Transportation (Non-Medical): No  Physical Activity: Not on file  Stress: Not on file  Social Connections: Not on file     Family History:  The patient's family history includes Diabetes in his brother and mother; Heart attack in his brother, father, and mother; Hypertension in his father.   ROS:   Please see the history of present illness.    ROS All other systems reviewed and are negative.   PHYSICAL EXAM:   VS:  BP (!) 158/82    Pulse 74    Ht '5\' 9"'$  (1.753 m)    Wt 171 lb 12.8 oz (77.9 kg)    SpO2 93%    BMI 25.37 kg/m    GENERAL:  Chronically ill appearing WM in NAD HEENT:  PERRL, EOMI, sclera are clear. Oropharynx is clear. NECK:  No jugular venous distention, carotid upstroke brisk and symmetric, no bruits, no thyromegaly or adenopathy LUNGS:  Coarse BS with wheezes bilaterally.  CHEST:  Unremarkable HEART:  IRRR,  PMI not displaced or sustained,S1 and S2 within normal limits, no S3, no S4: no clicks, no rubs, no murmurs ABD:  Soft, nontender. BS +, no masses or bruits. No hepatomegaly, no splenomegaly EXT:  2 + pulses throughout, no edema, no cyanosis no clubbing SKIN:  Warm and dry.  No rashes NEURO:  Alert and oriented x  3. Cranial nerves II through XII intact. PSYCH:  Cognitively intact    Wt Readings from Last 3 Encounters:  04/14/21 171 lb 12.8 oz (77.9 kg)  02/10/21 169 lb 3.2 oz (76.7 kg)  10/09/20 175 lb 12.8 oz (79.7 kg)      Studies/Labs Reviewed:   EKG:  EKG is not   ordered today.     Recent Labs: 05/10/2020: B Natriuretic Peptide 696.0 05/11/2020: Hemoglobin 12.9; Magnesium 1.8; Platelets 194 10/09/2020: TSH 2.090 02/10/2021: ALT 11; BUN 23; Creatinine, Ser 1.43; Potassium 3.6; Sodium 143   Lipid Panel    Component Value Date/Time   CHOL 122 02/10/2021 1404   CHOL 141 08/02/2020 1033   TRIG 94.0 02/10/2021 1404   HDL 36.70 (L) 02/10/2021 1404   HDL 39 (L) 08/02/2020 1033   CHOLHDL 3 02/10/2021 1404   VLDL 18.8 02/10/2021 1404   LDLCALC 66 02/10/2021 1404   LDLCALC 82 08/02/2020 1033    Additional studies/ records that were reviewed today include:   Cath 09/26/2015 Prox LAD to Mid LAD lesion, 30 %stenosed. 1st Diag lesion, 90 %stenosed. Ost 1st Mrg to 1st Mrg lesion, 80 %stenosed. Ost RCA to Prox RCA lesion, 30 %stenosed. Mid RCA-2 lesion, 40 %stenosed. Mid RCA-1 lesion, 15 %stenosed. 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 There is mild left ventricular systolic dysfunction. The left ventricular ejection fraction is 50-55% by visual estimate. LV end diastolic pressure is moderately elevated. A drug eluting stent was successfully placed. 2nd Mrg lesion, 90 %stenosed. Post intervention, there is a 0% residual stenosis.   1. 3 vessel obstructive CAD.     - chronic severe stenosis in the first diagonal    - New 90% stenosis in a large second OM    - Stents in the proximal and mid RCA are patent    - Aneurysm and stenosis in the PLOM branch of the RCA- the aneurysm is new compared to 2009 2. Mild LV dysfunction 3. Elevated LVEDP 4. Allergic response to contrast with hives and bronchospasm. 5. Successful stenting of the second OM with a Biofreedom stent.    Plan: continue IV diuresis. Patient received steroids, Benadryl, and IV pepcid for contrast reaction. Continue DAPT for one month then DC ASA. Resume Eliquis in am.        Echo 11/05/2017 LV EF: 60% -   65% Study Conclusions   - Left ventricle: The  cavity size was normal. There was moderate   concentric hypertrophy. Systolic function was normal. The   estimated ejection fraction was in the range of 60% to 65%. Wall   motion was normal; there were no regional wall motion   abnormalities. The study was not technically sufficient to allow   evaluation of LV diastolic dysfunction due to atrial   fibrillation. - Aortic valve: Poorly visualized. Trileaflet; moderately   thickened, moderately calcified leaflets. - Aorta: Aortic root dimension: 40 mm (ED). Ascending aortic   diameter: 38 mm (S). - Aortic root: The aortic root was mildly dilated. - Ascending aorta: The ascending aorta was mildly dilated. - Left atrium: The atrium was moderately dilated. - Right atrium: The atrium was moderately dilated. - Pulmonic valve: There was mild regurgitation. - Pulmonary arteries: Systolic pressure could not be accurately   estimated.     Echo 05/11/2020  1. Left ventricular ejection fraction, by estimation, is 50 to 55%. The  left ventricle has low normal function. The left ventricle has no regional  wall  motion abnormalities. There is moderate left ventricular hypertrophy.  Left ventricular diastolic  function could not be evaluated.   2. Right ventricular systolic function is normal. The right ventricular  size is normal. There is normal pulmonary artery systolic pressure.   3. Left atrial size was mildly dilated.   4. The mitral valve is grossly normal. Trivial mitral valve  regurgitation.   5. The aortic valve is grossly normal. Aortic valve regurgitation is  trivial.   6. The inferior vena cava is dilated in size with >50% respiratory  variability, suggesting right atrial pressure of 8 mmHg.     ASSESSMENT:    1. Permanent atrial fibrillation ([place])   2. Coronary artery disease involving native coronary artery of native heart without angina pectoris   3. Chronic diastolic congestive heart failure ([place])   4. Essential hypertension    5. Stage 3 chronic kidney disease, unspecified whether stage 3a or 3b CKD ([place])   6. S/P AAA repair       PLAN:  In order of problems listed above:  Chronic atrial fibrillation. Rate is well controlled.  Continue Eliquis. Continue current metoprolol dose.   CAD: s/p multiple interventions in the past- last in 2017. On Plavix and lopressor. On statin. No significant angina.  Chronic diastolic CHF: volume status is good on current lasix dose. No edema and weight is stable. Continue to use extra lasix PRN.   Hyperlipidemia: On Lipitor 40 mg daily.  LDL 66  History of AAA repair: followed by [facility].   6..  DM type 2 on metformin.  A1c- 6.7.   Follow up in 6 months   Medication Adjustments/Labs and Tests Ordered: Current medicines are reviewed at length with the patient today.  Concerns regarding medicines are outlined above.  Medication changes, Labs and Tests ordered today are listed in the Patient Instructions below. There are no Patient Instructions on file for this visit.   Signed, Feven Alderfer Martinique, MD  04/14/2021 9:35 AM    [facility] [place], [place], [place]  [address] Phone: [phone]; Fax: [phone]

## 2021-04-09 NOTE — Chronic Care Management (AMB) (Signed)
? ? ?Chronic Care Management ?Pharmacy Assistant  ? ?Name: Jason Fisher  MRN: [id number] DOB: 08-24-1940 ? ?Reason for Encounter: Medication Review ?  ?Conditions to be addressed/monitored: ?HTN ? ?Recent office visits:  ?None ? ?Recent consult visits:  ?None  ? ?Hospital visits:  ?None in previous 6 months ? ?Medications: ?Outpatient Encounter Medications as of 04/09/2021  ?Medication Sig  ? ACCU-CHEK AVIVA PLUS test strip CHECK BLOOD SUGAR ONCE DAILY. DX E11.51  ? apixaban (ELIQUIS) 5 MG TABS tablet Take 1 tablet (5 mg total) by mouth 2 (two) times daily.  ? atorvastatin (LIPITOR) 40 MG tablet Take 1 tablet (40 mg total) by mouth daily.  ? clopidogrel (PLAVIX) 75 MG tablet Take 1 tablet (75 mg total) by mouth daily.  ? Continuous Blood Gluc Receiver (DEXCOM G6 RECEIVER) DEVI Check CBGs as directed  ? Continuous Blood Gluc Sensor (DEXCOM G6 SENSOR) MISC Apply one sensor every 10 days.  ? Continuous Blood Gluc Transmit (DEXCOM G6 TRANSMITTER) MISC Apply to sensor every 90 days  ? diltiazem (CARDIZEM CD) 120 MG 24 hr capsule TAKE 1 CAPSULE BY MOUTH EVERY DAY  ? furosemide (LASIX) 80 MG tablet Take 1 tablet (80 mg total) by mouth daily.  ? Glycopyrrolate-Formoterol (BEVESPI AEROSPHERE) 9-4.8 MCG/ACT AERO TAKE 2 PUFFS BY MOUTH TWICE A DAY  ? hydrALAZINE (APRESOLINE) 50 MG tablet Take 1 tablet (50 mg total) by mouth 3 (three) times daily.  ? levalbuterol (XOPENEX HFA) 45 MCG/ACT inhaler INHALE 1 TO 2 PUFFS BY MOUTH EVERY 6 HOURS AS NEEDED FOR WHEEZE  ? metFORMIN (GLUCOPHAGE) 1000 MG tablet Take 1 tablet by mouth twice daily.  ? metoprolol tartrate (LOPRESSOR) 100 MG tablet Take 1 tablet (100 mg total) by mouth 2 (two) times daily.  ? nitroGLYCERIN (NITROSTAT) 0.4 MG SL tablet Place 1 tablet (0.4 mg total) under the tongue every 5 (five) minutes as needed for chest pain. X 3 doses  ? polyethylene glycol (MIRALAX / GLYCOLAX) packet Take 17 g by mouth daily as needed for mild constipation.  ? TRESIBA FLEXTOUCH 100 UNIT/ML  FlexTouch Pen INJECT 32 UNITS INTO THE SKIN AT BEDTIME.  ? ?No facility-administered encounter medications on file as of 04/09/2021.  ?Reviewed chart prior to disease state call. Spoke with patient regarding BP ? ?Recent Office Vitals: ?BP Readings from Last 3 Encounters:  ?02/10/21 (!) 150/100  ?10/09/20 (!) 150/102  ?09/23/20 140/82  ? ?Pulse Readings from Last 3 Encounters:  ?02/10/21 (!) 110  ?10/09/20 91  ?08/20/20 (!) 55  ?  ?Wt Readings from Last 3 Encounters:  ?02/10/21 169 lb 3.2 oz (76.7 kg)  ?10/09/20 175 lb 12.8 oz (79.7 kg)  ?08/20/20 173 lb 9.6 oz (78.7 kg)  ?  ? ?Kidney Function ?Lab Results  ?Component Value Date/Time  ? CREATININE 1.43 02/10/2021 02:04 PM  ? CREATININE 1.36 (H) 10/09/2020 11:13 AM  ? GFR 46.36 (L) 02/10/2021 02:04 PM  ? GFRNONAA 59 (L) 05/12/2020 06:38 AM  ? GFRAA 56 (L) 03/06/2019 02:04 PM  ? ? ?BMP Latest Ref Rng & Units 02/10/2021 10/09/2020 08/02/2020  ?Glucose 70 - 99 mg/dL 96 81 139(H)  ?BUN 6 - 23 mg/dL '23 18 24  '$ ?Creatinine 0.40 - 1.50 mg/dL 1.43 1.36(H) 1.61(H)  ?BUN/Creat Ratio 10 - 24 - 13 15  ?Sodium 135 - 145 mEq/L 143 144 145(H)  ?Potassium 3.5 - 5.1 mEq/L 3.6 4.0 4.0  ?Chloride 96 - 112 mEq/L 103 102 101  ?CO2 19 - 32 mEq/L 33(H) 28 27  ?Calcium 8.4 -  10.5 mg/dL 9.8 9.7 10.0  ? ? ?Current antihypertensive regimen:  ?Diltiazem 120 mg 1 capsule daily - Appropriate, Query effective, Safe, Accessible ?Hydralazine 50 mg 1 tablet three times daily - Appropriate, Query effective, Safe, Accessible ?Metoprolol tartrate 100 mg 1 tablet twice daily - Appropriate, Query effective, Safe, Accessible ?How often are you checking your Blood Pressure? 3-5x per week ?Current home BP readings: 135/75 per wife patient not complaining of any hyper/hypotensive symptoms. ?What recent interventions/DTPs have been made by any provider to improve Blood Pressure control since last CPP Visit: Wife reports none ?Any recent hospitalizations or ED visits since last visit with CPP? No ?What diet changes have  been made to improve Blood Pressure Control?  ?Wife reports watches and not adding sodium to meals ? ?Adherence Review: ?Is the patient currently on ACE/ARB medication? No ?Does the patient have >5 day gap between last estimated fill dates? No ? ? ?Care Gaps: ?Bp - 135/75 ( 04/09/21 home) ?Eye Exam - Overdue ?Foot Exam- Overdue ?COVID Booster - Overdue ?CCM- 6/23 ?AWV- office aware to sched as of 8/22 ? ? ?Star Rating Drugs: ?Metformin (Glucophage) 1000 mg - [facility] ?Atorvastatin (Lipitor) 40 mg - Last filled [facility]/15/2021- [facility] ?(Verified as accurate) ? ? ?Ned Clines CMA ?Clinical Pharmacist Assistant ?[phone] ? ?

## 2021-04-14 ENCOUNTER — Encounter: Payer: Self-pay | Admitting: Cardiology

## 2021-04-14 ENCOUNTER — Other Ambulatory Visit: Payer: Self-pay | Admitting: Internal Medicine

## 2021-04-14 ENCOUNTER — Ambulatory Visit (INDEPENDENT_AMBULATORY_CARE_PROVIDER_SITE_OTHER): Payer: Medicare Other | Admitting: Cardiology

## 2021-04-14 ENCOUNTER — Other Ambulatory Visit: Payer: Self-pay

## 2021-04-14 VITALS — BP 158/82 | HR 74 | Ht 69.0 in | Wt 171.8 lb

## 2021-04-14 DIAGNOSIS — N183 Chronic kidney disease, stage 3 unspecified: Secondary | ICD-10-CM

## 2021-04-14 DIAGNOSIS — I5032 Chronic diastolic (congestive) heart failure: Secondary | ICD-10-CM

## 2021-04-14 DIAGNOSIS — Z8679 Personal history of other diseases of the circulatory system: Secondary | ICD-10-CM | POA: Diagnosis not present

## 2021-04-14 DIAGNOSIS — I4821 Permanent atrial fibrillation: Secondary | ICD-10-CM | POA: Diagnosis not present

## 2021-04-14 DIAGNOSIS — I10 Essential (primary) hypertension: Secondary | ICD-10-CM | POA: Diagnosis not present

## 2021-04-14 DIAGNOSIS — I2510 Atherosclerotic heart disease of native coronary artery without angina pectoris: Secondary | ICD-10-CM | POA: Diagnosis not present

## 2021-04-14 DIAGNOSIS — E785 Hyperlipidemia, unspecified: Secondary | ICD-10-CM

## 2021-04-14 DIAGNOSIS — Z9889 Other specified postprocedural states: Secondary | ICD-10-CM

## 2021-04-23 DIAGNOSIS — E785 Hyperlipidemia, unspecified: Secondary | ICD-10-CM | POA: Diagnosis not present

## 2021-04-23 DIAGNOSIS — N1831 Chronic kidney disease, stage 3a: Secondary | ICD-10-CM | POA: Diagnosis not present

## 2021-04-23 DIAGNOSIS — I129 Hypertensive chronic kidney disease with stage 1 through stage 4 chronic kidney disease, or unspecified chronic kidney disease: Secondary | ICD-10-CM | POA: Diagnosis not present

## 2021-04-23 DIAGNOSIS — I4891 Unspecified atrial fibrillation: Secondary | ICD-10-CM | POA: Diagnosis not present

## 2021-04-23 DIAGNOSIS — I5030 Unspecified diastolic (congestive) heart failure: Secondary | ICD-10-CM | POA: Diagnosis not present

## 2021-04-23 DIAGNOSIS — I2510 Atherosclerotic heart disease of native coronary artery without angina pectoris: Secondary | ICD-10-CM | POA: Diagnosis not present

## 2021-04-23 DIAGNOSIS — E1122 Type 2 diabetes mellitus with diabetic chronic kidney disease: Secondary | ICD-10-CM | POA: Diagnosis not present

## 2021-04-23 DIAGNOSIS — J449 Chronic obstructive pulmonary disease, unspecified: Secondary | ICD-10-CM | POA: Diagnosis not present

## 2021-04-23 DIAGNOSIS — N189 Chronic kidney disease, unspecified: Secondary | ICD-10-CM | POA: Diagnosis not present

## 2021-04-24 DIAGNOSIS — N1831 Chronic kidney disease, stage 3a: Secondary | ICD-10-CM | POA: Diagnosis not present

## 2021-04-28 LAB — CBC: RBC: 4.87 (ref 3.87–5.11)

## 2021-04-28 LAB — CBC AND DIFFERENTIAL
HCT: 41 (ref 41–53)
Hemoglobin: 13.6 (ref 13.5–17.5)
WBC: 10.2

## 2021-04-28 LAB — COMPREHENSIVE METABOLIC PANEL
Calcium: 9.9 (ref 8.7–10.7)
eGFR: 44

## 2021-04-28 LAB — IRON,TIBC AND FERRITIN PANEL
%SAT: 19
Ferritin: 48
Iron: 69
TIBC: 368
UIBC: 299

## 2021-04-28 LAB — HEPATIC FUNCTION PANEL
ALT: 16 U/L (ref 10–40)
AST: 29 (ref 14–40)
Bilirubin, Total: 0.5

## 2021-04-28 LAB — BASIC METABOLIC PANEL
BUN: 26 — AB (ref 4–21)
Creatinine: 1.6 — AB (ref 0.6–1.3)
Glucose: 93
Potassium: 3.7 mEq/L (ref 3.5–5.1)
Sodium: 141 (ref 137–147)

## 2021-05-01 ENCOUNTER — Encounter: Payer: Self-pay | Admitting: Internal Medicine

## 2021-05-01 NOTE — Progress Notes (Signed)
? ? ?Chronic Care Management ?Pharmacy Assistant  ? ?Name: Jason Fisher  MRN: [id number] DOB: 1940-10-14 ? ?Reason for Encounter: Medication Review Medication Coordination ?  ?Recent office visits:  ?None  ? ?Recent consult visits:  ?04/14/21 Martinique, Peter M, MD (Cardiology) - Patient presented for Permanent atrial fibrillation and other concerns.No medication changes. ? ?Hospital visits:  ?None in previous 6 months ? ?Medications: ?Outpatient Encounter Medications as of 04/09/2021  ?Medication Sig  ? ACCU-CHEK AVIVA PLUS test strip CHECK BLOOD SUGAR ONCE DAILY. DX E11.51  ? apixaban (ELIQUIS) 5 MG TABS tablet Take 1 tablet (5 mg total) by mouth 2 (two) times daily.  ? clopidogrel (PLAVIX) 75 MG tablet Take 1 tablet (75 mg total) by mouth daily.  ? Continuous Blood Gluc Receiver (DEXCOM G6 RECEIVER) DEVI Check CBGs as directed  ? Continuous Blood Gluc Sensor (DEXCOM G6 SENSOR) MISC Apply one sensor every 10 days.  ? Continuous Blood Gluc Transmit (DEXCOM G6 TRANSMITTER) MISC Apply to sensor every 90 days  ? diltiazem (CARDIZEM CD) 120 MG 24 hr capsule TAKE 1 CAPSULE BY MOUTH EVERY DAY  ? furosemide (LASIX) 80 MG tablet Take 1 tablet (80 mg total) by mouth daily.  ? Glycopyrrolate-Formoterol (BEVESPI AEROSPHERE) 9-4.8 MCG/ACT AERO TAKE 2 PUFFS BY MOUTH TWICE A DAY  ? hydrALAZINE (APRESOLINE) 50 MG tablet Take 1 tablet (50 mg total) by mouth 3 (three) times daily.  ? levalbuterol (XOPENEX HFA) 45 MCG/ACT inhaler INHALE 1 TO 2 PUFFS BY MOUTH EVERY 6 HOURS AS NEEDED FOR WHEEZE  ? metFORMIN (GLUCOPHAGE) 1000 MG tablet Take 1 tablet by mouth twice daily.  ? metoprolol tartrate (LOPRESSOR) 100 MG tablet Take 1 tablet (100 mg total) by mouth 2 (two) times daily.  ? nitroGLYCERIN (NITROSTAT) 0.4 MG SL tablet Place 1 tablet (0.4 mg total) under the tongue every 5 (five) minutes as needed for chest pain. X 3 doses  ? polyethylene glycol (MIRALAX / GLYCOLAX) packet Take 17 g by mouth daily as needed for mild constipation.  ?  TRESIBA FLEXTOUCH 100 UNIT/ML FlexTouch Pen INJECT 32 UNITS INTO THE SKIN AT BEDTIME.  ? [DISCONTINUED] atorvastatin (LIPITOR) 40 MG tablet Take 1 tablet (40 mg total) by mouth daily.  ? ?No facility-administered encounter medications on file as of 04/09/2021.  ?Reviewed chart for medication changes ahead of medication coordination call. ? ?No OVs, Consults, or hospital visits since last care coordination call/Pharmacist visit.  ? ?No medication changes indicated  ?BP Readings from Last 3 Encounters:  ?04/14/21 (!) 158/82  ?02/10/21 (!) 150/100  ?10/09/20 (!) 150/102  ?  ?Lab Results  ?Component Value Date  ? HGBA1C 6.7 (H) 02/10/2021  ?  ? ?Patient obtains medications through Adherence Packaging  30 Days  ? ?Patient is due for 1st  adherence delivery on: 05/13/21. ?Called patient and reviewed medications and coordinated delivery. ?Packs for 30 DS ? ?This delivery to include: ?Metformin (Glucophage)1000 mg : Take one tablet at Breakfast and at bedtime ?Hydralazine (Apresoline) 50 mg : Take one tablet at [employer] and Bedtime ?Eliquis 5 mg : Take one tablet at Breakfast and at Bedtime ?Furosemide  (Lasix) 80 mg : Take one tablet at Breakfast ?Metoprolol (Lopressor) 100 mg : Take one tablet at Breakfast and at [place] ?Clopidogrel (Plavix) 75 mg : Take one tablet at Breakfast ?Atorvastatin (Lipitor) 40 mg : Take one tablet at Breakfast ?Diltiazem (Cardizem) 120 mg : Take one tablet at Breakfast ?Added Bevespi Inhaler ? ?TRESIBA FLEXTOUCH 100 UNIT/ML 03/24/2021 45  ? ?LEVALBUTEROL  TAR HFA 45MCG INH 03/28/2021 25  ? ?DILTIAZEM 24H ER(CD) 120 MG CP 02/12/2021 90  ? ?Wife reports Changes to Hydralazine from Dr Kelvin Cellar [facility], call to the office at [phone] and requested prescription be sent over with corrected dosing. She also reports he started him on a new medication but was unsure of the name requested it be sent to [facility] as well. ? ?Patient declined the following medications: ?[name] , [facility] he reports he has 2 pens left  ? ?Confirmed delivery date of 05/13/21, advised patient that pharmacy will contact them the morning of delivery.  ? ?Care Gaps: ?Bp - 135/75 ( 04/09/21 home) ?Eye Exam - Overdue ?Foot Exam- Overdue ?COVID Booster - Overdue ?CCM- 6/23 ?AWV- office aware to sched as of 8/22 ?Lab Results  ?Component Value Date  ? HGBA1C 6.7 (H) 02/10/2021  ? ? ?Star Rating Drugs: ?Metformin (Glucophage) 1000 mg - Last filled 04/28/21 16 DS at [facility] ?Atorvastatin (Lipitor) 40 mg  ? ? ? ? ?Ned Clines CMA ?Clinical Pharmacist Assistant ?[phone] ? ?

## 2021-05-08 NOTE — Progress Notes (Signed)
Per [name] [facility] to [facility] all active prescriptions with the exception of Dexcom and its supplies [facility], spoke to [facility] she reports she has done the above. ? ? ? ?Ned Clines CMA ?Clinical Pharmacist Assistant ?[phone] ? ?

## 2021-05-13 DIAGNOSIS — I129 Hypertensive chronic kidney disease with stage 1 through stage 4 chronic kidney disease, or unspecified chronic kidney disease: Secondary | ICD-10-CM | POA: Diagnosis not present

## 2021-05-19 ENCOUNTER — Telehealth: Payer: Self-pay | Admitting: Pharmacist

## 2021-05-19 ENCOUNTER — Ambulatory Visit (INDEPENDENT_AMBULATORY_CARE_PROVIDER_SITE_OTHER): Payer: Medicare Other | Admitting: Internal Medicine

## 2021-05-19 VITALS — BP 120/70 | Temp 97.7°F | Wt 171.9 lb

## 2021-05-19 DIAGNOSIS — I482 Chronic atrial fibrillation, unspecified: Secondary | ICD-10-CM | POA: Diagnosis not present

## 2021-05-19 DIAGNOSIS — E1151 Type 2 diabetes mellitus with diabetic peripheral angiopathy without gangrene: Secondary | ICD-10-CM | POA: Diagnosis not present

## 2021-05-19 DIAGNOSIS — I2510 Atherosclerotic heart disease of native coronary artery without angina pectoris: Secondary | ICD-10-CM

## 2021-05-19 DIAGNOSIS — N1831 Chronic kidney disease, stage 3a: Secondary | ICD-10-CM

## 2021-05-19 DIAGNOSIS — I10 Essential (primary) hypertension: Secondary | ICD-10-CM | POA: Diagnosis not present

## 2021-05-19 DIAGNOSIS — E785 Hyperlipidemia, unspecified: Secondary | ICD-10-CM | POA: Diagnosis not present

## 2021-05-19 DIAGNOSIS — Z9861 Coronary angioplasty status: Secondary | ICD-10-CM | POA: Diagnosis not present

## 2021-05-19 LAB — POCT GLYCOSYLATED HEMOGLOBIN (HGB A1C): Hemoglobin A1C: 6.7 % — AB (ref 4.0–5.6)

## 2021-05-19 LAB — MICROALBUMIN / CREATININE URINE RATIO
Creatinine,U: 187.3 mg/dL
Microalb Creat Ratio: 8.5 mg/g (ref 0.0–30.0)
Microalb, Ur: 15.9 mg/dL — ABNORMAL HIGH (ref 0.0–1.9)

## 2021-05-19 NOTE — Progress Notes (Signed)
A user error has taken place: encounter opened in error, closed for administrative reasons.

## 2021-05-19 NOTE — Progress Notes (Signed)
? ? ? ?Established Patient Office Visit ? ? ? ? ?This visit occurred during the SARS-CoV-2 public health emergency.  Safety protocols were in place, including screening questions prior to the visit, additional usage of staff PPE, and extensive cleaning of exam room while observing appropriate contact time as indicated for disinfecting solutions.  ? ? ?CC/Reason for Visit: 37-monthfollow-up chronic medical conditions ? ?HPI: Jason KAHRSis a 81y.o. male who is coming in today for the above mentioned reasons. Past Medical History is significant for:  Hypertension, hyperlipidemia, AAA, COPD, A. fib, stage III chronic kidney disease, heart failure with preserved ejection fraction, type 2 diabetes, coronary artery disease.  He follows routinely with cardiology.  He has a known history of medication nonadherence, suspected due to cognitive deficiencies.  He is now being followed by our pharmacist and he has been sent to pill packs which will hopefully help with adherence.  He was started on olmesartan 10 mg daily by nephrologist and they decreased his hydralazine to 25 mg once daily.  He is otherwise doing well. ? ? ?Past Medical/Surgical History: ?Past Medical History:  ?Diagnosis Date  ? ABDOMINAL AORTIC ANEURYSM 07/13/2008  ? BENIGN PROSTATIC HYPERTROPHY, HX OF 02/24/2007  ? CAD S/P PCI   ? a. 2007 Inf STEMI s/p Vision BMS ? 2 to RCA;  b. 2009 ISR Prox RCA Stent-->with DES;  c. 09/2015 PCI: LM nl, LAD 30p/m, D1 90, LCX nl, OM1 small, 80, OM2 90 (2.5x14 Biofreedom stent), OM3 small, RCA 30p ISR, 178mSR, 4037m5d, EF 50-55%.  ? 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 Chronic atrial fibrillation (HCCKoloa0/06/2009  ? a. On Eliquis (CHA2DS2VASc = 5).  Rate controlled w/ BB and CCB.  ? Chronic diastolic CHF (congestive heart failure) (HCCCorn[place] ? a. 09/2015 Echo: EF 55-60%, mildly dil LA.  ? CKD (chronic kidney disease), stage III (HCCRoyal ? they mentioned this to the patient but they would work on it later after the aneurysm   ? COLONIC POLYPS, HX OF 11/02/2006  ? COPD (chronic obstructive pulmonary disease) ([place] ? DIABETES MELLITUS, TYPE II 10/29/2006  ? Diverticulosis   ? History of tobacco abuse   ? a. Quit 09/2015.  ? HYPERLIPIDEMIA 10/29/2006  ? Hypertension   ? Hypertensive heart disease with heart failure (HCC[place] de [place] ? Pneumonia 09/2015  ? /noArchie Endo29/2018  ? STEMI (ST elevation myocardial infarction) ([facility]  ? /notes 07/31/2016  ? ? ?Past Surgical History:  ?Procedure Laterality Date  ? ABDOMINAL AORTIC ENDOVASCULAR STENT GRAFT N/A 07/31/2016  ? Procedure: ABDOMINAL AORTIC ENDOVASCULAR STENT GRAFT;  Surgeon: BraSerafina MitchellD;  [facility] [place];  Laterality: N/A;  ? CARDIAC CATHETERIZATION N/A 09/26/2015  ? Procedure: Left Heart Cath and Coronary Angiography;  Surgeon: Peter M JorMartiniqueD;  Location[facility] [place] [facility];  Service: Cardiovascular;  Laterality: N/A;  ? CARDIAC CATHETERIZATION N/A 09/26/2015  ? Procedure: Coronary Stent Intervention;  Surgeon: Peter M JorMartiniqueD;  Location[facility] [place] [facility];  Service: Cardiovascular;  Laterality: N/A;  ? CATARACT EXTRACTION Bilateral   ? COLONOSCOPY    ? CORONARY ANGIOPLASTY WITH STENT PLACEMENT  2007  ? Inferior STEMI 2007 - RCA PCI with 2 Vision BMS; Abnormal Myoview in 2009 - pRCA ins-stent restenosis & unstented disease - DES PCI Promus 3.0 x 20 mm (3.5 mm)  ? ENDOVASCULAR REPAIR/STENT GRAFT  07/31/2016  ? INCISION AND DRAINAGE PERIRECTAL ABSCESS N/A 11/06/2015  ?  Procedure: IRRIGATION AND DEBRIDEMENT PERIRECTAL ABSCESS, POSSIBLE HEMORRHOIDECTOMY;  Surgeon: Coralie Keens, MD;  Location: [place];  Service: General;  Laterality: N/A;  ? ORIF SHOULDER FRACTURE Right   ? "fell out of back end of a truck"  ? ? ?Social History: ? reports that he has been smoking cigarettes. He has a 49.00 pack-year smoking history. He has never used smokeless tobacco. He reports that he does not drink alcohol and does not use drugs. ? ?Allergies: ?Allergies  ?Allergen Reactions  ?  Contrast Media [Iodinated Contrast Media] Anaphylaxis  ? ? ?Family History:  ?Family History  ?Problem Relation Age of Onset  ? Heart attack Mother   ? Diabetes Mother   ? Heart attack Father   ? Hypertension Father   ? Heart attack Brother   ? Diabetes Brother   ? ? ? ?Current Outpatient Medications:  ?  ACCU-CHEK AVIVA PLUS test strip, CHECK BLOOD SUGAR ONCE DAILY. DX E11.51, Disp: 100 strip, Rfl: 2 ?  apixaban (ELIQUIS) 5 MG TABS tablet, Take 1 tablet (5 mg total) by mouth 2 (two) times daily., Disp: 180 tablet, Rfl: 1 ?  atorvastatin (LIPITOR) 40 MG tablet, TAKE 1 TABLET BY MOUTH EVERY DAY, Disp: 90 tablet, Rfl: 1 ?  Continuous Blood Gluc Receiver (DEXCOM G6 RECEIVER) DEVI, Check CBGs as directed, Disp: 1 each, Rfl: 0 ?  Continuous Blood Gluc Sensor (DEXCOM G6 SENSOR) MISC, Apply one sensor every 10 days., Disp: 3 each, Rfl: 2 ?  Continuous Blood Gluc Transmit (DEXCOM G6 TRANSMITTER) MISC, Apply to sensor every 90 days, Disp: 1 each, Rfl: 0 ?  diltiazem (CARDIZEM CD) 120 MG 24 hr capsule, TAKE 1 CAPSULE BY MOUTH EVERY DAY, Disp: 90 capsule, Rfl: 1 ?  furosemide (LASIX) 80 MG tablet, Take 1 tablet (80 mg total) by mouth daily., Disp: 90 tablet, Rfl: 1 ?  Glycopyrrolate-Formoterol (BEVESPI AEROSPHERE) 9-4.8 MCG/ACT AERO, TAKE 2 PUFFS BY MOUTH TWICE A DAY, Disp: 10.7 g, Rfl: 5 ?  hydrALAZINE (APRESOLINE) 50 MG tablet, Take 1 tablet (50 mg total) by mouth 3 (three) times daily. (Patient taking differently: Take 50 mg by mouth. Half tab daily), Disp: 270 tablet, Rfl: 1 ?  levalbuterol (XOPENEX HFA) 45 MCG/ACT inhaler, INHALE 1 TO 2 PUFFS BY MOUTH EVERY 6 HOURS AS NEEDED FOR WHEEZE, Disp: 15 each, Rfl: 3 ?  metFORMIN (GLUCOPHAGE) 1000 MG tablet, Take 1 tablet by mouth twice daily., Disp: 180 tablet, Rfl: 1 ?  metoprolol tartrate (LOPRESSOR) 100 MG tablet, Take 1 tablet (100 mg total) by mouth 2 (two) times daily., Disp: 180 tablet, Rfl: 3 ?  nitroGLYCERIN (NITROSTAT) 0.4 MG SL tablet, Place 1 tablet (0.4 mg total)  under the tongue every 5 (five) minutes as needed for chest pain. X 3 doses, Disp: 25 tablet, Rfl: 4 ?  olmesartan (BENICAR) 5 MG tablet, Take 10 mg by mouth daily., Disp: , Rfl:  ?  polyethylene glycol (MIRALAX / GLYCOLAX) packet, Take 17 g by mouth daily as needed for mild constipation., Disp: , Rfl:  ?  TRESIBA FLEXTOUCH 100 UNIT/ML FlexTouch Pen, INJECT 32 UNITS INTO THE SKIN AT BEDTIME., Disp: 15 mL, Rfl: 2 ? ?Review of Systems:  ?Constitutional: Denies fever, chills, diaphoresis, appetite change and fatigue.  ?HEENT: Denies photophobia, eye pain, redness, hearing loss, ear pain, congestion, sore throat, rhinorrhea, sneezing, mouth sores, trouble swallowing, neck pain, neck stiffness and tinnitus.   ?Respiratory: Denies cough, chest tightness,  and wheezing.   ?Cardiovascular: Denies chest pain, palpitations and leg swelling.  ?  Gastrointestinal: Denies nausea, vomiting, abdominal pain, diarrhea, constipation, blood in stool and abdominal distention.  ?Genitourinary: Denies dysuria, urgency, frequency, hematuria, flank pain and difficulty urinating.  ?Endocrine: Denies: hot or cold intolerance, sweats, changes in hair or nails, polyuria, polydipsia. ?Musculoskeletal: Denies myalgias, back pain, joint swelling, arthralgias and gait problem.  ?Skin: Denies pallor, rash and wound.  ?Neurological: Denies dizziness, seizures, syncope, weakness, light-headedness, numbness and headaches.  ?Hematological: Denies adenopathy. Easy bruising, personal or family bleeding history  ?Psychiatric/Behavioral: Denies suicidal ideation, mood changes, confusion, nervousness, sleep disturbance and agitation ? ? ? ?Physical Exam: ?Vitals:  ? 05/19/21 1331  ?BP: 120/70  ?Temp: 97.7 ?F (36.5 ?C)  ?TempSrc: Oral  ?Weight: 171 lb 14.4 oz (78 kg)  ? ? ?Body mass index is 25.39 kg/m?. ? ? ?Constitutional: NAD, calm, comfortable ?Eyes: PERRL, lids and conjunctivae normal, wears corrective lenses ?ENMT: Mucous membranes are moist.   ?Respiratory: clear to auscultation bilaterally, no wheezing, no crackles. Normal respiratory effort. No accessory muscle use.  ?Cardiovascular: Regular rate and irregular rhythm, no murmurs / rubs / gallops. No extre

## 2021-06-02 ENCOUNTER — Telehealth: Payer: Self-pay | Admitting: Pharmacist

## 2021-06-02 NOTE — Chronic Care Management (AMB) (Addendum)
? ? ?Chronic Care Management ?Pharmacy Assistant  ? ?Name: Jason Fisher  MRN: [id number] DOB: [birth year] ? ?Reason for Encounter: Medication Coordination  ?  ?Recent office visits:  ?06/02/21 [name], [name], MD - Patient presented for Essential hypertension and other concerns. Stopped Clopidogrel ? ?Recent consult visits:  ?04/14/21 Martinique, Peter M, MD (Cardiology) - Patient presented for Permanent atrial fibrillation and other concerns. No medication changes. ? ?Hospital visits:  ?None in previous 6 months ? ?Medications: ?Outpatient Encounter Medications as of 06/02/2021  ?Medication Sig  ? ACCU-CHEK AVIVA PLUS test strip CHECK BLOOD SUGAR ONCE DAILY. DX E11.51  ? apixaban (ELIQUIS) 5 MG TABS tablet Take 1 tablet (5 mg total) by mouth 2 (two) times daily.  ? atorvastatin (LIPITOR) 40 MG tablet TAKE 1 TABLET BY MOUTH EVERY DAY  ? Continuous Blood Gluc Receiver (DEXCOM G6 RECEIVER) DEVI Check CBGs as directed  ? Continuous Blood Gluc Sensor (DEXCOM G6 SENSOR) MISC Apply one sensor every 10 days.  ? Continuous Blood Gluc Transmit (DEXCOM G6 TRANSMITTER) MISC Apply to sensor every 90 days  ? diltiazem (CARDIZEM CD) 120 MG 24 hr capsule TAKE 1 CAPSULE BY MOUTH EVERY DAY  ? furosemide (LASIX) 80 MG tablet Take 1 tablet (80 mg total) by mouth daily.  ? Glycopyrrolate-Formoterol (BEVESPI AEROSPHERE) 9-4.8 MCG/ACT AERO TAKE 2 PUFFS BY MOUTH TWICE A DAY  ? hydrALAZINE (APRESOLINE) 50 MG tablet Take 1 tablet (50 mg total) by mouth 3 (three) times daily. (Patient taking differently: Take 50 mg by mouth. Half tab daily)  ? levalbuterol (XOPENEX HFA) 45 MCG/ACT inhaler INHALE 1 TO 2 PUFFS BY MOUTH EVERY 6 HOURS AS NEEDED FOR WHEEZE  ? metFORMIN (GLUCOPHAGE) 1000 MG tablet Take 1 tablet by mouth twice daily.  ? metoprolol tartrate (LOPRESSOR) 100 MG tablet Take 1 tablet (100 mg total) by mouth 2 (two) times daily.  ? nitroGLYCERIN (NITROSTAT) 0.4 MG SL tablet Place 1 tablet (0.4 mg total) under the tongue every 5 (five)  minutes as needed for chest pain. X 3 doses  ? olmesartan (BENICAR) 5 MG tablet Take 10 mg by mouth daily.  ? polyethylene glycol (MIRALAX / GLYCOLAX) packet Take 17 g by mouth daily as needed for mild constipation.  ? TRESIBA FLEXTOUCH 100 UNIT/ML FlexTouch Pen INJECT 32 UNITS INTO THE SKIN AT BEDTIME.  ? ?No facility-administered encounter medications on file as of 06/02/2021.  ?Reviewed chart for medication changes ahead of medication coordination call. ? ?No OVs, Consults, or hospital visits since last care coordination  ? ?No medication changes indicated  ? ?BP Readings from Last 3 Encounters:  ?05/19/21 120/70  ?04/14/21 (!) 158/82  ?02/10/21 (!) 150/100  ?  ?Lab Results  ?Component Value Date  ? HGBA1C 6.7 (A) 05/19/2021  ?  ? ?Patient obtains medications through Adherence Packaging  30 Days  ? ?Last adherence delivery included:  ?Metformin (Glucophage)1000 mg : Take one tablet at Breakfast and at bedtime ?Hydralazine (Apresoline) 50 mg : Take one tablet at [employer] and Bedtime ?Eliquis 5 mg : Take one tablet at Breakfast and at Bedtime ?Furosemide  (Lasix) 80 mg : Take one tablet at Breakfast ?Metoprolol (Lopressor) 100 mg : Take one tablet at Breakfast and at [place] ?Clopidogrel (Plavix) 75 mg : Take one tablet at Breakfast ?Atorvastatin (Lipitor) 40 mg : Take one tablet at Breakfast ?Diltiazem (Cardizem) 120 mg : Take one tablet at Breakfast ?Added Bevespi Inhaler ?Patient declined the following medications: ?[name] , [facility] he  reports he has 2 pens left  ? ? ?Patient is due for next adherence delivery on: 06/12/21. ?Called patient and reviewed medications and coordinated delivery. ?Packs for 30 DS ? ?This delivery to include: ?Metformin (Glucophage)1000 mg : Take one tablet at Breakfast and at bedtime ?Hydralazine (Apresoline) 25 mg : Take one tablet at Breakfast  ?Eliquis 5 mg : Take one tablet at Breakfast and at Bedtime ?Furosemide  (Lasix) 80 mg : Take one tablet at  Breakfast ?Metoprolol (Lopressor) 100 mg : Take one tablet at Breakfast and at [place] ?Atorvastatin (Lipitor) 40 mg : Take one tablet at Breakfast ?Diltiazem (Cardizem) 120 mg : Take one tablet at Breakfast ?Bevespi Inhaler ?Olmesartan 5 mg: Take 2 tabs(10 mg) daily at breakfast ? ?  TRESIBA FLEXTOUCH 100 UNIT/ML ?NITROGLYCERIN 0.4 MG TABLET SL 09/24/2020 30  ? ? ? ? ?Patient will need a short fill of Tresiba acute form done for delivery on 5/3 or 5/4 patient has one dose left. ? ?Patient needs refills for Nitroglycerin as his are old added to form ? ?Confirmed delivery date of 06/12/21., advised patient that pharmacy will contact them the morning of delivery.  ? ?Care Gaps: ?BP- 120/70 ( 06/02/21) ?AWV- 12/19 office aware to sched as of 8/22 ?CCM-6/23 ?Foot exam - Overdue ?Lab Results  ?Component Value Date  ? HGBA1C 6.7 (A) 05/19/2021  ? ? ?Star Rating Drugs: ?Metformin (Glucophage) 1000 mg - Last filled 05/10/21 30 DS at [facility] ?Atorvastatin (Lipitor) 40 mg Last filled 05/08/21 30 DS at [facility] ? ? ? ?Ned Clines CMA ?Clinical Pharmacist Assistant ?[phone] ? ?

## 2021-06-09 ENCOUNTER — Telehealth: Payer: Self-pay

## 2021-06-09 DIAGNOSIS — I482 Chronic atrial fibrillation, unspecified: Secondary | ICD-10-CM

## 2021-06-09 MED ORDER — NITROGLYCERIN 0.4 MG SL SUBL: 0.4000 mg | SUBLINGUAL_TABLET | SUBLINGUAL | 4 refills | Status: DC | PRN

## 2021-06-09 NOTE — Telephone Encounter (Signed)
-----   Message from Viona Gilmore, [facility] sent at 06/09/2021  8:37 AM EDT ----- ?Regarding: Nitroglycerin refill ?Hi, ? ?Mr. Jason Fisher is out of nitroglycerin tablets. Can you please send a refill to [facility] pharmacy? ? ?Thanks! ?Maddie ? ?

## 2021-06-11 ENCOUNTER — Telehealth: Payer: Self-pay | Admitting: Internal Medicine

## 2021-06-11 NOTE — Telephone Encounter (Signed)
Pt wife is calling and would like to know which blood thinner md took pt off of ?

## 2021-06-11 NOTE — Telephone Encounter (Signed)
Spoke to pt's wife Leda Gauze, told her I do not see any changes to his medication list when he was here last. She asked about Plavix? Told her I do not see that medication I have he is suppose to be taking Eliquis 5 mg twice a day for blood thinner. Leda Gauze verbalized understanding. ?

## 2021-07-01 ENCOUNTER — Telehealth: Payer: Self-pay | Admitting: Pharmacist

## 2021-07-01 NOTE — Chronic Care Management (AMB) (Unsigned)
Chronic Care Management Pharmacy Assistant   Name: Jason Fisher  MRN: [id number] DOB: 01/17/1941  Reason for Encounter: Medication Review/ Medication Coordination   Recent office visits:  None  Recent consult visits:  None  Hospital visits:  None in previous 6 months  Medications: Outpatient Encounter Medications as of 07/01/2021  Medication Sig   ACCU-CHEK AVIVA PLUS test strip CHECK BLOOD SUGAR ONCE DAILY. DX E11.51   apixaban (ELIQUIS) 5 MG TABS tablet Take 1 tablet (5 mg total) by mouth 2 (two) times daily.   atorvastatin (LIPITOR) 40 MG tablet TAKE 1 TABLET BY MOUTH EVERY DAY   Continuous Blood Gluc Receiver (DEXCOM G6 RECEIVER) DEVI Check CBGs as directed   Continuous Blood Gluc Sensor (DEXCOM G6 SENSOR) MISC Apply one sensor every 10 days.   Continuous Blood Gluc Transmit (DEXCOM G6 TRANSMITTER) MISC Apply to sensor every 90 days   diltiazem (CARDIZEM CD) 120 MG 24 hr capsule TAKE 1 CAPSULE BY MOUTH EVERY DAY   furosemide (LASIX) 80 MG tablet Take 1 tablet (80 mg total) by mouth daily.   Glycopyrrolate-Formoterol (BEVESPI AEROSPHERE) 9-4.8 MCG/ACT AERO TAKE 2 PUFFS BY MOUTH TWICE A DAY   hydrALAZINE (APRESOLINE) 50 MG tablet Take 1 tablet (50 mg total) by mouth 3 (three) times daily. (Patient taking differently: Take 50 mg by mouth. Half tab daily)   levalbuterol (XOPENEX HFA) 45 MCG/ACT inhaler INHALE 1 TO 2 PUFFS BY MOUTH EVERY 6 HOURS AS NEEDED FOR WHEEZE   metFORMIN (GLUCOPHAGE) 1000 MG tablet Take 1 tablet by mouth twice daily.   metoprolol tartrate (LOPRESSOR) 100 MG tablet Take 1 tablet (100 mg total) by mouth 2 (two) times daily.   nitroGLYCERIN (NITROSTAT) 0.4 MG SL tablet Place 1 tablet (0.4 mg total) under the tongue every 5 (five) minutes as needed for chest pain. X 3 doses   olmesartan (BENICAR) 5 MG tablet Take 10 mg by mouth daily.   polyethylene glycol (MIRALAX / GLYCOLAX) packet Take 17 g by mouth daily as needed for mild constipation.   TRESIBA  FLEXTOUCH 100 UNIT/ML FlexTouch Pen INJECT 32 UNITS INTO THE SKIN AT BEDTIME.   No facility-administered encounter medications on file as of 07/01/2021.  Reviewed chart for medication changes ahead of medication coordination call.  No OVs, Consults, or hospital visits since last care coordination call/Pharmacist visit.  No medication changes indicated.  BP Readings from Last 3 Encounters:  05/19/21 120/70  04/14/21 (!) 158/82  02/10/21 (!) 150/100    Lab Results  Component Value Date   HGBA1C 6.7 (A) 05/19/2021     Patient obtains medications through Adherence Packaging  30 Days   Last adherence delivery included: (medication name and frequency) Metformin (Glucophage)1000 mg : Take one tablet at Breakfast and at bedtime Hydralazine (Apresoline) 25 mg : Take one tablet at Breakfast  Eliquis 5 mg : Take one tablet at Breakfast and at Bedtime Furosemide  (Lasix) 80 mg : Take one tablet at Breakfast Metoprolol (Lopressor) 100 mg : Take one tablet at Breakfast and at [place] Atorvastatin (Lipitor) 40 mg : Take one tablet at Breakfast Diltiazem (Cardizem) 120 mg : Take one tablet at Breakfast Bevespi Inhaler Olmesartan 5 mg: Take 2 tabs(10 mg) daily at breakfast                           TRESIBA FLEXTOUCH 100 UNIT/ML NITROGLYCERIN 0.4 MG TABLET SL 09/24/2020 30          Patient  will need a short fill of Tresiba acute form done for delivery on 5/3 or 5/4 patient has one dose left.   Patient needs refills for Nitroglycerin as his are old added to form    Patient is due for next adherence delivery on: 07/11/21. Called patient and reviewed medications and coordinated delivery. Packs 30 DS  This delivery to include: Metformin (Glucophage)1000 mg : Take one tablet at Breakfast and at bedtime Hydralazine (Apresoline) 25 mg : Take one tablet at Breakfast  Eliquis 5 mg : Take one tablet at Breakfast and at Bedtime Furosemide  (Lasix) 80 mg : Take one tablet at Breakfast Metoprolol  (Lopressor) 100 mg : Take one tablet at Breakfast and at Dinner Atorvastatin (Lipitor) 40 mg : Take one tablet at Breakfast Diltiazem (Cardizem) 120 mg : Take one tablet at Breakfast Bevespi Inhaler Olmesartan 5 mg: Take 2 tabs(10 mg) daily at breakfast Nitroglycerin 0.4 mg Tresiba FlexTouch U-100 insulin 100 unit/mL (3 mL) subcutaneous pen 06/04/2021 29     Patient declined the following medications  Nitroglycerin 0.4 mg was sent a new vial last delivery is PRN   Confirmed delivery date of 07/11/21, advised patient that pharmacy will contact them the morning of delivery.   Care Gaps: Foot Exam - Overdue COVID Booster - Overdue Zoster Vaccine - Postponed Eye Exam - Postponed CCM- 6/23 AWV-12/19 office aware to sched as of 8/22 BP- 120/70 ( 05/19/21) Lab Results  Component Value Date   HGBA1C 6.7 (A) 05/19/2021    Star Rating Drugs: Metformin (Glucophage) 1000 mg - Last filled 06/06/21 30 DS at [facility] Atorvastatin (Lipitor) 40 mg Last filled 06/06/21 30 DS at [facility] Pharmacist Assistant [phone]

## 2021-07-15 ENCOUNTER — Telehealth: Payer: Self-pay | Admitting: Pharmacist

## 2021-07-15 NOTE — Chronic Care Management (AMB) (Signed)
    Chronic Care Management Pharmacy Assistant   Name: Jason Fisher  MRN: [id number] DOB: 30-Dec-1940  07/15/21 APPOINTMENT REMINDER   Called Patient No answer, left message of appointment on 07/16/21 at 1 via telephone visit with [name], Pharm D.   Notified to have all medications, supplements, blood pressure and/or blood sugar logs available during appointment and to return call if need to reschedule.  Care Gaps: Zoster Vaccine - Overdue Foot Exam - Overdue COVID Booster - Overdue Eye Exam - Postponed BP- 120/70 AWV- 12/19 office aware to sched as of 8/22 Lab Results  Component Value Date   HGBA1C 6.7 (A) 05/19/2021    Star Rating Drug: Metformin (Glucophage) 1000 mg - Last filled 07/11/21 30 DS at [facility] Atorvastatin (Lipitor) 40 mg Last filled 07/11/21 30 DS at [facility]    Medications: Outpatient Encounter Medications as of 07/15/2021  Medication Sig   ACCU-CHEK AVIVA PLUS test strip CHECK BLOOD SUGAR ONCE DAILY. DX E11.51   apixaban (ELIQUIS) 5 MG TABS tablet Take 1 tablet (5 mg total) by mouth 2 (two) times daily.   atorvastatin (LIPITOR) 40 MG tablet TAKE 1 TABLET BY MOUTH EVERY DAY   Continuous Blood Gluc Receiver (DEXCOM G6 RECEIVER) DEVI Check CBGs as directed   Continuous Blood Gluc Sensor (DEXCOM G6 SENSOR) MISC Apply one sensor every 10 days.   Continuous Blood Gluc Transmit (DEXCOM G6 TRANSMITTER) MISC Apply to sensor every 90 days   diltiazem (CARDIZEM CD) 120 MG 24 hr capsule TAKE 1 CAPSULE BY MOUTH EVERY DAY   furosemide (LASIX) 80 MG tablet Take 1 tablet (80 mg total) by mouth daily.   Glycopyrrolate-Formoterol (BEVESPI AEROSPHERE) 9-4.8 MCG/ACT AERO TAKE 2 PUFFS BY MOUTH TWICE A DAY   hydrALAZINE (APRESOLINE) 50 MG tablet Take 1 tablet (50 mg total) by mouth 3 (three) times daily. (Patient taking differently: Take 50 mg by mouth. Half tab daily)   levalbuterol (XOPENEX HFA) 45 MCG/ACT inhaler INHALE 1 TO 2 PUFFS BY MOUTH EVERY 6 HOURS AS NEEDED FOR  WHEEZE   metFORMIN (GLUCOPHAGE) 1000 MG tablet Take 1 tablet by mouth twice daily.   metoprolol tartrate (LOPRESSOR) 100 MG tablet Take 1 tablet (100 mg total) by mouth 2 (two) times daily.   nitroGLYCERIN (NITROSTAT) 0.4 MG SL tablet Place 1 tablet (0.4 mg total) under the tongue every 5 (five) minutes as needed for chest pain. X 3 doses   olmesartan (BENICAR) 5 MG tablet Take 10 mg by mouth daily.   polyethylene glycol (MIRALAX / GLYCOLAX) packet Take 17 g by mouth daily as needed for mild constipation.   TRESIBA FLEXTOUCH 100 UNIT/ML FlexTouch Pen INJECT 32 UNITS INTO THE SKIN AT BEDTIME.   No facility-administered encounter medications on file as of 07/15/2021.      [place] Clinical Pharmacist Assistant [phone]

## 2021-07-16 ENCOUNTER — Ambulatory Visit (INDEPENDENT_AMBULATORY_CARE_PROVIDER_SITE_OTHER): Payer: Medicare Other | Admitting: Pharmacist

## 2021-07-16 DIAGNOSIS — I10 Essential (primary) hypertension: Secondary | ICD-10-CM

## 2021-07-16 DIAGNOSIS — E1151 Type 2 diabetes mellitus with diabetic peripheral angiopathy without gangrene: Secondary | ICD-10-CM

## 2021-07-16 NOTE — Progress Notes (Signed)
Chronic Care Management Pharmacy Note  07/17/2021 Name:  Jason Fisher MRN:  [id number] DOB:  19-Jul-1940  Summary: BP is mostly at goal < 130/80 per home and office readings A1c at goal < 7% Pt is having difficulty affording his medications  Recommendations/Changes made from today's visit: -Recommended re-application of Dexcom due to risk of hypoglycemia and recurrent alarms -Filled out PAP for Bevespi and requested rx from Dr. Agustina Caroli office -Recommend repeat BMP to consider dose reduction for Eliquis  Plan: DM assessment in 2 months  Subjective: Jason Fisher is an 81 y.o. year old male who is a primary patient of Jason Fisher, Jason Halsted, MD.  The CCM team was consulted for assistance with disease management and care coordination needs.    Engaged with patient face to face for follow up visit in response to provider referral for pharmacy case management and/or care coordination services.   Consent to Services:  The patient was given information about Chronic Care Management services, agreed to services, and gave verbal consent prior to initiation of services.  Please see initial visit note for detailed documentation.   Patient Care Team: Jason Fisher, Jason Halsted, MD as PCP - General (Internal Medicine) Jason, Peter M, MD as PCP - Cardiology (Cardiology) Jason, Peter M, MD as Consulting Physician (Cardiology) Jason Mayer, MD as Consulting Physician (Gastroenterology) Jason Fisher, [facility] as Pharmacist (Pharmacist)  Recent office visits: 06/02/21 Jason Fisher, Jason Halsted, MD - Patient presented for Essential hypertension and other concerns. Stopped Clopidogrel.  02/11/20 Jason Fisher, Jason Halsted, MD - Patient presented for Essential hypertension. Follow up in 3 months.  Recent consult visits: 04/23/21 Jason Bumpers, MD (nephrology): Patient presented for CKD. Unable to access notes.  04/14/21 Jason, Peter M, MD (Cardiology) - Patient presented for Permanent  atrial fibrillation and other concerns. No medication changes.   06-20-2020 Jason Gobble, MD (Pulmonology) - Patient presented for Chronic bronchitis, unspecified chronic bronchitis type. No medication changes.  Hospital visits: Medication Reconciliation was completed by comparing discharge summary, patient's EMR and Pharmacy list, and upon discussion with patient.   Patient presented to [facility] on 05-10-2020 due to Acute exacerbation of CHF. Discharge date was 05-12-2020.      New?Medications Started at [facility] Discharge:?? -started  Diltiazem 120 MG 24 hr capsule- Take 1 capsule (120 mg total) by mouth daily.   Medication Changes at Hospital Discharge: -Changed  Eliquis 5 MG Tabs tablet- TAKE 1 TABLET BY MOUTH TWICE A DAY Metformin 1000 MG tablet - TAKE 1 TABLET TWICE DAILY WITH A MEAL   Medications Discontinued at Hospital Discharge: -Stopped  None   Medications that remain the same after Hospital Discharge:??  -All other medications will remain the same.       Objective:  Lab Results  Component Value Date   CREATININE 1.6 (A) 04/28/2021   BUN 26 (A) 04/28/2021   GFR 46.36 (L) 02/10/2021   GFRNONAA 59 (L) 05/12/2020   GFRAA 56 (L) 03/06/2019   NA 141 04/28/2021   K 3.7 04/28/2021   CALCIUM 9.9 04/28/2021   CO2 33 (H) 02/10/2021   GLUCOSE 96 02/10/2021    Lab Results  Component Value Date/Time   HGBA1C 6.7 (A) 05/19/2021 01:37 PM   HGBA1C 6.7 (H) 02/10/2021 02:04 PM   HGBA1C 7.1 (H) 08/02/2020 10:33 AM   GFR 46.36 (L) 02/10/2021 02:04 PM   GFR 54.45 (L) 12/21/2017 10:03 AM   MICROALBUR 15.9 (H) 05/19/2021 02:02 PM   MICROALBUR 3.7 (  H) 08/30/2015 08:21 AM    Last diabetic Eye exam:  Lab Results  Component Value Date/Time   HMDIABEYEEXA Retinopathy (A) 03/22/2017 12:27 PM    Last diabetic Foot exam:  Lab Results  Component Value Date/Time   HMDIABFOOTEX done 12/10/2011 12:00 AM     Lab Results  Component Value Date   CHOL 122 02/10/2021    HDL 36.70 (L) 02/10/2021   LDLCALC 66 02/10/2021   TRIG 94.0 02/10/2021   CHOLHDL 3 02/10/2021       Latest Ref Rng & Units 04/28/2021   12:00 AM 02/10/2021    2:04 PM 08/02/2020   10:33 AM  Hepatic Function  Total Protein 6.0 - 8.3 g/dL  7.9  7.6   Albumin 3.5 - 5.2 g/dL  3.9  4.5   AST 14 - 40 _0 ALT 10 - 40 U/L _1 Alk Phosphatase 39 - 117 U/L  75  91   Total Bilirubin 0.2 - 1.2 mg/dL  0.7  0.5   Bilirubin, Direct 0.00 - 0.40 mg/dL   0.19      This result is from an external source.    Lab Results  Component Value Date/Time   TSH 2.090 10/09/2020 11:13 AM   TSH 0.424 04/22/2016 03:55 AM   TSH 2.47 08/30/2015 08:19 AM   FREET4 1.12 01/14/2010 05:55 AM       Latest Ref Rng & Units 04/28/2021   12:00 AM 05/11/2020    6:12 AM 05/10/2020   11:26 PM  CBC  WBC  10.2     10.4  9.1   Hemoglobin 13.5 - 17.5 13.6     12.9  12.0   Hematocrit 41 - 53 41     40.7  37.6   Platelets 150 - 400 K/uL  194  168      This result is from an external source.    No results found for: "VD25OH"  Clinical ASCVD: Yes  The ASCVD Risk score (Arnett DK, et al., 2019) failed to calculate for the following reasons:   The 2019 ASCVD risk score is only valid for ages 19 to 44   The patient has a prior MI or stroke diagnosis       02/10/2021    1:24 PM 05/24/2020   11:51 AM 05/12/2019   10:32 AM  Depression screen PHQ 2/9  Decreased Interest 0 0 0  Down, Depressed, Hopeless 0 0 0  PHQ - 2 Score 0 0 0  Altered sleeping  0 0  Tired, decreased energy  0 0  Change in appetite  0 0  Feeling bad or failure about yourself   0 0  Trouble concentrating  0 0  Moving slowly or fidgety/restless  0 0  Suicidal thoughts  0 0  PHQ-9 Score  0 0  Difficult doing work/chores  Not difficult at all Not difficult at all     CHA2DS2/VAS Stroke Risk Points  Current as of 2 hours ago     6 >= 2 Points: High Risk  1 - 1.99 Points: Medium Risk  0 Points: Low Risk    Last Change: N/A       Details    This score determines the patient's risk of having a stroke if the  patient has atrial fibrillation.       Points Metrics  1 Has Congestive Heart Failure:  Yes  Current as of 2 hours ago  1 Has Vascular Disease:  Yes    Current as of 2 hours ago  1 Has Hypertension:  Yes    Current as of 2 hours ago  2 Age:  57    Current as of 2 hours ago  1 Has Diabetes:  Yes    Current as of 2 hours ago  0 Had Stroke:  No  Had TIA:  No  Had Thromboembolism:  No    Current as of 2 hours ago  0 Male:  No    Current as of 2 hours ago          Social History   Tobacco Use  Smoking Status Some Days   Packs/day: 1.00   Years: 49.00   Total pack years: 49.00   Types: Cigarettes   Last attempt to quit: 09/08/2015   Years since quitting: 5.8  Smokeless Tobacco Never  Tobacco Comments   passive smoker as well   BP Readings from Last 3 Encounters:  05/19/21 120/70  04/14/21 (!) 158/82  02/10/21 (!) 150/100   Pulse Readings from Last 3 Encounters:  04/14/21 74  02/10/21 (!) 110  10/09/20 91   Wt Readings from Last 3 Encounters:  05/19/21 171 lb 14.4 oz (78 kg)  04/14/21 171 lb 12.8 oz (77.9 kg)  02/10/21 169 lb 3.2 oz (76.7 kg)   BMI Readings from Last 3 Encounters:  05/19/21 25.39 kg/Fisher  04/14/21 25.37 kg/Fisher  02/10/21 25.73 kg/Fisher    Assessment/Interventions: Review of patient past medical history, allergies, medications, health status, including review of consultants reports, laboratory and other test data, was performed as part of comprehensive evaluation and provision of chronic care management services.   SDOH:  (Social Determinants of Health) assessments and interventions performed: No   SDOH Screenings   Alcohol Screen: Not on file  Depression (PHQ2-9): Low Risk  (02/10/2021)   Depression (PHQ2-9)    PHQ-2 Score: 0  Financial Resource Strain: Medium Risk (09/23/2020)   Overall Financial Resource Strain (CARDIA)    Difficulty of Paying Living Expenses:  Somewhat hard  Food Insecurity: Not on file  Housing: Not on file  Physical Activity: Sufficiently Active (06/08/2019)   Exercise Vital Sign    Days of Exercise per Week: 3 days    Minutes of Exercise per Session: 60 min  Social Connections: Not on file  Stress: Not on file  Tobacco Use: High Risk (04/14/2021)   Patient History    Smoking Tobacco Use: Some Days    Smokeless Tobacco Use: Never    Passive Exposure: Not on file  Transportation Needs: No Transportation Needs (09/23/2020)   PRAPARE - Transportation    Lack of Transportation (Medical): No    Lack of Transportation (Non-Medical): No     CCM Care Plan  Allergies  Allergen Reactions   Contrast Media [Iodinated Contrast Media] Anaphylaxis    Medications Reviewed Today     Reviewed by Jason Fisher, Jason Halsted, MD (Physician) on 05/19/21 at [place] List Status: <None>   Medication Order Taking? Sig Documenting Provider Last Dose Status Informant  ACCU-CHEK AVIVA PLUS test strip [id number] Yes CHECK BLOOD SUGAR ONCE DAILY. DX E11.51 Jason Fisher, Jason Halsted, MD Taking Active Self  apixaban (ELIQUIS) 5 MG TABS tablet [id number] Yes Take 1 tablet (5 mg total) by mouth 2 (two) times daily. Jason, Peter M, MD Taking Active   atorvastatin (LIPITOR) 40 MG tablet [id number] Yes TAKE 1 TABLET BY MOUTH EVERY  DAY Jason Fisher, Jason Halsted, MD Taking Active   clopidogrel (PLAVIX) 75 MG tablet [id number] Yes Take 1 tablet (75 mg total) by mouth daily. Jason Fisher, Jason Halsted, MD Taking Active   Continuous Blood Gluc Receiver ([place]) [place] [id number] Yes Check CBGs as directed Jason Fisher, Jason Halsted, MD Taking Active   Continuous Blood Gluc Sensor (DEXCOM G6 SENSOR) MISC [id number] Yes Apply one sensor every 10 days. Jason Fisher, Jason Halsted, MD Taking Active   Continuous Blood Gluc Transmit [facility] G6 TRANSMITTER) MISC [id number] Yes Apply to sensor every 90 days Jason Fisher, Jason Halsted, MD Taking Active    diltiazem [facility] CD) 120 MG 24 hr capsule [id number] Yes TAKE 1 CAPSULE BY MOUTH EVERY DAY Jason Fisher, Jason Halsted, MD Taking Active   furosemide (LASIX) 80 MG tablet [id number] Yes Take 1 tablet (80 mg total) by mouth daily. Jason Fisher, Jason Halsted, MD Taking Active   Glycopyrrolate-Formoterol (BEVESPI AEROSPHERE) 9-4.8 MCG/ACT AERO [id number] Yes TAKE 2 PUFFS BY MOUTH TWICE A DAY Jason Gobble, MD Taking Active   hydrALAZINE (APRESOLINE) 50 MG tablet [id number] Yes Take 1 tablet (50 mg total) by mouth 3 (three) times daily.  Patient taking differently: Take 50 mg by mouth. Half tab daily   Jason Fisher, Jason Halsted, MD Taking Active   levalbuterol [facility] HFA) 45 MCG/ACT inhaler [id number] Yes INHALE 1 TO 2 PUFFS BY MOUTH EVERY 6 HOURS AS NEEDED FOR WHEEZE Byrum, Rose Fillers, MD Taking Active   metFORMIN (GLUCOPHAGE) 1000 MG tablet [id number] Yes Take 1 tablet by mouth twice daily. Jason Fisher, Jason Halsted, MD Taking Active   metoprolol tartrate (LOPRESSOR) 100 MG tablet [id number] Yes Take 1 tablet (100 mg total) by mouth 2 (two) times daily. Jason, Peter M, MD Taking Active   nitroGLYCERIN (NITROSTAT) 0.4 MG SL tablet [id number] Yes Place 1 tablet (0.4 mg total) under the tongue every 5 (five) minutes as needed for chest pain. X 3 doses Jason Fisher, Jason Halsted, MD Taking Active   olmesartan [facility]) 5 MG tablet [id number] Yes Take 10 mg by mouth daily. [provider] Taking Active   polyethylene glycol Merril Abbe / GLYCOLAX) packet [id number] Yes Take 17 g by mouth daily as needed for mild constipation. [provider] Taking Active Self  TRESIBA FLEXTOUCH 100 UNIT/ML FlexTouch Pen [id number] Yes INJECT 32 UNITS INTO THE SKIN AT BEDTIME. Jason Fisher, Jason Halsted, MD Taking Active             Patient Active Problem List   Diagnosis Date Noted   Acute exacerbation of CHF (congestive heart failure) ([place]) 05/11/2020   Hyperglycemia due to diabetes mellitus ([place])  05/11/2020   Elevated brain natriuretic peptide (BNP) level 05/11/2020   Hypertensive urgency 05/11/2020   Prolonged QT interval 05/11/2020   Elevated troponin 04/22/2016   COPD (chronic obstructive pulmonary disease) ([place]) 02/07/2016   Abdominal aortic aneurysm (AAA) without rupture ([place])    Perirectal abscess 11/04/2015   Pulmonary nodules 11/04/2015   Coronary artery disease of bypass graft of native heart with stable angina pectoris ([place])    Allergic reaction to contrast dye 09/27/2015   Accelerated hypertension with diastolic congestive heart failure, NYHA class 3 ([place]) 09/27/2015   CHF (congestive heart failure) ([place]) 09/25/2015   CKD (chronic kidney disease), stage III (HCC)    Acute on chronic combined systolic and diastolic congestive heart failure (HCC)    Diastolic congestive heart failure (HCC)    Paroxysmal atrial fibrillation (HCC)    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): CHA2DS2Vasc = 5. On Warfarin. Rate control 11/06/2009   Occlusion and stenosis of carotid artery without mention of cerebral infarction 07/13/2008   Abdominal aortic aneurysm ([place]) 07/13/2008   BENIGN PROSTATIC HYPERTROPHY, HX OF 02/24/2007   MYOCARDIAL INFARCTION, HX OF 11/02/2006   CAD S/P percutaneous coronary angioplasty 11/02/2006   History of colonic polyps 11/02/2006   Dyslipidemia 10/29/2006   Essential hypertension 10/29/2006    Immunization History  Administered Date(s) Administered   Fluad Quad(high Dose 65+) 02/07/2020   Influenza, High Dose Seasonal PF 01/21/2015, 02/05/2016, 11/23/2016, 10/28/2017, 01/17/2021   Influenza,inj,Quad PF,6+ Mos 10/31/2013   Influenza-Unspecified 11/23/2016, 12/25/2018   PFIZER(Purple Top)SARS-COV-2 Vaccination 06/01/2019, 07/02/2019   Pneumococcal Conjugate-13 01/09/2013   Pneumococcal Polysaccharide-23 03/07/2014   Tetanus 07/31/2013    Conditions to be addressed/monitored:  Hypertension,  Hyperlipidemia, Diabetes, Atrial Fibrillation, Heart Failure, Coronary Artery Disease, COPD, and Tobacco use  Conditions addressed this visit: Diabetes, hypertension  Care Plan : [place]  Updates made by Jason Fisher, Fisher since 07/17/2021 12:00 AM     Problem: Problem: Hypertension, Hyperlipidemia, Diabetes, Atrial Fibrillation, Heart Failure, Coronary Artery Disease, COPD, and Tobacco use      Long-Range Goal: Patient-Specific Goal   Start Date: 09/23/2020  Expected End Date: 09/23/2021  Recent Progress: On track  Priority: High  Note:   Current Barriers:  Unable to independently afford treatment regimen Unable to achieve control of blood pressure  Unable to self administer medications as prescribed  Pharmacist Clinical Goal(s):  Patient will verbalize ability to afford treatment regimen achieve adherence to monitoring guidelines and medication adherence to achieve therapeutic efficacy achieve control of blood pressure as evidenced by home BP readings maintain control of cholesterol and diabetes as evidenced by lipid panel and A1c  through collaboration with PharmD and provider.   Interventions: 1:1 collaboration with Jason Fisher, Jason Halsted, MD regarding development and update of comprehensive plan of care as evidenced by provider attestation and co-signature Inter-disciplinary care team collaboration (see longitudinal plan of care) Comprehensive medication review performed; medication list updated in electronic medical record  Hypertension (BP goal <130/80) -Controlled -Current treatment: Diltiazem 120 mg 1 capsule daily - Appropriate, Effective, Safe, Accessible Hydralazine 50 mg 1 tablet twice daily - Appropriate, Effective, Safe, Accessible Metoprolol tartrate 100 mg 1 tablet twice daily - Appropriate, Effective, Safe, Accessible Olmesartan 10 mg 1 tablet daily  - Appropriate, Effective, Safe, Accessible -Medications previously tried: unknown   -Current home readings: 130/75 (checking daily with wrist cuff) -Current dietary habits: doesn't season with much salt; rinsing canned vegetables; not eating out often; recommended reading package labels -Current exercise habits: no structured exercise (out of breath walking to the mailbox) -Denies hypotensive/hypertensive symptoms -Educated on BP goals and benefits of medications for prevention of heart attack, stroke and kidney damage; Daily salt intake goal < 2300 mg; Exercise goal of 150 minutes per week; Importance of home blood pressure monitoring; Proper BP monitoring technique; -Counseled to monitor BP at home daily, document, and provide log at future appointments -Counseled on diet and exercise extensively Recommended to continue current medication  Hyperlipidemia: (LDL goal < 55) -Not ideally controlled -Current treatment: Atorvastatin 40 mg 1 tablet daily - Appropriate, Query effective, Safe, Accessible -Medications previously tried: none  -Current dietary patterns: doesn't fry foods often; uses vegetable or olive oil -Current exercise habits: no structured exercise  -Educated on Cholesterol goals;  Benefits of statin for ASCVD risk reduction;  Importance of limiting foods high in cholesterol; Exercise goal of 150 minutes per week; -Counseled on diet and exercise extensively Recommended to continue current medication  CAD (Goal: prevent heart events) -Controlled -Current treatment  Atorvastatin 40 mg 1 tablet daily - Appropriate, Query effective, Safe, Accessible Nitroglycerin 0.4 mg SL tablet as needed - Appropriate, Effective, Safe, Accessible Clopidogrel 75 mg 1 tablet daily - Appropriate, Effective, Safe, Accessible Metoprolol tartrate 100 mg 1 tablet twice daily - Appropriate, Effective, Safe, Accessible -Medications previously tried: none  -Recommended to continue current medication Counseled on importance of checking expiration date of nitroglycerin. Requested  a refill for patient as his is expired.    Diabetes (A1c goal <7%) -Controlled -Current medications: Tresiba 100 units/mL inject 32 units at bedtime - Appropriate, Effective, Query Safe, Accessible Metformin 1000 mg 1 tablet twice daily at meals - Appropriate, Effective, Safe, Accessible -Medications previously tried: unknown  -Current home glucose readings fasting glucose: not checking consistently post prandial glucose: patient stopped checking at home -Denies hypoglycemic/hyperglycemic symptoms -Current meal patterns:  breakfast: eggs and bacon or sausage OR biscuit and cheese  lunch: none  dinner: nuggets and mashed potatoes snacks: moonpie drinks: water, sweet tea, mt dew, coffee in AM -Current exercise: no structured exercise -Educated on A1c and blood sugar goals; Exercise goal of 150 minutes per week; Benefits of routine self-monitoring of blood sugar; Continuous glucose monitoring; Carbohydrate counting and/or plate method -Counseled to check feet daily and get yearly eye exams -Counseled on diet and exercise extensively Recommended to continue current medication Recommended for patient to restart using Dexcom to monitor blood sugars.  Atrial Fibrillation (Goal: prevent stroke and major bleeding) -Controlled -CHADSVASC: 6 -Current treatment: Rate control: Diltiazem 120 mg 1 capsule daily; Metoprolol tartrate 100 mg 1 tablet twice daily - Appropriate, Effective, Safe, Accessible Anticoagulation: Eliquis 5 mg 1 tablet twice daily - Appropriate, Effective, Query Safe, Accessible -Medications previously tried: none -Home BP and HR readings: could not provide  -Counseled on increased risk of stroke due to Afib and benefits of anticoagulation for stroke prevention; importance of adherence to anticoagulant exactly as prescribed; avoidance of NSAIDs due to increased bleeding risk with anticoagulants; -Counseled on diet and exercise extensively Recommended to continue  current medication  Heart Failure (Goal: manage symptoms and prevent exacerbations) -Controlled -Last ejection fraction: 50-55% (Date: 05/11/20) -HF type: Diastolic -NYHA Class: III (marked limitation of activity) -AHA HF Stage: C (Heart disease and symptoms present) -Current treatment: Furosemide 80 mg 1 tablet daily - Appropriate, Effective, Safe, Accessible Metoprolol tartrate 100 mg 1 tablet twice daily - Appropriate, Effective, Safe, Accessible -Medications previously tried: none  -Current home BP/HR readings: refer to above -Current dietary habits: limits use of salt shaker; does not read nutrition labels for sodium content -Current exercise habits: no structured exercise -Educated on Benefits of medications for managing symptoms and prolonging life Importance of weighing daily; if you gain more than 3 pounds in one day or 5 pounds in one week, call cardiologist. -Counseled on diet and exercise extensively Recommended to continue current medication  COPD (Goal: control symptoms and prevent exacerbations) -Not ideally controlled -Current treatment  Levalbuterol 45 mcg/act 1-2 puffs every 4-6 hours as needed - Appropriate, Effective, Safe, Accessible Bevespi aerosphere 9-4.8 mcg/act 2 puffs twice daily - Appropriate, Effective, Safe, Query accessible -Medications previously tried: unknown  -Gold Grade: Gold 3 (FEV1 30-49%) -Current COPD Classification:  B (high sx, <2 exacerbations/yr) -MMRC/CAT score: n/a -Pulmonary function testing: 2017 -Exacerbations requiring treatment in last 6 months: none -  Patient reports consistent use of maintenance inhaler -Frequency of rescue inhaler use: once daily or more; every time he uses steps -Counseled on Proper inhaler technique; Benefits of consistent maintenance inhaler use When to use rescue inhaler Differences between maintenance and rescue inhalers -Assessed patient finances. Applied for patient assistance for [employer]. Consider triple  inhaler therapy given current tobacco smoker and using rescue inhaler once a day or more.  Tobacco use (Goal quit smoking) -Uncontrolled -Previous quit attempts: none -Current treatment  No medications -Patient smokes After 30 minutes of waking -Patient triggers include:  enjoyment -On a scale of 1-10, reports MOTIVATION to quit is 9 -On a scale of 1-10, reports CONFIDENCE in quitting is 9 -Counseled on park & chew method for NRT gum. -Counseled on benefits of NRT or slowly backing down on use of cigarettes as breathing will improve with complete cessation.  Health Maintenance -Vaccine gaps:  shingrix -Current therapy:  Miralax as needed -Educated on Cost vs benefit of each product must be carefully weighed by individual consumer -Patient is satisfied with current therapy and denies issues -Recommended to continue current medication  Patient Goals/Self-Care Activities Patient will:  - take medications as prescribed focus on medication adherence by setting alarms to remember to take medications check glucose daily, document, and provide at future appointments check blood pressure daily, document, and provide at future appointments target a minimum of 150 minutes of moderate intensity exercise weekly  Follow Up Plan: The care management team will reach out to the patient again over the next 30 days.          Medication Assistance: Application for Laural Golden and Eliquis  medication assistance program. in process.  Anticipated assistance start date 10/24/21.  See plan of care for additional detail.  Compliance/Adherence/Medication fill history: Care Gaps: Eye exam, foot exam, COVID booster, shingrix BP- 120/70 A1c - 6.7% (05/19/21)  Star-Rating Drugs: Metformin (Glucophage) 1000 mg - Last filled 07/11/21 30 DS at [facility] Atorvastatin (Lipitor) 40 mg Last filled 07/11/21 30 DS at [facility]  Patient's preferred pharmacy is:  [id number] [place], [place] - [address] [place] [id number]: [phone]: [phone] [facility] [place] [address] [id number]: [phone]: [address]Phone: [phone]: [phone]  Uses pill box? Yes - fills it up one week at a time Pt endorses 90% compliance  We discussed: Benefits of medication synchronization, packaging and delivery as well as enhanced pharmacist oversight with [facility]. Patient decided to: Utilize [facility] pharmacy for medication synchronization, packaging and delivery  Care Plan and Follow Up Patient Decision:  Patient agrees to Care Plan and Follow-up.  Plan: The care management team will reach out to the patient again over the next 60 days.  MJeni Salles PharmD, BWinstonPharmacist [place]

## 2021-07-29 ENCOUNTER — Other Ambulatory Visit: Payer: Self-pay | Admitting: Cardiology

## 2021-07-29 DIAGNOSIS — I480 Paroxysmal atrial fibrillation: Secondary | ICD-10-CM

## 2021-07-30 ENCOUNTER — Telehealth: Payer: Self-pay | Admitting: Pharmacist

## 2021-07-30 NOTE — Progress Notes (Signed)
Chronic Care Management Pharmacy Assistant   Name: Jason Fisher  MRN: [id number] DOB: 06/07/1940  Reason for Encounter: Medication Review Medication Coordination   Recent office visits:  None  Recent consult visits:  None  Hospital visits:  None in previous 6 months  Medications: Outpatient Encounter Medications as of 07/30/2021  Medication Sig   ACCU-CHEK AVIVA PLUS test strip CHECK BLOOD SUGAR ONCE DAILY. DX E11.51   atorvastatin (LIPITOR) 40 MG tablet TAKE 1 TABLET BY MOUTH EVERY DAY   Continuous Blood Gluc Receiver (DEXCOM G6 RECEIVER) DEVI Check CBGs as directed   Continuous Blood Gluc Sensor (DEXCOM G6 SENSOR) MISC Apply one sensor every 10 days.   Continuous Blood Gluc Transmit (DEXCOM G6 TRANSMITTER) MISC Apply to sensor every 90 days   diltiazem (CARDIZEM CD) 120 MG 24 hr capsule TAKE 1 CAPSULE BY MOUTH EVERY DAY   ELIQUIS 5 MG TABS tablet TAKE ONE TABLET BY MOUTH AT BREAKFAST AND AT BEDTIME   furosemide (LASIX) 80 MG tablet Take 1 tablet (80 mg total) by mouth daily.   Glycopyrrolate-Formoterol (BEVESPI AEROSPHERE) 9-4.8 MCG/ACT AERO TAKE 2 PUFFS BY MOUTH TWICE A DAY   hydrALAZINE (APRESOLINE) 50 MG tablet Take 1 tablet (50 mg total) by mouth 3 (three) times daily. (Patient taking differently: Take 50 mg by mouth. Half tab daily)   levalbuterol (XOPENEX HFA) 45 MCG/ACT inhaler INHALE 1 TO 2 PUFFS BY MOUTH EVERY 6 HOURS AS NEEDED FOR WHEEZE   metFORMIN (GLUCOPHAGE) 1000 MG tablet Take 1 tablet by mouth twice daily.   metoprolol tartrate (LOPRESSOR) 100 MG tablet Take 1 tablet (100 mg total) by mouth 2 (two) times daily.   nitroGLYCERIN (NITROSTAT) 0.4 MG SL tablet Place 1 tablet (0.4 mg total) under the tongue every 5 (five) minutes as needed for chest pain. X 3 doses   olmesartan (BENICAR) 5 MG tablet Take 10 mg by mouth daily.   polyethylene glycol (MIRALAX / GLYCOLAX) packet Take 17 g by mouth daily as needed for mild constipation.   TRESIBA FLEXTOUCH 100 UNIT/ML  FlexTouch Pen INJECT 32 UNITS INTO THE SKIN AT BEDTIME.   No facility-administered encounter medications on file as of 07/30/2021.  Reviewed chart for medication changes ahead of medication coordination call.   BP Readings from Last 3 Encounters:  05/19/21 120/70  04/14/21 (!) 158/82  02/10/21 (!) 150/100    Lab Results  Component Value Date   HGBA1C 6.7 (A) 05/19/2021     Patient obtains medications through Adherence Packaging  30 Days   Last adherence delivery included:  Metformin (Glucophage)1000 mg : Take one tablet at Breakfast and at bedtime Hydralazine (Apresoline) 25 mg : Take one tablet at Breakfast  Eliquis 5 mg : Take one tablet at Breakfast and at Bedtime Furosemide  (Lasix) 80 mg : Take one tablet at Breakfast Metoprolol (Lopressor) 100 mg : Take one tablet at Breakfast and at Dinner Atorvastatin (Lipitor) 40 mg : Take one tablet at Breakfast Diltiazem (Cardizem) 120 mg : Take one tablet at Breakfast Bevespi Inhaler Olmesartan 5 mg: Take 2 tabs(10 mg) daily at breakfast Nitroglycerin 0.4 mg Tresiba FlexTouch U-100 insulin 100 unit/mL (3 mL) subcutaneous pen 06/04/2021 29     Patient declined the following medications  Nitroglycerin 0.4 mg was sent a new vial last delivery is PRN   Patient is due for next adherence delivery on: 08/11/21. Called patient and reviewed medications and coordinated delivery. Packs 30 DS  This delivery to include: Metformin (Glucophage)1000 mg : Take one  tablet at Breakfast and at bedtime Hydralazine (Apresoline) 25 mg : Take one tablet at Breakfast  Eliquis 5 mg : Take one tablet at Breakfast and at Bedtime Furosemide  (Lasix) 80 mg : Take one tablet at Breakfast Metoprolol (Lopressor) 100 mg : Take one tablet at Breakfast and at Dinner Atorvastatin (Lipitor) 40 mg : Take one tablet at Breakfast Diltiazem (Cardizem) 120 mg : Take one tablet at Breakfast Bevespi Inhaler Olmesartan 5 mg: Take 2 tabs(10 mg) daily at  breakfast   [name] was sent to patient on 07/28/2021    Confirmed delivery date of 08/11/2021, advised patient that pharmacy will contact them the morning of delivery.   Care Gaps: Zoster Vaccine - Overdue Foot Exam - Overdue COVID Booster - Overdue Eye exam - Overdue AWV-12/19 office aware to sched as of 8/22 BP- 120/70 05/19/21 Lab Results  Component Value Date   HGBA1C 6.7 (A) 05/19/2021    Star Rating Drugs: Metformin (Glucophage) 1000 mg - Last filled 07/07/21 30 DS at [facility] Atorvastatin (Lipitor) 40 mg Last filled 07/07/21 30 DS at [place] Pharmacist Assistant [phone]

## 2021-08-01 ENCOUNTER — Telehealth: Payer: Self-pay | Admitting: Emergency Medicine

## 2021-08-01 DIAGNOSIS — E1159 Type 2 diabetes mellitus with other circulatory complications: Secondary | ICD-10-CM

## 2021-08-01 DIAGNOSIS — F17210 Nicotine dependence, cigarettes, uncomplicated: Secondary | ICD-10-CM

## 2021-08-01 DIAGNOSIS — Z794 Long term (current) use of insulin: Secondary | ICD-10-CM | POA: Diagnosis not present

## 2021-08-01 DIAGNOSIS — I110 Hypertensive heart disease with heart failure: Secondary | ICD-10-CM | POA: Diagnosis not present

## 2021-08-01 DIAGNOSIS — J449 Chronic obstructive pulmonary disease, unspecified: Secondary | ICD-10-CM | POA: Diagnosis not present

## 2021-08-01 DIAGNOSIS — Z7984 Long term (current) use of oral hypoglycemic drugs: Secondary | ICD-10-CM

## 2021-08-01 DIAGNOSIS — I5030 Unspecified diastolic (congestive) heart failure: Secondary | ICD-10-CM

## 2021-08-01 DIAGNOSIS — I4891 Unspecified atrial fibrillation: Secondary | ICD-10-CM | POA: Diagnosis not present

## 2021-08-01 DIAGNOSIS — E785 Hyperlipidemia, unspecified: Secondary | ICD-10-CM | POA: Diagnosis not present

## 2021-08-01 DIAGNOSIS — I2510 Atherosclerotic heart disease of native coronary artery without angina pectoris: Secondary | ICD-10-CM | POA: Diagnosis not present

## 2021-08-01 MED ORDER — BEVESPI AEROSPHERE 9-4.8 MCG/ACT IN AERO: INHALATION_SPRAY | RESPIRATORY_TRACT | 11 refills | Status: DC

## 2021-08-01 NOTE — Telephone Encounter (Signed)
RX has been sent to [facility].

## 2021-08-01 NOTE — Progress Notes (Signed)
Per [name] call to Pulmonology Dr Agustina Caroli office to request that prescription for [facility] be sent to [facility] as patient is now approved for Patient assistance for this medication and they are needing a prescription.    [place] Clinical Pharmacist Assistant [phone]

## 2021-08-11 ENCOUNTER — Telehealth: Payer: Self-pay | Admitting: Emergency Medicine

## 2021-08-11 DIAGNOSIS — J42 Unspecified chronic bronchitis: Secondary | ICD-10-CM

## 2021-08-11 MED ORDER — LEVALBUTEROL TARTRATE 45 MCG/ACT IN AERO: INHALATION_SPRAY | RESPIRATORY_TRACT | 3 refills | Status: DC

## 2021-08-11 NOTE — Telephone Encounter (Signed)
Called and confirmed that it was the Levalbuterol that was needing to be refilled for patient. Got refills sent in. Nothing further needed

## 2021-08-20 ENCOUNTER — Encounter: Payer: Self-pay | Admitting: Internal Medicine

## 2021-08-20 ENCOUNTER — Ambulatory Visit (INDEPENDENT_AMBULATORY_CARE_PROVIDER_SITE_OTHER): Payer: Medicare Other | Admitting: Internal Medicine

## 2021-08-20 VITALS — BP 110/80 | HR 76 | Temp 97.4°F | Wt 169.2 lb

## 2021-08-20 DIAGNOSIS — I10 Essential (primary) hypertension: Secondary | ICD-10-CM

## 2021-08-20 DIAGNOSIS — E785 Hyperlipidemia, unspecified: Secondary | ICD-10-CM | POA: Diagnosis not present

## 2021-08-20 DIAGNOSIS — N1831 Chronic kidney disease, stage 3a: Secondary | ICD-10-CM

## 2021-08-20 DIAGNOSIS — J42 Unspecified chronic bronchitis: Secondary | ICD-10-CM

## 2021-08-20 DIAGNOSIS — I482 Chronic atrial fibrillation, unspecified: Secondary | ICD-10-CM | POA: Diagnosis not present

## 2021-08-20 DIAGNOSIS — E1151 Type 2 diabetes mellitus with diabetic peripheral angiopathy without gangrene: Secondary | ICD-10-CM | POA: Diagnosis not present

## 2021-08-20 LAB — POCT GLYCOSYLATED HEMOGLOBIN (HGB A1C): Hemoglobin A1C: 5.9 % — AB (ref 4.0–5.6)

## 2021-08-20 NOTE — Progress Notes (Signed)
Established Patient Office Visit     CC/Reason for Visit: 89-monthfollow-up chronic medical conditions  HPI: Jason EMELis a 81y.o. male who is coming in today for the above mentioned reasons. Past Medical History is significant for: Hypertension, hyperlipidemia, AAA, COPD, A. fib, stage III chronic kidney disease, heart failure with preserved ejection fraction, type 2 diabetes, coronary artery disease.  He follows routinely with cardiology.  He has been doing well and has no acute concerns or complaints today.  He is here with his wife.  Now that his medications are being pill packed he is being much more adherent.   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 2007 Inf STEMI s/p Vision BMS  2 to RCA;  b. 2009 ISR Prox RCA Stent-->with DES;  c. 09/2015 PCI: LM nl, LAD 30p/m, D1 90, LCX nl, OM1 small, 80, OM2 90 (2.5x14 Biofreedom stent), OM3 small, RCA 30p ISR, 176mSR, 408m5d, EF 50-55%.   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 On Eliquis (CHA2DS2VASc = 5).  Rate controlled w/ BB and CCB.   Chronic diastolic CHF (congestive heart failure) ([place]  a. 09/2015 Echo: EF 55-60%, mildly dil LA.   CKD (chronic kidney disease), stage III ([place]  they mentioned this to the patient but they would work on it later after the aneurysm   COLONIC POLYPS, HX OF 11/02/2006   COPD (chronic obstructive pulmonary disease) ([place]  DIABETES MELLITUS, TYPE II 10/29/2006   Diverticulosis    History of tobacco abuse    a. Quit 09/2015.   HYPERLIPIDEMIA 10/29/2006   Hypertension    Hypertensive heart disease with heart failure ([place]CC[place]  Pneumonia 09/2015   /noArchie Endo29/2018   [facility] elevation [facility] infarction) [facility]   /noArchie Endo29/2018    Past Surgical History:  Procedure Laterality Date   ABDOMINAL  AORTIC ENDOVASCULAR STENT GRAFT N/A 07/31/2016   Procedure: ABDOMINAL AORTIC ENDOVASCULAR STENT GRAFT;  Surgeon: BraSerafina MitchellD;  [facility];  Laterality: N/A;   CARDIAC CATHETERIZATION N/A 09/26/2015   Procedure: Left Heart Cath and Coronary Angiography;  Surgeon: Peter M JorMartiniqueD;  [facility] [place] [facility];  Service: Cardiovascular;  Laterality: N/A;   CARDIAC CATHETERIZATION N/A 09/26/2015   Procedure: Coronary Stent Intervention;  Surgeon: Peter M JorMartiniqueD;  [facility];  Service: Cardiovascular;  Laterality: N/A;   CATARACT EXTRACTION Bilateral    COLONOSCOPY     CORONARY ANGIOPLASTY WITH STENT PLACEMENT  2007   Inferior STEMI 2007 - RCA PCI with 2 Vision BMS; Abnormal Myoview in 2009 - pRCA ins-stent restenosis & unstented disease - DES PCI Promus 3.0 x 20 mm (3.5 mm)   ENDOVASCULAR REPAIR/STENT GRAFT  07/31/2016   INCISION AND DRAINAGE PERIRECTAL ABSCESS N/A 11/06/2015   Procedure: IRRIGATION AND DEBRIDEMENT PERIRECTAL ABSCESS, POSSIBLE HEMORRHOIDECTOMY;  Surgeon: DouCoralie KeensD;  [facility];  Laterality: N/A;   ORIF SHOULDER FRACTURE Right    "fell out of back end of a truck"    Social History:  reports that he has been smoking cigarettes. He has a 49.00 pack-year smoking history. He has never used smokeless tobacco. He reports that he does not drink  alcohol and does not use drugs.  Allergies: Allergies  Allergen Reactions   Contrast Media [Iodinated Contrast Media] Anaphylaxis    Family History:  Family History  Problem Relation Age of Onset   Heart attack Mother    Diabetes Mother    Heart attack Father    Hypertension Father    Heart attack Brother    Diabetes Brother      Current Outpatient Medications:    ACCU-CHEK AVIVA PLUS test strip, CHECK BLOOD SUGAR ONCE DAILY. DX E11.51, Disp: 100 strip, Rfl: 2   atorvastatin (LIPITOR) 40 MG tablet, TAKE 1 TABLET BY MOUTH EVERY DAY, Disp: 90  tablet, Rfl: 1   Continuous Blood Gluc Receiver (DEXCOM G6 RECEIVER) DEVI, Check CBGs as directed, Disp: 1 each, Rfl: 0   Continuous Blood Gluc Sensor (DEXCOM G6 SENSOR) MISC, Apply one sensor every 10 days., Disp: 3 each, Rfl: 2   Continuous Blood Gluc Transmit (DEXCOM G6 TRANSMITTER) MISC, Apply to sensor every 90 days, Disp: 1 each, Rfl: 0   diltiazem (CARDIZEM CD) 120 MG 24 hr capsule, TAKE 1 CAPSULE BY MOUTH EVERY DAY, Disp: 90 capsule, Rfl: 1   ELIQUIS 5 MG TABS tablet, TAKE ONE TABLET BY MOUTH AT BREAKFAST AND AT BEDTIME, Disp: 180 tablet, Rfl: 1   furosemide (LASIX) 80 MG tablet, Take 1 tablet (80 mg total) by mouth daily., Disp: 90 tablet, Rfl: 1   Glycopyrrolate-Formoterol (BEVESPI AEROSPHERE) 9-4.8 MCG/ACT AERO, TAKE 2 PUFFS BY MOUTH TWICE A DAY, Disp: 10.7 g, Rfl: 11   hydrALAZINE (APRESOLINE) 50 MG tablet, Take 1 tablet (50 mg total) by mouth 3 (three) times daily. (Patient taking differently: Take 50 mg by mouth. Half tab daily), Disp: 270 tablet, Rfl: 1   levalbuterol (XOPENEX HFA) 45 MCG/ACT inhaler, INHALE 1 TO 2 PUFFS BY MOUTH EVERY 6 HOURS AS NEEDED FOR WHEEZE, Disp: 15 each, Rfl: 3   metFORMIN (GLUCOPHAGE) 1000 MG tablet, Take 1 tablet by mouth twice daily., Disp: 180 tablet, Rfl: 1   metoprolol tartrate (LOPRESSOR) 100 MG tablet, Take 1 tablet (100 mg total) by mouth 2 (two) times daily., Disp: 180 tablet, Rfl: 3   nitroGLYCERIN (NITROSTAT) 0.4 MG SL tablet, Place 1 tablet (0.4 mg total) under the tongue every 5 (five) minutes as needed for chest pain. X 3 doses, Disp: 25 tablet, Rfl: 4   olmesartan (BENICAR) 5 MG tablet, Take 10 mg by mouth daily., Disp: , Rfl:    polyethylene glycol (MIRALAX / GLYCOLAX) packet, Take 17 g by mouth daily as needed for mild constipation., Disp: , Rfl:    TRESIBA FLEXTOUCH 100 UNIT/ML FlexTouch Pen, INJECT 32 UNITS INTO THE SKIN AT BEDTIME., Disp: 15 mL, Rfl: 2  Review of Systems:  Constitutional: Denies fever, chills, diaphoresis, appetite  change and fatigue.  HEENT: Denies photophobia, eye pain, redness, hearing loss, ear pain, congestion, sore throat, rhinorrhea, sneezing, mouth sores, trouble swallowing, neck pain, neck stiffness and tinnitus.   Respiratory: Denies SOB, DOE, cough, chest tightness,  and wheezing.   Cardiovascular: Denies chest pain, palpitations and leg swelling.  Gastrointestinal: Denies nausea, vomiting, abdominal pain, diarrhea, constipation, blood in stool and abdominal distention.  Genitourinary: Denies dysuria, urgency, frequency, hematuria, flank pain and difficulty urinating.  Endocrine: Denies: hot or cold intolerance, sweats, changes in hair or nails, polyuria, polydipsia. Musculoskeletal: Denies myalgias, back pain, joint swelling, arthralgias and gait problem.  Skin: Denies pallor, rash and wound.  Neurological: Denies dizziness, seizures, syncope, weakness, light-headedness, numbness and headaches.  Hematological: Denies adenopathy.  Easy bruising, personal or family bleeding history  Psychiatric/Behavioral: Denies suicidal ideation, mood changes, confusion, nervousness, sleep disturbance and agitation    Physical Exam: Vitals:   08/20/21 1315  BP: 110/80  Pulse: 76  Temp: (!) 97.4 F (36.3 C)  TempSrc: Oral  SpO2: 97%  Weight: 169 lb 3.2 oz (76.7 kg)    Body mass index is 24.99 kg/m.   Constitutional: NAD, calm, comfortable Eyes: PERRL, lids and conjunctivae normal, wears corrective lenses ENMT: Mucous membranes are moist.  Respiratory: clear to auscultation bilaterally, no wheezing, no crackles. Normal respiratory effort. No accessory muscle use.  Cardiovascular: Regular rate and rhythm, no murmurs / rubs / gallops. No extremity edema.  Psychiatric: Normal judgment and insight. Alert and oriented x 3. Normal mood.    Impression and Plan:  DM (diabetes mellitus), type 2 with peripheral vascular complications ([place]) - Plan: POCT glycosylated hemoglobin (Hb A1C)  Essential  hypertension  Dyslipidemia  Chronic bronchitis, unspecified chronic bronchitis type (HCC)  Stage 3a chronic kidney disease (HCC)  Chronic atrial fibrillation ([place]): CHA2DS2Vasc = 5. On Warfarin. Rate control  -A1c today demonstrates good diabetic control at 6.5, blood pressure is within normal limits. -At last check his LDL was at goal at 2 as of January 2023.  His kidney function has been at his baseline of around 1.6-1.7, he follows with nephrology routinely.  Time spent:32 minutes reviewing chart, interviewing and examining patient and formulating plan of care.     Lelon Frohlich, MD [facility] at [facility]

## 2021-08-29 ENCOUNTER — Telehealth: Payer: Self-pay | Admitting: Pharmacist

## 2021-08-29 NOTE — Chronic Care Management (AMB) (Unsigned)
Chronic Care Management Pharmacy Assistant   Name: Jason Fisher  MRN: [id number] DOB: [birth year]  Reason for Encounter: Medication Review Medication Coordination   Recent office visits:  08/20/21  [name], [name], MD - Patient presented for Diabetes Mellitus type 2 with peripheral vascular complications and other concerns. No medication changes.  Recent consult visits:  None  Hospital visits:  None in previous 6 months  Medications: Outpatient Encounter Medications as of 08/29/2021  Medication Sig   ACCU-CHEK AVIVA PLUS test strip CHECK BLOOD SUGAR ONCE DAILY. DX E11.51   atorvastatin (LIPITOR) 40 MG tablet TAKE 1 TABLET BY MOUTH EVERY DAY   Continuous Blood Gluc Receiver (DEXCOM G6 RECEIVER) DEVI Check CBGs as directed   Continuous Blood Gluc Sensor (DEXCOM G6 SENSOR) MISC Apply one sensor every 10 days.   Continuous Blood Gluc Transmit (DEXCOM G6 TRANSMITTER) MISC Apply to sensor every 90 days   diltiazem (CARDIZEM CD) 120 MG 24 hr capsule TAKE 1 CAPSULE BY MOUTH EVERY DAY   ELIQUIS 5 MG TABS tablet TAKE ONE TABLET BY MOUTH AT BREAKFAST AND AT BEDTIME   furosemide (LASIX) 80 MG tablet Take 1 tablet (80 mg total) by mouth daily.   Glycopyrrolate-Formoterol (BEVESPI AEROSPHERE) 9-4.8 MCG/ACT AERO TAKE 2 PUFFS BY MOUTH TWICE A DAY   hydrALAZINE (APRESOLINE) 50 MG tablet Take 1 tablet (50 mg total) by mouth 3 (three) times daily. (Patient taking differently: Take 50 mg by mouth. Half tab daily)   levalbuterol (XOPENEX HFA) 45 MCG/ACT inhaler INHALE 1 TO 2 PUFFS BY MOUTH EVERY 6 HOURS AS NEEDED FOR WHEEZE   metFORMIN (GLUCOPHAGE) 1000 MG tablet Take 1 tablet by mouth twice daily.   metoprolol tartrate (LOPRESSOR) 100 MG tablet Take 1 tablet (100 mg total) by mouth 2 (two) times daily.   nitroGLYCERIN (NITROSTAT) 0.4 MG SL tablet Place 1 tablet (0.4 mg total) under the tongue every 5 (five) minutes as needed for chest pain. X 3 doses   olmesartan (BENICAR) 5 MG tablet Take  10 mg by mouth daily.   polyethylene glycol (MIRALAX / GLYCOLAX) packet Take 17 g by mouth daily as needed for mild constipation.   TRESIBA FLEXTOUCH 100 UNIT/ML FlexTouch Pen INJECT 32 UNITS INTO THE SKIN AT BEDTIME.   No facility-administered encounter medications on file as of 08/29/2021.   Reviewed chart for medication changes ahead of medication coordination call.  BP Readings from Last 3 Encounters:  08/20/21 110/80  05/19/21 120/70  04/14/21 (!) 158/82    Lab Results  Component Value Date   HGBA1C 5.9 (A) 08/20/2021     Patient obtains medications through Adherence Packaging  30 Days   Last adherence delivery included:  Metformin (Glucophage)1000 mg : Take one tablet at Breakfast and at bedtime Hydralazine (Apresoline) 25 mg : Take one tablet at Breakfast  Eliquis 5 mg : Take one tablet at Breakfast and at Bedtime Furosemide  (Lasix) 80 mg : Take one tablet at Breakfast Metoprolol (Lopressor) 100 mg : Take one tablet at Breakfast and at Dinner Atorvastatin (Lipitor) 40 mg : Take one tablet at Breakfast Diltiazem (Cardizem) 120 mg : Take one tablet at Breakfast Bevespi Inhaler Olmesartan 5 mg: Take 2 tabs(10 mg) daily at breakfast  [name] was sent to patient on 07/28/2021    Patient is due for next adherence delivery on: 09/10/21. Called patient and reviewed medications and coordinated delivery. Packs 30 DS  This delivery to include: Metformin (Glucophage)1000 mg : Take one tablet at Breakfast and at  bedtime Hydralazine (Apresoline) 25 mg : Take one tablet at Breakfast  Eliquis 5 mg : Take one tablet at Breakfast and at Bedtime Furosemide  (Lasix) 80 mg : Take one tablet at Breakfast Metoprolol (Lopressor) 100 mg : Take one tablet at Breakfast and at Dinner Atorvastatin (Lipitor) 40 mg : Take one tablet at Breakfast Diltiazem (Cardizem) 120 mg : Take one tablet at Breakfast Olmesartan 5 mg: Take 2 tabs(10 mg) daily at breakfast  [name] on auto fill and due to be sent  on 09/23/21 pt aware Levalbuterol? Received Bevespi pt assistance deliv? Need from [facility]? Nitrogylcerin    Patient will need a short fill of (med), prior to adherence delivery. (To align with sync date or if PRN med)  Coordinated acute fill for (med) to be delivered (date).  Patient declined the following medications (meds) due to (reason)  Patient needs refills for ***.  Confirmed delivery date of 09/10/21, advised patient that pharmacy will contact them the morning of delivery.   Care Gaps: Foot Exam - Overdue Eye Exam - Postponed COVID Booster - Postponed Zoster Vaccine - Postponed BP- 110/80 08/20/21 AWV- 12/19 office aware to sched as of 8/22  Lab Results  Component Value Date   HGBA1C 5.9 (A) 08/20/2021    Star Rating Drugs: Metformin (Glucophage) 1000 mg - Last filled 08/04/21 30 DS at [facility] Atorvastatin (Lipitor) 40 mg Last filled 08/04/21 30 DS at [place] Pharmacist Assistant [phone]

## 2021-09-28 ENCOUNTER — Other Ambulatory Visit: Payer: Self-pay | Admitting: Internal Medicine

## 2021-09-28 DIAGNOSIS — I5033 Acute on chronic diastolic (congestive) heart failure: Secondary | ICD-10-CM

## 2021-09-28 DIAGNOSIS — E1151 Type 2 diabetes mellitus with diabetic peripheral angiopathy without gangrene: Secondary | ICD-10-CM

## 2021-09-29 ENCOUNTER — Telehealth: Payer: Self-pay | Admitting: Pharmacist

## 2021-09-29 NOTE — Chronic Care Management (AMB) (Signed)
Chronic Care Management Pharmacy Assistant   Name: Jason Fisher  MRN: [id number] DOB: [birth year]  Reason for Encounter: Medication Review Medication Coordination  Recent office visits:  None  Recent consult visits:  None   Hospital visits:  None in previous 6 months  Medications: Outpatient Encounter Medications as of 09/29/2021  Medication Sig   ACCU-CHEK AVIVA PLUS test strip CHECK BLOOD SUGAR ONCE DAILY. DX E11.51   atorvastatin (LIPITOR) 40 MG tablet TAKE 1 TABLET BY MOUTH EVERY DAY   Continuous Blood Gluc Receiver (DEXCOM G6 RECEIVER) DEVI Check CBGs as directed   Continuous Blood Gluc Sensor (DEXCOM G6 SENSOR) MISC Apply one sensor every 10 days.   Continuous Blood Gluc Transmit (DEXCOM G6 TRANSMITTER) MISC Apply to sensor every 90 days   diltiazem (CARDIZEM CD) 120 MG 24 hr capsule TAKE 1 CAPSULE BY MOUTH EVERY DAY   ELIQUIS 5 MG TABS tablet TAKE ONE TABLET BY MOUTH AT BREAKFAST AND AT BEDTIME   furosemide (LASIX) 80 MG tablet Take 1 tablet (80 mg total) by mouth daily.   Glycopyrrolate-Formoterol (BEVESPI AEROSPHERE) 9-4.8 MCG/ACT AERO TAKE 2 PUFFS BY MOUTH TWICE A DAY   hydrALAZINE (APRESOLINE) 50 MG tablet Take 1 tablet (50 mg total) by mouth 3 (three) times daily. (Patient taking differently: Take 50 mg by mouth. Half tab daily)   levalbuterol (XOPENEX HFA) 45 MCG/ACT inhaler INHALE 1 TO 2 PUFFS BY MOUTH EVERY 6 HOURS AS NEEDED FOR WHEEZE   metFORMIN (GLUCOPHAGE) 1000 MG tablet Take 1 tablet by mouth twice daily.   metoprolol tartrate (LOPRESSOR) 100 MG tablet Take 1 tablet (100 mg total) by mouth 2 (two) times daily.   nitroGLYCERIN (NITROSTAT) 0.4 MG SL tablet Place 1 tablet (0.4 mg total) under the tongue every 5 (five) minutes as needed for chest pain. X 3 doses   olmesartan (BENICAR) 5 MG tablet Take 10 mg by mouth daily.   polyethylene glycol (MIRALAX / GLYCOLAX) packet Take 17 g by mouth daily as needed for mild constipation.   TRESIBA FLEXTOUCH 100 UNIT/ML  FlexTouch Pen INJECT 32 UNITS INTO THE SKIN AT BEDTIME.   No facility-administered encounter medications on file as of 09/29/2021.   Reviewed chart for medication changes ahead of medication coordination call.  BP Readings from Last 3 Encounters:  08/20/21 110/80  05/19/21 120/70  04/14/21 (!) 158/82    Lab Results  Component Value Date   HGBA1C 5.9 (A) 08/20/2021     Patient obtains medications through Adherence Packaging  30 Days   Last adherence delivery included:  Metformin (Glucophage)1000 mg : Take one tablet at Breakfast and at bedtime Hydralazine (Apresoline) 25 mg : Take one tablet at Breakfast  Eliquis 5 mg : Take one tablet at Breakfast and at Bedtime Furosemide  (Lasix) 80 mg : Take one tablet at Breakfast Metoprolol (Lopressor) 100 mg : Take one tablet at Breakfast and at Dinner Atorvastatin (Lipitor) 40 mg : Take one tablet at Breakfast Diltiazem (Cardizem) 120 mg : Take one tablet at Breakfast Olmesartan 5 mg: Take 2 tabs(10 mg) daily at breakfast   [name] on auto fill and due to be sent on 09/23/21     Unable to reach to confirm delivery date of 09/10/21, left msg for  patient that pharmacy will contact them the morning of delivery.     Patient is due for next adherence delivery on: 10/09/21. Called patient and reviewed medications and coordinated delivery. Packs 30 DS   This delivery to include: Metformin (Glucophage)1000 mg :  Take one tablet at Breakfast and at bedtime Hydralazine (Apresoline) 25 mg : Take one tablet at Breakfast  Eliquis 5 mg : Take one tablet at Breakfast and at Bedtime Furosemide  (Lasix) 80 mg : Take one tablet at Breakfast Metoprolol (Lopressor) 100 mg : Take one tablet at Breakfast and at Dinner Atorvastatin (Lipitor) 40 mg : Take one tablet at Breakfast Diltiazem (Cardizem) 120 mg : Take one tablet at Breakfast Olmesartan 5 mg: Take 2 tabs(10 mg) daily at breakfast  [name] on auto fill and due sent on 09/23/21   Unable to reach  to confirm delivery date of 10/09/21, left voicemail that pharmacy will contact them the morning of delivery.   Care Gaps: Foot Exam - Overdue Flu Vaccine - Overdue  Eye Exam - Postponed COVID Booster - Postponed Zoster Vaccine - Postponed BP- 110/80 08/20/21 AWV- 12/19 office aware to sched as of 8/22  Star Rating Drugs: Metformin (Glucophage) 1000 mg - Last filled 09/08/21 30 DS at [facility] Atorvastatin (Lipitor) 40 mg Last filled 09/08/21 30 DS at [place] Pharmacist Assistant [phone]

## 2021-10-09 NOTE — Progress Notes (Unsigned)
Cardiology Office Note    Date:  10/09/2021   ID:  Jason Fisher, DOB 27-Oct-1940, MRN [id number]  PCP:  [name], [name], MD  Cardiologist:  Dr. Martinique   No chief complaint on file.   History of Present Illness:  Jason Fisher is a 81 y.o. male seen for evaluation of SOB and fatigue. He has a PMH of CAD (s/p BMS to RCA 2007, ISR with DES to RCA in 2009, DES to OM2 in 09/2015), chronic atrial fibrillation on Eliquis, chronic diastolic heart failure, HTN, HLD, AAA, COPD, and tobacco use.  He was previously admitted in March 2018 for atrial fibrillation with RVR.  Troponin was borderline elevated.  He was initially treated with IV Cardizem and later switched to p.o. Cardizem prior to discharge.  He also underwent AAA endovascular stent grafting by Dr. Trula Slade in June 2018.  He was seen in October 2019 by [name] PA-C for increased edema.  Lasix was increased to 80 mg twice daily for 3 days before decreasing back down to 40 mg twice daily thereafter.  Echocardiogram on 11/05/2017 this showed EF 60 to 65%, mildly increased aortic root measuring 40 mm, moderately dilated left and the right atrium.   When seen in September 2021 he was not feeling well. He has a lot of congestion in his chest with clear phlegm. Had been using Dayquil. He was more SOB. No increase in edema or weight. He  felt very tired and is dizzy. Had one episode of chest pain relieved with sl Ntg. He was found to be markedly bradycardic with HR 42. Diltiazem was stopped and metoprolol dose reduced. On follow up one week later HR was 119 in Afib. Metoprolol was increased back to 100 mg bid. CXR showed mild pleural effusions suggestive of mild CHF. Emphysema.   He was admitted in April 2022 with worsening dyspnea on exertion.  While in the emergency room, blood pressure was 207/109.  He was admitted for acute on chronic diastolic heart failure and A. fib with RVR.  Echocardiogram obtained during that admission showed EF 50 to  55%, no regional wall motion abnormality, moderate LVH.  He was eventually released on the prior home dose of Lasix 80 mg daily.  Cardizem was added back every 6 hours and he was eventually transitioned to 120 mg daily of Cardizem CD.  AAA ultrasound obtained in June 2022 showed a stable sac measuring 3.98 in transverse dimension. When seen in September Afib rate was controlled. TFTs were normal. He was started on hydralazine for BP.   On follow up today he is doing OK. He is mainly limited by SOB with his COPD. Now has a scooter. Has 14 steps to his house and gives out of breath climbing the stairs. Building a ramp. Feet were swollen a couple of weeks ago. Took extra lasix with resolution. BP is labile. No chest pain or palpitations.    Past Medical History:  Diagnosis Date   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 2007 Inf STEMI s/p Vision BMS  2 to RCA;  b. 2009 ISR Prox RCA Stent-->with DES;  c. 09/2015 PCI: LM nl, LAD 30p/m, D1 90, LCX nl, OM1 small, 80, OM2 90 (2.5x14 Biofreedom stent), OM3 small, RCA 30p ISR, 29mISR, 433m85d, EF 50-55%.   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  On Eliquis (CHA2DS2VASc = 5).  Rate controlled w/ BB and CCB.   Chronic diastolic CHF (congestive heart failure) ([place])    a. 09/2015 Echo: EF 55-60%, mildly dil LA.   CKD (chronic kidney disease), stage III ([place])    they mentioned this to the patient but they would work on it later after the aneurysm   COLONIC POLYPS, HX OF 11/02/2006   COPD (chronic obstructive pulmonary disease) ([place])    DIABETES MELLITUS, TYPE II 10/29/2006   Diverticulosis    History of tobacco abuse    a. Quit 09/2015.   HYPERLIPIDEMIA 10/29/2006   Hypertension    Hypertensive heart disease with heart failure ([place])    Pneumonia 09/2015   [name] 07/31/2016   STEMI (ST elevation myocardial infarction) [facility]) 2007   [name]  07/31/2016    Past Surgical History:  Procedure Laterality Date   ABDOMINAL AORTIC ENDOVASCULAR STENT GRAFT N/A 07/31/2016   Procedure: ABDOMINAL AORTIC ENDOVASCULAR STENT GRAFT;  Surgeon: Serafina Mitchell, MD;  Location: [place];  Service: Vascular;  Laterality: N/A;   CARDIAC CATHETERIZATION N/A 09/26/2015   Procedure: Left Heart Cath and Coronary Angiography;  Surgeon: Denesia Donelan M Martinique, MD;  Location: [place] CV [facility];  Service: Cardiovascular;  Laterality: N/A;   CARDIAC CATHETERIZATION N/A 09/26/2015   Procedure: Coronary Stent Intervention;  Surgeon: Nuala Chiles M Martinique, MD;  Location: [place] CV LAB;  Service: Cardiovascular;  Laterality: N/A;   CATARACT EXTRACTION Bilateral    COLONOSCOPY     CORONARY ANGIOPLASTY WITH STENT PLACEMENT  2007   Inferior STEMI 2007 - RCA PCI with 2 Vision BMS; Abnormal Myoview in 2009 - pRCA ins-stent restenosis & unstented disease - DES PCI Promus 3.0 x 20 mm (3.5 mm)   ENDOVASCULAR REPAIR/STENT GRAFT  07/31/2016   INCISION AND DRAINAGE PERIRECTAL ABSCESS N/A 11/06/2015   Procedure: IRRIGATION AND DEBRIDEMENT PERIRECTAL ABSCESS, POSSIBLE HEMORRHOIDECTOMY;  Surgeon: Coralie Keens, MD;  Location: [place];  Service: General;  Laterality: N/A;   ORIF SHOULDER FRACTURE Right    "fell out of back end of a truck"    Current Medications: Outpatient Medications Prior to Visit  Medication Sig Dispense Refill   ACCU-CHEK AVIVA PLUS test strip CHECK BLOOD SUGAR ONCE DAILY. DX E11.51 100 strip 2   atorvastatin (LIPITOR) 40 MG tablet TAKE 1 TABLET BY MOUTH EVERY DAY 90 tablet 1   Continuous Blood Gluc Receiver (DEXCOM G6 RECEIVER) DEVI Check CBGs as directed 1 each 0   Continuous Blood Gluc Sensor (DEXCOM G6 SENSOR) MISC Apply one sensor every 10 days. 3 each 2   Continuous Blood Gluc Transmit (DEXCOM G6 TRANSMITTER) MISC Apply to sensor every 90 days 1 each 0   diltiazem (CARDIZEM CD) 120 MG 24 hr capsule TAKE 1 CAPSULE BY MOUTH EVERY DAY 90 capsule 1   ELIQUIS 5 MG  TABS tablet TAKE ONE TABLET BY MOUTH AT BREAKFAST AND AT BEDTIME 180 tablet 1   furosemide (LASIX) 80 MG tablet TAKE ONE TABLET BY MOUTH ONCE DAILY 90 tablet 1   Glycopyrrolate-Formoterol (BEVESPI AEROSPHERE) 9-4.8 MCG/ACT AERO TAKE 2 PUFFS BY MOUTH TWICE A DAY 10.7 g 11   hydrALAZINE (APRESOLINE) 50 MG tablet Take 1 tablet (50 mg total) by mouth 3 (three) times daily. (Patient taking differently: Take 50 mg by mouth. Half tab daily) 270 tablet 1   levalbuterol (XOPENEX HFA) 45 MCG/ACT inhaler INHALE 1 TO 2 PUFFS BY MOUTH EVERY 6 HOURS AS NEEDED FOR WHEEZE 15 each 3   metFORMIN (GLUCOPHAGE) 1000  MG tablet TAKE ONE TABLET BY MOUTH AT BREAKFAST AND AT BEDTIME 180 tablet 1   metoprolol tartrate (LOPRESSOR) 100 MG tablet Take 1 tablet (100 mg total) by mouth 2 (two) times daily. 180 tablet 3   nitroGLYCERIN (NITROSTAT) 0.4 MG SL tablet Place 1 tablet (0.4 mg total) under the tongue every 5 (five) minutes as needed for chest pain. X 3 doses 25 tablet 4   olmesartan (BENICAR) 5 MG tablet Take 10 mg by mouth daily.     polyethylene glycol (MIRALAX / GLYCOLAX) packet Take 17 g by mouth daily as needed for mild constipation.     TRESIBA FLEXTOUCH 100 UNIT/ML FlexTouch Pen INJECT 32 UNITS INTO THE SKIN AT BEDTIME. 15 mL 2   No facility-administered medications prior to visit.     Allergies:   Contrast media [iodinated contrast media]   Social History   Socioeconomic History   Marital status: Married    Spouse name: Not on file   Number of children: 5   Years of education: Not on file   Highest education level: Not on file  Occupational History   Occupation: [employer]  Tobacco Use   Smoking status: Some Days    Packs/day: 1.00    Years: 49.00    Total pack years: 49.00    Types: Cigarettes    Last attempt to quit: 09/08/2015    Years since quitting: 6.0   Smokeless tobacco: Never   Tobacco comments:    passive smoker as well  Vaping Use   Vaping Use: Never used  Substance and Sexual  Activity   Alcohol use: No    Alcohol/week: 0.0 standard drinks of alcohol   Drug use: No   Sexual activity: Not on file  Other Topics Concern   Not on file  Social History Narrative   Not on file   Social Determinants of Health   Financial Resource Strain: Medium Risk (09/23/2020)   Overall Financial Resource Strain (CARDIA)    Difficulty of Paying Living Expenses: Somewhat hard  Food Insecurity: Not on file  Transportation Needs: No Transportation Needs (09/23/2020)   PRAPARE - Transportation    Lack of Transportation (Medical): No    Lack of Transportation (Non-Medical): No  Physical Activity: Sufficiently Active (06/08/2019)   Exercise Vital Sign    Days of Exercise per Week: 3 days    Minutes of Exercise per Session: 60 min  Stress: Not on file  Social Connections: Not on file     Family History:  The patient's family history includes Diabetes in his brother and mother; Heart attack in his brother, father, and mother; Hypertension in his father.   ROS:   Please see the history of present illness.    ROS All other systems reviewed and are negative.   PHYSICAL EXAM:   VS:  There were no vitals taken for this visit.   GENERAL:  Chronically ill appearing WM in NAD HEENT:  PERRL, EOMI, sclera are clear. Oropharynx is clear. NECK:  No jugular venous distention, carotid upstroke brisk and symmetric, no bruits, no thyromegaly or adenopathy LUNGS:  Coarse BS with wheezes bilaterally.  CHEST:  Unremarkable HEART:  IRRR,  PMI not displaced or sustained,S1 and S2 within normal limits, no S3, no S4: no clicks, no rubs, no murmurs ABD:  Soft, nontender. BS +, no masses or bruits. No hepatomegaly, no splenomegaly EXT:  2 + pulses throughout, no edema, no cyanosis no clubbing SKIN:  Warm and dry.  No rashes NEURO:  Alert and oriented x 3. Cranial nerves II through XII intact. PSYCH:  Cognitively intact    Wt Readings from Last 3 Encounters:  08/20/21 169 lb 3.2 oz (76.7 kg)   05/19/21 171 lb 14.4 oz (78 kg)  04/14/21 171 lb 12.8 oz (77.9 kg)      Studies/Labs Reviewed:   EKG:  EKG is not  ordered today.     Recent Labs: 04/28/2021: ALT 16; BUN 26; Creatinine 1.6; Hemoglobin 13.6; Potassium 3.7; Sodium 141   Lipid Panel    Component Value Date/Time   CHOL 122 02/10/2021 1404   CHOL 141 08/02/2020 1033   TRIG 94.0 02/10/2021 1404   HDL 36.70 (L) 02/10/2021 1404   HDL 39 (L) 08/02/2020 1033   CHOLHDL 3 02/10/2021 1404   VLDL 18.8 02/10/2021 1404   LDLCALC 66 02/10/2021 1404   LDLCALC 82 08/02/2020 1033    Additional studies/ records that were reviewed today include:   Cath 09/26/2015 Prox LAD to Mid LAD lesion, 30 %stenosed. 1st Diag lesion, 90 %stenosed. Ost 1st Mrg to 1st Mrg lesion, 80 %stenosed. Ost RCA to Prox RCA lesion, 30 %stenosed. Mid RCA-2 lesion, 40 %stenosed. Mid RCA-1 lesion, 15 %stenosed. 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 There is mild left ventricular systolic dysfunction. The left ventricular ejection fraction is 50-55% by visual estimate. LV end diastolic pressure is moderately elevated. A drug eluting stent was successfully placed. 2nd Mrg lesion, 90 %stenosed. Post intervention, there is a 0% residual stenosis.   1. 3 vessel obstructive CAD.     - chronic severe stenosis in the first diagonal    - New 90% stenosis in a large second OM    - Stents in the proximal and mid RCA are patent    - Aneurysm and stenosis in the PLOM branch of the RCA- the aneurysm is new compared to 2009 2. Mild LV dysfunction 3. Elevated LVEDP 4. Allergic response to contrast with hives and bronchospasm. 5. Successful stenting of the second OM with a Biofreedom stent.    Plan: continue IV diuresis. Patient received steroids, Benadryl, and IV pepcid for contrast reaction. Continue DAPT for one month then DC ASA. Resume Eliquis in am.        Echo 11/05/2017 LV EF: 60% -   65% Study Conclusions   - Left  ventricle: The cavity size was normal. There was moderate   concentric hypertrophy. Systolic function was normal. The   estimated ejection fraction was in the range of 60% to 65%. Wall   motion was normal; there were no regional wall motion   abnormalities. The study was not technically sufficient to allow   evaluation of LV diastolic dysfunction due to atrial   fibrillation. - Aortic valve: Poorly visualized. Trileaflet; moderately   thickened, moderately calcified leaflets. - Aorta: Aortic root dimension: 40 mm (ED). Ascending aortic   diameter: 38 mm (S). - Aortic root: The aortic root was mildly dilated. - Ascending aorta: The ascending aorta was mildly dilated. - Left atrium: The atrium was moderately dilated. - Right atrium: The atrium was moderately dilated. - Pulmonic valve: There was mild regurgitation. - Pulmonary arteries: Systolic pressure could not be accurately   estimated.     Echo 05/11/2020  1. Left ventricular ejection fraction, by estimation, is 50 to 55%. The  left ventricle has low normal function. The left ventricle has no regional  wall motion abnormalities. There is moderate left ventricular hypertrophy.  Left  ventricular diastolic  function could not be evaluated.   2. Right ventricular systolic function is normal. The right ventricular  size is normal. There is normal pulmonary artery systolic pressure.   3. Left atrial size was mildly dilated.   4. The mitral valve is grossly normal. Trivial mitral valve  regurgitation.   5. The aortic valve is grossly normal. Aortic valve regurgitation is  trivial.   6. The inferior vena cava is dilated in size with >50% respiratory  variability, suggesting right atrial pressure of 8 mmHg.     ASSESSMENT:    No diagnosis found.     PLAN:  In order of problems listed above:  Chronic atrial fibrillation. Rate is well controlled.  Continue Eliquis. Continue current metoprolol dose.   CAD: s/p multiple  interventions in the past- last in 2017. On Plavix and lopressor. On statin. No significant angina.  Chronic diastolic CHF: volume status is good on current lasix dose. No edema and weight is stable. Continue to use extra lasix PRN.   Hyperlipidemia: On Lipitor 40 mg daily.  LDL 66  History of AAA repair: followed by [facility].   6..  DM type 2 on metformin.  A1c- 6.7.   Follow up in 6 months   Medication Adjustments/Labs and Tests Ordered: Current medicines are reviewed at length with the patient today.  Concerns regarding medicines are outlined above.  Medication changes, Labs and Tests ordered today are listed in the Patient Instructions below. There are no Patient Instructions on file for this visit.   Signed, Ponce Skillman Martinique, MD  10/09/2021 12:56 PM    [facility] [address], [place], [place]  [address] Phone: [phone]; Fax: [phone]

## 2021-10-13 ENCOUNTER — Ambulatory Visit: Payer: Medicare Other | Attending: Cardiology | Admitting: Cardiology

## 2021-10-13 ENCOUNTER — Other Ambulatory Visit: Payer: Self-pay | Admitting: Internal Medicine

## 2021-10-13 ENCOUNTER — Encounter: Payer: Self-pay | Admitting: Cardiology

## 2021-10-13 VITALS — BP 130/70 | HR 66 | Ht 69.0 in | Wt 165.4 lb

## 2021-10-13 DIAGNOSIS — Z8679 Personal history of other diseases of the circulatory system: Secondary | ICD-10-CM

## 2021-10-13 DIAGNOSIS — J439 Emphysema, unspecified: Secondary | ICD-10-CM

## 2021-10-13 DIAGNOSIS — I5032 Chronic diastolic (congestive) heart failure: Secondary | ICD-10-CM

## 2021-10-13 DIAGNOSIS — Z9889 Other specified postprocedural states: Secondary | ICD-10-CM

## 2021-10-13 DIAGNOSIS — I25118 Atherosclerotic heart disease of native coronary artery with other forms of angina pectoris: Secondary | ICD-10-CM | POA: Diagnosis not present

## 2021-10-13 DIAGNOSIS — I4821 Permanent atrial fibrillation: Secondary | ICD-10-CM

## 2021-10-13 DIAGNOSIS — E1151 Type 2 diabetes mellitus with diabetic peripheral angiopathy without gangrene: Secondary | ICD-10-CM

## 2021-10-13 MED ORDER — ISOSORBIDE MONONITRATE ER 30 MG PO TB24: 30.0000 mg | ORAL_TABLET | Freq: Every day | ORAL | 3 refills | Status: DC

## 2021-10-13 NOTE — Patient Instructions (Signed)
Medication Instructions:  Start Isosorbide 30 mg daily Continue all other medications *If you need a refill on your cardiac medications before your next appointment, please call your pharmacy*   Lab Work: Cbc,cmet,tsh,lipid panel today   Testing/Procedures: None ordered   Follow-Up: At [facility], you and your health needs are our priority.  As part of our continuing mission to provide you with exceptional heart care, we have created designated Provider Care Teams.  These Care Teams include your primary Cardiologist (physician) and Advanced Practice Providers (APPs -  Physician Assistants and Nurse Practitioners) who all work together to provide you with the care you need, when you need it.  We recommend signing up for the patient portal called "MyChart".  Sign up information is provided on this After Visit Summary.  MyChart is used to connect with patients for Virtual Visits (Telemedicine).  Patients are able to view lab/test results, encounter notes, upcoming appointments, etc.  Non-urgent messages can be sent to your provider as well.   To learn more about what you can do with MyChart, go to [email or web address].    Your next appointment:  Thursday  10/19 at 9:00 am    The format for your next appointment:  Office     Provider:  [place]

## 2021-10-14 LAB — TSH: TSH: 3.21 u[IU]/mL (ref 0.450–4.500)

## 2021-10-14 LAB — COMPREHENSIVE METABOLIC PANEL
ALT: 11 IU/L (ref 0–44)
AST: 18 IU/L (ref 0–40)
Albumin/Globulin Ratio: 1.6 (ref 1.2–2.2)
Albumin: 4.4 g/dL (ref 3.8–4.8)
Alkaline Phosphatase: 81 IU/L (ref 44–121)
BUN/Creatinine Ratio: 13 (ref 10–24)
BUN: 22 mg/dL (ref 8–27)
Bilirubin Total: 0.7 mg/dL (ref 0.0–1.2)
CO2: 28 mmol/L (ref 20–29)
Calcium: 9.5 mg/dL (ref 8.6–10.2)
Chloride: 104 mmol/L (ref 96–106)
Creatinine, Ser: 1.69 mg/dL — ABNORMAL HIGH (ref 0.76–1.27)
Globulin, Total: 2.7 g/dL (ref 1.5–4.5)
Glucose: 76 mg/dL (ref 70–99)
Potassium: 4.3 mmol/L (ref 3.5–5.2)
Sodium: 146 mmol/L — ABNORMAL HIGH (ref 134–144)
Total Protein: 7.1 g/dL (ref 6.0–8.5)
eGFR: 41 mL/min/{1.73_m2} — ABNORMAL LOW (ref >59)

## 2021-10-14 LAB — CBC WITH DIFFERENTIAL/PLATELET
Basophils Absolute: 0.1 10*3/uL (ref 0.0–0.2)
Basos: 1 %
EOS (ABSOLUTE): 0.2 10*3/uL (ref 0.0–0.4)
Eos: 3 %
Hematocrit: 41.3 % (ref 37.5–51.0)
Hemoglobin: 13.1 g/dL (ref 13.0–17.7)
Immature Grans (Abs): 0 10*3/uL (ref 0.0–0.1)
Immature Granulocytes: 0 %
Lymphocytes Absolute: 2.6 10*3/uL (ref 0.7–3.1)
Lymphs: 28 %
MCH: 28.5 pg (ref 26.6–33.0)
MCHC: 31.7 g/dL (ref 31.5–35.7)
MCV: 90 fL (ref 79–97)
Monocytes Absolute: 0.8 10*3/uL (ref 0.1–0.9)
Monocytes: 9 %
Neutrophils Absolute: 5.4 10*3/uL (ref 1.4–7.0)
Neutrophils: 59 %
Platelets: 209 10*3/uL (ref 150–450)
RBC: 4.59 x10E6/uL (ref 4.14–5.80)
RDW: 14.3 % (ref 11.6–15.4)
WBC: 9.1 10*3/uL (ref 3.4–10.8)

## 2021-10-14 LAB — LIPID PANEL
Chol/HDL Ratio: 2.9 ratio (ref 0.0–5.0)
Cholesterol, Total: 112 mg/dL (ref 100–199)
HDL: 39 mg/dL — ABNORMAL LOW (ref >39)
LDL Chol Calc (NIH): 58 mg/dL (ref 0–99)
Triglycerides: 70 mg/dL (ref 0–149)
VLDL Cholesterol Cal: 15 mg/dL (ref 5–40)

## 2021-10-20 ENCOUNTER — Other Ambulatory Visit: Payer: Self-pay | Admitting: Internal Medicine

## 2021-10-20 DIAGNOSIS — E1151 Type 2 diabetes mellitus with diabetic peripheral angiopathy without gangrene: Secondary | ICD-10-CM

## 2021-10-23 ENCOUNTER — Other Ambulatory Visit: Payer: Self-pay | Admitting: Internal Medicine

## 2021-10-23 DIAGNOSIS — E1151 Type 2 diabetes mellitus with diabetic peripheral angiopathy without gangrene: Secondary | ICD-10-CM

## 2021-10-27 DIAGNOSIS — N1831 Chronic kidney disease, stage 3a: Secondary | ICD-10-CM | POA: Diagnosis not present

## 2021-10-28 ENCOUNTER — Other Ambulatory Visit: Payer: Self-pay | Admitting: Internal Medicine

## 2021-10-28 DIAGNOSIS — E785 Hyperlipidemia, unspecified: Secondary | ICD-10-CM

## 2021-10-29 ENCOUNTER — Telehealth: Payer: Self-pay | Admitting: Pharmacist

## 2021-10-29 NOTE — Progress Notes (Signed)
Chronic Care Management Pharmacy Assistant   Name: Jason Fisher  MRN: [id number] DOB: 1940-08-28  Reason for Encounter: Medication Review Medication Coordination    Recent office visits:  None  Recent consult visits:  10/13/21 Martinique, Peter M, MD (Cardiology) - Patient presented for Coronary Artery disease of native artery heart with stable angina pectoris and other concerns. Prescribed Isosorbide Monitrate 30 mg.  Hospital visits:  None in previous 6 months  Medications: Outpatient Encounter Medications as of 10/29/2021  Medication Sig   ACCU-CHEK AVIVA PLUS test strip CHECK BLOOD SUGAR ONCE DAILY. DX E11.51   atorvastatin (LIPITOR) 40 MG tablet TAKE ONE TABLET BY MOUTH ONCE DAILY   Continuous Blood Gluc Receiver (DEXCOM G6 RECEIVER) DEVI CHECK VLOOD SUGAR AS DIRECTED AS DIRECTED   Continuous Blood Gluc Sensor (DEXCOM G6 SENSOR) MISC APPLY 1 SENSOR EVERY 10 DAYS   Continuous Blood Gluc Transmit (DEXCOM G6 TRANSMITTER) MISC APPLY TO SENSOR EVERY 90 DAYS   diltiazem (CARDIZEM CD) 120 MG 24 hr capsule TAKE ONE CAPSULE BY MOUTH ONCE DAILY   ELIQUIS 5 MG TABS tablet TAKE ONE TABLET BY MOUTH AT BREAKFAST AND AT BEDTIME   furosemide (LASIX) 80 MG tablet TAKE ONE TABLET BY MOUTH ONCE DAILY   Glycopyrrolate-Formoterol (BEVESPI AEROSPHERE) 9-4.8 MCG/ACT AERO TAKE 2 PUFFS BY MOUTH TWICE A DAY   hydrALAZINE (APRESOLINE) 50 MG tablet Take 1 tablet (50 mg total) by mouth 3 (three) times daily. (Patient taking differently: Take 50 mg by mouth. Half tab daily)   isosorbide mononitrate (IMDUR) 30 MG 24 hr tablet Take 1 tablet (30 mg total) by mouth daily.   levalbuterol (XOPENEX HFA) 45 MCG/ACT inhaler INHALE 1 TO 2 PUFFS BY MOUTH EVERY 6 HOURS AS NEEDED FOR WHEEZE   metFORMIN (GLUCOPHAGE) 1000 MG tablet TAKE ONE TABLET BY MOUTH AT BREAKFAST AND AT BEDTIME   metoprolol tartrate (LOPRESSOR) 100 MG tablet Take 1 tablet (100 mg total) by mouth 2 (two) times daily.   nitroGLYCERIN (NITROSTAT) 0.4 MG  SL tablet Place 1 tablet (0.4 mg total) under the tongue every 5 (five) minutes as needed for chest pain. X 3 doses   olmesartan (BENICAR) 5 MG tablet Take 10 mg by mouth daily.   polyethylene glycol (MIRALAX / GLYCOLAX) packet Take 17 g by mouth daily as needed for mild constipation.   TRESIBA FLEXTOUCH 100 UNIT/ML FlexTouch Pen INJECT 32 UNITS into THE SKIN AT BEDTIME   No facility-administered encounter medications on file as of 10/29/2021.   Reviewed chart for medication changes ahead of medication coordination call.  BP Readings from Last 3 Encounters:  10/13/21 130/70  08/20/21 110/80  05/19/21 120/70    Lab Results  Component Value Date   HGBA1C 5.9 (A) 08/20/2021     Patient obtains medications through Adherence Packaging  30 Days   Last adherence delivery included:  Metformin (Glucophage)1000 mg : Take one tablet at Breakfast and at bedtime Hydralazine (Apresoline) 25 mg : Take one tablet at Breakfast  Eliquis 5 mg : Take one tablet at Breakfast and at Bedtime Furosemide  (Lasix) 80 mg : Take one tablet at Breakfast Metoprolol (Lopressor) 100 mg : Take one tablet at Breakfast and at Dinner Atorvastatin (Lipitor) 40 mg : Take one tablet at Breakfast Diltiazem (Cardizem) 120 mg : Take one tablet at Breakfast Olmesartan 5 mg: Take 2 tabs(10 mg) daily at breakfast   [name] on auto fill and due sent on 09/23/21    Unable to reach to confirm delivery date of  10/09/21, left voicemail that pharmacy will contact them the morning of delivery.     Patient is due for next adherence delivery on: 11/10/21. Called patient and reviewed medications and coordinated delivery. Packs 30 DS  This delivery to include: Metformin (Glucophage)1000 mg : Take one tablet at Breakfast and at bedtime Hydralazine (Apresoline) 25 mg : Take one tablet at Breakfast  Eliquis 5 mg : Take one tablet at Breakfast and at Bedtime Furosemide  (Lasix) 80 mg : Take one tablet at Breakfast Metoprolol  (Lopressor) 100 mg : Take one tablet at Breakfast and at Dinner Atorvastatin (Lipitor) 40 mg : Take one tablet at Breakfast Diltiazem (Cardizem) 120 mg : Take one tablet at Breakfast Olmesartan 5 mg: Take 2 tabs(10 mg) daily at breakfast Isosorbide Mono 30 mg: Take one at breakfast (wife aware of addition) Tresiba sent to pt on 10/23/21 wife confirms receipt   Patient declined the following medications  Nitroglycerin 0.4 (Sublingual) ; 1 tab every 5 min as needed (abundance)   Confirmed delivery date of 11/10/21, advised patient that pharmacy will contact them the morning of delivery.   Care Gaps: Foot Exam - Overdue Flu Vaccine - Overdue Eye Exam - Postponed COVID Booster - Postponed Zoster Vaccine - Postponed BP- 130/70 10/13/21 AWV- 12/19 office aware to sched as of 8/22 Lab Results  Component Value Date   HGBA1C 5.9 (A) 08/20/2021    Star Rating Drugs: Metformin (Glucophage) 1000 mg - Last filled 10/06/21 30 DS at [facility] Atorvastatin (Lipitor) 40 mg Last filled 10/06/21 30 DS at [facility] Pharmacist Assistant [phone]

## 2021-11-05 DIAGNOSIS — N1831 Chronic kidney disease, stage 3a: Secondary | ICD-10-CM | POA: Diagnosis not present

## 2021-11-05 DIAGNOSIS — I4891 Unspecified atrial fibrillation: Secondary | ICD-10-CM | POA: Diagnosis not present

## 2021-11-05 DIAGNOSIS — J449 Chronic obstructive pulmonary disease, unspecified: Secondary | ICD-10-CM | POA: Diagnosis not present

## 2021-11-05 DIAGNOSIS — I129 Hypertensive chronic kidney disease with stage 1 through stage 4 chronic kidney disease, or unspecified chronic kidney disease: Secondary | ICD-10-CM | POA: Diagnosis not present

## 2021-11-05 DIAGNOSIS — I5030 Unspecified diastolic (congestive) heart failure: Secondary | ICD-10-CM | POA: Diagnosis not present

## 2021-11-05 DIAGNOSIS — E1122 Type 2 diabetes mellitus with diabetic chronic kidney disease: Secondary | ICD-10-CM | POA: Diagnosis not present

## 2021-11-10 ENCOUNTER — Telehealth: Payer: Self-pay | Admitting: Cardiology

## 2021-11-10 NOTE — Telephone Encounter (Signed)
Returned the call to the patient's wife, per dpr. She stated that the patient has been having lower blood pressures for the past week. On Sunday, he was symptomatic with dizziness and his blood pressure was 96/56.  This morning before his medications his blood pressure was 151/99. While on the phone it was 128/70. The wife stated that he was doing fine today and denied any dizziness.  He is currently taking: Diltiazem 120 mg once daily Furosemide 80 mg once daily Imdur 30 mg once daily Metoprolol Tartrate 100 mg bid Olmesartan 10 mg once daily  His nephrologist discontinued his Hydralazine last week.  The patient has a follow up with Dr. Martinique on 10/19. The wife has been advised to check the patient's blood pressure 1-2 hours after taking his medications and to keep a log of these.

## 2021-11-10 NOTE — Telephone Encounter (Signed)
  Pt c/o BP issue: STAT if pt c/o blurred vision, one-sided weakness or slurred speech  1. What are your last 5 BP readings?  Today - 151/99 Yesterday 110/48, 113/61, 124/81 10/08 - 96/56, 40/56  2. Are you having any other symptoms (ex. Dizziness, headache, blurred vision, passed out)? Dizziness   3. What is your BP issue? Pt's wife said, last Sunday pt had an episode where his BP dropped as low as 40/56 and was too dizzy to get out of their car when going to church.

## 2021-11-10 NOTE — Telephone Encounter (Signed)
Spoke to patient's wife Dr.Jordan advised to hold Hydralazine.Continue all other medications.Advised to continue to monitor B/P and bring readings to Dr.Jordan's appointment 10/19.Advised to call sooner if needed.

## 2021-11-16 NOTE — Progress Notes (Unsigned)
Cardiology Office Note    Date:  11/20/2021   ID:  Jason Fisher, DOB [birth year], MRN [id number]  PCP:  Jason Fisher, Jason Halsted, MD  Cardiologist:  Dr. Martinique   Chief Complaint  Patient presents with   Coronary Artery Disease   Congestive Heart Failure    History of Present Illness:  Jason Fisher is a 81 y.o. male seen for evaluation of SOB and fatigue. He has a PMH of CAD (s/p BMS to RCA 2007, ISR with DES to RCA in 2009, DES to OM2 in 09/2015), chronic atrial fibrillation on Eliquis, chronic diastolic heart failure, HTN, HLD, AAA, COPD, and tobacco use.  He was previously admitted in March 2018 for atrial fibrillation with RVR.  Troponin was borderline elevated.  He was initially treated with IV Cardizem and later switched to p.o. Cardizem prior to discharge.  He also underwent AAA endovascular stent grafting by Dr. Trula Fisher in June 2018.  He was seen in October 2019 by Jason Deforest PA-C for increased edema.  Lasix was increased to 80 mg twice daily for 3 days before decreasing back down to 40 mg twice daily thereafter.  Echocardiogram on 11/05/2017 this showed EF 60 to 65%, mildly increased aortic root measuring 40 mm, moderately dilated left and the right atrium.   When seen in September 2021 he was not feeling well. He has a lot of congestion in his chest with clear phlegm. Had been using Dayquil. He was more SOB. No increase in edema or weight. He  felt very tired and is dizzy. Had one episode of chest pain relieved with sl Ntg. He was found to be markedly bradycardic with HR 42. Diltiazem was stopped and metoprolol dose reduced. On follow up one week later HR was 119 in Afib. Metoprolol was increased back to 100 mg bid. CXR showed mild pleural effusions suggestive of mild CHF. Emphysema.   He was admitted in April 2022 with worsening dyspnea on exertion.  While in the emergency room, blood pressure was 207/109.  He was admitted for acute on chronic diastolic heart failure and A. fib with  RVR.  Echocardiogram obtained during that admission showed EF 50 to 55%, no regional wall motion abnormality, moderate LVH.  He was eventually released on the prior home dose of Lasix 80 mg daily.  Cardizem was added back every 6 hours and he was eventually transitioned to 120 mg daily of Cardizem CD.  AAA ultrasound obtained in June 2022 showed a stable sac measuring 3.98 in transverse dimension. When seen in September Afib rate was controlled. TFTs were normal. He was started on hydralazine for BP.   He was seen last month. He notes that he can't do anything due to SOB. Has cough with white phlegm. He noted some chest discomfort on 3-4 occasions described as squeezing or tight. Has taken sl Ntg. Last < 15 minutes. Feels dizzy at times. He has lost 4 lbs. A1c in July was 5.9%. He was started on Imdur and is seen to follow up on his symptoms.   10 days ago wife called noting he was lightheaded and BP dropped to 96/56. Hydralazine was held. Since then BP has been well controlled with only a couple of high readings. His lightheadedness has resolved. He denies any chest tightness now. Still notes SOB going up stairs - this hasn't changed. No edema and weight is stable.    Past Medical History:  Diagnosis Date   ABDOMINAL AORTIC ANEURYSM 07/13/2008   BENIGN  PROSTATIC HYPERTROPHY, HX OF 02/24/2007   CAD S/P PCI    a. 2007 Inf STEMI s/p Vision BMS  2 to RCA;  b. 2009 ISR Prox RCA Stent-->with DES;  c. 09/2015 PCI: LM nl, LAD 30p/m, D1 90, LCX nl, OM1 small, 80, OM2 90 (2.5x14 Biofreedom stent), OM3 small, RCA 30p ISR, 49mISR, 451m85d, EF 50-55%.   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 On Eliquis (CHA2DS2VASc = 5).  Rate controlled w/ BB and CCB.   Chronic diastolic CHF (congestive heart failure) ([facility]   a. 09/2015 Echo: EF 55-60%, mildly dil LA.   CKD (chronic kidney disease), stage III ([facility]   they mentioned this to the patient but they would work on  it later after the aneurysm   COLONIC POLYPS, HX OF 11/02/2006   COPD (chronic obstructive pulmonary disease) ([facility]   DIABETES MELLITUS, TYPE II 10/29/2006   Diverticulosis    History of tobacco abuse    a. Quit 09/2015.   HYPERLIPIDEMIA 10/29/2006   Hypertension    Hypertensive heart disease with heart failure ([facility]   Pneumonia 09/2015   /nArchie Endo/29/2018   STEM[facility]ST elevation myocardial infarction) [facility]   /nArchie Endo/29/2018    Past Surgical History:  Procedure Laterality Date   ABDOMINAL AORTIC ENDOVASCULAR STENT GRAFT N/A 07/31/2016   Procedure: ABDOMINAL AORTIC ENDOVASCULAR STENT GRAFT;  Surgeon: BrSerafina MitchellMD;  [facility] Service: Vascular;  Laterality: N/A;   CARDIAC CATHETERIZATION N/A 09/26/2015   Procedure: Left Heart Cath and Coronary Angiography;  Surgeon: Zineb Glade M JoMartiniqueMD;  [place] [facility];  Service: Cardiovascular;  Laterality: N/A;   CARDIAC CATHETERIZATION N/A 09/26/2015   Procedure: Coronary Stent Intervention;  Surgeon: Laloni Rowton M JoMartiniqueMD;  [facility];  Service: Cardiovascular;  Laterality: N/A;   CATARACT EXTRACTION Bilateral    COLONOSCOPY     CORONARY ANGIOPLASTY WITH STENT PLACEMENT  2007   Inferior STEMI 2007 - RCA PCI with 2 Vision BMS; Abnormal Myoview in 2009 - pRCA ins-stent restenosis & unstented disease - DES PCI Promus 3.0 x 20 mm (3.5 mm)   ENDOVASCULAR REPAIR/STENT GRAFT  07/31/2016   INCISION AND DRAINAGE PERIRECTAL ABSCESS N/A 11/06/2015   Procedure: IRRIGATION AND DEBRIDEMENT PERIRECTAL ABSCESS, POSSIBLE HEMORRHOIDECTOMY;  Surgeon: DoCoralie KeensMD;  [place] [facility] Service: General;  Laterality: N/A;   ORIF SHOULDER FRACTURE Right    "fell out of back end of a truck"    Current Medications: Outpatient Medications Prior to Visit  Medication Sig Dispense Refill   ACCU-CHEK AVIVA PLUS test strip CHECK BLOOD SUGAR ONCE DAILY. DX E11.51 100 strip 2   atorvastatin (LIPITOR) 40 MG tablet TAKE ONE  TABLET BY MOUTH ONCE DAILY 90 tablet 1   Continuous Blood Gluc Receiver (DEXCOM G6 RECEIVER) DEVI CHECK VLOOD SUGAR AS DIRECTED AS DIRECTED 1 each 0   Continuous Blood Gluc Sensor (DEXCOM G6 SENSOR) MISC APPLY 1 SENSOR EVERY 10 DAYS 3 each 2   Continuous Blood Gluc Transmit (DEXCOM G6 TRANSMITTER) MISC APPLY TO SENSOR EVERY 90 DAYS 1 each 0   diltiazem (CARDIZEM CD) 120 MG 24 hr capsule TAKE ONE CAPSULE BY MOUTH ONCE DAILY 90 capsule 1   ELIQUIS 5 MG TABS tablet TAKE ONE TABLET BY MOUTH AT BREAKFAST AND AT BEDTIME 180 tablet 1   furosemide (LASIX) 80 MG tablet TAKE ONE TABLET BY MOUTH ONCE DAILY 90  tablet 1   Glycopyrrolate-Formoterol (BEVESPI AEROSPHERE) 9-4.8 MCG/ACT AERO TAKE 2 PUFFS BY MOUTH TWICE A DAY 10.7 g 11   isosorbide mononitrate (IMDUR) 30 MG 24 hr tablet Take 1 tablet (30 mg total) by mouth daily. 90 tablet 3   levalbuterol (XOPENEX HFA) 45 MCG/ACT inhaler INHALE 1 TO 2 PUFFS BY MOUTH EVERY 6 HOURS AS NEEDED FOR WHEEZE 15 each 3   metFORMIN (GLUCOPHAGE) 1000 MG tablet TAKE ONE TABLET BY MOUTH AT BREAKFAST AND AT BEDTIME 180 tablet 1   metoprolol tartrate (LOPRESSOR) 100 MG tablet Take 1 tablet (100 mg total) by mouth 2 (two) times daily. 180 tablet 3   nitroGLYCERIN (NITROSTAT) 0.4 MG SL tablet Place 1 tablet (0.4 mg total) under the tongue every 5 (five) minutes as needed for chest pain. X 3 doses 25 tablet 4   olmesartan (BENICAR) 5 MG tablet Take 10 mg by mouth daily.     polyethylene glycol (MIRALAX / GLYCOLAX) packet Take 17 g by mouth daily as needed for mild constipation.     TRESIBA FLEXTOUCH 100 UNIT/ML FlexTouch Pen INJECT 32 UNITS into THE SKIN AT BEDTIME 15 mL 2   No facility-administered medications prior to visit.     Allergies:   Contrast media [iodinated contrast media]   Social History   Socioeconomic History   Marital status: Married    Spouse name: Not on file   Number of children: 5   Years of education: Not on file   Highest education level: Not on file   Occupational History   Occupation: [employer]  Tobacco Use   Smoking status: Some Days    Packs/day: 1.00    Years: 49.00    Total pack years: 49.00    Types: Cigarettes    Last attempt to quit: 09/08/2015    Years since quitting: 6.2   Smokeless tobacco: Never   Tobacco comments:    passive smoker as well    11/20/2021 patient states he smokes a couple cigarettes a day  Vaping Use   Vaping Use: Never used  Substance and Sexual Activity   Alcohol use: No    Alcohol/week: 0.0 standard drinks of alcohol   Drug use: No   Sexual activity: Not on file  Other Topics Concern   Not on file  Social History Narrative   Not on file   Social Determinants of Health   Financial Resource Strain: Medium Risk (09/23/2020)   Overall Financial Resource Strain (CARDIA)    Difficulty of Paying Living Expenses: Somewhat hard  Food Insecurity: No Food Insecurity (11/19/2021)   Hunger Vital Sign    Worried About Running Out of Food in the Last Year: Never true    Ran Out of Food in the Last Year: Never true  Transportation Needs: No Transportation Needs (11/19/2021)   PRAPARE - Hydrologist (Medical): No    Lack of Transportation (Non-Medical): No  Physical Activity: Sufficiently Active (06/08/2019)   Exercise Vital Sign    Days of Exercise per Week: 3 days    Minutes of Exercise per Session: 60 min  Stress: Not on file  Social Connections: Not on file     Family History:  The patient's family history includes Diabetes in his brother and mother; Heart attack in his brother, father, and mother; Hypertension in his father.   ROS:   Please see the history of present illness.    ROS All other systems reviewed and are negative.   PHYSICAL  EXAM:   VS:  BP 136/80 (BP Location: Right Arm, Patient Position: Sitting)   Pulse 80   Ht '5\' 8"'$  (1.727 m)   Wt 166 lb (75.3 kg)   SpO2 98%   BMI 25.24 kg/m    GENERAL:  Chronically ill appearing WM in NAD HEENT:  PERRL,  EOMI, sclera are clear. Oropharynx is clear. NECK:  No jugular venous distention, carotid upstroke brisk and symmetric, no bruits, no thyromegaly or adenopathy LUNGS:  diminished BS bilaterally no active wheezing. CHEST:  Unremarkable HEART:  IRRR,  PMI not displaced or sustained,S1 and S2 within normal limits, no S3, no S4: no clicks, no rubs, no murmurs ABD:  Soft, nontender. BS +, no masses or bruits. No hepatomegaly, no splenomegaly EXT:  2 + pulses throughout, no edema, no cyanosis no clubbing SKIN:  Warm and dry.  No rashes NEURO:  Alert and oriented x 3. Cranial nerves II through XII intact. PSYCH:  Cognitively intact    Wt Readings from Last 3 Encounters:  11/20/21 166 lb (75.3 kg)  10/13/21 165 lb 6.4 oz (75 kg)  08/20/21 169 lb 3.2 oz (76.7 kg)         Studies/Labs Reviewed:   EKG:  EKG is not  ordered today.      Recent Labs: 10/13/2021: ALT 11; BUN 22; Creatinine, Ser 1.69; Hemoglobin 13.1; Platelets 209; Potassium 4.3; Sodium 146; TSH 3.210   Lipid Panel    Component Value Date/Time   CHOL 112 10/13/2021 1119   TRIG 70 10/13/2021 1119   HDL 39 (L) 10/13/2021 1119   CHOLHDL 2.9 10/13/2021 1119   CHOLHDL 3 02/10/2021 1404   VLDL 18.8 02/10/2021 1404   LDLCALC 58 10/13/2021 1119    Additional studies/ records that were reviewed today include:   Cath 09/26/2015 Prox LAD to Mid LAD lesion, 30 %stenosed. 1st Diag lesion, 90 %stenosed. Ost 1st Mrg to 1st Mrg lesion, 80 %stenosed. Ost RCA to Prox RCA lesion, 30 %stenosed. Mid RCA-2 lesion, 40 %stenosed. Mid RCA-1 lesion, 15 %stenosed. 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 There is mild left ventricular systolic dysfunction. The left ventricular ejection fraction is 50-55% by visual estimate. LV end diastolic pressure is moderately elevated. A drug eluting stent was successfully placed. 2nd Mrg lesion, 90 %stenosed. Post intervention, there is a 0% residual stenosis.   1. 3  vessel obstructive CAD.     - chronic severe stenosis in the first diagonal    - New 90% stenosis in a large second OM    - Stents in the proximal and mid RCA are patent    - Aneurysm and stenosis in the PLOM branch of the RCA- the aneurysm is new compared to 2009 2. Mild LV dysfunction 3. Elevated LVEDP 4. Allergic response to contrast with hives and bronchospasm. 5. Successful stenting of the second OM with a Biofreedom stent.    Plan: continue IV diuresis. Patient received steroids, Benadryl, and IV pepcid for contrast reaction. Continue DAPT for one month then DC ASA. Resume Eliquis in am.        Echo 11/05/2017 LV EF: 60% -   65% Study Conclusions   - Left ventricle: The cavity size was normal. There was moderate   concentric hypertrophy. Systolic function was normal. The   estimated ejection fraction was in the range of 60% to 65%. Wall   motion was normal; there were no regional wall motion   abnormalities. The study was  not technically sufficient to allow   evaluation of LV diastolic dysfunction due to atrial   fibrillation. - Aortic valve: Poorly visualized. Trileaflet; moderately   thickened, moderately calcified leaflets. - Aorta: Aortic root dimension: 40 mm (ED). Ascending aortic   diameter: 38 mm (S). - Aortic root: The aortic root was mildly dilated. - Ascending aorta: The ascending aorta was mildly dilated. - Left atrium: The atrium was moderately dilated. - Right atrium: The atrium was moderately dilated. - Pulmonic valve: There was mild regurgitation. - Pulmonary arteries: Systolic pressure could not be accurately   estimated.     Echo 05/11/2020  1. Left ventricular ejection fraction, by estimation, is 50 to 55%. The  left ventricle has low normal function. The left ventricle has no regional  wall motion abnormalities. There is moderate left ventricular hypertrophy.  Left ventricular diastolic  function could not be evaluated.   2. Right ventricular  systolic function is normal. The right ventricular  size is normal. There is normal pulmonary artery systolic pressure.   3. Left atrial size was mildly dilated.   4. The mitral valve is grossly normal. Trivial mitral valve  regurgitation.   5. The aortic valve is grossly normal. Aortic valve regurgitation is  trivial.   6. The inferior vena cava is dilated in size with >50% respiratory  variability, suggesting right atrial pressure of 8 mmHg.     ASSESSMENT:    1. Coronary artery disease of native artery of native heart with stable angina pectoris ([place])   2. Chronic diastolic congestive heart failure (HCC)   3. Permanent atrial fibrillation (HCC)   4. Stage 3 chronic kidney disease, unspecified whether stage 3a or 3b CKD ([place])   5. Pulmonary emphysema, unspecified emphysema type ([place])       PLAN:  In order of problems listed above:  Chronic atrial fibrillation. Rate is well controlled.  No palpitations. Continue Eliquis. Continue rate control with metoprolol and dilitiazem.  CAD: s/p multiple interventions in the past- last in 2017. On diltiazem and lopressor. On statin. Noted some chest tightness before that has improved with addition of  Imdur 30 mg daily. He is not a candidate for CTA due to prior stents. Not a candidate for [facility] given severe COPD. If chest pain symptoms progress would need to consider repeat cardiac cath but patient had contrast reaction on last cath and would need pretreatment. For now doing better on medical therapy  Chronic diastolic CHF: volume status is good on current lasix dose 40 mg daily. No edema and weight is stable. Continue to use extra lasix PRN.   Hyperlipidemia: On Lipitor 40 mg daily.  LDL 66. Will update labs today.  History of AAA repair: followed by [facility].   6..  DM type 2 on metformin.  A1c- 5.9%  7.   Severe COPD - ongoing SOB. Recommend follow up with pulmonary  8. HTN. BP acceptable now on current regimen. Hydralazine  discontinued but to lightheadedness and low BP  Follow up in 6 months   Medication Adjustments/Labs and Tests Ordered: Current medicines are reviewed at length with the patient today.  Concerns regarding medicines are outlined above.  Medication changes, Labs and Tests ordered today are listed in the Patient Instructions below. There are no Patient Instructions on file for this visit.    Signed, Adryel Wortmann Martinique, MD  11/20/2021 9:27 AM    [facility] [place], [place], [place]  [address] Phone: [phone]; Fax: [phone]   

## 2021-11-19 ENCOUNTER — Encounter: Payer: Self-pay | Admitting: *Deleted

## 2021-11-19 ENCOUNTER — Telehealth: Payer: Self-pay | Admitting: *Deleted

## 2021-11-19 NOTE — Patient Outreach (Signed)
  Care Coordination   Initial Visit Note   11/19/2021 Name: DILLYN MENNA MRN: [id number] DOB: 25-Oct-1940  Jason Fisher is a 81 y.o. year old male who sees [name], [name], MD for primary care. I spoke with  [name] by phone today.  What matters to the patients health and wellness today?   NO NEEDS   Goals Addressed               This Visit's Progress     COMPLETED: No needs (pt-stated)        Care Coordination Interventions: Advised patient to schedule his AWV with his primary provider for this year Reviewed medications with patient and discussed adherence to all his medications with no needed refills Reviewed scheduled/upcoming provider appointments including pending appointments Assessed social determinant of health barriers          SDOH assessments and interventions completed:  Yes  SDOH Interventions Today    Flowsheet Row Most Recent Value  SDOH Interventions   Food Insecurity Interventions Intervention Not Indicated  Housing Interventions Intervention Not Indicated  Transportation Interventions Intervention Not Indicated        Care Coordination Interventions Activated:  Yes  Care Coordination Interventions:  Yes, provided   Follow up plan: No further intervention required.   Encounter Outcome:  Pt. Visit Completed   Raina Mina, RN Care Management Coordinator [place] Office [phone]

## 2021-11-19 NOTE — Patient Instructions (Signed)
Visit Information  Thank you for taking time to visit with me today. Please don't hesitate to contact me if I can be of assistance to you.   Following are the goals we discussed today:   Goals Addressed               This Visit's Progress     COMPLETED: No needs (pt-stated)        Care Coordination Interventions: Advised patient to schedule his AWV with his primary provider for this year Reviewed medications with patient and discussed adherence to all his medications with no needed refills Reviewed scheduled/upcoming provider appointments including pending appointments Assessed social determinant of health barriers          Please call the care guide team at [phone] if you need to cancel or reschedule your appointment.   If you are experiencing a Mental Health or [place] or need someone to talk to, please call the Suicide and Crisis Lifeline: 988  The patient verbalized understanding of instructions, educational materials, and care plan provided today and DECLINED offer to receive copy of patient instructions, educational materials, and care plan.   No further follow up required: No needs   Raina Mina, RN Care Management Coordinator [place] Office [phone]

## 2021-11-20 ENCOUNTER — Encounter: Payer: Self-pay | Admitting: Internal Medicine

## 2021-11-20 ENCOUNTER — Encounter: Payer: Self-pay | Admitting: Cardiology

## 2021-11-20 ENCOUNTER — Ambulatory Visit (INDEPENDENT_AMBULATORY_CARE_PROVIDER_SITE_OTHER): Payer: Medicare Other | Admitting: Internal Medicine

## 2021-11-20 ENCOUNTER — Ambulatory Visit: Payer: Medicare Other | Attending: Cardiology | Admitting: Cardiology

## 2021-11-20 VITALS — BP 160/86 | HR 76 | Temp 97.7°F | Wt 168.7 lb

## 2021-11-20 VITALS — BP 136/80 | HR 80 | Ht 68.0 in | Wt 166.0 lb

## 2021-11-20 DIAGNOSIS — F17200 Nicotine dependence, unspecified, uncomplicated: Secondary | ICD-10-CM

## 2021-11-20 DIAGNOSIS — N183 Chronic kidney disease, stage 3 unspecified: Secondary | ICD-10-CM

## 2021-11-20 DIAGNOSIS — I10 Essential (primary) hypertension: Secondary | ICD-10-CM

## 2021-11-20 DIAGNOSIS — I5033 Acute on chronic diastolic (congestive) heart failure: Secondary | ICD-10-CM | POA: Diagnosis not present

## 2021-11-20 DIAGNOSIS — Z9861 Coronary angioplasty status: Secondary | ICD-10-CM | POA: Diagnosis not present

## 2021-11-20 DIAGNOSIS — T508X5D Adverse effect of diagnostic agents, subsequent encounter: Secondary | ICD-10-CM | POA: Diagnosis not present

## 2021-11-20 DIAGNOSIS — I5032 Chronic diastolic (congestive) heart failure: Secondary | ICD-10-CM | POA: Diagnosis not present

## 2021-11-20 DIAGNOSIS — E785 Hyperlipidemia, unspecified: Secondary | ICD-10-CM | POA: Diagnosis not present

## 2021-11-20 DIAGNOSIS — J42 Unspecified chronic bronchitis: Secondary | ICD-10-CM

## 2021-11-20 DIAGNOSIS — I2510 Atherosclerotic heart disease of native coronary artery without angina pectoris: Secondary | ICD-10-CM

## 2021-11-20 DIAGNOSIS — I4821 Permanent atrial fibrillation: Secondary | ICD-10-CM

## 2021-11-20 DIAGNOSIS — J439 Emphysema, unspecified: Secondary | ICD-10-CM

## 2021-11-20 DIAGNOSIS — E1151 Type 2 diabetes mellitus with diabetic peripheral angiopathy without gangrene: Secondary | ICD-10-CM | POA: Diagnosis not present

## 2021-11-20 DIAGNOSIS — I25118 Atherosclerotic heart disease of native coronary artery with other forms of angina pectoris: Secondary | ICD-10-CM

## 2021-11-20 LAB — POCT GLYCOSYLATED HEMOGLOBIN (HGB A1C): Hemoglobin A1C: 6.3 % — AB (ref 4.0–5.6)

## 2021-11-20 NOTE — Progress Notes (Signed)
Established Patient Office Visit     CC/Reason for Visit: 20-monthfollow-up chronic medical conditions  HPI: Jason KEATSis a 81y.o. male who is coming in today for the above mentioned reasons. Past Medical History is significant for: Hypertension, hyperlipidemia, AAA, COPD, A. fib, stage III chronic kidney disease, heart failure with preserved ejection fraction, type 2 diabetes, coronary artery disease.  His main complaint continues to be dyspnea on exertion.  He has already followed up with cardiology.  They state next step would be a cath but they are hesitant to do this given his contrast allergy.  He has not seen his lung doctor in some time.  At this point he mainly follow-up with him for diabetes.   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 2007 Inf STEMI s/p Vision BMS  2 to RCA;  b. 2009 ISR Prox RCA Stent-->with DES;  c. 09/2015 PCI: LM nl, LAD 30p/m, D1 90, LCX nl, OM1 small, 80, OM2 90 (2.5x14 Biofreedom stent), OM3 small, RCA 30p ISR, 128mSR, 4039m5d, EF 50-55%.   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 On Eliquis (CHA2DS2VASc = 5).  Rate controlled w/ BB and CCB.   Chronic diastolic CHF (congestive heart failure) (HCC[place]  a. 09/2015 Echo: EF 55-60%, mildly dil LA.   CKD (chronic kidney disease), stage III ([place]CC[place]  they mentioned this to the patient but they would work on it later after the aneurysm   COLONIC POLYPS, HX OF 11/02/2006   COPD (chronic obstructive pulmonary disease) ([place]CC[place]  DIABETES MELLITUS, TYPE II 10/29/2006   Diverticulosis    History of tobacco abuse    a. Quit 09/2015.   HYPERLIPIDEMIA 10/29/2006   Hypertension    Hypertensive heart disease with heart failure (HCCHel[place]  Pneumonia 09/2015   /noArchie Endo29/2018   STEM[facility] (ST elevation myocardial infarction) ([facility]    /noArchie Endo29/2018    Past Surgical History:  Procedure Laterality Date   ABDOMINAL AORTIC ENDOVASCULAR STENT GRAFT N/A 07/31/2016   Procedure: ABDOMINAL AORTIC ENDOVASCULAR STENT GRAFT;  Surgeon: BraSerafina MitchellD;  [facility];  Laterality: N/A;   CARDIAC CATHETERIZATION N/A 09/26/2015   Procedure: Left Heart Cath and Coronary Angiography;  Surgeon: Peter M JorMartiniqueD;  [facility] [place] [facility];  Service: Cardiovascular;  Laterality: N/A;   CARDIAC CATHETERIZATION N/A 09/26/2015   Procedure: Coronary Stent Intervention;  Surgeon: Peter M JorMartiniqueD;  [facility] [place] [facility];  Service: Cardiovascular;  Laterality: N/A;   CATARACT EXTRACTION Bilateral    COLONOSCOPY     CORONARY ANGIOPLASTY WITH STENT PLACEMENT  2007   Inferior STEMI 2007 - RCA PCI with 2 Vision BMS; Abnormal Myoview in 2009 - pRCA ins-stent restenosis & unstented disease - DES PCI Promus 3.0 x 20 mm (3.5 mm)   ENDOVASCULAR REPAIR/STENT GRAFT  07/31/2016   INCISION AND DRAINAGE PERIRECTAL ABSCESS N/A 11/06/2015   Procedure: IRRIGATION AND DEBRIDEMENT PERIRECTAL ABSCESS, POSSIBLE HEMORRHOIDECTOMY;  Surgeon: DouCoralie KeensD;  [facility];  Laterality: N/A;   ORIF SHOULDER FRACTURE Right    "fell out of back end of a truck"    Social History:  reports that he has been smoking cigarettes. He has  a 49.00 pack-year smoking history. He has never used smokeless tobacco. He reports that he does not drink alcohol and does not use drugs.  Allergies: Allergies  Allergen Reactions   Contrast Media [Iodinated Contrast Media] Anaphylaxis    Family History:  Family History  Problem Relation Age of Onset   Heart attack Mother    Diabetes Mother    Heart attack Father    Hypertension Father    Heart attack Brother    Diabetes Brother      Current Outpatient Medications:    ACCU-CHEK AVIVA PLUS test strip, CHECK BLOOD SUGAR ONCE DAILY. DX E11.51, Disp: 100 strip,  Rfl: 2   atorvastatin (LIPITOR) 40 MG tablet, TAKE ONE TABLET BY MOUTH ONCE DAILY, Disp: 90 tablet, Rfl: 1   Continuous Blood Gluc Receiver (DEXCOM G6 RECEIVER) DEVI, CHECK VLOOD SUGAR AS DIRECTED AS DIRECTED, Disp: 1 each, Rfl: 0   Continuous Blood Gluc Sensor (DEXCOM G6 SENSOR) MISC, APPLY 1 SENSOR EVERY 10 DAYS, Disp: 3 each, Rfl: 2   Continuous Blood Gluc Transmit (DEXCOM G6 TRANSMITTER) MISC, APPLY TO SENSOR EVERY 90 DAYS, Disp: 1 each, Rfl: 0   diltiazem (CARDIZEM CD) 120 MG 24 hr capsule, TAKE ONE CAPSULE BY MOUTH ONCE DAILY, Disp: 90 capsule, Rfl: 1   ELIQUIS 5 MG TABS tablet, TAKE ONE TABLET BY MOUTH AT BREAKFAST AND AT BEDTIME, Disp: 180 tablet, Rfl: 1   furosemide (LASIX) 80 MG tablet, TAKE ONE TABLET BY MOUTH ONCE DAILY, Disp: 90 tablet, Rfl: 1   Glycopyrrolate-Formoterol (BEVESPI AEROSPHERE) 9-4.8 MCG/ACT AERO, TAKE 2 PUFFS BY MOUTH TWICE A DAY, Disp: 10.7 g, Rfl: 11   isosorbide mononitrate (IMDUR) 30 MG 24 hr tablet, Take 1 tablet (30 mg total) by mouth daily., Disp: 90 tablet, Rfl: 3   levalbuterol (XOPENEX HFA) 45 MCG/ACT inhaler, INHALE 1 TO 2 PUFFS BY MOUTH EVERY 6 HOURS AS NEEDED FOR WHEEZE, Disp: 15 each, Rfl: 3   metFORMIN (GLUCOPHAGE) 1000 MG tablet, TAKE ONE TABLET BY MOUTH AT BREAKFAST AND AT BEDTIME, Disp: 180 tablet, Rfl: 1   metoprolol tartrate (LOPRESSOR) 100 MG tablet, Take 1 tablet (100 mg total) by mouth 2 (two) times daily., Disp: 180 tablet, Rfl: 3   nitroGLYCERIN (NITROSTAT) 0.4 MG SL tablet, Place 1 tablet (0.4 mg total) under the tongue every 5 (five) minutes as needed for chest pain. X 3 doses, Disp: 25 tablet, Rfl: 4   olmesartan (BENICAR) 5 MG tablet, Take 10 mg by mouth daily., Disp: , Rfl:    polyethylene glycol (MIRALAX / GLYCOLAX) packet, Take 17 g by mouth daily as needed for mild constipation., Disp: , Rfl:    TRESIBA FLEXTOUCH 100 UNIT/ML FlexTouch Pen, INJECT 32 UNITS into THE SKIN AT BEDTIME, Disp: 15 mL, Rfl: 2  Review of Systems:  Constitutional:  Denies fever, chills, diaphoresis, appetite change. HEENT: Denies photophobia, eye pain, redness, hearing loss, ear pain, congestion, sore throat, rhinorrhea, sneezing, mouth sores, trouble swallowing, neck pain, neck stiffness and tinnitus.   Respiratory: Denies  chest tightness,  and wheezing.   Cardiovascular: Denies chest pain, palpitations and leg swelling.  Gastrointestinal: Denies nausea, vomiting, abdominal pain, diarrhea, constipation, blood in stool and abdominal distention.  Genitourinary: Denies dysuria, urgency, frequency, hematuria, flank pain and difficulty urinating.  Endocrine: Denies: hot or cold intolerance, sweats, changes in hair or nails, polyuria, polydipsia. Musculoskeletal: Denies myalgias, back pain, joint swelling, arthralgias and gait problem.  Skin: Denies pallor, rash and wound.  Neurological: Denies dizziness, seizures, syncope, weakness, light-headedness, numbness  and headaches.  Hematological: Denies adenopathy. Easy bruising, personal or family bleeding history  Psychiatric/Behavioral: Denies suicidal ideation, mood changes, confusion, nervousness, sleep disturbance and agitation    Physical Exam: Vitals:   11/20/21 1105 11/20/21 1108  BP: (!) 140/88 (!) 160/86  Pulse: 76   Temp: 97.7 F (36.5 C)   TempSrc: Oral   SpO2: 98%   Weight: 168 lb 11.2 oz (76.5 kg)     Body mass index is 25.65 kg/m.   Constitutional: NAD, calm, comfortable Eyes: PERRL, lids and conjunctivae normal ENMT: Mucous membranes are moist.  Respiratory: clear to auscultation bilaterally, no wheezing, no crackles. Normal respiratory effort. No accessory muscle use.  Cardiovascular: Regular rate and rhythm, no murmurs / rubs / gallops. No extremity edema.  Psychiatric: Normal judgment and insight. Alert and oriented x 3. Normal mood.    Impression and Plan:  DM (diabetes mellitus), type 2 with peripheral vascular complications ([place]) - Plan: POCT glycosylated hemoglobin (Hb  A1C)  Essential hypertension  Dyslipidemia  Chronic bronchitis, unspecified chronic bronchitis type (HCC)  Allergic reaction to contrast material, subsequent encounter  Acute on chronic diastolic congestive heart failure ([place])  TOBACCO USER  CAD S/P percutaneous coronary angioplasty  -BP is elevated today after hydralazine Dc'd by cards due to hypotension.  Will not make any changes today. -A1c shows excellent diabetic control at 6.3, no changes today. -He continues to have dyspnea on exertion, he has already checked in with cardiology, have advised patient and wife to call his pulmonologist. -He declined in office flu and COVID vaccinations today despite counseling.   Time spent:30 minutes reviewing chart, interviewing and examining patient and formulating plan of care.       Lelon Frohlich, MD [facility]

## 2021-11-25 ENCOUNTER — Encounter: Payer: Self-pay | Admitting: Emergency Medicine

## 2021-11-25 ENCOUNTER — Ambulatory Visit: Payer: Medicare Other | Admitting: Emergency Medicine

## 2021-11-25 DIAGNOSIS — J42 Unspecified chronic bronchitis: Secondary | ICD-10-CM

## 2021-11-25 DIAGNOSIS — F17200 Nicotine dependence, unspecified, uncomplicated: Secondary | ICD-10-CM

## 2021-11-25 NOTE — Assessment & Plan Note (Signed)
Smoking.  Discussed cessation with him today.  He is not ready to commit to a quit date

## 2021-11-25 NOTE — Assessment & Plan Note (Signed)
We will temporarily stop Bevespi Try starting Stiolto 2 puffs once daily.  Keep track of whether this medication helps you more.  If so let us know and we will send a prescription to your pharmacy. Keep your levalbuterol (Xopenex) available to use 2 puffs up to every 4 hours if needed for shortness of breath, chest tightness, wheezing. Get the flu shot as planned You would benefit from getting the COVID-19 vaccine this fall You would benefit from getting the RSV vaccine this fall Follow-up with APP in 6 months or sooner if you have any problems Follow with Dr. Lamonte Sakai in 12 months or sooner if you have any problems.

## 2021-11-25 NOTE — Progress Notes (Signed)
Subjective:    Patient ID: Jason Fisher, male    DOB: 08/11/1940, 81 y.o.   MRN: [id number]  COPD His past medical history is significant for COPD.    ROV 06/20/20 --follow-up visit for Jason Fisher.  He is a 81 year old man with a history of very severe COPD.  Also with remote nodular disease that resolved on CT scans of the chest.  Past history significant for CAD, chronic A. fib, hypertension with chronic diastolic CHF, diabetes.  We were managing him on Bevespi and he was using Xopenex as needed.  He was admitted to the hospital in early April with what was treated as an acute exacerbation of congestive heart failure. This was apparently sudden onset, associated with hypertension. No wheeze, some cough that was non-productive.   He is improved. Has some daily cough with white / clear mucous. Uses xopenex 4-5x a week, often with climbing stairs or w a long walk. He did not desat last June on walk here. He feels that his breathing is about the same.  He is smoking about 1 cig a day.  COVID vaccine up to date.   ROV 11/25/21 --81 year old gentleman with a history of very severe COPD, CAD, chronic atrial fibrillation, hypertension with diastolic CHF, diabetes.  He also had remote pulmonary nodular disease that we had followed by CT chest and established stability.  We have been managing him on Bevespi.  He reports that his exertional tolerance has been lower - gets very fatigued when walking an extended distance, sometimes associated with hypotension. He has been on Imdur for a couple months and has had an improvement in NTG use, as well as CP in general. He has some wheeze at night when he lays down, has cough at night when he lays down - clears mucous some nights. He does not believe that he can feel the bevespi getting into his chest   Review of Systems As per HPI  Past Medical History:  Diagnosis Date   ABDOMINAL AORTIC ANEURYSM 07/13/2008   BENIGN PROSTATIC HYPERTROPHY, HX OF 02/24/2007    CAD S/P PCI    a. 2007 Inf STEMI s/p Vision BMS  2 to RCA;  b. 2009 ISR Prox RCA Stent-->with DES;  c. 09/2015 PCI: LM nl, LAD 30p/m, D1 90, LCX nl, OM1 small, 80, OM2 90 (2.5x14 Biofreedom stent), OM3 small, RCA 30p ISR, 71mISR, 468m85d, EF 50-55%.   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 On Eliquis (CHA2DS2VASc = 5).  Rate controlled w/ BB and CCB.   Chronic diastolic CHF (congestive heart failure) (HCYal[place]   a. 09/2015 Echo: EF 55-60%, mildly dil LA.   CKD (chronic kidney disease), stage III (HCClaib[place]   they mentioned this to the patient but they would work on it later after the aneurysm   COLONIC POLYPS, HX OF 11/02/2006   COPD (chronic obstructive pulmonary disease) (HCCoker   DIABETES MELLITUS, TYPE II 10/29/2006   Diverticulosis    History of tobacco abuse    a. Quit 09/2015.   HYPERLIPIDEMIA 10/29/2006   Hypertension    Hypertensive heart disease with heart failure (HCRossville   Pneumonia 09/2015   /nArchie Endo/29/2018   STEMI (ST elevation myocardial infarction) (HCEverson2007   /notes 07/31/2016         Objective:   Physical Exam Vitals:   11/25/21 1603  BP: 110/76  Pulse: 61  SpO2: 96%  Weight:  165 lb 12.8 oz (75.2 kg)  Height: '5\' 8"'$  (1.727 m)   Gen: Pleasant, well-nourished elderly man, in no distress,  normal affect  ENT: No lesions,  mouth clear,  oropharynx clear, no postnasal drip  Neck: No JVD, no stridor  Lungs: No use of accessory muscles, distant, clear without rales or rhonchi. No wheeze on forced exp  Cardiovascular: RRR, heart sounds normal, no murmur or gallops, no peripheral edema  Musculoskeletal: No deformities, no cyanosis or clubbing  Neuro: alert, non focal  Skin: Warm, no lesions or rashes       Assessment & Plan:  COPD (chronic obstructive pulmonary disease) (HCC) We will temporarily stop Bevespi Try starting Stiolto 2 puffs once daily.  Keep track of whether this medication helps you more.  If so let  us know and we will send a prescription to your pharmacy. Keep your levalbuterol (Xopenex) available to use 2 puffs up to every 4 hours if needed for shortness of breath, chest tightness, wheezing. Get the flu shot as planned You would benefit from getting the COVID-19 vaccine this fall You would benefit from getting the RSV vaccine this fall Follow-up with APP in 6 months or sooner if you have any problems Follow with Dr. Lamonte Sakai in 12 months or sooner if you have any problems.   TOBACCO USER Smoking.  Discussed cessation with him today.  He is not ready to commit to a quit date  Baltazar Apo, MD, PhD 11/25/2021, 4:24 PM [facility] [phone] or if no answer [phone]

## 2021-11-25 NOTE — Patient Instructions (Signed)
We will temporarily stop Bevespi Try starting Stiolto 2 puffs once daily.  Keep track of whether this medication helps you more.  If so let us know and we will send a prescription to your pharmacy. Keep your levalbuterol (Xopenex) available to use 2 puffs up to every 4 hours if needed for shortness of breath, chest tightness, wheezing. Get the flu shot as planned You would benefit from getting the COVID-19 vaccine this fall You would benefit from getting the RSV vaccine this fall Try to work on stopping your cigarettes altogether. Follow-up with APP in 6 months or sooner if you have any problems Follow with Dr. Lamonte Sakai in 12 months or sooner if you have any problems.

## 2021-11-26 ENCOUNTER — Telehealth: Payer: Self-pay | Admitting: Pharmacist

## 2021-11-26 NOTE — Chronic Care Management (AMB) (Unsigned)
Chronic Care Management Pharmacy Assistant   Name: Jason Fisher  MRN: [id number] DOB: 1940-04-19  Reason for Encounter: Medication Review Medication coordination   Recent office visits:  11/20/21 Everardo Beals, MD - Patient presented for Diabetes mellitus type 2 with peripheral vascular complications and other concerns. No medication changes.   Recent consult visits:  11/25/21 Collene Gobble, MD (Pulmonary Disease) - Patient presented for Chronic bronchitis unspecified chronic bronchitis type and other concerns. No medication changes.  11/20/21 Martinique, Peter M, MD (Cardiology) - Patient presented for Coronary artery disease of native artery of native heart with stable angina pectoris. Stopped Hydralazine due to lightheadedness.  Hospital visits:  None in previous 6 months  Medications: Outpatient Encounter Medications as of 11/26/2021  Medication Sig   ACCU-CHEK AVIVA PLUS test strip CHECK BLOOD SUGAR ONCE DAILY. DX E11.51   atorvastatin (LIPITOR) 40 MG tablet TAKE ONE TABLET BY MOUTH ONCE DAILY   Continuous Blood Gluc Receiver (DEXCOM G6 RECEIVER) DEVI CHECK VLOOD SUGAR AS DIRECTED AS DIRECTED   Continuous Blood Gluc Sensor (DEXCOM G6 SENSOR) MISC APPLY 1 SENSOR EVERY 10 DAYS   Continuous Blood Gluc Transmit (DEXCOM G6 TRANSMITTER) MISC APPLY TO SENSOR EVERY 90 DAYS   diltiazem (CARDIZEM CD) 120 MG 24 hr capsule TAKE ONE CAPSULE BY MOUTH ONCE DAILY   ELIQUIS 5 MG TABS tablet TAKE ONE TABLET BY MOUTH AT BREAKFAST AND AT BEDTIME   furosemide (LASIX) 80 MG tablet TAKE ONE TABLET BY MOUTH ONCE DAILY   Glycopyrrolate-Formoterol (BEVESPI AEROSPHERE) 9-4.8 MCG/ACT AERO TAKE 2 PUFFS BY MOUTH TWICE A DAY   isosorbide mononitrate (IMDUR) 30 MG 24 hr tablet Take 1 tablet (30 mg total) by mouth daily.   levalbuterol (XOPENEX HFA) 45 MCG/ACT inhaler INHALE 1 TO 2 PUFFS BY MOUTH EVERY 6 HOURS AS NEEDED FOR WHEEZE   metFORMIN (GLUCOPHAGE) 1000 MG tablet TAKE ONE TABLET BY MOUTH AT  BREAKFAST AND AT BEDTIME   metoprolol tartrate (LOPRESSOR) 100 MG tablet Take 1 tablet (100 mg total) by mouth 2 (two) times daily.   nitroGLYCERIN (NITROSTAT) 0.4 MG SL tablet Place 1 tablet (0.4 mg total) under the tongue every 5 (five) minutes as needed for chest pain. X 3 doses   olmesartan (BENICAR) 5 MG tablet Take 10 mg by mouth daily.   polyethylene glycol (MIRALAX / GLYCOLAX) packet Take 17 g by mouth daily as needed for mild constipation.   TRESIBA FLEXTOUCH 100 UNIT/ML FlexTouch Pen INJECT 32 UNITS into THE SKIN AT BEDTIME   No facility-administered encounter medications on file as of 11/26/2021.   Reviewed chart for medication changes ahead of medication coordination call.           BP Readings from Last 3 Encounters:  11/25/21 110/76  11/20/21 (!) 160/86  11/20/21 136/80    Lab Results  Component Value Date   HGBA1C 6.3 (A) 11/20/2021     Patient obtains medications through Adherence Packaging  30 Days   Last adherence delivery included:  Metformin (Glucophage)1000 mg : Take one tablet at Breakfast and at bedtime Hydralazine (Apresoline) 25 mg : Take one tablet at Breakfast  Eliquis 5 mg : Take one tablet at Breakfast and at Bedtime Furosemide  (Lasix) 80 mg : Take one tablet at Breakfast Metoprolol (Lopressor) 100 mg : Take one tablet at Breakfast and at Dinner Atorvastatin (Lipitor) 40 mg : Take one tablet at Breakfast Diltiazem (Cardizem) 120 mg : Take one tablet at Breakfast Olmesartan 5 mg: Take 2 tabs(10 mg)  daily at breakfast Isosorbide Mono 30 mg: Take one at breakfast (wife aware of addition) Tresiba sent to pt on 10/23/21 wife confirms receipt     Patient declined the following medications  Nitroglycerin 0.4 (Sublingual) ; 1 tab every 5 min as needed (abundance)     Confirmed delivery date of 11/10/21, advised patient that pharmacy will contact them the morning of delivery.    Patient is due for next adherence delivery on: 12/09/21. Called patient and  reviewed medications and coordinated delivery. Packs 30 DS  This delivery to include: Metformin (Glucophage)1000 mg : Take one tablet at Breakfast and at bedtime Eliquis 5 mg : Take one tablet at Breakfast and at Bedtime Furosemide  (Lasix) 80 mg : Take one tablet at Breakfast Metoprolol (Lopressor) 100 mg : Take one tablet at Breakfast and at Dinner Isosorbide Mono 30 mg: Take one at breakfast  Atorvastatin (Lipitor) 40 mg : Take one tablet at Breakfast Diltiazem (Cardizem) 120 mg : Take one tablet at Breakfast Olmesartan 5 mg: Take 2 tabs(10 mg) daily at breakfast   Tresiba sent to pt on 10/19   Patient will need a short fill of (med), prior to adherence delivery. (To align with sync date or if PRN med)  Coordinated acute fill for (med) to be delivered (date).  Patient declined the following medications (meds) due to (reason)  Patient needs refills for ***.  Confirmed delivery date of 12/09/21, advised patient that pharmacy will contact them the morning of delivery.   Care Gaps: Eye exam - Overdue Foot Exam - Overdue AWV- 01/2018 COVID Booster - Postponed Zoster Vaccine - Postponed Flu Vaccine - Postponed BP- 110/76 11/25/21 Lab Results  Component Value Date   HGBA1C 6.3 (A) 11/20/2021    Star Rating Drugs: Metformin (Glucophage) 1000 mg - Last filled 11/05/21 30 DS at [facility] Atorvastatin (Lipitor) 40 mg Last filled 11/05/21 30 DS at [facility] Pharmacist Assistant [phone]

## 2021-12-22 ENCOUNTER — Telehealth: Payer: Self-pay | Admitting: Internal Medicine

## 2021-12-22 NOTE — Telephone Encounter (Signed)
Patient's wife states that pulmonologist changed patient's inhaler.  She wants to talk to [facility] about patient assistance for the new inhaler.  Ok to leave a message and wife will call back.

## 2021-12-22 NOTE — Telephone Encounter (Signed)
Called patient's wife back and went over requirements for patient assistance for [employer]. Put application in the mail for them to sign and return to Dr. Agustina Caroli office. Patient's wife verbalized her understanding.  Patient's wife reports Jason Fisher is working much better [employer] and wants to continue using this.

## 2021-12-24 ENCOUNTER — Telehealth: Payer: Self-pay | Admitting: Pharmacist

## 2021-12-24 NOTE — Progress Notes (Signed)
Chronic Care Management Pharmacy Assistant   Name: Jason Fisher  MRN: [id number] DOB: October 15, 1940  Reason for Encounter: Medication Review/ Medication Coordination    Recent office visits:  None  Recent consult visits:  None  Hospital visits:  None in previous 6 months  Medications: Outpatient Encounter Medications as of 12/24/2021  Medication Sig   ACCU-CHEK AVIVA PLUS test strip CHECK BLOOD SUGAR ONCE DAILY. DX E11.51   atorvastatin (LIPITOR) 40 MG tablet TAKE ONE TABLET BY MOUTH ONCE DAILY   Continuous Blood Gluc Receiver (DEXCOM G6 RECEIVER) DEVI CHECK VLOOD SUGAR AS DIRECTED AS DIRECTED   Continuous Blood Gluc Sensor (DEXCOM G6 SENSOR) MISC APPLY 1 SENSOR EVERY 10 DAYS   Continuous Blood Gluc Transmit (DEXCOM G6 TRANSMITTER) MISC APPLY TO SENSOR EVERY 90 DAYS   diltiazem (CARDIZEM CD) 120 MG 24 hr capsule TAKE ONE CAPSULE BY MOUTH ONCE DAILY   ELIQUIS 5 MG TABS tablet TAKE ONE TABLET BY MOUTH AT BREAKFAST AND AT BEDTIME   furosemide (LASIX) 80 MG tablet TAKE ONE TABLET BY MOUTH ONCE DAILY   Glycopyrrolate-Formoterol (BEVESPI AEROSPHERE) 9-4.8 MCG/ACT AERO TAKE 2 PUFFS BY MOUTH TWICE A DAY   isosorbide mononitrate (IMDUR) 30 MG 24 hr tablet Take 1 tablet (30 mg total) by mouth daily.   levalbuterol (XOPENEX HFA) 45 MCG/ACT inhaler INHALE 1 TO 2 PUFFS BY MOUTH EVERY 6 HOURS AS NEEDED FOR WHEEZE   metFORMIN (GLUCOPHAGE) 1000 MG tablet TAKE ONE TABLET BY MOUTH AT BREAKFAST AND AT BEDTIME   metoprolol tartrate (LOPRESSOR) 100 MG tablet Take 1 tablet (100 mg total) by mouth 2 (two) times daily.   nitroGLYCERIN (NITROSTAT) 0.4 MG SL tablet Place 1 tablet (0.4 mg total) under the tongue every 5 (five) minutes as needed for chest pain. X 3 doses   olmesartan (BENICAR) 5 MG tablet Take 10 mg by mouth daily.   polyethylene glycol (MIRALAX / GLYCOLAX) packet Take 17 g by mouth daily as needed for mild constipation.   TRESIBA FLEXTOUCH 100 UNIT/ML FlexTouch Pen INJECT 32 UNITS into  THE SKIN AT BEDTIME   No facility-administered encounter medications on file as of 12/24/2021.  Reviewed chart for medication changes ahead of medication coordination call.   BP Readings from Last 3 Encounters:  11/25/21 110/76  11/20/21 (!) 160/86  11/20/21 136/80    Lab Results  Component Value Date   HGBA1C 6.3 (A) 11/20/2021     Patient obtains medications through Adherence Packaging  30 Days   Last adherence delivery included: Metformin (Glucophage)1000 mg : Take one tablet at Breakfast and at bedtime Eliquis 5 mg : Take one tablet at Breakfast and at Bedtime Furosemide  (Lasix) 80 mg : Take one tablet at Breakfast Metoprolol (Lopressor) 100 mg : Take one tablet at Breakfast and at Dinner Isosorbide Mono 30 mg: Take one at breakfast  Atorvastatin (Lipitor) 40 mg : Take one tablet at Breakfast Diltiazem (Cardizem) 120 mg : Take one tablet at Breakfast Olmesartan 5 mg: Take 2 tabs(10 mg) daily at breakfast     Tresiba sent to pt on 10/19       Unable to reach and confirm delivery date of 12/09/21, left voicemail for patient that pharmacy will contact them the morning of delivery.     Patient is due for next adherence delivery on: 01/07/22. Called patient and reviewed medications and coordinated delivery. Packs 30 DS  This delivery to include: Metformin (Glucophage)1000 mg : Take one tablet at Breakfast and at bedtime Eliquis 5 mg :  Take one tablet at Breakfast and at Bedtime Furosemide  (Lasix) 80 mg : Take one tablet at Breakfast Metoprolol (Lopressor) 100 mg : Take one tablet at Breakfast and at Dinner Isosorbide Mono 30 mg: Take one at breakfast  Atorvastatin (Lipitor) 40 mg : Take one tablet at Breakfast Diltiazem (Cardizem) 120 mg : Take one tablet at Breakfast Olmesartan 5 mg: Take 2 tabs(10 mg) daily at breakfast  Tresiba sent to pt on 11/20 wife confirms receipt   Confirmed delivery date of 01/07/22, advised patient that pharmacy will contact them the  morning of delivery.   Notes: Call to [facility] and me to cancel out Bevespi per [name] and added him to the spreadsheet for [facility] as she has mailed him an application  Care Gaps: Eye Exam - Overdue Foot Exam - Overdue AWV- 01/2018 MSG sent to [facility] to schedule. COVID Booster - Overdue Zoster Vaccine - Overdue BP- 110/76 11/25/21 Lab Results  Component Value Date   HGBA1C 6.3 (A) 11/20/2021    Star Rating Drugs: Metformin (Glucophage) 1000 mg - Last filled 12/05/21 30 DS at [facility] Atorvastatin (Lipitor) 40 mg Last filled 12/05/21 30 DS at [facility] Pharmacist Assistant [phone]

## 2022-01-01 ENCOUNTER — Telehealth: Payer: Self-pay | Admitting: Emergency Medicine

## 2022-01-01 ENCOUNTER — Telehealth: Payer: Self-pay | Admitting: Internal Medicine

## 2022-01-01 NOTE — Telephone Encounter (Signed)
Patient has been scheduled for AWV with Dr.Hernandez.      FYI  

## 2022-01-02 MED ORDER — STIOLTO RESPIMAT 2.5-2.5 MCG/ACT IN AERS: 2.0000 | INHALATION_SPRAY | Freq: Every day | RESPIRATORY_TRACT | 5 refills | Status: DC

## 2022-01-02 NOTE — Telephone Encounter (Signed)
Pt's spouse Wilhelmenia Blase called the office back. Verified preferred pharmacy and sent Rx for Stiolto in for pt. Nothing further needed.

## 2022-01-02 NOTE — Telephone Encounter (Signed)
Attempted to call pt but unable to reach. Left message to return call.  

## 2022-01-21 ENCOUNTER — Ambulatory Visit (INDEPENDENT_AMBULATORY_CARE_PROVIDER_SITE_OTHER): Payer: Medicare Other | Admitting: Internal Medicine

## 2022-01-21 ENCOUNTER — Encounter: Payer: Self-pay | Admitting: Internal Medicine

## 2022-01-21 VITALS — BP 118/80 | Temp 97.4°F | Ht 68.0 in | Wt 166.9 lb

## 2022-01-21 DIAGNOSIS — Z0000 Encounter for general adult medical examination without abnormal findings: Secondary | ICD-10-CM

## 2022-01-21 DIAGNOSIS — Z23 Encounter for immunization: Secondary | ICD-10-CM

## 2022-01-21 NOTE — Progress Notes (Signed)
Established Patient Office Visit     CC/Reason for Visit: Annual preventive exam and subsequent Medicare wellness visit  HPI: Jason Fisher is a 81 y.o. male who is coming in today for the above mentioned reasons. Past Medical History is significant for:  Hypertension, hyperlipidemia, AAA, COPD, A. fib, stage III chronic kidney disease, heart failure with preserved ejection fraction, type 2 diabetes, coronary artery disease.  He is overdue for eye and dental care, no hearing difficulty, he is very sedentary.  He is due for pneumonia, RSV, shingles vaccines.  He elects to defer cancer screening due to age.   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 2007 Inf STEMI s/p Vision BMS  2 to RCA;  b. 2009 ISR Prox RCA Stent-->with DES;  c. 09/2015 PCI: LM nl, LAD 30p/m, D1 90, LCX nl, OM1 small, 80, OM2 90 (2.5x14 Biofreedom stent), OM3 small, RCA 30p ISR, 2mISR, 419m85d, EF 50-55%.   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 On Eliquis (CHA2DS2VASc = 5).  Rate controlled w/ BB and CCB.   Chronic diastolic CHF (congestive heart failure) ([facility] [place]   a. 09/2015 Echo: EF 55-60%, mildly dil LA.   CKD (chronic kidney disease), stage III ([facility]   they mentioned this to the patient but they would work on it later after the aneurysm   COLONIC POLYPS, HX OF 11/02/2006   COPD (chronic obstructive pulmonary disease) ([facility]   DIABETES MELLITUS, TYPE II 10/29/2006   Diverticulosis    History of tobacco abuse    a. Quit 09/2015.   HYPERLIPIDEMIA 10/29/2006   Hypertension    Hypertensive heart disease with heart failure ([facility]   Pneumonia 09/2015   /nArchie Endo/29/2018   [facility]ST elevation myocardial infarction) [facility]   /nArchie Endo/29/2018    Past Surgical History:  Procedure Laterality Date   ABDOMINAL AORTIC  ENDOVASCULAR STENT GRAFT N/A 07/31/2016   Procedure: ABDOMINAL AORTIC ENDOVASCULAR STENT GRAFT;  Surgeon: BrSerafina MitchellMD;  [facility] Service: Vascular;  Laterality: N/A;   CARDIAC CATHETERIZATION N/A 09/26/2015   Procedure: Left Heart Cath and Coronary Angiography;  Surgeon: Peter M JoMartiniqueMD;  [facility];  Service: Cardiovascular;  Laterality: N/A;   CARDIAC CATHETERIZATION N/A 09/26/2015   Procedure: Coronary Stent Intervention;  Surgeon: Peter M JoMartiniqueMD;  [facility];  Service: Cardiovascular;  Laterality: N/A;   CATARACT EXTRACTION Bilateral    COLONOSCOPY     CORONARY ANGIOPLASTY WITH STENT PLACEMENT  2007   Inferior STEMI 2007 - RCA PCI with 2 Vision BMS; Abnormal Myoview in 2009 - pRCA ins-stent restenosis & unstented disease - DES PCI Promus 3.0 x 20 mm (3.5 mm)   ENDOVASCULAR REPAIR/STENT GRAFT  07/31/2016   INCISION AND DRAINAGE PERIRECTAL ABSCESS N/A 11/06/2015   Procedure: IRRIGATION AND DEBRIDEMENT PERIRECTAL ABSCESS, POSSIBLE HEMORRHOIDECTOMY;  Surgeon: DoCoralie KeensMD;  [place] [facility] Service: General;  Laterality: N/A;   ORIF SHOULDER FRACTURE Right    "fell out of back end of a truck"    Social History:  reports that he has been smoking cigarettes. He has a 49.00 pack-year smoking history. He has never used smokeless tobacco. He reports that he does not drink alcohol and does  not use drugs.  Allergies: Allergies  Allergen Reactions   Contrast Media [Iodinated Contrast Media] Anaphylaxis    Family History:  Family History  Problem Relation Age of Onset   Heart attack Mother    Diabetes Mother    Heart attack Father    Hypertension Father    Heart attack Brother    Diabetes Brother      Current Outpatient Medications:    ACCU-CHEK AVIVA PLUS test strip, CHECK BLOOD SUGAR ONCE DAILY. DX E11.51, Disp: 100 strip, Rfl: 2   atorvastatin (LIPITOR) 40 MG tablet, TAKE ONE TABLET BY MOUTH ONCE DAILY, Disp: 90  tablet, Rfl: 1   Continuous Blood Gluc Receiver (DEXCOM G6 RECEIVER) DEVI, CHECK VLOOD SUGAR AS DIRECTED AS DIRECTED, Disp: 1 each, Rfl: 0   Continuous Blood Gluc Sensor (DEXCOM G6 SENSOR) MISC, APPLY 1 SENSOR EVERY 10 DAYS, Disp: 3 each, Rfl: 2   Continuous Blood Gluc Transmit (DEXCOM G6 TRANSMITTER) MISC, APPLY TO SENSOR EVERY 90 DAYS, Disp: 1 each, Rfl: 0   diltiazem (CARDIZEM CD) 120 MG 24 hr capsule, TAKE ONE CAPSULE BY MOUTH ONCE DAILY, Disp: 90 capsule, Rfl: 1   ELIQUIS 5 MG TABS tablet, TAKE ONE TABLET BY MOUTH AT BREAKFAST AND AT BEDTIME, Disp: 180 tablet, Rfl: 1   furosemide (LASIX) 80 MG tablet, TAKE ONE TABLET BY MOUTH ONCE DAILY, Disp: 90 tablet, Rfl: 1   isosorbide mononitrate (IMDUR) 30 MG 24 hr tablet, Take 1 tablet (30 mg total) by mouth daily., Disp: 90 tablet, Rfl: 3   levalbuterol (XOPENEX HFA) 45 MCG/ACT inhaler, INHALE 1 TO 2 PUFFS BY MOUTH EVERY 6 HOURS AS NEEDED FOR WHEEZE, Disp: 15 each, Rfl: 3   metFORMIN (GLUCOPHAGE) 1000 MG tablet, TAKE ONE TABLET BY MOUTH AT BREAKFAST AND AT BEDTIME, Disp: 180 tablet, Rfl: 1   metoprolol tartrate (LOPRESSOR) 100 MG tablet, Take 1 tablet (100 mg total) by mouth 2 (two) times daily., Disp: 180 tablet, Rfl: 3   nitroGLYCERIN (NITROSTAT) 0.4 MG SL tablet, Place 1 tablet (0.4 mg total) under the tongue every 5 (five) minutes as needed for chest pain. X 3 doses, Disp: 25 tablet, Rfl: 4   olmesartan (BENICAR) 5 MG tablet, Take 10 mg by mouth daily., Disp: , Rfl:    polyethylene glycol (MIRALAX / GLYCOLAX) packet, Take 17 g by mouth daily as needed for mild constipation., Disp: , Rfl:    Tiotropium Bromide-Olodaterol (STIOLTO RESPIMAT) 2.5-2.5 MCG/ACT AERS, Inhale 2 puffs into the lungs daily., Disp: 4 g, Rfl: 5   TRESIBA FLEXTOUCH 100 UNIT/ML FlexTouch Pen, INJECT 32 UNITS into THE SKIN AT BEDTIME, Disp: 15 mL, Rfl: 2  Review of Systems:  Constitutional: Denies fever, chills, diaphoresis, appetite change and fatigue.  HEENT: Denies  photophobia, eye pain, redness, hearing loss, ear pain, congestion, sore throat, rhinorrhea, sneezing, mouth sores, trouble swallowing, neck pain, neck stiffness and tinnitus.   Respiratory: Denies  cough, chest tightness,  and wheezing.   Cardiovascular: Denies chest pain, palpitations and leg swelling.  Gastrointestinal: Denies nausea, vomiting, abdominal pain, diarrhea, constipation, blood in stool and abdominal distention.  Genitourinary: Denies dysuria, urgency, frequency, hematuria, flank pain and difficulty urinating.  Endocrine: Denies: hot or cold intolerance, sweats, changes in hair or nails, polyuria, polydipsia. Musculoskeletal: Denies myalgias, back pain, joint swelling, arthralgias and gait problem.  Skin: Denies pallor, rash and wound.  Neurological: Denies  seizures, syncope, weakness, numbness and headaches.  Hematological: Denies adenopathy. Easy bruising, personal or family bleeding history  Psychiatric/Behavioral: Denies suicidal ideation,  mood changes, confusion, nervousness, sleep disturbance and agitation    Physical Exam: Vitals:   01/21/22 1439  BP: 118/80  Temp: (!) 97.4 F (36.3 C)  TempSrc: Oral  Weight: 166 lb 14.4 oz (75.7 kg)  Height: '5\' 8"'$  (1.727 m)    Body mass index is 25.38 kg/m.   Constitutional: NAD, calm, comfortable Eyes: PERRL, lids and conjunctivae normal, wears corrective lenses ENMT: Mucous membranes are moist. Posterior pharynx clear of any exudate or lesions.  Poor dentition. Tympanic membrane is pearly white, no erythema or bulging. Neck: normal, supple, no masses, no thyromegaly Respiratory: clear to auscultation bilaterally, no wheezing, no crackles. Normal respiratory effort. No accessory muscle use.  Cardiovascular: Regular rate and rhythm, no murmurs / rubs / gallops. No extremity edema. 2+ pedal pulses. No carotid bruits.  Abdomen: no tenderness, no masses palpated. No hepatosplenomegaly. Bowel sounds positive.  Musculoskeletal:  no clubbing / cyanosis. No joint deformity upper and lower extremities. Good ROM, no contractures. Normal muscle tone.  Skin: no rashes, lesions, ulcers. No induration Neurologic: CN 2-12 grossly intact. Sensation intact, DTR normal. Strength 5/5 in all 4.  Psychiatric: Normal judgment and insight. Alert and oriented x 3. Normal mood.   Subsequent Medicare wellness visit   1. Risk factors, based on past  M,S,F -cardiovascular disease risk factors include age, gender, history of hypertension, hyperlipidemia, known coronary artery disease   2.  Physical activities: Sedentary   3.  Depression/mood: Stable, not depressed   4.  Hearing: No perceived issues   5.  ADL's: Independent in all ADLs   6.  Fall risk: Low fall risk   7.  Home safety: No problems identified   8.  Height weight, and visual acuity: height and weight as above, vision:  Vision Screening   Right eye Left eye Both eyes  Without correction     With correction '20/40 20/40 20/40 '$     9.  Counseling: Advised to update immunizations and be more physically active   10. Lab orders based on risk factors: Laboratory update will be reviewed   11. Referral : None today   12. Care plan: Follow-up with me in 4 months   13. Cognitive assessment: No cognitive impairment   14. Screening: Patient provided with a written and personalized 5-10 year screening schedule in the AVS. yes   15. Provider List Update: PCP, cardiology, pulmonology  16. Advance Directives: Full code   17. Opioids: Patient is not on any opioid prescriptions and has no risk factors for a substance use disorder.   [place] Office Visit from 01/21/2022 in [place] at [place]  PHQ-9 Total Score 0          02/10/2021    1:24 PM 08/20/2021    1:22 PM 11/20/2021   11:08 AM 11/20/2021   11:14 AM 01/21/2022    4:01 PM  Fall Risk  Falls in the past year? 0 0 0 0 0  Was there an injury with Fall? 0 0 0 0 0  Fall Risk Category  Calculator 0 0 0 0 0  Fall Risk Category Low Low Low Low Low  Patient Fall Risk Level Low fall risk Low fall risk Low fall risk Low fall risk Low fall risk  Patient at Risk for Falls Due to No Fall Risks No Fall Risks No Fall Risks No Fall Risks No Fall Risks  Fall risk Follow up Falls evaluation completed Falls evaluation completed Falls evaluation completed Falls evaluation completed Falls evaluation completed  Impression and Plan:  Encounter for preventive health examination  Need for vaccination for Strep pneumoniae - Plan: Pneumococcal conjugate vaccine 20-valent (Prevnar 20)  -Recommend routine eye and dental care. -Immunizations: PCV 20 in office today and RSV and shingles at pharmacy -Healthy lifestyle discussed in detail. -Labs to be updated today. -Colon cancer screening: 04/2015, no further due to age and comorbidities -Breast cancer screening: Not applicable -Cervical cancer screening: Not applicable -Lung cancer screening: Not applicable -Prostate cancer screening: Declines due to age -DEXA: Not applicable     Kelilah Hebard [name], MD [facility]

## 2022-01-23 ENCOUNTER — Telehealth: Payer: Self-pay | Admitting: Pharmacist

## 2022-01-23 NOTE — Chronic Care Management (AMB) (Unsigned)
Chronic Care Management Pharmacy Assistant   Name: Jason Fisher  MRN: [id number] DOB: 07/26/1940  Reason for Encounter: Medication Review Medication Coordination   Recent office visits:  01/21/22 [name], [name], MD - Patient presented for Preventative health exam and other concerns. No medication changes.  Recent consult visits:  None  Hospital visits:  None in previous 6 months  Medications: Outpatient Encounter Medications as of 01/23/2022  Medication Sig   ACCU-CHEK AVIVA PLUS test strip CHECK BLOOD SUGAR ONCE DAILY. DX E11.51   atorvastatin (LIPITOR) 40 MG tablet TAKE ONE TABLET BY MOUTH ONCE DAILY   Continuous Blood Gluc Receiver (DEXCOM G6 RECEIVER) DEVI CHECK VLOOD SUGAR AS DIRECTED AS DIRECTED   Continuous Blood Gluc Sensor (DEXCOM G6 SENSOR) MISC APPLY 1 SENSOR EVERY 10 DAYS   Continuous Blood Gluc Transmit (DEXCOM G6 TRANSMITTER) MISC APPLY TO SENSOR EVERY 90 DAYS   diltiazem (CARDIZEM CD) 120 MG 24 hr capsule TAKE ONE CAPSULE BY MOUTH ONCE DAILY   ELIQUIS 5 MG TABS tablet TAKE ONE TABLET BY MOUTH AT BREAKFAST AND AT BEDTIME   furosemide (LASIX) 80 MG tablet TAKE ONE TABLET BY MOUTH ONCE DAILY   isosorbide mononitrate (IMDUR) 30 MG 24 hr tablet Take 1 tablet (30 mg total) by mouth daily.   levalbuterol (XOPENEX HFA) 45 MCG/ACT inhaler INHALE 1 TO 2 PUFFS BY MOUTH EVERY 6 HOURS AS NEEDED FOR WHEEZE   metFORMIN (GLUCOPHAGE) 1000 MG tablet TAKE ONE TABLET BY MOUTH AT BREAKFAST AND AT BEDTIME   metoprolol tartrate (LOPRESSOR) 100 MG tablet Take 1 tablet (100 mg total) by mouth 2 (two) times daily.   nitroGLYCERIN (NITROSTAT) 0.4 MG SL tablet Place 1 tablet (0.4 mg total) under the tongue every 5 (five) minutes as needed for chest pain. X 3 doses   olmesartan (BENICAR) 5 MG tablet Take 10 mg by mouth daily.   polyethylene glycol (MIRALAX / GLYCOLAX) packet Take 17 g by mouth daily as needed for mild constipation.   Tiotropium Bromide-Olodaterol (STIOLTO  RESPIMAT) 2.5-2.5 MCG/ACT AERS Inhale 2 puffs into the lungs daily.   TRESIBA FLEXTOUCH 100 UNIT/ML FlexTouch Pen INJECT 32 UNITS into THE SKIN AT BEDTIME   No facility-administered encounter medications on file as of 01/23/2022.   Reviewed chart for medication changes ahead of medication coordination call.  BP Readings from Last 3 Encounters:  01/21/22 118/80  11/25/21 110/76  11/20/21 (!) 160/86    Lab Results  Component Value Date   HGBA1C 6.3 (A) 11/20/2021     Patient obtains medications through Adherence Packaging  30 Days   Last adherence delivery included:  Metformin (Glucophage)1000 mg : Take one tablet at Breakfast and at bedtime Eliquis 5 mg : Take one tablet at Breakfast and at Bedtime Furosemide  (Lasix) 80 mg : Take one tablet at Breakfast Metoprolol (Lopressor) 100 mg : Take one tablet at Breakfast and at Dinner Isosorbide Mono 30 mg: Take one at breakfast  Atorvastatin (Lipitor) 40 mg : Take one tablet at Breakfast Diltiazem (Cardizem) 120 mg : Take one tablet at Breakfast Olmesartan 5 mg: Take 2 tabs(10 mg) daily at breakfast   Tresiba sent to pt on 11/20 wife confirms receipt     Confirmed delivery date of 01/07/22, advised patient that pharmacy will contact them the morning of delivery.     Patient is due for next adherence delivery on: 02/06/22. Called patient and reviewed medications and coordinated delivery. Packs 30 DS  This delivery to include: Metformin (Glucophage)1000 mg :  Take one tablet at Breakfast and at bedtime Eliquis 5 mg : Take one tablet at Breakfast and at Bedtime Furosemide  (Lasix) 80 mg : Take one tablet at Breakfast Metoprolol (Lopressor) 100 mg : Take one tablet at Breakfast and at Dinner Isosorbide Mono 30 mg: Take one at breakfast  Atorvastatin (Lipitor) 40 mg : Take one tablet at Breakfast Diltiazem (Cardizem) 120 mg : Take one tablet at Breakfast Olmesartan 5 mg: Take 2 tabs(10 mg) daily at breakfast   Tresiba sent to pt on  12/19 wife confirms receipt  [place] and me - stiolto - [place]rum   Patient will need a short fill of (med), prior to adherence delivery. (To align with sync date or if PRN med)  Coordinated acute fill for (med) to be delivered (date).  Patient declined the following medications (meds) due to (reason)  Patient needs refills for ***.  Confirmed delivery date of 02/06/22, advised patient that pharmacy will contact them the morning of delivery.   Care Gaps: TDAP - Overdue Eye Exam - Overdue Foot Exam - Overdue AWV- 01/2018 MSG sent to [facility] to schedule.  Lab Results  Component Value Date   HGBA1C 6.3 (A) 11/20/2021    Star Rating Drugs: Metformin (Glucophage) 1000 mg - Last filled 01/05/22 30 DS at [facility] Atorvastatin (Lipitor) 40 mg Last filled 01/05/22 30 DS at [facility] Pharmacist Assistant [phone]

## 2022-01-25 ENCOUNTER — Other Ambulatory Visit: Payer: Self-pay | Admitting: Cardiology

## 2022-01-25 DIAGNOSIS — I480 Paroxysmal atrial fibrillation: Secondary | ICD-10-CM

## 2022-01-25 DIAGNOSIS — I10 Essential (primary) hypertension: Secondary | ICD-10-CM

## 2022-01-27 NOTE — Telephone Encounter (Signed)
Per Calla Kicks, PharmD, dose should be decreased to 2.'5mg'$  BID.  Called pt to make him aware of dose change. Appropriate dose and refill sent to requested pharmacy.

## 2022-01-27 NOTE — Telephone Encounter (Signed)
Prescription refill request for Eliquis received. Indication: Afib  Last office visit: 11/20/21 ([place])  Scr: 1.69 (10/13/21)  Age: 81 Weight: 75.7kg  Per dosing criteria, pt's dose should be decreased to 2.'5mg'$ . Message sent to pharmacist to review.

## 2022-02-25 ENCOUNTER — Telehealth: Payer: Self-pay | Admitting: Pharmacist

## 2022-02-25 NOTE — Progress Notes (Signed)
Patient ID: Jason Fisher, male   DOB: 05-Jan-1941, 82 y.o.   MRN: [id number] Care Management & Coordination Services Pharmacy Team  Reason for Encounter: Medication coordination and delivery  Contacted patient to discuss medications and coordinate delivery from [facility]. Spoke with patient on 02/25/2022  Cycle dispensing form sent to [facility] for review.   Last adherence delivery date:02/06/22      Patient is due for next adherence delivery on: 03/09/22  Spoke to Patient's wife to coordinate medication Delivery  This delivery to include: Adherence Packaging  30 Days  Metformin (Glucophage)1000 mg : Take one tablet at Breakfast and at bedtime Eliquis 2.5 mg : Take one tablet at Breakfast and at Bedtime Furosemide  (Lasix) 80 mg : Take one tablet at Breakfast Metoprolol (Lopressor) 100 mg : Take one tablet at Breakfast and at Dinner Isosorbide Mono 30 mg: Take one at breakfast  Atorvastatin (Lipitor) 40 mg : Take one tablet at Breakfast Diltiazem (Cardizem) 120 mg : Take one tablet at Breakfast Olmesartan 5 mg: Take 2 tabs(10 mg) daily at breakfast  [place]    Patient asked for the following medications this month prior to delivery: [place] wife reports he has 2 days left of this medication & Nitroglycerin Acute form for delivery date of 02/27/22 Pharmacy advised  No refill request needed.  Confirmed delivery date of 03/09/22, advised patient that pharmacy will contact them the morning of delivery.   Any concerns about your medications? No  How often do you forget or accidentally miss a dose? Never  Do you use a pillbox? No  Is patient in packaging Yes  No concerns wife coordinates medication administration for patient.     Recent blood pressure readings are as follows: BP Readings from Last 3 Encounters:  01/21/22 118/80  11/25/21 110/76  11/20/21 (!) 160/86    Lab Results  Component Value Date   HGBA1C 6.3 (A) 11/20/2021     Chart review: Recent  office visits:  None  Recent consult visits:  None  Hospital visits:  None in previous 6 months  Medications: Outpatient Encounter Medications as of 02/25/2022  Medication Sig   ACCU-CHEK AVIVA PLUS test strip CHECK BLOOD SUGAR ONCE DAILY. DX E11.51   apixaban (ELIQUIS) 2.5 MG TABS tablet Take 1 tablet (2.5 mg total) by mouth 2 (two) times daily.   atorvastatin (LIPITOR) 40 MG tablet TAKE ONE TABLET BY MOUTH ONCE DAILY   Continuous Blood Gluc Receiver (DEXCOM G6 RECEIVER) DEVI CHECK VLOOD SUGAR AS DIRECTED AS DIRECTED   Continuous Blood Gluc Sensor (DEXCOM G6 SENSOR) MISC APPLY 1 SENSOR EVERY 10 DAYS   Continuous Blood Gluc Transmit (DEXCOM G6 TRANSMITTER) MISC APPLY TO SENSOR EVERY 90 DAYS   diltiazem (CARDIZEM CD) 120 MG 24 hr capsule TAKE ONE CAPSULE BY MOUTH ONCE DAILY   furosemide (LASIX) 80 MG tablet TAKE ONE TABLET BY MOUTH ONCE DAILY   isosorbide mononitrate (IMDUR) 30 MG 24 hr tablet Take 1 tablet (30 mg total) by mouth daily.   levalbuterol (XOPENEX HFA) 45 MCG/ACT inhaler INHALE 1 TO 2 PUFFS BY MOUTH EVERY 6 HOURS AS NEEDED FOR WHEEZE   metFORMIN (GLUCOPHAGE) 1000 MG tablet TAKE ONE TABLET BY MOUTH AT BREAKFAST AND AT BEDTIME   metoprolol tartrate (LOPRESSOR) 100 MG tablet TAKE ONE TABLET BY MOUTH AT BREAKFAST AND AT BEDTIME   nitroGLYCERIN (NITROSTAT) 0.4 MG SL tablet Place 1 tablet (0.4 mg total) under the tongue every 5 (five) minutes as needed for chest pain. X 3 doses  olmesartan (BENICAR) 5 MG tablet Take 10 mg by mouth daily.   polyethylene glycol (MIRALAX / GLYCOLAX) packet Take 17 g by mouth daily as needed for mild constipation.   Tiotropium Bromide-Olodaterol (STIOLTO RESPIMAT) 2.5-2.5 MCG/ACT AERS Inhale 2 puffs into the lungs daily.   TRESIBA FLEXTOUCH 100 UNIT/ML FlexTouch Pen INJECT 32 UNITS into THE SKIN AT BEDTIME   No facility-administered encounter medications on file as of 02/25/2022.   BP Readings from Last 3 Encounters:  01/21/22 118/80  11/25/21  110/76  11/20/21 (!) 160/86    Pulse Readings from Last 3 Encounters:  11/25/21 61  11/20/21 76  11/20/21 80    Lab Results  Component Value Date/Time   HGBA1C 6.3 (A) 11/20/2021 11:11 AM   HGBA1C 5.9 (A) 08/20/2021 01:19 PM   HGBA1C 6.7 (H) 02/10/2021 02:04 PM   HGBA1C 7.1 (H) 08/02/2020 10:33 AM   Lab Results  Component Value Date   CREATININE 1.69 (H) 10/13/2021   BUN 22 10/13/2021   GFR 46.36 (L) 02/10/2021   GFRNONAA 59 (L) 05/12/2020   GFRAA 56 (L) 03/06/2019   NA 146 (H) 10/13/2021   K 4.3 10/13/2021   CALCIUM 9.5 10/13/2021   CO2 28 10/13/2021        [place] Clinical Pharmacist Assistant [phone]

## 2022-03-19 ENCOUNTER — Other Ambulatory Visit: Payer: Self-pay | Admitting: *Deleted

## 2022-03-19 DIAGNOSIS — Z9889 Other specified postprocedural states: Secondary | ICD-10-CM

## 2022-03-26 ENCOUNTER — Other Ambulatory Visit: Payer: Self-pay | Admitting: Internal Medicine

## 2022-03-26 DIAGNOSIS — I5033 Acute on chronic diastolic (congestive) heart failure: Secondary | ICD-10-CM

## 2022-03-26 DIAGNOSIS — E1151 Type 2 diabetes mellitus with diabetic peripheral angiopathy without gangrene: Secondary | ICD-10-CM

## 2022-03-27 ENCOUNTER — Telehealth: Payer: Self-pay

## 2022-03-27 NOTE — Progress Notes (Unsigned)
Patient ID: Jason Fisher, male   DOB: 1940-06-12, 82 y.o.   MRN: [id number]  Care Management & Coordination Services Pharmacy Team  Reason for Encounter: Medication coordination and delivery  Contacted patient to discuss medications and coordinate delivery from [facility]. {US HC Outreach:28874} Cycle dispensing form sent to *** for review.   Last adherence delivery date: 03/09/22      Patient is due for next adherence delivery on: 04/08/22   This delivery to include: Adherence Packaging  30 Days  Metformin (Glucophage)1000 mg : Take one tablet at Breakfast and at bedtime Eliquis 2.5 mg : Take one tablet at Breakfast and at Bedtime Furosemide  (Lasix) 80 mg : Take one tablet at Breakfast Metoprolol (Lopressor) 100 mg : Take one tablet at Breakfast and at Dinner Isosorbide Mono 30 mg: Take one at breakfast  Atorvastatin (Lipitor) 40 mg : Take one tablet at Breakfast Diltiazem (Cardizem) 120 mg : Take one tablet at Breakfast Olmesartan 5 mg: Take 2 tabs(10 mg) daily at breakfast  Stiolto Appt in may/June angela  [name] was sent to patient on 03/23/22 and confirms receipt   Patient declined the following medications this month: ***  {refills needed:25320}  {Delivery UJ:6107908   Any concerns about your medications? {yes/no:20286}  How often do you forget or accidentally miss a dose? {Missed doses:25554}  Do you use a pillbox? No  Is patient in packaging Yes    Recent blood pressure readings are as follows:***  Recent blood glucose readings are as follows:***   Chart review: Recent office visits:  None  Recent consult visits:  None   Hospital visits:  None in previous 6 months  Medications: Outpatient Encounter Medications as of 03/27/2022  Medication Sig   ACCU-CHEK AVIVA PLUS test strip CHECK BLOOD SUGAR ONCE DAILY. DX E11.51   apixaban (ELIQUIS) 2.5 MG TABS tablet Take 1 tablet (2.5 mg total) by mouth 2 (two) times daily.   atorvastatin (LIPITOR)  40 MG tablet TAKE ONE TABLET BY MOUTH ONCE DAILY   Continuous Blood Gluc Receiver (DEXCOM G6 RECEIVER) DEVI CHECK VLOOD SUGAR AS DIRECTED AS DIRECTED   Continuous Blood Gluc Sensor (DEXCOM G6 SENSOR) MISC APPLY 1 SENSOR EVERY 10 DAYS   Continuous Blood Gluc Transmit (DEXCOM G6 TRANSMITTER) MISC APPLY TO SENSOR EVERY 90 DAYS   diltiazem (CARDIZEM CD) 120 MG 24 hr capsule TAKE ONE CAPSULE BY MOUTH ONCE DAILY   furosemide (LASIX) 80 MG tablet TAKE ONE TABLET BY MOUTH ONCE DAILY   isosorbide mononitrate (IMDUR) 30 MG 24 hr tablet Take 1 tablet (30 mg total) by mouth daily.   levalbuterol (XOPENEX HFA) 45 MCG/ACT inhaler INHALE 1 TO 2 PUFFS BY MOUTH EVERY 6 HOURS AS NEEDED FOR WHEEZE   metFORMIN (GLUCOPHAGE) 1000 MG tablet TAKE ONE TABLET BY MOUTH AT BREAKFAST AND AT BEDTIME   metoprolol tartrate (LOPRESSOR) 100 MG tablet TAKE ONE TABLET BY MOUTH AT BREAKFAST AND AT BEDTIME   nitroGLYCERIN (NITROSTAT) 0.4 MG SL tablet Place 1 tablet (0.4 mg total) under the tongue every 5 (five) minutes as needed for chest pain. X 3 doses   olmesartan (BENICAR) 5 MG tablet Take 10 mg by mouth daily.   polyethylene glycol (MIRALAX / GLYCOLAX) packet Take 17 g by mouth daily as needed for mild constipation.   Tiotropium Bromide-Olodaterol (STIOLTO RESPIMAT) 2.5-2.5 MCG/ACT AERS Inhale 2 puffs into the lungs daily.   TRESIBA FLEXTOUCH 100 UNIT/ML FlexTouch Pen INJECT 32 UNITS into THE SKIN AT BEDTIME   No facility-administered encounter medications  on file as of 03/27/2022.   BP Readings from Last 3 Encounters:  01/21/22 118/80  11/25/21 110/76  11/20/21 (!) 160/86    Pulse Readings from Last 3 Encounters:  11/25/21 61  11/20/21 76  11/20/21 80    Lab Results  Component Value Date/Time   HGBA1C 6.3 (A) 11/20/2021 11:11 AM   HGBA1C 5.9 (A) 08/20/2021 01:19 PM   HGBA1C 6.7 (H) 02/10/2021 02:04 PM   HGBA1C 7.1 (H) 08/02/2020 10:33 AM   Lab Results  Component Value Date   CREATININE 1.69 (H) 10/13/2021   BUN  22 10/13/2021   GFR 46.36 (L) 02/10/2021   GFRNONAA 59 (L) 05/12/2020   GFRAA 56 (L) 03/06/2019   NA 146 (H) 10/13/2021   K 4.3 10/13/2021   CALCIUM 9.5 10/13/2021   CO2 28 10/13/2021     [place] Clinical Pharmacist Assistant [phone]

## 2022-04-06 NOTE — Progress Notes (Unsigned)
HISTORY AND PHYSICAL     CC:  follow up. For EVAR Requesting Provider:  [name], [place]*  HPI: This is a 82 y.o. male who is here today for follow up for AAA and is s/p EVAR on  07/31/2016 by Dr. 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  He has history of CT postoperatively that had showed Type II endoleak, but this was resolved on subsequent imaging follow up.   Pt was last seen 07/29/2020 and at that time, he was having some mild periodic abdominal pain usually when he needed to have a BM or indigestion related and this resolved with Tums.  He was not having any back pain, claudication or rest pain.  He was smoking occasionally.  The AAA sac measured 3.98cm, which was decreased from previous 4.37cm 6 months prior.  He was scheduled for one year follow up.  The pt returns today for follow up studies.   The pt is on a statin for cholesterol management.    The pt is not on an aspirin.    Other AC:  Eliquis The pt is on CCB, BB, ARB for hypertension.  The pt does  have diabetes. Tobacco hx:  ***  Pt does *** have family hx of AAA.  Past Medical History:  Diagnosis Date   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 2007 Inf STEMI s/p Vision BMS  2 to RCA;  b. 2009 ISR Prox RCA Stent-->with DES;  c. 09/2015 PCI: LM nl, LAD 30p/m, D1 90, LCX nl, OM1 small, 80, OM2 90 (2.5x14 Biofreedom stent), OM3 small, RCA 30p ISR, 65mISR, 485m85d, EF 50-55%.   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 On Eliquis (CHA2DS2VASc = 5).  Rate controlled w/ BB and CCB.   Chronic diastolic CHF (congestive heart failure) ([facility]   a. 09/2015 Echo: EF 55-60%, mildly dil LA.   CKD (chronic kidney disease), stage III ([facility]   they mentioned this to the patient but they would work on it later after the aneurysm   COLONIC POLYPS, HX OF 11/02/2006   COPD (chronic obstructive pulmonary disease) ([facility]    DIABETES MELLITUS, TYPE II 10/29/2006   Diverticulosis    History of tobacco abuse    a. Quit 09/2015.   HYPERLIPIDEMIA 10/29/2006   Hypertension    Hypertensive heart disease with heart failure ([facility]   Pneumonia 09/2015   /nArchie Endo/29/2018   [facility] elevation [facility]) [facility]   /nArchie Endo/29/2018    Past Surgical History:  Procedure Laterality Date   ABDOMINAL AORTIC ENDOVASCULAR STENT GRAFT N/A 07/31/2016   Procedure: ABDOMINAL AORTIC ENDOVASCULAR STENT GRAFT;  Surgeon: BrSerafina MitchellMD;  [facility] Service: [facility];  Laterality: N/A;   CARDIAC CATHETERIZATION N/A 09/26/2015   Procedure: Left Heart Cath and Coronary Angiography;  Surgeon: Peter M JoMartiniqueMD;  [facility];  Service: Cardiovascular;  Laterality: N/A;   CARDIAC CATHETERIZATION N/A 09/26/2015   Procedure: Coronary Stent Intervention;  Surgeon: Peter M JoMartiniqueMD;  [facility];  Service: Cardiovascular;  Laterality: N/A;   CATARACT EXTRACTION Bilateral    COLONOSCOPY     CORONARY ANGIOPLASTY WITH STENT PLACEMENT  2007   Inferior STEMI 2007 - RCA PCI with 2 Vision BMS; Abnormal Myoview in 2009 - pRCA ins-stent restenosis & unstented disease -  DES PCI Promus 3.0 x 20 mm (3.5 mm)   ENDOVASCULAR REPAIR/STENT GRAFT  07/31/2016   INCISION AND DRAINAGE PERIRECTAL ABSCESS N/A 11/06/2015   Procedure: IRRIGATION AND DEBRIDEMENT PERIRECTAL ABSCESS, POSSIBLE HEMORRHOIDECTOMY;  Surgeon: Coralie Keens, MD;  Location: [facility];  Service: General;  Laterality: N/A;   ORIF SHOULDER FRACTURE Right    "fell out of back end of a truck"    Allergies  Allergen Reactions   Contrast Media [Iodinated Contrast Media] Anaphylaxis    Current Outpatient Medications  Medication Sig Dispense Refill   ACCU-CHEK AVIVA PLUS test strip CHECK BLOOD SUGAR ONCE DAILY. DX E11.51 100 strip 2   apixaban (ELIQUIS) 2.5 MG TABS tablet Take 1 tablet (2.5 mg total) by mouth 2 (two) times daily. 180  tablet 0   atorvastatin (LIPITOR) 40 MG tablet TAKE ONE TABLET BY MOUTH ONCE DAILY 90 tablet 1   Continuous Blood Gluc Receiver (DEXCOM G6 RECEIVER) DEVI CHECK VLOOD SUGAR AS DIRECTED AS DIRECTED 1 each 0   Continuous Blood Gluc Sensor (DEXCOM G6 SENSOR) MISC APPLY 1 SENSOR EVERY 10 DAYS 3 each 2   Continuous Blood Gluc Transmit (DEXCOM G6 TRANSMITTER) MISC APPLY TO SENSOR EVERY 90 DAYS 1 each 0   diltiazem (CARDIZEM CD) 120 MG 24 hr capsule TAKE ONE CAPSULE BY MOUTH ONCE DAILY 90 capsule 1   furosemide (LASIX) 80 MG tablet TAKE ONE TABLET BY MOUTH ONCE DAILY 90 tablet 1   isosorbide mononitrate (IMDUR) 30 MG 24 hr tablet Take 1 tablet (30 mg total) by mouth daily. 90 tablet 3   levalbuterol (XOPENEX HFA) 45 MCG/ACT inhaler INHALE 1 TO 2 PUFFS BY MOUTH EVERY 6 HOURS AS NEEDED FOR WHEEZE 15 each 3   metFORMIN (GLUCOPHAGE) 1000 MG tablet TAKE ONE TABLET BY MOUTH AT BREAKFAST AND AT BEDTIME 180 tablet 1   metoprolol tartrate (LOPRESSOR) 100 MG tablet TAKE ONE TABLET BY MOUTH AT BREAKFAST AND AT BEDTIME 180 tablet 3   nitroGLYCERIN (NITROSTAT) 0.4 MG SL tablet Place 1 tablet (0.4 mg total) under the tongue every 5 (five) minutes as needed for chest pain. X 3 doses 25 tablet 4   olmesartan (BENICAR) 5 MG tablet Take 10 mg by mouth daily.     polyethylene glycol (MIRALAX / GLYCOLAX) packet Take 17 g by mouth daily as needed for mild constipation.     Tiotropium Bromide-Olodaterol (STIOLTO RESPIMAT) 2.5-2.5 MCG/ACT AERS Inhale 2 puffs into the lungs daily. 4 g 5   TRESIBA FLEXTOUCH 100 UNIT/ML FlexTouch Pen INJECT 32 UNITS into THE SKIN AT BEDTIME 15 mL 2   No current facility-administered medications for this visit.    Family History  Problem Relation Age of Onset   Heart attack Mother    Diabetes Mother    Heart attack Father    Hypertension Father    Heart attack Brother    Diabetes Brother     Social History   Socioeconomic History   Marital status: Married    Spouse name: Not on file    Number of children: 5   Years of education: Not on file   Highest education level: Not on file  Occupational History   Occupation: [employer]  Tobacco Use   Smoking status: Some Days    Packs/day: 1.00    Years: 49.00    Total pack years: 49.00    Types: Cigarettes    Last attempt to quit: 09/08/2015    Years since quitting: 6.5   Smokeless tobacco: Never   Tobacco  comments:    passive smoker as well    11/20/2021 patient states he smokes a couple cigarettes a day  Vaping Use   Vaping Use: Never used  Substance and Sexual Activity   Alcohol use: No    Alcohol/week: 0.0 standard drinks of alcohol   Drug use: No   Sexual activity: Not on file  Other Topics Concern   Not on file  Social History Narrative   Not on file   Social Determinants of Health   Financial Resource Strain: Medium Risk (09/23/2020)   Overall Financial Resource Strain (CARDIA)    Difficulty of Paying Living Expenses: Somewhat hard  Food Insecurity: No Food Insecurity (11/19/2021)   Hunger Vital Sign    Worried About Running Out of Food in the Last Year: Never true    Ran Out of Food in the Last Year: Never true  Transportation Needs: No Transportation Needs (11/19/2021)   PRAPARE - Hydrologist (Medical): No    Lack of Transportation (Non-Medical): No  Physical Activity: Sufficiently Active (06/08/2019)   Exercise Vital Sign    Days of Exercise per Week: 3 days    Minutes of Exercise per Session: 60 min  Stress: Not on file  Social Connections: Not on file  Intimate Partner Violence: Not on file     REVIEW OF SYSTEMS:  *** '[X]'$  denotes positive finding, '[ ]'$  denotes negative finding Cardiac  Comments:  Chest pain or chest pressure:    Shortness of breath upon exertion:    Short of breath when lying flat:    Irregular heart rhythm:        Vascular    Pain in calf, thigh, or hip brought on by ambulation:    Pain in feet at night that wakes you up from your sleep:      Blood clot in your veins:    Leg swelling:         Pulmonary    Oxygen at home:    Productive cough:     Wheezing:         Neurologic    Sudden weakness in arms or legs:     Sudden numbness in arms or legs:     Sudden onset of difficulty speaking or slurred speech:    Temporary loss of vision in one eye:     Problems with dizziness:         Gastrointestinal    Blood in stool:     Vomited blood:         Genitourinary    Burning when urinating:     Blood in urine:        Psychiatric    Major depression:         Hematologic    Bleeding problems:    Problems with blood clotting too easily:        Skin    Rashes or ulcers:        Constitutional    Fever or chills:      PHYSICAL EXAMINATION:  ***  General:  WDWN in NAD; vital signs documented above Gait: Not observed HENT: WNL, normocephalic Pulmonary: normal non-labored breathing  Cardiac: {Desc; regular/irreg:14544} HR;  {With/Without:20273} carotid bruit*** Abdomen: soft, NT; aortic pulse is *** palpable Skin: {With/Without:20273} rashes Vascular Exam/Pulses:  Right Left  Radial {Exam; arterial pulse strength 0-4:30167} {Exam; arterial pulse strength 0-4:30167}  Femoral {Exam; arterial pulse strength 0-4:30167} {Exam; arterial pulse strength 0-4:30167}  Popliteal {Exam; arterial pulse  strength 0-4:30167} {Exam; arterial pulse strength 0-4:30167}  DP {Exam; arterial pulse strength 0-4:30167} {Exam; arterial pulse strength 0-4:30167}  PT {Exam; arterial pulse strength 0-4:30167} {Exam; arterial pulse strength 0-4:30167}   Extremities: {With/Without:20273} open wounds Musculoskeletal: no muscle wasting or atrophy  Neurologic: A&O X 3 Psychiatric:  The pt has {Desc; normal/abnormal:11317::"Normal"} affect.   Non-Invasive Vascular Imaging:   EVAR Arterial duplex on 04/07/2022: ***  Previous EVAR arterial duplex on 07/29/2020: Endovascular Aortic Repair (EVAR):   +----------+----------------+-------------------+-------------------+           Diameter AP (cm)Diameter Trans (cm)Velocities (cm/sec)  +----------+----------------+-------------------+-------------------+  Aorta    3.81            3.98               53                   +----------+----------------+-------------------+-------------------+  Right Limb1.39            1.46               57                   +----------+----------------+-------------------+-------------------+  Left Limb 1.23            1.32               70                   +----------+----------------+-------------------+-------------------+   Summary:  Abdominal Aorta: Patent endovascular aneurysm repair with no evidence of endoleak.    ASSESSMENT/PLAN:: 82 y.o. male here with hx of AAA and is s/p EVAR on  07/31/2016 by Dr. 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   -*** -continue *** -pt will f/u in *** with ***.   Leontine Locket, [facility] [phone]  Clinic MD:   Carlis Abbott

## 2022-04-07 ENCOUNTER — Ambulatory Visit (HOSPITAL_COMMUNITY)
Admission: RE | Admit: 2022-04-07 | Discharge: 2022-04-07 | Disposition: A | Discharge status: Home / Self Care | Payer: Medicare Other | Source: Ambulatory Visit | Attending: Vascular Surgery | Admitting: Vascular Surgery

## 2022-04-07 ENCOUNTER — Encounter: Payer: Self-pay | Admitting: Physician Assistant

## 2022-04-07 ENCOUNTER — Ambulatory Visit: Payer: Medicare Other | Admitting: Physician Assistant

## 2022-04-07 VITALS — BP 150/85 | HR 66 | Temp 98.0°F | Resp 20 | Ht 68.0 in | Wt 166.0 lb

## 2022-04-07 DIAGNOSIS — F17200 Nicotine dependence, unspecified, uncomplicated: Secondary | ICD-10-CM | POA: Diagnosis not present

## 2022-04-07 DIAGNOSIS — Z9889 Other specified postprocedural states: Secondary | ICD-10-CM

## 2022-04-11 ENCOUNTER — Other Ambulatory Visit: Payer: Self-pay | Admitting: Internal Medicine

## 2022-04-11 DIAGNOSIS — E1151 Type 2 diabetes mellitus with diabetic peripheral angiopathy without gangrene: Secondary | ICD-10-CM

## 2022-04-23 ENCOUNTER — Emergency Department (HOSPITAL_COMMUNITY)
Admission: EM | Admit: 2022-04-23 | Discharge: 2022-04-24 | Disposition: A | Discharge status: Home / Self Care | Payer: Medicare Other | Attending: Emergency Medicine | Admitting: Emergency Medicine

## 2022-04-23 ENCOUNTER — Emergency Department (HOSPITAL_COMMUNITY): Payer: Medicare Other

## 2022-04-23 ENCOUNTER — Other Ambulatory Visit: Payer: Self-pay

## 2022-04-23 ENCOUNTER — Encounter (HOSPITAL_COMMUNITY): Payer: Self-pay

## 2022-04-23 DIAGNOSIS — J439 Emphysema, unspecified: Secondary | ICD-10-CM | POA: Diagnosis not present

## 2022-04-23 DIAGNOSIS — Z79899 Other long term (current) drug therapy: Secondary | ICD-10-CM | POA: Insufficient documentation

## 2022-04-23 DIAGNOSIS — I130 Hypertensive heart and chronic kidney disease with heart failure and stage 1 through stage 4 chronic kidney disease, or unspecified chronic kidney disease: Secondary | ICD-10-CM | POA: Diagnosis not present

## 2022-04-23 DIAGNOSIS — N289 Disorder of kidney and ureter, unspecified: Secondary | ICD-10-CM

## 2022-04-23 DIAGNOSIS — R0602 Shortness of breath: Secondary | ICD-10-CM

## 2022-04-23 DIAGNOSIS — N189 Chronic kidney disease, unspecified: Secondary | ICD-10-CM | POA: Diagnosis not present

## 2022-04-23 DIAGNOSIS — Z7901 Long term (current) use of anticoagulants: Secondary | ICD-10-CM

## 2022-04-23 DIAGNOSIS — I5033 Acute on chronic diastolic (congestive) heart failure: Secondary | ICD-10-CM

## 2022-04-23 DIAGNOSIS — Z7984 Long term (current) use of oral hypoglycemic drugs: Secondary | ICD-10-CM | POA: Diagnosis not present

## 2022-04-23 DIAGNOSIS — Z7951 Long term (current) use of inhaled steroids: Secondary | ICD-10-CM | POA: Insufficient documentation

## 2022-04-23 DIAGNOSIS — D649 Anemia, unspecified: Secondary | ICD-10-CM

## 2022-04-23 DIAGNOSIS — I4891 Unspecified atrial fibrillation: Secondary | ICD-10-CM | POA: Diagnosis not present

## 2022-04-23 DIAGNOSIS — E1122 Type 2 diabetes mellitus with diabetic chronic kidney disease: Secondary | ICD-10-CM | POA: Insufficient documentation

## 2022-04-23 DIAGNOSIS — I2510 Atherosclerotic heart disease of native coronary artery without angina pectoris: Secondary | ICD-10-CM | POA: Diagnosis not present

## 2022-04-23 DIAGNOSIS — J441 Chronic obstructive pulmonary disease with (acute) exacerbation: Secondary | ICD-10-CM

## 2022-04-23 LAB — TROPONIN I (HIGH SENSITIVITY): Troponin I (High Sensitivity): 17 ng/L (ref <18)

## 2022-04-23 LAB — CBC
HCT: 39.1 % (ref 39.0–52.0)
Hemoglobin: 12.5 g/dL — ABNORMAL LOW (ref 13.0–17.0)
MCH: 29.1 pg (ref 26.0–34.0)
MCHC: 32 g/dL (ref 30.0–36.0)
MCV: 91.1 fL (ref 80.0–100.0)
Platelets: 207 10*3/uL (ref 150–400)
RBC: 4.29 MIL/uL (ref 4.22–5.81)
RDW: 15.3 % (ref 11.5–15.5)
WBC: 10 10*3/uL (ref 4.0–10.5)
nRBC: 0 % (ref 0.0–0.2)

## 2022-04-23 LAB — BASIC METABOLIC PANEL
Anion gap: 11 (ref 5–15)
BUN: 24 mg/dL — ABNORMAL HIGH (ref 8–23)
CO2: 29 mmol/L (ref 22–32)
Calcium: 9.5 mg/dL (ref 8.9–10.3)
Chloride: 99 mmol/L (ref 98–111)
Creatinine, Ser: 1.8 mg/dL — ABNORMAL HIGH (ref 0.61–1.24)
GFR, Estimated: 37 mL/min — ABNORMAL LOW (ref >60)
Glucose, Bld: 220 mg/dL — ABNORMAL HIGH (ref 70–99)
Potassium: 3.6 mmol/L (ref 3.5–5.1)
Sodium: 139 mmol/L (ref 135–145)
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
## 2022-04-23 NOTE — ED Notes (Signed)
Patient transported to X-ray 

## 2022-04-24 DIAGNOSIS — J441 Chronic obstructive pulmonary disease with (acute) exacerbation: Secondary | ICD-10-CM | POA: Diagnosis not present

## 2022-04-24 DIAGNOSIS — Z955 Presence of coronary angioplasty implant and graft: Secondary | ICD-10-CM | POA: Diagnosis not present

## 2022-04-24 DIAGNOSIS — R0602 Shortness of breath: Secondary | ICD-10-CM | POA: Diagnosis not present

## 2022-04-24 DIAGNOSIS — J9811 Atelectasis: Secondary | ICD-10-CM | POA: Diagnosis not present

## 2022-04-24 DIAGNOSIS — J9602 Acute respiratory failure with hypercapnia: Secondary | ICD-10-CM | POA: Diagnosis not present

## 2022-04-24 DIAGNOSIS — E876 Hypokalemia: Secondary | ICD-10-CM | POA: Diagnosis not present

## 2022-04-24 DIAGNOSIS — N1832 Chronic kidney disease, stage 3b: Secondary | ICD-10-CM | POA: Diagnosis not present

## 2022-04-24 DIAGNOSIS — J439 Emphysema, unspecified: Secondary | ICD-10-CM | POA: Diagnosis not present

## 2022-04-24 DIAGNOSIS — E1165 Type 2 diabetes mellitus with hyperglycemia: Secondary | ICD-10-CM | POA: Diagnosis not present

## 2022-04-24 DIAGNOSIS — Z1152 Encounter for screening for COVID-19: Secondary | ICD-10-CM | POA: Diagnosis not present

## 2022-04-24 DIAGNOSIS — I4891 Unspecified atrial fibrillation: Secondary | ICD-10-CM | POA: Diagnosis not present

## 2022-04-24 DIAGNOSIS — E785 Hyperlipidemia, unspecified: Secondary | ICD-10-CM | POA: Diagnosis not present

## 2022-04-24 DIAGNOSIS — F05 Delirium due to known physiological condition: Secondary | ICD-10-CM | POA: Diagnosis not present

## 2022-04-24 DIAGNOSIS — J90 Pleural effusion, not elsewhere classified: Secondary | ICD-10-CM | POA: Diagnosis not present

## 2022-04-24 DIAGNOSIS — F17210 Nicotine dependence, cigarettes, uncomplicated: Secondary | ICD-10-CM | POA: Diagnosis not present

## 2022-04-24 DIAGNOSIS — R0902 Hypoxemia: Secondary | ICD-10-CM | POA: Diagnosis not present

## 2022-04-24 DIAGNOSIS — J449 Chronic obstructive pulmonary disease, unspecified: Secondary | ICD-10-CM | POA: Diagnosis not present

## 2022-04-24 DIAGNOSIS — J181 Lobar pneumonia, unspecified organism: Secondary | ICD-10-CM | POA: Diagnosis not present

## 2022-04-24 DIAGNOSIS — R410 Disorientation, unspecified: Secondary | ICD-10-CM | POA: Diagnosis not present

## 2022-04-24 DIAGNOSIS — E538 Deficiency of other specified B group vitamins: Secondary | ICD-10-CM | POA: Diagnosis not present

## 2022-04-24 DIAGNOSIS — J9601 Acute respiratory failure with hypoxia: Secondary | ICD-10-CM | POA: Diagnosis not present

## 2022-04-24 DIAGNOSIS — J9611 Chronic respiratory failure with hypoxia: Secondary | ICD-10-CM | POA: Diagnosis not present

## 2022-04-24 DIAGNOSIS — R000 Tachycardia, unspecified: Secondary | ICD-10-CM | POA: Diagnosis not present

## 2022-04-24 DIAGNOSIS — Z794 Long term (current) use of insulin: Secondary | ICD-10-CM | POA: Diagnosis not present

## 2022-04-24 DIAGNOSIS — R911 Solitary pulmonary nodule: Secondary | ICD-10-CM | POA: Diagnosis not present

## 2022-04-24 DIAGNOSIS — N400 Enlarged prostate without lower urinary tract symptoms: Secondary | ICD-10-CM | POA: Diagnosis present

## 2022-04-24 DIAGNOSIS — I4821 Permanent atrial fibrillation: Secondary | ICD-10-CM | POA: Diagnosis not present

## 2022-04-24 DIAGNOSIS — I130 Hypertensive heart and chronic kidney disease with heart failure and stage 1 through stage 4 chronic kidney disease, or unspecified chronic kidney disease: Secondary | ICD-10-CM | POA: Diagnosis not present

## 2022-04-24 DIAGNOSIS — I5032 Chronic diastolic (congestive) heart failure: Secondary | ICD-10-CM | POA: Diagnosis not present

## 2022-04-24 DIAGNOSIS — I499 Cardiac arrhythmia, unspecified: Secondary | ICD-10-CM | POA: Diagnosis not present

## 2022-04-24 DIAGNOSIS — Z9981 Dependence on supplemental oxygen: Secondary | ICD-10-CM | POA: Diagnosis not present

## 2022-04-24 DIAGNOSIS — I2510 Atherosclerotic heart disease of native coronary artery without angina pectoris: Secondary | ICD-10-CM | POA: Diagnosis not present

## 2022-04-24 DIAGNOSIS — E1122 Type 2 diabetes mellitus with diabetic chronic kidney disease: Secondary | ICD-10-CM | POA: Diagnosis not present

## 2022-04-24 DIAGNOSIS — R6889 Other general symptoms and signs: Secondary | ICD-10-CM | POA: Diagnosis not present

## 2022-04-24 DIAGNOSIS — I252 Old myocardial infarction: Secondary | ICD-10-CM | POA: Diagnosis not present

## 2022-04-24 DIAGNOSIS — Z8249 Family history of ischemic heart disease and other diseases of the circulatory system: Secondary | ICD-10-CM | POA: Diagnosis not present

## 2022-04-24 DIAGNOSIS — K7460 Unspecified cirrhosis of liver: Secondary | ICD-10-CM | POA: Diagnosis not present

## 2022-04-24 DIAGNOSIS — Z79899 Other long term (current) drug therapy: Secondary | ICD-10-CM | POA: Diagnosis not present

## 2022-04-24 DIAGNOSIS — Z743 Need for continuous supervision: Secondary | ICD-10-CM | POA: Diagnosis not present

## 2022-04-24 LAB — TROPONIN I (HIGH SENSITIVITY): Troponin I (High Sensitivity): 16 ng/L (ref <18)

## 2022-04-24 LAB — BRAIN NATRIURETIC PEPTIDE: B Natriuretic Peptide: 614 pg/mL — ABNORMAL HIGH (ref 0.0–100.0)

## 2022-04-24 MED ORDER — FUROSEMIDE 10 MG/ML IJ SOLN: 80.0000 mg | Freq: Once | INTRAMUSCULAR | Status: DC

## 2022-04-24 MED ORDER — FUROSEMIDE 10 MG/ML IJ SOLN
80.0000 mg | Freq: Once | INTRAMUSCULAR | Status: AC
  Administered 2022-04-24: 80 mg via INTRAMUSCULAR
  Filled 2022-04-24: qty 8

## 2022-04-24 MED ORDER — IPRATROPIUM-ALBUTEROL 0.5-2.5 (3) MG/3ML IN SOLN
3.0000 mL | Freq: Once | RESPIRATORY_TRACT | Status: AC
  Administered 2022-04-24: 3 mL via RESPIRATORY_TRACT
  Filled 2022-04-24: qty 3

## 2022-04-24 MED ORDER — PREDNISONE 50 MG PO TABS: 50.0000 mg | ORAL_TABLET | Freq: Every day | ORAL | 0 refills | Status: DC

## 2022-04-24 MED ORDER — PREDNISONE 50 MG PO TABS
60.0000 mg | ORAL_TABLET | Freq: Once | ORAL | Status: AC
  Administered 2022-04-24: 60 mg via ORAL
  Filled 2022-04-24: qty 1

## 2022-04-24 NOTE — Discharge Instructions (Addendum)
If you are in an area which is smoky, please get to an area with an open window or leave the area so that you are not exposed to the smoke.  Continue taking the same dose of furosemide and continue using your inhaler as needed.  Please get in the habit of weighing yourself every day.  You will need to work with your cardiologist to determine at what point you need to increase your furosemide dose.  Return if symptoms are getting worse.

## 2022-04-24 NOTE — ED Provider Notes (Signed)
[place] Provider Note   CSN: [id number] Arrival date & time: 04/23/22  2109     History  Chief Complaint  Patient presents with   Shortness of Breath    Jason Fisher is a 82 y.o. male.  The history is provided by the patient.  Shortness of Breath He has history of hypertension, diabetes, hyperlipidemia, coronary artery disease, chronic kidney disease, diastolic heart failure, atrial fibrillation anticoagulated on apixaban, COPD and comes in because of shortness of breath at home.  He states that his wife was cooking some bacon and house became filled with smoke and his breathing got significantly worse.  He denies any chest pain, heaviness, tightness, pressure.  His breathing feels better now.  He states that normally he has exertional dyspnea and orthopnea and has to sleep in a recliner.  As a separate complaint, he has been having aching in his left arm for about the last 3 weeks.  He denies any recent trauma.  He has had rotator cuff surgery and does not recall if it was on that side.   Home Medications Prior to Admission medications   Medication Sig Start Date End Date Taking? Authorizing Provider  ACCU-CHEK AVIVA PLUS test strip CHECK BLOOD SUGAR ONCE DAILY. DX E11.51 06/29/19   [name], [name], MD  apixaban (ELIQUIS) 2.5 MG TABS tablet Take 1 tablet (2.5 mg total) by mouth 2 (two) times daily. 01/27/22   Martinique, Peter M, MD  atorvastatin (LIPITOR) 40 MG tablet TAKE ONE TABLET BY MOUTH ONCE DAILY 10/28/21   [name], [name], MD  Continuous Blood Gluc Receiver (DEXCOM G6 RECEIVER) DEVI CHECK VLOOD SUGAR AS DIRECTED AS DIRECTED 10/23/21   [name], [name], MD  Continuous Blood Gluc Sensor (DEXCOM G6 SENSOR) MISC APPLY 1 SENSOR EVERY 10 DAYS 10/14/21   [name], [name], MD  Continuous Blood Gluc Transmit (DEXCOM G6 TRANSMITTER) MISC APPLY TO SENSOR EVERY 90 DAYS 10/23/21   [name], [name],  MD  diltiazem (CARDIZEM CD) 120 MG 24 hr capsule TAKE ONE CAPSULE BY MOUTH ONCE DAILY 10/28/21   [name], [name], MD  furosemide (LASIX) 80 MG tablet TAKE ONE TABLET BY MOUTH ONCE DAILY 03/26/22   [name], [name], MD  isosorbide mononitrate (IMDUR) 30 MG 24 hr tablet Take 1 tablet (30 mg total) by mouth daily. 10/13/21 10/08/22  Martinique, Peter M, MD  levalbuterol [facility] HFA) 45 MCG/ACT inhaler INHALE 1 TO 2 PUFFS BY MOUTH EVERY 6 HOURS AS NEEDED FOR WHEEZE 08/11/21   Collene Gobble, MD  metFORMIN (GLUCOPHAGE) 1000 MG tablet TAKE ONE TABLET BY MOUTH AT [facility] AND AT BEDTIME 03/26/22   [name], [name], MD  metoprolol tartrate (LOPRESSOR) 100 MG tablet TAKE ONE TABLET BY MOUTH AT [facility] AND AT BEDTIME 01/27/22   Martinique, Peter M, MD  nitroGLYCERIN (NITROSTAT) 0.4 MG SL tablet Place 1 tablet (0.4 mg total) under the tongue every 5 (five) minutes as needed for chest pain. X 3 doses 06/09/21   [name], [name], MD  olmesartan (BENICAR) 5 MG tablet Take 10 mg by mouth daily. 04/29/21   [provider]  polyethylene glycol (MIRALAX / GLYCOLAX) packet Take 17 g by mouth daily as needed for mild constipation.    [provider]  Tiotropium Bromide-Olodaterol (STIOLTO RESPIMAT) 2.5-2.5 MCG/ACT AERS Inhale 2 puffs into the lungs daily. 01/02/22   Collene Gobble, MD  TRESIBA FLEXTOUCH 100 UNIT/ML  FlexTouch Pen Inject 32 units into THE SKIN AT bedtime 04/13/22   [name], [name], MD      Allergies    Contrast media [iodinated contrast media]    Review of Systems   Review of Systems  Respiratory:  Positive for shortness of breath.   All other systems reviewed and are negative.   Physical Exam Updated Vital Signs BP (!) 159/122   Pulse 93   Temp 97.9 F (36.6 C) (Temporal)   Resp (!) 24   Ht 5\' 8"  (1.727 m)   Wt 73.9 kg   SpO2 92%   BMI 24.78 kg/m  Physical Exam Vitals and nursing note reviewed.   82 year old male, resting  comfortably and in no acute distress. Vital signs are significant for mildly elevated respiratory rate and elevated blood pressure. Oxygen saturation is 92%, which is normal. Head is normocephalic and atraumatic. PERRLA, EOMI. Oropharynx is clear. Neck is nontender and supple without adenopathy or JVD. Back is nontender and there is no CVA tenderness.  There is trace presacral edema. Lungs have fine rales at the left base.  There are no wheezes or rhonchi. Chest is nontender. Heart has regular rate and rhythm without murmur. Abdomen is soft, flat, nontender. Extremities have no cyanosis or edema, full range of motion is present.  There is mild tenderness to palpation in the left anterior deltoid groove, +/- rotator cuff impingement signs. Skin is warm and dry without rash. Neurologic: Mental status is normal, cranial nerves are intact, moves all extremities equally.  ED Results / Procedures / Treatments   Labs (all labs ordered are listed, but only abnormal results are displayed) Labs Reviewed  BASIC METABOLIC PANEL - Abnormal; Notable for the following components:      Result Value   Glucose, Bld 220 (*)    BUN 24 (*)    Creatinine, Ser 1.80 (*)    GFR, Estimated 37 (*)    All other components within normal limits  CBC - Abnormal; Notable for the following components:   Hemoglobin 12.5 (*)    All other components within normal limits  TROPONIN I (HIGH SENSITIVITY)  TROPONIN I (HIGH SENSITIVITY)    EKG EKG Interpretation  Date/Time:  Thursday April 23 2022 22:46:56 EDT Ventricular Rate:  94 PR Interval:    QRS Duration: 97 QT Interval:  357 QTC Calculation: 447 R Axis:   42 Text Interpretation: Atrial fibrillation Nonspecific repol abnormality, diffuse leads No significant change since prior 4/22 Confirmed by Aletta Edouard [phone]) on 04/23/2022 10:50:53 PM  Radiology DG Chest 2 View  Result Date: 04/23/2022 CLINICAL DATA:  Shortness of breath EXAM: CHEST - 2 VIEW  COMPARISON:  05/10/2020, CT 01/15/2016 FINDINGS: Hyperinflation with emphysema. Trace pleural effusions versus pleural scarring. Normal cardiac size. Aortic atherosclerosis. No acute airspace disease IMPRESSION: Hyperinflation with emphysema. Trace pleural effusions versus pleural scarring. Electronically Signed   By: Donavan Foil M.D.   On: 04/23/2022 21:43    Procedures Procedures  Cardiac monitor shows atrial fibrillation, per my interpretation.  Medications Ordered in ED Medications  furosemide (LASIX) injection 80 mg (has no administration in time range)  predniSONE (DELTASONE) tablet 60 mg (has no administration in time range)  ipratropium-albuterol (DUONEB) 0.5-2.5 (3) MG/3ML nebulizer solution 3 mL (3 mLs Nebulization Given 04/24/22 0139)    ED Course/ Medical Decision Making/ A&P  Medical Decision Making Amount and/or Complexity of Data Reviewed Labs: ordered. Radiology: ordered.  Risk Prescription drug management.   Exacerbation of chronic dyspnea likely secondary to smoke exposure.  Exam today consistent with mild heart failure, no clinical COPD exacerbation.  Chest x-ray shows emphysema without any acute airspace abnormality.  I have independently viewed the images, and agree with radiologist interpretation.  I have reviewed and interpreted his electrocardiogram, and my interpretation is atrial fibrillation with nonspecific repolarization abnormalities unchanged from prior.  I have reviewed and interpreted his laboratory tests, and my interpretation is mild anemia which is new compared with 10/13/2021, renal insufficiency which has progressed slightly compared with 10/13/2021, normal troponin x 2.  I have ordered brain natruretic protein level to evaluate possible exacerbation of heart failure.  I have also ordered a therapeutic trial of albuterol with ipratropium to see if this is a COPD exacerbation.  BMP is elevated at a level similar to when he was  admitted to the hospital on 2022 with CHF exacerbation.  He also had significant improvement with nebulizer treatment with albuterol and ipratropium.  I believe his dyspnea is multifactorial with an element of COPD exacerbation, an element of heart failure exacerbation, and an element of exposure to an irritant.  I have ordered a dose of prednisone and a dose of furosemide.  I am discharging him with a short course of prednisone.  He is to continue on his current dose of furosemide.  I have encouraged him to weigh himself every day and to work with his cardiologist regarding action plan to deal with heart failure exacerbation evidenced by weight gain.  Final Clinical Impression(s) / ED Diagnoses Final diagnoses:  Shortness of breath  COPD exacerbation (HCC)  Acute on chronic diastolic heart failure (HCC)  Normochromic normocytic anemia  Renal insufficiency  Chronic anticoagulation    Rx / DC Orders ED Discharge Orders          Ordered    predniSONE (DELTASONE) 50 MG tablet  Daily        04/24/22 [id number]              Delora Fuel, MD [id number] [phone]

## 2022-04-26 ENCOUNTER — Other Ambulatory Visit: Payer: Self-pay | Admitting: Internal Medicine

## 2022-04-26 ENCOUNTER — Other Ambulatory Visit: Payer: Self-pay | Admitting: Cardiology

## 2022-04-26 DIAGNOSIS — E785 Hyperlipidemia, unspecified: Secondary | ICD-10-CM

## 2022-04-26 DIAGNOSIS — I480 Paroxysmal atrial fibrillation: Secondary | ICD-10-CM

## 2022-04-27 ENCOUNTER — Inpatient Hospital Stay (HOSPITAL_COMMUNITY)
Admission: EM | Admit: 2022-04-27 | Discharge: 2022-05-02 | DRG: 190 | Disposition: A | Discharge status: Home Health | Payer: Medicare Other | Attending: Family Medicine | Admitting: Family Medicine

## 2022-04-27 ENCOUNTER — Emergency Department (HOSPITAL_COMMUNITY): Payer: Medicare Other

## 2022-04-27 ENCOUNTER — Encounter (HOSPITAL_COMMUNITY): Payer: Self-pay | Admitting: Emergency Medicine

## 2022-04-27 ENCOUNTER — Other Ambulatory Visit: Payer: Self-pay

## 2022-04-27 DIAGNOSIS — R410 Disorientation, unspecified: Secondary | ICD-10-CM | POA: Diagnosis not present

## 2022-04-27 DIAGNOSIS — Z79899 Other long term (current) drug therapy: Secondary | ICD-10-CM | POA: Diagnosis not present

## 2022-04-27 DIAGNOSIS — E785 Hyperlipidemia, unspecified: Secondary | ICD-10-CM | POA: Diagnosis present

## 2022-04-27 DIAGNOSIS — E1151 Type 2 diabetes mellitus with diabetic peripheral angiopathy without gangrene: Secondary | ICD-10-CM

## 2022-04-27 DIAGNOSIS — I4891 Unspecified atrial fibrillation: Secondary | ICD-10-CM | POA: Diagnosis not present

## 2022-04-27 DIAGNOSIS — Z91041 Radiographic dye allergy status: Secondary | ICD-10-CM

## 2022-04-27 DIAGNOSIS — Z955 Presence of coronary angioplasty implant and graft: Secondary | ICD-10-CM | POA: Diagnosis not present

## 2022-04-27 DIAGNOSIS — E538 Deficiency of other specified B group vitamins: Secondary | ICD-10-CM | POA: Diagnosis present

## 2022-04-27 DIAGNOSIS — J441 Chronic obstructive pulmonary disease with (acute) exacerbation: Principal | ICD-10-CM | POA: Diagnosis present

## 2022-04-27 DIAGNOSIS — Z716 Tobacco abuse counseling: Secondary | ICD-10-CM

## 2022-04-27 DIAGNOSIS — R000 Tachycardia, unspecified: Secondary | ICD-10-CM | POA: Diagnosis present

## 2022-04-27 DIAGNOSIS — Z1152 Encounter for screening for COVID-19: Secondary | ICD-10-CM | POA: Diagnosis not present

## 2022-04-27 DIAGNOSIS — R0902 Hypoxemia: Secondary | ICD-10-CM | POA: Diagnosis not present

## 2022-04-27 DIAGNOSIS — J9602 Acute respiratory failure with hypercapnia: Secondary | ICD-10-CM | POA: Diagnosis present

## 2022-04-27 DIAGNOSIS — N400 Enlarged prostate without lower urinary tract symptoms: Secondary | ICD-10-CM | POA: Diagnosis present

## 2022-04-27 DIAGNOSIS — Z794 Long term (current) use of insulin: Secondary | ICD-10-CM

## 2022-04-27 DIAGNOSIS — E1165 Type 2 diabetes mellitus with hyperglycemia: Secondary | ICD-10-CM | POA: Diagnosis not present

## 2022-04-27 DIAGNOSIS — R0602 Shortness of breath: Secondary | ICD-10-CM | POA: Diagnosis not present

## 2022-04-27 DIAGNOSIS — J9601 Acute respiratory failure with hypoxia: Secondary | ICD-10-CM | POA: Diagnosis present

## 2022-04-27 DIAGNOSIS — Z9981 Dependence on supplemental oxygen: Secondary | ICD-10-CM

## 2022-04-27 DIAGNOSIS — Z8249 Family history of ischemic heart disease and other diseases of the circulatory system: Secondary | ICD-10-CM | POA: Diagnosis not present

## 2022-04-27 DIAGNOSIS — E876 Hypokalemia: Secondary | ICD-10-CM | POA: Diagnosis present

## 2022-04-27 DIAGNOSIS — I4821 Permanent atrial fibrillation: Secondary | ICD-10-CM | POA: Diagnosis present

## 2022-04-27 DIAGNOSIS — I252 Old myocardial infarction: Secondary | ICD-10-CM

## 2022-04-27 DIAGNOSIS — I2510 Atherosclerotic heart disease of native coronary artery without angina pectoris: Secondary | ICD-10-CM | POA: Diagnosis present

## 2022-04-27 DIAGNOSIS — Z743 Need for continuous supervision: Secondary | ICD-10-CM | POA: Diagnosis not present

## 2022-04-27 DIAGNOSIS — R6889 Other general symptoms and signs: Secondary | ICD-10-CM | POA: Diagnosis not present

## 2022-04-27 DIAGNOSIS — N1832 Chronic kidney disease, stage 3b: Secondary | ICD-10-CM | POA: Diagnosis present

## 2022-04-27 DIAGNOSIS — Z7901 Long term (current) use of anticoagulants: Secondary | ICD-10-CM

## 2022-04-27 DIAGNOSIS — Z833 Family history of diabetes mellitus: Secondary | ICD-10-CM

## 2022-04-27 DIAGNOSIS — E1122 Type 2 diabetes mellitus with diabetic chronic kidney disease: Secondary | ICD-10-CM | POA: Diagnosis present

## 2022-04-27 DIAGNOSIS — J9811 Atelectasis: Secondary | ICD-10-CM | POA: Diagnosis not present

## 2022-04-27 DIAGNOSIS — T380X5A Adverse effect of glucocorticoids and synthetic analogues, initial encounter: Secondary | ICD-10-CM | POA: Diagnosis not present

## 2022-04-27 DIAGNOSIS — I5032 Chronic diastolic (congestive) heart failure: Secondary | ICD-10-CM | POA: Diagnosis present

## 2022-04-27 DIAGNOSIS — F17210 Nicotine dependence, cigarettes, uncomplicated: Secondary | ICD-10-CM | POA: Diagnosis present

## 2022-04-27 DIAGNOSIS — I130 Hypertensive heart and chronic kidney disease with heart failure and stage 1 through stage 4 chronic kidney disease, or unspecified chronic kidney disease: Secondary | ICD-10-CM | POA: Diagnosis present

## 2022-04-27 DIAGNOSIS — F05 Delirium due to known physiological condition: Secondary | ICD-10-CM | POA: Diagnosis not present

## 2022-04-27 DIAGNOSIS — J189 Pneumonia, unspecified organism: Secondary | ICD-10-CM

## 2022-04-27 DIAGNOSIS — J90 Pleural effusion, not elsewhere classified: Secondary | ICD-10-CM | POA: Diagnosis not present

## 2022-04-27 DIAGNOSIS — J181 Lobar pneumonia, unspecified organism: Secondary | ICD-10-CM | POA: Diagnosis not present

## 2022-04-27 DIAGNOSIS — Z7984 Long term (current) use of oral hypoglycemic drugs: Secondary | ICD-10-CM

## 2022-04-27 DIAGNOSIS — K7460 Unspecified cirrhosis of liver: Secondary | ICD-10-CM | POA: Diagnosis present

## 2022-04-27 DIAGNOSIS — T380X6A Underdosing of glucocorticoids and synthetic analogues, initial encounter: Secondary | ICD-10-CM | POA: Diagnosis present

## 2022-04-27 DIAGNOSIS — I499 Cardiac arrhythmia, unspecified: Secondary | ICD-10-CM | POA: Diagnosis not present

## 2022-04-27 DIAGNOSIS — J449 Chronic obstructive pulmonary disease, unspecified: Secondary | ICD-10-CM | POA: Diagnosis present

## 2022-04-27 DIAGNOSIS — J9611 Chronic respiratory failure with hypoxia: Secondary | ICD-10-CM | POA: Diagnosis not present

## 2022-04-27 LAB — CBC WITH DIFFERENTIAL/PLATELET
Abs Immature Granulocytes: 0.09 10*3/uL — ABNORMAL HIGH (ref 0.00–0.07)
Basophils Absolute: 0 10*3/uL (ref 0.0–0.1)
Basophils Relative: 0 %
Eosinophils Absolute: 0 10*3/uL (ref 0.0–0.5)
Eosinophils Relative: 0 %
HCT: 39.1 % (ref 39.0–52.0)
Hemoglobin: 12.7 g/dL — ABNORMAL LOW (ref 13.0–17.0)
Immature Granulocytes: 1 %
Lymphocytes Relative: 15 %
Lymphs Abs: 2.3 10*3/uL (ref 0.7–4.0)
MCH: 29.6 pg (ref 26.0–34.0)
MCHC: 32.5 g/dL (ref 30.0–36.0)
MCV: 91.1 fL (ref 80.0–100.0)
Monocytes Absolute: 2 10*3/uL — ABNORMAL HIGH (ref 0.1–1.0)
Monocytes Relative: 13 %
Neutro Abs: 11.4 10*3/uL — ABNORMAL HIGH (ref 1.7–7.7)
Neutrophils Relative %: 71 %
Platelets: 242 10*3/uL (ref 150–400)
RBC: 4.29 MIL/uL (ref 4.22–5.81)
RDW: 15.3 % (ref 11.5–15.5)
WBC: 15.8 10*3/uL — ABNORMAL HIGH (ref 4.0–10.5)
nRBC: 0 % (ref 0.0–0.2)

## 2022-04-27 LAB — COMPREHENSIVE METABOLIC PANEL
ALT: 20 U/L (ref 0–44)
AST: 24 U/L (ref 15–41)
Albumin: 3.8 g/dL (ref 3.5–5.0)
Alkaline Phosphatase: 71 U/L (ref 38–126)
Anion gap: 14 (ref 5–15)
BUN: 31 mg/dL — ABNORMAL HIGH (ref 8–23)
CO2: 27 mmol/L (ref 22–32)
Calcium: 9.2 mg/dL (ref 8.9–10.3)
Chloride: 102 mmol/L (ref 98–111)
Creatinine, Ser: 1.53 mg/dL — ABNORMAL HIGH (ref 0.61–1.24)
GFR, Estimated: 45 mL/min — ABNORMAL LOW (ref >60)
Glucose, Bld: 122 mg/dL — ABNORMAL HIGH (ref 70–99)
Potassium: 3.2 mmol/L — ABNORMAL LOW (ref 3.5–5.1)
Sodium: 143 mmol/L (ref 135–145)
Total Bilirubin: 0.8 mg/dL (ref 0.3–1.2)
Total Protein: 8.1 g/dL (ref 6.5–8.1)

## 2022-04-27 LAB — RESP PANEL BY RT-PCR (RSV, FLU A&B, COVID)  RVPGX2
Influenza A by PCR: NEGATIVE
Influenza B by PCR: NEGATIVE
Resp Syncytial Virus by PCR: NEGATIVE
SARS Coronavirus 2 by RT PCR: NEGATIVE

## 2022-04-27 LAB — BLOOD GAS, VENOUS
Acid-Base Excess: 7 mmol/L — ABNORMAL HIGH (ref 0.0–2.0)
Bicarbonate: 34.5 mmol/L — ABNORMAL HIGH (ref 20.0–28.0)
Drawn by: 7534
O2 Saturation: 46.4 %
Patient temperature: 36.7
pCO2, Ven: 60 mmHg (ref 44–60)
pH, Ven: 7.36 (ref 7.25–7.43)
pO2, Ven: <31 mmHg — CL (ref 32–45)

## 2022-04-27 LAB — MAGNESIUM: Magnesium: 1.9 mg/dL (ref 1.7–2.4)

## 2022-04-27 LAB — BRAIN NATRIURETIC PEPTIDE: B Natriuretic Peptide: 747 pg/mL — ABNORMAL HIGH (ref 0.0–100.0)

## 2022-04-27 LAB — GLUCOSE, CAPILLARY
Glucose-Capillary: 342 mg/dL — ABNORMAL HIGH (ref 70–99)
Glucose-Capillary: 325 mg/dL — ABNORMAL HIGH (ref 70–99)

## 2022-04-27 LAB — TROPONIN I (HIGH SENSITIVITY)
Troponin I (High Sensitivity): 38 ng/L — ABNORMAL HIGH (ref <18)
Troponin I (High Sensitivity): 85 ng/L — ABNORMAL HIGH (ref <18)

## 2022-04-27 MED ORDER — ARFORMOTEROL TARTRATE 15 MCG/2ML IN NEBU
15.0000 ug | INHALATION_SOLUTION | Freq: Two times a day (BID) | RESPIRATORY_TRACT | Status: DC
  Administered 2022-04-27 – 2022-04-30 (×7): 15 ug via RESPIRATORY_TRACT
  Filled 2022-04-27 (×8): qty 2

## 2022-04-27 MED ORDER — POTASSIUM CHLORIDE CRYS ER 20 MEQ PO TBCR
40.0000 meq | EXTENDED_RELEASE_TABLET | Freq: Once | ORAL | Status: AC
  Administered 2022-04-27: 40 meq via ORAL
  Filled 2022-04-27: qty 2

## 2022-04-27 MED ORDER — ATORVASTATIN CALCIUM 40 MG PO TABS
40.0000 mg | ORAL_TABLET | Freq: Every day | ORAL | Status: DC
  Administered 2022-04-27 – 2022-05-02 (×5): 40 mg via ORAL
  Filled 2022-04-27 (×6): qty 1

## 2022-04-27 MED ORDER — SODIUM CHLORIDE 0.9 % IV SOLN
1.0000 g | INTRAVENOUS | Status: DC
  Administered 2022-04-28 – 2022-04-29 (×2): 1 g via INTRAVENOUS
  Filled 2022-04-27 (×2): qty 10

## 2022-04-27 MED ORDER — METOPROLOL TARTRATE 50 MG PO TABS
100.0000 mg | ORAL_TABLET | Freq: Two times a day (BID) | ORAL | Status: DC
  Administered 2022-04-27 – 2022-05-02 (×9): 100 mg via ORAL
  Filled 2022-04-27 (×10): qty 2

## 2022-04-27 MED ORDER — DOXYCYCLINE HYCLATE 100 MG PO TABS
100.0000 mg | ORAL_TABLET | Freq: Two times a day (BID) | ORAL | Status: AC
  Administered 2022-04-27 – 2022-05-01 (×9): 100 mg via ORAL
  Filled 2022-04-27 (×9): qty 1

## 2022-04-27 MED ORDER — APIXABAN 2.5 MG PO TABS
2.5000 mg | ORAL_TABLET | Freq: Two times a day (BID) | ORAL | Status: DC
  Administered 2022-04-27 – 2022-05-02 (×10): 2.5 mg via ORAL
  Filled 2022-04-27 (×10): qty 1

## 2022-04-27 MED ORDER — INSULIN ASPART 100 UNIT/ML IJ SOLN
0.0000 [IU] | Freq: Every day | INTRAMUSCULAR | Status: DC
  Administered 2022-04-27: 4 [IU] via SUBCUTANEOUS

## 2022-04-27 MED ORDER — SODIUM CHLORIDE 0.9 % IV SOLN
1.0000 g | Freq: Once | INTRAVENOUS | Status: AC
  Administered 2022-04-27: 1 g via INTRAVENOUS
  Filled 2022-04-27: qty 10

## 2022-04-27 MED ORDER — INSULIN ASPART 100 UNIT/ML IJ SOLN
0.0000 [IU] | Freq: Three times a day (TID) | INTRAMUSCULAR | Status: DC
  Administered 2022-04-27: 7 [IU] via SUBCUTANEOUS
  Administered 2022-04-28: 5 [IU] via SUBCUTANEOUS

## 2022-04-27 MED ORDER — CHLORHEXIDINE GLUCONATE CLOTH 2 % EX PADS
6.0000 | MEDICATED_PAD | Freq: Every day | CUTANEOUS | Status: DC
  Administered 2022-04-27 – 2022-04-30 (×4): 6 via TOPICAL

## 2022-04-27 MED ORDER — ACETAMINOPHEN 325 MG PO TABS
650.0000 mg | ORAL_TABLET | Freq: Four times a day (QID) | ORAL | Status: DC | PRN
  Administered 2022-05-02: 650 mg via ORAL
  Filled 2022-04-27: qty 2

## 2022-04-27 MED ORDER — POLYETHYLENE GLYCOL 3350 17 G PO PACK: 17.0000 g | PACK | Freq: Every day | ORAL | Status: DC | PRN

## 2022-04-27 MED ORDER — ISOSORBIDE MONONITRATE ER 60 MG PO TB24
30.0000 mg | ORAL_TABLET | Freq: Every day | ORAL | Status: DC
  Administered 2022-04-27 – 2022-05-02 (×6): 30 mg via ORAL
  Filled 2022-04-27 (×6): qty 1

## 2022-04-27 MED ORDER — ACETAMINOPHEN 650 MG RE SUPP: 650.0000 mg | Freq: Four times a day (QID) | RECTAL | Status: DC | PRN

## 2022-04-27 MED ORDER — METHYLPREDNISOLONE SODIUM SUCC 125 MG IJ SOLR
60.0000 mg | Freq: Two times a day (BID) | INTRAMUSCULAR | Status: DC
  Administered 2022-04-27 – 2022-04-28 (×3): 60 mg via INTRAVENOUS
  Filled 2022-04-27 (×3): qty 2

## 2022-04-27 MED ORDER — PROCHLORPERAZINE EDISYLATE 10 MG/2ML IJ SOLN
5.0000 mg | Freq: Four times a day (QID) | INTRAMUSCULAR | Status: DC | PRN
  Administered 2022-05-01: 5 mg via INTRAVENOUS
  Filled 2022-04-27: qty 2

## 2022-04-27 MED ORDER — METHYLPREDNISOLONE SODIUM SUCC 125 MG IJ SOLR: 60.0000 mg | Freq: Two times a day (BID) | INTRAMUSCULAR | Status: DC

## 2022-04-27 MED ORDER — SODIUM CHLORIDE 0.9 % IV SOLN
500.0000 mg | Freq: Once | INTRAVENOUS | Status: AC
  Administered 2022-04-27: 500 mg via INTRAVENOUS
  Filled 2022-04-27: qty 5

## 2022-04-27 MED ORDER — IPRATROPIUM-ALBUTEROL 0.5-2.5 (3) MG/3ML IN SOLN
3.0000 mL | Freq: Four times a day (QID) | RESPIRATORY_TRACT | Status: DC
  Administered 2022-04-27 – 2022-04-28 (×5): 3 mL via RESPIRATORY_TRACT
  Filled 2022-04-27 (×5): qty 3

## 2022-04-27 MED ORDER — DILTIAZEM LOAD VIA INFUSION
15.0000 mg | Freq: Once | INTRAVENOUS | Status: AC
  Administered 2022-04-27: 15 mg via INTRAVENOUS
  Filled 2022-04-27: qty 15

## 2022-04-27 MED ORDER — METHYLPREDNISOLONE SODIUM SUCC 125 MG IJ SOLR
125.0000 mg | Freq: Once | INTRAMUSCULAR | Status: AC
  Administered 2022-04-27: 125 mg via INTRAVENOUS
  Filled 2022-04-27: qty 2

## 2022-04-27 MED ORDER — DILTIAZEM HCL-DEXTROSE 125-5 MG/125ML-% IV SOLN (PREMIX)
5.0000 mg/h | INTRAVENOUS | Status: DC
  Administered 2022-04-27: 7.5 mg/h via INTRAVENOUS
  Administered 2022-04-27: 5 mg/h via INTRAVENOUS
  Administered 2022-04-28: 15 mg/h via INTRAVENOUS
  Filled 2022-04-27 (×3): qty 125

## 2022-04-27 MED ORDER — PREDNISONE 50 MG PO TABS
60.0000 mg | ORAL_TABLET | Freq: Once | ORAL | Status: AC
  Administered 2022-04-27: 60 mg via ORAL
  Filled 2022-04-27: qty 1

## 2022-04-27 MED ORDER — FUROSEMIDE 40 MG PO TABS
80.0000 mg | ORAL_TABLET | Freq: Every day | ORAL | Status: DC
  Administered 2022-04-27 – 2022-04-28 (×2): 80 mg via ORAL
  Filled 2022-04-27 (×2): qty 2

## 2022-04-27 MED ORDER — METOPROLOL TARTRATE 5 MG/5ML IV SOLN
5.0000 mg | Freq: Once | INTRAVENOUS | Status: AC
  Administered 2022-04-27: 5 mg via INTRAVENOUS
  Filled 2022-04-27: qty 5

## 2022-04-27 NOTE — ED Notes (Signed)
Reports received from [place], [place] ED RN to Anselm Pancoast RN

## 2022-04-27 NOTE — H&P (Signed)
History and Physical    Patient: Jason Fisher [id number] DOB: [birth year] DOA: 04/27/2022 DOS: the patient was seen and examined on 04/27/2022 PCP: [name], [name], MD  Patient coming from: Home  Chief Complaint:  Chief Complaint  Patient presents with   Shortness of Breath   HPI: Jason Fisher is a 82 year old male with a history of hypertension, hyperlipidemia, diabetes mellitus type 2, coronary artery disease status post BMS to the RCA 2007 and DES to [place] in August 2017, chronic atrial fibrillation on apixaban, tobacco abuse, CKD stage III, AAA presenting with 2 months of worsening shortness of breath.  The patient states that he is short of breath all the time, but in the last 2 months he has noted increasing dyspnea on exertion and decreased exercise tolerance.  He came to the emergency department on 04/23/2022.  He was given furosemide, IV steroids, and discharged home with a prescription for prednisone.  Apparently he never picked up the prednisone.  Nevertheless, he continues to smoke 3 to 4 cigarettes/day.  His spouse also smokes in the house.  He complains of a chronic cough but denies any hemoptysis.  It is mostly nonproductive.  He denies any worsening lower extremity edema or increasing abdominal girth.  He weighs himself every day.  He states that his weight has been stable for the last 2 months.  He states that his weight usually ranges from 161 to 163 pounds.  He is compliant with his furosemide.  He stated that after his ED visit he had 1 day where his breathing was pretty good, but it worsened once again which caused him to come to the emergency department.  He denies any fevers, chills, headache, neck pain, chest pain, abdominal pain, dysuria, hematuria, hematochezia, melena.  Because of his weight and shortness of breath EMS was activated.  He was noted to have oxygen saturation in the 80s by EMS.  In the ED, the patient was afebrile hemodynamically stable.  He was  tachycardic in the 120s.  Blood pressure was elevated at 170/130.  Oxygen saturation was 93% on 2 L.  WBC 15.8, hemoglobin 12.7, platelets 242,000.  Sodium 143, potassium 3.2, bicarbonate 27, serum creatinine 1.53.  LFTs unremarkable.  WBC 15.8, hemoglobin 12.7, platelets 242,000.  CT chest shows ill-defined nodular opacities in the right lung, the largest 12 x 9 mm and right upper lobe.  There is minimal left pleural effusion.  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  Review of Systems: As mentioned in the history of present illness. All other systems reviewed and are negative. Past Medical History:  Diagnosis Date   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 2007 Inf STEMI s/p Vision BMS  2 to RCA;  b. 2009 ISR Prox RCA Stent-->with DES;  c. 09/2015 PCI: LM nl, LAD 30p/m, D1 90, LCX nl, OM1 small, 80, OM2 90 (2.5x14 Biofreedom stent), OM3 small, RCA 30p ISR, 69m ISR, 63m, 85d, EF 50-55%.   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 On Eliquis (CHA2DS2VASc = 5).  Rate controlled w/ BB and CCB.   Chronic diastolic CHF (congestive heart failure) ([place])    a. 09/2015 Echo: EF 55-60%, mildly dil LA.   CKD (chronic kidney disease), stage III [facility])    they mentioned this to the patient but they  would work on it later after the aneurysm   COLONIC POLYPS, HX OF 11/02/2006   COPD (chronic obstructive pulmonary disease) ([place])    DIABETES MELLITUS, TYPE II 10/29/2006   Diverticulosis    History of tobacco abuse    a. Quit 09/2015.   HYPERLIPIDEMIA 10/29/2006   Hypertension    Hypertensive heart disease with heart failure ([place])    Pneumonia 09/2015   [name] 07/31/2016   STEMI (ST elevation myocardial infarction) [facility]) 2007   [name] 07/31/2016   Past Surgical History:  Procedure Laterality Date   ABDOMINAL AORTIC ENDOVASCULAR STENT GRAFT N/A  07/31/2016   Procedure: ABDOMINAL AORTIC ENDOVASCULAR STENT GRAFT;  Surgeon: Serafina Mitchell, MD;  Location: [place];  Service: Vascular;  Laterality: N/A;   CARDIAC CATHETERIZATION N/A 09/26/2015   Procedure: Left Heart Cath and Coronary Angiography;  Surgeon: Peter M Martinique, MD;  Location: [place] CV LAB;  Service: Cardiovascular;  Laterality: N/A;   CARDIAC CATHETERIZATION N/A 09/26/2015   Procedure: Coronary Stent Intervention;  Surgeon: Peter M Martinique, MD;  Location: [place] CV LAB;  Service: Cardiovascular;  Laterality: N/A;   CATARACT EXTRACTION Bilateral    COLONOSCOPY     CORONARY ANGIOPLASTY WITH STENT PLACEMENT  2007   Inferior STEMI 2007 - RCA PCI with 2 Vision BMS; Abnormal Myoview in 2009 - pRCA ins-stent restenosis & unstented disease - DES PCI Promus 3.0 x 20 mm (3.5 mm)   ENDOVASCULAR REPAIR/STENT GRAFT  07/31/2016   INCISION AND DRAINAGE PERIRECTAL ABSCESS N/A 11/06/2015   Procedure: IRRIGATION AND DEBRIDEMENT PERIRECTAL ABSCESS, POSSIBLE HEMORRHOIDECTOMY;  Surgeon: Coralie Keens, MD;  Location: [place];  Service: General;  Laterality: N/A;   ORIF SHOULDER FRACTURE Right    "fell out of back end of a truck"   Social History:  reports that he has been smoking cigarettes. He has a 12.25 pack-year smoking history. He has been exposed to tobacco smoke. He has never used smokeless tobacco. He reports that he does not drink alcohol and does not use drugs.  Allergies  Allergen Reactions   Contrast Media [Iodinated Contrast Media] Anaphylaxis    Family History  Problem Relation Age of Onset   Heart attack Mother    Diabetes Mother    Heart attack Father    Hypertension Father    Heart attack Brother    Diabetes Brother     Prior to Admission medications   Medication Sig Start Date End Date Taking? Authorizing Provider  apixaban (ELIQUIS) 2.5 MG TABS tablet TAKE ONE TABLET BY MOUTH AT [facility] AND AT BEDTIME 04/27/22  Yes Martinique, Peter M, MD  atorvastatin (LIPITOR) 40  MG tablet TAKE ONE TABLET BY MOUTH ONCE DAILY 04/27/22  Yes [name], [name], MD  diltiazem (CARDIZEM CD) 120 MG 24 hr capsule TAKE ONE CAPSULE BY MOUTH ONCE DAILY 04/27/22  Yes [name], [name], MD  furosemide (LASIX) 80 MG tablet TAKE ONE TABLET BY MOUTH ONCE DAILY 03/26/22  Yes [name], [name], MD  isosorbide mononitrate (IMDUR) 30 MG 24 hr tablet Take 1 tablet (30 mg total) by mouth daily. 10/13/21 10/08/22 Yes Martinique, Peter M, MD  levalbuterol [facility] HFA) 45 MCG/ACT inhaler INHALE 1 TO 2 PUFFS BY MOUTH EVERY 6 HOURS AS NEEDED FOR WHEEZE 08/11/21  Yes Collene Gobble, MD  metFORMIN (GLUCOPHAGE) 1000 MG tablet TAKE ONE TABLET BY MOUTH AT [facility] AND AT BEDTIME 03/26/22  Yes [name], [name], MD  metoprolol tartrate (LOPRESSOR) 100 MG tablet TAKE ONE  TABLET BY MOUTH AT [facility] AND AT BEDTIME 01/27/22  Yes Martinique, Peter M, MD  nitroGLYCERIN (NITROSTAT) 0.4 MG SL tablet Place 1 tablet (0.4 mg total) under the tongue every 5 (five) minutes as needed for chest pain. X 3 doses 06/09/21  Yes [name], [name], MD  olmesartan (BENICAR) 5 MG tablet Take 10 mg by mouth daily. 04/29/21  Yes [provider]  polyethylene glycol (MIRALAX / GLYCOLAX) packet Take 17 g by mouth daily as needed for mild constipation.   Yes [provider]  Tiotropium Bromide-Olodaterol (STIOLTO RESPIMAT) 2.5-2.5 MCG/ACT AERS Inhale 2 puffs into the lungs daily. 01/02/22  Yes Collene Gobble, MD  TRESIBA FLEXTOUCH 100 UNIT/ML FlexTouch Pen Inject 32 units into THE SKIN AT bedtime 04/13/22  Yes [name], [name], MD  ACCU-CHEK AVIVA PLUS test strip CHECK BLOOD SUGAR ONCE DAILY. DX E11.51 06/29/19   [name], [name], MD  Continuous Blood Gluc Receiver (DEXCOM G6 RECEIVER) DEVI CHECK VLOOD SUGAR AS DIRECTED AS DIRECTED 10/23/21   [name], [name], MD  Continuous Blood Gluc Sensor (DEXCOM G6 SENSOR) MISC APPLY 1 SENSOR EVERY 10 DAYS 10/14/21    [name], [name], MD  Continuous Blood Gluc Transmit (DEXCOM G6 TRANSMITTER) MISC APPLY TO SENSOR EVERY 90 DAYS 10/23/21   [name], [name], MD  predniSONE (DELTASONE) 50 MG tablet Take 1 tablet (50 mg total) by mouth daily. Patient not taking: Reported on [id number]   Delora Fuel, MD    Physical Exam: Vitals:   04/27/22 0815 04/27/22 0830 04/27/22 0900 04/27/22 1207  BP: (!) 147/96 (!) 154/121 (!) 170/138   Pulse: (!) 103 (!) 108 (!) 106   Resp: (!) 30 (!) 25 (!) 27   Temp:      TempSrc:    Oral  SpO2: 93% 92% 93%   Weight:    73.4 kg  Height:    5\' 8"  (1.727 m)   GENERAL:  A&O x 3, NAD, well developed, cooperative, follows commands HEENT: [place]/AT, No thrush, No icterus, No oral ulcers Neck:  No neck mass, No meningismus, soft, supple CV: RRR, no S3, no S4, no rub, no JVD Lungs:  bilateral rales.  Bilateral wheeze Abd: soft/NT +BS, nondistended Ext: No edema, no lymphangitis, no cyanosis, no rashes Neuro:  CN II-XII intact, strength 4/5 in RUE, RLE, strength 4/5 LUE, LLE; sensation intact bilateral; no dysmetria; babinski equivocal  Data Reviewed: Data reviewed above in history  Assessment and Plan: Acute respiratory failure with hypoxia -Secondary to pneumonia and COPD exacerbation -stable on 3.5L -Wean oxygen for saturation 88-92%  COPD exacerbation -start Brovana -start pulmicort -start Duonebs -start IV solumedrol  Lobar pneumonia -continue ceftriaxone and azithro -check PCT  Permanent atrial fibrillation with RVR -continue apixaban -continue diltiazem drip -incited by COPD exac and PNA  CKD 3b -baseline creatinine 1.5-1.8  Chronic HFpEF -05/11/20 echo--EF 50-55%, no WMA, trivial MR  CAD -no chest pain -continue statin and metoprolol  History AAA repair -AAA endovascular stent grafting by Dr. Trula Slade in June 2018   Controlled DM2  -hold metformin -novolog sliding scale -check A1C  Essential Hypertension -continue  metoprolol -hold olmesartan to allow BP margin for diltiazem titration -plan to transition back to po dilt when Afib stable  Hyperlipidemia -continue statin  Tobacco abuse -tobacco cessation discussed     Advance Care Planning: FULL  Consults: none  Family Communication: none  Severity of Illness: The appropriate patient status for this patient is INPATIENT. Inpatient  status is judged to be reasonable and necessary in order to provide the required intensity of service to ensure the patient's safety. The patient's presenting symptoms, physical exam findings, and initial radiographic and laboratory data in the context of their chronic comorbidities is felt to place them at high risk for further clinical deterioration. Furthermore, it is not anticipated that the patient will be medically stable for discharge from the hospital within 2 midnights of admission.   * I certify that at the point of admission it is my clinical judgment that the patient will require inpatient hospital care spanning beyond 2 midnights from the point of admission due to high intensity of service, high risk for further deterioration and high frequency of surveillance required.*  Author: Orson Eva, MD 04/27/2022 12:47 PM  For on call review [email or web address].

## 2022-04-27 NOTE — ED Notes (Signed)
   04/27/22 1143  Hand-off documentation  Hand-off Received Received from ED RN, ready to receive patient  Report received from (Full Name) Grose, [place]   Given to [employer]

## 2022-04-27 NOTE — Hospital Course (Signed)
82 year old male with a history of hypertension, hyperlipidemia, diabetes mellitus type 2, coronary artery disease status post BMS to the RCA 2007 and DES to OM2 in August 2017, chronic atrial fibrillation on apixaban, tobacco abuse, CKD stage III, AAA presenting with 2 months of worsening shortness of breath.  The patient states that he is short of breath all the time, but in the last 2 months he has noted increasing dyspnea on exertion and decreased exercise tolerance.  He came to the emergency department on 04/23/2022.  He was given furosemide, IV steroids, and discharged home with a prescription for prednisone.  Apparently he never picked up the prednisone.  Nevertheless, he continues to smoke 3 to 4 cigarettes/day.  His spouse also smokes in the house.  He complains of a chronic cough but denies any hemoptysis.  It is mostly nonproductive.  He denies any worsening lower extremity edema or increasing abdominal girth.  He weighs himself every day.  He states that his weight has been stable for the last 2 months.  He states that his weight usually ranges from 161 to 163 pounds.  He is compliant with his furosemide.  He stated that after his ED visit he had 1 day where his breathing was pretty good, but it worsened once again which caused him to come to the emergency department.  He denies any fevers, chills, headache, neck pain, chest pain, abdominal pain, dysuria, hematuria, hematochezia, melena.  Because of his weight and shortness of breath EMS was activated.  He was noted to have oxygen saturation in the 80s by EMS.  In the ED, the patient was afebrile hemodynamically stable.  He was tachycardic in the 120s.  Blood pressure was elevated at 170/130.  Oxygen saturation was 93% on 2 L.  WBC 15.8, hemoglobin 12.7, platelets 242,000.  Sodium 143, potassium 3.2, bicarbonate 27, serum creatinine 1.53.  LFTs unremarkable.  WBC 15.8, hemoglobin 12.7, platelets 242,000.  CT chest shows ill-defined nodular opacities in  the right lung, the largest 12 x 9 mm and right upper lobe.  There is minimal left pleural effusion.  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

## 2022-04-27 NOTE — ED Triage Notes (Signed)
Pt BIB EMS from home with c/o continued SHOB. Pt was seen 3/21 for the same. Per ems pt had diminished breath sounds, O2 80% on RA. Pt placed on NRB, O2 increased to 99%.    Albuterol neb and 200cc NS with ems  20 L hand

## 2022-04-27 NOTE — Telephone Encounter (Signed)
Prescription refill request for Eliquis received. Indication: AF Last office visit: 11/20/21  P Martinique MD Scr: 1.53 04/27/22  EPIC Age: 82 Weight: 75.3kg  Based on above findings Eliquis 2.5mg  twice daily is the appropriate dose.  Refill approved

## 2022-04-27 NOTE — ED Provider Notes (Signed)
[facility] Provider Note   CSN: [id number] Arrival date & time: 04/27/22  [id number]     History  Chief Complaint  Patient presents with   Shortness of Breath    Jason Fisher is a 82 y.o. male.   Shortness of Breath Patient presents for shortness of breath.  Medical history includes HLD, HTN, CAD, atrial fibrillation, AAA, CHF, CKD, COPD.  He was seen in the ED 3 days ago for shortness of breath.  At that time, he was treated with steroids, DuoNeb, and Lasix.  Today, EMS reports SpO2 of 80% on room air.  He is placed on nonrebreather.  Albuterol nebulizer and 200 cc IVF given prior to arrival.  Patient states that, since his last ED visit, he has had continued and worsening symptoms.  He will feel occasional palpitations.  He will feel lightheadedness and dizziness with standing.  He states that he has been adherent to his home medications, including his Eliquis.  He has not been taking prednisone since his recent ED visit.  He does not have a nebulizer at home.  He does have an inhaler.     Home Medications Prior to Admission medications   Medication Sig Start Date End Date Taking? Authorizing Provider  apixaban (ELIQUIS) 2.5 MG TABS tablet TAKE ONE TABLET BY MOUTH AT [facility] AND AT BEDTIME 04/27/22  Yes Martinique, Peter M, MD  atorvastatin (LIPITOR) 40 MG tablet TAKE ONE TABLET BY MOUTH ONCE DAILY 04/27/22  Yes [name], [name], MD  diltiazem (CARDIZEM CD) 120 MG 24 hr capsule TAKE ONE CAPSULE BY MOUTH ONCE DAILY 04/27/22  Yes [name], [name], MD  furosemide (LASIX) 80 MG tablet TAKE ONE TABLET BY MOUTH ONCE DAILY 03/26/22  Yes [name], [name], MD  isosorbide mononitrate (IMDUR) 30 MG 24 hr tablet Take 1 tablet (30 mg total) by mouth daily. 10/13/21 10/08/22 Yes [place]ique, Peter M, MD  levalbuterol [facility] HFA) 45 MCG/ACT inhaler INHALE 1 TO 2 PUFFS BY MOUTH EVERY 6 HOURS AS NEEDED FOR WHEEZE 08/11/21  Yes Collene Gobble, MD  metFORMIN (GLUCOPHAGE) 1000 MG tablet TAKE ONE TABLET BY MOUTH AT [facility] AND AT BEDTIME 03/26/22  Yes [name], [name], MD  metoprolol tartrate (LOPRESSOR) 100 MG tablet TAKE ONE TABLET BY MOUTH AT [facility] AND AT BEDTIME 01/27/22  Yes [place], Peter M, MD  nitroGLYCERIN (NITROSTAT) 0.4 MG SL tablet Place 1 tablet (0.4 mg total) under the tongue every 5 (five) minutes as needed for chest pain. X 3 doses 06/09/21  Yes [name], [name], MD  olmesartan (BENICAR) 5 MG tablet Take 10 mg by mouth daily. 04/29/21  Yes [provider]  polyethylene glycol (MIRALAX / GLYCOLAX) packet Take 17 g by mouth daily as needed for mild constipation.   Yes [provider]  Tiotropium Bromide-Olodaterol (STIOLTO RESPIMAT) 2.5-2.5 MCG/ACT AERS Inhale 2 puffs into the lungs daily. 01/02/22  Yes Collene Gobble, MD  TRESIBA FLEXTOUCH 100 UNIT/ML FlexTouch Pen Inject 32 units into THE SKIN AT bedtime 04/13/22  Yes [name], [name], MD  ACCU-CHEK AVIVA PLUS test strip CHECK BLOOD SUGAR ONCE DAILY. DX E11.51 06/29/19   [name], [name], MD  Continuous Blood Gluc Receiver ([place]) [place] AS DIRECTED AS DIRECTED 10/23/21   [name], [name], MD  Continuous Blood Gluc Sensor (DEXCOM G6 SENSOR) MISC APPLY 1 SENSOR EVERY 10 DAYS 10/14/21   [name],  [name], MD  Continuous Blood Gluc Transmit (DEXCOM G6 TRANSMITTER) MISC APPLY TO SENSOR EVERY 90 DAYS 10/23/21   [name], [name], MD  predniSONE (DELTASONE) 50 MG tablet Take 1 tablet (50 mg total) by mouth daily. Patient not taking: Reported on [id number]   Delora Fuel, MD      Allergies    Contrast media [iodinated contrast media]    Review of Systems   Review of Systems  Respiratory:  Positive for shortness of breath.   Cardiovascular:  Positive for palpitations.  Neurological:  Positive for dizziness and light-headedness.  All other systems reviewed  and are negative.   Physical Exam Updated Vital Signs BP (!) 170/138   Pulse (!) 106   Temp 98.1 F (36.7 C)   Resp (!) 27   SpO2 93%  Physical Exam Vitals and nursing note reviewed.  Constitutional:      General: He is not in acute distress.    Appearance: He is well-developed. He is not ill-appearing, toxic-appearing or diaphoretic.  HENT:     Head: Normocephalic and atraumatic.     Mouth/Throat:     Mouth: Mucous membranes are moist.  Eyes:     Conjunctiva/sclera: Conjunctivae normal.  Cardiovascular:     Rate and Rhythm: Tachycardia present. Rhythm irregular.     Heart sounds: No murmur heard. Pulmonary:     Effort: Pulmonary effort is normal. No accessory muscle usage or respiratory distress.     Breath sounds: Decreased breath sounds present. No wheezing, rhonchi or rales.     Comments: On 2 L of supplemental oxygen with SpO2 in the low 90s. Chest:     Chest wall: No tenderness.  Abdominal:     Palpations: Abdomen is soft.     Tenderness: There is no abdominal tenderness.  Musculoskeletal:        General: No swelling. Normal range of motion.     Cervical back: Normal range of motion and neck supple.     Right lower leg: No edema.     Left lower leg: No edema.  Skin:    General: Skin is warm and dry.     Coloration: Skin is not cyanotic or pale.  Neurological:     General: No focal deficit present.     Mental Status: He is alert and oriented to person, place, and time.  Psychiatric:        Mood and Affect: Mood normal.        Behavior: Behavior normal.     ED Results / Procedures / Treatments   Labs (all labs ordered are listed, but only abnormal results are displayed) Labs Reviewed  COMPREHENSIVE METABOLIC PANEL - Abnormal; Notable for the following components:      Result Value   Potassium 3.2 (*)    Glucose, Bld 122 (*)    BUN 31 (*)    Creatinine, Ser 1.53 (*)    GFR, Estimated 45 (*)    All other components within normal limits  BRAIN  NATRIURETIC PEPTIDE - Abnormal; Notable for the following components:   B Natriuretic Peptide 747.0 (*)    All other components within normal limits  BLOOD GAS, VENOUS - Abnormal; Notable for the following components:   pO2, Ven <31 (*)    Bicarbonate 34.5 (*)    Acid-Base Excess 7.0 (*)    All other components within normal limits  CBC WITH DIFFERENTIAL/PLATELET - Abnormal; Notable for the following components:   WBC 15.8 (*)  Hemoglobin 12.7 (*)    Neutro Abs 11.4 (*)    Monocytes Absolute 2.0 (*)    Abs Immature Granulocytes 0.09 (*)    All other components within normal limits  TROPONIN I (HIGH SENSITIVITY) - Abnormal; Notable for the following components:   Troponin I (High Sensitivity) 38 (*)    All other components within normal limits  TROPONIN I (HIGH SENSITIVITY) - Abnormal; Notable for the following components:   Troponin I (High Sensitivity) 85 (*)    All other components within normal limits  RESP PANEL BY RT-PCR (RSV, FLU A&B, COVID)  RVPGX2  MAGNESIUM    EKG EKG Interpretation  Date/Time:  Monday April 27 2022 07:05:38 EDT Ventricular Rate:  129 PR Interval:    QRS Duration: 108 QT Interval:  277 QTC Calculation: 406 R Axis:   70 Text Interpretation: Atrial fibrillation Repolarization abnormality, prob rate related Confirmed by Godfrey Pick [phone]) on 04/27/2022 10:15:11 AM  Radiology CT Chest Wo Contrast  Result Date: 04/27/2022 CLINICAL DATA:  Shortness of breath. EXAM: CT CHEST WITHOUT CONTRAST TECHNIQUE: Multidetector CT imaging of the chest was performed following the standard protocol without IV contrast. RADIATION DOSE REDUCTION: This exam was performed according to the departmental dose-optimization program which includes automated exposure control, adjustment of the mA and/or kV according to patient size and/or use of iterative reconstruction technique. COMPARISON:  January 15, 2016. FINDINGS: Cardiovascular: Atherosclerosis of thoracic aorta is noted  without aneurysm formation. Normal cardiac size. No pericardial effusion. Extensive coronary artery calcifications are noted. Mediastinum/Nodes: No enlarged mediastinal or axillary lymph nodes. Thyroid gland, trachea, and esophagus demonstrate no significant findings. Lungs/Pleura: No pneumothorax is noted. Minimal left pleural effusion is noted. There is interval development of ill-defined patchy nodular opacities in the right lung, the largest measuring 12 x 9 mm on image number 67 of series 4 in the right upper lobe. Emphysematous disease is noted bilaterally. Mild bilateral posterior basilar subsegmental atelectasis is noted. 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 Musculoskeletal: No chest wall mass or suspicious bone lesions identified. IMPRESSION: Interval development of ill-defined patchy nodular opacities in the right lung, the largest measuring 12 x 9 mm in the right upper lobe. While this may represent multifocal pneumonia or inflammation, metastatic disease or other neoplasm cannot be excluded. Follow-up chest CT in 2-3 weeks is recommended to ensure resolution or stability of these abnormalities. Minimal left pleural effusion is noted. Hepatic cirrhosis. Extensive coronary artery calcifications are noted suggesting coronary artery disease. 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 Aortic Atherosclerosis (ICD10-I70.0) and Emphysema (ICD10-J43.9). Electronically Signed   By: Marijo Conception M.D.   On: 04/27/2022 09:36   DG Chest Port 1 View  Result Date: 04/27/2022 CLINICAL DATA:  Shortness of breath.  Dyspnea. EXAM: PORTABLE CHEST 1 VIEW COMPARISON:  04/23/2022 FINDINGS: The lungs are clear without focal pneumonia, edema, pneumothorax or pleural effusion. Tiny pulmonary nodule identified right mid lung. Interstitial markings are diffusely coarsened with chronic  features. Cardiopericardial silhouette is at upper limits of normal for size. Bones are diffusely demineralized. Telemetry leads overlie the chest. IMPRESSION: Chronic interstitial coarsening without acute cardiopulmonary findings. Tiny pulmonary nodule identified right mid lung. CT chest without contrast recommended to further evaluate. Electronically Signed   By: Misty Stanley M.D.   On: 04/27/2022 07:58    Procedures Procedures    Medications Ordered in ED Medications  diltiazem (CARDIZEM) 1 mg/mL load via infusion 15  mg (15 mg Intravenous Bolus from Bag 04/27/22 0811)    And  diltiazem (CARDIZEM) 125 mg in dextrose 5% 125 mL (1 mg/mL) infusion (10 mg/hr Intravenous Rate/Dose Change 04/27/22 0940)  cefTRIAXone (ROCEPHIN) 1 g in sodium chloride 0.9 % 100 mL IVPB (1 g Intravenous New Bag/Given 04/27/22 1131)  azithromycin (ZITHROMAX) 500 mg in sodium chloride 0.9 % 250 mL IVPB (has no administration in time range)  potassium chloride SA (KLOR-CON M) CR tablet 40 mEq (has no administration in time range)  methylPREDNISolone sodium succinate (SOLU-MEDROL) 125 mg/2 mL injection 125 mg (has no administration in time range)  predniSONE (DELTASONE) tablet 60 mg (60 mg Oral Given 04/27/22 0805)    ED Course/ Medical Decision Making/ A&P                             Medical Decision Making Amount and/or Complexity of Data Reviewed Labs: ordered. Radiology: ordered.  Risk Prescription drug management. Decision regarding hospitalization.   This patient presents to the ED for concern of shortness of breath, this involves an extensive number of treatment options, and is a complaint that carries with it a high risk of complications and morbidity.  The differential diagnosis includes COPD exacerbation, arrhythmia, URI, pneumonia, CHF exacerbation   Co morbidities that complicate the patient evaluation  HLD, HTN, CAD, atrial fibrillation, AAA, CHF, CKD, COPD   Additional history  obtained:  Additional history obtained from N/A External records from outside source obtained and reviewed including EMR   Lab Tests:  I Ordered, and personally interpreted labs.  The pertinent results include: Leukocytosis is present.  Creatinine is baseline.  There is mild hypokalemia with otherwise normal electrolytes.  BNP is moderately elevated, consistent with prior lab work.  Blood gas shows compensated hypercarbia.  Troponins are mildly elevated.   Imaging Studies ordered:  I ordered imaging studies including chest x-ray, CT chest I independently visualized and interpreted imaging which showed multifocal pneumonia; probable chronic dissection of proximal abdominal aorta I agree with the radiologist interpretation   Cardiac Monitoring: / EKG:  The patient was maintained on a cardiac monitor.  I personally viewed and interpreted the cardiac monitored which showed an underlying rhythm of: Atrial fibrillation  Problem List / ED Course / Critical interventions / Medication management  Patient presents for shortness of breath.  He does not wear oxygen at baseline.  EMS noted SpO2 of 80% on room air.  He arrives on nonrebreather.  Patient was able to be weaned down to 2 L supplemental oxygen by nasal cannula with SpO2 maintaining in the low 90s.  Other vital signs are notable for hypertension, tachypnea and tachycardia.  EKG shows irregularly irregular tachycardia, consistent with A-fib with RVR.  Patient's home medications include Eliquis, diltiazem, metoprolol, Imdur.  He states that he has been adherent to his Eliquis.  On lung auscultation, no wheezing is present but there is some diminished breath sounds.  He has not been taking his prednisone since his recent ED visit.  Dose of Prednisone was ordered.  Diltiazem bolus and gtt. were initiated.  Diagnostic workup was initiated.  Lab work is notable for hypokalemia and leukocytosis.  Imaging studies are notable for multifocal pneumonia,  which explains his new hypoxia.  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  Patient has contrast media allergy.  He will need pretreatment for further evaluation.  On reassessment, patient is on 10 mg  of diltiazem per hour.  Heart rate has improved to the range of 115.  He is currently on 3.5 L of supplemental oxygen.  He denies any new complaints.  Patient was admitted for further management. I ordered medication including diltiazem for rate control; potassium chloride for hypokalemia; antibiotics for pneumonia Reevaluation of the patient after these medicines showed that the patient improved I have reviewed the patients home medicines and have made adjustments as needed   Social Determinants of Health:  Lives independently  CRITICAL CARE Performed by: Godfrey Pick   Total critical care time: 35 minutes  Critical care time was exclusive of separately billable procedures and treating other patients.  Critical care was necessary to treat or prevent imminent or life-threatening deterioration.  Critical care was time spent personally by me on the following activities: development of treatment plan with patient and/or surrogate as well as nursing, discussions with consultants, evaluation of patient's response to treatment, examination of patient, obtaining history from patient or surrogate, ordering and performing treatments and interventions, ordering and review of laboratory studies, ordering and review of radiographic studies, pulse oximetry and re-evaluation of patient's condition.        Final Clinical Impression(s) / ED Diagnoses Final diagnoses:  Acute respiratory failure with hypoxia [facility])  Multifocal pneumonia    Rx / DC Orders ED Discharge Orders     None         Godfrey Pick, MD 04/27/22 1159

## 2022-04-28 DIAGNOSIS — J441 Chronic obstructive pulmonary disease with (acute) exacerbation: Secondary | ICD-10-CM | POA: Diagnosis not present

## 2022-04-28 DIAGNOSIS — J181 Lobar pneumonia, unspecified organism: Secondary | ICD-10-CM | POA: Diagnosis not present

## 2022-04-28 DIAGNOSIS — I4821 Permanent atrial fibrillation: Secondary | ICD-10-CM | POA: Diagnosis not present

## 2022-04-28 DIAGNOSIS — J9601 Acute respiratory failure with hypoxia: Secondary | ICD-10-CM | POA: Diagnosis not present

## 2022-04-28 LAB — FOLATE: Folate: 5.8 ng/mL — ABNORMAL LOW (ref >5.9)

## 2022-04-28 LAB — BASIC METABOLIC PANEL
Anion gap: 13 (ref 5–15)
BUN: 33 mg/dL — ABNORMAL HIGH (ref 8–23)
CO2: 25 mmol/L (ref 22–32)
Calcium: 8.5 mg/dL — ABNORMAL LOW (ref 8.9–10.3)
Chloride: 98 mmol/L (ref 98–111)
Creatinine, Ser: 1.57 mg/dL — ABNORMAL HIGH (ref 0.61–1.24)
GFR, Estimated: 44 mL/min — ABNORMAL LOW (ref >60)
Glucose, Bld: 401 mg/dL — ABNORMAL HIGH (ref 70–99)
Potassium: 3.4 mmol/L — ABNORMAL LOW (ref 3.5–5.1)
Sodium: 136 mmol/L (ref 135–145)

## 2022-04-28 LAB — GLUCOSE, CAPILLARY
Glucose-Capillary: 294 mg/dL — ABNORMAL HIGH (ref 70–99)
Glucose-Capillary: 358 mg/dL — ABNORMAL HIGH (ref 70–99)
Glucose-Capillary: 227 mg/dL — ABNORMAL HIGH (ref 70–99)
Glucose-Capillary: 322 mg/dL — ABNORMAL HIGH (ref 70–99)

## 2022-04-28 LAB — BLOOD GAS, VENOUS
Acid-Base Excess: 5.6 mmol/L — ABNORMAL HIGH (ref 0.0–2.0)
Bicarbonate: 30.6 mmol/L — ABNORMAL HIGH (ref 20.0–28.0)
Drawn by: 7534
O2 Saturation: 86.6 %
Patient temperature: 36.4
pCO2, Ven: 44 mmHg (ref 44–60)
pH, Ven: 7.45 — ABNORMAL HIGH (ref 7.25–7.43)
pO2, Ven: 50 mmHg — ABNORMAL HIGH (ref 32–45)

## 2022-04-28 LAB — PROCALCITONIN: Procalcitonin: <0.1 ng/mL

## 2022-04-28 LAB — CBC
HCT: 32.6 % — ABNORMAL LOW (ref 39.0–52.0)
Hemoglobin: 10.6 g/dL — ABNORMAL LOW (ref 13.0–17.0)
MCH: 29.2 pg (ref 26.0–34.0)
MCHC: 32.5 g/dL (ref 30.0–36.0)
MCV: 89.8 fL (ref 80.0–100.0)
Platelets: 218 10*3/uL (ref 150–400)
RBC: 3.63 MIL/uL — ABNORMAL LOW (ref 4.22–5.81)
RDW: 14.9 % (ref 11.5–15.5)
WBC: 11.7 10*3/uL — ABNORMAL HIGH (ref 4.0–10.5)
nRBC: 0 % (ref 0.0–0.2)

## 2022-04-28 LAB — URINALYSIS, W/ REFLEX TO CULTURE (INFECTION SUSPECTED)
Bacteria, UA: NONE SEEN
Bilirubin Urine: NEGATIVE
Glucose, UA: >=500 mg/dL — AB
Ketones, ur: NEGATIVE mg/dL
Leukocytes,Ua: NEGATIVE
Nitrite: NEGATIVE
Protein, ur: 30 mg/dL — AB
Specific Gravity, Urine: 1.017 (ref 1.005–1.030)
pH: 5 (ref 5.0–8.0)

## 2022-04-28 LAB — MRSA NEXT GEN BY PCR, NASAL: MRSA by PCR Next Gen: NOT DETECTED

## 2022-04-28 LAB — MAGNESIUM: Magnesium: 1.9 mg/dL (ref 1.7–2.4)

## 2022-04-28 LAB — VITAMIN B12: Vitamin B-12: 74 pg/mL — ABNORMAL LOW (ref 180–914)

## 2022-04-28 LAB — PHOSPHORUS: Phosphorus: 3.2 mg/dL (ref 2.5–4.6)

## 2022-04-28 LAB — AMMONIA: Ammonia: 13 umol/L (ref 9–35)

## 2022-04-28 MED ORDER — HALOPERIDOL LACTATE 5 MG/ML IJ SOLN
2.0000 mg | Freq: Once | INTRAMUSCULAR | Status: AC
  Administered 2022-04-28: 2 mg via INTRAMUSCULAR
  Filled 2022-04-28: qty 1

## 2022-04-28 MED ORDER — INSULIN ASPART 100 UNIT/ML IJ SOLN
4.0000 [IU] | Freq: Three times a day (TID) | INTRAMUSCULAR | Status: DC
  Administered 2022-04-28 – 2022-04-29 (×3): 4 [IU] via SUBCUTANEOUS

## 2022-04-28 MED ORDER — GUAIFENESIN-DM 100-10 MG/5ML PO SYRP
5.0000 mL | ORAL_SOLUTION | ORAL | Status: DC | PRN
  Administered 2022-04-28 – 2022-04-29 (×2): 5 mL via ORAL
  Filled 2022-04-28 (×2): qty 5

## 2022-04-28 MED ORDER — INSULIN GLARGINE-YFGN 100 UNIT/ML ~~LOC~~ SOLN
20.0000 [IU] | Freq: Every day | SUBCUTANEOUS | Status: DC
  Administered 2022-04-28: 20 [IU] via SUBCUTANEOUS
  Filled 2022-04-28 (×3): qty 0.2

## 2022-04-28 MED ORDER — FOLIC ACID 1 MG PO TABS
1.0000 mg | ORAL_TABLET | Freq: Every day | ORAL | Status: DC
  Administered 2022-04-28 – 2022-05-02 (×4): 1 mg via ORAL
  Filled 2022-04-28 (×5): qty 1

## 2022-04-28 MED ORDER — DILTIAZEM HCL 30 MG PO TABS
90.0000 mg | ORAL_TABLET | Freq: Four times a day (QID) | ORAL | Status: AC
  Administered 2022-04-28 – 2022-04-29 (×6): 90 mg via ORAL
  Filled 2022-04-28 (×6): qty 1

## 2022-04-28 MED ORDER — CYANOCOBALAMIN 1000 MCG/ML IJ SOLN
1000.0000 ug | Freq: Once | INTRAMUSCULAR | Status: AC
  Administered 2022-04-28: 1000 ug via INTRAMUSCULAR
  Filled 2022-04-28: qty 1

## 2022-04-28 MED ORDER — IPRATROPIUM-ALBUTEROL 0.5-2.5 (3) MG/3ML IN SOLN
3.0000 mL | RESPIRATORY_TRACT | Status: DC | PRN
  Administered 2022-04-29 – 2022-04-30 (×3): 3 mL via RESPIRATORY_TRACT
  Filled 2022-04-28 (×3): qty 3

## 2022-04-28 MED ORDER — BUDESONIDE 0.5 MG/2ML IN SUSP
0.5000 mg | Freq: Two times a day (BID) | RESPIRATORY_TRACT | Status: DC
  Administered 2022-04-28 – 2022-04-30 (×6): 0.5 mg via RESPIRATORY_TRACT
  Filled 2022-04-28 (×7): qty 2

## 2022-04-28 MED ORDER — POTASSIUM CHLORIDE CRYS ER 20 MEQ PO TBCR
40.0000 meq | EXTENDED_RELEASE_TABLET | Freq: Once | ORAL | Status: AC
  Administered 2022-04-28: 40 meq via ORAL
  Filled 2022-04-28: qty 2

## 2022-04-28 MED ORDER — INSULIN ASPART 100 UNIT/ML IJ SOLN
0.0000 [IU] | Freq: Every day | INTRAMUSCULAR | Status: DC
  Administered 2022-04-28: 4 [IU] via SUBCUTANEOUS

## 2022-04-28 MED ORDER — INSULIN ASPART 100 UNIT/ML IJ SOLN
0.0000 [IU] | Freq: Three times a day (TID) | INTRAMUSCULAR | Status: DC
  Administered 2022-04-28: 15 [IU] via SUBCUTANEOUS
  Administered 2022-04-28: 5 [IU] via SUBCUTANEOUS
  Administered 2022-04-29: 11 [IU] via SUBCUTANEOUS
  Administered 2022-04-29: 8 [IU] via SUBCUTANEOUS

## 2022-04-28 MED ORDER — VITAMIN B-12 100 MCG PO TABS
500.0000 ug | ORAL_TABLET | Freq: Every day | ORAL | Status: DC
  Administered 2022-04-29: 500 ug via ORAL
  Filled 2022-04-28: qty 5

## 2022-04-28 MED ORDER — IPRATROPIUM-ALBUTEROL 0.5-2.5 (3) MG/3ML IN SOLN
3.0000 mL | Freq: Three times a day (TID) | RESPIRATORY_TRACT | Status: DC
  Administered 2022-04-29 – 2022-05-02 (×9): 3 mL via RESPIRATORY_TRACT
  Filled 2022-04-28 (×10): qty 3

## 2022-04-28 NOTE — Progress Notes (Signed)
Inpatient Diabetes Program Recommendations  AACE/ADA: New Consensus Statement on Inpatient Glycemic Control (2015)  Target Ranges:  Prepandial:   less than 140 mg/dL      Peak postprandial:   less than 180 mg/dL (1-2 hours)      Critically ill patients:  140 - 180 mg/dL   Lab Results  Component Value Date   GLUCAP 294 (H) 04/28/2022   HGBA1C 6.3 (A) 11/20/2021    Review of Glycemic Control  Latest Reference Range & Units 04/27/22 16:45 04/27/22 21:31 04/28/22 07:46  Glucose-Capillary 70 - 99 mg/dL 342 (H) 325 (H) 294 (H)   Diabetes history: DM 2 Outpatient Diabetes medications:  Tresiba 32 units daily Metformin 1000 mg bid Dexcom CGM Current orders for Inpatient glycemic control:  Novolog 0-15 units tid with meals and HS Novolog 4 units tid with meals Solumedrol 60 mg IV q 12 hours Inpatient Diabetes Program Recommendations:   Please add Semglee 30 units daily (patient takes [place] daily).   Thanks,  Adah Perl, RN, BC-ADM Inpatient Diabetes Coordinator Pager [phone]  (8a-5p)

## 2022-04-28 NOTE — Progress Notes (Addendum)
PROGRESS NOTE  Jason Fisher [id number] DOB: [birth year] DOA: 04/27/2022 PCP: [name], [name], MD  Brief History:  82 year old male with a history of hypertension, hyperlipidemia, diabetes mellitus type 2, coronary artery disease status post BMS to the RCA 2007 and DES to OM2 in August 2017, chronic atrial fibrillation on apixaban, tobacco abuse, CKD stage III, AAA presenting with 2 months of worsening shortness of breath.  The patient states that he is short of breath all the time, but in the last 2 months he has noted increasing dyspnea on exertion and decreased exercise tolerance.  He came to the emergency department on 04/23/2022.  He was given furosemide, IV steroids, and discharged home with a prescription for prednisone.  Apparently he never picked up the prednisone.  Nevertheless, he continues to smoke 3 to 4 cigarettes/day.  His spouse also smokes in the house.  He complains of a chronic cough but denies any hemoptysis.  It is mostly nonproductive.  He denies any worsening lower extremity edema or increasing abdominal girth.  He weighs himself every day.  He states that his weight has been stable for the last 2 months.  He states that his weight usually ranges from 161 to 163 pounds.  He is compliant with his furosemide.  He stated that after his ED visit he had 1 day where his breathing was pretty good, but it worsened once again which caused him to come to the emergency department.  He denies any fevers, chills, headache, neck pain, chest pain, abdominal pain, dysuria, hematuria, hematochezia, melena.  Because of his weight and shortness of breath EMS was activated.  He was noted to have oxygen saturation in the 80s by EMS.  In the ED, the patient was afebrile hemodynamically stable.  He was tachycardic in the 120s.  Blood pressure was elevated at 170/130.  Oxygen saturation was 93% on 2 L.  WBC 15.8, hemoglobin 12.7, platelets 242,000.  Sodium 143, potassium 3.2,  bicarbonate 27, serum creatinine 1.53.  LFTs unremarkable.  WBC 15.8, hemoglobin 12.7, platelets 242,000.  CT chest shows ill-defined nodular opacities in the right lung, the largest 12 x 9 mm and right upper lobe.  There is minimal left pleural effusion.  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   Assessment/Plan:  Acute respiratory failure with hypoxia -Secondary to pneumonia and COPD exacerbation -stable on 3.L -Wean oxygen for saturation 88-92% -3/25 CT chest as discussed above -3/26 VBG--7.45/44/50/30   COPD exacerbation -continue Brovana -start pulmicort -continue Duonebs -continue IV solumedrol   Lobar pneumonia -continue ceftriaxone and doxy -check PCT <0.10   Permanent atrial fibrillation with RVR -continue apixaban -continue diltiazem drip>>transition to po diltiazem -incited by COPD exac and PNA   CKD 3b -baseline creatinine 1.5-1.8   Chronic HFpEF -05/11/20 echo--EF 50-55%, no WMA, trivial MR -clinically euvolemic presently   CAD -no chest pain -continue statin and metoprolol   History AAA repair -AAA endovascular stent grafting by Dr. Trula Slade in June 2018    Controlled DM2  -hold metformin -novolog sliding scale>>increase to moderate scale -elevated CBGs due to steroids -add semglee -add novolog 3 units TIW -check A1C   Essential Hypertension -continue metoprolol -hold olmesartan to allow BP margin for diltiazem titration -plan to transition back to po dilt when Afib stable   Hyperlipidemia -continue statin   Tobacco abuse -tobacco cessation discussed  Hypokalemia -replete -mag 1.9  Hospital delirium -  multifactorial including steroids, acute medical condition, low B12 -B12--74>>replete -folate--5.8>>replete -UA neg for pyuria -ammonia 13    Family Communication:   spouse 3/26  Consultants:  none  Code Status:  FULL  DVT Prophylaxis:  apixaban   Procedures: As Listed in  Progress Note Above  Antibiotics: Ceftriaxone 3/25>> Doxy 3/25>>      Subjective: Breathing better today.  Denies cp, sob, abd pain, f/c, n/v/d, headache  Objective: Vitals:   04/28/22 1030 04/28/22 1045 04/28/22 1100 04/28/22 1115  BP: (!) 177/86  (!) 187/103   Pulse: (!) 121 96 (!) 138 (!) 103  Resp: 15 (!) 21 (!) 32 (!) 23  Temp:      TempSrc:      SpO2: 98% 98% 100% 94%  Weight:      Height:        Intake/Output Summary (Last 24 hours) at 04/28/2022 1231 Last data filed at 04/28/2022 0800 Gross per 24 hour  Intake 717.16 ml  Output --  Net 717.16 ml   Weight change:  Exam:  General:  Pt is alert, follows commands appropriately, not in acute distress HEENT: No icterus, No thrush, No neck mass, [place]/AT Cardiovascular: IRRR, S1/S2, no rubs, no gallops Respiratory: diminished BS. Bilateral rales.  Basilar wheeze Abdomen: Soft/+BS, non tender, non distended, no guarding Extremities: No edema, No lymphangitis, No petechiae, No rashes, no synovitis   Data Reviewed: I have personally reviewed following labs and imaging studies Basic Metabolic Panel: Recent Labs  Lab 04/23/22 2145 04/27/22 0738 04/28/22 0432 04/28/22 0901  NA 139 143  --  136  K 3.6 3.2*  --  3.4*  CL 99 102  --  98  CO2 29 27  --  25  GLUCOSE 220* 122*  --  401*  BUN 24* 31*  --  33*  CREATININE 1.80* 1.53*  --  1.57*  CALCIUM 9.5 9.2  --  8.5*  MG  --  1.9 1.9  --   PHOS  --   --  3.2  --    Liver Function Tests: Recent Labs  Lab 04/27/22 0738  AST 24  ALT 20  ALKPHOS 71  BILITOT 0.8  PROT 8.1  ALBUMIN 3.8   No results for input(s): "LIPASE", "AMYLASE" in the last 168 hours. Recent Labs  Lab 04/28/22 0901  AMMONIA 13   Coagulation Profile: No results for input(s): "INR", "PROTIME" in the last 168 hours. CBC: Recent Labs  Lab 04/23/22 2145 04/27/22 0738 04/28/22 0432  WBC 10.0 15.8* 11.7*  NEUTROABS  --  11.4*  --   HGB 12.5* 12.7* 10.6*  HCT 39.1 39.1 32.6*  MCV  91.1 91.1 89.8  PLT 207 242 218   Cardiac Enzymes: No results for input(s): "CKTOTAL", "CKMB", "CKMBINDEX", "TROPONINI" in the last 168 hours. BNP: Invalid input(s): "POCBNP" CBG: Recent Labs  Lab 04/27/22 1645 04/27/22 2131 04/28/22 0746 04/28/22 1200  GLUCAP 342* 325* 294* 358*   HbA1C: No results for input(s): "HGBA1C" in the last 72 hours. Urine analysis:    Component Value Date/Time   COLORURINE YELLOW 04/28/2022 1057   APPEARANCEUR CLEAR 04/28/2022 1057   LABSPEC 1.017 04/28/2022 1057   PHURINE 5.0 04/28/2022 1057   GLUCOSEU >=500 (A) 04/28/2022 1057   HGBUR SMALL (A) 04/28/2022 1057   HGBUR trace-intact 02/16/2007 1038   BILIRUBINUR NEGATIVE 04/28/2022 1057   BILIRUBINUR n 08/30/2015 1045   KETONESUR NEGATIVE 04/28/2022 1057   PROTEINUR 30 (A) 04/28/2022 1057   UROBILINOGEN 1.0 08/30/2015 1045  UROBILINOGEN 1.0 01/14/2010 0629   NITRITE NEGATIVE 04/28/2022 1057   LEUKOCYTESUR NEGATIVE 04/28/2022 1057   Sepsis Labs: @LABRCNTIP (procalcitonin:4,lacticidven:4) ) Recent Results (from the past 240 hour(s))  Resp panel by RT-PCR (RSV, Flu A&B, Covid) Anterior Nasal Swab     Status: None   Collection Time: 04/27/22  7:03 AM   Specimen: Anterior Nasal Swab  Result Value Ref Range Status   SARS Coronavirus 2 by RT PCR NEGATIVE NEGATIVE Final    Comment: (NOTE) SARS-CoV-2 target nucleic acids are NOT DETECTED.  The SARS-CoV-2 RNA is generally detectable in upper respiratory specimens during the acute phase of infection. The lowest concentration of SARS-CoV-2 viral copies this assay can detect is 138 copies/mL. A negative result does not preclude SARS-Cov-2 infection and should not be used as the sole basis for treatment or other patient management decisions. A negative result may occur with  improper specimen collection/handling, submission of specimen other than nasopharyngeal swab, presence of viral mutation(s) within the areas targeted by this assay, and  inadequate number of viral copies(<138 copies/mL). A negative result must be combined with clinical observations, patient history, and epidemiological information. The expected result is Negative.  Fact Sheet for Patients:  EntrepreneurPulse.com.au  Fact Sheet for Healthcare Providers:  IncredibleEmployment.be  This test is no t yet approved or cleared by the [place] FDA and  has been authorized for detection and/or diagnosis of SARS-CoV-2 by FDA under an Emergency Use Authorization (EUA). This EUA will remain  in effect (meaning this test can be used) for the duration of the COVID-19 declaration under Section 564(b)(1) of the Act, 21 U.S.C.section 360bbb-3(b)(1), unless the authorization is terminated  or revoked sooner.       Influenza A by PCR NEGATIVE NEGATIVE Final   Influenza B by PCR NEGATIVE NEGATIVE Final    Comment: (NOTE) The Xpert Xpress SARS-CoV-2/FLU/RSV plus assay is intended as an aid in the diagnosis of influenza from Nasopharyngeal swab specimens and should not be used as a sole basis for treatment. Nasal washings and aspirates are unacceptable for Xpert Xpress SARS-CoV-2/FLU/RSV testing.  Fact Sheet for Patients: EntrepreneurPulse.com.au  Fact Sheet for Healthcare Providers: IncredibleEmployment.be  This test is not yet approved or cleared by the [place] FDA and has been authorized for detection and/or diagnosis of SARS-CoV-2 by FDA under an Emergency Use Authorization (EUA). This EUA will remain in effect (meaning this test can be used) for the duration of the COVID-19 declaration under Section 564(b)(1) of the Act, 21 U.S.C. section 360bbb-3(b)(1), unless the authorization is terminated or revoked.     Resp Syncytial Virus by PCR NEGATIVE NEGATIVE Final    Comment: (NOTE) Fact Sheet for Patients: EntrepreneurPulse.com.au  Fact Sheet for Healthcare  Providers: IncredibleEmployment.be  This test is not yet approved or cleared by the [place] FDA and has been authorized for detection and/or diagnosis of SARS-CoV-2 by FDA under an Emergency Use Authorization (EUA). This EUA will remain in effect (meaning this test can be used) for the duration of the COVID-19 declaration under Section 564(b)(1) of the Act, 21 U.S.C. section 360bbb-3(b)(1), unless the authorization is terminated or revoked.  Performed at [facility], [address] [place], [place] [address]   MRSA Next Gen by PCR, Nasal     Status: None   Collection Time: 04/27/22 12:27 PM   Specimen: Nasal Mucosa; Nasal Swab  Result Value Ref Range Status   MRSA by PCR Next Gen NOT DETECTED NOT DETECTED Final    Comment: (NOTE) The GeneXpert  MRSA Assay (FDA approved for NASAL specimens only), is one component of a comprehensive MRSA colonization surveillance program. It is not intended to diagnose MRSA infection nor to guide or monitor treatment for MRSA infections. Test performance is not FDA approved in patients less than 46 years old. Performed at [facility], [address] [place], [place] [address]      Scheduled Meds:  apixaban  2.5 mg Oral BID   arformoterol  15 mcg Nebulization BID   atorvastatin  40 mg Oral Daily   Chlorhexidine Gluconate Cloth  6 each Topical Daily   diltiazem  90 mg Oral Q6H   doxycycline  100 mg Oral Q12H   furosemide  80 mg Oral Daily   insulin aspart  0-15 Units Subcutaneous TID WC   insulin aspart  0-5 Units Subcutaneous QHS   insulin aspart  4 Units Subcutaneous TID WC   insulin glargine-yfgn  20 Units Subcutaneous Daily   ipratropium-albuterol  3 mL Nebulization Q6H   isosorbide mononitrate  30 mg Oral Daily   methylPREDNISolone (SOLU-MEDROL) injection  60 mg Intravenous Q12H   metoprolol tartrate  100 mg Oral BID   Continuous Infusions:  cefTRIAXone (ROCEPHIN)  IV 1 g (04/28/22 1032)     Procedures/Studies: CT Chest Wo Contrast  Result Date: 04/27/2022 CLINICAL DATA:  Shortness of breath. EXAM: CT CHEST WITHOUT CONTRAST TECHNIQUE: Multidetector CT imaging of the chest was performed following the standard protocol without IV contrast. RADIATION DOSE REDUCTION: This exam was performed according to the departmental dose-optimization program which includes automated exposure control, adjustment of the mA and/or kV according to patient size and/or use of iterative reconstruction technique. COMPARISON:  January 15, 2016. FINDINGS: Cardiovascular: Atherosclerosis of thoracic aorta is noted without aneurysm formation. Normal cardiac size. No pericardial effusion. Extensive coronary artery calcifications are noted. Mediastinum/Nodes: No enlarged mediastinal or axillary lymph nodes. Thyroid gland, trachea, and esophagus demonstrate no significant findings. Lungs/Pleura: No pneumothorax is noted. Minimal left pleural effusion is noted. There is interval development of ill-defined patchy nodular opacities in the right lung, the largest measuring 12 x 9 mm on image number 67 of series 4 in the right upper lobe. Emphysematous disease is noted bilaterally. Mild bilateral posterior basilar subsegmental atelectasis is noted. 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 Musculoskeletal: No chest wall mass or suspicious bone lesions identified. IMPRESSION: Interval development of ill-defined patchy nodular opacities in the right lung, the largest measuring 12 x 9 mm in the right upper lobe. While this may represent multifocal pneumonia or inflammation, metastatic disease or other neoplasm cannot be excluded. Follow-up chest CT in 2-3 weeks is recommended to ensure resolution or stability of these abnormalities. Minimal left pleural effusion is noted. Hepatic cirrhosis. Extensive coronary artery calcifications are noted suggesting  coronary artery disease. 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 Aortic Atherosclerosis (ICD10-I70.0) and Emphysema (ICD10-J43.9). Electronically Signed   By: Marijo Conception M.D.   On: 04/27/2022 09:36   DG Chest Port 1 View  Result Date: 04/27/2022 CLINICAL DATA:  Shortness of breath.  Dyspnea. EXAM: PORTABLE CHEST 1 VIEW COMPARISON:  04/23/2022 FINDINGS: The lungs are clear without focal pneumonia, edema, pneumothorax or pleural effusion. Tiny pulmonary nodule identified right mid lung. Interstitial markings are diffusely coarsened with chronic features. Cardiopericardial silhouette is at upper limits of normal for size. Bones are diffusely demineralized. Telemetry leads overlie the chest. IMPRESSION: Chronic interstitial coarsening without acute cardiopulmonary findings. Tiny  pulmonary nodule identified right mid lung. CT chest without contrast recommended to further evaluate. Electronically Signed   By: Misty Stanley M.D.   On: 04/27/2022 07:58   DG Chest 2 View  Result Date: 04/23/2022 CLINICAL DATA:  Shortness of breath EXAM: CHEST - 2 VIEW COMPARISON:  05/10/2020, CT 01/15/2016 FINDINGS: Hyperinflation with emphysema. Trace pleural effusions versus pleural scarring. Normal cardiac size. Aortic atherosclerosis. No acute airspace disease IMPRESSION: Hyperinflation with emphysema. Trace pleural effusions versus pleural scarring. Electronically Signed   By: Donavan Foil M.D.   On: 04/23/2022 21:43   VAS [place] EVAR DUPLEX  Result Date: 04/07/2022 Endovascular Aortic Repair Study (EVAR) Patient Name:  TILER VAZIRI  Date of Exam:   04/07/2022 Medical Rec #: [id number]       Accession #:    [id number] Date of Birth: March 29, 1940       Patient Gender: M Patient Age:   64 years Exam Location:  [facility] Procedure:      VAS [place] EVAR DUPLEX Referring Phys: Karoline Caldwell  --------------------------------------------------------------------------------  Indications: Follow up exam for EVAR. Surgery date 07/31/16. Risk Factors: Hypertension, current smoker, prior MI, coronary artery disease. Other Factors: CHF, Hx AAA, Carotid AD. Limitations: Air/bowel gas and obesity.  Comparison Study: 07/31/16 EVAR Performing Technologist: Elta Guadeloupe RVT, RDMS  Examination Guidelines: A complete evaluation includes B-mode imaging, spectral Doppler, color Doppler, and power Doppler as needed of all accessible portions of each vessel. Bilateral testing is considered an integral part of a complete examination. Limited examinations for reoccurring indications may be performed as noted.  Endovascular Aortic Repair (EVAR): +----------+----------------+-------------------+-------------------+           Diameter AP (cm)Diameter Trans (cm)Velocities (cm/sec) +----------+----------------+-------------------+-------------------+ Aorta     3.81            4.34               25                  +----------+----------------+-------------------+-------------------+ Right Limb1.44                               41                  +----------+----------------+-------------------+-------------------+ Left Limb 1.50                               43                  +----------+----------------+-------------------+-------------------+  Summary: Abdominal Aorta: Patent endovascular aneurysm repair with no evidence of endoleak.  *See table(s) above for measurements and observations.  Electronically signed by Monica Martinez MD on 04/07/2022 at 10:00:28 AM.    Final     Orson Eva, DO  [facility]  If 7PM-7AM, please contact night-coverage [email or web address] Password [facility] 04/28/2022, 12:31 PM   LOS: 1 day

## 2022-04-28 NOTE — TOC Initial Note (Addendum)
Transition of Care [facility]) - Initial/Assessment Note    Patient Details  Name: Jason Fisher MRN: [id number] Date of Birth: Apr 21, 1940  [facility] CM/SW Contact:    Boneta Lucks, RN Phone Number: 04/28/2022, 2:24 PM  Clinical Narrative:    Patient admited with acute respiratory failure with hypoxia and hypercarbia. CM spoke with patient. He lives at home with his wife, states he does not drive and uses a can or scooter. RN came on the line to update CM that patient had been more confused today. He is unsure if patient is answering question appropriately. CM could not reach his wife. TOC following.                Expected Discharge Plan: [place] Barriers to Discharge: Continued Medical Work up   Patient Goals and CMS Choice Patient states their goals for this hospitalization and ongoing recovery are:: to get better CMS Medicare.gov Compare Post Acute Care list provided to:: Patient Choice offered to / list presented to : Patient     Expected Discharge Plan and Services      Living arrangements for the past 2 months: Single Family Home          Prior Living Arrangements/Services Living arrangements for the past 2 months: [place] Lives with:: Spouse Patient language and need for interpreter reviewed:: Yes        Need for Family Participation in Patient Care: Yes (Comment) Care giver support system in place?: Yes (comment) Current home services: DME Criminal Activity/Legal Involvement Pertinent to Current Situation/Hospitalization: No - Comment as needed  Activities of Daily Living Home Assistive Devices/Equipment: None ADL Screening (condition at time of admission) Patient's cognitive ability adequate to safely complete daily activities?: Yes Is the patient deaf or have difficulty hearing?: No Does the patient have difficulty seeing, even when wearing glasses/contacts?: No Does the patient have difficulty concentrating,  remembering, or making decisions?: No Patient able to express need for assistance with ADLs?: No Does the patient have difficulty dressing or bathing?: No Independently performs ADLs?: Yes (appropriate for developmental age) Does the patient have difficulty walking or climbing stairs?: No Weakness of Legs: None Weakness of Arms/Hands: None  Permission Sought/Granted    Emotional Assessment       Orientation: : Oriented to Self, Oriented to Situation Alcohol / Substance Use: Not Applicable Psych Involvement: No (comment)  Admission diagnosis:  Acute respiratory failure with hypoxia (HCC) [J96.01] Acute respiratory failure with hypoxia and hypercarbia (HCC) [J96.01, J96.02] Multifocal pneumonia [J18.9] Patient Active Problem List   Diagnosis Date Noted   Acute respiratory failure with hypoxia and hypercarbia (HCC) 04/27/2022   Permanent atrial fibrillation ([place]) 04/27/2022   Lobar pneumonia ([place]) 04/27/2022   Acute exacerbation of CHF (congestive heart failure) ([place]) 05/11/2020   Hyperglycemia due to diabetes mellitus ([place]) 05/11/2020   Elevated brain natriuretic peptide (BNP) level 05/11/2020   Hypertensive urgency 05/11/2020   Prolonged QT interval 05/11/2020   Elevated troponin 04/22/2016   COPD with acute exacerbation ([place]) 02/07/2016   Abdominal aortic aneurysm (AAA) without rupture ([place])    Perirectal abscess 11/04/2015   Pulmonary nodules 11/04/2015   Coronary artery disease of bypass graft of native heart with stable angina pectoris ([place])    Allergic reaction to contrast dye 09/27/2015   Accelerated hypertension with diastolic congestive heart failure, NYHA class 3 ([place]) 09/27/2015   CHF (congestive heart failure) ([place]) 09/25/2015   CKD (chronic kidney disease), stage III ([place])  Acute on chronic combined systolic and diastolic congestive heart failure (HCC)    Diastolic congestive heart failure (HCC)    Paroxysmal atrial fibrillation ([place])    Angiodysplasia of  colon 04/19/2015   TOBACCO USER 04/17/2010   Chronic atrial fibrillation ([place]): CHA2DS2Vasc = 5. On Warfarin. Rate control 11/06/2009   Occlusion and stenosis of carotid artery without mention of cerebral infarction 07/13/2008   Abdominal aortic aneurysm ([place]) 07/13/2008   BENIGN PROSTATIC HYPERTROPHY, HX OF 02/24/2007   MYOCARDIAL INFARCTION, HX OF 11/02/2006   CAD S/P percutaneous coronary angioplasty 11/02/2006   History of colonic polyps 11/02/2006   Dyslipidemia 10/29/2006   Essential hypertension 10/29/2006   PCP:  [name], [name], MD Pharmacy:   [id number] - [place], [place] - [address] [place] [address] Phone: [phone] Fax: [phone]  [facility] - [place], [place] - [address] [place] [address] Phone: [phone] Fax: [place], [place]. [place] [address] Phone: [phone] Fax: [phone]     Social Determinants of Health (SDOH) Social History: SDOH Screenings   Food Insecurity: No Food Insecurity (04/27/2022)  Housing: Low Risk  (04/27/2022)  Transportation Needs: No Transportation Needs (04/27/2022)  Utilities: Not At Risk (04/27/2022)  Depression (PHQ2-9): Low Risk  (01/21/2022)  Financial Resource Strain: Medium Risk (09/23/2020)  Physical Activity: Sufficiently Active (06/08/2019)  Tobacco Use: High Risk (04/27/2022)   SDOH Interventions:    Readmission Risk Interventions    04/28/2022    2:20 PM  Readmission Risk Prevention Plan  Transportation Screening Complete  PCP or Specialist Appt within 3-5 Days Not Complete  HRI or [place] Complete  Social Work Consult for [place] Planning/Counseling Complete  Medication Review Press photographer) Complete

## 2022-04-28 NOTE — Progress Notes (Signed)
Pt extremely confused, attempting to get out of bed and leave the hospital, states that this is his "land" and no one can keep him her, he thinks we are stealing his land.

## 2022-04-28 NOTE — Progress Notes (Signed)
Family present, able to calm pt down Haldol given as well as routine meds, pt resting VSS.

## 2022-04-29 ENCOUNTER — Inpatient Hospital Stay (HOSPITAL_COMMUNITY): Payer: Medicare Other

## 2022-04-29 DIAGNOSIS — J441 Chronic obstructive pulmonary disease with (acute) exacerbation: Secondary | ICD-10-CM | POA: Diagnosis not present

## 2022-04-29 DIAGNOSIS — E538 Deficiency of other specified B group vitamins: Secondary | ICD-10-CM | POA: Diagnosis present

## 2022-04-29 DIAGNOSIS — J181 Lobar pneumonia, unspecified organism: Secondary | ICD-10-CM | POA: Diagnosis not present

## 2022-04-29 DIAGNOSIS — I4821 Permanent atrial fibrillation: Secondary | ICD-10-CM | POA: Diagnosis not present

## 2022-04-29 DIAGNOSIS — J9601 Acute respiratory failure with hypoxia: Secondary | ICD-10-CM | POA: Diagnosis not present

## 2022-04-29 LAB — BASIC METABOLIC PANEL
Anion gap: 9 (ref 5–15)
BUN: 50 mg/dL — ABNORMAL HIGH (ref 8–23)
CO2: 26 mmol/L (ref 22–32)
Calcium: 8.6 mg/dL — ABNORMAL LOW (ref 8.9–10.3)
Chloride: 101 mmol/L (ref 98–111)
Creatinine, Ser: 1.85 mg/dL — ABNORMAL HIGH (ref 0.61–1.24)
GFR, Estimated: 36 mL/min — ABNORMAL LOW (ref >60)
Glucose, Bld: 262 mg/dL — ABNORMAL HIGH (ref 70–99)
Potassium: 3.4 mmol/L — ABNORMAL LOW (ref 3.5–5.1)
Sodium: 136 mmol/L (ref 135–145)

## 2022-04-29 LAB — GLUCOSE, CAPILLARY
Glucose-Capillary: 282 mg/dL — ABNORMAL HIGH (ref 70–99)
Glucose-Capillary: 347 mg/dL — ABNORMAL HIGH (ref 70–99)
Glucose-Capillary: 133 mg/dL — ABNORMAL HIGH (ref 70–99)
Glucose-Capillary: 224 mg/dL — ABNORMAL HIGH (ref 70–99)

## 2022-04-29 MED ORDER — PREDNISONE 20 MG PO TABS
40.0000 mg | ORAL_TABLET | Freq: Once | ORAL | Status: AC
  Administered 2022-04-29: 40 mg via ORAL
  Filled 2022-04-29: qty 2

## 2022-04-29 MED ORDER — METHYLPREDNISOLONE SODIUM SUCC 40 MG IJ SOLR
40.0000 mg | Freq: Two times a day (BID) | INTRAMUSCULAR | Status: DC
  Administered 2022-04-29: 40 mg via INTRAVENOUS
  Filled 2022-04-29: qty 1

## 2022-04-29 MED ORDER — INSULIN ASPART 100 UNIT/ML IJ SOLN
0.0000 [IU] | Freq: Every day | INTRAMUSCULAR | Status: DC
  Administered 2022-04-29 – 2022-05-01 (×2): 2 [IU] via SUBCUTANEOUS

## 2022-04-29 MED ORDER — MELATONIN 3 MG PO TABS
6.0000 mg | ORAL_TABLET | Freq: Every day | ORAL | Status: DC
  Administered 2022-04-29 – 2022-05-01 (×2): 6 mg via ORAL
  Filled 2022-04-29 (×3): qty 2

## 2022-04-29 MED ORDER — POTASSIUM CHLORIDE CRYS ER 20 MEQ PO TBCR
40.0000 meq | EXTENDED_RELEASE_TABLET | Freq: Once | ORAL | Status: AC
  Administered 2022-04-29: 40 meq via ORAL
  Filled 2022-04-29: qty 2

## 2022-04-29 MED ORDER — INSULIN GLARGINE-YFGN 100 UNIT/ML ~~LOC~~ SOLN
30.0000 [IU] | Freq: Every day | SUBCUTANEOUS | Status: DC
  Administered 2022-04-29 – 2022-05-01 (×2): 30 [IU] via SUBCUTANEOUS
  Filled 2022-04-29 (×5): qty 0.3

## 2022-04-29 MED ORDER — HYDRALAZINE HCL 20 MG/ML IJ SOLN
10.0000 mg | Freq: Four times a day (QID) | INTRAMUSCULAR | Status: DC | PRN
  Administered 2022-04-29: 10 mg via INTRAVENOUS
  Filled 2022-04-29: qty 1

## 2022-04-29 MED ORDER — INSULIN ASPART 100 UNIT/ML IJ SOLN
0.0000 [IU] | Freq: Three times a day (TID) | INTRAMUSCULAR | Status: DC
  Administered 2022-04-29: 3 [IU] via SUBCUTANEOUS
  Administered 2022-04-30: 7 [IU] via SUBCUTANEOUS
  Administered 2022-04-30: 4 [IU] via SUBCUTANEOUS
  Administered 2022-05-01 (×2): 3 [IU] via SUBCUTANEOUS
  Administered 2022-05-02: 4 [IU] via SUBCUTANEOUS
  Administered 2022-05-02: 7 [IU] via SUBCUTANEOUS

## 2022-04-29 MED ORDER — DILTIAZEM HCL ER COATED BEADS 120 MG PO CP24
360.0000 mg | ORAL_CAPSULE | Freq: Every day | ORAL | Status: DC
  Administered 2022-04-30 – 2022-05-02 (×3): 360 mg via ORAL
  Filled 2022-04-29 (×3): qty 3

## 2022-04-29 MED ORDER — POTASSIUM CHLORIDE CRYS ER 20 MEQ PO TBCR
40.0000 meq | EXTENDED_RELEASE_TABLET | Freq: Every day | ORAL | Status: DC
  Administered 2022-04-30 – 2022-05-02 (×3): 40 meq via ORAL
  Filled 2022-04-29 (×3): qty 2

## 2022-04-29 MED ORDER — HYDRALAZINE HCL 25 MG PO TABS: 25.0000 mg | ORAL_TABLET | Freq: Three times a day (TID) | ORAL | Status: DC | PRN

## 2022-04-29 MED ORDER — INSULIN ASPART 100 UNIT/ML IJ SOLN
10.0000 [IU] | Freq: Three times a day (TID) | INTRAMUSCULAR | Status: DC
  Administered 2022-04-29: 6 [IU] via SUBCUTANEOUS
  Administered 2022-04-29: 10 [IU] via SUBCUTANEOUS

## 2022-04-29 MED ORDER — FUROSEMIDE 40 MG PO TABS
80.0000 mg | ORAL_TABLET | Freq: Every day | ORAL | Status: DC
  Administered 2022-04-30: 80 mg via ORAL
  Filled 2022-04-29 (×2): qty 2

## 2022-04-29 MED ORDER — INSULIN ASPART 100 UNIT/ML IJ SOLN: 6.0000 [IU] | Freq: Three times a day (TID) | INTRAMUSCULAR | Status: DC

## 2022-04-29 MED ORDER — PREDNISONE 20 MG PO TABS
40.0000 mg | ORAL_TABLET | Freq: Every day | ORAL | Status: DC
  Filled 2022-04-29: qty 2

## 2022-04-29 MED ORDER — GUAIFENESIN ER 600 MG PO TB12
600.0000 mg | ORAL_TABLET | Freq: Two times a day (BID) | ORAL | Status: DC
  Administered 2022-04-29 – 2022-05-02 (×5): 600 mg via ORAL
  Filled 2022-04-29 (×6): qty 1

## 2022-04-29 MED ORDER — CYANOCOBALAMIN 1000 MCG/ML IJ SOLN
1000.0000 ug | Freq: Every day | INTRAMUSCULAR | Status: AC
  Administered 2022-04-29 – 2022-05-01 (×2): 1000 ug via INTRAMUSCULAR
  Filled 2022-04-29 (×3): qty 1

## 2022-04-29 MED ORDER — INSULIN ASPART 100 UNIT/ML IJ SOLN
12.0000 [IU] | Freq: Three times a day (TID) | INTRAMUSCULAR | Status: DC
  Administered 2022-04-29: 12 [IU] via SUBCUTANEOUS

## 2022-04-29 NOTE — Plan of Care (Signed)

## 2022-04-29 NOTE — Progress Notes (Signed)
PROGRESS NOTE  Jason Fisher [id number] DOB: 04-03-1940 DOA: 04/27/2022 PCP: [name], [name], MD  Brief History:  82 year old male with a history of hypertension, hyperlipidemia, diabetes mellitus type 2, coronary artery disease status post BMS to the RCA 2007 and DES to OM2 in August 2017, chronic atrial fibrillation on apixaban, tobacco abuse, CKD stage III, AAA presenting with 2 months of worsening shortness of breath.  The patient states that he is short of breath all the time, but in the last 2 months he has noted increasing dyspnea on exertion and decreased exercise tolerance.  He came to the emergency department on 04/23/2022.  He was given furosemide, IV steroids, and discharged home with a prescription for prednisone.  Apparently he never picked up the prednisone.  Nevertheless, he continues to smoke 3 to 4 cigarettes/day.  His spouse also smokes in the house.  He complains of a chronic cough but denies any hemoptysis.  It is mostly nonproductive.  He denies any worsening lower extremity edema or increasing abdominal girth.  He weighs himself every day.  He states that his weight has been stable for the last 2 months.  He states that his weight usually ranges from 161 to 163 pounds.  He is compliant with his furosemide.  He stated that after his ED visit he had 1 day where his breathing was pretty good, but it worsened once again which caused him to come to the emergency department.  He denies any fevers, chills, headache, neck pain, chest pain, abdominal pain, dysuria, hematuria, hematochezia, melena.  Because of his weight and shortness of breath EMS was activated.  He was noted to have oxygen saturation in the 80s by EMS.  In the ED, the patient was afebrile hemodynamically stable.  He was tachycardic in the 120s.  Blood pressure was elevated at 170/130.  Oxygen saturation was 93% on 2 L.  WBC 15.8, hemoglobin 12.7, platelets 242,000.  Sodium 143, potassium 3.2,  bicarbonate 27, serum creatinine 1.53.  LFTs unremarkable.  WBC 15.8, hemoglobin 12.7, platelets 242,000.  CT chest shows ill-defined nodular opacities in the right lung, the largest 12 x 9 mm and right upper lobe.  There is minimal left pleural effusion.  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   Assessment/Plan:  Acute respiratory failure with hypoxia -Secondary to pneumonia and COPD exacerbation -stable on 3.L -Wean oxygen for saturation 88-92% -3/25 CT chest as discussed above -3/26 VBG--7.45/44/50/30   COPD exacerbation -continue Brovana -start pulmicort -continue Duonebs -continue IV solumedrol, reduced dose to 40 mg   Lobar pneumonia -continue ceftriaxone and doxy -check PCT <0.10   Permanent atrial fibrillation with RVR -continue apixaban -continue diltiazem drip>>transition to po diltiazem -incited by COPD exac and PNA   CKD 3b -baseline creatinine 1.5-1.8   Chronic HFpEF -05/11/20 echo--EF 50-55%, no WMA, trivial MR -clinically euvolemic presently   CAD -no chest pain -continue statin and metoprolol   History AAA repair -AAA endovascular stent grafting by Dr. Trula Slade in June 2018    Type 2 DM with steroid induced hypergycemia, uncontrolled   -hold metformin -novolog sliding scale>>increase to resistant scale -elevated CBGs due to steroids -increased semglee increased to 30 units daily -increased novolog 12 units TIW -check A1C--6.04 Jan 2022   Essential Hypertension -continue metoprolol -hold olmesartan to allow BP margin for diltiazem titration -plan to transition back to po dilt when Afib stable  Hyperlipidemia -continue statin   Tobacco abuse -tobacco cessation discussed  Hypokalemia -repleted -mag 1.9  Hospital delirium -multifactorial including high dose IV steroids, acute medical condition, low B12 -B12--74>>replete -folate--5.8>>replete -UA neg for pyuria -ammonia 13  Severe  Vitamin B12 Deficiency  - start IM B12 injection daily    Family Communication:   spouse 3/26  Consultants:  none  Code Status:  FULL  DVT Prophylaxis:  apixaban   Procedures: As Listed in Progress Note Above  Antibiotics: Ceftriaxone 3/25>> Doxy 3/25>>  Subjective: Less confusion with wife present, no CP and SOB is much improved;   Objective: Vitals:   04/29/22 1200 04/29/22 1300 04/29/22 1500 04/29/22 1520  BP: (!) 124/54 (!) 136/119  (!) 157/91  Pulse: 75 65  84  Resp: 20 18  (!) 23  Temp: 97.9 F (36.6 C)     TempSrc: Oral     SpO2: 97% 94% 96% 97%  Weight:      Height:        Intake/Output Summary (Last 24 hours) at 04/29/2022 1555 Last data filed at 04/29/2022 1200 Gross per 24 hour  Intake 963.38 ml  Output 850 ml  Net 113.38 ml   Weight change: -0.5 kg Exam:  General:  Pt is alert, follows commands appropriately, not in acute distress HEENT: No icterus, No thrush, No neck mass, [place]/AT Cardiovascular: IRRR, S1/S2, no rubs, no gallops Respiratory: better air movement bilateral, no increased WOB, exp wheeze heard bilaterally Abdomen: Soft/+BS, non tender, non distended, no guarding Extremities: No edema, No lymphangitis, No petechiae, No rashes, no synovitis   Data Reviewed: I have personally reviewed following labs and imaging studies Basic Metabolic Panel: Recent Labs  Lab 04/23/22 2145 04/27/22 0738 04/28/22 0432 04/28/22 0901 04/29/22 0441  NA 139 143  --  136 136  K 3.6 3.2*  --  3.4* 3.4*  CL 99 102  --  98 101  CO2 29 27  --  25 26  GLUCOSE 220* 122*  --  401* 262*  BUN 24* 31*  --  33* 50*  CREATININE 1.80* 1.53*  --  1.57* 1.85*  CALCIUM 9.5 9.2  --  8.5* 8.6*  MG  --  1.9 1.9  --   --   PHOS  --   --  3.2  --   --    Liver Function Tests: Recent Labs  Lab 04/27/22 0738  AST 24  ALT 20  ALKPHOS 71  BILITOT 0.8  PROT 8.1  ALBUMIN 3.8   No results for input(s): "LIPASE", "AMYLASE" in the last 168 hours. Recent Labs  Lab  04/28/22 0901  AMMONIA 13   Coagulation Profile: No results for input(s): "INR", "PROTIME" in the last 168 hours. CBC: Recent Labs  Lab 04/23/22 2145 04/27/22 0738 04/28/22 0432  WBC 10.0 15.8* 11.7*  NEUTROABS  --  11.4*  --   HGB 12.5* 12.7* 10.6*  HCT 39.1 39.1 32.6*  MCV 91.1 91.1 89.8  PLT 207 242 218   Cardiac Enzymes: No results for input(s): "CKTOTAL", "CKMB", "CKMBINDEX", "TROPONINI" in the last 168 hours. BNP: Invalid input(s): "POCBNP" CBG: Recent Labs  Lab 04/28/22 1200 04/28/22 1547 04/28/22 2140 04/29/22 0737 04/29/22 1145  GLUCAP 358* 227* 322* 282* 347*   HbA1C: No results for input(s): "HGBA1C" in the last 72 hours. Urine analysis:    Component Value Date/Time   COLORURINE YELLOW 04/28/2022 1057   APPEARANCEUR CLEAR 04/28/2022 1057   LABSPEC 1.017 04/28/2022 1057   PHURINE 5.0 04/28/2022  1057   GLUCOSEU >=500 (A) 04/28/2022 1057   HGBUR SMALL (A) 04/28/2022 1057   HGBUR trace-intact 02/16/2007 1038   BILIRUBINUR NEGATIVE 04/28/2022 1057   BILIRUBINUR n 08/30/2015 1045   KETONESUR NEGATIVE 04/28/2022 1057   PROTEINUR 30 (A) 04/28/2022 1057   UROBILINOGEN 1.0 08/30/2015 1045   UROBILINOGEN 1.0 01/14/2010 0629   NITRITE NEGATIVE 04/28/2022 1057   LEUKOCYTESUR NEGATIVE 04/28/2022 1057    Recent Results (from the past 240 hour(s))  Resp panel by RT-PCR (RSV, Flu A&B, Covid) Anterior Nasal Swab     Status: None   Collection Time: 04/27/22  7:03 AM   Specimen: Anterior Nasal Swab  Result Value Ref Range Status   SARS Coronavirus 2 by RT PCR NEGATIVE NEGATIVE Final    Comment: (NOTE) SARS-CoV-2 target nucleic acids are NOT DETECTED.  The SARS-CoV-2 RNA is generally detectable in upper respiratory specimens during the acute phase of infection. The lowest concentration of SARS-CoV-2 viral copies this assay can detect is 138 copies/mL. A negative result does not preclude SARS-Cov-2 infection and should not be used as the sole basis for  treatment or other patient management decisions. A negative result may occur with  improper specimen collection/handling, submission of specimen other than nasopharyngeal swab, presence of viral mutation(s) within the areas targeted by this assay, and inadequate number of viral copies(<138 copies/mL). A negative result must be combined with clinical observations, patient history, and epidemiological information. The expected result is Negative.  Fact Sheet for Patients:  EntrepreneurPulse.com.au  Fact Sheet for Healthcare Providers:  IncredibleEmployment.be  This test is no t yet approved or cleared by the [place] FDA and  has been authorized for detection and/or diagnosis of SARS-CoV-2 by FDA under an Emergency Use Authorization (EUA). This EUA will remain  in effect (meaning this test can be used) for the duration of the COVID-19 declaration under Section 564(b)(1) of the Act, 21 U.S.C.section 360bbb-3(b)(1), unless the authorization is terminated  or revoked sooner.       Influenza A by PCR NEGATIVE NEGATIVE Final   Influenza B by PCR NEGATIVE NEGATIVE Final    Comment: (NOTE) The Xpert Xpress SARS-CoV-2/FLU/RSV plus assay is intended as an aid in the diagnosis of influenza from Nasopharyngeal swab specimens and should not be used as a sole basis for treatment. Nasal washings and aspirates are unacceptable for Xpert Xpress SARS-CoV-2/FLU/RSV testing.  Fact Sheet for Patients: EntrepreneurPulse.com.au  Fact Sheet for Healthcare Providers: IncredibleEmployment.be  This test is not yet approved or cleared by the [place] FDA and has been authorized for detection and/or diagnosis of SARS-CoV-2 by FDA under an Emergency Use Authorization (EUA). This EUA will remain in effect (meaning this test can be used) for the duration of the COVID-19 declaration under Section 564(b)(1) of the Act, 21  U.S.C. section 360bbb-3(b)(1), unless the authorization is terminated or revoked.     Resp Syncytial Virus by PCR NEGATIVE NEGATIVE Final    Comment: (NOTE) Fact Sheet for Patients: EntrepreneurPulse.com.au  Fact Sheet for Healthcare Providers: IncredibleEmployment.be  This test is not yet approved or cleared by the [place] FDA and has been authorized for detection and/or diagnosis of SARS-CoV-2 by FDA under an Emergency Use Authorization (EUA). This EUA will remain in effect (meaning this test can be used) for the duration of the COVID-19 declaration under Section 564(b)(1) of the Act, 21 U.S.C. section 360bbb-3(b)(1), unless the authorization is terminated or revoked.  Performed at [facility], [address], [place], [place] [address]  MRSA Next Gen by PCR, Nasal     Status: None   Collection Time: 04/27/22 12:27 PM   Specimen: Nasal Mucosa; Nasal Swab  Result Value Ref Range Status   MRSA by PCR Next Gen NOT DETECTED NOT DETECTED Final    Comment: (NOTE) The GeneXpert MRSA Assay (FDA approved for NASAL specimens only), is one component of a comprehensive MRSA colonization surveillance program. It is not intended to diagnose MRSA infection nor to guide or monitor treatment for MRSA infections. Test performance is not FDA approved in patients less than 75 years old. Performed at [facility], [address], [place], [place] [address]      Scheduled Meds:  apixaban  2.5 mg Oral BID   arformoterol  15 mcg Nebulization BID   atorvastatin  40 mg Oral Daily   budesonide (PULMICORT) nebulizer solution  0.5 mg Nebulization BID   Chlorhexidine Gluconate Cloth  6 each Topical Daily   diltiazem  90 mg Oral Q6H   doxycycline  100 mg Oral [id number]   folic acid  1 mg Oral Daily   [START ON 04/30/2022] furosemide  80 mg Oral Daily   guaiFENesin  600 mg Oral BID   insulin aspart  0-15 Units Subcutaneous TID WC   insulin aspart  0-5 Units  Subcutaneous QHS   insulin aspart  12 Units Subcutaneous TID WC   insulin glargine-yfgn  30 Units Subcutaneous Daily   ipratropium-albuterol  3 mL Nebulization TID   isosorbide mononitrate  30 mg Oral Daily   methylPREDNISolone (SOLU-MEDROL) injection  40 mg Intravenous Q12H   metoprolol tartrate  100 mg Oral BID   [START ON 04/30/2022] potassium chloride  40 mEq Oral Daily   vitamin B-12  500 mcg Oral Daily   Continuous Infusions:  cefTRIAXone (ROCEPHIN)  IV Stopped (04/29/22 1010)    Procedures/Studies: DG Chest 1 View  Result Date: 04/29/2022 CLINICAL DATA:  Shortness of breath. EXAM: CHEST  1 VIEW COMPARISON:  04/27/2022 FINDINGS: Cardiopericardial silhouette is at upper limits of normal for size. Interstitial markings are diffusely coarsened with chronic features. Subtle nodular densities in the right lung were better evaluated by CT chest yesterday. Minimal atelectasis noted in the lung bases with tiny bilateral pleural effusions. Telemetry leads overlie the chest. IMPRESSION: 1. Tiny bilateral pleural effusions with bibasilar atelectasis. 2. Subtle nodular densities in the right lung were better evaluated by CT chest yesterday. Electronically Signed   By: Misty Stanley M.D.   On: 04/29/2022 06:30   CT Chest Wo Contrast  Result Date: 04/27/2022 CLINICAL DATA:  Shortness of breath. EXAM: CT CHEST WITHOUT CONTRAST TECHNIQUE: Multidetector CT imaging of the chest was performed following the standard protocol without IV contrast. RADIATION DOSE REDUCTION: This exam was performed according to the departmental dose-optimization program which includes automated exposure control, adjustment of the mA and/or kV according to patient size and/or use of iterative reconstruction technique. COMPARISON:  January 15, 2016. FINDINGS: Cardiovascular: Atherosclerosis of thoracic aorta is noted without aneurysm formation. Normal cardiac size. No pericardial effusion. Extensive coronary artery calcifications  are noted. Mediastinum/Nodes: No enlarged mediastinal or axillary lymph nodes. Thyroid gland, trachea, and esophagus demonstrate no significant findings. Lungs/Pleura: No pneumothorax is noted. Minimal left pleural effusion is noted. There is interval development of ill-defined patchy nodular opacities in the right lung, the largest measuring 12 x 9 mm on image number 67 of series 4 in the right upper lobe. Emphysematous disease is noted bilaterally. Mild bilateral posterior basilar subsegmental atelectasis is noted.  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 Musculoskeletal: No chest wall mass or suspicious bone lesions identified. IMPRESSION: Interval development of ill-defined patchy nodular opacities in the right lung, the largest measuring 12 x 9 mm in the right upper lobe. While this may represent multifocal pneumonia or inflammation, metastatic disease or other neoplasm cannot be excluded. Follow-up chest CT in 2-3 weeks is recommended to ensure resolution or stability of these abnormalities. Minimal left pleural effusion is noted. Hepatic cirrhosis. Extensive coronary artery calcifications are noted suggesting coronary artery disease. 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 Aortic Atherosclerosis (ICD10-I70.0) and Emphysema (ICD10-J43.9). Electronically Signed   By: Marijo Conception M.D.   On: 04/27/2022 09:36   DG Chest Port 1 View  Result Date: 04/27/2022 CLINICAL DATA:  Shortness of breath.  Dyspnea. EXAM: PORTABLE CHEST 1 VIEW COMPARISON:  04/23/2022 FINDINGS: The lungs are clear without focal pneumonia, edema, pneumothorax or pleural effusion. Tiny pulmonary nodule identified right mid lung. Interstitial markings are diffusely coarsened with chronic features. Cardiopericardial silhouette is at upper limits of normal for size. Bones are diffusely demineralized. Telemetry  leads overlie the chest. IMPRESSION: Chronic interstitial coarsening without acute cardiopulmonary findings. Tiny pulmonary nodule identified right mid lung. CT chest without contrast recommended to further evaluate. Electronically Signed   By: Misty Stanley M.D.   On: 04/27/2022 07:58   DG Chest 2 View  Result Date: 04/23/2022 CLINICAL DATA:  Shortness of breath EXAM: CHEST - 2 VIEW COMPARISON:  05/10/2020, CT 01/15/2016 FINDINGS: Hyperinflation with emphysema. Trace pleural effusions versus pleural scarring. Normal cardiac size. Aortic atherosclerosis. No acute airspace disease IMPRESSION: Hyperinflation with emphysema. Trace pleural effusions versus pleural scarring. Electronically Signed   By: Donavan Foil M.D.   On: 04/23/2022 21:43   VAS [place] EVAR DUPLEX  Result Date: 04/07/2022 Endovascular Aortic Repair Study (EVAR) Patient Name:  EDSON FRUTH  Date of Exam:   04/07/2022 Medical Rec #: [id number]       Accession #:    [id number] Date of Birth: Feb 10, 1940       Patient Gender: M Patient Age:   34 years Exam Location:  [facility] Procedure:      VAS [place] EVAR DUPLEX Referring Phys: Karoline Caldwell --------------------------------------------------------------------------------  Indications: Follow up exam for EVAR. Surgery date 07/31/16. Risk Factors: Hypertension, current smoker, prior MI, coronary artery disease. Other Factors: CHF, Hx AAA, Carotid AD. Limitations: Air/bowel gas and obesity.  Comparison Study: 07/31/16 EVAR Performing Technologist: Elta Guadeloupe RVT, RDMS  Examination Guidelines: A complete evaluation includes B-mode imaging, spectral Doppler, color Doppler, and power Doppler as needed of all accessible portions of each vessel. Bilateral testing is considered an integral part of a complete examination. Limited examinations for reoccurring indications may be performed as noted.  Endovascular Aortic Repair (EVAR):  +----------+----------------+-------------------+-------------------+           Diameter AP (cm)Diameter Trans (cm)Velocities (cm/sec) +----------+----------------+-------------------+-------------------+ Aorta     3.81            4.34               25                  +----------+----------------+-------------------+-------------------+ Right Limb1.44                               41                  +----------+----------------+-------------------+-------------------+  Left Limb 1.50                               43                  +----------+----------------+-------------------+-------------------+  Summary: Abdominal Aorta: Patent endovascular aneurysm repair with no evidence of endoleak.  *See table(s) above for measurements and observations.  Electronically signed by Monica Martinez MD on 04/07/2022 at 10:00:28 AM.    Final     Irwin Brakeman, MD  How to contact the [facility] Attending or Consulting provider [place] or covering provider during after hours [place], for this patient?  Check the care team in [facility] and look for a) attending/consulting [facility] provider listed and b) the [facility] team listed Log into [email or web address] and use [place]'s universal password to access. If you do not have the password, please contact the hospital operator. Locate the [facility] provider you are looking for under [facility] and page to a number that you can be directly reached. If you still have difficulty reaching the provider, please page the [facility] (Director on Call) for the Hospitalists listed on amion for assistance.  04/29/2022, 3:55 PM   LOS: 2 days

## 2022-04-29 NOTE — Progress Notes (Signed)
Pt continues to be confused, hallucinating, attempts to get out of bed regularly, wife at bedside, bed alarm on, no slip socks on.

## 2022-04-30 ENCOUNTER — Inpatient Hospital Stay (HOSPITAL_COMMUNITY): Payer: Medicare Other

## 2022-04-30 ENCOUNTER — Encounter (HOSPITAL_COMMUNITY): Payer: Self-pay | Admitting: Internal Medicine

## 2022-04-30 DIAGNOSIS — J9601 Acute respiratory failure with hypoxia: Secondary | ICD-10-CM | POA: Diagnosis not present

## 2022-04-30 DIAGNOSIS — J441 Chronic obstructive pulmonary disease with (acute) exacerbation: Secondary | ICD-10-CM | POA: Diagnosis not present

## 2022-04-30 DIAGNOSIS — J181 Lobar pneumonia, unspecified organism: Secondary | ICD-10-CM | POA: Diagnosis not present

## 2022-04-30 DIAGNOSIS — I4821 Permanent atrial fibrillation: Secondary | ICD-10-CM | POA: Diagnosis not present

## 2022-04-30 LAB — GLUCOSE, CAPILLARY
Glucose-Capillary: 142 mg/dL — ABNORMAL HIGH (ref 70–99)
Glucose-Capillary: 179 mg/dL — ABNORMAL HIGH (ref 70–99)
Glucose-Capillary: 231 mg/dL — ABNORMAL HIGH (ref 70–99)
Glucose-Capillary: 143 mg/dL — ABNORMAL HIGH (ref 70–99)

## 2022-04-30 MED ORDER — LORAZEPAM 2 MG/ML IJ SOLN
1.0000 mg | Freq: Once | INTRAMUSCULAR | Status: AC
  Administered 2022-04-30: 1 mg via INTRAVENOUS
  Filled 2022-04-30: qty 1

## 2022-04-30 MED ORDER — INSULIN ASPART 100 UNIT/ML IJ SOLN: 5.0000 [IU] | Freq: Three times a day (TID) | INTRAMUSCULAR | Status: DC

## 2022-04-30 MED ORDER — QUETIAPINE FUMARATE 25 MG PO TABS
25.0000 mg | ORAL_TABLET | Freq: Three times a day (TID) | ORAL | Status: DC
  Administered 2022-04-30: 25 mg via ORAL
  Filled 2022-04-30 (×3): qty 1

## 2022-04-30 MED ORDER — ZIPRASIDONE MESYLATE 20 MG IM SOLR
10.0000 mg | Freq: Once | INTRAMUSCULAR | Status: AC
  Administered 2022-04-30: 10 mg via INTRAMUSCULAR
  Filled 2022-04-30 (×2): qty 20

## 2022-04-30 NOTE — Progress Notes (Signed)
Patient c/o SOB, that he couldn't breathe. Patient repositioned in bed to a 64 degree angle. Patient sating between 88-91% on RA. Patient not due for a PRN neb treatment at this time. Will continue to monitor.

## 2022-04-30 NOTE — Progress Notes (Signed)
Patient's sons are requesting another Chest Xray at some point to further discovery is PNA is even better.  Explained that I would put a note in that the MD could see.

## 2022-04-30 NOTE — Progress Notes (Addendum)
Upon assessment this morning, pt was noted to be very restless and agitated. Family is bedside. Was able administer some of morning medications but not all. Pt is up in the chair with no gown and refuses telemetry monitoring. Pt's eyes also appear to be swollen. MD notified. No new orders at this time. Will continue to monitor pt behaviors.

## 2022-04-30 NOTE — Progress Notes (Signed)
Report given to [facility], pt taken to room 312 on tele via wheelchair, pt wife continues at bedside.

## 2022-04-30 NOTE — Progress Notes (Signed)
Patient agitated. Patient has another skin tear to the L hand inbetween his fingers from picking at his skin. Mepilex placed.

## 2022-04-30 NOTE — Progress Notes (Signed)
PROGRESS NOTE  Jason JAGGERS [id number] DOB: 01/04/1941 DOA: 04/27/2022 PCP: [name], [name], MD  Brief History:  82 year old male with a history of hypertension, hyperlipidemia, diabetes mellitus type 2, coronary artery disease status post BMS to the RCA 2007 and DES to OM2 in August 2017, chronic atrial fibrillation on apixaban, tobacco abuse, CKD stage III, AAA presenting with 2 months of worsening shortness of breath.  The patient states that he is short of breath all the time, but in the last 2 months he has noted increasing dyspnea on exertion and decreased exercise tolerance.  He came to the emergency department on 04/23/2022.  He was given furosemide, IV steroids, and discharged home with a prescription for prednisone.  Apparently he never picked up the prednisone.  Nevertheless, he continues to smoke 3 to 4 cigarettes/day.  His spouse also smokes in the house.  He complains of a chronic cough but denies any hemoptysis.  It is mostly nonproductive.  He denies any worsening lower extremity edema or increasing abdominal girth.  He weighs himself every day.  He states that his weight has been stable for the last 2 months.  He states that his weight usually ranges from 161 to 163 pounds.  He is compliant with his furosemide.  He stated that after his ED visit he had 1 day where his breathing was pretty good, but it worsened once again which caused him to come to the emergency department.  He denies any fevers, chills, headache, neck pain, chest pain, abdominal pain, dysuria, hematuria, hematochezia, melena.  Because of his weight and shortness of breath EMS was activated.  He was noted to have oxygen saturation in the 80s by EMS.  In the ED, the patient was afebrile hemodynamically stable.  He was tachycardic in the 120s.  Blood pressure was elevated at 170/130.  Oxygen saturation was 93% on 2 L.  WBC 15.8, hemoglobin 12.7, platelets 242,000.  Sodium 143, potassium 3.2,  bicarbonate 27, serum creatinine 1.53.  LFTs unremarkable.  WBC 15.8, hemoglobin 12.7, platelets 242,000.  CT chest shows ill-defined nodular opacities in the right lung, the largest 12 x 9 mm and right upper lobe.  There is minimal left pleural effusion.  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   Assessment/Plan:  Acute respiratory failure with hypoxia -Secondary to pneumonia and COPD exacerbation -stable on 3.L -Wean oxygen for saturation 88-92% -3/25 CT chest as discussed above -3/26 VBG--7.45/44/50/30   COPD exacerbation -continue Brovana -start pulmicort -continue Duonebs -treated with IV solumedrol -STOPPING STEROIDS DUE TO SEVERE DELIRIUM    Lobar pneumonia -continue ceftriaxone and doxy -check PCT <0.10   Permanent atrial fibrillation with RVR -continue apixaban -continue diltiazem drip>>transition to po diltiazem -incited by COPD exac and PNA   CKD 3b -baseline creatinine 1.5-1.8   Chronic HFpEF -05/11/20 echo--EF 50-55%, no WMA, trivial MR -clinically euvolemic presently   CAD -no chest pain -continue statin and metoprolol   History AAA repair -AAA endovascular stent grafting by Dr. Trula Slade in June 2018    Type 2 DM with steroid induced hypergycemia, uncontrolled   -hold metformin -novolog sliding scale>>increase to resistant scale -elevated CBGs due to steroids -increased semglee increased to 30 units daily -increased novolog 12 units TIW -check A1C--6.04 Jan 2022   Essential Hypertension -continue metoprolol -hold olmesartan to allow BP margin for diltiazem titration -plan to transition back to po dilt when  Afib stable   Hyperlipidemia -continue statin   Tobacco abuse -tobacco cessation discussed  Hypokalemia -repleted -mag 1.9  Severe Acute Hospital delirium -multifactorial including high dose IV steroids, acute medical condition, low B12 -B12--74>>replete -folate--5.8>>replete -UA  neg for pyuria -ammonia 13 -STOPPING STEROIDS  -delirium precautions -family remains at bedside -given severe symptoms starting seroquel 25 mg TID  -if no improvement may need to transfer to SDU and start IV precedex infusion  Severe Vitamin B12 Deficiency  - start IM B12 injection daily    Family Communication:   spouse 3/26  Consultants:  none  Code Status:  FULL  DVT Prophylaxis:  apixaban   Procedures: As Listed in Progress Note Above  Antibiotics: Ceftriaxone 3/25>> Doxy 3/25>>  Subjective: Had severe delirium / sundowning overnight, treated with lorazepam (no response) and then geodon, still very fidgety   Objective: Vitals:   04/30/22 0302 04/30/22 0308 04/30/22 0701 04/30/22 0830  BP:  131/89  135/78  Pulse:  82  74  Resp:  18  (!) 24  Temp:  97.7 F (36.5 C)  98.2 F (36.8 C)  TempSrc:    Axillary  SpO2: 92% 92% 91% 91%  Weight:      Height:        Intake/Output Summary (Last 24 hours) at 04/30/2022 1805 Last data filed at 04/30/2022 1300 Gross per 24 hour  Intake 770 ml  Output --  Net 770 ml   Weight change: 4.1 kg Exam:  General:  Pt is alert,having acute delirium symptoms, Not in acute distress HEENT: No icterus, No thrush, No neck mass, [place]/AT Cardiovascular: IRRR, S1/S2, no rubs, no gallops Respiratory: BBS heard, no increased WOB, exp wheeze heard bilaterally Abdomen: Soft/+BS, non tender, non distended, no guarding Extremities: No edema, No lymphangitis, No petechiae, No rashes, no synovitis  Data Reviewed: I have personally reviewed following labs and imaging studies Basic Metabolic Panel: Recent Labs  Lab 04/23/22 2145 04/27/22 0738 04/28/22 0432 04/28/22 0901 04/29/22 0441  NA 139 143  --  136 136  K 3.6 3.2*  --  3.4* 3.4*  CL 99 102  --  98 101  CO2 29 27  --  25 26  GLUCOSE 220* 122*  --  401* 262*  BUN 24* 31*  --  33* 50*  CREATININE 1.80* 1.53*  --  1.57* 1.85*  CALCIUM 9.5 9.2  --  8.5* 8.6*  MG  --  1.9 1.9  --    --   PHOS  --   --  3.2  --   --    Liver Function Tests: Recent Labs  Lab 04/27/22 0738  AST 24  ALT 20  ALKPHOS 71  BILITOT 0.8  PROT 8.1  ALBUMIN 3.8   No results for input(s): "LIPASE", "AMYLASE" in the last 168 hours. Recent Labs  Lab 04/28/22 0901  AMMONIA 13   Coagulation Profile: No results for input(s): "INR", "PROTIME" in the last 168 hours. CBC: Recent Labs  Lab 04/23/22 2145 04/27/22 0738 04/28/22 0432  WBC 10.0 15.8* 11.7*  NEUTROABS  --  11.4*  --   HGB 12.5* 12.7* 10.6*  HCT 39.1 39.1 32.6*  MCV 91.1 91.1 89.8  PLT 207 242 218   Cardiac Enzymes: No results for input(s): "CKTOTAL", "CKMB", "CKMBINDEX", "TROPONINI" in the last 168 hours. BNP: Invalid input(s): "POCBNP" CBG: Recent Labs  Lab 04/29/22 1547 04/29/22 2107 04/30/22 0314 04/30/22 0824 04/30/22 1658  GLUCAP 133* 224* 142* 179* 231*   HbA1C: No results  for input(s): "HGBA1C" in the last 72 hours. Urine analysis:    Component Value Date/Time   COLORURINE YELLOW 04/28/2022 1057   APPEARANCEUR CLEAR 04/28/2022 1057   LABSPEC 1.017 04/28/2022 1057   PHURINE 5.0 04/28/2022 1057   GLUCOSEU >=500 (A) 04/28/2022 1057   HGBUR SMALL (A) 04/28/2022 1057   HGBUR trace-intact 02/16/2007 1038   BILIRUBINUR NEGATIVE 04/28/2022 1057   BILIRUBINUR n 08/30/2015 1045   KETONESUR NEGATIVE 04/28/2022 1057   PROTEINUR 30 (A) 04/28/2022 1057   UROBILINOGEN 1.0 08/30/2015 1045   UROBILINOGEN 1.0 01/14/2010 0629   NITRITE NEGATIVE 04/28/2022 1057   LEUKOCYTESUR NEGATIVE 04/28/2022 1057    Recent Results (from the past 240 hour(s))  Resp panel by RT-PCR (RSV, Flu A&B, Covid) Anterior Nasal Swab     Status: None   Collection Time: 04/27/22  7:03 AM   Specimen: Anterior Nasal Swab  Result Value Ref Range Status   SARS Coronavirus 2 by RT PCR NEGATIVE NEGATIVE Final    Comment: (NOTE) SARS-CoV-2 target nucleic acids are NOT DETECTED.  The SARS-CoV-2 RNA is generally detectable in upper  respiratory specimens during the acute phase of infection. The lowest concentration of SARS-CoV-2 viral copies this assay can detect is 138 copies/mL. A negative result does not preclude SARS-Cov-2 infection and should not be used as the sole basis for treatment or other patient management decisions. A negative result may occur with  improper specimen collection/handling, submission of specimen other than nasopharyngeal swab, presence of viral mutation(s) within the areas targeted by this assay, and inadequate number of viral copies(<138 copies/mL). A negative result must be combined with clinical observations, patient history, and epidemiological information. The expected result is Negative.  Fact Sheet for Patients:  EntrepreneurPulse.com.au  Fact Sheet for Healthcare Providers:  IncredibleEmployment.be  This test is no t yet approved or cleared by the [place] FDA and  has been authorized for detection and/or diagnosis of SARS-CoV-2 by FDA under an Emergency Use Authorization (EUA). This EUA will remain  in effect (meaning this test can be used) for the duration of the COVID-19 declaration under Section 564(b)(1) of the Act, 21 U.S.C.section 360bbb-3(b)(1), unless the authorization is terminated  or revoked sooner.       Influenza A by PCR NEGATIVE NEGATIVE Final   Influenza B by PCR NEGATIVE NEGATIVE Final    Comment: (NOTE) The Xpert Xpress SARS-CoV-2/FLU/RSV plus assay is intended as an aid in the diagnosis of influenza from Nasopharyngeal swab specimens and should not be used as a sole basis for treatment. Nasal washings and aspirates are unacceptable for Xpert Xpress SARS-CoV-2/FLU/RSV testing.  Fact Sheet for Patients: EntrepreneurPulse.com.au  Fact Sheet for Healthcare Providers: IncredibleEmployment.be  This test is not yet approved or cleared by the [place] FDA and has been  authorized for detection and/or diagnosis of SARS-CoV-2 by FDA under an Emergency Use Authorization (EUA). This EUA will remain in effect (meaning this test can be used) for the duration of the COVID-19 declaration under Section 564(b)(1) of the Act, 21 U.S.C. section 360bbb-3(b)(1), unless the authorization is terminated or revoked.     Resp Syncytial Virus by PCR NEGATIVE NEGATIVE Final    Comment: (NOTE) Fact Sheet for Patients: EntrepreneurPulse.com.au  Fact Sheet for Healthcare Providers: IncredibleEmployment.be  This test is not yet approved or cleared by the [place] FDA and has been authorized for detection and/or diagnosis of SARS-CoV-2 by FDA under an Emergency Use Authorization (EUA). This EUA will remain in effect (meaning this test can be  used) for the duration of the COVID-19 declaration under Section 564(b)(1) of the Act, 21 U.S.C. section 360bbb-3(b)(1), unless the authorization is terminated or revoked.  Performed at [facility], [address], [place], [place] [address]   MRSA Next Gen by PCR, Nasal     Status: None   Collection Time: 04/27/22 12:27 PM   Specimen: Nasal Mucosa; Nasal Swab  Result Value Ref Range Status   MRSA by PCR Next Gen NOT DETECTED NOT DETECTED Final    Comment: (NOTE) The GeneXpert MRSA Assay (FDA approved for NASAL specimens only), is one component of a comprehensive MRSA colonization surveillance program. It is not intended to diagnose MRSA infection nor to guide or monitor treatment for MRSA infections. Test performance is not FDA approved in patients less than 49 years old. Performed at [facility], [address], [place], [place] [address]      Scheduled Meds:  apixaban  2.5 mg Oral BID   arformoterol  15 mcg Nebulization BID   atorvastatin  40 mg Oral Daily   budesonide (PULMICORT) nebulizer solution  0.5 mg Nebulization BID   Chlorhexidine Gluconate Cloth  6 each Topical Daily    cyanocobalamin  1,000 mcg Intramuscular Daily   diltiazem  360 mg Oral Daily   doxycycline  100 mg Oral [id number]   folic acid  1 mg Oral Daily   furosemide  80 mg Oral Daily   guaiFENesin  600 mg Oral BID   insulin aspart  0-20 Units Subcutaneous TID WC   insulin aspart  0-5 Units Subcutaneous QHS   insulin aspart  5 Units Subcutaneous TID WC   insulin glargine-yfgn  30 Units Subcutaneous Daily   ipratropium-albuterol  3 mL Nebulization TID   isosorbide mononitrate  30 mg Oral Daily   melatonin  6 mg Oral QHS   metoprolol tartrate  100 mg Oral BID   potassium chloride  40 mEq Oral Daily   QUEtiapine  25 mg Oral TID   Continuous Infusions:    Procedures/Studies: DG Chest 1 View  Result Date: 04/29/2022 CLINICAL DATA:  Shortness of breath. EXAM: CHEST  1 VIEW COMPARISON:  04/27/2022 FINDINGS: Cardiopericardial silhouette is at upper limits of normal for size. Interstitial markings are diffusely coarsened with chronic features. Subtle nodular densities in the right lung were better evaluated by CT chest yesterday. Minimal atelectasis noted in the lung bases with tiny bilateral pleural effusions. Telemetry leads overlie the chest. IMPRESSION: 1. Tiny bilateral pleural effusions with bibasilar atelectasis. 2. Subtle nodular densities in the right lung were better evaluated by CT chest yesterday. Electronically Signed   By: Misty Stanley M.D.   On: 04/29/2022 06:30   CT Chest Wo Contrast  Result Date: 04/27/2022 CLINICAL DATA:  Shortness of breath. EXAM: CT CHEST WITHOUT CONTRAST TECHNIQUE: Multidetector CT imaging of the chest was performed following the standard protocol without IV contrast. RADIATION DOSE REDUCTION: This exam was performed according to the departmental dose-optimization program which includes automated exposure control, adjustment of the mA and/or kV according to patient size and/or use of iterative reconstruction technique. COMPARISON:  January 15, 2016. FINDINGS:  Cardiovascular: Atherosclerosis of thoracic aorta is noted without aneurysm formation. Normal cardiac size. No pericardial effusion. Extensive coronary artery calcifications are noted. Mediastinum/Nodes: No enlarged mediastinal or axillary lymph nodes. Thyroid gland, trachea, and esophagus demonstrate no significant findings. Lungs/Pleura: No pneumothorax is noted. Minimal left pleural effusion is noted. There is interval development of ill-defined patchy nodular opacities in the right lung, the largest  measuring 12 x 9 mm on image number 67 of series 4 in the right upper lobe. Emphysematous disease is noted bilaterally. Mild bilateral posterior basilar subsegmental atelectasis is noted. 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 Musculoskeletal: No chest wall mass or suspicious bone lesions identified. IMPRESSION: Interval development of ill-defined patchy nodular opacities in the right lung, the largest measuring 12 x 9 mm in the right upper lobe. While this may represent multifocal pneumonia or inflammation, metastatic disease or other neoplasm cannot be excluded. Follow-up chest CT in 2-3 weeks is recommended to ensure resolution or stability of these abnormalities. Minimal left pleural effusion is noted. Hepatic cirrhosis. Extensive coronary artery calcifications are noted suggesting coronary artery disease. 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 Aortic Atherosclerosis (ICD10-I70.0) and Emphysema (ICD10-J43.9). Electronically Signed   By: Marijo Conception M.D.   On: 04/27/2022 09:36   DG Chest Port 1 View  Result Date: 04/27/2022 CLINICAL DATA:  Shortness of breath.  Dyspnea. EXAM: PORTABLE CHEST 1 VIEW COMPARISON:  04/23/2022 FINDINGS: The lungs are clear without focal pneumonia, edema, pneumothorax or pleural effusion. Tiny pulmonary nodule identified right mid lung.  Interstitial markings are diffusely coarsened with chronic features. Cardiopericardial silhouette is at upper limits of normal for size. Bones are diffusely demineralized. Telemetry leads overlie the chest. IMPRESSION: Chronic interstitial coarsening without acute cardiopulmonary findings. Tiny pulmonary nodule identified right mid lung. CT chest without contrast recommended to further evaluate. Electronically Signed   By: Misty Stanley M.D.   On: 04/27/2022 07:58   DG Chest 2 View  Result Date: 04/23/2022 CLINICAL DATA:  Shortness of breath EXAM: CHEST - 2 VIEW COMPARISON:  05/10/2020, CT 01/15/2016 FINDINGS: Hyperinflation with emphysema. Trace pleural effusions versus pleural scarring. Normal cardiac size. Aortic atherosclerosis. No acute airspace disease IMPRESSION: Hyperinflation with emphysema. Trace pleural effusions versus pleural scarring. Electronically Signed   By: Donavan Foil M.D.   On: 04/23/2022 21:43   VAS [place] EVAR DUPLEX  Result Date: 04/07/2022 Endovascular Aortic Repair Study (EVAR) Patient Name:  KAMARI KOPACK  Date of Exam:   04/07/2022 Medical Rec #: [id number]       Accession #:    [id number] Date of Birth: 10-Jul-1940       Patient Gender: M Patient Age:   65 years Exam Location:  [facility] Procedure:      VAS [place] EVAR DUPLEX Referring Phys: Karoline Caldwell --------------------------------------------------------------------------------  Indications: Follow up exam for EVAR. Surgery date 07/31/16. Risk Factors: Hypertension, current smoker, prior MI, coronary artery disease. Other Factors: CHF, Hx AAA, Carotid AD. Limitations: Air/bowel gas and obesity.  Comparison Study: 07/31/16 EVAR Performing Technologist: Elta Guadeloupe RVT, RDMS  Examination Guidelines: A complete evaluation includes B-mode imaging, spectral Doppler, color Doppler, and power Doppler as needed of all accessible portions of each vessel. Bilateral testing is considered an integral part of a complete  examination. Limited examinations for reoccurring indications may be performed as noted.  Endovascular Aortic Repair (EVAR): +----------+----------------+-------------------+-------------------+           Diameter AP (cm)Diameter Trans (cm)Velocities (cm/sec) +----------+----------------+-------------------+-------------------+ Aorta     3.81            4.34               25                  +----------+----------------+-------------------+-------------------+ Right Limb1.44  41                  +----------+----------------+-------------------+-------------------+ Left Limb 1.50                               43                  +----------+----------------+-------------------+-------------------+  Summary: Abdominal Aorta: Patent endovascular aneurysm repair with no evidence of endoleak.  *See table(s) above for measurements and observations.  Electronically signed by Monica Martinez MD on 04/07/2022 at 10:00:28 AM.    Final     Irwin Brakeman, MD  How to contact the [facility] Attending or Consulting provider [place] or covering provider during after hours [place], for this patient?  Check the care team in [facility] and look for a) attending/consulting [facility] provider listed and b) the [facility] team listed Log into [email or web address] and use [place]'s universal password to access. If you do not have the password, please contact the hospital operator. Locate the [facility] provider you are looking for under [facility] and page to a number that you can be directly reached. If you still have difficulty reaching the provider, please page the [facility] (Director on Call) for the Hospitalists listed on amion for assistance.  04/30/2022, 6:05 PM   LOS: 3 days

## 2022-04-30 NOTE — Progress Notes (Signed)
Geodon given. Took 30 minutes for patient to calm down. Patient in bed asleep. Bed alarm on. Wife at bedside. Will continue to monitor.
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
## 2022-04-30 NOTE — Progress Notes (Addendum)
Pt is experiencing increased agitation and at times combative. He's refusing CBG checks and any other medications. NT attempting to feed pt, he ate less than half of an ice cream. Family is still bedside. MD notified. Seroquel ordered and given. Will continue to monitor.

## 2022-04-30 NOTE — Progress Notes (Signed)
MD Wynetta Emery notified of patents increased agitation, restlessness, climbing out of chair. MD informed regarding patients unsteady gait. Per MD Wynetta Emery okay to place order for safety sitter. Family agreeable to Air cabin crew. AC notified.

## 2022-04-30 NOTE — Progress Notes (Signed)
Patient restless. MD Zierle-Ghosh notified. Received order for IV ativan. New IV placed by [facility] RN. Vitals stable. Will continue to monitor.

## 2022-04-30 NOTE — Progress Notes (Signed)
Patient continuing to take his telemetry leads off. Mitts placed. Patient getting agitated because of the mitts. Mitts removed. New skin tear noted to patients L hand from patient attempting to take mitts off. Mepilex placed. Patient up out of bed with 1 assist to the bathroom. Patient now back in bed. Patient having expiratory wheezing. O2 84 % on RA. Patient placed on 1L sats increased to 95 %. O2 removed. Patient now sating at 91% which is within his parameters of 88-92 %. Respiratory called for breathing treatment. Call light within reach. Bed alarm on. Wife at bedside. Will continue to monitor.

## 2022-05-01 ENCOUNTER — Inpatient Hospital Stay (HOSPITAL_COMMUNITY): Payer: Medicare Other

## 2022-05-01 DIAGNOSIS — I4821 Permanent atrial fibrillation: Secondary | ICD-10-CM | POA: Diagnosis not present

## 2022-05-01 DIAGNOSIS — E538 Deficiency of other specified B group vitamins: Secondary | ICD-10-CM | POA: Diagnosis not present

## 2022-05-01 DIAGNOSIS — J441 Chronic obstructive pulmonary disease with (acute) exacerbation: Secondary | ICD-10-CM | POA: Diagnosis not present

## 2022-05-01 DIAGNOSIS — J9601 Acute respiratory failure with hypoxia: Secondary | ICD-10-CM | POA: Diagnosis not present

## 2022-05-01 LAB — BASIC METABOLIC PANEL
Anion gap: 10 (ref 5–15)
BUN: 48 mg/dL — ABNORMAL HIGH (ref 8–23)
CO2: 29 mmol/L (ref 22–32)
Calcium: 9 mg/dL (ref 8.9–10.3)
Chloride: 107 mmol/L (ref 98–111)
Creatinine, Ser: 1.87 mg/dL — ABNORMAL HIGH (ref 0.61–1.24)
GFR, Estimated: 36 mL/min — ABNORMAL LOW (ref >60)
Glucose, Bld: 138 mg/dL — ABNORMAL HIGH (ref 70–99)
Potassium: 3.7 mmol/L (ref 3.5–5.1)
Sodium: 146 mmol/L — ABNORMAL HIGH (ref 135–145)

## 2022-05-01 LAB — GLUCOSE, CAPILLARY
Glucose-Capillary: 174 mg/dL — ABNORMAL HIGH (ref 70–99)
Glucose-Capillary: 130 mg/dL — ABNORMAL HIGH (ref 70–99)
Glucose-Capillary: 143 mg/dL — ABNORMAL HIGH (ref 70–99)
Glucose-Capillary: 62 mg/dL — ABNORMAL LOW (ref 70–99)
Glucose-Capillary: 249 mg/dL — ABNORMAL HIGH (ref 70–99)

## 2022-05-01 LAB — HEMOGLOBIN A1C
Hgb A1c MFr Bld: 6.8 % — ABNORMAL HIGH (ref 4.8–5.6)
Mean Plasma Glucose: 148 mg/dL

## 2022-05-01 LAB — MAGNESIUM: Magnesium: 2.4 mg/dL (ref 1.7–2.4)

## 2022-05-01 MED ORDER — FUROSEMIDE 40 MG PO TABS: 80.0000 mg | ORAL_TABLET | Freq: Every day | ORAL | Status: DC

## 2022-05-01 MED ORDER — QUETIAPINE FUMARATE 25 MG PO TABS
12.5000 mg | ORAL_TABLET | Freq: Three times a day (TID) | ORAL | Status: DC
  Administered 2022-05-01: 12.5 mg via ORAL
  Filled 2022-05-01 (×2): qty 1

## 2022-05-01 NOTE — Progress Notes (Signed)
PROGRESS NOTE  Jason Fisher [id number] DOB: Aug 30, 1940 DOA: 04/27/2022 PCP: [name], [name], MD  Brief History:  82 year old male with a history of hypertension, hyperlipidemia, diabetes mellitus type 2, coronary artery disease status post BMS to the RCA 2007 and DES to OM2 in August 2017, chronic atrial fibrillation on apixaban, tobacco abuse, CKD stage III, AAA presenting with 2 months of worsening shortness of breath.  The patient states that he is short of breath all the time, but in the last 2 months he has noted increasing dyspnea on exertion and decreased exercise tolerance.  He came to the emergency department on 04/23/2022.  He was given furosemide, IV steroids, and discharged home with a prescription for prednisone.  Apparently he never picked up the prednisone.  Nevertheless, he continues to smoke 3 to 4 cigarettes/day.  His spouse also smokes in the house.  He complains of a chronic cough but denies any hemoptysis.  It is mostly nonproductive.  He denies any worsening lower extremity edema or increasing abdominal girth.  He weighs himself every day.  He states that his weight has been stable for the last 2 months.  He states that his weight usually ranges from 161 to 163 pounds.  He is compliant with his furosemide.  He stated that after his ED visit he had 1 day where his breathing was pretty good, but it worsened once again which caused him to come to the emergency department.  He denies any fevers, chills, headache, neck pain, chest pain, abdominal pain, dysuria, hematuria, hematochezia, melena.  Because of his weight and shortness of breath EMS was activated.  He was noted to have oxygen saturation in the 80s by EMS.  In the ED, the patient was afebrile hemodynamically stable.  He was tachycardic in the 120s.  Blood pressure was elevated at 170/130.  Oxygen saturation was 93% on 2 L.  WBC 15.8, hemoglobin 12.7, platelets 242,000.  Sodium 143, potassium 3.2,  bicarbonate 27, serum creatinine 1.53.  LFTs unremarkable.  WBC 15.8, hemoglobin 12.7, platelets 242,000.  CT chest shows ill-defined nodular opacities in the right lung, the largest 12 x 9 mm and right upper lobe.  There is minimal left pleural effusion.  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   Assessment/Plan:  Acute respiratory failure with hypoxia -Secondary to pneumonia and COPD exacerbation -Wean oxygen for saturation 88-92% -3/25 CT chest as discussed above -3/26 VBG--7.45/44/50/30   COPD exacerbation -completed Brovana -coompleted pulmicort -continue Duonebs -treated with IV solumedrol -STOPPED STEROIDS DUE TO SEVERE DELIRIUM    Lobar pneumonia -completed ceftriaxone and doxy -check PCT <0.10 -CXR 3/29 does not show pneumonia   Permanent atrial fibrillation with RVR -continue apixaban -continue diltiazem drip>>transition to po diltiazem -incited by COPD exac and PNA   CKD 3b -baseline creatinine 1.5-1.8   Chronic HFpEF -05/11/20 echo--EF 50-55%, no WMA, trivial MR -clinically euvolemic presently   CAD -no chest pain -continue statin and metoprolol   History AAA repair -AAA endovascular stent grafting by Dr. Trula Slade in June 2018    Type 2 DM with steroid induced hypergycemia, uncontrolled   -hold metformin -novolog sliding scale>>increase to resistant scale -elevated CBGs due to steroids -increased semglee increased to 30 units daily -increased novolog 5 units TIW -check A1C--6.04 Jan 2022   Essential Hypertension -continue metoprolol -hold olmesartan to allow BP margin for diltiazem titration -plan to transition back to  po dilt when Afib stable   Hyperlipidemia -continue statin   Tobacco abuse -tobacco cessation discussed  Hypokalemia -repleted -mag 1.9  Severe Acute Hospital delirium -multifactorial including high dose IV steroids, acute medical condition, low  B12 -B12--74>>replete -folate--5.8>>replete -UA neg for pyuria -ammonia 13 -STOPPED STEROIDS  -delirium precautions -family remains at bedside -given severe symptoms trial of seroquel 12.5 mg TID until resolution of sxs -if no improvement may need to transfer to SDU and start IV precedex infusion  Severe Vitamin B12 Deficiency  - start IM B12 injection daily and will need to continue monthly injections with PCP after DC    Family Communication:   spouse 3/26  Consultants:  none  Code Status:  FULL  DVT Prophylaxis:  apixaban   Procedures: As Listed in Progress Note Above  Antibiotics: Ceftriaxone 3/25>>3/27 Doxy 3/25>>3/29  Subjective: Still having delirium but less severe this morning   Objective: Vitals:   04/30/22 2134 05/01/22 0342 05/01/22 0954 05/01/22 1355  BP: (!) 131/105 107/62 (!) 146/98   Pulse: 62 83 87   Resp: 18 18    Temp: 97.6 F (36.4 C) 98.4 F (36.9 C)    TempSrc: Axillary Oral    SpO2: (!) 86% 93%  94%  Weight:      Height:        Intake/Output Summary (Last 24 hours) at 05/01/2022 1515 Last data filed at 05/01/2022 0700 Gross per 24 hour  Intake --  Output 600 ml  Net -600 ml   Weight change:  Exam:  General:  Pt is alert,having acute delirium symptoms, Not in acute distress HEENT: No icterus, No thrush, No neck mass, [place]/AT Cardiovascular: IRRR, S1/S2, no rubs, no gallops Respiratory: BBS heard, no increased WOB, exp wheeze heard bilaterally Abdomen: Soft/+BS, non tender, non distended, no guarding Extremities: No edema, No lymphangitis, No petechiae, No rashes, no synovitis  Data Reviewed: I have personally reviewed following labs and imaging studies Basic Metabolic Panel: Recent Labs  Lab 04/27/22 0738 04/28/22 0432 04/28/22 0901 04/29/22 0441 05/01/22 0608  NA 143  --  136 136 146*  K 3.2*  --  3.4* 3.4* 3.7  CL 102  --  98 101 107  CO2 27  --  25 26 29   GLUCOSE 122*  --  401* 262* 138*  BUN 31*  --  33* 50* 48*   CREATININE 1.53*  --  1.57* 1.85* 1.87*  CALCIUM 9.2  --  8.5* 8.6* 9.0  MG 1.9 1.9  --   --  2.4  PHOS  --  3.2  --   --   --    Liver Function Tests: Recent Labs  Lab 04/27/22 0738  AST 24  ALT 20  ALKPHOS 71  BILITOT 0.8  PROT 8.1  ALBUMIN 3.8   No results for input(s): "LIPASE", "AMYLASE" in the last 168 hours. Recent Labs  Lab 04/28/22 0901  AMMONIA 13   Coagulation Profile: No results for input(s): "INR", "PROTIME" in the last 168 hours. CBC: Recent Labs  Lab 04/27/22 0738 04/28/22 0432  WBC 15.8* 11.7*  NEUTROABS 11.4*  --   HGB 12.7* 10.6*  HCT 39.1 32.6*  MCV 91.1 89.8  PLT 242 218   Cardiac Enzymes: No results for input(s): "CKTOTAL", "CKMB", "CKMBINDEX", "TROPONINI" in the last 168 hours. BNP: Invalid input(s): "POCBNP" CBG: Recent Labs  Lab 04/30/22 1658 04/30/22 2059 05/01/22 0344 05/01/22 0735 05/01/22 1106  GLUCAP 231* 143* 174* 130* 143*   HbA1C: Recent Labs  04/29/22 1634  HGBA1C 6.8*   Urine analysis:    Component Value Date/Time   COLORURINE YELLOW 04/28/2022 1057   APPEARANCEUR CLEAR 04/28/2022 1057   LABSPEC 1.017 04/28/2022 1057   PHURINE 5.0 04/28/2022 1057   GLUCOSEU >=500 (A) 04/28/2022 1057   HGBUR SMALL (A) 04/28/2022 1057   HGBUR trace-intact 02/16/2007 1038   BILIRUBINUR NEGATIVE 04/28/2022 1057   BILIRUBINUR n 08/30/2015 1045   KETONESUR NEGATIVE 04/28/2022 1057   PROTEINUR 30 (A) 04/28/2022 1057   UROBILINOGEN 1.0 08/30/2015 1045   UROBILINOGEN 1.0 01/14/2010 0629   NITRITE NEGATIVE 04/28/2022 1057   LEUKOCYTESUR NEGATIVE 04/28/2022 1057    Recent Results (from the past 240 hour(s))  Resp panel by RT-PCR (RSV, Flu A&B, Covid) Anterior Nasal Swab     Status: None   Collection Time: 04/27/22  7:03 AM   Specimen: Anterior Nasal Swab  Result Value Ref Range Status   SARS Coronavirus 2 by RT PCR NEGATIVE NEGATIVE Final    Comment: (NOTE) SARS-CoV-2 target nucleic acids are NOT DETECTED.  The SARS-CoV-2  RNA is generally detectable in upper respiratory specimens during the acute phase of infection. The lowest concentration of SARS-CoV-2 viral copies this assay can detect is 138 copies/mL. A negative result does not preclude SARS-Cov-2 infection and should not be used as the sole basis for treatment or other patient management decisions. A negative result may occur with  improper specimen collection/handling, submission of specimen other than nasopharyngeal swab, presence of viral mutation(s) within the areas targeted by this assay, and inadequate number of viral copies(<138 copies/mL). A negative result must be combined with clinical observations, patient history, and epidemiological information. The expected result is Negative.  Fact Sheet for Patients:  EntrepreneurPulse.com.au  Fact Sheet for Healthcare Providers:  IncredibleEmployment.be  This test is no t yet approved or cleared by the [place] FDA and  has been authorized for detection and/or diagnosis of SARS-CoV-2 by FDA under an Emergency Use Authorization (EUA). This EUA will remain  in effect (meaning this test can be used) for the duration of the COVID-19 declaration under Section 564(b)(1) of the Act, 21 U.S.C.section 360bbb-3(b)(1), unless the authorization is terminated  or revoked sooner.       Influenza A by PCR NEGATIVE NEGATIVE Final   Influenza B by PCR NEGATIVE NEGATIVE Final    Comment: (NOTE) The Xpert Xpress SARS-CoV-2/FLU/RSV plus assay is intended as an aid in the diagnosis of influenza from Nasopharyngeal swab specimens and should not be used as a sole basis for treatment. Nasal washings and aspirates are unacceptable for Xpert Xpress SARS-CoV-2/FLU/RSV testing.  Fact Sheet for Patients: EntrepreneurPulse.com.au  Fact Sheet for Healthcare Providers: IncredibleEmployment.be  This test is not yet approved or cleared by the  [place] FDA and has been authorized for detection and/or diagnosis of SARS-CoV-2 by FDA under an Emergency Use Authorization (EUA). This EUA will remain in effect (meaning this test can be used) for the duration of the COVID-19 declaration under Section 564(b)(1) of the Act, 21 U.S.C. section 360bbb-3(b)(1), unless the authorization is terminated or revoked.     Resp Syncytial Virus by PCR NEGATIVE NEGATIVE Final    Comment: (NOTE) Fact Sheet for Patients: EntrepreneurPulse.com.au  Fact Sheet for Healthcare Providers: IncredibleEmployment.be  This test is not yet approved or cleared by the [place] FDA and has been authorized for detection and/or diagnosis of SARS-CoV-2 by FDA under an Emergency Use Authorization (EUA). This EUA will remain in effect (meaning this test can be used)  for the duration of the COVID-19 declaration under Section 564(b)(1) of the Act, 21 U.S.C. section 360bbb-3(b)(1), unless the authorization is terminated or revoked.  Performed at [facility], [address], [place], [place] [address]   MRSA Next Gen by PCR, Nasal     Status: None   Collection Time: 04/27/22 12:27 PM   Specimen: Nasal Mucosa; Nasal Swab  Result Value Ref Range Status   MRSA by PCR Next Gen NOT DETECTED NOT DETECTED Final    Comment: (NOTE) The GeneXpert MRSA Assay (FDA approved for NASAL specimens only), is one component of a comprehensive MRSA colonization surveillance program. It is not intended to diagnose MRSA infection nor to guide or monitor treatment for MRSA infections. Test performance is not FDA approved in patients less than 67 years old. Performed at [facility], [address], [place], [place] [address]      Scheduled Meds:  apixaban  2.5 mg Oral BID   arformoterol  15 mcg Nebulization BID   atorvastatin  40 mg Oral Daily   budesonide (PULMICORT) nebulizer solution  0.5 mg Nebulization BID   cyanocobalamin  1,000  mcg Intramuscular Daily   diltiazem  360 mg Oral Daily   doxycycline  100 mg Oral [id number]   folic acid  1 mg Oral Daily   [START ON 05/03/2022] furosemide  80 mg Oral Daily   guaiFENesin  600 mg Oral BID   insulin aspart  0-20 Units Subcutaneous TID WC   insulin aspart  0-5 Units Subcutaneous QHS   insulin aspart  5 Units Subcutaneous TID WC   insulin glargine-yfgn  30 Units Subcutaneous Daily   ipratropium-albuterol  3 mL Nebulization TID   isosorbide mononitrate  30 mg Oral Daily   melatonin  6 mg Oral QHS   metoprolol tartrate  100 mg Oral BID   potassium chloride  40 mEq Oral Daily   QUEtiapine  12.5 mg Oral TID   Continuous Infusions:    Procedures/Studies: DG Chest Port 1 View  Result Date: 05/01/2022 CLINICAL DATA:  Hypoxia EXAM: PORTABLE CHEST 1 VIEW COMPARISON:  04/29/2022 FINDINGS: The cardio pericardial silhouette is enlarged. Interstitial markings are diffusely coarsened with chronic features. Tiny nodular densities again noted right lung. Interval improvement in basilar aeration with decrease of the tiny pleural effusions seen previously. Prominent skin fold overlies the lateral left lung. Bones are diffusely demineralized. IMPRESSION: 1. Chronic interstitial changes with improved aeration at the bases and decrease in tiny effusions. 2. Tiny nodular densities in the right lung. Electronically Signed   By: Misty Stanley M.D.   On: 05/01/2022 08:35   DG Chest 1 View  Result Date: 04/29/2022 CLINICAL DATA:  Shortness of breath. EXAM: CHEST  1 VIEW COMPARISON:  04/27/2022 FINDINGS: Cardiopericardial silhouette is at upper limits of normal for size. Interstitial markings are diffusely coarsened with chronic features. Subtle nodular densities in the right lung were better evaluated by CT chest yesterday. Minimal atelectasis noted in the lung bases with tiny bilateral pleural effusions. Telemetry leads overlie the chest. IMPRESSION: 1. Tiny bilateral pleural effusions with bibasilar  atelectasis. 2. Subtle nodular densities in the right lung were better evaluated by CT chest yesterday. Electronically Signed   By: Misty Stanley M.D.   On: 04/29/2022 06:30   CT Chest Wo Contrast  Result Date: 04/27/2022 CLINICAL DATA:  Shortness of breath. EXAM: CT CHEST WITHOUT CONTRAST TECHNIQUE: Multidetector CT imaging of the chest was performed following the standard protocol without IV contrast. RADIATION DOSE REDUCTION: This  exam was performed according to the departmental dose-optimization program which includes automated exposure control, adjustment of the mA and/or kV according to patient size and/or use of iterative reconstruction technique. COMPARISON:  January 15, 2016. FINDINGS: Cardiovascular: Atherosclerosis of thoracic aorta is noted without aneurysm formation. Normal cardiac size. No pericardial effusion. Extensive coronary artery calcifications are noted. Mediastinum/Nodes: No enlarged mediastinal or axillary lymph nodes. Thyroid gland, trachea, and esophagus demonstrate no significant findings. Lungs/Pleura: No pneumothorax is noted. Minimal left pleural effusion is noted. There is interval development of ill-defined patchy nodular opacities in the right lung, the largest measuring 12 x 9 mm on image number 67 of series 4 in the right upper lobe. Emphysematous disease is noted bilaterally. Mild bilateral posterior basilar subsegmental atelectasis is noted. 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 Musculoskeletal: No chest wall mass or suspicious bone lesions identified. IMPRESSION: Interval development of ill-defined patchy nodular opacities in the right lung, the largest measuring 12 x 9 mm in the right upper lobe. While this may represent multifocal pneumonia or inflammation, metastatic disease or other neoplasm cannot be excluded. Follow-up chest CT in 2-3 weeks is recommended to ensure resolution or  stability of these abnormalities. Minimal left pleural effusion is noted. Hepatic cirrhosis. Extensive coronary artery calcifications are noted suggesting coronary artery disease. 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 Aortic Atherosclerosis (ICD10-I70.0) and Emphysema (ICD10-J43.9). Electronically Signed   By: Marijo Conception M.D.   On: 04/27/2022 09:36   DG Chest Port 1 View  Result Date: 04/27/2022 CLINICAL DATA:  Shortness of breath.  Dyspnea. EXAM: PORTABLE CHEST 1 VIEW COMPARISON:  04/23/2022 FINDINGS: The lungs are clear without focal pneumonia, edema, pneumothorax or pleural effusion. Tiny pulmonary nodule identified right mid lung. Interstitial markings are diffusely coarsened with chronic features. Cardiopericardial silhouette is at upper limits of normal for size. Bones are diffusely demineralized. Telemetry leads overlie the chest. IMPRESSION: Chronic interstitial coarsening without acute cardiopulmonary findings. Tiny pulmonary nodule identified right mid lung. CT chest without contrast recommended to further evaluate. Electronically Signed   By: Misty Stanley M.D.   On: 04/27/2022 07:58   DG Chest 2 View  Result Date: 04/23/2022 CLINICAL DATA:  Shortness of breath EXAM: CHEST - 2 VIEW COMPARISON:  05/10/2020, CT 01/15/2016 FINDINGS: Hyperinflation with emphysema. Trace pleural effusions versus pleural scarring. Normal cardiac size. Aortic atherosclerosis. No acute airspace disease IMPRESSION: Hyperinflation with emphysema. Trace pleural effusions versus pleural scarring. Electronically Signed   By: Donavan Foil M.D.   On: 04/23/2022 21:43   VAS [place] EVAR DUPLEX  Result Date: 04/07/2022 Endovascular Aortic Repair Study (EVAR) Patient Name:  KARSYN STILTZ  Date of Exam:   04/07/2022 Medical Rec #: [id number]       Accession #:    [id number] Date of Birth: [birth year]       Patient Gender: M Patient Age:   15 years Exam [facility] Procedure:      VAS [place] EVAR DUPLEX Referring Phys: Karoline Caldwell --------------------------------------------------------------------------------  Indications: Follow up exam for EVAR. Surgery date 07/31/16. Risk Factors: Hypertension, current smoker, prior MI, coronary artery disease. Other Factors: CHF, Hx AAA, Carotid AD. Limitations: Air/bowel gas and obesity.  Comparison Study: 07/31/16 EVAR Performing Technologist: Elta Guadeloupe RVT, RDMS  Examination Guidelines: A complete evaluation includes B-mode imaging, spectral Doppler, color Doppler, and power Doppler as needed of all accessible portions of each vessel. Bilateral testing is  considered an integral part of a complete examination. Limited examinations for reoccurring indications may be performed as noted.  Endovascular Aortic Repair (EVAR): +----------+----------------+-------------------+-------------------+           Diameter AP (cm)Diameter Trans (cm)Velocities (cm/sec) +----------+----------------+-------------------+-------------------+ Aorta     3.81            4.34               25                  +----------+----------------+-------------------+-------------------+ Right Limb1.44                               41                  +----------+----------------+-------------------+-------------------+ Left Limb 1.50                               43                  +----------+----------------+-------------------+-------------------+  Summary: Abdominal Aorta: Patent endovascular aneurysm repair with no evidence of endoleak.  *See table(s) above for measurements and observations.  Electronically signed by Monica Martinez MD on 04/07/2022 at 10:00:28 AM.    Final     Irwin Brakeman, MD  How to contact the [facility] Attending or Consulting provider Flasher or covering provider during after hours [place], for this patient?  Check the care team in [facility] and look for a) attending/consulting TRH provider listed and b) the [facility]  team listed Log into [email or web address] and use [place]'s universal password to access. If you do not have the password, please contact the hospital operator. Locate the [facility] provider you are looking for under [facility] and page to a number that you can be directly reached. If you still have difficulty reaching the provider, please page the [facility] (Director on Call) for the Hospitalists listed on amion for assistance.  05/01/2022, 3:15 PM   LOS: 4 days

## 2022-05-01 NOTE — Progress Notes (Signed)
Pt confused and agitated. Pt slept most of the night. Pt refused medications at scheduled time but took them around 2330.

## 2022-05-01 NOTE — Progress Notes (Signed)
0845 Patient sleeping, NT asked that he be allowed to rest. Nebulizer postponed, patient resting comfortably and not in respiratory distress.

## 2022-05-01 NOTE — Progress Notes (Signed)
Ok to stop doxy after dose tonight to complete 5 days per Dr Wynetta Emery.  Onnie Boer, PharmD, BCIDP, AAHIVP, CPP Infectious Disease Pharmacist 05/01/2022 2:01 PM

## 2022-05-01 NOTE — Care Management Important Message (Signed)
Important Message  Patient Details  Name: Jason Fisher MRN: [id number] Date of Birth: Jan 02, 1941   Medicare Important Message Given:  Yes (spoke with son Quita Skye in room)     Tommy Medal 05/01/2022, 12:10 PM

## 2022-05-02 DIAGNOSIS — Z9981 Dependence on supplemental oxygen: Secondary | ICD-10-CM

## 2022-05-02 DIAGNOSIS — E538 Deficiency of other specified B group vitamins: Secondary | ICD-10-CM | POA: Diagnosis present

## 2022-05-02 DIAGNOSIS — J9611 Chronic respiratory failure with hypoxia: Secondary | ICD-10-CM | POA: Diagnosis not present

## 2022-05-02 DIAGNOSIS — I4891 Unspecified atrial fibrillation: Secondary | ICD-10-CM

## 2022-05-02 DIAGNOSIS — J9601 Acute respiratory failure with hypoxia: Secondary | ICD-10-CM | POA: Diagnosis not present

## 2022-05-02 DIAGNOSIS — R410 Disorientation, unspecified: Secondary | ICD-10-CM | POA: Diagnosis not present

## 2022-05-02 LAB — GLUCOSE, CAPILLARY
Glucose-Capillary: 207 mg/dL — ABNORMAL HIGH (ref 70–99)
Glucose-Capillary: 177 mg/dL — ABNORMAL HIGH (ref 70–99)
Glucose-Capillary: 249 mg/dL — ABNORMAL HIGH (ref 70–99)

## 2022-05-02 MED ORDER — POTASSIUM CHLORIDE CRYS ER 20 MEQ PO TBCR: 20.0000 meq | EXTENDED_RELEASE_TABLET | ORAL | 0 refills | Status: DC

## 2022-05-02 MED ORDER — GUAIFENESIN ER 600 MG PO TB12: 600.0000 mg | ORAL_TABLET | Freq: Two times a day (BID) | ORAL | 0 refills | Status: AC

## 2022-05-02 MED ORDER — POLYETHYLENE GLYCOL 3350 17 G PO PACK: 17.0000 g | PACK | Freq: Every day | ORAL | 0 refills | Status: DC

## 2022-05-02 MED ORDER — QUETIAPINE FUMARATE 25 MG PO TABS: 12.5000 mg | ORAL_TABLET | Freq: Two times a day (BID) | ORAL | Status: DC | PRN

## 2022-05-02 MED ORDER — BISACODYL 5 MG PO TBEC
5.0000 mg | DELAYED_RELEASE_TABLET | Freq: Every day | ORAL | Status: DC
  Administered 2022-05-02: 5 mg via ORAL
  Filled 2022-05-02: qty 1

## 2022-05-02 MED ORDER — FOLIC ACID 1 MG PO TABS: 1.0000 mg | ORAL_TABLET | Freq: Every day | ORAL | 1 refills | Status: DC

## 2022-05-02 MED ORDER — VITAMIN B-12 1000 MCG PO TABS: 1000.0000 ug | ORAL_TABLET | Freq: Every day | ORAL | 2 refills | Status: DC

## 2022-05-02 MED ORDER — DEXTROMETHORPHAN POLISTIREX ER 30 MG/5ML PO SUER: 30.0000 mg | Freq: Two times a day (BID) | ORAL | 1 refills | Status: DC | PRN

## 2022-05-02 MED ORDER — INSULIN ASPART 100 UNIT/ML IJ SOLN
3.0000 [IU] | Freq: Three times a day (TID) | INTRAMUSCULAR | Status: DC
  Administered 2022-05-02: 3 [IU] via SUBCUTANEOUS

## 2022-05-02 MED ORDER — BISACODYL 10 MG RE SUPP
10.0000 mg | Freq: Once | RECTAL | Status: DC
  Filled 2022-05-02: qty 1

## 2022-05-02 MED ORDER — TRESIBA FLEXTOUCH 100 UNIT/ML ~~LOC~~ SOPN: 25.0000 [IU] | PEN_INJECTOR | Freq: Every day | SUBCUTANEOUS | 2 refills | Status: DC

## 2022-05-02 MED ORDER — DILTIAZEM HCL ER COATED BEADS 360 MG PO CP24: 360.0000 mg | ORAL_CAPSULE | Freq: Every day | ORAL | 1 refills | Status: DC

## 2022-05-02 MED ORDER — IPRATROPIUM-ALBUTEROL 0.5-2.5 (3) MG/3ML IN SOLN: 3.0000 mL | RESPIRATORY_TRACT | 1 refills | Status: DC | PRN

## 2022-05-02 MED ORDER — INSULIN GLARGINE-YFGN 100 UNIT/ML ~~LOC~~ SOLN
20.0000 [IU] | Freq: Every day | SUBCUTANEOUS | Status: DC
  Administered 2022-05-02: 20 [IU] via SUBCUTANEOUS
  Filled 2022-05-02 (×2): qty 0.2

## 2022-05-02 NOTE — Progress Notes (Signed)
Pt A&O to self and place. Disoriented to time and situation. Tele sitter ordered and has been effective in preventing him from exiting bed. BP elevated. Pt reported pain in his lower left abdomen, PRN tylenol given.

## 2022-05-02 NOTE — Plan of Care (Signed)
  Problem: Acute Rehab PT Goals(only PT should resolve) Goal: Pt Will Go Supine/Side To Sit Outcome: Progressing Flowsheets (Taken 05/02/2022 1034) Pt will go Supine/Side to Sit: with supervision Goal: Patient Will Transfer Sit To/From Stand Outcome: Progressing Flowsheets (Taken 05/02/2022 1034) Patient will transfer sit to/from stand: with supervision Goal: Pt Will Transfer Bed To Chair/Chair To Bed Outcome: Progressing Flowsheets (Taken 05/02/2022 1034) Pt will Transfer Bed to Chair/Chair to Bed: with supervision Goal: Pt Will Ambulate Outcome: Progressing Flowsheets (Taken 05/02/2022 1034) Pt will Ambulate:  100 feet  with supervision  with rolling walker

## 2022-05-02 NOTE — Progress Notes (Addendum)
SATURATION QUALIFICATIONS: (This note is used to comply with regulatory documentation for home oxygen)  Patient Saturations on Room Air at Rest = 90%  Patient Saturations on Room Air while Ambulating = 87%  Patient Saturations on 2 Liters of oxygen while Ambulating = 99%  Please briefly explain why patient needs home oxygen:

## 2022-05-02 NOTE — TOC Transition Note (Addendum)
Transition of Care [facility]) - CM/SW Discharge Note   Patient Details  Name: Jason Fisher MRN: [id number] Date of Birth: 11-13-1940  Transition of Care [facility]) CM/SW Contact:  Iona Beard, [place] Phone Number: 05/02/2022, 12:38 PM  Clinical Narrative:    TOC updated that PT is recommending [facility] for pt at D/C. CSW spoke with pts wife about recommendation. She is agreeable to [facility] set up. CSW spoke to Marjory Lies with [facility] who accepts referral. CSW requested MD place [facility] PT orders. [place] agency info added to AVS. CSW updated that pt will need home O2 at D/C. Orders and Sat qual completed. Pts wife agreeable to [place] for home O2. Referral given to [facility] with [facility]. Tank to be delivered to room. TOC signing off.   Final next level of care: Home w Home Health Services Barriers to Discharge: Barriers Resolved   Patient Goals and CMS Choice CMS Medicare.gov Compare Post Acute Care list provided to:: Patient Represenative (must comment) Choice offered to / list presented to : Spouse  Discharge Placement                         Discharge Plan and Services Additional resources added to the After Visit Summary for                            [facility] Arranged: PT [facility] Agency: [place] Date [place]: 05/02/22   Representative spoke with at [place]: Marjory Lies  Social Determinants of Health ([place]) Interventions SDOH Screenings   Food Insecurity: No Food Insecurity (04/30/2022)  Housing: Low Risk  (04/27/2022)  Transportation Needs: No Transportation Needs (04/30/2022)  Utilities: Not At Risk (04/30/2022)  Depression (PHQ2-9): Low Risk  (01/21/2022)  Financial Resource Strain: Medium Risk (09/23/2020)  Physical Activity: Sufficiently Active (06/08/2019)  Tobacco Use: High Risk (04/30/2022)     Readmission Risk Interventions    04/28/2022    2:20 PM  Readmission Risk Prevention Plan  Transportation Screening Complete  PCP or Specialist Appt within 3-5 Days  Not Complete  HRI or [place] Complete  Social Work Consult for [place] Planning/Counseling Complete  Medication Review Press photographer) Complete

## 2022-05-02 NOTE — Discharge Instructions (Signed)
IMPORTANT INFORMATION: PAY CLOSE ATTENTION   PHYSICIAN DISCHARGE INSTRUCTIONS  Follow with Primary care provider  [name], [name], MD  and other consultants as instructed by your Hospitalist Physician  [place] IF SYMPTOMS COME BACK, WORSEN OR NEW PROBLEM DEVELOPS   Please note: You were cared for by a hospitalist during your hospital stay. Every effort will be made to forward records to your primary care provider.  You can request that your primary care provider send for your hospital records if they have not received them.  Once you are discharged, your primary care physician will handle any further medical issues. Please note that NO REFILLS for any discharge medications will be authorized once you are discharged, as it is imperative that you return to your primary care physician (or establish a relationship with a primary care physician if you do not have one) for your post hospital discharge needs so that they can reassess your need for medications and monitor your lab values.  Please get a complete blood count and chemistry panel checked by your Primary MD at your next visit, and again as instructed by your Primary MD.  Get Medicines reviewed and adjusted: Please take all your medications with you for your next visit with your Primary MD  Laboratory/radiological data: Please request your Primary MD to go over all hospital tests and procedure/radiological results at the follow up, please ask your primary care provider to get all Hospital records sent to his/her office.  In some cases, they will be blood work, cultures and biopsy results pending at the time of your discharge. Please request that your primary care provider follow up on these results.  If you are diabetic, please bring your blood sugar readings with you to your follow up appointment with primary care.    Please call and make your follow up appointments as soon as possible.     Also Note the following: If you experience worsening of your admission symptoms, develop shortness of breath, life threatening emergency, suicidal or homicidal thoughts you must seek medical attention immediately by calling 911 or calling your MD immediately  if symptoms less severe.  You must read complete instructions/literature along with all the possible adverse reactions/side effects for all the Medicines you take and that have been prescribed to you. Take any new Medicines after you have completely understood and accpet all the possible adverse reactions/side effects.   Do not drive when taking Pain medications or sleeping medications (Benzodiazepines)  Do not take more than prescribed Pain, Sleep and Anxiety Medications. It is not advisable to combine anxiety,sleep and pain medications without talking with your primary care practitioner  Special Instructions: If you have smoked or chewed Tobacco  in the last 2 yrs please stop smoking, stop any regular Alcohol  and or any Recreational drug use.  Wear Seat belts while driving.  Do not drive if taking any narcotic, mind altering or controlled substances or recreational drugs or alcohol.

## 2022-05-02 NOTE — Discharge Summary (Signed)
Physician Discharge Summary  Jason Fisher [id number] DOB: [birth year] DOA: 04/27/2022  PCP: [name], [name], MD  Admit date: 04/27/2022 Discharge date: 05/02/2022  Admitted From:  Home  Disposition: Home with [facility]   Recommendations for Outpatient Follow-up:  Follow up with PCP in 1-2 weeks Please monitor B12 levels and consider monthly B12 injections if oral tabs not working Careful with steroids as it caused severe delirium reaction in hospital  Please consider outpatient palliative medicine consultation   Home Health: PT , DME Home Oxygen 2L/min  Discharge Condition: STABLE   CODE STATUS: FULL  DIET: resume prior home diet     Brief Hospitalization Summary: Please see all hospital notes, images, labs for full details of the hospitalization. ADMISSION HPI:  82 year old male with a history of hypertension, hyperlipidemia, diabetes mellitus type 2, coronary artery disease status post BMS to the RCA 2007 and DES to OM2 in August 2017, chronic atrial fibrillation on apixaban, tobacco abuse, CKD stage III, AAA presenting with 2 months of worsening shortness of breath.  The patient states that he is short of breath all the time, but in the last 2 months he has noted increasing dyspnea on exertion and decreased exercise tolerance.  He came to the emergency department on 04/23/2022.  He was given furosemide, IV steroids, and discharged home with a prescription for prednisone.  Apparently he never picked up the prednisone.  Nevertheless, he continues to smoke 3 to 4 cigarettes/day.  His spouse also smokes in the house.  He complains of a chronic cough but denies any hemoptysis.  It is mostly nonproductive.  He denies any worsening lower extremity edema or increasing abdominal girth.  He weighs himself every day.  He states that his weight has been stable for the last 2 months.  He states that his weight usually ranges from 161 to 163 pounds.  He is compliant with his furosemide.  He stated  that after his ED visit he had 1 day where his breathing was pretty good, but it worsened once again which caused him to come to the emergency department.  He denies any fevers, chills, headache, neck pain, chest pain, abdominal pain, dysuria, hematuria, hematochezia, melena.  Because of his weight and shortness of breath EMS was activated.  He was noted to have oxygen saturation in the 80s by EMS.   In the ED, the patient was afebrile hemodynamically stable.  He was tachycardic in the 120s.  Blood pressure was elevated at 170/130.  Oxygen saturation was 93% on 2 L.  WBC 15.8, hemoglobin 12.7, platelets 242,000.  Sodium 143, potassium 3.2, bicarbonate 27, serum creatinine 1.53.  LFTs unremarkable.  WBC 15.8, hemoglobin 12.7, platelets 242,000.  CT chest shows ill-defined nodular opacities in the right lung, the largest 12 x 9 mm and right upper lobe.  There is minimal left pleural effusion.  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   HOSPITAL COURSE BY PROBLEM LIST   Acute respiratory failure with hypoxia -Secondary to pneumonia and COPD exacerbation -Wean oxygen for saturation 88-92% -3/25 CT chest as discussed above -3/26 VBG--7.45/44/50/30   COPD exacerbation -completed Brovana -completed pulmicort -continue Duonebs -treated with IV solumedrol -STOPPED STEROIDS DUE TO SEVERE DELIRIUM    Lobar pneumonia - RULED OUT  -completed ceftriaxone and doxy -check PCT <0.10 -CXR 3/29 does not show pneumonia   Permanent atrial fibrillation with RVR -continue apixaban -continue diltiazem drip>>transitioned to po diltiazem -incited by COPD exacerbation -  HR controlled with diltiazem CD 360 mg daily with metoprolol 100 mg BID   CKD 3b -baseline creatinine 1.5-1.8   Chronic HFpEF -05/11/20 echo--EF 50-55%, no WMA, trivial MR -clinically euvolemic presently   CAD -no chest pain -continue statin and metoprolol   History AAA repair -AAA  endovascular stent grafting by Dr. Trula Slade in June 2018    Type 2 DM with steroid induced hypergycemia, uncontrolled   -hold metformin, resume at discharge  -novolog sliding scale>>increase to resistant scale -elevated CBGs due to steroids -reduced semglee to 20 units daily -check A1C--6.04 Jan 2022   Essential Hypertension -continue metoprolol 100 mg BID  -hold olmesartan to allow BP margin for diltiazem titration -tolerating well cardizem CD 360 mg daily    Hyperlipidemia -continue statin   Tobacco abuse -tobacco cessation advised   Hypokalemia -repleted -mag 1.9   Severe Acute Hospital delirium - RESOLVED NOW  -multifactorial including high dose IV steroids, acute medical condition, low B12 -B12--74>>replete -folate--5.8>>replete -UA neg for pyuria -ammonia 13 -STOPPED STEROIDS  -delirium precautions -family remains at bedside -given severe symptoms trial of seroquel 12.5 mg TID until resolution of sxs -if no improvement may need to transfer to SDU and start IV precedex infusion   Severe Vitamin B12 Deficiency  - started IM B12 injection daily (3 doses given in hospital)  - continue daily oral B12 tabs - follow up with PCP to determine if he might require monthly B12 injections       Discharge Diagnoses:  Principal Problem:   Acute respiratory failure with hypoxia and hypercarbia (HCC) Active Problems:   COPD with acute exacerbation (HCC)   Atrial fibrillation with RVR (HCC)   Permanent atrial fibrillation (HCC)   Lobar pneumonia (HCC)   severe B12 deficiency   Folate deficiency   severe hospital /ICU Delirium   Chronic hypoxic respiratory failure, on home oxygen therapy [facility])   Discharge Instructions:  Allergies as of 05/02/2022       Reactions   Contrast Media [iodinated Contrast Media] Anaphylaxis        Medication List     STOP taking these medications    olmesartan 5 MG tablet Commonly known as: BENICAR   predniSONE 50 MG  tablet Commonly known as: DELTASONE       TAKE these medications    Accu-Chek Aviva Plus test strip Generic drug: glucose blood CHECK BLOOD SUGAR ONCE DAILY. DX E11.51   atorvastatin 40 MG tablet Commonly known as: LIPITOR TAKE ONE TABLET BY MOUTH ONCE DAILY   cyanocobalamin 1000 MCG tablet Commonly known as: VITAMIN B12 Take 1 tablet (1,000 mcg total) by mouth daily.   Dexcom G6 Receiver Devi CHECK VLOOD SUGAR AS DIRECTED AS DIRECTED   Dexcom G6 Sensor Misc APPLY 1 SENSOR EVERY 10 DAYS   Dexcom G6 Transmitter Misc APPLY TO SENSOR EVERY 90 DAYS   dextromethorphan 30 MG/5ML liquid Commonly known as: Delsym Take 5 mLs (30 mg total) by mouth 2 (two) times daily as needed for cough.   diltiazem 360 MG 24 hr capsule Commonly known as: CARDIZEM CD Take 1 capsule (360 mg total) by mouth daily. What changed:  medication strength how much to take how to take this when to take this additional instructions   Eliquis 2.5 MG Tabs tablet Generic drug: apixaban TAKE ONE TABLET BY MOUTH AT BREAKFAST AND AT BEDTIME What changed: See the new instructions.   folic acid 1 MG tablet Commonly known as: FOLVITE Take 1 tablet (1 mg total) by mouth  daily. Start taking on: May 03, 2022   furosemide 80 MG tablet Commonly known as: LASIX TAKE ONE TABLET BY MOUTH ONCE DAILY   guaiFENesin 600 MG 12 hr tablet Commonly known as: MUCINEX Take 1 tablet (600 mg total) by mouth 2 (two) times daily for 3 days.   ipratropium-albuterol 0.5-2.5 (3) MG/3ML Soln Commonly known as: DUONEB Take 3 mLs by nebulization every 4 (four) hours as needed (wheezing, shortness of breath, cough).   isosorbide mononitrate 30 MG 24 hr tablet Commonly known as: IMDUR Take 1 tablet (30 mg total) by mouth daily.   levalbuterol 45 MCG/ACT inhaler Commonly known as: XOPENEX HFA INHALE 1 TO 2 PUFFS BY MOUTH EVERY 6 HOURS AS NEEDED FOR WHEEZE   metFORMIN 1000 MG tablet Commonly known as: GLUCOPHAGE TAKE  ONE TABLET BY MOUTH AT BREAKFAST AND AT BEDTIME   metoprolol tartrate 100 MG tablet Commonly known as: LOPRESSOR TAKE ONE TABLET BY MOUTH AT BREAKFAST AND AT BEDTIME   nitroGLYCERIN 0.4 MG SL tablet Commonly known as: NITROSTAT Place 1 tablet (0.4 mg total) under the tongue every 5 (five) minutes as needed for chest pain. X 3 doses   polyethylene glycol 17 g packet Commonly known as: MIRALAX / GLYCOLAX Take 17 g by mouth daily as needed for mild constipation.   potassium chloride SA 20 MEQ tablet Commonly known as: KLOR-CON M Take 1 tablet (20 mEq total) by mouth every other day. Start taking on: May 03, 2022   Stiolto Respimat 2.5-2.5 MCG/ACT Aers Generic drug: Tiotropium Bromide-Olodaterol Inhale 2 puffs into the lungs daily.   [name] FlexTouch 100 UNIT/ML FlexTouch Pen Generic drug: insulin degludec Inject 25 Units into the skin at bedtime. What changed: See the new instructions.               Durable Medical Equipment  (From admission, onward)           Start     Ordered   05/02/22 1410  For home use only DME Nebulizer machine  Once       Question Answer Comment  Patient needs a nebulizer to treat with the following condition COPD (chronic obstructive pulmonary disease)   Patient needs a nebulizer to treat with the following condition COPD with acute exacerbation   Length of Need Lifetime      05/02/22 1409   05/02/22 1355  For home use only DME oxygen  Once       Comments: POC for COPD  Question Answer Comment  Length of Need Lifetime   Mode or (Route) Nasal cannula   Liters per Minute 2   Frequency Continuous (stationary and portable oxygen unit needed)   Oxygen conserving device Yes   Oxygen delivery system Gas      05/02/22 1355            Follow-up Information     [facility] [place] Follow up.   Specialty: Home Health Services Why: Agency to contact for first visit. Contact information: [address] [place]  [address] [phone]         [name], [name], MD. Schedule an appointment as soon as possible for a visit in 1 week(s).   Specialty: Internal Medicine Why: Hospital Follow Up Contact information: [place] [address] [phone]                Allergies  Allergen Reactions   Contrast Media [Iodinated Contrast Media] Anaphylaxis   Allergies as  of 05/02/2022       Reactions   Contrast Media [iodinated Contrast Media] Anaphylaxis        Medication List     STOP taking these medications    olmesartan 5 MG tablet Commonly known as: BENICAR   predniSONE 50 MG tablet Commonly known as: DELTASONE       TAKE these medications    Accu-Chek Aviva Plus test strip Generic drug: glucose blood CHECK BLOOD SUGAR ONCE DAILY. DX E11.51   atorvastatin 40 MG tablet Commonly known as: LIPITOR TAKE ONE TABLET BY MOUTH ONCE DAILY   cyanocobalamin 1000 MCG tablet Commonly known as: VITAMIN B12 Take 1 tablet (1,000 mcg total) by mouth daily.   Dexcom G6 Receiver Devi CHECK VLOOD SUGAR AS DIRECTED AS DIRECTED   Dexcom G6 Sensor Misc APPLY 1 SENSOR EVERY 10 DAYS   Dexcom G6 Transmitter Misc APPLY TO SENSOR EVERY 90 DAYS   dextromethorphan 30 MG/5ML liquid Commonly known as: Delsym Take 5 mLs (30 mg total) by mouth 2 (two) times daily as needed for cough.   diltiazem 360 MG 24 hr capsule Commonly known as: CARDIZEM CD Take 1 capsule (360 mg total) by mouth daily. What changed:  medication strength how much to take how to take this when to take this additional instructions   Eliquis 2.5 MG Tabs tablet Generic drug: apixaban TAKE ONE TABLET BY MOUTH AT BREAKFAST AND AT BEDTIME What changed: See the new instructions.   folic acid 1 MG tablet Commonly known as: FOLVITE Take 1 tablet (1 mg total) by mouth daily. Start taking on: May 03, 2022   furosemide 80 MG tablet Commonly known as: LASIX TAKE ONE TABLET BY MOUTH  ONCE DAILY   guaiFENesin 600 MG 12 hr tablet Commonly known as: MUCINEX Take 1 tablet (600 mg total) by mouth 2 (two) times daily for 3 days.   ipratropium-albuterol 0.5-2.5 (3) MG/3ML Soln Commonly known as: DUONEB Take 3 mLs by nebulization every 4 (four) hours as needed (wheezing, shortness of breath, cough).   isosorbide mononitrate 30 MG 24 hr tablet Commonly known as: IMDUR Take 1 tablet (30 mg total) by mouth daily.   levalbuterol 45 MCG/ACT inhaler Commonly known as: XOPENEX HFA INHALE 1 TO 2 PUFFS BY MOUTH EVERY 6 HOURS AS NEEDED FOR WHEEZE   metFORMIN 1000 MG tablet Commonly known as: GLUCOPHAGE TAKE ONE TABLET BY MOUTH AT BREAKFAST AND AT BEDTIME   metoprolol tartrate 100 MG tablet Commonly known as: LOPRESSOR TAKE ONE TABLET BY MOUTH AT BREAKFAST AND AT BEDTIME   nitroGLYCERIN 0.4 MG SL tablet Commonly known as: NITROSTAT Place 1 tablet (0.4 mg total) under the tongue every 5 (five) minutes as needed for chest pain. X 3 doses   polyethylene glycol 17 g packet Commonly known as: MIRALAX / GLYCOLAX Take 17 g by mouth daily as needed for mild constipation.   potassium chloride SA 20 MEQ tablet Commonly known as: KLOR-CON M Take 1 tablet (20 mEq total) by mouth every other day. Start taking on: May 03, 2022   Stiolto Respimat 2.5-2.5 MCG/ACT Aers Generic drug: Tiotropium Bromide-Olodaterol Inhale 2 puffs into the lungs daily.   [name] FlexTouch 100 UNIT/ML FlexTouch Pen Generic drug: insulin degludec Inject 25 Units into the skin at bedtime. What changed: See the new instructions.               Durable Medical Equipment  (From admission, onward)           Start  Ordered   05/02/22 1410  For home use only DME Nebulizer machine  Once       Question Answer Comment  Patient needs a nebulizer to treat with the following condition COPD (chronic obstructive pulmonary disease)   Patient needs a nebulizer to treat with the following condition  COPD with acute exacerbation   Length of Need Lifetime      05/02/22 1409   05/02/22 1355  For home use only DME oxygen  Once       Comments: POC for COPD  Question Answer Comment  Length of Need Lifetime   Mode or (Route) Nasal cannula   Liters per Minute 2   Frequency Continuous (stationary and portable oxygen unit needed)   Oxygen conserving device Yes   Oxygen delivery system Gas      05/02/22 1355            Procedures/Studies: DG Chest Port 1 View  Result Date: 05/01/2022 CLINICAL DATA:  Hypoxia EXAM: PORTABLE CHEST 1 VIEW COMPARISON:  04/29/2022 FINDINGS: The cardio pericardial silhouette is enlarged. Interstitial markings are diffusely coarsened with chronic features. Tiny nodular densities again noted right lung. Interval improvement in basilar aeration with decrease of the tiny pleural effusions seen previously. Prominent skin fold overlies the lateral left lung. Bones are diffusely demineralized. IMPRESSION: 1. Chronic interstitial changes with improved aeration at the bases and decrease in tiny effusions. 2. Tiny nodular densities in the right lung. Electronically Signed   By: Misty Stanley M.D.   On: 05/01/2022 08:35   DG Chest 1 View  Result Date: 04/29/2022 CLINICAL DATA:  Shortness of breath. EXAM: CHEST  1 VIEW COMPARISON:  04/27/2022 FINDINGS: Cardiopericardial silhouette is at upper limits of normal for size. Interstitial markings are diffusely coarsened with chronic features. Subtle nodular densities in the right lung were better evaluated by CT chest yesterday. Minimal atelectasis noted in the lung bases with tiny bilateral pleural effusions. Telemetry leads overlie the chest. IMPRESSION: 1. Tiny bilateral pleural effusions with bibasilar atelectasis. 2. Subtle nodular densities in the right lung were better evaluated by CT chest yesterday. Electronically Signed   By: Misty Stanley M.D.   On: 04/29/2022 06:30   CT Chest Wo Contrast  Result Date:  04/27/2022 CLINICAL DATA:  Shortness of breath. EXAM: CT CHEST WITHOUT CONTRAST TECHNIQUE: Multidetector CT imaging of the chest was performed following the standard protocol without IV contrast. RADIATION DOSE REDUCTION: This exam was performed according to the departmental dose-optimization program which includes automated exposure control, adjustment of the mA and/or kV according to patient size and/or use of iterative reconstruction technique. COMPARISON:  January 15, 2016. FINDINGS: Cardiovascular: Atherosclerosis of thoracic aorta is noted without aneurysm formation. Normal cardiac size. No pericardial effusion. Extensive coronary artery calcifications are noted. Mediastinum/Nodes: No enlarged mediastinal or axillary lymph nodes. Thyroid gland, trachea, and esophagus demonstrate no significant findings. Lungs/Pleura: No pneumothorax is noted. Minimal left pleural effusion is noted. There is interval development of ill-defined patchy nodular opacities in the right lung, the largest measuring 12 x 9 mm on image number 67 of series 4 in the right upper lobe. Emphysematous disease is noted bilaterally. Mild bilateral posterior basilar subsegmental atelectasis is noted. 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 Musculoskeletal: No chest wall mass or suspicious bone lesions identified. IMPRESSION: Interval development of ill-defined patchy nodular opacities in the right lung, the largest measuring 12 x 9 mm in the right upper lobe. While this may  represent multifocal pneumonia or inflammation, metastatic disease or other neoplasm cannot be excluded. Follow-up chest CT in 2-3 weeks is recommended to ensure resolution or stability of these abnormalities. Minimal left pleural effusion is noted. Hepatic cirrhosis. Extensive coronary artery calcifications are noted suggesting coronary artery disease. Probable chronic dissection seen  involving visualized portion of proximal abdominal aorta. Aortic Atherosclerosis (ICD10-I70.0) and Emphysema (ICD10-J43.9). Electronically Signed   By: Marijo Conception M.D.   On: 04/27/2022 09:36   DG Chest Port 1 View  Result Date: 04/27/2022 CLINICAL DATA:  Shortness of breath.  Dyspnea. EXAM: PORTABLE CHEST 1 VIEW COMPARISON:  04/23/2022 FINDINGS: The lungs are clear without focal pneumonia, edema, pneumothorax or pleural effusion. Tiny pulmonary nodule identified right mid lung. Interstitial markings are diffusely coarsened with chronic features. Cardiopericardial silhouette is at upper limits of normal for size. Bones are diffusely demineralized. Telemetry leads overlie the chest. IMPRESSION: Chronic interstitial coarsening without acute cardiopulmonary findings. Tiny pulmonary nodule identified right mid lung. CT chest without contrast recommended to further evaluate. Electronically Signed   By: Misty Stanley M.D.   On: 04/27/2022 07:58   DG Chest 2 View  Result Date: 04/23/2022 CLINICAL DATA:  Shortness of breath EXAM: CHEST - 2 VIEW COMPARISON:  05/10/2020, CT 01/15/2016 FINDINGS: Hyperinflation with emphysema. Trace pleural effusions versus pleural scarring. Normal cardiac size. Aortic atherosclerosis. No acute airspace disease IMPRESSION: Hyperinflation with emphysema. Trace pleural effusions versus pleural scarring. Electronically Signed   By: Donavan Foil M.D.   On: 04/23/2022 21:43   VAS [place] EVAR DUPLEX  Result Date: 04/07/2022 Endovascular Aortic Repair Study (EVAR) Patient Name:  TORBEN KRAWIEC  Date of Exam:   04/07/2022 Medical Rec #: [id number]       Accession #:    [id number] Date of Birth: 11/09/1940       Patient Gender: M Patient Age:   38 years Exam Location:  [facility] Procedure:      VAS [place] EVAR DUPLEX Referring Phys: Karoline Caldwell --------------------------------------------------------------------------------  Indications: Follow up exam for EVAR. Surgery date  07/31/16. Risk Factors: Hypertension, current smoker, prior MI, coronary artery disease. Other Factors: CHF, Hx AAA, Carotid AD. Limitations: Air/bowel gas and obesity.  Comparison Study: 07/31/16 EVAR Performing Technologist: Elta Guadeloupe RVT, RDMS  Examination Guidelines: A complete evaluation includes B-mode imaging, spectral Doppler, color Doppler, and power Doppler as needed of all accessible portions of each vessel. Bilateral testing is considered an integral part of a complete examination. Limited examinations for reoccurring indications may be performed as noted.  Endovascular Aortic Repair (EVAR): +----------+----------------+-------------------+-------------------+           Diameter AP (cm)Diameter Trans (cm)Velocities (cm/sec) +----------+----------------+-------------------+-------------------+ Aorta     3.81            4.34               25                  +----------+----------------+-------------------+-------------------+ Right Limb1.44                               41                  +----------+----------------+-------------------+-------------------+ Left Limb 1.50                               43                  +----------+----------------+-------------------+-------------------+  Summary: Abdominal Aorta: Patent endovascular aneurysm repair with no evidence of endoleak.  *See table(s) above for measurements and observations.  Electronically signed by Monica Martinez MD on 04/07/2022 at 10:00:28 AM.    Final      Subjective: Pt more clear headed, alert, cooperative, No distress, no complaints other than wanting to go home.   Discharge Exam: Vitals:   05/02/22 0921 05/02/22 1329  BP: (!) 150/93 114/69  Pulse: 78 66  Resp:  (!) 24  Temp:  97.8 F (36.6 C)  SpO2:     Vitals:   05/02/22 0612 05/02/22 0906 05/02/22 0921 05/02/22 1329  BP: (!) 153/92  (!) 150/93 114/69  Pulse: 74  78 66  Resp: 16   (!) 24  Temp: 98.2 F (36.8 C)   97.8 F (36.6 C)  TempSrc:  Oral   Oral  SpO2: 97% (!) 89%    Weight:      Height:       General:  Pt is alert,having acute delirium symptoms, Not in acute distress HEENT: No icterus, No thrush, No neck mass, [place]/AT Cardiovascular: IRRR, S1/S2, no rubs, no gallops Respiratory: BBS heard, no increased WOB, exp wheeze heard bilaterally Abdomen: Soft/+BS, non tender, non distended, no guarding Extremities: No edema, No lymphangitis, No petechiae, No rashes, no synovitis   The results of significant diagnostics from this hospitalization (including imaging, microbiology, ancillary and laboratory) are listed below for reference.     Microbiology: Recent Results (from the past 240 hour(s))  Resp panel by RT-PCR (RSV, Flu A&B, Covid) Anterior Nasal Swab     Status: None   Collection Time: 04/27/22  7:03 AM   Specimen: Anterior Nasal Swab  Result Value Ref Range Status   SARS Coronavirus 2 by RT PCR NEGATIVE NEGATIVE Final    Comment: (NOTE) SARS-CoV-2 target nucleic acids are NOT DETECTED.  The SARS-CoV-2 RNA is generally detectable in upper respiratory specimens during the acute phase of infection. The lowest concentration of SARS-CoV-2 viral copies this assay can detect is 138 copies/mL. A negative result does not preclude SARS-Cov-2 infection and should not be used as the sole basis for treatment or other patient management decisions. A negative result may occur with  improper specimen collection/handling, submission of specimen other than nasopharyngeal swab, presence of viral mutation(s) within the areas targeted by this assay, and inadequate number of viral copies(<138 copies/mL). A negative result must be combined with clinical observations, patient history, and epidemiological information. The expected result is Negative.  Fact Sheet for Patients:  EntrepreneurPulse.com.au  Fact Sheet for Healthcare Providers:  IncredibleEmployment.be  This test is no t yet approved  or cleared by the [place] FDA and  has been authorized for detection and/or diagnosis of SARS-CoV-2 by FDA under an Emergency Use Authorization (EUA). This EUA will remain  in effect (meaning this test can be used) for the duration of the COVID-19 declaration under Section 564(b)(1) of the Act, 21 U.S.C.section 360bbb-3(b)(1), unless the authorization is terminated  or revoked sooner.       Influenza A by PCR NEGATIVE NEGATIVE Final   Influenza B by PCR NEGATIVE NEGATIVE Final    Comment: (NOTE) The Xpert Xpress SARS-CoV-2/FLU/RSV plus assay is intended as an aid in the diagnosis of influenza from Nasopharyngeal swab specimens and should not be used as a sole basis for treatment. Nasal washings and aspirates are unacceptable for Xpert Xpress SARS-CoV-2/FLU/RSV testing.  Fact Sheet for Patients: EntrepreneurPulse.com.au  Fact Sheet for Healthcare Providers: IncredibleEmployment.be  This  test is not yet approved or cleared by the [place] and has been authorized for detection and/or diagnosis of SARS-CoV-2 by FDA under an Emergency Use Authorization (EUA). This EUA will remain in effect (meaning this test can be used) for the duration of the COVID-19 declaration under Section 564(b)(1) of the Act, 21 U.S.C. section 360bbb-3(b)(1), unless the authorization is terminated or revoked.     Resp Syncytial Virus by PCR NEGATIVE NEGATIVE Final    Comment: (NOTE) Fact Sheet for Patients: EntrepreneurPulse.com.au  Fact Sheet for Healthcare Providers: IncredibleEmployment.be  This test is not yet approved or cleared by the [place] FDA and has been authorized for detection and/or diagnosis of SARS-CoV-2 by FDA under an Emergency Use Authorization (EUA). This EUA will remain in effect (meaning this test can be used) for the duration of the COVID-19 declaration under Section 564(b)(1) of the Act,  21 U.S.C. section 360bbb-3(b)(1), unless the authorization is terminated or revoked.  Performed at [facility], [address], [place], [place] [address]   MRSA Next Gen by PCR, Nasal     Status: None   Collection Time: 04/27/22 12:27 PM   Specimen: Nasal Mucosa; Nasal Swab  Result Value Ref Range Status   MRSA by PCR Next Gen NOT DETECTED NOT DETECTED Final    Comment: (NOTE) The GeneXpert MRSA Assay (FDA approved for NASAL specimens only), is one component of a comprehensive MRSA colonization surveillance program. It is not intended to diagnose MRSA infection nor to guide or monitor treatment for MRSA infections. Test performance is not FDA approved in patients less than 42 years old. Performed at [facility], [address] [place], [place] [address]      Labs: BNP (last 3 results) Recent Labs    04/23/22 2145 04/27/22 0738  BNP 614.0* 99991111*   Basic Metabolic Panel: Recent Labs  Lab 04/27/22 0738 04/28/22 0432 04/28/22 0901 04/29/22 0441 05/01/22 0608  NA 143  --  136 136 146*  K 3.2*  --  3.4* 3.4* 3.7  CL 102  --  98 101 107  CO2 27  --  25 26 29   GLUCOSE 122*  --  401* 262* 138*  BUN 31*  --  33* 50* 48*  CREATININE 1.53*  --  1.57* 1.85* 1.87*  CALCIUM 9.2  --  8.5* 8.6* 9.0  MG 1.9 1.9  --   --  2.4  PHOS  --  3.2  --   --   --    Liver Function Tests: Recent Labs  Lab 04/27/22 0738  AST 24  ALT 20  ALKPHOS 71  BILITOT 0.8  PROT 8.1  ALBUMIN 3.8   No results for input(s): "LIPASE", "AMYLASE" in the last 168 hours. Recent Labs  Lab 04/28/22 0901  AMMONIA 13   CBC: Recent Labs  Lab 04/27/22 0738 04/28/22 0432  WBC 15.8* 11.7*  NEUTROABS 11.4*  --   HGB 12.7* 10.6*  HCT 39.1 32.6*  MCV 91.1 89.8  PLT 242 218   Cardiac Enzymes: No results for input(s): "CKTOTAL", "CKMB", "CKMBINDEX", "TROPONINI" in the last 168 hours. BNP: Invalid input(s): "POCBNP" CBG: Recent Labs  Lab 05/01/22 1616 05/01/22 2104 05/02/22 0256  05/02/22 0726 05/02/22 1106  GLUCAP 62* 249* 207* 177* 249*   D-Dimer No results for input(s): "DDIMER" in the last 72 hours. Hgb A1c Recent Labs    04/29/22 1634  HGBA1C 6.8*   Lipid Profile No results for input(s): "CHOL", "HDL", "LDLCALC", "TRIG", "CHOLHDL", "LDLDIRECT" in  the last 72 hours. Thyroid function studies No results for input(s): "TSH", "T4TOTAL", "T3FREE", "THYROIDAB" in the last 72 hours.  Invalid input(s): "FREET3" Anemia work up No results for input(s): "VITAMINB12", "FOLATE", "FERRITIN", "TIBC", "IRON", "RETICCTPCT" in the last 72 hours. Urinalysis    Component Value Date/Time   COLORURINE YELLOW 04/28/2022 1057   APPEARANCEUR CLEAR 04/28/2022 1057   LABSPEC 1.017 04/28/2022 1057   PHURINE 5.0 04/28/2022 1057   GLUCOSEU >=500 (A) 04/28/2022 1057   HGBUR SMALL (A) 04/28/2022 1057   HGBUR trace-intact 02/16/2007 1038   BILIRUBINUR NEGATIVE 04/28/2022 1057   BILIRUBINUR n 08/30/2015 1045   KETONESUR NEGATIVE 04/28/2022 1057   PROTEINUR 30 (A) 04/28/2022 1057   UROBILINOGEN 1.0 08/30/2015 1045   UROBILINOGEN 1.0 01/14/2010 0629   NITRITE NEGATIVE 04/28/2022 1057   LEUKOCYTESUR NEGATIVE 04/28/2022 1057   Sepsis Labs Recent Labs  Lab 04/27/22 0738 04/28/22 0432  WBC 15.8* 11.7*   Microbiology Recent Results (from the past 240 hour(s))  Resp panel by RT-PCR (RSV, Flu A&B, Covid) Anterior Nasal Swab     Status: None   Collection Time: 04/27/22  7:03 AM   Specimen: Anterior Nasal Swab  Result Value Ref Range Status   SARS Coronavirus 2 by RT PCR NEGATIVE NEGATIVE Final    Comment: (NOTE) SARS-CoV-2 target nucleic acids are NOT DETECTED.  The SARS-CoV-2 RNA is generally detectable in upper respiratory specimens during the acute phase of infection. The lowest concentration of SARS-CoV-2 viral copies this assay can detect is 138 copies/mL. A negative result does not preclude SARS-Cov-2 infection and should not be used as the sole basis for treatment  or other patient management decisions. A negative result may occur with  improper specimen collection/handling, submission of specimen other than nasopharyngeal swab, presence of viral mutation(s) within the areas targeted by this assay, and inadequate number of viral copies(<138 copies/mL). A negative result must be combined with clinical observations, patient history, and epidemiological information. The expected result is Negative.  Fact Sheet for Patients:  EntrepreneurPulse.com.au  Fact Sheet for Healthcare Providers:  IncredibleEmployment.be  This test is no t yet approved or cleared by the [place] FDA and  has been authorized for detection and/or diagnosis of SARS-CoV-2 by FDA under an Emergency Use Authorization (EUA). This EUA will remain  in effect (meaning this test can be used) for the duration of the COVID-19 declaration under Section 564(b)(1) of the Act, 21 U.S.C.section 360bbb-3(b)(1), unless the authorization is terminated  or revoked sooner.       Influenza A by PCR NEGATIVE NEGATIVE Final   Influenza B by PCR NEGATIVE NEGATIVE Final    Comment: (NOTE) The Xpert Xpress SARS-CoV-2/FLU/RSV plus assay is intended as an aid in the diagnosis of influenza from Nasopharyngeal swab specimens and should not be used as a sole basis for treatment. Nasal washings and aspirates are unacceptable for Xpert Xpress SARS-CoV-2/FLU/RSV testing.  Fact Sheet for Patients: EntrepreneurPulse.com.au  Fact Sheet for Healthcare Providers: IncredibleEmployment.be  This test is not yet approved or cleared by the [place] FDA and has been authorized for detection and/or diagnosis of SARS-CoV-2 by FDA under an Emergency Use Authorization (EUA). This EUA will remain in effect (meaning this test can be used) for the duration of the COVID-19 declaration under Section 564(b)(1) of the Act, 21 U.S.C. section  360bbb-3(b)(1), unless the authorization is terminated or revoked.     Resp Syncytial Virus by PCR NEGATIVE NEGATIVE Final    Comment: (NOTE) Fact Sheet for Patients: EntrepreneurPulse.com.au  Fact Sheet for Healthcare Providers: IncredibleEmployment.be  This test is not yet approved or cleared by the [place] FDA and has been authorized for detection and/or diagnosis of SARS-CoV-2 by FDA under an Emergency Use Authorization (EUA). This EUA will remain in effect (meaning this test can be used) for the duration of the COVID-19 declaration under Section 564(b)(1) of the Act, 21 U.S.C. section 360bbb-3(b)(1), unless the authorization is terminated or revoked.  Performed at [facility], [address] [place], [place] [address]   MRSA Next Gen by PCR, Nasal     Status: None   Collection Time: 04/27/22 12:27 PM   Specimen: Nasal Mucosa; Nasal Swab  Result Value Ref Range Status   MRSA by PCR Next Gen NOT DETECTED NOT DETECTED Final    Comment: (NOTE) The GeneXpert MRSA Assay (FDA approved for NASAL specimens only), is one component of a comprehensive MRSA colonization surveillance program. It is not intended to diagnose MRSA infection nor to guide or monitor treatment for MRSA infections. Test performance is not FDA approved in patients less than 46 years old. Performed at [facility], [address], [place], [place] [address]     Time coordinating discharge: 40 mins   SIGNED:  Irwin Brakeman, MD  [facility] 05/02/2022, 2:17 PM How to contact the [facility] Attending or Consulting provider [place] or covering provider during after hours [place], for this patient?  Check the care team in [facility] and look for a) attending/consulting TRH provider listed and b) the [facility] team listed Log into [email or web address] and use [place]'s universal password to access. If you do not have the password, please contact the hospital operator. Locate the [facility]  provider you are looking for under [facility] and page to a number that you can be directly reached. If you still have difficulty reaching the provider, please page the [facility] (Director on Call) for the Hospitalists listed on amion for assistance.

## 2022-05-02 NOTE — Evaluation (Signed)
Physical Therapy Evaluation Patient Details Name: Jason Fisher MRN: [id number] DOB: 04/27/1940 Today's Date: 05/02/2022  History of Present Illness  Jason Fisher is a 82 year old male with a history of hypertension, hyperlipidemia, diabetes mellitus type 2, coronary artery disease status post BMS to the RCA 2007 and DES to OM2 in August 2017, chronic atrial fibrillation on apixaban, tobacco abuse, CKD stage III, AAA presenting with 2 months of worsening shortness of breath.  The patient states that he is short of breath all the time, but in the last 2 months he has noted increasing dyspnea on exertion and decreased exercise tolerance.  He came to the emergency department on 04/23/2022.  He was given furosemide, IV steroids, and discharged home with a prescription for prednisone.  Apparently he never picked up the prednisone.  Nevertheless, he continues to smoke 3 to 4 cigarettes/day.  His spouse also smokes in the house.  He complains of a chronic cough but denies any hemoptysis.  It is mostly nonproductive.  He denies any worsening lower extremity edema or increasing abdominal girth.  He weighs himself every day.  He states that his weight has been stable for the last 2 months.  He states that his weight usually ranges from 161 to 163 pounds.  He is compliant with his furosemide.  He stated that after his ED visit he had 1 day where his breathing was pretty good, but it worsened once again which caused him to come to the emergency department.  He denies any fevers, chills, headache, neck pain, chest pain, abdominal pain, dysuria, hematuria, hematochezia, melena.  Because of his weight and shortness of breath EMS was activated.  He was noted to have oxygen saturation in the 80s by EMS.    Clinical Impression  Patient sitting up in chair on therapist arrival (assisted to chair by nursing).  His son and wife present at bedside.  Patient on 2 Liters of O2 and O2 saturation 96-98%.  PT took O2 off and  patient O2 saturation remains at 90% and above with standing to RW with CGA and then a short walk in the room.  PT then assisted patient with RW and CGA out into the hallway and he walked x 50 ft with CGA and no O2; however on return to his room and sitting in the chair, he started to desaturate to 86% and so PT put his O2 back on and notified nursing of all above.  PT left patient in chair with chair alarm set and family in room; nurse call button in reach.  Patient will benefit from continued skilled therapy services during the remainder of his hospital stay and at the next recommended venue of care to address deficits and promote return to optimal function.           Recommendations for follow up therapy are one component of a multi-disciplinary discharge planning process, led by the attending physician.  Recommendations may be updated based on patient status, additional functional criteria and insurance authorization.  Follow Up Recommendations       Assistance Recommended at Discharge Intermittent Supervision/Assistance  Patient can return home with the following  A little help with walking and/or transfers;A little help with bathing/dressing/bathroom;Help with stairs or ramp for entrance    Equipment Recommendations None recommended by PT  Recommendations for Other Services       Functional Status Assessment Patient has had a recent decline in their functional status and demonstrates the ability to make significant improvements in  function in a reasonable and predictable amount of time.     Precautions / Restrictions Precautions Precautions: Fall Restrictions Weight Bearing Restrictions: No      Mobility  Bed Mobility Overal bed mobility: Modified Independent, Needs Assistance             General bed mobility comments: assisted from bed by nursing Patient Response: Cooperative, Flat affect  Transfers Overall transfer level: Needs assistance Equipment used: Rolling  walker (2 wheels) Transfers: Sit to/from Stand Sit to Stand: Min guard           General transfer comment: sit to stand from chair to RW with minimal guard assist    Ambulation/Gait Ambulation/Gait assistance: Min guard Gait Distance (Feet): 50 Feet Assistive device: Rolling walker (2 wheels) Gait Pattern/deviations: Decreased step length - right, Decreased step length - left       General Gait Details: decreased gait speed  Stairs            Wheelchair Mobility    Modified Rankin (Stroke Patients Only)       Balance Overall balance assessment: Needs assistance Sitting-balance support: Feet supported Sitting balance-Leahy Scale: Good Sitting balance - Comments: good sitting balance on edge of chair with feet supported   Standing balance support: Bilateral upper extremity supported, Reliant on assistive device for balance, During functional activity Standing balance-Leahy Scale: Good Standing balance comment: fair to good standing balance with RW and PT contact guard assist for safety                             Pertinent Vitals/Pain Pain Assessment Pain Assessment: No/denies pain    Home Living Family/patient expects to be discharged to:: Private residence Living Arrangements: Spouse/significant other Available Help at Discharge: Family;Available 24 hours/day Type of Home: House Home Access: Stairs to enter   [employer] of Steps: 4 to 8   Home Layout: One level Home Equipment: Shower seat;Electric scooter;Cane - single point;Rollator (4 wheels);Rolling Walker (2 wheels)      Prior Function Prior Level of Function : Independent/Modified Independent                     Hand Dominance   Dominant Hand: Right    Extremity/Trunk Assessment   Upper Extremity Assessment Upper Extremity Assessment: Generalized weakness    Lower Extremity Assessment Lower Extremity Assessment: Generalized weakness    Cervical /  Trunk Assessment Cervical / Trunk Assessment: Normal  Communication   Communication: No difficulties  Cognition Arousal/Alertness: Awake/alert Behavior During Therapy: WFL for tasks assessed/performed Overall Cognitive Status: Within Functional Limits for tasks assessed                                          General Comments      Exercises     Assessment/Plan    PT Assessment Patient needs continued PT services  PT Problem List Decreased strength;Decreased activity tolerance;Decreased balance;Decreased mobility;Cardiopulmonary status limiting activity       PT Treatment Interventions Balance training;Gait training;Neuromuscular re-education;Functional mobility training;Patient/family education;Stair training;Therapeutic activities;Therapeutic exercise    PT Goals (Current goals can be found in the Care Plan section)  Acute Rehab PT Goals Patient Stated Goal: return home PT Goal Formulation: With patient/family Time For Goal Achievement: 05/16/22 Potential to Achieve Goals: Good    Frequency Min 2X/week  Co-evaluation               AM-PAC PT "6 Clicks" Mobility  Outcome Measure Help needed turning from your back to your side while in a flat bed without using bedrails?: A Little Help needed moving from lying on your back to sitting on the side of a flat bed without using bedrails?: A Little Help needed moving to and from a bed to a chair (including a wheelchair)?: A Little Help needed standing up from a chair using your arms (e.g., wheelchair or bedside chair)?: A Little Help needed to walk in hospital room?: A Little Help needed climbing 3-5 steps with a railing? : A Lot 6 Click Score: 17    End of Session Equipment Utilized During Treatment: Oxygen Activity Tolerance: Patient tolerated treatment well Patient left: in chair;with call bell/phone within reach;with chair alarm set;with family/visitor present Nurse Communication: Mobility  status;Other (comment) (took O2 off for therapy assessment; O2 saturation initially at 94% and above but after walking desaturated to 86% so put his O2 back on and notified nursing.) PT Visit Diagnosis: Muscle weakness (generalized) (M62.81);Unsteadiness on feet (R26.81)    Time: [id number] PT Time Calculation (min) (ACUTE ONLY): 20 min   Charges:   PT Evaluation $PT Eval Low Complexity: 1 Low          10:33 AM, 05/02/22 Cyla Haluska Small Alizae Bechtel MPT [place] [facility] [place] [phone]

## 2022-05-04 ENCOUNTER — Telehealth: Payer: Self-pay | Admitting: Internal Medicine

## 2022-05-04 ENCOUNTER — Ambulatory Visit: Payer: Self-pay

## 2022-05-04 ENCOUNTER — Telehealth: Payer: Self-pay

## 2022-05-04 NOTE — Transitions of Care (Post Inpatient/ED Visit) (Signed)
   05/04/2022  Name: YVETTE EVELYN MRN: [id number] DOB: [birth year]  Today's TOC FU Call Status: Today's TOC FU Call Status:: Unsuccessul Call (1st Attempt) Unsuccessful Call (1st Attempt) Date: 05/04/22  Attempted to reach the patient regarding the most recent Inpatient/ED visit.  Follow Up Plan: Additional outreach attempts will be made to reach the patient to complete the Transitions of Care (Post Inpatient/ED visit) call.     Enzo Montgomery, RN,BSN,CCM [facility] Care Management Care Management Community Coordinator Direct Phone: [phone] Toll Free: [phone] Fax: [phone]

## 2022-05-04 NOTE — Telephone Encounter (Signed)
Pt was d/c from [facility] on 05-02-2022 and need hospital fup in 2 wks. Md does not have any openings. Please advise

## 2022-05-04 NOTE — Telephone Encounter (Signed)
Pt has been sch in same day slot ok per rachel for 05-14-2022

## 2022-05-04 NOTE — Chronic Care Management (AMB) (Signed)
   05/04/2022  KAMARIE KAMMEYER 06-17-1940 [id number]   Reason for Encounter: Patient is not currently enrolled in the CCM program. CCM status changed to previously enrolled.   Horris Latino RN Care Manager/Chronic Care Management [phone]

## 2022-05-05 ENCOUNTER — Telehealth: Payer: Self-pay

## 2022-05-05 NOTE — Transitions of Care (Post Inpatient/ED Visit) (Signed)
   05/05/2022  Name: Jason Fisher MRN: [id number] DOB: 02-15-1940  Today's TOC FU Call Status: Today's TOC FU Call Status:: Successful TOC FU Call Competed TOC FU Call Complete Date: 05/05/22  Transition Care Management Follow-up Telephone Call Date of Discharge: 05/02/22 [facility] Type of Discharge: Inpatient Admission Primary Inpatient Discharge Diagnosis:: "acute resp failure" How have you been since you were released from the hospital?: Better (Call completed with spouse. She states patient 'doing really good.' He is using oxygen prn. Pulse ox ranging around 94%. He was able to get out of house yesterday and run errands and did well.) Any questions or concerns?: No  Items Reviewed: Did you receive and understand the discharge instructions provided?: Yes Medications obtained and verified?: Yes (Medications Reviewed) Any new allergies since your discharge?: No Dietary orders reviewed?: Yes Type of Diet Ordered:: low salt/heart healthy Do you have support at home?: Yes People in Home: spouse Name of Support/Comfort Primary Source: [facility] and Equipment/Supplies: [place] Ordered?: Yes Name of [place]:: [place] set up a time to come to your home?: No (Spouse states pt does not answer his cell and listen to messages-thinks agency probably called him on cell. She will call agency today to follow up on scheduling home visit) Any new equipment or medical supplies ordered?: Yes Name of Medical supply agency?: [employer] Were you able to get the equipment/medical supplies?: Yes Do you have any questions related to the use of the equipment/supplies?: No  Functional Questionnaire: Do you need assistance with bathing/showering or dressing?: No Do you need assistance with meal preparation?: No Do you need assistance with eating?: No Do you have difficulty maintaining continence: No Do you need assistance  with getting out of bed/getting out of a chair/moving?: No Do you have difficulty managing or taking your medications?: No  Follow up appointments reviewed: PCP Follow-up appointment confirmed?: Yes Date of PCP follow-up appointment?: 05/14/22 Follow-up Provider: Dr. [name] Specialist [facility] Follow-up appointment confirmed?: NA Do you need transportation to your follow-up appointment?: No Do you understand care options if your condition(s) worsen?: Yes-patient verbalized understanding  SDOH Interventions Today    Flowsheet Row Most Recent Value  SDOH Interventions   Food Insecurity Interventions Intervention Not Indicated  Transportation Interventions Intervention Not Indicated      TOC Interventions Today    Flowsheet Row Most Recent Value  TOC Interventions   TOC Interventions Discussed/Reviewed TOC Interventions Discussed, Post discharge activity limitations per provider      Interventions Today    Flowsheet Row Most Recent Value  General Interventions   General Interventions Discussed/Reviewed General Interventions Discussed, Doctor Visits  Doctor Visits Discussed/Reviewed Doctor Visits Discussed, PCP  PCP/Specialist Visits Compliance with follow-up visit  Education Interventions   Education Provided Provided Education  Provided Verbal Education On Nutrition, Medication, When to see the doctor, Other  [oxygen therapy, resp sx mgmt]  Nutrition Interventions   Nutrition Discussed/Reviewed Nutrition Discussed, Adding fruits and vegetables, Decreasing salt  Pharmacy Interventions   Pharmacy Dicussed/Reviewed Pharmacy Topics Discussed, Medications and their functions  Safety Interventions   Safety Discussed/Reviewed Safety Discussed        Hetty Blend [facility] Care Management Care Management Community Coordinator Direct Phone: [phone] Toll Free: [phone] Fax: [phone]

## 2022-05-12 ENCOUNTER — Telehealth: Payer: Self-pay | Admitting: Internal Medicine

## 2022-05-12 NOTE — Telephone Encounter (Addendum)
Corwin Levins - PT with [facility]  [phone]  Delay in Care: Calling to inform MD that she cannot seem to get a hold of Pt on the phone to schedule PT.   Pt's phone numbers verified. PT will keep trying to reach Pt.  Pt has been rescheduled for Friday, 05/15/22 - Pending confirmation from Pt.

## 2022-05-14 ENCOUNTER — Ambulatory Visit (INDEPENDENT_AMBULATORY_CARE_PROVIDER_SITE_OTHER): Payer: Medicare Other | Admitting: Internal Medicine

## 2022-05-14 ENCOUNTER — Encounter: Payer: Self-pay | Admitting: Internal Medicine

## 2022-05-14 VITALS — BP 120/88 | HR 76 | Temp 97.6°F | Wt 169.5 lb

## 2022-05-14 DIAGNOSIS — J441 Chronic obstructive pulmonary disease with (acute) exacerbation: Secondary | ICD-10-CM

## 2022-05-14 DIAGNOSIS — R410 Disorientation, unspecified: Secondary | ICD-10-CM | POA: Diagnosis not present

## 2022-05-14 DIAGNOSIS — Z09 Encounter for follow-up examination after completed treatment for conditions other than malignant neoplasm: Secondary | ICD-10-CM

## 2022-05-14 DIAGNOSIS — J181 Lobar pneumonia, unspecified organism: Secondary | ICD-10-CM

## 2022-05-14 DIAGNOSIS — R4189 Other symptoms and signs involving cognitive functions and awareness: Secondary | ICD-10-CM | POA: Diagnosis not present

## 2022-05-14 MED ORDER — QUETIAPINE FUMARATE 25 MG PO TABS: 12.5000 mg | ORAL_TABLET | Freq: Every day | ORAL | 1 refills | Status: DC

## 2022-05-14 NOTE — Progress Notes (Signed)
Hospital follow-up visit     CC/Reason for Visit: Hospitalization follow-up  HPI: Jason Fisher is a 82 y.o. male who is coming in today for the above mentioned reasons, specifically transitional care services face-to-face visit.    Dates hospitalized: 04/27/2022-05/02/2022 Days since discharge from hospital: 12 Patient was discharged from the hospital to: Home Reason for admission to hospital: Hypoxemic respiratory failure Date of interactive phone contact with patient and/or caregiver: First attempt 4/1, completed 4/2  I have reviewed in detail patient's discharge summary plus pertinent specific notes, labs, and images from the hospitalization.  Yes  On day of admission patient exhibited severe shortness of breath and dyspnea with exertion.  He was found to be hypoxemic.  It was thought to be related to lobar pneumonia and COPD with acute exacerbation.  He was placed on antibiotics and steroids.  His lung function and hypoxemia slowly improved.  Unfortunately he developed severe hospital-acquired delirium with behavioral disturbances and hallucinations.  This has continued since discharge.  His wife and son mention how he has been having visual hallucinations, bugs on his arms and on the wall, seeing shadows of people who are not there that are truly not.  He has become aggressive and combative at times.  Gets upset at his wife because he believes that she is throwing away his stuff.  Today also note how he has been asking for strange things.  Like spoons when he is not eating.  He has been noted to have some memory loss prior to this hospital stay.  He was diagnosed with B12 deficiency and needs to start monthly IM supplementation.  Medication reconciliation was done today and patient is taking meds as recommended by discharging hospitalist/specialist.  Yes   Past Medical/Surgical History: Past Medical History:  Diagnosis Date   ABDOMINAL AORTIC ANEURYSM 07/13/2008   BENIGN  PROSTATIC HYPERTROPHY, HX OF 02/24/2007   CAD S/P PCI    a. 2007 Inf STEMI s/p Vision BMS  2 to RCA;  b. 2009 ISR Prox RCA Stent-->with DES;  c. 09/2015 PCI: LM nl, LAD 30p/m, D1 90, LCX nl, OM1 small, 80, OM2 90 (2.5x14 Biofreedom stent), OM3 small, RCA 30p ISR, 34m ISR, 40m, 85d, EF 50-55%.   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 On Eliquis (CHA2DS2VASc = 5).  Rate controlled w/ BB and CCB.   Chronic diastolic CHF (congestive heart failure)    a. 09/2015 Echo: EF 55-60%, mildly dil LA.   CKD (chronic kidney disease), stage III    they mentioned this to the patient but they would work on it later after the aneurysm   COLONIC POLYPS, HX OF 11/02/2006   COPD (chronic obstructive pulmonary disease)    DIABETES MELLITUS, TYPE II 10/29/2006   Diverticulosis    History of tobacco abuse    a. Quit 09/2015.   HYPERLIPIDEMIA 10/29/2006   Hypertension    Hypertensive heart disease with heart failure    Pneumonia 09/2015   [name] 07/31/2016   STEMI (ST elevation myocardial infarction) 2007   /notes 07/31/2016    Past Surgical History:  Procedure Laterality Date   ABDOMINAL AORTIC ENDOVASCULAR STENT GRAFT N/A 07/31/2016   Procedure: ABDOMINAL AORTIC ENDOVASCULAR STENT GRAFT;  Surgeon: Nada Libman, MD;  Location: [facility] OR;  Service: Vascular;  Laterality: N/A;   CARDIAC CATHETERIZATION N/A 09/26/2015   Procedure: Left Heart Cath and Coronary Angiography;  Surgeon: Peter M Swaziland, MD;  Location:  [facility] INVASIVE CV LAB;  Service: Cardiovascular;  Laterality: N/A;   CARDIAC CATHETERIZATION N/A 09/26/2015   Procedure: Coronary Stent Intervention;  Surgeon: Peter M SwazilandJordan, MD;  [facility];  Service: Cardiovascular;  Laterality: N/A;   CATARACT EXTRACTION Bilateral    COLONOSCOPY     CORONARY ANGIOPLASTY WITH STENT PLACEMENT  2007   Inferior STEMI 2007 - RCA PCI with 2 Vision BMS; Abnormal Myoview in 2009 - pRCA ins-stent restenosis & unstented  disease - DES PCI Promus 3.0 x 20 mm (3.5 mm)   ENDOVASCULAR REPAIR/STENT GRAFT  07/31/2016   INCISION AND DRAINAGE PERIRECTAL ABSCESS N/A 11/06/2015   Procedure: IRRIGATION AND DEBRIDEMENT PERIRECTAL ABSCESS, POSSIBLE HEMORRHOIDECTOMY;  Surgeon: Abigail Miyamotoouglas Blackman, MD;  Location: [facility] OR;  Service: General;  Laterality: N/A;   ORIF SHOULDER FRACTURE Right    "fell out of back end of a truck"    Social History:  reports that he has been smoking cigarettes. He has a 12.25 pack-year smoking history. He has been exposed to tobacco smoke. He has never used smokeless tobacco. He reports that he does not drink alcohol and does not use drugs.  Allergies: Allergies  Allergen Reactions   Contrast Media [Iodinated Contrast Media] Anaphylaxis    Family History:  Family History  Problem Relation Age of Onset   Heart attack Mother    Diabetes Mother    Heart attack Father    Hypertension Father    Heart attack Brother    Diabetes Brother      Current Outpatient Medications:    ACCU-CHEK AVIVA PLUS test strip, CHECK BLOOD SUGAR ONCE DAILY. DX E11.51, Disp: 100 strip, Rfl: 2   apixaban (ELIQUIS) 2.5 MG TABS tablet, TAKE ONE TABLET BY MOUTH AT BREAKFAST AND AT BEDTIME, Disp: 180 tablet, Rfl: 1   atorvastatin (LIPITOR) 40 MG tablet, TAKE ONE TABLET BY MOUTH ONCE DAILY, Disp: 90 tablet, Rfl: 1   Continuous Blood Gluc Receiver (DEXCOM G6 RECEIVER) DEVI, CHECK VLOOD SUGAR AS DIRECTED AS DIRECTED, Disp: 1 each, Rfl: 0   Continuous Blood Gluc Sensor (DEXCOM G6 SENSOR) MISC, APPLY 1 SENSOR EVERY 10 DAYS, Disp: 3 each, Rfl: 2   Continuous Blood Gluc Transmit (DEXCOM G6 TRANSMITTER) MISC, APPLY TO SENSOR EVERY 90 DAYS, Disp: 1 each, Rfl: 0   cyanocobalamin (VITAMIN B12) 1000 MCG tablet, Take 1 tablet (1,000 mcg total) by mouth daily., Disp: 30 tablet, Rfl: 2   dextromethorphan (DELSYM) 30 MG/5ML liquid, Take 5 mLs (30 mg total) by mouth 2 (two) times daily as needed for cough., Disp: 89 mL, Rfl: 1    diltiazem (CARDIZEM CD) 360 MG 24 hr capsule, Take 1 capsule (360 mg total) by mouth daily., Disp: 30 capsule, Rfl: 1   folic acid (FOLVITE) 1 MG tablet, Take 1 tablet (1 mg total) by mouth daily., Disp: 30 tablet, Rfl: 1   furosemide (LASIX) 80 MG tablet, TAKE ONE TABLET BY MOUTH ONCE DAILY, Disp: 90 tablet, Rfl: 1   insulin degludec (TRESIBA FLEXTOUCH) 100 UNIT/ML FlexTouch Pen, Inject 25 Units into the skin at bedtime., Disp: 15 mL, Rfl: 2   ipratropium-albuterol (DUONEB) 0.5-2.5 (3) MG/3ML SOLN, Take 3 mLs by nebulization every 4 (four) hours as needed (wheezing, shortness of breath, cough)., Disp: 360 mL, Rfl: 1   isosorbide mononitrate (IMDUR) 30 MG 24 hr tablet, Take 1 tablet (30 mg total) by mouth daily., Disp: 90 tablet, Rfl: 3   levalbuterol (XOPENEX HFA) 45 MCG/ACT inhaler, INHALE 1 TO 2 PUFFS BY MOUTH EVERY  6 HOURS AS NEEDED FOR WHEEZE, Disp: 15 each, Rfl: 3   metFORMIN (GLUCOPHAGE) 1000 MG tablet, TAKE ONE TABLET BY MOUTH AT BREAKFAST AND AT BEDTIME, Disp: 180 tablet, Rfl: 1   metoprolol tartrate (LOPRESSOR) 100 MG tablet, TAKE ONE TABLET BY MOUTH AT BREAKFAST AND AT BEDTIME, Disp: 180 tablet, Rfl: 3   nitroGLYCERIN (NITROSTAT) 0.4 MG SL tablet, Place 1 tablet (0.4 mg total) under the tongue every 5 (five) minutes as needed for chest pain. X 3 doses, Disp: 25 tablet, Rfl: 4   polyethylene glycol (MIRALAX / GLYCOLAX) packet, Take 17 g by mouth daily as needed for mild constipation., Disp: , Rfl:    potassium chloride SA (KLOR-CON M) 20 MEQ tablet, Take 1 tablet (20 mEq total) by mouth every other day., Disp: 15 tablet, Rfl: 0   QUEtiapine (SEROQUEL) 25 MG tablet, Take 0.5 tablets (12.5 mg total) by mouth at bedtime., Disp: 30 tablet, Rfl: 1   Tiotropium Bromide-Olodaterol (STIOLTO RESPIMAT) 2.5-2.5 MCG/ACT AERS, Inhale 2 puffs into the lungs daily., Disp: 4 g, Rfl: 5  Review of Systems:  Negative except as mentioned in HPI.    Physical Exam: Vitals:   05/14/22 1539  BP: 120/88   Pulse: 76  Temp: 97.6 F (36.4 C)  TempSrc: Oral  SpO2: 97%  Weight: 169 lb 8 oz (76.9 kg)    Body mass index is 25.77 kg/m.   Physical Exam Vitals reviewed.  Constitutional:      Appearance: Normal appearance.  HENT:     Head: Normocephalic and atraumatic.  Eyes:     Conjunctiva/sclera: Conjunctivae normal.     Pupils: Pupils are equal, round, and reactive to light.  Cardiovascular:     Rate and Rhythm: Normal rate and regular rhythm.  Pulmonary:     Effort: Pulmonary effort is normal.     Breath sounds: Normal breath sounds.  Skin:    General: Skin is warm and dry.  Neurological:     General: No focal deficit present.     Mental Status: He is alert and oriented to person, place, and time.  Psychiatric:        Mood and Affect: Mood normal.        Behavior: Behavior normal.        Thought Content: Thought content normal.        Judgment: Judgment normal.     Impression and Plan:  Hospital discharge follow-up  COPD with acute exacerbation  Lobar pneumonia  severe hospital /ICU Delirium - Plan: Ambulatory referral to Neurology, QUEtiapine (SEROQUEL) 25 MG tablet  Cognitive decline - Plan: Ambulatory referral to Neurology  [facility] charts reviewed in great detail. -Conversation with wife who was present as well as son via video chat. -Start low-dose Seroquel 12.5 mg at bedtime for insomnia and severe behavioral disturbances/hallucinations. -Referral to neurology, suspect he has significant cognitive decline/dementia further worsened by hospital delirium and high-dose steroids. -Wife will schedule monthly nurse visits for IM B12 supplementation.  Medical decision making of high complexity was utilized today.     Chaya Jan, MD [facility]

## 2022-05-15 ENCOUNTER — Telehealth: Payer: Self-pay

## 2022-05-15 ENCOUNTER — Telehealth: Payer: Self-pay | Admitting: Internal Medicine

## 2022-05-15 DIAGNOSIS — N1832 Chronic kidney disease, stage 3b: Secondary | ICD-10-CM | POA: Diagnosis not present

## 2022-05-15 DIAGNOSIS — I255 Ischemic cardiomyopathy: Secondary | ICD-10-CM | POA: Diagnosis not present

## 2022-05-15 DIAGNOSIS — E785 Hyperlipidemia, unspecified: Secondary | ICD-10-CM | POA: Diagnosis not present

## 2022-05-15 DIAGNOSIS — E0951 Drug or chemical induced diabetes mellitus with diabetic peripheral angiopathy without gangrene: Secondary | ICD-10-CM | POA: Diagnosis not present

## 2022-05-15 DIAGNOSIS — J441 Chronic obstructive pulmonary disease with (acute) exacerbation: Secondary | ICD-10-CM | POA: Diagnosis not present

## 2022-05-15 DIAGNOSIS — J9612 Chronic respiratory failure with hypercapnia: Secondary | ICD-10-CM | POA: Diagnosis not present

## 2022-05-15 DIAGNOSIS — E0922 Drug or chemical induced diabetes mellitus with diabetic chronic kidney disease: Secondary | ICD-10-CM | POA: Diagnosis not present

## 2022-05-15 DIAGNOSIS — Z794 Long term (current) use of insulin: Secondary | ICD-10-CM | POA: Diagnosis not present

## 2022-05-15 DIAGNOSIS — I130 Hypertensive heart and chronic kidney disease with heart failure and stage 1 through stage 4 chronic kidney disease, or unspecified chronic kidney disease: Secondary | ICD-10-CM | POA: Diagnosis not present

## 2022-05-15 DIAGNOSIS — Z955 Presence of coronary angioplasty implant and graft: Secondary | ICD-10-CM | POA: Diagnosis not present

## 2022-05-15 DIAGNOSIS — E0965 Drug or chemical induced diabetes mellitus with hyperglycemia: Secondary | ICD-10-CM | POA: Diagnosis not present

## 2022-05-15 DIAGNOSIS — Z7901 Long term (current) use of anticoagulants: Secondary | ICD-10-CM | POA: Diagnosis not present

## 2022-05-15 DIAGNOSIS — I5043 Acute on chronic combined systolic (congestive) and diastolic (congestive) heart failure: Secondary | ICD-10-CM | POA: Diagnosis not present

## 2022-05-15 DIAGNOSIS — Z86010 Personal history of colonic polyps: Secondary | ICD-10-CM | POA: Diagnosis not present

## 2022-05-15 DIAGNOSIS — Z7951 Long term (current) use of inhaled steroids: Secondary | ICD-10-CM | POA: Diagnosis not present

## 2022-05-15 DIAGNOSIS — J9621 Acute and chronic respiratory failure with hypoxia: Secondary | ICD-10-CM | POA: Diagnosis not present

## 2022-05-15 DIAGNOSIS — Z7984 Long term (current) use of oral hypoglycemic drugs: Secondary | ICD-10-CM | POA: Diagnosis not present

## 2022-05-15 DIAGNOSIS — Z9981 Dependence on supplemental oxygen: Secondary | ICD-10-CM | POA: Diagnosis not present

## 2022-05-15 DIAGNOSIS — I2510 Atherosclerotic heart disease of native coronary artery without angina pectoris: Secondary | ICD-10-CM | POA: Diagnosis not present

## 2022-05-15 DIAGNOSIS — E538 Deficiency of other specified B group vitamins: Secondary | ICD-10-CM | POA: Diagnosis not present

## 2022-05-15 DIAGNOSIS — I4821 Permanent atrial fibrillation: Secondary | ICD-10-CM | POA: Diagnosis not present

## 2022-05-15 DIAGNOSIS — I6529 Occlusion and stenosis of unspecified carotid artery: Secondary | ICD-10-CM | POA: Diagnosis not present

## 2022-05-15 DIAGNOSIS — Z87891 Personal history of nicotine dependence: Secondary | ICD-10-CM | POA: Diagnosis not present

## 2022-05-15 NOTE — Telephone Encounter (Signed)
[facility] PT is calling and need VO for PT 1X1, 2X6 1X3 also need OT/ST evaluation

## 2022-05-15 NOTE — Progress Notes (Signed)
Patient ID: Jason Fisher, male   DOB: [birth year], 82 y.o.   MRN: [id number]  Care Management & Coordination Services Pharmacy Team  Reason for Encounter: General adherence update   Contacted patient for general health update and medication adherence call.  Unsuccessful outreach. Left voicemail for patient to return call.    Chart Updates:  Recent office visits:  05/14/22 Philip Aspen, Limmie Patricia, MD - Patient presented for Hospital discharge follow up and other concerns. Prescribed Seroquel.  Recent consult visits:  04/07/22 Dara Lords, PA-C  (Vascular Surg) - Patient presented for History of abdominal aortic aneurysm repair and other concerns. No medication changes.   Hospital visits:  Medication Reconciliation was completed by comparing discharge summary, patient's EMR and Pharmacy list, and upon discussion with patient.  Patient presented to [facility] on 04/27/22 due to Acute respiratory failure with hypoxia and hypercarbia. Patient was present for 5 days.  New?Medications Started at [facility] Discharge:?? -started  cyanocobalamin (VITAMIN B12) dextromethorphan (Delsym) folic acid (FOLVITE) guaiFENesin (MUCINEX) ipratropium-albuterol (DUONEB) potassium chloride SA (KLOR-CON M)  Medication Changes at Hospital Discharge: -Changed  diltiazem (CARDIZEM CD) Eliquis (apixaban) Evaristo Bury FlexTouch (insulin degludec)  Medications Discontinued at Hospital Discharge: -Stopped olmesartan 5 MG tablet (BENICAR) predniSONE 50 MG tablet (DELTASONE)  Medications that remain the same after Hospital Discharge:??  -All other medications will remain the same.    Patient presented to [facility] ED at [facility] on 04/23/22 due to Shortness of breath and other concern. Patient was present for 5 hours  New?Medications Started at [facility] Discharge:?? -started  predniSONE 50 MG tablet  Medication Changes at Hospital Discharge: -Changed  none  Medications Discontinued  at Hospital Discharge: -Stopped none  Medications that remain the same after Hospital Discharge:??  -All other medications will remain the same.     Medications: Outpatient Encounter Medications as of 05/15/2022  Medication Sig   ACCU-CHEK AVIVA PLUS test strip CHECK BLOOD SUGAR ONCE DAILY. DX E11.51   apixaban (ELIQUIS) 2.5 MG TABS tablet TAKE ONE TABLET BY MOUTH AT BREAKFAST AND AT BEDTIME   atorvastatin (LIPITOR) 40 MG tablet TAKE ONE TABLET BY MOUTH ONCE DAILY   Continuous Blood Gluc Receiver (DEXCOM G6 RECEIVER) DEVI CHECK VLOOD SUGAR AS DIRECTED AS DIRECTED   Continuous Blood Gluc Sensor (DEXCOM G6 SENSOR) MISC APPLY 1 SENSOR EVERY 10 DAYS   Continuous Blood Gluc Transmit (DEXCOM G6 TRANSMITTER) MISC APPLY TO SENSOR EVERY 90 DAYS   cyanocobalamin (VITAMIN B12) 1000 MCG tablet Take 1 tablet (1,000 mcg total) by mouth daily.   dextromethorphan (DELSYM) 30 MG/5ML liquid Take 5 mLs (30 mg total) by mouth 2 (two) times daily as needed for cough.   diltiazem (CARDIZEM CD) 360 MG 24 hr capsule Take 1 capsule (360 mg total) by mouth daily.   folic acid (FOLVITE) 1 MG tablet Take 1 tablet (1 mg total) by mouth daily.   furosemide (LASIX) 80 MG tablet TAKE ONE TABLET BY MOUTH ONCE DAILY   insulin degludec (TRESIBA FLEXTOUCH) 100 UNIT/ML FlexTouch Pen Inject 25 Units into the skin at bedtime.   ipratropium-albuterol (DUONEB) 0.5-2.5 (3) MG/3ML SOLN Take 3 mLs by nebulization every 4 (four) hours as needed (wheezing, shortness of breath, cough).   isosorbide mononitrate (IMDUR) 30 MG 24 hr tablet Take 1 tablet (30 mg total) by mouth daily.   levalbuterol (XOPENEX HFA) 45 MCG/ACT inhaler INHALE 1 TO 2 PUFFS BY MOUTH EVERY 6 HOURS AS NEEDED FOR WHEEZE   metFORMIN (GLUCOPHAGE) 1000 MG tablet TAKE ONE  TABLET BY MOUTH AT BREAKFAST AND AT BEDTIME   metoprolol tartrate (LOPRESSOR) 100 MG tablet TAKE ONE TABLET BY MOUTH AT BREAKFAST AND AT BEDTIME   nitroGLYCERIN (NITROSTAT) 0.4 MG SL tablet Place 1  tablet (0.4 mg total) under the tongue every 5 (five) minutes as needed for chest pain. X 3 doses   polyethylene glycol (MIRALAX / GLYCOLAX) packet Take 17 g by mouth daily as needed for mild constipation.   potassium chloride SA (KLOR-CON M) 20 MEQ tablet Take 1 tablet (20 mEq total) by mouth every other day.   QUEtiapine (SEROQUEL) 25 MG tablet Take 0.5 tablets (12.5 mg total) by mouth at bedtime.   Tiotropium Bromide-Olodaterol (STIOLTO RESPIMAT) 2.5-2.5 MCG/ACT AERS Inhale 2 puffs into the lungs daily.   No facility-administered encounter medications on file as of 05/15/2022.    Recent vitals BP Readings from Last 3 Encounters:  05/14/22 120/88  05/02/22 114/69  04/24/22 (!) 170/80   Pulse Readings from Last 3 Encounters:  05/14/22 76  05/02/22 66  04/24/22 89   Wt Readings from Last 3 Encounters:  05/14/22 169 lb 8 oz (76.9 kg)  04/30/22 169 lb 12.1 oz (77 kg)  04/23/22 163 lb (73.9 kg)   BMI Readings from Last 3 Encounters:  05/14/22 25.77 kg/m  04/30/22 25.81 kg/m  04/23/22 24.78 kg/m    Recent lab results    Component Value Date/Time   NA 146 (H) 05/01/2022 0608   NA 146 (H) 10/13/2021 1118   K 3.7 05/01/2022 0608   CL 107 05/01/2022 0608   CO2 29 05/01/2022 0608   GLUCOSE 138 (H) 05/01/2022 0608   BUN 48 (H) 05/01/2022 0608   BUN 22 10/13/2021 1118   CREATININE 1.87 (H) 05/01/2022 0608   CALCIUM 9.0 05/01/2022 0608    Lab Results  Component Value Date   CREATININE 1.87 (H) 05/01/2022   GFR 46.36 (L) 02/10/2021   EGFR 41 (L) 10/13/2021   GFRNONAA 36 (L) 05/01/2022   GFRAA 56 (L) 03/06/2019   Lab Results  Component Value Date/Time   HGBA1C 6.8 (H) 04/29/2022 04:34 PM   HGBA1C 6.3 (A) 11/20/2021 11:11 AM   HGBA1C 5.9 (A) 08/20/2021 01:19 PM   HGBA1C 6.7 (H) 02/10/2021 02:04 PM   MICROALBUR 15.9 (H) 05/19/2021 02:02 PM   MICROALBUR 3.7 (H) 08/30/2015 08:21 AM    Lab Results  Component Value Date   CHOL 112 10/13/2021   HDL 39 (L) 10/13/2021    LDLCALC 58 10/13/2021   TRIG 70 10/13/2021   CHOLHDL 2.9 10/13/2021    Care Gaps: Zoster Vaccine - Overdue TDAP - Overdue Eye Exam - Overdue Diabetic Urine - Overdue AWV - 01/2022  Star Rating Drugs:  Metformin (Glucophage) 1000 mg - Last filled 05/06/21 30 DS at [facility] Atorvastatin (Lipitor) 40 mg Last filled 05/06/21 30 DS at [facility]  Pamala Duffel CMA Clinical Pharmacist Assistant [phone]

## 2022-05-15 NOTE — Telephone Encounter (Signed)
Spoke with Jason Fisher and informed her of the orders as below per [facility].

## 2022-05-15 NOTE — Telephone Encounter (Signed)
Reporting patient has elevated BP 145/82 oxygen sat 95% room air, pulse 67, after movement, blood pressure was 192/120. 169/96 after sitting. Oxygen 98% pulse 92. Then 168/113. Wife says he had not taken his bp meds that day.  bad headache 4/10. Constipated and straining. Put on oxygen, easily fatigued. 165/92, 95% room oxygen pulse 74. Then 160/98. Wants to know if patient needs to go to ED or just continue monitoring.

## 2022-05-18 NOTE — Telephone Encounter (Signed)
Left detailed message on machine with verbal orders 

## 2022-05-20 DIAGNOSIS — Z87891 Personal history of nicotine dependence: Secondary | ICD-10-CM | POA: Diagnosis not present

## 2022-05-20 DIAGNOSIS — I2510 Atherosclerotic heart disease of native coronary artery without angina pectoris: Secondary | ICD-10-CM | POA: Diagnosis not present

## 2022-05-20 DIAGNOSIS — J9621 Acute and chronic respiratory failure with hypoxia: Secondary | ICD-10-CM | POA: Diagnosis not present

## 2022-05-20 DIAGNOSIS — Z86010 Personal history of colonic polyps: Secondary | ICD-10-CM | POA: Diagnosis not present

## 2022-05-20 DIAGNOSIS — E0965 Drug or chemical induced diabetes mellitus with hyperglycemia: Secondary | ICD-10-CM | POA: Diagnosis not present

## 2022-05-20 DIAGNOSIS — Z7901 Long term (current) use of anticoagulants: Secondary | ICD-10-CM | POA: Diagnosis not present

## 2022-05-20 DIAGNOSIS — I5043 Acute on chronic combined systolic (congestive) and diastolic (congestive) heart failure: Secondary | ICD-10-CM | POA: Diagnosis not present

## 2022-05-20 DIAGNOSIS — E538 Deficiency of other specified B group vitamins: Secondary | ICD-10-CM | POA: Diagnosis not present

## 2022-05-20 DIAGNOSIS — I255 Ischemic cardiomyopathy: Secondary | ICD-10-CM | POA: Diagnosis not present

## 2022-05-20 DIAGNOSIS — Z955 Presence of coronary angioplasty implant and graft: Secondary | ICD-10-CM | POA: Diagnosis not present

## 2022-05-20 DIAGNOSIS — I6529 Occlusion and stenosis of unspecified carotid artery: Secondary | ICD-10-CM | POA: Diagnosis not present

## 2022-05-20 DIAGNOSIS — Z7951 Long term (current) use of inhaled steroids: Secondary | ICD-10-CM | POA: Diagnosis not present

## 2022-05-20 DIAGNOSIS — E785 Hyperlipidemia, unspecified: Secondary | ICD-10-CM | POA: Diagnosis not present

## 2022-05-20 DIAGNOSIS — I130 Hypertensive heart and chronic kidney disease with heart failure and stage 1 through stage 4 chronic kidney disease, or unspecified chronic kidney disease: Secondary | ICD-10-CM | POA: Diagnosis not present

## 2022-05-20 DIAGNOSIS — J9612 Chronic respiratory failure with hypercapnia: Secondary | ICD-10-CM | POA: Diagnosis not present

## 2022-05-20 DIAGNOSIS — I4821 Permanent atrial fibrillation: Secondary | ICD-10-CM | POA: Diagnosis not present

## 2022-05-20 DIAGNOSIS — J441 Chronic obstructive pulmonary disease with (acute) exacerbation: Secondary | ICD-10-CM | POA: Diagnosis not present

## 2022-05-20 DIAGNOSIS — E0922 Drug or chemical induced diabetes mellitus with diabetic chronic kidney disease: Secondary | ICD-10-CM | POA: Diagnosis not present

## 2022-05-20 DIAGNOSIS — Z7984 Long term (current) use of oral hypoglycemic drugs: Secondary | ICD-10-CM | POA: Diagnosis not present

## 2022-05-20 DIAGNOSIS — Z794 Long term (current) use of insulin: Secondary | ICD-10-CM | POA: Diagnosis not present

## 2022-05-20 DIAGNOSIS — N1832 Chronic kidney disease, stage 3b: Secondary | ICD-10-CM | POA: Diagnosis not present

## 2022-05-20 DIAGNOSIS — Z9981 Dependence on supplemental oxygen: Secondary | ICD-10-CM | POA: Diagnosis not present

## 2022-05-20 DIAGNOSIS — E0951 Drug or chemical induced diabetes mellitus with diabetic peripheral angiopathy without gangrene: Secondary | ICD-10-CM | POA: Diagnosis not present

## 2022-05-22 ENCOUNTER — Telehealth: Payer: Self-pay

## 2022-05-22 ENCOUNTER — Telehealth: Payer: Self-pay | Admitting: Internal Medicine

## 2022-05-22 ENCOUNTER — Ambulatory Visit: Payer: Medicare Other | Admitting: Cardiology

## 2022-05-22 NOTE — Telephone Encounter (Signed)
Hosie Poisson PTA with [facility] is calling to report pt had missed visit today due to he is travelling

## 2022-05-22 NOTE — Progress Notes (Signed)
Care Management & Coordination Services Pharmacy Team  Reason for Encounter: Medication coordination and delivery  Contacted patient to discuss medications and coordinate delivery from [facility] pharmacy. Unsuccessful outreach. Left voicemail for patient to return call. Cycle dispensing form sent to [facility] for review.   Last adherence delivery date:04/08/22      Patient is due for next adherence delivery on: 06/04/22   This delivery to include: Adherence Packaging  30 Days  Metformin (Glucophage)1000 mg : Take one tablet at Breakfast and at bedtime Eliquis 2.5 mg : Take one tablet at Breakfast and at Bedtime Furosemide  (Lasix) 80 mg : Take one tablet at Breakfast Metoprolol (Lopressor) 100 mg : Take one tablet at Breakfast and at Dinner Isosorbide Mono 30 mg: Take one at breakfast  Atorvastatin (Lipitor) 40 mg : Take one tablet at Breakfast Diltiazem (Cardizem) 120 mg : Take one tablet at Breakfast Olmesartan 5 mg: Take 2 tabs(10 mg) daily at breakfast Stiolto  Evaristo Bury due to be sent out on 06/16/22 with [facility]    Chart review: Recent office visits:  05/25/22 [name], CMA - Patent presented for B-12 injection.  Recent consult visits:  None  Hospital visits:  Medication Reconciliation was completed by comparing discharge summary, patient's EMR and Pharmacy list, and upon discussion with patient.   Patient presented to [facility] on 04/27/22 due to Acute respiratory failure with hypoxia and hypercarbia. Patient was present for 5 days.   New?Medications Started at [facility] Discharge:?? -started  cyanocobalamin (VITAMIN B12) dextromethorphan (Delsym) folic acid (FOLVITE) guaiFENesin (MUCINEX) ipratropium-albuterol (DUONEB) potassium chloride SA (KLOR-CON M)   Medication Changes at Hospital Discharge: -Changed  diltiazem (CARDIZEM CD) Eliquis (apixaban) Evaristo Bury FlexTouch (insulin degludec)   Medications Discontinued at Hospital  Discharge: -Stopped olmesartan 5 MG tablet (BENICAR) predniSONE 50 MG tablet (DELTASONE)   Medications that remain the same after Hospital Discharge:??  -All other medications will remain the same.     Patient presented to [facility] ED at [facility] on 04/23/22 due to Shortness of breath and other concern. Patient was present for 5 hours   New?Medications Started at [facility] Discharge:?? -started  predniSONE 50 MG tablet   Medication Changes at Hospital Discharge: -Changed  none   Medications Discontinued at Hospital Discharge: -Stopped none   Medications that remain the same after Hospital Discharge:??  -All other medications will remain the same.    Medications: Outpatient Encounter Medications as of 05/22/2022  Medication Sig   ACCU-CHEK AVIVA PLUS test strip CHECK BLOOD SUGAR ONCE DAILY. DX E11.51   apixaban (ELIQUIS) 2.5 MG TABS tablet TAKE ONE TABLET BY MOUTH AT BREAKFAST AND AT BEDTIME   atorvastatin (LIPITOR) 40 MG tablet TAKE ONE TABLET BY MOUTH ONCE DAILY   Continuous Blood Gluc Receiver (DEXCOM G6 RECEIVER) DEVI CHECK VLOOD SUGAR AS DIRECTED AS DIRECTED   Continuous Blood Gluc Sensor (DEXCOM G6 SENSOR) MISC APPLY 1 SENSOR EVERY 10 DAYS   Continuous Blood Gluc Transmit (DEXCOM G6 TRANSMITTER) MISC APPLY TO SENSOR EVERY 90 DAYS   cyanocobalamin (VITAMIN B12) 1000 MCG tablet Take 1 tablet (1,000 mcg total) by mouth daily.   dextromethorphan (DELSYM) 30 MG/5ML liquid Take 5 mLs (30 mg total) by mouth 2 (two) times daily as needed for cough.   diltiazem (CARDIZEM CD) 360 MG 24 hr capsule Take 1 capsule (360 mg total) by mouth daily.   folic acid (FOLVITE) 1 MG tablet Take 1 tablet (1 mg total) by mouth daily.   furosemide (LASIX) 80 MG  tablet TAKE ONE TABLET BY MOUTH ONCE DAILY   insulin degludec (TRESIBA FLEXTOUCH) 100 UNIT/ML FlexTouch Pen Inject 25 Units into the skin at bedtime.   ipratropium-albuterol (DUONEB) 0.5-2.5 (3) MG/3ML SOLN Take 3 mLs by nebulization every  4 (four) hours as needed (wheezing, shortness of breath, cough).   isosorbide mononitrate (IMDUR) 30 MG 24 hr tablet Take 1 tablet (30 mg total) by mouth daily.   levalbuterol (XOPENEX HFA) 45 MCG/ACT inhaler INHALE 1 TO 2 PUFFS BY MOUTH EVERY 6 HOURS AS NEEDED FOR WHEEZE   metFORMIN (GLUCOPHAGE) 1000 MG tablet TAKE ONE TABLET BY MOUTH AT BREAKFAST AND AT BEDTIME   metoprolol tartrate (LOPRESSOR) 100 MG tablet TAKE ONE TABLET BY MOUTH AT BREAKFAST AND AT BEDTIME   nitroGLYCERIN (NITROSTAT) 0.4 MG SL tablet Place 1 tablet (0.4 mg total) under the tongue every 5 (five) minutes as needed for chest pain. X 3 doses   polyethylene glycol (MIRALAX / GLYCOLAX) packet Take 17 g by mouth daily as needed for mild constipation.   potassium chloride SA (KLOR-CON M) 20 MEQ tablet Take 1 tablet (20 mEq total) by mouth every other day.   QUEtiapine (SEROQUEL) 25 MG tablet Take 0.5 tablets (12.5 mg total) by mouth at bedtime.   Tiotropium Bromide-Olodaterol (STIOLTO RESPIMAT) 2.5-2.5 MCG/ACT AERS Inhale 2 puffs into the lungs daily.   No facility-administered encounter medications on file as of 05/22/2022.   BP Readings from Last 3 Encounters:  05/14/22 120/88  05/02/22 114/69  04/24/22 (!) 170/80    Pulse Readings from Last 3 Encounters:  05/14/22 76  05/02/22 66  04/24/22 89    Lab Results  Component Value Date/Time   HGBA1C 6.8 (H) 04/29/2022 04:34 PM   HGBA1C 6.3 (A) 11/20/2021 11:11 AM   HGBA1C 5.9 (A) 08/20/2021 01:19 PM   HGBA1C 6.7 (H) 02/10/2021 02:04 PM   Lab Results  Component Value Date   CREATININE 1.87 (H) 05/01/2022   BUN 48 (H) 05/01/2022   GFR 46.36 (L) 02/10/2021   GFRNONAA 36 (L) 05/01/2022   GFRAA 56 (L) 03/06/2019   NA 146 (H) 05/01/2022   K 3.7 05/01/2022   CALCIUM 9.0 05/01/2022   CO2 29 05/01/2022        Pamala Duffel CMA Clinical Pharmacist Assistant [phone]

## 2022-05-25 ENCOUNTER — Ambulatory Visit (INDEPENDENT_AMBULATORY_CARE_PROVIDER_SITE_OTHER): Payer: Medicare Other | Admitting: *Deleted

## 2022-05-25 ENCOUNTER — Ambulatory Visit: Payer: Medicare Other | Admitting: Internal Medicine

## 2022-05-25 DIAGNOSIS — E538 Deficiency of other specified B group vitamins: Secondary | ICD-10-CM

## 2022-05-25 MED ORDER — CYANOCOBALAMIN 1000 MCG/ML IJ SOLN
1000.0000 ug | Freq: Once | INTRAMUSCULAR | Status: AC
  Administered 2022-05-25: 1000 ug via INTRAMUSCULAR

## 2022-05-25 NOTE — Progress Notes (Signed)
Per orders of Dr. Hernandez, injection of B12 given by Kimmie Berggren. Patient tolerated injection well.  

## 2022-05-26 DIAGNOSIS — I5043 Acute on chronic combined systolic (congestive) and diastolic (congestive) heart failure: Secondary | ICD-10-CM | POA: Diagnosis not present

## 2022-05-26 DIAGNOSIS — I6529 Occlusion and stenosis of unspecified carotid artery: Secondary | ICD-10-CM | POA: Diagnosis not present

## 2022-05-26 DIAGNOSIS — E0951 Drug or chemical induced diabetes mellitus with diabetic peripheral angiopathy without gangrene: Secondary | ICD-10-CM | POA: Diagnosis not present

## 2022-05-26 DIAGNOSIS — E0965 Drug or chemical induced diabetes mellitus with hyperglycemia: Secondary | ICD-10-CM | POA: Diagnosis not present

## 2022-05-26 DIAGNOSIS — E538 Deficiency of other specified B group vitamins: Secondary | ICD-10-CM | POA: Diagnosis not present

## 2022-05-26 DIAGNOSIS — N1832 Chronic kidney disease, stage 3b: Secondary | ICD-10-CM | POA: Diagnosis not present

## 2022-05-26 DIAGNOSIS — I4821 Permanent atrial fibrillation: Secondary | ICD-10-CM | POA: Diagnosis not present

## 2022-05-26 DIAGNOSIS — Z794 Long term (current) use of insulin: Secondary | ICD-10-CM | POA: Diagnosis not present

## 2022-05-26 DIAGNOSIS — I130 Hypertensive heart and chronic kidney disease with heart failure and stage 1 through stage 4 chronic kidney disease, or unspecified chronic kidney disease: Secondary | ICD-10-CM | POA: Diagnosis not present

## 2022-05-26 DIAGNOSIS — J441 Chronic obstructive pulmonary disease with (acute) exacerbation: Secondary | ICD-10-CM | POA: Diagnosis not present

## 2022-05-26 DIAGNOSIS — Z7951 Long term (current) use of inhaled steroids: Secondary | ICD-10-CM | POA: Diagnosis not present

## 2022-05-26 DIAGNOSIS — E0922 Drug or chemical induced diabetes mellitus with diabetic chronic kidney disease: Secondary | ICD-10-CM | POA: Diagnosis not present

## 2022-05-26 DIAGNOSIS — Z86010 Personal history of colonic polyps: Secondary | ICD-10-CM | POA: Diagnosis not present

## 2022-05-26 DIAGNOSIS — I2510 Atherosclerotic heart disease of native coronary artery without angina pectoris: Secondary | ICD-10-CM | POA: Diagnosis not present

## 2022-05-26 DIAGNOSIS — J9621 Acute and chronic respiratory failure with hypoxia: Secondary | ICD-10-CM | POA: Diagnosis not present

## 2022-05-26 DIAGNOSIS — J9612 Chronic respiratory failure with hypercapnia: Secondary | ICD-10-CM | POA: Diagnosis not present

## 2022-05-26 DIAGNOSIS — Z87891 Personal history of nicotine dependence: Secondary | ICD-10-CM | POA: Diagnosis not present

## 2022-05-26 DIAGNOSIS — Z7901 Long term (current) use of anticoagulants: Secondary | ICD-10-CM | POA: Diagnosis not present

## 2022-05-26 DIAGNOSIS — I255 Ischemic cardiomyopathy: Secondary | ICD-10-CM | POA: Diagnosis not present

## 2022-05-26 DIAGNOSIS — E785 Hyperlipidemia, unspecified: Secondary | ICD-10-CM | POA: Diagnosis not present

## 2022-05-26 DIAGNOSIS — Z9981 Dependence on supplemental oxygen: Secondary | ICD-10-CM | POA: Diagnosis not present

## 2022-05-26 DIAGNOSIS — Z955 Presence of coronary angioplasty implant and graft: Secondary | ICD-10-CM | POA: Diagnosis not present

## 2022-05-26 DIAGNOSIS — Z7984 Long term (current) use of oral hypoglycemic drugs: Secondary | ICD-10-CM | POA: Diagnosis not present

## 2022-05-27 ENCOUNTER — Encounter: Payer: Self-pay | Admitting: Primary Care

## 2022-05-27 ENCOUNTER — Ambulatory Visit: Payer: Medicare Other | Admitting: Primary Care

## 2022-05-27 VITALS — BP 112/62 | HR 62 | Temp 97.6°F | Ht 68.0 in | Wt 171.2 lb

## 2022-05-27 DIAGNOSIS — R918 Other nonspecific abnormal finding of lung field: Secondary | ICD-10-CM | POA: Diagnosis not present

## 2022-05-27 DIAGNOSIS — J42 Unspecified chronic bronchitis: Secondary | ICD-10-CM

## 2022-05-27 DIAGNOSIS — F17200 Nicotine dependence, unspecified, uncomplicated: Secondary | ICD-10-CM | POA: Diagnosis not present

## 2022-05-27 DIAGNOSIS — R911 Solitary pulmonary nodule: Secondary | ICD-10-CM | POA: Diagnosis not present

## 2022-05-27 DIAGNOSIS — Z9981 Dependence on supplemental oxygen: Secondary | ICD-10-CM

## 2022-05-27 DIAGNOSIS — J9611 Chronic respiratory failure with hypoxia: Secondary | ICD-10-CM

## 2022-05-27 NOTE — Assessment & Plan Note (Signed)
-   Needs repeat CT chest in 4 weeks to follow-up on presumed pneumonia right lung

## 2022-05-27 NOTE — Patient Instructions (Addendum)
CT chest while in hospital showed RIGHT lung nodule, likely infectious but we need to follow-up on this due to smoking history with repeat CT scan of your chest in 4 weeks   Recommendations:  - Continue Stiolto 2 puffs once daily every morning - Use Levalbuterol inhaler or ipratropium-albuterol nebulizer every 4-6 hours as needed for breakthrough shortness of breath or wheezing - Wear oxygen as needed with exertion to maintain O2 >88-90%   Orders; CT chest wo contrast in 4 weeks re: pulmonary nodule   Follow-up: - 3-4 months with Dr. Delton Coombes or sooner if needed

## 2022-05-27 NOTE — Assessment & Plan Note (Signed)
-   Patient quit smoking in February 2024, congratulated on his hard work. Encourage he maintain his abstinence from cigarettes smoke

## 2022-05-27 NOTE — Progress Notes (Signed)
@Patient  ID: Jason Fisher, male    DOB: [birth year], 82 y.o.   MRN: [id number]  Chief Complaint  Patient presents with   Follow-up    6 month fu    Referring provider: Philip Aspen, [place]*  HPI: 82 year old male, former smoker. Current everyday smoker. PMH significant for COPD, chronic respiratory failure, pulmonary nodules, coronary artery disease, A-fib, congestive heart failure, kidney disease.  Patient Dr. Delton Coombes last seen on 11/25/2021.  Previous [facility] pulmonary encounter: ROV 11/25/21 --82 year old gentleman with a history of very severe COPD, CAD, chronic atrial fibrillation, hypertension with diastolic CHF, diabetes.  He also had remote pulmonary nodular disease that we had followed by CT chest and established stability.  We have been managing him on Bevespi.  He reports that his exertional tolerance has been lower - gets very fatigued when walking an extended distance, sometimes associated with hypotension. He has been on Imdur for a couple months and has had an improvement in NTG use, as well as CP in general. He has some wheeze at night when he lays down, has cough at night when he lays down - clears mucous some nights. He does not believe that he can feel the bevespi getting into his chest  05/27/2022- interim hx  Patient presents today for 6 month follow-up. During last visit he was changed from [place] and [employer]. He was admitted in March for COPD exacerbation/pneumonia. He had a cough at that time. CT chest showed ill defined patchy nodular opacities right lung, largest measuring 12x65mm, may represent multifocal pneumonia/inflammation metastatic disease or neoplasm is not excluded. Completed ceftrixone and doxycycline. CXR on 05/01/22 did not show pneumonia. He was discharged with oxygen but has not needed to use. He is doing some better, still has some residual fatigue. Sob has improved. No longer has cough. His wife has noticed improvement in his breathing on Stiolto Respimat.  He very seldomly needed to use Albuterol. He quit smoking 2 months ago.   Allergies  Allergen Reactions   Contrast Media [Iodinated Contrast Media] Anaphylaxis    Immunization History  Administered Date(s) Administered   COVID-19, mRNA, vaccine(Comirnaty)12 years and older 12/15/2021   Fluad Quad(high Dose 65+) 02/07/2020, 12/15/2021   Influenza, High Dose Seasonal PF 01/21/2015, 02/05/2016, 11/23/2016, 10/28/2017, 01/17/2021   Influenza,inj,Quad PF,6+ Mos 10/31/2013   Influenza-Unspecified 11/23/2016, 12/25/2018   PFIZER(Purple Top)SARS-COV-2 Vaccination 06/01/2019, 07/02/2019   PNEUMOCOCCAL CONJUGATE-20 01/21/2022   Pneumococcal Conjugate-13 01/09/2013   Pneumococcal Polysaccharide-23 03/07/2014   Respiratory Syncytial Virus Vaccine,Recomb Aduvanted(Arexvy) 03/17/2022   Tetanus 07/31/2013    Past Medical History:  Diagnosis Date   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 2007 Inf STEMI s/p Vision BMS  2 to RCA;  b. 2009 ISR Prox RCA Stent-->with DES;  c. 09/2015 PCI: LM nl, LAD 30p/m, D1 90, LCX nl, OM1 small, 80, OM2 90 (2.5x14 Biofreedom stent), OM3 small, RCA 30p ISR, 20m ISR, 61m, 85d, EF 50-55%.   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 On Eliquis (CHA2DS2VASc = 5).  Rate controlled w/ BB and CCB.   Chronic diastolic CHF (congestive heart failure)    a. 09/2015 Echo: EF 55-60%, mildly dil LA.   CKD (chronic kidney disease), stage III    they mentioned this to the patient but they would work on it later after the aneurysm   COLONIC POLYPS, HX OF 11/02/2006   COPD (chronic  obstructive pulmonary disease)    DIABETES MELLITUS, TYPE II 10/29/2006   Diverticulosis    History of tobacco abuse    a. Quit 09/2015.   HYPERLIPIDEMIA 10/29/2006   Hypertension    Hypertensive heart disease with heart failure    Pneumonia 09/2015   [name] 07/31/2016   STEMI (ST elevation  myocardial infarction) 2007   /notes 07/31/2016    Tobacco History: Social History   Tobacco Use  Smoking Status Former   Packs/day: 0.25   Years: 49.00   Additional pack years: 0.00   Total pack years: 12.25   Types: Cigarettes   Quit date: 09/08/2015   Years since quitting: 6.7   Passive exposure: Current  Smokeless Tobacco Never  Tobacco Comments   passive smoker as well   11/20/2021 patient states he smokes a couple cigarettes a day   Counseling given: Not Answered Tobacco comments: passive smoker as well 11/20/2021 patient states he smokes a couple cigarettes a day   Outpatient Medications Prior to Visit  Medication Sig Dispense Refill   ACCU-CHEK AVIVA PLUS test strip CHECK BLOOD SUGAR ONCE DAILY. DX E11.51 100 strip 2   apixaban (ELIQUIS) 2.5 MG TABS tablet TAKE ONE TABLET BY MOUTH AT BREAKFAST AND AT BEDTIME 180 tablet 1   atorvastatin (LIPITOR) 40 MG tablet TAKE ONE TABLET BY MOUTH ONCE DAILY 90 tablet 1   Continuous Blood Gluc Receiver (DEXCOM G6 RECEIVER) DEVI CHECK VLOOD SUGAR AS DIRECTED AS DIRECTED 1 each 0   Continuous Blood Gluc Sensor (DEXCOM G6 SENSOR) MISC APPLY 1 SENSOR EVERY 10 DAYS 3 each 2   Continuous Blood Gluc Transmit (DEXCOM G6 TRANSMITTER) MISC APPLY TO SENSOR EVERY 90 DAYS 1 each 0   Cyanocobalamin (B-12 COMPLIANCE INJECTION IJ) Inject as directed every 30 (thirty) days.     dextromethorphan (DELSYM) 30 MG/5ML liquid Take 5 mLs (30 mg total) by mouth 2 (two) times daily as needed for cough. 89 mL 1   diltiazem (CARDIZEM CD) 360 MG 24 hr capsule Take 1 capsule (360 mg total) by mouth daily. 30 capsule 1   folic acid (FOLVITE) 1 MG tablet Take 1 tablet (1 mg total) by mouth daily. 30 tablet 1   furosemide (LASIX) 80 MG tablet TAKE ONE TABLET BY MOUTH ONCE DAILY 90 tablet 1   insulin degludec (TRESIBA FLEXTOUCH) 100 UNIT/ML FlexTouch Pen Inject 25 Units into the skin at bedtime. 15 mL 2   ipratropium-albuterol (DUONEB) 0.5-2.5 (3) MG/3ML SOLN Take 3  mLs by nebulization every 4 (four) hours as needed (wheezing, shortness of breath, cough). 360 mL 1   isosorbide mononitrate (IMDUR) 30 MG 24 hr tablet Take 1 tablet (30 mg total) by mouth daily. 90 tablet 3   levalbuterol (XOPENEX HFA) 45 MCG/ACT inhaler INHALE 1 TO 2 PUFFS BY MOUTH EVERY 6 HOURS AS NEEDED FOR WHEEZE 15 each 3   metFORMIN (GLUCOPHAGE) 1000 MG tablet TAKE ONE TABLET BY MOUTH AT BREAKFAST AND AT BEDTIME 180 tablet 1   metoprolol tartrate (LOPRESSOR) 100 MG tablet TAKE ONE TABLET BY MOUTH AT BREAKFAST AND AT BEDTIME 180 tablet 3   nitroGLYCERIN (NITROSTAT) 0.4 MG SL tablet Place 1 tablet (0.4 mg total) under the tongue every 5 (five) minutes as needed for chest pain. X 3 doses 25 tablet 4   polyethylene glycol (MIRALAX / GLYCOLAX) packet Take 17 g by mouth daily as needed for mild constipation.     potassium chloride SA (KLOR-CON M) 20 MEQ tablet Take 1 tablet (20 mEq total) by  mouth every other day. 15 tablet 0   QUEtiapine (SEROQUEL) 25 MG tablet Take 0.5 tablets (12.5 mg total) by mouth at bedtime. 30 tablet 1   Tiotropium Bromide-Olodaterol (STIOLTO RESPIMAT) 2.5-2.5 MCG/ACT AERS Inhale 2 puffs into the lungs daily. 4 g 5   cyanocobalamin (VITAMIN B12) 1000 MCG tablet Take 1 tablet (1,000 mcg total) by mouth daily. (Patient not taking: Reported on 05/27/2022) 30 tablet 2   No facility-administered medications prior to visit.   Review of Systems  Review of Systems  Constitutional:  Positive for fatigue.  Respiratory: Negative.  Negative for cough, chest tightness and wheezing.   Cardiovascular: Negative.    Physical Exam  BP 112/62 (BP Location: Left Arm, Patient Position: Sitting, Cuff Size: Normal)   Pulse 62   Temp 97.6 F (36.4 C) (Oral)   Ht 5\' 8"  (1.727 m)   Wt 171 lb 3.2 oz (77.7 kg)   SpO2 94%   BMI 26.03 kg/m  Physical Exam Constitutional:      General: He is not in acute distress.    Appearance: Normal appearance. He is not ill-appearing.  HENT:      Head: Normocephalic and atraumatic.     Mouth/Throat:     Mouth: Mucous membranes are moist.     Pharynx: Oropharynx is clear.  Cardiovascular:     Rate and Rhythm: Normal rate and regular rhythm.  Pulmonary:     Effort: Pulmonary effort is normal.     Breath sounds: Normal breath sounds.  Musculoskeletal:        General: Normal range of motion.  Skin:    General: Skin is warm and dry.  Neurological:     General: No focal deficit present.     Mental Status: He is alert and oriented to person, place, and time. Mental status is at baseline.  Psychiatric:        Mood and Affect: Mood normal.        Behavior: Behavior normal.        Thought Content: Thought content normal.        Judgment: Judgment normal.      Lab Results:  CBC    Component Value Date/Time   WBC 11.7 (H) 04/28/2022 0432   RBC 3.63 (L) 04/28/2022 0432   HGB 10.6 (L) 04/28/2022 0432   HGB 13.1 10/13/2021 1118   HCT 32.6 (L) 04/28/2022 0432   HCT 41.3 10/13/2021 1118   PLT 218 04/28/2022 0432   PLT 209 10/13/2021 1118   MCV 89.8 04/28/2022 0432   MCV 90 10/13/2021 1118   MCH 29.2 04/28/2022 0432   MCHC 32.5 04/28/2022 0432   RDW 14.9 04/28/2022 0432   RDW 14.3 10/13/2021 1118   LYMPHSABS 2.3 04/27/2022 0738   LYMPHSABS 2.6 10/13/2021 1118   MONOABS 2.0 (H) 04/27/2022 0738   EOSABS 0.0 04/27/2022 0738   EOSABS 0.2 10/13/2021 1118   BASOSABS 0.0 04/27/2022 0738   BASOSABS 0.1 10/13/2021 1118    BMET    Component Value Date/Time   NA 146 (H) 05/01/2022 0608   NA 146 (H) 10/13/2021 1118   K 3.7 05/01/2022 0608   CL 107 05/01/2022 0608   CO2 29 05/01/2022 0608   GLUCOSE 138 (H) 05/01/2022 0608   BUN 48 (H) 05/01/2022 0608   BUN 22 10/13/2021 1118   CREATININE 1.87 (H) 05/01/2022 0608   CALCIUM 9.0 05/01/2022 0608   GFRNONAA 36 (L) 05/01/2022 0608   GFRAA 56 (L) 03/06/2019 1404    BNP  Component Value Date/Time   BNP 747.0 (H) 04/27/2022 0738    ProBNP    Component Value Date/Time    PROBNP 289.0 (H) 02/05/2016 1206    Imaging: DG Chest Port 1 View  Result Date: 05/01/2022 CLINICAL DATA:  Hypoxia EXAM: PORTABLE CHEST 1 VIEW COMPARISON:  04/29/2022 FINDINGS: The cardio pericardial silhouette is enlarged. Interstitial markings are diffusely coarsened with chronic features. Tiny nodular densities again noted right lung. Interval improvement in basilar aeration with decrease of the tiny pleural effusions seen previously. Prominent skin fold overlies the lateral left lung. Bones are diffusely demineralized. IMPRESSION: 1. Chronic interstitial changes with improved aeration at the bases and decrease in tiny effusions. 2. Tiny nodular densities in the right lung. Electronically Signed   By: Kennith Center M.D.   On: 05/01/2022 08:35   DG Chest 1 View  Result Date: 04/29/2022 CLINICAL DATA:  Shortness of breath. EXAM: CHEST  1 VIEW COMPARISON:  04/27/2022 FINDINGS: Cardiopericardial silhouette is at upper limits of normal for size. Interstitial markings are diffusely coarsened with chronic features. Subtle nodular densities in the right lung were better evaluated by CT chest yesterday. Minimal atelectasis noted in the lung bases with tiny bilateral pleural effusions. Telemetry leads overlie the chest. IMPRESSION: 1. Tiny bilateral pleural effusions with bibasilar atelectasis. 2. Subtle nodular densities in the right lung were better evaluated by CT chest yesterday. Electronically Signed   By: Kennith Center M.D.   On: 04/29/2022 06:30     Assessment & Plan:   COPD (chronic obstructive pulmonary disease) - Admitted in March for COPD exacerbation/pneumonia. He had an acute cough at that time. CT chest showed ill defined nodular opacities to right lung, largest measuring 12 x 9mm to RUL. Felt to possibly reflect multifocal pneumonia, however, neoplasm is not excluded. Completed abx course; steroids caused delirum and were discontinued. Discharged on oxygen. He is doing better. Continue  Stiolto Respimat 2 puffs daily; use Levalbuterol inhaler or ipratropium-albuterol nebulizer every 4-6 hours as needed for breakthrough shortness of breath or wheezing. FU in 3-4 months with Dr. Delton Coombes.   Pulmonary nodules - Needs repeat CT chest in 4 weeks to follow-up on presumed pneumonia right lung   Chronic hypoxic respiratory failure, on home oxygen therapy [facility]) - Discharged with oxygen after hospital stay in March. O2 94% RA today. Continue 2L supplemental oxygen with exertion to maintain oxygen level >88-90%   TOBACCO USER - Patient quit smoking in February 2024, congratulated on his hard work. Encourage he maintain his abstinence from cigarettes smoke    [name], NP 05/27/2022

## 2022-05-27 NOTE — Assessment & Plan Note (Addendum)
-   Discharged with oxygen after hospital stay in March. O2 94% RA today. Continue 2L supplemental oxygen with exertion to maintain oxygen level >88-90%

## 2022-05-27 NOTE — Assessment & Plan Note (Addendum)
-   Admitted in March for COPD exacerbation/pneumonia. He had an acute cough at that time. CT chest showed ill defined nodular opacities to right lung, largest measuring 12 x 9mm to RUL. Felt to possibly reflect multifocal pneumonia, however, neoplasm is not excluded. Completed abx course; steroids caused delirum and were discontinued. Discharged on oxygen. He is doing better. Continue Stiolto Respimat 2 puffs daily; use Levalbuterol inhaler or ipratropium-albuterol nebulizer every 4-6 hours as needed for breakthrough shortness of breath or wheezing. FU in 3-4 months with Dr. Delton Coombes.

## 2022-05-28 DIAGNOSIS — I4821 Permanent atrial fibrillation: Secondary | ICD-10-CM | POA: Diagnosis not present

## 2022-05-28 DIAGNOSIS — E538 Deficiency of other specified B group vitamins: Secondary | ICD-10-CM | POA: Diagnosis not present

## 2022-05-28 DIAGNOSIS — J9612 Chronic respiratory failure with hypercapnia: Secondary | ICD-10-CM | POA: Diagnosis not present

## 2022-05-28 DIAGNOSIS — I255 Ischemic cardiomyopathy: Secondary | ICD-10-CM | POA: Diagnosis not present

## 2022-05-28 DIAGNOSIS — E0922 Drug or chemical induced diabetes mellitus with diabetic chronic kidney disease: Secondary | ICD-10-CM | POA: Diagnosis not present

## 2022-05-28 DIAGNOSIS — I6529 Occlusion and stenosis of unspecified carotid artery: Secondary | ICD-10-CM | POA: Diagnosis not present

## 2022-05-28 DIAGNOSIS — Z7984 Long term (current) use of oral hypoglycemic drugs: Secondary | ICD-10-CM | POA: Diagnosis not present

## 2022-05-28 DIAGNOSIS — Z87891 Personal history of nicotine dependence: Secondary | ICD-10-CM | POA: Diagnosis not present

## 2022-05-28 DIAGNOSIS — J9621 Acute and chronic respiratory failure with hypoxia: Secondary | ICD-10-CM | POA: Diagnosis not present

## 2022-05-28 DIAGNOSIS — E0951 Drug or chemical induced diabetes mellitus with diabetic peripheral angiopathy without gangrene: Secondary | ICD-10-CM | POA: Diagnosis not present

## 2022-05-28 DIAGNOSIS — I5043 Acute on chronic combined systolic (congestive) and diastolic (congestive) heart failure: Secondary | ICD-10-CM | POA: Diagnosis not present

## 2022-05-28 DIAGNOSIS — Z955 Presence of coronary angioplasty implant and graft: Secondary | ICD-10-CM | POA: Diagnosis not present

## 2022-05-28 DIAGNOSIS — N1832 Chronic kidney disease, stage 3b: Secondary | ICD-10-CM | POA: Diagnosis not present

## 2022-05-28 DIAGNOSIS — Z7951 Long term (current) use of inhaled steroids: Secondary | ICD-10-CM | POA: Diagnosis not present

## 2022-05-28 DIAGNOSIS — I130 Hypertensive heart and chronic kidney disease with heart failure and stage 1 through stage 4 chronic kidney disease, or unspecified chronic kidney disease: Secondary | ICD-10-CM | POA: Diagnosis not present

## 2022-05-28 DIAGNOSIS — I2510 Atherosclerotic heart disease of native coronary artery without angina pectoris: Secondary | ICD-10-CM | POA: Diagnosis not present

## 2022-05-28 DIAGNOSIS — Z9981 Dependence on supplemental oxygen: Secondary | ICD-10-CM | POA: Diagnosis not present

## 2022-05-28 DIAGNOSIS — E0965 Drug or chemical induced diabetes mellitus with hyperglycemia: Secondary | ICD-10-CM | POA: Diagnosis not present

## 2022-05-28 DIAGNOSIS — Z794 Long term (current) use of insulin: Secondary | ICD-10-CM | POA: Diagnosis not present

## 2022-05-28 DIAGNOSIS — Z86010 Personal history of colonic polyps: Secondary | ICD-10-CM | POA: Diagnosis not present

## 2022-05-28 DIAGNOSIS — J441 Chronic obstructive pulmonary disease with (acute) exacerbation: Secondary | ICD-10-CM | POA: Diagnosis not present

## 2022-05-28 DIAGNOSIS — E785 Hyperlipidemia, unspecified: Secondary | ICD-10-CM | POA: Diagnosis not present

## 2022-05-28 DIAGNOSIS — Z7901 Long term (current) use of anticoagulants: Secondary | ICD-10-CM | POA: Diagnosis not present

## 2022-06-02 DIAGNOSIS — Z7951 Long term (current) use of inhaled steroids: Secondary | ICD-10-CM | POA: Diagnosis not present

## 2022-06-02 DIAGNOSIS — Z87891 Personal history of nicotine dependence: Secondary | ICD-10-CM | POA: Diagnosis not present

## 2022-06-02 DIAGNOSIS — Z86010 Personal history of colonic polyps: Secondary | ICD-10-CM | POA: Diagnosis not present

## 2022-06-02 DIAGNOSIS — Z9981 Dependence on supplemental oxygen: Secondary | ICD-10-CM | POA: Diagnosis not present

## 2022-06-02 DIAGNOSIS — I4821 Permanent atrial fibrillation: Secondary | ICD-10-CM | POA: Diagnosis not present

## 2022-06-02 DIAGNOSIS — Z794 Long term (current) use of insulin: Secondary | ICD-10-CM | POA: Diagnosis not present

## 2022-06-02 DIAGNOSIS — J441 Chronic obstructive pulmonary disease with (acute) exacerbation: Secondary | ICD-10-CM | POA: Diagnosis not present

## 2022-06-02 DIAGNOSIS — E538 Deficiency of other specified B group vitamins: Secondary | ICD-10-CM | POA: Diagnosis not present

## 2022-06-02 DIAGNOSIS — I6529 Occlusion and stenosis of unspecified carotid artery: Secondary | ICD-10-CM | POA: Diagnosis not present

## 2022-06-02 DIAGNOSIS — E0965 Drug or chemical induced diabetes mellitus with hyperglycemia: Secondary | ICD-10-CM | POA: Diagnosis not present

## 2022-06-02 DIAGNOSIS — Z955 Presence of coronary angioplasty implant and graft: Secondary | ICD-10-CM | POA: Diagnosis not present

## 2022-06-02 DIAGNOSIS — I2510 Atherosclerotic heart disease of native coronary artery without angina pectoris: Secondary | ICD-10-CM | POA: Diagnosis not present

## 2022-06-02 DIAGNOSIS — E785 Hyperlipidemia, unspecified: Secondary | ICD-10-CM | POA: Diagnosis not present

## 2022-06-02 DIAGNOSIS — E0922 Drug or chemical induced diabetes mellitus with diabetic chronic kidney disease: Secondary | ICD-10-CM | POA: Diagnosis not present

## 2022-06-02 DIAGNOSIS — Z7984 Long term (current) use of oral hypoglycemic drugs: Secondary | ICD-10-CM | POA: Diagnosis not present

## 2022-06-02 DIAGNOSIS — J9612 Chronic respiratory failure with hypercapnia: Secondary | ICD-10-CM | POA: Diagnosis not present

## 2022-06-02 DIAGNOSIS — J449 Chronic obstructive pulmonary disease, unspecified: Secondary | ICD-10-CM | POA: Diagnosis not present

## 2022-06-02 DIAGNOSIS — J9621 Acute and chronic respiratory failure with hypoxia: Secondary | ICD-10-CM | POA: Diagnosis not present

## 2022-06-02 DIAGNOSIS — I255 Ischemic cardiomyopathy: Secondary | ICD-10-CM | POA: Diagnosis not present

## 2022-06-02 DIAGNOSIS — N1832 Chronic kidney disease, stage 3b: Secondary | ICD-10-CM | POA: Diagnosis not present

## 2022-06-02 DIAGNOSIS — Z7901 Long term (current) use of anticoagulants: Secondary | ICD-10-CM | POA: Diagnosis not present

## 2022-06-02 DIAGNOSIS — I5043 Acute on chronic combined systolic (congestive) and diastolic (congestive) heart failure: Secondary | ICD-10-CM | POA: Diagnosis not present

## 2022-06-02 DIAGNOSIS — E0951 Drug or chemical induced diabetes mellitus with diabetic peripheral angiopathy without gangrene: Secondary | ICD-10-CM | POA: Diagnosis not present

## 2022-06-02 DIAGNOSIS — I130 Hypertensive heart and chronic kidney disease with heart failure and stage 1 through stage 4 chronic kidney disease, or unspecified chronic kidney disease: Secondary | ICD-10-CM | POA: Diagnosis not present

## 2022-06-04 DIAGNOSIS — Z87891 Personal history of nicotine dependence: Secondary | ICD-10-CM | POA: Diagnosis not present

## 2022-06-04 DIAGNOSIS — J9612 Chronic respiratory failure with hypercapnia: Secondary | ICD-10-CM | POA: Diagnosis not present

## 2022-06-04 DIAGNOSIS — I6529 Occlusion and stenosis of unspecified carotid artery: Secondary | ICD-10-CM | POA: Diagnosis not present

## 2022-06-04 DIAGNOSIS — E538 Deficiency of other specified B group vitamins: Secondary | ICD-10-CM | POA: Diagnosis not present

## 2022-06-04 DIAGNOSIS — I130 Hypertensive heart and chronic kidney disease with heart failure and stage 1 through stage 4 chronic kidney disease, or unspecified chronic kidney disease: Secondary | ICD-10-CM | POA: Diagnosis not present

## 2022-06-04 DIAGNOSIS — E785 Hyperlipidemia, unspecified: Secondary | ICD-10-CM | POA: Diagnosis not present

## 2022-06-04 DIAGNOSIS — Z955 Presence of coronary angioplasty implant and graft: Secondary | ICD-10-CM | POA: Diagnosis not present

## 2022-06-04 DIAGNOSIS — J441 Chronic obstructive pulmonary disease with (acute) exacerbation: Secondary | ICD-10-CM | POA: Diagnosis not present

## 2022-06-04 DIAGNOSIS — E0965 Drug or chemical induced diabetes mellitus with hyperglycemia: Secondary | ICD-10-CM | POA: Diagnosis not present

## 2022-06-04 DIAGNOSIS — N1832 Chronic kidney disease, stage 3b: Secondary | ICD-10-CM | POA: Diagnosis not present

## 2022-06-04 DIAGNOSIS — Z7951 Long term (current) use of inhaled steroids: Secondary | ICD-10-CM | POA: Diagnosis not present

## 2022-06-04 DIAGNOSIS — Z7901 Long term (current) use of anticoagulants: Secondary | ICD-10-CM | POA: Diagnosis not present

## 2022-06-04 DIAGNOSIS — Z86010 Personal history of colonic polyps: Secondary | ICD-10-CM | POA: Diagnosis not present

## 2022-06-04 DIAGNOSIS — I2510 Atherosclerotic heart disease of native coronary artery without angina pectoris: Secondary | ICD-10-CM | POA: Diagnosis not present

## 2022-06-04 DIAGNOSIS — I5043 Acute on chronic combined systolic (congestive) and diastolic (congestive) heart failure: Secondary | ICD-10-CM | POA: Diagnosis not present

## 2022-06-04 DIAGNOSIS — Z9981 Dependence on supplemental oxygen: Secondary | ICD-10-CM | POA: Diagnosis not present

## 2022-06-04 DIAGNOSIS — Z794 Long term (current) use of insulin: Secondary | ICD-10-CM | POA: Diagnosis not present

## 2022-06-04 DIAGNOSIS — E0922 Drug or chemical induced diabetes mellitus with diabetic chronic kidney disease: Secondary | ICD-10-CM | POA: Diagnosis not present

## 2022-06-04 DIAGNOSIS — E0951 Drug or chemical induced diabetes mellitus with diabetic peripheral angiopathy without gangrene: Secondary | ICD-10-CM | POA: Diagnosis not present

## 2022-06-04 DIAGNOSIS — I4821 Permanent atrial fibrillation: Secondary | ICD-10-CM | POA: Diagnosis not present

## 2022-06-04 DIAGNOSIS — Z7984 Long term (current) use of oral hypoglycemic drugs: Secondary | ICD-10-CM | POA: Diagnosis not present

## 2022-06-04 DIAGNOSIS — J9621 Acute and chronic respiratory failure with hypoxia: Secondary | ICD-10-CM | POA: Diagnosis not present

## 2022-06-04 DIAGNOSIS — I255 Ischemic cardiomyopathy: Secondary | ICD-10-CM | POA: Diagnosis not present

## 2022-06-05 ENCOUNTER — Other Ambulatory Visit: Payer: Self-pay

## 2022-06-05 ENCOUNTER — Encounter: Payer: Self-pay | Admitting: Nurse Practitioner

## 2022-06-05 ENCOUNTER — Ambulatory Visit: Payer: Medicare Other | Attending: Cardiology | Admitting: Nurse Practitioner

## 2022-06-05 VITALS — BP 98/58 | HR 52 | Ht 68.0 in | Wt 168.0 lb

## 2022-06-05 DIAGNOSIS — J439 Emphysema, unspecified: Secondary | ICD-10-CM

## 2022-06-05 DIAGNOSIS — Z9889 Other specified postprocedural states: Secondary | ICD-10-CM | POA: Diagnosis not present

## 2022-06-05 DIAGNOSIS — I5032 Chronic diastolic (congestive) heart failure: Secondary | ICD-10-CM

## 2022-06-05 DIAGNOSIS — I2510 Atherosclerotic heart disease of native coronary artery without angina pectoris: Secondary | ICD-10-CM | POA: Diagnosis not present

## 2022-06-05 DIAGNOSIS — E785 Hyperlipidemia, unspecified: Secondary | ICD-10-CM

## 2022-06-05 DIAGNOSIS — Z8679 Personal history of other diseases of the circulatory system: Secondary | ICD-10-CM | POA: Diagnosis not present

## 2022-06-05 DIAGNOSIS — I714 Abdominal aortic aneurysm, without rupture, unspecified: Secondary | ICD-10-CM

## 2022-06-05 DIAGNOSIS — I10 Essential (primary) hypertension: Secondary | ICD-10-CM | POA: Diagnosis not present

## 2022-06-05 DIAGNOSIS — I4821 Permanent atrial fibrillation: Secondary | ICD-10-CM | POA: Diagnosis not present

## 2022-06-05 NOTE — Progress Notes (Unsigned)
Office Visit    Patient Name: Jason Fisher Date of Encounter: 06/05/2022  Primary Care Provider:  Philip Aspen, Limmie Patricia, MD Primary Cardiologist:  Peter Swaziland, MD  Chief Complaint    82 year old male with a history of CAD s/p BMS-RCA in 2007, DES-RCA in 2009 (ISR), DES-OM2 in 2017, chronic diastolic heart failure, chronic atrial fibrillation on Eliquis, hypertension, hyperlipidemia, AAA, COPD, and tobacco use who presents for follow-up related to CAD and atrial fibrillation.  Past Medical History    Past Medical History:  Diagnosis Date   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 2007 Inf STEMI s/p Vision BMS  2 to RCA;  b. 2009 ISR Prox RCA Stent-->with DES;  c. 09/2015 PCI: LM nl, LAD 30p/m, D1 90, LCX nl, OM1 small, 80, OM2 90 (2.5x14 Biofreedom stent), OM3 small, RCA 30p ISR, 22m ISR, 23m, 85d, EF 50-55%.   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 On Eliquis (CHA2DS2VASc = 5).  Rate controlled w/ BB and CCB.   Chronic diastolic CHF (congestive heart failure) (HCC)    a. 09/2015 Echo: EF 55-60%, mildly dil LA.   CKD (chronic kidney disease), stage III (HCC)    they mentioned this to the patient but they would work on it later after the aneurysm   COLONIC POLYPS, HX OF 11/02/2006   COPD (chronic obstructive pulmonary disease) (HCC)    DIABETES MELLITUS, TYPE II 10/29/2006   Diverticulosis    History of tobacco abuse    a. Quit 09/2015.   HYPERLIPIDEMIA 10/29/2006   Hypertension    Hypertensive heart disease with heart failure (HCC)    Pneumonia 09/2015   [name] 07/31/2016   STEMI (ST elevation myocardial infarction) [facility]) 2007   [name] 07/31/2016   Past Surgical History:  Procedure Laterality Date   ABDOMINAL AORTIC ENDOVASCULAR STENT GRAFT N/A 07/31/2016   Procedure: ABDOMINAL AORTIC ENDOVASCULAR STENT GRAFT;  Surgeon: Nada Libman, MD;  Location: [facility] OR;   Service: Vascular;  Laterality: N/A;   CARDIAC CATHETERIZATION N/A 09/26/2015   Procedure: Left Heart Cath and Coronary Angiography;  Surgeon: Peter M Swaziland, MD;  Location: [facility] INVASIVE CV LAB;  Service: Cardiovascular;  Laterality: N/A;   CARDIAC CATHETERIZATION N/A 09/26/2015   Procedure: Coronary Stent Intervention;  Surgeon: Peter M Swaziland, MD;  Location: [facility] INVASIVE CV LAB;  Service: Cardiovascular;  Laterality: N/A;   CATARACT EXTRACTION Bilateral    COLONOSCOPY     CORONARY ANGIOPLASTY WITH STENT PLACEMENT  2007   Inferior STEMI 2007 - RCA PCI with 2 Vision BMS; Abnormal Myoview in 2009 - pRCA ins-stent restenosis & unstented disease - DES PCI Promus 3.0 x 20 mm (3.5 mm)   ENDOVASCULAR REPAIR/STENT GRAFT  07/31/2016   INCISION AND DRAINAGE PERIRECTAL ABSCESS N/A 11/06/2015   Procedure: IRRIGATION AND DEBRIDEMENT PERIRECTAL ABSCESS, POSSIBLE HEMORRHOIDECTOMY;  Surgeon: Abigail Miyamoto, MD;  Location: [facility] OR;  Service: General;  Laterality: N/A;   ORIF SHOULDER FRACTURE Right    "fell out of back end of a truck"    Allergies  Allergies  Allergen Reactions   Contrast Media [Iodinated Contrast Media] Anaphylaxis     Labs/Other Studies Reviewed    The following studies were reviewed today:  Cath 09/26/2015: Prox LAD to Mid LAD lesion, 30 %stenosed. 1st Diag lesion, 90 %stenosed. Ost 1st Mrg to 1st Mrg lesion, 80 %stenosed. Ost RCA to  Prox RCA lesion, 30 %stenosed. Mid RCA-2 lesion, 40 %stenosed. Mid RCA-1 lesion, 15 %stenosed. 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 There is mild left ventricular systolic dysfunction. The left ventricular ejection fraction is 50-55% by visual estimate. LV end diastolic pressure is moderately elevated. A drug eluting stent was successfully placed. 2nd Mrg lesion, 90 %stenosed. Post intervention, there is a 0% residual stenosis.   1. 3 vessel obstructive CAD.     - chronic severe stenosis in the first diagonal     - New 90% stenosis in a large second OM    - Stents in the proximal and mid RCA are patent    - Aneurysm and stenosis in the PLOM branch of the RCA- the aneurysm is new compared to 2009 2. Mild LV dysfunction 3. Elevated LVEDP 4. Allergic response to contrast with hives and bronchospasm. 5. Successful stenting of the second OM with a Biofreedom stent.    Plan: continue IV diuresis. Patient received steroids, Benadryl, and IV pepcid for contrast reaction. Continue DAPT for one month then DC ASA. Resume Eliquis in am.        Echo 11/05/2017: LV EF: 60% -   65% Study Conclusions   - Left ventricle: The cavity size was normal. There was moderate   concentric hypertrophy. Systolic function was normal. The   estimated ejection fraction was in the range of 60% to 65%. Wall   motion was normal; there were no regional wall motion   abnormalities. The study was not technically sufficient to allow   evaluation of LV diastolic dysfunction due to atrial   fibrillation. - Aortic valve: Poorly visualized. Trileaflet; moderately   thickened, moderately calcified leaflets. - Aorta: Aortic root dimension: 40 mm (ED). Ascending aortic   diameter: 38 mm (S). - Aortic root: The aortic root was mildly dilated. - Ascending aorta: The ascending aorta was mildly dilated. - Left atrium: The atrium was moderately dilated. - Right atrium: The atrium was moderately dilated. - Pulmonic valve: There was mild regurgitation. - Pulmonary arteries: Systolic pressure could not be accurately   estimated.     Echo 05/11/2020:  1. Left ventricular ejection fraction, by estimation, is 50 to 55%. The  left ventricle has low normal function. The left ventricle has no regional  wall motion abnormalities. There is moderate left ventricular hypertrophy.  Left ventricular diastolic  function could not be evaluated.   2. Right ventricular systolic function is normal. The right ventricular  size is normal. There is normal  pulmonary artery systolic pressure.   3. Left atrial size was mildly dilated.   4. The mitral valve is grossly normal. Trivial mitral valve  regurgitation.   5. The aortic valve is grossly normal. Aortic valve regurgitation is  trivial.   6. The inferior vena cava is dilated in size with >50% respiratory  variability, suggesting right atrial pressure of 8 mmHg.    Recent Labs: 10/13/2021: TSH 3.210 04/27/2022: ALT 20; B Natriuretic Peptide 747.0 04/28/2022: Hemoglobin 10.6; Platelets 218 05/01/2022: BUN 48; Creatinine, Ser 1.87; Magnesium 2.4; Potassium 3.7; Sodium 146  Recent Lipid Panel    Component Value Date/Time   CHOL 112 10/13/2021 1119   TRIG 70 10/13/2021 1119   HDL 39 (L) 10/13/2021 1119   CHOLHDL 2.9 10/13/2021 1119   CHOLHDL 3 02/10/2021 1404   VLDL 18.8 02/10/2021 1404   LDLCALC 58 10/13/2021 1119    History of Present Illness    82 year old male with  the above past medical history including CAD s/p BMS-RCA in 2007, DES-RCA in 2009 (ISR), DES-OM2 in 2017, chronic diastolic heart failure, chronic atrial fibrillation on Eliquis, hypertension, hyperlipidemia, AAA s/p repair, COPD, and tobacco use.  He was previously admitted in March 2018 for atrial fibrillation with RVR.  He underwent AAA endovascular stent grafting by Dr. Myra Gianotti in June 2018.  Echocardiogram in 11/21/2017 showed EF 60 to 65%, mildly increased aortic root measuring 40 mm, moderately dilated left and right atrium.  He was hospitalized in April 2022 in the setting of worsening dyspnea on exertion, acute on chronic diastolic heart failure, atrial fibrillation with RVR.  Echocardiogram during that admission showed EF 50 to 55%, no RWMA, moderate LVH.  He was last seen in the office on 11/20/2021 and noted shortness of breath and intermittent dizziness.  He also noted some chest tightness, improved with Imdur.  It was noted that should he have progressive symptoms, he would likely require repeat cardiac  catheterization (of note patient has a history of contrast reaction).  He was hospitalized in March 2024 in the setting of acute respiratory failure with hypoxia secondary to pneumonia, COPD exacerbation, atrial fibrillation with RVR.  He was treated with steroids and antibiotics.  HR improved with IV diltiazem.  He saw his PCP on 05/14/2022 and noted insomnia, behavioral disturbances and hallucinations.  He was referred to neurology.  He presents today for follow-up accompanied by his wife.  Since his last visit and since his hospitalization has been stable from a cardiac standpoint.  He does note stable intermittent lightheadedness with position changes (this is not new).  He notes stable chronic dyspnea on exertion.  He has mild nonpitting bilateral lower extremity edema, denies chest pain, PND, orthopnea, weight gain.  BP and HR are low in office today.  His diltiazem was increased during his most recent hospital stay.  BP has been stable at home.   Home Medications    Current Outpatient Medications  Medication Sig Dispense Refill   ACCU-CHEK AVIVA PLUS test strip CHECK BLOOD SUGAR ONCE DAILY. DX E11.51 100 strip 2   apixaban (ELIQUIS) 2.5 MG TABS tablet TAKE ONE TABLET BY MOUTH AT BREAKFAST AND AT BEDTIME 180 tablet 1   atorvastatin (LIPITOR) 40 MG tablet TAKE ONE TABLET BY MOUTH ONCE DAILY 90 tablet 1   Continuous Blood Gluc Receiver (DEXCOM G6 RECEIVER) DEVI CHECK VLOOD SUGAR AS DIRECTED AS DIRECTED 1 each 0   Continuous Blood Gluc Sensor (DEXCOM G6 SENSOR) MISC APPLY 1 SENSOR EVERY 10 DAYS 3 each 2   Continuous Blood Gluc Transmit (DEXCOM G6 TRANSMITTER) MISC APPLY TO SENSOR EVERY 90 DAYS 1 each 0   Cyanocobalamin (B-12 COMPLIANCE INJECTION IJ) Inject as directed every 30 (thirty) days.     dextromethorphan (DELSYM) 30 MG/5ML liquid Take 5 mLs (30 mg total) by mouth 2 (two) times daily as needed for cough. 89 mL 1   diltiazem (CARDIZEM CD) 360 MG 24 hr capsule Take 1 capsule (360 mg total) by  mouth daily. 30 capsule 1   folic acid (FOLVITE) 1 MG tablet Take 1 tablet (1 mg total) by mouth daily. 30 tablet 1   furosemide (LASIX) 80 MG tablet TAKE ONE TABLET BY MOUTH ONCE DAILY 90 tablet 1   insulin degludec (TRESIBA FLEXTOUCH) 100 UNIT/ML FlexTouch Pen Inject 25 Units into the skin at bedtime. 15 mL 2   ipratropium-albuterol (DUONEB) 0.5-2.5 (3) MG/3ML SOLN Take 3 mLs by nebulization every 4 (four) hours as needed (wheezing, shortness  of breath, cough). 360 mL 1   isosorbide mononitrate (IMDUR) 30 MG 24 hr tablet Take 1 tablet (30 mg total) by mouth daily. 90 tablet 3   levalbuterol (XOPENEX HFA) 45 MCG/ACT inhaler INHALE 1 TO 2 PUFFS BY MOUTH EVERY 6 HOURS AS NEEDED FOR WHEEZE 15 each 3   metFORMIN (GLUCOPHAGE) 1000 MG tablet TAKE ONE TABLET BY MOUTH AT BREAKFAST AND AT BEDTIME 180 tablet 1   metoprolol tartrate (LOPRESSOR) 100 MG tablet TAKE ONE TABLET BY MOUTH AT BREAKFAST AND AT BEDTIME 180 tablet 3   nitroGLYCERIN (NITROSTAT) 0.4 MG SL tablet Place 1 tablet (0.4 mg total) under the tongue every 5 (five) minutes as needed for chest pain. X 3 doses 25 tablet 4   polyethylene glycol (MIRALAX / GLYCOLAX) packet Take 17 g by mouth daily as needed for mild constipation.     potassium chloride SA (KLOR-CON M) 20 MEQ tablet Take 1 tablet (20 mEq total) by mouth every other day. 15 tablet 0   QUEtiapine (SEROQUEL) 25 MG tablet Take 0.5 tablets (12.5 mg total) by mouth at bedtime. 30 tablet 1   Tiotropium Bromide-Olodaterol (STIOLTO RESPIMAT) 2.5-2.5 MCG/ACT AERS Inhale 2 puffs into the lungs daily. 4 g 5   No current facility-administered medications for this visit.     Review of Systems    He denies chest pain, palpitations, pnd, orthopnea, n, v, dizziness, syncope, weight gain, or early satiety. All other systems reviewed and are otherwise negative except as noted above.   Physical Exam    VS:  BP (!) 98/58   Pulse (!) 52   Ht 5\' 8"  (1.727 m)   Wt 168 lb (76.2 kg)   BMI 25.54  kg/m  GEN: Well nourished, well developed, in no acute distress. HEENT: normal. Neck: Supple, no JVD, carotid bruits, or masses. Cardiac: IRIR, no murmurs, rubs, or gallops. No clubbing, cyanosis, nonpitting bilateral lower extremity edema. Radials/DP/PT 2+ and equal bilaterally.  Respiratory:  Respirations regular and unlabored, clear to auscultation bilaterally. GI: Soft, nontender, nondistended, BS + x 4. MS: no deformity or atrophy. Skin: warm and dry, no rash. Neuro:  Strength and sensation are intact. Psych: Normal affect.  Accessory Clinical Findings    ECG personally reviewed by me today -atrial fibrillation, 52 bpm- no acute changes.   Lab Results  Component Value Date   WBC 11.7 (H) 04/28/2022   HGB 10.6 (L) 04/28/2022   HCT 32.6 (L) 04/28/2022   MCV 89.8 04/28/2022   PLT 218 04/28/2022   Lab Results  Component Value Date   CREATININE 1.87 (H) 05/01/2022   BUN 48 (H) 05/01/2022   NA 146 (H) 05/01/2022   K 3.7 05/01/2022   CL 107 05/01/2022   CO2 29 05/01/2022   Lab Results  Component Value Date   ALT 20 04/27/2022   AST 24 04/27/2022   ALKPHOS 71 04/27/2022   BILITOT 0.8 04/27/2022   Lab Results  Component Value Date   CHOL 112 10/13/2021   HDL 39 (L) 10/13/2021   LDLCALC 58 10/13/2021   TRIG 70 10/13/2021   CHOLHDL 2.9 10/13/2021    Lab Results  Component Value Date   HGBA1C 6.8 (H) 04/29/2022    Assessment & Plan    1. CAD: S/p BMS-RCA in 2007, DES-RCA in 2009 (ISR), DES-OM2 in 2017.  Stable chronic dyspnea on exertion, denies chest pain.  ASA in the setting of chronic DOAC therapy.  Continue metoprolol, diltiazem, Imdur, and Lipitor.  2. Chronic diastolic heart  failure: Most recent echo in echo during that admission showed EF 50 to 55%, no RWMA, moderate LVH.  He notes stable chronic dyspnea on exertion.  He has mild nonpitting bilateral lower extremity edema, denies chest pain, PND, orthopnea, weight gain. Generally euvolemic and well  compensated on exam.  Continue current medications as above, continue Lasix.  3. Chronic atrial fibrillation: Recent A-fib with RVR in the setting of pneumonia, COPD exacerbation.  Now rate controlled.   BP and HR are borderline low in office today, have been well-controlled at home.  I advised him and his wife to continue to monitor BP and report SBP consistently less than 100, HR consistently less than 50 bpm.  If BP and HR remain low, consider decreasing diltiazem.  Continue metoprolol, diltiazem, Eliquis.  4. Hypertension: BP well controlled, low in office today.  Reports stable mild orthostatic dizziness.  Continue to monitor BP/HR as above.  For now, continue current antihypertensive regimen.   5. Hyperlipidemia: LDL was 58 in 10/2021.  Continue Lipitor.  6. AAA: S/p repair in 2018. Follows with vascular surgery.   7. COPD/tobacco use: He has stable chronic dyspnea.  He is no longer smoking following his recent hospitalization.  Follows with pulmonology.   8. Disposition: Follow-up in 3 to 4 months.      [name], NP 06/05/2022, 5:25 PM

## 2022-06-05 NOTE — Patient Instructions (Signed)
Medication Instructions:  Your physician recommends that you continue on your current medications as directed. Please refer to the Current Medication list given to you today.  *If you need a refill on your cardiac medications before your next appointment, please call your pharmacy*   Lab Work: NONE ordered at this time of appointment   If you have labs (blood work) drawn today and your tests are completely normal, you will receive your results only by: MyChart Message (if you have MyChart) OR A paper copy in the mail If you have any lab test that is abnormal or we need to change your treatment, we will call you to review the results.   Testing/Procedures: NONE ordered at this time of appointment     Follow-Up: At [facility], you and your health needs are our priority.  As part of our continuing mission to provide you with exceptional heart care, we have created designated Provider Care Teams.  These Care Teams include your primary Cardiologist (physician) and Advanced Practice Providers (APPs -  Physician Assistants and Nurse Practitioners) who all work together to provide you with the care you need, when you need it.  We recommend signing up for the patient portal called "MyChart".  Sign up information is provided on this After Visit Summary.  MyChart is used to connect with patients for Virtual Visits (Telemedicine).  Patients are able to view lab/test results, encounter notes, upcoming appointments, etc.  Non-urgent messages can be sent to your provider as well.   To learn more about what you can do with MyChart, go to [email or web address].    Your next appointment:   3-4 month(s)  Provider:   Bernadene Person, NP        Other Instructions Monitor blood pressure. Report systolic BP (top number) less than 100 and report heart rate less than 50 BPM.

## 2022-06-07 ENCOUNTER — Encounter: Payer: Self-pay | Admitting: Nurse Practitioner

## 2022-06-10 DIAGNOSIS — J449 Chronic obstructive pulmonary disease, unspecified: Secondary | ICD-10-CM | POA: Diagnosis not present

## 2022-06-11 DIAGNOSIS — Z7951 Long term (current) use of inhaled steroids: Secondary | ICD-10-CM | POA: Diagnosis not present

## 2022-06-11 DIAGNOSIS — Z794 Long term (current) use of insulin: Secondary | ICD-10-CM | POA: Diagnosis not present

## 2022-06-11 DIAGNOSIS — I130 Hypertensive heart and chronic kidney disease with heart failure and stage 1 through stage 4 chronic kidney disease, or unspecified chronic kidney disease: Secondary | ICD-10-CM | POA: Diagnosis not present

## 2022-06-11 DIAGNOSIS — E0922 Drug or chemical induced diabetes mellitus with diabetic chronic kidney disease: Secondary | ICD-10-CM | POA: Diagnosis not present

## 2022-06-11 DIAGNOSIS — E538 Deficiency of other specified B group vitamins: Secondary | ICD-10-CM | POA: Diagnosis not present

## 2022-06-11 DIAGNOSIS — Z9981 Dependence on supplemental oxygen: Secondary | ICD-10-CM | POA: Diagnosis not present

## 2022-06-11 DIAGNOSIS — J9621 Acute and chronic respiratory failure with hypoxia: Secondary | ICD-10-CM | POA: Diagnosis not present

## 2022-06-11 DIAGNOSIS — I2510 Atherosclerotic heart disease of native coronary artery without angina pectoris: Secondary | ICD-10-CM | POA: Diagnosis not present

## 2022-06-11 DIAGNOSIS — Z86010 Personal history of colonic polyps: Secondary | ICD-10-CM | POA: Diagnosis not present

## 2022-06-11 DIAGNOSIS — Z87891 Personal history of nicotine dependence: Secondary | ICD-10-CM | POA: Diagnosis not present

## 2022-06-11 DIAGNOSIS — J441 Chronic obstructive pulmonary disease with (acute) exacerbation: Secondary | ICD-10-CM | POA: Diagnosis not present

## 2022-06-11 DIAGNOSIS — E785 Hyperlipidemia, unspecified: Secondary | ICD-10-CM | POA: Diagnosis not present

## 2022-06-11 DIAGNOSIS — I255 Ischemic cardiomyopathy: Secondary | ICD-10-CM | POA: Diagnosis not present

## 2022-06-11 DIAGNOSIS — Z7901 Long term (current) use of anticoagulants: Secondary | ICD-10-CM | POA: Diagnosis not present

## 2022-06-11 DIAGNOSIS — E0965 Drug or chemical induced diabetes mellitus with hyperglycemia: Secondary | ICD-10-CM | POA: Diagnosis not present

## 2022-06-11 DIAGNOSIS — I5043 Acute on chronic combined systolic (congestive) and diastolic (congestive) heart failure: Secondary | ICD-10-CM | POA: Diagnosis not present

## 2022-06-11 DIAGNOSIS — Z955 Presence of coronary angioplasty implant and graft: Secondary | ICD-10-CM | POA: Diagnosis not present

## 2022-06-11 DIAGNOSIS — I6529 Occlusion and stenosis of unspecified carotid artery: Secondary | ICD-10-CM | POA: Diagnosis not present

## 2022-06-11 DIAGNOSIS — I4821 Permanent atrial fibrillation: Secondary | ICD-10-CM | POA: Diagnosis not present

## 2022-06-11 DIAGNOSIS — N1832 Chronic kidney disease, stage 3b: Secondary | ICD-10-CM | POA: Diagnosis not present

## 2022-06-11 DIAGNOSIS — Z7984 Long term (current) use of oral hypoglycemic drugs: Secondary | ICD-10-CM | POA: Diagnosis not present

## 2022-06-11 DIAGNOSIS — J9612 Chronic respiratory failure with hypercapnia: Secondary | ICD-10-CM | POA: Diagnosis not present

## 2022-06-11 DIAGNOSIS — E0951 Drug or chemical induced diabetes mellitus with diabetic peripheral angiopathy without gangrene: Secondary | ICD-10-CM | POA: Diagnosis not present

## 2022-06-12 ENCOUNTER — Telehealth: Payer: Self-pay | Admitting: Internal Medicine

## 2022-06-12 NOTE — Telephone Encounter (Signed)
Corwin Levins with [facility]  [phone]  PT Missed visit   Will try to reschedule for next week.

## 2022-06-16 DIAGNOSIS — J441 Chronic obstructive pulmonary disease with (acute) exacerbation: Secondary | ICD-10-CM | POA: Diagnosis not present

## 2022-06-16 DIAGNOSIS — E0922 Drug or chemical induced diabetes mellitus with diabetic chronic kidney disease: Secondary | ICD-10-CM | POA: Diagnosis not present

## 2022-06-16 DIAGNOSIS — I130 Hypertensive heart and chronic kidney disease with heart failure and stage 1 through stage 4 chronic kidney disease, or unspecified chronic kidney disease: Secondary | ICD-10-CM | POA: Diagnosis not present

## 2022-06-16 DIAGNOSIS — Z9981 Dependence on supplemental oxygen: Secondary | ICD-10-CM | POA: Diagnosis not present

## 2022-06-16 DIAGNOSIS — Z7901 Long term (current) use of anticoagulants: Secondary | ICD-10-CM | POA: Diagnosis not present

## 2022-06-16 DIAGNOSIS — E538 Deficiency of other specified B group vitamins: Secondary | ICD-10-CM | POA: Diagnosis not present

## 2022-06-16 DIAGNOSIS — J9612 Chronic respiratory failure with hypercapnia: Secondary | ICD-10-CM | POA: Diagnosis not present

## 2022-06-16 DIAGNOSIS — E0965 Drug or chemical induced diabetes mellitus with hyperglycemia: Secondary | ICD-10-CM | POA: Diagnosis not present

## 2022-06-16 DIAGNOSIS — I255 Ischemic cardiomyopathy: Secondary | ICD-10-CM | POA: Diagnosis not present

## 2022-06-16 DIAGNOSIS — Z955 Presence of coronary angioplasty implant and graft: Secondary | ICD-10-CM | POA: Diagnosis not present

## 2022-06-16 DIAGNOSIS — E0951 Drug or chemical induced diabetes mellitus with diabetic peripheral angiopathy without gangrene: Secondary | ICD-10-CM | POA: Diagnosis not present

## 2022-06-16 DIAGNOSIS — I5043 Acute on chronic combined systolic (congestive) and diastolic (congestive) heart failure: Secondary | ICD-10-CM | POA: Diagnosis not present

## 2022-06-16 DIAGNOSIS — E785 Hyperlipidemia, unspecified: Secondary | ICD-10-CM | POA: Diagnosis not present

## 2022-06-16 DIAGNOSIS — Z87891 Personal history of nicotine dependence: Secondary | ICD-10-CM | POA: Diagnosis not present

## 2022-06-16 DIAGNOSIS — Z794 Long term (current) use of insulin: Secondary | ICD-10-CM | POA: Diagnosis not present

## 2022-06-16 DIAGNOSIS — N1832 Chronic kidney disease, stage 3b: Secondary | ICD-10-CM | POA: Diagnosis not present

## 2022-06-16 DIAGNOSIS — I4821 Permanent atrial fibrillation: Secondary | ICD-10-CM | POA: Diagnosis not present

## 2022-06-16 DIAGNOSIS — J9621 Acute and chronic respiratory failure with hypoxia: Secondary | ICD-10-CM | POA: Diagnosis not present

## 2022-06-16 DIAGNOSIS — I2510 Atherosclerotic heart disease of native coronary artery without angina pectoris: Secondary | ICD-10-CM | POA: Diagnosis not present

## 2022-06-16 DIAGNOSIS — Z7951 Long term (current) use of inhaled steroids: Secondary | ICD-10-CM | POA: Diagnosis not present

## 2022-06-16 DIAGNOSIS — Z7984 Long term (current) use of oral hypoglycemic drugs: Secondary | ICD-10-CM | POA: Diagnosis not present

## 2022-06-16 DIAGNOSIS — I6529 Occlusion and stenosis of unspecified carotid artery: Secondary | ICD-10-CM | POA: Diagnosis not present

## 2022-06-16 DIAGNOSIS — Z86010 Personal history of colonic polyps: Secondary | ICD-10-CM | POA: Diagnosis not present

## 2022-06-18 DIAGNOSIS — Z7901 Long term (current) use of anticoagulants: Secondary | ICD-10-CM | POA: Diagnosis not present

## 2022-06-18 DIAGNOSIS — I6529 Occlusion and stenosis of unspecified carotid artery: Secondary | ICD-10-CM | POA: Diagnosis not present

## 2022-06-18 DIAGNOSIS — Z7951 Long term (current) use of inhaled steroids: Secondary | ICD-10-CM | POA: Diagnosis not present

## 2022-06-18 DIAGNOSIS — I130 Hypertensive heart and chronic kidney disease with heart failure and stage 1 through stage 4 chronic kidney disease, or unspecified chronic kidney disease: Secondary | ICD-10-CM | POA: Diagnosis not present

## 2022-06-18 DIAGNOSIS — J9612 Chronic respiratory failure with hypercapnia: Secondary | ICD-10-CM | POA: Diagnosis not present

## 2022-06-18 DIAGNOSIS — J9621 Acute and chronic respiratory failure with hypoxia: Secondary | ICD-10-CM | POA: Diagnosis not present

## 2022-06-18 DIAGNOSIS — N1832 Chronic kidney disease, stage 3b: Secondary | ICD-10-CM | POA: Diagnosis not present

## 2022-06-18 DIAGNOSIS — Z86010 Personal history of colonic polyps: Secondary | ICD-10-CM | POA: Diagnosis not present

## 2022-06-18 DIAGNOSIS — Z9981 Dependence on supplemental oxygen: Secondary | ICD-10-CM | POA: Diagnosis not present

## 2022-06-18 DIAGNOSIS — E0951 Drug or chemical induced diabetes mellitus with diabetic peripheral angiopathy without gangrene: Secondary | ICD-10-CM | POA: Diagnosis not present

## 2022-06-18 DIAGNOSIS — E0922 Drug or chemical induced diabetes mellitus with diabetic chronic kidney disease: Secondary | ICD-10-CM | POA: Diagnosis not present

## 2022-06-18 DIAGNOSIS — E0965 Drug or chemical induced diabetes mellitus with hyperglycemia: Secondary | ICD-10-CM | POA: Diagnosis not present

## 2022-06-18 DIAGNOSIS — Z955 Presence of coronary angioplasty implant and graft: Secondary | ICD-10-CM | POA: Diagnosis not present

## 2022-06-18 DIAGNOSIS — I5043 Acute on chronic combined systolic (congestive) and diastolic (congestive) heart failure: Secondary | ICD-10-CM | POA: Diagnosis not present

## 2022-06-18 DIAGNOSIS — Z7984 Long term (current) use of oral hypoglycemic drugs: Secondary | ICD-10-CM | POA: Diagnosis not present

## 2022-06-18 DIAGNOSIS — Z794 Long term (current) use of insulin: Secondary | ICD-10-CM | POA: Diagnosis not present

## 2022-06-18 DIAGNOSIS — E538 Deficiency of other specified B group vitamins: Secondary | ICD-10-CM | POA: Diagnosis not present

## 2022-06-18 DIAGNOSIS — Z87891 Personal history of nicotine dependence: Secondary | ICD-10-CM | POA: Diagnosis not present

## 2022-06-18 DIAGNOSIS — I4821 Permanent atrial fibrillation: Secondary | ICD-10-CM | POA: Diagnosis not present

## 2022-06-18 DIAGNOSIS — I2510 Atherosclerotic heart disease of native coronary artery without angina pectoris: Secondary | ICD-10-CM | POA: Diagnosis not present

## 2022-06-18 DIAGNOSIS — E785 Hyperlipidemia, unspecified: Secondary | ICD-10-CM | POA: Diagnosis not present

## 2022-06-18 DIAGNOSIS — J441 Chronic obstructive pulmonary disease with (acute) exacerbation: Secondary | ICD-10-CM | POA: Diagnosis not present

## 2022-06-18 DIAGNOSIS — I255 Ischemic cardiomyopathy: Secondary | ICD-10-CM | POA: Diagnosis not present

## 2022-06-24 ENCOUNTER — Telehealth: Payer: Self-pay

## 2022-06-24 ENCOUNTER — Ambulatory Visit (INDEPENDENT_AMBULATORY_CARE_PROVIDER_SITE_OTHER): Payer: Medicare Other

## 2022-06-24 DIAGNOSIS — E538 Deficiency of other specified B group vitamins: Secondary | ICD-10-CM

## 2022-06-24 DIAGNOSIS — Z794 Long term (current) use of insulin: Secondary | ICD-10-CM | POA: Diagnosis not present

## 2022-06-24 DIAGNOSIS — Z955 Presence of coronary angioplasty implant and graft: Secondary | ICD-10-CM | POA: Diagnosis not present

## 2022-06-24 DIAGNOSIS — J9612 Chronic respiratory failure with hypercapnia: Secondary | ICD-10-CM | POA: Diagnosis not present

## 2022-06-24 DIAGNOSIS — Z7951 Long term (current) use of inhaled steroids: Secondary | ICD-10-CM | POA: Diagnosis not present

## 2022-06-24 DIAGNOSIS — I4821 Permanent atrial fibrillation: Secondary | ICD-10-CM | POA: Diagnosis not present

## 2022-06-24 DIAGNOSIS — J9621 Acute and chronic respiratory failure with hypoxia: Secondary | ICD-10-CM | POA: Diagnosis not present

## 2022-06-24 DIAGNOSIS — Z9981 Dependence on supplemental oxygen: Secondary | ICD-10-CM | POA: Diagnosis not present

## 2022-06-24 DIAGNOSIS — N1832 Chronic kidney disease, stage 3b: Secondary | ICD-10-CM | POA: Diagnosis not present

## 2022-06-24 DIAGNOSIS — I255 Ischemic cardiomyopathy: Secondary | ICD-10-CM | POA: Diagnosis not present

## 2022-06-24 DIAGNOSIS — I2510 Atherosclerotic heart disease of native coronary artery without angina pectoris: Secondary | ICD-10-CM | POA: Diagnosis not present

## 2022-06-24 DIAGNOSIS — I130 Hypertensive heart and chronic kidney disease with heart failure and stage 1 through stage 4 chronic kidney disease, or unspecified chronic kidney disease: Secondary | ICD-10-CM | POA: Diagnosis not present

## 2022-06-24 DIAGNOSIS — I5043 Acute on chronic combined systolic (congestive) and diastolic (congestive) heart failure: Secondary | ICD-10-CM | POA: Diagnosis not present

## 2022-06-24 DIAGNOSIS — E0922 Drug or chemical induced diabetes mellitus with diabetic chronic kidney disease: Secondary | ICD-10-CM | POA: Diagnosis not present

## 2022-06-24 DIAGNOSIS — J441 Chronic obstructive pulmonary disease with (acute) exacerbation: Secondary | ICD-10-CM | POA: Diagnosis not present

## 2022-06-24 DIAGNOSIS — Z87891 Personal history of nicotine dependence: Secondary | ICD-10-CM | POA: Diagnosis not present

## 2022-06-24 DIAGNOSIS — E0951 Drug or chemical induced diabetes mellitus with diabetic peripheral angiopathy without gangrene: Secondary | ICD-10-CM | POA: Diagnosis not present

## 2022-06-24 DIAGNOSIS — E785 Hyperlipidemia, unspecified: Secondary | ICD-10-CM | POA: Diagnosis not present

## 2022-06-24 DIAGNOSIS — I6529 Occlusion and stenosis of unspecified carotid artery: Secondary | ICD-10-CM | POA: Diagnosis not present

## 2022-06-24 DIAGNOSIS — Z7984 Long term (current) use of oral hypoglycemic drugs: Secondary | ICD-10-CM | POA: Diagnosis not present

## 2022-06-24 DIAGNOSIS — E0965 Drug or chemical induced diabetes mellitus with hyperglycemia: Secondary | ICD-10-CM | POA: Diagnosis not present

## 2022-06-24 DIAGNOSIS — Z7901 Long term (current) use of anticoagulants: Secondary | ICD-10-CM | POA: Diagnosis not present

## 2022-06-24 DIAGNOSIS — Z86010 Personal history of colonic polyps: Secondary | ICD-10-CM | POA: Diagnosis not present

## 2022-06-24 MED ORDER — CYANOCOBALAMIN 1000 MCG/ML IJ SOLN
1000.0000 ug | Freq: Once | INTRAMUSCULAR | Status: AC
  Administered 2022-06-24: 1000 ug via INTRAMUSCULAR

## 2022-06-24 NOTE — Progress Notes (Signed)
Per orders of Dr. Hernandez, injection of Cyanocobalamin 1000 mcg given by Desirai Traxler L Imogene Gravelle. Patient tolerated injection well.  

## 2022-06-24 NOTE — Progress Notes (Signed)
Patient ID: Jason Fisher, male   DOB: [birth year], 82 y.o.   MRN: [id number]   Care Management & Coordination Services Pharmacy Team  Reason for Encounter: General adherence update/ Medication Coordination   Spoke with Wife Jason Fisher on 06/24/2022   Last adherence delivery date:06/04/22      Patient is due for next adherence delivery on: 07/06/22  This delivery to include: Adherence Packaging  30 Days  Metformin (Glucophage)1000 mg : Take one tablet at Breakfast and at bedtime Eliquis 2.5 mg : Take one tablet at Breakfast and at Bedtime Furosemide  (Lasix) 80 mg : Take one tablet at Breakfast Metoprolol (Lopressor) 100 mg : Take one tablet at Breakfast and at Dinner Isosorbide Mono 30 mg: Take one at breakfast  Atorvastatin (Lipitor) 40 mg : Take one tablet at Breakfast Diltiazem (Cardizem) 360 mg : Take one tablet at Breakfast Olmesartan 5 mg: Take 2 tabs(10 mg) daily at breakfast Stiolto Evaristo Bury FlexTouch U-100 insulin 100 unit/mL (3 mL) subcutaneous pen 06/16/2022 29   QUETIAPINE FUMARATE 25 MG TAB 05/14/2022 60    Per wife added Levalbuterol to be added to his order for this month  Confirmed delivery date of 07/06/22, advised patient that pharmacy will contact them the morning of delivery.   Any concerns about your medications? No  How often do you forget or accidentally miss a dose? Never  Is patient in packaging Yes   What concerns do you have about your medications? Wife reports none  The patient denies side effects with their medications.    Since last visit with PharmD, no interventions have been made.   The patient has had an ED visit since last contact.   The patient denies problems with their health.   Patient denies concerns or questions for Jason Fisher, PharmD at this time.   Counseled patient on: Great job taking medications and Access to carecoordination team for any cost, medication or pharmacy concerns.  Pharmacist follow up for June Scheduled  Chart  Updates:  Recent office visits:  06/24/22 Good, Jason Fisher, CMA - Patient presented for B-12 deficiency. B-12 administered no medication changes.   05/25/22 Jason Fisher, CMA - Patient presented for B-12 eficiency. B-12 administered no medication changes.    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 No medication changes.  05/27/22 Jason Bayley, NP (Pulmonology) - Patient presented for right upper lobe pulmonary nodule and other concerns. Ordered CT Chest w/o contrast.  Hospital visits:  Medication Reconciliation was completed by comparing discharge summary, patient's EMR and Pharmacy list, and upon discussion with patient.   Patient presented to [facility] on 04/27/22 due to Acute respiratory failure with hypoxia and hypercarbia. Patient was present for 5 days.   New?Medications Started at [facility] Discharge:?? -started  cyanocobalamin (VITAMIN B12) dextromethorphan (Delsym) folic acid (FOLVITE) guaiFENesin (MUCINEX) ipratropium-albuterol (DUONEB) potassium chloride SA (KLOR-CON M)   Medication Changes at Hospital Discharge: -Changed  diltiazem (CARDIZEM CD) Eliquis (apixaban) Evaristo Bury FlexTouch (insulin degludec)   Medications Discontinued at Hospital Discharge: -Stopped olmesartan 5 MG tablet (BENICAR) predniSONE 50 MG tablet (DELTASONE)   Medications that remain the same after Hospital Discharge:??  -All other medications will remain the same.     Patient presented to [facility] ED at [facility] on 04/23/22 due to Shortness of breath and other concern. Patient was present for 5 hours   New?Medications Started at [facility]  Discharge:?? -started  predniSONE 50 MG tablet   Medication Changes at Hospital Discharge: -Changed  none   Medications Discontinued at Hospital Discharge: -Stopped none   Medications that remain the  same after Hospital Discharge:??  -All other medications will remain the same.   Medications: Outpatient Encounter Medications as of 06/24/2022  Medication Sig   ACCU-CHEK AVIVA PLUS test strip CHECK BLOOD SUGAR ONCE DAILY. DX E11.51   apixaban (ELIQUIS) 2.5 MG TABS tablet TAKE ONE TABLET BY MOUTH AT BREAKFAST AND AT BEDTIME   atorvastatin (LIPITOR) 40 MG tablet TAKE ONE TABLET BY MOUTH ONCE DAILY   Continuous Blood Gluc Receiver (DEXCOM G6 RECEIVER) DEVI CHECK VLOOD SUGAR AS DIRECTED AS DIRECTED   Continuous Blood Gluc Sensor (DEXCOM G6 SENSOR) MISC APPLY 1 SENSOR EVERY 10 DAYS   Continuous Blood Gluc Transmit (DEXCOM G6 TRANSMITTER) MISC APPLY TO SENSOR EVERY 90 DAYS   Cyanocobalamin (B-12 COMPLIANCE INJECTION IJ) Inject as directed every 30 (thirty) days.   dextromethorphan (DELSYM) 30 MG/5ML liquid Take 5 mLs (30 mg total) by mouth 2 (two) times daily as needed for cough.   diltiazem (CARDIZEM CD) 360 MG 24 hr capsule Take 1 capsule (360 mg total) by mouth daily.   folic acid (FOLVITE) 1 MG tablet Take 1 tablet (1 mg total) by mouth daily.   furosemide (LASIX) 80 MG tablet TAKE ONE TABLET BY MOUTH ONCE DAILY   insulin degludec (TRESIBA FLEXTOUCH) 100 UNIT/ML FlexTouch Pen Inject 25 Units into the skin at bedtime.   ipratropium-albuterol (DUONEB) 0.5-2.5 (3) MG/3ML SOLN Take 3 mLs by nebulization every 4 (four) hours as needed (wheezing, shortness of breath, cough).   isosorbide mononitrate (IMDUR) 30 MG 24 hr tablet Take 1 tablet (30 mg total) by mouth daily.   levalbuterol (XOPENEX HFA) 45 MCG/ACT inhaler INHALE 1 TO 2 PUFFS BY MOUTH EVERY 6 HOURS AS NEEDED FOR WHEEZE   metFORMIN (GLUCOPHAGE) 1000 MG tablet TAKE ONE TABLET BY MOUTH AT BREAKFAST AND AT BEDTIME   metoprolol tartrate (LOPRESSOR) 100 MG tablet TAKE ONE TABLET BY MOUTH AT BREAKFAST AND AT BEDTIME   nitroGLYCERIN (NITROSTAT) 0.4 MG SL tablet Place 1 tablet (0.4 mg total) under the tongue every 5 (five) minutes as needed for chest  pain. X 3 doses   polyethylene glycol (MIRALAX / GLYCOLAX) packet Take 17 g by mouth daily as needed for mild constipation.   potassium chloride SA (KLOR-CON M) 20 MEQ tablet Take 1 tablet (20 mEq total) by mouth every other day.   QUEtiapine (SEROQUEL) 25 MG tablet Take 0.5 tablets (12.5 mg total) by mouth at bedtime.   Tiotropium Bromide-Olodaterol (STIOLTO RESPIMAT) 2.5-2.5 MCG/ACT AERS Inhale 2 puffs into the lungs daily.   No facility-administered encounter medications on file as of 06/24/2022.    Recent vitals BP Readings from Last 3 Encounters:  06/05/22 (!) 98/58  05/27/22 112/62  05/14/22 120/88   Pulse Readings from Last 3 Encounters:  06/05/22 (!) 52  05/27/22 62  05/14/22 76   Wt Readings from Last 3 Encounters:  06/05/22 168 lb (76.2 kg)  05/27/22 171 lb 3.2 oz (77.7 kg)  05/14/22 169 lb 8 oz (76.9 kg)   BMI Readings from Last 3 Encounters:  06/05/22 25.54 kg/m  05/27/22 26.03 kg/m  05/14/22 25.77 kg/m    Recent lab results    Component Value Date/Time   NA 146 (H) 05/01/2022 0608   NA 146 (H) 10/13/2021 1118   K 3.7 05/01/2022 0608   CL 107 05/01/2022 1610  CO2 29 05/01/2022 0608   GLUCOSE 138 (H) 05/01/2022 0608   BUN 48 (H) 05/01/2022 0608   BUN 22 10/13/2021 1118   CREATININE 1.87 (H) 05/01/2022 0608   CALCIUM 9.0 05/01/2022 0608    Lab Results  Component Value Date   CREATININE 1.87 (H) 05/01/2022   GFR 46.36 (Fisher) 02/10/2021   EGFR 41 (Fisher) 10/13/2021   GFRNONAA 36 (Fisher) 05/01/2022   GFRAA 56 (Fisher) 03/06/2019   Lab Results  Component Value Date/Time   HGBA1C 6.8 (H) 04/29/2022 04:34 PM   HGBA1C 6.3 (A) 11/20/2021 11:11 AM   HGBA1C 5.9 (A) 08/20/2021 01:19 PM   HGBA1C 6.7 (H) 02/10/2021 02:04 PM   MICROALBUR 15.9 (H) 05/19/2021 02:02 PM   MICROALBUR 3.7 (H) 08/30/2015 08:21 AM    Lab Results  Component Value Date   CHOL 112 10/13/2021   HDL 39 (Fisher) 10/13/2021   LDLCALC 58 10/13/2021   TRIG 70 10/13/2021   CHOLHDL 2.9 10/13/2021     Care Gaps: Zoster Vaccine - Overdue TDAP - Overdue Eye Exam - Overdue Foot Exam - Overdue Diabetic Urine - Overdue AWV - 01/21/22  Star Rating Drugs:  Metformin (Glucophage) 1000 mg - Last filled 06/01/21 30 DS at [facility] Atorvastatin (Lipitor) 40 mg Last filled 06/01/21 30 DS at [facility]     Pamala Duffel CMA Clinical Pharmacist Assistant [phone]

## 2022-06-25 ENCOUNTER — Ambulatory Visit (HOSPITAL_BASED_OUTPATIENT_CLINIC_OR_DEPARTMENT_OTHER)
Admission: RE | Admit: 2022-06-25 | Discharge: 2022-06-25 | Disposition: A | Discharge status: Home / Self Care | Payer: Medicare Other | Source: Ambulatory Visit | Attending: Primary Care | Admitting: Primary Care

## 2022-06-25 DIAGNOSIS — J439 Emphysema, unspecified: Secondary | ICD-10-CM | POA: Diagnosis not present

## 2022-06-25 DIAGNOSIS — J90 Pleural effusion, not elsewhere classified: Secondary | ICD-10-CM | POA: Diagnosis not present

## 2022-06-25 DIAGNOSIS — R911 Solitary pulmonary nodule: Secondary | ICD-10-CM | POA: Diagnosis not present

## 2022-06-25 DIAGNOSIS — R918 Other nonspecific abnormal finding of lung field: Secondary | ICD-10-CM | POA: Diagnosis not present

## 2022-06-26 DIAGNOSIS — J9612 Chronic respiratory failure with hypercapnia: Secondary | ICD-10-CM | POA: Diagnosis not present

## 2022-06-26 DIAGNOSIS — I255 Ischemic cardiomyopathy: Secondary | ICD-10-CM | POA: Diagnosis not present

## 2022-06-26 DIAGNOSIS — N1832 Chronic kidney disease, stage 3b: Secondary | ICD-10-CM | POA: Diagnosis not present

## 2022-06-26 DIAGNOSIS — Z7951 Long term (current) use of inhaled steroids: Secondary | ICD-10-CM | POA: Diagnosis not present

## 2022-06-26 DIAGNOSIS — I2510 Atherosclerotic heart disease of native coronary artery without angina pectoris: Secondary | ICD-10-CM | POA: Diagnosis not present

## 2022-06-26 DIAGNOSIS — I6529 Occlusion and stenosis of unspecified carotid artery: Secondary | ICD-10-CM | POA: Diagnosis not present

## 2022-06-26 DIAGNOSIS — E0965 Drug or chemical induced diabetes mellitus with hyperglycemia: Secondary | ICD-10-CM | POA: Diagnosis not present

## 2022-06-26 DIAGNOSIS — Z7901 Long term (current) use of anticoagulants: Secondary | ICD-10-CM | POA: Diagnosis not present

## 2022-06-26 DIAGNOSIS — E0922 Drug or chemical induced diabetes mellitus with diabetic chronic kidney disease: Secondary | ICD-10-CM | POA: Diagnosis not present

## 2022-06-26 DIAGNOSIS — Z9981 Dependence on supplemental oxygen: Secondary | ICD-10-CM | POA: Diagnosis not present

## 2022-06-26 DIAGNOSIS — Z955 Presence of coronary angioplasty implant and graft: Secondary | ICD-10-CM | POA: Diagnosis not present

## 2022-06-26 DIAGNOSIS — Z794 Long term (current) use of insulin: Secondary | ICD-10-CM | POA: Diagnosis not present

## 2022-06-26 DIAGNOSIS — J441 Chronic obstructive pulmonary disease with (acute) exacerbation: Secondary | ICD-10-CM | POA: Diagnosis not present

## 2022-06-26 DIAGNOSIS — Z87891 Personal history of nicotine dependence: Secondary | ICD-10-CM | POA: Diagnosis not present

## 2022-06-26 DIAGNOSIS — I130 Hypertensive heart and chronic kidney disease with heart failure and stage 1 through stage 4 chronic kidney disease, or unspecified chronic kidney disease: Secondary | ICD-10-CM | POA: Diagnosis not present

## 2022-06-26 DIAGNOSIS — Z86010 Personal history of colonic polyps: Secondary | ICD-10-CM | POA: Diagnosis not present

## 2022-06-26 DIAGNOSIS — E538 Deficiency of other specified B group vitamins: Secondary | ICD-10-CM | POA: Diagnosis not present

## 2022-06-26 DIAGNOSIS — I5043 Acute on chronic combined systolic (congestive) and diastolic (congestive) heart failure: Secondary | ICD-10-CM | POA: Diagnosis not present

## 2022-06-26 DIAGNOSIS — I4821 Permanent atrial fibrillation: Secondary | ICD-10-CM | POA: Diagnosis not present

## 2022-06-26 DIAGNOSIS — E785 Hyperlipidemia, unspecified: Secondary | ICD-10-CM | POA: Diagnosis not present

## 2022-06-26 DIAGNOSIS — J9621 Acute and chronic respiratory failure with hypoxia: Secondary | ICD-10-CM | POA: Diagnosis not present

## 2022-06-26 DIAGNOSIS — E0951 Drug or chemical induced diabetes mellitus with diabetic peripheral angiopathy without gangrene: Secondary | ICD-10-CM | POA: Diagnosis not present

## 2022-06-26 DIAGNOSIS — Z7984 Long term (current) use of oral hypoglycemic drugs: Secondary | ICD-10-CM | POA: Diagnosis not present

## 2022-06-30 ENCOUNTER — Encounter: Payer: Self-pay | Admitting: Neurology

## 2022-06-30 ENCOUNTER — Telehealth: Payer: Self-pay | Admitting: Neurology

## 2022-06-30 ENCOUNTER — Ambulatory Visit: Payer: Medicare Other | Admitting: Neurology

## 2022-06-30 VITALS — BP 121/65 | HR 66 | Ht 68.0 in | Wt 170.5 lb

## 2022-06-30 DIAGNOSIS — E538 Deficiency of other specified B group vitamins: Secondary | ICD-10-CM

## 2022-06-30 DIAGNOSIS — G3184 Mild cognitive impairment, so stated: Secondary | ICD-10-CM | POA: Diagnosis not present

## 2022-06-30 MED ORDER — RIVASTIGMINE TARTRATE 1.5 MG PO CAPS: 1.5000 mg | ORAL_CAPSULE | Freq: Two times a day (BID) | ORAL | 6 refills | Status: DC

## 2022-06-30 NOTE — Progress Notes (Signed)
[facility]  PATIENT: Jason Fisher DOB: Sep 23, 1940  REQUESTING CLINICIAN: Philip Aspen, [place]* HISTORY FROM: Patient, spouse and chart review  REASON FOR VISIT: Memory loss    HISTORICAL  CHIEF COMPLAINT:  Chief Complaint  Patient presents with   New Patient (Initial Visit)    Rm 12, wife present Moca: 13 Memory issues    HISTORY OF PRESENT ILLNESS:  This is a 82 year old gentleman with multiple medical multiple conditions including hypertension, hyperlipidemia, diabetes mellitus type 2, coronary artery disease, atrial fibrillation on apixaban, tobacco use and CKD stage III who is presenting with memory problem.  Patient reported his memory is not what it used to be.  He reports forgetful, misplacing items but since coming back from the hospital his memory got worse.  In March patient was admitted to the hospital for worsening COPD and pneumonia.  Wife reports prior to hospitalization patient was being forgetful also there was report of hallucinations.  He will see bugs and insects crawling on the wall, also seeing small children in the house.  Since discharge from the hospital patient reported he still has hallucinations but is less than before.  The last episode was this morning when he he saw scissor on the bed.   Again wife reports that since discharge he is more forgetful, he repeat himself and wife also has a repeat herself because he asks the same questions over and over.  Patient has not been driving for the past year, when he was driving he denied any accident or being lost in familiar places. He felt like he could not drive anymore.  Same thing for the bills.  He left the wife take over the bill last year.  He denies missing any payments he felt like the wife should know how to handle the bills also. He denies any family history of dementia and there is reports of patient acting out his dream, screaming and punching. They denies recent falls, he does use  a cane    TBI:  No past history of TBI Stroke:   no past history of stroke Seizures:   no past history of seizures Sleep:   no history of sleep apnea.   Mood:   patient denies anxiety and depression Family history of Dementia:   Denies  Functional status: independent in most ADLs and IADLs Patient lives with spouse. Cooking: not anymore  Cleaning: yes Shopping: Yes Bathing: No issues  Toileting: No issues  Driving: No, stopped driving 1 year  Bills: Wife took over the bills 1 year  Medications: no issues Ever left the stove on by accident?: Denies  Forget how to use items around the house?: denies  Getting lost going to familiar places?: Denies  Forgetting loved ones names?: Denies  Word finding difficulty? Yes  Sleep: Some nights does not sleep and he does scream and punch during sleep    OTHER MEDICAL CONDITIONS: Hypertension, hyperlipidemia, diabetes mellitus type 2, coronary artery disease, atrial fibrillation on apixaban, tobacco use and CKD stage III   REVIEW OF SYSTEMS: Full 14 system review of systems performed and negative with exception of: As noted in the HPI  ALLERGIES: Allergies  Allergen Reactions   Contrast Media [Iodinated Contrast Media] Anaphylaxis    HOME MEDICATIONS: Outpatient Medications Prior to Visit  Medication Sig Dispense Refill   ACCU-CHEK AVIVA PLUS test strip CHECK BLOOD SUGAR ONCE DAILY. DX E11.51 100 strip 2   apixaban (ELIQUIS) 2.5 MG TABS tablet TAKE ONE TABLET BY MOUTH  AT BREAKFAST AND AT BEDTIME 180 tablet 1   atorvastatin (LIPITOR) 40 MG tablet TAKE ONE TABLET BY MOUTH ONCE DAILY 90 tablet 1   Continuous Blood Gluc Receiver (DEXCOM G6 RECEIVER) DEVI CHECK VLOOD SUGAR AS DIRECTED AS DIRECTED 1 each 0   Continuous Blood Gluc Sensor (DEXCOM G6 SENSOR) MISC APPLY 1 SENSOR EVERY 10 DAYS 3 each 2   Continuous Blood Gluc Transmit (DEXCOM G6 TRANSMITTER) MISC APPLY TO SENSOR EVERY 90 DAYS 1 each 0   Cyanocobalamin (B-12 COMPLIANCE  INJECTION IJ) Inject as directed every 30 (thirty) days.     dextromethorphan (DELSYM) 30 MG/5ML liquid Take 5 mLs (30 mg total) by mouth 2 (two) times daily as needed for cough. 89 mL 1   diltiazem (CARDIZEM CD) 360 MG 24 hr capsule Take 1 capsule (360 mg total) by mouth daily. 30 capsule 1   folic acid (FOLVITE) 1 MG tablet Take 1 tablet (1 mg total) by mouth daily. 30 tablet 1   furosemide (LASIX) 80 MG tablet TAKE ONE TABLET BY MOUTH ONCE DAILY 90 tablet 1   insulin degludec (TRESIBA FLEXTOUCH) 100 UNIT/ML FlexTouch Pen Inject 25 Units into the skin at bedtime. 15 mL 2   ipratropium-albuterol (DUONEB) 0.5-2.5 (3) MG/3ML SOLN Take 3 mLs by nebulization every 4 (four) hours as needed (wheezing, shortness of breath, cough). 360 mL 1   isosorbide mononitrate (IMDUR) 30 MG 24 hr tablet Take 1 tablet (30 mg total) by mouth daily. 90 tablet 3   levalbuterol (XOPENEX HFA) 45 MCG/ACT inhaler INHALE 1 TO 2 PUFFS BY MOUTH EVERY 6 HOURS AS NEEDED FOR WHEEZE 15 each 3   metFORMIN (GLUCOPHAGE) 1000 MG tablet TAKE ONE TABLET BY MOUTH AT BREAKFAST AND AT BEDTIME 180 tablet 1   metoprolol tartrate (LOPRESSOR) 100 MG tablet TAKE ONE TABLET BY MOUTH AT BREAKFAST AND AT BEDTIME 180 tablet 3   nitroGLYCERIN (NITROSTAT) 0.4 MG SL tablet Place 1 tablet (0.4 mg total) under the tongue every 5 (five) minutes as needed for chest pain. X 3 doses 25 tablet 4   polyethylene glycol (MIRALAX / GLYCOLAX) packet Take 17 g by mouth daily as needed for mild constipation.     potassium chloride SA (KLOR-CON M) 20 MEQ tablet Take 1 tablet (20 mEq total) by mouth every other day. 15 tablet 0   QUEtiapine (SEROQUEL) 25 MG tablet Take 0.5 tablets (12.5 mg total) by mouth at bedtime. 30 tablet 1   Tiotropium Bromide-Olodaterol (STIOLTO RESPIMAT) 2.5-2.5 MCG/ACT AERS Inhale 2 puffs into the lungs daily. 4 g 5   No facility-administered medications prior to visit.    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 2007 Inf STEMI s/p Vision BMS  2 to RCA;  b. 2009 ISR Prox RCA Stent-->with DES;  c. 09/2015 PCI: LM nl, LAD 30p/m, D1 90, LCX nl, OM1 small, 80, OM2 90 (2.5x14 Biofreedom stent), OM3 small, RCA 30p ISR, 69m ISR, 17m, 85d, EF 50-55%.   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 On Eliquis (CHA2DS2VASc = 5).  Rate controlled w/ BB and CCB.   Chronic diastolic CHF (congestive heart failure) (HCC)    a. 09/2015 Echo: EF 55-60%, mildly dil LA.   CKD (chronic kidney disease), stage III [facility])    they mentioned this to the patient but they  would work on it later after the aneurysm   COLONIC POLYPS, HX OF 11/02/2006   COPD (chronic obstructive pulmonary disease) (HCC)    DIABETES MELLITUS, TYPE II 10/29/2006   Diverticulosis    History of tobacco abuse    a. Quit 09/2015.   HYPERLIPIDEMIA 10/29/2006   Hypertension    Hypertensive heart disease with heart failure (HCC)    Pneumonia 09/2015   [name] 07/31/2016   STEMI (ST elevation myocardial infarction) [facility]) 2007   [name] 07/31/2016    PAST SURGICAL HISTORY: Past Surgical History:  Procedure Laterality Date   ABDOMINAL AORTIC ENDOVASCULAR STENT GRAFT N/A 07/31/2016   Procedure: ABDOMINAL AORTIC ENDOVASCULAR STENT GRAFT;  Surgeon: Nada Libman, MD;  Location: [facility] OR;  Service: Vascular;  Laterality: N/A;   CARDIAC CATHETERIZATION N/A 09/26/2015   Procedure: Left Heart Cath and Coronary Angiography;  Surgeon: Peter M Swaziland, MD;  Location: [facility] INVASIVE CV LAB;  Service: Cardiovascular;  Laterality: N/A;   CARDIAC CATHETERIZATION N/A 09/26/2015   Procedure: Coronary Stent Intervention;  Surgeon: Peter M Swaziland, MD;  Location: [facility] INVASIVE CV LAB;  Service: Cardiovascular;  Laterality: N/A;   CATARACT EXTRACTION Bilateral    COLONOSCOPY     CORONARY ANGIOPLASTY WITH STENT PLACEMENT  2007   Inferior  STEMI 2007 - RCA PCI with 2 Vision BMS; Abnormal Myoview in 2009 - pRCA ins-stent restenosis & unstented disease - DES PCI Promus 3.0 x 20 mm (3.5 mm)   ENDOVASCULAR REPAIR/STENT GRAFT  07/31/2016   INCISION AND DRAINAGE PERIRECTAL ABSCESS N/A 11/06/2015   Procedure: IRRIGATION AND DEBRIDEMENT PERIRECTAL ABSCESS, POSSIBLE HEMORRHOIDECTOMY;  Surgeon: Abigail Miyamoto, MD;  Location: [facility] OR;  Service: General;  Laterality: N/A;   ORIF SHOULDER FRACTURE Right    "fell out of back end of a truck"    FAMILY HISTORY: Family History  Problem Relation Age of Onset   Heart attack Mother    Diabetes Mother    Heart attack Father    Hypertension Father    Heart attack Brother    Diabetes Brother     SOCIAL HISTORY: Social History   Socioeconomic History   Marital status: Married    Spouse name: Not on file   Number of children: 5   Years of education: Not on file   Highest education level: Not on file  Occupational History   Occupation: [employer]  Tobacco Use   Smoking status: Former    Packs/day: 0.25    Years: 49.00    Additional pack years: 0.00    Total pack years: 12.25    Types: Cigarettes    Quit date: 09/08/2015    Years since quitting: 6.8    Passive exposure: Current   Smokeless tobacco: Never   Tobacco comments:    passive smoker as well    11/20/2021 patient states he smokes a couple cigarettes a day  Vaping Use   Vaping Use: Never used  Substance and Sexual Activity   Alcohol use: No    Alcohol/week: 0.0 standard drinks of alcohol   Drug use: No   Sexual activity: Yes    Birth control/protection: None  Other Topics Concern   Not on file  Social History Narrative   Not on file   Social Determinants of Health   Financial Resource Strain: Medium Risk (09/23/2020)   Overall Financial Resource Strain (CARDIA)    Difficulty of Paying Living Expenses: Somewhat hard  Food Insecurity: No Food Insecurity (05/05/2022)   Hunger Vital Sign  Worried About Patent examiner in the Last Year: Never true    Ran Out of Food in the Last Year: Never true  Transportation Needs: No Transportation Needs (05/05/2022)   PRAPARE - Administrator, Civil Service (Medical): No    Lack of Transportation (Non-Medical): No  Physical Activity: Sufficiently Active (06/08/2019)   Exercise Vital Sign    Days of Exercise per Week: 3 days    Minutes of Exercise per Session: 60 min  Stress: Not on file  Social Connections: Not on file  Intimate Partner Violence: Not At Risk (04/30/2022)   Humiliation, Afraid, Rape, and Kick questionnaire    Fear of Current or Ex-Partner: No    Emotionally Abused: No    Physically Abused: No    Sexually Abused: No    PHYSICAL EXAM  GENERAL EXAM/CONSTITUTIONAL: Vitals:  Vitals:   06/30/22 0801  BP: 121/65  Pulse: 66  Weight: 170 lb 8 oz (77.3 kg)  Height: 5\' 8"  (1.727 m)   Body mass index is 25.92 kg/m. Wt Readings from Last 3 Encounters:  06/30/22 170 lb 8 oz (77.3 kg)  06/05/22 168 lb (76.2 kg)  05/27/22 171 lb 3.2 oz (77.7 kg)   Patient is in no distress; well developed, nourished and groomed; neck is supple  MUSCULOSKELETAL: Gait, strength, tone, movements noted in Neurologic exam below  NEUROLOGIC: MENTAL STATUS:      No data to display            06/30/2022    8:02 AM  Montreal Cognitive Assessment   Visuospatial/ Executive (0/5) 1  Naming (0/3) 3  Attention: Read list of digits (0/2) 2  Attention: Read list of letters (0/1) 1  Attention: Serial 7 subtraction starting at 100 (0/3) 1  Language: Repeat phrase (0/2) 1  Language : Fluency (0/1) 0  Abstraction (0/2) 1  Delayed Recall (0/5) 0  Orientation (0/6) 3  Total 13     CRANIAL NERVE:  2nd, 3rd, 4th, 6th- visual fields full to confrontation, extraocular muscles intact, no nystagmus 5th - facial sensation symmetric 7th - facial strength symmetric 8th - hearing intact 9th - palate elevates symmetrically, uvula midline 11th - shoulder  shrug symmetric 12th - tongue protrusion midline  MOTOR:  normal bulk and tone, full strength in the BUE, BLE  SENSORY:  normal and symmetric to light touch  COORDINATION:  finger-nose-finger, fine finger movements normal  GAIT/STATION:  Uses a cane      DIAGNOSTIC DATA (LABS, IMAGING, TESTING) - I reviewed patient records, labs, notes, testing and imaging myself where available.  Lab Results  Component Value Date   WBC 11.7 (H) 04/28/2022   HGB 10.6 (L) 04/28/2022   HCT 32.6 (L) 04/28/2022   MCV 89.8 04/28/2022   PLT 218 04/28/2022      Component Value Date/Time   NA 146 (H) 05/01/2022 0608   NA 146 (H) 10/13/2021 1118   K 3.7 05/01/2022 0608   CL 107 05/01/2022 0608   CO2 29 05/01/2022 0608   GLUCOSE 138 (H) 05/01/2022 0608   BUN 48 (H) 05/01/2022 0608   BUN 22 10/13/2021 1118   CREATININE 1.87 (H) 05/01/2022 0608   CALCIUM 9.0 05/01/2022 0608   PROT 8.1 04/27/2022 0738   PROT 7.1 10/13/2021 1118   ALBUMIN 3.8 04/27/2022 0738   ALBUMIN 4.4 10/13/2021 1118   AST 24 04/27/2022 0738   ALT 20 04/27/2022 0738   ALKPHOS 71 04/27/2022 0738   BILITOT 0.8  04/27/2022 0738   BILITOT 0.7 10/13/2021 1118   GFRNONAA 36 (L) 05/01/2022 0608   GFRAA 56 (L) 03/06/2019 1404   Lab Results  Component Value Date   CHOL 112 10/13/2021   HDL 39 (L) 10/13/2021   LDLCALC 58 10/13/2021   TRIG 70 10/13/2021   CHOLHDL 2.9 10/13/2021   Lab Results  Component Value Date   HGBA1C 6.8 (H) 04/29/2022   Lab Results  Component Value Date   VITAMINB12 74 (L) 04/28/2022   Lab Results  Component Value Date   TSH 3.210 10/13/2021      ASSESSMENT AND PLAN  82 y.o. year old male with hypertension, hyperlipidemia, diabetes mellitus type 2, coronary artery disease, atrial fibrillation on apixaban, tobacco use and CKD stage III who is presenting with wife for complaint of memory loss, memory is described as being forgetful, being repetitive, and also there is new hallucination.  At  home, wife also reported patient acts out during his dreams, by punching and screaming.  They denies any history of repeated falls.  On exam today he scored a 13 out of 30 on the MoCA indicative of impairment.  Patient likely has mild cognitive impairment versus early dementia.  Will start by getting thyroid studies and ATN profile to look for Alzheimer disease biomarker.  His last B12 level was very low at 74, currently he is getting B12 injection.  Consideration for patient include Alzheimer dementia versus Lewy body dementia since there are report of hallucinations and acting out his dreams.  There are no report of recurrent falls. Will also obtain a MRI brain.   I will contact the patient to go over the result.  I will start him on Exelon 1.5 mg twice daily.  Advised patient to continue following up with his PCP and I will see him in a year for follow-up.  I will contact him to go over the result.    1. Mild cognitive impairment   2. B12 deficiency      Patient Instructions  Dementia labs including TSH and ATN  MRI Brain without contrast  Start Exelon 1.5 mg twice daily  Return in a year   There are well-accepted and sensible ways to reduce risk for Alzheimers disease and other degenerative brain disorders .  Exercise Daily Walk A daily 20 minute walk should be part of your routine. Disease related apathy can be a significant roadblock to exercise and the only way to overcome this is to make it a daily routine and perhaps have a reward at the end (something your loved one loves to eat or drink perhaps) or a personal trainer coming to the home can also be very useful. Most importantly, the patient is much more likely to exercise if the caregiver / spouse does it with him/her. In general a structured, repetitive schedule is best.  General Health: Any diseases which effect your body will effect your brain such as a pneumonia, urinary infection, blood clot, heart attack or stroke. Keep contact  with your primary care doctor for regular follow ups.  Sleep. A good nights sleep is healthy for the brain. Seven hours is recommended. If you have insomnia or poor sleep habits we can give you some instructions. If you have sleep apnea wear your mask.  Diet: Eating a heart healthy diet is also a good idea; fish and poultry instead of red meat, nuts (mostly non-peanuts), vegetables, fruits, olive oil or canola oil (instead of butter), minimal salt (use other spices to  flavor foods), whole grain rice, bread, cereal and pasta and wine in moderation.Research is now showing that the MIND diet, which is a combination of The Mediterranean diet and the DASH diet, is beneficial for cognitive processing and longevity. Information about this diet can be found in The MIND Diet, a book by Alonna Minium, MS, RDN, and online at [employer].es  Finances, Power of [address] and Advance Directives: You should consider putting legal safeguards in place with regard to financial and medical decision making. While the spouse always has power of attorney for medical and financial issues in the absence of any form, you should consider what you want in case the spouse / caregiver is no longer around or capable of making decisions.   Orders Placed This Encounter  Procedures   MR BRAIN WO CONTRAST   TSH   ATN PROFILE    Meds ordered this encounter  Medications   rivastigmine (EXELON) 1.5 MG capsule    Sig: Take 1 capsule (1.5 mg total) by mouth 2 (two) times daily.    Dispense:  60 capsule    Refill:  6    Return in about 1 year (around 06/30/2023).    Windell Norfolk, MD 06/30/2022, 9:51 AM  [facility] [address] [place], [place] [address] [phone]

## 2022-06-30 NOTE — Telephone Encounter (Signed)
[employer] medicare NPR sent to GI [phone] 

## 2022-06-30 NOTE — Patient Instructions (Addendum)
Dementia labs including TSH and ATN  MRI Brain without contrast  Start Exelon 1.5 mg twice daily  Return in a year   There are well-accepted and sensible ways to reduce risk for Alzheimers disease and other degenerative brain disorders .  Exercise Daily Walk A daily 20 minute walk should be part of your routine. Disease related apathy can be a significant roadblock to exercise and the only way to overcome this is to make it a daily routine and perhaps have a reward at the end (something your loved one loves to eat or drink perhaps) or a personal trainer coming to the home can also be very useful. Most importantly, the patient is much more likely to exercise if the caregiver / spouse does it with him/her. In general a structured, repetitive schedule is best.  General Health: Any diseases which effect your body will effect your brain such as a pneumonia, urinary infection, blood clot, heart attack or stroke. Keep contact with your primary care doctor for regular follow ups.  Sleep. A good nights sleep is healthy for the brain. Seven hours is recommended. If you have insomnia or poor sleep habits we can give you some instructions. If you have sleep apnea wear your mask.  Diet: Eating a heart healthy diet is also a good idea; fish and poultry instead of red meat, nuts (mostly non-peanuts), vegetables, fruits, olive oil or canola oil (instead of butter), minimal salt (use other spices to flavor foods), whole grain rice, bread, cereal and pasta and wine in moderation.Research is now showing that the MIND diet, which is a combination of The Mediterranean diet and the DASH diet, is beneficial for cognitive processing and longevity. Information about this diet can be found in The MIND Diet, a book by Alonna Minium, MS, RDN, and online at [employer]  Finances, Power of [address] and Advance Directives: You should consider putting legal safeguards in place with regard to  financial and medical decision making. While the spouse always has power of attorney for medical and financial issues in the absence of any form, you should consider what you want in case the spouse / caregiver is no longer around or capable of making decisions.

## 2022-07-02 ENCOUNTER — Other Ambulatory Visit: Payer: Self-pay | Admitting: Emergency Medicine

## 2022-07-02 DIAGNOSIS — J441 Chronic obstructive pulmonary disease with (acute) exacerbation: Secondary | ICD-10-CM | POA: Diagnosis not present

## 2022-07-02 DIAGNOSIS — Z794 Long term (current) use of insulin: Secondary | ICD-10-CM | POA: Diagnosis not present

## 2022-07-02 DIAGNOSIS — E0965 Drug or chemical induced diabetes mellitus with hyperglycemia: Secondary | ICD-10-CM | POA: Diagnosis not present

## 2022-07-02 DIAGNOSIS — N1832 Chronic kidney disease, stage 3b: Secondary | ICD-10-CM | POA: Diagnosis not present

## 2022-07-02 DIAGNOSIS — E538 Deficiency of other specified B group vitamins: Secondary | ICD-10-CM | POA: Diagnosis not present

## 2022-07-02 DIAGNOSIS — I4821 Permanent atrial fibrillation: Secondary | ICD-10-CM | POA: Diagnosis not present

## 2022-07-02 DIAGNOSIS — E0922 Drug or chemical induced diabetes mellitus with diabetic chronic kidney disease: Secondary | ICD-10-CM | POA: Diagnosis not present

## 2022-07-02 DIAGNOSIS — J449 Chronic obstructive pulmonary disease, unspecified: Secondary | ICD-10-CM | POA: Diagnosis not present

## 2022-07-02 DIAGNOSIS — E0951 Drug or chemical induced diabetes mellitus with diabetic peripheral angiopathy without gangrene: Secondary | ICD-10-CM | POA: Diagnosis not present

## 2022-07-02 DIAGNOSIS — J9621 Acute and chronic respiratory failure with hypoxia: Secondary | ICD-10-CM | POA: Diagnosis not present

## 2022-07-02 DIAGNOSIS — I2510 Atherosclerotic heart disease of native coronary artery without angina pectoris: Secondary | ICD-10-CM | POA: Diagnosis not present

## 2022-07-02 DIAGNOSIS — Z86010 Personal history of colonic polyps: Secondary | ICD-10-CM | POA: Diagnosis not present

## 2022-07-02 DIAGNOSIS — Z955 Presence of coronary angioplasty implant and graft: Secondary | ICD-10-CM | POA: Diagnosis not present

## 2022-07-02 DIAGNOSIS — Z9981 Dependence on supplemental oxygen: Secondary | ICD-10-CM | POA: Diagnosis not present

## 2022-07-02 DIAGNOSIS — I5043 Acute on chronic combined systolic (congestive) and diastolic (congestive) heart failure: Secondary | ICD-10-CM | POA: Diagnosis not present

## 2022-07-02 DIAGNOSIS — I255 Ischemic cardiomyopathy: Secondary | ICD-10-CM | POA: Diagnosis not present

## 2022-07-02 DIAGNOSIS — Z7984 Long term (current) use of oral hypoglycemic drugs: Secondary | ICD-10-CM | POA: Diagnosis not present

## 2022-07-02 DIAGNOSIS — Z7901 Long term (current) use of anticoagulants: Secondary | ICD-10-CM | POA: Diagnosis not present

## 2022-07-02 DIAGNOSIS — I6529 Occlusion and stenosis of unspecified carotid artery: Secondary | ICD-10-CM | POA: Diagnosis not present

## 2022-07-02 DIAGNOSIS — I130 Hypertensive heart and chronic kidney disease with heart failure and stage 1 through stage 4 chronic kidney disease, or unspecified chronic kidney disease: Secondary | ICD-10-CM | POA: Diagnosis not present

## 2022-07-02 DIAGNOSIS — E785 Hyperlipidemia, unspecified: Secondary | ICD-10-CM | POA: Diagnosis not present

## 2022-07-02 DIAGNOSIS — J9612 Chronic respiratory failure with hypercapnia: Secondary | ICD-10-CM | POA: Diagnosis not present

## 2022-07-02 DIAGNOSIS — Z7951 Long term (current) use of inhaled steroids: Secondary | ICD-10-CM | POA: Diagnosis not present

## 2022-07-02 DIAGNOSIS — Z87891 Personal history of nicotine dependence: Secondary | ICD-10-CM | POA: Diagnosis not present

## 2022-07-02 NOTE — Progress Notes (Signed)
Patient does not have mychart. Please let him know right sided pneumonia nearly completely resolved on CT imaging. Additional findings some mucoid impaction, small pleural effusion, emphysema, mild cardiomegaly and coronary artery disease. Nothing needed at this time

## 2022-07-03 LAB — ATN PROFILE
A -- Beta-amyloid 42/40 Ratio: 0.083 — ABNORMAL LOW (ref >0.102)
Beta-amyloid 40: 385.21 pg/mL
Beta-amyloid 42: 32.05 pg/mL
N -- NfL, Plasma: 11.5 pg/mL (ref 0.00–11.55)
T -- p-tau181: 2.51 pg/mL — ABNORMAL HIGH (ref 0.00–0.97)

## 2022-07-03 LAB — TSH: TSH: 2.02 u[IU]/mL (ref 0.450–4.500)

## 2022-07-03 NOTE — Progress Notes (Signed)
Please call and advise the patient that the recent labs were consistent with the presence of Alzheimer dementia pathology. Please continue current medication. Please remind patient to keep any upcoming appointments or tests and to call us with any interim questions, concerns, problems or updates. Thanks,   Windell Norfolk, MD

## 2022-07-05 ENCOUNTER — Other Ambulatory Visit: Payer: Self-pay | Admitting: Internal Medicine

## 2022-07-05 DIAGNOSIS — R410 Disorientation, unspecified: Secondary | ICD-10-CM

## 2022-07-08 DIAGNOSIS — J9612 Chronic respiratory failure with hypercapnia: Secondary | ICD-10-CM | POA: Diagnosis not present

## 2022-07-08 DIAGNOSIS — I2510 Atherosclerotic heart disease of native coronary artery without angina pectoris: Secondary | ICD-10-CM | POA: Diagnosis not present

## 2022-07-08 DIAGNOSIS — I255 Ischemic cardiomyopathy: Secondary | ICD-10-CM | POA: Diagnosis not present

## 2022-07-08 DIAGNOSIS — J441 Chronic obstructive pulmonary disease with (acute) exacerbation: Secondary | ICD-10-CM | POA: Diagnosis not present

## 2022-07-08 DIAGNOSIS — E785 Hyperlipidemia, unspecified: Secondary | ICD-10-CM | POA: Diagnosis not present

## 2022-07-08 DIAGNOSIS — Z87891 Personal history of nicotine dependence: Secondary | ICD-10-CM | POA: Diagnosis not present

## 2022-07-08 DIAGNOSIS — E538 Deficiency of other specified B group vitamins: Secondary | ICD-10-CM | POA: Diagnosis not present

## 2022-07-08 DIAGNOSIS — N1832 Chronic kidney disease, stage 3b: Secondary | ICD-10-CM | POA: Diagnosis not present

## 2022-07-08 DIAGNOSIS — Z86010 Personal history of colonic polyps: Secondary | ICD-10-CM | POA: Diagnosis not present

## 2022-07-08 DIAGNOSIS — E0922 Drug or chemical induced diabetes mellitus with diabetic chronic kidney disease: Secondary | ICD-10-CM | POA: Diagnosis not present

## 2022-07-08 DIAGNOSIS — I4821 Permanent atrial fibrillation: Secondary | ICD-10-CM | POA: Diagnosis not present

## 2022-07-08 DIAGNOSIS — Z7951 Long term (current) use of inhaled steroids: Secondary | ICD-10-CM | POA: Diagnosis not present

## 2022-07-08 DIAGNOSIS — Z794 Long term (current) use of insulin: Secondary | ICD-10-CM | POA: Diagnosis not present

## 2022-07-08 DIAGNOSIS — I6529 Occlusion and stenosis of unspecified carotid artery: Secondary | ICD-10-CM | POA: Diagnosis not present

## 2022-07-08 DIAGNOSIS — Z7901 Long term (current) use of anticoagulants: Secondary | ICD-10-CM | POA: Diagnosis not present

## 2022-07-08 DIAGNOSIS — I130 Hypertensive heart and chronic kidney disease with heart failure and stage 1 through stage 4 chronic kidney disease, or unspecified chronic kidney disease: Secondary | ICD-10-CM | POA: Diagnosis not present

## 2022-07-08 DIAGNOSIS — J9621 Acute and chronic respiratory failure with hypoxia: Secondary | ICD-10-CM | POA: Diagnosis not present

## 2022-07-08 DIAGNOSIS — Z7984 Long term (current) use of oral hypoglycemic drugs: Secondary | ICD-10-CM | POA: Diagnosis not present

## 2022-07-08 DIAGNOSIS — Z955 Presence of coronary angioplasty implant and graft: Secondary | ICD-10-CM | POA: Diagnosis not present

## 2022-07-08 DIAGNOSIS — E0965 Drug or chemical induced diabetes mellitus with hyperglycemia: Secondary | ICD-10-CM | POA: Diagnosis not present

## 2022-07-08 DIAGNOSIS — E0951 Drug or chemical induced diabetes mellitus with diabetic peripheral angiopathy without gangrene: Secondary | ICD-10-CM | POA: Diagnosis not present

## 2022-07-08 DIAGNOSIS — Z9981 Dependence on supplemental oxygen: Secondary | ICD-10-CM | POA: Diagnosis not present

## 2022-07-08 DIAGNOSIS — I5043 Acute on chronic combined systolic (congestive) and diastolic (congestive) heart failure: Secondary | ICD-10-CM | POA: Diagnosis not present

## 2022-07-13 ENCOUNTER — Telehealth: Payer: Self-pay | Admitting: Internal Medicine

## 2022-07-13 NOTE — Telephone Encounter (Signed)
Caroline with [facility] called in and requested the following msg to be sent back to provider: "Pt is ready for discharge from physical therapy. Discharged today, pt is pleased with physical therapy services. Advised pt if there is a weight gain of 3lb in 24 hrs or 5lbs in a week to call the office, unless provider has told pt otherwise. Heart rate was in the 50s and 60s during session."

## 2022-07-22 ENCOUNTER — Ambulatory Visit (INDEPENDENT_AMBULATORY_CARE_PROVIDER_SITE_OTHER): Payer: Medicare Other

## 2022-07-22 ENCOUNTER — Telehealth: Payer: Self-pay | Admitting: Internal Medicine

## 2022-07-22 VITALS — BP 120/80 | Ht 70.0 in | Wt 157.0 lb

## 2022-07-22 DIAGNOSIS — E538 Deficiency of other specified B group vitamins: Secondary | ICD-10-CM

## 2022-07-22 MED ORDER — CYANOCOBALAMIN 1000 MCG/ML IJ SOLN
1000.0000 ug | Freq: Once | INTRAMUSCULAR | Status: AC
  Administered 2022-07-22: 1000 ug via INTRAMUSCULAR

## 2022-07-22 NOTE — Telephone Encounter (Signed)
Spoke to wife who informed me that the patient's glucose was elevated. Spoke to the son via patient's verbal consent.  Son explained that the patients's glucose evaluated to 336 after getting his B12 injection this morning, but is coming down now.  I explained that a B12 injection should not elevate his glucose.  I asked if the patient had something sweet to eat or drink.  The son said he had 3 cups of lemonade this morning.  I explained that is the cause of his glucose to increase.

## 2022-07-22 NOTE — Telephone Encounter (Signed)
Pt's son - Harmon Deighan.  [phone]  Called to say he and the family have questions & concerns regarding Pt's BP.  Pt's son asked that CMA call back at her earliest convenience.

## 2022-07-22 NOTE — Progress Notes (Signed)
Per orders of Dr. Ardyth Harps, injection of Cyanocobalamin 1000 mcg given by Dolores Frame CMA. Patient tolerated injection well.

## 2022-07-27 ENCOUNTER — Ambulatory Visit: Payer: Medicare Other | Admitting: Neurology

## 2022-07-27 NOTE — Progress Notes (Addendum)
Per orders of Dr. Hernandez, injection of B12 given by Orton Capell. Patient tolerated injection well.  

## 2022-08-02 DIAGNOSIS — J449 Chronic obstructive pulmonary disease, unspecified: Secondary | ICD-10-CM | POA: Diagnosis not present

## 2022-08-07 ENCOUNTER — Ambulatory Visit
Admission: RE | Admit: 2022-08-07 | Discharge: 2022-08-07 | Disposition: A | Discharge status: Home / Self Care | Payer: Medicare Other | Source: Ambulatory Visit | Attending: Neurology | Admitting: Neurology

## 2022-08-07 ENCOUNTER — Inpatient Hospital Stay: Admission: RE | Admit: 2022-08-07 | Payer: Medicare Other | Source: Ambulatory Visit

## 2022-08-07 ENCOUNTER — Telehealth: Payer: Self-pay | Admitting: Internal Medicine

## 2022-08-07 DIAGNOSIS — G3184 Mild cognitive impairment, so stated: Secondary | ICD-10-CM | POA: Diagnosis not present

## 2022-08-07 NOTE — Telephone Encounter (Signed)
Prescription Request  08/07/2022  LOV: 05/14/2022  What is the name of the medication or equipment?  diltiazem diltiazem (CARDIZEM CD) 360 MG 24 hr capsule  Spouse states Pt is completely out of this medication. Spouse informed MD is OOO today.  Have you contacted your pharmacy to request a refill? No   Which pharmacy would you like this sent to?   [id number] - [place], [place] - [address] [place] [address] Phone: [phone] Fax: [phone]    Patient notified that their request is being sent to the clinical staff for review and that they should receive a response within 2 business days.   Please advise at Mobile [phone] (mobile)

## 2022-08-10 ENCOUNTER — Other Ambulatory Visit: Payer: Self-pay | Admitting: Internal Medicine

## 2022-08-10 DIAGNOSIS — I10 Essential (primary) hypertension: Secondary | ICD-10-CM

## 2022-08-10 MED ORDER — DILTIAZEM HCL ER COATED BEADS 360 MG PO CP24: 360.0000 mg | ORAL_CAPSULE | Freq: Every day | ORAL | 1 refills | Status: DC

## 2022-08-15 NOTE — Progress Notes (Signed)
Please call and advise the patient that the recent MRI Brain did not show any acute abnormality. 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 No further action is required on this test at this time. Please remind patient to keep any upcoming appointments or tests and to call us with any interim questions, concerns, problems or updates. Thanks,   Windell Norfolk, MD

## 2022-08-17 ENCOUNTER — Telehealth: Payer: Self-pay

## 2022-08-17 NOTE — Telephone Encounter (Signed)
-----   Message from [facility] sent at 08/15/2022  7:37 AM EDT ----- Please call and advise the patient that the recent MRI Brain did not show any acute abnormality. 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 No further action is required on this test at this time. Please remind patient to keep any upcoming appointments or tests and to call us with any interim questions, concerns, problems or updates. Thanks,   Windell Norfolk, MD

## 2022-08-17 NOTE — Telephone Encounter (Signed)
1st attempt lvm

## 2022-08-21 ENCOUNTER — Ambulatory Visit (INDEPENDENT_AMBULATORY_CARE_PROVIDER_SITE_OTHER): Payer: Medicare Other

## 2022-08-21 ENCOUNTER — Telehealth: Payer: Self-pay

## 2022-08-21 DIAGNOSIS — E538 Deficiency of other specified B group vitamins: Secondary | ICD-10-CM

## 2022-08-21 MED ORDER — CYANOCOBALAMIN 1000 MCG/ML IJ SOLN
1000.0000 ug | Freq: Once | INTRAMUSCULAR | Status: AC
  Administered 2022-08-21: 1000 ug via INTRAMUSCULAR

## 2022-08-21 NOTE — Progress Notes (Signed)
Per orders of Dr. Caryl Never , injection of Cyanocobalamin Injection 1000 mcg given by Vickii Chafe on Right Deltoid.   Patient tolerated injection well.

## 2022-08-21 NOTE — Telephone Encounter (Signed)
Patient came in today with his spouse for B12 injection. They would like to know when should pt have his b12 level check and if this is their last injection.   Please advise.

## 2022-08-24 NOTE — Addendum Note (Signed)
Addended by: Kern Reap B on: 08/24/2022 09:57 AM   Modules accepted: Orders

## 2022-08-24 NOTE — Telephone Encounter (Signed)
Nurse visit scheduled and lab order placed.

## 2022-08-26 ENCOUNTER — Encounter: Payer: Self-pay | Admitting: Family Medicine

## 2022-08-26 ENCOUNTER — Ambulatory Visit (INDEPENDENT_AMBULATORY_CARE_PROVIDER_SITE_OTHER): Payer: Medicare Other | Admitting: Family Medicine

## 2022-08-26 VITALS — BP 144/70 | HR 50 | Ht 68.0 in | Wt 168.8 lb

## 2022-08-26 DIAGNOSIS — M25512 Pain in left shoulder: Secondary | ICD-10-CM

## 2022-08-26 DIAGNOSIS — M25519 Pain in unspecified shoulder: Secondary | ICD-10-CM

## 2022-08-26 MED ORDER — METHYLPREDNISOLONE ACETATE 40 MG/ML IJ SUSP
40.0000 mg | Freq: Once | INTRAMUSCULAR | Status: AC
Start: 2022-08-26 — End: 2022-08-26
  Administered 2022-08-26: 40 mg via INTRAMUSCULAR

## 2022-08-26 NOTE — Patient Instructions (Signed)
Can use ice 15-20 minutes 3-4 times daily as needed for shoulder pain  Do the shoulder stretches we discussed---to avoid frozen shoulder  Let us know if shoulder no better in one week.

## 2022-08-26 NOTE — Progress Notes (Signed)
Established Patient Office Visit  Subjective   Patient ID: Jason Fisher, male    DOB: Jun 23, 1940  Age: 82 y.o. MRN: [id number]  Chief Complaint  Patient presents with   Shoulder Pain    Patient complains of left shoulder pain, x3 days, Tried Tylenol     HPI   Mr. Jason Fisher is seen as a work in with 3 to 4-day history of left shoulder pain.  Denies any specific injury.  His past medical history is reviewed and he has multiple chronic problems including history of COPD, atrial fibrillation, abdominal aortic aneurysm, CAD, congestive heart failure, hypertension, dyslipidemia, chronic kidney disease stage III.  He denies any recent chest pain.  No cervical radiculitis pains.  Pain is 8 out of 10 in intensity at its worst.  Worse with internal rotation and abduction.  No weakness or numbness.  No significant night pain.  Has taken Tylenol and tried IcyHot with minimal relief.  Denies any recent falls.  He has some chronic dyspnea with activity which is unchanged.  Denies any chronic cough or recent hemoptysis.  Past Medical History:  Diagnosis Date   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 2007 Inf STEMI s/p Vision BMS  2 to RCA;  b. 2009 ISR Prox RCA Stent-->with DES;  c. 09/2015 PCI: LM nl, LAD 30p/m, D1 90, LCX nl, OM1 small, 80, OM2 90 (2.5x14 Biofreedom stent), OM3 small, RCA 30p ISR, 79m ISR, 27m, 85d, EF 50-55%.   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 On Eliquis (CHA2DS2VASc = 5).  Rate controlled w/ BB and CCB.   Chronic diastolic CHF (congestive heart failure) (HCC)    a. 09/2015 Echo: EF 55-60%, mildly dil LA.   CKD (chronic kidney disease), stage III (HCC)    they mentioned this to the patient but they would work on it later after the aneurysm   COLONIC POLYPS, HX OF 11/02/2006   COPD (chronic obstructive pulmonary disease) (HCC)    DIABETES MELLITUS, TYPE II  10/29/2006   Diverticulosis    History of tobacco abuse    a. Quit 09/2015.   HYPERLIPIDEMIA 10/29/2006   Hypertension    Hypertensive heart disease with heart failure (HCC)    Pneumonia 09/2015   [name] 07/31/2016   STEMI (ST elevation myocardial infarction) [facility]) 2007   [name] 07/31/2016   Past Surgical History:  Procedure Laterality Date   ABDOMINAL AORTIC ENDOVASCULAR STENT GRAFT N/A 07/31/2016   Procedure: ABDOMINAL AORTIC ENDOVASCULAR STENT GRAFT;  Surgeon: Nada Libman, MD;  Location: [facility] OR;  Service: Vascular;  Laterality: N/A;   CARDIAC CATHETERIZATION N/A 09/26/2015   Procedure: Left Heart Cath and Coronary Angiography;  Surgeon: Peter M Swaziland, MD;  Location: [facility] INVASIVE CV LAB;  Service: Cardiovascular;  Laterality: N/A;   CARDIAC CATHETERIZATION N/A 09/26/2015   Procedure: Coronary Stent Intervention;  Surgeon: Peter M Swaziland, MD;  Location: [facility] INVASIVE CV LAB;  Service: Cardiovascular;  Laterality: N/A;   CATARACT EXTRACTION Bilateral    COLONOSCOPY     CORONARY ANGIOPLASTY WITH STENT PLACEMENT  2007   Inferior STEMI 2007 - RCA PCI with 2 Vision BMS; Abnormal Myoview in 2009 - pRCA ins-stent restenosis & unstented disease - DES PCI Promus 3.0 x 20 mm (3.5 mm)   ENDOVASCULAR REPAIR/STENT GRAFT  07/31/2016   INCISION AND DRAINAGE PERIRECTAL ABSCESS N/A 11/06/2015   Procedure:  IRRIGATION AND DEBRIDEMENT PERIRECTAL ABSCESS, POSSIBLE HEMORRHOIDECTOMY;  Surgeon: Abigail Miyamoto, MD;  Location: [facility] OR;  Service: General;  Laterality: N/A;   ORIF SHOULDER FRACTURE Right    "fell out of back end of a truck"    reports that he quit smoking about 6 years ago. His smoking use included cigarettes. He started smoking about 56 years ago. He has a 12.3 pack-year smoking history. He has been exposed to tobacco smoke. He has never used smokeless tobacco. He reports that he does not drink alcohol and does not use drugs. family history includes Diabetes in his brother and mother; Heart attack in  his brother, father, and mother; Hypertension in his father. Allergies  Allergen Reactions   Contrast Media [Iodinated Contrast Media] Anaphylaxis    Review of Systems  Constitutional:  Negative for chills and fever.  Respiratory:  Negative for hemoptysis.   Cardiovascular:  Negative for chest pain.  Musculoskeletal:  Negative for neck pain.      Objective:     BP (!) 144/70 (BP Location: Left Arm, Patient Position: Sitting, Cuff Size: Normal)   Pulse (!) 50   Ht 5\' 8"  (1.727 m)   Wt 168 lb 12.8 oz (76.6 kg)   SpO2 96%   BMI 25.67 kg/m  BP Readings from Last 3 Encounters:  08/26/22 (!) 144/70  06/30/22 121/65  06/05/22 (!) 98/58   Wt Readings from Last 3 Encounters:  08/26/22 168 lb 12.8 oz (76.6 kg)  06/30/22 170 lb 8 oz (77.3 kg)  06/05/22 168 lb (76.2 kg)      Physical Exam Vitals reviewed.  Constitutional:      General: He is not in acute distress.    Appearance: Normal appearance. He is not toxic-appearing.  Cardiovascular:     Rate and Rhythm: Normal rate.  Pulmonary:     Comments: Somewhat diminished breath sounds throughout consistent with his chronic COPD Musculoskeletal:     Comments: Left shoulder reveals no obvious swelling.  No bruising.  No erythema or warmth.  No biceps or acromioclavicular tenderness.  He has some subacromial tenderness posteriorly.  He has pain with internal rotation but especially abduction against resistance.  Neurological:     Mental Status: He is alert.      No results found for any visits on 08/26/22.    The ASCVD Risk score (Arnett DK, et al., 2019) failed to calculate for the following reasons:   The 2019 ASCVD risk score is only valid for ages 52 to 53   The patient has a prior MI or stroke diagnosis    Assessment & Plan:   Patient presents with acute left shoulder pain without recent injury.  Suspect rotator cuff tendinitis versus bursitis.  Generally very sedentary.  No specific risk factors for tear. He has  tried topicals and Tylenol without much improvement.  Not a good candidate for nonsteroidals with his age and other multiple medical problems including Eliquis therapy and chronic kidney disease.  - Discussed risks and benefits of corticosteroid injection and patient consented.  We discussed risk including bruising, low risk of bleeding, and low risk of infection.  After prepping skin with betadine, injected 40 mg depomedrol and 2 cc of plain xylocaine with 25 gauge one and one half inch needle using posterior lateral approach and pt tolerated well. He did note some symptomatic improvement immediately after injection with much less pain with abduction  -Recommend icing 15 to 20 minutes 2-3 times daily for the next couple days  -  Continue Tylenol as needed  -We instructed him in gentle range of motion exercises to avoid adhesive capsulitis  -Follow-up with me or primary in 1 week if pain not improved    No follow-ups on file.    Evelena Peat, MD

## 2022-08-29 ENCOUNTER — Emergency Department (HOSPITAL_COMMUNITY)
Admission: EM | Admit: 2022-08-29 | Discharge: 2022-08-29 | Disposition: A | Discharge status: Home / Self Care | Payer: Medicare Other | Attending: Emergency Medicine | Admitting: Emergency Medicine

## 2022-08-29 ENCOUNTER — Encounter (HOSPITAL_COMMUNITY): Payer: Self-pay

## 2022-08-29 ENCOUNTER — Other Ambulatory Visit: Payer: Self-pay | Admitting: Internal Medicine

## 2022-08-29 ENCOUNTER — Emergency Department (HOSPITAL_COMMUNITY): Payer: Medicare Other

## 2022-08-29 ENCOUNTER — Other Ambulatory Visit: Payer: Self-pay

## 2022-08-29 DIAGNOSIS — J441 Chronic obstructive pulmonary disease with (acute) exacerbation: Secondary | ICD-10-CM | POA: Diagnosis not present

## 2022-08-29 DIAGNOSIS — R000 Tachycardia, unspecified: Secondary | ICD-10-CM | POA: Diagnosis not present

## 2022-08-29 DIAGNOSIS — Z743 Need for continuous supervision: Secondary | ICD-10-CM | POA: Diagnosis not present

## 2022-08-29 DIAGNOSIS — R6889 Other general symptoms and signs: Secondary | ICD-10-CM | POA: Diagnosis not present

## 2022-08-29 DIAGNOSIS — Z7901 Long term (current) use of anticoagulants: Secondary | ICD-10-CM | POA: Insufficient documentation

## 2022-08-29 DIAGNOSIS — E1151 Type 2 diabetes mellitus with diabetic peripheral angiopathy without gangrene: Secondary | ICD-10-CM

## 2022-08-29 DIAGNOSIS — R918 Other nonspecific abnormal finding of lung field: Secondary | ICD-10-CM | POA: Diagnosis not present

## 2022-08-29 DIAGNOSIS — I499 Cardiac arrhythmia, unspecified: Secondary | ICD-10-CM | POA: Diagnosis not present

## 2022-08-29 DIAGNOSIS — R062 Wheezing: Secondary | ICD-10-CM | POA: Diagnosis not present

## 2022-08-29 DIAGNOSIS — J439 Emphysema, unspecified: Secondary | ICD-10-CM | POA: Diagnosis not present

## 2022-08-29 DIAGNOSIS — I517 Cardiomegaly: Secondary | ICD-10-CM | POA: Diagnosis not present

## 2022-08-29 DIAGNOSIS — J90 Pleural effusion, not elsewhere classified: Secondary | ICD-10-CM | POA: Diagnosis not present

## 2022-08-29 DIAGNOSIS — R0602 Shortness of breath: Secondary | ICD-10-CM | POA: Diagnosis present

## 2022-08-29 LAB — CBC WITH DIFFERENTIAL/PLATELET
Abs Immature Granulocytes: 0.06 10*3/uL (ref 0.00–0.07)
Basophils Absolute: 0.1 10*3/uL (ref 0.0–0.1)
Basophils Relative: 1 %
Eosinophils Absolute: 0.2 10*3/uL (ref 0.0–0.5)
Eosinophils Relative: 2 %
HCT: 39.6 % (ref 39.0–52.0)
Hemoglobin: 12.7 g/dL — ABNORMAL LOW (ref 13.0–17.0)
Immature Granulocytes: 1 %
Lymphocytes Relative: 31 %
Lymphs Abs: 3.6 10*3/uL (ref 0.7–4.0)
MCH: 27.6 pg (ref 26.0–34.0)
MCHC: 32.1 g/dL (ref 30.0–36.0)
MCV: 86.1 fL (ref 80.0–100.0)
Monocytes Absolute: 1 10*3/uL (ref 0.1–1.0)
Monocytes Relative: 8 %
Neutro Abs: 6.6 10*3/uL (ref 1.7–7.7)
Neutrophils Relative %: 57 %
Platelets: 194 10*3/uL (ref 150–400)
RBC: 4.6 MIL/uL (ref 4.22–5.81)
RDW: 15.4 % (ref 11.5–15.5)
WBC: 11.5 10*3/uL — ABNORMAL HIGH (ref 4.0–10.5)
nRBC: 0 % (ref 0.0–0.2)

## 2022-08-29 LAB — BASIC METABOLIC PANEL
Anion gap: 7 (ref 5–15)
BUN: 26 mg/dL — ABNORMAL HIGH (ref 8–23)
CO2: 28 mmol/L (ref 22–32)
Calcium: 9.2 mg/dL (ref 8.9–10.3)
Chloride: 106 mmol/L (ref 98–111)
Creatinine, Ser: 1.71 mg/dL — ABNORMAL HIGH (ref 0.61–1.24)
GFR, Estimated: 40 mL/min — ABNORMAL LOW (ref >60)
Glucose, Bld: 142 mg/dL — ABNORMAL HIGH (ref 70–99)
Potassium: 3.6 mmol/L (ref 3.5–5.1)
Sodium: 141 mmol/L (ref 135–145)

## 2022-08-29 LAB — BRAIN NATRIURETIC PEPTIDE: B Natriuretic Peptide: 587 pg/mL — ABNORMAL HIGH (ref 0.0–100.0)

## 2022-08-29 LAB — TROPONIN I (HIGH SENSITIVITY)
Troponin I (High Sensitivity): 21 ng/L — ABNORMAL HIGH (ref <18)
Troponin I (High Sensitivity): 19 ng/L — ABNORMAL HIGH (ref <18)

## 2022-08-29 MED ORDER — PREDNISONE 10 MG (21) PO TBPK: ORAL_TABLET | ORAL | 0 refills | Status: DC

## 2022-08-29 MED ORDER — DOXYCYCLINE HYCLATE 100 MG PO CAPS: 100.0000 mg | ORAL_CAPSULE | Freq: Two times a day (BID) | ORAL | 0 refills | Status: DC

## 2022-08-29 NOTE — ED Provider Notes (Signed)
[name] EMERGENCY DEPARTMENT AT [facility]  Provider Note  CSN: [id number] Arrival date & time: 08/29/22 5784  History Chief Complaint  Patient presents with   Shortness of Breath    Jason Fisher is a 82 y.o. male with multiple medical problems brought to the ED from home via EMS who reports they were called out for SOB. Patient reports he felt well when he went to bed, but woke up about 2 hours PTA feeling SOB. EMS reports he was wheezing, he was given nebs, solumedrol and HFA prior to arrival. Patient reports he is feeling better now. Has not had fever, productive cough or chest pains. No leg swelling. He has been otherwise compliant with his medications, including anticoagulation for Afib   Home Medications Prior to Admission medications   Medication Sig Start Date End Date Taking? Authorizing Provider  doxycycline (VIBRAMYCIN) 100 MG capsule Take 1 capsule (100 mg total) by mouth 2 (two) times daily. 08/29/22  Yes Pollyann Savoy, MD  predniSONE (STERAPRED UNI-PAK 21 TAB) 10 MG (21) TBPK tablet 10mg  Tabs, 6 day taper. Use as directed 08/29/22  Yes Pollyann Savoy, MD  ACCU-CHEK AVIVA PLUS test strip CHECK BLOOD SUGAR ONCE DAILY. DX E11.51 06/29/19   Philip Aspen, Limmie Patricia, MD  apixaban Everlene Balls) 2.5 MG TABS tablet TAKE ONE TABLET BY MOUTH AT [facility] AND AT BEDTIME 04/27/22   [place], Peter M, MD  atorvastatin (LIPITOR) 40 MG tablet TAKE ONE TABLET BY MOUTH ONCE DAILY 04/27/22   Philip Aspen, Limmie Patricia, MD  Continuous Blood Gluc Receiver (DEXCOM G6 RECEIVER) DEVI CHECK VLOOD SUGAR AS DIRECTED AS DIRECTED 10/23/21   Philip Aspen, Limmie Patricia, MD  Continuous Blood Gluc Sensor (DEXCOM G6 SENSOR) MISC APPLY 1 SENSOR EVERY 10 DAYS 10/14/21   Philip Aspen, Limmie Patricia, MD  Continuous Blood Gluc Transmit (DEXCOM G6 TRANSMITTER) MISC APPLY TO SENSOR EVERY 90 DAYS 10/23/21   Philip Aspen, Limmie Patricia, MD  Cyanocobalamin (B-12 COMPLIANCE INJECTION IJ) Inject as directed  every 30 (thirty) days.    [provider]  dextromethorphan (DELSYM) 30 MG/5ML liquid Take 5 mLs (30 mg total) by mouth 2 (two) times daily as needed for cough. 05/02/22   Johnson, Clanford L, MD  diltiazem (CARDIZEM CD) 360 MG 24 hr capsule Take 1 capsule (360 mg total) by mouth daily. 08/10/22   Philip Aspen, Limmie Patricia, MD  folic acid (FOLVITE) 1 MG tablet Take 1 tablet (1 mg total) by mouth daily. 05/03/22   Cleora Fleet, MD  furosemide (LASIX) 80 MG tablet TAKE ONE TABLET BY MOUTH ONCE DAILY 03/26/22   Philip Aspen, Limmie Patricia, MD  insulin degludec (TRESIBA FLEXTOUCH) 100 UNIT/ML FlexTouch Pen Inject 25 Units into the skin at bedtime. 05/02/22   Johnson, Clanford L, MD  ipratropium-albuterol (DUONEB) 0.5-2.5 (3) MG/3ML SOLN Take 3 mLs by nebulization every 4 (four) hours as needed (wheezing, shortness of breath, cough). 05/02/22   Johnson, Clanford L, MD  isosorbide mononitrate (IMDUR) 30 MG 24 hr tablet Take 1 tablet (30 mg total) by mouth daily. 10/13/21 10/08/22  [place], Peter M, MD  levalbuterol [facility] HFA) 45 MCG/ACT inhaler INHALE 1 TO 2 PUFFS BY MOUTH EVERY 6 HOURS AS NEEDED FOR WHEEZE 08/11/21   Leslye Peer, MD  metFORMIN (GLUCOPHAGE) 1000 MG tablet TAKE ONE TABLET BY MOUTH AT [facility] AND AT BEDTIME 03/26/22   Philip Aspen, Limmie Patricia, MD  metoprolol tartrate (LOPRESSOR) 100 MG tablet TAKE ONE TABLET BY MOUTH AT [facility]  AND AT BEDTIME 01/27/22   [place], Peter M, MD  nitroGLYCERIN (NITROSTAT) 0.4 MG SL tablet Place 1 tablet (0.4 mg total) under the tongue every 5 (five) minutes as needed for chest pain. X 3 doses 06/09/21   Philip Aspen, Limmie Patricia, MD  polyethylene glycol [facility] Ethelene Hal) packet Take 17 g by mouth daily as needed for mild constipation.    [provider]  potassium chloride SA (KLOR-CON M) 20 MEQ tablet Take 1 tablet (20 mEq total) by mouth every other day. 05/03/22   Johnson, Clanford L, MD  QUEtiapine (SEROQUEL) 25 MG tablet TAKE 0.5  TABLETS BY MOUTH AT BEDTIME. 07/06/22   Philip Aspen, Limmie Patricia, MD  rivastigmine (EXELON) 1.5 MG capsule Take 1 capsule (1.5 mg total) by mouth 2 (two) times daily. 06/30/22 01/26/23  Windell Norfolk, MD  STIOLTO RESPIMAT 2.5-2.5 MCG/ACT AERS INHALE TWO PUFFS BY MOUTH INTO LUNGS ONCE daily 07/02/22   Leslye Peer, MD     Allergies    Contrast media [iodinated contrast media]   Review of Systems   Review of Systems Please see HPI for pertinent positives and negatives  Physical Exam BP (!) 184/126   Pulse 85   Temp 97.7 F (36.5 C) (Oral)   Resp 16   Ht 5\' 8"  (1.727 m)   Wt 76.2 kg   SpO2 95%   BMI 25.54 kg/m   Physical Exam Vitals and nursing note reviewed.  Constitutional:      Appearance: Normal appearance.  HENT:     Head: Normocephalic and atraumatic.     Nose: Nose normal.     Mouth/Throat:     Mouth: Mucous membranes are moist.  Eyes:     Extraocular Movements: Extraocular movements intact.     Conjunctiva/sclera: Conjunctivae normal.  Cardiovascular:     Rate and Rhythm: Normal rate.  Pulmonary:     Effort: Pulmonary effort is normal.     Breath sounds: Wheezing (faint, end expiratory) present.  Abdominal:     General: Abdomen is flat.     Palpations: Abdomen is soft.     Tenderness: There is no abdominal tenderness.  Musculoskeletal:        General: No swelling. Normal range of motion.     Cervical back: Neck supple.     Right lower leg: No edema.     Left lower leg: No edema.  Skin:    General: Skin is warm and dry.  Neurological:     General: No focal deficit present.     Mental Status: He is alert.  Psychiatric:        Mood and Affect: Mood normal.     ED Results / Procedures / Treatments   EKG EKG Interpretation Date/Time:  Saturday August 29 2022 05:34:14 EDT Ventricular Rate:  74 PR Interval:    QRS Duration:  113 QT Interval:  354 QTC Calculation: 393 R Axis:   46  Text Interpretation: Atrial fibrillation Consider anterior infarct  Repol abnrm suggests ischemia, lateral leads No significant change since last tracing Confirmed by Susy Frizzle [phone]) on 08/29/2022 5:40:34 AM  Procedures Procedures  Medications Ordered in the ED Medications - No data to display  Initial Impression and Plan  Patient here with SOB, likely COPD but also consider PNA, CHF. Doubt PE given he is on anticoagulation. Check labs, CXR. He is resting comfortably now, feels better after EMS treatment.   ED Course   Clinical Course as of 08/29/22 0656  Sat Aug 29, 2022  0554 I personally viewed the images from radiology studies and agree with radiologist interpretation:  CXR consistent with chronic bronchitis. No acute changes.  [CS]  0554 CBC is unremarkable.  [CS]  0622 BMP is at baseline. Trop is mildly elevated, improved from previous.  [CS]  [id number] BNP is similar to previous.  [CS]  [id number] Patient continues to rest comfortably. Care will be signed out to the oncoming team pending delta trop and reassessment for dispo.  [CS]    Clinical Course User Index [CS] Pollyann Savoy, MD     MDM Rules/Calculators/A&P Medical Decision Making Problems Addressed: COPD exacerbation [facility]): acute illness or injury  Amount and/or Complexity of Data Reviewed Labs: ordered. Decision-making details documented in ED Course. Radiology: ordered and independent interpretation performed. Decision-making details documented in ED Course. ECG/medicine tests: ordered and independent interpretation performed. Decision-making details documented in ED Course.  Risk Prescription drug management. Decision regarding hospitalization.     Final Clinical Impression(s) / ED Diagnoses Final diagnoses:  COPD exacerbation (HCC)    Rx / DC Orders ED Discharge Orders          Ordered    predniSONE (STERAPRED UNI-PAK 21 TAB) 10 MG (21) TBPK tablet        08/29/22 0656    doxycycline (VIBRAMYCIN) 100 MG capsule  2 times daily        08/29/22 0656              Pollyann Savoy, MD 08/29/22 [phone]

## 2022-08-29 NOTE — ED Triage Notes (Signed)
EmS stated that pt's O2 at home on 2 L was 85. Pt stated that he began feeling SOB about two hours before calling for medics.

## 2022-08-29 NOTE — ED Provider Notes (Signed)
Signout from Dr. Bernette Mayers.  82 year old male brought in by EMS for shortness of breath from home.  Has received steroids breathing treatments and is symptomatically improving.  He is pending delta troponin and reassessment.  If appropriate for discharge prescriptions were already been sent to the pharmacy. Physical Exam  BP (!) 184/126   Pulse 85   Temp 97.7 F (36.5 C) (Oral)   Resp 16   Ht 5\' 8"  (1.727 m)   Wt 76.2 kg   SpO2 95%   BMI 25.54 kg/m   Physical Exam  Procedures  Procedures  ED Course / MDM   Clinical Course as of 08/29/22 0718  Sat Aug 29, 2022  0554 I personally viewed the images from radiology studies and agree with radiologist interpretation:  CXR consistent with chronic bronchitis. No acute changes.  [CS]  0554 CBC is unremarkable.  [CS]  0622 BMP is at baseline. Trop is mildly elevated, improved from previous.  [CS]  [id number] BNP is similar to previous.  [CS]  [id number] Patient continues to rest comfortably. Care will be signed out to the oncoming team pending delta trop and reassessment for dispo.  [CS]    Clinical Course User Index [CS] Pollyann Savoy, MD   Medical Decision Making Amount and/or Complexity of Data Reviewed Labs: ordered. Radiology: ordered.  Risk Prescription drug management.   8:30 AM.  Patient's delta troponin came back flat.  I spoke with the patient he says is feeling much better and his breathing is back to baseline.  He has oxygen at home to use as needed.  Prescriptions have been sent for antibiotics and steroids.  He has a doctor to follow-up with.  He is comfortable plan for discharge and understands return instructions.       Terrilee Files, MD 08/29/22 [phone]

## 2022-09-01 ENCOUNTER — Encounter: Payer: Self-pay | Admitting: Emergency Medicine

## 2022-09-01 ENCOUNTER — Ambulatory Visit: Payer: Medicare Other | Admitting: Emergency Medicine

## 2022-09-01 VITALS — BP 138/82 | HR 76 | Temp 97.3°F | Ht 68.0 in | Wt 168.0 lb

## 2022-09-01 DIAGNOSIS — J42 Unspecified chronic bronchitis: Secondary | ICD-10-CM

## 2022-09-01 DIAGNOSIS — J9611 Chronic respiratory failure with hypoxia: Secondary | ICD-10-CM

## 2022-09-01 DIAGNOSIS — J449 Chronic obstructive pulmonary disease, unspecified: Secondary | ICD-10-CM | POA: Diagnosis not present

## 2022-09-01 DIAGNOSIS — R918 Other nonspecific abnormal finding of lung field: Secondary | ICD-10-CM | POA: Diagnosis not present

## 2022-09-01 DIAGNOSIS — Z9981 Dependence on supplemental oxygen: Secondary | ICD-10-CM | POA: Diagnosis not present

## 2022-09-01 NOTE — Assessment & Plan Note (Signed)
Overall have been stable.  His nodular right lower lobe infiltrate noted when he was admitted for pneumonia has cleared.  No dedicated follow-up at this time plan

## 2022-09-01 NOTE — Assessment & Plan Note (Signed)
With a recent exacerbation that prompted ED visit.  It woke him from sleep, question whether some of this may have been cardiac in nature.  He did benefit from nebs, steroids so suspect COPD was at least part of his presentation.  He is completing the steroid taper, antibiotics.  This is a second flare this year and 1 of these was bad enough to have him admitted.  I will change him to [facility] to add the ICS component.  Follow-up in about 1 month to assess his status on the new medication.

## 2022-09-01 NOTE — Assessment & Plan Note (Signed)
He wears his oxygen 2 L/min with heavier exertion.  I have explained to him that he needs to monitor his SpO2, keep his saturations > 90%

## 2022-09-01 NOTE — Progress Notes (Signed)
Subjective:    Patient ID: Jason Fisher, male    DOB: 01-16-1941, 82 y.o.   MRN: [id number]  COPD His past medical history is significant for COPD.     ROV 09/01/22 --82 year old man with a history of very severe COPD.  He also has hypertension and chronic A-fib with diastolic CHF, diabetes, CAD.  I have followed him for his COPD and also pulmonary nodules (we had stopped following serial imaging).  He was last seen in our office in April 2024 after an admission for pneumonia and acute exacerbation in March.  A CT chest at that time showed an ill-defined patchy right lung nodular opacity.  His CT chest 06/25/2022 showed near complete resolution of the right lung opacity, consistent with resolved pneumonia.  There was some adherent mucus in the left mainstem and dependent left lower lobe with some bronchial thickening and mucoid impaction as well as a small left pleural effusion. Has been managed on Stiolto. He was seen in the [facility] emergency department 08/29/2022 for shortness of breath that woke him from sleep, improved with steroids, nebulized BD.  He was treated with corticosteroids, antibiotics and discharged out. Apparently he was hypertensive at the time. Today he reports that he is improving, not quite back to baseline. He wears O2 with longer exertion, knows to keep SpO2 > 90%.    Review of Systems As per HPI  Past Medical History:  Diagnosis Date   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 2007 Inf STEMI s/p Vision BMS  2 to RCA;  b. 2009 ISR Prox RCA Stent-->with DES;  c. 09/2015 PCI: LM nl, LAD 30p/m, D1 90, LCX nl, OM1 small, 80, OM2 90 (2.5x14 Biofreedom stent), OM3 small, RCA 30p ISR, 73m ISR, 20m, 85d, EF 50-55%.   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 On Eliquis (CHA2DS2VASc = 5).  Rate controlled w/ BB and CCB.   Chronic diastolic CHF (congestive heart  failure) (HCC)    a. 09/2015 Echo: EF 55-60%, mildly dil LA.   CKD (chronic kidney disease), stage III (HCC)    they mentioned this to the patient but they would work on it later after the aneurysm   COLONIC POLYPS, HX OF 11/02/2006   COPD (chronic obstructive pulmonary disease) (HCC)    DIABETES MELLITUS, TYPE II 10/29/2006   Diverticulosis    History of tobacco abuse    a. Quit 09/2015.   HYPERLIPIDEMIA 10/29/2006   Hypertension    Hypertensive heart disease with heart failure (HCC)    Pneumonia 09/2015   [name] 07/31/2016   STEMI (ST elevation myocardial infarction) [facility]) 2007   [name] 07/31/2016         Objective:   Physical Exam Vitals:   09/01/22 1117 09/01/22 1122  BP: (!) 142/82 138/82  Pulse: 76   Temp: (!) 97.3 F (36.3 C)   TempSrc: Temporal   SpO2: 96%   Weight: 168 lb (76.2 kg)   Height: 5\' 8"  (1.727 m)    Gen: Pleasant, well-nourished elderly man, in no distress,  normal affect  ENT: No lesions,  mouth clear,  oropharynx clear, no postnasal drip  Neck: No JVD, no stridor  Lungs: No use of accessory muscles, distant, clear without rales or rhonchi. No wheeze on forced exp  Cardiovascular: RRR, heart sounds normal, no murmur or gallops, no peripheral edema  Musculoskeletal: No deformities, no cyanosis or clubbing  Neuro: alert, non focal  Skin: Warm, no lesions or rashes       Assessment & Plan:  COPD (chronic obstructive pulmonary disease) (HCC) With a recent exacerbation that prompted ED visit.  It woke him from sleep, question whether some of this may have been cardiac in nature.  He did benefit from nebs, steroids so suspect COPD was at least part of his presentation.  He is completing the steroid taper, antibiotics.  This is a second flare this year and 1 of these was bad enough to have him admitted.  I will change him to [facility] to add the ICS component.  Follow-up in about 1 month to assess his status on the new medication.  Pulmonary  nodules Overall have been stable.  His nodular right lower lobe infiltrate noted when he was admitted for pneumonia has cleared.  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  I have explained to him that he needs to monitor his SpO2, keep his saturations > 90%   Levy Pupa, MD, PhD 09/01/2022, 12:00 PM  Pulmonary and Critical Care [phone] or if no answer [phone]

## 2022-09-01 NOTE — Patient Instructions (Signed)
Finish your antibiotics and prednisone as prescribed by the ED until completely gone We will try stopping Stiolto. Try starting Breztri 2 puffs twice a day.  Rinse and gargle after using.   Keep your oxygen available to use 2 L/min with heavy exertion.  Goal is to keep your oxygen saturation > 90% Follow with APP in about 1 month to assess your status on the new medication Follow with Dr Delton Coombes in 6 months or sooner if you have any problems

## 2022-09-02 MED ORDER — CYANOCOBALAMIN 1000 MCG/ML IJ SOLN
1000.0000 ug | Freq: Once | INTRAMUSCULAR | Status: AC
Start: 2022-09-02 — End: 2022-07-22
  Administered 2022-07-22: 1000 ug via INTRAMUSCULAR

## 2022-09-02 NOTE — Addendum Note (Signed)
Addended by: Kern Reap B on: 09/02/2022 01:00 PM   Modules accepted: Orders

## 2022-09-03 ENCOUNTER — Telehealth: Payer: Self-pay

## 2022-09-03 NOTE — Telephone Encounter (Signed)
Transition Care Management Follow-up Telephone Call Date of discharge and from where: 08/29/2022 [facility] How have you been since you were released from the hospital? Patient stated he is feeling much better. Any questions or concerns? No  Items Reviewed: Did the pt receive and understand the discharge instructions provided? Yes  Medications obtained and verified? Yes  Other? No  Any new allergies since your discharge? No  Dietary orders reviewed? Yes Do you have support at home? Yes   Follow up appointments reviewed:  PCP Hospital f/u appt confirmed? No  Scheduled to see  on  @ . Specialist Hospital f/u appt confirmed? Yes  Scheduled to see Leslye Peer, MD on 09/01/2022 @ [facility]. Are transportation arrangements needed? No  If their condition worsens, is the pt aware to call PCP or go to the Emergency Dept.? Yes Was the patient provided with contact information for the PCP's office or ED? Yes Was to pt encouraged to call back with questions or concerns? Yes  Shavon Ashmore [facility]  [facility] Population Health Community Resource Care Guide   [email or web address]  ?? [phone]   Website: [email or web address]

## 2022-09-07 ENCOUNTER — Encounter: Payer: Self-pay | Admitting: Nurse Practitioner

## 2022-09-07 ENCOUNTER — Ambulatory Visit: Payer: Medicare Other | Attending: Nurse Practitioner | Admitting: Nurse Practitioner

## 2022-09-07 VITALS — BP 106/70 | HR 64 | Ht 68.0 in | Wt 167.0 lb

## 2022-09-07 DIAGNOSIS — I714 Abdominal aortic aneurysm, without rupture, unspecified: Secondary | ICD-10-CM

## 2022-09-07 DIAGNOSIS — J439 Emphysema, unspecified: Secondary | ICD-10-CM | POA: Diagnosis not present

## 2022-09-07 DIAGNOSIS — I5032 Chronic diastolic (congestive) heart failure: Secondary | ICD-10-CM | POA: Diagnosis not present

## 2022-09-07 DIAGNOSIS — E785 Hyperlipidemia, unspecified: Secondary | ICD-10-CM

## 2022-09-07 DIAGNOSIS — Z9889 Other specified postprocedural states: Secondary | ICD-10-CM | POA: Diagnosis not present

## 2022-09-07 DIAGNOSIS — Z8679 Personal history of other diseases of the circulatory system: Secondary | ICD-10-CM | POA: Diagnosis not present

## 2022-09-07 DIAGNOSIS — I2510 Atherosclerotic heart disease of native coronary artery without angina pectoris: Secondary | ICD-10-CM

## 2022-09-07 DIAGNOSIS — I4821 Permanent atrial fibrillation: Secondary | ICD-10-CM

## 2022-09-07 DIAGNOSIS — I10 Essential (primary) hypertension: Secondary | ICD-10-CM

## 2022-09-07 NOTE — Progress Notes (Signed)
Office Visit    Patient Name: Jason Fisher Date of Encounter: 09/07/2022  Primary Care Provider:  Philip Aspen, Limmie Patricia, MD Primary Cardiologist:  Peter Swaziland, MD  Chief Complaint    82 year old male with a history of CAD s/p BMS-RCA in 2007, DES-RCA in 2009 (ISR), DES-OM2 in 2017, chronic diastolic heart failure, chronic atrial fibrillation on Eliquis, hypertension, hyperlipidemia, AAA, COPD, and tobacco use who presents for follow-up related to CAD and atrial fibrillation.   Past Medical History    Past Medical History:  Diagnosis Date   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 2007 Inf STEMI s/p Vision BMS  2 to RCA;  b. 2009 ISR Prox RCA Stent-->with DES;  c. 09/2015 PCI: LM nl, LAD 30p/m, D1 90, LCX nl, OM1 small, 80, OM2 90 (2.5x14 Biofreedom stent), OM3 small, RCA 30p ISR, 52m ISR, 68m, 85d, EF 50-55%.   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 On Eliquis (CHA2DS2VASc = 5).  Rate controlled w/ BB and CCB.   Chronic diastolic CHF (congestive heart failure) (HCC)    a. 09/2015 Echo: EF 55-60%, mildly dil LA.   CKD (chronic kidney disease), stage III (HCC)    they mentioned this to the patient but they would work on it later after the aneurysm   COLONIC POLYPS, HX OF 11/02/2006   COPD (chronic obstructive pulmonary disease) (HCC)    DIABETES MELLITUS, TYPE II 10/29/2006   Diverticulosis    History of tobacco abuse    a. Quit 09/2015.   HYPERLIPIDEMIA 10/29/2006   Hypertension    Hypertensive heart disease with heart failure (HCC)    Pneumonia 09/2015   [name] 07/31/2016   STEMI (ST elevation myocardial infarction) [facility] 2007   [name] 07/31/2016   Past Surgical History:  Procedure Laterality Date   ABDOMINAL AORTIC ENDOVASCULAR STENT GRAFT N/A 07/31/2016   Procedure: ABDOMINAL AORTIC ENDOVASCULAR STENT GRAFT;  Surgeon: Nada Libman, MD;  Location: [facility] OR;   Service: Vascular;  Laterality: N/A;   CARDIAC CATHETERIZATION N/A 09/26/2015   Procedure: Left Heart Cath and Coronary Angiography;  Surgeon: Peter M Swaziland, MD;  Location: [facility] INVASIVE CV LAB;  Service: Cardiovascular;  Laterality: N/A;   CARDIAC CATHETERIZATION N/A 09/26/2015   Procedure: Coronary Stent Intervention;  Surgeon: Peter M Swaziland, MD;  Location: [facility] INVASIVE CV LAB;  Service: Cardiovascular;  Laterality: N/A;   CATARACT EXTRACTION Bilateral    COLONOSCOPY     CORONARY ANGIOPLASTY WITH STENT PLACEMENT  2007   Inferior STEMI 2007 - RCA PCI with 2 Vision BMS; Abnormal Myoview in 2009 - pRCA ins-stent restenosis & unstented disease - DES PCI Promus 3.0 x 20 mm (3.5 mm)   ENDOVASCULAR REPAIR/STENT GRAFT  07/31/2016   INCISION AND DRAINAGE PERIRECTAL ABSCESS N/A 11/06/2015   Procedure: IRRIGATION AND DEBRIDEMENT PERIRECTAL ABSCESS, POSSIBLE HEMORRHOIDECTOMY;  Surgeon: Abigail Miyamoto, MD;  Location: [facility] OR;  Service: General;  Laterality: N/A;   ORIF SHOULDER FRACTURE Right    "fell out of back end of a truck"    Allergies  Allergies  Allergen Reactions   Contrast Media [Iodinated Contrast Media] Anaphylaxis     Labs/Other Studies Reviewed    The following studies were reviewed today:  Cardiac Studies & Procedures   CARDIAC CATHETERIZATION  CARDIAC CATHETERIZATION 09/26/2015  Narrative  Prox LAD to Mid LAD lesion, 30 %stenosed.  1st Diag  lesion, 90 %stenosed.  Ost 1st Mrg to 1st Mrg lesion, 80 %stenosed.  Ost RCA to Prox RCA lesion, 30 %stenosed.  Mid RCA-2 lesion, 40 %stenosed.  Mid RCA-1 lesion, 15 %stenosed.  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  There is mild left ventricular systolic dysfunction.  The left ventricular ejection fraction is 50-55% by visual estimate.  LV end diastolic pressure is moderately elevated.  A drug eluting stent was successfully placed.  2nd Mrg lesion, 90 %stenosed.  Post intervention, there  is a 0% residual stenosis.  1. 3 vessel obstructive CAD. - chronic severe stenosis in the first diagonal - New 90% stenosis in a large second OM - Stents in the proximal and mid RCA are patent - Aneurysm and stenosis in the PLOM branch of the RCA- the aneurysm is new compared to 2009 2. Mild LV dysfunction 3. Elevated LVEDP 4. Allergic response to contrast with hives and bronchospasm. 5. Successful stenting of the second OM with a Biofreedom stent.  Plan: continue IV diuresis. Patient received steroids, Benadryl, and IV pepcid for contrast reaction. Continue DAPT for one month then DC ASA. Resume Eliquis in am.  Findings Coronary Findings Diagnostic  Dominance: Right  Left Main Vessel was injected. Vessel is normal in caliber. Vessel is angiographically normal.  Left Anterior Descending The lesion is moderately calcified.  First Diagonal Branch The lesion is segmental.  Left Circumflex  First Obtuse Marginal Branch Vessel is small in size. The lesion is discrete.  Second Obtuse Marginal Branch The lesion is mildly calcified.  Third Obtuse Marginal Branch Vessel is small in size.  Right Coronary Artery The lesion was previously treated using a drug eluting stent over 2 years ago. The lesion was previously treated using a bare metal stent over 2 years ago. The lesion is eccentric. The lesion is discrete.  Intervention  2nd Mrg lesion Angioplasty Lesion length: 10 mm. Lesion crossed with guidewire using a WIRE ASAHI PROWATER 180CM. Pre-stent angioplasty was performed using a BALLOON EMERGE MR 2.5X12. Maximum pressure: 10 atm. A drug eluting stent was successfully placed. Stent strut is well apposed. Post-stent angioplasty was performed using a BALLOON [place] EMERGE MR [id number]. Maximum pressure: 14 atm. The pre-interventional distal flow is normal (TIMI 3).  The post-interventional distal flow is normal (TIMI 3). The intervention was successful . No complications occurred at  this lesion. 2.5 x 14 Biofreedom stent There is a 0% residual stenosis post intervention.     ECHOCARDIOGRAM  ECHOCARDIOGRAM COMPLETE 05/11/2020  Narrative ECHOCARDIOGRAM REPORT    Patient Name:   RASHIDI NAKAYAMA Date of Exam: 05/11/2020 Medical Rec #:  [id number]      Height:       68.0 in Accession #:    [id number]     Weight:       178.6 lb Date of Birth:  1940-07-07      BSA:          1.948 m Patient Age:    79 years       BP:           159/92 mmHg Patient Gender: M              HR:           100 bpm. [facility]  Procedure: 2D Echo, Cardiac Doppler and Color Doppler  Indications:    CHF  History:        Patient has prior history  of Echocardiogram examinations, most recent 11/05/2017. CHF, CAD and Previous Myocardial Infarction, COPD, Arrythmias:Atrial Fibrillation, Signs/Symptoms:Shortness of Breath; Risk Factors:Diabetes, Hypertension, Dyslipidemia and Former Smoker.  Sonographer:    Lavenia Atlas RDCS Referring Phys: [id number] OLADAPO ADEFESO  IMPRESSIONS   1. Left ventricular ejection fraction, by estimation, is 50 to 55%. The left ventricle has low normal function. The left ventricle has no regional wall motion abnormalities. There is moderate left ventricular hypertrophy. Left ventricular diastolic function could not be evaluated. 2. Right ventricular systolic function is normal. The right ventricular size is normal. There is normal pulmonary artery systolic pressure. 3. Left atrial size was mildly dilated. 4. The mitral valve is grossly normal. Trivial mitral valve regurgitation. 5. The aortic valve is grossly normal. Aortic valve regurgitation is trivial. 6. The inferior vena cava is dilated in size with >50% respiratory variability, suggesting right atrial pressure of 8 mmHg.  FINDINGS Left Ventricle: Left ventricular ejection fraction, by estimation, is 50 to 55%. The left ventricle has low normal function. The left ventricle has no regional wall  motion abnormalities. The left ventricular internal cavity size was normal in size. There is moderate left ventricular hypertrophy. Left ventricular diastolic function could not be evaluated due to atrial fibrillation. Left ventricular diastolic function could not be evaluated.  Right Ventricle: The right ventricular size is normal. No increase in right ventricular wall thickness. Right ventricular systolic function is normal. There is normal pulmonary artery systolic pressure. The tricuspid regurgitant velocity is 2.14 m/s, and with an assumed right atrial pressure of 3 mmHg, the estimated right ventricular systolic pressure is 21.3 mmHg.  Left Atrium: Left atrial size was mildly dilated.  Right Atrium: Right atrial size was normal in size.  Pericardium: There is no evidence of pericardial effusion.  Mitral Valve: The mitral valve is grossly normal. There is moderate thickening of the mitral valve leaflet(s). There is moderate calcification of the mitral valve leaflet(s). Trivial mitral valve regurgitation.  Tricuspid Valve: The tricuspid valve is normal in structure. Tricuspid valve regurgitation is trivial.  Aortic Valve: The aortic valve is grossly normal. Aortic valve regurgitation is trivial.  Pulmonic Valve: The pulmonic valve was normal in structure. Pulmonic valve regurgitation is not visualized.  Aorta: The aortic root and ascending aorta are structurally normal, with no evidence of dilitation.  Venous: The inferior vena cava is dilated in size with greater than 50% respiratory variability, suggesting right atrial pressure of 8 mmHg.  IAS/Shunts: No atrial level shunt detected by color flow Doppler.   LEFT VENTRICLE PLAX 2D LVIDd:         5.01 cm  Diastology LVIDs:         3.36 cm  LV e' medial:    0.06 cm/s LV PW:         1.47 cm  LV E/e' medial:  16.6 LV IVS:        1.32 cm  LV e' lateral:   0.08 cm/s LVOT diam:     2.40 cm  LV E/e' lateral: 12.4 LV SV:         58 LV SV  Index:   30 LVOT Area:     4.52 cm   RIGHT VENTRICLE RV Basal diam:  2.99 cm RV S prime:     8.05 cm/s TAPSE (M-mode): 2.4 cm  LEFT ATRIUM             Index       RIGHT ATRIUM  Index LA diam:        4.10 cm 2.11 cm/m  RA Area:     21.50 cm LA Vol (A2C):   58.3 ml 29.93 ml/m RA Volume:   59.60 ml  30.60 ml/m LA Vol (A4C):   70.7 ml 36.30 ml/m LA Biplane Vol: 66.4 ml 34.09 ml/m AORTIC VALVE LVOT Vmax:   73.10 cm/s LVOT Vmean:  48.500 cm/s LVOT VTI:    0.129 m  AORTA Ao Root diam: 3.50 cm  MITRAL VALVE               TRICUSPID VALVE MV Area (PHT): 5.88 cm    TR Peak grad:   18.3 mmHg MV Decel Time: 129 msec    TR Vmax:        214.00 cm/s MV E velocity: 0.96 cm/s MV A velocity: 33.40 cm/s  SHUNTS MV E/A ratio:  0.03        Systemic VTI:  0.13 m Systemic Diam: 2.40 cm  Kristeen Miss MD Electronically signed by Kristeen Miss MD Signature Date/Time: 05/11/2020/12:03:15 PM    Final            Recent Labs: 04/27/2022: ALT 20 05/01/2022: Magnesium 2.4 06/30/2022: TSH 2.020 08/29/2022: B Natriuretic Peptide 587.0; BUN 26; Creatinine, Ser 1.71; Hemoglobin 12.7; Platelets 194; Potassium 3.6; Sodium 141  Recent Lipid Panel    Component Value Date/Time   CHOL 112 10/13/2021 1119   TRIG 70 10/13/2021 1119   HDL 39 (L) 10/13/2021 1119   CHOLHDL 2.9 10/13/2021 1119   CHOLHDL 3 02/10/2021 1404   VLDL 18.8 02/10/2021 1404   LDLCALC 58 10/13/2021 1119    History of Present Illness    82 year old male with the above past medical history including CAD s/p BMS-RCA in 2007, DES-RCA in 2009 (ISR), DES-OM2 in 2017, chronic diastolic heart failure, chronic atrial fibrillation on Eliquis, hypertension, hyperlipidemia, AAA s/p repair, COPD, and tobacco use.   He was previously admitted in March 2018 for atrial fibrillation with RVR.  He underwent AAA endovascular stent grafting by Dr. Myra Gianotti in June 2018.  Echocardiogram in 11/21/2017 showed EF 60 to 65%, mildly increased  aortic root measuring 40 mm, moderately dilated left and right atrium.  He was hospitalized in April 2022 in the setting of worsening dyspnea on exertion, acute on chronic diastolic heart failure, atrial fibrillation with RVR.  Echocardiogram during that admission showed EF 50 to 55%, no RWMA, moderate LVH.  He was last seen in the office on 11/20/2021 and noted shortness of breath and intermittent dizziness.  He also noted some chest tightness, improved with Imdur.  It was noted that should he have progressive symptoms, he would likely require repeat cardiac catheterization (of note patient has a history of contrast reaction).  He was hospitalized in March 2024 in the setting of acute respiratory failure with hypoxia secondary to pneumonia, COPD exacerbation, atrial fibrillation with RVR.  He was treated with steroids and antibiotics.  HR improved with IV diltiazem.  He saw his PCP in 05/2022 and noted insomnia, behavioral disturbances and hallucinations.  He was referred to neurology. He was last seen in the office on 06/05/2022 and was stable from a cardiac standpoint.  He noted stable chronic dyspnea, he denied any other symptoms concerning for angina.  He was evaluated in the ED on 08/29/2022 in the setting of possible COPD exacerbation, improved with nebulizer treatments, steroids and antibiotics.  Troponin was mildly elevated with flat trend. BNP was mildly elevated.  He was  discharged home in stable condition and advised to follow-up with pulmonology and cardiology as an outpatient.   He presents today for follow-up accompanied by his wife.  Since his last visit he has been stable from a cardiac standpoint.  His shortness of breath has improved following treatment of his COPD exacerbation.  He denies any chest pain, edema, PND, orthopnea, weight gain.  He continues to note occasional dizziness with position changes, denies palpitations, presyncope, or syncope.  He has been experiencing acute left shoulder pain  that is musculoskeletal in nature, he received a cortisone injection with little improvement.  He plans to follow-up with his PCP to discuss ongoing management.  Other than his occasional dizziness and recent left shoulder pain, he reports feeling well.  Home Medications    Current Outpatient Medications  Medication Sig Dispense Refill   ACCU-CHEK AVIVA PLUS test strip CHECK BLOOD SUGAR ONCE DAILY. DX E11.51 100 strip 2   apixaban (ELIQUIS) 2.5 MG TABS tablet TAKE ONE TABLET BY MOUTH AT BREAKFAST AND AT BEDTIME 180 tablet 1   atorvastatin (LIPITOR) 40 MG tablet TAKE ONE TABLET BY MOUTH ONCE DAILY 90 tablet 1   Continuous Blood Gluc Receiver (DEXCOM G6 RECEIVER) DEVI CHECK VLOOD SUGAR AS DIRECTED AS DIRECTED 1 each 0   Continuous Blood Gluc Sensor (DEXCOM G6 SENSOR) MISC APPLY 1 SENSOR EVERY 10 DAYS 3 each 2   Continuous Blood Gluc Transmit (DEXCOM G6 TRANSMITTER) MISC APPLY TO SENSOR EVERY 90 DAYS 1 each 0   Cyanocobalamin (B-12 COMPLIANCE INJECTION IJ) Inject as directed every 30 (thirty) days.     dextromethorphan (DELSYM) 30 MG/5ML liquid Take 5 mLs (30 mg total) by mouth 2 (two) times daily as needed for cough. 89 mL 1   diltiazem (CARDIZEM CD) 360 MG 24 hr capsule Take 1 capsule (360 mg total) by mouth daily. 90 capsule 1   doxycycline (VIBRAMYCIN) 100 MG capsule Take 1 capsule (100 mg total) by mouth 2 (two) times daily. 20 capsule 0   folic acid (FOLVITE) 1 MG tablet Take 1 tablet (1 mg total) by mouth daily. 30 tablet 1   furosemide (LASIX) 80 MG tablet TAKE ONE TABLET BY MOUTH ONCE DAILY 90 tablet 1   ipratropium-albuterol (DUONEB) 0.5-2.5 (3) MG/3ML SOLN Take 3 mLs by nebulization every 4 (four) hours as needed (wheezing, shortness of breath, cough). 360 mL 1   isosorbide mononitrate (IMDUR) 30 MG 24 hr tablet Take 1 tablet (30 mg total) by mouth daily. 90 tablet 3   levalbuterol (XOPENEX HFA) 45 MCG/ACT inhaler INHALE 1 TO 2 PUFFS BY MOUTH EVERY 6 HOURS AS NEEDED FOR WHEEZE 15 each 3    metFORMIN (GLUCOPHAGE) 1000 MG tablet TAKE ONE TABLET BY MOUTH AT BREAKFAST AND AT BEDTIME 180 tablet 1   metoprolol tartrate (LOPRESSOR) 100 MG tablet TAKE ONE TABLET BY MOUTH AT BREAKFAST AND AT BEDTIME 180 tablet 3   nitroGLYCERIN (NITROSTAT) 0.4 MG SL tablet Place 1 tablet (0.4 mg total) under the tongue every 5 (five) minutes as needed for chest pain. X 3 doses 25 tablet 4   polyethylene glycol (MIRALAX / GLYCOLAX) packet Take 17 g by mouth daily as needed for mild constipation.     potassium chloride SA (KLOR-CON M) 20 MEQ tablet Take 1 tablet (20 mEq total) by mouth every other day. 15 tablet 0   predniSONE (STERAPRED UNI-PAK 21 TAB) 10 MG (21) TBPK tablet 10mg  Tabs, 6 day taper. Use as directed 1 each 0   QUEtiapine (SEROQUEL) 25  MG tablet TAKE 0.5 TABLETS BY MOUTH AT BEDTIME. 45 tablet 2   rivastigmine (EXELON) 1.5 MG capsule Take 1 capsule (1.5 mg total) by mouth 2 (two) times daily. 60 capsule 6   STIOLTO RESPIMAT 2.5-2.5 MCG/ACT AERS INHALE TWO PUFFS BY MOUTH INTO LUNGS ONCE daily 4 g 5   TRESIBA FLEXTOUCH 100 UNIT/ML FlexTouch Pen Inject 32 units into THE SKIN AT bedtime 15 mL 2   No current facility-administered medications for this visit.     Review of Systems    He denies chest pain, palpitations, dyspnea, pnd, orthopnea, n, v, syncope, edema, weight gain, or early satiety. All other systems reviewed and are otherwise negative except as noted above.   Physical Exam    VS:  BP 106/70   Pulse 64   Ht 5\' 8"  (1.727 m)   Wt 167 lb (75.8 kg)   BMI 25.39 kg/m   GEN: Well nourished, well developed, in no acute distress. HEENT: normal. Neck: Supple, no JVD, carotid bruits, or masses. Cardiac: IRIR, no murmurs, rubs, or gallops. No clubbing, cyanosis, edema.  Radials/DP/PT 2+ and equal bilaterally.  Respiratory:  Respirations regular and unlabored, clear to auscultation bilaterally. GI: Soft, nontender, nondistended, BS + x 4. MS: no deformity or atrophy. Skin: warm and dry,  no rash. Neuro:  Strength and sensation are intact. Psych: Normal affect.  Accessory Clinical Findings    ECG personally reviewed by me today - EKG Interpretation Date/Time:  Monday September 07 2022 15:07:28 EDT Ventricular Rate:  64 PR Interval:    QRS Duration:  104 QT Interval:  414 QTC Calculation: 427 R Axis:   17  Text Interpretation: Atrial fibrillation with a competing junctional pacemaker Nonspecific ST and T wave abnormality When compared with ECG of 29-Aug-2022 05:34, PREVIOUS ECG IS PRESENT Confirmed by Bernadene Person ([id number]) on 09/07/2022 3:11:46 PM  - no acute changes.   Lab Results  Component Value Date   WBC 11.5 (H) 08/29/2022   HGB 12.7 (L) 08/29/2022   HCT 39.6 08/29/2022   MCV 86.1 08/29/2022   PLT 194 08/29/2022   Lab Results  Component Value Date   CREATININE 1.71 (H) 08/29/2022   BUN 26 (H) 08/29/2022   NA 141 08/29/2022   K 3.6 08/29/2022   CL 106 08/29/2022   CO2 28 08/29/2022   Lab Results  Component Value Date   ALT 20 04/27/2022   AST 24 04/27/2022   ALKPHOS 71 04/27/2022   BILITOT 0.8 04/27/2022   Lab Results  Component Value Date   CHOL 112 10/13/2021   HDL 39 (L) 10/13/2021   LDLCALC 58 10/13/2021   TRIG 70 10/13/2021   CHOLHDL 2.9 10/13/2021    Lab Results  Component Value Date   HGBA1C 6.8 (H) 04/29/2022    Assessment & Plan    1. CAD: S/p BMS-RCA in 2007, DES-RCA in 2009 (ISR), DES-OM2 in 2017.  Stable chronic dyspnea on exertion, denies chest pain. No ASA in the setting of chronic DOAC therapy.  Continue metoprolol, diltiazem, Imdur, and Lipitor.   2. Chronic diastolic heart failure: Most recent echo in 05/2020 showed EF 50 to 55%, no RWMA, moderate LVH.  He notes stable chronic dyspnea on exertion. Generally euvolemic and well compensated on exam.  Continue current medications as above, continue Lasix.   3. Chronic atrial fibrillation: Rate controlled.  Continue metoprolol, diltiazem, Eliquis.   4. Hypertension: BP well  controlled, low in office today. Reports stable mild orthostatic dizziness. Continue to monitor BP/HR.  If BP and HR remain low, consider decreasing diltiazem. For now, continue current antihypertensive regimen.    5. Hyperlipidemia: LDL was 58 in 10/2021. Will update fasting lipid panel, CMET. Continue Lipitor.   6. AAA: S/p repair in 2018. Follows with vascular surgery.    7. COPD: He has stable chronic dyspnea.  He is no longer smoking. Follows with pulmonology.    8. Disposition: Follow-up in 6 months, sooner if needed.       [name], NP 09/07/2022, 3:51 PM

## 2022-09-07 NOTE — Patient Instructions (Signed)
Medication Instructions:  Your physician recommends that you continue on your current medications as directed. Please refer to the Current Medication list given to you today.  *If you need a refill on your cardiac medications before your next appointment, please call your pharmacy*   Lab Work: Fasting Lipid panel & CMET at your convenience  Testing/Procedures: NONE ordered at this time of appointment     Follow-Up: At [facility], you and your health needs are our priority.  As part of our continuing mission to provide you with exceptional heart care, we have created designated Provider Care Teams.  These Care Teams include your primary Cardiologist (physician) and Advanced Practice Providers (APPs -  Physician Assistants and Nurse Practitioners) who all work together to provide you with the care you need, when you need it.  We recommend signing up for the patient portal called "MyChart".  Sign up information is provided on this After Visit Summary.  MyChart is used to connect with patients for Virtual Visits (Telemedicine).  Patients are able to view lab/test results, encounter notes, upcoming appointments, etc.  Non-urgent messages can be sent to your provider as well.   To learn more about what you can do with MyChart, go to [email or web address].    Your next appointment:   6 month(s)  Provider:   Peter Swaziland, MD

## 2022-09-17 ENCOUNTER — Other Ambulatory Visit: Payer: Self-pay | Admitting: Internal Medicine

## 2022-09-17 ENCOUNTER — Other Ambulatory Visit: Payer: Self-pay | Admitting: Cardiology

## 2022-09-17 DIAGNOSIS — E1151 Type 2 diabetes mellitus with diabetic peripheral angiopathy without gangrene: Secondary | ICD-10-CM

## 2022-09-17 DIAGNOSIS — I5033 Acute on chronic diastolic (congestive) heart failure: Secondary | ICD-10-CM

## 2022-09-20 ENCOUNTER — Emergency Department (HOSPITAL_BASED_OUTPATIENT_CLINIC_OR_DEPARTMENT_OTHER)
Admission: EM | Admit: 2022-09-20 | Discharge: 2022-09-20 | Disposition: A | Discharge status: Home / Self Care | Payer: Medicare Other | Attending: Emergency Medicine | Admitting: Emergency Medicine

## 2022-09-20 ENCOUNTER — Emergency Department (HOSPITAL_BASED_OUTPATIENT_CLINIC_OR_DEPARTMENT_OTHER): Payer: Medicare Other | Admitting: Radiology

## 2022-09-20 ENCOUNTER — Encounter (HOSPITAL_BASED_OUTPATIENT_CLINIC_OR_DEPARTMENT_OTHER): Payer: Self-pay | Admitting: Emergency Medicine

## 2022-09-20 DIAGNOSIS — G8929 Other chronic pain: Secondary | ICD-10-CM | POA: Insufficient documentation

## 2022-09-20 DIAGNOSIS — Z7901 Long term (current) use of anticoagulants: Secondary | ICD-10-CM | POA: Diagnosis not present

## 2022-09-20 DIAGNOSIS — M25512 Pain in left shoulder: Secondary | ICD-10-CM | POA: Diagnosis not present

## 2022-09-20 DIAGNOSIS — Z79899 Other long term (current) drug therapy: Secondary | ICD-10-CM | POA: Insufficient documentation

## 2022-09-20 DIAGNOSIS — S4982XA Other specified injuries of left shoulder and upper arm, initial encounter: Secondary | ICD-10-CM | POA: Diagnosis not present

## 2022-09-20 DIAGNOSIS — M19012 Primary osteoarthritis, left shoulder: Secondary | ICD-10-CM | POA: Diagnosis not present

## 2022-09-20 MED ORDER — OXYCODONE HCL 5 MG PO TABS: 5.0000 mg | ORAL_TABLET | ORAL | 0 refills | Status: DC | PRN

## 2022-09-20 MED ORDER — LIDOCAINE 5 % EX PTCH
1.0000 | MEDICATED_PATCH | CUTANEOUS | Status: DC
  Filled 2022-09-20: qty 1

## 2022-09-20 NOTE — ED Triage Notes (Signed)
Pt presents to ED POV. Pt c/o L shoulder x3w. Denies injury/insult. Has cortisone shot w/o relief. Hx of rotator cuff surgery 15y ago

## 2022-09-20 NOTE — ED Provider Notes (Signed)
[place] EMERGENCY DEPARTMENT AT [facility] Provider Note   CSN: [id number] Arrival date & time: 09/20/22  1331     History Chief Complaint  Patient presents with   Shoulder Pain    Jason Fisher is a 81 y.o. male with h/o chronic left shoulder pain presents to the ER today for exacerbation of pain for the past 3 weeks. He denies any new injury to the area. He was initially seen by his PCP for this issue and was given a cortisone injection.  He reports he does not think the cortisone injection helped him much.  His wife's been giving him "pain cream" as well as trying heat wraps.  She is also been giving him ibuprofen for pain.  Patient reports that he feels some relief with ibuprofen.  He has had rotator cuff surgery 15 years ago and has intermittent pain.  He denies any fevers, chest pain, or shortness of breath.  He denies any radiation of his pain or any numbness or tingling.  He denies any swelling to the area.  He reports he has pain he feels like he cannot lift his arm above his head 2/2 pain.    Shoulder Pain Associated symptoms: no back pain, no fever and no neck pain        Home Medications Prior to Admission medications   Medication Sig Start Date End Date Taking? Authorizing Provider  oxyCODONE (ROXICODONE) 5 MG immediate release tablet Take 1 tablet (5 mg total) by mouth every 4 (four) hours as needed for severe pain. 09/20/22  Yes Achille Rich, PA-C  ACCU-CHEK AVIVA PLUS test strip CHECK BLOOD SUGAR ONCE DAILY. DX E11.51 06/29/19   Philip Aspen, Limmie Patricia, MD  apixaban Everlene Balls) 2.5 MG TABS tablet TAKE ONE TABLET BY MOUTH AT [facility] AND AT BEDTIME 04/27/22   [place], Peter M, MD  atorvastatin (LIPITOR) 40 MG tablet TAKE ONE TABLET BY MOUTH ONCE DAILY 04/27/22   Philip Aspen, Limmie Patricia, MD  Continuous Blood Gluc Receiver (DEXCOM G6 RECEIVER) DEVI CHECK VLOOD SUGAR AS DIRECTED AS DIRECTED 10/23/21   Philip Aspen, Limmie Patricia, MD  Continuous Blood Gluc  Sensor (DEXCOM G6 SENSOR) MISC APPLY 1 SENSOR EVERY 10 DAYS 10/14/21   Philip Aspen, Limmie Patricia, MD  Continuous Blood Gluc Transmit (DEXCOM G6 TRANSMITTER) MISC APPLY TO SENSOR EVERY 90 DAYS 10/23/21   Philip Aspen, Limmie Patricia, MD  Cyanocobalamin (B-12 COMPLIANCE INJECTION IJ) Inject as directed every 30 (thirty) days.    [provider]  dextromethorphan (DELSYM) 30 MG/5ML liquid Take 5 mLs (30 mg total) by mouth 2 (two) times daily as needed for cough. 05/02/22   Johnson, Clanford L, MD  diltiazem (CARDIZEM CD) 360 MG 24 hr capsule Take 1 capsule (360 mg total) by mouth daily. 08/10/22   Philip Aspen, Limmie Patricia, MD  doxycycline (VIBRAMYCIN) 100 MG capsule Take 1 capsule (100 mg total) by mouth 2 (two) times daily. 08/29/22   Pollyann Savoy, MD  folic acid (FOLVITE) 1 MG tablet Take 1 tablet (1 mg total) by mouth daily. 05/03/22   Johnson, Clanford L, MD  furosemide (LASIX) 80 MG tablet TAKE ONE TABLET BY MOUTH ONCE DAILY 09/17/22   Philip Aspen, Limmie Patricia, MD  ipratropium-albuterol (DUONEB) 0.5-2.5 (3) MG/3ML SOLN Take 3 mLs by nebulization every 4 (four) hours as needed (wheezing, shortness of breath, cough). 05/02/22   Johnson, Clanford L, MD  isosorbide mononitrate (IMDUR) 30 MG 24 hr tablet TAKE ONE TABLET BY MOUTH ONCE DAILY 09/17/22  [place], Peter M, MD  levalbuterol [facility] HFA) 45 MCG/ACT inhaler INHALE 1 TO 2 PUFFS BY MOUTH EVERY 6 HOURS AS NEEDED FOR WHEEZE 08/11/21   Leslye Peer, MD  metFORMIN (GLUCOPHAGE) 1000 MG tablet TAKE ONE TABLET BY MOUTH AT [facility] AND AT BEDTIME 09/17/22   Philip Aspen, Limmie Patricia, MD  metoprolol tartrate (LOPRESSOR) 100 MG tablet TAKE ONE TABLET BY MOUTH AT [facility] AND AT BEDTIME 01/27/22   [place], Peter M, MD  nitroGLYCERIN (NITROSTAT) 0.4 MG SL tablet Place 1 tablet (0.4 mg total) under the tongue every 5 (five) minutes as needed for chest pain. X 3 doses 06/09/21   Philip Aspen, Limmie Patricia, MD  polyethylene glycol [facility] Ethelene Hal)  packet Take 17 g by mouth daily as needed for mild constipation.    [provider]  potassium chloride SA (KLOR-CON M) 20 MEQ tablet Take 1 tablet (20 mEq total) by mouth every other day. 05/03/22   Johnson, Clanford L, MD  predniSONE (STERAPRED UNI-PAK 21 TAB) 10 MG (21) TBPK tablet 10mg  Tabs, 6 day taper. Use as directed 08/29/22   Pollyann Savoy, MD  QUEtiapine (SEROQUEL) 25 MG tablet TAKE 0.5 TABLETS BY MOUTH AT BEDTIME. 07/06/22   Philip Aspen, Limmie Patricia, MD  rivastigmine (EXELON) 1.5 MG capsule Take 1 capsule (1.5 mg total) by mouth 2 (two) times daily. 06/30/22 01/26/23  Windell Norfolk, MD  STIOLTO RESPIMAT 2.5-2.5 MCG/ACT AERS INHALE TWO PUFFS BY MOUTH INTO LUNGS ONCE daily 07/02/22   Leslye Peer, MD  TRESIBA FLEXTOUCH 100 UNIT/ML FlexTouch Pen Inject 32 units into THE SKIN AT bedtime 08/31/22   Philip Aspen, Limmie Patricia, MD      Allergies    Contrast media [iodinated contrast media]    Review of Systems   Review of Systems  Constitutional:  Negative for chills and fever.  Respiratory:  Negative for shortness of breath.   Cardiovascular:  Negative for chest pain.  Musculoskeletal:  Positive for arthralgias. Negative for back pain and neck pain.    Physical Exam Updated Vital Signs BP 124/76 (BP Location: Left Arm)   Pulse 73   Temp 97.7 F (36.5 C) (Oral)   Resp 16   SpO2 93%  Physical Exam Vitals and nursing note reviewed.  Constitutional:      General: He is not in acute distress.    Appearance: He is not ill-appearing or toxic-appearing.  Eyes:     General: No scleral icterus. Cardiovascular:     Rate and Rhythm: Normal rate.     Pulses:          Radial pulses are 2+ on the right side and 2+ on the left side.  Pulmonary:     Effort: Pulmonary effort is normal. No respiratory distress.  Abdominal:     Palpations: Abdomen is soft.     Tenderness: There is no abdominal tenderness. There is no guarding or rebound.  Musculoskeletal:        General:  Tenderness present.     Cervical back: No tenderness.     Comments: Patient has equal strength in upper extremities.  Sensation reportedly intact bilaterally.  Palpable pulses bilaterally.  Cap refill brisk in all 5 fingers.  Compartments are soft.  Patient has tenderness more to the anterior aspect of the shoulder.  No obvious swelling or overlying skin changes noted.  No overlying warmth, erythema, fluctuance, or induration.  No overlying rash.  He has pain with lifting his arm above his shoulder, however I  do not appreciate any weakness.  Skin:    General: Skin is warm and dry.  Neurological:     Mental Status: He is alert.     ED Results / Procedures / Treatments   Labs (all labs ordered are listed, but only abnormal results are displayed) Labs Reviewed - No data to display  EKG None  Radiology DG Shoulder Left  Result Date: 09/20/2022 CLINICAL DATA:  Pain for 3 weeks, no known injury EXAM: LEFT SHOULDER - 2+ VIEW COMPARISON:  None Available. FINDINGS: There is no evidence of fracture or dislocation. Mild acromioclavicular and glenohumeral arthrosis. Soft tissues are unremarkable. IMPRESSION: No fracture or dislocation of the left shoulder. Mild acromioclavicular and glenohumeral arthrosis. Electronically Signed   By: Jearld Lesch M.D.   On: 09/20/2022 14:35    Procedures Procedures   Medications Ordered in ED Medications  lidocaine (LIDODERM) 5 % 1 patch (has no administration in time range)    ED Course/ Medical Decision Making/ A&P    Medical Decision Making Amount and/or Complexity of Data Reviewed Radiology: ordered.   82 y.o. male presents to the ER today for evaluation of left shoulder pain. Differential diagnosis includes but is not limited to sprain, strain, dislocation, fracture, arthritis, rotator cuff injury. Vital signs unremarkable. Physical exam as noted above.   XR of the left shoulder shows No fracture or dislocation of the left shoulder. Mild  acromioclavicular and glenohumeral arthrosis. Per radiology read.   Lidocaine patch given as well as shoulder sling.  Will give him referral to orthopedic surgery.  I do not think this is any cardiac involvement or pulmonary involvement.  His pain is only reproduced upon movement and with some palpation.  It is likely musculoskeletal in nature.  I discussed with his wife and patient is no longer use ibuprofen as this is contraindicated with his Eliquis.  He is not having any belly pain or any black or bloody stools.  I do not think any lab work needed at this time.  Recommended Tylenol 1000 mg every 6 hours for pain.  Will prescribe him a short course of narcotics as well for breakthrough pain.  I have a low station for any septic arthritis given he does not have any overlying warmth erythema nor does he have any fever.  I likely think this is a rotator cuff problem.  Stable for discharge home with orthopedic follow-up.  We discussed plan at bedside. We discussed strict return precautions and red flag symptoms. The patient verbalized their understanding and agrees to the plan. The patient is stable and being discharged home in good condition.  Portions of this report may have been transcribed using voice recognition software. Every effort was made to ensure accuracy; however, inadvertent computerized transcription errors may be present.   Final Clinical Impression(s) / ED Diagnoses Final diagnoses:  Chronic left shoulder pain    Rx / DC Orders ED Discharge Orders          Ordered    oxyCODONE (ROXICODONE) 5 MG immediate release tablet  Every 4 hours PRN        09/20/22 1553              Achille Rich, PA-C 09/20/22 1553    Rondel Baton, MD 09/21/22 [id number]

## 2022-09-20 NOTE — Discharge Instructions (Addendum)
You were seen in the ER for evaluation of of your left shoulder pain.  Your x-ray shows some arthritis.  Given your presentation, likely this is a rotator cuff issue.  I have included information for orthopedic surgeon into the discharge paperwork for you to call to follow-up with as you may need future imaging.  Please make sure to call to schedule an appointment.  For pain, please DO NOT use any more ibuprofen as this is contraindicated with your Eliquis.  Instead, I would like for you to take Tylenol 1000 mg every 6 hours as needed for pain.  I have also prescribed you a few narcotic pain medications to take for breakthrough pain.  Please do not drive or operate any heavy machinery as this can make you sleepy.  Please be careful and monitor your reaction to the medications.  He can also still continue using the topical IcyHot you can also try topical lidocaine patches as well.  You can leave your arm in the shoulder sling if you find relief with your symptoms with it.  If you have any concerns, new or worsening symptoms, please return to the nearest emergency department for reevaluation.  Contact a doctor if: Your pain gets worse. Medicine does not help your pain. You have new pain in your arm, hand, or fingers. You loosen your sling and your arm, hand, or fingers: Tingle. Are numb. Are swollen. Get help right away if: Your arm, hand, or fingers turn white or blue.

## 2022-09-21 ENCOUNTER — Ambulatory Visit: Payer: Medicare Other

## 2022-09-22 ENCOUNTER — Telehealth: Payer: Self-pay | Admitting: Internal Medicine

## 2022-09-22 ENCOUNTER — Ambulatory Visit (INDEPENDENT_AMBULATORY_CARE_PROVIDER_SITE_OTHER): Payer: Medicare Other | Admitting: Internal Medicine

## 2022-09-22 ENCOUNTER — Encounter: Payer: Self-pay | Admitting: Internal Medicine

## 2022-09-22 VITALS — BP 110/80 | HR 65 | Temp 97.4°F | Ht 69.6 in | Wt 166.2 lb

## 2022-09-22 DIAGNOSIS — E538 Deficiency of other specified B group vitamins: Secondary | ICD-10-CM

## 2022-09-22 DIAGNOSIS — Z09 Encounter for follow-up examination after completed treatment for conditions other than malignant neoplasm: Secondary | ICD-10-CM

## 2022-09-22 DIAGNOSIS — M25512 Pain in left shoulder: Secondary | ICD-10-CM

## 2022-09-22 MED ORDER — CYANOCOBALAMIN 1000 MCG/ML IJ SOLN
1000.0000 ug | Freq: Once | INTRAMUSCULAR | Status: AC
Start: 2022-09-22 — End: 2022-09-22
  Administered 2022-09-22: 1000 ug via INTRAMUSCULAR

## 2022-09-22 MED ORDER — OXYCODONE HCL 5 MG PO TABS: 5.0000 mg | ORAL_TABLET | ORAL | 0 refills | Status: DC | PRN

## 2022-09-22 NOTE — Progress Notes (Signed)
Established Patient Office Visit     CC/Reason for Visit: ED follow-up, left shoulder pain  HPI: Jason Fisher is a 82 y.o. male who is coming in today for the above mentioned reasons.  About a month ago he fell down some steps, mechanism of injury is unknown.  About 2 days later he started experiencing left shoulder pain.  This is gotten worse to the point where he went to the ED yesterday.  X-ray was negative for fracture did show some arthritis.  He has had prior rotator cuff repair on that side.  He has Alzheimer's dementia.  Son and wife are present today.   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 2007 Inf STEMI s/p Vision BMS  2 to RCA;  b. 2009 ISR Prox RCA Stent-->with DES;  c. 09/2015 PCI: LM nl, LAD 30p/m, D1 90, LCX nl, OM1 small, 80, OM2 90 (2.5x14 Biofreedom stent), OM3 small, RCA 30p ISR, 25m ISR, 39m, 85d, EF 50-55%.   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 On Eliquis (CHA2DS2VASc = 5).  Rate controlled w/ BB and CCB.   Chronic diastolic CHF (congestive heart failure) (HCC)    a. 09/2015 Echo: EF 55-60%, mildly dil LA.   CKD (chronic kidney disease), stage III (HCC)    they mentioned this to the patient but they would work on it later after the aneurysm   COLONIC POLYPS, HX OF 11/02/2006   COPD (chronic obstructive pulmonary disease) (HCC)    DIABETES MELLITUS, TYPE II 10/29/2006   Diverticulosis    History of tobacco abuse    a. Quit 09/2015.   HYPERLIPIDEMIA 10/29/2006   Hypertension    Hypertensive heart disease with heart failure (HCC)    Pneumonia 09/2015   [name] 07/31/2016   STEMI (ST elevation myocardial infarction) [facility]) 2007   [name] 07/31/2016    Past Surgical History:  Procedure Laterality Date   ABDOMINAL AORTIC ENDOVASCULAR STENT GRAFT N/A 07/31/2016   Procedure:  ABDOMINAL AORTIC ENDOVASCULAR STENT GRAFT;  Surgeon: Nada Libman, MD;  Location: [facility] OR;  Service: Vascular;  Laterality: N/A;   CARDIAC CATHETERIZATION N/A 09/26/2015   Procedure: Left Heart Cath and Coronary Angiography;  Surgeon: Peter M Swaziland, MD;  Location: [facility] INVASIVE CV LAB;  Service: Cardiovascular;  Laterality: N/A;   CARDIAC CATHETERIZATION N/A 09/26/2015   Procedure: Coronary Stent Intervention;  Surgeon: Peter M Swaziland, MD;  Location: [facility] INVASIVE CV LAB;  Service: Cardiovascular;  Laterality: N/A;   CATARACT EXTRACTION Bilateral    COLONOSCOPY     CORONARY ANGIOPLASTY WITH STENT PLACEMENT  2007   Inferior STEMI 2007 - RCA PCI with 2 Vision BMS; Abnormal Myoview in 2009 - pRCA ins-stent restenosis & unstented disease - DES PCI Promus 3.0 x 20 mm (3.5 mm)   ENDOVASCULAR REPAIR/STENT GRAFT  07/31/2016   INCISION AND DRAINAGE PERIRECTAL ABSCESS N/A 11/06/2015   Procedure: IRRIGATION AND DEBRIDEMENT PERIRECTAL ABSCESS, POSSIBLE HEMORRHOIDECTOMY;  Surgeon: Abigail Miyamoto, MD;  Location: [facility] OR;  Service: General;  Laterality: N/A;   ORIF SHOULDER FRACTURE Right    "fell out of back end of a truck"    Social History:  reports that he quit smoking about 7 years ago. His smoking use included cigarettes. He started smoking about 56 years ago. He has a  12.3 pack-year smoking history. He has been exposed to tobacco smoke. He has never used smokeless tobacco. He reports that he does not drink alcohol and does not use drugs.  Allergies: Allergies  Allergen Reactions   Contrast Media [Iodinated Contrast Media] Anaphylaxis    Family History:  Family History  Problem Relation Age of Onset   Heart attack Mother    Diabetes Mother    Heart attack Father    Hypertension Father    Heart attack Brother    Diabetes Brother      Current Outpatient Medications:    ACCU-CHEK AVIVA PLUS test strip, CHECK BLOOD SUGAR ONCE DAILY. DX E11.51, Disp: 100 strip, Rfl: 2   apixaban (ELIQUIS) 2.5 MG  TABS tablet, TAKE ONE TABLET BY MOUTH AT BREAKFAST AND AT BEDTIME, Disp: 180 tablet, Rfl: 1   atorvastatin (LIPITOR) 40 MG tablet, TAKE ONE TABLET BY MOUTH ONCE DAILY, Disp: 90 tablet, Rfl: 1   Continuous Blood Gluc Receiver (DEXCOM G6 RECEIVER) DEVI, CHECK VLOOD SUGAR AS DIRECTED AS DIRECTED, Disp: 1 each, Rfl: 0   Continuous Blood Gluc Sensor (DEXCOM G6 SENSOR) MISC, APPLY 1 SENSOR EVERY 10 DAYS, Disp: 3 each, Rfl: 2   Continuous Blood Gluc Transmit (DEXCOM G6 TRANSMITTER) MISC, APPLY TO SENSOR EVERY 90 DAYS, Disp: 1 each, Rfl: 0   Cyanocobalamin (B-12 COMPLIANCE INJECTION IJ), Inject as directed every 30 (thirty) days., Disp: , Rfl:    dextromethorphan (DELSYM) 30 MG/5ML liquid, Take 5 mLs (30 mg total) by mouth 2 (two) times daily as needed for cough., Disp: 89 mL, Rfl: 1   diltiazem (CARDIZEM CD) 360 MG 24 hr capsule, Take 1 capsule (360 mg total) by mouth daily., Disp: 90 capsule, Rfl: 1   doxycycline (VIBRAMYCIN) 100 MG capsule, Take 1 capsule (100 mg total) by mouth 2 (two) times daily., Disp: 20 capsule, Rfl: 0   folic acid (FOLVITE) 1 MG tablet, Take 1 tablet (1 mg total) by mouth daily., Disp: 30 tablet, Rfl: 1   furosemide (LASIX) 80 MG tablet, TAKE ONE TABLET BY MOUTH ONCE DAILY, Disp: 90 tablet, Rfl: 1   ipratropium-albuterol (DUONEB) 0.5-2.5 (3) MG/3ML SOLN, Take 3 mLs by nebulization every 4 (four) hours as needed (wheezing, shortness of breath, cough)., Disp: 360 mL, Rfl: 1   isosorbide mononitrate (IMDUR) 30 MG 24 hr tablet, TAKE ONE TABLET BY MOUTH ONCE DAILY, Disp: 90 tablet, Rfl: 3   levalbuterol (XOPENEX HFA) 45 MCG/ACT inhaler, INHALE 1 TO 2 PUFFS BY MOUTH EVERY 6 HOURS AS NEEDED FOR WHEEZE, Disp: 15 each, Rfl: 3   metFORMIN (GLUCOPHAGE) 1000 MG tablet, TAKE ONE TABLET BY MOUTH AT BREAKFAST AND AT BEDTIME, Disp: 180 tablet, Rfl: 1   metoprolol tartrate (LOPRESSOR) 100 MG tablet, TAKE ONE TABLET BY MOUTH AT BREAKFAST AND AT BEDTIME, Disp: 180 tablet, Rfl: 3   nitroGLYCERIN  (NITROSTAT) 0.4 MG SL tablet, Place 1 tablet (0.4 mg total) under the tongue every 5 (five) minutes as needed for chest pain. X 3 doses, Disp: 25 tablet, Rfl: 4   oxyCODONE (ROXICODONE) 5 MG immediate release tablet, Take 1 tablet (5 mg total) by mouth every 4 (four) hours as needed for severe pain., Disp: 7 tablet, Rfl: 0   polyethylene glycol (MIRALAX / GLYCOLAX) packet, Take 17 g by mouth daily as needed for mild constipation., Disp: , Rfl:    potassium chloride SA (KLOR-CON M) 20 MEQ tablet, Take 1 tablet (20 mEq total) by mouth every other day., Disp: 15 tablet, Rfl: 0  predniSONE (STERAPRED UNI-PAK 21 TAB) 10 MG (21) TBPK tablet, 10mg  Tabs, 6 day taper. Use as directed, Disp: 1 each, Rfl: 0   QUEtiapine (SEROQUEL) 25 MG tablet, TAKE 0.5 TABLETS BY MOUTH AT BEDTIME., Disp: 45 tablet, Rfl: 2   rivastigmine (EXELON) 1.5 MG capsule, Take 1 capsule (1.5 mg total) by mouth 2 (two) times daily., Disp: 60 capsule, Rfl: 6   STIOLTO RESPIMAT 2.5-2.5 MCG/ACT AERS, INHALE TWO PUFFS BY MOUTH INTO LUNGS ONCE daily, Disp: 4 g, Rfl: 5   TRESIBA FLEXTOUCH 100 UNIT/ML FlexTouch Pen, Inject 32 units into THE SKIN AT bedtime, Disp: 15 mL, Rfl: 2  Review of Systems:  Negative unless indicated in HPI.   Physical Exam: Vitals:   09/22/22 1056  BP: 110/80  Pulse: 65  Temp: (!) 97.4 F (36.3 C)  TempSrc: Oral  SpO2: 91%  Weight: 166 lb 3.2 oz (75.4 kg)  Height: 5' 9.6" (1.768 m)    Body mass index is 24.12 kg/m.   Physical Exam Musculoskeletal:     Comments: Inability to perform both frontal and lateral raise of left arm past 90 degrees.  Severe pain with this motion.      Impression and Plan:  Hospital discharge follow-up  B12 deficiency -     Cyanocobalamin  Acute pain of left shoulder  -B12 injection given in office today. -He already has follow-up to see orthopedics tomorrow in regards to his left shoulder pain.  This has been present for about a month.  I am concerned he may have  reinjured his rotator cuff given this occurred after a fall.  He did receive intra-articular steroids on 7/24 without much relief.   Time spent:22 minutes reviewing chart, interviewing and examining patient and formulating plan of care.     Chaya Jan, MD [facility]

## 2022-09-22 NOTE — Telephone Encounter (Signed)
Pt wife called needing the [facility]  oxyCODONE (ROXICODONE) 5 MG immediate release tablet   [id number] [place], [place] - [address] [place] [address] Phone: [phone]  Fax: [phone]

## 2022-09-22 NOTE — Telephone Encounter (Signed)
Last filled by  Achille Rich, PA-C.  Okay to fill?

## 2022-09-22 NOTE — Progress Notes (Deleted)
 Per orders of Dr. Ardyth Harps, injection of B12  given by Stann Ore. Patient tolerated injection well.

## 2022-09-23 DIAGNOSIS — M25512 Pain in left shoulder: Secondary | ICD-10-CM | POA: Diagnosis not present

## 2022-09-25 NOTE — Telephone Encounter (Signed)
Pt calling in for a refill, says he is still in pain oxyCODONE (ROXICODONE) 5 MG immediate release tablet

## 2022-09-28 ENCOUNTER — Telehealth: Payer: Self-pay

## 2022-09-28 NOTE — Telephone Encounter (Signed)
Transition Care Management Unsuccessful Follow-up Telephone Call  Date of discharge and from where:  [facility] 8/18  Attempts:  1st Attempt  Reason for unsuccessful TCM follow-up call:  No answer/busy   Lenard Forth [place]  [facility], [facility] Guide, Phone: [phone] Website: [email or web address]

## 2022-09-28 NOTE — Telephone Encounter (Signed)
Spouse called to say Ortho is saying Pt needs to reach out to MD for pain medication, because they won't be able to see him until 10/06/22.  Spouse states she feels like they are getting the run around.  Pt is in a lot of pain and she does not know what else to do.   They are asking if MD could please send something, anything, just until they see Ortho on 10/06/22 for shot?  Please advise.

## 2022-09-29 ENCOUNTER — Other Ambulatory Visit: Payer: Self-pay | Admitting: Internal Medicine

## 2022-09-29 ENCOUNTER — Telehealth: Payer: Self-pay

## 2022-09-29 DIAGNOSIS — M25512 Pain in left shoulder: Secondary | ICD-10-CM

## 2022-09-29 MED ORDER — OXYCODONE HCL 5 MG PO TABS
5.0000 mg | ORAL_TABLET | ORAL | 0 refills | Status: DC | PRN
Start: 2022-09-29 — End: 2023-03-11

## 2022-09-29 NOTE — Telephone Encounter (Signed)
Transition Care Management Unsuccessful Follow-up Telephone Call  Date of discharge and from where:  [facility] 8/18  Attempts:  2nd Attempt  Reason for unsuccessful TCM follow-up call:  No answer/busy   Lenard [place]  [facility], [facility] Guide, Phone: [phone] Website: [email or web address]

## 2022-09-29 NOTE — Telephone Encounter (Signed)
Left detailed message on machine with Dr Hardie Shackleton recommendation.

## 2022-10-01 ENCOUNTER — Ambulatory Visit: Payer: Medicare Other | Admitting: Nurse Practitioner

## 2022-10-02 DIAGNOSIS — J449 Chronic obstructive pulmonary disease, unspecified: Secondary | ICD-10-CM | POA: Diagnosis not present

## 2022-10-06 ENCOUNTER — Telehealth: Payer: Self-pay | Admitting: Neurology

## 2022-10-06 ENCOUNTER — Other Ambulatory Visit: Payer: Self-pay

## 2022-10-06 ENCOUNTER — Telehealth: Payer: Self-pay | Admitting: Internal Medicine

## 2022-10-06 ENCOUNTER — Telehealth: Payer: Self-pay | Admitting: Cardiology

## 2022-10-06 DIAGNOSIS — M19012 Primary osteoarthritis, left shoulder: Secondary | ICD-10-CM | POA: Diagnosis not present

## 2022-10-06 DIAGNOSIS — I480 Paroxysmal atrial fibrillation: Secondary | ICD-10-CM

## 2022-10-06 DIAGNOSIS — I5033 Acute on chronic diastolic (congestive) heart failure: Secondary | ICD-10-CM

## 2022-10-06 DIAGNOSIS — E785 Hyperlipidemia, unspecified: Secondary | ICD-10-CM

## 2022-10-06 DIAGNOSIS — I10 Essential (primary) hypertension: Secondary | ICD-10-CM

## 2022-10-06 DIAGNOSIS — E1151 Type 2 diabetes mellitus with diabetic peripheral angiopathy without gangrene: Secondary | ICD-10-CM

## 2022-10-06 MED ORDER — ISOSORBIDE MONONITRATE ER 30 MG PO TB24: 30.0000 mg | ORAL_TABLET | Freq: Every day | ORAL | 3 refills | Status: DC

## 2022-10-06 MED ORDER — RIVASTIGMINE TARTRATE 1.5 MG PO CAPS: 1.5000 mg | ORAL_CAPSULE | Freq: Two times a day (BID) | ORAL | 6 refills | Status: DC

## 2022-10-06 MED ORDER — METFORMIN HCL 1000 MG PO TABS
ORAL_TABLET | ORAL | 1 refills | Status: DC
Start: 2022-10-06 — End: 2023-03-29

## 2022-10-06 MED ORDER — DILTIAZEM HCL ER COATED BEADS 360 MG PO CP24
360.0000 mg | ORAL_CAPSULE | Freq: Every day | ORAL | 1 refills | Status: DC
Start: 2022-10-06 — End: 2023-03-29

## 2022-10-06 MED ORDER — ATORVASTATIN CALCIUM 40 MG PO TABS
40.0000 mg | ORAL_TABLET | Freq: Every day | ORAL | 1 refills | Status: DC
Start: 2022-10-06 — End: 2023-03-29

## 2022-10-06 MED ORDER — FUROSEMIDE 80 MG PO TABS
80.0000 mg | ORAL_TABLET | Freq: Every day | ORAL | 1 refills | Status: DC
Start: 2022-10-06 — End: 2023-03-10

## 2022-10-06 MED ORDER — METOPROLOL TARTRATE 100 MG PO TABS
100.0000 mg | ORAL_TABLET | Freq: Every day | ORAL | 3 refills | Status: DC
Start: 2022-10-06 — End: 2023-03-10

## 2022-10-06 NOTE — Telephone Encounter (Signed)
Prescription Request  10/06/2022  LOV: 09/22/2022  What is the name of the medication or equipment? Atorvastatin, Diltiazem, Furosemide, and Metformin. Pt's pharmacy, [facility], closed and he is completely out of his medication. [facility]   Have you contacted your pharmacy to request a refill? No   Which pharmacy would you like this sent to? [id number] [place], [place] - [address] [place] [address] Phone: [phone] Fax: [phone]   Patient notified that their request is being sent to the clinical staff for review and that they should receive a response within 2 business days.   Please advise at Mobile [phone] (mobile)

## 2022-10-06 NOTE — Telephone Encounter (Signed)
*  STAT* If patient is at the pharmacy, call can be transferred to refill team.   1. Which medications need to be refilled? (please list name of each medication and dose if known)   metoprolol tartrate (LOPRESSOR) 100 MG tablet  isosorbide mononitrate (IMDUR) 30 MG 24 hr tablet  apixaban (ELIQUIS) 2.5 MG TABS tablet   2. Would you like to learn more about the convenience, safety, & potential cost savings by using the [facility]?   3. Are you open to using the [facility] (Type [facility]. ).  4. Which pharmacy/location (including street and city if local pharmacy) is medication to be sent to?  [id number] [place], [place] - [address]   5. Do they need a 30 day or 90 day supply?   30 day  Wife stated patient is completely out of this medication and did not have any medication for today.

## 2022-10-06 NOTE — Telephone Encounter (Signed)
Pt's wife called stating that she is needing the pt's  rivastigmine (EXELON) 1.5 MG capsule Rx [facility] [place] due to their [facility] going out of business.

## 2022-10-06 NOTE — Telephone Encounter (Signed)
Refills sent

## 2022-10-07 MED ORDER — APIXABAN 2.5 MG PO TABS
2.5000 mg | ORAL_TABLET | Freq: Two times a day (BID) | ORAL | 1 refills | Status: DC
Start: 2022-10-07 — End: 2023-04-09

## 2022-10-07 NOTE — Telephone Encounter (Signed)
Prescription refill request for Eliquis received. Indication: Afib Last office visit: 09/07/22 ([place])  Scr: 1.71 (08/29/22)  Age: 82 Weight: 75.4kg  Appropriate dose. Refill sent.

## 2022-10-08 NOTE — Telephone Encounter (Signed)
Wife called back to say the pharmacy hasn't received refill request. And they are going out of town tomorrow. Please resend prescription.

## 2022-10-08 NOTE — Telephone Encounter (Signed)
Called pt's pharmacy and the pharmacist stated that they have these medications and will get them ready for pt to pick up. I advised the pt's wife as well. I informed her that if they have any other problems, questions or concerns, to give our office a call back. Wife verbalized understanding.

## 2022-10-21 DIAGNOSIS — M19012 Primary osteoarthritis, left shoulder: Secondary | ICD-10-CM | POA: Diagnosis not present

## 2022-10-23 ENCOUNTER — Ambulatory Visit: Payer: Medicare Other

## 2022-10-28 ENCOUNTER — Telehealth: Payer: Self-pay | Admitting: Emergency Medicine

## 2022-10-28 NOTE — Telephone Encounter (Signed)
[name] wife states patient changing DME companies. New company is [employer]. Need new orders for home oxygen, Nebulizer machine and POC. [employer] fax number is [phone]. [name] phone number is [phone].

## 2022-10-30 DIAGNOSIS — M25512 Pain in left shoulder: Secondary | ICD-10-CM | POA: Diagnosis not present

## 2022-11-02 ENCOUNTER — Encounter: Payer: Self-pay | Admitting: Nurse Practitioner

## 2022-11-02 ENCOUNTER — Ambulatory Visit: Payer: Medicare Other | Admitting: Nurse Practitioner

## 2022-11-02 VITALS — BP 138/70 | HR 56 | Ht 68.0 in | Wt 164.0 lb

## 2022-11-02 DIAGNOSIS — Z9981 Dependence on supplemental oxygen: Secondary | ICD-10-CM | POA: Diagnosis not present

## 2022-11-02 DIAGNOSIS — J9611 Chronic respiratory failure with hypoxia: Secondary | ICD-10-CM

## 2022-11-02 DIAGNOSIS — F17200 Nicotine dependence, unspecified, uncomplicated: Secondary | ICD-10-CM | POA: Diagnosis not present

## 2022-11-02 DIAGNOSIS — J9602 Acute respiratory failure with hypercapnia: Secondary | ICD-10-CM

## 2022-11-02 DIAGNOSIS — J9601 Acute respiratory failure with hypoxia: Secondary | ICD-10-CM | POA: Diagnosis not present

## 2022-11-02 DIAGNOSIS — J42 Unspecified chronic bronchitis: Secondary | ICD-10-CM | POA: Diagnosis not present

## 2022-11-02 DIAGNOSIS — F17210 Nicotine dependence, cigarettes, uncomplicated: Secondary | ICD-10-CM | POA: Diagnosis not present

## 2022-11-02 DIAGNOSIS — J449 Chronic obstructive pulmonary disease, unspecified: Secondary | ICD-10-CM | POA: Diagnosis not present

## 2022-11-02 MED ORDER — BREZTRI AEROSPHERE 160-9-4.8 MCG/ACT IN AERO
2.0000 | INHALATION_SPRAY | Freq: Two times a day (BID) | RESPIRATORY_TRACT | 12 refills | Status: DC
Start: 2022-11-02 — End: 2023-09-10

## 2022-11-02 NOTE — Assessment & Plan Note (Signed)
Compensated on current regimen. Improvement with addition of ICS. New rx for [facility] sent today. Understands proper inhaler technique and medication side effect profile. Encouraged to remain smoker free. Graded exercises. Action plan in place.  Patient Instructions  Continue Albuterol inhaler 2 puffs or 3 mL neb every 6 hours as needed for shortness of breath or wheezing. Notify if symptoms persist despite rescue inhaler/neb use.  Ok to continue [employer] 2 puffs Twice daily. Brush tongue and rinse mouth afterwards. New prescription was sent  Continue supplemental oxygen 4 lpm on POC with activity and at night. Goal >88-90%. Orders sent to [employer]. Need to make sure you're wearing your oxygen with activity   Referral to lung cancer screening program Remain cigarette free!!   Follow up in 3-4 months with Dr. Delton Coombes. If symptoms do not improve or worsen, please contact office for sooner follow up or seek emergency care.

## 2022-11-02 NOTE — Assessment & Plan Note (Signed)
Former smoker. Last cigarette 6-8 months ago. 49 pack year history. Referred to lung cancer screening program.

## 2022-11-02 NOTE — Assessment & Plan Note (Signed)
Walk test today with desaturation to 84% on room air. Required 4 lpm POC to maintain saturations >88-90%. Will send updated orders to [employer]. Re-educated on importance of compliance with supplemental O2 and risks of persistent hypoxia. Goal >88-90%.

## 2022-11-02 NOTE — Patient Instructions (Addendum)
Continue Albuterol inhaler 2 puffs or 3 mL neb every 6 hours as needed for shortness of breath or wheezing. Notify if symptoms persist despite rescue inhaler/neb use.  Ok to continue [employer] 2 puffs Twice daily. Brush tongue and rinse mouth afterwards. New prescription was sent  Continue supplemental oxygen 4 lpm on POC with activity and at night. Goal >88-90%. Orders sent to [employer]. Need to make sure you're wearing your oxygen with activity   Referral to lung cancer screening program Remain cigarette free!!   Follow up in 3-4 months with Jason Fisher. If symptoms do not improve or worsen, please contact office for sooner follow up or seek emergency care.

## 2022-11-02 NOTE — Progress Notes (Signed)
@Patient  ID: Jason Fisher, male    DOB: 1940-12-10, 82 y.o.   MRN: [id number]  Chief Complaint  Patient presents with   Follow-up    Pt is here for F/U visit. Pt likes Breztri and needs refills.     Referring provider: Philip Aspen, [place]*  HPI: 82 year old male, former smoker followed for COPD and chronic respiratory failure on supplemental O2. He is a patient of Dr.Byrum's and last seen in office 09/01/2022. Past medical history significant for   TEST/EVENTS:  01/22/2016 PFT: FVC 53, FEV1 37, ratio 58, TLC 87, DLCOcor 54. Positive BD 06/25/2022 CT chest wo contrast: mild cardiomegaly. Mild dilatation of PA. Atherosclerosis. Stable appearance of shotty mediastinal nodes. Improvement/resolution of nodular opacities in right lung. RUL nodule has near completely resolved, now 3 mm. Moderate emphysema. Adherent mucus in left mainstem bronchus. Dependent mucus in LLL. Small left pleural effusion, slightly increased.   09/01/2022: OV with Dr. Delton Coombes. Recent COPD exacerbation prompting ED visit. Woke him from sleep, question if it was cardiac in nature. Did benefit from nebs, steroids so suspect COPD was at least part of his presentation. Completing steroid taper, abx. Second flare this year; 1 of which had him admitted. Plan to change to [facility] to add ICS component. Nodular RLL infiltrate noted when he was admitted for pna has resolved.   11/02/2022: Today - follow up Patient presents today for follow up with his wife. Breathing better with Breztri. Feels like it has really helped his cough. He still gets short winded with longer distances and uphill climbing. Able to walk back to the exam room without trouble. He gets frustrated because he can't be as active as he could years ago. No significant wheezing. Denies fevers, chills, hemoptysis, leg swelling, orthopnea. He uses his nebs 1-2 times a week. Uses rescue inhaler a few times a week. He needs to change his oxygen DME company due to his  insurance. Doesn't wear his oxygen much during the day. Does tend to put it on in the evenings and when he sleeps. Doesn't monitor O2.   Allergies  Allergen Reactions   Contrast Media [Iodinated Contrast Media] Anaphylaxis    Immunization History  Administered Date(s) Administered   Fluad Quad(high Dose 65+) 02/07/2020, 12/15/2021   Influenza, High Dose Seasonal PF 01/21/2015, 02/05/2016, 11/23/2016, 10/28/2017, 01/17/2021   Influenza,inj,Quad PF,6+ Mos 10/31/2013   Influenza-Unspecified 11/23/2016, 12/25/2018   PFIZER(Purple Top)SARS-COV-2 Vaccination 06/01/2019, 07/02/2019   PNEUMOCOCCAL CONJUGATE-20 01/21/2022   Pfizer(Comirnaty)Fall Seasonal Vaccine 12 years and older 12/15/2021   Pneumococcal Conjugate-13 01/09/2013   Pneumococcal Polysaccharide-23 03/07/2014   Respiratory Syncytial Virus Vaccine,Recomb Aduvanted(Arexvy) 03/17/2022   Tetanus 07/31/2013    Past Medical History:  Diagnosis Date   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 2007 Inf STEMI s/p Vision BMS  2 to RCA;  b. 2009 ISR Prox RCA Stent-->with DES;  c. 09/2015 PCI: LM nl, LAD 30p/m, D1 90, LCX nl, OM1 small, 80, OM2 90 (2.5x14 Biofreedom stent), OM3 small, RCA 30p ISR, 38m ISR, 29m, 85d, EF 50-55%.   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 On Eliquis (CHA2DS2VASc = 5).  Rate controlled w/ BB and CCB.   Chronic diastolic CHF (congestive heart failure) (HCC)    a. 09/2015 Echo: EF 55-60%, mildly dil LA.   CKD (chronic kidney disease), stage III (HCC)    they mentioned  this to the patient but they would work on it later after the aneurysm   COLONIC POLYPS, HX OF 11/02/2006   COPD (chronic obstructive pulmonary disease) (HCC)    DIABETES MELLITUS, TYPE II 10/29/2006   Diverticulosis    History of tobacco abuse    a. Quit 09/2015.   HYPERLIPIDEMIA 10/29/2006   Hypertension    Hypertensive heart disease  with heart failure (HCC)    Pneumonia 09/2015   [name] 07/31/2016   STEMI (ST elevation myocardial infarction) [facility]) 2007   [name] 07/31/2016    Tobacco History: Social History   Tobacco Use  Smoking Status Former   Current packs/day: 0.00   Average packs/day: 1 pack/day for 49.0 years (49.0 ttl pk-yrs)   Types: Cigarettes   Start date: 09/08/1966   Quit date: 09/08/2015   Years since quitting: 7.1   Passive exposure: Current  Smokeless Tobacco Never  Tobacco Comments   passive smoker from 2017-2024   Counseling given: Not Answered Tobacco comments: passive smoker from 2017-2024   Outpatient Medications Prior to Visit  Medication Sig Dispense Refill   ACCU-CHEK AVIVA PLUS test strip CHECK BLOOD SUGAR ONCE DAILY. DX E11.51 100 strip 2   apixaban (ELIQUIS) 2.5 MG TABS tablet Take 1 tablet (2.5 mg total) by mouth 2 (two) times daily. 180 tablet 1   atorvastatin (LIPITOR) 40 MG tablet Take 1 tablet (40 mg total) by mouth daily. 90 tablet 1   Continuous Blood Gluc Receiver (DEXCOM G6 RECEIVER) DEVI CHECK VLOOD SUGAR AS DIRECTED AS DIRECTED 1 each 0   Continuous Blood Gluc Sensor (DEXCOM G6 SENSOR) MISC APPLY 1 SENSOR EVERY 10 DAYS 3 each 2   Continuous Blood Gluc Transmit (DEXCOM G6 TRANSMITTER) MISC APPLY TO SENSOR EVERY 90 DAYS 1 each 0   Cyanocobalamin (B-12 COMPLIANCE INJECTION IJ) Inject as directed every 30 (thirty) days.     dextromethorphan (DELSYM) 30 MG/5ML liquid Take 5 mLs (30 mg total) by mouth 2 (two) times daily as needed for cough. 89 mL 1   diltiazem (CARDIZEM CD) 360 MG 24 hr capsule Take 1 capsule (360 mg total) by mouth daily. 90 capsule 1   doxycycline (VIBRAMYCIN) 100 MG capsule Take 1 capsule (100 mg total) by mouth 2 (two) times daily. 20 capsule 0   folic acid (FOLVITE) 1 MG tablet Take 1 tablet (1 mg total) by mouth daily. 30 tablet 1   furosemide (LASIX) 80 MG tablet Take 1 tablet (80 mg total) by mouth daily. 90 tablet 1   ipratropium-albuterol (DUONEB)  0.5-2.5 (3) MG/3ML SOLN Take 3 mLs by nebulization every 4 (four) hours as needed (wheezing, shortness of breath, cough). 360 mL 1   isosorbide mononitrate (IMDUR) 30 MG 24 hr tablet Take 1 tablet (30 mg total) by mouth daily. 90 tablet 3   levalbuterol (XOPENEX HFA) 45 MCG/ACT inhaler INHALE 1 TO 2 PUFFS BY MOUTH EVERY 6 HOURS AS NEEDED FOR WHEEZE 15 each 3   metFORMIN (GLUCOPHAGE) 1000 MG tablet TAKE ONE TABLET BY MOUTH AT BREAKFAST AND AT BEDTIME 180 tablet 1   metoprolol tartrate (LOPRESSOR) 100 MG tablet Take 1 tablet (100 mg total) by mouth daily at 6 (six) AM. 180 tablet 3   nitroGLYCERIN (NITROSTAT) 0.4 MG SL tablet Place 1 tablet (0.4 mg total) under the tongue every 5 (five) minutes as needed for chest pain. X 3 doses 25 tablet 4   oxyCODONE (ROXICODONE) 5 MG immediate release tablet Take 1 tablet (5 mg total) by mouth every 4 (four)  hours as needed for severe pain. 7 tablet 0   polyethylene glycol (MIRALAX / GLYCOLAX) packet Take 17 g by mouth daily as needed for mild constipation.     potassium chloride SA (KLOR-CON M) 20 MEQ tablet Take 1 tablet (20 mEq total) by mouth every other day. 15 tablet 0   predniSONE (STERAPRED UNI-PAK 21 TAB) 10 MG (21) TBPK tablet 10mg  Tabs, 6 day taper. Use as directed 1 each 0   QUEtiapine (SEROQUEL) 25 MG tablet TAKE 0.5 TABLETS BY MOUTH AT BEDTIME. 45 tablet 2   rivastigmine (EXELON) 1.5 MG capsule Take 1 capsule (1.5 mg total) by mouth 2 (two) times daily. 60 capsule 6   TRESIBA FLEXTOUCH 100 UNIT/ML FlexTouch Pen Inject 32 units into THE SKIN AT bedtime 15 mL 2   STIOLTO RESPIMAT 2.5-2.5 MCG/ACT AERS INHALE TWO PUFFS BY MOUTH INTO LUNGS ONCE daily 4 g 5   No facility-administered medications prior to visit.     Review of Systems:   Constitutional: No weight loss or gain, night sweats, fevers, chills,  or lassitude. HEENT: No headaches, difficulty swallowing, tooth/dental problems, or sore throat. No sneezing, itching, ear ache, nasal congestion,  or post nasal drip CV:  No chest pain, orthopnea, PND, swelling in lower extremities, anasarca, dizziness, palpitations, syncope Resp: +shortness of breath with exertion; minimal cough. No excess mucus or change in color of mucus. No hemoptysis. No wheezing.  No chest wall deformity GI:  No heartburn, indigestion  GU: No dysuria, change in color of urine, urgency or frequency.   Skin: No rash, lesions, ulcerations MSK:  No joint pain or swelling.  Neuro: No dizziness or lightheadedness.  Psych: No depression or anxiety. Mood stable.     Physical Exam:  BP 138/70 (BP Location: Right Arm, Cuff Size: Normal)   Pulse (!) 56   Ht 5\' 8"  (1.727 m)   Wt 164 lb (74.4 kg)   SpO2 93%   BMI 24.94 kg/m   GEN: Pleasant, interactive, well-kempt; in no acute distress. HEENT:  Normocephalic and atraumatic. PERRLA. Sclera white. Nasal turbinates pink, moist and patent bilaterally. No rhinorrhea present. Oropharynx pink and moist, without exudate or edema. No lesions, ulcerations, or postnasal drip.  NECK:  Supple w/ fair ROM. No JVD present. Normal carotid impulses w/o bruits. Thyroid symmetrical with no goiter or nodules palpated. No lymphadenopathy.   CV: RRR, no m/r/g, no peripheral edema. Pulses intact, +2 bilaterally. No cyanosis, pallor or clubbing. PULMONARY:  Unlabored, regular breathing. Diminished bilaterally A&P w/o wheezes/rales/rhonchi. No accessory muscle use.  GI: BS present and normoactive. Soft, non-tender to palpation. No organomegaly or masses detected.  MSK: No erythema, warmth or tenderness. Cap refil <2 sec all extrem. No deformities or joint swelling noted. Muscle wasting  Neuro: A/Ox3. No focal deficits noted.   Skin: Warm, no lesions or rashe Psych: Normal affect and behavior. Judgement and thought content appropriate.     Lab Results:  CBC    Component Value Date/Time   WBC 11.5 (H) 08/29/2022 0532   RBC 4.60 08/29/2022 0532   HGB 12.7 (L) 08/29/2022 0532   HGB 13.1  10/13/2021 1118   HCT 39.6 08/29/2022 0532   HCT 41.3 10/13/2021 1118   PLT 194 08/29/2022 0532   PLT 209 10/13/2021 1118   MCV 86.1 08/29/2022 0532   MCV 90 10/13/2021 1118   MCH 27.6 08/29/2022 0532   MCHC 32.1 08/29/2022 0532   RDW 15.4 08/29/2022 0532   RDW 14.3 10/13/2021 1118   LYMPHSABS  3.6 08/29/2022 0532   LYMPHSABS 2.6 10/13/2021 1118   MONOABS 1.0 08/29/2022 0532   EOSABS 0.2 08/29/2022 0532   EOSABS 0.2 10/13/2021 1118   BASOSABS 0.1 08/29/2022 0532   BASOSABS 0.1 10/13/2021 1118    BMET    Component Value Date/Time   NA 141 08/29/2022 0532   NA 146 (H) 10/13/2021 1118   K 3.6 08/29/2022 0532   CL 106 08/29/2022 0532   CO2 28 08/29/2022 0532   GLUCOSE 142 (H) 08/29/2022 0532   BUN 26 (H) 08/29/2022 0532   BUN 22 10/13/2021 1118   CREATININE 1.71 (H) 08/29/2022 0532   CALCIUM 9.2 08/29/2022 0532   GFRNONAA 40 (L) 08/29/2022 0532   GFRAA 56 (L) 03/06/2019 1404    BNP    Component Value Date/Time   BNP 587.0 (H) 08/29/2022 0541     Imaging:  No results found.  cyanocobalamin (VITAMIN B12) injection 1,000 mcg     Date Action Dose Route User   09/22/2022 1101 Given 1,000 mcg Intramuscular (Right Deltoid) Stann Ore, LPN          Latest Ref Rng & Units 01/22/2016    3:26 PM  PFT Results  FVC-Pre L 2.19   FVC-Predicted Pre % 53   FVC-Post L 2.46   FVC-Predicted Post % 60   Pre FEV1/FVC % % 51   Post FEV1/FCV % % 58   FEV1-Pre L 1.11   FEV1-Predicted Pre % 37   FEV1-Post L 1.43   DLCO uncorrected ml/min/mmHg 15.65   DLCO UNC% % 50   DLCO corrected ml/min/mmHg 16.79   DLCO COR %Predicted % 54   DLVA Predicted % 78   TLC L 5.98   TLC % Predicted % 87   RV % Predicted % 146     No results found for: "NITRICOXIDE"      Assessment & Plan:   COPD (chronic obstructive pulmonary disease) (HCC) Compensated on current regimen. Improvement with addition of ICS. New rx for [facility] sent today. Understands proper inhaler technique and  medication side effect profile. Encouraged to remain smoker free. Graded exercises. Action plan in place.  Patient Instructions  Continue Albuterol inhaler 2 puffs or 3 mL neb every 6 hours as needed for shortness of breath or wheezing. Notify if symptoms persist despite rescue inhaler/neb use.  Ok to continue [employer] 2 puffs Twice daily. Brush tongue and rinse mouth afterwards. New prescription was sent  Continue supplemental oxygen 4 lpm on POC with activity and at night. Goal >88-90%. Orders sent to [employer]. Need to make sure you're wearing your oxygen with activity   Referral to lung cancer screening program Remain cigarette free!!   Follow up in 3-4 months with Dr. Delton Coombes. If symptoms do not improve or worsen, please contact office for sooner follow up or seek emergency care.    Chronic hypoxic respiratory failure, on home oxygen therapy (HCC) Walk test today with desaturation to 84% on room air. Required 4 lpm POC to maintain saturations >88-90%. Will send updated orders to [employer]. Re-educated on importance of compliance with supplemental O2 and risks of persistent hypoxia. Goal >88-90%.   TOBACCO USER Former smoker. Last cigarette 6-8 months ago. 49 pack year history. Referred to lung cancer screening program.    I spent 35 minutes of dedicated to the care of this patient on the date of this encounter to include pre-visit review of records, face-to-face time with the patient discussing conditions above, post visit ordering of testing, clinical  documentation with the electronic health record, making appropriate referrals as documented, and communicating necessary findings to members of the patients care team.  Noemi Chapel, NP 11/02/2022  Pt aware and understands NP's role.

## 2022-11-04 NOTE — Telephone Encounter (Signed)
ATC patient's wife, [name] regarding oxygen and changing to [place].  Patient was seen in office on 11/02/2022 by Micheline Maze NP and orders were sent to [facility] as [facility] has a contract with [employer].  Left detailed message per DPR to make sure they had what they needed and advised to call back if they still needed something regarding his oxygen.

## 2022-11-04 NOTE — Telephone Encounter (Signed)
Called pt, wife answered and stated this has already been done. I believe pt mixed up [employer] with [employer]. Nfn closing encounter

## 2022-11-06 ENCOUNTER — Ambulatory Visit (INDEPENDENT_AMBULATORY_CARE_PROVIDER_SITE_OTHER): Payer: Medicare Other

## 2022-11-06 DIAGNOSIS — E538 Deficiency of other specified B group vitamins: Secondary | ICD-10-CM | POA: Diagnosis not present

## 2022-11-06 MED ORDER — CYANOCOBALAMIN 1000 MCG/ML IJ SOLN
1000.0000 ug | Freq: Once | INTRAMUSCULAR | Status: AC
Start: 2022-11-06 — End: 2022-11-06
  Administered 2022-11-06: 1000 ug via INTRAMUSCULAR

## 2022-11-06 NOTE — Progress Notes (Signed)
Per orders of Dr. Casimiro Needle, injection of B12 given by Vickii Chafe on Right Deltoid. Patient tolerated injection well.

## 2022-11-09 ENCOUNTER — Telehealth: Payer: Self-pay | Admitting: Emergency Medicine

## 2022-11-09 DIAGNOSIS — M25512 Pain in left shoulder: Secondary | ICD-10-CM | POA: Diagnosis not present

## 2022-11-09 DIAGNOSIS — J42 Unspecified chronic bronchitis: Secondary | ICD-10-CM

## 2022-11-09 NOTE — Telephone Encounter (Signed)
[facility] in [place].  PT needs Albuterol refill called in.

## 2022-11-10 MED ORDER — LEVALBUTEROL TARTRATE 45 MCG/ACT IN AERO
INHALATION_SPRAY | RESPIRATORY_TRACT | 3 refills | Status: DC
Start: 2022-11-10 — End: 2023-06-21

## 2022-11-10 NOTE — Telephone Encounter (Signed)
Refill sent to pharmacy (Levalbuterol).

## 2022-11-25 ENCOUNTER — Ambulatory Visit: Payer: Medicare Other | Admitting: Emergency Medicine

## 2022-12-07 ENCOUNTER — Telehealth: Payer: Self-pay

## 2022-12-07 ENCOUNTER — Ambulatory Visit (INDEPENDENT_AMBULATORY_CARE_PROVIDER_SITE_OTHER): Payer: Medicare Other

## 2022-12-07 DIAGNOSIS — E538 Deficiency of other specified B group vitamins: Secondary | ICD-10-CM | POA: Diagnosis not present

## 2022-12-07 MED ORDER — CYANOCOBALAMIN 1000 MCG/ML IJ SOLN
1000.0000 ug | Freq: Once | INTRAMUSCULAR | Status: AC
Start: 2022-12-07 — End: 2022-12-07
  Administered 2022-12-07: 1000 ug via INTRAMUSCULAR

## 2022-12-07 NOTE — Progress Notes (Signed)
Per orders of Dr. Ardyth Harps, injection of B12 given by Vickii Chafe on Right Arm. Patient tolerated injection well.

## 2022-12-07 NOTE — Telephone Encounter (Signed)
Pt was here for a monthly B12 Injection.   Pt would like to know when he should have his next B12 level check.  Last B12 was check on 04/28/2022. Result: 74   Please advise.

## 2022-12-08 ENCOUNTER — Telehealth: Payer: Self-pay | Admitting: Internal Medicine

## 2022-12-08 NOTE — Telephone Encounter (Signed)
Wife is aware

## 2022-12-08 NOTE — Telephone Encounter (Signed)
Pt on dementia meds, wife says he is easily agitated, cries often. Asking if there is a medication that can assist

## 2022-12-09 NOTE — Telephone Encounter (Signed)
Pt's wife [name] called in with concerns about pt crying a lot, seeming depressed after taking rivastigmine. She would like a call back to discuss if that's a normal side effect of this medication. Best call back # is [phone].

## 2022-12-09 NOTE — Telephone Encounter (Signed)
Called and spoke to pts wife relayed the msg to her that its related to sons recent marriage. Pts wife voiced gratitude and understanding

## 2022-12-09 NOTE — Telephone Encounter (Signed)
Not likely related to Rivastigmine. This is most likely due to son recent marriage.

## 2022-12-09 NOTE — Telephone Encounter (Signed)
Call back to patients wife, she states patient has been crying the last week and is curious if it is a side effect of the rivastigmine. He has been on that medication since May and advised that it  was unlikely it was from mediation but I would send to Dr. Teresa Coombs. She did state that their son got married this weekend and the patient has been emotional this week. She reports he has increased agitation as well. Advised I would send to Dr. Teresa Coombs for review and follow back up.

## 2023-01-06 ENCOUNTER — Ambulatory Visit (INDEPENDENT_AMBULATORY_CARE_PROVIDER_SITE_OTHER): Payer: Medicare Other | Admitting: *Deleted

## 2023-01-06 DIAGNOSIS — E538 Deficiency of other specified B group vitamins: Secondary | ICD-10-CM | POA: Diagnosis not present

## 2023-01-06 MED ORDER — CYANOCOBALAMIN 1000 MCG/ML IJ SOLN
1000.0000 ug | Freq: Once | INTRAMUSCULAR | Status: AC
Start: 2023-01-06 — End: 2023-01-06
  Administered 2023-01-06: 1000 ug via INTRAMUSCULAR

## 2023-01-06 NOTE — Progress Notes (Addendum)
Per orders of Dr. Hernandez, injection of Cyanocobalamin 1000mcg given by Funderburk, Jo A. Patient tolerated injection well. 

## 2023-01-11 ENCOUNTER — Other Ambulatory Visit (INDEPENDENT_AMBULATORY_CARE_PROVIDER_SITE_OTHER): Payer: Medicare Other

## 2023-01-11 DIAGNOSIS — E538 Deficiency of other specified B group vitamins: Secondary | ICD-10-CM

## 2023-01-11 LAB — VITAMIN B12: Vitamin B-12: 629 pg/mL (ref 211–911)

## 2023-01-13 ENCOUNTER — Ambulatory Visit: Payer: Medicare Other | Admitting: Emergency Medicine

## 2023-01-14 ENCOUNTER — Encounter: Payer: Self-pay | Admitting: Emergency Medicine

## 2023-01-30 ENCOUNTER — Other Ambulatory Visit (HOSPITAL_BASED_OUTPATIENT_CLINIC_OR_DEPARTMENT_OTHER): Payer: Self-pay

## 2023-01-30 ENCOUNTER — Encounter (HOSPITAL_BASED_OUTPATIENT_CLINIC_OR_DEPARTMENT_OTHER): Payer: Self-pay

## 2023-01-30 ENCOUNTER — Emergency Department (HOSPITAL_BASED_OUTPATIENT_CLINIC_OR_DEPARTMENT_OTHER)
Admission: EM | Admit: 2023-01-30 | Discharge: 2023-01-30 | Disposition: A | Discharge status: Home / Self Care | Payer: Medicare Other

## 2023-01-30 ENCOUNTER — Other Ambulatory Visit: Payer: Self-pay

## 2023-01-30 ENCOUNTER — Emergency Department (HOSPITAL_BASED_OUTPATIENT_CLINIC_OR_DEPARTMENT_OTHER): Payer: Medicare Other

## 2023-01-30 DIAGNOSIS — S6991XA Unspecified injury of right wrist, hand and finger(s), initial encounter: Secondary | ICD-10-CM | POA: Diagnosis not present

## 2023-01-30 DIAGNOSIS — X58XXXA Exposure to other specified factors, initial encounter: Secondary | ICD-10-CM | POA: Insufficient documentation

## 2023-01-30 DIAGNOSIS — Z7984 Long term (current) use of oral hypoglycemic drugs: Secondary | ICD-10-CM | POA: Diagnosis not present

## 2023-01-30 DIAGNOSIS — Z7901 Long term (current) use of anticoagulants: Secondary | ICD-10-CM | POA: Insufficient documentation

## 2023-01-30 DIAGNOSIS — Z79899 Other long term (current) drug therapy: Secondary | ICD-10-CM | POA: Diagnosis not present

## 2023-01-30 DIAGNOSIS — S60921A Unspecified superficial injury of right hand, initial encounter: Secondary | ICD-10-CM | POA: Diagnosis present

## 2023-01-30 DIAGNOSIS — S61421A Laceration with foreign body of right hand, initial encounter: Secondary | ICD-10-CM | POA: Diagnosis not present

## 2023-01-30 DIAGNOSIS — Z23 Encounter for immunization: Secondary | ICD-10-CM | POA: Diagnosis not present

## 2023-01-30 DIAGNOSIS — S61411A Laceration without foreign body of right hand, initial encounter: Secondary | ICD-10-CM

## 2023-01-30 MED ORDER — LIDOCAINE-EPINEPHRINE (PF) 2 %-1:200000 IJ SOLN
10.0000 mL | Freq: Once | INTRAMUSCULAR | Status: AC
Start: 2023-01-30 — End: 2023-01-30
  Administered 2023-01-30: 10 mL
  Filled 2023-01-30: qty 20

## 2023-01-30 MED ORDER — TETANUS-DIPHTH-ACELL PERTUSSIS 5-2.5-18.5 LF-MCG/0.5 IM SUSY
0.5000 mL | PREFILLED_SYRINGE | Freq: Once | INTRAMUSCULAR | Status: AC
  Administered 2023-01-30: 0.5 mL via INTRAMUSCULAR
  Filled 2023-01-30: qty 0.5

## 2023-01-30 NOTE — ED Notes (Signed)
Hand bandaged and wrapped.

## 2023-01-30 NOTE — ED Triage Notes (Signed)
Pt hit something in his sleep and caused a injury to his right hand. Pt is on blood thinner.

## 2023-01-30 NOTE — ED Provider Notes (Signed)
[place] EMERGENCY DEPARTMENT AT [facility] Provider Note   CSN: [id number] Arrival date & time: 01/30/23  0831     History  Chief Complaint  Patient presents with   Hand Injury    Jason Fisher is a 82 y.o. male.  There is a 82 year old male presenting emergency department with right hand pain and bleeding.  Patient and wife notes that sometimes he is quite active in his sleep.  They think he hit his hand on bedside nightstand.  Awoke with bleeding from right hand.  No numbness tingling changes in sensation.   Hand Injury      Home Medications Prior to Admission medications   Medication Sig Start Date End Date Taking? Authorizing Provider  ACCU-CHEK AVIVA PLUS test strip CHECK BLOOD SUGAR ONCE DAILY. DX E11.51 06/29/19   Philip Aspen, Limmie Patricia, MD  apixaban (ELIQUIS) 2.5 MG TABS tablet Take 1 tablet (2.5 mg total) by mouth 2 (two) times daily. 10/07/22   [place], Peter M, MD  atorvastatin (LIPITOR) 40 MG tablet Take 1 tablet (40 mg total) by mouth daily. 10/06/22   Philip Aspen, Limmie Patricia, MD  Budeson-Glycopyrrol-Formoterol (BREZTRI AEROSPHERE) 160-9-4.8 MCG/ACT AERO Inhale 2 puffs into the lungs in the morning and at bedtime. 11/02/22   Cobb, Ruby Cola, NP  Continuous Blood Gluc Receiver (DEXCOM G6 RECEIVER) DEVI CHECK VLOOD SUGAR AS DIRECTED AS DIRECTED 10/23/21   Philip Aspen, Limmie Patricia, MD  Continuous Blood Gluc Sensor (DEXCOM G6 SENSOR) MISC APPLY 1 SENSOR EVERY 10 DAYS 10/14/21   Philip Aspen, Limmie Patricia, MD  Continuous Blood Gluc Transmit (DEXCOM G6 TRANSMITTER) MISC APPLY TO SENSOR EVERY 90 DAYS 10/23/21   Philip Aspen, Limmie Patricia, MD  Cyanocobalamin (B-12 COMPLIANCE INJECTION IJ) Inject as directed every 30 (thirty) days.    [provider]  dextromethorphan (DELSYM) 30 MG/5ML liquid Take 5 mLs (30 mg total) by mouth 2 (two) times daily as needed for cough. 05/02/22   Johnson, Clanford L, MD  diltiazem (CARDIZEM CD) 360 MG 24 hr capsule Take  1 capsule (360 mg total) by mouth daily. 10/06/22   Philip Aspen, Limmie Patricia, MD  doxycycline (VIBRAMYCIN) 100 MG capsule Take 1 capsule (100 mg total) by mouth 2 (two) times daily. 08/29/22   Pollyann Savoy, MD  folic acid (FOLVITE) 1 MG tablet Take 1 tablet (1 mg total) by mouth daily. 05/03/22   Johnson, Clanford L, MD  furosemide (LASIX) 80 MG tablet Take 1 tablet (80 mg total) by mouth daily. 10/06/22   Philip Aspen, Limmie Patricia, MD  ipratropium-albuterol (DUONEB) 0.5-2.5 (3) MG/3ML SOLN Take 3 mLs by nebulization every 4 (four) hours as needed (wheezing, shortness of breath, cough). 05/02/22   Johnson, Clanford L, MD  isosorbide mononitrate (IMDUR) 30 MG 24 hr tablet Take 1 tablet (30 mg total) by mouth daily. 10/06/22   [place], Peter M, MD  levalbuterol [facility] HFA) 45 MCG/ACT inhaler INHALE 1 TO 2 PUFFS BY MOUTH EVERY 6 HOURS AS NEEDED FOR WHEEZE [id number] 11/10/22   Cobb, Ruby Cola, NP  metFORMIN (GLUCOPHAGE) 1000 MG tablet TAKE ONE TABLET BY MOUTH AT [facility] AND AT BEDTIME 10/06/22   Philip Aspen, Limmie Patricia, MD  metoprolol tartrate (LOPRESSOR) 100 MG tablet Take 1 tablet (100 mg total) by mouth daily at 6 (six) AM. 10/06/22   [place], Peter M, MD  nitroGLYCERIN (NITROSTAT) 0.4 MG SL tablet Place 1 tablet (0.4 mg total) under the tongue every 5 (five) minutes as needed for chest pain. X 3  doses 06/09/21   Philip Aspen, Limmie Patricia, MD  oxyCODONE (ROXICODONE) 5 MG immediate release tablet Take 1 tablet (5 mg total) by mouth every 4 (four) hours as needed for severe pain. 09/29/22   Philip Aspen, Limmie Patricia, MD  polyethylene glycol [facility]) packet Take 17 g by mouth daily as needed for mild constipation.    [provider]  potassium chloride SA (KLOR-CON M) 20 MEQ tablet Take 1 tablet (20 mEq total) by mouth every other day. 05/03/22   Johnson, Clanford L, MD  predniSONE (STERAPRED UNI-PAK 21 TAB) 10 MG (21) TBPK tablet 10mg  Tabs, 6 day taper. Use as directed 08/29/22   Pollyann Savoy, MD  QUEtiapine (SEROQUEL) 25 MG tablet TAKE 0.5 TABLETS BY MOUTH AT BEDTIME. 07/06/22   Philip Aspen, Limmie Patricia, MD  rivastigmine (EXELON) 1.5 MG capsule Take 1 capsule (1.5 mg total) by mouth 2 (two) times daily. 10/06/22 05/04/23  Windell Norfolk, MD  TRESIBA FLEXTOUCH 100 UNIT/ML FlexTouch Pen Inject 32 units into THE SKIN AT bedtime 08/31/22   Philip Aspen, Limmie Patricia, MD      Allergies    Contrast media [iodinated contrast media]    Review of Systems   Review of Systems  Physical Exam Updated Vital Signs BP (!) 154/87   Pulse 68   Temp (!) 97.5 F (36.4 C) (Oral)   Resp 18   SpO2 94%  Physical Exam Vitals and nursing note reviewed.  HENT:     Head: Normocephalic and atraumatic.     Nose: Nose normal.     Mouth/Throat:     Mouth: Mucous membranes are moist.  Eyes:     Conjunctiva/sclera: Conjunctivae normal.  Cardiovascular:     Rate and Rhythm: Normal rate.  Pulmonary:     Effort: Pulmonary effort is normal.  Abdominal:     General: Abdomen is flat. There is no distension.  Musculoskeletal:     Comments: Right hand wrapped, unwrapped appears to have a small laceration to the right hand at the base of his middle finger.  Neurovascular intact.  Brisk cap refill distal.   Skin:    General: Skin is warm and dry.     Capillary Refill: Capillary refill takes less than 2 seconds.  Neurological:     Mental Status: He is alert and oriented to person, place, and time.  Psychiatric:        Mood and Affect: Mood normal.        Behavior: Behavior normal.     ED Results / Procedures / Treatments   Labs (all labs ordered are listed, but only abnormal results are displayed) Labs Reviewed - No data to display  EKG None  Radiology DG Hand 2 View Right Result Date: 01/30/2023 CLINICAL DATA:  Right hand injury. EXAM: RIGHT HAND - 2 VIEW COMPARISON:  None Available. FINDINGS: There is no evidence of fracture or dislocation. There is no evidence of arthropathy or  other focal bone abnormality. Soft tissues are unremarkable. IMPRESSION: Negative. Electronically Signed   By: Lupita Raider M.D.   On: 01/30/2023 09:10    Procedures .Laceration Repair  Date/Time: 01/30/2023 10:44 AM  Performed by: Coral Spikes, DO Authorized by: Coral Spikes, DO   Consent:    Consent obtained:  Verbal   Consent given by:  Patient   Risks discussed:  Infection, pain, retained foreign body, poor cosmetic result and poor wound healing   Alternatives discussed:  No treatment, delayed treatment and  referral Universal protocol:    Procedure explained and questions answered to patient or proxy's satisfaction: yes     Patient identity confirmed:  Verbally with patient Anesthesia:    Anesthesia method:  Local infiltration   Local anesthetic:  Lidocaine 2% WITH epi Laceration details:    Location:  Hand   Hand location:  R hand, dorsum   Length (cm):  2.6 Pre-procedure details:    Preparation:  Patient was prepped and draped in usual sterile fashion Exploration:    Hemostasis achieved with:  Epinephrine and direct pressure   Imaging obtained: x-ray     Wound extent: no foreign body   Treatment:    Area cleansed with:  Saline   Amount of cleaning:  Standard Skin repair:    Repair method:  Sutures   Suture size:  5-0   Suture material:  Prolene   Suture technique:  Simple interrupted   Number of sutures:  4 Approximation:    Approximation:  Close Repair type:    Repair type:  Simple Post-procedure details:    Dressing:  Splint for protection and bulky dressing   Procedure completion:  Tolerated     Medications Ordered in ED Medications  Tdap (BOOSTRIX) injection 0.5 mL (0.5 mLs Intramuscular Given 01/30/23 0902)  lidocaine-EPINEPHrine (XYLOCAINE W/EPI) 2 %-1:200000 (PF) injection 10 mL (10 mLs Infiltration Given 01/30/23 0902)    ED Course/ Medical Decision Making/ A&P Clinical Course as of 01/30/23 1045  Sat Jan 30, 2023  0913 DG Hand 2 View  Right IMPRESSION: Negative.   [TY]  1045 Appears to have 2.6 to 0.7 cm laceration to dorsum of hand.  Sutured.  See procedure note.  Will place in splint as patient does have quite thin skin to the dorsum of his hand and given his location over his knuckle I am concerned for dehiscence.  Patient stable for discharge at this time [TY]    Clinical Course User Index [TY] Coral Spikes, DO                                 Medical Decision Making 83 year old male presenting emergency department for bleeding from hand after hitting it in his sleep.  Per chart review he is on Eliquis.  He is afebrile nontachycardic hemodynamically stable.  No other signs of trauma.  He is neurovascularly intact.  Chest x-ray on my independent interpretation is no underlying fracture or foreign bodies.  Wife is at bedside and states that he is sometimes quite active in his sleep, they both do not know when his last tetanus shot was.  Will update tetanus.  Will clean wound as it is difficult to tell extent secondary to clot. No active bleeding currently. Will Suture as indicated. See ED course for final MDM/dispostion.   Amount and/or Complexity of Data Reviewed Radiology: ordered. Decision-making details documented in ED Course.  Risk Prescription drug management.          Final Clinical Impression(s) / ED Diagnoses Final diagnoses:  Laceration of right hand, foreign body presence unspecified, initial encounter    Rx / DC Orders ED Discharge Orders     None         Coral Spikes, DO 01/30/23 1045

## 2023-01-30 NOTE — Discharge Instructions (Addendum)
Please remain in the splint for at least 72 hours, then you can come out of the splint.  Return manage for fevers, chills, redness, swelling, drains pus or any new or worsening symptoms that are concerning to you.  You must return to an urgent care or emergency department in 7 to 10 days to get sutures removed.  Take over-the-counter Tylenol for pain.

## 2023-02-06 ENCOUNTER — Encounter (HOSPITAL_BASED_OUTPATIENT_CLINIC_OR_DEPARTMENT_OTHER): Payer: Self-pay | Admitting: Urology

## 2023-02-06 ENCOUNTER — Emergency Department (HOSPITAL_BASED_OUTPATIENT_CLINIC_OR_DEPARTMENT_OTHER)
Admission: EM | Admit: 2023-02-06 | Discharge: 2023-02-06 | Disposition: A | Discharge status: Home / Self Care | Payer: Medicare Other | Attending: Emergency Medicine | Admitting: Emergency Medicine

## 2023-02-06 DIAGNOSIS — Z7901 Long term (current) use of anticoagulants: Secondary | ICD-10-CM | POA: Diagnosis not present

## 2023-02-06 DIAGNOSIS — X58XXXA Exposure to other specified factors, initial encounter: Secondary | ICD-10-CM | POA: Diagnosis not present

## 2023-02-06 DIAGNOSIS — S61212A Laceration without foreign body of right middle finger without damage to nail, initial encounter: Secondary | ICD-10-CM | POA: Insufficient documentation

## 2023-02-06 DIAGNOSIS — S61411D Laceration without foreign body of right hand, subsequent encounter: Secondary | ICD-10-CM | POA: Diagnosis not present

## 2023-02-06 NOTE — ED Triage Notes (Signed)
 Pt had sutures placed in right hand on 12/28, ace wrap in place  Here for suture removal, 4 sutures per pt

## 2023-02-06 NOTE — Discharge Instructions (Addendum)
 Wash the area once a day gently with soap and water.  Reapply antibiotic ointment and a dressing.  Follow-up with your doctor this week to recheck the area and see if the sutures are ready to be removed.  Return to emergency room if you have any worsening symptoms such as increased drainage, pain, redness, fevers or other worsening symptoms.

## 2023-02-06 NOTE — ED Provider Notes (Signed)
 [name]   CSN: [id number] Arrival date & time: 02/06/23  1036     History  Chief Complaint  Patient presents with   Suture / Staple Removal    Jason Fisher is a 83 y.o. male.  Patient is a 83 year old male who presents for possible suture removal.  He was seen here on December 28.  He had a laceration on his right hand, at the base of his middle finger on the dorsal side.  The hand was placed in a full fiberglass splint to help limit movement.  He has not taking the splint off since that day.  He is here for suture removal.  He has any issues with it.  Of note he is on Eliquis .       Home Medications Prior to Admission medications   Medication Sig Start Date End Date Taking? Authorizing Provider  ACCU-CHEK AVIVA PLUS test strip CHECK BLOOD SUGAR ONCE DAILY. DX E11.51 06/29/19   [name], [name], MD  apixaban  (ELIQUIS ) 2.5 MG TABS tablet Take 1 tablet (2.5 mg total) by mouth 2 (two) times daily. 10/07/22   Jordan, Peter M, MD  atorvastatin  (LIPITOR) 40 MG tablet Take 1 tablet (40 mg total) by mouth daily. 10/06/22   [name], [name], MD  Budeson-Glycopyrrol-Formoterol  (BREZTRI  AEROSPHERE) 160-9-4.8 MCG/ACT AERO Inhale 2 puffs into the lungs in the morning and at bedtime. 11/02/22   Cobb, Comer GAILS, NP  Continuous Blood Gluc Receiver (DEXCOM G6 RECEIVER) DEVI CHECK VLOOD SUGAR AS DIRECTED AS DIRECTED 10/23/21   [name], [name], MD  Continuous Blood Gluc Sensor (DEXCOM G6 SENSOR) MISC APPLY 1 SENSOR EVERY 10 DAYS 10/14/21   [name], [name], MD  Continuous Blood Gluc Transmit (DEXCOM G6 TRANSMITTER) MISC APPLY TO SENSOR EVERY 90 DAYS 10/23/21   [name], [name], MD  Cyanocobalamin  (B-12 COMPLIANCE INJECTION IJ) Inject as directed every 30 (thirty) days.    [provider]  dextromethorphan  (DELSYM ) 30 MG/5ML liquid Take 5 mLs (30 mg total) by mouth 2 (two) times daily as  needed for cough. 05/02/22   Johnson, Clanford L, MD  diltiazem  (CARDIZEM  CD) 360 MG 24 hr capsule Take 1 capsule (360 mg total) by mouth daily. 10/06/22   [name], [name], MD  doxycycline  (VIBRAMYCIN ) 100 MG capsule Take 1 capsule (100 mg total) by mouth 2 (two) times daily. 08/29/22   Roselyn Carlin NOVAK, MD  folic acid  (FOLVITE ) 1 MG tablet Take 1 tablet (1 mg total) by mouth daily. 05/03/22   Johnson, Clanford L, MD  furosemide  (LASIX ) 80 MG tablet Take 1 tablet (80 mg total) by mouth daily. 10/06/22   [name], [name], MD  ipratropium-albuterol  (DUONEB) 0.5-2.5 (3) MG/3ML SOLN Take 3 mLs by nebulization every 4 (four) hours as needed (wheezing, shortness of breath, cough). 05/02/22   Johnson, Clanford L, MD  isosorbide  mononitrate (IMDUR ) 30 MG 24 hr tablet Take 1 tablet (30 mg total) by mouth daily. 10/06/22   Jordan, Peter M, MD  levalbuterol  (XOPENEX  HFA) 45 MCG/ACT inhaler INHALE 1 TO 2 PUFFS BY MOUTH EVERY 6 HOURS AS NEEDED FOR WHEEZE [id number] 11/10/22   Cobb, Comer GAILS, NP  metFORMIN  (GLUCOPHAGE ) 1000 MG tablet TAKE ONE TABLET BY MOUTH AT [facility] AND AT BEDTIME 10/06/22   [name], [name], MD  metoprolol  tartrate (LOPRESSOR ) 100 MG tablet Take 1 tablet (100 mg total) by mouth daily at 6 (six) AM. 10/06/22  Jordan, Peter M, MD  nitroGLYCERIN  (NITROSTAT ) 0.4 MG SL tablet Place 1 tablet (0.4 mg total) under the tongue every 5 (five) minutes as needed for chest pain. X 3 doses 06/09/21   [name], [name], MD  oxyCODONE  (ROXICODONE ) 5 MG immediate release tablet Take 1 tablet (5 mg total) by mouth every 4 (four) hours as needed for severe pain. 09/29/22   [name], [name], MD  polyethylene glycol (MIRALAX  / GLYCOLAX ) packet Take 17 g by mouth daily as needed for mild constipation.    [provider]  potassium chloride  SA (KLOR-CON  M) 20 MEQ tablet Take 1 tablet (20 mEq total) by mouth every other day. 05/03/22   Johnson, Clanford L, MD  predniSONE   (STERAPRED UNI-PAK 21 TAB) 10 MG (21) TBPK tablet 10mg  Tabs, 6 day taper. Use as directed 08/29/22   Roselyn Carlin NOVAK, MD  QUEtiapine  (SEROQUEL ) 25 MG tablet TAKE 0.5 TABLETS BY MOUTH AT BEDTIME. 07/06/22   [name], [name], MD  rivastigmine  (EXELON ) 1.5 MG capsule Take 1 capsule (1.5 mg total) by mouth 2 (two) times daily. 10/06/22 05/04/23  Camara, Amadou, MD  TRESIBA  FLEXTOUCH 100 UNIT/ML FlexTouch Pen Inject 32 units into THE SKIN AT bedtime 08/31/22   [name], [name], MD      Allergies    Contrast media [iodinated contrast media]    Review of Systems   Review of Systems  Constitutional:  Negative for fever.  Gastrointestinal:  Negative for nausea and vomiting.  Musculoskeletal:  Negative for arthralgias, back pain, joint swelling and neck pain.  Skin:  Positive for wound.  Neurological:  Negative for weakness, numbness and headaches.    Physical Exam Updated Vital Signs BP (!) 150/84 (BP Location: Left Arm)   Pulse 72   Temp 98.3 F (36.8 C)   Resp 18   Ht 5' 8 (1.727 m)   Wt 74.4 kg   SpO2 95%   BMI 24.94 kg/m  Physical Exam Constitutional:      Appearance: He is well-developed.  HENT:     Head: Normocephalic and atraumatic.  Cardiovascular:     Rate and Rhythm: Normal rate.  Pulmonary:     Effort: Pulmonary effort is normal.  Musculoskeletal:        General: Tenderness present.     Cervical back: Normal range of motion and neck supple.     Comments: Positive laceration to the dorsal surface of the right hand at the base of the middle finger.  Sutures are intact.  There are some duskiness to the wound edges.  It does not appear that they are well-healed.  There is no drainage.  There is a little bit of blood oozing since the dressing was pulled off.  No redness or suggestions of infection.  Skin:    General: Skin is warm and dry.  Neurological:     Mental Status: He is alert and oriented to person, place, and time.     ED Results / Procedures /  Treatments   Labs (all labs ordered are listed, but only abnormal results are displayed) Labs Reviewed - No data to display  EKG None  Radiology No results found.  Procedures Procedures    Medications Ordered in ED Medications - No data to display  ED Course/ Medical Decision Making/ A&P  Medical Decision Making  Patient presents for possible suture removal.  At this point, it does not appear that the sutures are ready to be removed.  There is some duskiness at the skin edges so it may be that it will end up healing by secondary intention.  However will leave the sutures in for a few more days.  I do not see any suggestions of infection.  Advised the patient to follow-up with his primary care doctor in about 5 days for recheck.  Advised to wash the area daily and reapply antibiotic ointment and dressing.  Will apply a metal finger splint to the area that he can remove as needed.  His tetanus shot was updated on his last visit.  He was discharged home in good condition.  Return precautions were given.  Final Clinical Impression(s) / ED Diagnoses Final diagnoses:  Laceration of right middle finger without foreign body without damage to nail, initial encounter    Rx / DC Orders ED Discharge Orders     None         Lenor Hollering, MD 02/06/23 1114

## 2023-02-11 ENCOUNTER — Encounter: Payer: Self-pay | Admitting: Internal Medicine

## 2023-02-11 ENCOUNTER — Ambulatory Visit (INDEPENDENT_AMBULATORY_CARE_PROVIDER_SITE_OTHER): Payer: Medicare Other | Admitting: Internal Medicine

## 2023-02-11 ENCOUNTER — Encounter: Payer: Medicare Other | Admitting: Family Medicine

## 2023-02-11 VITALS — BP 110/88 | Temp 97.5°F | Wt 161.3 lb

## 2023-02-11 DIAGNOSIS — Z4802 Encounter for removal of sutures: Secondary | ICD-10-CM

## 2023-02-11 DIAGNOSIS — S61411D Laceration without foreign body of right hand, subsequent encounter: Secondary | ICD-10-CM | POA: Diagnosis not present

## 2023-02-11 MED ORDER — DOXYCYCLINE HYCLATE 100 MG PO TABS: 100.0000 mg | ORAL_TABLET | Freq: Two times a day (BID) | ORAL | 0 refills | Status: AC

## 2023-02-11 NOTE — Progress Notes (Signed)
 Established Patient Office Visit     CC/Reason for Visit: Suture removal  HPI: Jason Fisher is a 83 y.o. male who is coming in today for the above mentioned reasons. He suffered a laceration on the dorsal surface of his right hand on December 28 went to the emergency department where it was repaired.  He returned on January 4 for removal and was told at that time that stitches were not ready for removal and to follow-up with me in 5 days.  The wife has noted white oozing with dressing changes every morning.   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 2007 Inf STEMI s/p Vision BMS  2 to RCA;  b. 2009 ISR Prox RCA Stent-->with DES;  c. 09/2015 PCI: LM nl, LAD 30p/m, D1 90, LCX nl, OM1 small, 80, OM2 90 (2.5x14 Biofreedom stent), OM3 small, RCA 30p ISR, 49m ISR, 109m, 85d, EF 50-55%.   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 On Eliquis  (CHA2DS2VASc = 5).  Rate controlled w/ BB and CCB.   Chronic diastolic CHF (congestive heart failure) (HCC)    a. 09/2015 Echo: EF 55-60%, mildly dil LA.   CKD (chronic kidney disease), stage III (HCC)    they mentioned this to the patient but they would work on it later after the aneurysm   COLONIC POLYPS, HX OF 11/02/2006   COPD (chronic obstructive pulmonary disease) (HCC)    DIABETES MELLITUS, TYPE II 10/29/2006   Diverticulosis    History of tobacco abuse    a. Quit 09/2015.   HYPERLIPIDEMIA 10/29/2006   Hypertension    Hypertensive heart disease with heart failure (HCC)    Pneumonia 09/2015   thelbert 07/31/2016   STEMI (ST elevation myocardial infarction) [facility]) 2007   thelbert 07/31/2016    Past Surgical History:  Procedure Laterality Date   ABDOMINAL AORTIC ENDOVASCULAR STENT GRAFT N/A 07/31/2016   Procedure: ABDOMINAL AORTIC ENDOVASCULAR STENT GRAFT;  Surgeon:  Serene Gaile ORN, MD;  Location: [facility] OR;  Service: Vascular;  Laterality: N/A;   CARDIAC CATHETERIZATION N/A 09/26/2015   Procedure: Left Heart Cath and Coronary Angiography;  Surgeon: Peter M Jordan, MD;  Location: [facility] INVASIVE CV LAB;  Service: Cardiovascular;  Laterality: N/A;   CARDIAC CATHETERIZATION N/A 09/26/2015   Procedure: Coronary Stent Intervention;  Surgeon: Peter M Jordan, MD;  Location: [facility] INVASIVE CV LAB;  Service: Cardiovascular;  Laterality: N/A;   CATARACT EXTRACTION Bilateral    COLONOSCOPY     CORONARY ANGIOPLASTY WITH STENT PLACEMENT  2007   Inferior STEMI 2007 - RCA PCI with 2 Vision BMS; Abnormal Myoview in 2009 - pRCA ins-stent restenosis & unstented disease - DES PCI Promus 3.0 x 20 mm (3.5 mm)   ENDOVASCULAR REPAIR/STENT GRAFT  07/31/2016   INCISION AND DRAINAGE PERIRECTAL ABSCESS N/A 11/06/2015   Procedure: IRRIGATION AND DEBRIDEMENT PERIRECTAL ABSCESS, POSSIBLE HEMORRHOIDECTOMY;  Surgeon: Vicenta Poli, MD;  Location: [facility] OR;  Service: General;  Laterality: N/A;   ORIF SHOULDER FRACTURE Right    fell out of back end of a truck    Social History:  reports that he quit smoking about 7 years ago. His smoking use included cigarettes. He started smoking about 56 years ago. He has a 49 pack-year smoking history. He has been exposed to tobacco smoke. He  has never used smokeless tobacco. He reports that he does not drink alcohol and does not use drugs.  Allergies: Allergies  Allergen Reactions   Contrast Media [Iodinated Contrast Media] Anaphylaxis    Family History:  Family History  Problem Relation Age of Onset   Heart attack Mother    Diabetes Mother    Heart attack Father    Hypertension Father    Heart attack Brother    Diabetes Brother      Current Outpatient Medications:    ACCU-CHEK AVIVA PLUS test strip, CHECK BLOOD SUGAR ONCE DAILY. DX E11.51, Disp: 100 strip, Rfl: 2   apixaban  (ELIQUIS ) 2.5 MG TABS tablet, Take 1 tablet (2.5 mg total) by mouth 2  (two) times daily., Disp: 180 tablet, Rfl: 1   atorvastatin  (LIPITOR) 40 MG tablet, Take 1 tablet (40 mg total) by mouth daily., Disp: 90 tablet, Rfl: 1   Budeson-Glycopyrrol-Formoterol  (BREZTRI  AEROSPHERE) 160-9-4.8 MCG/ACT AERO, Inhale 2 puffs into the lungs in the morning and at bedtime., Disp: 10.7 g, Rfl: 12   Continuous Blood Gluc Receiver (DEXCOM G6 RECEIVER) DEVI, CHECK VLOOD SUGAR AS DIRECTED AS DIRECTED, Disp: 1 each, Rfl: 0   Continuous Blood Gluc Sensor (DEXCOM G6 SENSOR) MISC, APPLY 1 SENSOR EVERY 10 DAYS, Disp: 3 each, Rfl: 2   Continuous Blood Gluc Transmit (DEXCOM G6 TRANSMITTER) MISC, APPLY TO SENSOR EVERY 90 DAYS, Disp: 1 each, Rfl: 0   Cyanocobalamin  (B-12 COMPLIANCE INJECTION IJ), Inject as directed every 30 (thirty) days., Disp: , Rfl:    dextromethorphan  (DELSYM ) 30 MG/5ML liquid, Take 5 mLs (30 mg total) by mouth 2 (two) times daily as needed for cough., Disp: 89 mL, Rfl: 1   diltiazem  (CARDIZEM  CD) 360 MG 24 hr capsule, Take 1 capsule (360 mg total) by mouth daily., Disp: 90 capsule, Rfl: 1   doxycycline  (VIBRA -TABS) 100 MG tablet, Take 1 tablet (100 mg total) by mouth 2 (two) times daily for 7 days., Disp: 14 tablet, Rfl: 0   folic acid  (FOLVITE ) 1 MG tablet, Take 1 tablet (1 mg total) by mouth daily., Disp: 30 tablet, Rfl: 1   furosemide  (LASIX ) 80 MG tablet, Take 1 tablet (80 mg total) by mouth daily., Disp: 90 tablet, Rfl: 1   ipratropium-albuterol  (DUONEB) 0.5-2.5 (3) MG/3ML SOLN, Take 3 mLs by nebulization every 4 (four) hours as needed (wheezing, shortness of breath, cough)., Disp: 360 mL, Rfl: 1   isosorbide  mononitrate (IMDUR ) 30 MG 24 hr tablet, Take 1 tablet (30 mg total) by mouth daily., Disp: 90 tablet, Rfl: 3   levalbuterol  (XOPENEX  HFA) 45 MCG/ACT inhaler, INHALE 1 TO 2 PUFFS BY MOUTH EVERY 6 HOURS AS NEEDED FOR WHEEZE [id number], Disp: 15 each, Rfl: 3   metFORMIN  (GLUCOPHAGE ) 1000 MG tablet, TAKE ONE TABLET BY MOUTH AT BREAKFAST AND AT BEDTIME, Disp: 180 tablet, Rfl:  1   metoprolol  tartrate (LOPRESSOR ) 100 MG tablet, Take 1 tablet (100 mg total) by mouth daily at 6 (six) AM., Disp: 180 tablet, Rfl: 3   nitroGLYCERIN  (NITROSTAT ) 0.4 MG SL tablet, Place 1 tablet (0.4 mg total) under the tongue every 5 (five) minutes as needed for chest pain. X 3 doses, Disp: 25 tablet, Rfl: 4   oxyCODONE  (ROXICODONE ) 5 MG immediate release tablet, Take 1 tablet (5 mg total) by mouth every 4 (four) hours as needed for severe pain., Disp: 7 tablet, Rfl: 0   polyethylene glycol (MIRALAX  / GLYCOLAX ) packet, Take 17 g by mouth daily as needed for mild constipation., Disp: , Rfl:  potassium chloride  SA (KLOR-CON  M) 20 MEQ tablet, Take 1 tablet (20 mEq total) by mouth every other day., Disp: 15 tablet, Rfl: 0   predniSONE  (STERAPRED UNI-PAK 21 TAB) 10 MG (21) TBPK tablet, 10mg  Tabs, 6 day taper. Use as directed, Disp: 1 each, Rfl: 0   QUEtiapine  (SEROQUEL ) 25 MG tablet, TAKE 0.5 TABLETS BY MOUTH AT BEDTIME., Disp: 45 tablet, Rfl: 2   rivastigmine  (EXELON ) 1.5 MG capsule, Take 1 capsule (1.5 mg total) by mouth 2 (two) times daily., Disp: 60 capsule, Rfl: 6   TRESIBA  FLEXTOUCH 100 UNIT/ML FlexTouch Pen, Inject 32 units into THE SKIN AT bedtime, Disp: 15 mL, Rfl: 2  Review of Systems:  Negative unless indicated in HPI.   Physical Exam: Vitals:   02/11/23 0857  BP: 110/88  Temp: (!) 97.5 F (36.4 C)  TempSrc: Oral  Weight: 161 lb 4.8 oz (73.2 kg)    Body mass index is 24.53 kg/m.   Physical Exam Skin:    Comments: After removal of dressing over laceration of his right dorsal hand, there is apparent purulent drainage and edges do not appear well-approximated.      Impression and Plan:  Visit for suture removal  Laceration of right hand without foreign body, subsequent encounter -     Doxycycline  Hyclate; Take 1 tablet (100 mg total) by mouth 2 (two) times daily for 7 days.  Dispense: 14 tablet; Refill: 0   -Unfortunately by the time he sees me there appears to be  some infection of the wound with purulent draining.  Also skin has grown over the sutures making it a technically difficult procedure taking a longer time than expected.  I was able to remove 3 stitches.  Reading the report it appears 4 stitches were placed but the last stitch is not identifiable. -I will send 7 days of doxycycline .  Time spent:31 minutes reviewing chart, interviewing and examining patient and formulating plan of care.     Tully [name], MD [facility]

## 2023-03-07 NOTE — Progress Notes (Signed)
 Cardiology Office Note    Date:  03/10/2023   ID:  [name], DOB 04-Sep-1940, MRN [id number]  PCP:  Jason Fisher, [name], MD  Cardiologist:  Dr. Nissi Doffing   Chief Complaint  Patient presents with   Coronary Artery Disease    History of Present Illness:  Jason Fisher is a 83 y.o. male seen for follow up CAD and Afib. He has a PMH of CAD (s/p BMS to RCA 2007, ISR with DES to RCA in 2009, DES to OM2 in 09/2015), chronic atrial fibrillation on Eliquis , chronic diastolic heart failure, HTN, HLD, AAA, COPD, and tobacco use.  He was previously admitted in March 2018 for atrial fibrillation with RVR.  Troponin was borderline elevated.  He was initially treated with IV Cardizem  and later switched to p.o. Cardizem  prior to discharge.  He also underwent AAA endovascular stent grafting by Dr. Serene in June 2018.  He was seen in October 2019 by Scot Ford PA-C for increased edema.  Lasix  was increased to 80 mg twice daily for 3 days before decreasing back down to 40 mg twice daily thereafter.  Echocardiogram on 11/05/2017 this showed EF 60 to 65%, mildly increased aortic root measuring 40 mm, moderately dilated left and the right atrium.   He was admitted in April 2022 with worsening dyspnea on exertion.  While in the emergency room, blood pressure was 207/109.  He was admitted for acute on chronic diastolic heart failure and Afib with RVR.  Echocardiogram obtained during that admission showed EF 50 to 55%, no regional wall motion abnormality, moderate LVH.  He was eventually released on the prior home dose of Lasix  80 mg daily.  Cardizem  was added back every 6 hours and he was eventually transitioned to  Cardizem  CD.  AAA ultrasound obtained in June 2022 showed a stable sac measuring 3.98 in transverse dimension. When seen in September Afib rate was controlled. TFTs were normal.  On follow up today he has chronic SOB. He is not smoking. Denies any edema and weight is down 7 lbs. Notes feeling dizzy at  times and fatigued. No chest pain or palpitations.    Past Medical History:  Diagnosis Date   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 2007 Inf STEMI s/p Vision BMS  2 to RCA;  b. 2009 ISR Prox RCA Stent-->with DES;  c. 09/2015 PCI: LM nl, LAD 30p/m, D1 90, LCX nl, OM1 small, 80, OM2 90 (2.5x14 Biofreedom stent), OM3 small, RCA 30p ISR, 47m ISR, 88m, 85d, EF 50-55%.   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 On Eliquis  (CHA2DS2VASc = 5).  Rate controlled w/ BB and CCB.   Chronic diastolic CHF (congestive heart failure) (HCC)    a. 09/2015 Echo: EF 55-60%, mildly dil LA.   CKD (chronic kidney disease), stage III (HCC)    they mentioned this to the patient but they would work on it later after the aneurysm   COLONIC POLYPS, HX OF 11/02/2006   COPD (chronic obstructive pulmonary disease) (HCC)    DIABETES MELLITUS, TYPE II 10/29/2006   Diverticulosis    History of tobacco abuse    a. Quit 09/2015.   HYPERLIPIDEMIA 10/29/2006   Hypertension    Hypertensive heart disease with heart failure (HCC)    Pneumonia 09/2015   thelbert 07/31/2016   STEMI (ST elevation myocardial infarction) (HCC)  2007   thelbert 07/31/2016    Past Surgical History:  Procedure Laterality Date   ABDOMINAL AORTIC ENDOVASCULAR STENT GRAFT N/A 07/31/2016   Procedure: ABDOMINAL AORTIC ENDOVASCULAR STENT GRAFT;  Surgeon: Serene Gaile ORN, MD;  Location: [facility] OR;  Service: Vascular;  Laterality: N/A;   CARDIAC CATHETERIZATION N/A 09/26/2015   Procedure: Left Heart Cath and Coronary Angiography;  Surgeon: Keziyah Kneale M Amelianna Meller, MD;  Location: [facility] INVASIVE CV LAB;  Service: Cardiovascular;  Laterality: N/A;   CARDIAC CATHETERIZATION N/A 09/26/2015   Procedure: Coronary Stent Intervention;  Surgeon: Aniah Pauli M Islah Eve, MD;  Location: [facility] INVASIVE CV LAB;  Service: Cardiovascular;  Laterality: N/A;   CATARACT EXTRACTION Bilateral     COLONOSCOPY     CORONARY ANGIOPLASTY WITH STENT PLACEMENT  2007   Inferior STEMI 2007 - RCA PCI with 2 Vision BMS; Abnormal Myoview in 2009 - pRCA ins-stent restenosis & unstented disease - DES PCI Promus 3.0 x 20 mm (3.5 mm)   ENDOVASCULAR REPAIR/STENT GRAFT  07/31/2016   INCISION AND DRAINAGE PERIRECTAL ABSCESS N/A 11/06/2015   Procedure: IRRIGATION AND DEBRIDEMENT PERIRECTAL ABSCESS, POSSIBLE HEMORRHOIDECTOMY;  Surgeon: Vicenta Poli, MD;  Location: [facility] OR;  Service: General;  Laterality: N/A;   ORIF SHOULDER FRACTURE Right    fell out of back end of a truck    Current Medications: Outpatient Medications Prior to Visit  Medication Sig Dispense Refill   ACCU-CHEK AVIVA PLUS test strip CHECK BLOOD SUGAR ONCE DAILY. DX E11.51 100 strip 2   apixaban  (ELIQUIS ) 2.5 MG TABS tablet Take 1 tablet (2.5 mg total) by mouth 2 (two) times daily. 180 tablet 1   atorvastatin  (LIPITOR) 40 MG tablet Take 1 tablet (40 mg total) by mouth daily. 90 tablet 1   Budeson-Glycopyrrol-Formoterol  (BREZTRI  AEROSPHERE) 160-9-4.8 MCG/ACT AERO Inhale 2 puffs into the lungs in the morning and at bedtime. 10.7 g 12   Continuous Blood Gluc Receiver (DEXCOM G6 RECEIVER) DEVI CHECK VLOOD SUGAR AS DIRECTED AS DIRECTED 1 each 0   Continuous Blood Gluc Sensor (DEXCOM G6 SENSOR) MISC APPLY 1 SENSOR EVERY 10 DAYS 3 each 2   Continuous Blood Gluc Transmit (DEXCOM G6 TRANSMITTER) MISC APPLY TO SENSOR EVERY 90 DAYS 1 each 0   Cyanocobalamin  (B-12 COMPLIANCE INJECTION IJ) Inject as directed every 30 (thirty) days.     dextromethorphan  (DELSYM ) 30 MG/5ML liquid Take 5 mLs (30 mg total) by mouth 2 (two) times daily as needed for cough. 89 mL 1   diltiazem  (CARDIZEM  CD) 360 MG 24 hr capsule Take 1 capsule (360 mg total) by mouth daily. 90 capsule 1   folic acid  (FOLVITE ) 1 MG tablet Take 1 tablet (1 mg total) by mouth daily. 30 tablet 1   ipratropium-albuterol  (DUONEB) 0.5-2.5 (3) MG/3ML SOLN Take 3 mLs by nebulization every 4 (four)  hours as needed (wheezing, shortness of breath, cough). 360 mL 1   isosorbide  mononitrate (IMDUR ) 30 MG 24 hr tablet Take 1 tablet (30 mg total) by mouth daily. 90 tablet 3   levalbuterol  (XOPENEX  HFA) 45 MCG/ACT inhaler INHALE 1 TO 2 PUFFS BY MOUTH EVERY 6 HOURS AS NEEDED FOR WHEEZE [id number] 15 each 3   metFORMIN  (GLUCOPHAGE ) 1000 MG tablet TAKE ONE TABLET BY MOUTH AT BREAKFAST AND AT BEDTIME 180 tablet 1   metoprolol  tartrate (LOPRESSOR ) 100 MG tablet Take 1 tablet (100 mg total) by mouth daily at 6 (six) AM. 180 tablet 3   nitroGLYCERIN  (NITROSTAT ) 0.4 MG SL tablet Place 1 tablet (0.4 mg total) under the tongue every  5 (five) minutes as needed for chest pain. X 3 doses 25 tablet 4   oxyCODONE  (ROXICODONE ) 5 MG immediate release tablet Take 1 tablet (5 mg total) by mouth every 4 (four) hours as needed for severe pain. 7 tablet 0   polyethylene glycol (MIRALAX  / GLYCOLAX ) packet Take 17 g by mouth daily as needed for mild constipation.     QUEtiapine  (SEROQUEL ) 25 MG tablet TAKE 0.5 TABLETS BY MOUTH AT BEDTIME. 45 tablet 2   rivastigmine  (EXELON ) 1.5 MG capsule Take 1 capsule (1.5 mg total) by mouth 2 (two) times daily. 60 capsule 6   TRESIBA  FLEXTOUCH 100 UNIT/ML FlexTouch Pen Inject 32 units into THE SKIN AT bedtime 15 mL 2   furosemide  (LASIX ) 80 MG tablet Take 1 tablet (80 mg total) by mouth daily. 90 tablet 1   potassium chloride  SA (KLOR-CON  M) 20 MEQ tablet Take 1 tablet (20 mEq total) by mouth every other day. (Patient not taking: Reported on 03/10/2023) 15 tablet 0   predniSONE  (STERAPRED UNI-PAK 21 TAB) 10 MG (21) TBPK tablet 10mg  Tabs, 6 day taper. Use as directed (Patient not taking: Reported on 03/10/2023) 1 each 0   No facility-administered medications prior to visit.     Allergies:   Contrast media [iodinated contrast media]   Social History   Socioeconomic History   Marital status: Married    Spouse name: Not on file   Number of children: 5   Years of education: Not on file    Highest education level: Not on file  Occupational History   Occupation: [employer]  Tobacco Use   Smoking status: Former    Current packs/day: 0.00    Average packs/day: 1 pack/day for 49.0 years (49.0 ttl pk-yrs)    Types: Cigarettes    Start date: 09/08/1966    Quit date: 09/08/2015    Years since quitting: 7.5    Passive exposure: Current   Smokeless tobacco: Never   Tobacco comments:    passive smoker from 2017-2024  Vaping Use   Vaping status: Never Used  Substance and Sexual Activity   Alcohol use: No    Alcohol/week: 0.0 standard drinks of alcohol   Drug use: No   Sexual activity: Yes    Birth control/protection: None  Other Topics Concern   Not on file  Social History Narrative   Not on file   Social Drivers of Health   Financial Resource Strain: Medium Risk (09/23/2020)   Overall Financial Resource Strain (CARDIA)    Difficulty of Paying Living Expenses: Somewhat hard  Food Insecurity: No Food Insecurity (05/05/2022)   Hunger Vital Sign    Worried About Running Out of Food in the Last Year: Never true    Ran Out of Food in the Last Year: Never true  Transportation Needs: No Transportation Needs (05/05/2022)   PRAPARE - Administrator, Civil Service (Medical): No    Lack of Transportation (Non-Medical): No  Physical Activity: Sufficiently Active (06/08/2019)   Exercise Vital Sign    Days of Exercise per Week: 3 days    Minutes of Exercise per Session: 60 min  Stress: Not on file  Social Connections: Not on file     Family History:  The patient's family history includes Diabetes in his brother and mother; Heart attack in his brother, father, and mother; Hypertension in his father.   ROS:   Please see the history of present illness.    ROS All other systems reviewed and are  negative.   PHYSICAL EXAM:   VS:  BP 118/64   Pulse (!) 48   Ht 5' 8 (1.727 m)   Wt 159 lb 6.4 oz (72.3 kg)   SpO2 91%   BMI 24.24 kg/m    GENERAL:  Chronically ill  appearing WM in NAD HEENT:  PERRL, EOMI, sclera are clear. Oropharynx is clear. NECK:  No jugular venous distention, carotid upstroke brisk and symmetric, no bruits, no thyromegaly or adenopathy LUNGS:  diminished BS bilaterally no active wheezing. CHEST:  Unremarkable HEART:  IRRR, rate slow,  PMI not displaced or sustained,S1 and S2 within normal limits, no S3, no S4: no clicks, no rubs, no murmurs ABD:  Soft, nontender. BS +, no masses or bruits. No hepatomegaly, no splenomegaly EXT:  2 + pulses throughout, no edema, no cyanosis no clubbing SKIN:  Warm and dry.  No rashes NEURO:  Alert and oriented x 3. Cranial nerves II through XII intact. PSYCH:  Cognitively intact    Wt Readings from Last 3 Encounters:  03/10/23 159 lb 6.4 oz (72.3 kg)  02/11/23 161 lb 4.8 oz (73.2 kg)  02/06/23 164 lb 0.4 oz (74.4 kg)         Studies/Labs Reviewed:   EKG:  EKG is not  ordered today.      Recent Labs: 04/27/2022: ALT 20 05/01/2022: Magnesium  2.4 06/30/2022: TSH 2.020 08/29/2022: B Natriuretic Peptide 587.0; BUN 26; Creatinine, Ser 1.71; Hemoglobin 12.7; Platelets 194; Potassium 3.6; Sodium 141   Lipid Panel    Component Value Date/Time   CHOL 112 10/13/2021 1119   TRIG 70 10/13/2021 1119   HDL 39 (L) 10/13/2021 1119   CHOLHDL 2.9 10/13/2021 1119   CHOLHDL 3 02/10/2021 1404   VLDL 18.8 02/10/2021 1404   LDLCALC 58 10/13/2021 1119    Additional studies/ records that were reviewed today include:   Cath 09/26/2015 Prox LAD to Mid LAD lesion, 30 %stenosed. 1st Diag lesion, 90 %stenosed. Ost 1st Mrg to 1st Mrg lesion, 80 %stenosed. Ost RCA to Prox RCA lesion, 30 %stenosed. Mid RCA-2 lesion, 40 %stenosed. Mid RCA-1 lesion, 15 %stenosed. 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 There is mild left ventricular systolic dysfunction. The left ventricular ejection fraction is 50-55% by visual estimate. LV end diastolic pressure is moderately elevated. A drug  eluting stent was successfully placed. 2nd Mrg lesion, 90 %stenosed. Post intervention, there is a 0% residual stenosis.   1. 3 vessel obstructive CAD.     - chronic severe stenosis in the first diagonal    - New 90% stenosis in a large second OM    - Stents in the proximal and mid RCA are patent    - Aneurysm and stenosis in the PLOM branch of the RCA- the aneurysm is new compared to 2009 2. Mild LV dysfunction 3. Elevated LVEDP 4. Allergic response to contrast with hives and bronchospasm. 5. Successful stenting of the second OM with a Biofreedom stent.    Plan: continue IV diuresis. Patient received steroids, Benadryl , and IV pepcid  for contrast reaction. Continue DAPT for one month then DC ASA. Resume Eliquis  in am.        Echo 11/05/2017 LV EF: 60% -   65% Study Conclusions   - Left ventricle: The cavity size was normal. There was moderate   concentric hypertrophy. Systolic function was normal. The   estimated ejection fraction was in the range of 60% to 65%. Wall   motion was  normal; there were no regional wall motion   abnormalities. The study was not technically sufficient to allow   evaluation of LV diastolic dysfunction due to atrial   fibrillation. - Aortic valve: Poorly visualized. Trileaflet; moderately   thickened, moderately calcified leaflets. - Aorta: Aortic root dimension: 40 mm (ED). Ascending aortic   diameter: 38 mm (S). - Aortic root: The aortic root was mildly dilated. - Ascending aorta: The ascending aorta was mildly dilated. - Left atrium: The atrium was moderately dilated. - Right atrium: The atrium was moderately dilated. - Pulmonic valve: There was mild regurgitation. - Pulmonary arteries: Systolic pressure could not be accurately   estimated.     Echo 05/11/2020  1. Left ventricular ejection fraction, by estimation, is 50 to 55%. The  left ventricle has low normal function. The left ventricle has no regional  wall motion abnormalities. There is  moderate left ventricular hypertrophy.  Left ventricular diastolic  function could not be evaluated.   2. Right ventricular systolic function is normal. The right ventricular  size is normal. There is normal pulmonary artery systolic pressure.   3. Left atrial size was mildly dilated.   4. The mitral valve is grossly normal. Trivial mitral valve  regurgitation.   5. The aortic valve is grossly normal. Aortic valve regurgitation is  trivial.   6. The inferior vena cava is dilated in size with >50% respiratory  variability, suggesting right atrial pressure of 8 mmHg.     ASSESSMENT:    1. Permanent atrial fibrillation (HCC)   2. Acute on chronic diastolic congestive heart failure (HCC)   3. Coronary artery disease involving native coronary artery of native heart without angina pectoris   4. Chronic diastolic congestive heart failure (HCC)   5. Essential hypertension   6. Hyperlipidemia LDL goal <70        PLAN:  In order of problems listed above:  Chronic atrial fibrillation. Rate is slow. On metoprolol  tartrate 100 mg daily and Cardizem  360 mg daily. Will switch metoprolol  to succinate at reduced dose of 50 mg daily.   No palpitations. Continue Eliquis .   CAD: s/p multiple interventions in the past- last in 2017. On diltiazem , isosorbide  and lopressor . On statin. Noted some chest tightness before that has improved with addition of  Imdur  30 mg daily. No significant angina. Dyspnea more related to COPD.   Chronic diastolic CHF: volume status is good on current lasix  dose  80 mg daily. Recommend reducing to 40 mg daily due to dizziness. Monitor weight and swelling closely  Hyperlipidemia: On Lipitor 40 mg daily.  Will update labs today  History of AAA repair: followed by [facility].   6..  DM type 2 on metformin .   7.   Severe COPD - ongoing SOB.  follow up with pulmonary  8. HTN. BP is well controlled.   Follow up in 6 months   Medication Adjustments/Labs and Tests  Ordered: Current medicines are reviewed at length with the patient today.  Concerns regarding medicines are outlined above.  Medication changes, Labs and Tests ordered today are listed in the Patient Instructions below. There are no Patient Instructions on file for this visit.    Signed, Barri Neidlinger, MD  03/10/2023 10:44 AM    [facility] [address] [place], [place], [place]  [address] Phone: [phone]; Fax: [phone]

## 2023-03-10 ENCOUNTER — Ambulatory Visit: Payer: Medicare Other | Attending: Cardiology | Admitting: Cardiology

## 2023-03-10 ENCOUNTER — Encounter: Payer: Self-pay | Admitting: Cardiology

## 2023-03-10 VITALS — BP 118/64 | HR 48 | Ht 68.0 in | Wt 159.4 lb

## 2023-03-10 DIAGNOSIS — E785 Hyperlipidemia, unspecified: Secondary | ICD-10-CM | POA: Diagnosis not present

## 2023-03-10 DIAGNOSIS — I2510 Atherosclerotic heart disease of native coronary artery without angina pectoris: Secondary | ICD-10-CM

## 2023-03-10 DIAGNOSIS — I5032 Chronic diastolic (congestive) heart failure: Secondary | ICD-10-CM

## 2023-03-10 DIAGNOSIS — I4821 Permanent atrial fibrillation: Secondary | ICD-10-CM

## 2023-03-10 DIAGNOSIS — I5033 Acute on chronic diastolic (congestive) heart failure: Secondary | ICD-10-CM | POA: Diagnosis not present

## 2023-03-10 DIAGNOSIS — I10 Essential (primary) hypertension: Secondary | ICD-10-CM | POA: Diagnosis not present

## 2023-03-10 MED ORDER — FUROSEMIDE 80 MG PO TABS: 40.0000 mg | ORAL_TABLET | Freq: Every day | ORAL | 1 refills | Status: DC

## 2023-03-10 MED ORDER — METOPROLOL SUCCINATE ER 50 MG PO TB24: 50.0000 mg | ORAL_TABLET | Freq: Every day | ORAL | 3 refills | Status: DC

## 2023-03-10 NOTE — Patient Instructions (Signed)
 Medication Instructions:  Stop Metoprolol  Tartrate Start Metoprolol  Succinate 50 mg daily Decrease Lasix  to 40 mg daily Continue all other medications *If you need a refill on your cardiac medications before your next appointment, please call your pharmacy*   Lab Work: Cbc,bmet,lipid and hepatic panels today   Testing/Procedures: None ordered   Follow-Up: At [facility], you and your health needs are our priority.  As part of our continuing mission to provide you with exceptional heart care, we have created designated Provider Care Teams.  These Care Teams include your primary Cardiologist (physician) and Advanced Practice Providers (APPs -  Physician Assistants and Nurse Practitioners) who all work together to provide you with the care you need, when you need it.  We recommend signing up for the patient portal called MyChart.  Sign up information is provided on this After Visit Summary.  MyChart is used to connect with patients for Virtual Visits (Telemedicine).  Patients are able to view lab/test results, encounter notes, upcoming appointments, etc.  Non-urgent messages can be sent to your provider as well.   To learn more about what you can do with MyChart, go to [email or web address].    Your next appointment:  6 months   Call in June to schedule August appointment   Provider:  Dr.Jordan

## 2023-03-11 ENCOUNTER — Ambulatory Visit: Payer: Medicare Other | Admitting: Family Medicine

## 2023-03-11 ENCOUNTER — Encounter: Payer: Medicare Other | Admitting: Family Medicine

## 2023-03-11 DIAGNOSIS — Z0000 Encounter for general adult medical examination without abnormal findings: Secondary | ICD-10-CM

## 2023-03-11 LAB — CBC WITH DIFFERENTIAL/PLATELET
Basophils Absolute: 0.1 10*3/uL (ref 0.0–0.2)
Basos: 1 %
EOS (ABSOLUTE): 0.2 10*3/uL (ref 0.0–0.4)
Eos: 3 %
Hematocrit: 40 % (ref 37.5–51.0)
Hemoglobin: 12.4 g/dL — ABNORMAL LOW (ref 13.0–17.7)
Immature Grans (Abs): 0 10*3/uL (ref 0.0–0.1)
Immature Granulocytes: 0 %
Lymphocytes Absolute: 2.1 10*3/uL (ref 0.7–3.1)
Lymphs: 23 %
MCH: 28 pg (ref 26.6–33.0)
MCHC: 31 g/dL — ABNORMAL LOW (ref 31.5–35.7)
MCV: 90 fL (ref 79–97)
Monocytes Absolute: 0.9 10*3/uL (ref 0.1–0.9)
Monocytes: 10 %
Neutrophils Absolute: 5.8 10*3/uL (ref 1.4–7.0)
Neutrophils: 63 %
Platelets: 219 10*3/uL (ref 150–450)
RBC: 4.43 x10E6/uL (ref 4.14–5.80)
RDW: 14.5 % (ref 11.6–15.4)
WBC: 9.1 10*3/uL (ref 3.4–10.8)

## 2023-03-11 LAB — BASIC METABOLIC PANEL
BUN/Creatinine Ratio: 13 (ref 10–24)
BUN: 25 mg/dL (ref 8–27)
CO2: 23 mmol/L (ref 20–29)
Calcium: 9.7 mg/dL (ref 8.6–10.2)
Chloride: 105 mmol/L (ref 96–106)
Creatinine, Ser: 2 mg/dL — ABNORMAL HIGH (ref 0.76–1.27)
Glucose: 116 mg/dL — ABNORMAL HIGH (ref 70–99)
Potassium: 4.1 mmol/L (ref 3.5–5.2)
Sodium: 147 mmol/L — ABNORMAL HIGH (ref 134–144)
eGFR: 33 mL/min/{1.73_m2} — ABNORMAL LOW (ref >59)

## 2023-03-11 LAB — HEPATIC FUNCTION PANEL
ALT: 13 [IU]/L (ref 0–44)
AST: 22 [IU]/L (ref 0–40)
Albumin: 4.3 g/dL (ref 3.7–4.7)
Alkaline Phosphatase: 84 [IU]/L (ref 44–121)
Bilirubin Total: 0.6 mg/dL (ref 0.0–1.2)
Bilirubin, Direct: 0.22 mg/dL (ref 0.00–0.40)
Total Protein: 6.9 g/dL (ref 6.0–8.5)

## 2023-03-11 LAB — LIPID PANEL
Chol/HDL Ratio: 3.1 {ratio} (ref 0.0–5.0)
Cholesterol, Total: 135 mg/dL (ref 100–199)
HDL: 43 mg/dL (ref >39)
LDL Chol Calc (NIH): 72 mg/dL (ref 0–99)
Triglycerides: 106 mg/dL (ref 0–149)
VLDL Cholesterol Cal: 20 mg/dL (ref 5–40)

## 2023-03-11 NOTE — Progress Notes (Signed)
 PATIENT CHECK-IN and HEALTH RISK ASSESSMENT QUESTIONNAIRE:  -completed by phone/video for upcoming Medicare Preventive Visit  Pre-Visit Check-in: 1)Vitals (height, wt, BP, etc) - record in vitals section for visit on day of visit Request home vitals (wt, BP, etc.) and enter into vitals, THEN update Vital Signs SmartPhrase below at the top of the HPI. See below.  2)Review and Update Medications, Allergies PMH, Surgeries, Social history in Epic 3)Hospitalizations in the last year with date/reason? Yes, resp failure - in the spring  4)Review and Update Care Team (patient's specialists) in Epic 5) Complete PHQ9 in Epic  6) Complete Fall Screening in Epic 7)Review all Health Maintenance Due and order under PCP if not done.  Medicare Wellness Patient Questionnaire:  Answer theses question about your habits: How often do you have a drink containing alcohol? none How many packs a day do/did you smoke?  Used to smoke lightly - has not smoked in over 2 years - a few here and there On average, how many days per week do you engage in moderate to strenuous exercise (like a brisk walk)? Sometimes, but rarely - cardiologist has said to walk as much as he can.  Diet: wife cooks a home, proteins, veggies, drinks water and orange juice, coffee   Answer theses question about your everyday activities: Can you perform most household chores? No - wife does all the chores Are you deaf or have significant trouble hearing? no Do you feel that you have a problem with memory? Is on medicine for dementia - seems stable Do you feel safe at home? yes Last dentist visit? Goes on regular basis 8. Do you have any difficulty performing your everyday activities? Yes - wife Are you having any difficulty walking, taking medications on your own, and or difficulty managing daily home needs? Yes - wife does Do you have difficulty walking or climbing stairs? Do you have difficulty dressing or bathing?y Do you have difficulty  doing errands alone such as visiting a doctor's office or shopping?y Do you currently have any difficulty preparing food and eating?y Do you currently have any difficulty using the toilet?y Do you have any difficulty managing your finances?y Do you have any difficulties with housekeeping of managing your housekeeping?y   Do you have Advanced Directives in place (Living Will, Healthcare Power or Attorney)? yes   Last eye Exam and location? Sees diabetic eye doctor - wife is going to schedule f/u - she forgets name at the moment   Do you currently use prescribed or non-prescribed narcotic or opioid pain medications? No - took off list as no longer takes - was for injury in the past     ----------------------------------------------------------------------------------------------------------------------------------------------------------------------------------------------------------------------  Because this visit was a virtual/telehealth visit, some criteria may be missing or patient reported. Any vitals not documented were not able to be obtained and vitals that have been documented are patient reported.    MEDICARE ANNUAL PREVENTIVE CARE VISIT WITH PROVIDER (Welcome to Medicare, initial annual wellness or annual wellness exam)  Virtual Visit via Video Note  I connected with Norleen FORBES Loving on 03/11/23  by phone and verified that I am speaking with the correct person using two identifiers.Wife prefers phone.  Location patient: home Location provider:work or home office Persons participating in the virtual visit: patient, provider, wife  Concerns and/or follow up today: everything stable, no concerns today. Saw his cardiologist yesterday and is doing well, lasix  decreased a little.    See HM section in Epic for other details of completed  HM.    ROS: negative for report of fevers, unintentional weight loss, vision changes, vision loss, hearing loss or change, chest pain, sob,  hemoptysis, melena, hematochezia, hematuria, bleeding or bruising, thoughts of suicide or self harm  Patient-completed extensive health risk assessment - reviewed and discussed with the patient: See Health Risk Assessment completed with patient prior to the visit either above or in recent phone note. This was reviewed in detailed with the patient today and appropriate recommendations, orders and referrals were placed as needed per Summary below and patient instructions.   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 2007 Inf STEMI s/p Vision BMS  2 to RCA;  b. 2009 ISR Prox RCA Stent-->with DES;  c. 09/2015 PCI: LM nl, LAD 30p/m, D1 90, LCX nl, OM1 small, 80, OM2 90 (2.5x14 Biofreedom stent), OM3 small, RCA 30p ISR, 35m ISR, 43m, 85d, EF 50-55%.   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 On Eliquis  (CHA2DS2VASc = 5).  Rate controlled w/ BB and CCB.   Chronic diastolic CHF (congestive heart failure) (HCC)    a. 09/2015 Echo: EF 55-60%, mildly dil LA.   CKD (chronic kidney disease), stage III (HCC)    they mentioned this to the patient but they would work on it later after the aneurysm   COLONIC POLYPS, HX OF 11/02/2006   COPD (chronic obstructive pulmonary disease) (HCC)    DIABETES MELLITUS, TYPE II 10/29/2006   Diverticulosis    History of tobacco abuse    a. Quit 09/2015.   HYPERLIPIDEMIA  10/29/2006   Hypertension    Hypertensive heart disease with heart failure (HCC)    Pneumonia 09/2015   thelbert 07/31/2016   STEMI (ST elevation myocardial infarction) [facility]) 2007   thelbert 07/31/2016    Past Surgical History:  Procedure Laterality Date   ABDOMINAL AORTIC ENDOVASCULAR STENT GRAFT N/A 07/31/2016   Procedure: ABDOMINAL AORTIC ENDOVASCULAR STENT GRAFT;  Surgeon: Serene Gaile ORN, MD;  Location: [facility] OR;  Service: Vascular;  Laterality: N/A;   CARDIAC CATHETERIZATION N/A 09/26/2015   Procedure: Left Heart Cath and Coronary Angiography;  Surgeon: Peter M Jordan, MD;  Location: [facility] INVASIVE CV LAB;  Service: Cardiovascular;  Laterality: N/A;   CARDIAC CATHETERIZATION N/A 09/26/2015   Procedure: Coronary Stent Intervention;  Surgeon: Peter M Jordan, MD;  Location[facility] INVASIVE CV LAB;  Service: Cardiovascular;  Laterality: N/A;   CATARACT EXTRACTION Bilateral    COLONOSCOPY     CORONARY ANGIOPLASTY WITH STENT PLACEMENT  2007   Inferior STEMI 2007 - RCA PCI with 2 Vision BMS; Abnormal Myoview in 2009 - pRCA ins-stent restenosis & unstented disease - DES PCI Promus 3.0 x 20 mm (3.5 mm)   ENDOVASCULAR REPAIR/STENT GRAFT  07/31/2016   INCISION AND DRAINAGE PERIRECTAL ABSCESS N/A 11/06/2015   Procedure: IRRIGATION AND DEBRIDEMENT PERIRECTAL ABSCESS, POSSIBLE HEMORRHOIDECTOMY;  Surgeon: Vicenta Poli, MD;  Location: [facility] OR;  Service: General;  Laterality: N/A;   ORIF SHOULDER FRACTURE Right    fell out of back end of a truck    Social History   Socioeconomic History   Marital status: Married    Spouse name: Not on file   Number of children: 5   Years of education: Not on file   Highest education level: Not on file  Occupational History   Occupation: [employer]  Tobacco Use   Smoking status: Former    Current packs/day: 0.00    Average packs/day: 1 pack/day for 49.0 years (49.0 ttl pk-yrs)    Types: Cigarettes    Start date: 09/08/1966    Quit date: 09/08/2015    Years since  quitting: 7.5    Passive exposure: Current   Smokeless tobacco: Never   Tobacco comments:    passive smoker from 2017-2024  Vaping Use   Vaping status: Never Used  Substance and Sexual Activity   Alcohol use: No    Alcohol/week: 0.0 standard drinks of alcohol   Drug use: No   Sexual activity: Yes    Birth control/protection: None  Other Topics Concern   Not on file  Social History Narrative   Not on file   Social Drivers of Health   Financial Resource Strain: Medium Risk (09/23/2020)   Overall Financial Resource Strain (CARDIA)    Difficulty of Paying Living Expenses: Somewhat hard  Food Insecurity: No Food Insecurity (05/05/2022)   Hunger Vital Sign    Worried About Running Out of Food in the Last Year: Never true    Ran Out of Food in the Last Year: Never true  Transportation Needs: No Transportation Needs (05/05/2022)   PRAPARE - Administrator, Civil Service (Medical): No    Lack of Transportation (Non-Medical): No  Physical Activity: Sufficiently Active (06/08/2019)   Exercise Vital Sign    Days of Exercise per Week: 3 days    Minutes of Exercise per Session: 60 min  Stress: Not on file  Social Connections: Not on file  Intimate Partner Violence: Not At Risk (04/30/2022)   Humiliation, Afraid, Rape, and Kick questionnaire    Fear of Current or Ex-Partner: No    Emotionally Abused: No    Physically Abused: No    Sexually Abused: No    Family History  Problem Relation Age of Onset   Heart attack Mother    Diabetes Mother    Heart attack Father    Hypertension Father    Heart attack Brother    Diabetes Brother     Current Outpatient Medications on File Prior to Visit  Medication Sig Dispense Refill   ACCU-CHEK AVIVA PLUS test strip CHECK BLOOD SUGAR ONCE DAILY. DX E11.51 100 strip 2   apixaban  (ELIQUIS ) 2.5 MG TABS tablet Take 1 tablet (2.5 mg total) by mouth 2 (two) times daily. 180 tablet 1   atorvastatin  (LIPITOR) 40 MG tablet Take 1 tablet (40 mg  total) by mouth daily. 90 tablet 1   Budeson-Glycopyrrol-Formoterol  (BREZTRI  AEROSPHERE) 160-9-4.8 MCG/ACT AERO Inhale 2 puffs into the lungs in the morning and at bedtime. 10.7 g 12   Continuous Blood Gluc Receiver (DEXCOM G6 RECEIVER) DEVI CHECK VLOOD SUGAR AS DIRECTED AS DIRECTED 1 each 0   Continuous Blood Gluc Sensor (DEXCOM G6 SENSOR) MISC APPLY 1 SENSOR EVERY 10 DAYS 3 each 2   Continuous Blood Gluc Transmit (DEXCOM G6 TRANSMITTER) MISC APPLY TO SENSOR EVERY 90 DAYS 1 each 0   Cyanocobalamin  (B-12 COMPLIANCE INJECTION IJ) Inject  as directed every 30 (thirty) days.     dextromethorphan  (DELSYM ) 30 MG/5ML liquid Take 5 mLs (30 mg total) by mouth 2 (two) times daily as needed for cough. 89 mL 1   diltiazem  (CARDIZEM  CD) 360 MG 24 hr capsule Take 1 capsule (360 mg total) by mouth daily. 90 capsule 1   folic acid  (FOLVITE ) 1 MG tablet Take 1 tablet (1 mg total) by mouth daily. 30 tablet 1   furosemide  (LASIX ) 80 MG tablet Take 0.5 tablets (40 mg total) by mouth daily. 90 tablet 1   ipratropium-albuterol  (DUONEB) 0.5-2.5 (3) MG/3ML SOLN Take 3 mLs by nebulization every 4 (four) hours as needed (wheezing, shortness of breath, cough). 360 mL 1   isosorbide  mononitrate (IMDUR ) 30 MG 24 hr tablet Take 1 tablet (30 mg total) by mouth daily. 90 tablet 3   levalbuterol  (XOPENEX  HFA) 45 MCG/ACT inhaler INHALE 1 TO 2 PUFFS BY MOUTH EVERY 6 HOURS AS NEEDED FOR WHEEZE [id number] 15 each 3   metFORMIN  (GLUCOPHAGE ) 1000 MG tablet TAKE ONE TABLET BY MOUTH AT BREAKFAST AND AT BEDTIME 180 tablet 1   metoprolol  succinate (TOPROL  XL) 50 MG 24 hr tablet Take 1 tablet (50 mg total) by mouth daily. Take with or immediately following a meal. 90 tablet 3   nitroGLYCERIN  (NITROSTAT ) 0.4 MG SL tablet Place 1 tablet (0.4 mg total) under the tongue every 5 (five) minutes as needed for chest pain. X 3 doses 25 tablet 4   polyethylene glycol (MIRALAX  / GLYCOLAX ) packet Take 17 g by mouth daily as needed for mild constipation.      QUEtiapine  (SEROQUEL ) 25 MG tablet TAKE 0.5 TABLETS BY MOUTH AT BEDTIME. 45 tablet 2   rivastigmine  (EXELON ) 1.5 MG capsule Take 1 capsule (1.5 mg total) by mouth 2 (two) times daily. 60 capsule 6   TRESIBA  FLEXTOUCH 100 UNIT/ML FlexTouch Pen Inject 32 units into THE SKIN AT bedtime 15 mL 2   No current facility-administered medications on file prior to visit.    Allergies  Allergen Reactions   Contrast Media [Iodinated Contrast Media] Anaphylaxis       Physical Exam Vitals requested from patient and listed below if patient had equipment and was able to obtain at home for this virtual visit: There were no vitals filed for this visit. Estimated body mass index is 24.24 kg/m as calculated from the following:   Height as of 03/10/23: 5' 8 (1.727 m).   Weight as of 03/10/23: 159 lb 6.4 oz (72.3 kg).  EKG (optional): deferred due to virtual visit  GENERAL: alert, oriented, no acute distress detected; full vision exam deferred due to pandemic and/or virtual encounter  PSYCH/NEURO: pleasant and cooperative  Flowsheet Row Office Visit from 09/22/2022 in [facility]  PHQ-9 Total Score 12           03/11/2023   12:55 PM 02/11/2023    9:12 AM 09/22/2022   11:34 AM 05/14/2022    3:51 PM 01/21/2022    4:00 PM  Depression screen PHQ 2/9  Decreased Interest 0 0 0 0 0  Down, Depressed, Hopeless 0 1 1 0 0  PHQ - 2 Score 0 1 1 0 0  Altered sleeping   1 3 0  Tired, decreased energy   3 1 0  Change in appetite   1 0 0  Feeling bad or failure about yourself    1 0 0  Trouble concentrating   3 0 0  Moving slowly  or fidgety/restless   1 0 0  Suicidal thoughts   1 0 0  PHQ-9 Score   12 4 0  Difficult doing work/chores   Somewhat difficult  Not difficult at all       01/21/2022    4:01 PM 05/14/2022    3:51 PM 09/22/2022   10:58 AM 02/11/2023    9:12 AM 03/11/2023   12:54 PM  Fall Risk  Falls in the past year? 0 0 1 1 1   Was there an injury with Fall? 0 0 1 1  0  Fall Risk Category Calculator 0 0 3 3 1   Fall Risk Category (Retired) Low      (RETIRED) Patient Fall Risk Level Low fall risk      Patient at Risk for Falls Due to No Fall Risks No Fall Risks     Fall risk Follow up Falls evaluation completed Falls evaluation completed Falls evaluation completed Falls evaluation completed Falls evaluation completed;Education provided     SUMMARY AND PLAN:  Encounter for Medicare annual wellness exam   Discussed applicable health maintenance/preventive health measures and advised and referred or ordered per patient preferences: -wife plans to schedule eye exam and diabetes follow up with Dr. Theophilus for POC [id number], foot exam; sne tmessage to schedulers to assist -discussed vaccines due and she plans to take him to get tat the pharmacy Health Maintenance  Topic Date Due   Zoster Vaccines- Shingrix (1 of 2) Never done   OPHTHALMOLOGY EXAM  03/22/2018   FOOT EXAM  01/19/2019   Diabetic kidney evaluation - Urine ACR  05/20/2022   INFLUENZA VACCINE  09/03/2022   COVID-19 Vaccine (4 - 2024-25 season) 10/04/2022   HEMOGLOBIN A1C  10/30/2022   Diabetic kidney evaluation - eGFR measurement  03/09/2024   Medicare Annual Wellness (AWV)  03/10/2024   DTaP/Tdap/Td (2 - Td or Tdap) 01/29/2033   Pneumonia Vaccine 11+ Years old  Completed   HPV VACCINES  Aged Out     Education and counseling on the following was provided based on the above review of health and a plan/checklist for the patient, along with additional information discussed, was provided for the patient in the patient instructions :  -Provided counseling and plan for increased risk of falling if applicable per above screening. Provided safe balance exercises that can be done at home to improve balance and discussed exercise guidelines for adults with include balance exercises at least 3 days per week.  -Advised and counseled on a healthy lifestyle - including the importance of a healthy diet,  regular physical activity, social connections -Reviewed patient's current diet.  A summary of a healthy diet was provided in the Patient Instructions.  -reviewed patient's current physical activity level and discussed exercise guidelines for adults. Discussed ideas for safe exercise at home to assist in meeting exercise guideline recommendations in a safe and healthy way.  -Advise yearly dental visits at minimum and regular eye exams   Follow up: see patient instructions   Patient Instructions  I really enjoyed getting to talk with you today! I am available on Tuesdays and Thursdays for virtual visits if you have any questions or concerns, or if I can be of any further assistance.   CHECKLIST FROM ANNUAL WELLNESS VISIT:  -Follow up (please call to schedule if not scheduled after visit):   -yearly for annual wellness visit with primary care office  Here is a list of your preventive care/health maintenance measures and the plan for each if  any are due:  PLAN For any measures below that may be due:  -please schedule eye exam -please schedule diabetes follow up visit for diabetes labs and foot exam -can get the vaccines at the pharmacy - please let us  know if you do so that we can update your record  Health Maintenance  Topic Date Due   Zoster Vaccines- Shingrix (1 of 2) Never done   OPHTHALMOLOGY EXAM  03/22/2018   FOOT EXAM  01/19/2019   Diabetic kidney evaluation - Urine ACR  05/20/2022   INFLUENZA VACCINE  09/03/2022   COVID-19 Vaccine (4 - 2024-25 season) 10/04/2022   HEMOGLOBIN A1C  10/30/2022   Diabetic kidney evaluation - eGFR measurement  03/09/2024   Medicare Annual Wellness (AWV)  03/10/2024   DTaP/Tdap/Td (2 - Td or Tdap) 01/29/2033   Pneumonia Vaccine 35+ Years old  Completed   HPV VACCINES  Aged Out    -See a dentist at least yearly  -Get your eyes checked and then per your eye specialist's recommendations  -Other issues addressed today:   -I have included  below further information regarding a healthy whole foods based diet, physical activity guidelines for adults, stress management and opportunities for social connections. I hope you find this information useful.   -----------------------------------------------------------------------------------------------------------------------------------------------------------------------------------------------------------------------------------------------------------    NUTRITION: -eat real food: lots of colorful vegetables (half the plate) and fruits -5-7 servings of vegetables and fruits per day (fresh or steamed is best), exp. 2 servings of vegetables with lunch and dinner and 2 servings of fruit per day. Berries and greens such as kale and collards are great choices.  -consume on a regular basis:  fresh fruits, fresh veggies, fish, nuts, seeds, healthy oils (such as olive oil, avocado oil), whole grains (make sure first ingredient on label contains the word whole), -can eat small amounts of dairy and lean meat (no larger than the palm of your hand), but avoid processed meats such as ham, bacon, lunch meat, etc. -drink water -try to avoid fast food and pre-packaged foods, processed meat, ultra processed foods (donuts, candy, etc.) -most experts advise limiting sodium to < 2300mg  per day, should limit further is any chronic conditions such as high blood pressure, heart disease, diabetes, etc. The American Heart Association advised that < 1500mg  is is ideal -try to avoid foods that contain any ingredients with names you do not recognize  -try to avoid foods with added sugar or sweeteners/sweets  -try to avoid sweet drinks -try to avoid white rice, white bread, pasta (unless whole grain)  EXERCISE GUIDELINES FOR ADULTS: -if you wish to increase your physical activity, do so gradually and with the approval of your doctor -STOP and seek medical care immediately if you have any chest pain, chest  discomfort or trouble breathing when starting or increasing exercise  -move and stretch your body, legs, feet and arms when sitting for long periods -Physical activity guidelines for optimal health in adults: -get at least 150 minutes per week of moderate exercise (can talk, but not sing); this is about 20-30 minutes of sustained activity 5-7 days per week or two 10-15 minute episodes of sustained activity 5-7 days per week -do some muscle building/resistance training at least 2 days per week  -balance exercises 3+ days per week:   Stand somewhere where you have something sturdy to hold onto if you lose balance.    1) lift up on toes, start with 5x per day and work up to 20x   2) stand and lift on  leg straight out to the side so that foot is a few inches of the floor, start with 5x each side and work up to 20x each side   3) stand on one foot, start with 5 seconds each side and work up to 20 seconds on each side  If you need ideas or help with getting more active:  -Silver sneakers https://tools.silversneakers.com  -Walk with a Doc: Http://www.duncan-williams.com/  -try to include resistance (weight lifting/strength building) and balance exercises twice per week: or the following link for ideas: http://castillo-powell.com/  buyducts.dk  STRESS MANAGEMENT: -can try meditating, or just sitting quietly with deep breathing while intentionally relaxing all parts of your body for 5 minutes daily -if you need further help with stress, anxiety or depression please follow up with your primary doctor or contact the wonderful folks at [facility]: [phone]  SOCIAL CONNECTIONS: -options in [place] if you wish to engage in more social and exercise related activities:  -Silver sneakers https://tools.silversneakers.com  -Walk with a Doc: Http://www.duncan-williams.com/  -Check out the [facility]  Active Adults 50+ section on the [place] of [employer] companies (hiking clubs, book clubs, cards and games, chess, exercise classes, aquatic classes and much more) - see the website for details: https://www.[place].gov/departments/parks-recreation/active-adults50  -YouTube has lots of exercise videos for different ages and abilities as well  -[facility] (a variety of indoor and outdoor inperson activities for adults). ([phone]. [address]  -Virtual Online Classes (a variety of topics): see seniorplanet.org or call [phone]  -consider volunteering at a school, hospice center, church, senior center or elsewhere            Chiquita JONELLE Cramp, DO

## 2023-03-11 NOTE — Patient Instructions (Signed)
 I really enjoyed getting to talk with you today! I am available on Tuesdays and Thursdays for virtual visits if you have any questions or concerns, or if I can be of any further assistance.   CHECKLIST FROM ANNUAL WELLNESS VISIT:  -Follow up (please call to schedule if not scheduled after visit):   -yearly for annual wellness visit with primary care office  Here is a list of your preventive care/health maintenance measures and the plan for each if any are due:  PLAN For any measures below that may be due:  -please schedule eye exam -please schedule diabetes follow up visit for diabetes labs and foot exam -can get the vaccines at the pharmacy - please let us  know if you do so that we can update your record  Health Maintenance  Topic Date Due   Zoster Vaccines- Shingrix (1 of 2) Never done   OPHTHALMOLOGY EXAM  03/22/2018   FOOT EXAM  01/19/2019   Diabetic kidney evaluation - Urine ACR  05/20/2022   INFLUENZA VACCINE  09/03/2022   COVID-19 Vaccine (4 - 2024-25 season) 10/04/2022   HEMOGLOBIN A1C  10/30/2022   Diabetic kidney evaluation - eGFR measurement  03/09/2024   Medicare Annual Wellness (AWV)  03/10/2024   DTaP/Tdap/Td (2 - Td or Tdap) 01/29/2033   Pneumonia Vaccine 39+ Years old  Completed   HPV VACCINES  Aged Out    -See a dentist at least yearly  -Get your eyes checked and then per your eye specialist's recommendations  -Other issues addressed today:   -I have included below further information regarding a healthy whole foods based diet, physical activity guidelines for adults, stress management and opportunities for social connections. I hope you find this information useful.   -----------------------------------------------------------------------------------------------------------------------------------------------------------------------------------------------------------------------------------------------------------    NUTRITION: -eat real food: lots of  colorful vegetables (half the plate) and fruits -5-7 servings of vegetables and fruits per day (fresh or steamed is best), exp. 2 servings of vegetables with lunch and dinner and 2 servings of fruit per day. Berries and greens such as kale and collards are great choices.  -consume on a regular basis:  fresh fruits, fresh veggies, fish, nuts, seeds, healthy oils (such as olive oil, avocado oil), whole grains (make sure first ingredient on label contains the word whole), -can eat small amounts of dairy and lean meat (no larger than the palm of your hand), but avoid processed meats such as ham, bacon, lunch meat, etc. -drink water -try to avoid fast food and pre-packaged foods, processed meat, ultra processed foods (donuts, candy, etc.) -most experts advise limiting sodium to < 2300mg  per day, should limit further is any chronic conditions such as high blood pressure, heart disease, diabetes, etc. The American Heart Association advised that < 1500mg  is is ideal -try to avoid foods that contain any ingredients with names you do not recognize  -try to avoid foods with added sugar or sweeteners/sweets  -try to avoid sweet drinks -try to avoid white rice, white bread, pasta (unless whole grain)  EXERCISE GUIDELINES FOR ADULTS: -if you wish to increase your physical activity, do so gradually and with the approval of your doctor -STOP and seek medical care immediately if you have any chest pain, chest discomfort or trouble breathing when starting or increasing exercise  -move and stretch your body, legs, feet and arms when sitting for long periods -Physical activity guidelines for optimal health in adults: -get at least 150 minutes per week of moderate exercise (can talk, but not sing); this is  about 20-30 minutes of sustained activity 5-7 days per week or two 10-15 minute episodes of sustained activity 5-7 days per week -do some muscle building/resistance training at least 2 days per week  -balance  exercises 3+ days per week:   Stand somewhere where you have something sturdy to hold onto if you lose balance.    1) lift up on toes, start with 5x per day and work up to 20x   2) stand and lift on leg straight out to the side so that foot is a few inches of the floor, start with 5x each side and work up to 20x each side   3) stand on one foot, start with 5 seconds each side and work up to 20 seconds on each side  If you need ideas or help with getting more active:  -Silver sneakers https://tools.silversneakers.com  -Walk with a Doc: Http://www.duncan-williams.com/  -try to include resistance (weight lifting/strength building) and balance exercises twice per week: or the following link for ideas: http://castillo-powell.com/  buyducts.dk  STRESS MANAGEMENT: -can try meditating, or just sitting quietly with deep breathing while intentionally relaxing all parts of your body for 5 minutes daily -if you need further help with stress, anxiety or depression please follow up with your primary doctor or contact the wonderful folks at [facility]: [phone]  SOCIAL CONNECTIONS: -options in [place] if you wish to engage in more social and exercise related activities:  -Silver sneakers https://tools.silversneakers.com  -Walk with a Doc: Http://www.duncan-williams.com/  -Check out the [facility] Active Adults 50+ section on the [place] of [employer] companies (hiking clubs, book clubs, cards and games, chess, exercise classes, aquatic classes and much more) - see the website for details: https://www.[place].gov/departments/parks-recreation/active-adults50  -YouTube has lots of exercise videos for different ages and abilities as well  -[facility] (a variety of indoor and outdoor inperson activities for adults). (762)112-7888. [address].  -Virtual Online Classes (a  variety of topics): see seniorplanet.org or call [phone]  -consider volunteering at a school, hospice center, church, senior center or elsewhere

## 2023-03-14 ENCOUNTER — Other Ambulatory Visit: Payer: Self-pay | Admitting: Internal Medicine

## 2023-03-14 DIAGNOSIS — E1151 Type 2 diabetes mellitus with diabetic peripheral angiopathy without gangrene: Secondary | ICD-10-CM

## 2023-03-25 ENCOUNTER — Ambulatory Visit: Payer: Medicare Other | Admitting: Internal Medicine

## 2023-03-29 ENCOUNTER — Other Ambulatory Visit: Payer: Self-pay | Admitting: Neurology

## 2023-03-29 ENCOUNTER — Other Ambulatory Visit: Payer: Self-pay | Admitting: Internal Medicine

## 2023-03-29 DIAGNOSIS — E1151 Type 2 diabetes mellitus with diabetic peripheral angiopathy without gangrene: Secondary | ICD-10-CM

## 2023-03-29 DIAGNOSIS — I10 Essential (primary) hypertension: Secondary | ICD-10-CM

## 2023-03-29 DIAGNOSIS — E785 Hyperlipidemia, unspecified: Secondary | ICD-10-CM

## 2023-03-31 ENCOUNTER — Encounter: Payer: Self-pay | Admitting: Internal Medicine

## 2023-03-31 ENCOUNTER — Ambulatory Visit (INDEPENDENT_AMBULATORY_CARE_PROVIDER_SITE_OTHER): Payer: Medicare Other | Admitting: Internal Medicine

## 2023-03-31 VITALS — BP 120/84 | HR 76 | Temp 97.4°F | Wt 161.0 lb

## 2023-03-31 DIAGNOSIS — E1151 Type 2 diabetes mellitus with diabetic peripheral angiopathy without gangrene: Secondary | ICD-10-CM

## 2023-03-31 DIAGNOSIS — Z9861 Coronary angioplasty status: Secondary | ICD-10-CM

## 2023-03-31 DIAGNOSIS — I2510 Atherosclerotic heart disease of native coronary artery without angina pectoris: Secondary | ICD-10-CM | POA: Diagnosis not present

## 2023-03-31 DIAGNOSIS — E785 Hyperlipidemia, unspecified: Secondary | ICD-10-CM

## 2023-03-31 DIAGNOSIS — I10 Essential (primary) hypertension: Secondary | ICD-10-CM | POA: Diagnosis not present

## 2023-03-31 LAB — POCT GLYCOSYLATED HEMOGLOBIN (HGB A1C): Hemoglobin A1C: 5.9 % — AB (ref 4.0–5.6)

## 2023-03-31 NOTE — Progress Notes (Signed)
 Established Patient Office Visit     CC/Reason for Visit: Follow-up chronic conditions  HPI: Jason Fisher is a 82 y.o. male who is coming in today for the above mentioned reasons. Past Medical History is significant for: Hypertension, hyperlipidemia, type 2 diabetes, coronary artery disease, heart failure.  Regarding paroxysmal Paxlovid, OSA, COPD, A-fib, stage III chronic kidney disease.  He is here today with his wife.  No acute issues to discuss.  He is feeling well.   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 2007 Inf STEMI s/p Vision BMS  2 to RCA;  b. 2009 ISR Prox RCA Stent-->with DES;  c. 09/2015 PCI: LM nl, LAD 30p/m, D1 90, LCX nl, OM1 small, 80, OM2 90 (2.5x14 Biofreedom stent), OM3 small, RCA 30p ISR, 80m ISR, 47m, 85d, EF 50-55%.   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 On Eliquis (CHA2DS2VASc = 5).  Rate controlled w/ BB and CCB.   Chronic diastolic CHF (congestive heart failure) (HCC)    a. 09/2015 Echo: EF 55-60%, mildly dil LA.   CKD (chronic kidney disease), stage III (HCC)    they mentioned this to the patient but they would work on it later after the aneurysm   COLONIC POLYPS, HX OF 11/02/2006   COPD (chronic obstructive pulmonary disease) (HCC)    DIABETES MELLITUS, TYPE II 10/29/2006   Diverticulosis    History of tobacco abuse    a. Quit 09/2015.   HYPERLIPIDEMIA 10/29/2006   Hypertension    Hypertensive heart disease with heart failure (HCC)    Pneumonia 09/2015   [name] 07/31/2016   STEMI (ST elevation myocardial infarction) [facility]) 2007   [name] 07/31/2016    Past Surgical History:  Procedure Laterality Date   ABDOMINAL AORTIC ENDOVASCULAR STENT GRAFT N/A 07/31/2016   Procedure: ABDOMINAL AORTIC ENDOVASCULAR STENT GRAFT;  Surgeon: Nada Libman, MD;  Location: [facility] OR;   Service: Vascular;  Laterality: N/A;   CARDIAC CATHETERIZATION N/A 09/26/2015   Procedure: Left Heart Cath and Coronary Angiography;  Surgeon: Peter M [place]az[place], MD;  Location: [facility] INVASIVE CV LAB;  Service: Cardiovascular;  Laterality: N/A;   CARDIAC CATHETERIZATION N/A 09/26/2015   Procedure: Coronary Stent Intervention;  Surgeon: Peter M [place], MD;  Location: [facility] INVASIVE CV LAB;  Service: Cardiovascular;  Laterality: N/A;   CATARACT EXTRACTION Bilateral    COLONOSCOPY     CORONARY ANGIOPLASTY WITH STENT PLACEMENT  2007   Inferior STEMI 2007 - RCA PCI with 2 Vision BMS; Abnormal Myoview in 2009 - pRCA ins-stent restenosis & unstented disease - DES PCI Promus 3.0 x 20 mm (3.5 mm)   ENDOVASCULAR REPAIR/STENT GRAFT  07/31/2016   INCISION AND DRAINAGE PERIRECTAL ABSCESS N/A 11/06/2015   Procedure: IRRIGATION AND DEBRIDEMENT PERIRECTAL ABSCESS, POSSIBLE HEMORRHOIDECTOMY;  Surgeon: Abigail Miyamoto, MD;  Location: [facility] OR;  Service: General;  Laterality: N/A;   ORIF SHOULDER FRACTURE Right    "fell out of back end of a truck"    Social History:  reports that he quit smoking about 7 years ago. His smoking use included cigarettes. He started smoking about 56 years ago. He has a 49 pack-year smoking history. He has been exposed to tobacco smoke. He has never used smokeless tobacco. He reports that he does not drink alcohol and does not use  drugs.  Allergies: Allergies  Allergen Reactions   Contrast Media [Iodinated Contrast Media] Anaphylaxis    Family History:  Family History  Problem Relation Age of Onset   Heart attack Mother    Diabetes Mother    Heart attack Father    Hypertension Father    Heart attack Brother    Diabetes Brother      Current Outpatient Medications:    ACCU-CHEK AVIVA PLUS test strip, CHECK BLOOD SUGAR ONCE DAILY. DX E11.51, Disp: 100 strip, Rfl: 2   apixaban (ELIQUIS) 2.5 MG TABS tablet, Take 1 tablet (2.5 mg total) by mouth 2 (two) times daily., Disp: 180 tablet,  Rfl: 1   atorvastatin (LIPITOR) 40 MG tablet, TAKE 1 TABLET BY MOUTH EVERY DAY, Disp: 90 tablet, Rfl: 0   Budeson-Glycopyrrol-Formoterol (BREZTRI AEROSPHERE) 160-9-4.8 MCG/ACT AERO, Inhale 2 puffs into the lungs in the morning and at bedtime., Disp: 10.7 g, Rfl: 12   Continuous Blood Gluc Receiver (DEXCOM G6 RECEIVER) DEVI, CHECK VLOOD SUGAR AS DIRECTED AS DIRECTED, Disp: 1 each, Rfl: 0   Continuous Blood Gluc Sensor (DEXCOM G6 SENSOR) MISC, APPLY 1 SENSOR EVERY 10 DAYS, Disp: 3 each, Rfl: 2   Continuous Blood Gluc Transmit (DEXCOM G6 TRANSMITTER) MISC, APPLY TO SENSOR EVERY 90 DAYS, Disp: 1 each, Rfl: 0   Cyanocobalamin (B-12 COMPLIANCE INJECTION IJ), Inject as directed every 30 (thirty) days., Disp: , Rfl:    dextromethorphan (DELSYM) 30 MG/5ML liquid, Take 5 mLs (30 mg total) by mouth 2 (two) times daily as needed for cough., Disp: 89 mL, Rfl: 1   diltiazem (CARDIZEM CD) 360 MG 24 hr capsule, TAKE 1 CAPSULE BY MOUTH EVERY DAY, Disp: 90 capsule, Rfl: 0   folic acid (FOLVITE) 1 MG tablet, Take 1 tablet (1 mg total) by mouth daily., Disp: 30 tablet, Rfl: 1   furosemide (LASIX) 80 MG tablet, Take 0.5 tablets (40 mg total) by mouth daily., Disp: 90 tablet, Rfl: 1   insulin degludec (TRESIBA FLEXTOUCH) 100 UNIT/ML FlexTouch Pen, INJECT 32 UNITS INTO THE SKIN AT BEDTIME, Disp: 15 mL, Rfl: 2   ipratropium-albuterol (DUONEB) 0.5-2.5 (3) MG/3ML SOLN, Take 3 mLs by nebulization every 4 (four) hours as needed (wheezing, shortness of breath, cough)., Disp: 360 mL, Rfl: 1   isosorbide mononitrate (IMDUR) 30 MG 24 hr tablet, Take 1 tablet (30 mg total) by mouth daily., Disp: 90 tablet, Rfl: 3   levalbuterol (XOPENEX HFA) 45 MCG/ACT inhaler, INHALE 1 TO 2 PUFFS BY MOUTH EVERY 6 HOURS AS NEEDED FOR WHEEZE [id number], Disp: 15 each, Rfl: 3   metFORMIN (GLUCOPHAGE) 1000 MG tablet, TAKE ONE TABLET BY MOUTH AT BREAKFAST AND AT BEDTIME, Disp: 180 tablet, Rfl: 0   metoprolol succinate (TOPROL XL) 50 MG 24 hr tablet, Take 1  tablet (50 mg total) by mouth daily. Take with or immediately following a meal., Disp: 90 tablet, Rfl: 3   nitroGLYCERIN (NITROSTAT) 0.4 MG SL tablet, Place 1 tablet (0.4 mg total) under the tongue every 5 (five) minutes as needed for chest pain. X 3 doses, Disp: 25 tablet, Rfl: 4   polyethylene glycol (MIRALAX / GLYCOLAX) packet, Take 17 g by mouth daily as needed for mild constipation., Disp: , Rfl:    QUEtiapine (SEROQUEL) 25 MG tablet, TAKE 0.5 TABLETS BY MOUTH AT BEDTIME., Disp: 45 tablet, Rfl: 2   rivastigmine (EXELON) 1.5 MG capsule, TAKE 1 CAPSULE (1.5 MG TOTAL) BY MOUTH TWICE A DAY, Disp: 180 capsule, Rfl: 2  Review of Systems:  Negative unless indicated  in HPI.   Physical Exam: Vitals:   03/31/23 0856  BP: 120/84  Pulse: 76  Temp: (!) 97.4 F (36.3 C)  TempSrc: Oral  SpO2: 93%  Weight: 161 lb (73 kg)    Body mass index is 24.48 kg/m.   Physical Exam Vitals reviewed.  Constitutional:      Appearance: Normal appearance.  HENT:     Head: Normocephalic and atraumatic.  Eyes:     Conjunctiva/sclera: Conjunctivae normal.  Cardiovascular:     Rate and Rhythm: Normal rate and regular rhythm.  Pulmonary:     Effort: Pulmonary effort is normal.     Breath sounds: Normal breath sounds.  Skin:    General: Skin is warm and dry.  Neurological:     General: No focal deficit present.     Mental Status: He is alert and oriented to person, place, and time.  Psychiatric:        Mood and Affect: Mood normal.        Behavior: Behavior normal.        Thought Content: Thought content normal.        Judgment: Judgment normal.      Impression and Plan:  DM (diabetes mellitus), type 2 with peripheral vascular complications (HCC) -     Microalbumin / creatinine urine ratio; Future -     POCT glycosylated hemoglobin (Hb A1C)  Dyslipidemia  Essential hypertension  CAD S/P percutaneous coronary angioplasty   -Blood pressure is fairly well-controlled on current. -LDL is  fairly well-controlled. -A1c of 5.9 demonstrates excellent diabetic management. -His heart failure is stable.  Time spent:31 minutes reviewing chart, interviewing and examining patient and formulating plan of care.     Chaya Jan, MD [facility]

## 2023-04-01 ENCOUNTER — Ambulatory Visit: Payer: Medicare Other | Admitting: Emergency Medicine

## 2023-04-01 ENCOUNTER — Encounter: Payer: Self-pay | Admitting: Emergency Medicine

## 2023-04-01 VITALS — BP 177/66 | HR 78 | Temp 97.0°F | Ht 69.0 in | Wt 162.6 lb

## 2023-04-01 DIAGNOSIS — J42 Unspecified chronic bronchitis: Secondary | ICD-10-CM

## 2023-04-01 DIAGNOSIS — Z9981 Dependence on supplemental oxygen: Secondary | ICD-10-CM | POA: Diagnosis not present

## 2023-04-01 DIAGNOSIS — J9611 Chronic respiratory failure with hypoxia: Secondary | ICD-10-CM

## 2023-04-01 MED ORDER — BREZTRI AEROSPHERE 160-9-4.8 MCG/ACT IN AERO: 2.0000 | INHALATION_SPRAY | Freq: Two times a day (BID) | RESPIRATORY_TRACT | Status: DC

## 2023-04-01 NOTE — Assessment & Plan Note (Signed)
 Wear your oxygen at 2-4 L/min depending on your level of exertion.

## 2023-04-01 NOTE — Assessment & Plan Note (Signed)
 Please continue your Breztri 2 puffs twice a day.  Rinse and gargle after using.  This medication is more costly until you make sure insurance deductible.  If it remains too expensive to obtain then please let us know and we can consider alternatives that may be less expensive.  Hopefully once you meet your insurance deductible we will be able to get it reliably.  Will give you samples today to help defray cost Keep albuterol available to use 2 puffs up to every 4 hours if needed for shortness of breath, chest tightness, wheezing. Flu shot is up-to-date Follow-up with APP in 6 months Follow Dr. Delton Coombes in 12 months or sooner if you have any problems.

## 2023-04-01 NOTE — Progress Notes (Signed)
 Subjective:    Patient ID: Jason Fisher, male    DOB: [birth year], 83 y.o.   MRN: [id number]  COPD His past medical history is significant for COPD.     ROV 09/01/22 --83 year old man with a history of very severe COPD.  He also has hypertension and chronic A-fib with diastolic CHF, diabetes, CAD.  I have followed him for his COPD and also pulmonary nodules (we had stopped following serial imaging).  He was last seen in our office in April 2024 after an admission for pneumonia and acute exacerbation in March.  A CT chest at that time showed an ill-defined patchy right lung nodular opacity.  His CT chest 06/25/2022 showed near complete resolution of the right lung opacity, consistent with resolved pneumonia.  There was some adherent mucus in the left mainstem and dependent left lower lobe with some bronchial thickening and mucoid impaction as well as a small left pleural effusion. Has been managed on Stiolto. He was seen in the [facility] emergency department 08/29/2022 for shortness of breath that woke him from sleep, improved with steroids, nebulized BD.  He was treated with corticosteroids, antibiotics and discharged out. Apparently he was hypertensive at the time. Today he reports that he is improving, not quite back to baseline. He wears O2 with longer exertion, knows to keep SpO2 > 90%.   ROV 04/01/2023 --Mr. Swim is 87 with a history of very severe COPD.  Also hypertension, chronic A-fib with diastolic CHF, diabetes and coronary disease.  He has stable pulmonary nodules noted on serial imaging.  Most recently we have been managing him with Markus Daft but he reports that it will be cost prohibitive.  He has had 2 COPD flares in the last year.  He is on supplemental oxygen, 4 L/min POC with exertion, but he does not wear frequently - does use it when he walks a distance or goes shopping. The Markus Daft was over $300 last time due to medication deductible.  Not smoking.    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 2007 Inf STEMI s/p Vision BMS  2 to RCA;  b. 2009 ISR Prox RCA Stent-->with DES;  c. 09/2015 PCI: LM nl, LAD 30p/m, D1 90, LCX nl, OM1 small, 80, OM2 90 (2.5x14 Biofreedom stent), OM3 small, RCA 30p ISR, 84m ISR, 59m, 85d, EF 50-55%.   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 On Eliquis (CHA2DS2VASc = 5).  Rate controlled w/ BB and CCB.   Chronic diastolic CHF (congestive heart failure) (HCC)    a. 09/2015 Echo: EF 55-60%, mildly dil LA.   CKD (chronic kidney disease), stage III (HCC)    they mentioned this to the patient but they would work on it later after the aneurysm   COLONIC POLYPS, HX OF 11/02/2006   COPD (chronic obstructive pulmonary disease) (HCC)    DIABETES MELLITUS, TYPE II 10/29/2006   Diverticulosis    History of tobacco abuse    a. Quit 09/2015.   HYPERLIPIDEMIA 10/29/2006   Hypertension    Hypertensive heart disease with heart failure (HCC)    Pneumonia 09/2015   [name] 07/31/2016   STEMI (ST elevation myocardial infarction) [facility]) 2007   [name] 07/31/2016         Objective:   Physical Exam  Vitals:   04/01/23 1009  BP: (!) 177/66  Pulse: 78  Temp: (!) 97 F (36.1 C)  TempSrc: Temporal  SpO2: 96%  Weight: 162 lb 9.6 oz (73.8 kg)  Height: 5\' 9"  (1.753 m)   Gen: Pleasant, ill-appearing elderly man, in no distress,  normal affect  ENT: No lesions,  mouth clear,  oropharynx clear, no postnasal drip  Neck: No JVD, no stridor  Lungs: No use of accessory muscles, distant, clear without rales or rhonchi. No wheeze on forced exp  Cardiovascular: RRR, heart sounds normal, no murmur or gallops, no peripheral edema  Musculoskeletal: No deformities, no cyanosis or clubbing  Neuro: alert, non focal  Skin: Warm, no lesions or rashes       Assessment & Plan:  COPD  (chronic obstructive pulmonary disease) (HCC) Please continue your Breztri 2 puffs twice a day.  Rinse and gargle after using.  This medication is more costly until you make sure insurance deductible.  If it remains too expensive to obtain then please let us know and we can consider alternatives that may be less expensive.  Hopefully once you meet your insurance deductible we will be able to get it reliably.  Will give you samples today to help defray cost Keep albuterol available to use 2 puffs up to every 4 hours if needed for shortness of breath, chest tightness, wheezing. Flu shot is up-to-date Follow-up with APP in 6 months Follow Dr. Delton Coombes in 12 months or sooner if you have any problems.  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    Levy Pupa, MD, PhD 04/01/2023, 5:17 PM [facility] [phone] or if no answer [phone]

## 2023-04-01 NOTE — Patient Instructions (Signed)
 Please continue your Breztri 2 puffs twice a day.  Rinse and gargle after using.  This medication is more costly until you make sure insurance deductible.  If it remains too expensive to obtain then please let us know and we can consider alternatives that may be less expensive.  Hopefully once you meet your insurance deductible we will be able to get it reliably.  Will give you samples today to help defray cost Keep albuterol available to use 2 puffs up to every 4 hours if needed for shortness of breath, chest tightness, wheezing. Flu shot is up-to-date Wear your oxygen at 2-4 L/min depending on your level of exertion. Follow-up with APP in 6 months Follow Dr. Delton Coombes in 12 months or sooner if you have any problems.

## 2023-04-09 ENCOUNTER — Other Ambulatory Visit: Payer: Self-pay | Admitting: Cardiology

## 2023-04-09 ENCOUNTER — Telehealth: Payer: Self-pay | Admitting: Cardiology

## 2023-04-09 DIAGNOSIS — I480 Paroxysmal atrial fibrillation: Secondary | ICD-10-CM

## 2023-04-09 MED ORDER — APIXABAN 2.5 MG PO TABS: 2.5000 mg | ORAL_TABLET | Freq: Two times a day (BID) | ORAL | 1 refills | Status: DC

## 2023-04-09 NOTE — Telephone Encounter (Signed)
*  STAT* If patient is at the pharmacy, call can be transferred to refill team.   1. Which medications need to be refilled? (please list name of each medication and dose if known)   apixaban (ELIQUIS) 2.5 MG TABS tablet     4. Which pharmacy/location (including street and city if local pharmacy) is medication to be sent to? [id number] - [place], [place] - [address]     5. Do they need a 30 day or 90 day supply? 90

## 2023-04-09 NOTE — Telephone Encounter (Signed)
 Prescription refill request for Eliquis received. Indication: Afib  Last office visit: 03/10/23 ([place])  Scr: 2.00 (03/10/23)  Age: 83 Weight: 73.8kg  Appropriate dose. Refill sent.

## 2023-04-16 ENCOUNTER — Encounter: Payer: Self-pay | Admitting: Internal Medicine

## 2023-04-29 NOTE — Progress Notes (Signed)
 noted

## 2023-05-03 ENCOUNTER — Emergency Department (HOSPITAL_COMMUNITY)
Admission: EM | Admit: 2023-05-03 | Discharge: 2023-05-04 | Disposition: A | Discharge status: Home / Self Care | Attending: Emergency Medicine | Admitting: Emergency Medicine

## 2023-05-03 ENCOUNTER — Encounter (HOSPITAL_COMMUNITY): Payer: Self-pay

## 2023-05-03 ENCOUNTER — Other Ambulatory Visit: Payer: Self-pay

## 2023-05-03 ENCOUNTER — Emergency Department (HOSPITAL_COMMUNITY)

## 2023-05-03 DIAGNOSIS — N189 Chronic kidney disease, unspecified: Secondary | ICD-10-CM | POA: Insufficient documentation

## 2023-05-03 DIAGNOSIS — Z7901 Long term (current) use of anticoagulants: Secondary | ICD-10-CM | POA: Insufficient documentation

## 2023-05-03 DIAGNOSIS — R531 Weakness: Secondary | ICD-10-CM | POA: Insufficient documentation

## 2023-05-03 DIAGNOSIS — R600 Localized edema: Secondary | ICD-10-CM | POA: Diagnosis not present

## 2023-05-03 DIAGNOSIS — Z7984 Long term (current) use of oral hypoglycemic drugs: Secondary | ICD-10-CM | POA: Diagnosis not present

## 2023-05-03 DIAGNOSIS — I5030 Unspecified diastolic (congestive) heart failure: Secondary | ICD-10-CM | POA: Diagnosis not present

## 2023-05-03 DIAGNOSIS — I2510 Atherosclerotic heart disease of native coronary artery without angina pectoris: Secondary | ICD-10-CM | POA: Diagnosis not present

## 2023-05-03 DIAGNOSIS — J449 Chronic obstructive pulmonary disease, unspecified: Secondary | ICD-10-CM | POA: Insufficient documentation

## 2023-05-03 DIAGNOSIS — R059 Cough, unspecified: Secondary | ICD-10-CM | POA: Diagnosis not present

## 2023-05-03 DIAGNOSIS — I129 Hypertensive chronic kidney disease with stage 1 through stage 4 chronic kidney disease, or unspecified chronic kidney disease: Secondary | ICD-10-CM | POA: Insufficient documentation

## 2023-05-03 DIAGNOSIS — F0390 Unspecified dementia without behavioral disturbance: Secondary | ICD-10-CM

## 2023-05-03 DIAGNOSIS — Z87891 Personal history of nicotine dependence: Secondary | ICD-10-CM | POA: Diagnosis not present

## 2023-05-03 DIAGNOSIS — M7989 Other specified soft tissue disorders: Secondary | ICD-10-CM | POA: Diagnosis present

## 2023-05-03 DIAGNOSIS — E1122 Type 2 diabetes mellitus with diabetic chronic kidney disease: Secondary | ICD-10-CM | POA: Diagnosis not present

## 2023-05-03 DIAGNOSIS — I5033 Acute on chronic diastolic (congestive) heart failure: Secondary | ICD-10-CM

## 2023-05-03 DIAGNOSIS — I10 Essential (primary) hypertension: Secondary | ICD-10-CM | POA: Diagnosis not present

## 2023-05-03 HISTORY — DX: Unspecified dementia, unspecified severity, without behavioral disturbance, psychotic disturbance, mood disturbance, and anxiety: F03.90

## 2023-05-03 LAB — COMPREHENSIVE METABOLIC PANEL WITH GFR
ALT: 23 U/L (ref 0–44)
AST: 41 U/L (ref 15–41)
Albumin: 3.6 g/dL (ref 3.5–5.0)
Alkaline Phosphatase: 69 U/L (ref 38–126)
Anion gap: 9 (ref 5–15)
BUN: 28 mg/dL — ABNORMAL HIGH (ref 8–23)
CO2: 22 mmol/L (ref 22–32)
Calcium: 9.4 mg/dL (ref 8.9–10.3)
Chloride: 109 mmol/L (ref 98–111)
Creatinine, Ser: 1.81 mg/dL — ABNORMAL HIGH (ref 0.61–1.24)
GFR, Estimated: 37 mL/min — ABNORMAL LOW (ref >60)
Glucose, Bld: 163 mg/dL — ABNORMAL HIGH (ref 70–99)
Potassium: 3.8 mmol/L (ref 3.5–5.1)
Sodium: 140 mmol/L (ref 135–145)
Total Bilirubin: 0.9 mg/dL (ref 0.0–1.2)
Total Protein: 7.5 g/dL (ref 6.5–8.1)

## 2023-05-03 LAB — CBC WITH DIFFERENTIAL/PLATELET
Abs Immature Granulocytes: 0.02 10*3/uL (ref 0.00–0.07)
Basophils Absolute: 0.1 10*3/uL (ref 0.0–0.1)
Basophils Relative: 1 %
Eosinophils Absolute: 0.2 10*3/uL (ref 0.0–0.5)
Eosinophils Relative: 2 %
HCT: 35.8 % — ABNORMAL LOW (ref 39.0–52.0)
Hemoglobin: 11.6 g/dL — ABNORMAL LOW (ref 13.0–17.0)
Immature Granulocytes: 0 %
Lymphocytes Relative: 20 %
Lymphs Abs: 1.7 10*3/uL (ref 0.7–4.0)
MCH: 28.7 pg (ref 26.0–34.0)
MCHC: 32.4 g/dL (ref 30.0–36.0)
MCV: 88.6 fL (ref 80.0–100.0)
Monocytes Absolute: 0.8 10*3/uL (ref 0.1–1.0)
Monocytes Relative: 10 %
Neutro Abs: 5.8 10*3/uL (ref 1.7–7.7)
Neutrophils Relative %: 67 %
Platelets: 206 10*3/uL (ref 150–400)
RBC: 4.04 MIL/uL — ABNORMAL LOW (ref 4.22–5.81)
RDW: 14.7 % (ref 11.5–15.5)
WBC: 8.6 10*3/uL (ref 4.0–10.5)
nRBC: 0 % (ref 0.0–0.2)

## 2023-05-03 LAB — TROPONIN I (HIGH SENSITIVITY): Troponin I (High Sensitivity): 19 ng/L — ABNORMAL HIGH (ref <18)

## 2023-05-03 MED ORDER — FUROSEMIDE 10 MG/ML IJ SOLN: 80.0000 mg | Freq: Once | INTRAMUSCULAR | Status: DC

## 2023-05-03 MED ORDER — FUROSEMIDE 20 MG PO TABS: 60.0000 mg | ORAL_TABLET | Freq: Every day | ORAL | 0 refills | Status: DC

## 2023-05-03 MED ORDER — FUROSEMIDE 40 MG PO TABS
80.0000 mg | ORAL_TABLET | Freq: Once | ORAL | Status: AC
  Administered 2023-05-03: 80 mg via ORAL
  Filled 2023-05-03: qty 2

## 2023-05-03 NOTE — ED Triage Notes (Signed)
 Pt to ED from home with c/o edema to both feet bilaterally. Wife says she just noticed this today. Pt with hx of CHF. Pt has no pain. Pt says he more tired than normal.

## 2023-05-03 NOTE — ED Provider Notes (Signed)
 [place] EMERGENCY DEPARTMENT AT [facility] Provider Note  CSN: [id number] Arrival date & time: 05/03/23 2126  Chief Complaint(s) Leg Swelling  HPI Jason Fisher is a 83 y.o. male history of dementia, atrial fibrillation on Eliquis, CKD, COPD, diabetes, hypertension, hyperlipidemia, coronary artery disease presents to the emergency department with foot swelling.  Patient reports bilateral foot swelling.  Has been worsening.  He also reports mild generalized weakness.  Reports mild shortness of breath.  He has home oxygen which he uses intermittently, has been having to use it more frequently over the last few days.  No nausea or vomiting.  No chest pain.  No abdominal pain.  No other new symptoms.   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 2007 Inf STEMI s/p Vision BMS  2 to RCA;  b. 2009 ISR Prox RCA Stent-->with DES;  c. 09/2015 PCI: LM nl, LAD 30p/m, D1 90, LCX nl, OM1 small, 80, OM2 90 (2.5x14 Biofreedom stent), OM3 small, RCA 30p ISR, 12m ISR, 65m, 85d, EF 50-55%.   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 On Eliquis (CHA2DS2VASc = 5).  Rate controlled w/ BB and CCB.   Chronic diastolic CHF (congestive heart failure) (HCC)    a. 09/2015 Echo: EF 55-60%, mildly dil LA.   CKD (chronic kidney disease), stage III (HCC)    they mentioned this to the patient but they would work on it later after the aneurysm   COLONIC POLYPS, HX OF 11/02/2006   COPD (chronic obstructive pulmonary disease) (HCC)    Dementia (HCC)    DIABETES MELLITUS, TYPE II 10/29/2006   Diverticulosis    History of tobacco abuse    a. Quit 09/2015.   HYPERLIPIDEMIA 10/29/2006   Hypertension    Hypertensive heart disease with heart failure (HCC)    Pneumonia 09/2015   [name] 07/31/2016   STEMI (ST elevation myocardial infarction)  [facility]) 2007   [name] 07/31/2016   Patient Active Problem List   Diagnosis Date Noted   Dementia (HCC)    Folate deficiency 05/02/2022   severe hospital /ICU Delirium 05/02/2022   Chronic hypoxic respiratory failure, on home oxygen therapy (HCC) 05/02/2022   severe B12 deficiency 04/29/2022   Acute respiratory failure with hypoxia and hypercarbia (HCC) 04/27/2022   Permanent atrial fibrillation (HCC) 04/27/2022   Lobar pneumonia (HCC) 04/27/2022   Acute exacerbation of CHF (congestive heart failure) (HCC) 05/11/2020   Hyperglycemia due to diabetes mellitus (HCC) 05/11/2020   Elevated brain natriuretic peptide (BNP) level 05/11/2020   Hypertensive urgency 05/11/2020   Prolonged QT interval 05/11/2020   Atrial fibrillation with RVR (HCC) 04/22/2016   Elevated troponin 04/22/2016   COPD (chronic obstructive pulmonary disease) (HCC) 02/07/2016   Abdominal aortic aneurysm (AAA) without rupture (HCC)    Perirectal abscess 11/04/2015   Pulmonary nodules 11/04/2015   Coronary artery disease of bypass graft of native heart with stable angina pectoris (HCC)    Allergic reaction to contrast dye 09/27/2015   Accelerated hypertension with diastolic congestive heart failure, NYHA class 3 (HCC) 09/27/2015   CHF (congestive heart failure) (HCC) 09/25/2015   CKD (chronic kidney disease), stage III (HCC)    Acute on chronic combined systolic and diastolic congestive heart failure (HCC)    Diastolic congestive heart failure (HCC)    Paroxysmal atrial  fibrillation (HCC)    Angiodysplasia of colon 04/19/2015   TOBACCO USER 04/17/2010   Chronic atrial fibrillation (HCC): CHA2DS2Vasc = 5. On Warfarin. Rate control 11/06/2009   Occlusion and stenosis of carotid artery without mention of cerebral infarction 07/13/2008   Abdominal aortic aneurysm (HCC) 07/13/2008   BENIGN PROSTATIC HYPERTROPHY, HX OF 02/24/2007   MYOCARDIAL INFARCTION, HX OF 11/02/2006   CAD S/P percutaneous coronary angioplasty 11/02/2006    History of colonic polyps 11/02/2006   Dyslipidemia 10/29/2006   Essential hypertension 10/29/2006   Home Medication(s) Prior to Admission medications   Medication Sig Start Date End Date Taking? Authorizing Provider  ACCU-CHEK AVIVA PLUS test strip CHECK BLOOD SUGAR ONCE DAILY. DX E11.51 06/29/19   Philip Aspen, Limmie Patricia, MD  apixaban (ELIQUIS) 2.5 MG TABS tablet Take 1 tablet (2.5 mg total) by mouth 2 (two) times daily. 04/09/23   [place], Peter M, MD  atorvastatin (LIPITOR) 40 MG tablet TAKE 1 TABLET BY MOUTH EVERY DAY 03/29/23   Philip Aspen, Limmie Patricia, MD  Budeson-Glycopyrrol-Formoterol (BREZTRI AEROSPHERE) 160-9-4.8 MCG/ACT AERO Inhale 2 puffs into the lungs in the morning and at bedtime. 11/02/22   Cobb, Ruby Cola, NP  Budeson-Glycopyrrol-Formoterol (BREZTRI AEROSPHERE) 160-9-4.8 MCG/ACT AERO Inhale 2 puffs into the lungs in the morning and at bedtime. 04/01/23   Leslye Peer, MD  Continuous Blood Gluc Receiver (DEXCOM G6 RECEIVER) DEVI CHECK VLOOD SUGAR AS DIRECTED AS DIRECTED 10/23/21   Philip Aspen, Limmie Patricia, MD  Continuous Blood Gluc Sensor (DEXCOM G6 SENSOR) MISC APPLY 1 SENSOR EVERY 10 DAYS 10/14/21   Philip Aspen, Limmie Patricia, MD  Continuous Blood Gluc Transmit (DEXCOM G6 TRANSMITTER) MISC APPLY TO SENSOR EVERY 90 DAYS 10/23/21   Philip Aspen, Limmie Patricia, MD  Cyanocobalamin (B-12 COMPLIANCE INJECTION IJ) Inject as directed every 30 (thirty) days.    [provider]  dextromethorphan (DELSYM) 30 MG/5ML liquid Take 5 mLs (30 mg total) by mouth 2 (two) times daily as needed for cough. 05/02/22   Johnson, Clanford L, MD  diltiazem (CARDIZEM CD) 360 MG 24 hr capsule TAKE 1 CAPSULE BY MOUTH EVERY DAY 03/29/23   Philip Aspen, Limmie Patricia, MD  folic acid (FOLVITE) 1 MG tablet Take 1 tablet (1 mg total) by mouth daily. 05/03/22   Johnson, Clanford L, MD  furosemide (LASIX) 80 MG tablet Take 0.5 tablets (40 mg total) by mouth daily. Patient taking differently: Take 40 mg by  mouth daily. Taking 40 mg. 03/10/23   [place], Peter M, MD  insulin degludec [facility]) 100 UNIT/ML FlexTouch Pen INJECT 32 UNITS INTO THE SKIN AT BEDTIME 03/15/23   Philip Aspen, Limmie Patricia, MD  ipratropium-albuterol (DUONEB) 0.5-2.5 (3) MG/3ML SOLN Take 3 mLs by nebulization every 4 (four) hours as needed (wheezing, shortness of breath, cough). 05/02/22   Johnson, Clanford L, MD  isosorbide mononitrate (IMDUR) 30 MG 24 hr tablet Take 1 tablet (30 mg total) by mouth daily. 10/06/22   [place], Peter M, MD  levalbuterol [facility] HFA) 45 MCG/ACT inhaler INHALE 1 TO 2 PUFFS BY MOUTH EVERY 6 HOURS AS NEEDED FOR WHEEZE [id number] 11/10/22   Cobb, Ruby Cola, NP  metFORMIN (GLUCOPHAGE) 1000 MG tablet TAKE ONE TABLET BY MOUTH AT [facility] AND AT BEDTIME 03/29/23   Philip Aspen, Limmie Patricia, MD  metoprolol succinate (TOPROL XL) 50 MG 24 hr tablet Take 1 tablet (50 mg total) by mouth daily. Take with or immediately following a meal. 03/10/23   [place], Peter M, MD  nitroGLYCERIN (NITROSTAT) 0.4 MG SL  tablet Place 1 tablet (0.4 mg total) under the tongue every 5 (five) minutes as needed for chest pain. X 3 doses 06/09/21   Philip Aspen, Limmie Patricia, MD  polyethylene glycol [facility]) packet Take 17 g by mouth daily as needed for mild constipation.    [provider]  QUEtiapine (SEROQUEL) 25 MG tablet TAKE 0.5 TABLETS BY MOUTH AT BEDTIME. 07/06/22   Philip Aspen, Limmie Patricia, MD  rivastigmine (EXELON) 1.5 MG capsule TAKE 1 CAPSULE (1.5 MG TOTAL) BY MOUTH TWICE A DAY 03/29/23   Windell Norfolk, MD                                                                                                                                    Past Surgical History Past Surgical History:  Procedure Laterality Date   ABDOMINAL AORTIC ENDOVASCULAR STENT GRAFT N/A 07/31/2016   Procedure: ABDOMINAL AORTIC ENDOVASCULAR STENT GRAFT;  Surgeon: Nada Libman, MD;  Location: [facility] OR;  Service: Vascular;  Laterality: N/A;    CARDIAC CATHETERIZATION N/A 09/26/2015   Procedure: Left Heart Cath and Coronary Angiography;  Surgeon: Peter M [place]azil[place], MD;  Location: [facility] INVASIVE CV LAB;  Service: Cardiovascular;  Laterality: N/A;   CARDIAC CATHETERIZATION N/A 09/26/2015   Procedure: Coronary Stent Intervention;  Surgeon: Peter M [place]azil[place], MD;  Location: [facility] INVASIVE CV LAB;  Service: Cardiovascular;  Laterality: N/A;   CATARACT EXTRACTION Bilateral    COLONOSCOPY     CORONARY ANGIOPLASTY WITH STENT PLACEMENT  2007   Inferior STEMI 2007 - RCA PCI with 2 Vision BMS; Abnormal Myoview in 2009 - pRCA ins-stent restenosis & unstented disease - DES PCI Promus 3.0 x 20 mm (3.5 mm)   ENDOVASCULAR REPAIR/STENT GRAFT  07/31/2016   INCISION AND DRAINAGE PERIRECTAL ABSCESS N/A 11/06/2015   Procedure: IRRIGATION AND DEBRIDEMENT PERIRECTAL ABSCESS, POSSIBLE HEMORRHOIDECTOMY;  Surgeon: Abigail Miyamoto, MD;  Location: [facility] OR;  Service: General;  Laterality: N/A;   ORIF SHOULDER FRACTURE Right    "fell out of back end of a truck"   Family History Family History  Problem Relation Age of Onset   Heart attack Mother    Diabetes Mother    Heart attack Father    Hypertension Father    Heart attack Brother    Diabetes Brother     Social History Social History   Tobacco Use   Smoking status: Former    Current packs/day: 0.00    Average packs/day: 1 pack/day for 49.0 years (49.0 ttl pk-yrs)    Types: Cigarettes    Start date: 09/08/1966    Quit date: 09/08/2015    Years since quitting: 7.6    Passive exposure: Current   Smokeless tobacco: Never   Tobacco comments:    passive smoker from 2017-2024  Vaping Use   Vaping status: Never Used  Substance Use Topics   Alcohol use: No    Alcohol/week: 0.0 standard drinks of alcohol  Drug use: No   Allergies Contrast media [iodinated contrast media]  Review of Systems Review of Systems  All other systems reviewed and are negative.   Physical Exam Vital Signs  I have reviewed the triage  vital signs BP (!) 169/98   Pulse 93   Temp 97.7 F (36.5 C) (Oral)   Resp 20   Ht 5\' 9"  (1.753 m)   Wt 73.8 kg   SpO2 92%   BMI 24.03 kg/m  Physical Exam Vitals and nursing note reviewed.  Constitutional:      General: He is not in acute distress.    Appearance: Normal appearance.  HENT:     Mouth/Throat:     Mouth: Mucous membranes are moist.  Eyes:     Conjunctiva/sclera: Conjunctivae normal.  Cardiovascular:     Rate and Rhythm: Normal rate and regular rhythm.  Pulmonary:     Effort: Pulmonary effort is normal. No respiratory distress.     Breath sounds: Normal breath sounds.  Abdominal:     General: Abdomen is flat.     Palpations: Abdomen is soft.     Tenderness: There is no abdominal tenderness.  Musculoskeletal:     Right lower leg: Edema present.     Left lower leg: Edema present.     Comments: Bilateral 2+ foot edema to the ankle  Skin:    General: Skin is warm and dry.     Capillary Refill: Capillary refill takes less than 2 seconds.  Neurological:     Mental Status: He is alert and oriented to person, place, and time. Mental status is at baseline.  Psychiatric:        Mood and Affect: Mood normal.        Behavior: Behavior normal.     ED Results and Treatments Labs (all labs ordered are listed, but only abnormal results are displayed) Labs Reviewed  CBC WITH DIFFERENTIAL/PLATELET - Abnormal; Notable for the following components:      Result Value   RBC 4.04 (*)    Hemoglobin 11.6 (*)    HCT 35.8 (*)    All other components within normal limits  COMPREHENSIVE METABOLIC PANEL WITH GFR  BRAIN NATRIURETIC PEPTIDE  TROPONIN I (HIGH SENSITIVITY)                                                                                                                          Radiology DG Chest Port 1 View Result Date: 05/03/2023 CLINICAL DATA:  Cough, lower extremity edema EXAM: PORTABLE CHEST 1 VIEW COMPARISON:  08/29/2022 FINDINGS: 2 frontal views of the  chest demonstrate an unremarkable cardiac silhouette. No acute airspace disease, effusion, or pneumothorax. No acute bony abnormality. IMPRESSION: 1. No acute intrathoracic process. Electronically Signed   By: Sharlet Salina M.D.   On: 05/03/2023 22:59    Pertinent labs & imaging results that were available during my care of the patient were reviewed by me and considered in my medical decision making (  see MDM for details).  Medications Ordered in ED Medications - No data to display                                                                                                                                   Procedures Procedures  (including critical care time)  Medical Decision Making / ED Course   MDM:  83 year old presenting to the emergency department with leg swelling.  Patient overall well-appearing, physical examination with 2+ bilateral foot edema, no ankle.  No obvious JVD.  Lungs on exam are clear.  Has borderline hypoxia but does have oxygen at home and not currently on oxygen.  Symptoms may represent mild CHF exacerbation.  No tenderness, erythema to suggest DVT and patient on chronic anticoagulation.  No redness to suggest cellulitis.  No wounds on the feet.  Family report he has been compliant with his medications.  Will give dose of Lasix, check basic labs.  Will check chest x-ray.  Will reassess.  If workup is reassuring, patient probably be discharged with outpatient follow-up.      Additional history obtained: -Additional history obtained from {wsadditionalhistorian:[id number]} -External records from outside source obtained and reviewed including: Chart review including previous notes, labs, imaging, consultation notes including ***   Lab Tests: -I ordered, reviewed, and interpreted labs.   The pertinent results include:   Labs Reviewed  CBC WITH DIFFERENTIAL/PLATELET - Abnormal; Notable for the following components:      Result Value   RBC 4.04 (*)    Hemoglobin  11.6 (*)    HCT 35.8 (*)    All other components within normal limits  COMPREHENSIVE METABOLIC PANEL WITH GFR  BRAIN NATRIURETIC PEPTIDE  TROPONIN I (HIGH SENSITIVITY)    Notable for ***  EKG   EKG Interpretation Date/Time:    Ventricular Rate:    PR Interval:    QRS Duration:    QT Interval:    QTC Calculation:   R Axis:      Text Interpretation:           Imaging Studies ordered: I ordered imaging studies including *** On my interpretation imaging demonstrates *** I independently visualized and interpreted imaging. I agree with the radiologist interpretation   Medicines ordered and prescription drug management: No orders of the defined types were placed in this encounter.   -I have reviewed the patients home medicines and have made adjustments as needed   Consultations Obtained: I requested consultation with the ***,  and discussed lab and imaging findings as well as pertinent plan - they recommend: ***   Cardiac Monitoring: The patient was maintained on a cardiac monitor.  I personally viewed and interpreted the cardiac monitored which showed an underlying rhythm of: ***  Social Determinants of Health:  Diagnosis or treatment significantly limited by social determinants of health: {wssoc:28071}   Reevaluation: After the interventions noted above, I reevaluated the patient and found that their  symptoms have {resolved/improved/worsened:23923::"improved"}  Co morbidities that complicate the patient evaluation  Past Medical History:  Diagnosis Date   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 2007 Inf STEMI s/p Vision BMS  2 to RCA;  b. 2009 ISR Prox RCA Stent-->with DES;  c. 09/2015 PCI: LM nl, LAD 30p/m, D1 90, LCX nl, OM1 small, 80, OM2 90 (2.5x14 Biofreedom stent), OM3 small, RCA 30p ISR, 36m ISR, 66m, 85d, EF 50-55%.   Carotid art occ w/o infarc 07/13/2008   Chronic atrial fibrillation  (HCC) 11/06/2009   a. On Eliquis (CHA2DS2VASc = 5).  Rate controlled w/ BB and CCB.   Chronic diastolic CHF (congestive heart failure) (HCC)    a. 09/2015 Echo: EF 55-60%, mildly dil LA.   CKD (chronic kidney disease), stage III (HCC)    they mentioned this to the patient but they would work on it later after the aneurysm   COLONIC POLYPS, HX OF 11/02/2006   COPD (chronic obstructive pulmonary disease) (HCC)    Dementia (HCC)    DIABETES MELLITUS, TYPE II 10/29/2006   Diverticulosis    History of tobacco abuse    a. Quit 09/2015.   HYPERLIPIDEMIA 10/29/2006   Hypertension    Hypertensive heart disease with heart failure (HCC)    Pneumonia 09/2015   [name] 07/31/2016   STEMI (ST elevation myocardial infarction) [facility]) 2007   [name] 07/31/2016      Dispostion: Disposition decision including need for hospitalization was considered, and patient {wsdispo:28070::"discharged from emergency department."}    Final Clinical Impression(s) / ED Diagnoses Final diagnoses:  None     This chart was dictated using voice recognition software.  Despite best efforts to proofread,  errors can occur which can change the documentation meaning.

## 2023-05-03 NOTE — Discharge Instructions (Addendum)
 We evaluated you for your leg swelling.  Your x-ray was clear.  You may be reaccumulating some fluid.  Since your Lasix dose was recently lowered, we will slightly increase it to 60 mg a day.  Please follow-up closely with your cardiologist and your primary care doctor.  We have placed a cardiology referral to help expedite your cardiology follow-up.  If you develop any new or worsening symptoms such as chest pain, lightheadedness or dizziness, leg pain, worsening swelling, difficulty breathing, or any other new symptoms, please return to the emergency department.

## 2023-05-04 LAB — BRAIN NATRIURETIC PEPTIDE: B Natriuretic Peptide: 357 pg/mL — ABNORMAL HIGH (ref 0.0–100.0)

## 2023-05-05 ENCOUNTER — Telehealth: Payer: Self-pay

## 2023-05-05 NOTE — Transitions of Care (Post Inpatient/ED Visit) (Signed)
 05/05/2023  Name: Jason Fisher MRN: [id number] DOB: 01-Jun-1940  Today's TOC FU Call Status: Today's TOC FU Call Status:: Successful TOC FU Call Completed TOC FU Call Complete Date: 05/05/23 Patient's Name and Date of Birth confirmed.  Transition Care Management Follow-up Telephone Call Date of Discharge: 05/04/23 [facility] Type of Discharge: Emergency Department Reason for ED Visit: Other: (edema) How have you been since you were released from the hospital?: Better Any questions or concerns?: No  Items Reviewed: Did you receive and understand the discharge instructions provided?: Yes Medications obtained,verified, and reconciled?: Yes (Medications Reviewed) Any new allergies since your discharge?: No Dietary orders reviewed?: Yes Do you have support at home?: Yes People in Home: spouse  Medications Reviewed Today: Medications Reviewed Today     Reviewed by Karena Addison, LPN (Licensed Practical Nurse) on 05/05/23 at 1051  Med List Status: <None>   Medication Order Taking? Sig Documenting Provider Last Dose Status Informant  ACCU-CHEK AVIVA PLUS test strip [id number] No CHECK BLOOD SUGAR ONCE DAILY. DX E11.51 Philip Aspen, Limmie Patricia, MD Taking Active Self  apixaban (ELIQUIS) 2.5 MG TABS tablet [id number]  Take 1 tablet (2.5 mg total) by mouth 2 (two) times daily. [place], Peter M, MD  Active   atorvastatin (LIPITOR) 40 MG tablet [id number] No TAKE 1 TABLET BY MOUTH EVERY DAY Philip Aspen, Limmie Patricia, MD Taking Active   Budeson-Glycopyrrol-Formoterol (BREZTRI AEROSPHERE) 160-9-4.8 MCG/ACT AERO [id number] No Inhale 2 puffs into the lungs in the morning and at bedtime. Noemi Chapel, NP Taking Active   Budeson-Glycopyrrol-Formoterol (BREZTRI AEROSPHERE) 160-9-4.8 MCG/ACT Sandrea Matte [id number]  Inhale 2 puffs into the lungs in the morning and at bedtime. Leslye Peer, MD  Active   Continuous Blood Gluc Receiver (DEXCOM G6 RECEIVER) DEVI [id number] No  CHECK VLOOD SUGAR AS DIRECTED AS DIRECTED Philip Aspen, Limmie Patricia, MD Taking Active Self  Continuous Blood Gluc Sensor (DEXCOM G6 SENSOR) MISC [id number] No APPLY 1 SENSOR EVERY 10 DAYS Philip Aspen, Limmie Patricia, MD Taking Active Self  Continuous Blood Gluc Transmit (DEXCOM G6 TRANSMITTER) MISC [id number] No APPLY TO SENSOR EVERY 90 DAYS Philip Aspen, Limmie Patricia, MD Taking Active Self  Cyanocobalamin (B-12 COMPLIANCE INJECTION IJ) [id number] No Inject as directed every 30 (thirty) days. [provider] Taking Active   dextromethorphan (DELSYM) 30 MG/5ML liquid [id number] No Take 5 mLs (30 mg total) by mouth 2 (two) times daily as needed for cough. Cleora Fleet, MD Taking Active   diltiazem (CARDIZEM CD) 360 MG 24 hr capsule [id number] No TAKE 1 CAPSULE BY MOUTH EVERY DAY Philip Aspen, Limmie Patricia, MD Taking Active   folic acid (FOLVITE) 1 MG tablet [id number] No Take 1 tablet (1 mg total) by mouth daily. Cleora Fleet, MD Taking Active   furosemide (LASIX) 20 MG tablet [id number]  Take 3 tablets (60 mg total) by mouth daily. Lonell Grandchild, MD  Active   insulin degludec [facility]) 100 UNIT/ML FlexTouch Pen [id number] No INJECT 32 UNITS INTO THE SKIN AT BEDTIME Philip Aspen, Limmie Patricia, MD Taking Active   ipratropium-albuterol (DUONEB) 0.5-2.5 (3) MG/3ML SOLN [id number] No Take 3 mLs by nebulization every 4 (four) hours as needed (wheezing, shortness of breath, cough). Johnson, Clanford L, MD Taking Active   isosorbide mononitrate (IMDUR) 30 MG 24 hr tablet [id number] No Take 1 tablet (30 mg total) by mouth daily. [place], Peter M, MD Taking Active   levalbuterol [facility] HFA) 45 MCG/ACT inhaler [id number] No INHALE 1 TO 2 PUFFS  BY MOUTH EVERY 6 HOURS AS NEEDED FOR WHEEZE [id number] Noemi Chapel, NP Taking Active   metFORMIN (GLUCOPHAGE) 1000 MG tablet [id number] No TAKE ONE TABLET BY MOUTH AT BREAKFAST AND AT BEDTIME Philip Aspen, Limmie Patricia, MD Taking Active    metoprolol succinate (TOPROL XL) 50 MG 24 hr tablet [id number] No Take 1 tablet (50 mg total) by mouth daily. Take with or immediately following a meal. [place], Peter M, MD Taking Active   nitroGLYCERIN (NITROSTAT) 0.4 MG SL tablet [id number] No Place 1 tablet (0.4 mg total) under the tongue every 5 (five) minutes as needed for chest pain. X 3 doses Philip Aspen, Limmie Patricia, MD Taking Active Self  polyethylene glycol [facility] / GLYCOLAX) packet [id number] No Take 17 g by mouth daily as needed for mild constipation. [provider] Taking Active Self  QUEtiapine (SEROQUEL) 25 MG tablet [id number] No TAKE 0.5 TABLETS BY MOUTH AT BEDTIME. Philip Aspen, Limmie Patricia, MD Taking Active   rivastigmine (EXELON) 1.5 MG capsule [id number] No TAKE 1 CAPSULE (1.5 MG TOTAL) BY MOUTH TWICE A DAY Windell Norfolk, MD Taking Active             Home Care and Equipment/Supplies: Were Home Health Services Ordered?: NA Any new equipment or medical supplies ordered?: NA  Functional Questionnaire: Do you need assistance with bathing/showering or dressing?: No Do you need assistance with meal preparation?: No Do you need assistance with eating?: No Do you have difficulty maintaining continence: No Do you need assistance with getting out of bed/getting out of a chair/moving?: No Do you have difficulty managing or taking your medications?: No  Follow up appointments reviewed: PCP Follow-up appointment confirmed?: Yes Date of PCP follow-up appointment?: 05/10/23 Follow-up Provider: Philip Aspen Specialist [facility] Follow-up appointment confirmed?: NA Do you need transportation to your follow-up appointment?: No Do you understand care options if your condition(s) worsen?: Yes-patient verbalized understanding    SIGNATURE Karena Addison, LPN [facility] Nurse Health Advisor Direct Dial [phone]

## 2023-05-08 ENCOUNTER — Encounter (HOSPITAL_COMMUNITY): Payer: Self-pay | Admitting: Emergency Medicine

## 2023-05-08 ENCOUNTER — Emergency Department (HOSPITAL_COMMUNITY)

## 2023-05-08 ENCOUNTER — Other Ambulatory Visit: Payer: Self-pay

## 2023-05-08 ENCOUNTER — Emergency Department (HOSPITAL_COMMUNITY)
Admission: EM | Admit: 2023-05-08 | Discharge: 2023-05-09 | Disposition: A | Discharge status: Home / Self Care | Attending: Emergency Medicine | Admitting: Emergency Medicine

## 2023-05-08 DIAGNOSIS — I4891 Unspecified atrial fibrillation: Secondary | ICD-10-CM | POA: Diagnosis not present

## 2023-05-08 DIAGNOSIS — J441 Chronic obstructive pulmonary disease with (acute) exacerbation: Secondary | ICD-10-CM | POA: Diagnosis not present

## 2023-05-08 DIAGNOSIS — R0602 Shortness of breath: Secondary | ICD-10-CM | POA: Diagnosis not present

## 2023-05-08 DIAGNOSIS — R072 Precordial pain: Secondary | ICD-10-CM

## 2023-05-08 DIAGNOSIS — I4821 Permanent atrial fibrillation: Secondary | ICD-10-CM | POA: Diagnosis not present

## 2023-05-08 DIAGNOSIS — R062 Wheezing: Secondary | ICD-10-CM

## 2023-05-08 DIAGNOSIS — F17200 Nicotine dependence, unspecified, uncomplicated: Secondary | ICD-10-CM | POA: Insufficient documentation

## 2023-05-08 DIAGNOSIS — R0789 Other chest pain: Secondary | ICD-10-CM | POA: Diagnosis not present

## 2023-05-08 DIAGNOSIS — Z7901 Long term (current) use of anticoagulants: Secondary | ICD-10-CM | POA: Diagnosis not present

## 2023-05-08 DIAGNOSIS — J90 Pleural effusion, not elsewhere classified: Secondary | ICD-10-CM | POA: Diagnosis not present

## 2023-05-08 DIAGNOSIS — E876 Hypokalemia: Secondary | ICD-10-CM | POA: Insufficient documentation

## 2023-05-08 DIAGNOSIS — I517 Cardiomegaly: Secondary | ICD-10-CM | POA: Diagnosis not present

## 2023-05-08 DIAGNOSIS — R002 Palpitations: Secondary | ICD-10-CM

## 2023-05-08 DIAGNOSIS — N1832 Chronic kidney disease, stage 3b: Secondary | ICD-10-CM

## 2023-05-08 DIAGNOSIS — N189 Chronic kidney disease, unspecified: Secondary | ICD-10-CM | POA: Diagnosis not present

## 2023-05-08 LAB — CBC
HCT: 33.6 % — ABNORMAL LOW (ref 39.0–52.0)
Hemoglobin: 10.9 g/dL — ABNORMAL LOW (ref 13.0–17.0)
MCH: 28.5 pg (ref 26.0–34.0)
MCHC: 32.4 g/dL (ref 30.0–36.0)
MCV: 88 fL (ref 80.0–100.0)
Platelets: 217 10*3/uL (ref 150–400)
RBC: 3.82 MIL/uL — ABNORMAL LOW (ref 4.22–5.81)
RDW: 14.7 % (ref 11.5–15.5)
WBC: 8.7 10*3/uL (ref 4.0–10.5)
nRBC: 0 % (ref 0.0–0.2)

## 2023-05-08 LAB — I-STAT CHEM 8, ED
BUN: 26 mg/dL — ABNORMAL HIGH (ref 8–23)
Calcium, Ion: 1.18 mmol/L (ref 1.15–1.40)
Chloride: 107 mmol/L (ref 98–111)
Creatinine, Ser: 2.5 mg/dL — ABNORMAL HIGH (ref 0.61–1.24)
Glucose, Bld: 164 mg/dL — ABNORMAL HIGH (ref 70–99)
HCT: 32 % — ABNORMAL LOW (ref 39.0–52.0)
Hemoglobin: 10.9 g/dL — ABNORMAL LOW (ref 13.0–17.0)
Potassium: 3.3 mmol/L — ABNORMAL LOW (ref 3.5–5.1)
Sodium: 144 mmol/L (ref 135–145)
TCO2: 24 mmol/L (ref 22–32)

## 2023-05-08 LAB — BASIC METABOLIC PANEL WITH GFR
Anion gap: 14 (ref 5–15)
BUN: 28 mg/dL — ABNORMAL HIGH (ref 8–23)
CO2: 23 mmol/L (ref 22–32)
Calcium: 9.4 mg/dL (ref 8.9–10.3)
Chloride: 104 mmol/L (ref 98–111)
Creatinine, Ser: 2.21 mg/dL — ABNORMAL HIGH (ref 0.61–1.24)
GFR, Estimated: 29 mL/min — ABNORMAL LOW (ref >60)
Glucose, Bld: 163 mg/dL — ABNORMAL HIGH (ref 70–99)
Potassium: 3.2 mmol/L — ABNORMAL LOW (ref 3.5–5.1)
Sodium: 141 mmol/L (ref 135–145)

## 2023-05-08 LAB — BRAIN NATRIURETIC PEPTIDE: B Natriuretic Peptide: 238 pg/mL — ABNORMAL HIGH (ref 0.0–100.0)

## 2023-05-08 LAB — RESP PANEL BY RT-PCR (RSV, FLU A&B, COVID)  RVPGX2
Influenza A by PCR: NEGATIVE
Influenza B by PCR: NEGATIVE
Resp Syncytial Virus by PCR: NEGATIVE
SARS Coronavirus 2 by RT PCR: NEGATIVE

## 2023-05-08 LAB — TROPONIN I (HIGH SENSITIVITY)
Troponin I (High Sensitivity): 25 ng/L — ABNORMAL HIGH (ref <18)
Troponin I (High Sensitivity): 24 ng/L — ABNORMAL HIGH (ref <18)

## 2023-05-08 MED ORDER — IPRATROPIUM BROMIDE 0.02 % IN SOLN
0.5000 mg | Freq: Once | RESPIRATORY_TRACT | Status: AC
  Administered 2023-05-08: 0.5 mg via RESPIRATORY_TRACT
  Filled 2023-05-08: qty 2.5

## 2023-05-08 MED ORDER — METHYLPREDNISOLONE SODIUM SUCC 125 MG IJ SOLR
125.0000 mg | Freq: Once | INTRAMUSCULAR | Status: DC
  Filled 2023-05-08: qty 2

## 2023-05-08 MED ORDER — ALBUTEROL SULFATE (2.5 MG/3ML) 0.083% IN NEBU
5.0000 mg | INHALATION_SOLUTION | Freq: Once | RESPIRATORY_TRACT | Status: AC
  Administered 2023-05-08: 5 mg via RESPIRATORY_TRACT
  Filled 2023-05-08: qty 6

## 2023-05-08 MED ORDER — POTASSIUM CHLORIDE 20 MEQ PO PACK
40.0000 meq | PACK | Freq: Two times a day (BID) | ORAL | Status: DC
  Administered 2023-05-08: 40 meq via ORAL
  Filled 2023-05-08: qty 2

## 2023-05-08 NOTE — ED Triage Notes (Signed)
 Pt from home with onset of "heart acting up" and left shoulder pain/numbness.  No n/v/d.  No fever.  Has had some cough.  Wife reports feet are still swelling despite increasing lasix last week.

## 2023-05-08 NOTE — Discharge Instructions (Addendum)
 It was our pleasure to provide your ER care today - we hope that you feel better.  Use albuterol treatment as need. Avoid any smoking.   For recent chest pain, follow up closely with cardiologist in the next 1-2 weeks - we made referral, and they should be contacting you with an appointment in the next few days.   For chronic kidney disease, and low potassium, follow up with primary care doctor in the coming week.  For potassium, eat plenty of fruits and vegetables, and follow up with your doctor.   Return to ER right away if worse, new symptoms, fevers, recurrent/persistent chest pain, increased trouble breathing, or other concern.

## 2023-05-08 NOTE — ED Provider Notes (Signed)
 [place] EMERGENCY DEPARTMENT AT [facility] Provider Note   CSN: [id number] Arrival date & time: 05/08/23  2014     History  Chief Complaint  Patient presents with   Chest Pain    Jason Fisher is a 83 y.o. male.  Patient w hx afib, c/o ?chest discomfort in past 1-2 days, and feeling increased sob at times. Also indicates at times is as if can feel heart palpitating. No persistent fast heart beating. No syncope. +non prod cough. No sore throat. No fever or chills. +smoker. Uses nebilizer prn, and indicates that often helps his breathing. Denies exertional chest pain or discomfort. No pleuritic chest pain. No new leg pain/swelling. Compliant w meds incl eliquis. +smoker, and indicates still smokes some.   The history is provided by the patient and medical records.  Chest Pain Associated symptoms: cough, palpitations and shortness of breath   Associated symptoms: no abdominal pain, no back pain, no fever, no headache and no vomiting        Home Medications Prior to Admission medications   Medication Sig Start Date End Date Taking? Authorizing Provider  ACCU-CHEK AVIVA PLUS test strip CHECK BLOOD SUGAR ONCE DAILY. DX E11.51 06/29/19   Philip Aspen, Limmie Patricia, MD  apixaban (ELIQUIS) 2.5 MG TABS tablet Take 1 tablet (2.5 mg total) by mouth 2 (two) times daily. 04/09/23   [place], Peter M, MD  atorvastatin (LIPITOR) 40 MG tablet TAKE 1 TABLET BY MOUTH EVERY DAY 03/29/23   Philip Aspen, Limmie Patricia, MD  Budeson-Glycopyrrol-Formoterol (BREZTRI AEROSPHERE) 160-9-4.8 MCG/ACT AERO Inhale 2 puffs into the lungs in the morning and at bedtime. 11/02/22   Cobb, Ruby Cola, NP  Budeson-Glycopyrrol-Formoterol (BREZTRI AEROSPHERE) 160-9-4.8 MCG/ACT AERO Inhale 2 puffs into the lungs in the morning and at bedtime. 04/01/23   Leslye Peer, MD  Continuous Blood Gluc Receiver (DEXCOM G6 RECEIVER) DEVI CHECK VLOOD SUGAR AS DIRECTED AS DIRECTED 10/23/21   Philip Aspen, Limmie Patricia, MD   Continuous Blood Gluc Sensor (DEXCOM G6 SENSOR) MISC APPLY 1 SENSOR EVERY 10 DAYS 10/14/21   Philip Aspen, Limmie Patricia, MD  Continuous Blood Gluc Transmit (DEXCOM G6 TRANSMITTER) MISC APPLY TO SENSOR EVERY 90 DAYS 10/23/21   Philip Aspen, Limmie Patricia, MD  Cyanocobalamin (B-12 COMPLIANCE INJECTION IJ) Inject as directed every 30 (thirty) days.    [provider]  dextromethorphan (DELSYM) 30 MG/5ML liquid Take 5 mLs (30 mg total) by mouth 2 (two) times daily as needed for cough. 05/02/22   Johnson, Clanford L, MD  diltiazem (CARDIZEM CD) 360 MG 24 hr capsule TAKE 1 CAPSULE BY MOUTH EVERY DAY 03/29/23   Philip Aspen, Limmie Patricia, MD  folic acid (FOLVITE) 1 MG tablet Take 1 tablet (1 mg total) by mouth daily. 05/03/22   Johnson, Clanford L, MD  furosemide (LASIX) 20 MG tablet Take 3 tablets (60 mg total) by mouth daily. 05/03/23   Lonell Grandchild, MD  insulin degludec (TRESIBA FLEXTOUCH) 100 UNIT/ML FlexTouch Pen INJECT 32 UNITS INTO THE SKIN AT BEDTIME 03/15/23   Philip Aspen, Limmie Patricia, MD  ipratropium-albuterol (DUONEB) 0.5-2.5 (3) MG/3ML SOLN Take 3 mLs by nebulization every 4 (four) hours as needed (wheezing, shortness of breath, cough). 05/02/22   Johnson, Clanford L, MD  isosorbide mononitrate (IMDUR) 30 MG 24 hr tablet Take 1 tablet (30 mg total) by mouth daily. 10/06/22   [place], Peter M, MD  levalbuterol [facility] HFA) 45 MCG/ACT inhaler INHALE 1 TO 2 PUFFS BY MOUTH EVERY 6 HOURS AS  NEEDED FOR WHEEZE [id number] 11/10/22   Cobb, Ruby Cola, NP  metFORMIN (GLUCOPHAGE) 1000 MG tablet TAKE ONE TABLET BY MOUTH AT [facility] AND AT BEDTIME 03/29/23   Philip Aspen, Limmie Patricia, MD  metoprolol succinate (TOPROL XL) 50 MG 24 hr tablet Take 1 tablet (50 mg total) by mouth daily. Take with or immediately following a meal. 03/10/23   [place], Peter M, MD  nitroGLYCERIN (NITROSTAT) 0.4 MG SL tablet Place 1 tablet (0.4 mg total) under the tongue every 5 (five) minutes as needed for chest pain. X 3 doses  06/09/21   Philip Aspen, Limmie Patricia, MD  polyethylene glycol [facility] Ethelene Hal) packet Take 17 g by mouth daily as needed for mild constipation.    [provider]  QUEtiapine (SEROQUEL) 25 MG tablet TAKE 0.5 TABLETS BY MOUTH AT BEDTIME. 07/06/22   Philip Aspen, Limmie Patricia, MD  rivastigmine (EXELON) 1.5 MG capsule TAKE 1 CAPSULE (1.5 MG TOTAL) BY MOUTH TWICE A DAY 03/29/23   Windell Norfolk, MD      Allergies    Contrast media [iodinated contrast media]    Review of Systems   Review of Systems  Constitutional:  Negative for fever.  HENT:  Negative for sore throat.   Eyes:  Negative for redness.  Respiratory:  Positive for cough and shortness of breath.   Cardiovascular:  Positive for chest pain and palpitations.  Gastrointestinal:  Negative for abdominal pain, diarrhea and vomiting.  Genitourinary:  Negative for dysuria and flank pain.  Musculoskeletal:  Negative for back pain and neck pain.  Skin:  Negative for rash.  Neurological:  Negative for headaches.    Physical Exam Updated Vital Signs BP (!) 177/83   Pulse 92   Temp 98.1 F (36.7 C) (Oral)   Resp 15   SpO2 95%  Physical Exam Vitals and nursing note reviewed.  Constitutional:      Appearance: Normal appearance. He is well-developed.  HENT:     Head: Atraumatic.     Nose: Nose normal.     Mouth/Throat:     Mouth: Mucous membranes are moist.     Pharynx: Oropharynx is clear.  Eyes:     General: No scleral icterus.    Conjunctiva/sclera: Conjunctivae normal.     Pupils: Pupils are equal, round, and reactive to light.  Neck:     Trachea: No tracheal deviation.  Cardiovascular:     Rate and Rhythm: Normal rate and regular rhythm.     Pulses: Normal pulses.     Heart sounds: Normal heart sounds. No murmur heard.    No friction rub. No gallop.  Pulmonary:     Effort: Pulmonary effort is normal. No accessory muscle usage or respiratory distress.     Breath sounds: Wheezing present.  Abdominal:      General: Bowel sounds are normal. There is no distension.     Palpations: Abdomen is soft.     Tenderness: There is no abdominal tenderness.  Genitourinary:    Comments: No cva tenderness. Musculoskeletal:        General: No tenderness.     Cervical back: Normal range of motion and neck supple. No rigidity.     Comments: Mild symmetric bil ankle/lower leg edema.   Skin:    General: Skin is warm and dry.     Findings: No rash.  Neurological:     Mental Status: He is alert.     Comments: Alert, speech clear.   Psychiatric:  Mood and Affect: Mood normal.     ED Results / Procedures / Treatments   Labs (all labs ordered are listed, but only abnormal results are displayed) Results for orders placed or performed during the hospital encounter of 05/08/23  Troponin I (High Sensitivity)   Collection Time: 05/08/23  8:27 PM  Result Value Ref Range   Troponin I (High Sensitivity) 25 (H) <18 ng/L  Basic metabolic panel   Collection Time: 05/08/23  8:28 PM  Result Value Ref Range   Sodium 141 135 - 145 mmol/L   Potassium 3.2 (L) 3.5 - 5.1 mmol/L   Chloride 104 98 - 111 mmol/L   CO2 23 22 - 32 mmol/L   Glucose, Bld 163 (H) 70 - 99 mg/dL   BUN 28 (H) 8 - 23 mg/dL   Creatinine, Ser 1.61 (H) 0.61 - 1.24 mg/dL   Calcium 9.4 8.9 - 09.6 mg/dL   GFR, Estimated 29 (L) >60 mL/min   Anion gap 14 5 - 15  CBC   Collection Time: 05/08/23  8:28 PM  Result Value Ref Range   WBC 8.7 4.0 - 10.5 K/uL   RBC 3.82 (L) 4.22 - 5.81 MIL/uL   Hemoglobin 10.9 (L) 13.0 - 17.0 g/dL   HCT 04.5 (L) 40.9 - 81.1 %   MCV 88.0 80.0 - 100.0 fL   MCH 28.5 26.0 - 34.0 pg   MCHC 32.4 30.0 - 36.0 g/dL   RDW 91.4 78.2 - 95.6 %   Platelets 217 150 - 400 K/uL   nRBC 0.0 0.0 - 0.2 %  Brain natriuretic peptide   Collection Time: 05/08/23  8:28 PM  Result Value Ref Range   B Natriuretic Peptide 238.0 (H) 0.0 - 100.0 pg/mL  Resp panel by RT-PCR (RSV, Flu A&B, Covid) Anterior Nasal Swab   Collection Time:  05/08/23  8:30 PM   Specimen: Anterior Nasal Swab  Result Value Ref Range   SARS Coronavirus 2 by RT PCR NEGATIVE NEGATIVE   Influenza A by PCR NEGATIVE NEGATIVE   Influenza B by PCR NEGATIVE NEGATIVE   Resp Syncytial Virus by PCR NEGATIVE NEGATIVE  I-stat chem 8, ED   Collection Time: 05/08/23  8:34 PM  Result Value Ref Range   Sodium 144 135 - 145 mmol/L   Potassium 3.3 (L) 3.5 - 5.1 mmol/L   Chloride 107 98 - 111 mmol/L   BUN 26 (H) 8 - 23 mg/dL   Creatinine, Ser 2.13 (H) 0.61 - 1.24 mg/dL   Glucose, Bld 086 (H) 70 - 99 mg/dL   Calcium, Ion 5.78 4.69 - 1.40 mmol/L   TCO2 24 22 - 32 mmol/L   Hemoglobin 10.9 (L) 13.0 - 17.0 g/dL   HCT 62.9 (L) 52.8 - 41.3 %  Troponin I (High Sensitivity)   Collection Time: 05/08/23 10:27 PM  Result Value Ref Range   Troponin I (High Sensitivity) 24 (H) <18 ng/L   DG Chest Portable 1 View Result Date: 05/08/2023 CLINICAL DATA:  Shortness of breath. EXAM: PORTABLE CHEST 1 VIEW COMPARISON:  Chest radiograph dated 05/03/2023. FINDINGS: Trace bilateral pleural effusions. No focal consolidation or pneumothorax. Mild cardiomegaly. No acute osseous pathology. IMPRESSION: Trace bilateral pleural effusions. No focal consolidation. Electronically Signed   By: Elgie Collard M.D.   On: 05/08/2023 20:59   DG Chest Port 1 View Result Date: 05/03/2023 CLINICAL DATA:  Cough, lower extremity edema EXAM: PORTABLE CHEST 1 VIEW COMPARISON:  08/29/2022 FINDINGS: 2 frontal views of the chest demonstrate an unremarkable cardiac  silhouette. No acute airspace disease, effusion, or pneumothorax. No acute bony abnormality. IMPRESSION: 1. No acute intrathoracic process. Electronically Signed   By: Sharlet Salina M.D.   On: 05/03/2023 22:59     EKG EKG Interpretation Date/Time:  Saturday May 08 2023 14:78:29 EDT Ventricular Rate:  93 PR Interval:    QRS Duration:  114 QT Interval:  471 QTC Calculation: 586 R Axis:   41  Text Interpretation: Atrial fibrillation  Premature ventricular complexes Non-specific ST-t changes Confirmed by Cathren Laine ([id number]) on 05/08/2023 8:34:55 PM  Radiology DG Chest Portable 1 View Result Date: 05/08/2023 CLINICAL DATA:  Shortness of breath. EXAM: PORTABLE CHEST 1 VIEW COMPARISON:  Chest radiograph dated 05/03/2023. FINDINGS: Trace bilateral pleural effusions. No focal consolidation or pneumothorax. Mild cardiomegaly. No acute osseous pathology. IMPRESSION: Trace bilateral pleural effusions. No focal consolidation. Electronically Signed   By: Elgie Collard M.D.   On: 05/08/2023 20:59    Procedures Procedures    Medications Ordered in ED Medications  potassium chloride (KLOR-CON) packet 40 mEq (has no administration in time range)  albuterol (PROVENTIL) (2.5 MG/3ML) 0.083% nebulizer solution 5 mg (5 mg Nebulization Given 05/08/23 2151)  ipratropium (ATROVENT) nebulizer solution 0.5 mg (0.5 mg Nebulization Given 05/08/23 2151)  albuterol (PROVENTIL) (2.5 MG/3ML) 0.083% nebulizer solution 5 mg (5 mg Nebulization Given 05/08/23 2301)  ipratropium (ATROVENT) nebulizer solution 0.5 mg (0.5 mg Nebulization Given 05/08/23 2302)    ED Course/ Medical Decision Making/ A&P                                 Medical Decision Making Problems Addressed: COPD exacerbation [facility]): acute illness or injury Hypokalemia: acute illness or injury Palpitation: acute illness or injury Permanent atrial fibrillation [facility]): chronic illness or injury with exacerbation, progression, or side effects of treatment that poses a threat to life or bodily functions Pleural effusion: acute illness or injury Precordial chest pain: acute illness or injury Shortness of breath: acute illness or injury with systemic symptoms that poses a threat to life or bodily functions Wheezing: acute illness or injury  Amount and/or Complexity of Data Reviewed Independent Historian:     Details: Family, hx External Data Reviewed: labs and notes. Labs: ordered.  Decision-making details documented in ED Course. Radiology: ordered and independent interpretation performed. Decision-making details documented in ED Course. ECG/medicine tests: ordered and independent interpretation performed. Decision-making details documented in ED Course.  Risk Prescription drug management. Decision regarding hospitalization.   Iv ns. Continuous pulse ox and cardiac monitoring. Labs ordered/sent. Imaging ordered.   Differential diagnosis includes  . Dispo decision including potential need for admission considered - will get labs and imaging and reassess.   Reviewed nursing notes and prior charts for additional history. External reports reviewed. Additional history from: spouse.   Albuterol and atrovent neb. Solumedrol ordered - pt/spouse indicate cannot take steroids, causes psychosis/altered ms.   Cardiac monitor: afib, rate 90. Pt with hx chronic/permanent afib. Rate is controlled.   Labs reviewed/interpreted by me - wbc 8, hgb 11. ?mild aki on ckd. Ivf. K mildly low. Kcl po. Bnp sl elev, similar to prior. Trop sl elev, similar to prior.   Xrays reviewed/interpreted by me - ?minimal eff, no pna.   Recheck wheezing improved. Sl wheezing. Pt feels improved.   Albuterol and atrovent neb.   Wheezing resolved. Pt feels breathing fine. No chest pain or palpitations. Rr 16, pulse ox 95% room air. Delta trop  pending.   Additional labs reviewed/interpreted by me - delta trop normal, not increased. No chest pain. Chest cta. Feels breathing at baseline.   Pt currently appears stable for d/c.   Rec close pcp/card f/u.  Return precautions provided.          Final Clinical Impression(s) / ED Diagnoses Final diagnoses:  Wheezing  Shortness of breath  COPD exacerbation (HCC)  Precordial chest pain  Pleural effusion  Permanent atrial fibrillation (HCC)  Palpitation    Rx / DC Orders ED Discharge Orders          Ordered    Ambulatory referral to  Cardiology       Comments: If you have not heard from the Cardiology office within the next 72 hours please call [phone].   05/08/23 2222              Cathren Laine, MD 05/08/23 2353

## 2023-05-10 ENCOUNTER — Encounter: Payer: Self-pay | Admitting: Internal Medicine

## 2023-05-10 ENCOUNTER — Ambulatory Visit (INDEPENDENT_AMBULATORY_CARE_PROVIDER_SITE_OTHER): Admitting: Internal Medicine

## 2023-05-10 VITALS — BP 110/62 | HR 82 | Temp 97.7°F | Wt 157.5 lb

## 2023-05-10 DIAGNOSIS — I5032 Chronic diastolic (congestive) heart failure: Secondary | ICD-10-CM | POA: Diagnosis not present

## 2023-05-10 DIAGNOSIS — J42 Unspecified chronic bronchitis: Secondary | ICD-10-CM

## 2023-05-10 DIAGNOSIS — E876 Hypokalemia: Secondary | ICD-10-CM | POA: Diagnosis not present

## 2023-05-10 DIAGNOSIS — I4821 Permanent atrial fibrillation: Secondary | ICD-10-CM | POA: Diagnosis not present

## 2023-05-10 DIAGNOSIS — Z09 Encounter for follow-up examination after completed treatment for conditions other than malignant neoplasm: Secondary | ICD-10-CM

## 2023-05-10 NOTE — Progress Notes (Signed)
 Established Patient Office Visit     CC/Reason for Visit: ED follow-up  HPI: Jason Fisher is a 83 y.o. male who is coming in today for the above mentioned reasons. Past Medical History is significant for: Hypertension, hyperlipidemia, type 2 diabetes, coronary artery disease, heart failure. Regarding paroxysmal Paxlovid, OSA, COPD, A-fib, stage III chronic kidney disease.  He has had 2 recent visits to the emergency department.  The first 1 on March 31 was for lower extremity edema.  His Lasix dose was increased from 40 to 60 mg.  He will be needing follow-up with his cardiologist which is not yet scheduled.  He was also found to be hypokalemic and was given potassium supplementation.  He had a second visit on April 5 for shortness of breath.  He was found to have a COPD exacerbation and improved after nebs.  He feels the shortness of breath has improved.  As has his lower extremity edema.   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 2007 Inf STEMI s/p Vision BMS  2 to RCA;  b. 2009 ISR Prox RCA Stent-->with DES;  c. 09/2015 PCI: LM nl, LAD 30p/m, D1 90, LCX nl, OM1 small, 80, OM2 90 (2.5x14 Biofreedom stent), OM3 small, RCA 30p ISR, 12m ISR, 3m, 85d, EF 50-55%.   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 On Eliquis (CHA2DS2VASc = 5).  Rate controlled w/ BB and CCB.   Chronic diastolic CHF (congestive heart failure) (HCC)    a. 09/2015 Echo: EF 55-60%, mildly dil LA.   CKD (chronic kidney disease), stage III (HCC)    they mentioned this to the patient but they would work on it later after the aneurysm   COLONIC POLYPS, HX OF 11/02/2006   COPD (chronic obstructive pulmonary disease) (HCC)    Dementia (HCC)    DIABETES MELLITUS, TYPE II 10/29/2006   Diverticulosis    History of tobacco abuse    a.  Quit 09/2015.   HYPERLIPIDEMIA 10/29/2006   Hypertension    Hypertensive heart disease with heart failure (HCC)    Pneumonia 09/2015   [name] 07/31/2016   STEMI (ST elevation myocardial infarction) [facility]) 2007   [name] 07/31/2016    Past Surgical History:  Procedure Laterality Date   ABDOMINAL AORTIC ENDOVASCULAR STENT GRAFT N/A 07/31/2016   Procedure: ABDOMINAL AORTIC ENDOVASCULAR STENT GRAFT;  Surgeon: Nada Libman, MD;  Location: [facility] OR;  Service: Vascular;  Laterality: N/A;   CARDIAC CATHETERIZATION N/A 09/26/2015   Procedure: Left Heart Cath and Coronary Angiography;  Surgeon: Peter M Swaziland, MD;  Location: [facility] INVASIVE CV LAB;  Service: Cardiovascular;  Laterality: N/A;   CARDIAC CATHETERIZATION N/A 09/26/2015   Procedure: Coronary Stent Intervention;  Surgeon: Peter M Swazil[place], MD;  Location: [facility] INVASIVE CV LAB;  Service: Cardiovascular;  Laterality: N/A;   CATARACT EXTRACTION Bilateral    COLONOSCOPY     CORONARY ANGIOPLASTY WITH STENT PLACEMENT  2007   Inferior STEMI 2007 - RCA PCI with 2 Vision BMS; Abnormal Myoview in 2009 - pRCA ins-stent restenosis & unstented disease - DES PCI Promus 3.0 x 20 mm (3.5 mm)   ENDOVASCULAR REPAIR/STENT GRAFT  07/31/2016   INCISION AND DRAINAGE PERIRECTAL ABSCESS N/A 11/06/2015   Procedure: IRRIGATION AND DEBRIDEMENT PERIRECTAL ABSCESS, POSSIBLE HEMORRHOIDECTOMY;  Surgeon: Abigail Miyamoto,  MD;  Location: [facility] OR;  Service: General;  Laterality: N/A;   ORIF SHOULDER FRACTURE Right    "fell out of back end of a truck"    Social History:  reports that he quit smoking about 7 years ago. His smoking use included cigarettes. He started smoking about 56 years ago. He has a 49 pack-year smoking history. He has been exposed to tobacco smoke. He has never used smokeless tobacco. He reports that he does not drink alcohol and does not use drugs.  Allergies: Allergies  Allergen Reactions   Contrast Media [Iodinated Contrast Media] Anaphylaxis    Family  History:  Family History  Problem Relation Age of Onset   Heart attack Mother    Diabetes Mother    Heart attack Father    Hypertension Father    Heart attack Brother    Diabetes Brother      Current Outpatient Medications:    ACCU-CHEK AVIVA PLUS test strip, CHECK BLOOD SUGAR ONCE DAILY. DX E11.51, Disp: 100 strip, Rfl: 2   apixaban (ELIQUIS) 2.5 MG TABS tablet, Take 1 tablet (2.5 mg total) by mouth 2 (two) times daily., Disp: 180 tablet, Rfl: 1   atorvastatin (LIPITOR) 40 MG tablet, TAKE 1 TABLET BY MOUTH EVERY DAY, Disp: 90 tablet, Rfl: 0   Budeson-Glycopyrrol-Formoterol (BREZTRI AEROSPHERE) 160-9-4.8 MCG/ACT AERO, Inhale 2 puffs into the lungs in the morning and at bedtime., Disp: 10.7 g, Rfl: 12   Budeson-Glycopyrrol-Formoterol (BREZTRI AEROSPHERE) 160-9-4.8 MCG/ACT AERO, Inhale 2 puffs into the lungs in the morning and at bedtime., Disp: , Rfl:    Continuous Blood Gluc Receiver (DEXCOM G6 RECEIVER) DEVI, CHECK VLOOD SUGAR AS DIRECTED AS DIRECTED, Disp: 1 each, Rfl: 0   Continuous Blood Gluc Sensor (DEXCOM G6 SENSOR) MISC, APPLY 1 SENSOR EVERY 10 DAYS, Disp: 3 each, Rfl: 2   Continuous Blood Gluc Transmit (DEXCOM G6 TRANSMITTER) MISC, APPLY TO SENSOR EVERY 90 DAYS, Disp: 1 each, Rfl: 0   Cyanocobalamin (B-12 COMPLIANCE INJECTION IJ), Inject as directed every 30 (thirty) days., Disp: , Rfl:    dextromethorphan (DELSYM) 30 MG/5ML liquid, Take 5 mLs (30 mg total) by mouth 2 (two) times daily as needed for cough., Disp: 89 mL, Rfl: 1   diltiazem (CARDIZEM CD) 360 MG 24 hr capsule, TAKE 1 CAPSULE BY MOUTH EVERY DAY, Disp: 90 capsule, Rfl: 0   folic acid (FOLVITE) 1 MG tablet, Take 1 tablet (1 mg total) by mouth daily., Disp: 30 tablet, Rfl: 1   furosemide (LASIX) 20 MG tablet, Take 3 tablets (60 mg total) by mouth daily., Disp: 90 tablet, Rfl: 0   insulin degludec (TRESIBA FLEXTOUCH) 100 UNIT/ML FlexTouch Pen, INJECT 32 UNITS INTO THE SKIN AT BEDTIME, Disp: 15 mL, Rfl: 2    ipratropium-albuterol (DUONEB) 0.5-2.5 (3) MG/3ML SOLN, Take 3 mLs by nebulization every 4 (four) hours as needed (wheezing, shortness of breath, cough)., Disp: 360 mL, Rfl: 1   isosorbide mononitrate (IMDUR) 30 MG 24 hr tablet, Take 1 tablet (30 mg total) by mouth daily., Disp: 90 tablet, Rfl: 3   levalbuterol (XOPENEX HFA) 45 MCG/ACT inhaler, INHALE 1 TO 2 PUFFS BY MOUTH EVERY 6 HOURS AS NEEDED FOR WHEEZE [id number], Disp: 15 each, Rfl: 3   metFORMIN (GLUCOPHAGE) 1000 MG tablet, TAKE ONE TABLET BY MOUTH AT BREAKFAST AND AT BEDTIME, Disp: 180 tablet, Rfl: 0   metoprolol succinate (TOPROL XL) 50 MG 24 hr tablet, Take 1 tablet (50 mg total) by mouth daily. Take with or immediately following a meal., Disp:  90 tablet, Rfl: 3   nitroGLYCERIN (NITROSTAT) 0.4 MG SL tablet, Place 1 tablet (0.4 mg total) under the tongue every 5 (five) minutes as needed for chest pain. X 3 doses, Disp: 25 tablet, Rfl: 4   polyethylene glycol (MIRALAX / GLYCOLAX) packet, Take 17 g by mouth daily as needed for mild constipation., Disp: , Rfl:    QUEtiapine (SEROQUEL) 25 MG tablet, TAKE 0.5 TABLETS BY MOUTH AT BEDTIME., Disp: 45 tablet, Rfl: 2   rivastigmine (EXELON) 1.5 MG capsule, TAKE 1 CAPSULE (1.5 MG TOTAL) BY MOUTH TWICE A DAY, Disp: 180 capsule, Rfl: 2  Review of Systems:  Negative unless indicated in HPI.   Physical Exam: Vitals:   05/10/23 1505  BP: 110/62  Pulse: 82  Temp: 97.7 F (36.5 C)  TempSrc: Oral  SpO2: 94%  Weight: 157 lb 8 oz (71.4 kg)    Body mass index is 23.26 kg/m.   Physical Exam Cardiovascular:     Rate and Rhythm: Normal rate and regular rhythm.  Pulmonary:     Effort: Pulmonary effort is normal.     Breath sounds: Normal breath sounds.      Impression and Plan:  Chronic bronchitis, unspecified chronic bronchitis type [facility])  Hospital discharge follow-up  Hypokalemia -     Basic metabolic panel with GFR; Future  Permanent atrial fibrillation (HCC)  Chronic diastolic  congestive heart failure [facility])   -Emergency department records have been reviewed in detail. -Patient and wife agree that he is back to baseline. -She will call to schedule follow-up with his cardiologist. -Check BMP today to follow renal function and electrolytes given his known recent hypokalemia and increased furosemide dosing.  Time spent:32 minutes reviewing chart, interviewing and examining patient and formulating plan of care.     Chaya Jan, MD [facility] at [facility]

## 2023-05-11 ENCOUNTER — Telehealth: Payer: Self-pay | Admitting: *Deleted

## 2023-05-11 LAB — BASIC METABOLIC PANEL WITH GFR
BUN: 25 mg/dL — ABNORMAL HIGH (ref 6–23)
CO2: 24 meq/L (ref 19–32)
Calcium: 9.4 mg/dL (ref 8.4–10.5)
Chloride: 104 meq/L (ref 96–112)
Creatinine, Ser: 2.16 mg/dL — ABNORMAL HIGH (ref 0.40–1.50)
GFR: 27.82 mL/min — ABNORMAL LOW (ref >60.00)
Glucose, Bld: 162 mg/dL — ABNORMAL HIGH (ref 70–99)
Potassium: 3.7 meq/L (ref 3.5–5.1)
Sodium: 146 meq/L — ABNORMAL HIGH (ref 135–145)

## 2023-05-11 NOTE — Telephone Encounter (Signed)
 Medication list updated.

## 2023-06-07 ENCOUNTER — Other Ambulatory Visit: Payer: Self-pay | Admitting: Internal Medicine

## 2023-06-07 DIAGNOSIS — E1151 Type 2 diabetes mellitus with diabetic peripheral angiopathy without gangrene: Secondary | ICD-10-CM

## 2023-06-19 ENCOUNTER — Other Ambulatory Visit: Payer: Self-pay | Admitting: Nurse Practitioner

## 2023-06-19 DIAGNOSIS — J42 Unspecified chronic bronchitis: Secondary | ICD-10-CM

## 2023-06-25 ENCOUNTER — Other Ambulatory Visit: Payer: Self-pay | Admitting: Internal Medicine

## 2023-06-25 DIAGNOSIS — E785 Hyperlipidemia, unspecified: Secondary | ICD-10-CM

## 2023-06-25 DIAGNOSIS — I10 Essential (primary) hypertension: Secondary | ICD-10-CM

## 2023-06-30 ENCOUNTER — Ambulatory Visit: Payer: Medicare Other | Admitting: Neurology

## 2023-07-19 ENCOUNTER — Other Ambulatory Visit: Payer: Self-pay | Admitting: Cardiology

## 2023-07-26 ENCOUNTER — Encounter: Payer: Self-pay | Admitting: Neurology

## 2023-07-26 ENCOUNTER — Ambulatory Visit: Admitting: Neurology

## 2023-07-26 VITALS — BP 166/97 | HR 78 | Ht 68.0 in | Wt 153.5 lb

## 2023-07-26 DIAGNOSIS — F02A3 Dementia in other diseases classified elsewhere, mild, with mood disturbance: Secondary | ICD-10-CM | POA: Diagnosis not present

## 2023-07-26 DIAGNOSIS — F32A Depression, unspecified: Secondary | ICD-10-CM

## 2023-07-26 DIAGNOSIS — G301 Alzheimer's disease with late onset: Secondary | ICD-10-CM | POA: Diagnosis not present

## 2023-07-26 MED ORDER — CITALOPRAM HYDROBROMIDE 10 MG PO TABS: 10.0000 mg | ORAL_TABLET | Freq: Every day | ORAL | 3 refills | Status: DC

## 2023-07-26 MED ORDER — MEMANTINE HCL 10 MG PO TABS: 10.0000 mg | ORAL_TABLET | Freq: Two times a day (BID) | ORAL | 3 refills | Status: DC

## 2023-07-26 MED ORDER — RIVASTIGMINE TARTRATE 3 MG PO CAPS: 3.0000 mg | ORAL_CAPSULE | Freq: Two times a day (BID) | ORAL | 3 refills | Status: DC

## 2023-07-26 NOTE — Progress Notes (Signed)
 [facility]  PATIENT: Jason Fisher DOB: 1940/08/15  REQUESTING CLINICIAN: [name], [place]* HISTORY FROM: Patient, spouse and chart review  REASON FOR VISIT: Memory loss    HISTORICAL  CHIEF COMPLAINT:  Chief Complaint  Patient presents with   Memory Loss    Rm12, wife present, Memory loss: moca score of 14   INTERVAL HISTORY 07/26/2023 Patient presents today for follow-up, he is accompanied by wife.  I last visit, we obtained a ATN profile which showed presence of Alzheimer disease biomarker.  We also obtain MRI brain which showed finding consistent with Alzheimer disease.  He was started on Exelon  1.5 mg twice daily, denies any side effect from the medication.  Wife tells me that his memory is declining, he is needing more with showering, dressing himself.  He is able to recognize her and family members.  He still has hallucinations and vivid dreams.  He was put on Seroquel  12.5 mg nightly but wife tells me the medication makes patient very sleepy so she stopped it and has been using melatonin which is helpful. Wife also mentioned that patient is more depressed, he cries more often and easily.He has been falling, last fall was last month, they denied any major injuries    HISTORY OF PRESENT ILLNESS:  This is a 83 year old gentleman with multiple medical multiple conditions including hypertension, hyperlipidemia, diabetes mellitus type 2, coronary artery disease, atrial fibrillation on apixaban , tobacco use and CKD stage III who is presenting with memory problem.  Patient reported his memory is not what it used to be.  He reports forgetful, misplacing items but since coming back from the hospital his memory got worse.  In March patient was admitted to the hospital for worsening COPD and pneumonia.  Wife reports prior to hospitalization patient was being forgetful also there was report of hallucinations.  He will see bugs and insects crawling on the wall, also  seeing small children in the house.  Since discharge from the hospital patient reported he still has hallucinations but is less than before.  The last episode was this morning when he he saw scissor on the bed.   Again wife reports that since discharge he is more forgetful, he repeat himself and wife also has a repeat herself because he asks the same questions over and over.  Patient has not been driving for the past year, when he was driving he denied any accident or being lost in familiar places. He felt like he could not drive anymore.  Same thing for the bills.  He left the wife take over the bill last year.  He denies missing any payments he felt like the wife should know how to handle the bills also. He denies any family history of dementia and there is reports of patient acting out his dream, screaming and punching. They denies recent falls, he does use a cane    TBI:  No past history of TBI Stroke:   no past history of stroke Seizures:   no past history of seizures Sleep:   no history of sleep apnea.   Mood:   patient denies anxiety and depression Family history of Dementia:   Denies  Functional status: independent in most ADLs and IADLs Patient lives with spouse. Cooking: not anymore  Cleaning: yes Shopping: Yes Bathing: Needs help  Toileting: Needs help  Driving: No, stopped driving 1 year  Bills: Wife took over the bills 1 year  Medications: no issues Ever left the stove on  by accident?: Denies  Forget how to use items around the house?: denies  Getting lost going to familiar places?: Denies  Forgetting loved ones names?: Denies  Word finding difficulty? Yes  Sleep: Some nights does not sleep and he does scream and punch during sleep    OTHER MEDICAL CONDITIONS: Hypertension, hyperlipidemia, diabetes mellitus type 2, coronary artery disease, atrial fibrillation on apixaban , tobacco use and CKD stage III   REVIEW OF SYSTEMS: Full 14 system review of systems performed and  negative with exception of: As noted in the HPI  ALLERGIES: Allergies  Allergen Reactions   Contrast Media [Iodinated Contrast Media] Anaphylaxis    HOME MEDICATIONS: Outpatient Medications Prior to Visit  Medication Sig Dispense Refill   ACCU-CHEK AVIVA PLUS test strip CHECK BLOOD SUGAR ONCE DAILY. DX E11.51 100 strip 2   apixaban  (ELIQUIS ) 2.5 MG TABS tablet Take 1 tablet (2.5 mg total) by mouth 2 (two) times daily. 180 tablet 1   atorvastatin  (LIPITOR) 40 MG tablet TAKE 1 TABLET BY MOUTH EVERY DAY 90 tablet 2   Budeson-Glycopyrrol-Formoterol  (BREZTRI  AEROSPHERE) 160-9-4.8 MCG/ACT AERO Inhale 2 puffs into the lungs in the morning and at bedtime. 10.7 g 12   Budeson-Glycopyrrol-Formoterol  (BREZTRI  AEROSPHERE) 160-9-4.8 MCG/ACT AERO Inhale 2 puffs into the lungs in the morning and at bedtime.     Continuous Blood Gluc Receiver (DEXCOM G6 RECEIVER) DEVI CHECK VLOOD SUGAR AS DIRECTED AS DIRECTED 1 each 0   Continuous Blood Gluc Sensor (DEXCOM G6 SENSOR) MISC APPLY 1 SENSOR EVERY 10 DAYS 3 each 2   Continuous Blood Gluc Transmit (DEXCOM G6 TRANSMITTER) MISC APPLY TO SENSOR EVERY 90 DAYS 1 each 0   Cyanocobalamin  (B-12 COMPLIANCE INJECTION IJ) Inject as directed every 30 (thirty) days.     dextromethorphan  (DELSYM ) 30 MG/5ML liquid Take 5 mLs (30 mg total) by mouth 2 (two) times daily as needed for cough. 89 mL 1   diltiazem  (CARDIZEM  CD) 360 MG 24 hr capsule TAKE 1 CAPSULE BY MOUTH EVERY DAY 90 capsule 2   folic acid  (FOLVITE ) 1 MG tablet Take 1 tablet (1 mg total) by mouth daily. 30 tablet 1   furosemide  (LASIX ) 20 MG tablet Take 3 tablets (60 mg total) by mouth daily. (Patient taking differently: Take 40 mg by mouth 2 (two) times daily.) 90 tablet 0   insulin  degludec (TRESIBA  FLEXTOUCH) 100 UNIT/ML FlexTouch Pen INJECT 32 UNITS INTO THE SKIN AT BEDTIME 15 mL 2   ipratropium-albuterol  (DUONEB) 0.5-2.5 (3) MG/3ML SOLN Take 3 mLs by nebulization every 4 (four) hours as needed (wheezing,  shortness of breath, cough). 360 mL 1   isosorbide  mononitrate (IMDUR ) 30 MG 24 hr tablet TAKE 1 TABLET BY MOUTH EVERY DAY 90 tablet 2   levalbuterol  (XOPENEX  HFA) 45 MCG/ACT inhaler INHALE 1 TO 2 PUFFS BY MOUTH EVERY 6 HOURS AS NEEDED FOR WHEEZE DXJ42 15 each 3   metFORMIN  (GLUCOPHAGE ) 1000 MG tablet TAKE ONE TABLET BY MOUTH AT BREAKFAST AND AT BEDTIME 180 tablet 0   metoprolol  succinate (TOPROL  XL) 50 MG 24 hr tablet Take 1 tablet (50 mg total) by mouth daily. Take with or immediately following a meal. 90 tablet 3   nitroGLYCERIN  (NITROSTAT ) 0.4 MG SL tablet Place 1 tablet (0.4 mg total) under the tongue every 5 (five) minutes as needed for chest pain. X 3 doses 25 tablet 4   polyethylene glycol (MIRALAX  / GLYCOLAX ) packet Take 17 g by mouth daily as needed for mild constipation.     QUEtiapine  (SEROQUEL ) 25  MG tablet TAKE 0.5 TABLETS BY MOUTH AT BEDTIME. 45 tablet 2   rivastigmine  (EXELON ) 1.5 MG capsule TAKE 1 CAPSULE (1.5 MG TOTAL) BY MOUTH TWICE A DAY 180 capsule 2   No facility-administered medications prior to visit.    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 2007 Inf STEMI s/p Vision BMS  2 to RCA;  b. 2009 ISR Prox RCA Stent-->with DES;  c. 09/2015 PCI: LM nl, LAD 30p/m, D1 90, LCX nl, OM1 small, 80, OM2 90 (2.5x14 Biofreedom stent), OM3 small, RCA 30p ISR, 39m ISR, 9m, 85d, EF 50-55%.   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 On Eliquis  (CHA2DS2VASc = 5).  Rate controlled w/ BB and CCB.   Chronic diastolic CHF (congestive heart failure) (HCC)    a. 09/2015 Echo: EF 55-60%, mildly dil LA.   CKD (chronic kidney disease), stage III (HCC)    they mentioned this to the patient but they would work on it later after the aneurysm   COLONIC POLYPS, HX OF 11/02/2006   COPD (chronic obstructive pulmonary disease)  (HCC)    Dementia (HCC)    DIABETES MELLITUS, TYPE II 10/29/2006   Diverticulosis    History of tobacco abuse    a. Quit 09/2015.   HYPERLIPIDEMIA 10/29/2006   Hypertension    Hypertensive heart disease with heart failure (HCC)    Pneumonia 09/2015   thelbert 07/31/2016   STEMI (ST elevation myocardial infarction) [facility]) 2007   thelbert 07/31/2016    PAST SURGICAL HISTORY: Past Surgical History:  Procedure Laterality Date   ABDOMINAL AORTIC ENDOVASCULAR STENT GRAFT N/A 07/31/2016   Procedure: ABDOMINAL AORTIC ENDOVASCULAR STENT GRAFT;  Surgeon: Serene Gaile ORN, MD;  Location: [facility] OR;  Service: Vascular;  Laterality: N/A;   CARDIAC CATHETERIZATION N/A 09/26/2015   Procedure: Left Heart Cath and Coronary Angiography;  Surgeon: Peter M Swaziland, MD;  Location: [facility] INVASIVE CV LAB;  Service: Cardiovascular;  Laterality: N/A;   CARDIAC CATHETERIZATION N/A 09/26/2015   Procedure: Coronary Stent Intervention;  Surgeon: Peter M Swaziland, MD;  Location: [facility] INVASIVE CV LAB;  Service: Cardiovascular;  Laterality: N/A;   CATARACT EXTRACTION Bilateral    COLONOSCOPY     CORONARY ANGIOPLASTY WITH STENT PLACEMENT  2007   Inferior STEMI 2007 - RCA PCI with 2 Vision BMS; Abnormal Myoview in 2009 - pRCA ins-stent restenosis & unstented disease - DES PCI Promus 3.0 x 20 mm (3.5 mm)   ENDOVASCULAR REPAIR/STENT GRAFT  07/31/2016   INCISION AND DRAINAGE PERIRECTAL ABSCESS N/A 11/06/2015   Procedure: IRRIGATION AND DEBRIDEMENT PERIRECTAL ABSCESS, POSSIBLE HEMORRHOIDECTOMY;  Surgeon: Vicenta Poli, MD;  Location: [facility] OR;  Service: General;  Laterality: N/A;   ORIF SHOULDER FRACTURE Right    fell out of back end of a truck    FAMILY HISTORY: Family History  Problem Relation Age of Onset   Heart attack Mother    Diabetes Mother    Heart attack Father    Hypertension Father    Heart attack Brother    Diabetes Brother     SOCIAL HISTORY: Social History   Socioeconomic History   Marital status: Married     Spouse name: Not on file   Number of children: 5   Years of education: Not on file   Highest education level: Not on file  Occupational History   Occupation: [employer]  Tobacco Use   Smoking status: Former    Current packs/day: 0.00    Average packs/day: 1 pack/day for 49.0 years (49.0 ttl pk-yrs)    Types: Cigarettes    Start date: 09/08/1966    Quit date: 09/08/2015    Years since quitting: 7.8    Passive exposure: Current   Smokeless tobacco: Never   Tobacco comments:    passive smoker from 2017-2024  Vaping Use   Vaping status: Never Used  Substance and Sexual Activity   Alcohol use: No    Alcohol/week: 0.0 standard drinks of alcohol   Drug use: No   Sexual activity: Yes    Birth control/protection: None  Other Topics Concern   Not on file  Social History Narrative   Not on file   Social Drivers of Health   Financial Resource Strain: Medium Risk (09/23/2020)   Overall Financial Resource Strain (CARDIA)    Difficulty of Paying Living Expenses: Somewhat hard  Food Insecurity: No Food Insecurity (05/05/2022)   Hunger Vital Sign    Worried About Running Out of Food in the Last Year: Never true    Ran Out of Food in the Last Year: Never true  Transportation Needs: No Transportation Needs (05/05/2022)   PRAPARE - Administrator, Civil Service (Medical): No    Lack of Transportation (Non-Medical): No  Physical Activity: Sufficiently Active (06/08/2019)   Exercise Vital Sign    Days of Exercise per Week: 3 days    Minutes of Exercise per Session: 60 min  Stress: Not on file  Social Connections: Not on file  Intimate Partner Violence: Not At Risk (04/30/2022)   Humiliation, Afraid, Rape, and Kick questionnaire    Fear of Current or Ex-Partner: No    Emotionally Abused: No    Physically Abused: No    Sexually Abused: No    PHYSICAL EXAM  GENERAL EXAM/CONSTITUTIONAL: Vitals:  Vitals:   07/26/23 1005 07/26/23 1012  BP: (!) 171/79 (!) 166/97  Pulse: 78    Weight: 153 lb 8 oz (69.6 kg)   Height: 5' 8 (1.727 m)    Body mass index is 23.34 kg/m. Wt Readings from Last 3 Encounters:  07/26/23 153 lb 8 oz (69.6 kg)  05/10/23 157 lb 8 oz (71.4 kg)  05/03/23 162 lb 11.2 oz (73.8 kg)   Patient is in no distress; well developed, nourished and groomed; neck is supple  MUSCULOSKELETAL: Gait, strength, tone, movements noted in Neurologic exam below  NEUROLOGIC: MENTAL STATUS:      No data to display            07/26/2023   10:06 AM 06/30/2022    8:02 AM  Montreal Cognitive Assessment   Visuospatial/ Executive (0/5) 1 1  Naming (0/3) 3 3  Attention: Read list of digits (0/2) 1 2  Attention: Read list of letters (0/1) 1 1  Attention: Serial 7 subtraction starting at 100 (0/3) 1 1  Language: Repeat phrase (0/2) 1 1  Language : Fluency (0/1) 0 0  Abstraction (0/2) 2 1  Delayed Recall (0/5) 0 0  Orientation (0/6) 4 3  Total 14 13     CRANIAL NERVE:  2nd, 3rd, 4th, 6th- visual fields full to confrontation, extraocular muscles intact, no nystagmus 5th - facial sensation symmetric 7th - facial strength symmetric 8th - hearing intact 9th - palate elevates symmetrically, uvula midline 11th - shoulder shrug symmetric 12th - tongue protrusion midline  MOTOR:  normal bulk and tone, full strength in the BUE, BLE  SENSORY:  normal and symmetric to light touch  COORDINATION:  finger-nose-finger, fine finger movements normal  GAIT/STATION:  Uses a cane but today ambulates without a device     DIAGNOSTIC DATA (LABS, IMAGING, TESTING) - I reviewed patient records, labs, notes, testing and imaging myself where available.  Lab Results  Component Value Date   WBC 8.7 05/08/2023   HGB 10.9 (L) 05/08/2023   HCT 32.0 (L) 05/08/2023   MCV 88.0 05/08/2023   PLT 217 05/08/2023      Component Value Date/Time   NA 146 (H) 05/10/2023 1539   NA 147 (H) 03/10/2023 1108   K 3.7 05/10/2023 1539   CL 104 05/10/2023 1539   CO2 24  05/10/2023 1539   GLUCOSE 162 (H) 05/10/2023 1539   BUN 25 (H) 05/10/2023 1539   BUN 25 03/10/2023 1108   CREATININE 2.16 (H) 05/10/2023 1539   CALCIUM  9.4 05/10/2023 1539   PROT 7.5 05/03/2023 2238   PROT 6.9 03/10/2023 1108   ALBUMIN 3.6 05/03/2023 2238   ALBUMIN 4.3 03/10/2023 1108   AST 41 05/03/2023 2238   ALT 23 05/03/2023 2238   ALKPHOS 69 05/03/2023 2238   BILITOT 0.9 05/03/2023 2238   BILITOT 0.6 03/10/2023 1108   GFRNONAA 29 (L) 05/08/2023 2028   GFRAA 56 (L) 03/06/2019 1404   Lab Results  Component Value Date   CHOL 135 03/10/2023   HDL 43 03/10/2023   LDLCALC 72 03/10/2023   TRIG 106 03/10/2023   CHOLHDL 3.1 03/10/2023   Lab Results  Component Value Date   HGBA1C 5.9 (A) 03/31/2023   Lab Results  Component Value Date   VITAMINB12 629 01/11/2023   Lab Results  Component Value Date   TSH 2.020 06/30/2022   ATN Profile positive for Alzheimer disease pathology.   MRI Brain 08/07/2022 This MRI of the brain without contrast shows the following: Scattered T2/FLAIR hyperintense foci in the hemispheres consistent with mild to moderate chronic microvascular ischemic change. Mild generalized cortical atrophy, most pronounced in the medial temporal lobes.  Though not completely specific, in patients with cognitive impairment, this pattern is often seen with Alzheimer's disease. No acute findings  ASSESSMENT AND PLAN  83 y.o. year old male with dementia, hypertension, hyperlipidemia, diabetes mellitus type 2, coronary artery disease, atrial fibrillation on apixaban , tobacco use and CKD stage III who is presenting with wife for follow-up for his Alzheimer dementia.  His ATN was positive for Alzheimer disease biomarker, his MRI pattern is seen in patient with Alzheimer disease.  He is currently on Exelon  1.5 mg twice daily, will increase to 3 mg twice daily and I will add Namenda.  Explained to wife the nature of the disease, that over time, memory will get worse but we are  trying to slow it down with medicine. Advised them to contact us  if he is having more behavior problems.  I will put him on low-dose Celexa for his depression.  Advised him to continue following up with PCP and to return to us  as needed.   1. 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 Depression, unspecified depression type      Patient Instructions  Increase Exelon  to 3 mg twice daily  Start Namenda to 10 mg twice daily  Continue your other medications  Continue to follow up PCP  Return as needed   No orders of the defined types were placed  in this encounter.   Meds ordered this encounter  Medications   rivastigmine  (EXELON ) 3 MG capsule    Sig: Take 1 capsule (3 mg total) by mouth 2 (two) times daily.    Dispense:  180 capsule    Refill:  3   memantine (NAMENDA) 10 MG tablet    Sig: Take 1 tablet (10 mg total) by mouth 2 (two) times daily.    Dispense:  180 tablet    Refill:  3   citalopram (CELEXA) 10 MG tablet    Sig: Take 1 tablet (10 mg total) by mouth daily.    Dispense:  90 tablet    Refill:  3    Return if symptoms worsen or fail to improve.  I have spent a total of 30 minutes dedicated to this patient today, preparing to see patient, performing a medically appropriate examination and evaluation, ordering tests and/or medications and procedures, and counseling and educating the patient/family/caregiver; independently interpreting result and communicating results to the family/patient/caregiver; and documenting clinical information in the electronic medical record.   Pastor Falling, MD 07/26/2023, 11:09 AM  [facility] [address] [place], [place] [address] [phone]

## 2023-07-26 NOTE — Patient Instructions (Signed)
 Increase Exelon  to 3 mg twice daily  Start Namenda to 10 mg twice daily  Continue your other medications  Continue to follow up PCP  Return as needed

## 2023-08-06 ENCOUNTER — Other Ambulatory Visit: Payer: Self-pay | Admitting: Internal Medicine

## 2023-08-06 DIAGNOSIS — I5033 Acute on chronic diastolic (congestive) heart failure: Secondary | ICD-10-CM

## 2023-08-11 ENCOUNTER — Other Ambulatory Visit: Payer: Self-pay

## 2023-08-11 ENCOUNTER — Emergency Department (HOSPITAL_BASED_OUTPATIENT_CLINIC_OR_DEPARTMENT_OTHER)
Admission: EM | Admit: 2023-08-11 | Discharge: 2023-08-11 | Disposition: A | Discharge status: Home / Self Care | Attending: Emergency Medicine | Admitting: Emergency Medicine

## 2023-08-11 DIAGNOSIS — Z794 Long term (current) use of insulin: Secondary | ICD-10-CM | POA: Diagnosis not present

## 2023-08-11 DIAGNOSIS — Z7984 Long term (current) use of oral hypoglycemic drugs: Secondary | ICD-10-CM | POA: Diagnosis not present

## 2023-08-11 DIAGNOSIS — Z7901 Long term (current) use of anticoagulants: Secondary | ICD-10-CM | POA: Diagnosis not present

## 2023-08-11 DIAGNOSIS — R339 Retention of urine, unspecified: Secondary | ICD-10-CM | POA: Insufficient documentation

## 2023-08-11 DIAGNOSIS — N289 Disorder of kidney and ureter, unspecified: Secondary | ICD-10-CM | POA: Diagnosis not present

## 2023-08-11 LAB — BASIC METABOLIC PANEL WITH GFR
Anion gap: 13 (ref 5–15)
BUN: 19 mg/dL (ref 8–23)
CO2: 23 mmol/L (ref 22–32)
Calcium: 9.8 mg/dL (ref 8.9–10.3)
Chloride: 108 mmol/L (ref 98–111)
Creatinine, Ser: 1.91 mg/dL — ABNORMAL HIGH (ref 0.61–1.24)
GFR, Estimated: 35 mL/min — ABNORMAL LOW (ref >60)
Glucose, Bld: 209 mg/dL — ABNORMAL HIGH (ref 70–99)
Potassium: 4.3 mmol/L (ref 3.5–5.1)
Sodium: 144 mmol/L (ref 135–145)

## 2023-08-11 LAB — URINALYSIS, ROUTINE W REFLEX MICROSCOPIC
Bilirubin Urine: NEGATIVE
Glucose, UA: NEGATIVE mg/dL
Ketones, ur: NEGATIVE mg/dL
Leukocytes,Ua: NEGATIVE
Nitrite: NEGATIVE
Protein, ur: 100 mg/dL — AB
Specific Gravity, Urine: 1.02 (ref 1.005–1.030)
pH: 5.5 (ref 5.0–8.0)

## 2023-08-11 LAB — CBC WITH DIFFERENTIAL/PLATELET
Abs Immature Granulocytes: 0.04 K/uL (ref 0.00–0.07)
Basophils Absolute: 0 K/uL (ref 0.0–0.1)
Basophils Relative: 0 %
Eosinophils Absolute: 0 K/uL (ref 0.0–0.5)
Eosinophils Relative: 0 %
HCT: 35.7 % — ABNORMAL LOW (ref 39.0–52.0)
Hemoglobin: 11.1 g/dL — ABNORMAL LOW (ref 13.0–17.0)
Immature Granulocytes: 0 %
Lymphocytes Relative: 8 %
Lymphs Abs: 0.7 K/uL (ref 0.7–4.0)
MCH: 26.7 pg (ref 26.0–34.0)
MCHC: 31.1 g/dL (ref 30.0–36.0)
MCV: 86 fL (ref 80.0–100.0)
Monocytes Absolute: 0.6 K/uL (ref 0.1–1.0)
Monocytes Relative: 7 %
Neutro Abs: 7.9 K/uL — ABNORMAL HIGH (ref 1.7–7.7)
Neutrophils Relative %: 85 %
Platelets: 203 K/uL (ref 150–400)
RBC: 4.15 MIL/uL — ABNORMAL LOW (ref 4.22–5.81)
RDW: 16.5 % — ABNORMAL HIGH (ref 11.5–15.5)
WBC: 9.3 K/uL (ref 4.0–10.5)
nRBC: 0 % (ref 0.0–0.2)

## 2023-08-11 MED ORDER — TAMSULOSIN HCL 0.4 MG PO CAPS
0.4000 mg | ORAL_CAPSULE | ORAL | Status: AC
  Administered 2023-08-11: 0.4 mg via ORAL
  Filled 2023-08-11: qty 1

## 2023-08-11 MED ORDER — TAMSULOSIN HCL 0.4 MG PO CAPS: 0.4000 mg | ORAL_CAPSULE | Freq: Every day | ORAL | 0 refills | Status: DC

## 2023-08-11 NOTE — ED Triage Notes (Signed)
 Pt POV with son and wife d/t urinary retention for 3 days.  P has had dribbling but not much.  Pt has hx of dementia and COPD.  He is on 4L O2.  When he arrived he was only 81% on RA

## 2023-08-11 NOTE — ED Provider Notes (Signed)
 [place] EMERGENCY DEPARTMENT AT [facility] Provider Note   CSN: [id number] Arrival date & time: 08/11/23  9470     Patient presents with: Urinary Retention   Jason Fisher is a 83 y.o. male.   The history is provided by the spouse and a relative. The history is limited by the condition of the patient (level 5 caveat dementia).  Illness Location:  Bladder Quality:  Dribbling markedly decreased UOP Severity:  Moderate Onset quality:  Gradual Duration:  3 days Timing:  Constant Progression:  Unchanged Chronicity:  New Relieved by:  Nothing Worsened by:  Nothing Ineffective treatments:  None Associated symptoms: no congestion and no fever        Prior to Admission medications   Medication Sig Start Date End Date Taking? Authorizing Provider  tamsulosin  (FLOMAX ) 0.4 MG CAPS capsule Take 1 capsule (0.4 mg total) by mouth daily. 08/11/23  Yes Landon Truax, MD  ACCU-CHEK AVIVA PLUS test strip CHECK BLOOD SUGAR ONCE DAILY. DX E11.51 06/29/19   [name], [name], MD  apixaban  (ELIQUIS ) 2.5 MG TABS tablet Take 1 tablet (2.5 mg total) by mouth 2 (two) times daily. 04/09/23   [place], Peter M, MD  atorvastatin  (LIPITOR) 40 MG tablet TAKE 1 TABLET BY MOUTH EVERY DAY 06/25/23   [name], [name], MD  Budeson-Glycopyrrol-Formoterol  (BREZTRI  AEROSPHERE) 160-9-4.8 MCG/ACT AERO Inhale 2 puffs into the lungs in the morning and at bedtime. 11/02/22   Cobb, Comer GAILS, NP  Budeson-Glycopyrrol-Formoterol  (BREZTRI  AEROSPHERE) 160-9-4.8 MCG/ACT AERO Inhale 2 puffs into the lungs in the morning and at bedtime. 04/01/23   Shelah Lamar RAMAN, MD  citalopram  (CELEXA ) 10 MG tablet Take 1 tablet (10 mg total) by mouth daily. 07/26/23   Camara, Amadou, MD  Continuous Blood Gluc Receiver (DEXCOM G6 RECEIVER) DEVI CHECK VLOOD SUGAR AS DIRECTED AS DIRECTED 10/23/21   [name], [name], MD  Continuous Blood Gluc Sensor (DEXCOM G6 SENSOR) MISC APPLY 1 SENSOR EVERY 10 DAYS 10/14/21    [name], [name], MD  Continuous Blood Gluc Transmit (DEXCOM G6 TRANSMITTER) MISC APPLY TO SENSOR EVERY 90 DAYS 10/23/21   [name], [name], MD  Cyanocobalamin  (B-12 COMPLIANCE INJECTION IJ) Inject as directed every 30 (thirty) days.    [provider]  dextromethorphan  (DELSYM ) 30 MG/5ML liquid Take 5 mLs (30 mg total) by mouth 2 (two) times daily as needed for cough. 05/02/22   Johnson, Clanford L, MD  diltiazem  (CARDIZEM  CD) 360 MG 24 hr capsule TAKE 1 CAPSULE BY MOUTH EVERY DAY 06/25/23   [name], [name], MD  folic acid  (FOLVITE ) 1 MG tablet Take 1 tablet (1 mg total) by mouth daily. 05/03/22   Johnson, Clanford L, MD  furosemide  (LASIX ) 20 MG tablet Take 3 tablets (60 mg total) by mouth daily. Patient taking differently: Take 40 mg by mouth 2 (two) times daily. 05/03/23   Francesca Elsie CROME, MD  insulin  degludec (TRESIBA  FLEXTOUCH) 100 UNIT/ML FlexTouch Pen INJECT 32 UNITS INTO THE SKIN AT BEDTIME 03/15/23   [name], [name], MD  ipratropium-albuterol  (DUONEB) 0.5-2.5 (3) MG/3ML SOLN Take 3 mLs by nebulization every 4 (four) hours as needed (wheezing, shortness of breath, cough). 05/02/22   Johnson, Clanford L, MD  isosorbide  mononitrate (IMDUR ) 30 MG 24 hr tablet TAKE 1 TABLET BY MOUTH EVERY DAY 07/20/23   [place], Peter M, MD  levalbuterol  (XOPENEX  HFA) 45 MCG/ACT inhaler INHALE 1 TO 2 PUFFS BY MOUTH EVERY 6 HOURS AS NEEDED FOR WHEEZE [id number]  06/21/23   Cobb, Comer GAILS, NP  memantine  (NAMENDA ) 10 MG tablet Take 1 tablet (10 mg total) by mouth 2 (two) times daily. 07/26/23   Camara, Amadou, MD  metFORMIN  (GLUCOPHAGE ) 1000 MG tablet TAKE ONE TABLET BY MOUTH AT [facility] AND AT BEDTIME 06/07/23   [name], [name], MD  metoprolol  succinate (TOPROL  XL) 50 MG 24 hr tablet Take 1 tablet (50 mg total) by mouth daily. Take with or immediately following a meal. 03/10/23   [place], Peter M, MD  nitroGLYCERIN  (NITROSTAT ) 0.4 MG SL tablet Place 1 tablet (0.4 mg  total) under the tongue every 5 (five) minutes as needed for chest pain. X 3 doses 06/09/21   [name], Estela Y, MD  polyethylene glycol (MIRALAX  / GLYCOLAX ) packet Take 17 g by mouth daily as needed for mild constipation.    [provider]  QUEtiapine  (SEROQUEL ) 25 MG tablet TAKE 0.5 TABLETS BY MOUTH AT BEDTIME. 07/06/22   [name], [name], MD  rivastigmine  (EXELON ) 3 MG capsule Take 1 capsule (3 mg total) by mouth 2 (two) times daily. 07/26/23   Camara, Amadou, MD    Allergies: Contrast media [iodinated contrast media]    Review of Systems  Unable to perform ROS: Dementia  Constitutional:  Negative for fever.  HENT:  Negative for congestion.   Genitourinary:  Positive for difficulty urinating.    Updated Vital Signs BP (!) 173/99   Pulse (!) 117   Temp 97.7 F (36.5 C) (Oral)   Resp (!) 22   Ht 5' 8 (1.727 m)   Wt 69.3 kg   SpO2 100%   BMI 23.22 kg/m   Physical Exam Vitals and nursing note reviewed. Exam conducted with a chaperone present.  Constitutional:      General: He is not in acute distress.    Appearance: Normal appearance. He is well-developed. He is not diaphoretic.  HENT:     Head: Normocephalic and atraumatic.     Nose: Nose normal.  Eyes:     Conjunctiva/sclera: Conjunctivae normal.     Pupils: Pupils are equal, round, and reactive to light.  Cardiovascular:     Rate and Rhythm: Normal rate and regular rhythm.     Pulses: Normal pulses.     Heart sounds: Normal heart sounds.  Pulmonary:     Effort: Pulmonary effort is normal.     Breath sounds: Normal breath sounds. No wheezing or rales.  Abdominal:     General: Bowel sounds are normal.     Palpations: Abdomen is soft.     Tenderness: There is no abdominal tenderness. There is no guarding or rebound.     Comments: Bladder palpable   Musculoskeletal:        General: Normal range of motion.     Cervical back: Normal range of motion and neck supple.  Skin:    General: Skin is  warm and dry.     Capillary Refill: Capillary refill takes less than 2 seconds.  Neurological:     Mental Status: He is alert.     Deep Tendon Reflexes: Reflexes normal.  Psychiatric:     Comments: Resting comfortably      (all labs ordered are listed, but only abnormal results are displayed) Results for orders placed or performed during the hospital encounter of 08/11/23  CBC with Differential   Collection Time: 08/11/23  5:54 AM  Result Value Ref Range   WBC 9.3 4.0 - 10.5 K/uL   RBC 4.15 (L)  4.22 - 5.81 MIL/uL   Hemoglobin 11.1 (L) 13.0 - 17.0 g/dL   HCT 64.2 (L) 60.9 - 47.9 %   MCV 86.0 80.0 - 100.0 fL   MCH 26.7 26.0 - 34.0 pg   MCHC 31.1 30.0 - 36.0 g/dL   RDW 83.4 (H) 88.4 - 84.4 %   Platelets 203 150 - 400 K/uL   nRBC 0.0 0.0 - 0.2 %   Neutrophils Relative % 85 %   Neutro Abs 7.9 (H) 1.7 - 7.7 K/uL   Lymphocytes Relative 8 %   Lymphs Abs 0.7 0.7 - 4.0 K/uL   Monocytes Relative 7 %   Monocytes Absolute 0.6 0.1 - 1.0 K/uL   Eosinophils Relative 0 %   Eosinophils Absolute 0.0 0.0 - 0.5 K/uL   Basophils Relative 0 %   Basophils Absolute 0.0 0.0 - 0.1 K/uL   Immature Granulocytes 0 %   Abs Immature Granulocytes 0.04 0.00 - 0.07 K/uL  Urinalysis, Routine w reflex microscopic -Urine, Clean Catch   Collection Time: 08/11/23  5:54 AM  Result Value Ref Range   Color, Urine YELLOW YELLOW   APPearance CLEAR CLEAR   Specific Gravity, Urine 1.020 1.005 - 1.030   pH 5.5 5.0 - 8.0   Glucose, UA NEGATIVE NEGATIVE mg/dL   Hgb urine dipstick SMALL (A) NEGATIVE   Bilirubin Urine NEGATIVE NEGATIVE   Ketones, ur NEGATIVE NEGATIVE mg/dL   Protein, ur 899 (A) NEGATIVE mg/dL   Nitrite NEGATIVE NEGATIVE   Leukocytes,Ua NEGATIVE NEGATIVE   RBC / HPF 0-5 0 - 5 RBC/hpf   WBC, UA 0-5 0 - 5 WBC/hpf   Bacteria, UA RARE (A) NONE SEEN   Squamous Epithelial / HPF 0-5 0 - 5 /HPF   Mucus PRESENT    Hyaline Casts, UA PRESENT    Crystals PRESENT (A) NEGATIVE   No results  found.  Radiology: No results found.   Procedures   Medications Ordered in the ED  tamsulosin  (FLOMAX ) capsule 0.4 mg (has no administration in time range)                                    Medical Decision Making Patient with decreased UOP x 3 days   Amount and/or Complexity of Data Reviewed Independent Historian: spouse    Details: And son see above  External Data Reviewed: notes.    Details: Previous notes reviewed  Labs: ordered.    Details: Urine negative for UTI.  White count is normal 9.3, hemoglobin slight low 11.1, normal platelets.  Creatinine is elevated but below previous levels 1.91  Risk Prescription drug management. Risk Details: Foley catheter in place.  No UTI.  Will start flomax  for prostate.  Family to call urology this am for voiding trial in the office in 7 days. Stable for discharge.       Final diagnoses:  Urinary retention   o signs of systemic illness or infection. The patient is nontoxic-appearing on exam and vital signs are within normal limits.  I have reviewed the triage vital signs and the nursing notes. Pertinent labs & imaging results that were available during my care of the patient were reviewed by me and considered in my medical decision making (see chart for details). After history, exam, and medical workup I feel the patient has been appropriately medically screened and is safe for discharge home. Pertinent diagnoses were discussed with the patient. Patient was given  return precautions.    ED Discharge Orders          Ordered    tamsulosin  (FLOMAX ) 0.4 MG CAPS capsule  Daily        08/11/23 0547               Palmer Shorey, MD 08/11/23 9372

## 2023-08-12 ENCOUNTER — Other Ambulatory Visit: Payer: Self-pay | Admitting: Internal Medicine

## 2023-08-12 DIAGNOSIS — I5033 Acute on chronic diastolic (congestive) heart failure: Secondary | ICD-10-CM

## 2023-08-12 MED ORDER — FUROSEMIDE 20 MG PO TABS: 60.0000 mg | ORAL_TABLET | Freq: Every day | ORAL | 2 refills | Status: DC

## 2023-08-12 NOTE — Telephone Encounter (Signed)
 Copied from CRM [id number]. Topic: Clinical - Medication Refill >> Aug 12, 2023  9:55 AM Macario HERO wrote: Medication: furosemide  (LASIX ) 20 MG tablet [[id number]]  Has the patient contacted their pharmacy? No (Agent: If no, request that the patient contact the pharmacy for the refill. If patient does not wish to contact the pharmacy document the reason why and proceed with request.) (Agent: If yes, when and what did the pharmacy advise?)  This is the patient's preferred pharmacy:  [id number] - [place], [place] - [address] [place] [address] Phone: [phone] Fax: [phone]    Is this the correct pharmacy for this prescription? Yes If no, delete pharmacy and type the correct one.   Has the prescription been filled recently? Yes  Is the patient out of the medication? Yes  Has the patient been seen for an appointment in the last year OR does the patient have an upcoming appointment? Yes  Can we respond through MyChart? No  Agent: Please be advised that Rx refills may take up to 3 business days. We ask that you follow-up with your pharmacy.

## 2023-08-13 ENCOUNTER — Telehealth: Payer: Self-pay

## 2023-08-13 NOTE — Transitions of Care (Post Inpatient/ED Visit) (Signed)
 08/13/2023  Name: Jason Fisher MRN: [id number] DOB: 1941-01-14  Today's TOC FU Call Status: Today's TOC FU Call Status:: Successful TOC FU Call Completed TOC FU Call Complete Date: 08/13/23 Patient's Name and Date of Birth confirmed.  Transition Care Management Follow-up Telephone Call Date of Discharge: 08/11/23 Discharge Facility: [facility] Type of Discharge: Emergency Department Reason for ED Visit: Other: How have you been since you were released from the hospital?: Better Any questions or concerns?: No  Items Reviewed: Did you receive and understand the discharge instructions provided?: Yes Medications obtained,verified, and reconciled?: Yes (Medications Reviewed) Any new allergies since your discharge?: No Dietary orders reviewed?: No Do you have support at home?: Yes People in Home [RPT]: spouse Name of Support/Comfort Primary Source: marlyn  Medications Reviewed Today: Medications Reviewed Today     Reviewed by Elner Shan NOVAK, CMA (Certified Medical Assistant) on 08/13/23 at 1325  Med List Status: <None>   Medication Order Taking? Sig Documenting Provider Last Dose Status Informant  ACCU-CHEK AVIVA PLUS test strip [id number] Yes CHECK BLOOD SUGAR ONCE DAILY. DX E11.51 [name], [name], MD  Active Self  apixaban  (ELIQUIS ) 2.5 MG TABS tablet [id number] Yes Take 1 tablet (2.5 mg total) by mouth 2 (two) times daily. [place], Peter M, MD  Active   atorvastatin  (LIPITOR) 40 MG tablet [id number] Yes TAKE 1 TABLET BY MOUTH EVERY DAY [name], [name], MD  Active   Budeson-Glycopyrrol-Formoterol  (BREZTRI  AEROSPHERE) 160-9-4.8 MCG/ACT TERESE [id number] Yes Inhale 2 puffs into the lungs in the morning and at bedtime. Malachy Comer GAILS, NP  Active   Budeson-Glycopyrrol-Formoterol  (BREZTRI  AEROSPHERE) 160-9-4.8 MCG/ACT AERO [id number] Yes Inhale 2 puffs into the lungs in the morning and at bedtime. Byrum, Robert S, MD  Active   citalopram  (CELEXA ) 10  MG tablet [id number] Yes Take 1 tablet (10 mg total) by mouth daily. Gregg Lek, MD  Active   Continuous Blood Gluc Receiver (DEXCOM G6 RECEIVER) DEVI [id number] Yes CHECK VLOOD SUGAR AS DIRECTED AS DIRECTED [name], [name], MD  Active Self  Continuous Blood Gluc Sensor (DEXCOM G6 SENSOR) MISC [id number] Yes APPLY 1 SENSOR EVERY 10 DAYS [name], [name], MD  Active Self  Continuous Blood Gluc Transmit (DEXCOM G6 TRANSMITTER) MISC [id number] Yes APPLY TO SENSOR EVERY 90 DAYS [name], [name], MD  Active Self  Cyanocobalamin  (B-12 COMPLIANCE INJECTION IJ) [id number] Yes Inject as directed every 30 (thirty) days. [provider]  Active   dextromethorphan  (DELSYM ) 30 MG/5ML liquid [id number] Yes Take 5 mLs (30 mg total) by mouth 2 (two) times daily as needed for cough. Vicci Afton CROME, MD  Active   diltiazem  (CARDIZEM  CD) 360 MG 24 hr capsule [id number] Yes TAKE 1 CAPSULE BY MOUTH EVERY DAY [name], [name], MD  Active   folic acid  (FOLVITE ) 1 MG tablet [id number] Yes Take 1 tablet (1 mg total) by mouth daily. Vicci Afton CROME, MD  Active   furosemide  (LASIX ) 20 MG tablet [id number] Yes Take 3 tablets (60 mg total) by mouth daily. [name], [name], MD  Active   insulin  degludec (TRESIBA  FLEXTOUCH) 100 UNIT/ML FlexTouch Pen [id number] Yes INJECT 32 UNITS INTO THE SKIN AT BEDTIME [name], [name], MD  Active   ipratropium-albuterol  (DUONEB) 0.5-2.5 (3) MG/3ML SOLN [id number] Yes Take 3 mLs by nebulization every 4 (four) hours as needed (wheezing, shortness of breath, cough). Vicci Afton CROME, MD  Active   isosorbide  mononitrate (IMDUR ) 30 MG 24 hr tablet [id number] Yes  TAKE 1 TABLET BY MOUTH EVERY DAY [place], Peter M, MD  Active   levalbuterol  (XOPENEX  HFA) 45 MCG/ACT inhaler [id number] Yes INHALE 1 TO 2 PUFFS BY MOUTH EVERY 6 HOURS AS NEEDED FOR WHEEZE [id number] Cobb, Comer GAILS, NP  Active   memantine  (NAMENDA ) 10 MG tablet [id number] Yes  Take 1 tablet (10 mg total) by mouth 2 (two) times daily. Camara, Amadou, MD  Active   metFORMIN  (GLUCOPHAGE ) 1000 MG tablet [id number] Yes TAKE ONE TABLET BY MOUTH AT [facility] AND AT BEDTIME [name], [name], MD  Active   metoprolol  succinate (TOPROL  XL) 50 MG 24 hr tablet [id number] Yes Take 1 tablet (50 mg total) by mouth daily. Take with or immediately following a meal. [place], Peter M, MD  Active   nitroGLYCERIN  (NITROSTAT ) 0.4 MG SL tablet [id number] Yes Place 1 tablet (0.4 mg total) under the tongue every 5 (five) minutes as needed for chest pain. X 3 doses Hernandez Acosta, Estela Y, MD  Active Self  polyethylene glycol (MIRALAX  / GLYCOLAX ) packet [id number] Yes Take 17 g by mouth daily as needed for mild constipation. [provider]  Active Self  QUEtiapine  (SEROQUEL ) 25 MG tablet [id number] Yes TAKE 0.5 TABLETS BY MOUTH AT BEDTIME. [name], [name], MD  Active   rivastigmine  (EXELON ) 3 MG capsule [id number] Yes Take 1 capsule (3 mg total) by mouth 2 (two) times daily. Camara, Amadou, MD  Active   tamsulosin  (FLOMAX ) 0.4 MG CAPS capsule [id number] Yes Take 1 capsule (0.4 mg total) by mouth daily. Palumbo, April, MD  Active             Home Care and Equipment/Supplies: Were Home Health Services Ordered?: No Any new equipment or medical supplies ordered?: No  Functional Questionnaire: Do you need assistance with bathing/showering or dressing?: No Do you need assistance with meal preparation?: No Do you need assistance with eating?: No Do you have difficulty maintaining continence: No Do you need assistance with getting out of bed/getting out of a chair/moving?: No Do you have difficulty managing or taking your medications?: No  Follow up appointments reviewed: PCP Follow-up appointment confirmed?: NA Specialist Hospital Follow-up appointment confirmed?: Yes Date of Specialist follow-up appointment?: 08/20/23 Follow-Up Specialty Provider:: urology Do you  need transportation to your follow-up appointment?: No Do you understand care options if your condition(s) worsen?: Yes-patient verbalized understanding    SIGNATURE Ivannah Zody,CMA

## 2023-08-18 ENCOUNTER — Telehealth: Payer: Self-pay | Admitting: Internal Medicine

## 2023-08-31 NOTE — Progress Notes (Deleted)
 Cardiology Office Note    Date:  08/31/2023   ID:  Jason Fisher, DOB 09-May-1940, MRN [id number]  PCP:  [name], [name], MD  Cardiologist:  Dr. [place]aziland   No chief complaint on file.   History of Present Illness:  Jason Fisher is a 83 y.o. male seen for follow up CAD and Afib. He has a PMH of CAD (s/p BMS to RCA 2007, ISR with DES to RCA in 2009, DES to OM2 in 09/2015), chronic atrial fibrillation on Eliquis , chronic diastolic heart failure, HTN, HLD, AAA, COPD, and tobacco use.  He was previously admitted in March 2018 for atrial fibrillation with RVR.  Troponin was borderline elevated.  He was initially treated with IV Cardizem  and later switched to p.o. Cardizem  prior to discharge.  He also underwent AAA endovascular stent grafting by Dr. Serene in June 2018.  He was seen in October 2019 by Scot Ford PA-C for increased edema.  Lasix  was increased to 80 mg twice daily for 3 days before decreasing back down to 40 mg twice daily thereafter.  Echocardiogram on 11/05/2017 this showed EF 60 to 65%, mildly increased aortic root measuring 40 mm, moderately dilated left and the right atrium.   He was admitted in April 2022 with worsening dyspnea on exertion.  While in the emergency room, blood pressure was 207/109.  He was admitted for acute on chronic diastolic heart failure and Afib with RVR.  Echocardiogram obtained during that admission showed EF 50 to 55%, no regional wall motion abnormality, moderate LVH.  He was eventually released on the prior home dose of Lasix  80 mg daily.  Cardizem  was added back every 6 hours and he was eventually transitioned to  Cardizem  CD.  AAA ultrasound obtained in June 2022 showed a stable sac measuring 3.98 in transverse dimension. When seen in September Afib rate was controlled. TFTs were normal.  On follow up today he has chronic SOB. He is not smoking. Denies any edema and weight is down 7 lbs. Notes feeling dizzy at times and fatigued. No chest pain or  palpitations.    Past Medical History:  Diagnosis Date   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 2007 Inf STEMI s/p Vision BMS  2 to RCA;  b. 2009 ISR Prox RCA Stent-->with DES;  c. 09/2015 PCI: LM nl, LAD 30p/m, D1 90, LCX nl, OM1 small, 80, OM2 90 (2.5x14 Biofreedom stent), OM3 small, RCA 30p ISR, 33m ISR, 12m, 85d, EF 50-55%.   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 On Eliquis  (CHA2DS2VASc = 5).  Rate controlled w/ BB and CCB.   Chronic diastolic CHF (congestive heart failure) (HCC)    a. 09/2015 Echo: EF 55-60%, mildly dil LA.   CKD (chronic kidney disease), stage III (HCC)    they mentioned this to the patient but they would work on it later after the aneurysm   COLONIC POLYPS, HX OF 11/02/2006   COPD (chronic obstructive pulmonary disease) (HCC)    Dementia (HCC)    DIABETES MELLITUS, TYPE II 10/29/2006   Diverticulosis    History of tobacco abuse    a. Quit 09/2015.   HYPERLIPIDEMIA 10/29/2006   Hypertension    Hypertensive heart disease with heart failure (HCC)    Pneumonia 09/2015   Jason Fisher   STEMI (ST elevation myocardial infarction) (HCC) 2007   /  notes Fisher    Past Surgical History:  Procedure Laterality Date   ABDOMINAL AORTIC ENDOVASCULAR STENT GRAFT N/A Fisher   Procedure: ABDOMINAL AORTIC ENDOVASCULAR STENT GRAFT;  Surgeon: Serene Gaile ORN, MD;  Location: [facility] OR;  Service: Vascular;  Laterality: N/A;   CARDIAC CATHETERIZATION N/A 09/26/2015   Procedure: Left Heart Cath and Coronary Angiography;  Surgeon: Taiylor Virden M Sw[place], MD;  Location: [facility] INVASIVE CV LAB;  Service: Cardiovascular;  Laterality: N/A;   CARDIAC CATHETERIZATION N/A 09/26/2015   Procedure: Coronary Stent Intervention;  Surgeon: Shynice Sigel M [place], MD;  Location: [facility] INVASIVE CV LAB;  Service: Cardiovascular;  Laterality: N/A;   CATARACT EXTRACTION Bilateral    COLONOSCOPY      CORONARY ANGIOPLASTY WITH STENT PLACEMENT  2007   Inferior STEMI 2007 - RCA PCI with 2 Vision BMS; Abnormal Myoview in 2009 - pRCA ins-stent restenosis & unstented disease - DES PCI Promus 3.0 x 20 mm (3.5 mm)   ENDOVASCULAR REPAIR/STENT GRAFT  07/31/2016   INCISION AND DRAINAGE PERIRECTAL ABSCESS N/A 11/06/2015   Procedure: IRRIGATION AND DEBRIDEMENT PERIRECTAL ABSCESS, POSSIBLE HEMORRHOIDECTOMY;  Surgeon: Vicenta Poli, MD;  Location: [facility] OR;  Service: General;  Laterality: N/A;   ORIF SHOULDER FRACTURE Right    fell out of back end of a truck    Current Medications: Outpatient Medications Prior to Visit  Medication Sig Dispense Refill   ACCU-CHEK AVIVA PLUS test strip CHECK BLOOD SUGAR ONCE DAILY. DX E11.51 100 strip 2   apixaban  (ELIQUIS ) 2.5 MG TABS tablet Take 1 tablet (2.5 mg total) by mouth 2 (two) times daily. 180 tablet 1   atorvastatin  (LIPITOR) 40 MG tablet TAKE 1 TABLET BY MOUTH EVERY DAY 90 tablet 2   Budeson-Glycopyrrol-Formoterol  (BREZTRI  AEROSPHERE) 160-9-4.8 MCG/ACT AERO Inhale 2 puffs into the lungs in the morning and at bedtime. 10.7 g 12   Budeson-Glycopyrrol-Formoterol  (BREZTRI  AEROSPHERE) 160-9-4.8 MCG/ACT AERO Inhale 2 puffs into the lungs in the morning and at bedtime.     citalopram  (CELEXA ) 10 MG tablet Take 1 tablet (10 mg total) by mouth daily. 90 tablet 3   Continuous Blood Gluc Receiver (DEXCOM G6 RECEIVER) DEVI CHECK VLOOD SUGAR AS DIRECTED AS DIRECTED 1 each 0   Continuous Blood Gluc Sensor (DEXCOM G6 SENSOR) MISC APPLY 1 SENSOR EVERY 10 DAYS 3 each 2   Continuous Blood Gluc Transmit (DEXCOM G6 TRANSMITTER) MISC APPLY TO SENSOR EVERY 90 DAYS 1 each 0   Cyanocobalamin  (B-12 COMPLIANCE INJECTION IJ) Inject as directed every 30 (thirty) days.     dextromethorphan  (DELSYM ) 30 MG/5ML liquid Take 5 mLs (30 mg total) by mouth 2 (two) times daily as needed for cough. 89 mL 1   diltiazem  (CARDIZEM  CD) 360 MG 24 hr capsule TAKE 1 CAPSULE BY MOUTH EVERY DAY 90 capsule  2   folic acid  (FOLVITE ) 1 MG tablet Take 1 tablet (1 mg total) by mouth daily. 30 tablet 1   furosemide  (LASIX ) 20 MG tablet Take 3 tablets (60 mg total) by mouth daily. 90 tablet 2   insulin  degludec (TRESIBA  FLEXTOUCH) 100 UNIT/ML FlexTouch Pen INJECT 32 UNITS INTO THE SKIN AT BEDTIME 15 mL 2   ipratropium-albuterol  (DUONEB) 0.5-2.5 (3) MG/3ML SOLN Take 3 mLs by nebulization every 4 (four) hours as needed (wheezing, shortness of breath, cough). 360 mL 1   isosorbide  mononitrate (IMDUR ) 30 MG 24 hr tablet TAKE 1 TABLET BY MOUTH EVERY DAY 90 tablet 2   levalbuterol  (XOPENEX  HFA) 45 MCG/ACT inhaler INHALE 1 TO 2 PUFFS BY MOUTH EVERY  6 HOURS AS NEEDED FOR WHEEZE DXJ42 15 each 3   memantine  (NAMENDA ) 10 MG tablet Take 1 tablet (10 mg total) by mouth 2 (two) times daily. 180 tablet 3   metFORMIN  (GLUCOPHAGE ) 1000 MG tablet TAKE ONE TABLET BY MOUTH AT BREAKFAST AND AT BEDTIME 180 tablet 0   metoprolol  succinate (TOPROL  XL) 50 MG 24 hr tablet Take 1 tablet (50 mg total) by mouth daily. Take with or immediately following a meal. 90 tablet 3   nitroGLYCERIN  (NITROSTAT ) 0.4 MG SL tablet Place 1 tablet (0.4 mg total) under the tongue every 5 (five) minutes as needed for chest pain. X 3 doses 25 tablet 4   polyethylene glycol (MIRALAX  / GLYCOLAX ) packet Take 17 g by mouth daily as needed for mild constipation.     QUEtiapine  (SEROQUEL ) 25 MG tablet TAKE 0.5 TABLETS BY MOUTH AT BEDTIME. 45 tablet 2   rivastigmine  (EXELON ) 3 MG capsule Take 1 capsule (3 mg total) by mouth 2 (two) times daily. 180 capsule 3   tamsulosin  (FLOMAX ) 0.4 MG CAPS capsule Take 1 capsule (0.4 mg total) by mouth daily. 30 capsule 0   No facility-administered medications prior to visit.     Allergies:   Contrast media [iodinated contrast media]   Social History   Socioeconomic History   Marital status: Married    Spouse name: Not on file   Number of children: 5   Years of education: Not on file   Highest education level: Not on  file  Occupational History   Occupation: [employer]  Tobacco Use   Smoking status: Former    Current packs/day: 0.00    Average packs/day: 1 pack/day for 49.0 years (49.0 ttl pk-yrs)    Types: Cigarettes    Start date: 09/08/1966    Quit date: 09/08/2015    Years since quitting: 7.9    Passive exposure: Current   Smokeless tobacco: Never   Tobacco comments:    passive smoker from 2017-2024  Vaping Use   Vaping status: Never Used  Substance and Sexual Activity   Alcohol use: No    Alcohol/week: 0.0 standard drinks of alcohol   Drug use: No   Sexual activity: Yes    Birth control/protection: None  Other Topics Concern   Not on file  Social History Narrative   Not on file   Social Drivers of Health   Financial Resource Strain: Medium Risk (09/23/2020)   Overall Financial Resource Strain (CARDIA)    Difficulty of Paying Living Expenses: Somewhat hard  Food Insecurity: No Food Insecurity (05/05/2022)   Hunger Vital Sign    Worried About Running Out of Food in the Last Year: Never true    Ran Out of Food in the Last Year: Never true  Transportation Needs: No Transportation Needs (05/05/2022)   PRAPARE - Administrator, Civil Service (Medical): No    Lack of Transportation (Non-Medical): No  Physical Activity: Sufficiently Active (06/08/2019)   Exercise Vital Sign    Days of Exercise per Week: 3 days    Minutes of Exercise per Session: 60 min  Stress: Not on file  Social Connections: Not on file     Family History:  The patient's family history includes Diabetes in his brother and mother; Heart attack in his brother, father, and mother; Hypertension in his father.   ROS:   Please see the history of present illness.    ROS All other systems reviewed and are negative.   PHYSICAL EXAM:  VS:  There were no vitals taken for this visit.   GENERAL:  Chronically ill appearing WM in NAD HEENT:  PERRL, EOMI, sclera are clear. Oropharynx is clear. NECK:  No jugular venous  distention, carotid upstroke brisk and symmetric, no bruits, no thyromegaly or adenopathy LUNGS:  diminished BS bilaterally no active wheezing. CHEST:  Unremarkable HEART:  IRRR, rate slow,  PMI not displaced or sustained,S1 and S2 within normal limits, no S3, no S4: no clicks, no rubs, no murmurs ABD:  Soft, nontender. BS +, no masses or bruits. No hepatomegaly, no splenomegaly EXT:  2 + pulses throughout, no edema, no cyanosis no clubbing SKIN:  Warm and dry.  No rashes NEURO:  Alert and oriented x 3. Cranial nerves II through XII intact. PSYCH:  Cognitively intact    Wt Readings from Last 3 Encounters:  08/11/23 152 lb 11.4 oz (69.3 kg)  07/26/23 153 lb 8 oz (69.6 kg)  05/10/23 157 lb 8 oz (71.4 kg)    No BP recorded.  {Refresh Note OR Click here to enter BP  :1}***    Studies/Labs Reviewed:   EKG:  EKG is not  ordered today.      Recent Labs: 05/03/2023: ALT 23 05/08/2023: B Natriuretic Peptide 238.0 08/11/2023: BUN 19; Creatinine, Ser 1.91; Hemoglobin 11.1; Platelets 203; Potassium 4.3; Sodium 144   Lipid Panel    Component Value Date/Time   CHOL 135 03/10/2023 1108   TRIG 106 03/10/2023 1108   HDL 43 03/10/2023 1108   CHOLHDL 3.1 03/10/2023 1108   CHOLHDL 3 02/10/2021 1404   VLDL 18.8 02/10/2021 1404   LDLCALC 72 03/10/2023 1108    Additional studies/ records that were reviewed today include:   Cath 09/26/2015 Prox LAD to Mid LAD lesion, 30 %stenosed. 1st Diag lesion, 90 %stenosed. Ost 1st Mrg to 1st Mrg lesion, 80 %stenosed. Ost RCA to Prox RCA lesion, 30 %stenosed. Mid RCA-2 lesion, 40 %stenosed. Mid RCA-1 lesion, 15 %stenosed. 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 There is mild left ventricular systolic dysfunction. The left ventricular ejection fraction is 50-55% by visual estimate. LV end diastolic pressure is moderately elevated. A drug eluting stent was successfully placed. 2nd Mrg lesion, 90 %stenosed. Post intervention,  there is a 0% residual stenosis.   1. 3 vessel obstructive CAD.     - chronic severe stenosis in the first diagonal    - New 90% stenosis in a large second OM    - Stents in the proximal and mid RCA are patent    - Aneurysm and stenosis in the PLOM branch of the RCA- the aneurysm is new compared to 2009 2. Mild LV dysfunction 3. Elevated LVEDP 4. Allergic response to contrast with hives and bronchospasm. 5. Successful stenting of the second OM with a Biofreedom stent.    Plan: continue IV diuresis. Patient received steroids, Benadryl , and IV pepcid  for contrast reaction. Continue DAPT for one month then DC ASA. Resume Eliquis  in am.        Echo 11/05/2017 LV EF: 60% -   65% Study Conclusions   - Left ventricle: The cavity size was normal. There was moderate   concentric hypertrophy. Systolic function was normal. The   estimated ejection fraction was in the range of 60% to 65%. Wall   motion was normal; there were no regional wall motion   abnormalities. The study was not technically sufficient to allow   evaluation of LV diastolic dysfunction due  to atrial   fibrillation. - Aortic valve: Poorly visualized. Trileaflet; moderately   thickened, moderately calcified leaflets. - Aorta: Aortic root dimension: 40 mm (ED). Ascending aortic   diameter: 38 mm (S). - Aortic root: The aortic root was mildly dilated. - Ascending aorta: The ascending aorta was mildly dilated. - Left atrium: The atrium was moderately dilated. - Right atrium: The atrium was moderately dilated. - Pulmonic valve: There was mild regurgitation. - Pulmonary arteries: Systolic pressure could not be accurately   estimated.     Echo 05/11/2020  1. Left ventricular ejection fraction, by estimation, is 50 to 55%. The  left ventricle has low normal function. The left ventricle has no regional  wall motion abnormalities. There is moderate left ventricular hypertrophy.  Left ventricular diastolic  function could not be  evaluated.   2. Right ventricular systolic function is normal. The right ventricular  size is normal. There is normal pulmonary artery systolic pressure.   3. Left atrial size was mildly dilated.   4. The mitral valve is grossly normal. Trivial mitral valve  regurgitation.   5. The aortic valve is grossly normal. Aortic valve regurgitation is  trivial.   6. The inferior vena cava is dilated in size with >50% respiratory  variability, suggesting right atrial pressure of 8 mmHg.     ASSESSMENT:    No diagnosis found.      PLAN:  In order of problems listed above:  Chronic atrial fibrillation. Rate is slow. On metoprolol  tartrate 100 mg daily and Cardizem  360 mg daily. Will switch metoprolol  to succinate at reduced dose of 50 mg daily.   No palpitations. Continue Eliquis .   CAD: s/p multiple interventions in the past- last in 2017. On diltiazem , isosorbide  and lopressor . On statin. Noted some chest tightness before that has improved with addition of  Imdur  30 mg daily. No significant angina. Dyspnea more related to COPD.   Chronic diastolic CHF: volume status is good on current lasix  dose  80 mg daily. Recommend reducing to 40 mg daily due to dizziness. Monitor weight and swelling closely  Hyperlipidemia: On Lipitor 40 mg daily.  Will update labs today  History of AAA repair: followed by [facility].   6..  DM type 2 on metformin .   7.   Severe COPD - ongoing SOB.  follow up with pulmonary  8. HTN. BP is well controlled.   Follow up in 6 months   Medication Adjustments/Labs and Tests Ordered: Current medicines are reviewed at length with the patient today.  Concerns regarding medicines are outlined above.  Medication changes, Labs and Tests ordered today are listed in the Patient Instructions below. There are no Patient Instructions on file for this visit.    Signed, Adilynne Fitzwater Sw[place]and, MD  08/31/2023 3:01 PM    [facility] [address] [place], [place], [place]   [address] Phone: [phone]; Fax: [phone]

## 2023-09-03 NOTE — Telephone Encounter (Signed)
 Copied from CRM [id number]. Topic: General - Deceased Patient >> Aug 21, 2023 10:45 AM Drema MATSU wrote: Name of caller: Vinie Arvin  Date of death: 08/21/23 9:58 a.m.  Name of funeral home: [facility]   Phone number of funeral home: [phone]  Provider that needs to sign form: Death Certificate and [facility] will be contacting pcp directly   Timeline for signing: 48 hours

## 2023-09-03 DEATH — deceased

## 2023-09-09 ENCOUNTER — Ambulatory Visit: Admitting: Cardiology
